# Patient Record
Sex: Male | Born: 1955 | Race: Black or African American | Hispanic: No | Marital: Single | Smoking: Never smoker
Health system: Southern US, Community
[De-identification: ages and names within clinical notes are randomized; demographics above are authoritative.]

## PROBLEM LIST (undated history)

## (undated) DIAGNOSIS — G4733 Obstructive sleep apnea (adult) (pediatric): Secondary | ICD-10-CM

## (undated) DIAGNOSIS — E785 Hyperlipidemia, unspecified: Secondary | ICD-10-CM

## (undated) DIAGNOSIS — E039 Hypothyroidism, unspecified: Secondary | ICD-10-CM

## (undated) DIAGNOSIS — D6489 Other specified anemias: Secondary | ICD-10-CM

## (undated) DIAGNOSIS — S065XAA Traumatic subdural hemorrhage with loss of consciousness status unknown, initial encounter: Secondary | ICD-10-CM

## (undated) DIAGNOSIS — I219 Acute myocardial infarction, unspecified: Secondary | ICD-10-CM

## (undated) DIAGNOSIS — IMO0001 Reserved for inherently not codable concepts without codable children: Secondary | ICD-10-CM

## (undated) DIAGNOSIS — M545 Low back pain, unspecified: Secondary | ICD-10-CM

## (undated) DIAGNOSIS — I10 Essential (primary) hypertension: Secondary | ICD-10-CM

## (undated) DIAGNOSIS — H409 Unspecified glaucoma: Secondary | ICD-10-CM

## (undated) DIAGNOSIS — E11319 Type 2 diabetes mellitus with unspecified diabetic retinopathy without macular edema: Secondary | ICD-10-CM

## (undated) DIAGNOSIS — E1140 Type 2 diabetes mellitus with diabetic neuropathy, unspecified: Secondary | ICD-10-CM

## (undated) DIAGNOSIS — Z992 Dependence on renal dialysis: Secondary | ICD-10-CM

## (undated) DIAGNOSIS — Z8739 Personal history of other diseases of the musculoskeletal system and connective tissue: Secondary | ICD-10-CM

## (undated) DIAGNOSIS — I4891 Unspecified atrial fibrillation: Secondary | ICD-10-CM

## (undated) DIAGNOSIS — M1990 Unspecified osteoarthritis, unspecified site: Secondary | ICD-10-CM

## (undated) DIAGNOSIS — G8929 Other chronic pain: Secondary | ICD-10-CM

## (undated) DIAGNOSIS — I509 Heart failure, unspecified: Secondary | ICD-10-CM

## (undated) DIAGNOSIS — N186 End stage renal disease: Secondary | ICD-10-CM

## (undated) DIAGNOSIS — J309 Allergic rhinitis, unspecified: Secondary | ICD-10-CM

## (undated) DIAGNOSIS — I639 Cerebral infarction, unspecified: Secondary | ICD-10-CM

## (undated) DIAGNOSIS — I272 Pulmonary hypertension, unspecified: Secondary | ICD-10-CM

## (undated) DIAGNOSIS — I499 Cardiac arrhythmia, unspecified: Secondary | ICD-10-CM

## (undated) DIAGNOSIS — Z531 Procedure and treatment not carried out because of patient's decision for reasons of belief and group pressure: Secondary | ICD-10-CM

## (undated) DIAGNOSIS — S065X9A Traumatic subdural hemorrhage with loss of consciousness of unspecified duration, initial encounter: Secondary | ICD-10-CM

## (undated) DIAGNOSIS — K219 Gastro-esophageal reflux disease without esophagitis: Secondary | ICD-10-CM

## (undated) HISTORY — DX: Obstructive sleep apnea (adult) (pediatric): G47.33

## (undated) HISTORY — DX: Heart failure, unspecified: I50.9

## (undated) HISTORY — DX: Morbid (severe) obesity due to excess calories: E66.01

## (undated) HISTORY — PX: JOINT REPLACEMENT: SHX530

## (undated) HISTORY — DX: Cerebral infarction, unspecified: I63.9

## (undated) HISTORY — DX: Allergic rhinitis, unspecified: J30.9

## (undated) HISTORY — DX: Type 2 diabetes mellitus with unspecified diabetic retinopathy without macular edema: E11.319

## (undated) HISTORY — DX: Other specified anemias: D64.89

## (undated) HISTORY — DX: Hyperlipidemia, unspecified: E78.5

## (undated) HISTORY — PX: CLOSED REDUCTION HIP DISLOCATION: SUR221

## (undated) HISTORY — DX: Unspecified glaucoma: H40.9

## (undated) HISTORY — DX: Unspecified osteoarthritis, unspecified site: M19.90

## (undated) HISTORY — DX: End stage renal disease: N18.6

## (undated) HISTORY — DX: Pulmonary hypertension, unspecified: I27.20

## (undated) HISTORY — DX: Unspecified atrial fibrillation: I48.91

## (undated) HISTORY — DX: Essential (primary) hypertension: I10

## (undated) HISTORY — PX: AV FISTULA PLACEMENT: SHX1204

## (undated) HISTORY — DX: Type 2 diabetes mellitus with diabetic neuropathy, unspecified: E11.40

## (undated) HISTORY — DX: Dependence on renal dialysis: Z99.2

---

## 1978-02-12 HISTORY — PX: TOTAL HIP ARTHROPLASTY: SHX124

## 2008-07-06 LAB — PROTIME-INR: INR: 2.2 — AB (ref 0.9–1.1)

## 2011-02-14 DIAGNOSIS — G4733 Obstructive sleep apnea (adult) (pediatric): Secondary | ICD-10-CM | POA: Diagnosis not present

## 2011-02-14 DIAGNOSIS — J449 Chronic obstructive pulmonary disease, unspecified: Secondary | ICD-10-CM | POA: Diagnosis not present

## 2011-02-14 DIAGNOSIS — E669 Obesity, unspecified: Secondary | ICD-10-CM | POA: Diagnosis not present

## 2011-02-14 DIAGNOSIS — J209 Acute bronchitis, unspecified: Secondary | ICD-10-CM | POA: Diagnosis not present

## 2011-02-15 DIAGNOSIS — Z7901 Long term (current) use of anticoagulants: Secondary | ICD-10-CM | POA: Diagnosis not present

## 2011-02-28 DIAGNOSIS — J9610 Chronic respiratory failure, unspecified whether with hypoxia or hypercapnia: Secondary | ICD-10-CM | POA: Diagnosis not present

## 2011-02-28 DIAGNOSIS — J449 Chronic obstructive pulmonary disease, unspecified: Secondary | ICD-10-CM | POA: Diagnosis not present

## 2011-02-28 DIAGNOSIS — J45909 Unspecified asthma, uncomplicated: Secondary | ICD-10-CM | POA: Diagnosis not present

## 2011-03-01 DIAGNOSIS — I509 Heart failure, unspecified: Secondary | ICD-10-CM | POA: Diagnosis not present

## 2011-03-01 DIAGNOSIS — Z7901 Long term (current) use of anticoagulants: Secondary | ICD-10-CM | POA: Diagnosis not present

## 2011-03-05 DIAGNOSIS — I4891 Unspecified atrial fibrillation: Secondary | ICD-10-CM | POA: Diagnosis not present

## 2011-03-05 DIAGNOSIS — I119 Hypertensive heart disease without heart failure: Secondary | ICD-10-CM | POA: Diagnosis not present

## 2011-03-05 DIAGNOSIS — R5383 Other fatigue: Secondary | ICD-10-CM | POA: Diagnosis not present

## 2011-03-05 DIAGNOSIS — I209 Angina pectoris, unspecified: Secondary | ICD-10-CM | POA: Diagnosis not present

## 2011-03-05 DIAGNOSIS — R5381 Other malaise: Secondary | ICD-10-CM | POA: Diagnosis not present

## 2011-03-05 DIAGNOSIS — I2510 Atherosclerotic heart disease of native coronary artery without angina pectoris: Secondary | ICD-10-CM | POA: Diagnosis not present

## 2011-03-05 DIAGNOSIS — R0602 Shortness of breath: Secondary | ICD-10-CM | POA: Diagnosis not present

## 2011-03-05 DIAGNOSIS — I509 Heart failure, unspecified: Secondary | ICD-10-CM | POA: Diagnosis not present

## 2011-03-07 DIAGNOSIS — I509 Heart failure, unspecified: Secondary | ICD-10-CM | POA: Diagnosis present

## 2011-03-07 DIAGNOSIS — E119 Type 2 diabetes mellitus without complications: Secondary | ICD-10-CM | POA: Diagnosis present

## 2011-03-07 DIAGNOSIS — I129 Hypertensive chronic kidney disease with stage 1 through stage 4 chronic kidney disease, or unspecified chronic kidney disease: Secondary | ICD-10-CM | POA: Diagnosis present

## 2011-03-07 DIAGNOSIS — E669 Obesity, unspecified: Secondary | ICD-10-CM | POA: Diagnosis present

## 2011-03-07 DIAGNOSIS — N189 Chronic kidney disease, unspecified: Secondary | ICD-10-CM | POA: Diagnosis present

## 2011-03-07 DIAGNOSIS — M069 Rheumatoid arthritis, unspecified: Secondary | ICD-10-CM | POA: Diagnosis present

## 2011-03-07 DIAGNOSIS — E7800 Pure hypercholesterolemia, unspecified: Secondary | ICD-10-CM | POA: Diagnosis present

## 2011-03-07 DIAGNOSIS — I4891 Unspecified atrial fibrillation: Secondary | ICD-10-CM | POA: Diagnosis not present

## 2011-03-07 DIAGNOSIS — I5032 Chronic diastolic (congestive) heart failure: Secondary | ICD-10-CM | POA: Diagnosis present

## 2011-03-07 DIAGNOSIS — Z96649 Presence of unspecified artificial hip joint: Secondary | ICD-10-CM | POA: Diagnosis not present

## 2011-03-15 DIAGNOSIS — Z7901 Long term (current) use of anticoagulants: Secondary | ICD-10-CM | POA: Diagnosis not present

## 2011-03-29 DIAGNOSIS — I4891 Unspecified atrial fibrillation: Secondary | ICD-10-CM | POA: Diagnosis not present

## 2011-03-29 DIAGNOSIS — Z7901 Long term (current) use of anticoagulants: Secondary | ICD-10-CM | POA: Diagnosis not present

## 2011-04-12 DIAGNOSIS — Z7901 Long term (current) use of anticoagulants: Secondary | ICD-10-CM | POA: Diagnosis not present

## 2011-04-12 DIAGNOSIS — I4891 Unspecified atrial fibrillation: Secondary | ICD-10-CM | POA: Diagnosis not present

## 2011-04-25 DIAGNOSIS — I4891 Unspecified atrial fibrillation: Secondary | ICD-10-CM | POA: Diagnosis not present

## 2011-04-26 DIAGNOSIS — I509 Heart failure, unspecified: Secondary | ICD-10-CM | POA: Diagnosis not present

## 2011-04-26 DIAGNOSIS — I4891 Unspecified atrial fibrillation: Secondary | ICD-10-CM | POA: Diagnosis not present

## 2011-04-26 DIAGNOSIS — J811 Chronic pulmonary edema: Secondary | ICD-10-CM | POA: Diagnosis not present

## 2011-04-26 DIAGNOSIS — I999 Unspecified disorder of circulatory system: Secondary | ICD-10-CM | POA: Diagnosis not present

## 2011-04-26 DIAGNOSIS — I517 Cardiomegaly: Secondary | ICD-10-CM | POA: Diagnosis not present

## 2011-04-26 DIAGNOSIS — R9431 Abnormal electrocardiogram [ECG] [EKG]: Secondary | ICD-10-CM | POA: Diagnosis not present

## 2011-04-27 DIAGNOSIS — I999 Unspecified disorder of circulatory system: Secondary | ICD-10-CM | POA: Diagnosis not present

## 2011-04-27 DIAGNOSIS — I259 Chronic ischemic heart disease, unspecified: Secondary | ICD-10-CM | POA: Diagnosis not present

## 2011-05-10 DIAGNOSIS — Z7901 Long term (current) use of anticoagulants: Secondary | ICD-10-CM | POA: Diagnosis not present

## 2011-05-14 DIAGNOSIS — I509 Heart failure, unspecified: Secondary | ICD-10-CM | POA: Diagnosis not present

## 2011-05-14 DIAGNOSIS — J209 Acute bronchitis, unspecified: Secondary | ICD-10-CM | POA: Diagnosis not present

## 2011-05-15 DIAGNOSIS — R059 Cough, unspecified: Secondary | ICD-10-CM | POA: Diagnosis not present

## 2011-05-15 DIAGNOSIS — R05 Cough: Secondary | ICD-10-CM | POA: Diagnosis not present

## 2011-05-16 DIAGNOSIS — N183 Chronic kidney disease, stage 3 unspecified: Secondary | ICD-10-CM | POA: Diagnosis not present

## 2011-05-16 DIAGNOSIS — E119 Type 2 diabetes mellitus without complications: Secondary | ICD-10-CM | POA: Diagnosis not present

## 2011-05-16 DIAGNOSIS — I10 Essential (primary) hypertension: Secondary | ICD-10-CM | POA: Diagnosis not present

## 2011-05-16 DIAGNOSIS — I509 Heart failure, unspecified: Secondary | ICD-10-CM | POA: Diagnosis not present

## 2011-05-16 DIAGNOSIS — I4891 Unspecified atrial fibrillation: Secondary | ICD-10-CM | POA: Diagnosis not present

## 2011-05-17 DIAGNOSIS — I10 Essential (primary) hypertension: Secondary | ICD-10-CM | POA: Diagnosis not present

## 2011-05-17 DIAGNOSIS — I4891 Unspecified atrial fibrillation: Secondary | ICD-10-CM | POA: Diagnosis not present

## 2011-05-23 DIAGNOSIS — I4891 Unspecified atrial fibrillation: Secondary | ICD-10-CM | POA: Diagnosis not present

## 2011-05-23 DIAGNOSIS — Z7901 Long term (current) use of anticoagulants: Secondary | ICD-10-CM | POA: Diagnosis not present

## 2011-06-07 DIAGNOSIS — Z7901 Long term (current) use of anticoagulants: Secondary | ICD-10-CM | POA: Diagnosis not present

## 2011-06-27 DIAGNOSIS — Z7901 Long term (current) use of anticoagulants: Secondary | ICD-10-CM | POA: Diagnosis not present

## 2011-07-31 DIAGNOSIS — H109 Unspecified conjunctivitis: Secondary | ICD-10-CM | POA: Diagnosis not present

## 2011-07-31 DIAGNOSIS — H4010X0 Unspecified open-angle glaucoma, stage unspecified: Secondary | ICD-10-CM | POA: Diagnosis not present

## 2011-08-14 DIAGNOSIS — H4010X0 Unspecified open-angle glaucoma, stage unspecified: Secondary | ICD-10-CM | POA: Diagnosis not present

## 2011-08-14 DIAGNOSIS — H109 Unspecified conjunctivitis: Secondary | ICD-10-CM | POA: Diagnosis not present

## 2011-08-14 DIAGNOSIS — L708 Other acne: Secondary | ICD-10-CM | POA: Diagnosis not present

## 2011-08-15 DIAGNOSIS — I4891 Unspecified atrial fibrillation: Secondary | ICD-10-CM | POA: Diagnosis not present

## 2011-09-05 DIAGNOSIS — I4891 Unspecified atrial fibrillation: Secondary | ICD-10-CM | POA: Diagnosis not present

## 2011-09-05 DIAGNOSIS — Z7901 Long term (current) use of anticoagulants: Secondary | ICD-10-CM | POA: Diagnosis not present

## 2011-09-26 DIAGNOSIS — Z7901 Long term (current) use of anticoagulants: Secondary | ICD-10-CM | POA: Diagnosis not present

## 2011-10-04 DIAGNOSIS — I4891 Unspecified atrial fibrillation: Secondary | ICD-10-CM | POA: Diagnosis not present

## 2011-10-04 DIAGNOSIS — I509 Heart failure, unspecified: Secondary | ICD-10-CM | POA: Diagnosis not present

## 2011-10-05 DIAGNOSIS — N183 Chronic kidney disease, stage 3 unspecified: Secondary | ICD-10-CM | POA: Diagnosis not present

## 2011-10-05 DIAGNOSIS — E1129 Type 2 diabetes mellitus with other diabetic kidney complication: Secondary | ICD-10-CM | POA: Diagnosis not present

## 2011-10-05 DIAGNOSIS — I10 Essential (primary) hypertension: Secondary | ICD-10-CM | POA: Diagnosis not present

## 2011-10-05 DIAGNOSIS — I4891 Unspecified atrial fibrillation: Secondary | ICD-10-CM | POA: Diagnosis not present

## 2011-10-11 DIAGNOSIS — E8809 Other disorders of plasma-protein metabolism, not elsewhere classified: Secondary | ICD-10-CM | POA: Diagnosis not present

## 2011-10-11 DIAGNOSIS — N183 Chronic kidney disease, stage 3 unspecified: Secondary | ICD-10-CM | POA: Diagnosis not present

## 2011-10-11 DIAGNOSIS — I10 Essential (primary) hypertension: Secondary | ICD-10-CM | POA: Diagnosis not present

## 2011-10-11 DIAGNOSIS — E119 Type 2 diabetes mellitus without complications: Secondary | ICD-10-CM | POA: Diagnosis not present

## 2011-10-11 DIAGNOSIS — N179 Acute kidney failure, unspecified: Secondary | ICD-10-CM | POA: Diagnosis not present

## 2011-10-11 DIAGNOSIS — I509 Heart failure, unspecified: Secondary | ICD-10-CM | POA: Diagnosis not present

## 2011-10-17 DIAGNOSIS — I10 Essential (primary) hypertension: Secondary | ICD-10-CM | POA: Diagnosis not present

## 2011-10-17 DIAGNOSIS — E119 Type 2 diabetes mellitus without complications: Secondary | ICD-10-CM | POA: Diagnosis not present

## 2011-10-17 DIAGNOSIS — I509 Heart failure, unspecified: Secondary | ICD-10-CM | POA: Diagnosis not present

## 2011-10-17 DIAGNOSIS — I4891 Unspecified atrial fibrillation: Secondary | ICD-10-CM | POA: Diagnosis not present

## 2011-10-17 DIAGNOSIS — N183 Chronic kidney disease, stage 3 unspecified: Secondary | ICD-10-CM | POA: Diagnosis not present

## 2011-10-17 DIAGNOSIS — N179 Acute kidney failure, unspecified: Secondary | ICD-10-CM | POA: Diagnosis not present

## 2011-10-23 DIAGNOSIS — I4891 Unspecified atrial fibrillation: Secondary | ICD-10-CM | POA: Diagnosis not present

## 2011-10-23 DIAGNOSIS — I509 Heart failure, unspecified: Secondary | ICD-10-CM | POA: Diagnosis not present

## 2011-10-25 DIAGNOSIS — N183 Chronic kidney disease, stage 3 unspecified: Secondary | ICD-10-CM | POA: Diagnosis not present

## 2011-10-25 DIAGNOSIS — I129 Hypertensive chronic kidney disease with stage 1 through stage 4 chronic kidney disease, or unspecified chronic kidney disease: Secondary | ICD-10-CM | POA: Diagnosis not present

## 2011-10-25 DIAGNOSIS — N281 Cyst of kidney, acquired: Secondary | ICD-10-CM | POA: Diagnosis not present

## 2011-10-26 DIAGNOSIS — E119 Type 2 diabetes mellitus without complications: Secondary | ICD-10-CM | POA: Diagnosis not present

## 2011-10-26 DIAGNOSIS — H4010X0 Unspecified open-angle glaucoma, stage unspecified: Secondary | ICD-10-CM | POA: Diagnosis not present

## 2011-11-07 DIAGNOSIS — I4891 Unspecified atrial fibrillation: Secondary | ICD-10-CM | POA: Diagnosis not present

## 2011-11-14 DIAGNOSIS — I509 Heart failure, unspecified: Secondary | ICD-10-CM | POA: Diagnosis not present

## 2011-11-14 DIAGNOSIS — N183 Chronic kidney disease, stage 3 unspecified: Secondary | ICD-10-CM | POA: Diagnosis not present

## 2011-11-14 DIAGNOSIS — R809 Proteinuria, unspecified: Secondary | ICD-10-CM | POA: Diagnosis not present

## 2011-11-14 DIAGNOSIS — E119 Type 2 diabetes mellitus without complications: Secondary | ICD-10-CM | POA: Diagnosis not present

## 2011-11-14 DIAGNOSIS — I4891 Unspecified atrial fibrillation: Secondary | ICD-10-CM | POA: Diagnosis not present

## 2011-11-14 DIAGNOSIS — M109 Gout, unspecified: Secondary | ICD-10-CM | POA: Diagnosis not present

## 2011-11-14 DIAGNOSIS — I10 Essential (primary) hypertension: Secondary | ICD-10-CM | POA: Diagnosis not present

## 2011-12-27 DIAGNOSIS — I509 Heart failure, unspecified: Secondary | ICD-10-CM | POA: Diagnosis not present

## 2011-12-27 DIAGNOSIS — N179 Acute kidney failure, unspecified: Secondary | ICD-10-CM | POA: Diagnosis not present

## 2011-12-27 DIAGNOSIS — I10 Essential (primary) hypertension: Secondary | ICD-10-CM | POA: Diagnosis not present

## 2011-12-27 DIAGNOSIS — E119 Type 2 diabetes mellitus without complications: Secondary | ICD-10-CM | POA: Diagnosis not present

## 2012-01-08 DIAGNOSIS — Z5181 Encounter for therapeutic drug level monitoring: Secondary | ICD-10-CM | POA: Diagnosis not present

## 2012-01-08 DIAGNOSIS — I4891 Unspecified atrial fibrillation: Secondary | ICD-10-CM | POA: Diagnosis not present

## 2012-01-08 DIAGNOSIS — Z7901 Long term (current) use of anticoagulants: Secondary | ICD-10-CM | POA: Diagnosis not present

## 2012-01-23 DIAGNOSIS — E669 Obesity, unspecified: Secondary | ICD-10-CM | POA: Diagnosis not present

## 2012-01-23 DIAGNOSIS — Z531 Procedure and treatment not carried out because of patient's decision for reasons of belief and group pressure: Secondary | ICD-10-CM | POA: Diagnosis not present

## 2012-02-05 DIAGNOSIS — I4891 Unspecified atrial fibrillation: Secondary | ICD-10-CM | POA: Diagnosis not present

## 2012-02-05 DIAGNOSIS — Z7901 Long term (current) use of anticoagulants: Secondary | ICD-10-CM | POA: Diagnosis not present

## 2012-02-11 DIAGNOSIS — E119 Type 2 diabetes mellitus without complications: Secondary | ICD-10-CM | POA: Diagnosis not present

## 2012-02-11 DIAGNOSIS — I509 Heart failure, unspecified: Secondary | ICD-10-CM | POA: Diagnosis not present

## 2012-02-11 DIAGNOSIS — R809 Proteinuria, unspecified: Secondary | ICD-10-CM | POA: Diagnosis not present

## 2012-02-11 DIAGNOSIS — I10 Essential (primary) hypertension: Secondary | ICD-10-CM | POA: Diagnosis not present

## 2012-02-11 DIAGNOSIS — N184 Chronic kidney disease, stage 4 (severe): Secondary | ICD-10-CM | POA: Diagnosis not present

## 2012-02-11 DIAGNOSIS — N183 Chronic kidney disease, stage 3 unspecified: Secondary | ICD-10-CM | POA: Diagnosis not present

## 2012-02-12 DIAGNOSIS — E785 Hyperlipidemia, unspecified: Secondary | ICD-10-CM | POA: Diagnosis not present

## 2012-02-12 DIAGNOSIS — N183 Chronic kidney disease, stage 3 unspecified: Secondary | ICD-10-CM | POA: Diagnosis not present

## 2012-02-12 DIAGNOSIS — K219 Gastro-esophageal reflux disease without esophagitis: Secondary | ICD-10-CM | POA: Diagnosis not present

## 2012-02-12 DIAGNOSIS — E1129 Type 2 diabetes mellitus with other diabetic kidney complication: Secondary | ICD-10-CM | POA: Diagnosis not present

## 2012-02-12 DIAGNOSIS — M109 Gout, unspecified: Secondary | ICD-10-CM | POA: Diagnosis not present

## 2012-02-14 DIAGNOSIS — N179 Acute kidney failure, unspecified: Secondary | ICD-10-CM | POA: Diagnosis not present

## 2012-02-14 DIAGNOSIS — I509 Heart failure, unspecified: Secondary | ICD-10-CM | POA: Diagnosis not present

## 2012-02-14 DIAGNOSIS — I10 Essential (primary) hypertension: Secondary | ICD-10-CM | POA: Diagnosis not present

## 2012-02-14 DIAGNOSIS — E119 Type 2 diabetes mellitus without complications: Secondary | ICD-10-CM | POA: Diagnosis not present

## 2012-02-27 DIAGNOSIS — Z1211 Encounter for screening for malignant neoplasm of colon: Secondary | ICD-10-CM | POA: Diagnosis not present

## 2012-02-27 DIAGNOSIS — I4891 Unspecified atrial fibrillation: Secondary | ICD-10-CM | POA: Diagnosis not present

## 2012-02-27 DIAGNOSIS — Z7901 Long term (current) use of anticoagulants: Secondary | ICD-10-CM | POA: Diagnosis not present

## 2012-02-27 DIAGNOSIS — K219 Gastro-esophageal reflux disease without esophagitis: Secondary | ICD-10-CM | POA: Diagnosis not present

## 2012-03-08 DIAGNOSIS — I4891 Unspecified atrial fibrillation: Secondary | ICD-10-CM | POA: Diagnosis not present

## 2012-03-08 DIAGNOSIS — E1129 Type 2 diabetes mellitus with other diabetic kidney complication: Secondary | ICD-10-CM | POA: Diagnosis not present

## 2012-03-08 DIAGNOSIS — R3129 Other microscopic hematuria: Secondary | ICD-10-CM | POA: Diagnosis not present

## 2012-03-08 DIAGNOSIS — I2789 Other specified pulmonary heart diseases: Secondary | ICD-10-CM | POA: Diagnosis not present

## 2012-03-08 DIAGNOSIS — R609 Edema, unspecified: Secondary | ICD-10-CM | POA: Diagnosis not present

## 2012-03-08 DIAGNOSIS — F329 Major depressive disorder, single episode, unspecified: Secondary | ICD-10-CM | POA: Diagnosis not present

## 2012-03-08 DIAGNOSIS — N183 Chronic kidney disease, stage 3 unspecified: Secondary | ICD-10-CM | POA: Diagnosis not present

## 2012-03-12 DIAGNOSIS — I4891 Unspecified atrial fibrillation: Secondary | ICD-10-CM | POA: Diagnosis not present

## 2012-03-12 DIAGNOSIS — Z7901 Long term (current) use of anticoagulants: Secondary | ICD-10-CM | POA: Diagnosis not present

## 2012-03-18 DIAGNOSIS — R0602 Shortness of breath: Secondary | ICD-10-CM | POA: Diagnosis not present

## 2012-03-18 DIAGNOSIS — I4891 Unspecified atrial fibrillation: Secondary | ICD-10-CM | POA: Diagnosis not present

## 2012-03-18 DIAGNOSIS — I119 Hypertensive heart disease without heart failure: Secondary | ICD-10-CM | POA: Diagnosis not present

## 2012-03-18 DIAGNOSIS — I509 Heart failure, unspecified: Secondary | ICD-10-CM | POA: Diagnosis not present

## 2012-03-18 DIAGNOSIS — R5381 Other malaise: Secondary | ICD-10-CM | POA: Diagnosis not present

## 2012-03-18 DIAGNOSIS — Z7901 Long term (current) use of anticoagulants: Secondary | ICD-10-CM | POA: Diagnosis not present

## 2012-03-18 DIAGNOSIS — I2510 Atherosclerotic heart disease of native coronary artery without angina pectoris: Secondary | ICD-10-CM | POA: Diagnosis not present

## 2012-03-26 DIAGNOSIS — I509 Heart failure, unspecified: Secondary | ICD-10-CM | POA: Diagnosis not present

## 2012-03-26 DIAGNOSIS — N189 Chronic kidney disease, unspecified: Secondary | ICD-10-CM | POA: Diagnosis not present

## 2012-03-26 DIAGNOSIS — I129 Hypertensive chronic kidney disease with stage 1 through stage 4 chronic kidney disease, or unspecified chronic kidney disease: Secondary | ICD-10-CM | POA: Diagnosis not present

## 2012-03-26 DIAGNOSIS — R0609 Other forms of dyspnea: Secondary | ICD-10-CM | POA: Diagnosis not present

## 2012-03-26 DIAGNOSIS — J90 Pleural effusion, not elsewhere classified: Secondary | ICD-10-CM | POA: Diagnosis not present

## 2012-03-26 DIAGNOSIS — Z7901 Long term (current) use of anticoagulants: Secondary | ICD-10-CM | POA: Diagnosis not present

## 2012-03-27 DIAGNOSIS — I5031 Acute diastolic (congestive) heart failure: Secondary | ICD-10-CM | POA: Diagnosis not present

## 2012-03-27 DIAGNOSIS — J9610 Chronic respiratory failure, unspecified whether with hypoxia or hypercapnia: Secondary | ICD-10-CM | POA: Diagnosis not present

## 2012-03-27 DIAGNOSIS — N178 Other acute kidney failure: Secondary | ICD-10-CM | POA: Diagnosis not present

## 2012-03-27 DIAGNOSIS — R34 Anuria and oliguria: Secondary | ICD-10-CM | POA: Diagnosis not present

## 2012-03-27 DIAGNOSIS — I4891 Unspecified atrial fibrillation: Secondary | ICD-10-CM | POA: Diagnosis not present

## 2012-03-27 DIAGNOSIS — J9600 Acute respiratory failure, unspecified whether with hypoxia or hypercapnia: Secondary | ICD-10-CM | POA: Diagnosis not present

## 2012-03-27 DIAGNOSIS — N189 Chronic kidney disease, unspecified: Secondary | ICD-10-CM | POA: Diagnosis not present

## 2012-03-27 DIAGNOSIS — N281 Cyst of kidney, acquired: Secondary | ICD-10-CM | POA: Diagnosis not present

## 2012-03-28 DIAGNOSIS — I5020 Unspecified systolic (congestive) heart failure: Secondary | ICD-10-CM | POA: Diagnosis not present

## 2012-03-28 DIAGNOSIS — I270 Primary pulmonary hypertension: Secondary | ICD-10-CM | POA: Diagnosis not present

## 2012-03-28 DIAGNOSIS — N179 Acute kidney failure, unspecified: Secondary | ICD-10-CM | POA: Diagnosis not present

## 2012-03-28 DIAGNOSIS — J9600 Acute respiratory failure, unspecified whether with hypoxia or hypercapnia: Secondary | ICD-10-CM | POA: Diagnosis not present

## 2012-03-28 DIAGNOSIS — N178 Other acute kidney failure: Secondary | ICD-10-CM | POA: Diagnosis not present

## 2012-03-28 DIAGNOSIS — I5031 Acute diastolic (congestive) heart failure: Secondary | ICD-10-CM | POA: Diagnosis not present

## 2012-03-28 DIAGNOSIS — I4891 Unspecified atrial fibrillation: Secondary | ICD-10-CM | POA: Diagnosis not present

## 2012-03-28 DIAGNOSIS — I509 Heart failure, unspecified: Secondary | ICD-10-CM | POA: Diagnosis not present

## 2012-03-28 DIAGNOSIS — R0602 Shortness of breath: Secondary | ICD-10-CM | POA: Diagnosis not present

## 2012-03-28 DIAGNOSIS — N183 Chronic kidney disease, stage 3 unspecified: Secondary | ICD-10-CM | POA: Diagnosis not present

## 2012-03-28 DIAGNOSIS — J9610 Chronic respiratory failure, unspecified whether with hypoxia or hypercapnia: Secondary | ICD-10-CM | POA: Diagnosis not present

## 2012-03-29 DIAGNOSIS — I270 Primary pulmonary hypertension: Secondary | ICD-10-CM | POA: Diagnosis not present

## 2012-03-29 DIAGNOSIS — J9600 Acute respiratory failure, unspecified whether with hypoxia or hypercapnia: Secondary | ICD-10-CM | POA: Diagnosis not present

## 2012-03-29 DIAGNOSIS — I5033 Acute on chronic diastolic (congestive) heart failure: Secondary | ICD-10-CM | POA: Diagnosis not present

## 2012-03-29 DIAGNOSIS — N183 Chronic kidney disease, stage 3 unspecified: Secondary | ICD-10-CM | POA: Diagnosis not present

## 2012-03-29 DIAGNOSIS — G4730 Sleep apnea, unspecified: Secondary | ICD-10-CM | POA: Diagnosis not present

## 2012-03-29 DIAGNOSIS — I4891 Unspecified atrial fibrillation: Secondary | ICD-10-CM | POA: Diagnosis not present

## 2012-03-29 DIAGNOSIS — I509 Heart failure, unspecified: Secondary | ICD-10-CM | POA: Diagnosis not present

## 2012-03-29 DIAGNOSIS — E1129 Type 2 diabetes mellitus with other diabetic kidney complication: Secondary | ICD-10-CM | POA: Diagnosis not present

## 2012-03-29 DIAGNOSIS — I5020 Unspecified systolic (congestive) heart failure: Secondary | ICD-10-CM | POA: Diagnosis not present

## 2012-03-29 DIAGNOSIS — J9620 Acute and chronic respiratory failure, unspecified whether with hypoxia or hypercapnia: Secondary | ICD-10-CM | POA: Diagnosis not present

## 2012-03-29 DIAGNOSIS — N179 Acute kidney failure, unspecified: Secondary | ICD-10-CM | POA: Diagnosis not present

## 2012-03-30 DIAGNOSIS — I5031 Acute diastolic (congestive) heart failure: Secondary | ICD-10-CM | POA: Diagnosis not present

## 2012-03-30 DIAGNOSIS — N178 Other acute kidney failure: Secondary | ICD-10-CM | POA: Diagnosis not present

## 2012-03-30 DIAGNOSIS — I5033 Acute on chronic diastolic (congestive) heart failure: Secondary | ICD-10-CM | POA: Diagnosis not present

## 2012-03-30 DIAGNOSIS — G4730 Sleep apnea, unspecified: Secondary | ICD-10-CM | POA: Diagnosis not present

## 2012-03-30 DIAGNOSIS — N179 Acute kidney failure, unspecified: Secondary | ICD-10-CM | POA: Diagnosis not present

## 2012-03-30 DIAGNOSIS — I509 Heart failure, unspecified: Secondary | ICD-10-CM | POA: Diagnosis not present

## 2012-03-30 DIAGNOSIS — N183 Chronic kidney disease, stage 3 unspecified: Secondary | ICD-10-CM | POA: Diagnosis not present

## 2012-03-30 DIAGNOSIS — E1129 Type 2 diabetes mellitus with other diabetic kidney complication: Secondary | ICD-10-CM | POA: Diagnosis not present

## 2012-03-30 DIAGNOSIS — R0989 Other specified symptoms and signs involving the circulatory and respiratory systems: Secondary | ICD-10-CM | POA: Diagnosis not present

## 2012-03-30 DIAGNOSIS — J9620 Acute and chronic respiratory failure, unspecified whether with hypoxia or hypercapnia: Secondary | ICD-10-CM | POA: Diagnosis not present

## 2012-03-30 DIAGNOSIS — R0609 Other forms of dyspnea: Secondary | ICD-10-CM | POA: Diagnosis not present

## 2012-03-30 DIAGNOSIS — I4891 Unspecified atrial fibrillation: Secondary | ICD-10-CM | POA: Diagnosis not present

## 2012-03-30 DIAGNOSIS — J9600 Acute respiratory failure, unspecified whether with hypoxia or hypercapnia: Secondary | ICD-10-CM | POA: Diagnosis not present

## 2012-03-31 DIAGNOSIS — I509 Heart failure, unspecified: Secondary | ICD-10-CM | POA: Diagnosis not present

## 2012-03-31 DIAGNOSIS — I5031 Acute diastolic (congestive) heart failure: Secondary | ICD-10-CM | POA: Diagnosis not present

## 2012-03-31 DIAGNOSIS — I5033 Acute on chronic diastolic (congestive) heart failure: Secondary | ICD-10-CM | POA: Diagnosis not present

## 2012-03-31 DIAGNOSIS — N183 Chronic kidney disease, stage 3 unspecified: Secondary | ICD-10-CM | POA: Diagnosis not present

## 2012-03-31 DIAGNOSIS — J9610 Chronic respiratory failure, unspecified whether with hypoxia or hypercapnia: Secondary | ICD-10-CM | POA: Diagnosis not present

## 2012-03-31 DIAGNOSIS — I4891 Unspecified atrial fibrillation: Secondary | ICD-10-CM | POA: Diagnosis not present

## 2012-03-31 DIAGNOSIS — N179 Acute kidney failure, unspecified: Secondary | ICD-10-CM | POA: Diagnosis not present

## 2012-03-31 DIAGNOSIS — R0609 Other forms of dyspnea: Secondary | ICD-10-CM | POA: Diagnosis not present

## 2012-03-31 DIAGNOSIS — G4730 Sleep apnea, unspecified: Secondary | ICD-10-CM | POA: Diagnosis not present

## 2012-03-31 DIAGNOSIS — I517 Cardiomegaly: Secondary | ICD-10-CM | POA: Diagnosis not present

## 2012-04-01 DIAGNOSIS — I4891 Unspecified atrial fibrillation: Secondary | ICD-10-CM | POA: Diagnosis not present

## 2012-04-01 DIAGNOSIS — N179 Acute kidney failure, unspecified: Secondary | ICD-10-CM | POA: Diagnosis not present

## 2012-04-01 DIAGNOSIS — J9610 Chronic respiratory failure, unspecified whether with hypoxia or hypercapnia: Secondary | ICD-10-CM | POA: Diagnosis not present

## 2012-04-01 DIAGNOSIS — N183 Chronic kidney disease, stage 3 unspecified: Secondary | ICD-10-CM | POA: Diagnosis not present

## 2012-04-01 DIAGNOSIS — R0989 Other specified symptoms and signs involving the circulatory and respiratory systems: Secondary | ICD-10-CM | POA: Diagnosis not present

## 2012-04-01 DIAGNOSIS — I5031 Acute diastolic (congestive) heart failure: Secondary | ICD-10-CM | POA: Diagnosis not present

## 2012-04-01 DIAGNOSIS — I509 Heart failure, unspecified: Secondary | ICD-10-CM | POA: Diagnosis not present

## 2012-04-01 DIAGNOSIS — R0609 Other forms of dyspnea: Secondary | ICD-10-CM | POA: Diagnosis not present

## 2012-04-01 DIAGNOSIS — R0602 Shortness of breath: Secondary | ICD-10-CM | POA: Diagnosis not present

## 2012-04-01 DIAGNOSIS — I517 Cardiomegaly: Secondary | ICD-10-CM | POA: Diagnosis not present

## 2012-04-01 DIAGNOSIS — Z452 Encounter for adjustment and management of vascular access device: Secondary | ICD-10-CM | POA: Diagnosis not present

## 2012-04-02 DIAGNOSIS — R0602 Shortness of breath: Secondary | ICD-10-CM | POA: Diagnosis not present

## 2012-04-02 DIAGNOSIS — I4891 Unspecified atrial fibrillation: Secondary | ICD-10-CM | POA: Diagnosis not present

## 2012-04-02 DIAGNOSIS — N183 Chronic kidney disease, stage 3 unspecified: Secondary | ICD-10-CM | POA: Diagnosis not present

## 2012-04-02 DIAGNOSIS — J9610 Chronic respiratory failure, unspecified whether with hypoxia or hypercapnia: Secondary | ICD-10-CM | POA: Diagnosis not present

## 2012-04-02 DIAGNOSIS — I509 Heart failure, unspecified: Secondary | ICD-10-CM | POA: Diagnosis not present

## 2012-04-02 DIAGNOSIS — I5031 Acute diastolic (congestive) heart failure: Secondary | ICD-10-CM | POA: Diagnosis not present

## 2012-04-02 DIAGNOSIS — N179 Acute kidney failure, unspecified: Secondary | ICD-10-CM | POA: Diagnosis not present

## 2012-04-03 DIAGNOSIS — N183 Chronic kidney disease, stage 3 unspecified: Secondary | ICD-10-CM | POA: Diagnosis not present

## 2012-04-03 DIAGNOSIS — I5031 Acute diastolic (congestive) heart failure: Secondary | ICD-10-CM | POA: Diagnosis not present

## 2012-04-03 DIAGNOSIS — J9610 Chronic respiratory failure, unspecified whether with hypoxia or hypercapnia: Secondary | ICD-10-CM | POA: Diagnosis not present

## 2012-04-03 DIAGNOSIS — I509 Heart failure, unspecified: Secondary | ICD-10-CM | POA: Diagnosis not present

## 2012-04-03 DIAGNOSIS — I4891 Unspecified atrial fibrillation: Secondary | ICD-10-CM | POA: Diagnosis not present

## 2012-04-03 DIAGNOSIS — I5020 Unspecified systolic (congestive) heart failure: Secondary | ICD-10-CM | POA: Diagnosis not present

## 2012-04-03 DIAGNOSIS — N179 Acute kidney failure, unspecified: Secondary | ICD-10-CM | POA: Diagnosis not present

## 2012-04-03 DIAGNOSIS — I270 Primary pulmonary hypertension: Secondary | ICD-10-CM | POA: Diagnosis not present

## 2012-04-03 DIAGNOSIS — J9600 Acute respiratory failure, unspecified whether with hypoxia or hypercapnia: Secondary | ICD-10-CM | POA: Diagnosis not present

## 2012-04-04 DIAGNOSIS — J9610 Chronic respiratory failure, unspecified whether with hypoxia or hypercapnia: Secondary | ICD-10-CM | POA: Diagnosis not present

## 2012-04-04 DIAGNOSIS — I5031 Acute diastolic (congestive) heart failure: Secondary | ICD-10-CM | POA: Diagnosis not present

## 2012-04-04 DIAGNOSIS — N183 Chronic kidney disease, stage 3 unspecified: Secondary | ICD-10-CM | POA: Diagnosis not present

## 2012-04-04 DIAGNOSIS — I509 Heart failure, unspecified: Secondary | ICD-10-CM | POA: Diagnosis not present

## 2012-04-04 DIAGNOSIS — R0602 Shortness of breath: Secondary | ICD-10-CM | POA: Diagnosis not present

## 2012-04-04 DIAGNOSIS — I4891 Unspecified atrial fibrillation: Secondary | ICD-10-CM | POA: Diagnosis not present

## 2012-04-04 DIAGNOSIS — N179 Acute kidney failure, unspecified: Secondary | ICD-10-CM | POA: Diagnosis not present

## 2012-04-05 DIAGNOSIS — N183 Chronic kidney disease, stage 3 unspecified: Secondary | ICD-10-CM | POA: Diagnosis not present

## 2012-04-05 DIAGNOSIS — R0609 Other forms of dyspnea: Secondary | ICD-10-CM | POA: Diagnosis not present

## 2012-04-05 DIAGNOSIS — I509 Heart failure, unspecified: Secondary | ICD-10-CM | POA: Diagnosis not present

## 2012-04-05 DIAGNOSIS — R0989 Other specified symptoms and signs involving the circulatory and respiratory systems: Secondary | ICD-10-CM | POA: Diagnosis not present

## 2012-04-05 DIAGNOSIS — N179 Acute kidney failure, unspecified: Secondary | ICD-10-CM | POA: Diagnosis not present

## 2012-04-05 DIAGNOSIS — I4891 Unspecified atrial fibrillation: Secondary | ICD-10-CM | POA: Diagnosis not present

## 2012-04-05 DIAGNOSIS — I5032 Chronic diastolic (congestive) heart failure: Secondary | ICD-10-CM | POA: Diagnosis not present

## 2012-04-06 DIAGNOSIS — N183 Chronic kidney disease, stage 3 unspecified: Secondary | ICD-10-CM | POA: Diagnosis not present

## 2012-04-06 DIAGNOSIS — N179 Acute kidney failure, unspecified: Secondary | ICD-10-CM | POA: Diagnosis not present

## 2012-04-06 DIAGNOSIS — I5032 Chronic diastolic (congestive) heart failure: Secondary | ICD-10-CM | POA: Diagnosis not present

## 2012-04-06 DIAGNOSIS — I509 Heart failure, unspecified: Secondary | ICD-10-CM | POA: Diagnosis not present

## 2012-04-07 DIAGNOSIS — J9610 Chronic respiratory failure, unspecified whether with hypoxia or hypercapnia: Secondary | ICD-10-CM | POA: Diagnosis not present

## 2012-04-07 DIAGNOSIS — I509 Heart failure, unspecified: Secondary | ICD-10-CM | POA: Diagnosis not present

## 2012-04-07 DIAGNOSIS — I5033 Acute on chronic diastolic (congestive) heart failure: Secondary | ICD-10-CM | POA: Diagnosis not present

## 2012-04-07 DIAGNOSIS — I5031 Acute diastolic (congestive) heart failure: Secondary | ICD-10-CM | POA: Diagnosis not present

## 2012-04-07 DIAGNOSIS — I4891 Unspecified atrial fibrillation: Secondary | ICD-10-CM | POA: Diagnosis not present

## 2012-04-07 DIAGNOSIS — N179 Acute kidney failure, unspecified: Secondary | ICD-10-CM | POA: Diagnosis not present

## 2012-04-07 DIAGNOSIS — N183 Chronic kidney disease, stage 3 unspecified: Secondary | ICD-10-CM | POA: Diagnosis not present

## 2012-04-08 DIAGNOSIS — N183 Chronic kidney disease, stage 3 unspecified: Secondary | ICD-10-CM | POA: Diagnosis not present

## 2012-04-08 DIAGNOSIS — N179 Acute kidney failure, unspecified: Secondary | ICD-10-CM | POA: Diagnosis not present

## 2012-04-08 DIAGNOSIS — N186 End stage renal disease: Secondary | ICD-10-CM | POA: Diagnosis not present

## 2012-04-08 DIAGNOSIS — I517 Cardiomegaly: Secondary | ICD-10-CM | POA: Diagnosis not present

## 2012-04-08 DIAGNOSIS — J9610 Chronic respiratory failure, unspecified whether with hypoxia or hypercapnia: Secondary | ICD-10-CM | POA: Diagnosis not present

## 2012-04-08 DIAGNOSIS — I4891 Unspecified atrial fibrillation: Secondary | ICD-10-CM | POA: Diagnosis not present

## 2012-04-08 DIAGNOSIS — R0609 Other forms of dyspnea: Secondary | ICD-10-CM | POA: Diagnosis not present

## 2012-04-08 DIAGNOSIS — I509 Heart failure, unspecified: Secondary | ICD-10-CM | POA: Diagnosis not present

## 2012-04-08 DIAGNOSIS — I5031 Acute diastolic (congestive) heart failure: Secondary | ICD-10-CM | POA: Diagnosis not present

## 2012-04-09 DIAGNOSIS — I5033 Acute on chronic diastolic (congestive) heart failure: Secondary | ICD-10-CM | POA: Diagnosis not present

## 2012-04-09 DIAGNOSIS — I509 Heart failure, unspecified: Secondary | ICD-10-CM | POA: Diagnosis not present

## 2012-04-09 DIAGNOSIS — I279 Pulmonary heart disease, unspecified: Secondary | ICD-10-CM | POA: Diagnosis not present

## 2012-04-09 DIAGNOSIS — N183 Chronic kidney disease, stage 3 unspecified: Secondary | ICD-10-CM | POA: Diagnosis not present

## 2012-04-09 DIAGNOSIS — J9610 Chronic respiratory failure, unspecified whether with hypoxia or hypercapnia: Secondary | ICD-10-CM | POA: Diagnosis not present

## 2012-04-09 DIAGNOSIS — N179 Acute kidney failure, unspecified: Secondary | ICD-10-CM | POA: Diagnosis not present

## 2012-04-10 DIAGNOSIS — N179 Acute kidney failure, unspecified: Secondary | ICD-10-CM | POA: Diagnosis not present

## 2012-04-10 DIAGNOSIS — I5033 Acute on chronic diastolic (congestive) heart failure: Secondary | ICD-10-CM | POA: Diagnosis not present

## 2012-04-10 DIAGNOSIS — I509 Heart failure, unspecified: Secondary | ICD-10-CM | POA: Diagnosis not present

## 2012-04-10 DIAGNOSIS — J9610 Chronic respiratory failure, unspecified whether with hypoxia or hypercapnia: Secondary | ICD-10-CM | POA: Diagnosis not present

## 2012-04-10 DIAGNOSIS — I279 Pulmonary heart disease, unspecified: Secondary | ICD-10-CM | POA: Diagnosis not present

## 2012-04-10 DIAGNOSIS — N183 Chronic kidney disease, stage 3 unspecified: Secondary | ICD-10-CM | POA: Diagnosis not present

## 2012-04-11 DIAGNOSIS — I509 Heart failure, unspecified: Secondary | ICD-10-CM | POA: Diagnosis not present

## 2012-04-11 DIAGNOSIS — I279 Pulmonary heart disease, unspecified: Secondary | ICD-10-CM | POA: Diagnosis not present

## 2012-04-11 DIAGNOSIS — I5033 Acute on chronic diastolic (congestive) heart failure: Secondary | ICD-10-CM | POA: Diagnosis not present

## 2012-04-11 DIAGNOSIS — N183 Chronic kidney disease, stage 3 unspecified: Secondary | ICD-10-CM | POA: Diagnosis not present

## 2012-04-11 DIAGNOSIS — J9610 Chronic respiratory failure, unspecified whether with hypoxia or hypercapnia: Secondary | ICD-10-CM | POA: Diagnosis not present

## 2012-04-11 DIAGNOSIS — N179 Acute kidney failure, unspecified: Secondary | ICD-10-CM | POA: Diagnosis not present

## 2012-04-12 DIAGNOSIS — N183 Chronic kidney disease, stage 3 unspecified: Secondary | ICD-10-CM | POA: Diagnosis not present

## 2012-04-12 DIAGNOSIS — N179 Acute kidney failure, unspecified: Secondary | ICD-10-CM | POA: Diagnosis not present

## 2012-04-12 DIAGNOSIS — I509 Heart failure, unspecified: Secondary | ICD-10-CM | POA: Diagnosis not present

## 2012-04-13 DIAGNOSIS — N179 Acute kidney failure, unspecified: Secondary | ICD-10-CM | POA: Diagnosis not present

## 2012-04-13 DIAGNOSIS — I509 Heart failure, unspecified: Secondary | ICD-10-CM | POA: Diagnosis not present

## 2012-04-13 DIAGNOSIS — N183 Chronic kidney disease, stage 3 unspecified: Secondary | ICD-10-CM | POA: Diagnosis not present

## 2012-04-14 DIAGNOSIS — I509 Heart failure, unspecified: Secondary | ICD-10-CM | POA: Diagnosis not present

## 2012-04-14 DIAGNOSIS — I279 Pulmonary heart disease, unspecified: Secondary | ICD-10-CM | POA: Diagnosis not present

## 2012-04-14 DIAGNOSIS — N179 Acute kidney failure, unspecified: Secondary | ICD-10-CM | POA: Diagnosis not present

## 2012-04-14 DIAGNOSIS — I5033 Acute on chronic diastolic (congestive) heart failure: Secondary | ICD-10-CM | POA: Diagnosis not present

## 2012-04-14 DIAGNOSIS — N183 Chronic kidney disease, stage 3 unspecified: Secondary | ICD-10-CM | POA: Diagnosis not present

## 2012-04-14 DIAGNOSIS — J9610 Chronic respiratory failure, unspecified whether with hypoxia or hypercapnia: Secondary | ICD-10-CM | POA: Diagnosis not present

## 2012-04-15 DIAGNOSIS — N179 Acute kidney failure, unspecified: Secondary | ICD-10-CM | POA: Diagnosis not present

## 2012-04-15 DIAGNOSIS — N186 End stage renal disease: Secondary | ICD-10-CM | POA: Diagnosis not present

## 2012-04-15 DIAGNOSIS — M25529 Pain in unspecified elbow: Secondary | ICD-10-CM | POA: Diagnosis not present

## 2012-04-15 DIAGNOSIS — J9610 Chronic respiratory failure, unspecified whether with hypoxia or hypercapnia: Secondary | ICD-10-CM | POA: Diagnosis not present

## 2012-04-15 DIAGNOSIS — N183 Chronic kidney disease, stage 3 unspecified: Secondary | ICD-10-CM | POA: Diagnosis not present

## 2012-04-15 DIAGNOSIS — I517 Cardiomegaly: Secondary | ICD-10-CM | POA: Diagnosis not present

## 2012-04-15 DIAGNOSIS — I5033 Acute on chronic diastolic (congestive) heart failure: Secondary | ICD-10-CM | POA: Diagnosis not present

## 2012-04-15 DIAGNOSIS — I279 Pulmonary heart disease, unspecified: Secondary | ICD-10-CM | POA: Diagnosis not present

## 2012-04-15 DIAGNOSIS — Z4901 Encounter for fitting and adjustment of extracorporeal dialysis catheter: Secondary | ICD-10-CM | POA: Diagnosis not present

## 2012-04-15 DIAGNOSIS — G8918 Other acute postprocedural pain: Secondary | ICD-10-CM | POA: Diagnosis not present

## 2012-04-15 DIAGNOSIS — R0989 Other specified symptoms and signs involving the circulatory and respiratory systems: Secondary | ICD-10-CM | POA: Diagnosis not present

## 2012-04-15 DIAGNOSIS — I509 Heart failure, unspecified: Secondary | ICD-10-CM | POA: Diagnosis not present

## 2012-04-16 DIAGNOSIS — N179 Acute kidney failure, unspecified: Secondary | ICD-10-CM | POA: Diagnosis not present

## 2012-04-16 DIAGNOSIS — I5033 Acute on chronic diastolic (congestive) heart failure: Secondary | ICD-10-CM | POA: Diagnosis not present

## 2012-04-16 DIAGNOSIS — J9610 Chronic respiratory failure, unspecified whether with hypoxia or hypercapnia: Secondary | ICD-10-CM | POA: Diagnosis not present

## 2012-04-16 DIAGNOSIS — I509 Heart failure, unspecified: Secondary | ICD-10-CM | POA: Diagnosis not present

## 2012-04-16 DIAGNOSIS — N183 Chronic kidney disease, stage 3 unspecified: Secondary | ICD-10-CM | POA: Diagnosis not present

## 2012-04-16 DIAGNOSIS — I279 Pulmonary heart disease, unspecified: Secondary | ICD-10-CM | POA: Diagnosis not present

## 2012-04-17 DIAGNOSIS — I509 Heart failure, unspecified: Secondary | ICD-10-CM | POA: Diagnosis not present

## 2012-04-17 DIAGNOSIS — I5033 Acute on chronic diastolic (congestive) heart failure: Secondary | ICD-10-CM | POA: Diagnosis not present

## 2012-04-17 DIAGNOSIS — I279 Pulmonary heart disease, unspecified: Secondary | ICD-10-CM | POA: Diagnosis not present

## 2012-04-17 DIAGNOSIS — N183 Chronic kidney disease, stage 3 unspecified: Secondary | ICD-10-CM | POA: Diagnosis not present

## 2012-04-17 DIAGNOSIS — J9610 Chronic respiratory failure, unspecified whether with hypoxia or hypercapnia: Secondary | ICD-10-CM | POA: Diagnosis not present

## 2012-04-17 DIAGNOSIS — N179 Acute kidney failure, unspecified: Secondary | ICD-10-CM | POA: Diagnosis not present

## 2012-04-18 DIAGNOSIS — N183 Chronic kidney disease, stage 3 unspecified: Secondary | ICD-10-CM | POA: Diagnosis not present

## 2012-04-18 DIAGNOSIS — N179 Acute kidney failure, unspecified: Secondary | ICD-10-CM | POA: Diagnosis not present

## 2012-04-18 DIAGNOSIS — J9610 Chronic respiratory failure, unspecified whether with hypoxia or hypercapnia: Secondary | ICD-10-CM | POA: Diagnosis not present

## 2012-04-18 DIAGNOSIS — I509 Heart failure, unspecified: Secondary | ICD-10-CM | POA: Diagnosis not present

## 2012-04-18 DIAGNOSIS — I279 Pulmonary heart disease, unspecified: Secondary | ICD-10-CM | POA: Diagnosis not present

## 2012-04-18 DIAGNOSIS — I5033 Acute on chronic diastolic (congestive) heart failure: Secondary | ICD-10-CM | POA: Diagnosis not present

## 2012-04-19 DIAGNOSIS — I5033 Acute on chronic diastolic (congestive) heart failure: Secondary | ICD-10-CM | POA: Diagnosis not present

## 2012-04-19 DIAGNOSIS — N179 Acute kidney failure, unspecified: Secondary | ICD-10-CM | POA: Diagnosis not present

## 2012-04-19 DIAGNOSIS — N183 Chronic kidney disease, stage 3 unspecified: Secondary | ICD-10-CM | POA: Diagnosis not present

## 2012-04-19 DIAGNOSIS — I509 Heart failure, unspecified: Secondary | ICD-10-CM | POA: Diagnosis not present

## 2012-04-20 DIAGNOSIS — N183 Chronic kidney disease, stage 3 unspecified: Secondary | ICD-10-CM | POA: Diagnosis not present

## 2012-04-20 DIAGNOSIS — I509 Heart failure, unspecified: Secondary | ICD-10-CM | POA: Diagnosis not present

## 2012-04-20 DIAGNOSIS — N179 Acute kidney failure, unspecified: Secondary | ICD-10-CM | POA: Diagnosis not present

## 2012-04-21 DIAGNOSIS — I279 Pulmonary heart disease, unspecified: Secondary | ICD-10-CM | POA: Diagnosis not present

## 2012-04-21 DIAGNOSIS — N184 Chronic kidney disease, stage 4 (severe): Secondary | ICD-10-CM | POA: Diagnosis not present

## 2012-04-21 DIAGNOSIS — I509 Heart failure, unspecified: Secondary | ICD-10-CM | POA: Diagnosis not present

## 2012-04-21 DIAGNOSIS — N179 Acute kidney failure, unspecified: Secondary | ICD-10-CM | POA: Diagnosis not present

## 2012-04-21 DIAGNOSIS — I5033 Acute on chronic diastolic (congestive) heart failure: Secondary | ICD-10-CM | POA: Diagnosis not present

## 2012-04-21 DIAGNOSIS — N183 Chronic kidney disease, stage 3 unspecified: Secondary | ICD-10-CM | POA: Diagnosis not present

## 2012-04-21 DIAGNOSIS — J9610 Chronic respiratory failure, unspecified whether with hypoxia or hypercapnia: Secondary | ICD-10-CM | POA: Diagnosis not present

## 2012-04-22 DIAGNOSIS — N183 Chronic kidney disease, stage 3 unspecified: Secondary | ICD-10-CM | POA: Diagnosis not present

## 2012-04-22 DIAGNOSIS — I279 Pulmonary heart disease, unspecified: Secondary | ICD-10-CM | POA: Diagnosis not present

## 2012-04-22 DIAGNOSIS — J9610 Chronic respiratory failure, unspecified whether with hypoxia or hypercapnia: Secondary | ICD-10-CM | POA: Diagnosis not present

## 2012-04-22 DIAGNOSIS — N184 Chronic kidney disease, stage 4 (severe): Secondary | ICD-10-CM | POA: Diagnosis not present

## 2012-04-22 DIAGNOSIS — N179 Acute kidney failure, unspecified: Secondary | ICD-10-CM | POA: Diagnosis not present

## 2012-04-22 DIAGNOSIS — I2789 Other specified pulmonary heart diseases: Secondary | ICD-10-CM | POA: Diagnosis not present

## 2012-04-22 DIAGNOSIS — I5033 Acute on chronic diastolic (congestive) heart failure: Secondary | ICD-10-CM | POA: Diagnosis not present

## 2012-04-22 DIAGNOSIS — I509 Heart failure, unspecified: Secondary | ICD-10-CM | POA: Diagnosis not present

## 2012-04-23 DIAGNOSIS — N179 Acute kidney failure, unspecified: Secondary | ICD-10-CM | POA: Diagnosis not present

## 2012-04-23 DIAGNOSIS — I279 Pulmonary heart disease, unspecified: Secondary | ICD-10-CM | POA: Diagnosis not present

## 2012-04-23 DIAGNOSIS — I509 Heart failure, unspecified: Secondary | ICD-10-CM | POA: Diagnosis not present

## 2012-04-23 DIAGNOSIS — I5033 Acute on chronic diastolic (congestive) heart failure: Secondary | ICD-10-CM | POA: Diagnosis not present

## 2012-04-23 DIAGNOSIS — N183 Chronic kidney disease, stage 3 unspecified: Secondary | ICD-10-CM | POA: Diagnosis not present

## 2012-04-23 DIAGNOSIS — J9610 Chronic respiratory failure, unspecified whether with hypoxia or hypercapnia: Secondary | ICD-10-CM | POA: Diagnosis not present

## 2012-04-25 DIAGNOSIS — I509 Heart failure, unspecified: Secondary | ICD-10-CM | POA: Diagnosis not present

## 2012-04-25 DIAGNOSIS — E1129 Type 2 diabetes mellitus with other diabetic kidney complication: Secondary | ICD-10-CM | POA: Diagnosis not present

## 2012-04-25 DIAGNOSIS — I120 Hypertensive chronic kidney disease with stage 5 chronic kidney disease or end stage renal disease: Secondary | ICD-10-CM | POA: Diagnosis not present

## 2012-04-25 DIAGNOSIS — T82898A Other specified complication of vascular prosthetic devices, implants and grafts, initial encounter: Secondary | ICD-10-CM | POA: Diagnosis not present

## 2012-04-25 DIAGNOSIS — N186 End stage renal disease: Secondary | ICD-10-CM | POA: Diagnosis not present

## 2012-04-25 DIAGNOSIS — Z992 Dependence on renal dialysis: Secondary | ICD-10-CM | POA: Diagnosis not present

## 2012-04-25 DIAGNOSIS — E785 Hyperlipidemia, unspecified: Secondary | ICD-10-CM | POA: Diagnosis not present

## 2012-04-25 DIAGNOSIS — D509 Iron deficiency anemia, unspecified: Secondary | ICD-10-CM | POA: Diagnosis not present

## 2012-04-25 DIAGNOSIS — I4891 Unspecified atrial fibrillation: Secondary | ICD-10-CM | POA: Diagnosis not present

## 2012-04-25 DIAGNOSIS — M1990 Unspecified osteoarthritis, unspecified site: Secondary | ICD-10-CM | POA: Diagnosis not present

## 2012-04-25 DIAGNOSIS — E119 Type 2 diabetes mellitus without complications: Secondary | ICD-10-CM | POA: Diagnosis not present

## 2012-04-25 DIAGNOSIS — N2581 Secondary hyperparathyroidism of renal origin: Secondary | ICD-10-CM | POA: Diagnosis not present

## 2012-04-28 DIAGNOSIS — T82898A Other specified complication of vascular prosthetic devices, implants and grafts, initial encounter: Secondary | ICD-10-CM | POA: Diagnosis not present

## 2012-04-28 DIAGNOSIS — N2581 Secondary hyperparathyroidism of renal origin: Secondary | ICD-10-CM | POA: Diagnosis not present

## 2012-04-28 DIAGNOSIS — Z992 Dependence on renal dialysis: Secondary | ICD-10-CM | POA: Diagnosis not present

## 2012-04-28 DIAGNOSIS — N186 End stage renal disease: Secondary | ICD-10-CM | POA: Diagnosis not present

## 2012-04-28 DIAGNOSIS — D509 Iron deficiency anemia, unspecified: Secondary | ICD-10-CM | POA: Diagnosis not present

## 2012-04-29 DIAGNOSIS — M1990 Unspecified osteoarthritis, unspecified site: Secondary | ICD-10-CM | POA: Diagnosis not present

## 2012-04-29 DIAGNOSIS — I509 Heart failure, unspecified: Secondary | ICD-10-CM | POA: Diagnosis not present

## 2012-04-29 DIAGNOSIS — N186 End stage renal disease: Secondary | ICD-10-CM | POA: Diagnosis not present

## 2012-04-29 DIAGNOSIS — E119 Type 2 diabetes mellitus without complications: Secondary | ICD-10-CM | POA: Diagnosis not present

## 2012-04-29 DIAGNOSIS — I4891 Unspecified atrial fibrillation: Secondary | ICD-10-CM | POA: Diagnosis not present

## 2012-04-29 DIAGNOSIS — I120 Hypertensive chronic kidney disease with stage 5 chronic kidney disease or end stage renal disease: Secondary | ICD-10-CM | POA: Diagnosis not present

## 2012-04-30 DIAGNOSIS — R3129 Other microscopic hematuria: Secondary | ICD-10-CM | POA: Diagnosis not present

## 2012-04-30 DIAGNOSIS — N185 Chronic kidney disease, stage 5: Secondary | ICD-10-CM | POA: Diagnosis not present

## 2012-04-30 DIAGNOSIS — I4891 Unspecified atrial fibrillation: Secondary | ICD-10-CM | POA: Diagnosis not present

## 2012-04-30 DIAGNOSIS — I509 Heart failure, unspecified: Secondary | ICD-10-CM | POA: Diagnosis not present

## 2012-04-30 DIAGNOSIS — I130 Hypertensive heart and chronic kidney disease with heart failure and stage 1 through stage 4 chronic kidney disease, or unspecified chronic kidney disease: Secondary | ICD-10-CM | POA: Diagnosis not present

## 2012-04-30 DIAGNOSIS — N189 Chronic kidney disease, unspecified: Secondary | ICD-10-CM | POA: Diagnosis not present

## 2012-04-30 DIAGNOSIS — G4733 Obstructive sleep apnea (adult) (pediatric): Secondary | ICD-10-CM | POA: Diagnosis not present

## 2012-04-30 DIAGNOSIS — M1990 Unspecified osteoarthritis, unspecified site: Secondary | ICD-10-CM | POA: Diagnosis not present

## 2012-04-30 DIAGNOSIS — I120 Hypertensive chronic kidney disease with stage 5 chronic kidney disease or end stage renal disease: Secondary | ICD-10-CM | POA: Diagnosis not present

## 2012-04-30 DIAGNOSIS — N186 End stage renal disease: Secondary | ICD-10-CM | POA: Diagnosis not present

## 2012-04-30 DIAGNOSIS — E119 Type 2 diabetes mellitus without complications: Secondary | ICD-10-CM | POA: Diagnosis not present

## 2012-05-02 DIAGNOSIS — N2581 Secondary hyperparathyroidism of renal origin: Secondary | ICD-10-CM | POA: Diagnosis not present

## 2012-05-02 DIAGNOSIS — T82898A Other specified complication of vascular prosthetic devices, implants and grafts, initial encounter: Secondary | ICD-10-CM | POA: Diagnosis not present

## 2012-05-02 DIAGNOSIS — N186 End stage renal disease: Secondary | ICD-10-CM | POA: Diagnosis not present

## 2012-05-02 DIAGNOSIS — Z992 Dependence on renal dialysis: Secondary | ICD-10-CM | POA: Diagnosis not present

## 2012-05-02 DIAGNOSIS — D509 Iron deficiency anemia, unspecified: Secondary | ICD-10-CM | POA: Diagnosis not present

## 2012-05-05 DIAGNOSIS — Z992 Dependence on renal dialysis: Secondary | ICD-10-CM | POA: Diagnosis not present

## 2012-05-05 DIAGNOSIS — N186 End stage renal disease: Secondary | ICD-10-CM | POA: Diagnosis not present

## 2012-05-05 DIAGNOSIS — N2581 Secondary hyperparathyroidism of renal origin: Secondary | ICD-10-CM | POA: Diagnosis not present

## 2012-05-05 DIAGNOSIS — T82898A Other specified complication of vascular prosthetic devices, implants and grafts, initial encounter: Secondary | ICD-10-CM | POA: Diagnosis not present

## 2012-05-05 DIAGNOSIS — D509 Iron deficiency anemia, unspecified: Secondary | ICD-10-CM | POA: Diagnosis not present

## 2012-05-07 DIAGNOSIS — E119 Type 2 diabetes mellitus without complications: Secondary | ICD-10-CM | POA: Diagnosis not present

## 2012-05-07 DIAGNOSIS — I4891 Unspecified atrial fibrillation: Secondary | ICD-10-CM | POA: Diagnosis not present

## 2012-05-07 DIAGNOSIS — I509 Heart failure, unspecified: Secondary | ICD-10-CM | POA: Diagnosis not present

## 2012-05-07 DIAGNOSIS — I120 Hypertensive chronic kidney disease with stage 5 chronic kidney disease or end stage renal disease: Secondary | ICD-10-CM | POA: Diagnosis not present

## 2012-05-07 DIAGNOSIS — M1990 Unspecified osteoarthritis, unspecified site: Secondary | ICD-10-CM | POA: Diagnosis not present

## 2012-05-07 DIAGNOSIS — N186 End stage renal disease: Secondary | ICD-10-CM | POA: Diagnosis not present

## 2012-05-08 DIAGNOSIS — I4891 Unspecified atrial fibrillation: Secondary | ICD-10-CM | POA: Diagnosis not present

## 2012-05-08 DIAGNOSIS — N186 End stage renal disease: Secondary | ICD-10-CM | POA: Diagnosis not present

## 2012-05-08 DIAGNOSIS — I509 Heart failure, unspecified: Secondary | ICD-10-CM | POA: Diagnosis not present

## 2012-05-08 DIAGNOSIS — E119 Type 2 diabetes mellitus without complications: Secondary | ICD-10-CM | POA: Diagnosis not present

## 2012-05-08 DIAGNOSIS — M1990 Unspecified osteoarthritis, unspecified site: Secondary | ICD-10-CM | POA: Diagnosis not present

## 2012-05-08 DIAGNOSIS — I120 Hypertensive chronic kidney disease with stage 5 chronic kidney disease or end stage renal disease: Secondary | ICD-10-CM | POA: Diagnosis not present

## 2012-05-09 DIAGNOSIS — M1990 Unspecified osteoarthritis, unspecified site: Secondary | ICD-10-CM | POA: Diagnosis not present

## 2012-05-09 DIAGNOSIS — I4891 Unspecified atrial fibrillation: Secondary | ICD-10-CM | POA: Diagnosis not present

## 2012-05-09 DIAGNOSIS — N2581 Secondary hyperparathyroidism of renal origin: Secondary | ICD-10-CM | POA: Diagnosis not present

## 2012-05-09 DIAGNOSIS — I120 Hypertensive chronic kidney disease with stage 5 chronic kidney disease or end stage renal disease: Secondary | ICD-10-CM | POA: Diagnosis not present

## 2012-05-09 DIAGNOSIS — N186 End stage renal disease: Secondary | ICD-10-CM | POA: Diagnosis not present

## 2012-05-09 DIAGNOSIS — I509 Heart failure, unspecified: Secondary | ICD-10-CM | POA: Diagnosis not present

## 2012-05-09 DIAGNOSIS — Z992 Dependence on renal dialysis: Secondary | ICD-10-CM | POA: Diagnosis not present

## 2012-05-09 DIAGNOSIS — T82898A Other specified complication of vascular prosthetic devices, implants and grafts, initial encounter: Secondary | ICD-10-CM | POA: Diagnosis not present

## 2012-05-09 DIAGNOSIS — E119 Type 2 diabetes mellitus without complications: Secondary | ICD-10-CM | POA: Diagnosis not present

## 2012-05-09 DIAGNOSIS — D509 Iron deficiency anemia, unspecified: Secondary | ICD-10-CM | POA: Diagnosis not present

## 2012-05-11 DIAGNOSIS — N186 End stage renal disease: Secondary | ICD-10-CM | POA: Diagnosis not present

## 2012-05-12 DIAGNOSIS — N186 End stage renal disease: Secondary | ICD-10-CM | POA: Diagnosis not present

## 2012-05-12 DIAGNOSIS — I120 Hypertensive chronic kidney disease with stage 5 chronic kidney disease or end stage renal disease: Secondary | ICD-10-CM | POA: Diagnosis not present

## 2012-05-12 DIAGNOSIS — M1990 Unspecified osteoarthritis, unspecified site: Secondary | ICD-10-CM | POA: Diagnosis not present

## 2012-05-12 DIAGNOSIS — D509 Iron deficiency anemia, unspecified: Secondary | ICD-10-CM | POA: Diagnosis not present

## 2012-05-12 DIAGNOSIS — Z992 Dependence on renal dialysis: Secondary | ICD-10-CM | POA: Diagnosis not present

## 2012-05-12 DIAGNOSIS — I4891 Unspecified atrial fibrillation: Secondary | ICD-10-CM | POA: Diagnosis not present

## 2012-05-12 DIAGNOSIS — E119 Type 2 diabetes mellitus without complications: Secondary | ICD-10-CM | POA: Diagnosis not present

## 2012-05-12 DIAGNOSIS — N2581 Secondary hyperparathyroidism of renal origin: Secondary | ICD-10-CM | POA: Diagnosis not present

## 2012-05-12 DIAGNOSIS — T82898A Other specified complication of vascular prosthetic devices, implants and grafts, initial encounter: Secondary | ICD-10-CM | POA: Diagnosis not present

## 2012-05-12 DIAGNOSIS — I509 Heart failure, unspecified: Secondary | ICD-10-CM | POA: Diagnosis not present

## 2012-05-13 DIAGNOSIS — I4891 Unspecified atrial fibrillation: Secondary | ICD-10-CM | POA: Diagnosis not present

## 2012-05-13 DIAGNOSIS — N186 End stage renal disease: Secondary | ICD-10-CM | POA: Diagnosis not present

## 2012-05-13 DIAGNOSIS — M1990 Unspecified osteoarthritis, unspecified site: Secondary | ICD-10-CM | POA: Diagnosis not present

## 2012-05-13 DIAGNOSIS — I120 Hypertensive chronic kidney disease with stage 5 chronic kidney disease or end stage renal disease: Secondary | ICD-10-CM | POA: Diagnosis not present

## 2012-05-13 DIAGNOSIS — I509 Heart failure, unspecified: Secondary | ICD-10-CM | POA: Diagnosis not present

## 2012-05-13 DIAGNOSIS — E119 Type 2 diabetes mellitus without complications: Secondary | ICD-10-CM | POA: Diagnosis not present

## 2012-05-14 DIAGNOSIS — N186 End stage renal disease: Secondary | ICD-10-CM | POA: Diagnosis not present

## 2012-05-14 DIAGNOSIS — I509 Heart failure, unspecified: Secondary | ICD-10-CM | POA: Diagnosis not present

## 2012-05-14 DIAGNOSIS — Z23 Encounter for immunization: Secondary | ICD-10-CM | POA: Diagnosis not present

## 2012-05-14 DIAGNOSIS — M12269 Villonodular synovitis (pigmented), unspecified knee: Secondary | ICD-10-CM | POA: Diagnosis not present

## 2012-05-14 DIAGNOSIS — M1710 Unilateral primary osteoarthritis, unspecified knee: Secondary | ICD-10-CM | POA: Diagnosis not present

## 2012-05-14 DIAGNOSIS — N2581 Secondary hyperparathyroidism of renal origin: Secondary | ICD-10-CM | POA: Diagnosis not present

## 2012-05-14 DIAGNOSIS — E669 Obesity, unspecified: Secondary | ICD-10-CM | POA: Diagnosis not present

## 2012-05-14 DIAGNOSIS — G4733 Obstructive sleep apnea (adult) (pediatric): Secondary | ICD-10-CM | POA: Diagnosis not present

## 2012-05-14 DIAGNOSIS — J9610 Chronic respiratory failure, unspecified whether with hypoxia or hypercapnia: Secondary | ICD-10-CM | POA: Diagnosis not present

## 2012-05-14 DIAGNOSIS — Z992 Dependence on renal dialysis: Secondary | ICD-10-CM | POA: Diagnosis not present

## 2012-05-14 DIAGNOSIS — D509 Iron deficiency anemia, unspecified: Secondary | ICD-10-CM | POA: Diagnosis not present

## 2012-05-15 DIAGNOSIS — I4891 Unspecified atrial fibrillation: Secondary | ICD-10-CM | POA: Diagnosis not present

## 2012-05-15 DIAGNOSIS — E119 Type 2 diabetes mellitus without complications: Secondary | ICD-10-CM | POA: Diagnosis not present

## 2012-05-15 DIAGNOSIS — Z5181 Encounter for therapeutic drug level monitoring: Secondary | ICD-10-CM | POA: Diagnosis not present

## 2012-05-15 DIAGNOSIS — M1990 Unspecified osteoarthritis, unspecified site: Secondary | ICD-10-CM | POA: Diagnosis not present

## 2012-05-15 DIAGNOSIS — I120 Hypertensive chronic kidney disease with stage 5 chronic kidney disease or end stage renal disease: Secondary | ICD-10-CM | POA: Diagnosis not present

## 2012-05-15 DIAGNOSIS — N186 End stage renal disease: Secondary | ICD-10-CM | POA: Diagnosis not present

## 2012-05-15 DIAGNOSIS — I509 Heart failure, unspecified: Secondary | ICD-10-CM | POA: Diagnosis not present

## 2012-05-15 DIAGNOSIS — Z7901 Long term (current) use of anticoagulants: Secondary | ICD-10-CM | POA: Diagnosis not present

## 2012-05-16 DIAGNOSIS — Z23 Encounter for immunization: Secondary | ICD-10-CM | POA: Diagnosis not present

## 2012-05-16 DIAGNOSIS — N2581 Secondary hyperparathyroidism of renal origin: Secondary | ICD-10-CM | POA: Diagnosis not present

## 2012-05-16 DIAGNOSIS — N186 End stage renal disease: Secondary | ICD-10-CM | POA: Diagnosis not present

## 2012-05-16 DIAGNOSIS — D509 Iron deficiency anemia, unspecified: Secondary | ICD-10-CM | POA: Diagnosis not present

## 2012-05-16 DIAGNOSIS — Z992 Dependence on renal dialysis: Secondary | ICD-10-CM | POA: Diagnosis not present

## 2012-05-19 DIAGNOSIS — N2581 Secondary hyperparathyroidism of renal origin: Secondary | ICD-10-CM | POA: Diagnosis not present

## 2012-05-19 DIAGNOSIS — Z23 Encounter for immunization: Secondary | ICD-10-CM | POA: Diagnosis not present

## 2012-05-19 DIAGNOSIS — Z5181 Encounter for therapeutic drug level monitoring: Secondary | ICD-10-CM | POA: Diagnosis not present

## 2012-05-19 DIAGNOSIS — Z7901 Long term (current) use of anticoagulants: Secondary | ICD-10-CM | POA: Diagnosis not present

## 2012-05-19 DIAGNOSIS — I4891 Unspecified atrial fibrillation: Secondary | ICD-10-CM | POA: Diagnosis not present

## 2012-05-19 DIAGNOSIS — D509 Iron deficiency anemia, unspecified: Secondary | ICD-10-CM | POA: Diagnosis not present

## 2012-05-19 DIAGNOSIS — Z992 Dependence on renal dialysis: Secondary | ICD-10-CM | POA: Diagnosis not present

## 2012-05-19 DIAGNOSIS — N186 End stage renal disease: Secondary | ICD-10-CM | POA: Diagnosis not present

## 2012-05-20 DIAGNOSIS — N186 End stage renal disease: Secondary | ICD-10-CM | POA: Diagnosis not present

## 2012-05-20 DIAGNOSIS — E119 Type 2 diabetes mellitus without complications: Secondary | ICD-10-CM | POA: Diagnosis not present

## 2012-05-20 DIAGNOSIS — I120 Hypertensive chronic kidney disease with stage 5 chronic kidney disease or end stage renal disease: Secondary | ICD-10-CM | POA: Diagnosis not present

## 2012-05-20 DIAGNOSIS — I4891 Unspecified atrial fibrillation: Secondary | ICD-10-CM | POA: Diagnosis not present

## 2012-05-20 DIAGNOSIS — I509 Heart failure, unspecified: Secondary | ICD-10-CM | POA: Diagnosis not present

## 2012-05-20 DIAGNOSIS — M1990 Unspecified osteoarthritis, unspecified site: Secondary | ICD-10-CM | POA: Diagnosis not present

## 2012-05-21 DIAGNOSIS — I509 Heart failure, unspecified: Secondary | ICD-10-CM | POA: Diagnosis not present

## 2012-05-21 DIAGNOSIS — Z992 Dependence on renal dialysis: Secondary | ICD-10-CM | POA: Diagnosis not present

## 2012-05-21 DIAGNOSIS — E119 Type 2 diabetes mellitus without complications: Secondary | ICD-10-CM | POA: Diagnosis not present

## 2012-05-21 DIAGNOSIS — I4891 Unspecified atrial fibrillation: Secondary | ICD-10-CM | POA: Diagnosis not present

## 2012-05-21 DIAGNOSIS — I120 Hypertensive chronic kidney disease with stage 5 chronic kidney disease or end stage renal disease: Secondary | ICD-10-CM | POA: Diagnosis not present

## 2012-05-21 DIAGNOSIS — N2581 Secondary hyperparathyroidism of renal origin: Secondary | ICD-10-CM | POA: Diagnosis not present

## 2012-05-21 DIAGNOSIS — D509 Iron deficiency anemia, unspecified: Secondary | ICD-10-CM | POA: Diagnosis not present

## 2012-05-21 DIAGNOSIS — N186 End stage renal disease: Secondary | ICD-10-CM | POA: Diagnosis not present

## 2012-05-21 DIAGNOSIS — M1990 Unspecified osteoarthritis, unspecified site: Secondary | ICD-10-CM | POA: Diagnosis not present

## 2012-05-21 DIAGNOSIS — Z23 Encounter for immunization: Secondary | ICD-10-CM | POA: Diagnosis not present

## 2012-05-22 DIAGNOSIS — I509 Heart failure, unspecified: Secondary | ICD-10-CM | POA: Diagnosis not present

## 2012-05-22 DIAGNOSIS — I10 Essential (primary) hypertension: Secondary | ICD-10-CM | POA: Diagnosis not present

## 2012-05-22 DIAGNOSIS — IMO0001 Reserved for inherently not codable concepts without codable children: Secondary | ICD-10-CM | POA: Diagnosis not present

## 2012-05-23 DIAGNOSIS — I120 Hypertensive chronic kidney disease with stage 5 chronic kidney disease or end stage renal disease: Secondary | ICD-10-CM | POA: Diagnosis not present

## 2012-05-23 DIAGNOSIS — I509 Heart failure, unspecified: Secondary | ICD-10-CM | POA: Diagnosis not present

## 2012-05-23 DIAGNOSIS — D509 Iron deficiency anemia, unspecified: Secondary | ICD-10-CM | POA: Diagnosis not present

## 2012-05-23 DIAGNOSIS — I4891 Unspecified atrial fibrillation: Secondary | ICD-10-CM | POA: Diagnosis not present

## 2012-05-23 DIAGNOSIS — M1990 Unspecified osteoarthritis, unspecified site: Secondary | ICD-10-CM | POA: Diagnosis not present

## 2012-05-23 DIAGNOSIS — N186 End stage renal disease: Secondary | ICD-10-CM | POA: Diagnosis not present

## 2012-05-23 DIAGNOSIS — Z23 Encounter for immunization: Secondary | ICD-10-CM | POA: Diagnosis not present

## 2012-05-23 DIAGNOSIS — N2581 Secondary hyperparathyroidism of renal origin: Secondary | ICD-10-CM | POA: Diagnosis not present

## 2012-05-23 DIAGNOSIS — E119 Type 2 diabetes mellitus without complications: Secondary | ICD-10-CM | POA: Diagnosis not present

## 2012-05-23 DIAGNOSIS — Z992 Dependence on renal dialysis: Secondary | ICD-10-CM | POA: Diagnosis not present

## 2012-05-26 DIAGNOSIS — N186 End stage renal disease: Secondary | ICD-10-CM | POA: Diagnosis not present

## 2012-05-26 DIAGNOSIS — Z992 Dependence on renal dialysis: Secondary | ICD-10-CM | POA: Diagnosis not present

## 2012-05-26 DIAGNOSIS — D509 Iron deficiency anemia, unspecified: Secondary | ICD-10-CM | POA: Diagnosis not present

## 2012-05-26 DIAGNOSIS — Z23 Encounter for immunization: Secondary | ICD-10-CM | POA: Diagnosis not present

## 2012-05-26 DIAGNOSIS — N2581 Secondary hyperparathyroidism of renal origin: Secondary | ICD-10-CM | POA: Diagnosis not present

## 2012-05-28 DIAGNOSIS — Z992 Dependence on renal dialysis: Secondary | ICD-10-CM | POA: Diagnosis not present

## 2012-05-28 DIAGNOSIS — N186 End stage renal disease: Secondary | ICD-10-CM | POA: Diagnosis not present

## 2012-05-28 DIAGNOSIS — D509 Iron deficiency anemia, unspecified: Secondary | ICD-10-CM | POA: Diagnosis not present

## 2012-05-28 DIAGNOSIS — N2581 Secondary hyperparathyroidism of renal origin: Secondary | ICD-10-CM | POA: Diagnosis not present

## 2012-05-28 DIAGNOSIS — Z23 Encounter for immunization: Secondary | ICD-10-CM | POA: Diagnosis not present

## 2012-05-29 DIAGNOSIS — Z7901 Long term (current) use of anticoagulants: Secondary | ICD-10-CM | POA: Diagnosis not present

## 2012-05-29 DIAGNOSIS — I4891 Unspecified atrial fibrillation: Secondary | ICD-10-CM | POA: Diagnosis not present

## 2012-05-30 DIAGNOSIS — I509 Heart failure, unspecified: Secondary | ICD-10-CM | POA: Diagnosis not present

## 2012-05-30 DIAGNOSIS — I4891 Unspecified atrial fibrillation: Secondary | ICD-10-CM | POA: Diagnosis not present

## 2012-05-30 DIAGNOSIS — N2581 Secondary hyperparathyroidism of renal origin: Secondary | ICD-10-CM | POA: Diagnosis not present

## 2012-05-30 DIAGNOSIS — I120 Hypertensive chronic kidney disease with stage 5 chronic kidney disease or end stage renal disease: Secondary | ICD-10-CM | POA: Diagnosis not present

## 2012-05-30 DIAGNOSIS — E119 Type 2 diabetes mellitus without complications: Secondary | ICD-10-CM | POA: Diagnosis not present

## 2012-05-30 DIAGNOSIS — N186 End stage renal disease: Secondary | ICD-10-CM | POA: Diagnosis not present

## 2012-05-30 DIAGNOSIS — Z992 Dependence on renal dialysis: Secondary | ICD-10-CM | POA: Diagnosis not present

## 2012-05-30 DIAGNOSIS — D509 Iron deficiency anemia, unspecified: Secondary | ICD-10-CM | POA: Diagnosis not present

## 2012-05-30 DIAGNOSIS — M1990 Unspecified osteoarthritis, unspecified site: Secondary | ICD-10-CM | POA: Diagnosis not present

## 2012-05-30 DIAGNOSIS — Z23 Encounter for immunization: Secondary | ICD-10-CM | POA: Diagnosis not present

## 2012-06-02 DIAGNOSIS — Z992 Dependence on renal dialysis: Secondary | ICD-10-CM | POA: Diagnosis not present

## 2012-06-02 DIAGNOSIS — D509 Iron deficiency anemia, unspecified: Secondary | ICD-10-CM | POA: Diagnosis not present

## 2012-06-02 DIAGNOSIS — N186 End stage renal disease: Secondary | ICD-10-CM | POA: Diagnosis not present

## 2012-06-02 DIAGNOSIS — Z23 Encounter for immunization: Secondary | ICD-10-CM | POA: Diagnosis not present

## 2012-06-02 DIAGNOSIS — N2581 Secondary hyperparathyroidism of renal origin: Secondary | ICD-10-CM | POA: Diagnosis not present

## 2012-06-03 DIAGNOSIS — I5032 Chronic diastolic (congestive) heart failure: Secondary | ICD-10-CM | POA: Diagnosis not present

## 2012-06-04 DIAGNOSIS — I120 Hypertensive chronic kidney disease with stage 5 chronic kidney disease or end stage renal disease: Secondary | ICD-10-CM | POA: Diagnosis not present

## 2012-06-04 DIAGNOSIS — Z992 Dependence on renal dialysis: Secondary | ICD-10-CM | POA: Diagnosis not present

## 2012-06-04 DIAGNOSIS — M1990 Unspecified osteoarthritis, unspecified site: Secondary | ICD-10-CM | POA: Diagnosis not present

## 2012-06-04 DIAGNOSIS — I4891 Unspecified atrial fibrillation: Secondary | ICD-10-CM | POA: Diagnosis not present

## 2012-06-04 DIAGNOSIS — D509 Iron deficiency anemia, unspecified: Secondary | ICD-10-CM | POA: Diagnosis not present

## 2012-06-04 DIAGNOSIS — I509 Heart failure, unspecified: Secondary | ICD-10-CM | POA: Diagnosis not present

## 2012-06-04 DIAGNOSIS — E119 Type 2 diabetes mellitus without complications: Secondary | ICD-10-CM | POA: Diagnosis not present

## 2012-06-04 DIAGNOSIS — N186 End stage renal disease: Secondary | ICD-10-CM | POA: Diagnosis not present

## 2012-06-04 DIAGNOSIS — N2581 Secondary hyperparathyroidism of renal origin: Secondary | ICD-10-CM | POA: Diagnosis not present

## 2012-06-04 DIAGNOSIS — Z23 Encounter for immunization: Secondary | ICD-10-CM | POA: Diagnosis not present

## 2012-06-05 DIAGNOSIS — N186 End stage renal disease: Secondary | ICD-10-CM | POA: Diagnosis not present

## 2012-06-05 DIAGNOSIS — M1990 Unspecified osteoarthritis, unspecified site: Secondary | ICD-10-CM | POA: Diagnosis not present

## 2012-06-05 DIAGNOSIS — I4891 Unspecified atrial fibrillation: Secondary | ICD-10-CM | POA: Diagnosis not present

## 2012-06-05 DIAGNOSIS — Z7901 Long term (current) use of anticoagulants: Secondary | ICD-10-CM | POA: Diagnosis not present

## 2012-06-05 DIAGNOSIS — I120 Hypertensive chronic kidney disease with stage 5 chronic kidney disease or end stage renal disease: Secondary | ICD-10-CM | POA: Diagnosis not present

## 2012-06-05 DIAGNOSIS — I509 Heart failure, unspecified: Secondary | ICD-10-CM | POA: Diagnosis not present

## 2012-06-05 DIAGNOSIS — E119 Type 2 diabetes mellitus without complications: Secondary | ICD-10-CM | POA: Diagnosis not present

## 2012-06-06 DIAGNOSIS — Z23 Encounter for immunization: Secondary | ICD-10-CM | POA: Diagnosis not present

## 2012-06-06 DIAGNOSIS — N186 End stage renal disease: Secondary | ICD-10-CM | POA: Diagnosis not present

## 2012-06-06 DIAGNOSIS — D509 Iron deficiency anemia, unspecified: Secondary | ICD-10-CM | POA: Diagnosis not present

## 2012-06-06 DIAGNOSIS — N2581 Secondary hyperparathyroidism of renal origin: Secondary | ICD-10-CM | POA: Diagnosis not present

## 2012-06-06 DIAGNOSIS — Z992 Dependence on renal dialysis: Secondary | ICD-10-CM | POA: Diagnosis not present

## 2012-06-09 DIAGNOSIS — Z23 Encounter for immunization: Secondary | ICD-10-CM | POA: Diagnosis not present

## 2012-06-09 DIAGNOSIS — D509 Iron deficiency anemia, unspecified: Secondary | ICD-10-CM | POA: Diagnosis not present

## 2012-06-09 DIAGNOSIS — N2581 Secondary hyperparathyroidism of renal origin: Secondary | ICD-10-CM | POA: Diagnosis not present

## 2012-06-09 DIAGNOSIS — N186 End stage renal disease: Secondary | ICD-10-CM | POA: Diagnosis not present

## 2012-06-09 DIAGNOSIS — Z992 Dependence on renal dialysis: Secondary | ICD-10-CM | POA: Diagnosis not present

## 2012-06-10 DIAGNOSIS — E119 Type 2 diabetes mellitus without complications: Secondary | ICD-10-CM | POA: Diagnosis not present

## 2012-06-10 DIAGNOSIS — I4891 Unspecified atrial fibrillation: Secondary | ICD-10-CM | POA: Diagnosis not present

## 2012-06-10 DIAGNOSIS — I509 Heart failure, unspecified: Secondary | ICD-10-CM | POA: Diagnosis not present

## 2012-06-10 DIAGNOSIS — I120 Hypertensive chronic kidney disease with stage 5 chronic kidney disease or end stage renal disease: Secondary | ICD-10-CM | POA: Diagnosis not present

## 2012-06-10 DIAGNOSIS — M1990 Unspecified osteoarthritis, unspecified site: Secondary | ICD-10-CM | POA: Diagnosis not present

## 2012-06-10 DIAGNOSIS — N186 End stage renal disease: Secondary | ICD-10-CM | POA: Diagnosis not present

## 2012-06-11 DIAGNOSIS — N2581 Secondary hyperparathyroidism of renal origin: Secondary | ICD-10-CM | POA: Diagnosis not present

## 2012-06-11 DIAGNOSIS — Z23 Encounter for immunization: Secondary | ICD-10-CM | POA: Diagnosis not present

## 2012-06-11 DIAGNOSIS — Z992 Dependence on renal dialysis: Secondary | ICD-10-CM | POA: Diagnosis not present

## 2012-06-11 DIAGNOSIS — N186 End stage renal disease: Secondary | ICD-10-CM | POA: Diagnosis not present

## 2012-06-11 DIAGNOSIS — D509 Iron deficiency anemia, unspecified: Secondary | ICD-10-CM | POA: Diagnosis not present

## 2012-06-13 DIAGNOSIS — N186 End stage renal disease: Secondary | ICD-10-CM | POA: Diagnosis not present

## 2012-06-13 DIAGNOSIS — Z992 Dependence on renal dialysis: Secondary | ICD-10-CM | POA: Diagnosis not present

## 2012-06-13 DIAGNOSIS — D509 Iron deficiency anemia, unspecified: Secondary | ICD-10-CM | POA: Diagnosis not present

## 2012-06-13 DIAGNOSIS — N2581 Secondary hyperparathyroidism of renal origin: Secondary | ICD-10-CM | POA: Diagnosis not present

## 2012-06-13 DIAGNOSIS — Z23 Encounter for immunization: Secondary | ICD-10-CM | POA: Diagnosis not present

## 2012-06-19 DIAGNOSIS — I120 Hypertensive chronic kidney disease with stage 5 chronic kidney disease or end stage renal disease: Secondary | ICD-10-CM | POA: Diagnosis not present

## 2012-06-19 DIAGNOSIS — I509 Heart failure, unspecified: Secondary | ICD-10-CM | POA: Diagnosis not present

## 2012-06-19 DIAGNOSIS — N186 End stage renal disease: Secondary | ICD-10-CM | POA: Diagnosis not present

## 2012-06-19 DIAGNOSIS — M1990 Unspecified osteoarthritis, unspecified site: Secondary | ICD-10-CM | POA: Diagnosis not present

## 2012-06-19 DIAGNOSIS — E119 Type 2 diabetes mellitus without complications: Secondary | ICD-10-CM | POA: Diagnosis not present

## 2012-06-19 DIAGNOSIS — I4891 Unspecified atrial fibrillation: Secondary | ICD-10-CM | POA: Diagnosis not present

## 2012-06-24 DIAGNOSIS — N186 End stage renal disease: Secondary | ICD-10-CM | POA: Diagnosis not present

## 2012-06-24 DIAGNOSIS — I509 Heart failure, unspecified: Secondary | ICD-10-CM | POA: Diagnosis not present

## 2012-06-24 DIAGNOSIS — I120 Hypertensive chronic kidney disease with stage 5 chronic kidney disease or end stage renal disease: Secondary | ICD-10-CM | POA: Diagnosis not present

## 2012-06-24 DIAGNOSIS — K859 Acute pancreatitis without necrosis or infection, unspecified: Secondary | ICD-10-CM | POA: Diagnosis not present

## 2012-06-24 DIAGNOSIS — E119 Type 2 diabetes mellitus without complications: Secondary | ICD-10-CM | POA: Diagnosis not present

## 2012-06-25 DIAGNOSIS — Z23 Encounter for immunization: Secondary | ICD-10-CM | POA: Diagnosis not present

## 2012-06-25 DIAGNOSIS — N2581 Secondary hyperparathyroidism of renal origin: Secondary | ICD-10-CM | POA: Diagnosis not present

## 2012-06-25 DIAGNOSIS — Z992 Dependence on renal dialysis: Secondary | ICD-10-CM | POA: Diagnosis not present

## 2012-06-25 DIAGNOSIS — N186 End stage renal disease: Secondary | ICD-10-CM | POA: Diagnosis not present

## 2012-06-25 DIAGNOSIS — D509 Iron deficiency anemia, unspecified: Secondary | ICD-10-CM | POA: Diagnosis not present

## 2012-06-26 DIAGNOSIS — I4891 Unspecified atrial fibrillation: Secondary | ICD-10-CM | POA: Diagnosis not present

## 2012-06-26 DIAGNOSIS — Z7901 Long term (current) use of anticoagulants: Secondary | ICD-10-CM | POA: Diagnosis not present

## 2012-06-27 DIAGNOSIS — N2581 Secondary hyperparathyroidism of renal origin: Secondary | ICD-10-CM | POA: Diagnosis not present

## 2012-06-27 DIAGNOSIS — Z992 Dependence on renal dialysis: Secondary | ICD-10-CM | POA: Diagnosis not present

## 2012-06-27 DIAGNOSIS — Z23 Encounter for immunization: Secondary | ICD-10-CM | POA: Diagnosis not present

## 2012-06-27 DIAGNOSIS — N186 End stage renal disease: Secondary | ICD-10-CM | POA: Diagnosis not present

## 2012-06-27 DIAGNOSIS — D509 Iron deficiency anemia, unspecified: Secondary | ICD-10-CM | POA: Diagnosis not present

## 2012-06-30 DIAGNOSIS — N2581 Secondary hyperparathyroidism of renal origin: Secondary | ICD-10-CM | POA: Diagnosis not present

## 2012-06-30 DIAGNOSIS — D509 Iron deficiency anemia, unspecified: Secondary | ICD-10-CM | POA: Diagnosis not present

## 2012-06-30 DIAGNOSIS — N186 End stage renal disease: Secondary | ICD-10-CM | POA: Diagnosis not present

## 2012-06-30 DIAGNOSIS — Z992 Dependence on renal dialysis: Secondary | ICD-10-CM | POA: Diagnosis not present

## 2012-06-30 DIAGNOSIS — Z23 Encounter for immunization: Secondary | ICD-10-CM | POA: Diagnosis not present

## 2012-07-02 DIAGNOSIS — Z23 Encounter for immunization: Secondary | ICD-10-CM | POA: Diagnosis not present

## 2012-07-02 DIAGNOSIS — Z992 Dependence on renal dialysis: Secondary | ICD-10-CM | POA: Diagnosis not present

## 2012-07-02 DIAGNOSIS — N2581 Secondary hyperparathyroidism of renal origin: Secondary | ICD-10-CM | POA: Diagnosis not present

## 2012-07-02 DIAGNOSIS — D509 Iron deficiency anemia, unspecified: Secondary | ICD-10-CM | POA: Diagnosis not present

## 2012-07-02 DIAGNOSIS — N186 End stage renal disease: Secondary | ICD-10-CM | POA: Diagnosis not present

## 2012-07-03 DIAGNOSIS — M7730 Calcaneal spur, unspecified foot: Secondary | ICD-10-CM | POA: Diagnosis not present

## 2012-07-03 DIAGNOSIS — L988 Other specified disorders of the skin and subcutaneous tissue: Secondary | ICD-10-CM | POA: Diagnosis not present

## 2012-07-03 DIAGNOSIS — M722 Plantar fascial fibromatosis: Secondary | ICD-10-CM | POA: Diagnosis not present

## 2012-07-03 DIAGNOSIS — E1149 Type 2 diabetes mellitus with other diabetic neurological complication: Secondary | ICD-10-CM | POA: Diagnosis not present

## 2012-07-03 DIAGNOSIS — E1142 Type 2 diabetes mellitus with diabetic polyneuropathy: Secondary | ICD-10-CM | POA: Diagnosis not present

## 2012-07-04 DIAGNOSIS — D509 Iron deficiency anemia, unspecified: Secondary | ICD-10-CM | POA: Diagnosis not present

## 2012-07-04 DIAGNOSIS — N2581 Secondary hyperparathyroidism of renal origin: Secondary | ICD-10-CM | POA: Diagnosis not present

## 2012-07-04 DIAGNOSIS — N186 End stage renal disease: Secondary | ICD-10-CM | POA: Diagnosis not present

## 2012-07-04 DIAGNOSIS — Z23 Encounter for immunization: Secondary | ICD-10-CM | POA: Diagnosis not present

## 2012-07-04 DIAGNOSIS — Z992 Dependence on renal dialysis: Secondary | ICD-10-CM | POA: Diagnosis not present

## 2012-07-07 DIAGNOSIS — Z23 Encounter for immunization: Secondary | ICD-10-CM | POA: Diagnosis not present

## 2012-07-07 DIAGNOSIS — Z992 Dependence on renal dialysis: Secondary | ICD-10-CM | POA: Diagnosis not present

## 2012-07-07 DIAGNOSIS — N186 End stage renal disease: Secondary | ICD-10-CM | POA: Diagnosis not present

## 2012-07-07 DIAGNOSIS — D509 Iron deficiency anemia, unspecified: Secondary | ICD-10-CM | POA: Diagnosis not present

## 2012-07-07 DIAGNOSIS — N2581 Secondary hyperparathyroidism of renal origin: Secondary | ICD-10-CM | POA: Diagnosis not present

## 2012-07-09 DIAGNOSIS — N2581 Secondary hyperparathyroidism of renal origin: Secondary | ICD-10-CM | POA: Diagnosis not present

## 2012-07-09 DIAGNOSIS — Z992 Dependence on renal dialysis: Secondary | ICD-10-CM | POA: Diagnosis not present

## 2012-07-09 DIAGNOSIS — D509 Iron deficiency anemia, unspecified: Secondary | ICD-10-CM | POA: Diagnosis not present

## 2012-07-09 DIAGNOSIS — N186 End stage renal disease: Secondary | ICD-10-CM | POA: Diagnosis not present

## 2012-07-09 DIAGNOSIS — Z23 Encounter for immunization: Secondary | ICD-10-CM | POA: Diagnosis not present

## 2012-07-10 DIAGNOSIS — N186 End stage renal disease: Secondary | ICD-10-CM | POA: Diagnosis not present

## 2012-07-10 DIAGNOSIS — I4891 Unspecified atrial fibrillation: Secondary | ICD-10-CM | POA: Diagnosis not present

## 2012-07-10 DIAGNOSIS — Z7901 Long term (current) use of anticoagulants: Secondary | ICD-10-CM | POA: Diagnosis not present

## 2012-07-10 DIAGNOSIS — T82898A Other specified complication of vascular prosthetic devices, implants and grafts, initial encounter: Secondary | ICD-10-CM | POA: Diagnosis not present

## 2012-07-10 DIAGNOSIS — M722 Plantar fascial fibromatosis: Secondary | ICD-10-CM | POA: Diagnosis not present

## 2012-07-11 DIAGNOSIS — N2581 Secondary hyperparathyroidism of renal origin: Secondary | ICD-10-CM | POA: Diagnosis not present

## 2012-07-11 DIAGNOSIS — N186 End stage renal disease: Secondary | ICD-10-CM | POA: Diagnosis not present

## 2012-07-11 DIAGNOSIS — D509 Iron deficiency anemia, unspecified: Secondary | ICD-10-CM | POA: Diagnosis not present

## 2012-07-11 DIAGNOSIS — Z23 Encounter for immunization: Secondary | ICD-10-CM | POA: Diagnosis not present

## 2012-07-11 DIAGNOSIS — Z992 Dependence on renal dialysis: Secondary | ICD-10-CM | POA: Diagnosis not present

## 2012-07-14 DIAGNOSIS — Z23 Encounter for immunization: Secondary | ICD-10-CM | POA: Diagnosis not present

## 2012-07-14 DIAGNOSIS — N039 Chronic nephritic syndrome with unspecified morphologic changes: Secondary | ICD-10-CM | POA: Diagnosis not present

## 2012-07-14 DIAGNOSIS — N186 End stage renal disease: Secondary | ICD-10-CM | POA: Diagnosis not present

## 2012-07-14 DIAGNOSIS — N2581 Secondary hyperparathyroidism of renal origin: Secondary | ICD-10-CM | POA: Diagnosis not present

## 2012-07-14 DIAGNOSIS — D509 Iron deficiency anemia, unspecified: Secondary | ICD-10-CM | POA: Diagnosis not present

## 2012-07-14 DIAGNOSIS — Z992 Dependence on renal dialysis: Secondary | ICD-10-CM | POA: Diagnosis not present

## 2012-07-14 DIAGNOSIS — D631 Anemia in chronic kidney disease: Secondary | ICD-10-CM | POA: Diagnosis not present

## 2012-07-15 DIAGNOSIS — N186 End stage renal disease: Secondary | ICD-10-CM | POA: Diagnosis not present

## 2012-07-15 DIAGNOSIS — Z452 Encounter for adjustment and management of vascular access device: Secondary | ICD-10-CM | POA: Diagnosis not present

## 2012-07-16 DIAGNOSIS — N186 End stage renal disease: Secondary | ICD-10-CM | POA: Diagnosis not present

## 2012-07-16 DIAGNOSIS — Z23 Encounter for immunization: Secondary | ICD-10-CM | POA: Diagnosis not present

## 2012-07-16 DIAGNOSIS — N039 Chronic nephritic syndrome with unspecified morphologic changes: Secondary | ICD-10-CM | POA: Diagnosis not present

## 2012-07-16 DIAGNOSIS — D631 Anemia in chronic kidney disease: Secondary | ICD-10-CM | POA: Diagnosis not present

## 2012-07-16 DIAGNOSIS — N2581 Secondary hyperparathyroidism of renal origin: Secondary | ICD-10-CM | POA: Diagnosis not present

## 2012-07-16 DIAGNOSIS — D509 Iron deficiency anemia, unspecified: Secondary | ICD-10-CM | POA: Diagnosis not present

## 2012-07-17 DIAGNOSIS — M722 Plantar fascial fibromatosis: Secondary | ICD-10-CM | POA: Diagnosis not present

## 2012-07-18 DIAGNOSIS — N039 Chronic nephritic syndrome with unspecified morphologic changes: Secondary | ICD-10-CM | POA: Diagnosis not present

## 2012-07-18 DIAGNOSIS — Z23 Encounter for immunization: Secondary | ICD-10-CM | POA: Diagnosis not present

## 2012-07-18 DIAGNOSIS — N2581 Secondary hyperparathyroidism of renal origin: Secondary | ICD-10-CM | POA: Diagnosis not present

## 2012-07-18 DIAGNOSIS — D509 Iron deficiency anemia, unspecified: Secondary | ICD-10-CM | POA: Diagnosis not present

## 2012-07-18 DIAGNOSIS — N186 End stage renal disease: Secondary | ICD-10-CM | POA: Diagnosis not present

## 2012-07-18 DIAGNOSIS — D631 Anemia in chronic kidney disease: Secondary | ICD-10-CM | POA: Diagnosis not present

## 2012-07-21 DIAGNOSIS — N2581 Secondary hyperparathyroidism of renal origin: Secondary | ICD-10-CM | POA: Diagnosis not present

## 2012-07-21 DIAGNOSIS — D631 Anemia in chronic kidney disease: Secondary | ICD-10-CM | POA: Diagnosis not present

## 2012-07-21 DIAGNOSIS — N039 Chronic nephritic syndrome with unspecified morphologic changes: Secondary | ICD-10-CM | POA: Diagnosis not present

## 2012-07-21 DIAGNOSIS — D509 Iron deficiency anemia, unspecified: Secondary | ICD-10-CM | POA: Diagnosis not present

## 2012-07-21 DIAGNOSIS — N186 End stage renal disease: Secondary | ICD-10-CM | POA: Diagnosis not present

## 2012-07-21 DIAGNOSIS — Z23 Encounter for immunization: Secondary | ICD-10-CM | POA: Diagnosis not present

## 2012-07-23 DIAGNOSIS — D509 Iron deficiency anemia, unspecified: Secondary | ICD-10-CM | POA: Diagnosis not present

## 2012-07-23 DIAGNOSIS — D631 Anemia in chronic kidney disease: Secondary | ICD-10-CM | POA: Diagnosis not present

## 2012-07-23 DIAGNOSIS — E785 Hyperlipidemia, unspecified: Secondary | ICD-10-CM | POA: Diagnosis not present

## 2012-07-23 DIAGNOSIS — N186 End stage renal disease: Secondary | ICD-10-CM | POA: Diagnosis not present

## 2012-07-23 DIAGNOSIS — E1129 Type 2 diabetes mellitus with other diabetic kidney complication: Secondary | ICD-10-CM | POA: Diagnosis not present

## 2012-07-23 DIAGNOSIS — Z23 Encounter for immunization: Secondary | ICD-10-CM | POA: Diagnosis not present

## 2012-07-23 DIAGNOSIS — N2581 Secondary hyperparathyroidism of renal origin: Secondary | ICD-10-CM | POA: Diagnosis not present

## 2012-07-24 DIAGNOSIS — Z7901 Long term (current) use of anticoagulants: Secondary | ICD-10-CM | POA: Diagnosis not present

## 2012-07-24 DIAGNOSIS — I4891 Unspecified atrial fibrillation: Secondary | ICD-10-CM | POA: Diagnosis not present

## 2012-07-25 DIAGNOSIS — N186 End stage renal disease: Secondary | ICD-10-CM | POA: Diagnosis not present

## 2012-07-25 DIAGNOSIS — D509 Iron deficiency anemia, unspecified: Secondary | ICD-10-CM | POA: Diagnosis not present

## 2012-07-25 DIAGNOSIS — D631 Anemia in chronic kidney disease: Secondary | ICD-10-CM | POA: Diagnosis not present

## 2012-07-25 DIAGNOSIS — N2581 Secondary hyperparathyroidism of renal origin: Secondary | ICD-10-CM | POA: Diagnosis not present

## 2012-07-25 DIAGNOSIS — Z23 Encounter for immunization: Secondary | ICD-10-CM | POA: Diagnosis not present

## 2012-07-28 DIAGNOSIS — Z23 Encounter for immunization: Secondary | ICD-10-CM | POA: Diagnosis not present

## 2012-07-28 DIAGNOSIS — N186 End stage renal disease: Secondary | ICD-10-CM | POA: Diagnosis not present

## 2012-07-28 DIAGNOSIS — D509 Iron deficiency anemia, unspecified: Secondary | ICD-10-CM | POA: Diagnosis not present

## 2012-07-28 DIAGNOSIS — D631 Anemia in chronic kidney disease: Secondary | ICD-10-CM | POA: Diagnosis not present

## 2012-07-28 DIAGNOSIS — N2581 Secondary hyperparathyroidism of renal origin: Secondary | ICD-10-CM | POA: Diagnosis not present

## 2012-07-28 DIAGNOSIS — N039 Chronic nephritic syndrome with unspecified morphologic changes: Secondary | ICD-10-CM | POA: Diagnosis not present

## 2012-07-29 DIAGNOSIS — L819 Disorder of pigmentation, unspecified: Secondary | ICD-10-CM | POA: Diagnosis not present

## 2012-07-29 DIAGNOSIS — L738 Other specified follicular disorders: Secondary | ICD-10-CM | POA: Diagnosis not present

## 2012-07-29 DIAGNOSIS — L708 Other acne: Secondary | ICD-10-CM | POA: Diagnosis not present

## 2012-07-31 DIAGNOSIS — D509 Iron deficiency anemia, unspecified: Secondary | ICD-10-CM | POA: Diagnosis not present

## 2012-07-31 DIAGNOSIS — D631 Anemia in chronic kidney disease: Secondary | ICD-10-CM | POA: Diagnosis not present

## 2012-07-31 DIAGNOSIS — N2581 Secondary hyperparathyroidism of renal origin: Secondary | ICD-10-CM | POA: Diagnosis not present

## 2012-07-31 DIAGNOSIS — Z23 Encounter for immunization: Secondary | ICD-10-CM | POA: Diagnosis not present

## 2012-07-31 DIAGNOSIS — N186 End stage renal disease: Secondary | ICD-10-CM | POA: Diagnosis not present

## 2012-08-01 DIAGNOSIS — Z23 Encounter for immunization: Secondary | ICD-10-CM | POA: Diagnosis not present

## 2012-08-01 DIAGNOSIS — D631 Anemia in chronic kidney disease: Secondary | ICD-10-CM | POA: Diagnosis not present

## 2012-08-01 DIAGNOSIS — D509 Iron deficiency anemia, unspecified: Secondary | ICD-10-CM | POA: Diagnosis not present

## 2012-08-01 DIAGNOSIS — N186 End stage renal disease: Secondary | ICD-10-CM | POA: Diagnosis not present

## 2012-08-01 DIAGNOSIS — N2581 Secondary hyperparathyroidism of renal origin: Secondary | ICD-10-CM | POA: Diagnosis not present

## 2012-08-04 DIAGNOSIS — N186 End stage renal disease: Secondary | ICD-10-CM | POA: Diagnosis not present

## 2012-08-04 DIAGNOSIS — D509 Iron deficiency anemia, unspecified: Secondary | ICD-10-CM | POA: Diagnosis not present

## 2012-08-04 DIAGNOSIS — Z23 Encounter for immunization: Secondary | ICD-10-CM | POA: Diagnosis not present

## 2012-08-04 DIAGNOSIS — N2581 Secondary hyperparathyroidism of renal origin: Secondary | ICD-10-CM | POA: Diagnosis not present

## 2012-08-04 DIAGNOSIS — D631 Anemia in chronic kidney disease: Secondary | ICD-10-CM | POA: Diagnosis not present

## 2012-08-06 DIAGNOSIS — D631 Anemia in chronic kidney disease: Secondary | ICD-10-CM | POA: Diagnosis not present

## 2012-08-06 DIAGNOSIS — N2581 Secondary hyperparathyroidism of renal origin: Secondary | ICD-10-CM | POA: Diagnosis not present

## 2012-08-06 DIAGNOSIS — N039 Chronic nephritic syndrome with unspecified morphologic changes: Secondary | ICD-10-CM | POA: Diagnosis not present

## 2012-08-06 DIAGNOSIS — Z23 Encounter for immunization: Secondary | ICD-10-CM | POA: Diagnosis not present

## 2012-08-06 DIAGNOSIS — N186 End stage renal disease: Secondary | ICD-10-CM | POA: Diagnosis not present

## 2012-08-06 DIAGNOSIS — D509 Iron deficiency anemia, unspecified: Secondary | ICD-10-CM | POA: Diagnosis not present

## 2012-08-08 DIAGNOSIS — N2581 Secondary hyperparathyroidism of renal origin: Secondary | ICD-10-CM | POA: Diagnosis not present

## 2012-08-08 DIAGNOSIS — Z23 Encounter for immunization: Secondary | ICD-10-CM | POA: Diagnosis not present

## 2012-08-08 DIAGNOSIS — D631 Anemia in chronic kidney disease: Secondary | ICD-10-CM | POA: Diagnosis not present

## 2012-08-08 DIAGNOSIS — N186 End stage renal disease: Secondary | ICD-10-CM | POA: Diagnosis not present

## 2012-08-08 DIAGNOSIS — D509 Iron deficiency anemia, unspecified: Secondary | ICD-10-CM | POA: Diagnosis not present

## 2012-08-11 DIAGNOSIS — I4891 Unspecified atrial fibrillation: Secondary | ICD-10-CM | POA: Diagnosis not present

## 2012-08-11 DIAGNOSIS — Z5181 Encounter for therapeutic drug level monitoring: Secondary | ICD-10-CM | POA: Diagnosis not present

## 2012-08-11 DIAGNOSIS — Z7901 Long term (current) use of anticoagulants: Secondary | ICD-10-CM | POA: Diagnosis not present

## 2012-08-11 DIAGNOSIS — Z23 Encounter for immunization: Secondary | ICD-10-CM | POA: Diagnosis not present

## 2012-08-11 DIAGNOSIS — D631 Anemia in chronic kidney disease: Secondary | ICD-10-CM | POA: Diagnosis not present

## 2012-08-11 DIAGNOSIS — D509 Iron deficiency anemia, unspecified: Secondary | ICD-10-CM | POA: Diagnosis not present

## 2012-08-11 DIAGNOSIS — N2581 Secondary hyperparathyroidism of renal origin: Secondary | ICD-10-CM | POA: Diagnosis not present

## 2012-08-11 DIAGNOSIS — N186 End stage renal disease: Secondary | ICD-10-CM | POA: Diagnosis not present

## 2012-08-13 DIAGNOSIS — N2581 Secondary hyperparathyroidism of renal origin: Secondary | ICD-10-CM | POA: Diagnosis not present

## 2012-08-13 DIAGNOSIS — N186 End stage renal disease: Secondary | ICD-10-CM | POA: Diagnosis not present

## 2012-08-13 DIAGNOSIS — Z992 Dependence on renal dialysis: Secondary | ICD-10-CM | POA: Diagnosis not present

## 2012-08-13 DIAGNOSIS — M25669 Stiffness of unspecified knee, not elsewhere classified: Secondary | ICD-10-CM | POA: Diagnosis not present

## 2012-08-13 DIAGNOSIS — D509 Iron deficiency anemia, unspecified: Secondary | ICD-10-CM | POA: Diagnosis not present

## 2012-08-13 DIAGNOSIS — M1710 Unilateral primary osteoarthritis, unspecified knee: Secondary | ICD-10-CM | POA: Diagnosis not present

## 2012-08-13 DIAGNOSIS — D631 Anemia in chronic kidney disease: Secondary | ICD-10-CM | POA: Diagnosis not present

## 2012-08-15 DIAGNOSIS — N186 End stage renal disease: Secondary | ICD-10-CM | POA: Diagnosis not present

## 2012-08-15 DIAGNOSIS — N2581 Secondary hyperparathyroidism of renal origin: Secondary | ICD-10-CM | POA: Diagnosis not present

## 2012-08-15 DIAGNOSIS — D509 Iron deficiency anemia, unspecified: Secondary | ICD-10-CM | POA: Diagnosis not present

## 2012-08-15 DIAGNOSIS — Z992 Dependence on renal dialysis: Secondary | ICD-10-CM | POA: Diagnosis not present

## 2012-08-15 DIAGNOSIS — D631 Anemia in chronic kidney disease: Secondary | ICD-10-CM | POA: Diagnosis not present

## 2012-08-18 DIAGNOSIS — N186 End stage renal disease: Secondary | ICD-10-CM | POA: Diagnosis not present

## 2012-08-18 DIAGNOSIS — I871 Compression of vein: Secondary | ICD-10-CM | POA: Diagnosis not present

## 2012-08-18 DIAGNOSIS — T82898A Other specified complication of vascular prosthetic devices, implants and grafts, initial encounter: Secondary | ICD-10-CM | POA: Diagnosis not present

## 2012-08-19 DIAGNOSIS — D509 Iron deficiency anemia, unspecified: Secondary | ICD-10-CM | POA: Diagnosis not present

## 2012-08-19 DIAGNOSIS — I4891 Unspecified atrial fibrillation: Secondary | ICD-10-CM | POA: Diagnosis not present

## 2012-08-19 DIAGNOSIS — I509 Heart failure, unspecified: Secondary | ICD-10-CM | POA: Diagnosis not present

## 2012-08-19 DIAGNOSIS — N186 End stage renal disease: Secondary | ICD-10-CM | POA: Diagnosis not present

## 2012-08-19 DIAGNOSIS — N2581 Secondary hyperparathyroidism of renal origin: Secondary | ICD-10-CM | POA: Diagnosis not present

## 2012-08-19 DIAGNOSIS — Z992 Dependence on renal dialysis: Secondary | ICD-10-CM | POA: Diagnosis not present

## 2012-08-19 DIAGNOSIS — D631 Anemia in chronic kidney disease: Secondary | ICD-10-CM | POA: Diagnosis not present

## 2012-08-20 DIAGNOSIS — N186 End stage renal disease: Secondary | ICD-10-CM | POA: Diagnosis not present

## 2012-08-20 DIAGNOSIS — G4733 Obstructive sleep apnea (adult) (pediatric): Secondary | ICD-10-CM | POA: Diagnosis not present

## 2012-08-20 DIAGNOSIS — D631 Anemia in chronic kidney disease: Secondary | ICD-10-CM | POA: Diagnosis not present

## 2012-08-20 DIAGNOSIS — N2581 Secondary hyperparathyroidism of renal origin: Secondary | ICD-10-CM | POA: Diagnosis not present

## 2012-08-20 DIAGNOSIS — I509 Heart failure, unspecified: Secondary | ICD-10-CM | POA: Diagnosis not present

## 2012-08-20 DIAGNOSIS — D509 Iron deficiency anemia, unspecified: Secondary | ICD-10-CM | POA: Diagnosis not present

## 2012-08-20 DIAGNOSIS — Z992 Dependence on renal dialysis: Secondary | ICD-10-CM | POA: Diagnosis not present

## 2012-08-20 DIAGNOSIS — E669 Obesity, unspecified: Secondary | ICD-10-CM | POA: Diagnosis not present

## 2012-08-20 DIAGNOSIS — I10 Essential (primary) hypertension: Secondary | ICD-10-CM | POA: Diagnosis not present

## 2012-08-22 DIAGNOSIS — N2581 Secondary hyperparathyroidism of renal origin: Secondary | ICD-10-CM | POA: Diagnosis not present

## 2012-08-22 DIAGNOSIS — D509 Iron deficiency anemia, unspecified: Secondary | ICD-10-CM | POA: Diagnosis not present

## 2012-08-22 DIAGNOSIS — N039 Chronic nephritic syndrome with unspecified morphologic changes: Secondary | ICD-10-CM | POA: Diagnosis not present

## 2012-08-22 DIAGNOSIS — Z992 Dependence on renal dialysis: Secondary | ICD-10-CM | POA: Diagnosis not present

## 2012-08-22 DIAGNOSIS — N186 End stage renal disease: Secondary | ICD-10-CM | POA: Diagnosis not present

## 2012-08-22 DIAGNOSIS — D631 Anemia in chronic kidney disease: Secondary | ICD-10-CM | POA: Diagnosis not present

## 2012-08-25 DIAGNOSIS — Z992 Dependence on renal dialysis: Secondary | ICD-10-CM | POA: Diagnosis not present

## 2012-08-25 DIAGNOSIS — N039 Chronic nephritic syndrome with unspecified morphologic changes: Secondary | ICD-10-CM | POA: Diagnosis not present

## 2012-08-25 DIAGNOSIS — D631 Anemia in chronic kidney disease: Secondary | ICD-10-CM | POA: Diagnosis not present

## 2012-08-25 DIAGNOSIS — N2581 Secondary hyperparathyroidism of renal origin: Secondary | ICD-10-CM | POA: Diagnosis not present

## 2012-08-25 DIAGNOSIS — D509 Iron deficiency anemia, unspecified: Secondary | ICD-10-CM | POA: Diagnosis not present

## 2012-08-25 DIAGNOSIS — N186 End stage renal disease: Secondary | ICD-10-CM | POA: Diagnosis not present

## 2012-08-27 DIAGNOSIS — D631 Anemia in chronic kidney disease: Secondary | ICD-10-CM | POA: Diagnosis not present

## 2012-08-27 DIAGNOSIS — N186 End stage renal disease: Secondary | ICD-10-CM | POA: Diagnosis not present

## 2012-08-27 DIAGNOSIS — N2581 Secondary hyperparathyroidism of renal origin: Secondary | ICD-10-CM | POA: Diagnosis not present

## 2012-08-27 DIAGNOSIS — N039 Chronic nephritic syndrome with unspecified morphologic changes: Secondary | ICD-10-CM | POA: Diagnosis not present

## 2012-08-27 DIAGNOSIS — D509 Iron deficiency anemia, unspecified: Secondary | ICD-10-CM | POA: Diagnosis not present

## 2012-08-29 DIAGNOSIS — N2581 Secondary hyperparathyroidism of renal origin: Secondary | ICD-10-CM | POA: Diagnosis not present

## 2012-08-29 DIAGNOSIS — N039 Chronic nephritic syndrome with unspecified morphologic changes: Secondary | ICD-10-CM | POA: Diagnosis not present

## 2012-08-29 DIAGNOSIS — N186 End stage renal disease: Secondary | ICD-10-CM | POA: Diagnosis not present

## 2012-08-29 DIAGNOSIS — D631 Anemia in chronic kidney disease: Secondary | ICD-10-CM | POA: Diagnosis not present

## 2012-08-29 DIAGNOSIS — D509 Iron deficiency anemia, unspecified: Secondary | ICD-10-CM | POA: Diagnosis not present

## 2012-09-01 DIAGNOSIS — N2581 Secondary hyperparathyroidism of renal origin: Secondary | ICD-10-CM | POA: Diagnosis not present

## 2012-09-01 DIAGNOSIS — D509 Iron deficiency anemia, unspecified: Secondary | ICD-10-CM | POA: Diagnosis not present

## 2012-09-01 DIAGNOSIS — D631 Anemia in chronic kidney disease: Secondary | ICD-10-CM | POA: Diagnosis not present

## 2012-09-01 DIAGNOSIS — N186 End stage renal disease: Secondary | ICD-10-CM | POA: Diagnosis not present

## 2012-09-03 DIAGNOSIS — N2581 Secondary hyperparathyroidism of renal origin: Secondary | ICD-10-CM | POA: Diagnosis not present

## 2012-09-03 DIAGNOSIS — N039 Chronic nephritic syndrome with unspecified morphologic changes: Secondary | ICD-10-CM | POA: Diagnosis not present

## 2012-09-03 DIAGNOSIS — D631 Anemia in chronic kidney disease: Secondary | ICD-10-CM | POA: Diagnosis not present

## 2012-09-03 DIAGNOSIS — N186 End stage renal disease: Secondary | ICD-10-CM | POA: Diagnosis not present

## 2012-09-05 DIAGNOSIS — N039 Chronic nephritic syndrome with unspecified morphologic changes: Secondary | ICD-10-CM | POA: Diagnosis not present

## 2012-09-05 DIAGNOSIS — D631 Anemia in chronic kidney disease: Secondary | ICD-10-CM | POA: Diagnosis not present

## 2012-09-05 DIAGNOSIS — N2581 Secondary hyperparathyroidism of renal origin: Secondary | ICD-10-CM | POA: Diagnosis not present

## 2012-09-05 DIAGNOSIS — N186 End stage renal disease: Secondary | ICD-10-CM | POA: Diagnosis not present

## 2012-09-08 DIAGNOSIS — D631 Anemia in chronic kidney disease: Secondary | ICD-10-CM | POA: Diagnosis not present

## 2012-09-08 DIAGNOSIS — N186 End stage renal disease: Secondary | ICD-10-CM | POA: Diagnosis not present

## 2012-09-08 DIAGNOSIS — D509 Iron deficiency anemia, unspecified: Secondary | ICD-10-CM | POA: Diagnosis not present

## 2012-09-08 DIAGNOSIS — N039 Chronic nephritic syndrome with unspecified morphologic changes: Secondary | ICD-10-CM | POA: Diagnosis not present

## 2012-09-08 DIAGNOSIS — N2581 Secondary hyperparathyroidism of renal origin: Secondary | ICD-10-CM | POA: Diagnosis not present

## 2012-09-09 DIAGNOSIS — H4010X0 Unspecified open-angle glaucoma, stage unspecified: Secondary | ICD-10-CM | POA: Diagnosis not present

## 2012-09-09 DIAGNOSIS — E119 Type 2 diabetes mellitus without complications: Secondary | ICD-10-CM | POA: Diagnosis not present

## 2012-09-09 DIAGNOSIS — H2510 Age-related nuclear cataract, unspecified eye: Secondary | ICD-10-CM | POA: Diagnosis not present

## 2012-09-10 DIAGNOSIS — L708 Other acne: Secondary | ICD-10-CM | POA: Diagnosis not present

## 2012-09-10 DIAGNOSIS — D631 Anemia in chronic kidney disease: Secondary | ICD-10-CM | POA: Diagnosis not present

## 2012-09-10 DIAGNOSIS — D509 Iron deficiency anemia, unspecified: Secondary | ICD-10-CM | POA: Diagnosis not present

## 2012-09-10 DIAGNOSIS — N186 End stage renal disease: Secondary | ICD-10-CM | POA: Diagnosis not present

## 2012-09-10 DIAGNOSIS — N2581 Secondary hyperparathyroidism of renal origin: Secondary | ICD-10-CM | POA: Diagnosis not present

## 2012-09-10 DIAGNOSIS — L738 Other specified follicular disorders: Secondary | ICD-10-CM | POA: Diagnosis not present

## 2012-09-10 DIAGNOSIS — L819 Disorder of pigmentation, unspecified: Secondary | ICD-10-CM | POA: Diagnosis not present

## 2012-09-11 DIAGNOSIS — E1142 Type 2 diabetes mellitus with diabetic polyneuropathy: Secondary | ICD-10-CM | POA: Diagnosis not present

## 2012-09-11 DIAGNOSIS — I4891 Unspecified atrial fibrillation: Secondary | ICD-10-CM | POA: Diagnosis not present

## 2012-09-11 DIAGNOSIS — Z7901 Long term (current) use of anticoagulants: Secondary | ICD-10-CM | POA: Diagnosis not present

## 2012-09-11 DIAGNOSIS — M722 Plantar fascial fibromatosis: Secondary | ICD-10-CM | POA: Diagnosis not present

## 2012-09-11 DIAGNOSIS — E1149 Type 2 diabetes mellitus with other diabetic neurological complication: Secondary | ICD-10-CM | POA: Diagnosis not present

## 2012-09-13 DIAGNOSIS — N2581 Secondary hyperparathyroidism of renal origin: Secondary | ICD-10-CM | POA: Diagnosis not present

## 2012-09-13 DIAGNOSIS — D509 Iron deficiency anemia, unspecified: Secondary | ICD-10-CM | POA: Diagnosis not present

## 2012-09-13 DIAGNOSIS — N186 End stage renal disease: Secondary | ICD-10-CM | POA: Diagnosis not present

## 2012-09-13 DIAGNOSIS — D631 Anemia in chronic kidney disease: Secondary | ICD-10-CM | POA: Diagnosis not present

## 2012-09-13 DIAGNOSIS — N039 Chronic nephritic syndrome with unspecified morphologic changes: Secondary | ICD-10-CM | POA: Diagnosis not present

## 2012-09-13 DIAGNOSIS — Z992 Dependence on renal dialysis: Secondary | ICD-10-CM | POA: Diagnosis not present

## 2012-09-15 DIAGNOSIS — N2581 Secondary hyperparathyroidism of renal origin: Secondary | ICD-10-CM | POA: Diagnosis not present

## 2012-09-15 DIAGNOSIS — N186 End stage renal disease: Secondary | ICD-10-CM | POA: Diagnosis not present

## 2012-09-15 DIAGNOSIS — D631 Anemia in chronic kidney disease: Secondary | ICD-10-CM | POA: Diagnosis not present

## 2012-09-15 DIAGNOSIS — Z992 Dependence on renal dialysis: Secondary | ICD-10-CM | POA: Diagnosis not present

## 2012-09-15 DIAGNOSIS — D509 Iron deficiency anemia, unspecified: Secondary | ICD-10-CM | POA: Diagnosis not present

## 2012-09-16 DIAGNOSIS — I871 Compression of vein: Secondary | ICD-10-CM | POA: Diagnosis not present

## 2012-09-16 DIAGNOSIS — H4010X0 Unspecified open-angle glaucoma, stage unspecified: Secondary | ICD-10-CM | POA: Diagnosis not present

## 2012-09-16 DIAGNOSIS — T82898A Other specified complication of vascular prosthetic devices, implants and grafts, initial encounter: Secondary | ICD-10-CM | POA: Diagnosis not present

## 2012-09-16 DIAGNOSIS — N186 End stage renal disease: Secondary | ICD-10-CM | POA: Diagnosis not present

## 2012-09-17 DIAGNOSIS — N186 End stage renal disease: Secondary | ICD-10-CM | POA: Diagnosis not present

## 2012-09-17 DIAGNOSIS — N2581 Secondary hyperparathyroidism of renal origin: Secondary | ICD-10-CM | POA: Diagnosis not present

## 2012-09-17 DIAGNOSIS — D631 Anemia in chronic kidney disease: Secondary | ICD-10-CM | POA: Diagnosis not present

## 2012-09-17 DIAGNOSIS — D509 Iron deficiency anemia, unspecified: Secondary | ICD-10-CM | POA: Diagnosis not present

## 2012-09-17 DIAGNOSIS — Z992 Dependence on renal dialysis: Secondary | ICD-10-CM | POA: Diagnosis not present

## 2012-09-19 DIAGNOSIS — N2581 Secondary hyperparathyroidism of renal origin: Secondary | ICD-10-CM | POA: Diagnosis not present

## 2012-09-19 DIAGNOSIS — N186 End stage renal disease: Secondary | ICD-10-CM | POA: Diagnosis not present

## 2012-09-19 DIAGNOSIS — D631 Anemia in chronic kidney disease: Secondary | ICD-10-CM | POA: Diagnosis not present

## 2012-09-19 DIAGNOSIS — N039 Chronic nephritic syndrome with unspecified morphologic changes: Secondary | ICD-10-CM | POA: Diagnosis not present

## 2012-09-19 DIAGNOSIS — D509 Iron deficiency anemia, unspecified: Secondary | ICD-10-CM | POA: Diagnosis not present

## 2012-09-19 DIAGNOSIS — Z992 Dependence on renal dialysis: Secondary | ICD-10-CM | POA: Diagnosis not present

## 2012-09-22 DIAGNOSIS — N186 End stage renal disease: Secondary | ICD-10-CM | POA: Diagnosis not present

## 2012-09-22 DIAGNOSIS — D631 Anemia in chronic kidney disease: Secondary | ICD-10-CM | POA: Diagnosis not present

## 2012-09-22 DIAGNOSIS — Z992 Dependence on renal dialysis: Secondary | ICD-10-CM | POA: Diagnosis not present

## 2012-09-22 DIAGNOSIS — N039 Chronic nephritic syndrome with unspecified morphologic changes: Secondary | ICD-10-CM | POA: Diagnosis not present

## 2012-09-22 DIAGNOSIS — D509 Iron deficiency anemia, unspecified: Secondary | ICD-10-CM | POA: Diagnosis not present

## 2012-09-22 DIAGNOSIS — N2581 Secondary hyperparathyroidism of renal origin: Secondary | ICD-10-CM | POA: Diagnosis not present

## 2012-09-24 DIAGNOSIS — Z992 Dependence on renal dialysis: Secondary | ICD-10-CM | POA: Diagnosis not present

## 2012-09-24 DIAGNOSIS — D631 Anemia in chronic kidney disease: Secondary | ICD-10-CM | POA: Diagnosis not present

## 2012-09-24 DIAGNOSIS — N186 End stage renal disease: Secondary | ICD-10-CM | POA: Diagnosis not present

## 2012-09-24 DIAGNOSIS — N2581 Secondary hyperparathyroidism of renal origin: Secondary | ICD-10-CM | POA: Diagnosis not present

## 2012-09-24 DIAGNOSIS — D509 Iron deficiency anemia, unspecified: Secondary | ICD-10-CM | POA: Diagnosis not present

## 2012-09-25 DIAGNOSIS — Z7901 Long term (current) use of anticoagulants: Secondary | ICD-10-CM | POA: Diagnosis not present

## 2012-09-25 DIAGNOSIS — I4891 Unspecified atrial fibrillation: Secondary | ICD-10-CM | POA: Diagnosis not present

## 2012-09-26 DIAGNOSIS — Z992 Dependence on renal dialysis: Secondary | ICD-10-CM | POA: Diagnosis not present

## 2012-09-26 DIAGNOSIS — D509 Iron deficiency anemia, unspecified: Secondary | ICD-10-CM | POA: Diagnosis not present

## 2012-09-26 DIAGNOSIS — N186 End stage renal disease: Secondary | ICD-10-CM | POA: Diagnosis not present

## 2012-09-26 DIAGNOSIS — N039 Chronic nephritic syndrome with unspecified morphologic changes: Secondary | ICD-10-CM | POA: Diagnosis not present

## 2012-09-26 DIAGNOSIS — N2581 Secondary hyperparathyroidism of renal origin: Secondary | ICD-10-CM | POA: Diagnosis not present

## 2012-09-26 DIAGNOSIS — D631 Anemia in chronic kidney disease: Secondary | ICD-10-CM | POA: Diagnosis not present

## 2012-09-29 DIAGNOSIS — D509 Iron deficiency anemia, unspecified: Secondary | ICD-10-CM | POA: Diagnosis not present

## 2012-09-29 DIAGNOSIS — N186 End stage renal disease: Secondary | ICD-10-CM | POA: Diagnosis not present

## 2012-09-29 DIAGNOSIS — N2581 Secondary hyperparathyroidism of renal origin: Secondary | ICD-10-CM | POA: Diagnosis not present

## 2012-09-29 DIAGNOSIS — Z992 Dependence on renal dialysis: Secondary | ICD-10-CM | POA: Diagnosis not present

## 2012-09-29 DIAGNOSIS — D631 Anemia in chronic kidney disease: Secondary | ICD-10-CM | POA: Diagnosis not present

## 2012-10-01 DIAGNOSIS — D509 Iron deficiency anemia, unspecified: Secondary | ICD-10-CM | POA: Diagnosis not present

## 2012-10-01 DIAGNOSIS — N186 End stage renal disease: Secondary | ICD-10-CM | POA: Diagnosis not present

## 2012-10-01 DIAGNOSIS — Z992 Dependence on renal dialysis: Secondary | ICD-10-CM | POA: Diagnosis not present

## 2012-10-01 DIAGNOSIS — D631 Anemia in chronic kidney disease: Secondary | ICD-10-CM | POA: Diagnosis not present

## 2012-10-01 DIAGNOSIS — N2581 Secondary hyperparathyroidism of renal origin: Secondary | ICD-10-CM | POA: Diagnosis not present

## 2012-10-03 DIAGNOSIS — Z992 Dependence on renal dialysis: Secondary | ICD-10-CM | POA: Diagnosis not present

## 2012-10-03 DIAGNOSIS — N2581 Secondary hyperparathyroidism of renal origin: Secondary | ICD-10-CM | POA: Diagnosis not present

## 2012-10-03 DIAGNOSIS — N186 End stage renal disease: Secondary | ICD-10-CM | POA: Diagnosis not present

## 2012-10-03 DIAGNOSIS — D509 Iron deficiency anemia, unspecified: Secondary | ICD-10-CM | POA: Diagnosis not present

## 2012-10-03 DIAGNOSIS — D631 Anemia in chronic kidney disease: Secondary | ICD-10-CM | POA: Diagnosis not present

## 2012-10-06 DIAGNOSIS — D631 Anemia in chronic kidney disease: Secondary | ICD-10-CM | POA: Diagnosis not present

## 2012-10-06 DIAGNOSIS — N186 End stage renal disease: Secondary | ICD-10-CM | POA: Diagnosis not present

## 2012-10-06 DIAGNOSIS — Z992 Dependence on renal dialysis: Secondary | ICD-10-CM | POA: Diagnosis not present

## 2012-10-06 DIAGNOSIS — D509 Iron deficiency anemia, unspecified: Secondary | ICD-10-CM | POA: Diagnosis not present

## 2012-10-06 DIAGNOSIS — N2581 Secondary hyperparathyroidism of renal origin: Secondary | ICD-10-CM | POA: Diagnosis not present

## 2012-10-07 DIAGNOSIS — N529 Male erectile dysfunction, unspecified: Secondary | ICD-10-CM | POA: Diagnosis not present

## 2012-10-08 DIAGNOSIS — Z7901 Long term (current) use of anticoagulants: Secondary | ICD-10-CM | POA: Diagnosis not present

## 2012-10-08 DIAGNOSIS — N186 End stage renal disease: Secondary | ICD-10-CM | POA: Diagnosis not present

## 2012-10-08 DIAGNOSIS — D509 Iron deficiency anemia, unspecified: Secondary | ICD-10-CM | POA: Diagnosis not present

## 2012-10-08 DIAGNOSIS — I4891 Unspecified atrial fibrillation: Secondary | ICD-10-CM | POA: Diagnosis not present

## 2012-10-08 DIAGNOSIS — Z992 Dependence on renal dialysis: Secondary | ICD-10-CM | POA: Diagnosis not present

## 2012-10-08 DIAGNOSIS — D631 Anemia in chronic kidney disease: Secondary | ICD-10-CM | POA: Diagnosis not present

## 2012-10-08 DIAGNOSIS — N2581 Secondary hyperparathyroidism of renal origin: Secondary | ICD-10-CM | POA: Diagnosis not present

## 2012-10-09 DIAGNOSIS — I4891 Unspecified atrial fibrillation: Secondary | ICD-10-CM | POA: Diagnosis not present

## 2012-10-09 DIAGNOSIS — E119 Type 2 diabetes mellitus without complications: Secondary | ICD-10-CM | POA: Diagnosis not present

## 2012-10-09 DIAGNOSIS — I10 Essential (primary) hypertension: Secondary | ICD-10-CM | POA: Diagnosis not present

## 2012-10-09 DIAGNOSIS — I509 Heart failure, unspecified: Secondary | ICD-10-CM | POA: Diagnosis not present

## 2012-10-10 DIAGNOSIS — N2581 Secondary hyperparathyroidism of renal origin: Secondary | ICD-10-CM | POA: Diagnosis not present

## 2012-10-10 DIAGNOSIS — N186 End stage renal disease: Secondary | ICD-10-CM | POA: Diagnosis not present

## 2012-10-10 DIAGNOSIS — D631 Anemia in chronic kidney disease: Secondary | ICD-10-CM | POA: Diagnosis not present

## 2012-10-10 DIAGNOSIS — D509 Iron deficiency anemia, unspecified: Secondary | ICD-10-CM | POA: Diagnosis not present

## 2012-10-10 DIAGNOSIS — Z992 Dependence on renal dialysis: Secondary | ICD-10-CM | POA: Diagnosis not present

## 2012-10-11 DIAGNOSIS — N186 End stage renal disease: Secondary | ICD-10-CM | POA: Diagnosis not present

## 2012-10-13 DIAGNOSIS — N186 End stage renal disease: Secondary | ICD-10-CM | POA: Diagnosis not present

## 2012-10-13 DIAGNOSIS — Z992 Dependence on renal dialysis: Secondary | ICD-10-CM | POA: Diagnosis not present

## 2012-10-13 DIAGNOSIS — N2581 Secondary hyperparathyroidism of renal origin: Secondary | ICD-10-CM | POA: Diagnosis not present

## 2012-10-13 DIAGNOSIS — D509 Iron deficiency anemia, unspecified: Secondary | ICD-10-CM | POA: Diagnosis not present

## 2012-10-13 DIAGNOSIS — Z23 Encounter for immunization: Secondary | ICD-10-CM | POA: Diagnosis not present

## 2012-10-15 DIAGNOSIS — Z992 Dependence on renal dialysis: Secondary | ICD-10-CM | POA: Diagnosis not present

## 2012-10-15 DIAGNOSIS — N186 End stage renal disease: Secondary | ICD-10-CM | POA: Diagnosis not present

## 2012-10-15 DIAGNOSIS — N2581 Secondary hyperparathyroidism of renal origin: Secondary | ICD-10-CM | POA: Diagnosis not present

## 2012-10-15 DIAGNOSIS — Z23 Encounter for immunization: Secondary | ICD-10-CM | POA: Diagnosis not present

## 2012-10-15 DIAGNOSIS — H4010X0 Unspecified open-angle glaucoma, stage unspecified: Secondary | ICD-10-CM | POA: Diagnosis not present

## 2012-10-15 DIAGNOSIS — D509 Iron deficiency anemia, unspecified: Secondary | ICD-10-CM | POA: Diagnosis not present

## 2012-10-18 DIAGNOSIS — Z23 Encounter for immunization: Secondary | ICD-10-CM | POA: Diagnosis not present

## 2012-10-18 DIAGNOSIS — N2581 Secondary hyperparathyroidism of renal origin: Secondary | ICD-10-CM | POA: Diagnosis not present

## 2012-10-18 DIAGNOSIS — N186 End stage renal disease: Secondary | ICD-10-CM | POA: Diagnosis not present

## 2012-10-18 DIAGNOSIS — D509 Iron deficiency anemia, unspecified: Secondary | ICD-10-CM | POA: Diagnosis not present

## 2012-10-18 DIAGNOSIS — Z992 Dependence on renal dialysis: Secondary | ICD-10-CM | POA: Diagnosis not present

## 2012-10-21 DIAGNOSIS — Z992 Dependence on renal dialysis: Secondary | ICD-10-CM | POA: Diagnosis not present

## 2012-10-21 DIAGNOSIS — N186 End stage renal disease: Secondary | ICD-10-CM | POA: Diagnosis not present

## 2012-10-21 DIAGNOSIS — Z23 Encounter for immunization: Secondary | ICD-10-CM | POA: Diagnosis not present

## 2012-10-21 DIAGNOSIS — N2581 Secondary hyperparathyroidism of renal origin: Secondary | ICD-10-CM | POA: Diagnosis not present

## 2012-10-21 DIAGNOSIS — D509 Iron deficiency anemia, unspecified: Secondary | ICD-10-CM | POA: Diagnosis not present

## 2012-10-22 DIAGNOSIS — E1129 Type 2 diabetes mellitus with other diabetic kidney complication: Secondary | ICD-10-CM | POA: Diagnosis not present

## 2012-10-22 DIAGNOSIS — Z7901 Long term (current) use of anticoagulants: Secondary | ICD-10-CM | POA: Diagnosis not present

## 2012-10-22 DIAGNOSIS — Z992 Dependence on renal dialysis: Secondary | ICD-10-CM | POA: Diagnosis not present

## 2012-10-22 DIAGNOSIS — N186 End stage renal disease: Secondary | ICD-10-CM | POA: Diagnosis not present

## 2012-10-22 DIAGNOSIS — Z23 Encounter for immunization: Secondary | ICD-10-CM | POA: Diagnosis not present

## 2012-10-22 DIAGNOSIS — I4891 Unspecified atrial fibrillation: Secondary | ICD-10-CM | POA: Diagnosis not present

## 2012-10-22 DIAGNOSIS — D509 Iron deficiency anemia, unspecified: Secondary | ICD-10-CM | POA: Diagnosis not present

## 2012-10-22 DIAGNOSIS — N2581 Secondary hyperparathyroidism of renal origin: Secondary | ICD-10-CM | POA: Diagnosis not present

## 2012-10-29 DIAGNOSIS — N2581 Secondary hyperparathyroidism of renal origin: Secondary | ICD-10-CM | POA: Diagnosis not present

## 2012-10-29 DIAGNOSIS — D509 Iron deficiency anemia, unspecified: Secondary | ICD-10-CM | POA: Diagnosis not present

## 2012-10-29 DIAGNOSIS — Z23 Encounter for immunization: Secondary | ICD-10-CM | POA: Diagnosis not present

## 2012-10-29 DIAGNOSIS — Z992 Dependence on renal dialysis: Secondary | ICD-10-CM | POA: Diagnosis not present

## 2012-10-29 DIAGNOSIS — N186 End stage renal disease: Secondary | ICD-10-CM | POA: Diagnosis not present

## 2012-11-01 DIAGNOSIS — D509 Iron deficiency anemia, unspecified: Secondary | ICD-10-CM | POA: Diagnosis not present

## 2012-11-01 DIAGNOSIS — Z23 Encounter for immunization: Secondary | ICD-10-CM | POA: Diagnosis not present

## 2012-11-01 DIAGNOSIS — N186 End stage renal disease: Secondary | ICD-10-CM | POA: Diagnosis not present

## 2012-11-01 DIAGNOSIS — Z992 Dependence on renal dialysis: Secondary | ICD-10-CM | POA: Diagnosis not present

## 2012-11-01 DIAGNOSIS — N2581 Secondary hyperparathyroidism of renal origin: Secondary | ICD-10-CM | POA: Diagnosis not present

## 2012-11-03 DIAGNOSIS — N2581 Secondary hyperparathyroidism of renal origin: Secondary | ICD-10-CM | POA: Diagnosis not present

## 2012-11-03 DIAGNOSIS — N186 End stage renal disease: Secondary | ICD-10-CM | POA: Diagnosis not present

## 2012-11-03 DIAGNOSIS — Z992 Dependence on renal dialysis: Secondary | ICD-10-CM | POA: Diagnosis not present

## 2012-11-03 DIAGNOSIS — Z23 Encounter for immunization: Secondary | ICD-10-CM | POA: Diagnosis not present

## 2012-11-03 DIAGNOSIS — D509 Iron deficiency anemia, unspecified: Secondary | ICD-10-CM | POA: Diagnosis not present

## 2012-11-05 DIAGNOSIS — D509 Iron deficiency anemia, unspecified: Secondary | ICD-10-CM | POA: Diagnosis not present

## 2012-11-05 DIAGNOSIS — N186 End stage renal disease: Secondary | ICD-10-CM | POA: Diagnosis not present

## 2012-11-05 DIAGNOSIS — N2581 Secondary hyperparathyroidism of renal origin: Secondary | ICD-10-CM | POA: Diagnosis not present

## 2012-11-05 DIAGNOSIS — Z992 Dependence on renal dialysis: Secondary | ICD-10-CM | POA: Diagnosis not present

## 2012-11-05 DIAGNOSIS — Z23 Encounter for immunization: Secondary | ICD-10-CM | POA: Diagnosis not present

## 2012-11-07 DIAGNOSIS — Z23 Encounter for immunization: Secondary | ICD-10-CM | POA: Diagnosis not present

## 2012-11-07 DIAGNOSIS — N186 End stage renal disease: Secondary | ICD-10-CM | POA: Diagnosis not present

## 2012-11-07 DIAGNOSIS — N2581 Secondary hyperparathyroidism of renal origin: Secondary | ICD-10-CM | POA: Diagnosis not present

## 2012-11-07 DIAGNOSIS — D509 Iron deficiency anemia, unspecified: Secondary | ICD-10-CM | POA: Diagnosis not present

## 2012-11-07 DIAGNOSIS — Z992 Dependence on renal dialysis: Secondary | ICD-10-CM | POA: Diagnosis not present

## 2012-11-10 DIAGNOSIS — Z23 Encounter for immunization: Secondary | ICD-10-CM | POA: Diagnosis not present

## 2012-11-10 DIAGNOSIS — Z992 Dependence on renal dialysis: Secondary | ICD-10-CM | POA: Diagnosis not present

## 2012-11-10 DIAGNOSIS — D509 Iron deficiency anemia, unspecified: Secondary | ICD-10-CM | POA: Diagnosis not present

## 2012-11-10 DIAGNOSIS — N186 End stage renal disease: Secondary | ICD-10-CM | POA: Diagnosis not present

## 2012-11-10 DIAGNOSIS — N2581 Secondary hyperparathyroidism of renal origin: Secondary | ICD-10-CM | POA: Diagnosis not present

## 2012-11-11 DIAGNOSIS — N186 End stage renal disease: Secondary | ICD-10-CM | POA: Diagnosis not present

## 2012-11-12 DIAGNOSIS — N186 End stage renal disease: Secondary | ICD-10-CM | POA: Diagnosis not present

## 2012-11-12 DIAGNOSIS — M1710 Unilateral primary osteoarthritis, unspecified knee: Secondary | ICD-10-CM | POA: Diagnosis not present

## 2012-11-12 DIAGNOSIS — N2581 Secondary hyperparathyroidism of renal origin: Secondary | ICD-10-CM | POA: Diagnosis not present

## 2012-11-12 DIAGNOSIS — M12269 Villonodular synovitis (pigmented), unspecified knee: Secondary | ICD-10-CM | POA: Diagnosis not present

## 2012-11-12 DIAGNOSIS — D631 Anemia in chronic kidney disease: Secondary | ICD-10-CM | POA: Diagnosis not present

## 2012-11-13 DIAGNOSIS — E1149 Type 2 diabetes mellitus with other diabetic neurological complication: Secondary | ICD-10-CM | POA: Diagnosis not present

## 2012-11-13 DIAGNOSIS — E1142 Type 2 diabetes mellitus with diabetic polyneuropathy: Secondary | ICD-10-CM | POA: Diagnosis not present

## 2012-11-14 DIAGNOSIS — N2581 Secondary hyperparathyroidism of renal origin: Secondary | ICD-10-CM | POA: Diagnosis not present

## 2012-11-14 DIAGNOSIS — N186 End stage renal disease: Secondary | ICD-10-CM | POA: Diagnosis not present

## 2012-11-14 DIAGNOSIS — D631 Anemia in chronic kidney disease: Secondary | ICD-10-CM | POA: Diagnosis not present

## 2012-11-17 DIAGNOSIS — N186 End stage renal disease: Secondary | ICD-10-CM | POA: Diagnosis not present

## 2012-11-17 DIAGNOSIS — N2581 Secondary hyperparathyroidism of renal origin: Secondary | ICD-10-CM | POA: Diagnosis not present

## 2012-11-17 DIAGNOSIS — D631 Anemia in chronic kidney disease: Secondary | ICD-10-CM | POA: Diagnosis not present

## 2012-11-24 DIAGNOSIS — N186 End stage renal disease: Secondary | ICD-10-CM | POA: Diagnosis not present

## 2012-11-26 DIAGNOSIS — N186 End stage renal disease: Secondary | ICD-10-CM | POA: Diagnosis not present

## 2012-11-28 DIAGNOSIS — N186 End stage renal disease: Secondary | ICD-10-CM | POA: Diagnosis not present

## 2012-12-01 DIAGNOSIS — N186 End stage renal disease: Secondary | ICD-10-CM | POA: Diagnosis not present

## 2012-12-03 DIAGNOSIS — N186 End stage renal disease: Secondary | ICD-10-CM | POA: Diagnosis not present

## 2012-12-05 DIAGNOSIS — N186 End stage renal disease: Secondary | ICD-10-CM | POA: Diagnosis not present

## 2012-12-08 DIAGNOSIS — N186 End stage renal disease: Secondary | ICD-10-CM | POA: Diagnosis not present

## 2012-12-10 DIAGNOSIS — N186 End stage renal disease: Secondary | ICD-10-CM | POA: Diagnosis not present

## 2012-12-11 DIAGNOSIS — N186 End stage renal disease: Secondary | ICD-10-CM | POA: Diagnosis not present

## 2012-12-12 DIAGNOSIS — N186 End stage renal disease: Secondary | ICD-10-CM | POA: Diagnosis not present

## 2012-12-15 DIAGNOSIS — N186 End stage renal disease: Secondary | ICD-10-CM | POA: Diagnosis not present

## 2012-12-17 DIAGNOSIS — I4891 Unspecified atrial fibrillation: Secondary | ICD-10-CM | POA: Diagnosis not present

## 2012-12-30 ENCOUNTER — Ambulatory Visit (INDEPENDENT_AMBULATORY_CARE_PROVIDER_SITE_OTHER): Payer: Medicare Other | Admitting: General Practice

## 2012-12-30 ENCOUNTER — Ambulatory Visit (INDEPENDENT_AMBULATORY_CARE_PROVIDER_SITE_OTHER): Payer: Medicare Other | Admitting: Internal Medicine

## 2012-12-30 ENCOUNTER — Other Ambulatory Visit (INDEPENDENT_AMBULATORY_CARE_PROVIDER_SITE_OTHER): Payer: Medicare Other

## 2012-12-30 ENCOUNTER — Encounter: Payer: Self-pay | Admitting: Internal Medicine

## 2012-12-30 VITALS — BP 120/72 | HR 85 | Temp 99.1°F | Ht 69.0 in | Wt 277.0 lb

## 2012-12-30 DIAGNOSIS — E785 Hyperlipidemia, unspecified: Secondary | ICD-10-CM

## 2012-12-30 DIAGNOSIS — N320 Bladder-neck obstruction: Secondary | ICD-10-CM

## 2012-12-30 DIAGNOSIS — I6350 Cerebral infarction due to unspecified occlusion or stenosis of unspecified cerebral artery: Secondary | ICD-10-CM

## 2012-12-30 DIAGNOSIS — I639 Cerebral infarction, unspecified: Secondary | ICD-10-CM

## 2012-12-30 DIAGNOSIS — N186 End stage renal disease: Secondary | ICD-10-CM | POA: Insufficient documentation

## 2012-12-30 DIAGNOSIS — I4891 Unspecified atrial fibrillation: Secondary | ICD-10-CM

## 2012-12-30 DIAGNOSIS — E1142 Type 2 diabetes mellitus with diabetic polyneuropathy: Secondary | ICD-10-CM

## 2012-12-30 DIAGNOSIS — E1140 Type 2 diabetes mellitus with diabetic neuropathy, unspecified: Secondary | ICD-10-CM

## 2012-12-30 DIAGNOSIS — M25579 Pain in unspecified ankle and joints of unspecified foot: Secondary | ICD-10-CM

## 2012-12-30 DIAGNOSIS — E1149 Type 2 diabetes mellitus with other diabetic neurological complication: Secondary | ICD-10-CM | POA: Diagnosis not present

## 2012-12-30 DIAGNOSIS — I10 Essential (primary) hypertension: Secondary | ICD-10-CM

## 2012-12-30 DIAGNOSIS — IMO0002 Reserved for concepts with insufficient information to code with codable children: Secondary | ICD-10-CM

## 2012-12-30 DIAGNOSIS — I2789 Other specified pulmonary heart diseases: Secondary | ICD-10-CM

## 2012-12-30 DIAGNOSIS — Z7901 Long term (current) use of anticoagulants: Secondary | ICD-10-CM | POA: Insufficient documentation

## 2012-12-30 DIAGNOSIS — H9193 Unspecified hearing loss, bilateral: Secondary | ICD-10-CM | POA: Insufficient documentation

## 2012-12-30 DIAGNOSIS — I272 Pulmonary hypertension, unspecified: Secondary | ICD-10-CM

## 2012-12-30 DIAGNOSIS — Z0000 Encounter for general adult medical examination without abnormal findings: Secondary | ICD-10-CM

## 2012-12-30 DIAGNOSIS — G4733 Obstructive sleep apnea (adult) (pediatric): Secondary | ICD-10-CM | POA: Insufficient documentation

## 2012-12-30 DIAGNOSIS — H409 Unspecified glaucoma: Secondary | ICD-10-CM | POA: Insufficient documentation

## 2012-12-30 DIAGNOSIS — M1990 Unspecified osteoarthritis, unspecified site: Secondary | ICD-10-CM | POA: Insufficient documentation

## 2012-12-30 DIAGNOSIS — I509 Heart failure, unspecified: Secondary | ICD-10-CM

## 2012-12-30 DIAGNOSIS — M179 Osteoarthritis of knee, unspecified: Secondary | ICD-10-CM

## 2012-12-30 DIAGNOSIS — H9190 Unspecified hearing loss, unspecified ear: Secondary | ICD-10-CM

## 2012-12-30 DIAGNOSIS — Z992 Dependence on renal dialysis: Secondary | ICD-10-CM | POA: Insufficient documentation

## 2012-12-30 DIAGNOSIS — I480 Paroxysmal atrial fibrillation: Secondary | ICD-10-CM | POA: Insufficient documentation

## 2012-12-30 DIAGNOSIS — M1710 Unilateral primary osteoarthritis, unspecified knee: Secondary | ICD-10-CM

## 2012-12-30 DIAGNOSIS — E11319 Type 2 diabetes mellitus with unspecified diabetic retinopathy without macular edema: Secondary | ICD-10-CM

## 2012-12-30 HISTORY — DX: Pulmonary hypertension, unspecified: I27.20

## 2012-12-30 HISTORY — DX: Morbid (severe) obesity due to excess calories: E66.01

## 2012-12-30 HISTORY — DX: Obstructive sleep apnea (adult) (pediatric): G47.33

## 2012-12-30 HISTORY — DX: Unspecified atrial fibrillation: I48.91

## 2012-12-30 HISTORY — DX: Type 2 diabetes mellitus with unspecified diabetic retinopathy without macular edema: E11.319

## 2012-12-30 HISTORY — DX: Heart failure, unspecified: I50.9

## 2012-12-30 LAB — BASIC METABOLIC PANEL
BUN: 45 mg/dL — ABNORMAL HIGH (ref 6–23)
GFR: 7.61 mL/min — CL (ref >60.00)
Potassium: 4.2 mEq/L (ref 3.5–5.1)
Sodium: 134 mEq/L — ABNORMAL LOW (ref 135–145)

## 2012-12-30 LAB — CBC WITH DIFFERENTIAL/PLATELET
Basophils Absolute: 0.1 10*3/uL (ref 0.0–0.1)
Eosinophils Relative: 3.1 % (ref 0.0–5.0)
HCT: 37.4 % — ABNORMAL LOW (ref 39.0–52.0)
Hemoglobin: 12.3 g/dL — ABNORMAL LOW (ref 13.0–17.0)
Lymphs Abs: 1.6 10*3/uL (ref 0.7–4.0)
MCHC: 32.9 g/dL (ref 30.0–36.0)
MCV: 95.8 fl (ref 78.0–100.0)
Monocytes Absolute: 0.9 10*3/uL (ref 0.1–1.0)
Neutro Abs: 4.6 10*3/uL (ref 1.4–7.7)
Neutrophils Relative %: 62.7 % (ref 43.0–77.0)
Platelets: 203 10*3/uL (ref 150.0–400.0)
WBC: 7.3 10*3/uL (ref 4.5–10.5)

## 2012-12-30 LAB — URINALYSIS, ROUTINE W REFLEX MICROSCOPIC
Nitrite: NEGATIVE
Specific Gravity, Urine: >=1.03 (ref 1.000–1.030)
Total Protein, Urine: 100
Urine Glucose: NEGATIVE
pH: 5 (ref 5.0–8.0)

## 2012-12-30 LAB — PSA: PSA: 3.5 ng/mL (ref 0.10–4.00)

## 2012-12-30 LAB — HEPATIC FUNCTION PANEL
AST: 11 U/L (ref 0–37)
Alkaline Phosphatase: 84 U/L (ref 39–117)
Bilirubin, Direct: 0.1 mg/dL (ref 0.0–0.3)
Total Bilirubin: 0.7 mg/dL (ref 0.3–1.2)

## 2012-12-30 LAB — LIPID PANEL
LDL Cholesterol: 54 mg/dL (ref 0–99)
VLDL: 32.2 mg/dL (ref 0.0–40.0)

## 2012-12-30 LAB — TSH: TSH: 1.7 u[IU]/mL (ref 0.35–5.50)

## 2012-12-30 MED ORDER — TRAMADOL HCL 50 MG PO TABS: 50.0000 mg | ORAL_TABLET | Freq: Three times a day (TID) | ORAL | Status: DC | PRN

## 2012-12-30 NOTE — Assessment & Plan Note (Signed)
Improved s/p irrigation 

## 2012-12-30 NOTE — Patient Instructions (Addendum)
Your ears were irrigated of wax impactions today  Please sign the Release forms to have records sent from Dr Reece Levy, Nephrologist at [facility], [place], [place], as well as your other physicians as we can  Please continue all other medications as before  Please let Shirlean Mylar know of any refills you need today  Please have the pharmacy call with any other refills you may need.  Please go to the LAB in the Basement (turn left off the elevator) for the tests to be done today You will be contacted by phone if any changes need to be made immediately.  Otherwise, you will receive a letter about your results with an explanation, but please check with MyChart first.  You will be contacted regarding the referral for: cardiology, pulmonary, opthomology, podiatry, orthopedic  Please keep your appointments with your specialists as you have planned - nephrology  Please return in 6 months, or sooner if needed

## 2012-12-30 NOTE — Assessment & Plan Note (Signed)
stable overall by history and exam, and pt to continue medical treatment as before,  to f/u any worsening symptoms or concerns 

## 2012-12-30 NOTE — Progress Notes (Signed)
Pre-visit discussion using our clinic review tool. No additional management support is needed unless otherwise documented below in the visit note.  

## 2012-12-30 NOTE — Addendum Note (Signed)
Addended by: Biagio Borg on: 12/30/2012 10:18 AM   Modules accepted: Orders

## 2012-12-30 NOTE — Assessment & Plan Note (Signed)
stable overall by history and exam,  and pt to continue medical treatment as before,  to f/u any worsening symptoms or concerns  Note:  Total time for pt hx, exam, review of record with pt in the room, determination of diagnoses and plan for further eval and tx is > 40 min, with over 50% spent in coordination and counseling of patient

## 2012-12-30 NOTE — Assessment & Plan Note (Signed)
stable overall by history and exam, recent data reviewed with pt, and pt to continue medical treatment as before,  to f/u any worsening symptoms or concerns BP Readings from Last 3 Encounters:  12/30/12 120/72

## 2012-12-30 NOTE — Progress Notes (Signed)
Subjective:    Patient ID: Daniel Whitney, male    DOB: February 07, 1956, 57 y.o.   MRN: [id number]  HPI  Here to establish new MD;  Overall doing ok;  Pt denies CP, worsening SOB, DOE, wheezing, orthopnea, PND, worsening LE edema, palpitations, dizziness or syncope.  Pt denies neurological change such as new headache, facial or extremity weakness.  Pt denies polydipsia, polyuria, or low sugar symptoms. Pt states overall good compliance with treatment and medications, good tolerability, and has been trying to follow lower cholesterol diet.  Pt denies worsening depressive symptoms, suicidal ideation or panic. No fever, night sweats, wt loss, loss of appetite, or other constitutional symptoms.  Pt states good ability with ADL's, has low fall risk, home safety reviewed and adequate, no other significant changes in hearing or vision, and only occasionally active with exercise. Family lives in [place], he retired from [employer] ([employer], was present at 11/10/2022 event, friends died) now decided to stay in [place]. Has numerous specialists in [place], [place] MD has been nephrologist Dr Nicola Girt with his dialysis M-W-F, has f/u with local nephrology this wed, not sure of name.  Due for colonoscopy. Has been rec'd for TKR bilateral but has not yet had card clearance done.  Needs ortho soon as well for ongoing care. Incidentally with bilat hearing loss, likely due to wax Past Medical History  Diagnosis Date  . Arthritis   . Diabetes mellitus with neuropathy   . Glaucoma   . Hypertension   . ESRD (end stage renal disease) on dialysis   . Stroke   . Hyperlipidemia   . Atrial fibrillation 12/30/2012  . CHF (congestive heart failure) 12/30/2012  . Diabetic retinopathy 12/30/2012  . Morbid obesity 12/30/2012  . OSA (obstructive sleep apnea) 12/30/2012  . Pulmonary hypertension 12/30/2012   Past Surgical History  Procedure Laterality Date  . Right hip replacement  1980    reports that he has never smoked. He does not have any  smokeless tobacco history on file. He reports that he drinks alcohol. He reports that he does not use illicit drugs. family history includes Arthritis in his other; Diabetes in his other; Heart disease in his other; Hyperlipidemia in his other; Hypertension in his other; Kidney disease in his other; Stroke in his other. No Known Allergies No current outpatient prescriptions on file prior to visit.   No current facility-administered medications on file prior to visit.   Review of Systems  Constitutional: Negative for unexpected weight change, or unusual diaphoresis  HENT: Negative for tinnitus.   Eyes: Negative for photophobia and visual disturbance.  Respiratory: Negative for choking and stridor.   Gastrointestinal: Negative for vomiting and blood in stool.  Genitourinary: Negative for hematuria and decreased urine volume.  Musculoskeletal: Negative for acute joint swelling Skin: Negative for color change and wound.  Neurological: Negative for tremors and numbness other than noted  Psychiatric/Behavioral: Negative for decreased concentration or  hyperactivity.       Objective:   Physical Exam BP 120/72  Pulse 85  Temp(Src) 99.1 F (37.3 C) (Oral)  Ht 5\' 9"  (1.753 m)  Wt 277 lb (125.646 kg)  BMI 40.89 kg/m2  SpO2 97% VS noted,  Constitutional: Pt appears well-developed and well-nourished/obese.  HENT: Head: NCAT.  Right Ear: External ear normal.  Left Ear: External ear normal.  Eyes: Conjunctivae and EOM are normal. Pupils are equal, round, and reactive to light.  Hearing improved with irrigation bilat ear canals Neck: Normal range of motion. Neck supple.  Cardiovascular: Normal rate and regular rhythm.   Pulmonary/Chest: Effort normal and breath sounds normal.  Abd:  Soft, NT, non-distended, + BS Neurological: Pt is alert. Not confused  Skin: Skin is warm. No erythema. No LE edema Left arm fistula with thrill Bilat knees warm, crepitus, decrased ROM Psychiatric: Pt  behavior is normal. Thought content normal.     Assessment & Plan:

## 2012-12-31 ENCOUNTER — Encounter: Payer: Self-pay | Admitting: Internal Medicine

## 2013-01-01 ENCOUNTER — Ambulatory Visit: Payer: Medicare Other

## 2013-01-01 ENCOUNTER — Encounter: Payer: Self-pay | Admitting: Internal Medicine

## 2013-01-01 DIAGNOSIS — IMO0001 Reserved for inherently not codable concepts without codable children: Secondary | ICD-10-CM

## 2013-01-01 LAB — HEMOGLOBIN A1C: Hgb A1c MFr Bld: 7.3 % — ABNORMAL HIGH (ref 4.6–6.5)

## 2013-01-11 DIAGNOSIS — N186 End stage renal disease: Secondary | ICD-10-CM | POA: Diagnosis not present

## 2013-01-12 ENCOUNTER — Ambulatory Visit: Payer: Medicare Other | Admitting: Internal Medicine

## 2013-01-12 DIAGNOSIS — N186 End stage renal disease: Secondary | ICD-10-CM | POA: Diagnosis not present

## 2013-01-13 ENCOUNTER — Ambulatory Visit: Payer: Medicare Other | Admitting: Internal Medicine

## 2013-01-15 ENCOUNTER — Institutional Professional Consult (permissible substitution): Payer: Medicare Other | Admitting: Pulmonary Disease

## 2013-01-20 ENCOUNTER — Encounter: Payer: Self-pay | Admitting: Pulmonary Disease

## 2013-01-22 ENCOUNTER — Ambulatory Visit (INDEPENDENT_AMBULATORY_CARE_PROVIDER_SITE_OTHER): Payer: Medicare Other | Admitting: General Practice

## 2013-01-22 ENCOUNTER — Other Ambulatory Visit: Payer: Self-pay | Admitting: General Practice

## 2013-01-22 DIAGNOSIS — I6350 Cerebral infarction due to unspecified occlusion or stenosis of unspecified cerebral artery: Secondary | ICD-10-CM

## 2013-01-22 DIAGNOSIS — I2789 Other specified pulmonary heart diseases: Secondary | ICD-10-CM

## 2013-01-22 DIAGNOSIS — Z7901 Long term (current) use of anticoagulants: Secondary | ICD-10-CM | POA: Diagnosis not present

## 2013-01-22 DIAGNOSIS — I4891 Unspecified atrial fibrillation: Secondary | ICD-10-CM | POA: Diagnosis not present

## 2013-01-22 DIAGNOSIS — I272 Pulmonary hypertension, unspecified: Secondary | ICD-10-CM

## 2013-01-22 DIAGNOSIS — I639 Cerebral infarction, unspecified: Secondary | ICD-10-CM

## 2013-01-22 LAB — POCT INR: INR: 1.9

## 2013-01-22 MED ORDER — WARFARIN SODIUM 3 MG PO TABS: ORAL_TABLET | ORAL | Status: DC

## 2013-01-22 NOTE — Progress Notes (Signed)
Pre-visit discussion using our clinic review tool. No additional management support is needed unless otherwise documented below in the visit note.  

## 2013-02-03 DIAGNOSIS — I4891 Unspecified atrial fibrillation: Secondary | ICD-10-CM | POA: Diagnosis not present

## 2013-02-11 DIAGNOSIS — N186 End stage renal disease: Secondary | ICD-10-CM | POA: Diagnosis not present

## 2013-02-13 DIAGNOSIS — N186 End stage renal disease: Secondary | ICD-10-CM | POA: Diagnosis not present

## 2013-02-26 ENCOUNTER — Encounter: Payer: Self-pay | Admitting: Internal Medicine

## 2013-03-10 ENCOUNTER — Ambulatory Visit (INDEPENDENT_AMBULATORY_CARE_PROVIDER_SITE_OTHER): Payer: Medicare Other | Admitting: General Practice

## 2013-03-10 DIAGNOSIS — I639 Cerebral infarction, unspecified: Secondary | ICD-10-CM

## 2013-03-10 DIAGNOSIS — Z01810 Encounter for preprocedural cardiovascular examination: Secondary | ICD-10-CM | POA: Insufficient documentation

## 2013-03-10 DIAGNOSIS — I4891 Unspecified atrial fibrillation: Secondary | ICD-10-CM

## 2013-03-10 DIAGNOSIS — I6350 Cerebral infarction due to unspecified occlusion or stenosis of unspecified cerebral artery: Secondary | ICD-10-CM | POA: Diagnosis not present

## 2013-03-10 DIAGNOSIS — I272 Pulmonary hypertension, unspecified: Secondary | ICD-10-CM

## 2013-03-10 DIAGNOSIS — Z5181 Encounter for therapeutic drug level monitoring: Secondary | ICD-10-CM

## 2013-03-10 DIAGNOSIS — I2789 Other specified pulmonary heart diseases: Secondary | ICD-10-CM

## 2013-03-10 DIAGNOSIS — Z7901 Long term (current) use of anticoagulants: Secondary | ICD-10-CM

## 2013-03-10 LAB — POCT INR: INR: 2

## 2013-03-10 NOTE — Progress Notes (Signed)
Pre-visit discussion using our clinic review tool. No additional management support is needed unless otherwise documented below in the visit note.  

## 2013-03-11 DIAGNOSIS — I4891 Unspecified atrial fibrillation: Secondary | ICD-10-CM | POA: Diagnosis not present

## 2013-03-14 DIAGNOSIS — N186 End stage renal disease: Secondary | ICD-10-CM | POA: Diagnosis not present

## 2013-03-16 DIAGNOSIS — D631 Anemia in chronic kidney disease: Secondary | ICD-10-CM | POA: Diagnosis not present

## 2013-03-16 DIAGNOSIS — D509 Iron deficiency anemia, unspecified: Secondary | ICD-10-CM | POA: Diagnosis not present

## 2013-03-16 DIAGNOSIS — N186 End stage renal disease: Secondary | ICD-10-CM | POA: Diagnosis not present

## 2013-03-17 ENCOUNTER — Encounter: Payer: Self-pay | Admitting: Emergency Medicine

## 2013-03-17 ENCOUNTER — Ambulatory Visit (INDEPENDENT_AMBULATORY_CARE_PROVIDER_SITE_OTHER): Payer: Medicare Other | Admitting: Emergency Medicine

## 2013-03-17 VITALS — BP 118/74 | HR 86 | Ht 69.0 in | Wt 280.9 lb

## 2013-03-17 DIAGNOSIS — I2789 Other specified pulmonary heart diseases: Secondary | ICD-10-CM

## 2013-03-17 DIAGNOSIS — L84 Corns and callosities: Secondary | ICD-10-CM | POA: Diagnosis not present

## 2013-03-17 DIAGNOSIS — I272 Pulmonary hypertension, unspecified: Secondary | ICD-10-CM

## 2013-03-17 DIAGNOSIS — E1149 Type 2 diabetes mellitus with other diabetic neurological complication: Secondary | ICD-10-CM | POA: Diagnosis not present

## 2013-03-17 DIAGNOSIS — M2010 Hallux valgus (acquired), unspecified foot: Secondary | ICD-10-CM | POA: Diagnosis not present

## 2013-03-17 DIAGNOSIS — Z992 Dependence on renal dialysis: Secondary | ICD-10-CM

## 2013-03-17 DIAGNOSIS — L608 Other nail disorders: Secondary | ICD-10-CM | POA: Diagnosis not present

## 2013-03-17 DIAGNOSIS — N186 End stage renal disease: Secondary | ICD-10-CM | POA: Diagnosis not present

## 2013-03-17 NOTE — Assessment & Plan Note (Signed)
Followed by Dr Coladonato 

## 2013-03-17 NOTE — Patient Instructions (Signed)
We will repeat your echocardiogram here to compare with your last study from [place] for any signs of sleep apnea. If these appear then we should probably repeat your sleep test.  Continue your medications for your blood pressure and atrial fibrillation.  Follow with Dr Marval Regal regarding your dry weight.  Follow with Dr Lamonte Sakai in 2 months

## 2013-03-17 NOTE — Progress Notes (Signed)
Subjective:    Patient ID: Daniel Whitney, male    DOB: Nov 23, 1955, 58 y.o.   MRN: Daniel Whitney  HPI 58 yo man, never smoker, HTN, A Fib (on amio and coumadin), reported CHF, obesity with OSA (wore CPAP x 2 yrs, lost wt and had a repeat PSG that showed no longer needed CPAP, stopped it in 2005), DM, ESRD on HD (Coladonato). Has had TTE's in [place], Dr Edgardo Roys, most recently from Feb 2014.   He has minimal edema, tolerates HD with some hypotension. He denies breathing difficulty. He denies nocturnal awakenings. Some snoring and talking in sleep. No naps. Well rested in the am.    Review of Systems  Constitutional: Negative for fever and unexpected weight change.  HENT: Positive for postnasal drip. Negative for congestion, dental problem, ear pain, nosebleeds, rhinorrhea, sinus pressure, sneezing, sore throat and trouble swallowing.   Eyes: Negative for redness and itching.  Respiratory: Positive for shortness of breath and wheezing. Negative for cough and chest tightness.   Cardiovascular: Negative for palpitations and leg swelling.  Gastrointestinal: Negative for nausea and vomiting.  Genitourinary: Negative for dysuria.  Musculoskeletal: Negative for joint swelling.  Skin: Negative for rash.  Neurological: Negative for headaches.  Hematological: Bruises/bleeds easily.  Psychiatric/Behavioral: Negative for dysphoric mood. The patient is not nervous/anxious.     Past Medical History  Diagnosis Date  . Arthritis   . Diabetes mellitus with neuropathy   . Glaucoma   . Hypertension   . ESRD (end stage renal disease) on dialysis   . Stroke   . Hyperlipidemia   . Atrial fibrillation 12/30/2012  . CHF (congestive heart failure) 12/30/2012  . Diabetic retinopathy 12/30/2012  . Morbid obesity 12/30/2012  . OSA (obstructive sleep apnea) 12/30/2012  . Pulmonary hypertension 12/30/2012     Family History  Problem Relation Age of Onset  . Arthritis Other   . Heart disease Other   .  Hyperlipidemia Other   . Stroke Other   . Hypertension Other   . Kidney disease Other   . Diabetes Other      History   Social History  . Marital Status: Divorced    Spouse Name: N/A    Number of Children: N/A  . Years of Education: N/A   Occupational History  . Not on file.   Social History Main Topics  . Smoking status: Never Smoker   . Smokeless tobacco: Not on file  . Alcohol Use: Yes  . Drug Use: No  . Sexual Activity: Not on file   Other Topics Concern  . Not on file   Social History Narrative  . No narrative on file  Has worked in [employer], occupational in [place].  No military No TB exposure.   No Known Allergies   Outpatient Prescriptions Prior to Visit  Medication Sig Dispense Refill  . allopurinol (ZYLOPRIM) 100 MG tablet Take 100 mg by mouth 2 (two) times daily.      Marland Kitchen amiodarone (PACERONE) 200 MG tablet Take 200 mg by mouth daily.      Marland Kitchen amitriptyline (ELAVIL) 25 MG tablet Take 25 mg by mouth at bedtime.      . carvedilol (COREG) 25 MG tablet Take 25 mg by mouth 2 (two) times daily with a meal.      . Multiple Vitamin (MULTIVITAMIN) tablet Take 1 tablet by mouth daily.      . sevelamer carbonate (RENVELA) 800 MG tablet Take 800 mg by mouth 2 (two) times daily as needed.      Marland Kitchen  simvastatin (ZOCOR) 20 MG tablet Take 20 mg by mouth daily.      . sitaGLIPtin (JANUVIA) 25 MG tablet Take 25 mg by mouth daily.      . traMADol (ULTRAM) 50 MG tablet Take 1 tablet (50 mg total) by mouth every 8 (eight) hours as needed.  120 tablet  2  . valsartan (DIOVAN) 40 MG tablet Take 40 mg by mouth 2 (two) times daily.      Marland Kitchen warfarin (COUMADIN) 3 MG tablet Take as directed by anticoagulation lcinic  40 tablet  2   No facility-administered medications prior to visit.         Objective:   Physical Exam Filed Vitals:   03/17/13 1607  BP: 118/74  Pulse: 86  Height: 5\' 9"  (1.753 m)  Weight: 280 lb 14.4 oz (127.415 kg)  SpO2: 98%  Gen: Pleasant, well-nourished, in no distress,   normal affect  ENT: No lesions,  mouth clear,  oropharynx clear, no postnasal drip  Neck: No JVD, no TMG, no carotid bruits  Lungs: No use of accessory muscles, no dullness to percussion, clear without rales or rhonchi  Cardiovascular: RRR, heart sounds normal, no murmur or gallops, no peripheral edema  Musculoskeletal: No deformities, no cyanosis or clubbing  Neuro: alert, non focal  Skin: Warm, no lesions or rashes     Assessment & Plan:  ESRD (end stage renal disease) on dialysis Followed by Dr Marval Regal  Pulmonary hypertension Repeat TTE now and obtain notes from [facility] Discussed with him that he is at risk for recurrence of OSA - will repeat PSG if sx appear

## 2013-03-17 NOTE — Assessment & Plan Note (Signed)
Repeat TTE now and obtain notes from [facility] Discussed with him that he is at risk for recurrence of OSA - will repeat PSG if sx appear

## 2013-03-18 ENCOUNTER — Encounter: Payer: Self-pay | Admitting: Internal Medicine

## 2013-03-18 ENCOUNTER — Ambulatory Visit (INDEPENDENT_AMBULATORY_CARE_PROVIDER_SITE_OTHER): Payer: Medicare Other | Admitting: Internal Medicine

## 2013-03-18 VITALS — BP 100/62 | HR 73 | Ht 69.0 in | Wt 278.0 lb

## 2013-03-18 DIAGNOSIS — I509 Heart failure, unspecified: Secondary | ICD-10-CM

## 2013-03-18 DIAGNOSIS — E785 Hyperlipidemia, unspecified: Secondary | ICD-10-CM

## 2013-03-18 DIAGNOSIS — I10 Essential (primary) hypertension: Secondary | ICD-10-CM

## 2013-03-18 DIAGNOSIS — I4891 Unspecified atrial fibrillation: Secondary | ICD-10-CM

## 2013-03-18 DIAGNOSIS — Z7901 Long term (current) use of anticoagulants: Secondary | ICD-10-CM

## 2013-03-18 NOTE — Progress Notes (Signed)
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 He was last assigned to work in the [employer]. He does have family members in [place] and after retirement moved to this area to be closer to his family. Unfortunately, he has a significant past medical history including heart failure, with a cardiac catheterization in the past that apparently showed some coronary artery disease, but he denies ever having an MI or prior stenting. He also has a history of hypertension, dyslipidemia, end-stage renal disease and has been on dialysis for 2 years, and atrial fibrillation on warfarin. He has had a prior stroke and has diabetes, but is not on insulin. He is also morbidly obese. His Coumadin levels are now being followed by his primary care provider Dr. Jenny Reichmann, and he reports his last INR was 2.1.  With regards to atrial fibrillation he says these had it for approximately 2-3 years.  He initially underwent a TEE cardioversion which converted him to sinus for short period of time and then ultimately went back into atrial fibrillation. I believe he was started on amiodarone at this time and underwent a subsequent cardioversion which was not successful.  He has been maintained on amiodarone, but believes that he has been in atrial fibrillation for some time. He reported to me that he may have felt a little more energy when he was in sinus rhythm but it is difficult for him to tell. He is currently denying any chest pain or worsening shortness of breath. He reports his weight varies somewhat from week to week and is generally managed at dialysis. He is aneuric. The degree of his cardiomyopathy is unknown at this time and we will request records from his prior cardiologist (Dr. Royanne Foots) and electrophysiologist (Dr. Leonia Reeves) in the [place].  PMHx:    Past Medical History  Diagnosis Date  . Arthritis   . Diabetes mellitus with neuropathy   . Glaucoma   . Hypertension   . ESRD (end stage renal disease) on dialysis   . Stroke   . Hyperlipidemia   . Atrial fibrillation 12/30/2012  . CHF (congestive heart failure) 12/30/2012  . Diabetic retinopathy 12/30/2012  . Morbid obesity 12/30/2012  . OSA (obstructive sleep apnea) 12/30/2012  . Pulmonary hypertension 12/30/2012    Past Surgical History  Procedure Laterality Date  . Total hip arthroplasty Right 1980  . Av fistula placement Left     FAMHx:  Family History  Problem Relation Age of Onset  . Arthritis Other   . Heart disease Other   . Hyperlipidemia Other   . Stroke Other   . Hypertension Other   . Kidney disease Other   . Diabetes Other     SOCHx:   reports that he has never smoked. He has never used smokeless tobacco. He reports that he drinks about 1.5 ounces of alcohol per week. He reports that he does not use illicit drugs.  ALLERGIES:  No Known Allergies  ROS: A comprehensive review of systems was negative except for: Respiratory: positive for dyspnea on exertion Cardiovascular: positive for lower extremity edema Genitourinary: positive for ESRD, anuric  HOME MEDS: Current Outpatient Prescriptions  Medication Sig Dispense Refill  . allopurinol (ZYLOPRIM) 100 MG tablet Take 100 mg by mouth 2 (two) times daily.      Marland Kitchen amiodarone (PACERONE) 200 MG  tablet Take 200 mg by mouth daily.      Marland Kitchen amitriptyline (ELAVIL) 25 MG tablet Take 25 mg by mouth at bedtime.      Marland Kitchen b complex-vitamin c-folic acid (NEPHRO-VITE) 0.8 MG TABS tablet Take 1 tablet by mouth daily.      . carvedilol (COREG) 25 MG tablet Take 25 mg by mouth 2 (two) times daily with a meal.      . clindamycin (CLEOCIN T) 1 % lotion Apply topically 2 (two) times daily.      . Eflornithine HCl (VANIQA) 13.9 % cream Apply topically 2 (two) times daily with a meal.      . OVER THE COUNTER MEDICATION 2  (two) times daily. Benzaderm Wash      . OVER THE COUNTER MEDICATION 1 application 2 (two) times daily. Melaquin Complex      . sevelamer carbonate (RENVELA) 800 MG tablet Take 800 mg by mouth 2 (two) times daily as needed.      . simvastatin (ZOCOR) 20 MG tablet Take 20 mg by mouth daily.      . sitaGLIPtin (JANUVIA) 25 MG tablet Take 25 mg by mouth daily.      . traMADol (ULTRAM) 50 MG tablet Take 1 tablet (50 mg total) by mouth every 8 (eight) hours as needed.  120 tablet  2  . valsartan (DIOVAN) 40 MG tablet Take 40 mg by mouth 4 (four) times a week.       . warfarin (COUMADIN) 3 MG tablet Take as directed by anticoagulation clinic       No current facility-administered medications for this visit.    LABS/IMAGING: No results found for this or any previous visit (from the past 48 hour(s)). No results found.  VITALS: BP 100/62  Pulse 73  Ht 5\' 9"  (1.753 m)  Wt 278 lb (126.1 kg)  BMI 41.03 kg/m2  EXAM: General appearance: alert, no distress and morbidly obese Neck: no carotid bruit, no JVD and thick Lungs: diminished breath sounds bibasilar Heart: irregularly irregular rhythm Abdomen: soft, non-tender; bowel sounds normal; no masses,  no organomegaly and morbidly obese Extremities: extremities normal, atraumatic, no cyanosis or edema Pulses: 2+ and symmetric Fistula in the left forearm - some surrounding hematoma, faint +thrill Skin: Skin color, texture, turgor normal. No rashes or lesions Neurologic: Grossly normal Psych: Normal mood, affect  EKG: Atrial fibrillation at 73  ASSESSMENT: 1. History of congestive heart failure 2. Persistent atrial fibrillation on amiodarone 3. Hypertension 4. Dyslipidemia 5. Morbid obesity 6. Pulmonary hypertension 7. DM2  PLAN: 1.   Daniel Whitney has a number of cardiac problems including a history of heart failure without known obstructive coronary disease or prior stenting. His ejection fraction is unknown at this time and will be  obtaining records from [place]. An echocardiogram is ready been ordered by Dr. Lamonte Sakai and we'll see if we can get that expedited.  A review of his cardiac medications shows that he may be on good medications, depending on what his ejection fraction is. With regards to his atrial fibrillation, this point he is persistent and may be in permanent atrial fibrillation. I would like to review records from his cardiac electrophysiologist to see the extent of attempts that have been performed to try to get him back to sinus rhythm. If it is the case it is more permanent, I did not feel that there is a role for continuing amiodarone as it would only increase his risk for toxicity.  I'll be in  contact with Daniel Whitney once I receive records and can better advise him of any changes in his medications if necessary.  Thank you kindly for the referral.  Pixie Casino, MD, [facility] Attending Cardiologist [facility]  HILTY,Kenneth C 03/18/2013, 12:38 PM

## 2013-03-18 NOTE — Patient Instructions (Signed)
Patient is scheduled for an echo at [facility] location on 2/26 - can we try to get this echo appointment earlier, at [facility] location if possible.   Your physician wants you to follow-up in: 6 months. You will receive a reminder letter in the mail two months in advance. If you don't receive a letter, please call our office to schedule the follow-up appointment.  We will try to get records from your cardiologists/specialists in [place].

## 2013-03-19 DIAGNOSIS — T82898A Other specified complication of vascular prosthetic devices, implants and grafts, initial encounter: Secondary | ICD-10-CM | POA: Diagnosis not present

## 2013-03-19 DIAGNOSIS — N186 End stage renal disease: Secondary | ICD-10-CM | POA: Diagnosis not present

## 2013-03-19 DIAGNOSIS — I871 Compression of vein: Secondary | ICD-10-CM | POA: Diagnosis not present

## 2013-03-20 ENCOUNTER — Other Ambulatory Visit: Payer: Self-pay | Admitting: General Practice

## 2013-03-20 MED ORDER — WARFARIN SODIUM 3 MG PO TABS: ORAL_TABLET | ORAL | Status: DC

## 2013-03-26 ENCOUNTER — Inpatient Hospital Stay (HOSPITAL_COMMUNITY): Admission: RE | Admit: 2013-03-26 | Payer: BC Managed Care – PPO | Source: Ambulatory Visit

## 2013-03-27 ENCOUNTER — Other Ambulatory Visit: Payer: Self-pay | Admitting: General Practice

## 2013-03-27 ENCOUNTER — Telehealth (HOSPITAL_COMMUNITY): Payer: Self-pay | Admitting: *Deleted

## 2013-03-27 MED ORDER — WARFARIN SODIUM 3 MG PO TABS: ORAL_TABLET | ORAL | Status: DC

## 2013-03-30 ENCOUNTER — Telehealth (HOSPITAL_COMMUNITY): Payer: Self-pay | Admitting: *Deleted

## 2013-04-01 ENCOUNTER — Telehealth (HOSPITAL_COMMUNITY): Payer: Self-pay | Admitting: *Deleted

## 2013-04-07 ENCOUNTER — Ambulatory Visit (INDEPENDENT_AMBULATORY_CARE_PROVIDER_SITE_OTHER): Payer: Medicare Other | Admitting: General Practice

## 2013-04-07 DIAGNOSIS — I272 Pulmonary hypertension, unspecified: Secondary | ICD-10-CM

## 2013-04-07 DIAGNOSIS — Z7901 Long term (current) use of anticoagulants: Secondary | ICD-10-CM | POA: Diagnosis not present

## 2013-04-07 DIAGNOSIS — I6350 Cerebral infarction due to unspecified occlusion or stenosis of unspecified cerebral artery: Secondary | ICD-10-CM | POA: Diagnosis not present

## 2013-04-07 DIAGNOSIS — I639 Cerebral infarction, unspecified: Secondary | ICD-10-CM

## 2013-04-07 DIAGNOSIS — I2789 Other specified pulmonary heart diseases: Secondary | ICD-10-CM | POA: Diagnosis not present

## 2013-04-07 DIAGNOSIS — I4891 Unspecified atrial fibrillation: Secondary | ICD-10-CM

## 2013-04-07 LAB — POCT INR: INR: 2.4

## 2013-04-07 NOTE — Progress Notes (Signed)
Pre visit review using our clinic review tool, if applicable. No additional management support is needed unless otherwise documented below in the visit note. 

## 2013-04-08 ENCOUNTER — Telehealth (HOSPITAL_COMMUNITY): Payer: Self-pay | Admitting: *Deleted

## 2013-04-08 DIAGNOSIS — I4891 Unspecified atrial fibrillation: Secondary | ICD-10-CM | POA: Diagnosis not present

## 2013-04-09 ENCOUNTER — Ambulatory Visit (HOSPITAL_COMMUNITY): Payer: BC Managed Care – PPO

## 2013-04-09 ENCOUNTER — Other Ambulatory Visit (HOSPITAL_COMMUNITY): Payer: Medicare Other

## 2013-04-11 DIAGNOSIS — N186 End stage renal disease: Secondary | ICD-10-CM | POA: Diagnosis not present

## 2013-04-13 DIAGNOSIS — N186 End stage renal disease: Secondary | ICD-10-CM | POA: Diagnosis not present

## 2013-04-14 ENCOUNTER — Other Ambulatory Visit (HOSPITAL_COMMUNITY): Payer: Self-pay | Admitting: Cardiovascular Disease

## 2013-04-14 DIAGNOSIS — I739 Peripheral vascular disease, unspecified: Secondary | ICD-10-CM

## 2013-04-16 ENCOUNTER — Other Ambulatory Visit (HOSPITAL_COMMUNITY): Payer: Self-pay | Admitting: Emergency Medicine

## 2013-04-16 ENCOUNTER — Ambulatory Visit (HOSPITAL_COMMUNITY)
Admission: RE | Admit: 2013-04-16 | Discharge: 2013-04-16 | Disposition: A | Discharge status: Home / Self Care | Payer: Medicare Other | Source: Ambulatory Visit | Attending: Emergency Medicine | Admitting: Emergency Medicine

## 2013-04-16 DIAGNOSIS — I369 Nonrheumatic tricuspid valve disorder, unspecified: Secondary | ICD-10-CM

## 2013-04-16 DIAGNOSIS — I272 Pulmonary hypertension, unspecified: Secondary | ICD-10-CM

## 2013-04-16 DIAGNOSIS — I2789 Other specified pulmonary heart diseases: Secondary | ICD-10-CM | POA: Insufficient documentation

## 2013-04-20 ENCOUNTER — Telehealth (HOSPITAL_COMMUNITY): Payer: Self-pay | Admitting: *Deleted

## 2013-04-21 ENCOUNTER — Telehealth (HOSPITAL_COMMUNITY): Payer: Self-pay | Admitting: *Deleted

## 2013-04-21 DIAGNOSIS — N2581 Secondary hyperparathyroidism of renal origin: Secondary | ICD-10-CM | POA: Diagnosis not present

## 2013-04-21 DIAGNOSIS — N186 End stage renal disease: Secondary | ICD-10-CM | POA: Diagnosis not present

## 2013-04-23 DIAGNOSIS — N186 End stage renal disease: Secondary | ICD-10-CM | POA: Diagnosis not present

## 2013-04-23 DIAGNOSIS — N2581 Secondary hyperparathyroidism of renal origin: Secondary | ICD-10-CM | POA: Diagnosis not present

## 2013-05-05 ENCOUNTER — Ambulatory Visit (INDEPENDENT_AMBULATORY_CARE_PROVIDER_SITE_OTHER): Payer: Medicare Other | Admitting: General Practice

## 2013-05-05 DIAGNOSIS — Z7901 Long term (current) use of anticoagulants: Secondary | ICD-10-CM

## 2013-05-05 DIAGNOSIS — I4891 Unspecified atrial fibrillation: Secondary | ICD-10-CM | POA: Diagnosis not present

## 2013-05-05 DIAGNOSIS — I2789 Other specified pulmonary heart diseases: Secondary | ICD-10-CM | POA: Diagnosis not present

## 2013-05-05 DIAGNOSIS — I639 Cerebral infarction, unspecified: Secondary | ICD-10-CM

## 2013-05-05 DIAGNOSIS — I272 Pulmonary hypertension, unspecified: Secondary | ICD-10-CM

## 2013-05-05 DIAGNOSIS — I6350 Cerebral infarction due to unspecified occlusion or stenosis of unspecified cerebral artery: Secondary | ICD-10-CM | POA: Diagnosis not present

## 2013-05-05 DIAGNOSIS — Z5181 Encounter for therapeutic drug level monitoring: Secondary | ICD-10-CM

## 2013-05-05 LAB — POCT INR: INR: 1.9

## 2013-05-05 NOTE — Progress Notes (Signed)
Pre visit review using our clinic review tool, if applicable. No additional management support is needed unless otherwise documented below in the visit note. 

## 2013-05-06 DIAGNOSIS — I4891 Unspecified atrial fibrillation: Secondary | ICD-10-CM | POA: Diagnosis not present

## 2013-05-12 DIAGNOSIS — N186 End stage renal disease: Secondary | ICD-10-CM | POA: Diagnosis not present

## 2013-05-13 DIAGNOSIS — N186 End stage renal disease: Secondary | ICD-10-CM | POA: Diagnosis not present

## 2013-05-26 DIAGNOSIS — N2581 Secondary hyperparathyroidism of renal origin: Secondary | ICD-10-CM | POA: Diagnosis not present

## 2013-05-26 DIAGNOSIS — N186 End stage renal disease: Secondary | ICD-10-CM | POA: Diagnosis not present

## 2013-05-28 DIAGNOSIS — N186 End stage renal disease: Secondary | ICD-10-CM | POA: Diagnosis not present

## 2013-05-28 DIAGNOSIS — N2581 Secondary hyperparathyroidism of renal origin: Secondary | ICD-10-CM | POA: Diagnosis not present

## 2013-05-30 DIAGNOSIS — N186 End stage renal disease: Secondary | ICD-10-CM | POA: Diagnosis not present

## 2013-05-30 DIAGNOSIS — N2581 Secondary hyperparathyroidism of renal origin: Secondary | ICD-10-CM | POA: Diagnosis not present

## 2013-06-02 ENCOUNTER — Ambulatory Visit (INDEPENDENT_AMBULATORY_CARE_PROVIDER_SITE_OTHER): Payer: Medicare Other | Admitting: General Practice

## 2013-06-02 DIAGNOSIS — Z7901 Long term (current) use of anticoagulants: Secondary | ICD-10-CM | POA: Diagnosis not present

## 2013-06-02 DIAGNOSIS — I4891 Unspecified atrial fibrillation: Secondary | ICD-10-CM

## 2013-06-02 DIAGNOSIS — I639 Cerebral infarction, unspecified: Secondary | ICD-10-CM

## 2013-06-02 DIAGNOSIS — I2789 Other specified pulmonary heart diseases: Secondary | ICD-10-CM | POA: Diagnosis not present

## 2013-06-02 DIAGNOSIS — I6350 Cerebral infarction due to unspecified occlusion or stenosis of unspecified cerebral artery: Secondary | ICD-10-CM

## 2013-06-02 DIAGNOSIS — I272 Pulmonary hypertension, unspecified: Secondary | ICD-10-CM

## 2013-06-02 DIAGNOSIS — Z5181 Encounter for therapeutic drug level monitoring: Secondary | ICD-10-CM

## 2013-06-02 LAB — POCT INR: INR: 2.3

## 2013-06-02 NOTE — Progress Notes (Signed)
Pre visit review using our clinic review tool, if applicable. No additional management support is needed unless otherwise documented below in the visit note. 

## 2013-06-03 DIAGNOSIS — I4891 Unspecified atrial fibrillation: Secondary | ICD-10-CM | POA: Diagnosis not present

## 2013-06-04 ENCOUNTER — Ambulatory Visit (INDEPENDENT_AMBULATORY_CARE_PROVIDER_SITE_OTHER): Payer: Medicare Other | Admitting: Internal Medicine

## 2013-06-04 ENCOUNTER — Ambulatory Visit (INDEPENDENT_AMBULATORY_CARE_PROVIDER_SITE_OTHER): Payer: Medicare Other | Admitting: Family Medicine

## 2013-06-04 ENCOUNTER — Encounter: Payer: Self-pay | Admitting: Family Medicine

## 2013-06-04 ENCOUNTER — Encounter: Payer: Self-pay | Admitting: Internal Medicine

## 2013-06-04 VITALS — BP 132/88 | HR 84 | Temp 98.2°F | Ht 69.0 in | Wt 274.0 lb

## 2013-06-04 VITALS — BP 132/88 | HR 84 | Ht 69.0 in | Wt 274.0 lb

## 2013-06-04 DIAGNOSIS — M1710 Unilateral primary osteoarthritis, unspecified knee: Secondary | ICD-10-CM | POA: Diagnosis not present

## 2013-06-04 DIAGNOSIS — E1149 Type 2 diabetes mellitus with other diabetic neurological complication: Secondary | ICD-10-CM

## 2013-06-04 DIAGNOSIS — I6350 Cerebral infarction due to unspecified occlusion or stenosis of unspecified cerebral artery: Secondary | ICD-10-CM

## 2013-06-04 DIAGNOSIS — M79609 Pain in unspecified limb: Secondary | ICD-10-CM

## 2013-06-04 DIAGNOSIS — R238 Other skin changes: Secondary | ICD-10-CM | POA: Diagnosis not present

## 2013-06-04 DIAGNOSIS — M79605 Pain in left leg: Principal | ICD-10-CM

## 2013-06-04 DIAGNOSIS — G5600 Carpal tunnel syndrome, unspecified upper limb: Secondary | ICD-10-CM

## 2013-06-04 DIAGNOSIS — E1140 Type 2 diabetes mellitus with diabetic neuropathy, unspecified: Secondary | ICD-10-CM

## 2013-06-04 DIAGNOSIS — M79604 Pain in right leg: Secondary | ICD-10-CM

## 2013-06-04 DIAGNOSIS — I10 Essential (primary) hypertension: Secondary | ICD-10-CM

## 2013-06-04 DIAGNOSIS — R209 Unspecified disturbances of skin sensation: Secondary | ICD-10-CM

## 2013-06-04 DIAGNOSIS — M79606 Pain in leg, unspecified: Secondary | ICD-10-CM

## 2013-06-04 DIAGNOSIS — E1142 Type 2 diabetes mellitus with diabetic polyneuropathy: Secondary | ICD-10-CM

## 2013-06-04 DIAGNOSIS — G4733 Obstructive sleep apnea (adult) (pediatric): Secondary | ICD-10-CM

## 2013-06-04 MED ORDER — AMITRIPTYLINE HCL 50 MG PO TABS: 50.0000 mg | ORAL_TABLET | Freq: Every day | ORAL | Status: DC

## 2013-06-04 MED ORDER — SITAGLIPTIN PHOSPHATE 25 MG PO TABS: 25.0000 mg | ORAL_TABLET | Freq: Every day | ORAL | Status: DC

## 2013-06-04 MED ORDER — TRAMADOL HCL 50 MG PO TABS: 50.0000 mg | ORAL_TABLET | Freq: Three times a day (TID) | ORAL | Status: DC | PRN

## 2013-06-04 NOTE — Progress Notes (Signed)
Pre visit review using our clinic review tool, if applicable. No additional management support is needed unless otherwise documented below in the visit note. 

## 2013-06-04 NOTE — Progress Notes (Signed)
[facility] [address], [place] [address] Phone: [phone] Subjective:    I'm seeing this patient by the request  of:  Cathlean Cower, MD   CC: Bilateral knee pain  [id number] Daniel Whitney is a 58 y.o. male coming in with complaint of bilateral knee pain. Patient does have a past medical history significant for morbid obesity, and diabetic retinopathy and diabetes, and stage renal disease on dialysis, as well as atrial fibrillation on chronic anticoagulant.  Patient has had this knee pain for multiple years. Patient had been seen a orthopedic surgeon back in [place]. They were attempting conservative therapy with steroid injections, physical therapy, as well as viscous supplementation which she has done 2 rounds of. Patient has failed all other conservative therapy and last injections for greater than 7 months ago he states. Patient states that the pain is starting again mostly over the medial joint line worse with activity better with rest. Patient try some icing with no significant improvement. Patient is not on any chronic narcotics. Patient rates the pain as 7 on a 10 in severity with some nighttime awakening. Patient does ambulate with a cane.     Past medical history, social, surgical and family history all reviewed in electronic medical record.   Review of Systems: No headache, visual changes, nausea, vomiting, diarrhea, constipation, dizziness, abdominal pain, skin rash, fevers, chills, night sweats, weight loss, swollen lymph nodes, body aches, joint swelling, muscle aches, chest pain, shortness of breath, mood changes.   Objective Blood pressure 132/88, pulse 84, height 5\' 9"  (1.753 m), weight 274 lb (124.286 kg).  General: No apparent distress alert and oriented x3 mood and affect normal, dressed appropriately. Morbid obesity.  HEENT: Pupils equal, extraocular movements intact patient though does not respond well to light in the right  eye Respiratory: Patient's speak in full sentences and does not appear short of breath  Cardiovascular: No lower extremity edema, non tender, no erythema  Skin: Warm dry intact with no signs of infection or rash on extremities or on axial skeleton.  Abdomen: Soft nontender  Neuro: Cranial nerves II through XII are intact, neurovascularly intact in all extremities with 2+ DTRs and 2+ pulses.  Lymph: No lymphadenopathy of posterior or anterior cervical chain or axillae bilaterally.  Gait normal with good balance and coordination.  MSK:  Non tender with full range of motion and good stability and symmetric strength and tone of shoulders, elbows, wrist, hip, and ankles bilaterally.  Knee: Bilateral On inspection significant osteoarthritic changes. Tender to palpation over the medial joint line bilaterally ROM is restricted lacking the last 10 of extension and only 95 of flexion bilaterally Ligaments with solid consistent endpoints including ACL, PCL, LCL, MCL. Non painful patellar compression. Patellar glide without crepitus. Patellar and quadriceps tendons unremarkable. Hamstring and quadriceps strength is normal.   Procedure: Real-time Ultrasound Guided Injection of right knee Device: GE Logiq E  Ultrasound guided injection is preferred based studies that show increased duration, increased effect, greater accuracy, decreased procedural pain, increased response rate, and decreased cost with ultrasound guided versus blind injection.  Verbal informed consent obtained.  Time-out conducted.  Noted no overlying erythema, induration, or other signs of local infection.  Skin prepped in a sterile fashion.  Local anesthesia: Topical Ethyl chloride.  With sterile technique and under real time ultrasound guidance: With a 22-gauge 2 inch needle patient was injected with 4 cc of 0.5% Marcaine and 1 cc of Kenalog 40 mg/dL. This  was from a superior lateral approach.  Completed without difficulty  Pain  immediately resolved suggesting accurate placement of the medication.  Advised to call if fevers/chills, erythema, induration, drainage, or persistent bleeding.  Images permanently stored and available for review in the ultrasound unit.  Impression: Technically successful ultrasound guided injection.  Procedure: Real-time Ultrasound Guided Injection of left knee Device: GE Logiq E  Ultrasound guided injection is preferred based studies that show increased duration, increased effect, greater accuracy, decreased procedural pain, increased response rate, and decreased cost with ultrasound guided versus blind injection.  Verbal informed consent obtained.  Time-out conducted.  Noted no overlying erythema, induration, or other signs of local infection.  Skin prepped in a sterile fashion.  Local anesthesia: Topical Ethyl chloride.  With sterile technique and under real time ultrasound guidance: With a 22-gauge 2 inch needle patient was injected with 4 cc of 0.5% Marcaine and 1 cc of Kenalog 40 mg/dL. This was from a superior lateral approach.  Completed without difficulty  Pain immediately resolved suggesting accurate placement of the medication.  Advised to call if fevers/chills, erythema, induration, drainage, or persistent bleeding.  Images permanently stored and available for review in the ultrasound unit.  Impression: Technically successful ultrasound guided injection. Body mass index is 40.44 kg/(m^2).    Impression and Recommendations:     This case required medical decision making of moderate complexity.

## 2013-06-04 NOTE — Patient Instructions (Signed)
Very nice to meet you Try exercises most days of the week.  Ice 20 minutes at the end of long days.  Take tylenol 650 mg three times a day is the best evidence based medicine we have for arthritis.  Glucosamine sulfate 750mg  twice a day is a supplement that has been shown to help moderate to severe arthritis. Vitamin D 2000 IU daily Tumeric 500mg  twice daily.  Capsaicin topically up to four times a day may also help with pain. If cortisone injections do not help, there are different types of shots that may help but they take longer to take effect.  We can discuss this at follow up.  It's important that you continue to stay active. Controlling your weight is important.  Consider physical therapy to strengthen muscles around the joint that hurts to take pressure off of the joint itself. Shoe inserts with good arch support may be helpful.  [employer] [facility] at [employer] [facility] could help.  Water aerobics and cycling with low resistance are the best two types of exercise for arthritis. Come back and see me in 3 weeks. If still in pain will start synvisc.

## 2013-06-04 NOTE — Progress Notes (Signed)
Subjective:    Patient ID: Daniel Whitney, male    DOB: [birth year], 58 y.o.   MRN: [id number]  HPI  Here to f/u, Pt denies chest pain, increased sob or doe, wheezing, orthopnea, PND, increased LE swelling, palpitations, dizziness or syncope.  Does c/o leg weakness, Has fallen twice in past yr, has known neuropathy, leads to balance issue, almost fell again yest, has cane for balance. Has increase pain recently, worse at night. ? Related to exertion as well - has not had recent LE dopplers.  Also had worsening bilat knee pain with recurrent swelling. Also with occas tingling to several fingers left hand, has a wrist splint at home but not using.  Also more snoring and daytime sleepiness/fatigue in past 1-2 month, sleeping in the exam room as i walk in, used to be more alert, thinkgs OSA may be back, has appt with Dr Barbara Cower soon. Past Medical History  Diagnosis Date  . Arthritis   . Diabetes mellitus with neuropathy   . Glaucoma   . Hypertension   . ESRD (end stage renal disease) on dialysis   . Stroke   . Hyperlipidemia   . Atrial fibrillation 12/30/2012  . CHF (congestive heart failure) 12/30/2012  . Diabetic retinopathy 12/30/2012  . Morbid obesity 12/30/2012  . OSA (obstructive sleep apnea) 12/30/2012  . Pulmonary hypertension 12/30/2012   Past Surgical History  Procedure Laterality Date  . Total hip arthroplasty Right 1980  . Av fistula placement Left     reports that he has never smoked. He has never used smokeless tobacco. He reports that he drinks about 1.5 - 2 ounces of alcohol per week. He reports that he does not use illicit drugs. family history includes Arthritis in his other; Diabetes in his other; Heart disease in his other; Hyperlipidemia in his other; Hypertension in his other; Kidney disease in his other; Stroke in his other. No Known Allergies Current Outpatient Prescriptions on File Prior to Visit  Medication Sig Dispense Refill  . allopurinol (ZYLOPRIM) 100 MG  tablet Take 100 mg by mouth 2 (two) times daily.      Marland Kitchen amiodarone (PACERONE) 200 MG tablet Take 200 mg by mouth daily.      Marland Kitchen b complex-vitamin c-folic acid (NEPHRO-VITE) 0.8 MG TABS tablet Take 1 tablet by mouth daily.      . carvedilol (COREG) 25 MG tablet Take 25 mg by mouth 2 (two) times daily with a meal.      . clindamycin (CLEOCIN T) 1 % lotion Apply topically 2 (two) times daily.      . Eflornithine HCl (VANIQA) 13.9 % cream Apply topically 2 (two) times daily with a meal.      . OVER THE COUNTER MEDICATION 2 (two) times daily. Benzaderm Wash      . OVER THE COUNTER MEDICATION 1 application 2 (two) times daily. Melaquin Complex      . sevelamer carbonate (RENVELA) 800 MG tablet Take 850 mg by mouth 3 (three) times daily as needed.       . simvastatin (ZOCOR) 20 MG tablet Take 20 mg by mouth daily.      . valsartan (DIOVAN) 40 MG tablet Take 40 mg by mouth 4 (four) times a week.       . warfarin (COUMADIN) 3 MG tablet Take as directed by anticoagulation clinic  105 tablet  1   No current facility-administered medications on file prior to visit.    Review of Systems  Constitutional: Negative for unusual  diaphoresis or other sweats  HENT: Negative for ringing in ear Eyes: Negative for double vision or worsening visual disturbance.  Respiratory: Negative for choking and stridor.   Gastrointestinal: Negative for vomiting or other signifcant bowel change Genitourinary: Negative for hematuria or decreased urine volume.  Musculoskeletal: Negative for other MSK pain or swelling Skin: Negative for color change and worsening wound.  Neurological: Negative for tremors and numbness other than noted  Psychiatric/Behavioral: Negative for decreased concentration or agitation other than above       Objective:   Physical Exam BP 132/88  Pulse 84  Temp(Src) 98.2 F (36.8 C) (Oral)  Ht 5\' 9"  (1.753 m)  Wt 274 lb (124.286 kg)  BMI 40.44 kg/m2 VS noted,  Constitutional: Pt appears  well-developed, well-nourished.  HENT: Head: NCAT.  Right Ear: External ear normal.  Left Ear: External ear normal.  Eyes: . Pupils are equal, round, and reactive to light. Conjunctivae and EOM are normal Neck: Normal range of motion. Neck supple.  Cardiovascular: Normal rate and regular rhythm.   Pulmonary/Chest: Effort normal and breath sounds normal.  Abd:  Soft, NT, ND, + BS Neurological: Pt is alert. Not confused , motor grossly intact Skin: Skin is warm. No rash Psychiatric: Pt behavior is normal. No agitation.     Assessment & Plan:

## 2013-06-04 NOTE — Assessment & Plan Note (Signed)
Discussed with patient at great length. Patient will try conservative therapy and patient was given steroid injections today. Patient has failed all other conservative therapy such as physical therapy previously. Patient given home exercise program, 2 braces and referred by me, icing regimen we discussed over-the-counter medications that could be beneficial in safe with his other medications and comorbidities. Patient will try these interventions and come back again in 3 weeks. Patient is not doing any better then we can consider restarting another Synvisc series.

## 2013-06-04 NOTE — Patient Instructions (Addendum)
OK to increase the amytriptilene to 50 mg per night at bedtime  If this does not help, 100 mg might be an option, or change to Cymbalta   You will be contacted regarding the referral for: leg circulation test (please call here in 1 wk if you are not contacted about this)  Please re-start use of the left wrist splint  You will be seeing Dr Tamala Julian now after this appt ; so go to front desk to check in for that appt  Please continue all other medications as before, and refills have been done if requested. Please have the pharmacy call with any other refills you may need.  Please keep your appointments with your specialists as you have planned - dialysis, cardiology and podiatry  Please return in 6 months, or sooner if needed, with Lab testing done 3-5 days before

## 2013-06-05 ENCOUNTER — Ambulatory Visit (INDEPENDENT_AMBULATORY_CARE_PROVIDER_SITE_OTHER): Payer: Medicare Other | Admitting: Emergency Medicine

## 2013-06-05 ENCOUNTER — Encounter: Payer: Self-pay | Admitting: Internal Medicine

## 2013-06-05 ENCOUNTER — Encounter (INDEPENDENT_AMBULATORY_CARE_PROVIDER_SITE_OTHER): Payer: Self-pay

## 2013-06-05 ENCOUNTER — Encounter: Payer: Self-pay | Admitting: Emergency Medicine

## 2013-06-05 VITALS — BP 130/78 | HR 108 | Ht 69.0 in | Wt 268.2 lb

## 2013-06-05 DIAGNOSIS — I6350 Cerebral infarction due to unspecified occlusion or stenosis of unspecified cerebral artery: Secondary | ICD-10-CM | POA: Diagnosis not present

## 2013-06-05 DIAGNOSIS — G4733 Obstructive sleep apnea (adult) (pediatric): Secondary | ICD-10-CM

## 2013-06-05 DIAGNOSIS — I272 Pulmonary hypertension, unspecified: Secondary | ICD-10-CM

## 2013-06-05 DIAGNOSIS — I2789 Other specified pulmonary heart diseases: Secondary | ICD-10-CM

## 2013-06-05 NOTE — Progress Notes (Signed)
   Subjective:    Patient ID: Daniel Whitney, male    DOB: 1955-04-15, 58 y.o.   MRN: [id number]  HPI 58 yo man, never smoker, HTN, A Fib (on amio and coumadin), reported CHF, obesity with OSA (wore CPAP x 2 yrs, lost wt and had a repeat PSG that showed no longer needed CPAP, stopped it in 2005), DM, ESRD on HD ([facility]). Has had TTE's in [place], Dr Edgardo Roys, most recently from Feb 2014.   He has minimal edema, tolerates HD with some hypotension. He denies breathing difficulty. He denies nocturnal awakenings. Some snoring and talking in sleep. No naps. Well rested in the am.   ROV 06/05/13 -- follows for secondary PAH, HTN, A Fib, obesity and OSA (no longer requires CPAP), DM, ESRD. He has started to notice napping, unintentional. Has been more tired even after he wakes up. He is doing sinus washes. He has PFT from [place] that we haven't received yet.   Review of Systems  Constitutional: Negative for fever and unexpected weight change.  HENT: Positive for postnasal drip. Negative for congestion, dental problem, ear pain, nosebleeds, rhinorrhea, sinus pressure, sneezing, sore throat and trouble swallowing.   Eyes: Negative for redness and itching.  Respiratory: Positive for shortness of breath and wheezing. Negative for cough and chest tightness.   Cardiovascular: Negative for palpitations and leg swelling.  Gastrointestinal: Negative for nausea and vomiting.  Genitourinary: Negative for dysuria.  Musculoskeletal: Negative for joint swelling.  Skin: Negative for rash.  Neurological: Negative for headaches.  Hematological: Bruises/bleeds easily.  Psychiatric/Behavioral: Negative for dysphoric mood. The patient is not nervous/anxious.        Objective:   Physical Exam Filed Vitals:   06/05/13 1455  BP: 130/78  Pulse: 108  Height: 5\' 9"  (1.753 m)  Weight: 268 lb 3.2 oz (121.655 kg)  SpO2: 92%  Gen: Pleasant, well-nourished, in no distress,  normal affect  ENT: No lesions,  mouth clear,   oropharynx clear, no postnasal drip  Neck: No JVD, no TMG, no carotid bruits  Lungs: No use of accessory muscles, no dullness to percussion, clear without rales or rhonchi  Cardiovascular: RRR, heart sounds normal, no murmur or gallops, trace peripheral edema  Musculoskeletal: No deformities, no cyanosis or clubbing  Neuro: alert, non focal  Skin: Warm, no lesions or rashes   04/16/13 -- TTE Study Conclusions - Left ventricle: The cavity size was normal. Wall thickness was increased in a pattern of mild LVH. Systolic function was normal. The estimated ejection fraction was in the range of 55% to 60%. Wall motion was normal; there were no regional wall motion abnormalities. - Right ventricle: The cavity size was mildly dilated. Systolic function was mildly reduced. - Right atrium: The atrium was moderately dilated. - Pulmonary arteries: Systolic pressure was moderately increased. PA peak pressure: 48mm Hg (S).      Assessment & Plan:  OSA (obstructive sleep apnea) Suspect that he is having clinically relevant OSA again - he agrees that he has become symptomatic. Will assess and treat - split-night PSG ordered  Pulmonary hypertension PASP ~ 78mmHg. HTN and A fib controlled, suspect that he is being affected by OSA.  - PSG - get old records and perform PFT if he has not had any in the last year

## 2013-06-05 NOTE — Patient Instructions (Signed)
We will perform a split-night sleep study We will obtain your records from [place] Follow with Dr Lamonte Sakai in 2 months or sooner if you have any problems.

## 2013-06-05 NOTE — Assessment & Plan Note (Signed)
Suspect that he is having clinically relevant OSA again - he agrees that he has become symptomatic. Will assess and treat - split-night PSG ordered

## 2013-06-05 NOTE — Assessment & Plan Note (Signed)
PASP ~ 54mmHg. HTN and A fib controlled, suspect that he is being affected by OSA.  - PSG - get old records and perform PFT if he has not had any in the last year

## 2013-06-06 DIAGNOSIS — M79604 Pain in right leg: Secondary | ICD-10-CM | POA: Insufficient documentation

## 2013-06-06 DIAGNOSIS — M79605 Pain in left leg: Principal | ICD-10-CM | POA: Insufficient documentation

## 2013-06-06 DIAGNOSIS — G5600 Carpal tunnel syndrome, unspecified upper limb: Secondary | ICD-10-CM | POA: Insufficient documentation

## 2013-06-06 NOTE — Assessment & Plan Note (Addendum)
Left hand, for re-start left wrist splint qhs

## 2013-06-06 NOTE — Assessment & Plan Note (Signed)
To increased elavil to 50qd, consider 100qd, or change to cymbalta or other, o/w stable overall by history and exam, recent data reviewed with pt, and pt to continue medical treatment as before,  to f/u any worsening symptoms or concerns Lab Results  Component Value Date   HGBA1C 7.3* 01/01/2013

## 2013-06-06 NOTE — Assessment & Plan Note (Signed)
Likely multifactorial, including DPN, ? Claudication , and knee pain; for referral sport med/dr Tamala Julian today to address knees, also for LE art dopplers  Note:  Total time for pt hx, exam, review of record with pt in the room, determination of diagnoses and plan for further eval and tx is > 40 min, with over 50% spent in coordination and counseling of patient

## 2013-06-06 NOTE — Assessment & Plan Note (Signed)
stable overall by history and exam, recent data reviewed with pt, and pt to continue medical treatment as before,  to f/u any worsening symptoms or concerns BP Readings from Last 3 Encounters:  06/05/13 130/78  06/04/13 132/88  06/04/13 132/88

## 2013-06-06 NOTE — Assessment & Plan Note (Signed)
Apparently more symptomatic recent, exam ow benign, for pulm f/u as planned

## 2013-06-08 DIAGNOSIS — I4891 Unspecified atrial fibrillation: Secondary | ICD-10-CM | POA: Diagnosis not present

## 2013-06-09 DIAGNOSIS — E1149 Type 2 diabetes mellitus with other diabetic neurological complication: Secondary | ICD-10-CM | POA: Diagnosis not present

## 2013-06-09 DIAGNOSIS — L608 Other nail disorders: Secondary | ICD-10-CM | POA: Diagnosis not present

## 2013-06-09 DIAGNOSIS — L84 Corns and callosities: Secondary | ICD-10-CM | POA: Diagnosis not present

## 2013-06-11 ENCOUNTER — Telehealth: Payer: Self-pay | Admitting: Emergency Medicine

## 2013-06-11 ENCOUNTER — Ambulatory Visit (HOSPITAL_BASED_OUTPATIENT_CLINIC_OR_DEPARTMENT_OTHER): Payer: Medicare Other | Attending: Emergency Medicine | Admitting: Radiology

## 2013-06-11 VITALS — Ht 69.0 in | Wt 270.0 lb

## 2013-06-11 DIAGNOSIS — G4733 Obstructive sleep apnea (adult) (pediatric): Secondary | ICD-10-CM | POA: Insufficient documentation

## 2013-06-11 DIAGNOSIS — I10 Essential (primary) hypertension: Secondary | ICD-10-CM | POA: Insufficient documentation

## 2013-06-11 DIAGNOSIS — N186 End stage renal disease: Secondary | ICD-10-CM | POA: Diagnosis not present

## 2013-06-11 DIAGNOSIS — I2789 Other specified pulmonary heart diseases: Secondary | ICD-10-CM | POA: Diagnosis not present

## 2013-06-11 DIAGNOSIS — I4891 Unspecified atrial fibrillation: Secondary | ICD-10-CM | POA: Insufficient documentation

## 2013-06-11 NOTE — Telephone Encounter (Signed)
Called and spoke with [facility], medical records department in [place], [place]. They stated there policy is to mail record of release form and with 10 -15 business would mail requested records back. A copy of release has been made and original release for records mailed. Awaiting records

## 2013-06-12 DIAGNOSIS — N186 End stage renal disease: Secondary | ICD-10-CM | POA: Diagnosis not present

## 2013-06-15 DIAGNOSIS — I4891 Unspecified atrial fibrillation: Secondary | ICD-10-CM | POA: Diagnosis not present

## 2013-06-15 LAB — PROTIME-INR: INR: 1.7 — AB (ref <=1.1)

## 2013-06-16 ENCOUNTER — Ambulatory Visit: Payer: Medicare Other | Admitting: Family Medicine

## 2013-06-16 DIAGNOSIS — G4733 Obstructive sleep apnea (adult) (pediatric): Secondary | ICD-10-CM

## 2013-06-16 NOTE — Sleep Study (Signed)
[place]  NAME: Daniel Whitney DATE OF BIRTH:  09/28/1955 MEDICAL RECORD NUMBER [id number]  LOCATION: [facility]  PHYSICIAN: Chesley Mires, M.D. DATE OF STUDY: 06/11/2013  SLEEP STUDY TYPE: Split night protocol               REFERRING PHYSICIAN: Collene Gobble, MD  INDICATION FOR STUDY:  Daniel Whitney is a 58 y.o. male who presents to the sleep lab for evaluation of hypersomnia with obstructive sleep apnea.  He reports snoring, sleep disruption, apnea, and daytime sleepiness.  He has prior history of sleep apnea, and also has history of pulmonary hypertension, systemic hypertension, and atrial fibrillation.  EPWORTH SLEEPINESS SCORE: 22. HEIGHT: 5\' 9"  (175.3 cm)  WEIGHT: 270 lb (122.471 kg)    Body mass index is 39.85 kg/(m^2).  NECK SIZE: 19.5 in.  MEDICATIONS:  Current Outpatient Prescriptions on File Prior to Visit  Medication Sig Dispense Refill  . allopurinol (ZYLOPRIM) 100 MG tablet Take 100 mg by mouth 2 (two) times daily.      Marland Kitchen amiodarone (PACERONE) 200 MG tablet Take 200 mg by mouth daily.      Marland Kitchen amitriptyline (ELAVIL) 50 MG tablet Take 1 tablet (50 mg total) by mouth at bedtime.  90 tablet  1  . b complex-vitamin c-folic acid (NEPHRO-VITE) 0.8 MG TABS tablet Take 1 tablet by mouth daily.      . carvedilol (COREG) 25 MG tablet Take 25 mg by mouth 2 (two) times daily with a meal.      . clindamycin (CLEOCIN T) 1 % lotion Apply topically 2 (two) times daily.      . Eflornithine HCl (VANIQA) 13.9 % cream Apply topically 2 (two) times daily with a meal.      . OVER THE COUNTER MEDICATION 2 (two) times daily. Benzaderm Wash      . OVER THE COUNTER MEDICATION 1 application 2 (two) times daily. Melaquin Complex      . sevelamer carbonate (RENVELA) 800 MG tablet Take 850 mg by mouth 3 (three) times daily as needed.       . simvastatin (ZOCOR) 20 MG tablet Take 20 mg by mouth daily.      . sitaGLIPtin (JANUVIA) 25 MG tablet Take 1 tablet (25  mg total) by mouth daily.  90 tablet  3  . traMADol (ULTRAM) 50 MG tablet Take 1 tablet (50 mg total) by mouth every 8 (eight) hours as needed.  270 tablet  1  . valsartan (DIOVAN) 40 MG tablet Take 40 mg by mouth 4 (four) times a week.       . warfarin (COUMADIN) 3 MG tablet Take as directed by anticoagulation clinic  105 tablet  1   No current facility-administered medications on file prior to visit.    SLEEP ARCHITECTURE:  Diagnostic portion: Total recording time: 131 minutes.  Total sleep time was: 120 minutes.  Sleep efficiency: 91.6%.  Sleep latency: 1 minutes.  REM latency: 101 minutes.  Stage N1: 9.2%.  Stage N2: 84.2%.  Stage N3: 0%.  Stage R:  6.7%.  Supine sleep: 0 minutes.  Non-supine sleep: 120 minutes.  Titration portion: Total recording time: 263.5 minutes.  Total sleep time was: 223.5 minutes.  Sleep efficiency: 84.8%.  Sleep latency: 1 minutes.  REM latency: 1 minutes.  Stage N1: 9.2%.  Stage N2: 63.5%.  Stage N3: 0%.  Stage R:  27.3%.  Supine sleep: 118.5 minutes.  Non-supine sleep: 105 minutes.  CARDIAC DATA:  Average heart rate: 83 beats per minute. Rhythm strip: sinus rhythm with episodes of atrial fibrillation, PVCs and PACs.  RESPIRATORY DATA: Diagnostic portion: Average respiratory rate: 26. Snoring: Loud. Average AHI: 76.5.   Apnea index: 43.5.  Hypopnea index: 33. Obstructive apnea index: 43.5.  Central apnea index: 0.  Mixed apnea index: 0. REM AHI: 60.  NREM AHI: 77.7. Supine AHI: N/A. Non-supine AHI: 76.5.  Titration portion: He was started on CPAP 5 and increased to CPAP 20 cm H2O.  With CPAP at 14 cm H2O he had good control of his apnea in NREM sleep.  However, in REM sleep he continued to have respiratory events.  He was transitioned to BiPAP 20/18 and increased to 23/19 cm H2O.  He continued to have respiratory events.  MOVEMENT/PARASOMNIA:  Periodic limb movement: 0.  Period limb movements with arousals: 0. Restroom trips: one.  OXYGEN  DATA:  Baseline oxygenation: 88%. Lowest SaO2: 47%. Time spent below SaO2 90%: 232 minutes. Supplemental oxygen used: 3 liters.  He was noted to have oxygen desaturation in the absence of other respiratory events.  This was most pronounced in REM sleep.  IMPRESSION/ RECOMMENDATION:   This study shows severe obstructive sleep apnea with an AHI of 76.5 and SaO2 low of 47%.  He had significant oxygen desaturation in the absence of other respiratory events.  This was most pronounced during REM sleep.  This is suggestive of sleep related hypoventilation.  He had a sub-optimal titration portion of the study.  While he had adequate control of his apnea with CPAP 14 cm H2O during NREM sleep, he developed significant respiratory events with oxygen desaturations during REM sleep.  These were not adequately controlled in spite of using relatively high BiPAP settings and 3 liters supplemental oxygen.  In addition to weight loss, I would recommend the patient return to the sleep lab for a full night titration study starting on BiPAP 12/8 cm H2O and increase as needed.  Could then add supplemental oxygen as needed.  If this is unsuccessful, then he might need to be titrated using adaptive servo-ventilator mode of therapy.  Chesley Mires, M.D. Diplomate, [employer] adviser of [employer] Medicine  ELECTRONICALLY SIGNED ON:  06/16/2013, 7:34 AM [place] PH: [phone]   FX: [phone] [place]

## 2013-06-17 ENCOUNTER — Telehealth: Payer: Self-pay | Admitting: Emergency Medicine

## 2013-06-17 ENCOUNTER — Ambulatory Visit (INDEPENDENT_AMBULATORY_CARE_PROVIDER_SITE_OTHER): Payer: Medicare Other | Admitting: General Practice

## 2013-06-17 DIAGNOSIS — I2789 Other specified pulmonary heart diseases: Secondary | ICD-10-CM

## 2013-06-17 DIAGNOSIS — I272 Pulmonary hypertension, unspecified: Secondary | ICD-10-CM

## 2013-06-17 DIAGNOSIS — I6350 Cerebral infarction due to unspecified occlusion or stenosis of unspecified cerebral artery: Secondary | ICD-10-CM

## 2013-06-17 DIAGNOSIS — I639 Cerebral infarction, unspecified: Secondary | ICD-10-CM

## 2013-06-17 DIAGNOSIS — Z5181 Encounter for therapeutic drug level monitoring: Secondary | ICD-10-CM

## 2013-06-17 DIAGNOSIS — G4733 Obstructive sleep apnea (adult) (pediatric): Secondary | ICD-10-CM

## 2013-06-17 DIAGNOSIS — I4891 Unspecified atrial fibrillation: Secondary | ICD-10-CM

## 2013-06-17 NOTE — Progress Notes (Signed)
Pre visit review using our clinic review tool, if applicable. No additional management support is needed unless otherwise documented below in the visit note. 

## 2013-06-17 NOTE — Telephone Encounter (Signed)
[facility] PT line busy x 4 wcb  

## 2013-06-18 ENCOUNTER — Ambulatory Visit (INDEPENDENT_AMBULATORY_CARE_PROVIDER_SITE_OTHER): Payer: Medicare Other | Admitting: Internal Medicine

## 2013-06-18 ENCOUNTER — Encounter: Payer: Self-pay | Admitting: Internal Medicine

## 2013-06-18 ENCOUNTER — Other Ambulatory Visit (INDEPENDENT_AMBULATORY_CARE_PROVIDER_SITE_OTHER): Payer: Medicare Other

## 2013-06-18 ENCOUNTER — Telehealth: Payer: Self-pay | Admitting: Internal Medicine

## 2013-06-18 ENCOUNTER — Ambulatory Visit (INDEPENDENT_AMBULATORY_CARE_PROVIDER_SITE_OTHER)
Admission: RE | Admit: 2013-06-18 | Discharge: 2013-06-18 | Disposition: A | Discharge status: Home / Self Care | Payer: Medicare Other | Source: Ambulatory Visit | Attending: Internal Medicine | Admitting: Internal Medicine

## 2013-06-18 VITALS — BP 120/82 | HR 87 | Temp 98.5°F | Ht 69.0 in | Wt 273.0 lb

## 2013-06-18 DIAGNOSIS — R0609 Other forms of dyspnea: Secondary | ICD-10-CM | POA: Diagnosis not present

## 2013-06-18 DIAGNOSIS — I509 Heart failure, unspecified: Secondary | ICD-10-CM

## 2013-06-18 DIAGNOSIS — R0600 Dyspnea, unspecified: Secondary | ICD-10-CM

## 2013-06-18 DIAGNOSIS — I6350 Cerebral infarction due to unspecified occlusion or stenosis of unspecified cerebral artery: Secondary | ICD-10-CM

## 2013-06-18 DIAGNOSIS — R0602 Shortness of breath: Secondary | ICD-10-CM | POA: Diagnosis not present

## 2013-06-18 DIAGNOSIS — I4891 Unspecified atrial fibrillation: Secondary | ICD-10-CM | POA: Diagnosis not present

## 2013-06-18 DIAGNOSIS — R062 Wheezing: Secondary | ICD-10-CM | POA: Diagnosis not present

## 2013-06-18 DIAGNOSIS — J309 Allergic rhinitis, unspecified: Secondary | ICD-10-CM | POA: Diagnosis not present

## 2013-06-18 DIAGNOSIS — R0989 Other specified symptoms and signs involving the circulatory and respiratory systems: Secondary | ICD-10-CM

## 2013-06-18 DIAGNOSIS — R7989 Other specified abnormal findings of blood chemistry: Secondary | ICD-10-CM

## 2013-06-18 LAB — CBC WITH DIFFERENTIAL/PLATELET
BASOS ABS: 0.1 10*3/uL (ref 0.0–0.1)
Basophils Relative: 1 % (ref 0.0–3.0)
EOS ABS: 0.3 10*3/uL (ref 0.0–0.7)
Eosinophils Relative: 3.9 % (ref 0.0–5.0)
HEMATOCRIT: 34 % — AB (ref 39.0–52.0)
Hemoglobin: 11.3 g/dL — ABNORMAL LOW (ref 13.0–17.0)
Lymphocytes Relative: 24.4 % (ref 12.0–46.0)
Lymphs Abs: 1.8 10*3/uL (ref 0.7–4.0)
MCHC: 33.2 g/dL (ref 30.0–36.0)
MCV: 95.6 fl (ref 78.0–100.0)
MONOS PCT: 9.8 % (ref 3.0–12.0)
Monocytes Absolute: 0.7 10*3/uL (ref 0.1–1.0)
NEUTROS ABS: 4.4 10*3/uL (ref 1.4–7.7)
Neutrophils Relative %: 60.9 % (ref 43.0–77.0)
PLATELETS: 202 10*3/uL (ref 150.0–400.0)
RBC: 3.55 Mil/uL — ABNORMAL LOW (ref 4.22–5.81)
RDW: 16.5 % — AB (ref 11.5–15.5)
WBC: 7.2 10*3/uL (ref 4.0–10.5)

## 2013-06-18 LAB — HEPATIC FUNCTION PANEL
ALBUMIN: 3.2 g/dL — AB (ref 3.5–5.2)
ALK PHOS: 121 U/L — AB (ref 39–117)
ALT: 19 U/L (ref 0–53)
AST: 11 U/L (ref 0–37)
BILIRUBIN DIRECT: 0.1 mg/dL (ref 0.0–0.3)
Total Bilirubin: 0.5 mg/dL (ref 0.2–1.2)
Total Protein: 6.8 g/dL (ref 6.0–8.3)

## 2013-06-18 LAB — BASIC METABOLIC PANEL
BUN: 37 mg/dL — ABNORMAL HIGH (ref 6–23)
CHLORIDE: 99 meq/L (ref 96–112)
CO2: 29 meq/L (ref 19–32)
Calcium: 9 mg/dL (ref 8.4–10.5)
Creatinine, Ser: 4.2 mg/dL — ABNORMAL HIGH (ref 0.4–1.5)
GFR: 15.44 mL/min — ABNORMAL LOW (ref >60.00)
Glucose, Bld: 139 mg/dL — ABNORMAL HIGH (ref 70–99)
POTASSIUM: 4.1 meq/L (ref 3.5–5.1)
SODIUM: 135 meq/L (ref 135–145)

## 2013-06-18 LAB — TROPONIN I: TROPONIN I: 0.04 ng/mL (ref <0.06)

## 2013-06-18 LAB — D-DIMER, QUANTITATIVE: D-Dimer, Quant: 2.19 ug/mL-FEU — ABNORMAL HIGH (ref 0.00–0.48)

## 2013-06-18 MED ORDER — TRAMADOL HCL 50 MG PO TABS: 50.0000 mg | ORAL_TABLET | Freq: Three times a day (TID) | ORAL | Status: DC | PRN

## 2013-06-18 MED ORDER — METHYLPREDNISOLONE ACETATE 80 MG/ML IJ SUSP
80.0000 mg | Freq: Once | INTRAMUSCULAR | Status: AC
  Administered 2013-06-18: 80 mg via INTRAMUSCULAR

## 2013-06-18 MED ORDER — PREDNISONE 10 MG PO TABS: ORAL_TABLET | ORAL | Status: DC

## 2013-06-18 NOTE — Telephone Encounter (Signed)
Called the numbers listed in chart for his brothers, left a message for both to have the patient call his PCP.

## 2013-06-18 NOTE — Telephone Encounter (Signed)
[id number] x1 

## 2013-06-18 NOTE — Assessment & Plan Note (Addendum)
ECG reviewed as per emr - afib with adequate rate (non RVR), volume stable, but abnormal PE, suspect element of bronchospasm related to allergy season; for depomedrol IM, predpak asd, but also for cxr , trop I, cbc, D-Dimer; also for sample qvar 80 at 1 puff bid until gone; consider antibx given rather heavy diaphoresis today - to f/u cxr  Note:  Total time for pt hx, exam, review of record with pt in the room, determination of diagnoses and plan for further eval and tx is > 40 min, with over 50% spent in coordination and counseling of patient

## 2013-06-18 NOTE — Addendum Note (Signed)
Addended by: Rosana Berger on: 06/18/2013 02:40 PM   Modules accepted: Orders

## 2013-06-18 NOTE — Progress Notes (Signed)
Subjective:    Patient ID: Daniel Whitney, male    DOB: 04-24-1955, 58 y.o.   MRN: [id number]  HPI  Here to f/u with 5 days intermittent sob/doe particularly more persistent/constant in last 2 days. Such that is able to walk about the home slowly as far as he wants, but feels unsully sob/doe with even speaking longer sentences, assoc with some trace wheezing as well, and occas nonprod cough.  Has unsual fatigue and lack of energy as well.  Still getting HD m-w-f, no recent volume overload and has tolerated well.  Gets INR checked regularly, last between 2-3, but not sure any recent cbc/hgb.  No overt bleeding or bruising.  No fever or chills, but has occas recurring sweats, worse last night and today.  Pt denies chest "pain",  orthopnea, PND, increased LE swelling, palpitations, dizziness or syncope, but does mention a kind of diffuse chest pressure.  No hx of DVT or MI.  Compliant with qhs CPAP.   Past Medical History  Diagnosis Date  . Arthritis   . Diabetes mellitus with neuropathy   . Glaucoma   . Hypertension   . ESRD (end stage renal disease) on dialysis   . Stroke   . Hyperlipidemia   . Atrial fibrillation 12/30/2012  . CHF (congestive heart failure) 12/30/2012  . Diabetic retinopathy 12/30/2012  . Morbid obesity 12/30/2012  . OSA (obstructive sleep apnea) 12/30/2012  . Pulmonary hypertension 12/30/2012   Past Surgical History  Procedure Laterality Date  . Total hip arthroplasty Right 1980  . Av fistula placement Left     reports that he has never smoked. He has never used smokeless tobacco. He reports that he drinks about 1.5 - 2 ounces of alcohol per week. He reports that he does not use illicit drugs. family history includes Arthritis in his other; Diabetes in his other; Heart disease in his other; Hyperlipidemia in his other; Hypertension in his other; Kidney disease in his other; Stroke in his other. No Known Allergies Current Outpatient Prescriptions on File Prior to Visit    Medication Sig Dispense Refill  . allopurinol (ZYLOPRIM) 100 MG tablet Take 100 mg by mouth 2 (two) times daily.      Marland Kitchen amiodarone (PACERONE) 200 MG tablet Take 200 mg by mouth daily.      Marland Kitchen amitriptyline (ELAVIL) 50 MG tablet Take 1 tablet (50 mg total) by mouth at bedtime.  90 tablet  1  . b complex-vitamin c-folic acid (NEPHRO-VITE) 0.8 MG TABS tablet Take 1 tablet by mouth daily.      . carvedilol (COREG) 25 MG tablet Take 25 mg by mouth 2 (two) times daily with a meal.      . clindamycin (CLEOCIN T) 1 % lotion Apply topically 2 (two) times daily.      . Eflornithine HCl (VANIQA) 13.9 % cream Apply topically 2 (two) times daily with a meal.      . OVER THE COUNTER MEDICATION 2 (two) times daily. Benzaderm Wash      . OVER THE COUNTER MEDICATION 1 application 2 (two) times daily. Melaquin Complex      . sevelamer carbonate (RENVELA) 800 MG tablet Take 850 mg by mouth 3 (three) times daily as needed.       . simvastatin (ZOCOR) 20 MG tablet Take 20 mg by mouth daily.      . sitaGLIPtin (JANUVIA) 25 MG tablet Take 1 tablet (25 mg total) by mouth daily.  90 tablet  3  . valsartan (  DIOVAN) 40 MG tablet Take 40 mg by mouth 4 (four) times a week.       . warfarin (COUMADIN) 3 MG tablet Take as directed by anticoagulation clinic  105 tablet  1   No current facility-administered medications on file prior to visit.    Review of Systems  Constitutional: Negative for unusual diaphoresis or other sweats  HENT: Negative for ringing in ear Eyes: Negative for double vision or worsening visual disturbance.  Respiratory: Negative for choking and stridor.   Gastrointestinal: Negative for vomiting or other signifcant bowel change Genitourinary: Negative for hematuria or decreased urine volume.  Musculoskeletal: Negative for other MSK pain or swelling Skin: Negative for color change and worsening wound.  Neurological: Negative for tremors and numbness other than noted  Psychiatric/Behavioral: Negative  for decreased concentration or agitation other than above       Objective:   Physical Exam BP 120/82  Pulse 87  Temp(Src) 98.5 F (36.9 C) (Oral)  Ht 5\' 9"  (1.753 m)  Wt 273 lb (123.832 kg)  BMI 40.30 kg/m2  SpO2 91% VS noted, rather heavy sweating during exam Constitutional: Pt appears well-developed, well-nourished./morbid obese though trying to lose wt recently  HENT: Head: NCAT.  Right Ear: External ear normal.  Left Ear: External ear normal.  Eyes: . Pupils are equal, round, and reactive to light. Conjunctivae and EOM are normal Neck: Normal range of motion. Neck supple.  Cardiovascular: Normal rate and irregular rhythm.   Bilat tm's with mild erythema.  Max sinus areas non tender.  Pharynx with mild erythema, no exudate Pulmonary/Chest: Effort normal and breath sounds  - decr BS left chest with few LLL crackles, trace wheeze bilat .  Abd:  Soft, NT, ND, + BS Neurological: Pt is alert. Not confused , motor grossly intact Skin: Skin is warm. No rash, No LE edema Psychiatric: Pt behavior is normal. No agitation.  + thrill    Assessment & Plan:

## 2013-06-18 NOTE — Telephone Encounter (Signed)
I've reviewed Dr Juanetta Gosling interpretation > pt has OSA and will need BiPAP. He recommends that pt should go back to the sleep lab for a BiPAP titration study starting at 12/8. PLease set this up and let the patient know. Thanks.

## 2013-06-18 NOTE — Telephone Encounter (Signed)
Spoke with the pt and notified of recs per RB Pt verbalized understanding  Nothing further needed

## 2013-06-18 NOTE — Patient Instructions (Signed)
You had the steroid shot today  Please take all new medication as prescribed - the prednisone  Please continue all other medications as before, and refills have been done if requested. Please have the pharmacy call with any other refills you may need.  Please continue your efforts at being more active, low cholesterol diet, and weight control.  Please go to the XRAY Department in the Basement (go straight as you get off the elevator) for the x-ray testing  Please go to the LAB in the Basement (turn left off the elevator) for the tests to be done today  You will be contacted by phone if any changes need to be made immediately.  Otherwise, you will receive a letter about your results with an explanation, but please check with MyChart first.  Please remember to sign up for MyChart if you have not done so, as this will be important to you in the future with finding out test results, communicating by private email, and scheduling acute appointments online when needed.  You will be contacted regarding the referral for: cardiology  Please return in 3 months, or sooner if needed

## 2013-06-18 NOTE — Assessment & Plan Note (Addendum)
Should improve with steroid/predpac tx,  to f/u any worsening symptoms or concerns, also for card referral, has not been seen recently

## 2013-06-18 NOTE — Progress Notes (Signed)
Pre visit review using our clinic review tool, if applicable. No additional management support is needed unless otherwise documented below in the visit note. 

## 2013-06-18 NOTE — Telephone Encounter (Signed)
Called the patient left message to call back 

## 2013-06-18 NOTE — Telephone Encounter (Signed)
Pt returned call.  Spoke with patient who reported that he is getting anxious to know the results to his sleep study done on 4.30.15 and would like to move forward with ordering a CPAP machine.  Apologized to pt for the confusion and informed him that it typically takes 14 days to receive the results to such a test, then of course additional time for the physician to read the test, inform pt of the recommendations and the order/set up to be done by the DME.    Pt again stated that he is very anxious to get this taken care of; he sleeps "every 3 days" and when he does sleep "he crashes."  Advised pt that we understand this is important to him and will forward to [place] to make him aware.  Pt verbalized his understanding and is aware that we will contact him as soon as his results are available.  Will sign and forward to RB as FYI.

## 2013-06-18 NOTE — Telephone Encounter (Signed)
Ok to let pt know - his CXR was OK (no obvious pna or chf) But his d-dimer level was elevated mildly, so we cant r/o possible PE  As he has ESRD (kidney failure) on dialysis), we need to avoid contrast study like CTA chest;   So for V/Q scan if can be arranged - will try to order, needs to be done asap, today if possible  Pt should go to ER if symptoms get worse overnight  Robin to send addon for INR if possible - v58.61

## 2013-06-18 NOTE — Telephone Encounter (Signed)
lmomtcb x1 for pt 

## 2013-06-18 NOTE — Assessment & Plan Note (Signed)
Also for cxr but hoping by exam current symtpoms not volume related

## 2013-06-18 NOTE — Assessment & Plan Note (Signed)
stable overall by history and exam, recent data reviewed with pt, and pt to continue medical treatment as before,  to f/u any worsening symptoms or concerns  

## 2013-06-18 NOTE — Telephone Encounter (Signed)
Message copied by Biagio Borg on Thu Jun 18, 2013  3:31 PM ------      Message from: Sharon Seller B      Created: Thu Jun 18, 2013  2:57 PM       Critical D Dimer 2.19 ------

## 2013-06-18 NOTE — Telephone Encounter (Signed)
Called the patient several times left multiple messages to call back.

## 2013-06-19 ENCOUNTER — Ambulatory Visit (HOSPITAL_COMMUNITY)
Admission: RE | Admit: 2013-06-19 | Discharge: 2013-06-19 | Disposition: A | Discharge status: Home / Self Care | Payer: Medicare Other | Source: Ambulatory Visit | Attending: Internal Medicine | Admitting: Internal Medicine

## 2013-06-19 ENCOUNTER — Encounter: Payer: Self-pay | Admitting: Internal Medicine

## 2013-06-19 DIAGNOSIS — R0609 Other forms of dyspnea: Secondary | ICD-10-CM | POA: Diagnosis not present

## 2013-06-19 DIAGNOSIS — R0602 Shortness of breath: Secondary | ICD-10-CM | POA: Diagnosis not present

## 2013-06-19 DIAGNOSIS — R0600 Dyspnea, unspecified: Secondary | ICD-10-CM

## 2013-06-19 DIAGNOSIS — R0989 Other specified symptoms and signs involving the circulatory and respiratory systems: Secondary | ICD-10-CM | POA: Diagnosis not present

## 2013-06-19 DIAGNOSIS — R7989 Other specified abnormal findings of blood chemistry: Secondary | ICD-10-CM

## 2013-06-19 MED ORDER — TECHNETIUM TC 99M DIETHYLENETRIAME-PENTAACETIC ACID: 40.0000 | Freq: Once | INTRAVENOUS | Status: AC | PRN

## 2013-06-19 MED ORDER — TECHNETIUM TO 99M ALBUMIN AGGREGATED
6.0000 | Freq: Once | INTRAVENOUS | Status: AC | PRN
  Administered 2013-06-19: 6 via INTRAVENOUS

## 2013-06-19 NOTE — Telephone Encounter (Signed)
Called left message to call back 

## 2013-06-19 NOTE — Telephone Encounter (Signed)
The patient returned my phone call this morning.  Informed of his appointment at [facility] this morning.  Informed to go to [facility] 1st floor radiology at 11:15 AM for test.  The patient understood and agreed to do so.  I gave him the address as well as phone number.

## 2013-06-22 DIAGNOSIS — I4891 Unspecified atrial fibrillation: Secondary | ICD-10-CM | POA: Diagnosis not present

## 2013-06-23 ENCOUNTER — Ambulatory Visit (HOSPITAL_COMMUNITY): Payer: Medicare Other

## 2013-06-25 ENCOUNTER — Ambulatory Visit (INDEPENDENT_AMBULATORY_CARE_PROVIDER_SITE_OTHER): Payer: BC Managed Care – PPO | Admitting: Family Medicine

## 2013-06-25 ENCOUNTER — Encounter (HOSPITAL_COMMUNITY): Payer: Medicare Other

## 2013-06-25 ENCOUNTER — Ambulatory Visit: Payer: Medicare Other | Admitting: Family Medicine

## 2013-06-25 DIAGNOSIS — I6350 Cerebral infarction due to unspecified occlusion or stenosis of unspecified cerebral artery: Secondary | ICD-10-CM

## 2013-06-25 DIAGNOSIS — I4891 Unspecified atrial fibrillation: Secondary | ICD-10-CM

## 2013-06-25 DIAGNOSIS — I272 Pulmonary hypertension, unspecified: Secondary | ICD-10-CM

## 2013-06-25 DIAGNOSIS — I639 Cerebral infarction, unspecified: Secondary | ICD-10-CM

## 2013-06-25 DIAGNOSIS — I2789 Other specified pulmonary heart diseases: Secondary | ICD-10-CM

## 2013-06-25 DIAGNOSIS — Z5181 Encounter for therapeutic drug level monitoring: Secondary | ICD-10-CM

## 2013-06-25 LAB — POCT INR: INR: 2.45

## 2013-06-26 ENCOUNTER — Ambulatory Visit (HOSPITAL_BASED_OUTPATIENT_CLINIC_OR_DEPARTMENT_OTHER): Payer: Medicare Other | Attending: Emergency Medicine

## 2013-06-26 ENCOUNTER — Other Ambulatory Visit (HOSPITAL_COMMUNITY): Payer: Self-pay | Admitting: *Deleted

## 2013-06-26 VITALS — Ht 69.0 in | Wt 273.0 lb

## 2013-06-26 DIAGNOSIS — I2789 Other specified pulmonary heart diseases: Secondary | ICD-10-CM | POA: Insufficient documentation

## 2013-06-26 DIAGNOSIS — Z9989 Dependence on other enabling machines and devices: Secondary | ICD-10-CM

## 2013-06-26 DIAGNOSIS — IMO0002 Reserved for concepts with insufficient information to code with codable children: Secondary | ICD-10-CM | POA: Insufficient documentation

## 2013-06-26 DIAGNOSIS — I10 Essential (primary) hypertension: Secondary | ICD-10-CM | POA: Diagnosis not present

## 2013-06-26 DIAGNOSIS — G4733 Obstructive sleep apnea (adult) (pediatric): Secondary | ICD-10-CM | POA: Diagnosis not present

## 2013-06-26 DIAGNOSIS — I4891 Unspecified atrial fibrillation: Secondary | ICD-10-CM | POA: Insufficient documentation

## 2013-06-26 DIAGNOSIS — I70219 Atherosclerosis of native arteries of extremities with intermittent claudication, unspecified extremity: Secondary | ICD-10-CM

## 2013-06-26 DIAGNOSIS — Z79899 Other long term (current) drug therapy: Secondary | ICD-10-CM | POA: Diagnosis not present

## 2013-06-29 DIAGNOSIS — G4733 Obstructive sleep apnea (adult) (pediatric): Secondary | ICD-10-CM | POA: Diagnosis not present

## 2013-06-29 NOTE — Sleep Study (Signed)
[place]  NAME: Daniel Whitney DATE OF BIRTH:  [birth year] MEDICAL RECORD NUMBER [id number]  LOCATION: [facility]  PHYSICIAN: Chesley Mires, M.D. DATE OF STUDY: 06/26/2013  SLEEP STUDY TYPE: BiPAP titration study               REFERRING PHYSICIAN: Collene Gobble, MD  INDICATION FOR STUDY:  Daniel Whitney is a 58 y.o. male with history of pulmonary hypertension, systemic hypertension and atrial fibrillation.  He was found to have severe OSA on sleep study from 06/11/13 with an AHI of 76.5 and SaO2 low of 47%.  He returns to the sleep lab for full night titration study.  EPWORTH SLEEPINESS SCORE: 22. HEIGHT: 5\' 9"  (175.3 cm)  WEIGHT: 273 lb (123.832 kg)    Body mass index is 40.3 kg/(m^2).  NECK SIZE: 19.5 in.  MEDICATIONS:  Current Outpatient Prescriptions on File Prior to Visit  Medication Sig Dispense Refill  . allopurinol (ZYLOPRIM) 100 MG tablet Take 100 mg by mouth 2 (two) times daily.      Marland Kitchen amiodarone (PACERONE) 200 MG tablet Take 200 mg by mouth daily.      Marland Kitchen amitriptyline (ELAVIL) 50 MG tablet Take 1 tablet (50 mg total) by mouth at bedtime.  90 tablet  1  . b complex-vitamin c-folic acid (NEPHRO-VITE) 0.8 MG TABS tablet Take 1 tablet by mouth daily.      . carvedilol (COREG) 25 MG tablet Take 25 mg by mouth 2 (two) times daily with a meal.      . clindamycin (CLEOCIN T) 1 % lotion Apply topically 2 (two) times daily.      . Eflornithine HCl (VANIQA) 13.9 % cream Apply topically 2 (two) times daily with a meal.      . OVER THE COUNTER MEDICATION 2 (two) times daily. Benzaderm Wash      . OVER THE COUNTER MEDICATION 1 application 2 (two) times daily. Melaquin Complex      . predniSONE (DELTASONE) 10 MG tablet 3 tabs by mouth per day for 3 days,2tabs per day for 3 days,1tab per day for 3 days  18 tablet  0  . sevelamer carbonate (RENVELA) 800 MG tablet Take 850 mg by mouth 3 (three) times daily as needed.       . simvastatin (ZOCOR) 20 MG  tablet Take 20 mg by mouth daily.      . sitaGLIPtin (JANUVIA) 25 MG tablet Take 1 tablet (25 mg total) by mouth daily.  90 tablet  3  . traMADol (ULTRAM) 50 MG tablet Take 1 tablet (50 mg total) by mouth every 8 (eight) hours as needed.  270 tablet  1  . valsartan (DIOVAN) 40 MG tablet Take 40 mg by mouth 4 (four) times a week.       . warfarin (COUMADIN) 3 MG tablet Take as directed by anticoagulation clinic  105 tablet  1   No current facility-administered medications on file prior to visit.    SLEEP ARCHITECTURE:  Total recording time: 370.5 minutes.  Total sleep time was: 360.5 minutes.  Sleep efficiency: 97.3%.  Sleep latency: 0 minutes.  REM latency: 1.5 minutes.  Stage N1: 3.3%.  Stage N2: 71.3%.  Stage N3: 0%.  Stage R:  25.4%.  Supine sleep: 245 minutes.  Non-supine sleep: 115.5 minutes.  CARDIAC DATA:  Average heart rate: 87 beats per minute. Rhythm strip: sinus rhythm.  RESPIRATORY DATA: Average respiratory rate: 23. Snoring: moderate.  He was started on BiPAP  12/8 cm H2O and increased to 20/14 cm H2O.  With BiPAP 20/14 cm H2O his AHI was reduced to 0, and he was observed in REM sleep.  MOVEMENT/PARASOMNIA:  Periodic limb movement: 0.  Period limb movements with arousals: 0. Restroom trips: none.  OXYGEN DATA:  Baseline oxygenation: 91%. Lowest SaO2: 56%. Time spent below SaO2 90%: 126.7 minutes. Supplemental oxygen used: 1 liter.  IMPRESSION/ RECOMMENDATION:   This was a successful titration study.  He did well with BiPAP 20/14 cm H2O.  He did require 1 liter supplemental oxygen while using BiPAP to control oxygen desaturations which were most pronounced in REM sleep.  He was fitted with a Fisher Paykel medium sized Simplus full face mask.   Chesley Mires, M.D. Diplomate, Tax adviser of Sleep Medicine  ELECTRONICALLY SIGNED ON:  06/29/2013, 12:39 PM [place] PH: [phone]   FX: [phone] [place]

## 2013-06-30 DIAGNOSIS — N186 End stage renal disease: Secondary | ICD-10-CM | POA: Diagnosis not present

## 2013-06-30 DIAGNOSIS — I871 Compression of vein: Secondary | ICD-10-CM | POA: Diagnosis not present

## 2013-06-30 DIAGNOSIS — T82898A Other specified complication of vascular prosthetic devices, implants and grafts, initial encounter: Secondary | ICD-10-CM | POA: Diagnosis not present

## 2013-07-01 ENCOUNTER — Encounter (HOSPITAL_COMMUNITY): Payer: Medicare Other

## 2013-07-01 ENCOUNTER — Ambulatory Visit: Payer: BC Managed Care – PPO | Admitting: Cardiology

## 2013-07-01 DIAGNOSIS — R0989 Other specified symptoms and signs involving the circulatory and respiratory systems: Secondary | ICD-10-CM

## 2013-07-02 ENCOUNTER — Encounter (HOSPITAL_COMMUNITY): Payer: Self-pay | Admitting: Internal Medicine

## 2013-07-02 ENCOUNTER — Ambulatory Visit (INDEPENDENT_AMBULATORY_CARE_PROVIDER_SITE_OTHER): Payer: Medicare Other | Admitting: Family Medicine

## 2013-07-02 ENCOUNTER — Encounter: Payer: Self-pay | Admitting: Family Medicine

## 2013-07-02 VITALS — BP 140/80 | HR 77 | Ht 69.0 in | Wt 277.0 lb

## 2013-07-02 DIAGNOSIS — I70219 Atherosclerosis of native arteries of extremities with intermittent claudication, unspecified extremity: Secondary | ICD-10-CM | POA: Diagnosis not present

## 2013-07-02 DIAGNOSIS — M1710 Unilateral primary osteoarthritis, unspecified knee: Secondary | ICD-10-CM | POA: Diagnosis not present

## 2013-07-02 NOTE — Progress Notes (Signed)
[facility] [address] [place], [place] [address] Phone: [phone] Subjective:     CC: Bilateral knee pain follow up  [id number] Daniel Whitney is a 58 y.o. male patient is coming back for his bilateral knee pain. 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 Patient did have an injection of steroids at last visit and was given braces. Patient states that this has been this conservatively for he is able to do activities of daily living but unfortunately continues to have pain. Patient is R. any attempted conservative therapy with physical therapy and does have instability of the knee. Patient is happy with the results of the knee but would like to consider the Synvisc injections due to the chronic pain. Patient is not a candidate at this time for knee replacement secondary to his weight.     Past medical history, social, surgical and family history all reviewed in electronic medical record.   Review of Systems: No headache, visual changes, nausea, vomiting, diarrhea, constipation, dizziness, abdominal pain, skin rash, fevers, chills, night sweats, weight loss, swollen lymph nodes, body aches, joint swelling, muscle aches, chest pain, shortness of breath, mood changes.   Objective Blood pressure 140/80, pulse 77, height 5\' 9"  (1.753 m), weight 277 lb (125.646 kg), SpO2 97.00%.  General: No apparent distress alert and oriented x3 mood and affect normal, dressed appropriately. Morbid obesity.  HEENT: Pupils equal, extraocular movements intact patient though does not respond well to light in the right eye Respiratory: Patient's speak in full sentences and does not appear short of breath  Cardiovascular: No lower extremity edema, non tender, no erythema  Skin: Warm dry intact with no signs of infection or rash on extremities or on axial skeleton.  Abdomen: Soft nontender  Neuro:  Cranial nerves II through XII are intact, neurovascularly intact in all extremities with 2+ DTRs and 2+ pulses.  Lymph: No lymphadenopathy of posterior or anterior cervical chain or axillae bilaterally.  Gait normal with good balance and coordination.  MSK:  Non tender with full range of motion and good stability and symmetric strength and tone of shoulders, elbows, wrist, hip, and ankles bilaterally.  Knee: Bilateral On inspection significant osteoarthritic changes. Tender to palpation over the medial joint line bilaterally ROM is restricted lacking the last 10 of extension and only 95 of flexion bilaterally Ligaments with solid consistent endpoints including ACL, PCL, LCL, MCL. Non painful patellar compression. Patellar glide without crepitus. Patellar and quadriceps tendons unremarkable. Hamstring and quadriceps strength is normal.   Procedure: Real-time Ultrasound Guided Injection of right knee Device: GE Logiq E  Ultrasound guided injection is preferred based studies that show increased duration, increased effect, greater accuracy, decreased procedural pain, increased response rate, and decreased cost with ultrasound guided versus blind injection.  Verbal informed consent obtained.  Time-out conducted.  Noted no overlying erythema, induration, or other signs of local infection.  Skin prepped in a sterile fashion.  Local anesthesia: Topical Ethyl chloride.  With sterile technique and under real time ultrasound guidance: 16 mg/2.5 mL of Synvisc (sodium hyaluronate) in a prefilled syringe was injected easily into the knee through a 22-gauge needle. Completed without difficulty  Pain immediately resolved suggesting accurate placement of the medication.  Advised to call if fevers/chills, erythema, induration, drainage, or persistent bleeding.  Images permanently stored and available for review in the ultrasound unit.  Impression: Technically successful ultrasound guided  injection.  Procedure: Real-time Ultrasound Guided Injection of left knee Device: GE Logiq E  Ultrasound guided injection is preferred based studies that show increased duration, increased effect, greater accuracy, decreased procedural pain, increased response rate, and decreased cost with ultrasound guided versus blind injection.  Verbal informed consent obtained.  Time-out conducted.  Noted no overlying erythema, induration, or other signs of local infection.  Skin prepped in a sterile fashion.  Local anesthesia: Topical Ethyl chloride.  With sterile technique and under real time ultrasound guidance: 16 mg/2.5 mL of Synvisc (sodium hyaluronate) in a prefilled syringe was injected easily into the knee through a 22-gauge needle. Completed without difficulty  Pain immediately resolved suggesting accurate placement of the medication.  Advised to call if fevers/chills, erythema, induration, drainage, or persistent bleeding.  Images permanently stored and available for review in the ultrasound unit.  Impression: Technically successful ultrasound guided injection. Body mass index is 40.89 kg/(m^2).    Impression and Recommendations:     This case required medical decision making of moderate complexity.

## 2013-07-02 NOTE — Patient Instructions (Signed)
I am glad we are making progress  Wear the braces and continue icing.  Come back in 1 week for #2

## 2013-07-02 NOTE — Assessment & Plan Note (Signed)
Patient was given a series of Synvisc injections injection today. Patient will come back in one week for his second series. Encourage patient to continue the icing regimen as well as the exercises. Patient will continue bracing which is seems to be helping as well. Patient will follow up again in one week.

## 2013-07-03 ENCOUNTER — Telehealth (HOSPITAL_COMMUNITY): Payer: Self-pay | Admitting: *Deleted

## 2013-07-08 DIAGNOSIS — I4891 Unspecified atrial fibrillation: Secondary | ICD-10-CM | POA: Diagnosis not present

## 2013-07-09 ENCOUNTER — Ambulatory Visit: Payer: BC Managed Care – PPO | Admitting: Family Medicine

## 2013-07-10 ENCOUNTER — Ambulatory Visit (INDEPENDENT_AMBULATORY_CARE_PROVIDER_SITE_OTHER): Payer: BC Managed Care – PPO | Admitting: Family Medicine

## 2013-07-10 DIAGNOSIS — I272 Pulmonary hypertension, unspecified: Secondary | ICD-10-CM

## 2013-07-10 DIAGNOSIS — I6350 Cerebral infarction due to unspecified occlusion or stenosis of unspecified cerebral artery: Secondary | ICD-10-CM

## 2013-07-10 DIAGNOSIS — I4891 Unspecified atrial fibrillation: Secondary | ICD-10-CM

## 2013-07-10 DIAGNOSIS — I2789 Other specified pulmonary heart diseases: Secondary | ICD-10-CM

## 2013-07-10 DIAGNOSIS — Z5181 Encounter for therapeutic drug level monitoring: Secondary | ICD-10-CM

## 2013-07-10 DIAGNOSIS — I639 Cerebral infarction, unspecified: Secondary | ICD-10-CM

## 2013-07-10 LAB — POCT INR: INR: 2.96

## 2013-07-12 DIAGNOSIS — N186 End stage renal disease: Secondary | ICD-10-CM | POA: Diagnosis not present

## 2013-07-13 DIAGNOSIS — D631 Anemia in chronic kidney disease: Secondary | ICD-10-CM | POA: Diagnosis not present

## 2013-07-13 DIAGNOSIS — N186 End stage renal disease: Secondary | ICD-10-CM | POA: Diagnosis not present

## 2013-07-13 DIAGNOSIS — D509 Iron deficiency anemia, unspecified: Secondary | ICD-10-CM | POA: Diagnosis not present

## 2013-07-14 ENCOUNTER — Ambulatory Visit (HOSPITAL_COMMUNITY)
Admission: RE | Admit: 2013-07-14 | Discharge: 2013-07-14 | Disposition: A | Discharge status: Home / Self Care | Payer: Medicare Other | Source: Ambulatory Visit | Attending: Internal Medicine | Admitting: Internal Medicine

## 2013-07-14 ENCOUNTER — Encounter: Payer: Self-pay | Admitting: Family Medicine

## 2013-07-14 ENCOUNTER — Encounter (HOSPITAL_BASED_OUTPATIENT_CLINIC_OR_DEPARTMENT_OTHER): Payer: Medicare Other

## 2013-07-14 ENCOUNTER — Ambulatory Visit (INDEPENDENT_AMBULATORY_CARE_PROVIDER_SITE_OTHER): Payer: Medicare Other | Admitting: Family Medicine

## 2013-07-14 VITALS — BP 98/64 | HR 85 | Ht 69.0 in | Wt 278.0 lb

## 2013-07-14 DIAGNOSIS — I70219 Atherosclerosis of native arteries of extremities with intermittent claudication, unspecified extremity: Secondary | ICD-10-CM

## 2013-07-14 DIAGNOSIS — M1710 Unilateral primary osteoarthritis, unspecified knee: Secondary | ICD-10-CM

## 2013-07-14 DIAGNOSIS — E1140 Type 2 diabetes mellitus with diabetic neuropathy, unspecified: Secondary | ICD-10-CM

## 2013-07-14 NOTE — Patient Instructions (Signed)
Good to see you Keep doing what you are doing Come back in 1 weeks.

## 2013-07-14 NOTE — Assessment & Plan Note (Signed)
The patient had Synvisc #2 injections in both knees today. Patient tolerated the procedure well. Patient will come back again in one week for the #3 injections in finish the series. Patient will continue with the icing, bracing, and home exercises. Will patient continues to make significant improvement.

## 2013-07-14 NOTE — Progress Notes (Signed)
[name] [place], [place] [address] Phone: [phone] Subjective:     CC: Bilateral knee pain follow up  [id number] Daniel Whitney is a 58 y.o. male patient is coming back for his bilateral knee pain. Patient is here for his second Synvisc injections of the knees bilaterally. Patient states that the knees are feeling somewhat better. Patient continues to wear the bracing as well as the icing and doing the home exercises fairly regularly. Otherwise patient is doing well. Patient did have to reschedule do to his brother been sick recently.      Past medical history, social, surgical and family history all reviewed in electronic medical record.   Review of Systems: No headache, visual changes, nausea, vomiting, diarrhea, constipation, dizziness, abdominal pain, skin rash, fevers, chills, night sweats, weight loss, swollen lymph nodes, body aches, joint swelling, muscle aches, chest pain, shortness of breath, mood changes.   Objective Blood pressure 98/64, pulse 85, height 5\' 9"  (1.753 m), weight 278 lb (126.1 kg), SpO2 91.00%.  General: No apparent distress alert and oriented x3 mood and affect normal, dressed appropriately. Morbid obesity.  HEENT: Pupils equal, extraocular movements intact patient though does not respond well to light in the right eye Respiratory: Patient's speak in full sentences and does not appear short of breath  Cardiovascular: No lower extremity edema, non tender, no erythema  Skin: Warm dry intact with no signs of infection or rash on extremities or on axial skeleton.  Abdomen: Soft nontender  Neuro: Cranial nerves II through XII are intact, neurovascularly intact in all extremities with 2+ DTRs and 2+ pulses.  Lymph: No lymphadenopathy of posterior or anterior cervical chain or axillae bilaterally.  Gait normal with good balance and coordination.  MSK:  Non tender with full range of motion and good stability and  symmetric strength and tone of shoulders, elbows, wrist, hip, and ankles bilaterally.  Knee: Bilateral On inspection significant osteoarthritic changes significant valgus deformity bilaterally  Tender to palpation over the medial joint line bilaterally ROM is restricted lacking the last 10 of extension and only 95 of flexion bilaterally Ligaments with solid consistent endpoints including ACL, PCL, LCL, MCL. Non painful patellar compression. Patellar glide without crepitus. Patellar and quadriceps tendons unremarkable. Hamstring and quadriceps strength is normal.  No change   Procedure: Real-time Ultrasound Guided Injection of right knee Device: GE Logiq E  Ultrasound guided injection is preferred based studies that show increased duration, increased effect, greater accuracy, decreased procedural pain, increased response rate, and decreased cost with ultrasound guided versus blind injection.  Verbal informed consent obtained.  Time-out conducted.  Noted no overlying erythema, induration, or other signs of local infection.  Skin prepped in a sterile fashion.  Local anesthesia: Topical Ethyl chloride.  With sterile technique and under real time ultrasound guidance: 16 mg/2.5 mL of Synvisc (sodium hyaluronate) in a prefilled syringe was injected easily into the knee through a 22-gauge needle. Completed without difficulty  Pain immediately resolved suggesting accurate placement of the medication.  Advised to call if fevers/chills, erythema, induration, drainage, or persistent bleeding.  Images permanently stored and available for review in the ultrasound unit.  Impression: Technically successful ultrasound guided injection.  Procedure: Real-time Ultrasound Guided Injection of left knee Device: GE Logiq E  Ultrasound guided injection is preferred based studies that show increased duration, increased effect, greater accuracy, decreased procedural pain, increased response rate, and decreased  cost with ultrasound guided versus blind injection.  Verbal informed consent  obtained.  Time-out conducted.  Noted no overlying erythema, induration, or other signs of local infection.  Skin prepped in a sterile fashion.  Local anesthesia: Topical Ethyl chloride.  With sterile technique and under real time ultrasound guidance: 16 mg/2.5 mL of Synvisc (sodium hyaluronate) in a prefilled syringe was injected easily into the knee through a 22-gauge needle. Completed without difficulty  Pain immediately resolved suggesting accurate placement of the medication.  Advised to call if fevers/chills, erythema, induration, drainage, or persistent bleeding.  Images permanently stored and available for review in the ultrasound unit.  Impression: Technically successful ultrasound guided injection. Body mass index is 41.03 kg/(m^2).    Impression and Recommendations:     This case required medical decision making of moderate complexity.

## 2013-07-14 NOTE — Progress Notes (Signed)
Arterial Duplex Lower Ext. Completed. Kielee Care, BS, RDMS, RVT  

## 2013-07-15 ENCOUNTER — Ambulatory Visit: Payer: BC Managed Care – PPO | Admitting: Cardiology

## 2013-07-16 ENCOUNTER — Encounter (HOSPITAL_COMMUNITY): Payer: BC Managed Care – PPO

## 2013-07-17 DIAGNOSIS — D631 Anemia in chronic kidney disease: Secondary | ICD-10-CM | POA: Diagnosis not present

## 2013-07-17 DIAGNOSIS — N039 Chronic nephritic syndrome with unspecified morphologic changes: Secondary | ICD-10-CM | POA: Diagnosis not present

## 2013-07-17 DIAGNOSIS — N186 End stage renal disease: Secondary | ICD-10-CM | POA: Diagnosis not present

## 2013-07-17 DIAGNOSIS — D509 Iron deficiency anemia, unspecified: Secondary | ICD-10-CM | POA: Diagnosis not present

## 2013-07-20 DIAGNOSIS — D631 Anemia in chronic kidney disease: Secondary | ICD-10-CM | POA: Diagnosis not present

## 2013-07-20 DIAGNOSIS — D509 Iron deficiency anemia, unspecified: Secondary | ICD-10-CM | POA: Diagnosis not present

## 2013-07-20 DIAGNOSIS — N039 Chronic nephritic syndrome with unspecified morphologic changes: Secondary | ICD-10-CM | POA: Diagnosis not present

## 2013-07-20 DIAGNOSIS — N186 End stage renal disease: Secondary | ICD-10-CM | POA: Diagnosis not present

## 2013-07-21 ENCOUNTER — Ambulatory Visit: Payer: BC Managed Care – PPO | Admitting: Internal Medicine

## 2013-07-21 DIAGNOSIS — Z0289 Encounter for other administrative examinations: Secondary | ICD-10-CM

## 2013-07-23 ENCOUNTER — Encounter: Payer: Self-pay | Admitting: Internal Medicine

## 2013-07-28 ENCOUNTER — Emergency Department (HOSPITAL_COMMUNITY): Payer: Medicare Other

## 2013-07-28 ENCOUNTER — Ambulatory Visit: Payer: Medicare Other | Admitting: Emergency Medicine

## 2013-07-28 ENCOUNTER — Emergency Department (HOSPITAL_COMMUNITY)
Admission: EM | Admit: 2013-07-28 | Discharge: 2013-07-28 | Disposition: A | Discharge status: Home / Self Care | Payer: Medicare Other | Attending: Emergency Medicine | Admitting: Emergency Medicine

## 2013-07-28 ENCOUNTER — Encounter (HOSPITAL_COMMUNITY): Payer: Self-pay | Admitting: Emergency Medicine

## 2013-07-28 DIAGNOSIS — M129 Arthropathy, unspecified: Secondary | ICD-10-CM | POA: Insufficient documentation

## 2013-07-28 DIAGNOSIS — S79929A Unspecified injury of unspecified thigh, initial encounter: Secondary | ICD-10-CM | POA: Diagnosis not present

## 2013-07-28 DIAGNOSIS — Z8673 Personal history of transient ischemic attack (TIA), and cerebral infarction without residual deficits: Secondary | ICD-10-CM | POA: Diagnosis not present

## 2013-07-28 DIAGNOSIS — E1139 Type 2 diabetes mellitus with other diabetic ophthalmic complication: Secondary | ICD-10-CM | POA: Insufficient documentation

## 2013-07-28 DIAGNOSIS — M542 Cervicalgia: Secondary | ICD-10-CM | POA: Diagnosis not present

## 2013-07-28 DIAGNOSIS — E1142 Type 2 diabetes mellitus with diabetic polyneuropathy: Secondary | ICD-10-CM | POA: Insufficient documentation

## 2013-07-28 DIAGNOSIS — S0993XA Unspecified injury of face, initial encounter: Secondary | ICD-10-CM | POA: Diagnosis not present

## 2013-07-28 DIAGNOSIS — E785 Hyperlipidemia, unspecified: Secondary | ICD-10-CM | POA: Insufficient documentation

## 2013-07-28 DIAGNOSIS — Z7901 Long term (current) use of anticoagulants: Secondary | ICD-10-CM | POA: Insufficient documentation

## 2013-07-28 DIAGNOSIS — I4891 Unspecified atrial fibrillation: Secondary | ICD-10-CM | POA: Insufficient documentation

## 2013-07-28 DIAGNOSIS — R791 Abnormal coagulation profile: Secondary | ICD-10-CM | POA: Diagnosis not present

## 2013-07-28 DIAGNOSIS — N186 End stage renal disease: Secondary | ICD-10-CM | POA: Diagnosis not present

## 2013-07-28 DIAGNOSIS — Y9289 Other specified places as the place of occurrence of the external cause: Secondary | ICD-10-CM | POA: Insufficient documentation

## 2013-07-28 DIAGNOSIS — E11319 Type 2 diabetes mellitus with unspecified diabetic retinopathy without macular edema: Secondary | ICD-10-CM | POA: Diagnosis not present

## 2013-07-28 DIAGNOSIS — Z992 Dependence on renal dialysis: Secondary | ICD-10-CM | POA: Diagnosis not present

## 2013-07-28 DIAGNOSIS — Z79899 Other long term (current) drug therapy: Secondary | ICD-10-CM | POA: Insufficient documentation

## 2013-07-28 DIAGNOSIS — M25559 Pain in unspecified hip: Secondary | ICD-10-CM | POA: Diagnosis not present

## 2013-07-28 DIAGNOSIS — Z792 Long term (current) use of antibiotics: Secondary | ICD-10-CM | POA: Diagnosis not present

## 2013-07-28 DIAGNOSIS — I509 Heart failure, unspecified: Secondary | ICD-10-CM | POA: Diagnosis not present

## 2013-07-28 DIAGNOSIS — G319 Degenerative disease of nervous system, unspecified: Secondary | ICD-10-CM | POA: Diagnosis not present

## 2013-07-28 DIAGNOSIS — I2789 Other specified pulmonary heart diseases: Secondary | ICD-10-CM | POA: Diagnosis not present

## 2013-07-28 DIAGNOSIS — W06XXXA Fall from bed, initial encounter: Secondary | ICD-10-CM | POA: Insufficient documentation

## 2013-07-28 DIAGNOSIS — Y9389 Activity, other specified: Secondary | ICD-10-CM | POA: Insufficient documentation

## 2013-07-28 DIAGNOSIS — I120 Hypertensive chronic kidney disease with stage 5 chronic kidney disease or end stage renal disease: Secondary | ICD-10-CM | POA: Insufficient documentation

## 2013-07-28 DIAGNOSIS — R51 Headache: Secondary | ICD-10-CM | POA: Diagnosis not present

## 2013-07-28 DIAGNOSIS — S79919A Unspecified injury of unspecified hip, initial encounter: Secondary | ICD-10-CM | POA: Diagnosis not present

## 2013-07-28 DIAGNOSIS — M25551 Pain in right hip: Secondary | ICD-10-CM

## 2013-07-28 DIAGNOSIS — I129 Hypertensive chronic kidney disease with stage 1 through stage 4 chronic kidney disease, or unspecified chronic kidney disease: Secondary | ICD-10-CM | POA: Diagnosis not present

## 2013-07-28 DIAGNOSIS — N189 Chronic kidney disease, unspecified: Secondary | ICD-10-CM | POA: Diagnosis not present

## 2013-07-28 DIAGNOSIS — S0990XA Unspecified injury of head, initial encounter: Secondary | ICD-10-CM | POA: Diagnosis not present

## 2013-07-28 DIAGNOSIS — W19XXXA Unspecified fall, initial encounter: Secondary | ICD-10-CM

## 2013-07-28 DIAGNOSIS — E1149 Type 2 diabetes mellitus with other diabetic neurological complication: Secondary | ICD-10-CM | POA: Insufficient documentation

## 2013-07-28 LAB — CBC WITH DIFFERENTIAL/PLATELET
Basophils Absolute: 0.1 10*3/uL (ref 0.0–0.1)
Basophils Relative: 1 % (ref 0–1)
EOS PCT: 3 % (ref 0–5)
Eosinophils Absolute: 0.2 10*3/uL (ref 0.0–0.7)
HEMATOCRIT: 34.8 % — AB (ref 39.0–52.0)
HEMOGLOBIN: 11.2 g/dL — AB (ref 13.0–17.0)
LYMPHS ABS: 1.9 10*3/uL (ref 0.7–4.0)
LYMPHS PCT: 24 % (ref 12–46)
MCH: 30.9 pg (ref 26.0–34.0)
MCHC: 32.2 g/dL (ref 30.0–36.0)
MCV: 95.9 fL (ref 78.0–100.0)
MONOS PCT: 8 % (ref 3–12)
Monocytes Absolute: 0.7 10*3/uL (ref 0.1–1.0)
Neutro Abs: 5.2 10*3/uL (ref 1.7–7.7)
Neutrophils Relative %: 64 % (ref 43–77)
PLATELETS: 184 10*3/uL (ref 150–400)
RBC: 3.63 MIL/uL — AB (ref 4.22–5.81)
RDW: 15.3 % (ref 11.5–15.5)
WBC: 8 10*3/uL (ref 4.0–10.5)

## 2013-07-28 LAB — URINALYSIS, ROUTINE W REFLEX MICROSCOPIC
Bilirubin Urine: NEGATIVE
Glucose, UA: NEGATIVE mg/dL
Ketones, ur: NEGATIVE mg/dL
Leukocytes, UA: NEGATIVE
Nitrite: NEGATIVE
Protein, ur: 100 mg/dL — AB
Specific Gravity, Urine: 1.015 (ref 1.005–1.030)
Urobilinogen, UA: 0.2 mg/dL (ref 0.0–1.0)
pH: 5 (ref 5.0–8.0)

## 2013-07-28 LAB — BASIC METABOLIC PANEL
BUN: 68 mg/dL — AB (ref 6–23)
CALCIUM: 9 mg/dL (ref 8.4–10.5)
CO2: 26 mEq/L (ref 19–32)
Chloride: 94 mEq/L — ABNORMAL LOW (ref 96–112)
Creatinine, Ser: 4.42 mg/dL — ABNORMAL HIGH (ref 0.50–1.35)
GFR calc Af Amer: 16 mL/min — ABNORMAL LOW (ref >90)
GFR, EST NON AFRICAN AMERICAN: 13 mL/min — AB (ref >90)
Glucose, Bld: 98 mg/dL (ref 70–99)
Potassium: 4.4 mEq/L (ref 3.7–5.3)
SODIUM: 132 meq/L — AB (ref 137–147)

## 2013-07-28 LAB — PROTIME-INR
INR: 4.76 — ABNORMAL HIGH (ref 0.00–1.49)
Prothrombin Time: 42.8 s — ABNORMAL HIGH (ref 11.6–15.2)

## 2013-07-28 LAB — URINE MICROSCOPIC-ADD ON

## 2013-07-28 MED ORDER — HYDROCODONE-ACETAMINOPHEN 5-325 MG PO TABS
2.0000 | ORAL_TABLET | Freq: Once | ORAL | Status: AC
  Administered 2013-07-28: 2 via ORAL
  Filled 2013-07-28: qty 2

## 2013-07-28 MED ORDER — HYDROCODONE-ACETAMINOPHEN 5-325 MG PO TABS: 1.0000 | ORAL_TABLET | Freq: Four times a day (QID) | ORAL | Status: DC | PRN

## 2013-07-28 MED ORDER — ONDANSETRON 8 MG PO TBDP: 8.0000 mg | ORAL_TABLET | Freq: Once | ORAL | Status: DC

## 2013-07-28 MED ORDER — ONDANSETRON HCL 4 MG PO TABS: 4.0000 mg | ORAL_TABLET | Freq: Four times a day (QID) | ORAL | Status: DC

## 2013-07-28 NOTE — Discharge Instructions (Signed)
It is very important you go to dialysis tomorrow. Your INR today was 4.69. Do not take your Coumadin for 24 hours. Please follow up with both your PCP and your orthopedic doctor about your visit today.   Hip Pain The hips join the upper legs to the lower pelvis. The bones, cartilage, tendons, and muscles of the hip joint perform a lot of work each day holding your body weight and allowing you to move around. Hip pain is a common symptom. It can range from a minor ache to severe pain on 1 or both hips. Pain may be felt on the inside of the hip joint near the groin, or the outside near the buttocks and upper thigh. There may be swelling or stiffness as well. It occurs more often when a person walks or performs activity. There are many reasons hip pain can develop. CAUSES  It is important to work with your caregiver to identify the cause since many conditions can impact the bones, cartilage, muscles, and tendons of the hips. Causes for hip pain include:  Broken (fractured) bones.  Separation of the thighbone from the hip socket (dislocation).  Torn cartilage of the hip joint.  Swelling (inflammation) of a tendon (tendonitis), the sac within the hip joint (bursitis), or a joint.  A weakening in the abdominal wall (hernia), affecting the nerves to the hip.  Arthritis in the hip joint or lining of the hip joint.  Pinched nerves in the back, hip, or upper thigh.  A bulging disc in the spine (herniated disc).  Rarely, bone infection or cancer. DIAGNOSIS  The location of your hip pain will help your caregiver understand what may be causing the pain. A diagnosis is based on your medical history, your symptoms, results from your physical exam, and results from diagnostic tests. Diagnostic tests may include X-ray exams, a computerized magnetic scan (magnetic resonance imaging, MRI), or bone scan. TREATMENT  Treatment will depend on the cause of your hip pain. Treatment may include:  Limiting  activities and resting until symptoms improve.  Crutches or other walking supports (a cane or brace).  Ice, elevation, and compression.  Physical therapy or home exercises.  Shoe inserts or special shoes.  Losing weight.  Medications to reduce pain.  Undergoing surgery. HOME CARE INSTRUCTIONS   Only take over-the-counter or prescription medicines for pain, discomfort, or fever as directed by your caregiver.  Put ice on the injured area:  Put ice in a plastic bag.  Place a towel between your skin and the bag.  Leave the ice on for 15-20 minutes at a time, 03-04 times a day.  Keep your leg raised (elevated) when possible to lessen swelling.  Avoid activities that cause pain.  Follow specific exercises as directed by your caregiver.  Sleep with a pillow between your legs on your most comfortable side.  Record how often you have hip pain, the location of the pain, and what it feels like. This information may be helpful to you and your caregiver.  Ask your caregiver about returning to work or sports and whether you should drive.  Follow up with your caregiver for further exams, therapy, or testing as directed. SEEK MEDICAL CARE IF:   Your pain or swelling continues or worsens after 1 week.  You are feeling unwell or have chills.  You have increasing difficulty with walking.  You have a loss of sensation or other new symptoms.  You have questions or concerns. SEEK IMMEDIATE MEDICAL CARE IF:  You cannot put weight on the affected hip.  You have fallen.  You have a sudden increase in pain and swelling in your hip.  You have a fever. MAKE SURE YOU:   Understand these instructions.  Will watch your condition.  Will get help right away if you are not doing well or get worse. Document Released: 07/19/2009 Document Revised: 04/23/2011 Document Reviewed: 07/19/2009 [facility] Patient Information 2014 [place] [address] [place].

## 2013-07-28 NOTE — ED Notes (Addendum)
Provided Pt with urinal and explained we needed a sample. Pt said he would notify when he could void

## 2013-07-28 NOTE — ED Notes (Signed)
Patient only has pain with movement.

## 2013-07-28 NOTE — Progress Notes (Signed)
Discussed patient with EDPA.

## 2013-07-28 NOTE — ED Notes (Signed)
Patient transported to CT 

## 2013-07-28 NOTE — Progress Notes (Signed)
  CARE MANAGEMENT ED NOTE 07/28/2013  Patient:  [facility]   Account Number:  [id number]  Date Initiated:  07/28/2013  Documentation initiated by:  Livia Snellen  Subjective/Objective Assessment:   Patient presents to ED post fall with injury to right hip     Subjective/Objective Assessment Detail:   Patient receives dialysis.     Action/Plan:   Action/Plan Detail:   Anticipated DC Date:       Status Recommendation to Physician:   Result of Recommendation:    Other ED Services  Consult Working [place]  Other    Choice offered to / List presented to:  C-1 Patient          Status of service:  Completed, signed off  ED Comments:   ED Comments Detail:  EDCM spoke to patient and his brother at bedside.  Patient lives with his brother.  Patient's brother works during the day.  Patient does not have home health services at this time.  Patient confirms he has chronic pain in his bilateral knees and lower back.  Patient imformed EDCM that he cannot walk after the fall he had.  Patient confirms his pcp is Dr. Cathlean Cower.  Patient has a cane at home. Patient reports he drives himself to dialysis treatments. Patient reports he is usually able to perform his ADL's without difficulty.  Patient informed EDCM that he has purchased a walker through his insurance one year ago. [facility] informed patient that his insurnace may not cover another walker and that he may have to pay out of pocket for one.  Patient verbalized understanding.  Patient is interested in home health.  EDCM explained to patient with home health he may receive a visiting RN, PT, OT, aide and Education officer, museum.  [facility] informed patient that the home health agency of his choice will have 24-48 hours to contact him. EDCM alos provided patient with a list of private duty nursing agencies and explained he would have to pay out of pocket for this service.  Patient verbalized understanding. Patient's bother inquiring about  transportation to Dialysis treatments.  [facility] provided patient with contact information for [facility] and [facility] medical transportation.  [facility] informed patient that his pcp could arrange home health for him as well.  No further EDCM needs at this time.

## 2013-07-28 NOTE — ED Notes (Addendum)
PT states he had fall Friday morning when he rolled out of bed onto floor. Pt complains of R hip and lower back pain and R knee pain, although this is chronic. PT states he has missed 2 dialysis appointments. Pt states he has difficulty bearing weight since then. PT states he fell again on Saturday when he tried to walk in it. Denies hitting head both times he fell.

## 2013-07-28 NOTE — ED Provider Notes (Signed)
CSN: [id number]     Arrival date & time 07/28/13  1827 History   First MD Initiated Contact with Patient 07/28/13 1853     Chief Complaint  Patient presents with  . Fall  . Hip Pain     (Consider location/radiation/quality/duration/timing/severity/associated sxs/prior Treatment) HPI Comments: Patient is a 58 year old male with history of diabetes, hypertension, end-stage renal disease on dialysis, hyperlipidemia, atrial fibrillation, congestive heart failure, morbid obesity who presents today with right hip pain. He reports that on Friday he was very groggy and fell out of bed. This occurred around 5 AM. He reports that this was a mechanical fall and that he was not feeling lightheaded or dizzy prior to the fall. He otherwise feels well. He did not hit his head or lose consciousness. He has a throbbing pain in his right hip. He has had a prior THA as well as revision. He has taken Tylenol without relief of his symptoms. He states that because he has not felt well he did not go to dialysis on Friday or Monday. He normally dialyzes Monday, Wednesday, Friday. He last dialyzed Wednesday. He denies any other pain, myalgias, headache, dizziness, lightheadedness, photophobia, nausea, vomiting, abdominal pain, chest pain, shortness of breath. He does still make urine. He denies any bowel or bladder incontinence, drug use, history of cancer.  The history is provided by the patient. No language interpreter was used.    Past Medical History  Diagnosis Date  . Arthritis   . Diabetes mellitus with neuropathy   . Glaucoma   . Hypertension   . ESRD (end stage renal disease) on dialysis   . Stroke   . Hyperlipidemia   . Atrial fibrillation 12/30/2012  . CHF (congestive heart failure) 12/30/2012  . Diabetic retinopathy 12/30/2012  . Morbid obesity 12/30/2012  . OSA (obstructive sleep apnea) 12/30/2012  . Pulmonary hypertension 12/30/2012   Past Surgical History  Procedure Laterality Date  . Total  hip arthroplasty Right 1980  . Av fistula placement Left    Family History  Problem Relation Age of Onset  . Arthritis Other   . Heart disease Other   . Hyperlipidemia Other   . Stroke Other   . Hypertension Other   . Kidney disease Other   . Diabetes Other    History  Substance Use Topics  . Smoking status: Never Smoker   . Smokeless tobacco: Never Used  . Alcohol Use: 1.5 - 2.0 oz/week    3-4 drink(s) per week    Review of Systems  Constitutional: Negative for fever and chills.  Respiratory: Negative for shortness of breath.   Cardiovascular: Negative for chest pain.  Musculoskeletal: Positive for arthralgias, gait problem and myalgias.  All other systems reviewed and are negative.     Allergies  Review of patient's allergies indicates no known allergies.  Home Medications   Prior to Admission medications   Medication Sig Start Date End Date Taking? Authorizing Leyan Branden  allopurinol (ZYLOPRIM) 100 MG tablet Take 100 mg by mouth 2 (two) times daily.    Historical Kanetra Ho, MD  amiodarone (PACERONE) 200 MG tablet Take 200 mg by mouth daily.    Historical Nicolle Heward, MD  amitriptyline (ELAVIL) 50 MG tablet Take 1 tablet (50 mg total) by mouth at bedtime. 06/04/13   Biagio Borg, MD  b complex-vitamin c-folic acid (NEPHRO-VITE) 0.8 MG TABS tablet Take 1 tablet by mouth daily.    Historical Lillybeth Tal, MD  carvedilol (COREG) 25 MG tablet Take 25 mg by mouth 2 (  two) times daily with a meal.    Historical Genevive Printup, MD  clindamycin (CLEOCIN T) 1 % lotion Apply topically 2 (two) times daily.    Historical Yancey Pedley, MD  Eflornithine HCl (VANIQA) 13.9 % cream Apply topically 2 (two) times daily with a meal.    Historical Jamilex Bohnsack, MD  OVER THE COUNTER MEDICATION 2 (two) times daily. Benzaderm Wash    Historical Shakiah Wester, MD  OVER THE COUNTER MEDICATION 1 application 2 (two) times daily. Melaquin Complex    Historical Primus Gritton, MD  predniSONE (DELTASONE) 10 MG tablet 3 tabs by mouth  per day for 3 days,2tabs per day for 3 days,1tab per day for 3 days 06/18/13   Biagio Borg, MD  sevelamer carbonate (RENVELA) 800 MG tablet Take 850 mg by mouth 3 (three) times daily as needed.     Historical Netha Dafoe, MD  simvastatin (ZOCOR) 20 MG tablet Take 20 mg by mouth daily.    Historical Keayra Graham, MD  sitaGLIPtin (JANUVIA) 25 MG tablet Take 1 tablet (25 mg total) by mouth daily. 06/04/13   Biagio Borg, MD  traMADol (ULTRAM) 50 MG tablet Take 1 tablet (50 mg total) by mouth every 8 (eight) hours as needed. 06/18/13   Biagio Borg, MD  valsartan (DIOVAN) 40 MG tablet Take 40 mg by mouth 4 (four) times a week.     Historical Lorrin Bodner, MD  warfarin (COUMADIN) 3 MG tablet Take as directed by anticoagulation clinic 03/27/13   Biagio Borg, MD   BP 150/90  Pulse 97  Temp(Src) 98.1 F (36.7 C) (Oral)  Resp 16  SpO2 91% Physical Exam  Nursing note and vitals reviewed. Constitutional: He is oriented to person, place, and time. He appears well-developed and well-nourished. No distress.  HENT:  Head: Normocephalic and atraumatic.  Right Ear: External ear normal.  Left Ear: External ear normal.  Nose: Nose normal.  Eyes: Conjunctivae and EOM are normal. Pupils are equal, round, and reactive to light.  Neck: Normal range of motion. No spinous process tenderness and no muscular tenderness present. No tracheal deviation present.  Cardiovascular: Normal rate, regular rhythm, normal heart sounds, intact distal pulses and normal pulses.   Pulses:      Dorsalis pedis pulses are 2+ on the right side, and 2+ on the left side.       Posterior tibial pulses are 2+ on the right side, and 2+ on the left side.  Pulmonary/Chest: Effort normal and breath sounds normal. No stridor.  Abdominal: Soft. He exhibits no distension. There is no tenderness.  Musculoskeletal: Normal range of motion.  Log roll test positive on right, negative on left.  SLR positive on right, negative on left.   Neurological: He is  alert and oriented to person, place, and time. He has normal strength. Gait (antalgic) abnormal. Coordination normal. GCS eye subscore is 4. GCS verbal subscore is 5. GCS motor subscore is 6.  Skin: Skin is warm and dry. He is not diaphoretic.  Psychiatric: He has a normal mood and affect. His behavior is normal.    ED Course  Procedures (including critical care time) Labs Review Labs Reviewed  CBC WITH DIFFERENTIAL - Abnormal; Notable for the following:    RBC 3.63 (*)    Hemoglobin 11.2 (*)    HCT 34.8 (*)    All other components within normal limits  URINALYSIS, ROUTINE W REFLEX MICROSCOPIC - Abnormal; Notable for the following:    Hgb urine dipstick MODERATE (*)    Protein, ur  100 (*)    All other components within normal limits  BASIC METABOLIC PANEL - Abnormal; Notable for the following:    Sodium 132 (*)    Chloride 94 (*)    BUN 68 (*)    Creatinine, Ser 4.42 (*)    GFR calc non Af Amer 13 (*)    GFR calc Af Amer 16 (*)    All other components within normal limits  PROTIME-INR - Abnormal; Notable for the following:    Prothrombin Time 42.8 (*)    INR 4.76 (*)    All other components within normal limits  URINE CULTURE  URINE MICROSCOPIC-ADD ON    Imaging Review Dg Hip Complete Right  07/28/2013   CLINICAL DATA:  Fall.  Right hip pain.  EXAM: RIGHT HIP - COMPLETE 2+ VIEW  COMPARISON:  None.  FINDINGS: Right total hip prosthesis with femoral head component more superior-lateral than expected, but not dislocated. Accordingly this probably represents wear of the polymer liner of the shell. There is some mild lucency along one of the fixation screws of the acetabular shell, along with several anchors and a fixation screw along the proximal stem and greater trochanter on the right. There is some lucency in the right proximal femoral Bone within oval-shaped appearance, significance uncertain.  Vascular calcifications noted. Spurring noted along the right acetabular margin.   IMPRESSION: 1. Asymmetry of alignment between the femoral head component and acetabular shell probably related to polymer wear. No dislocation is identified. 2. Bony spurring/irregularity of the acetabulum, likely chronic. 3. Anchors are present along the greater trochanter, and there is some lucency in the proximal femur extending between the greater and lesser trochanters. Significance of this lucency is on certain but it appears chronic and possibly related to particulate disease.   Electronically Signed   By: Sherryl Barters M.D.   On: 07/28/2013 19:51   Ct Head Wo Contrast  07/28/2013   CLINICAL DATA:  Trauma with neck pain  EXAM: CT HEAD WITHOUT CONTRAST  CT CERVICAL SPINE WITHOUT CONTRAST  TECHNIQUE: Multidetector CT imaging of the head and cervical spine was performed following the standard protocol without intravenous contrast. Multiplanar CT image reconstructions of the cervical spine were also generated.  COMPARISON:  None  FINDINGS: CT HEAD FINDINGS  No evidence of fracture or destructive process. Probable previous maxillary antrostomy on the right. There is mild inflammatory mucosal thickening, but no sinus effusion. No evidence of traumatic injury to the orbits. Mild bilateral proptosis related to increase in the retro-orbital fat.  No intracranial hemorrhage, hydrocephalus, mass lesion, infarct, or shift. There is periventricular white matter low attenuation which is likely chronic vessel disease in this patient with history of diabetes, hyperlipidemia, and hypertension.  CT CERVICAL SPINE FINDINGS  Negative for acute fracture or subluxation. No prevertebral edema. No gross cervical canal hematoma. No significant osseous canal or foraminal stenosis.  IMPRESSION: No acute intracranial or cervical spine injury.  Brain atrophy and chronic small vessel disease.   Electronically Signed   By: Jorje Guild M.D.   On: 07/28/2013 22:26   Ct Cervical Spine Wo Contrast  07/28/2013   CLINICAL DATA:   Trauma with neck pain  EXAM: CT HEAD WITHOUT CONTRAST  CT CERVICAL SPINE WITHOUT CONTRAST  TECHNIQUE: Multidetector CT imaging of the head and cervical spine was performed following the standard protocol without intravenous contrast. Multiplanar CT image reconstructions of the cervical spine were also generated.  COMPARISON:  None.  FINDINGS: CT HEAD FINDINGS  No evidence  of fracture or destructive process. Probable previous maxillary antrostomy on the right. There is mild inflammatory mucosal thickening, but no sinus effusion. No evidence of traumatic injury to the orbits. Mild bilateral proptosis related to increase in the retro-orbital fat.  No intracranial hemorrhage, hydrocephalus, mass lesion, infarct, or shift. There is periventricular white matter low attenuation which is likely chronic vessel disease in this patient with history of diabetes, hyperlipidemia, and hypertension.  CT CERVICAL SPINE FINDINGS  Negative for acute fracture or subluxation. No prevertebral edema. No gross cervical canal hematoma. No significant osseous canal or foraminal stenosis.  IMPRESSION: 1. No acute intracranial or cervical spine injury. 2. Brain atrophy and chronic small vessel disease.   Electronically Signed   By: Jorje Guild M.D.   On: 07/28/2013 21:53     EKG Interpretation None      MDM   Final diagnoses:  Right hip pain  CKD (chronic kidney disease)  Fall  Supratherapeutic INR    Patient presents to ED with right hip pain after fall on Friday. Patient is adamant this was a mechanical fall. No syncope or LOC. XR of hip shows chronic pathology, but nothing acute. Patent has been ambulatory since fall on Friday. Patient is able to ambulate in ED. Patient has missed dialysis x 2. He does not appear fluid overloaded. Normal K. Patient appears reliable and will go to dialysis tomorrow. Patient also with supra therapeutic INR. Instructed to not take Coumadin for 24 hours and follow up with PCP for INR  recheck. Patient was seen by case management and given home health resources. Discussed reasons to return to ED immediately. Vital signs stable for discharge. Discussed case with Dr. Jeneen Rinks who agrees with plan. Patient / Family / Caregiver informed of clinical course, understand medical decision-making process, and agree with plan.    Elwyn Lade, PA-C 07/29/13 [id number]

## 2013-07-28 NOTE — Progress Notes (Signed)
07/28/2013 A. Ferrero RNCM 2245pm Patient for discharge.  [facility] discussed patient with EDP.  Home health  orders placed for RN, PT, OT, aide and social work.  EDCM also received permission to place [facility] referral in for patient.  Patient has chosen [facility] for home health agency.  EDCM also placed order for wheelchair ramp.  Patient's brother reports he will purchase a walker for the patient this evening.  Patient ambulated with his cane without assistance in ED.  Patient reports he will be able to answer the door when homehealth agency arrives.  Patient's brother reports he will be able to adjust his schdule where he can be at home when home health agency arrives.  [facility] to [facility] at [phone] at 2249pm with confirmation of receipt at 2252pm.  No further [facility] needs at this time.

## 2013-07-29 DIAGNOSIS — D631 Anemia in chronic kidney disease: Secondary | ICD-10-CM | POA: Diagnosis not present

## 2013-07-29 DIAGNOSIS — D509 Iron deficiency anemia, unspecified: Secondary | ICD-10-CM | POA: Diagnosis not present

## 2013-07-29 DIAGNOSIS — N186 End stage renal disease: Secondary | ICD-10-CM | POA: Diagnosis not present

## 2013-07-30 LAB — URINE CULTURE
Colony Count: NO GROWTH
Culture: NO GROWTH

## 2013-07-31 DIAGNOSIS — D509 Iron deficiency anemia, unspecified: Secondary | ICD-10-CM | POA: Diagnosis not present

## 2013-07-31 DIAGNOSIS — D631 Anemia in chronic kidney disease: Secondary | ICD-10-CM | POA: Diagnosis not present

## 2013-07-31 DIAGNOSIS — N186 End stage renal disease: Secondary | ICD-10-CM | POA: Diagnosis not present

## 2013-08-01 NOTE — ED Provider Notes (Signed)
Medical screening examination/treatment/procedure(s) were conducted as a shared visit with non-physician practitioner(s) and myself.  I personally evaluated the patient during the encounter.   EKG Interpretation None      Care discussed with PA [facility]. Agree with her assessment and plan. Patient examined evidently. Presents several days after a fall. Complaining of hip pain but negative studies. Arrangements made for increasing his care at home with home health care. Patient agreeable.  I do think that this is an appropriate plan.  Tanna Furry, MD 08/01/13 [phone]

## 2013-08-03 ENCOUNTER — Ambulatory Visit: Payer: BC Managed Care – PPO | Admitting: Family Medicine

## 2013-08-03 DIAGNOSIS — N186 End stage renal disease: Secondary | ICD-10-CM | POA: Diagnosis not present

## 2013-08-03 DIAGNOSIS — D509 Iron deficiency anemia, unspecified: Secondary | ICD-10-CM | POA: Diagnosis not present

## 2013-08-03 DIAGNOSIS — D631 Anemia in chronic kidney disease: Secondary | ICD-10-CM | POA: Diagnosis not present

## 2013-08-04 ENCOUNTER — Encounter (HOSPITAL_BASED_OUTPATIENT_CLINIC_OR_DEPARTMENT_OTHER): Payer: Medicare Other

## 2013-08-04 ENCOUNTER — Ambulatory Visit (INDEPENDENT_AMBULATORY_CARE_PROVIDER_SITE_OTHER): Payer: Medicare Other | Admitting: Family Medicine

## 2013-08-04 ENCOUNTER — Encounter: Payer: Self-pay | Admitting: Family Medicine

## 2013-08-04 VITALS — BP 92/60 | HR 92 | Ht 69.0 in | Wt 278.0 lb

## 2013-08-04 DIAGNOSIS — M1710 Unilateral primary osteoarthritis, unspecified knee: Secondary | ICD-10-CM

## 2013-08-04 DIAGNOSIS — I70219 Atherosclerosis of native arteries of extremities with intermittent claudication, unspecified extremity: Secondary | ICD-10-CM | POA: Diagnosis not present

## 2013-08-04 DIAGNOSIS — M25551 Pain in right hip: Secondary | ICD-10-CM

## 2013-08-04 DIAGNOSIS — M25559 Pain in unspecified hip: Secondary | ICD-10-CM

## 2013-08-04 MED ORDER — HYDROCODONE-ACETAMINOPHEN 5-325 MG PO TABS: 1.0000 | ORAL_TABLET | Freq: Four times a day (QID) | ORAL | Status: DC | PRN

## 2013-08-04 NOTE — Patient Instructions (Signed)
It is good to see you Come back in 1 week for 3rd and final injection.  We will refer you to D.r Mayer Camel at [facility] They should call you fairly soon.

## 2013-08-04 NOTE — Assessment & Plan Note (Signed)
Looking at patient's x-ray and on physical exam today I am concerned that patient does have possible loosening of the arthroplasty. Patient is able to angulated can tell the patient is in severe pain. Patient's x-ray show lucency which can be concerning. Patient also does have significant asymmetry of the femoral head component compared to the acetabular alignment. I'm concerned that patient may need further intervention. I'm going to refer patient to Dr. Mayer Camel at [facility] orthopedics for further evaluation.

## 2013-08-04 NOTE — Progress Notes (Signed)
[facility] [address], [place] [address] Phone: [phone] Subjective:     CC: Bilateral knee pain follow up  [id number] Daniel Whitney is a 58 y.o. male patient is coming back for his bilateral knee pain. Patient is here for his second Synvisc injections of the knees bilaterally. Patient states that the knees are feeling better. Patient continues to wear the bracing as well as the icing and doing the home exercises fairly regularly. Otherwise patient is doing well.   Patient unfortunately in the meantime did have a fall. Patient fell onto his right side. Patient was seen in the emergency department and states that everything was fine. Patient was sent home with some pain medication and told he would be okay. Patient unfortunately continues to have right groin pain. Patient does have a past medical history significant for a total hip arthroplasty multiple years ago. This was done up [place]. Patient states that now he is almost constant dull aching pain that is very sharp when he does any type of movement. Patient did have x-rays of the hip. These were reviewed by me today. Patient's x-ray show he does have an asymmetry of alignment of the femoral head component and the acetabular shell. Patient also appears to have lucency in the proximal femur between the greater and lesser trochanteric area.     Past medical history, social, surgical and family history all reviewed in electronic medical record.   Review of Systems: No headache, visual changes, nausea, vomiting, diarrhea, constipation, dizziness, abdominal pain, skin rash, fevers, chills, night sweats, weight loss, swollen lymph nodes, body aches, joint swelling, muscle aches, chest pain, shortness of breath, mood changes.   Objective There were no vitals taken for this visit.  General: No apparent distress alert and oriented x3 mood and affect normal, dressed appropriately. Morbid obesity.  HEENT:  Pupils equal, extraocular movements intact patient though does not respond well to light in the right eye Respiratory: Patient's speak in full sentences and does not appear short of breath  Cardiovascular: No lower extremity edema, non tender, no erythema  Skin: Warm dry intact with no signs of infection or rash on extremities or on axial skeleton.  Abdomen: Soft nontender  Neuro: Cranial nerves II through XII are intact, neurovascularly intact in all extremities with 2+ DTRs and 2+ pulses.  Lymph: No lymphadenopathy of posterior or anterior cervical chain or axillae bilaterally.  Gait significant antalgic gait. MSK:  Non tender with full range of motion and good stability and symmetric strength and tone of shoulders, elbows, wrist,  and ankles bilaterally.  Hip: Right ROM IR: 15 Deg, ER: 35 Deg, Flexion: 100 Deg, Extension: 80 Deg, Abduction: 25 Deg, Adduction: 25 Deg Strength IR: 3/5, ER: 5/5, Flexion: 5/5, Extension: 5/5, Abduction: 4/5, Adduction: 5/5 Pelvic alignment unremarkable to inspection and palpation. Standing hip rotation and gait without trendelenburg sign / unsteadiness. Greater trochanter without tenderness to palpation. No tenderness over piriformis and greater trochanter. Positive pain with FADIR. No SI joint tenderness and normal minimal SI movement. Knee: Bilateral On inspection significant osteoarthritic changes significant valgus deformity bilaterally  Tender to palpation over the medial joint line bilaterally ROM is restricted lacking the last 10 of extension and only 95 of flexion bilaterally Ligaments with solid consistent endpoints including ACL, PCL, LCL, MCL. Non painful patellar compression. Patellar glide without crepitus. Patellar and quadriceps tendons unremarkable. Hamstring and quadriceps strength is normal.  No change   Procedure: Real-time Ultrasound Guided Injection of  right knee Device: GE Logiq E  Ultrasound guided injection is preferred based  studies that show increased duration, increased effect, greater accuracy, decreased procedural pain, increased response rate, and decreased cost with ultrasound guided versus blind injection.  Verbal informed consent obtained.  Time-out conducted.  Noted no overlying erythema, induration, or other signs of local infection.  Skin prepped in a sterile fashion.  Local anesthesia: Topical Ethyl chloride.  With sterile technique and under real time ultrasound guidance: 16 mg/2.5 mL of Synvisc (sodium hyaluronate) in a prefilled syringe was injected easily into the knee through a 22-gauge needle. Completed without difficulty  Pain immediately resolved suggesting accurate placement of the medication.  Advised to call if fevers/chills, erythema, induration, drainage, or persistent bleeding.  Images permanently stored and available for review in the ultrasound unit.  Impression: Technically successful ultrasound guided injection.  Procedure: Real-time Ultrasound Guided Injection of left knee Device: GE Logiq E  Ultrasound guided injection is preferred based studies that show increased duration, increased effect, greater accuracy, decreased procedural pain, increased response rate, and decreased cost with ultrasound guided versus blind injection.  Verbal informed consent obtained.  Time-out conducted.  Noted no overlying erythema, induration, or other signs of local infection.  Skin prepped in a sterile fashion.  Local anesthesia: Topical Ethyl chloride.  With sterile technique and under real time ultrasound guidance: 16 mg/2.5 mL of Synvisc (sodium hyaluronate) in a prefilled syringe was injected easily into the knee through a 22-gauge needle. Completed without difficulty  Pain immediately resolved suggesting accurate placement of the medication.  Advised to call if fevers/chills, erythema, induration, drainage, or persistent bleeding.  Images permanently stored and available for review in the  ultrasound unit.  Impression: Technically successful ultrasound guided injection.     Impression and Recommendations:     This case required medical decision making of moderate complexity.

## 2013-08-04 NOTE — Assessment & Plan Note (Signed)
Patient did have. Final injection. Patient will continue to wear the braces and doing home exercise program a regular basis. We discussed continuing the icing as well. Patient will come back and in 3-4 weeks for further evaluation and treatment. The patient braces he may need new ones secondary to failure of the Velcro. Patient will follow up in 4 weeks.

## 2013-08-05 ENCOUNTER — Ambulatory Visit: Payer: BC Managed Care – PPO | Admitting: Family Medicine

## 2013-08-05 DIAGNOSIS — N186 End stage renal disease: Secondary | ICD-10-CM | POA: Diagnosis not present

## 2013-08-05 DIAGNOSIS — I4891 Unspecified atrial fibrillation: Secondary | ICD-10-CM | POA: Diagnosis not present

## 2013-08-05 DIAGNOSIS — D509 Iron deficiency anemia, unspecified: Secondary | ICD-10-CM | POA: Diagnosis not present

## 2013-08-05 DIAGNOSIS — D631 Anemia in chronic kidney disease: Secondary | ICD-10-CM | POA: Diagnosis not present

## 2013-08-05 LAB — PROTIME-INR: INR: 2.8 — AB (ref <=1.1)

## 2013-08-07 ENCOUNTER — Ambulatory Visit (INDEPENDENT_AMBULATORY_CARE_PROVIDER_SITE_OTHER): Payer: BC Managed Care – PPO | Admitting: General Practice

## 2013-08-07 DIAGNOSIS — N186 End stage renal disease: Secondary | ICD-10-CM | POA: Diagnosis not present

## 2013-08-07 DIAGNOSIS — I272 Pulmonary hypertension, unspecified: Secondary | ICD-10-CM

## 2013-08-07 DIAGNOSIS — D631 Anemia in chronic kidney disease: Secondary | ICD-10-CM | POA: Diagnosis not present

## 2013-08-07 DIAGNOSIS — I639 Cerebral infarction, unspecified: Secondary | ICD-10-CM

## 2013-08-07 DIAGNOSIS — I2789 Other specified pulmonary heart diseases: Secondary | ICD-10-CM

## 2013-08-07 DIAGNOSIS — D509 Iron deficiency anemia, unspecified: Secondary | ICD-10-CM | POA: Diagnosis not present

## 2013-08-07 DIAGNOSIS — Z5181 Encounter for therapeutic drug level monitoring: Secondary | ICD-10-CM

## 2013-08-07 DIAGNOSIS — I6350 Cerebral infarction due to unspecified occlusion or stenosis of unspecified cerebral artery: Secondary | ICD-10-CM

## 2013-08-07 NOTE — Progress Notes (Signed)
Pre visit review using our clinic review tool, if applicable. No additional management support is needed unless otherwise documented below in the visit note. 

## 2013-08-11 ENCOUNTER — Ambulatory Visit: Payer: BC Managed Care – PPO | Admitting: Family Medicine

## 2013-08-11 DIAGNOSIS — N184 Chronic kidney disease, stage 4 (severe): Secondary | ICD-10-CM | POA: Diagnosis not present

## 2013-08-11 DIAGNOSIS — I4891 Unspecified atrial fibrillation: Secondary | ICD-10-CM | POA: Diagnosis not present

## 2013-08-11 DIAGNOSIS — Z9181 History of falling: Secondary | ICD-10-CM | POA: Diagnosis not present

## 2013-08-11 DIAGNOSIS — M25559 Pain in unspecified hip: Secondary | ICD-10-CM | POA: Diagnosis not present

## 2013-08-11 DIAGNOSIS — Z7901 Long term (current) use of anticoagulants: Secondary | ICD-10-CM | POA: Diagnosis not present

## 2013-08-11 DIAGNOSIS — I129 Hypertensive chronic kidney disease with stage 1 through stage 4 chronic kidney disease, or unspecified chronic kidney disease: Secondary | ICD-10-CM | POA: Diagnosis not present

## 2013-08-11 DIAGNOSIS — N186 End stage renal disease: Secondary | ICD-10-CM | POA: Diagnosis not present

## 2013-08-12 DIAGNOSIS — I4891 Unspecified atrial fibrillation: Secondary | ICD-10-CM | POA: Diagnosis not present

## 2013-08-12 DIAGNOSIS — N184 Chronic kidney disease, stage 4 (severe): Secondary | ICD-10-CM | POA: Diagnosis not present

## 2013-08-12 DIAGNOSIS — Z7901 Long term (current) use of anticoagulants: Secondary | ICD-10-CM | POA: Diagnosis not present

## 2013-08-12 DIAGNOSIS — M25559 Pain in unspecified hip: Secondary | ICD-10-CM | POA: Diagnosis not present

## 2013-08-12 DIAGNOSIS — Z9181 History of falling: Secondary | ICD-10-CM | POA: Diagnosis not present

## 2013-08-12 DIAGNOSIS — I129 Hypertensive chronic kidney disease with stage 1 through stage 4 chronic kidney disease, or unspecified chronic kidney disease: Secondary | ICD-10-CM | POA: Diagnosis not present

## 2013-08-13 DIAGNOSIS — I129 Hypertensive chronic kidney disease with stage 1 through stage 4 chronic kidney disease, or unspecified chronic kidney disease: Secondary | ICD-10-CM | POA: Diagnosis not present

## 2013-08-13 DIAGNOSIS — I4891 Unspecified atrial fibrillation: Secondary | ICD-10-CM | POA: Diagnosis not present

## 2013-08-13 DIAGNOSIS — N184 Chronic kidney disease, stage 4 (severe): Secondary | ICD-10-CM | POA: Diagnosis not present

## 2013-08-13 DIAGNOSIS — Z7901 Long term (current) use of anticoagulants: Secondary | ICD-10-CM | POA: Diagnosis not present

## 2013-08-13 DIAGNOSIS — Z9181 History of falling: Secondary | ICD-10-CM | POA: Diagnosis not present

## 2013-08-13 DIAGNOSIS — M25559 Pain in unspecified hip: Secondary | ICD-10-CM | POA: Diagnosis not present

## 2013-08-14 DIAGNOSIS — N184 Chronic kidney disease, stage 4 (severe): Secondary | ICD-10-CM | POA: Diagnosis not present

## 2013-08-14 DIAGNOSIS — M25559 Pain in unspecified hip: Secondary | ICD-10-CM | POA: Diagnosis not present

## 2013-08-14 DIAGNOSIS — Z9181 History of falling: Secondary | ICD-10-CM | POA: Diagnosis not present

## 2013-08-14 DIAGNOSIS — Z7901 Long term (current) use of anticoagulants: Secondary | ICD-10-CM | POA: Diagnosis not present

## 2013-08-14 DIAGNOSIS — I4891 Unspecified atrial fibrillation: Secondary | ICD-10-CM | POA: Diagnosis not present

## 2013-08-14 DIAGNOSIS — I129 Hypertensive chronic kidney disease with stage 1 through stage 4 chronic kidney disease, or unspecified chronic kidney disease: Secondary | ICD-10-CM | POA: Diagnosis not present

## 2013-08-15 DIAGNOSIS — N186 End stage renal disease: Secondary | ICD-10-CM | POA: Diagnosis not present

## 2013-08-15 DIAGNOSIS — D631 Anemia in chronic kidney disease: Secondary | ICD-10-CM | POA: Diagnosis not present

## 2013-08-15 DIAGNOSIS — D509 Iron deficiency anemia, unspecified: Secondary | ICD-10-CM | POA: Diagnosis not present

## 2013-08-17 DIAGNOSIS — D509 Iron deficiency anemia, unspecified: Secondary | ICD-10-CM | POA: Diagnosis not present

## 2013-08-17 DIAGNOSIS — I4891 Unspecified atrial fibrillation: Secondary | ICD-10-CM | POA: Diagnosis not present

## 2013-08-17 DIAGNOSIS — Z7901 Long term (current) use of anticoagulants: Secondary | ICD-10-CM | POA: Diagnosis not present

## 2013-08-17 DIAGNOSIS — N184 Chronic kidney disease, stage 4 (severe): Secondary | ICD-10-CM | POA: Diagnosis not present

## 2013-08-17 DIAGNOSIS — M25559 Pain in unspecified hip: Secondary | ICD-10-CM | POA: Diagnosis not present

## 2013-08-17 DIAGNOSIS — I129 Hypertensive chronic kidney disease with stage 1 through stage 4 chronic kidney disease, or unspecified chronic kidney disease: Secondary | ICD-10-CM | POA: Diagnosis not present

## 2013-08-17 DIAGNOSIS — N186 End stage renal disease: Secondary | ICD-10-CM | POA: Diagnosis not present

## 2013-08-17 DIAGNOSIS — D631 Anemia in chronic kidney disease: Secondary | ICD-10-CM | POA: Diagnosis not present

## 2013-08-17 DIAGNOSIS — Z9181 History of falling: Secondary | ICD-10-CM | POA: Diagnosis not present

## 2013-08-18 DIAGNOSIS — L84 Corns and callosities: Secondary | ICD-10-CM | POA: Diagnosis not present

## 2013-08-18 DIAGNOSIS — I4891 Unspecified atrial fibrillation: Secondary | ICD-10-CM | POA: Diagnosis not present

## 2013-08-18 DIAGNOSIS — M25559 Pain in unspecified hip: Secondary | ICD-10-CM | POA: Diagnosis not present

## 2013-08-18 DIAGNOSIS — I129 Hypertensive chronic kidney disease with stage 1 through stage 4 chronic kidney disease, or unspecified chronic kidney disease: Secondary | ICD-10-CM | POA: Diagnosis not present

## 2013-08-18 DIAGNOSIS — E1149 Type 2 diabetes mellitus with other diabetic neurological complication: Secondary | ICD-10-CM | POA: Diagnosis not present

## 2013-08-18 DIAGNOSIS — N184 Chronic kidney disease, stage 4 (severe): Secondary | ICD-10-CM | POA: Diagnosis not present

## 2013-08-18 DIAGNOSIS — Z7901 Long term (current) use of anticoagulants: Secondary | ICD-10-CM | POA: Diagnosis not present

## 2013-08-18 DIAGNOSIS — L608 Other nail disorders: Secondary | ICD-10-CM | POA: Diagnosis not present

## 2013-08-18 DIAGNOSIS — Z9181 History of falling: Secondary | ICD-10-CM | POA: Diagnosis not present

## 2013-08-19 DIAGNOSIS — I129 Hypertensive chronic kidney disease with stage 1 through stage 4 chronic kidney disease, or unspecified chronic kidney disease: Secondary | ICD-10-CM | POA: Diagnosis not present

## 2013-08-19 DIAGNOSIS — M25559 Pain in unspecified hip: Secondary | ICD-10-CM | POA: Diagnosis not present

## 2013-08-19 DIAGNOSIS — Z9181 History of falling: Secondary | ICD-10-CM | POA: Diagnosis not present

## 2013-08-19 DIAGNOSIS — Z7901 Long term (current) use of anticoagulants: Secondary | ICD-10-CM | POA: Diagnosis not present

## 2013-08-19 DIAGNOSIS — N039 Chronic nephritic syndrome with unspecified morphologic changes: Secondary | ICD-10-CM | POA: Diagnosis not present

## 2013-08-19 DIAGNOSIS — D509 Iron deficiency anemia, unspecified: Secondary | ICD-10-CM | POA: Diagnosis not present

## 2013-08-19 DIAGNOSIS — N186 End stage renal disease: Secondary | ICD-10-CM | POA: Diagnosis not present

## 2013-08-19 DIAGNOSIS — I4891 Unspecified atrial fibrillation: Secondary | ICD-10-CM | POA: Diagnosis not present

## 2013-08-19 DIAGNOSIS — D631 Anemia in chronic kidney disease: Secondary | ICD-10-CM | POA: Diagnosis not present

## 2013-08-19 DIAGNOSIS — N184 Chronic kidney disease, stage 4 (severe): Secondary | ICD-10-CM | POA: Diagnosis not present

## 2013-08-21 DIAGNOSIS — I4891 Unspecified atrial fibrillation: Secondary | ICD-10-CM | POA: Diagnosis not present

## 2013-08-21 DIAGNOSIS — I129 Hypertensive chronic kidney disease with stage 1 through stage 4 chronic kidney disease, or unspecified chronic kidney disease: Secondary | ICD-10-CM | POA: Diagnosis not present

## 2013-08-21 DIAGNOSIS — Z9181 History of falling: Secondary | ICD-10-CM | POA: Diagnosis not present

## 2013-08-21 DIAGNOSIS — N186 End stage renal disease: Secondary | ICD-10-CM | POA: Diagnosis not present

## 2013-08-21 DIAGNOSIS — D631 Anemia in chronic kidney disease: Secondary | ICD-10-CM | POA: Diagnosis not present

## 2013-08-21 DIAGNOSIS — N184 Chronic kidney disease, stage 4 (severe): Secondary | ICD-10-CM | POA: Diagnosis not present

## 2013-08-21 DIAGNOSIS — N039 Chronic nephritic syndrome with unspecified morphologic changes: Secondary | ICD-10-CM | POA: Diagnosis not present

## 2013-08-21 DIAGNOSIS — D509 Iron deficiency anemia, unspecified: Secondary | ICD-10-CM | POA: Diagnosis not present

## 2013-08-21 DIAGNOSIS — M25559 Pain in unspecified hip: Secondary | ICD-10-CM | POA: Diagnosis not present

## 2013-08-21 DIAGNOSIS — Z7901 Long term (current) use of anticoagulants: Secondary | ICD-10-CM | POA: Diagnosis not present

## 2013-08-22 ENCOUNTER — Telehealth: Payer: Self-pay | Admitting: Cardiology

## 2013-08-22 NOTE — Telephone Encounter (Signed)
Closed encounter °

## 2013-08-24 DIAGNOSIS — I129 Hypertensive chronic kidney disease with stage 1 through stage 4 chronic kidney disease, or unspecified chronic kidney disease: Secondary | ICD-10-CM | POA: Diagnosis not present

## 2013-08-24 DIAGNOSIS — M25559 Pain in unspecified hip: Secondary | ICD-10-CM | POA: Diagnosis not present

## 2013-08-24 DIAGNOSIS — Z9181 History of falling: Secondary | ICD-10-CM | POA: Diagnosis not present

## 2013-08-24 DIAGNOSIS — D631 Anemia in chronic kidney disease: Secondary | ICD-10-CM | POA: Diagnosis not present

## 2013-08-24 DIAGNOSIS — I4891 Unspecified atrial fibrillation: Secondary | ICD-10-CM | POA: Diagnosis not present

## 2013-08-24 DIAGNOSIS — N184 Chronic kidney disease, stage 4 (severe): Secondary | ICD-10-CM | POA: Diagnosis not present

## 2013-08-24 DIAGNOSIS — D509 Iron deficiency anemia, unspecified: Secondary | ICD-10-CM | POA: Diagnosis not present

## 2013-08-24 DIAGNOSIS — Z7901 Long term (current) use of anticoagulants: Secondary | ICD-10-CM | POA: Diagnosis not present

## 2013-08-24 DIAGNOSIS — N186 End stage renal disease: Secondary | ICD-10-CM | POA: Diagnosis not present

## 2013-08-25 DIAGNOSIS — N184 Chronic kidney disease, stage 4 (severe): Secondary | ICD-10-CM | POA: Diagnosis not present

## 2013-08-25 DIAGNOSIS — Z9181 History of falling: Secondary | ICD-10-CM | POA: Diagnosis not present

## 2013-08-25 DIAGNOSIS — M25559 Pain in unspecified hip: Secondary | ICD-10-CM | POA: Diagnosis not present

## 2013-08-25 DIAGNOSIS — Z7901 Long term (current) use of anticoagulants: Secondary | ICD-10-CM | POA: Diagnosis not present

## 2013-08-25 DIAGNOSIS — I4891 Unspecified atrial fibrillation: Secondary | ICD-10-CM | POA: Diagnosis not present

## 2013-08-25 DIAGNOSIS — I129 Hypertensive chronic kidney disease with stage 1 through stage 4 chronic kidney disease, or unspecified chronic kidney disease: Secondary | ICD-10-CM | POA: Diagnosis not present

## 2013-08-26 DIAGNOSIS — N184 Chronic kidney disease, stage 4 (severe): Secondary | ICD-10-CM | POA: Diagnosis not present

## 2013-08-26 DIAGNOSIS — M25559 Pain in unspecified hip: Secondary | ICD-10-CM | POA: Diagnosis not present

## 2013-08-26 DIAGNOSIS — I129 Hypertensive chronic kidney disease with stage 1 through stage 4 chronic kidney disease, or unspecified chronic kidney disease: Secondary | ICD-10-CM | POA: Diagnosis not present

## 2013-08-26 DIAGNOSIS — D509 Iron deficiency anemia, unspecified: Secondary | ICD-10-CM | POA: Diagnosis not present

## 2013-08-26 DIAGNOSIS — Z9181 History of falling: Secondary | ICD-10-CM | POA: Diagnosis not present

## 2013-08-26 DIAGNOSIS — D631 Anemia in chronic kidney disease: Secondary | ICD-10-CM | POA: Diagnosis not present

## 2013-08-26 DIAGNOSIS — N186 End stage renal disease: Secondary | ICD-10-CM | POA: Diagnosis not present

## 2013-08-26 DIAGNOSIS — Z7901 Long term (current) use of anticoagulants: Secondary | ICD-10-CM | POA: Diagnosis not present

## 2013-08-26 DIAGNOSIS — I4891 Unspecified atrial fibrillation: Secondary | ICD-10-CM | POA: Diagnosis not present

## 2013-08-27 DIAGNOSIS — M25559 Pain in unspecified hip: Secondary | ICD-10-CM | POA: Diagnosis not present

## 2013-08-27 DIAGNOSIS — N184 Chronic kidney disease, stage 4 (severe): Secondary | ICD-10-CM | POA: Diagnosis not present

## 2013-08-27 DIAGNOSIS — Z9181 History of falling: Secondary | ICD-10-CM | POA: Diagnosis not present

## 2013-08-27 DIAGNOSIS — I4891 Unspecified atrial fibrillation: Secondary | ICD-10-CM | POA: Diagnosis not present

## 2013-08-27 DIAGNOSIS — I129 Hypertensive chronic kidney disease with stage 1 through stage 4 chronic kidney disease, or unspecified chronic kidney disease: Secondary | ICD-10-CM | POA: Diagnosis not present

## 2013-08-27 DIAGNOSIS — Z7901 Long term (current) use of anticoagulants: Secondary | ICD-10-CM | POA: Diagnosis not present

## 2013-08-28 DIAGNOSIS — D509 Iron deficiency anemia, unspecified: Secondary | ICD-10-CM | POA: Diagnosis not present

## 2013-08-28 DIAGNOSIS — N186 End stage renal disease: Secondary | ICD-10-CM | POA: Diagnosis not present

## 2013-08-28 DIAGNOSIS — D631 Anemia in chronic kidney disease: Secondary | ICD-10-CM | POA: Diagnosis not present

## 2013-08-31 DIAGNOSIS — I4891 Unspecified atrial fibrillation: Secondary | ICD-10-CM | POA: Diagnosis not present

## 2013-08-31 DIAGNOSIS — I129 Hypertensive chronic kidney disease with stage 1 through stage 4 chronic kidney disease, or unspecified chronic kidney disease: Secondary | ICD-10-CM | POA: Diagnosis not present

## 2013-08-31 DIAGNOSIS — N184 Chronic kidney disease, stage 4 (severe): Secondary | ICD-10-CM | POA: Diagnosis not present

## 2013-08-31 DIAGNOSIS — D631 Anemia in chronic kidney disease: Secondary | ICD-10-CM | POA: Diagnosis not present

## 2013-08-31 DIAGNOSIS — Z9181 History of falling: Secondary | ICD-10-CM | POA: Diagnosis not present

## 2013-08-31 DIAGNOSIS — Z7901 Long term (current) use of anticoagulants: Secondary | ICD-10-CM | POA: Diagnosis not present

## 2013-08-31 DIAGNOSIS — M25559 Pain in unspecified hip: Secondary | ICD-10-CM | POA: Diagnosis not present

## 2013-08-31 DIAGNOSIS — D509 Iron deficiency anemia, unspecified: Secondary | ICD-10-CM | POA: Diagnosis not present

## 2013-08-31 DIAGNOSIS — N186 End stage renal disease: Secondary | ICD-10-CM | POA: Diagnosis not present

## 2013-08-31 DIAGNOSIS — N039 Chronic nephritic syndrome with unspecified morphologic changes: Secondary | ICD-10-CM | POA: Diagnosis not present

## 2013-09-02 DIAGNOSIS — I129 Hypertensive chronic kidney disease with stage 1 through stage 4 chronic kidney disease, or unspecified chronic kidney disease: Secondary | ICD-10-CM | POA: Diagnosis not present

## 2013-09-02 DIAGNOSIS — M25559 Pain in unspecified hip: Secondary | ICD-10-CM | POA: Diagnosis not present

## 2013-09-02 DIAGNOSIS — Z7901 Long term (current) use of anticoagulants: Secondary | ICD-10-CM | POA: Diagnosis not present

## 2013-09-02 DIAGNOSIS — Z9181 History of falling: Secondary | ICD-10-CM | POA: Diagnosis not present

## 2013-09-02 DIAGNOSIS — N184 Chronic kidney disease, stage 4 (severe): Secondary | ICD-10-CM | POA: Diagnosis not present

## 2013-09-02 DIAGNOSIS — I4891 Unspecified atrial fibrillation: Secondary | ICD-10-CM | POA: Diagnosis not present

## 2013-09-03 DIAGNOSIS — N184 Chronic kidney disease, stage 4 (severe): Secondary | ICD-10-CM | POA: Diagnosis not present

## 2013-09-03 DIAGNOSIS — I129 Hypertensive chronic kidney disease with stage 1 through stage 4 chronic kidney disease, or unspecified chronic kidney disease: Secondary | ICD-10-CM | POA: Diagnosis not present

## 2013-09-03 DIAGNOSIS — I4891 Unspecified atrial fibrillation: Secondary | ICD-10-CM | POA: Diagnosis not present

## 2013-09-03 DIAGNOSIS — Z9181 History of falling: Secondary | ICD-10-CM | POA: Diagnosis not present

## 2013-09-03 DIAGNOSIS — Z7901 Long term (current) use of anticoagulants: Secondary | ICD-10-CM | POA: Diagnosis not present

## 2013-09-03 DIAGNOSIS — M25559 Pain in unspecified hip: Secondary | ICD-10-CM | POA: Diagnosis not present

## 2013-09-04 ENCOUNTER — Telehealth: Payer: Self-pay | Admitting: Internal Medicine

## 2013-09-04 DIAGNOSIS — D509 Iron deficiency anemia, unspecified: Secondary | ICD-10-CM | POA: Diagnosis not present

## 2013-09-04 DIAGNOSIS — D631 Anemia in chronic kidney disease: Secondary | ICD-10-CM | POA: Diagnosis not present

## 2013-09-04 DIAGNOSIS — N186 End stage renal disease: Secondary | ICD-10-CM | POA: Diagnosis not present

## 2013-09-04 DIAGNOSIS — N039 Chronic nephritic syndrome with unspecified morphologic changes: Secondary | ICD-10-CM | POA: Diagnosis not present

## 2013-09-04 MED ORDER — CLINDAMYCIN PHOSPHATE 1 % EX LOTN: TOPICAL_LOTION | Freq: Two times a day (BID) | CUTANEOUS | Status: DC

## 2013-09-04 NOTE — Telephone Encounter (Signed)
Done erx 

## 2013-09-04 NOTE — Telephone Encounter (Signed)
LVM for pt indicating that Rx was sent to pharm on file.

## 2013-09-04 NOTE — Telephone Encounter (Signed)
Pt request refill for clindamycin to be send to [facility]. Please advise, drug store stated that they sent a fax on 08/31/13, never heard anything.

## 2013-09-07 DIAGNOSIS — I4891 Unspecified atrial fibrillation: Secondary | ICD-10-CM | POA: Diagnosis not present

## 2013-09-07 DIAGNOSIS — N186 End stage renal disease: Secondary | ICD-10-CM | POA: Diagnosis not present

## 2013-09-07 DIAGNOSIS — D631 Anemia in chronic kidney disease: Secondary | ICD-10-CM | POA: Diagnosis not present

## 2013-09-07 DIAGNOSIS — N039 Chronic nephritic syndrome with unspecified morphologic changes: Secondary | ICD-10-CM | POA: Diagnosis not present

## 2013-09-07 DIAGNOSIS — D509 Iron deficiency anemia, unspecified: Secondary | ICD-10-CM | POA: Diagnosis not present

## 2013-09-07 LAB — PROTIME-INR: INR: 1.7 — AB (ref <=1.1)

## 2013-09-08 ENCOUNTER — Ambulatory Visit (INDEPENDENT_AMBULATORY_CARE_PROVIDER_SITE_OTHER): Payer: PRIVATE HEALTH INSURANCE | Admitting: General Practice

## 2013-09-08 DIAGNOSIS — I2789 Other specified pulmonary heart diseases: Secondary | ICD-10-CM

## 2013-09-08 DIAGNOSIS — I6350 Cerebral infarction due to unspecified occlusion or stenosis of unspecified cerebral artery: Secondary | ICD-10-CM

## 2013-09-08 DIAGNOSIS — Z5181 Encounter for therapeutic drug level monitoring: Secondary | ICD-10-CM

## 2013-09-08 DIAGNOSIS — I272 Pulmonary hypertension, unspecified: Secondary | ICD-10-CM

## 2013-09-08 DIAGNOSIS — I639 Cerebral infarction, unspecified: Secondary | ICD-10-CM

## 2013-09-08 NOTE — Progress Notes (Signed)
Pre visit review using our clinic review tool, if applicable. No additional management support is needed unless otherwise documented below in the visit note. 

## 2013-09-09 DIAGNOSIS — D631 Anemia in chronic kidney disease: Secondary | ICD-10-CM | POA: Diagnosis not present

## 2013-09-09 DIAGNOSIS — D509 Iron deficiency anemia, unspecified: Secondary | ICD-10-CM | POA: Diagnosis not present

## 2013-09-09 DIAGNOSIS — N186 End stage renal disease: Secondary | ICD-10-CM | POA: Diagnosis not present

## 2013-09-11 DIAGNOSIS — N186 End stage renal disease: Secondary | ICD-10-CM | POA: Diagnosis not present

## 2013-09-11 DIAGNOSIS — D509 Iron deficiency anemia, unspecified: Secondary | ICD-10-CM | POA: Diagnosis not present

## 2013-09-11 DIAGNOSIS — D631 Anemia in chronic kidney disease: Secondary | ICD-10-CM | POA: Diagnosis not present

## 2013-09-11 DIAGNOSIS — N039 Chronic nephritic syndrome with unspecified morphologic changes: Secondary | ICD-10-CM | POA: Diagnosis not present

## 2013-09-14 DIAGNOSIS — D631 Anemia in chronic kidney disease: Secondary | ICD-10-CM | POA: Diagnosis not present

## 2013-09-14 DIAGNOSIS — N186 End stage renal disease: Secondary | ICD-10-CM | POA: Diagnosis not present

## 2013-09-14 DIAGNOSIS — N039 Chronic nephritic syndrome with unspecified morphologic changes: Secondary | ICD-10-CM | POA: Diagnosis not present

## 2013-09-14 DIAGNOSIS — D509 Iron deficiency anemia, unspecified: Secondary | ICD-10-CM | POA: Diagnosis not present

## 2013-09-15 DIAGNOSIS — I4891 Unspecified atrial fibrillation: Secondary | ICD-10-CM | POA: Diagnosis not present

## 2013-09-15 DIAGNOSIS — Z9181 History of falling: Secondary | ICD-10-CM | POA: Diagnosis not present

## 2013-09-15 DIAGNOSIS — M25559 Pain in unspecified hip: Secondary | ICD-10-CM | POA: Diagnosis not present

## 2013-09-15 DIAGNOSIS — Z7901 Long term (current) use of anticoagulants: Secondary | ICD-10-CM | POA: Diagnosis not present

## 2013-09-15 DIAGNOSIS — N184 Chronic kidney disease, stage 4 (severe): Secondary | ICD-10-CM | POA: Diagnosis not present

## 2013-09-15 DIAGNOSIS — I129 Hypertensive chronic kidney disease with stage 1 through stage 4 chronic kidney disease, or unspecified chronic kidney disease: Secondary | ICD-10-CM | POA: Diagnosis not present

## 2013-09-16 DIAGNOSIS — N186 End stage renal disease: Secondary | ICD-10-CM | POA: Diagnosis not present

## 2013-09-16 DIAGNOSIS — D509 Iron deficiency anemia, unspecified: Secondary | ICD-10-CM | POA: Diagnosis not present

## 2013-09-16 DIAGNOSIS — D631 Anemia in chronic kidney disease: Secondary | ICD-10-CM | POA: Diagnosis not present

## 2013-09-17 ENCOUNTER — Ambulatory Visit: Payer: BC Managed Care – PPO | Admitting: Cardiology

## 2013-09-17 ENCOUNTER — Encounter: Payer: Self-pay | Admitting: Cardiology

## 2013-09-17 DIAGNOSIS — I272 Pulmonary hypertension, unspecified: Secondary | ICD-10-CM | POA: Insufficient documentation

## 2013-09-18 DIAGNOSIS — D631 Anemia in chronic kidney disease: Secondary | ICD-10-CM | POA: Diagnosis not present

## 2013-09-18 DIAGNOSIS — D509 Iron deficiency anemia, unspecified: Secondary | ICD-10-CM | POA: Diagnosis not present

## 2013-09-18 DIAGNOSIS — N186 End stage renal disease: Secondary | ICD-10-CM | POA: Diagnosis not present

## 2013-09-22 ENCOUNTER — Telehealth: Payer: Self-pay | Admitting: Internal Medicine

## 2013-09-22 ENCOUNTER — Ambulatory Visit: Payer: Medicare Other | Admitting: Internal Medicine

## 2013-09-22 DIAGNOSIS — Z0289 Encounter for other administrative examinations: Secondary | ICD-10-CM

## 2013-09-22 NOTE — Telephone Encounter (Signed)
OK for ROV next available

## 2013-09-22 NOTE — Telephone Encounter (Signed)
Patient no showed for 3 month fu on 08/11.  Please advise.

## 2013-09-23 DIAGNOSIS — D631 Anemia in chronic kidney disease: Secondary | ICD-10-CM | POA: Diagnosis not present

## 2013-09-23 DIAGNOSIS — N039 Chronic nephritic syndrome with unspecified morphologic changes: Secondary | ICD-10-CM | POA: Diagnosis not present

## 2013-09-23 DIAGNOSIS — D509 Iron deficiency anemia, unspecified: Secondary | ICD-10-CM | POA: Diagnosis not present

## 2013-09-23 DIAGNOSIS — N186 End stage renal disease: Secondary | ICD-10-CM | POA: Diagnosis not present

## 2013-09-23 NOTE — Telephone Encounter (Signed)
Left patient a vm to call back to reschedule.

## 2013-09-25 DIAGNOSIS — N186 End stage renal disease: Secondary | ICD-10-CM | POA: Diagnosis not present

## 2013-09-25 DIAGNOSIS — N039 Chronic nephritic syndrome with unspecified morphologic changes: Secondary | ICD-10-CM | POA: Diagnosis not present

## 2013-09-25 DIAGNOSIS — D631 Anemia in chronic kidney disease: Secondary | ICD-10-CM | POA: Diagnosis not present

## 2013-09-25 DIAGNOSIS — D509 Iron deficiency anemia, unspecified: Secondary | ICD-10-CM | POA: Diagnosis not present

## 2013-09-28 DIAGNOSIS — D631 Anemia in chronic kidney disease: Secondary | ICD-10-CM | POA: Diagnosis not present

## 2013-09-28 DIAGNOSIS — D509 Iron deficiency anemia, unspecified: Secondary | ICD-10-CM | POA: Diagnosis not present

## 2013-09-28 DIAGNOSIS — N186 End stage renal disease: Secondary | ICD-10-CM | POA: Diagnosis not present

## 2013-10-01 DIAGNOSIS — T82898A Other specified complication of vascular prosthetic devices, implants and grafts, initial encounter: Secondary | ICD-10-CM | POA: Diagnosis not present

## 2013-10-01 DIAGNOSIS — I871 Compression of vein: Secondary | ICD-10-CM | POA: Diagnosis not present

## 2013-10-01 DIAGNOSIS — N186 End stage renal disease: Secondary | ICD-10-CM | POA: Diagnosis not present

## 2013-10-02 DIAGNOSIS — N039 Chronic nephritic syndrome with unspecified morphologic changes: Secondary | ICD-10-CM | POA: Diagnosis not present

## 2013-10-02 DIAGNOSIS — N186 End stage renal disease: Secondary | ICD-10-CM | POA: Diagnosis not present

## 2013-10-02 DIAGNOSIS — D509 Iron deficiency anemia, unspecified: Secondary | ICD-10-CM | POA: Diagnosis not present

## 2013-10-02 DIAGNOSIS — D631 Anemia in chronic kidney disease: Secondary | ICD-10-CM | POA: Diagnosis not present

## 2013-10-05 DIAGNOSIS — D509 Iron deficiency anemia, unspecified: Secondary | ICD-10-CM | POA: Diagnosis not present

## 2013-10-05 DIAGNOSIS — N186 End stage renal disease: Secondary | ICD-10-CM | POA: Diagnosis not present

## 2013-10-05 DIAGNOSIS — D631 Anemia in chronic kidney disease: Secondary | ICD-10-CM | POA: Diagnosis not present

## 2013-10-07 DIAGNOSIS — N186 End stage renal disease: Secondary | ICD-10-CM | POA: Diagnosis not present

## 2013-10-07 DIAGNOSIS — I4891 Unspecified atrial fibrillation: Secondary | ICD-10-CM | POA: Diagnosis not present

## 2013-10-07 DIAGNOSIS — D509 Iron deficiency anemia, unspecified: Secondary | ICD-10-CM | POA: Diagnosis not present

## 2013-10-07 DIAGNOSIS — D631 Anemia in chronic kidney disease: Secondary | ICD-10-CM | POA: Diagnosis not present

## 2013-10-08 ENCOUNTER — Ambulatory Visit (INDEPENDENT_AMBULATORY_CARE_PROVIDER_SITE_OTHER): Payer: PRIVATE HEALTH INSURANCE | Admitting: *Deleted

## 2013-10-08 DIAGNOSIS — Z9181 History of falling: Secondary | ICD-10-CM | POA: Diagnosis not present

## 2013-10-08 DIAGNOSIS — M25559 Pain in unspecified hip: Secondary | ICD-10-CM | POA: Diagnosis not present

## 2013-10-08 DIAGNOSIS — Z7901 Long term (current) use of anticoagulants: Secondary | ICD-10-CM | POA: Diagnosis not present

## 2013-10-08 DIAGNOSIS — N184 Chronic kidney disease, stage 4 (severe): Secondary | ICD-10-CM | POA: Diagnosis not present

## 2013-10-08 DIAGNOSIS — I6350 Cerebral infarction due to unspecified occlusion or stenosis of unspecified cerebral artery: Secondary | ICD-10-CM

## 2013-10-08 DIAGNOSIS — I4891 Unspecified atrial fibrillation: Secondary | ICD-10-CM | POA: Diagnosis not present

## 2013-10-08 DIAGNOSIS — Z5181 Encounter for therapeutic drug level monitoring: Secondary | ICD-10-CM

## 2013-10-08 DIAGNOSIS — I639 Cerebral infarction, unspecified: Secondary | ICD-10-CM

## 2013-10-08 DIAGNOSIS — I2789 Other specified pulmonary heart diseases: Secondary | ICD-10-CM

## 2013-10-08 DIAGNOSIS — I129 Hypertensive chronic kidney disease with stage 1 through stage 4 chronic kidney disease, or unspecified chronic kidney disease: Secondary | ICD-10-CM | POA: Diagnosis not present

## 2013-10-08 DIAGNOSIS — I272 Pulmonary hypertension, unspecified: Secondary | ICD-10-CM

## 2013-10-08 LAB — POCT INR: INR: 4.19

## 2013-10-09 DIAGNOSIS — D631 Anemia in chronic kidney disease: Secondary | ICD-10-CM | POA: Diagnosis not present

## 2013-10-09 DIAGNOSIS — D509 Iron deficiency anemia, unspecified: Secondary | ICD-10-CM | POA: Diagnosis not present

## 2013-10-09 DIAGNOSIS — N039 Chronic nephritic syndrome with unspecified morphologic changes: Secondary | ICD-10-CM | POA: Diagnosis not present

## 2013-10-09 DIAGNOSIS — N186 End stage renal disease: Secondary | ICD-10-CM | POA: Diagnosis not present

## 2013-10-12 DIAGNOSIS — D631 Anemia in chronic kidney disease: Secondary | ICD-10-CM | POA: Diagnosis not present

## 2013-10-12 DIAGNOSIS — N186 End stage renal disease: Secondary | ICD-10-CM | POA: Diagnosis not present

## 2013-10-12 DIAGNOSIS — D509 Iron deficiency anemia, unspecified: Secondary | ICD-10-CM | POA: Diagnosis not present

## 2013-10-14 DIAGNOSIS — N039 Chronic nephritic syndrome with unspecified morphologic changes: Secondary | ICD-10-CM | POA: Diagnosis not present

## 2013-10-14 DIAGNOSIS — D509 Iron deficiency anemia, unspecified: Secondary | ICD-10-CM | POA: Diagnosis not present

## 2013-10-14 DIAGNOSIS — N186 End stage renal disease: Secondary | ICD-10-CM | POA: Diagnosis not present

## 2013-10-14 DIAGNOSIS — D631 Anemia in chronic kidney disease: Secondary | ICD-10-CM | POA: Diagnosis not present

## 2013-10-16 DIAGNOSIS — N186 End stage renal disease: Secondary | ICD-10-CM | POA: Diagnosis not present

## 2013-10-16 DIAGNOSIS — D509 Iron deficiency anemia, unspecified: Secondary | ICD-10-CM | POA: Diagnosis not present

## 2013-10-16 DIAGNOSIS — D631 Anemia in chronic kidney disease: Secondary | ICD-10-CM | POA: Diagnosis not present

## 2013-10-16 DIAGNOSIS — N039 Chronic nephritic syndrome with unspecified morphologic changes: Secondary | ICD-10-CM | POA: Diagnosis not present

## 2013-10-19 DIAGNOSIS — N186 End stage renal disease: Secondary | ICD-10-CM | POA: Diagnosis not present

## 2013-10-19 DIAGNOSIS — D509 Iron deficiency anemia, unspecified: Secondary | ICD-10-CM | POA: Diagnosis not present

## 2013-10-19 DIAGNOSIS — D631 Anemia in chronic kidney disease: Secondary | ICD-10-CM | POA: Diagnosis not present

## 2013-10-20 ENCOUNTER — Telehealth: Payer: Self-pay | Admitting: *Deleted

## 2013-10-20 NOTE — Telephone Encounter (Signed)
Left msg on triage stating he need to get a referral to see dermatologist. Called pt back no answer LMOM will need to see md before referral can be place...Daniel Whitney

## 2013-10-21 DIAGNOSIS — N039 Chronic nephritic syndrome with unspecified morphologic changes: Secondary | ICD-10-CM | POA: Diagnosis not present

## 2013-10-21 DIAGNOSIS — N186 End stage renal disease: Secondary | ICD-10-CM | POA: Diagnosis not present

## 2013-10-21 DIAGNOSIS — D509 Iron deficiency anemia, unspecified: Secondary | ICD-10-CM | POA: Diagnosis not present

## 2013-10-21 DIAGNOSIS — D631 Anemia in chronic kidney disease: Secondary | ICD-10-CM | POA: Diagnosis not present

## 2013-10-23 DIAGNOSIS — D631 Anemia in chronic kidney disease: Secondary | ICD-10-CM | POA: Diagnosis not present

## 2013-10-23 DIAGNOSIS — D509 Iron deficiency anemia, unspecified: Secondary | ICD-10-CM | POA: Diagnosis not present

## 2013-10-23 DIAGNOSIS — N186 End stage renal disease: Secondary | ICD-10-CM | POA: Diagnosis not present

## 2013-10-26 DIAGNOSIS — D509 Iron deficiency anemia, unspecified: Secondary | ICD-10-CM | POA: Diagnosis not present

## 2013-10-26 DIAGNOSIS — N186 End stage renal disease: Secondary | ICD-10-CM | POA: Diagnosis not present

## 2013-10-26 DIAGNOSIS — D631 Anemia in chronic kidney disease: Secondary | ICD-10-CM | POA: Diagnosis not present

## 2013-10-27 ENCOUNTER — Encounter: Payer: Self-pay | Admitting: Internal Medicine

## 2013-10-27 ENCOUNTER — Ambulatory Visit (INDEPENDENT_AMBULATORY_CARE_PROVIDER_SITE_OTHER): Payer: Medicare Other | Admitting: Internal Medicine

## 2013-10-27 VITALS — BP 132/64 | HR 87 | Temp 98.1°F | Ht 69.0 in | Wt 272.4 lb

## 2013-10-27 DIAGNOSIS — E1142 Type 2 diabetes mellitus with diabetic polyneuropathy: Secondary | ICD-10-CM

## 2013-10-27 DIAGNOSIS — E1165 Type 2 diabetes mellitus with hyperglycemia: Secondary | ICD-10-CM

## 2013-10-27 DIAGNOSIS — E1140 Type 2 diabetes mellitus with diabetic neuropathy, unspecified: Secondary | ICD-10-CM

## 2013-10-27 DIAGNOSIS — Z23 Encounter for immunization: Secondary | ICD-10-CM

## 2013-10-27 DIAGNOSIS — R21 Rash and other nonspecific skin eruption: Secondary | ICD-10-CM | POA: Diagnosis not present

## 2013-10-27 DIAGNOSIS — IMO0001 Reserved for inherently not codable concepts without codable children: Secondary | ICD-10-CM | POA: Diagnosis not present

## 2013-10-27 DIAGNOSIS — E1149 Type 2 diabetes mellitus with other diabetic neurological complication: Secondary | ICD-10-CM

## 2013-10-27 DIAGNOSIS — H6091 Unspecified otitis externa, right ear: Secondary | ICD-10-CM | POA: Insufficient documentation

## 2013-10-27 DIAGNOSIS — H60399 Other infective otitis externa, unspecified ear: Secondary | ICD-10-CM

## 2013-10-27 DIAGNOSIS — I10 Essential (primary) hypertension: Secondary | ICD-10-CM

## 2013-10-27 DIAGNOSIS — I70219 Atherosclerosis of native arteries of extremities with intermittent claudication, unspecified extremity: Secondary | ICD-10-CM

## 2013-10-27 MED ORDER — CIPROFLOXACIN HCL 250 MG PO TABS: 250.0000 mg | ORAL_TABLET | Freq: Two times a day (BID) | ORAL | Status: DC

## 2013-10-27 MED ORDER — HYDROCODONE-ACETAMINOPHEN 5-325 MG PO TABS: 1.0000 | ORAL_TABLET | Freq: Four times a day (QID) | ORAL | Status: DC | PRN

## 2013-10-27 MED ORDER — NEOMYCIN-POLYMYXIN-HC 3.5-10000-1 OT SOLN: 4.0000 [drp] | Freq: Four times a day (QID) | OTIC | Status: DC

## 2013-10-27 MED ORDER — TADALAFIL 20 MG PO TABS: 20.0000 mg | ORAL_TABLET | Freq: Every day | ORAL | Status: DC | PRN

## 2013-10-27 NOTE — Progress Notes (Signed)
Subjective:    Patient ID: Daniel Whitney, male    DOB: 08/23/1955, 58 y.o.   MRN: [id number]  HPI  Here with 3 days onset right ear pain, no fever or d/c, but moderate, not better or worse with anything, no ST or cough , and Pt denies chest pain, increased sob or doe, wheezing, orthopnea, PND, increased LE swelling, palpitations, dizziness or syncope.  Pt denies new neurological symptoms such as new headache, or facial or extremity weakness or numbness  Has had worsening ED symptoms in the past 6 mo as well/  Also has an unusual facial fash to forehead, NT, asks for derm referral.  Pt denies polydipsia, polyuria.  Past Medical History  Diagnosis Date  . Arthritis   . Diabetes mellitus with neuropathy   . Glaucoma   . Hypertension   . ESRD (end stage renal disease) on dialysis   . Stroke   . Hyperlipidemia   . Atrial fibrillation 12/30/2012  . CHF (congestive heart failure) 12/30/2012  . Diabetic retinopathy 12/30/2012  . Morbid obesity 12/30/2012  . OSA (obstructive sleep apnea) 12/30/2012  . Pulmonary hypertension 12/30/2012   Past Surgical History  Procedure Laterality Date  . Total hip arthroplasty Right 1980  . Av fistula placement Left     reports that he has never smoked. He has never used smokeless tobacco. He reports that he drinks about 1.5 - 2 ounces of alcohol per week. He reports that he does not use illicit drugs. family history includes Arthritis in his other; Diabetes in his other; Heart disease in his other; Hyperlipidemia in his other; Hypertension in his other; Kidney disease in his other; Stroke in his other. No Known Allergies Current Outpatient Prescriptions on File Prior to Visit  Medication Sig Dispense Refill  . allopurinol (ZYLOPRIM) 100 MG tablet Take 100 mg by mouth 2 (two) times daily.      Marland Kitchen amiodarone (PACERONE) 200 MG tablet Take 200 mg by mouth daily.      Marland Kitchen amitriptyline (ELAVIL) 50 MG tablet Take 1 tablet (50 mg total) by mouth at bedtime.  90  tablet  1  . b complex-vitamin c-folic acid (NEPHRO-VITE) 0.8 MG TABS tablet Take 1 tablet by mouth daily.      . carvedilol (COREG) 25 MG tablet Take 25 mg by mouth 2 (two) times daily with a meal.      . clindamycin (CLEOCIN T) 1 % lotion Apply topically 2 (two) times daily.  60 mL  0  . Eflornithine HCl (VANIQA) 13.9 % cream Apply topically 2 (two) times daily with a meal.      . ondansetron (ZOFRAN) 4 MG tablet Take 1 tablet (4 mg total) by mouth every 6 (six) hours.  12 tablet  0  . OVER THE COUNTER MEDICATION 2 (two) times daily. Benzaderm Wash      . OVER THE COUNTER MEDICATION 1 application 2 (two) times daily. Melaquin Complex      . sevelamer carbonate (RENVELA) 800 MG tablet Take 850 mg by mouth 3 (three) times daily with meals.       . simvastatin (ZOCOR) 20 MG tablet Take 20 mg by mouth daily.      . sitaGLIPtin (JANUVIA) 25 MG tablet Take 1 tablet (25 mg total) by mouth daily.  90 tablet  3  . traMADol (ULTRAM) 50 MG tablet Take 50 mg by mouth every 8 (eight) hours as needed (pain).      . valsartan (DIOVAN) 40 MG tablet Take  40 mg by mouth 4 (four) times a week.       . warfarin (COUMADIN) 3 MG tablet Take 3-4.5 mg by mouth See admin instructions. Thursday 1.5 tablet and on the remaining days you take 1 tabletTake as directed by anticoagulation clinic       No current facility-administered medications on file prior to visit.   Review of Systems  Constitutional: Negative for unusual diaphoresis or other sweats  HENT: Negative for ringing in ear Eyes: Negative for double vision or worsening visual disturbance.  Respiratory: Negative for choking and stridor.   Gastrointestinal: Negative for vomiting or other signifcant bowel change Genitourinary: Negative for hematuria or decreased urine volume.  Musculoskeletal: Negative for other MSK pain or swelling Skin: Negative for color change and worsening wound.  Neurological: Negative for tremors and numbness other than noted    Psychiatric/Behavioral: Negative for decreased concentration or agitation other than above       Objective:   Physical Exam BP 132/64  Pulse 87  Temp(Src) 98.1 F (36.7 C) (Oral)  Ht 5\' 9"  (1.753 m)  Wt 272 lb 6 oz (123.548 kg)  BMI 40.20 kg/m2  SpO2 94% VS noted,  Constitutional: Pt appears well-developed, well-nourished.  HENT: Head: NCAT.  Right Ear: External ear normal.  Left Ear: External ear normal.  Eyes: . Pupils are equal, round, and reactive to light. Conjunctivae and EOM are normal Right ear canal with mild to mod erythema, swelling, mucoid d/c Neck: Normal range of motion. Neck supple.  Cardiovascular: Normal rate and regular rhythm.   Pulmonary/Chest: Effort normal and breath sounds normal.  Neurological: Pt is alert. Not confused , motor grossly intact. Cn 2-12 intat Skin: Skin is warm. numerous small erythem macules to forehead, NT Psychiatric: Pt behavior is normal. No agitation.     Assessment & Plan:

## 2013-10-27 NOTE — Patient Instructions (Signed)
You had the flu shot today  Please take all new medication as prescribed - the drops and pills for the infection  Please continue all other medications as before, and refills have been done if requested - the hydrocodone  Please have the pharmacy call with any other refills you may need.  Please keep your appointments with your specialists as you may have planned  Please go to the LAB in the Basement (turn left off the elevator) for the tests to be done at your convenience  You will be contacted by phone if any changes need to be made immediately.  Otherwise, you will receive a letter about your results with an explanation, but please check with MyChart first.  Please return in 6 months, or sooner if needed

## 2013-10-27 NOTE — Progress Notes (Signed)
Pre visit review using our clinic review tool, if applicable. No additional management support is needed unless otherwise documented below in the visit note. 

## 2013-10-28 DIAGNOSIS — N186 End stage renal disease: Secondary | ICD-10-CM | POA: Diagnosis not present

## 2013-10-28 DIAGNOSIS — D631 Anemia in chronic kidney disease: Secondary | ICD-10-CM | POA: Diagnosis not present

## 2013-10-28 DIAGNOSIS — D509 Iron deficiency anemia, unspecified: Secondary | ICD-10-CM | POA: Diagnosis not present

## 2013-10-30 DIAGNOSIS — D509 Iron deficiency anemia, unspecified: Secondary | ICD-10-CM | POA: Diagnosis not present

## 2013-10-30 DIAGNOSIS — D631 Anemia in chronic kidney disease: Secondary | ICD-10-CM | POA: Diagnosis not present

## 2013-10-30 DIAGNOSIS — N039 Chronic nephritic syndrome with unspecified morphologic changes: Secondary | ICD-10-CM | POA: Diagnosis not present

## 2013-10-30 DIAGNOSIS — N186 End stage renal disease: Secondary | ICD-10-CM | POA: Diagnosis not present

## 2013-11-01 DIAGNOSIS — R21 Rash and other nonspecific skin eruption: Secondary | ICD-10-CM | POA: Insufficient documentation

## 2013-11-01 NOTE — Assessment & Plan Note (Signed)
For derm referral,  to f/u any worsening symptoms or concerns

## 2013-11-01 NOTE — Assessment & Plan Note (Signed)
stable overall by history and exam, recent data reviewed with pt, and pt to continue medical treatment as before,  to f/u any worsening symptoms or concerns BP Readings from Last 3 Encounters:  10/27/13 132/64  08/04/13 92/60  07/28/13 150/90

## 2013-11-01 NOTE — Assessment & Plan Note (Signed)
stable overall by history and exam, recent data reviewed with pt, and pt to continue medical treatment as before,  to f/u any worsening symptoms or concerns Lab Results  Component Value Date   HGBA1C 7.3* 01/01/2013

## 2013-11-01 NOTE — Assessment & Plan Note (Signed)
Mild to mod, for antibx course,  to f/u any worsening symptoms or concerns 

## 2013-11-02 DIAGNOSIS — D631 Anemia in chronic kidney disease: Secondary | ICD-10-CM | POA: Diagnosis not present

## 2013-11-02 DIAGNOSIS — D509 Iron deficiency anemia, unspecified: Secondary | ICD-10-CM | POA: Diagnosis not present

## 2013-11-02 DIAGNOSIS — N039 Chronic nephritic syndrome with unspecified morphologic changes: Secondary | ICD-10-CM | POA: Diagnosis not present

## 2013-11-02 DIAGNOSIS — N186 End stage renal disease: Secondary | ICD-10-CM | POA: Diagnosis not present

## 2013-11-04 ENCOUNTER — Telehealth: Payer: Self-pay | Admitting: Internal Medicine

## 2013-11-04 DIAGNOSIS — N186 End stage renal disease: Secondary | ICD-10-CM | POA: Diagnosis not present

## 2013-11-04 DIAGNOSIS — D631 Anemia in chronic kidney disease: Secondary | ICD-10-CM | POA: Diagnosis not present

## 2013-11-04 DIAGNOSIS — D509 Iron deficiency anemia, unspecified: Secondary | ICD-10-CM | POA: Diagnosis not present

## 2013-11-04 DIAGNOSIS — I4891 Unspecified atrial fibrillation: Secondary | ICD-10-CM | POA: Diagnosis not present

## 2013-11-04 NOTE — Telephone Encounter (Signed)
Paper work is ready to be signed and on MD's desk

## 2013-11-04 NOTE — Telephone Encounter (Signed)
Tiffany @ [facility] called about a care plan regarding pt.  She has sent the request three times.  Last req sent 10/30/13.  Please call tiffany [phone].

## 2013-11-05 ENCOUNTER — Ambulatory Visit (INDEPENDENT_AMBULATORY_CARE_PROVIDER_SITE_OTHER): Payer: Medicare Other | Admitting: *Deleted

## 2013-11-05 ENCOUNTER — Encounter: Payer: Self-pay | Admitting: Family Medicine

## 2013-11-05 ENCOUNTER — Ambulatory Visit (INDEPENDENT_AMBULATORY_CARE_PROVIDER_SITE_OTHER): Payer: Medicare Other | Admitting: Family Medicine

## 2013-11-05 VITALS — BP 122/72 | HR 93 | Ht 69.0 in | Wt 271.0 lb

## 2013-11-05 DIAGNOSIS — I272 Pulmonary hypertension, unspecified: Secondary | ICD-10-CM

## 2013-11-05 DIAGNOSIS — Z5181 Encounter for therapeutic drug level monitoring: Secondary | ICD-10-CM

## 2013-11-05 DIAGNOSIS — I6350 Cerebral infarction due to unspecified occlusion or stenosis of unspecified cerebral artery: Secondary | ICD-10-CM

## 2013-11-05 DIAGNOSIS — M1710 Unilateral primary osteoarthritis, unspecified knee: Secondary | ICD-10-CM | POA: Diagnosis not present

## 2013-11-05 DIAGNOSIS — I2789 Other specified pulmonary heart diseases: Secondary | ICD-10-CM

## 2013-11-05 DIAGNOSIS — I639 Cerebral infarction, unspecified: Secondary | ICD-10-CM

## 2013-11-05 LAB — POCT INR: INR: 1.1

## 2013-11-05 MED ORDER — SIMVASTATIN 20 MG PO TABS: 20.0000 mg | ORAL_TABLET | Freq: Every day | ORAL | Status: DC

## 2013-11-05 MED ORDER — ALLOPURINOL 100 MG PO TABS: 100.0000 mg | ORAL_TABLET | Freq: Two times a day (BID) | ORAL | Status: DC

## 2013-11-05 MED ORDER — AMIODARONE HCL 200 MG PO TABS: 200.0000 mg | ORAL_TABLET | Freq: Every day | ORAL | Status: DC

## 2013-11-05 MED ORDER — WARFARIN SODIUM 3 MG PO TABS: 3.0000 mg | ORAL_TABLET | ORAL | Status: DC

## 2013-11-05 MED ORDER — SITAGLIPTIN PHOSPHATE 25 MG PO TABS: 25.0000 mg | ORAL_TABLET | Freq: Every day | ORAL | Status: DC

## 2013-11-05 NOTE — Progress Notes (Signed)
[name] [address], [place] [address] Phone: [phone] Subjective:     CC: Bilateral knee pain follow up  [id number] Daniel Whitney is a 58 y.o. male patient is coming back for his bilateral knee pain. He did have a series of 2 Synvisc injections in his knees. Patient unfortunately had a death in his family which caused him to not be able to followup. Patient is now doing better with the grieving process. Patient states that he continues to wear the brace is on his knees which has been helping. Patient did notice some improvement with the Synvisc injections. Last one that was greater than 3 months ago. The patient is here to finish the series.  Patient unfortunately fell and x-ray was concerning for potential loosening the patient's hip arthrosis. Patient was referred to orthopedic surgeon for further evaluation. Patient never went to the surgeon and states that his hip has improved.     Past medical history, social, surgical and family history all reviewed in electronic medical record.   Review of Systems: No headache, visual changes, nausea, vomiting, diarrhea, constipation, dizziness, abdominal pain, skin rash, fevers, chills, night sweats, weight loss, swollen lymph nodes, body aches, joint swelling, muscle aches, chest pain, shortness of breath, mood changes.   Objective Height 5\' 9"  (1.753 m), weight 271 lb (122.925 kg).  General: No apparent distress alert and oriented x3 mood and affect normal, dressed appropriately. Morbid obesity.  HEENT: Pupils equal, extraocular movements intact patient though does not respond well to light in the right eye Respiratory: Patient's speak in full sentences and does not appear short of breath  Cardiovascular: No lower extremity edema, non tender, no erythema  Skin: Warm dry intact with no signs of infection or rash on extremities or on axial skeleton.  Abdomen: Soft nontender  Neuro: Cranial nerves  II through XII are intact, neurovascularly intact in all extremities with 2+ DTRs and 2+ pulses.  Lymph: No lymphadenopathy of posterior or anterior cervical chain or axillae bilaterally.  Gait significant antalgic gait. MSK:  Non tender with full range of motion and good stability and symmetric strength and tone of shoulders, elbows, wrist,  and ankles bilaterally.   Procedure: Real-time Ultrasound Guided Injection of right knee Device: GE Logiq E  Ultrasound guided injection is preferred based studies that show increased duration, increased effect, greater accuracy, decreased procedural pain, increased response rate, and decreased cost with ultrasound guided versus blind injection.  Verbal informed consent obtained.  Time-out conducted.  Noted no overlying erythema, induration, or other signs of local infection.  Skin prepped in a sterile fashion.  Local anesthesia: Topical Ethyl chloride.  With sterile technique and under real time ultrasound guidance: 16 mg/2.5 mL of Synvisc (sodium hyaluronate) in a prefilled syringe was injected easily into the knee through a 22-gauge needle. Completed without difficulty  Pain immediately resolved suggesting accurate placement of the medication.  Advised to call if fevers/chills, erythema, induration, drainage, or persistent bleeding.  Images permanently stored and available for review in the ultrasound unit.  Impression: Technically successful ultrasound guided injection.  Procedure: Real-time Ultrasound Guided Injection of left knee Device: GE Logiq E  Ultrasound guided injection is preferred based studies that show increased duration, increased effect, greater accuracy, decreased procedural pain, increased response rate, and decreased cost with ultrasound guided versus blind injection.  Verbal informed consent obtained.  Time-out conducted.  Noted no overlying erythema, induration, or other signs of local infection.  Skin prepped in  a sterile  fashion.  Local anesthesia: Topical Ethyl chloride.  With sterile technique and under real time ultrasound guidance: 16 mg/2.5 mL of Synvisc (sodium hyaluronate) in a prefilled syringe was injected easily into the knee through a 22-gauge needle. Completed without difficulty  Pain immediately resolved suggesting accurate placement of the medication.  Advised to call if fevers/chills, erythema, induration, drainage, or persistent bleeding.  Images permanently stored and available for review in the ultrasound unit.  Impression: Technically successful ultrasound guided injection.     Impression and Recommendations:     This case required medical decision making of moderate complexity.

## 2013-11-05 NOTE — Assessment & Plan Note (Signed)
Patient finished his Synvisc series today. Patient continues conservative therapy including bracing, home exercises, icing and will come back and see me again in one month for further evaluation.

## 2013-11-05 NOTE — Patient Instructions (Signed)
It is great to see you Ice is your friend Continue the braces and see if that helps We finished the synvisc injections today and will see you in 1 month.  Sorry for your sister and I am here if you need me.

## 2013-11-06 DIAGNOSIS — N186 End stage renal disease: Secondary | ICD-10-CM | POA: Diagnosis not present

## 2013-11-06 DIAGNOSIS — D509 Iron deficiency anemia, unspecified: Secondary | ICD-10-CM | POA: Diagnosis not present

## 2013-11-06 DIAGNOSIS — D631 Anemia in chronic kidney disease: Secondary | ICD-10-CM | POA: Diagnosis not present

## 2013-11-09 ENCOUNTER — Telehealth: Payer: Self-pay

## 2013-11-09 DIAGNOSIS — D631 Anemia in chronic kidney disease: Secondary | ICD-10-CM | POA: Diagnosis not present

## 2013-11-09 DIAGNOSIS — D509 Iron deficiency anemia, unspecified: Secondary | ICD-10-CM | POA: Diagnosis not present

## 2013-11-09 DIAGNOSIS — N186 End stage renal disease: Secondary | ICD-10-CM | POA: Diagnosis not present

## 2013-11-10 ENCOUNTER — Encounter: Payer: Self-pay | Admitting: Cardiology

## 2013-11-10 MED ORDER — TRAMADOL HCL 50 MG PO TABS: 50.0000 mg | ORAL_TABLET | Freq: Three times a day (TID) | ORAL | Status: DC | PRN

## 2013-11-10 NOTE — Telephone Encounter (Signed)
Done hardcopy to robin  

## 2013-11-11 ENCOUNTER — Encounter: Payer: Self-pay | Admitting: Internal Medicine

## 2013-11-11 ENCOUNTER — Ambulatory Visit (INDEPENDENT_AMBULATORY_CARE_PROVIDER_SITE_OTHER): Payer: Medicare Other | Admitting: Internal Medicine

## 2013-11-11 VITALS — BP 114/72 | HR 87 | Temp 98.5°F | Wt 266.8 lb

## 2013-11-11 DIAGNOSIS — M109 Gout, unspecified: Secondary | ICD-10-CM | POA: Diagnosis not present

## 2013-11-11 DIAGNOSIS — H9190 Unspecified hearing loss, unspecified ear: Secondary | ICD-10-CM

## 2013-11-11 DIAGNOSIS — D631 Anemia in chronic kidney disease: Secondary | ICD-10-CM | POA: Diagnosis not present

## 2013-11-11 DIAGNOSIS — G894 Chronic pain syndrome: Secondary | ICD-10-CM

## 2013-11-11 DIAGNOSIS — H9191 Unspecified hearing loss, right ear: Secondary | ICD-10-CM

## 2013-11-11 DIAGNOSIS — N186 End stage renal disease: Secondary | ICD-10-CM | POA: Diagnosis not present

## 2013-11-11 DIAGNOSIS — M25511 Pain in right shoulder: Secondary | ICD-10-CM

## 2013-11-11 DIAGNOSIS — M25519 Pain in unspecified shoulder: Secondary | ICD-10-CM

## 2013-11-11 DIAGNOSIS — D509 Iron deficiency anemia, unspecified: Secondary | ICD-10-CM | POA: Diagnosis not present

## 2013-11-11 MED ORDER — TRAMADOL HCL ER 300 MG PO TB24: 300.0000 mg | ORAL_TABLET | Freq: Every day | ORAL | Status: DC

## 2013-11-11 MED ORDER — PREDNISONE 10 MG PO TABS: ORAL_TABLET | ORAL | Status: DC

## 2013-11-11 MED ORDER — METHYLPREDNISOLONE ACETATE 80 MG/ML IJ SUSP
80.0000 mg | Freq: Once | INTRAMUSCULAR | Status: AC
  Administered 2013-11-11: 80 mg via INTRAMUSCULAR

## 2013-11-11 MED ORDER — AMITRIPTYLINE HCL 50 MG PO TABS: 50.0000 mg | ORAL_TABLET | Freq: Every day | ORAL | Status: DC

## 2013-11-11 NOTE — Assessment & Plan Note (Signed)
Mild to mod, for depomedrol IM, predpac asd, to f/u any worsening symptoms or concerns 

## 2013-11-11 NOTE — Progress Notes (Signed)
Subjective:    Patient ID: Daniel Whitney, male    DOB: [birth year], 58 y.o.   MRN: [id number]  HPI  Here with 3 days onset acute gouty like pain/swelling to both first MTP's without fever, trauma.  Has hx of similar in past .  Has some decreased ROM to right shoulder. Asks for refill ultram for chronic gen'd DJD pain- now on 150 bid  Also with right hearing loss x > 1 wk, no fever or pain.  Needs elavil refill as well. Pt denies chest pain, increased sob or doe, wheezing, orthopnea, PND, increased LE swelling, palpitations, dizziness or syncope. Past Medical History  Diagnosis Date  . Arthritis   . Diabetes mellitus with neuropathy   . Glaucoma   . Hypertension   . ESRD (end stage renal disease) on dialysis   . Stroke   . Hyperlipidemia   . Atrial fibrillation 12/30/2012  . CHF (congestive heart failure) 12/30/2012  . Diabetic retinopathy 12/30/2012  . Morbid obesity 12/30/2012  . OSA (obstructive sleep apnea) 12/30/2012  . Pulmonary hypertension 12/30/2012   Past Surgical History  Procedure Laterality Date  . Total hip arthroplasty Right 1980  . Av fistula placement Left     reports that he has never smoked. He has never used smokeless tobacco. He reports that he drinks about 1.5 - 2 ounces of alcohol per week. He reports that he does not use illicit drugs. family history includes Arthritis in his other; Diabetes in his other; Heart disease in his other; Hyperlipidemia in his other; Hypertension in his other; Kidney disease in his other; Stroke in his other. No Known Allergies Current Outpatient Prescriptions on File Prior to Visit  Medication Sig Dispense Refill  . allopurinol (ZYLOPRIM) 100 MG tablet Take 1 tablet (100 mg total) by mouth 2 (two) times daily.  180 tablet  1  . amiodarone (PACERONE) 200 MG tablet Take 1 tablet (200 mg total) by mouth daily.  90 tablet  1  . b complex-vitamin c-folic acid (NEPHRO-VITE) 0.8 MG TABS tablet Take 1 tablet by mouth daily.      .  carvedilol (COREG) 25 MG tablet Take 25 mg by mouth 2 (two) times daily with a meal.      . ciprofloxacin (CIPRO) 250 MG tablet Take 1 tablet (250 mg total) by mouth 2 (two) times daily.  14 tablet  0  . clindamycin (CLEOCIN T) 1 % lotion Apply topically 2 (two) times daily.  60 mL  0  . Eflornithine HCl (VANIQA) 13.9 % cream Apply topically 2 (two) times daily with a meal.      . neomycin-polymyxin-hydrocortisone (CORTISPORIN) otic solution Place 4 drops into the right ear 4 (four) times daily.  10 mL  0  . ondansetron (ZOFRAN) 4 MG tablet Take 1 tablet (4 mg total) by mouth every 6 (six) hours.  12 tablet  0  . OVER THE COUNTER MEDICATION 2 (two) times daily. Benzaderm Wash      . OVER THE COUNTER MEDICATION 1 application 2 (two) times daily. Melaquin Complex      . sevelamer carbonate (RENVELA) 800 MG tablet Take 850 mg by mouth 3 (three) times daily with meals.       . simvastatin (ZOCOR) 20 MG tablet Take 1 tablet (20 mg total) by mouth daily.  90 tablet  1  . sitaGLIPtin (JANUVIA) 25 MG tablet Take 1 tablet (25 mg total) by mouth daily.  90 tablet  3  . tadalafil (CIALIS) 20 MG  tablet Take 1 tablet (20 mg total) by mouth daily as needed for erectile dysfunction.  5 tablet  11  . valsartan (DIOVAN) 40 MG tablet Take 40 mg by mouth 4 (four) times a week.       . warfarin (COUMADIN) 3 MG tablet Take 1-1.5 tablets (3-4.5 mg total) by mouth See admin instructions. Thursday 1.5 tablet and on the remaining days you take 1 tabletTake as directed by anticoagulation clinic  135 tablet  1   No current facility-administered medications on file prior to visit.    Review of Systems  Constitutional: Negative for unusual diaphoresis or other sweats  HENT: Negative for ringing in ear Eyes: Negative for double vision or worsening visual disturbance.  Respiratory: Negative for choking and stridor.   Gastrointestinal: Negative for vomiting or other signifcant bowel change Genitourinary: Negative for  hematuria or decreased urine volume.  Musculoskeletal: Negative for other MSK pain or swelling Skin: Negative for color change and worsening wound.  Neurological: Negative for tremors and numbness other than noted  Psychiatric/Behavioral: Negative for decreased concentration or agitation other than above       Objective:   Physical Exam BP 114/72  Pulse 87  Temp(Src) 98.5 F (36.9 C) (Oral)  Wt 266 lb 12 oz (120.997 kg)  SpO2 94% VS noted,  Constitutional: Pt appears well-developed, well-nourished.  HENT: Head: NCAT.  Right Ear: External ear normal.  Left Ear: External ear normal.  Bilat canals clear after right wax impaction irrigated, hearing improved Eyes: . Pupils are equal, round, and reactive to light. Conjunctivae and EOM are normal Neck: Normal range of motion. Neck supple.  Cardiovascular: Normal rate and regular rhythm.   Pulmonary/Chest: Effort normal and breath sounds normal.  Neurological: Pt is alert. Not confused , motor grossly intact Skin: bilat first MTP's with 1-2+ red/tender/sweling Psychiatric: Pt behavior is normal. No agitation.  No acute synovitis otherwise Right shoulder with decrease ROM byt 10 degress to abduction and forward elevated, nontender bursa    Assessment & Plan:

## 2013-11-11 NOTE — Assessment & Plan Note (Signed)
?   Rot cuff dz - for sport med referral

## 2013-11-11 NOTE — Progress Notes (Signed)
Pre visit review using our clinic review tool, if applicable. No additional management support is needed unless otherwise documented below in the visit note. 

## 2013-11-11 NOTE — Assessment & Plan Note (Signed)
[place] for change ultram to ER 300 qd prn

## 2013-11-11 NOTE — Telephone Encounter (Signed)
Faxed hardcopy for Tramadol to [facility]

## 2013-11-11 NOTE — Patient Instructions (Signed)
You had the steroid shot today  Please take all new medication as prescribed  - the prednisone  Your right ear was irrigated of wax today  OK to stop the ultram 50 mg  Please take all new medication as prescribed - the ultram 300 mg ER tablet - 1 per day  You will be contacted regarding the referral for: Dr Tamala Julian, for the right shoulder  Please continue all other medications as before, and refills have been done if requested  - the elavil to [facility] on [address]  Please have the pharmacy call with any other refills you may need.  Please continue your efforts at being more active, low cholesterol diet, and weight control.  Please keep your appointments with your specialists as you may have planned

## 2013-11-11 NOTE — Assessment & Plan Note (Signed)
Improved after wax impactions irrigated

## 2013-11-13 DIAGNOSIS — N186 End stage renal disease: Secondary | ICD-10-CM | POA: Diagnosis not present

## 2013-11-16 DIAGNOSIS — N186 End stage renal disease: Secondary | ICD-10-CM | POA: Diagnosis not present

## 2013-11-18 ENCOUNTER — Other Ambulatory Visit (INDEPENDENT_AMBULATORY_CARE_PROVIDER_SITE_OTHER): Payer: Medicare Other

## 2013-11-18 ENCOUNTER — Encounter: Payer: Self-pay | Admitting: Internal Medicine

## 2013-11-18 DIAGNOSIS — E1165 Type 2 diabetes mellitus with hyperglycemia: Secondary | ICD-10-CM | POA: Diagnosis not present

## 2013-11-18 DIAGNOSIS — N186 End stage renal disease: Secondary | ICD-10-CM | POA: Diagnosis not present

## 2013-11-18 DIAGNOSIS — IMO0002 Reserved for concepts with insufficient information to code with codable children: Secondary | ICD-10-CM

## 2013-11-18 LAB — HEMOGLOBIN A1C: Hgb A1c MFr Bld: 6.9 % — ABNORMAL HIGH (ref 4.6–6.5)

## 2013-11-20 ENCOUNTER — Telehealth: Payer: Self-pay | Admitting: Internal Medicine

## 2013-11-20 DIAGNOSIS — N186 End stage renal disease: Secondary | ICD-10-CM | POA: Diagnosis not present

## 2013-11-20 NOTE — Telephone Encounter (Signed)
Called the patient left detailed message that Tramadol was was Faxed to [facility] Dr. Letta Kocher on 11/11/13 #90 with 1 refill.  CALLED 2 TIMES NO ANSWER HAD TO LEAVE A DETAILED MESSAGE AND TO PLEASE CALL BACK IF NEED ANYTHING ELSE.

## 2013-11-20 NOTE — Telephone Encounter (Signed)
Pt request refill for Tramadol , pt stated [facility] on [address] is trying to get in touch with [place] about 4 times, never heard anything from out office, please check

## 2013-11-23 ENCOUNTER — Telehealth: Payer: Self-pay

## 2013-11-23 NOTE — Telephone Encounter (Signed)
Received refill request from pharmacy for tramadol hcl 50 mg, take 1 tab po q8hr prn, called pt to verify if this is the pharmacy he wants this rx filled at, because he has [facility], and [facility] listed as his pharmacies.  lmtcb

## 2013-12-11 DIAGNOSIS — N186 End stage renal disease: Secondary | ICD-10-CM | POA: Diagnosis not present

## 2013-12-12 DIAGNOSIS — N186 End stage renal disease: Secondary | ICD-10-CM | POA: Diagnosis not present

## 2013-12-12 DIAGNOSIS — Z992 Dependence on renal dialysis: Secondary | ICD-10-CM | POA: Diagnosis not present

## 2013-12-23 DIAGNOSIS — N186 End stage renal disease: Secondary | ICD-10-CM | POA: Diagnosis not present

## 2013-12-23 DIAGNOSIS — D631 Anemia in chronic kidney disease: Secondary | ICD-10-CM | POA: Diagnosis not present

## 2013-12-23 DIAGNOSIS — Z992 Dependence on renal dialysis: Secondary | ICD-10-CM | POA: Diagnosis not present

## 2013-12-25 DIAGNOSIS — N186 End stage renal disease: Secondary | ICD-10-CM | POA: Diagnosis not present

## 2013-12-25 DIAGNOSIS — D631 Anemia in chronic kidney disease: Secondary | ICD-10-CM | POA: Diagnosis not present

## 2013-12-25 DIAGNOSIS — Z992 Dependence on renal dialysis: Secondary | ICD-10-CM | POA: Diagnosis not present

## 2013-12-28 DIAGNOSIS — D631 Anemia in chronic kidney disease: Secondary | ICD-10-CM | POA: Diagnosis not present

## 2013-12-28 DIAGNOSIS — N186 End stage renal disease: Secondary | ICD-10-CM | POA: Diagnosis not present

## 2013-12-28 DIAGNOSIS — Z992 Dependence on renal dialysis: Secondary | ICD-10-CM | POA: Diagnosis not present

## 2013-12-30 DIAGNOSIS — Z992 Dependence on renal dialysis: Secondary | ICD-10-CM | POA: Diagnosis not present

## 2013-12-30 DIAGNOSIS — N186 End stage renal disease: Secondary | ICD-10-CM | POA: Diagnosis not present

## 2013-12-30 DIAGNOSIS — D631 Anemia in chronic kidney disease: Secondary | ICD-10-CM | POA: Diagnosis not present

## 2014-01-01 DIAGNOSIS — D631 Anemia in chronic kidney disease: Secondary | ICD-10-CM | POA: Diagnosis not present

## 2014-01-01 DIAGNOSIS — N186 End stage renal disease: Secondary | ICD-10-CM | POA: Diagnosis not present

## 2014-01-01 DIAGNOSIS — Z23 Encounter for immunization: Secondary | ICD-10-CM | POA: Diagnosis not present

## 2014-01-03 DIAGNOSIS — N186 End stage renal disease: Secondary | ICD-10-CM | POA: Diagnosis not present

## 2014-01-03 DIAGNOSIS — D631 Anemia in chronic kidney disease: Secondary | ICD-10-CM | POA: Diagnosis not present

## 2014-01-03 DIAGNOSIS — Z23 Encounter for immunization: Secondary | ICD-10-CM | POA: Diagnosis not present

## 2014-01-05 DIAGNOSIS — I4891 Unspecified atrial fibrillation: Secondary | ICD-10-CM | POA: Diagnosis not present

## 2014-01-05 DIAGNOSIS — D631 Anemia in chronic kidney disease: Secondary | ICD-10-CM | POA: Diagnosis not present

## 2014-01-05 DIAGNOSIS — N186 End stage renal disease: Secondary | ICD-10-CM | POA: Diagnosis not present

## 2014-01-05 DIAGNOSIS — Z23 Encounter for immunization: Secondary | ICD-10-CM | POA: Diagnosis not present

## 2014-01-08 DIAGNOSIS — Z23 Encounter for immunization: Secondary | ICD-10-CM | POA: Diagnosis not present

## 2014-01-08 DIAGNOSIS — D631 Anemia in chronic kidney disease: Secondary | ICD-10-CM | POA: Diagnosis not present

## 2014-01-08 DIAGNOSIS — N186 End stage renal disease: Secondary | ICD-10-CM | POA: Diagnosis not present

## 2014-01-11 ENCOUNTER — Ambulatory Visit: Payer: Self-pay | Admitting: Certified Registered Nurse Anesthetist

## 2014-01-11 DIAGNOSIS — Z5181 Encounter for therapeutic drug level monitoring: Secondary | ICD-10-CM

## 2014-01-11 DIAGNOSIS — D631 Anemia in chronic kidney disease: Secondary | ICD-10-CM | POA: Diagnosis not present

## 2014-01-11 DIAGNOSIS — I272 Pulmonary hypertension, unspecified: Secondary | ICD-10-CM

## 2014-01-11 DIAGNOSIS — I639 Cerebral infarction, unspecified: Secondary | ICD-10-CM

## 2014-01-11 DIAGNOSIS — N186 End stage renal disease: Secondary | ICD-10-CM | POA: Diagnosis not present

## 2014-01-11 DIAGNOSIS — Z23 Encounter for immunization: Secondary | ICD-10-CM | POA: Diagnosis not present

## 2014-01-11 LAB — POCT INR: INR: 2.79

## 2014-01-11 NOTE — Patient Instructions (Addendum)
01/11/14-Attempted to call x2.  Unable to reach patient or voicemail.  Current INR within goal range.  01-12-14- Reached patient.  Discussed current dosing and coumadin management.  No changes made at this time.  Encouraged to call back with questions.

## 2014-01-13 DIAGNOSIS — D631 Anemia in chronic kidney disease: Secondary | ICD-10-CM | POA: Diagnosis not present

## 2014-01-13 DIAGNOSIS — N2581 Secondary hyperparathyroidism of renal origin: Secondary | ICD-10-CM | POA: Diagnosis not present

## 2014-01-13 DIAGNOSIS — N186 End stage renal disease: Secondary | ICD-10-CM | POA: Diagnosis not present

## 2014-01-14 DIAGNOSIS — L0202 Furuncle of face: Secondary | ICD-10-CM | POA: Diagnosis not present

## 2014-01-14 DIAGNOSIS — B958 Unspecified staphylococcus as the cause of diseases classified elsewhere: Secondary | ICD-10-CM | POA: Diagnosis not present

## 2014-01-21 ENCOUNTER — Ambulatory Visit: Payer: Medicare Other | Admitting: Family Medicine

## 2014-01-21 DIAGNOSIS — N2581 Secondary hyperparathyroidism of renal origin: Secondary | ICD-10-CM | POA: Diagnosis not present

## 2014-01-21 DIAGNOSIS — N186 End stage renal disease: Secondary | ICD-10-CM | POA: Diagnosis not present

## 2014-01-23 DIAGNOSIS — N186 End stage renal disease: Secondary | ICD-10-CM | POA: Diagnosis not present

## 2014-01-23 DIAGNOSIS — N2581 Secondary hyperparathyroidism of renal origin: Secondary | ICD-10-CM | POA: Diagnosis not present

## 2014-01-29 ENCOUNTER — Encounter (HOSPITAL_COMMUNITY): Payer: Self-pay | Admitting: *Deleted

## 2014-01-29 ENCOUNTER — Emergency Department (HOSPITAL_COMMUNITY): Payer: Medicare Other

## 2014-01-29 ENCOUNTER — Inpatient Hospital Stay (HOSPITAL_COMMUNITY)
Admission: EM | Admit: 2014-01-29 | Discharge: 2014-01-31 | DRG: 202 | Disposition: A | Discharge status: Home / Self Care | Payer: Medicare Other | Attending: Internal Medicine | Admitting: Internal Medicine

## 2014-01-29 DIAGNOSIS — Z6839 Body mass index (BMI) 39.0-39.9, adult: Secondary | ICD-10-CM

## 2014-01-29 DIAGNOSIS — N186 End stage renal disease: Secondary | ICD-10-CM | POA: Diagnosis present

## 2014-01-29 DIAGNOSIS — I4891 Unspecified atrial fibrillation: Secondary | ICD-10-CM | POA: Diagnosis present

## 2014-01-29 DIAGNOSIS — R05 Cough: Secondary | ICD-10-CM | POA: Diagnosis not present

## 2014-01-29 DIAGNOSIS — I252 Old myocardial infarction: Secondary | ICD-10-CM | POA: Diagnosis not present

## 2014-01-29 DIAGNOSIS — Z8673 Personal history of transient ischemic attack (TIA), and cerebral infarction without residual deficits: Secondary | ICD-10-CM | POA: Diagnosis not present

## 2014-01-29 DIAGNOSIS — I5032 Chronic diastolic (congestive) heart failure: Secondary | ICD-10-CM | POA: Diagnosis present

## 2014-01-29 DIAGNOSIS — J209 Acute bronchitis, unspecified: Secondary | ICD-10-CM | POA: Diagnosis not present

## 2014-01-29 DIAGNOSIS — Z7901 Long term (current) use of anticoagulants: Secondary | ICD-10-CM

## 2014-01-29 DIAGNOSIS — N2581 Secondary hyperparathyroidism of renal origin: Secondary | ICD-10-CM | POA: Diagnosis present

## 2014-01-29 DIAGNOSIS — M109 Gout, unspecified: Secondary | ICD-10-CM | POA: Diagnosis present

## 2014-01-29 DIAGNOSIS — E11319 Type 2 diabetes mellitus with unspecified diabetic retinopathy without macular edema: Secondary | ICD-10-CM | POA: Diagnosis present

## 2014-01-29 DIAGNOSIS — G4733 Obstructive sleep apnea (adult) (pediatric): Secondary | ICD-10-CM | POA: Diagnosis present

## 2014-01-29 DIAGNOSIS — D649 Anemia, unspecified: Secondary | ICD-10-CM | POA: Diagnosis present

## 2014-01-29 DIAGNOSIS — R0902 Hypoxemia: Secondary | ICD-10-CM

## 2014-01-29 DIAGNOSIS — J45902 Unspecified asthma with status asthmaticus: Secondary | ICD-10-CM | POA: Diagnosis not present

## 2014-01-29 DIAGNOSIS — Z531 Procedure and treatment not carried out because of patient's decision for reasons of belief and group pressure: Secondary | ICD-10-CM | POA: Diagnosis present

## 2014-01-29 DIAGNOSIS — J208 Acute bronchitis due to other specified organisms: Principal | ICD-10-CM | POA: Diagnosis present

## 2014-01-29 DIAGNOSIS — E1340 Other specified diabetes mellitus with diabetic neuropathy, unspecified: Secondary | ICD-10-CM

## 2014-01-29 DIAGNOSIS — K219 Gastro-esophageal reflux disease without esophagitis: Secondary | ICD-10-CM | POA: Diagnosis present

## 2014-01-29 DIAGNOSIS — Z992 Dependence on renal dialysis: Secondary | ICD-10-CM

## 2014-01-29 DIAGNOSIS — I482 Chronic atrial fibrillation, unspecified: Secondary | ICD-10-CM

## 2014-01-29 DIAGNOSIS — I272 Other secondary pulmonary hypertension: Secondary | ICD-10-CM | POA: Diagnosis present

## 2014-01-29 DIAGNOSIS — R0602 Shortness of breath: Secondary | ICD-10-CM | POA: Diagnosis not present

## 2014-01-29 DIAGNOSIS — E785 Hyperlipidemia, unspecified: Secondary | ICD-10-CM | POA: Diagnosis present

## 2014-01-29 DIAGNOSIS — J9601 Acute respiratory failure with hypoxia: Secondary | ICD-10-CM | POA: Diagnosis not present

## 2014-01-29 DIAGNOSIS — M1990 Unspecified osteoarthritis, unspecified site: Secondary | ICD-10-CM | POA: Diagnosis present

## 2014-01-29 DIAGNOSIS — E8770 Fluid overload, unspecified: Secondary | ICD-10-CM | POA: Diagnosis not present

## 2014-01-29 DIAGNOSIS — J9811 Atelectasis: Secondary | ICD-10-CM | POA: Diagnosis not present

## 2014-01-29 DIAGNOSIS — E871 Hypo-osmolality and hyponatremia: Secondary | ICD-10-CM | POA: Diagnosis present

## 2014-01-29 DIAGNOSIS — I10 Essential (primary) hypertension: Secondary | ICD-10-CM | POA: Diagnosis not present

## 2014-01-29 DIAGNOSIS — I120 Hypertensive chronic kidney disease with stage 5 chronic kidney disease or end stage renal disease: Secondary | ICD-10-CM | POA: Diagnosis present

## 2014-01-29 DIAGNOSIS — G8929 Other chronic pain: Secondary | ICD-10-CM | POA: Diagnosis present

## 2014-01-29 DIAGNOSIS — E1140 Type 2 diabetes mellitus with diabetic neuropathy, unspecified: Secondary | ICD-10-CM | POA: Diagnosis present

## 2014-01-29 DIAGNOSIS — Z96641 Presence of right artificial hip joint: Secondary | ICD-10-CM | POA: Diagnosis present

## 2014-01-29 DIAGNOSIS — J811 Chronic pulmonary edema: Secondary | ICD-10-CM | POA: Diagnosis not present

## 2014-01-29 DIAGNOSIS — N19 Unspecified kidney failure: Secondary | ICD-10-CM

## 2014-01-29 DIAGNOSIS — I480 Paroxysmal atrial fibrillation: Secondary | ICD-10-CM | POA: Diagnosis not present

## 2014-01-29 HISTORY — DX: Acute myocardial infarction, unspecified: I21.9

## 2014-01-29 HISTORY — DX: Low back pain: M54.5

## 2014-01-29 HISTORY — DX: Other chronic pain: G89.29

## 2014-01-29 HISTORY — DX: Procedure and treatment not carried out because of patient's decision for reasons of belief and group pressure: Z53.1

## 2014-01-29 HISTORY — DX: Low back pain, unspecified: M54.50

## 2014-01-29 HISTORY — DX: Personal history of other diseases of the musculoskeletal system and connective tissue: Z87.39

## 2014-01-29 HISTORY — DX: Reserved for inherently not codable concepts without codable children: IMO0001

## 2014-01-29 HISTORY — DX: Gastro-esophageal reflux disease without esophagitis: K21.9

## 2014-01-29 LAB — BASIC METABOLIC PANEL
ANION GAP: 14 (ref 5–15)
BUN: 36 mg/dL — AB (ref 6–23)
CHLORIDE: 85 meq/L — AB (ref 96–112)
CO2: 30 mEq/L (ref 19–32)
Calcium: 9.7 mg/dL (ref 8.4–10.5)
Creatinine, Ser: 5.7 mg/dL — ABNORMAL HIGH (ref 0.50–1.35)
GFR calc non Af Amer: 10 mL/min — ABNORMAL LOW (ref >90)
GFR, EST AFRICAN AMERICAN: 11 mL/min — AB (ref >90)
Glucose, Bld: 141 mg/dL — ABNORMAL HIGH (ref 70–99)
Potassium: 3.8 mEq/L (ref 3.7–5.3)
SODIUM: 129 meq/L — AB (ref 137–147)

## 2014-01-29 LAB — CBC WITH DIFFERENTIAL/PLATELET
Basophils Absolute: 0 10*3/uL (ref 0.0–0.1)
Basophils Relative: 0 % (ref 0–1)
Eosinophils Absolute: 0.1 10*3/uL (ref 0.0–0.7)
Eosinophils Relative: 1 % (ref 0–5)
HCT: 32 % — ABNORMAL LOW (ref 39.0–52.0)
HEMOGLOBIN: 10 g/dL — AB (ref 13.0–17.0)
LYMPHS ABS: 1.7 10*3/uL (ref 0.7–4.0)
Lymphocytes Relative: 15 % (ref 12–46)
MCH: 30.5 pg (ref 26.0–34.0)
MCHC: 31.3 g/dL (ref 30.0–36.0)
MCV: 97.6 fL (ref 78.0–100.0)
Monocytes Absolute: 1.1 10*3/uL — ABNORMAL HIGH (ref 0.1–1.0)
Monocytes Relative: 10 % (ref 3–12)
NEUTROS ABS: 8.1 10*3/uL — AB (ref 1.7–7.7)
NEUTROS PCT: 74 % (ref 43–77)
Platelets: 219 10*3/uL (ref 150–400)
RBC: 3.28 MIL/uL — AB (ref 4.22–5.81)
RDW: 14.4 % (ref 11.5–15.5)
WBC: 11 10*3/uL — AB (ref 4.0–10.5)

## 2014-01-29 LAB — I-STAT TROPONIN, ED: TROPONIN I, POC: 0.01 ng/mL (ref 0.00–0.08)

## 2014-01-29 LAB — CBC
HCT: 31.4 % — ABNORMAL LOW (ref 39.0–52.0)
HEMOGLOBIN: 9.9 g/dL — AB (ref 13.0–17.0)
MCH: 30.8 pg (ref 26.0–34.0)
MCHC: 31.5 g/dL (ref 30.0–36.0)
MCV: 97.8 fL (ref 78.0–100.0)
Platelets: 236 10*3/uL (ref 150–400)
RBC: 3.21 MIL/uL — AB (ref 4.22–5.81)
RDW: 14.6 % (ref 11.5–15.5)
WBC: 11.8 10*3/uL — ABNORMAL HIGH (ref 4.0–10.5)

## 2014-01-29 LAB — CREATININE, SERUM
CREATININE: 5.92 mg/dL — AB (ref 0.50–1.35)
GFR calc Af Amer: 11 mL/min — ABNORMAL LOW (ref >90)
GFR calc non Af Amer: 9 mL/min — ABNORMAL LOW (ref >90)

## 2014-01-29 LAB — TROPONIN I: Troponin I: <0.3 ng/mL (ref <0.30)

## 2014-01-29 LAB — MAGNESIUM: Magnesium: 1.9 mg/dL (ref 1.5–2.5)

## 2014-01-29 LAB — HEPATIC FUNCTION PANEL
ALT: 14 U/L (ref 0–53)
AST: 19 U/L (ref 0–37)
Albumin: 3 g/dL — ABNORMAL LOW (ref 3.5–5.2)
Alkaline Phosphatase: 137 U/L — ABNORMAL HIGH (ref 39–117)
Bilirubin, Direct: <0.2 mg/dL (ref 0.0–0.3)
Total Bilirubin: 0.3 mg/dL (ref 0.3–1.2)
Total Protein: 7.4 g/dL (ref 6.0–8.3)

## 2014-01-29 LAB — PROTIME-INR
INR: 2.74 — AB (ref 0.00–1.49)
PROTHROMBIN TIME: 29.2 s — AB (ref 11.6–15.2)

## 2014-01-29 LAB — HEMOGLOBIN A1C
Hgb A1c MFr Bld: 6.5 % — ABNORMAL HIGH
Mean Plasma Glucose: 140 mg/dL — ABNORMAL HIGH

## 2014-01-29 LAB — MRSA PCR SCREENING: MRSA by PCR: NEGATIVE

## 2014-01-29 LAB — PRO B NATRIURETIC PEPTIDE: PRO B NATRI PEPTIDE: 7824 pg/mL — AB (ref 0–125)

## 2014-01-29 LAB — CBG MONITORING, ED: Glucose-Capillary: 146 mg/dL — ABNORMAL HIGH (ref 70–99)

## 2014-01-29 MED ORDER — CARVEDILOL 25 MG PO TABS
25.0000 mg | ORAL_TABLET | Freq: Two times a day (BID) | ORAL | Status: DC
  Administered 2014-01-30 – 2014-01-31 (×2): 25 mg via ORAL
  Filled 2014-01-29 (×6): qty 1

## 2014-01-29 MED ORDER — LEVALBUTEROL HCL 0.63 MG/3ML IN NEBU: 0.6300 mg | INHALATION_SOLUTION | Freq: Four times a day (QID) | RESPIRATORY_TRACT | Status: DC | PRN

## 2014-01-29 MED ORDER — LIDOCAINE-PRILOCAINE 2.5-2.5 % EX CREA
1.0000 "application " | TOPICAL_CREAM | CUTANEOUS | Status: DC | PRN
  Filled 2014-01-29: qty 5

## 2014-01-29 MED ORDER — WARFARIN SODIUM 3 MG PO TABS
3.0000 mg | ORAL_TABLET | Freq: Once | ORAL | Status: DC
  Filled 2014-01-29: qty 1

## 2014-01-29 MED ORDER — NEPRO/CARBSTEADY PO LIQD
237.0000 mL | ORAL | Status: DC | PRN
  Filled 2014-01-29: qty 237

## 2014-01-29 MED ORDER — NEOMYCIN-POLYMYXIN-HC 3.5-10000-1 OT SOLN
4.0000 [drp] | Freq: Four times a day (QID) | OTIC | Status: DC
  Filled 2014-01-29: qty 10

## 2014-01-29 MED ORDER — ALBUTEROL SULFATE (2.5 MG/3ML) 0.083% IN NEBU
5.0000 mg | INHALATION_SOLUTION | Freq: Once | RESPIRATORY_TRACT | Status: AC
  Administered 2014-01-29: 5 mg via RESPIRATORY_TRACT
  Filled 2014-01-29: qty 6

## 2014-01-29 MED ORDER — AMITRIPTYLINE HCL 50 MG PO TABS
50.0000 mg | ORAL_TABLET | Freq: Every day | ORAL | Status: DC
  Administered 2014-01-29 – 2014-01-30 (×2): 50 mg via ORAL
  Filled 2014-01-29 (×3): qty 1

## 2014-01-29 MED ORDER — ACETAMINOPHEN 325 MG PO TABS: 650.0000 mg | ORAL_TABLET | Freq: Four times a day (QID) | ORAL | Status: DC | PRN

## 2014-01-29 MED ORDER — SODIUM CHLORIDE 0.9 % IV SOLN: 100.0000 mL | INTRAVENOUS | Status: DC | PRN

## 2014-01-29 MED ORDER — LIDOCAINE HCL (PF) 1 % IJ SOLN: 5.0000 mL | INTRAMUSCULAR | Status: DC | PRN

## 2014-01-29 MED ORDER — DOCUSATE SODIUM 100 MG PO CAPS
100.0000 mg | ORAL_CAPSULE | Freq: Two times a day (BID) | ORAL | Status: DC
  Administered 2014-01-29 – 2014-01-31 (×4): 100 mg via ORAL
  Filled 2014-01-29 (×6): qty 1

## 2014-01-29 MED ORDER — ALLOPURINOL 100 MG PO TABS
100.0000 mg | ORAL_TABLET | Freq: Two times a day (BID) | ORAL | Status: DC
  Administered 2014-01-29 – 2014-01-31 (×4): 100 mg via ORAL
  Filled 2014-01-29 (×5): qty 1

## 2014-01-29 MED ORDER — HYDROMORPHONE HCL 1 MG/ML IJ SOLN: 0.5000 mg | INTRAMUSCULAR | Status: DC | PRN

## 2014-01-29 MED ORDER — SODIUM CHLORIDE 0.9 % IV SOLN: 250.0000 mL | INTRAVENOUS | Status: DC | PRN

## 2014-01-29 MED ORDER — ONDANSETRON HCL 4 MG PO TABS: 4.0000 mg | ORAL_TABLET | Freq: Four times a day (QID) | ORAL | Status: DC | PRN

## 2014-01-29 MED ORDER — WARFARIN - PHARMACIST DOSING INPATIENT
Freq: Every day | Status: DC
  Administered 2014-01-31: 18:00:00

## 2014-01-29 MED ORDER — SODIUM CHLORIDE 0.9 % IJ SOLN
3.0000 mL | Freq: Two times a day (BID) | INTRAMUSCULAR | Status: DC
  Administered 2014-01-29 – 2014-01-31 (×4): 3 mL via INTRAVENOUS

## 2014-01-29 MED ORDER — HEPARIN SODIUM (PORCINE) 5000 UNIT/ML IJ SOLN
5000.0000 [IU] | Freq: Three times a day (TID) | INTRAMUSCULAR | Status: DC
  Administered 2014-01-29: 5000 [IU] via SUBCUTANEOUS
  Filled 2014-01-29 (×7): qty 1

## 2014-01-29 MED ORDER — IRBESARTAN 75 MG PO TABS
75.0000 mg | ORAL_TABLET | ORAL | Status: DC
  Administered 2014-01-31: 75 mg via ORAL
  Filled 2014-01-29 (×2): qty 1

## 2014-01-29 MED ORDER — SIMVASTATIN 20 MG PO TABS
20.0000 mg | ORAL_TABLET | Freq: Every day | ORAL | Status: DC
  Administered 2014-01-29 – 2014-01-31 (×3): 20 mg via ORAL
  Filled 2014-01-29 (×3): qty 1

## 2014-01-29 MED ORDER — PENTAFLUOROPROP-TETRAFLUOROETH EX AERO: 1.0000 "application " | INHALATION_SPRAY | CUTANEOUS | Status: DC | PRN

## 2014-01-29 MED ORDER — PREDNISONE 20 MG PO TABS
60.0000 mg | ORAL_TABLET | Freq: Once | ORAL | Status: AC
Start: 2014-01-29 — End: 2014-01-29
  Administered 2014-01-29: 60 mg via ORAL
  Filled 2014-01-29: qty 3

## 2014-01-29 MED ORDER — WARFARIN SODIUM 3 MG PO TABS
3.0000 mg | ORAL_TABLET | ORAL | Status: DC
  Administered 2014-01-29: 3 mg via ORAL
  Filled 2014-01-29 (×3): qty 1

## 2014-01-29 MED ORDER — ONDANSETRON HCL 4 MG/2ML IJ SOLN: 4.0000 mg | Freq: Four times a day (QID) | INTRAMUSCULAR | Status: DC | PRN

## 2014-01-29 MED ORDER — HEPARIN SODIUM (PORCINE) 1000 UNIT/ML DIALYSIS: 1000.0000 [IU] | INTRAMUSCULAR | Status: DC | PRN

## 2014-01-29 MED ORDER — ACETAMINOPHEN 325 MG PO TABS
650.0000 mg | ORAL_TABLET | Freq: Once | ORAL | Status: AC
  Administered 2014-01-29: 650 mg via ORAL
  Filled 2014-01-29: qty 2

## 2014-01-29 MED ORDER — SODIUM CHLORIDE 0.9 % IJ SOLN: 3.0000 mL | Freq: Two times a day (BID) | INTRAMUSCULAR | Status: DC

## 2014-01-29 MED ORDER — WARFARIN SODIUM 4 MG PO TABS: 4.5000 mg | ORAL_TABLET | ORAL | Status: DC

## 2014-01-29 MED ORDER — SODIUM CHLORIDE 0.9 % IJ SOLN: 3.0000 mL | INTRAMUSCULAR | Status: DC | PRN

## 2014-01-29 MED ORDER — IPRATROPIUM-ALBUTEROL 0.5-2.5 (3) MG/3ML IN SOLN
3.0000 mL | Freq: Once | RESPIRATORY_TRACT | Status: AC
  Administered 2014-01-29: 3 mL via RESPIRATORY_TRACT
  Filled 2014-01-29: qty 3

## 2014-01-29 MED ORDER — SEVELAMER CARBONATE 800 MG PO TABS
1600.0000 mg | ORAL_TABLET | Freq: Three times a day (TID) | ORAL | Status: DC
  Administered 2014-01-30 – 2014-01-31 (×5): 1600 mg via ORAL
  Filled 2014-01-29 (×7): qty 2

## 2014-01-29 MED ORDER — AMIODARONE HCL 200 MG PO TABS
200.0000 mg | ORAL_TABLET | Freq: Every day | ORAL | Status: DC
  Administered 2014-01-29 – 2014-01-31 (×2): 200 mg via ORAL
  Filled 2014-01-29 (×3): qty 1

## 2014-01-29 MED ORDER — ACETAMINOPHEN 650 MG RE SUPP: 650.0000 mg | Freq: Four times a day (QID) | RECTAL | Status: DC | PRN

## 2014-01-29 MED ORDER — ALTEPLASE 2 MG IJ SOLR
2.0000 mg | Freq: Once | INTRAMUSCULAR | Status: DC | PRN
  Filled 2014-01-29: qty 2

## 2014-01-29 MED ORDER — LINAGLIPTIN 5 MG PO TABS
5.0000 mg | ORAL_TABLET | Freq: Every day | ORAL | Status: DC
  Administered 2014-01-30: 5 mg via ORAL
  Filled 2014-01-29: qty 1

## 2014-01-29 NOTE — ED Notes (Signed)
Dr. Allyson Sabal evaluated patient and wrote admitting orders.  Patient sitting up in chair at bedside eating and drinking.

## 2014-01-29 NOTE — ED Provider Notes (Signed)
Just before transfer pt appeared more drowsy He is easily arousable and taking PO He is in no distress Will defer ABG until transfer  BP 116/69 mmHg  Pulse 55  Temp(Src) 97.8 F (36.6 C) (Oral)  Resp 25  Ht 5\' 9"  (1.753 m)  Wt 269 lb 13.5 oz (122.4 kg)  BMI 39.83 kg/m2  SpO2 98%   Sharyon Cable, MD 01/29/14 1346

## 2014-01-29 NOTE — ED Notes (Signed)
Pt reports hx of diaylsis and a fib. Pt is suppose to get dialysis today. Reports non productive cough x2 weeks. Increased SOB x2 days. Denies chest pain. Able to speak in short sentences. Denies n/v/d. Chronic back pain 7/10.

## 2014-01-29 NOTE — ED Notes (Signed)
Patient presents from home where he lives with family with complaints of productive cough, increasing SOB and fever x1 week.  Patient is a dialysis patient and is scheduled for an 11:30 am treatment today.  Patient denies chest pain and N/V/D.  On exam, patient has expiratory and inspiratory wheezing in all lung fields with increased WOB.  Abdomen is pendulous with normoactive bowel sounds.  Heart sounds are difficult to assess given patient's body habitus.  Patient has +2 radial and dorsalis pedis pulses with no edema noted in extremities.  Patient patient has fistula in place in left forearm.

## 2014-01-29 NOTE — Procedures (Signed)
I was present at this dialysis session, have reviewed the session itself and made  appropriate changes  Kelly Splinter MD (pgr) [phone]    ([phone] 01/29/2014, 3:36 PM

## 2014-01-29 NOTE — Progress Notes (Signed)
ANTICOAGULATION CONSULT NOTE - Initial Consult  Pharmacy Consult for Warfarin Indication: Atrial fibrillation  No Known Allergies  Patient Measurements: Height: 5\' 9"  (175.3 cm) Weight: 268 lb (121.564 kg) IBW/kg (Calculated) : 70.7  Vital Signs: Temp: 99.3 F (37.4 C) (12/18 0952) Temp Source: Oral (12/18 0952) BP: 114/88 mmHg (12/18 1231) Pulse Rate: 70 (12/18 1231)  Labs:  Recent Labs  01/29/14 0747 01/29/14 1059  HGB 10.0* 9.9*  HCT 32.0* 31.4*  PLT 219 236  LABPROT 29.2*  --   INR 2.74*  --   CREATININE 5.70* 5.92*  TROPONINI  --  <0.30    Estimated Creatinine Clearance: 17.3 mL/min (by C-G formula based on Cr of 5.92).   Medical History: Past Medical History  Diagnosis Date  . Arthritis   . Diabetes mellitus with neuropathy   . Glaucoma   . Hypertension   . ESRD (end stage renal disease) on dialysis   . Stroke   . Hyperlipidemia   . Atrial fibrillation 12/30/2012  . CHF (congestive heart failure) 12/30/2012  . Diabetic retinopathy 12/30/2012  . Morbid obesity 12/30/2012  . OSA (obstructive sleep apnea) 12/30/2012  . Pulmonary hypertension 12/30/2012    Medications:   (Not in a hospital admission) Scheduled:  . docusate sodium  100 mg Oral BID  . heparin  5,000 Units Subcutaneous 3 times per day  . sodium chloride  3 mL Intravenous Q12H  . sodium chloride  3 mL Intravenous Q12H   Infusions:  . sodium chloride     PRN: sodium chloride, acetaminophen **OR** acetaminophen, HYDROmorphone (DILAUDID) injection, levalbuterol, ondansetron **OR** ondansetron (ZOFRAN) IV, sodium chloride  Assessment: 58 year old male with ESRD on hemodialysis MWF, atrial fibrillation, dyslipidemia, diabetes who [facility] shortness of breath worsening over last few days.  Patient reports that he has been in contact with his grandchild who has had a viral infection this week. CXR shows pulmonary vascular congestion without pulmonary edema.  Plan is to  transfer to [facility].  Pharmacy is consulted to continue dosing warfarin upon admission.  Home dose is reported as 3mg  daily except 4.5mg  on Thursdays - confirmed with [facility] clinic notes from 01/11/14.  INR therapeutic today at 2.74.  Goal of Therapy:  INR 2-3 Monitor platelets by anticoagulation protocol: Yes   Plan:   Warfarin 3mg  PO x 1 at 1800 tonight  Daily PT/INR  Monitor for signs/symptoms of bleeding  Peggyann Juba, PharmD, BCPS Pager: [phone] 01/29/2014,12:41 PM

## 2014-01-29 NOTE — ED Notes (Signed)
Patient's sats dropped to 88% on RA with ambulation.

## 2014-01-29 NOTE — Consult Note (Signed)
Renal Service Consult Note [facility]  Daniel Whitney 01/29/2014 Daniel Whitney Requesting Physician:  Dr Allyson Sabal  Reason for Consult: ESRD patient with SOB HPI: The patient is a 58 y.o. year-old with hx of DM2, obesity, OSA, HTN, CVA, afib and ESRD on HD x 2 years, presented to ED today with SOB.  +slightly prod cough for 2 weeks, SOB x 2 days. No CP. Speaking in short sentences.  No n/v/Whitney.  SaO2 88% in ED on RA.  Had cpap at home for OSA about 5 years ago buy says he lost wt and hasn't needed it lately.  Chronic back pain.  No home O2.  CXR showed vasc congestion, no pulm edema.    ROS  no jt pain  no HA   no confusion  no diarrhea  Past Medical History  Past Medical History  Diagnosis Date  . Arthritis   . Diabetes mellitus with neuropathy   . Glaucoma   . Hypertension   . ESRD (end stage renal disease) on dialysis   . Stroke   . Hyperlipidemia   . Atrial fibrillation 12/30/2012  . CHF (congestive heart failure) 12/30/2012  . Diabetic retinopathy 12/30/2012  . Morbid obesity 12/30/2012  . OSA (obstructive sleep apnea) 12/30/2012  . Pulmonary hypertension 12/30/2012   Past Surgical History  Past Surgical History  Procedure Laterality Date  . Total hip arthroplasty Right 1980  . Av fistula placement Left    Family History  Family History  Problem Relation Age of Onset  . Arthritis Other   . Heart disease Other   . Hyperlipidemia Other   . Stroke Other   . Hypertension Other   . Kidney disease Other   . Diabetes Other    Social History  reports that he has never smoked. He has never used smokeless tobacco. He reports that he drinks about 1.5 - 2.0 oz of alcohol per week. He reports that he does not use illicit drugs. Allergies No Known Allergies Home medications Prior to Admission medications   Medication Sig Start Date End Date Taking? Authorizing Provider  allopurinol (ZYLOPRIM) 100 MG tablet Take 1 tablet (100 mg total) by mouth 2 (two) times  daily. 11/05/13  Yes Lyndal Pulley, DO  amiodarone (PACERONE) 200 MG tablet Take 1 tablet (200 mg total) by mouth daily. 11/05/13  Yes Lyndal Pulley, DO  amitriptyline (ELAVIL) 50 MG tablet Take 1 tablet (50 mg total) by mouth at bedtime. 11/11/13  Yes Biagio Borg, MD  Ascorbic Acid (VITAMIN C) 1000 MG tablet Take 1,000 mg by mouth daily.   Yes Historical Provider, MD  b complex-vitamin c-folic acid (NEPHRO-VITE) 0.8 MG TABS tablet Take 1 tablet by mouth daily.   Yes Historical Provider, MD  carvedilol (COREG) 25 MG tablet Take 25 mg by mouth 2 (two) times daily with a meal.   Yes Historical Provider, MD  clindamycin (CLEOCIN T) 1 % lotion Apply topically 2 (two) times daily. 09/04/13  Yes Biagio Borg, MD  Eflornithine HCl (VANIQA) 13.9 % cream Apply 1 application topically 2 (two) times daily with a meal.    Yes Historical Provider, MD  Multiple Vitamin (MULTIVITAMIN WITH MINERALS) TABS tablet Take 1 tablet by mouth daily.   Yes Historical Provider, MD  OVER THE COUNTER MEDICATION 2 (two) times daily. Benzaderm Wash   Yes Historical Provider, MD  OVER THE COUNTER MEDICATION 1 application 2 (two) times daily. Melaquin Complex   Yes Historical Provider, MD  PRESCRIPTION MEDICATION every  Monday, Wednesday, and Friday. Dialysis   Yes Historical Provider, MD  sevelamer carbonate (RENVELA) 800 MG tablet Take 1,600 mg by mouth 3 (three) times daily with meals.    Yes Historical Provider, MD  simvastatin (ZOCOR) 20 MG tablet Take 1 tablet (20 mg total) by mouth daily. Patient taking differently: Take 20 mg by mouth every evening.  11/05/13  Yes Lyndal Pulley, DO  sitaGLIPtin (JANUVIA) 25 MG tablet Take 1 tablet (25 mg total) by mouth daily. 11/05/13  Yes Lyndal Pulley, DO  tadalafil (CIALIS) 20 MG tablet Take 1 tablet (20 mg total) by mouth daily as needed for erectile dysfunction. 10/27/13  Yes Biagio Borg, MD  traMADol Veatrice [facility] ER) 300 MG 24 hr tablet Take 1 tablet (300 mg total) by mouth daily.  11/11/13  Yes Biagio Borg, MD  valsartan (DIOVAN) 40 MG tablet Take 40 mg by mouth 3 (three) times a week. Takes on Sunday, Tuesday, and Saturday   Yes Historical Provider, MD  warfarin (COUMADIN) 3 MG tablet Take 1-1.5 tablets (3-4.5 mg total) by mouth See admin instructions. Thursday 1.5 tablet and on the remaining days you take 1 tabletTake as directed by anticoagulation clinic 11/05/13  Yes Lyndal Pulley, DO  ciprofloxacin (CIPRO) 250 MG tablet Take 1 tablet (250 mg total) by mouth 2 (two) times daily. Patient not taking: Reported on 01/29/2014 10/27/13   Biagio Borg, MD  neomycin-polymyxin-hydrocortisone (CORTISPORIN) otic solution Place 4 drops into the right ear 4 (four) times daily. Patient not taking: Reported on 01/29/2014 10/27/13   Biagio Borg, MD  ondansetron (ZOFRAN) 4 MG tablet Take 1 tablet (4 mg total) by mouth every 6 (six) hours. Patient not taking: Reported on 01/29/2014 07/28/13   Elwyn Lade, PA-C  predniSONE (DELTASONE) 10 MG tablet 3 tabs by mouth per day for 3 days,2tabs per day for 3 days,1tab per day for 3 days Patient not taking: Reported on 01/29/2014 11/11/13   Biagio Borg, MD   Liver Function Tests  Recent Labs Lab 01/29/14 1100  AST 19  ALT 14  ALKPHOS 137*  BILITOT 0.3  PROT 7.4  ALBUMIN 3.0*   No results for input(s): LIPASE, AMYLASE in the last 168 hours. CBC  Recent Labs Lab 01/29/14 0747 01/29/14 1059  WBC 11.0* 11.8*  NEUTROABS 8.1*  --   HGB 10.0* 9.9*  HCT 32.0* 31.4*  MCV 97.6 97.8  PLT 219 [id number]   Basic Metabolic Panel  Recent Labs Lab 01/29/14 0747 01/29/14 1059  NA 129*  --   K 3.8  --   CL 85*  --   CO2 30  --   GLUCOSE 141*  --   BUN 36*  --   CREATININE 5.70* 5.92*  CALCIUM 9.7  --     Filed Vitals:   01/29/14 1231 01/29/14 1325 01/29/14 1335 01/29/14 1340  BP: 114/88 116/69  116/69  Pulse: 70 82  55  Temp:  97.8 F (36.6 C)  97.8 F (36.6 C)  TempSrc:    Oral  Resp: 20 20  25   Height:      Weight:    122.4 kg (269 lb 13.5 oz)   SpO2: 100% 100%  98%   Exam Snoring, asleep, awakens easily, diaphoretic, not in distress, a bit tachypneic No rash, cyanosis or gangrene Sclera anicteric, throat clear No jvd noted Poor air movement post lung fields, better in ant lung fields, +soft wheezing no rales RRR no MRG noted Abd obese,  NTND, no ascites Trace- 1+ pretib edema bilat Neuro is Ox 3, a little groggy , nonfocal  HD: [facility] 4h   450/800  121kg   2/2.25 [place]   LFA AVF   Heparin none Hectorol 3 ug TIW   Assessment: 1. Dyspnea - 1kg over dry wt, not convincing exam for fluid overload.  Poor air movement on exam but ED MD says it is better than it was earlier prior to nebs.  Being transferred to [facility] and will try HD on arrival, UF 2-3 kg and see if this helps. CXR crowded vessels and not much help though not grossly overloaded.  ABG ordered, concerned he might decompensate, would watch closely.   2. ESRD on HD MWF 3. Obesity 4. OSA 5. 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 Afib on amio, coreg, coumadin 8. DM2 on oral agents 9. HTN/vol cont valsartan  Plan- HD today at [facility], transfer via EMS has been requested.   Kelly Splinter MD (pgr) [phone]    ([phone] 01/29/2014, 1:47 PM

## 2014-01-29 NOTE — ED Provider Notes (Signed)
CSN: [id number]     Arrival date & time 01/29/14  [id number] History   First MD Initiated Contact with Patient 01/29/14 [id number]     Chief Complaint  Patient presents with  . Shortness of Breath  . Cough     Patient is a 58 y.o. male presenting with shortness of breath. The history is provided by the patient.  Shortness of Breath Severity:  Moderate Onset quality:  Gradual Duration:  2 days Timing:  Intermittent Progression:  Worsening Chronicity:  New Relieved by:  Rest Worsened by:  Exertion Associated symptoms: cough   Associated symptoms: no chest pain and no vomiting   Patient reports cough for over 2 weeks (no hemoptysis) with increased SOB for 2 days.  No CP.  He reports chills/fever.  No vomiting/diarrhea.  He is a dialysis patient, last treatment 2 days ago   PMH - renal failure Soc hx - nonsmoker Past Medical History  Diagnosis Date  . Arthritis   . Diabetes mellitus with neuropathy   . Glaucoma   . Hypertension   . ESRD (end stage renal disease) on dialysis   . Stroke   . Hyperlipidemia   . Atrial fibrillation 12/30/2012  . CHF (congestive heart failure) 12/30/2012  . Diabetic retinopathy 12/30/2012  . Morbid obesity 12/30/2012  . OSA (obstructive sleep apnea) 12/30/2012  . Pulmonary hypertension 12/30/2012   Past Surgical History  Procedure Laterality Date  . Total hip arthroplasty Right 1980  . Av fistula placement Left    Family History  Problem Relation Age of Onset  . Arthritis Other   . Heart disease Other   . Hyperlipidemia Other   . Stroke Other   . Hypertension Other   . Kidney disease Other   . Diabetes Other    History  Substance Use Topics  . Smoking status: Never Smoker   . Smokeless tobacco: Never Used  . Alcohol Use: 1.5 - 2.0 oz/week    3-4 drink(s) per week    Review of Systems  Constitutional: Positive for chills.  Respiratory: Positive for cough and shortness of breath.   Cardiovascular: Negative for chest pain.   Gastrointestinal: Negative for vomiting.  Musculoskeletal: Positive for back pain.       Chronic back pain   All other systems reviewed and are negative.     Allergies  Review of patient's allergies indicates no known allergies.  Home Medications   Prior to Admission medications   Medication Sig Start Date End Date Taking? Authorizing Provider  allopurinol (ZYLOPRIM) 100 MG tablet Take 1 tablet (100 mg total) by mouth 2 (two) times daily. 11/05/13  Yes Lyndal Pulley, DO  amiodarone (PACERONE) 200 MG tablet Take 1 tablet (200 mg total) by mouth daily. 11/05/13  Yes Lyndal Pulley, DO  amitriptyline (ELAVIL) 50 MG tablet Take 1 tablet (50 mg total) by mouth at bedtime. 11/11/13  Yes Biagio Borg, MD  b complex-vitamin c-folic acid (NEPHRO-VITE) 0.8 MG TABS tablet Take 1 tablet by mouth daily.   Yes Historical Provider, MD  carvedilol (COREG) 25 MG tablet Take 25 mg by mouth 2 (two) times daily with a meal.   Yes Historical Provider, MD  clindamycin (CLEOCIN T) 1 % lotion Apply topically 2 (two) times daily. 09/04/13  Yes Biagio Borg, MD  Eflornithine HCl (VANIQA) 13.9 % cream Apply 1 application topically 2 (two) times daily with a meal.    Yes Historical Provider, MD  OVER THE COUNTER MEDICATION 2 (two) times daily.  Benzaderm Wash   Yes Historical Provider, MD  OVER THE COUNTER MEDICATION 1 application 2 (two) times daily. Melaquin Complex   Yes Historical Provider, MD  PRESCRIPTION MEDICATION Dialysis twice a week   Yes Historical Provider, MD  ciprofloxacin (CIPRO) 250 MG tablet Take 1 tablet (250 mg total) by mouth 2 (two) times daily. Patient not taking: Reported on 01/29/2014 10/27/13   Biagio Borg, MD  neomycin-polymyxin-hydrocortisone (CORTISPORIN) otic solution Place 4 drops into the right ear 4 (four) times daily. Patient not taking: Reported on 01/29/2014 10/27/13   Biagio Borg, MD  ondansetron (ZOFRAN) 4 MG tablet Take 1 tablet (4 mg total) by mouth every 6 (six)  hours. Patient not taking: Reported on 01/29/2014 07/28/13   Elwyn Lade, PA-C  predniSONE (DELTASONE) 10 MG tablet 3 tabs by mouth per day for 3 days,2tabs per day for 3 days,1tab per day for 3 days Patient not taking: Reported on 01/29/2014 11/11/13   Biagio Borg, MD  sevelamer carbonate (RENVELA) 800 MG tablet Take 850 mg by mouth 3 (three) times daily with meals.     Historical Provider, MD  simvastatin (ZOCOR) 20 MG tablet Take 1 tablet (20 mg total) by mouth daily. 11/05/13   Lyndal Pulley, DO  sitaGLIPtin (JANUVIA) 25 MG tablet Take 1 tablet (25 mg total) by mouth daily. 11/05/13   Lyndal Pulley, DO  tadalafil (CIALIS) 20 MG tablet Take 1 tablet (20 mg total) by mouth daily as needed for erectile dysfunction. 10/27/13   Biagio Borg, MD  traMADol Veatrice Bourbon ER) 300 MG 24 hr tablet Take 1 tablet (300 mg total) by mouth daily. 11/11/13   Biagio Borg, MD  valsartan (DIOVAN) 40 MG tablet Take 40 mg by mouth 4 (four) times a week.     Historical Provider, MD  warfarin (COUMADIN) 3 MG tablet Take 1-1.5 tablets (3-4.5 mg total) by mouth See admin instructions. Thursday 1.5 tablet and on the remaining days you take 1 tabletTake as directed by anticoagulation clinic 11/05/13   Lyndal Pulley, DO   BP 121/56 mmHg  Temp(Src) 100.1 F (37.8 C) (Oral)  Resp 24  SpO2 100% Physical Exam CONSTITUTIONAL: Well developed/well nourished HEAD: Normocephalic/atraumatic EYES: EOMI/PERRL ENMT: Mucous membranes moist NECK: supple no meningeal signs SPINE/BACK:entire spine nontender CV: irregular, no loud murmurs LUNGS: wheezing noted bilaterally ABDOMEN: soft, nontender, no rebound or guarding, bowel sounds noted throughout abdomen GU:no cva tenderness NEURO: Pt is awake/alert/appropriate, moves all extremitiesx4.  No facial droop.   EXTREMITIES: pulses normal/equal, full ROM, dialysis access to left UE - thrill noted SKIN: warm, color normal PSYCH: no abnormalities of mood noted, alert and oriented  to situation  ED Course  Procedures   CRITICAL CARE Performed by: Sharyon Cable Total critical care time: 33 Critical care time was exclusive of separately billable procedures and treating other patients. Critical care was necessary to treat or prevent imminent or life-threatening deterioration. Critical care was time spent personally by me on the following activities: development of treatment plan with patient and/or surrogate as well as nursing, discussions with consultants, evaluation of patient's response to treatment, examination of patient, obtaining history from patient or surrogate, ordering and performing treatments and interventions, ordering and review of laboratory studies, ordering and review of radiographic studies, pulse oximetry and re-evaluation of patient's condition. PATIENT GIVEN MULTIPLE NEBULIZED TREATMENTS/PREDNISONE AND STILL HAS WHEEZING AND HYPOXIA ON AMBULATION 10:41 AM D/W DR Allyson Sabal, WILL ADMIT TO [place] AND TRANSFER DUE TO NEED FOR  DIALYSIS PT STILL WITH WHEEZING/HYPOXIA WITH RECENT COUGH/CONGESTION/FEVER HE IS DUE FOR DIALYSIS TODAY BP 98/40 mmHg  Pulse 88  Temp(Src) 99.3 F (37.4 C) (Oral)  Resp 20  Ht 5\' 9"  (1.753 m)  Wt 268 lb (121.564 kg)  BMI 39.56 kg/m2  SpO2 88%  Labs Review Labs Reviewed  BASIC METABOLIC PANEL - Abnormal; Notable for the following:    Sodium 129 (*)    Chloride 85 (*)    Glucose, Bld 141 (*)    BUN 36 (*)    Creatinine, Ser 5.70 (*)    GFR calc non Af Amer 10 (*)    GFR calc Af Amer 11 (*)    All other components within normal limits  CBC WITH DIFFERENTIAL - Abnormal; Notable for the following:    WBC 11.0 (*)    RBC 3.28 (*)    Hemoglobin 10.0 (*)    HCT 32.0 (*)    Neutro Abs 8.1 (*)    Monocytes Absolute 1.1 (*)    All other components within normal limits  PRO B NATRIURETIC PEPTIDE - Abnormal; Notable for the following:    Pro B Natriuretic peptide (BNP) 7824.0 (*)    All other components within normal  limits  PROTIME-INR - Abnormal; Notable for the following:    Prothrombin Time 29.2 (*)    INR 2.74 (*)    All other components within normal limits  CBC  CREATININE, SERUM  HEPATIC FUNCTION PANEL  MAGNESIUM  TROPONIN I  TROPONIN I  TROPONIN I  HEMOGLOBIN A1C  I-STAT TROPOININ, ED    Imaging Review Dg Chest Portable 1 View  01/29/2014   CLINICAL DATA:  Shortness of Breath  EXAM: PORTABLE CHEST - 1 VIEW  COMPARISON:  06/18/2013  FINDINGS: Borderline cardiomegaly. Central mild vascular congestion without convincing pulmonary edema. Study is limited by poor inspiration. Bilateral basilar atelectasis. No segmental infiltrate.  IMPRESSION: Central mild vascular congestion without convincing pulmonary edema. Poor inspiration with bilateral basilar atelectasis. No segmental infiltrates.   Electronically Signed   By: Lahoma Crocker M.D.   On: 01/29/2014 08:14     EKG Interpretation   Date/Time:  Friday January 29 2014 07:40:58 EST Ventricular Rate:  84 PR Interval:    QRS Duration: 94 QT Interval:  399 QTC Calculation: 472 R Axis:   -107 Text Interpretation:  Atrial fibrillation Left anterior fascicular block  Abnormal R-wave progression, late transition Confirmed by Christy Gentles  MD,  Kamira Mellette ([id number]) on 01/29/2014 8:10:07 AM     Medications  predniSONE (DELTASONE) tablet 60 mg (not administered)  heparin injection 5,000 Units (not administered)  sodium chloride 0.9 % injection 3 mL (not administered)  sodium chloride 0.9 % injection 3 mL (not administered)  sodium chloride 0.9 % injection 3 mL (not administered)  0.9 %  sodium chloride infusion (not administered)  acetaminophen (TYLENOL) tablet 650 mg (not administered)    Or  acetaminophen (TYLENOL) suppository 650 mg (not administered)  HYDROmorphone (DILAUDID) injection 0.5 mg (not administered)  docusate sodium (COLACE) capsule 100 mg (not administered)  ondansetron (ZOFRAN) tablet 4 mg (not administered)    Or  ondansetron  (ZOFRAN) injection 4 mg (not administered)  levalbuterol (XOPENEX) nebulizer solution 0.63 mg (not administered)  ipratropium-albuterol (DUONEB) 0.5-2.5 (3) MG/3ML nebulizer solution 3 mL (3 mLs Nebulization Given 01/29/14 0800)  acetaminophen (TYLENOL) tablet 650 mg (650 mg Oral Given 01/29/14 0825)  albuterol (PROVENTIL) (2.5 MG/3ML) 0.083% nebulizer solution 5 mg (5 mg Nebulization Given 01/29/14 0845)    MDM  Final diagnoses:  Shortness of breath  Chronic atrial fibrillation  Renal failure  Hypoxia  Status asthmaticus, unspecified asthma severity    Nursing notes including past medical history and social history reviewed and considered in documentation xrays/imaging reviewed by myself and considered during evaluation Labs/vital reviewed myself and considered during evaluation     Sharyon Cable, MD 01/29/14 1050

## 2014-01-29 NOTE — ED Notes (Signed)
Patient's lung sounds post breathing treatment are improved, but he still has expiratory wheezing.

## 2014-01-29 NOTE — ED Notes (Signed)
Bed: WA02 Expected date:  Expected time:  Means of arrival:  Comments: 

## 2014-01-29 NOTE — H&P (Addendum)
[facility] History and Physical  Daniel Whitney [id number] DOB: [birth year] DOA: 01/29/2014  Referring physician:  PCP: Cathlean Cower, MD   Chief Complaint:  Shortness of Breath   . Cough     HPI:  58 year old male with a history of end-stage renal disease on hemodialysis Monday Wednesday Friday, atrial fibrillation, dyslipidemia, diabetes who presents to the ER with shortness of breath. He is compliant with his hemodialysis and last had dialysis on Wednesday. He presents to the ER today with shortness of breath worsening over the last couple of days.Reports non productive cough x2 weeks. Increased SOB x2 days. Denies chest pain. Able to speak in short sentences. Denies n/v/d. Chronic back pain 7/10. Oxygen saturation dropped to 88% on room air. Patient has been Around his grand child who had a viral infection this week. No subjective fevers.  Chest x-ray showed central pulmonary vascular congestion without pulmonary edema.      Review of Systems: negative for the following  Constitutional: Positive for chills.  Respiratory: Positive for cough and shortness of breath.  Cardiovascular: Negative for chest pain.  Gastrointestinal: Negative for vomiting.  Musculoskeletal: Positive for back pain Skin: Denies pallor, rash and wound.  Neurological: Denies dizziness, seizures, syncope, weakness, light-headedness, numbness and headaches.  Hematological: Denies adenopathy. Easy bruising, personal or family bleeding history  Psychiatric/Behavioral: Denies suicidal ideation, mood changes, confusion, nervousness, sleep disturbance and agitation  All other systems reviewed and are negative     Past Medical History  Diagnosis Date  . Arthritis   . Diabetes mellitus with neuropathy   . Glaucoma   . Hypertension   . ESRD (end stage renal disease) on dialysis   . Stroke   . Hyperlipidemia   . Atrial fibrillation 12/30/2012  . CHF (congestive heart failure) 12/30/2012  . Diabetic  retinopathy 12/30/2012  . Morbid obesity 12/30/2012  . OSA (obstructive sleep apnea) 12/30/2012  . Pulmonary hypertension 12/30/2012     Past Surgical History  Procedure Laterality Date  . Total hip arthroplasty Right 1980  . Av fistula placement Left       Social History:  reports that he has never smoked. He has never used smokeless tobacco. He reports that he drinks about 1.5 - 2.0 oz of alcohol per week. He reports that he does not use illicit drugs.    No Known Allergies  Family History  Problem Relation Age of Onset  . Arthritis Other   . Heart disease Other   . Hyperlipidemia Other   . Stroke Other   . Hypertension Other   . Kidney disease Other   . Diabetes Other      Prior to Admission medications   Medication Sig Start Date End Date Taking? Authorizing Provider  allopurinol (ZYLOPRIM) 100 MG tablet Take 1 tablet (100 mg total) by mouth 2 (two) times daily. 11/05/13  Yes Lyndal Pulley, DO  amiodarone (PACERONE) 200 MG tablet Take 1 tablet (200 mg total) by mouth daily. 11/05/13  Yes Lyndal Pulley, DO  amitriptyline (ELAVIL) 50 MG tablet Take 1 tablet (50 mg total) by mouth at bedtime. 11/11/13  Yes Biagio Borg, MD  Ascorbic Acid (VITAMIN C) 1000 MG tablet Take 1,000 mg by mouth daily.   Yes Historical Provider, MD  b complex-vitamin c-folic acid (NEPHRO-VITE) 0.8 MG TABS tablet Take 1 tablet by mouth daily.   Yes Historical Provider, MD  carvedilol (COREG) 25 MG tablet Take 25 mg by mouth 2 (two) times daily with a meal.  Yes Historical Provider, MD  clindamycin (CLEOCIN T) 1 % lotion Apply topically 2 (two) times daily. 09/04/13  Yes Biagio Borg, MD  Eflornithine HCl (VANIQA) 13.9 % cream Apply 1 application topically 2 (two) times daily with a meal.    Yes Historical Provider, MD  Multiple Vitamin (MULTIVITAMIN WITH MINERALS) TABS tablet Take 1 tablet by mouth daily.   Yes Historical Provider, MD  OVER THE COUNTER MEDICATION 2 (two) times daily. Benzaderm Wash    Yes Historical Provider, MD  OVER THE COUNTER MEDICATION 1 application 2 (two) times daily. Melaquin Complex   Yes Historical Provider, MD  PRESCRIPTION MEDICATION every Monday, Wednesday, and Friday. Dialysis   Yes Historical Provider, MD  sevelamer carbonate (RENVELA) 800 MG tablet Take 1,600 mg by mouth 3 (three) times daily with meals.    Yes Historical Provider, MD  simvastatin (ZOCOR) 20 MG tablet Take 1 tablet (20 mg total) by mouth daily. Patient taking differently: Take 20 mg by mouth every evening.  11/05/13  Yes Lyndal Pulley, DO  sitaGLIPtin (JANUVIA) 25 MG tablet Take 1 tablet (25 mg total) by mouth daily. 11/05/13  Yes Lyndal Pulley, DO  tadalafil (CIALIS) 20 MG tablet Take 1 tablet (20 mg total) by mouth daily as needed for erectile dysfunction. 10/27/13  Yes Biagio Borg, MD  traMADol Veatrice Bourbon ER) 300 MG 24 hr tablet Take 1 tablet (300 mg total) by mouth daily. 11/11/13  Yes Biagio Borg, MD  valsartan (DIOVAN) 40 MG tablet Take 40 mg by mouth 3 (three) times a week. Takes on Sunday, Tuesday, and Saturday   Yes Historical Provider, MD  warfarin (COUMADIN) 3 MG tablet Take 1-1.5 tablets (3-4.5 mg total) by mouth See admin instructions. Thursday 1.5 tablet and on the remaining days you take 1 tabletTake as directed by anticoagulation clinic 11/05/13  Yes Lyndal Pulley, DO  ciprofloxacin (CIPRO) 250 MG tablet Take 1 tablet (250 mg total) by mouth 2 (two) times daily. Patient not taking: Reported on 01/29/2014 10/27/13   Biagio Borg, MD  neomycin-polymyxin-hydrocortisone (CORTISPORIN) otic solution Place 4 drops into the right ear 4 (four) times daily. Patient not taking: Reported on 01/29/2014 10/27/13   Biagio Borg, MD  ondansetron (ZOFRAN) 4 MG tablet Take 1 tablet (4 mg total) by mouth every 6 (six) hours. Patient not taking: Reported on 01/29/2014 07/28/13   Elwyn Lade, PA-C  predniSONE (DELTASONE) 10 MG tablet 3 tabs by mouth per day for 3 days,2tabs per day for 3 days,1tab  per day for 3 days Patient not taking: Reported on 01/29/2014 11/11/13   Biagio Borg, MD     Physical Exam: Filed Vitals:   01/29/14 0804 01/29/14 0821 01/29/14 0940 01/29/14 0952  BP:  94/61  98/40  Pulse:  90 102 88  Temp:    99.3 F (37.4 C)  TempSrc:    Oral  Resp:    20  Height:  5\' 9"  (1.753 m)    Weight:  121.564 kg (268 lb)    SpO2: 100% 98% 89% 88%     Constitutional: Vital signs reviewed. Patient is a well-developed and well-nourished in no acute distress and cooperative with exam. Alert and oriented x3.  Head: Normocephalic and atraumatic  Ear: TM normal bilaterally  Mouth: no erythema or exudates, MMM  Eyes: PERRL, EOMI, conjunctivae normal, No scleral icterus.  Neck: Supple, Trachea midline normal ROM, No JVD, mass, thyromegaly, or carotid bruit present.  Cardiovascular: RRR, S1 normal, S2  normal, no MRG, pulses symmetric and intact bilaterally  Pulmonary/Chest: CTAB, no wheezes, rales, or rhonchi  Abdominal: Soft. Non-tender, non-distended, bowel sounds are normal, no masses, organomegaly, or guarding present.  GU: no CVA tenderness Musculoskeletal: No joint deformities, erythema, or stiffness, ROM full and no nontender Ext: no edema and no cyanosis, pulses palpable bilaterally (DP and PT)  Hematology: no cervical, inginal, or axillary adenopathy.  Neurological: A&O x3, Strenght is normal and symmetric bilaterally, cranial nerve II-XII are grossly intact, no focal motor deficit, sensory intact to light touch bilaterally.  Skin: Warm, dry and intact. No rash, cyanosis, or clubbing.  Psychiatric: Normal mood and affect. speech and behavior is normal. Judgment and thought content normal. Cognition and memory are normal.       Labs on Admission:    Basic Metabolic Panel:  Recent Labs Lab 01/29/14 0747 01/29/14 1059 01/29/14 1100  NA 129*  --   --   K 3.8  --   --   CL 85*  --   --   CO2 30  --   --   GLUCOSE 141*  --   --   BUN 36*  --   --    CREATININE 5.70* 5.92*  --   CALCIUM 9.7  --   --   MG  --   --  1.9   Liver Function Tests:  Recent Labs Lab 01/29/14 1100  AST 19  ALT 14  ALKPHOS 137*  BILITOT 0.3  PROT 7.4  ALBUMIN 3.0*   No results for input(s): LIPASE, AMYLASE in the last 168 hours. No results for input(s): AMMONIA in the last 168 hours. CBC:  Recent Labs Lab 01/29/14 0747 01/29/14 1059  WBC 11.0* 11.8*  NEUTROABS 8.1*  --   HGB 10.0* 9.9*  HCT 32.0* 31.4*  MCV 97.6 97.8  PLT 219 236   Cardiac Enzymes:  Recent Labs Lab 01/29/14 1059  TROPONINI <0.30    BNP (last 3 results)  Recent Labs  01/29/14 0748  PROBNP 7824.0*      CBG:  Recent Labs Lab 01/29/14 1111  GLUCAP 146*    Radiological Exams on Admission: Dg Chest Portable 1 View  01/29/2014   CLINICAL DATA:  Shortness of Breath  EXAM: PORTABLE CHEST - 1 VIEW  COMPARISON:  06/18/2013  FINDINGS: Borderline cardiomegaly. Central mild vascular congestion without convincing pulmonary edema. Study is limited by poor inspiration. Bilateral basilar atelectasis. No segmental infiltrate.  IMPRESSION: Central mild vascular congestion without convincing pulmonary edema. Poor inspiration with bilateral basilar atelectasis. No segmental infiltrates.   Electronically Signed   By: Lahoma Crocker M.D.   On: 01/29/2014 08:14    EKG: Independently reviewed.     Assessment/Plan Active Problems:   SOB (shortness of breath)   Shortness of breath probably secondary to acute viral bronchitis No obvious pneumonia Patient due for hemodialysis today with slight bony vascular congestion Hypoxia may improve after hemodialysis When necessary nebulizer treatments Respiratory panel ordered and pending We'll cycle cardiac enzymes  Atrial fibrillation controlled Continue Coreg, Coumadin Coumadin per pharmacy  Diabetes , will start the patient on sliding scale insulin  ESRD Will transfer [facility] to [facility] for hemodialysis Notify Dr.  [name]  Hypertension continue ARB, Coreg  Code Status:   full Family Communication: bedside Disposition Plan: admit   Time spent: 70 mins   [facility] Pager [phone]  If 7PM-7AM, please contact night-coverage [email or web address] Password [email or web address] 01/29/2014, 12:21 PM

## 2014-01-30 DIAGNOSIS — Z992 Dependence on renal dialysis: Secondary | ICD-10-CM

## 2014-01-30 DIAGNOSIS — I10 Essential (primary) hypertension: Secondary | ICD-10-CM

## 2014-01-30 DIAGNOSIS — J209 Acute bronchitis, unspecified: Secondary | ICD-10-CM

## 2014-01-30 LAB — COMPREHENSIVE METABOLIC PANEL
ALBUMIN: 2.8 g/dL — AB (ref 3.5–5.2)
ALT: 15 U/L (ref 0–53)
AST: 20 U/L (ref 0–37)
Alkaline Phosphatase: 142 U/L — ABNORMAL HIGH (ref 39–117)
Anion gap: 14 (ref 5–15)
BUN: 29 mg/dL — ABNORMAL HIGH (ref 6–23)
CHLORIDE: 90 meq/L — AB (ref 96–112)
CO2: 26 mEq/L (ref 19–32)
CREATININE: 4.6 mg/dL — AB (ref 0.50–1.35)
Calcium: 9.1 mg/dL (ref 8.4–10.5)
GFR calc Af Amer: 15 mL/min — ABNORMAL LOW (ref >90)
GFR calc non Af Amer: 13 mL/min — ABNORMAL LOW (ref >90)
Glucose, Bld: 132 mg/dL — ABNORMAL HIGH (ref 70–99)
Potassium: 4.4 mEq/L (ref 3.7–5.3)
Sodium: 130 mEq/L — ABNORMAL LOW (ref 137–147)
TOTAL PROTEIN: 7.5 g/dL (ref 6.0–8.3)
Total Bilirubin: 0.2 mg/dL — ABNORMAL LOW (ref 0.3–1.2)

## 2014-01-30 LAB — PROTIME-INR
INR: 3.06 — ABNORMAL HIGH (ref 0.00–1.49)
Prothrombin Time: 31.9 seconds — ABNORMAL HIGH (ref 11.6–15.2)

## 2014-01-30 LAB — CBC
HEMATOCRIT: 32.9 % — AB (ref 39.0–52.0)
HEMOGLOBIN: 10.3 g/dL — AB (ref 13.0–17.0)
MCH: 30.1 pg (ref 26.0–34.0)
MCHC: 31.3 g/dL (ref 30.0–36.0)
MCV: 96.2 fL (ref 78.0–100.0)
Platelets: 249 10*3/uL (ref 150–400)
RBC: 3.42 MIL/uL — ABNORMAL LOW (ref 4.22–5.81)
RDW: 14.7 % (ref 11.5–15.5)
WBC: 12.8 10*3/uL — ABNORMAL HIGH (ref 4.0–10.5)

## 2014-01-30 LAB — GLUCOSE, CAPILLARY
GLUCOSE-CAPILLARY: 126 mg/dL — AB (ref 70–99)
Glucose-Capillary: 226 mg/dL — ABNORMAL HIGH (ref 70–99)
Glucose-Capillary: 154 mg/dL — ABNORMAL HIGH (ref 70–99)
Glucose-Capillary: 212 mg/dL — ABNORMAL HIGH (ref 70–99)
Glucose-Capillary: 265 mg/dL — ABNORMAL HIGH (ref 70–99)

## 2014-01-30 MED ORDER — FLUTICASONE PROPIONATE 50 MCG/ACT NA SUSP
2.0000 | Freq: Every day | NASAL | Status: DC
  Administered 2014-01-30 – 2014-01-31 (×2): 2 via NASAL
  Filled 2014-01-30: qty 16

## 2014-01-30 MED ORDER — PREDNISONE 20 MG PO TABS
40.0000 mg | ORAL_TABLET | Freq: Every day | ORAL | Status: DC
  Administered 2014-01-30 – 2014-01-31 (×2): 40 mg via ORAL
  Filled 2014-01-30 (×3): qty 2

## 2014-01-30 MED ORDER — OXYMETAZOLINE HCL 0.05 % NA SOLN
1.0000 | Freq: Two times a day (BID) | NASAL | Status: DC
  Administered 2014-01-30 – 2014-01-31 (×3): 1 via NASAL
  Filled 2014-01-30: qty 15

## 2014-01-30 MED ORDER — SALINE SPRAY 0.65 % NA SOLN
1.0000 | NASAL | Status: DC | PRN
  Filled 2014-01-30: qty 44

## 2014-01-30 MED ORDER — WARFARIN SODIUM 3 MG PO TABS
3.0000 mg | ORAL_TABLET | ORAL | Status: DC
Start: 2014-01-31 — End: 2014-01-31
  Filled 2014-01-30: qty 1

## 2014-01-30 MED ORDER — WARFARIN SODIUM 2 MG PO TABS
2.0000 mg | ORAL_TABLET | Freq: Once | ORAL | Status: AC
  Administered 2014-01-30: 2 mg via ORAL
  Filled 2014-01-30: qty 1

## 2014-01-30 MED ORDER — LORATADINE 10 MG PO TABS
10.0000 mg | ORAL_TABLET | Freq: Every day | ORAL | Status: DC
  Administered 2014-01-30 – 2014-01-31 (×2): 10 mg via ORAL
  Filled 2014-01-30 (×2): qty 1

## 2014-01-30 MED ORDER — DM-GUAIFENESIN ER 30-600 MG PO TB12
1.0000 | ORAL_TABLET | Freq: Two times a day (BID) | ORAL | Status: DC
  Administered 2014-01-30 – 2014-01-31 (×3): 1 via ORAL
  Filled 2014-01-30 (×4): qty 1

## 2014-01-30 MED ORDER — INSULIN ASPART 100 UNIT/ML ~~LOC~~ SOLN
0.0000 [IU] | Freq: Three times a day (TID) | SUBCUTANEOUS | Status: DC
  Administered 2014-01-30 – 2014-01-31 (×3): 3 [IU] via SUBCUTANEOUS

## 2014-01-30 MED ORDER — LEVOFLOXACIN 500 MG PO TABS
500.0000 mg | ORAL_TABLET | Freq: Once | ORAL | Status: AC
  Administered 2014-01-30: 500 mg via ORAL
  Filled 2014-01-30: qty 1

## 2014-01-30 MED ORDER — LEVOFLOXACIN 250 MG PO TABS: 250.0000 mg | ORAL_TABLET | ORAL | Status: DC

## 2014-01-30 NOTE — Progress Notes (Addendum)
PATIENT DETAILS Name: Daniel Whitney Age: 58 y.o. Sex: male Date of Birth: 10-24-1955 Admit Date: 01/29/2014 Admitting Physician Evalee Mutton Kristeen Mans, MD [id number] Jenny Reichmann, MD  Subjective: Feels better-wants to go home. Still coughing. Complains of nasal congestion.  Assessment/Plan: Active Problems: Acute hypoxic Resp Failure: Suspect either acute bronchitis/pneumonitis with some volume overload as the etiology. Start tapering prednisone, empiric Levaquin, continue with nebulized bronchodilators. Volume removal with hemodialysis,renal following and planning on extra hemodialysis today. Since complaining of nasal congestion-we'll start nasal decongestant spray and Flonase along with a antihistamine. Follow clinically.  Suspected Acute Bronchitis:see above. Await resp virus panel.  ESRD: On hemodialysis MWF. Nephrology planning HD today.  History of atrial fibrillation: Rate controlled with amiodarone and Coreg, on Coumadin-pharmacy managing while inpatient.  History of hypertension: Controlled with Coreg and Avapro. Follow BP and titrate medications accordingly.  History of chronic diastolic heart failure: Volume removal during hemodialysis.  History of pulmonary hypertension: Suspect secondary to OSA.  History of OSA: Not on CPAP as an outpatient-claims he never followed up on sleep studies. Defer to the outpatient setting  History of type 2 diabetes: Expect some amount of hyperglycemia while on prednisone, start SSI. Follow CBGs. Hold all oral hypoglycemic agents while inpatient  History of gout: Stable, continue with allopurinol  History of morbid obesity: Counseled regarding importance of weight loss  Disposition: Remain inpatient  Antibiotics:  See below   Anti-infectives    Start     Dose/Rate Route Frequency Ordered Stop   02/01/14 1200  levofloxacin (LEVAQUIN) tablet 250 mg     250 mg Oral Every 48 hours 01/30/14 1032     01/30/14 1200  levofloxacin (LEVAQUIN)  tablet 500 mg     500 mg Oral  Once 01/30/14 1032        DVT Prophylaxis: Coumadin  Code Status: Full code   Family Communication None at bedside  Procedures:  None  CONSULTS:  nephrology  Time spent 40 minutes-which includes 50% of the time with face-to-face with patient/ family and coordinating care related to the above assessment and plan.  MEDICATIONS: Scheduled Meds: . allopurinol  100 mg Oral BID  . amiodarone  200 mg Oral Daily  . amitriptyline  50 mg Oral QHS  . carvedilol  25 mg Oral BID WC  . dextromethorphan-guaiFENesin  1 tablet Oral BID  . docusate sodium  100 mg Oral BID  . fluticasone  2 spray Each Nare Daily  . irbesartan  75 mg Oral Once per day on Sun Tue Sat  . [START ON 02/01/2014] levofloxacin  250 mg Oral Q48H  . levofloxacin  500 mg Oral Once  . linagliptin  5 mg Oral Daily  . loratadine  10 mg Oral Daily  . neomycin-polymyxin-hydrocortisone  4 drop Right Ear QID  . oxymetazoline  1 spray Each Nare BID  . predniSONE  40 mg Oral Q breakfast  . sevelamer carbonate  1,600 mg Oral TID WC  . simvastatin  20 mg Oral Daily  . sodium chloride  3 mL Intravenous Q12H  . sodium chloride  3 mL Intravenous Q12H  . warfarin  3 mg Oral Once per day on Sun Mon Tue Wed Fri Sat  . [START ON 02/04/2014] warfarin  4.5 mg Oral Once per day on Thu  . Warfarin - Pharmacist Dosing Inpatient   Does not apply q1800   Continuous Infusions:  PRN Meds:.sodium chloride, acetaminophen **OR** acetaminophen, HYDROmorphone (DILAUDID) injection, levalbuterol, ondansetron **OR** ondansetron (  ZOFRAN) IV, sodium chloride, sodium chloride    PHYSICAL EXAM: Vital signs in last 24 hours: Filed Vitals:   01/29/14 1855 01/29/14 2135 01/30/14 0559 01/30/14 0945  BP: 94/62 115/63 102/56 123/75  Pulse: 80 87 83 77  Temp:  98.7 F (37.1 C) 98 F (36.7 C) 98.2 F (36.8 C)  TempSrc:  Oral Oral Oral  Resp: 14 14 16 16   Height:      Weight:      SpO2: 95% 98% 93% 93%     Weight change:  Filed Weights   01/29/14 1335 01/29/14 1409 01/29/14 1801  Weight: 122.4 kg (269 lb 13.5 oz) 122.4 kg (269 lb 13.5 oz) 119.3 kg (263 lb 0.1 oz)   Body mass index is 38.82 kg/(m^2).   Gen Exam: Awake and alert with clear speech.   Neck: Supple, No JVD.   Chest: B/L Clear.   CVS: S1 S2 Regular, no murmurs.  Abdomen: soft, BS +, non tender, non distended.  Extremities: no edema, lower extremities warm to touch. Neurologic: Non Focal.   Skin: No Rash.   Wounds: N/A.   Intake/Output from previous day:  Intake/Output Summary (Last 24 hours) at 01/30/14 1256 Last data filed at 01/30/14 1100  Gross per 24 hour  Intake    240 ml  Output   3100 ml  Net  -2860 ml     LAB RESULTS: CBC  Recent Labs Lab 01/29/14 0747 01/29/14 1059 01/30/14 0509  WBC 11.0* 11.8* 12.8*  HGB 10.0* 9.9* 10.3*  HCT 32.0* 31.4* 32.9*  PLT 219 236 249  MCV 97.6 97.8 96.2  MCH 30.5 30.8 30.1  MCHC 31.3 31.5 31.3  RDW 14.4 14.6 14.7  LYMPHSABS 1.7  --   --   MONOABS 1.1*  --   --   EOSABS 0.1  --   --   BASOSABS 0.0  --   --     Chemistries   Recent Labs Lab 01/29/14 0747 01/29/14 1059 01/29/14 1100 01/30/14 0509  NA 129*  --   --  130*  K 3.8  --   --  4.4  CL 85*  --   --  90*  CO2 30  --   --  26  GLUCOSE 141*  --   --  132*  BUN 36*  --   --  29*  CREATININE 5.70* 5.92*  --  4.60*  CALCIUM 9.7  --   --  9.1  MG  --   --  1.9  --     CBG:  Recent Labs Lab 01/29/14 1111 01/29/14 1627 01/30/14 0805 01/30/14 1230  GLUCAP 146* 126* 226* 154*    GFR Estimated Creatinine Clearance: 22 mL/min (by C-G formula based on Cr of 4.6).  Coagulation profile  Recent Labs Lab 01/29/14 0747  INR 2.74*    Cardiac Enzymes  Recent Labs Lab 01/29/14 1059  TROPONINI <0.30    Invalid input(s): POCBNP No results for input(s): DDIMER in the last 72 hours.  Recent Labs  01/29/14 1059  HGBA1C 6.5*   No results for input(s): CHOL, HDL, LDLCALC, TRIG,  CHOLHDL, LDLDIRECT in the last 72 hours. No results for input(s): TSH, T4TOTAL, T3FREE, THYROIDAB in the last 72 hours.  Invalid input(s): FREET3 No results for input(s): VITAMINB12, FOLATE, FERRITIN, TIBC, IRON, RETICCTPCT in the last 72 hours. No results for input(s): LIPASE, AMYLASE in the last 72 hours.  Urine Studies No results for input(s): UHGB, CRYS in the last 72 hours.  Invalid input(s): UACOL, UAPR, USPG, UPH, UTP, UGL, UKET, UBIL, UNIT, UROB, ULEU, UEPI, UWBC, URBC, UBAC, CAST, UCOM, BILUA  MICROBIOLOGY: Recent Results (from the past 240 hour(s))  MRSA PCR Screening     Status: None   Collection Time: 01/29/14  6:56 PM  Result Value Ref Range Status   MRSA by PCR NEGATIVE NEGATIVE Final    Comment:        The GeneXpert MRSA Assay (FDA approved for NASAL specimens only), is one component of a comprehensive MRSA colonization surveillance program. It is not intended to diagnose MRSA infection nor to guide or monitor treatment for MRSA infections.     RADIOLOGY STUDIES/RESULTS: Dg Chest Portable 1 View  01/29/2014   CLINICAL DATA:  Shortness of Breath  EXAM: PORTABLE CHEST - 1 VIEW  COMPARISON:  06/18/2013  FINDINGS: Borderline cardiomegaly. Central mild vascular congestion without convincing pulmonary edema. Study is limited by poor inspiration. Bilateral basilar atelectasis. No segmental infiltrate.  IMPRESSION: Central mild vascular congestion without convincing pulmonary edema. Poor inspiration with bilateral basilar atelectasis. No segmental infiltrates.   Electronically Signed   By: Lahoma Crocker M.D.   On: 01/29/2014 08:14    Oren Binet, MD  [facility] Pager:[phone]  If 7PM-7AM, please contact night-coverage [email or web address] Password [email or web address] 01/30/2014, 12:56 PM   LOS: 1 day

## 2014-01-30 NOTE — Progress Notes (Signed)
[facility] Progress Note   Subjective: Coughing up thick greenish-brown phlegm.  Had 3kg with HD and is now below his dry wt but he say the HD " didn't help or hurt my breathing".    Filed Vitals:   01/29/14 1801 01/29/14 1855 01/29/14 2135 01/30/14 0559  BP: 109/55 94/62 115/63 102/56  Pulse: 75 80 87 83  Temp: 98.2 F (36.8 C)  98.7 F (37.1 C) 98 F (36.7 C)  TempSrc: Oral  Oral Oral  Resp: 18 14 14 16   Height:      Weight: 119.3 kg (263 lb 0.1 oz)     SpO2: 92% 95% 98% 93%   Exam: Looks better, more alert, not falling asleep No rash, cyanosis or gangrene Sclera anicteric, throat clear No jvd noted No wheezing today and air movement better post lung fields, still poor though RRR no MRG noted Abd obese, NTND, no ascites Trace pretib edema bilat Neuro is Ox 3, nonfocal, wide awake today  HD: [facility] 4h 450/800 121kg 2/2.25 [facility] LFA AVF Heparin none Hectorol 3 ug TIW   Assessment: 1. Dyspnea - probable vol component, rx'd yest with HD. Thick phlegm as well, on abx for bronchitis.   2. ESRD on HD MWF 3. Obesity 4. OSA 5. 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 Afib on amio, coreg, coumadin 8. DM2 on oral agents 9. HTN/vol cont valsartan 10. Hyponatremia  Plan- extra HD today to get more fluid off as tolerated    Kelly Splinter MD  pager [phone]    cell [phone]  01/30/2014, 8:44 AM     Recent Labs Lab 01/29/14 0747 01/29/14 1059 01/30/14 0509  NA 129*  --  130*  K 3.8  --  4.4  CL 85*  --  90*  CO2 30  --  26  GLUCOSE 141*  --  132*  BUN 36*  --  29*  CREATININE 5.70* 5.92* 4.60*  CALCIUM 9.7  --  9.1    Recent Labs Lab 01/29/14 1100 01/30/14 0509  AST 19 20  ALT 14 15  ALKPHOS 137* 142*  BILITOT 0.3 0.2*  PROT 7.4 7.5  ALBUMIN 3.0* 2.8*    Recent Labs Lab 01/29/14 0747 01/29/14 1059 01/30/14 0509  WBC 11.0* 11.8* 12.8*  NEUTROABS 8.1*  --   --   HGB 10.0* 9.9* 10.3*  HCT 32.0* 31.4* 32.9*  MCV  97.6 97.8 96.2  PLT 219 236 249   . allopurinol  100 mg Oral BID  . amiodarone  200 mg Oral Daily  . amitriptyline  50 mg Oral QHS  . carvedilol  25 mg Oral BID WC  . docusate sodium  100 mg Oral BID  . heparin  5,000 Units Subcutaneous 3 times per day  . irbesartan  75 mg Oral Once per day on Sun Tue Sat  . linagliptin  5 mg Oral Daily  . neomycin-polymyxin-hydrocortisone  4 drop Right Ear QID  . sevelamer carbonate  1,600 mg Oral TID WC  . simvastatin  20 mg Oral Daily  . sodium chloride  3 mL Intravenous Q12H  . sodium chloride  3 mL Intravenous Q12H  . warfarin  3 mg Oral Once per day on Sun Mon Tue Wed Fri Sat  . [START ON 02/04/2014] warfarin  4.5 mg Oral Once per day on Thu  . Warfarin - Pharmacist Dosing Inpatient   Does not apply q1800     sodium chloride, acetaminophen **OR** acetaminophen, HYDROmorphone (  DILAUDID) injection, levalbuterol, ondansetron **OR** ondansetron (ZOFRAN) IV, sodium chloride

## 2014-01-30 NOTE — Progress Notes (Addendum)
ANTICOAGULATION CONSULT NOTE - Follow Up Consult  Pharmacy Consult for Coumadin and Levaquin Indication: Afib, hx CVA; bronchitis  No Known Allergies  Patient Measurements: Height: 5\' 9"  (175.3 cm) Weight: 263 lb 0.1 oz (119.3 kg) IBW/kg (Calculated) : 70.7 Heparin Dosing Weight:   Vital Signs: Temp: 98 F (36.7 C) (12/19 0559) Temp Source: Oral (12/19 0559) BP: 102/56 mmHg (12/19 0559) Pulse Rate: 83 (12/19 0559)  Labs:  Recent Labs  01/29/14 0747 01/29/14 1059 01/30/14 0509  HGB 10.0* 9.9* 10.3*  HCT 32.0* 31.4* 32.9*  PLT 219 236 249  LABPROT 29.2*  --   --   INR 2.74*  --   --   CREATININE 5.70* 5.92* 4.60*  TROPONINI  --  <0.30  --     Estimated Creatinine Clearance: 22 mL/min (by C-G formula based on Cr of 4.6).  Assessment: 1. 58yom continues on Coumadin for Afib. Admit INR was therapeutic at 2.74 on PTA regimen (3mg  daily except 4.5mg  on Thursdays) - will continue current regimen and follow-up INR.  - H/H and Plts stable - No significant bleeding reported  2. Pharmacy has been consulted to start patient on Levaquin for bronchitis. Patient is currently afebrile with WBC 12.8. Patient has ESRD and receives HD on MWF - will adjust antibiotic regimen.  Goal of Therapy:  INR 2-3   Plan:  1. Discontinue SQ heparin 2. Daily INR 3. Continue Coumadin PTA regimen - 3mg  today 4. Levaquin 500mg  PO x 1, then 250mg  PO q48h 5. Monitor CBC, bleeding, renal plans  Earleen Newport  [id number] 01/30/2014,10:28 AM    Addendum: INR was checked today and increased to 3.06 (just above goal level) - will give reduced dose tonight and follow-up AM INR.  Plan: 1. Change Coumadin dose to 2mg  tonight 2. Daily INR  Janina Mayo, PharmD Clinical Pharmacist [phone] 01/30/2014, 4:06 PM

## 2014-01-31 DIAGNOSIS — J208 Acute bronchitis due to other specified organisms: Principal | ICD-10-CM

## 2014-01-31 DIAGNOSIS — I480 Paroxysmal atrial fibrillation: Secondary | ICD-10-CM

## 2014-01-31 LAB — PROTIME-INR
INR: 3.35 — ABNORMAL HIGH (ref 0.00–1.49)
PROTHROMBIN TIME: 34.2 s — AB (ref 11.6–15.2)

## 2014-01-31 LAB — CBC
HEMATOCRIT: 33.7 % — AB (ref 39.0–52.0)
HEMOGLOBIN: 11 g/dL — AB (ref 13.0–17.0)
MCH: 31.8 pg (ref 26.0–34.0)
MCHC: 32.6 g/dL (ref 30.0–36.0)
MCV: 97.4 fL (ref 78.0–100.0)
Platelets: 311 10*3/uL (ref 150–400)
RBC: 3.46 MIL/uL — AB (ref 4.22–5.81)
RDW: 14.5 % (ref 11.5–15.5)
WBC: 15.6 10*3/uL — AB (ref 4.0–10.5)

## 2014-01-31 LAB — RENAL FUNCTION PANEL
ALBUMIN: 2.9 g/dL — AB (ref 3.5–5.2)
ANION GAP: 12 (ref 5–15)
BUN: 34 mg/dL — AB (ref 6–23)
CHLORIDE: 93 meq/L — AB (ref 96–112)
CO2: 29 mEq/L (ref 19–32)
Calcium: 9.4 mg/dL (ref 8.4–10.5)
Creatinine, Ser: 4.4 mg/dL — ABNORMAL HIGH (ref 0.50–1.35)
GFR calc Af Amer: 16 mL/min — ABNORMAL LOW (ref >90)
GFR calc non Af Amer: 13 mL/min — ABNORMAL LOW (ref >90)
GLUCOSE: 129 mg/dL — AB (ref 70–99)
Phosphorus: 3.9 mg/dL (ref 2.3–4.6)
Potassium: 4.1 mEq/L (ref 3.7–5.3)
Sodium: 134 mEq/L — ABNORMAL LOW (ref 137–147)

## 2014-01-31 LAB — GLUCOSE, CAPILLARY
GLUCOSE-CAPILLARY: 205 mg/dL — AB (ref 70–99)
Glucose-Capillary: 205 mg/dL — ABNORMAL HIGH (ref 70–99)

## 2014-01-31 MED ORDER — FLUTICASONE PROPIONATE 50 MCG/ACT NA SUSP: 2.0000 | Freq: Every day | NASAL | Status: DC

## 2014-01-31 MED ORDER — ALBUTEROL SULFATE HFA 108 (90 BASE) MCG/ACT IN AERS: 2.0000 | INHALATION_SPRAY | Freq: Four times a day (QID) | RESPIRATORY_TRACT | Status: DC | PRN

## 2014-01-31 MED ORDER — DM-GUAIFENESIN ER 30-600 MG PO TB12: 1.0000 | ORAL_TABLET | Freq: Two times a day (BID) | ORAL | Status: DC

## 2014-01-31 MED ORDER — LEVOFLOXACIN 250 MG PO TABS: 250.0000 mg | ORAL_TABLET | ORAL | Status: DC

## 2014-01-31 MED ORDER — WARFARIN SODIUM 3 MG PO TABS: 3.0000 mg | ORAL_TABLET | ORAL | Status: DC

## 2014-01-31 MED ORDER — PREDNISONE 10 MG PO TABS: ORAL_TABLET | ORAL | Status: DC

## 2014-01-31 MED ORDER — LORATADINE 10 MG PO TABS: 10.0000 mg | ORAL_TABLET | Freq: Every day | ORAL | Status: DC

## 2014-01-31 MED ORDER — SALINE SPRAY 0.65 % NA SOLN: 1.0000 | NASAL | Status: DC | PRN

## 2014-01-31 NOTE — Progress Notes (Signed)
[facility] Progress Note  Assessment/Plan: 1. Acute hypoxic resp failure- resolved- secondary to volume/ prob bronchitis tx levaquin- resp virus panel pending; still on droplet precautions; sats ok on room air 2. ESRD - MWF HD Sunday on holiday schedule - lowering edw, this is third HD in a row 3. Anemia - Hgb 11 stable 4. Secondary hyperparathyroidism - Ca/P ok 5. HTN/volume - irbesartan/coreg/decreasing EDW UF 3.1 Fri, 3.1 Sat and goal today 3.5- will have a much  lower edw at d/c 6. Nutrition - change to renal carb mod, alb stable 7. Afib - on amio, coreg, coumadin - INR threaperutic 8. DM - BSD 150 - 265 9. Hyponatremia improving with volume removal  Myriam Jacobson, PA-C [place] [phone] 01/31/2014,8:40 AM  LOS: 2 days   Pt seen, examined and agree w A/P as above. Possible dc today after HD.  Dry wt will be lowered.  Kelly Splinter MD pager [phone]    cell [phone] 01/31/2014, 11:18 AM    Subjective:     Objective Filed Vitals:   01/31/14 0542 01/31/14 0748 01/31/14 0753 01/31/14 0830  BP: 107/55 143/88 139/80 143/80  Pulse: 91 89 84 92  Temp: 98.3 F (36.8 C) 98.1 F (36.7 C)    TempSrc: Oral Oral    Resp: 18 20 19 20   Height:      Weight:  116.7 kg (257 lb 4.4 oz)    SpO2: 93% 99%     Physical Exam General: NAD on room air Heart: irreg irreg Lungs: no rales, wheezes or rhonchi Abdomen: obese soft NT Extremities:tr LE edema (improved by pt report) Dialysis Access: left AVF patent  Dialysis Orders:  [facility] 4h 450/800 121kg 2/2.25 [facility] LFA AVF Heparin none Hectorol 3 ug TIW  Additional Objective Labs: Lab Results  Component Value Date   INR 3.35* 01/31/2014   INR 3.06* 01/30/2014   INR 2.74* [id number]    Basic Metabolic Panel:  Recent Labs Lab 01/29/14 0747 01/29/14 1059 01/30/14 0509 01/31/14 0756  NA 129*  --  130* 134*  K 3.8  --  4.4 4.1  CL 85*  --  90* 93*  CO2 30  --  26 29   GLUCOSE 141*  --  132* 129*  BUN 36*  --  29* 34*  CREATININE 5.70* 5.92* 4.60* 4.40*  CALCIUM 9.7  --  9.1 9.4  PHOS  --   --   --  3.9   Liver Function Tests:  Recent Labs Lab 01/29/14 1100 01/30/14 0509 01/31/14 0756  AST 19 20  --   ALT 14 15  --   ALKPHOS 137* 142*  --   BILITOT 0.3 0.2*  --   PROT 7.4 7.5  --   ALBUMIN 3.0* 2.8* 2.9*   CBC:  Recent Labs Lab 01/29/14 0747 01/29/14 1059 01/30/14 0509 01/31/14 0757  WBC 11.0* 11.8* 12.8* 15.6*  NEUTROABS 8.1*  --   --   --   HGB 10.0* 9.9* 10.3* 11.0*  HCT 32.0* 31.4* 32.9* 33.7*  MCV 97.6 97.8 96.2 97.4  PLT 219 236 249 311  Cardiac Enzymes:  Recent Labs Lab 01/29/14 1059  TROPONINI <0.30   CBG:  Recent Labs Lab 01/29/14 1627 01/30/14 0805 01/30/14 1230 01/30/14 1726 01/30/14 2056  GLUCAP 126* 226* 154* 212* 265*  Medications:   . allopurinol  100 mg Oral BID  . amiodarone  200 mg Oral Daily  . amitriptyline  50 mg Oral QHS  .  carvedilol  25 mg Oral BID WC  . dextromethorphan-guaiFENesin  1 tablet Oral BID  . docusate sodium  100 mg Oral BID  . fluticasone  2 spray Each Nare Daily  . insulin aspart  0-9 Units Subcutaneous TID WC  . irbesartan  75 mg Oral Once per day on Sun Tue Sat  . [START ON 02/01/2014] levofloxacin  250 mg Oral Q48H  . loratadine  10 mg Oral Daily  . neomycin-polymyxin-hydrocortisone  4 drop Right Ear QID  . oxymetazoline  1 spray Each Nare BID  . predniSONE  40 mg Oral Q breakfast  . sevelamer carbonate  1,600 mg Oral TID WC  . simvastatin  20 mg Oral Daily  . sodium chloride  3 mL Intravenous Q12H  . sodium chloride  3 mL Intravenous Q12H  . warfarin  3 mg Oral Once per day on Sun Mon Tue Wed Fri Sat  . [START ON 02/04/2014] warfarin  4.5 mg Oral Once per day on Thu  . Warfarin - Pharmacist Dosing Inpatient   Does not apply q1800

## 2014-01-31 NOTE — Progress Notes (Signed)
Discharge instructions gone over with patient. Prescriptions given. Follow up appointment to be made. Home medications gone over with patient. Specific instructions given on antibiotic, steroids, and anticoagulant therapy. Diet discussed. Reasons to call the doctor and or 911 discussed. Specific weight gain is to be reported. Patient verbalized understanding of instructions and has demonstrated knowledge of taking medications.

## 2014-01-31 NOTE — Discharge Summary (Signed)
PATIENT DETAILS Name: Daniel Whitney Age: 58 y.o. Sex: male Date of Birth: [birth year] MRN: [id number]. Admitting Physician: Jonetta Osgood, MD [id number] Jenny Reichmann, MD  Admit Date: 01/29/2014 Discharge date: 01/31/2014  Recommendations for Outpatient Follow-up:  1. Please check INR in 2 days-and adjust dosing of coumadin 2. Note- Patient instructed to hold coumadin on 12/20, and then resume coumadin on 12/21 but take 1.5 mg of coumadin, and on 12/22 to resume usual dosing of coumadin 3. Resp virus panel pending-please follow-would not change management even if any component comes back positive-as symptoms ongoing for a 1 week and already clinically improved 4. Not on CPAP as an outpatient-claims he never followed up on sleep studies. Defer to PCP   PRIMARY DISCHARGE DIAGNOSIS:  Active Problems:   Acute Resp Failure   Acute Bronchitis      PAST MEDICAL HISTORY: Past Medical History  Diagnosis Date  . Glaucoma   . Hypertension   . Hyperlipidemia   . Atrial fibrillation 12/30/2012  . CHF (congestive heart failure) 12/30/2012  . Diabetic retinopathy 12/30/2012  . Morbid obesity 12/30/2012  . Pulmonary hypertension 12/30/2012  . Myocardial infarction     "I've had ~ 3; last one was in ~ 10/2013" (01/30/2014)  . Diabetes mellitus with neuropathy   . OSA (obstructive sleep apnea) 12/30/2012    "lost weight; no longer needed CPAP; retested said I needed it; didn't followup cause I was feeling fine" (01/30/2014)  . GERD (gastroesophageal reflux disease)   . Stroke ~ 2005; ~ 2005    "they were mild; I didn't even notice I'd had them"; denies residual on 01/30/2014  . History of gout     "right big toe"  . Arthritis     "all over"  . Chronic lower back pain   . ESRD (end stage renal disease) on dialysis started in 2013    "MWF; [facility]; [address]" (01/30/2014)  . Refusal of blood transfusions as patient is Jehovah's Witness     DISCHARGE MEDICATIONS: Current Discharge  Medication List    START taking these medications   Details  albuterol (PROVENTIL HFA;VENTOLIN HFA) 108 (90 BASE) MCG/ACT inhaler Inhale 2 puffs into the lungs every 6 (six) hours as needed for wheezing or shortness of breath. Qty: 1 Inhaler, Refills: 0    dextromethorphan-guaiFENesin (MUCINEX DM) 30-600 MG per 12 hr tablet Take 1 tablet by mouth 2 (two) times daily. Qty: 10 tablet, Refills: 0    fluticasone (FLONASE) 50 MCG/ACT nasal spray Place 2 sprays into both nostrils daily. Qty: 16 g, Refills: 2    levofloxacin (LEVAQUIN) 250 MG tablet Take 1 tablet (250 mg total) by mouth every other day. Qty: 2 tablet, Refills: 0    loratadine (CLARITIN) 10 MG tablet Take 1 tablet (10 mg total) by mouth daily. Qty: 5 tablet, Refills: 0    sodium chloride (OCEAN) 0.65 % SOLN nasal spray Place 1 spray into both nostrils as needed for congestion. Qty: 104 mL, Refills: 0      CONTINUE these medications which have CHANGED   Details  predniSONE (DELTASONE) 10 MG tablet Take 4 tablets (40 mg) daily for 2 days, then, Take 3 tablets (30 mg) daily for 2 days, then, Take 2 tablets (20 mg) daily for 2 days, then, Take 1 tablets (10 mg) daily for 1 days, then stop Qty: 19 tablet, Refills: 0    warfarin (COUMADIN) 3 MG tablet Take 1-1.5 tablets (3-4.5 mg total) by mouth See admin instructions. Please hold Coumadin 01/31/14-take  0.5 tablet on 02/01/14-and then resume usual dosing of on Thursday 1.5 tablet and on the remaining days you take 1 tabletTake as directed by anticoagulation clinic. Please make sure you check your INR in the next 2 days Qty: 20 tablet, Refills: 0      CONTINUE these medications which have NOT CHANGED   Details  allopurinol (ZYLOPRIM) 100 MG tablet Take 1 tablet (100 mg total) by mouth 2 (two) times daily. Qty: 180 tablet, Refills: 1    amiodarone (PACERONE) 200 MG tablet Take 1 tablet (200 mg total) by mouth daily. Qty: 90 tablet, Refills: 1    amitriptyline (ELAVIL) 50  MG tablet Take 1 tablet (50 mg total) by mouth at bedtime. Qty: 90 tablet, Refills: 1    Ascorbic Acid (VITAMIN C) 1000 MG tablet Take 1,000 mg by mouth daily.    b complex-vitamin c-folic acid (NEPHRO-VITE) 0.8 MG TABS tablet Take 1 tablet by mouth daily.    carvedilol (COREG) 25 MG tablet Take 25 mg by mouth 2 (two) times daily with a meal.    clindamycin (CLEOCIN T) 1 % lotion Apply topically 2 (two) times daily. Qty: 60 mL, Refills: 0    Eflornithine HCl (VANIQA) 13.9 % cream Apply 1 application topically 2 (two) times daily with a meal.     Multiple Vitamin (MULTIVITAMIN WITH MINERALS) TABS tablet Take 1 tablet by mouth daily.    !! OVER THE COUNTER MEDICATION 2 (two) times daily. Benzaderm Wash    !! OVER THE COUNTER MEDICATION 1 application 2 (two) times daily. Melaquin Complex    PRESCRIPTION MEDICATION every Monday, Wednesday, and Friday. Dialysis    sevelamer carbonate (RENVELA) 800 MG tablet Take 1,600 mg by mouth 3 (three) times daily with meals.     simvastatin (ZOCOR) 20 MG tablet Take 1 tablet (20 mg total) by mouth daily. Qty: 90 tablet, Refills: 1    sitaGLIPtin (JANUVIA) 25 MG tablet Take 1 tablet (25 mg total) by mouth daily. Qty: 90 tablet, Refills: 3    tadalafil (CIALIS) 20 MG tablet Take 1 tablet (20 mg total) by mouth daily as needed for erectile dysfunction. Qty: 5 tablet, Refills: 11    traMADol (ULTRAM ER) 300 MG 24 hr tablet Take 1 tablet (300 mg total) by mouth daily. Qty: 90 tablet, Refills: 1    valsartan (DIOVAN) 40 MG tablet Take 40 mg by mouth 3 (three) times a week. Takes on Sunday, Tuesday, and Saturday    neomycin-polymyxin-hydrocortisone (CORTISPORIN) otic solution Place 4 drops into the right ear 4 (four) times daily. Qty: 10 mL, Refills: 0    ondansetron (ZOFRAN) 4 MG tablet Take 1 tablet (4 mg total) by mouth every 6 (six) hours. Qty: 12 tablet, Refills: 0     !! - Potential duplicate medications found. Please discuss with provider.     STOP taking these medications     ciprofloxacin (CIPRO) 250 MG tablet         ALLERGIES:  No Known Allergies  BRIEF HPI:  See H&P, Labs, Consult and Test reports for all details in brief, patient is a 58 year old male with a history of end-stage renal disease on hemodialysis Monday Wednesday Friday, atrial fibrillation, dyslipidemia, diabetes who presented to the ER with shortness of breath.  CONSULTATIONS:   nephrology  PERTINENT RADIOLOGIC STUDIES: Dg Chest Portable 1 View  01/29/2014   CLINICAL DATA:  Shortness of Breath  EXAM: PORTABLE CHEST - 1 VIEW  COMPARISON:  06/18/2013  FINDINGS: Borderline cardiomegaly. Central mild  vascular congestion without convincing pulmonary edema. Study is limited by poor inspiration. Bilateral basilar atelectasis. No segmental infiltrate.  IMPRESSION: Central mild vascular congestion without convincing pulmonary edema. Poor inspiration with bilateral basilar atelectasis. No segmental infiltrates.   Electronically Signed   By: Lahoma Crocker M.D.   On: 01/29/2014 08:14     PERTINENT LAB RESULTS: CBC:  Recent Labs  01/30/14 0509 01/31/14 0757  WBC 12.8* 15.6*  HGB 10.3* 11.0*  HCT 32.9* 33.7*  PLT 249 311   CMET CMP     Component Value Date/Time   NA 134* 01/31/2014 0756   K 4.1 01/31/2014 0756   CL 93* 01/31/2014 0756   CO2 29 01/31/2014 0756   GLUCOSE 129* 01/31/2014 0756   BUN 34* 01/31/2014 0756   CREATININE 4.40* 01/31/2014 0756   CALCIUM 9.4 01/31/2014 0756   PROT 7.5 01/30/2014 0509   ALBUMIN 2.9* 01/31/2014 0756   AST 20 01/30/2014 0509   ALT 15 01/30/2014 0509   ALKPHOS 142* 01/30/2014 0509   BILITOT 0.2* 01/30/2014 0509   GFRNONAA 13* 01/31/2014 0756   GFRAA 16* 01/31/2014 0756    GFR Estimated Creatinine Clearance: 22.5 mL/min (by C-G formula based on Cr of 4.4). No results for input(s): LIPASE, AMYLASE in the last 72 hours.  Recent Labs  01/29/14 1059  TROPONINI <0.30   Invalid input(s): POCBNP No  results for input(s): DDIMER in the last 72 hours.  Recent Labs  01/29/14 1059  HGBA1C 6.5*   No results for input(s): CHOL, HDL, LDLCALC, TRIG, CHOLHDL, LDLDIRECT in the last 72 hours. No results for input(s): TSH, T4TOTAL, T3FREE, THYROIDAB in the last 72 hours.  Invalid input(s): FREET3 No results for input(s): VITAMINB12, FOLATE, FERRITIN, TIBC, IRON, RETICCTPCT in the last 72 hours. Coags:  Recent Labs  01/30/14 1218 01/31/14 0513  INR 3.06* 3.35*   Microbiology: Recent Results (from the past 240 hour(s))  MRSA PCR Screening     Status: None   Collection Time: 01/29/14  6:56 PM  Result Value Ref Range Status   MRSA by PCR NEGATIVE NEGATIVE Final    Comment:        The GeneXpert MRSA Assay (FDA approved for NASAL specimens only), is one component of a comprehensive MRSA colonization surveillance program. It is not intended to diagnose MRSA infection nor to guide or monitor treatment for MRSA infections.      BRIEF HOSPITAL COURSE:  Acute hypoxic Resp Failure: Suspect acute bronchitis/pneumonitis with some volume overload as the etiology. Started tapering prednisone, empiric Levaquin,  nebulized bronchodilators.Underwent extra HD on 12/19, with these measures, much improved. Requests discharge, should be stable to discharge home with the above noted medications. Also with some nasal congestion-started on  nasal decongestant spray and Flonase along with a antihistamine which will be continued for a few more days.  Suspected Acute Bronchitis:see above. Await resp virus panel.  ESRD: On hemodialysis MWF. Followed by [facility] during this hospital stay.  History of atrial fibrillation: Rate controlled with amiodarone and Coreg, on Coumadin-INR supra-therapeuttic on discharge-have instructed patient to hold coumadin on 12/20-start 1.5 mg on 12/21 and from 12/22 to resume usual dosing. He is aware he needs INR check in next 2-3 days.  History of hypertension: Controlled with  Coreg and Avapro  History of chronic diastolic heart failure: Volume removal during hemodialysis.  History of pulmonary hypertension: Suspect secondary to OSA.  History of OSA: Not on CPAP as an outpatient-claims he never followed up on sleep studies. Defer to the  outpatient setting  History of type 2 diabetes: resume oral hypoglycemic agents on discharge  TODAY-DAY OF DISCHARGE:  Subjective:   Reece Agar today has no headache,no chest abdominal pain,no new weakness tingling or numbness, feels much better wants to go home today.   Objective:   Blood pressure 128/62, pulse 88, temperature 98.2 F (36.8 C), temperature source Oral, resp. rate 18, height 5\' 9"  (1.753 m), weight 114.2 kg (251 lb 12.3 oz), SpO2 98 %.  Intake/Output Summary (Last 24 hours) at 01/31/14 1447 Last data filed at 01/31/14 1200  Gross per 24 hour  Intake    240 ml  Output   6115 ml  Net  -5875 ml   Filed Weights   01/30/14 2055 01/31/14 0748 01/31/14 1200  Weight: 115.5 kg (254 lb 10.1 oz) 116.7 kg (257 lb 4.4 oz) 114.2 kg (251 lb 12.3 oz)    Exam Awake Alert, Oriented *3, No new F.N deficits, Normal affect [place].AT,PERRAL Supple Neck,No JVD, No cervical lymphadenopathy appriciated.  Symmetrical Chest wall movement, Good air movement bilaterally, CTAB RRR,No Gallops,Rubs or new Murmurs, No Parasternal Heave +ve B.Sounds, Abd Soft, Non tender, No organomegaly appriciated, No rebound -guarding or rigidity. No Cyanosis, Clubbing or edema, No new Rash or bruise  DISCHARGE CONDITION: Stable  DISPOSITION: Home  DISCHARGE INSTRUCTIONS:    Activity:  As tolerated   Diet recommendation: Diabetic Diet Heart Healthy diet  Discharge Instructions    Call MD for:  difficulty breathing, headache or visual disturbances    Complete by:  As directed      Diet - low sodium heart healthy    Complete by:  As directed      Diet Carb Modified    Complete by:  As directed      Increase activity slowly     Complete by:  As directed            Follow-up Information    Follow up with Cathlean Cower, MD. Schedule an appointment as soon as possible for a visit in 1 week.   Specialties:  Internal Medicine, Radiology   Contact information:   [place] [address] [phone]       Please follow up.   Contact information:   Hemodialysis Clinic at your usual schedule.     Total Time spent on discharge equals 45 minutes.  SignedOren Binet 01/31/2014 2:47 PM

## 2014-01-31 NOTE — Discharge Instructions (Signed)
Note- Patient instructed to hold coumadin on 12/20, and then resume coumadin on 12/21 but take 1.5 mg of coumadin, and on 12/22 to resume usual dosing of coumadin

## 2014-02-02 LAB — RESPIRATORY VIRUS PANEL
Adenovirus: NOT DETECTED
Influenza A H1: NOT DETECTED
Influenza A H3: NOT DETECTED
Influenza A: NOT DETECTED
Influenza B: NOT DETECTED
Metapneumovirus: NOT DETECTED
PARAINFLUENZA 1 A: NOT DETECTED
PARAINFLUENZA 2 A: NOT DETECTED
PARAINFLUENZA 3 A: NOT DETECTED
RHINOVIRUS: NOT DETECTED
Respiratory Syncytial Virus A: DETECTED — AB
Respiratory Syncytial Virus B: NOT DETECTED

## 2014-02-03 DIAGNOSIS — I4891 Unspecified atrial fibrillation: Secondary | ICD-10-CM | POA: Diagnosis not present

## 2014-02-11 ENCOUNTER — Ambulatory Visit: Payer: Medicare Other | Admitting: Family Medicine

## 2014-02-11 DIAGNOSIS — Z992 Dependence on renal dialysis: Secondary | ICD-10-CM | POA: Diagnosis not present

## 2014-02-11 DIAGNOSIS — N186 End stage renal disease: Secondary | ICD-10-CM | POA: Diagnosis not present

## 2014-02-15 DIAGNOSIS — N186 End stage renal disease: Secondary | ICD-10-CM | POA: Diagnosis not present

## 2014-02-15 DIAGNOSIS — D631 Anemia in chronic kidney disease: Secondary | ICD-10-CM | POA: Diagnosis not present

## 2014-02-17 DIAGNOSIS — D631 Anemia in chronic kidney disease: Secondary | ICD-10-CM | POA: Diagnosis not present

## 2014-02-17 DIAGNOSIS — N186 End stage renal disease: Secondary | ICD-10-CM | POA: Diagnosis not present

## 2014-02-18 ENCOUNTER — Ambulatory Visit: Payer: Medicare Other | Admitting: Internal Medicine

## 2014-02-19 DIAGNOSIS — D631 Anemia in chronic kidney disease: Secondary | ICD-10-CM | POA: Diagnosis not present

## 2014-02-19 DIAGNOSIS — N186 End stage renal disease: Secondary | ICD-10-CM | POA: Diagnosis not present

## 2014-02-22 DIAGNOSIS — D631 Anemia in chronic kidney disease: Secondary | ICD-10-CM | POA: Diagnosis not present

## 2014-02-22 DIAGNOSIS — N186 End stage renal disease: Secondary | ICD-10-CM | POA: Diagnosis not present

## 2014-02-24 DIAGNOSIS — N186 End stage renal disease: Secondary | ICD-10-CM | POA: Diagnosis not present

## 2014-02-24 DIAGNOSIS — D631 Anemia in chronic kidney disease: Secondary | ICD-10-CM | POA: Diagnosis not present

## 2014-03-02 ENCOUNTER — Ambulatory Visit: Payer: Medicare Other | Admitting: Family Medicine

## 2014-03-03 ENCOUNTER — Ambulatory Visit: Payer: Medicare Other | Admitting: Family Medicine

## 2014-03-03 ENCOUNTER — Telehealth: Payer: Self-pay | Admitting: *Deleted

## 2014-03-03 DIAGNOSIS — N186 End stage renal disease: Secondary | ICD-10-CM | POA: Diagnosis not present

## 2014-03-03 DIAGNOSIS — D631 Anemia in chronic kidney disease: Secondary | ICD-10-CM | POA: Diagnosis not present

## 2014-03-03 NOTE — Telephone Encounter (Signed)
[place] Night - Client [facility] Patient Name: Daniel Whitney [facility] Gender: Male DOB: [birth year] Age: 59 Y 33 M 17 D Return Phone Number: [phone] (Primary) Address: [address] City/State/Zip: [place] [address] Client [facility] Night - Client Client Site [place] - Night Contact Type Call Caller Name Uber Barwick Phone Number [phone] Relationship To Patient Self Is this call to report lab results? No Call Type General Information Initial Comment Caller states, Needs to cancel appt due to weather, General Information Type Appointment Nurse Assessment Guidelines Guideline Title Affirmed Question Affirmed Notes Nurse Date/Time (Eastern Time) Disp. Time Eilene Ghazi Time) Disposition Final User 03/03/2014 7:38:48 AM General Information Provided Yes Eather Colas After Care Instructions Given Call Event Type User Date / Time Description

## 2014-03-06 DIAGNOSIS — N186 End stage renal disease: Secondary | ICD-10-CM | POA: Diagnosis not present

## 2014-03-06 DIAGNOSIS — D631 Anemia in chronic kidney disease: Secondary | ICD-10-CM | POA: Diagnosis not present

## 2014-03-08 DIAGNOSIS — N186 End stage renal disease: Secondary | ICD-10-CM | POA: Diagnosis not present

## 2014-03-08 DIAGNOSIS — D631 Anemia in chronic kidney disease: Secondary | ICD-10-CM | POA: Diagnosis not present

## 2014-03-10 DIAGNOSIS — N186 End stage renal disease: Secondary | ICD-10-CM | POA: Diagnosis not present

## 2014-03-10 DIAGNOSIS — I4891 Unspecified atrial fibrillation: Secondary | ICD-10-CM | POA: Diagnosis not present

## 2014-03-10 DIAGNOSIS — D631 Anemia in chronic kidney disease: Secondary | ICD-10-CM | POA: Diagnosis not present

## 2014-03-11 LAB — POCT INR: INR: 4.01

## 2014-03-12 DIAGNOSIS — N186 End stage renal disease: Secondary | ICD-10-CM | POA: Diagnosis not present

## 2014-03-12 DIAGNOSIS — D631 Anemia in chronic kidney disease: Secondary | ICD-10-CM | POA: Diagnosis not present

## 2014-03-14 DIAGNOSIS — Z992 Dependence on renal dialysis: Secondary | ICD-10-CM | POA: Diagnosis not present

## 2014-03-14 DIAGNOSIS — N186 End stage renal disease: Secondary | ICD-10-CM | POA: Diagnosis not present

## 2014-03-15 DIAGNOSIS — D509 Iron deficiency anemia, unspecified: Secondary | ICD-10-CM | POA: Diagnosis not present

## 2014-03-15 DIAGNOSIS — N186 End stage renal disease: Secondary | ICD-10-CM | POA: Diagnosis not present

## 2014-03-15 DIAGNOSIS — N2581 Secondary hyperparathyroidism of renal origin: Secondary | ICD-10-CM | POA: Diagnosis not present

## 2014-03-15 DIAGNOSIS — D631 Anemia in chronic kidney disease: Secondary | ICD-10-CM | POA: Diagnosis not present

## 2014-03-16 ENCOUNTER — Telehealth: Payer: Self-pay | Admitting: Family

## 2014-03-16 ENCOUNTER — Ambulatory Visit (INDEPENDENT_AMBULATORY_CARE_PROVIDER_SITE_OTHER): Payer: Medicare Other | Admitting: General Practice

## 2014-03-16 DIAGNOSIS — Z5181 Encounter for therapeutic drug level monitoring: Secondary | ICD-10-CM

## 2014-03-16 DIAGNOSIS — I639 Cerebral infarction, unspecified: Secondary | ICD-10-CM

## 2014-03-16 DIAGNOSIS — I272 Pulmonary hypertension, unspecified: Secondary | ICD-10-CM

## 2014-03-16 DIAGNOSIS — I270 Primary pulmonary hypertension: Secondary | ICD-10-CM

## 2014-03-16 NOTE — Progress Notes (Signed)
Results faxed on 1/28.  Called patient and got answering machine.  Left message to call Villa Herb, RN @ [phone].

## 2014-03-16 NOTE — Telephone Encounter (Signed)
Agree with plan. When is patient's next dialysis as he may need to skip a dose of medication.?

## 2014-03-16 NOTE — Progress Notes (Signed)
Pre visit review using our clinic review tool, if applicable. No additional management support is needed unless otherwise documented below in the visit note. 

## 2014-03-17 DIAGNOSIS — D631 Anemia in chronic kidney disease: Secondary | ICD-10-CM | POA: Diagnosis not present

## 2014-03-17 DIAGNOSIS — D509 Iron deficiency anemia, unspecified: Secondary | ICD-10-CM | POA: Diagnosis not present

## 2014-03-17 DIAGNOSIS — N2581 Secondary hyperparathyroidism of renal origin: Secondary | ICD-10-CM | POA: Diagnosis not present

## 2014-03-17 DIAGNOSIS — N186 End stage renal disease: Secondary | ICD-10-CM | POA: Diagnosis not present

## 2014-03-17 NOTE — Progress Notes (Signed)
Agree with plan 

## 2014-03-20 DIAGNOSIS — N2581 Secondary hyperparathyroidism of renal origin: Secondary | ICD-10-CM | POA: Diagnosis not present

## 2014-03-20 DIAGNOSIS — D631 Anemia in chronic kidney disease: Secondary | ICD-10-CM | POA: Diagnosis not present

## 2014-03-20 DIAGNOSIS — N186 End stage renal disease: Secondary | ICD-10-CM | POA: Diagnosis not present

## 2014-03-20 DIAGNOSIS — D509 Iron deficiency anemia, unspecified: Secondary | ICD-10-CM | POA: Diagnosis not present

## 2014-03-22 DIAGNOSIS — D509 Iron deficiency anemia, unspecified: Secondary | ICD-10-CM | POA: Diagnosis not present

## 2014-03-22 DIAGNOSIS — D631 Anemia in chronic kidney disease: Secondary | ICD-10-CM | POA: Diagnosis not present

## 2014-03-22 DIAGNOSIS — N186 End stage renal disease: Secondary | ICD-10-CM | POA: Diagnosis not present

## 2014-03-22 DIAGNOSIS — N2581 Secondary hyperparathyroidism of renal origin: Secondary | ICD-10-CM | POA: Diagnosis not present

## 2014-03-24 DIAGNOSIS — D509 Iron deficiency anemia, unspecified: Secondary | ICD-10-CM | POA: Diagnosis not present

## 2014-03-24 DIAGNOSIS — N2581 Secondary hyperparathyroidism of renal origin: Secondary | ICD-10-CM | POA: Diagnosis not present

## 2014-03-24 DIAGNOSIS — D631 Anemia in chronic kidney disease: Secondary | ICD-10-CM | POA: Diagnosis not present

## 2014-03-24 DIAGNOSIS — N186 End stage renal disease: Secondary | ICD-10-CM | POA: Diagnosis not present

## 2014-03-27 DIAGNOSIS — D631 Anemia in chronic kidney disease: Secondary | ICD-10-CM | POA: Diagnosis not present

## 2014-03-27 DIAGNOSIS — N186 End stage renal disease: Secondary | ICD-10-CM | POA: Diagnosis not present

## 2014-03-30 DIAGNOSIS — D631 Anemia in chronic kidney disease: Secondary | ICD-10-CM | POA: Diagnosis not present

## 2014-03-30 DIAGNOSIS — N186 End stage renal disease: Secondary | ICD-10-CM | POA: Diagnosis not present

## 2014-04-02 DIAGNOSIS — N186 End stage renal disease: Secondary | ICD-10-CM | POA: Diagnosis not present

## 2014-04-02 DIAGNOSIS — N2581 Secondary hyperparathyroidism of renal origin: Secondary | ICD-10-CM | POA: Diagnosis not present

## 2014-04-02 DIAGNOSIS — D509 Iron deficiency anemia, unspecified: Secondary | ICD-10-CM | POA: Diagnosis not present

## 2014-04-02 DIAGNOSIS — D631 Anemia in chronic kidney disease: Secondary | ICD-10-CM | POA: Diagnosis not present

## 2014-04-05 DIAGNOSIS — N186 End stage renal disease: Secondary | ICD-10-CM | POA: Diagnosis not present

## 2014-04-05 DIAGNOSIS — D509 Iron deficiency anemia, unspecified: Secondary | ICD-10-CM | POA: Diagnosis not present

## 2014-04-05 DIAGNOSIS — N2581 Secondary hyperparathyroidism of renal origin: Secondary | ICD-10-CM | POA: Diagnosis not present

## 2014-04-05 DIAGNOSIS — D631 Anemia in chronic kidney disease: Secondary | ICD-10-CM | POA: Diagnosis not present

## 2014-04-06 ENCOUNTER — Ambulatory Visit: Payer: Medicare Other | Admitting: Family Medicine

## 2014-04-07 DIAGNOSIS — N2581 Secondary hyperparathyroidism of renal origin: Secondary | ICD-10-CM | POA: Diagnosis not present

## 2014-04-07 DIAGNOSIS — D631 Anemia in chronic kidney disease: Secondary | ICD-10-CM | POA: Diagnosis not present

## 2014-04-07 DIAGNOSIS — N186 End stage renal disease: Secondary | ICD-10-CM | POA: Diagnosis not present

## 2014-04-07 DIAGNOSIS — D509 Iron deficiency anemia, unspecified: Secondary | ICD-10-CM | POA: Diagnosis not present

## 2014-04-07 DIAGNOSIS — I4891 Unspecified atrial fibrillation: Secondary | ICD-10-CM | POA: Diagnosis not present

## 2014-04-08 LAB — PROTIME-INR: INR: 2.7 — AB (ref <=1.1)

## 2014-04-09 DIAGNOSIS — N2581 Secondary hyperparathyroidism of renal origin: Secondary | ICD-10-CM | POA: Diagnosis not present

## 2014-04-09 DIAGNOSIS — N186 End stage renal disease: Secondary | ICD-10-CM | POA: Diagnosis not present

## 2014-04-09 DIAGNOSIS — D631 Anemia in chronic kidney disease: Secondary | ICD-10-CM | POA: Diagnosis not present

## 2014-04-09 DIAGNOSIS — D509 Iron deficiency anemia, unspecified: Secondary | ICD-10-CM | POA: Diagnosis not present

## 2014-04-12 ENCOUNTER — Ambulatory Visit (INDEPENDENT_AMBULATORY_CARE_PROVIDER_SITE_OTHER): Payer: PRIVATE HEALTH INSURANCE | Admitting: General Practice

## 2014-04-12 DIAGNOSIS — N186 End stage renal disease: Secondary | ICD-10-CM | POA: Diagnosis not present

## 2014-04-12 DIAGNOSIS — N2581 Secondary hyperparathyroidism of renal origin: Secondary | ICD-10-CM | POA: Diagnosis not present

## 2014-04-12 DIAGNOSIS — D631 Anemia in chronic kidney disease: Secondary | ICD-10-CM | POA: Diagnosis not present

## 2014-04-12 DIAGNOSIS — Z5181 Encounter for therapeutic drug level monitoring: Secondary | ICD-10-CM

## 2014-04-12 DIAGNOSIS — D509 Iron deficiency anemia, unspecified: Secondary | ICD-10-CM | POA: Diagnosis not present

## 2014-04-12 DIAGNOSIS — I272 Pulmonary hypertension, unspecified: Secondary | ICD-10-CM

## 2014-04-12 DIAGNOSIS — I639 Cerebral infarction, unspecified: Secondary | ICD-10-CM

## 2014-04-12 DIAGNOSIS — Z992 Dependence on renal dialysis: Secondary | ICD-10-CM | POA: Diagnosis not present

## 2014-04-12 DIAGNOSIS — I270 Primary pulmonary hypertension: Secondary | ICD-10-CM

## 2014-04-12 NOTE — Progress Notes (Signed)
Pre visit review using our clinic review tool, if applicable. No additional management support is needed unless otherwise documented below in the visit note. 

## 2014-04-14 DIAGNOSIS — D631 Anemia in chronic kidney disease: Secondary | ICD-10-CM | POA: Diagnosis not present

## 2014-04-14 DIAGNOSIS — N2581 Secondary hyperparathyroidism of renal origin: Secondary | ICD-10-CM | POA: Diagnosis not present

## 2014-04-14 DIAGNOSIS — D509 Iron deficiency anemia, unspecified: Secondary | ICD-10-CM | POA: Diagnosis not present

## 2014-04-14 DIAGNOSIS — N186 End stage renal disease: Secondary | ICD-10-CM | POA: Diagnosis not present

## 2014-04-16 DIAGNOSIS — N2581 Secondary hyperparathyroidism of renal origin: Secondary | ICD-10-CM | POA: Diagnosis not present

## 2014-04-16 DIAGNOSIS — D509 Iron deficiency anemia, unspecified: Secondary | ICD-10-CM | POA: Diagnosis not present

## 2014-04-16 DIAGNOSIS — D631 Anemia in chronic kidney disease: Secondary | ICD-10-CM | POA: Diagnosis not present

## 2014-04-16 DIAGNOSIS — N186 End stage renal disease: Secondary | ICD-10-CM | POA: Diagnosis not present

## 2014-04-19 DIAGNOSIS — D631 Anemia in chronic kidney disease: Secondary | ICD-10-CM | POA: Diagnosis not present

## 2014-04-19 DIAGNOSIS — N2581 Secondary hyperparathyroidism of renal origin: Secondary | ICD-10-CM | POA: Diagnosis not present

## 2014-04-19 DIAGNOSIS — N186 End stage renal disease: Secondary | ICD-10-CM | POA: Diagnosis not present

## 2014-04-19 DIAGNOSIS — D509 Iron deficiency anemia, unspecified: Secondary | ICD-10-CM | POA: Diagnosis not present

## 2014-04-20 ENCOUNTER — Encounter: Payer: Self-pay | Admitting: Family Medicine

## 2014-04-20 ENCOUNTER — Ambulatory Visit (INDEPENDENT_AMBULATORY_CARE_PROVIDER_SITE_OTHER): Payer: Medicare Other | Admitting: Family Medicine

## 2014-04-20 VITALS — BP 114/68 | HR 103 | Wt 267.0 lb

## 2014-04-20 DIAGNOSIS — M1710 Unilateral primary osteoarthritis, unspecified knee: Secondary | ICD-10-CM

## 2014-04-20 DIAGNOSIS — M25511 Pain in right shoulder: Secondary | ICD-10-CM

## 2014-04-20 DIAGNOSIS — I639 Cerebral infarction, unspecified: Secondary | ICD-10-CM

## 2014-04-20 NOTE — Patient Instructions (Addendum)
Good to see you!   Ice is still your friend Continue the exercises We will try to get you a brae today.  Did injection today and when I see you again in 3 week we can consider orthovisc and see if this helps.

## 2014-04-20 NOTE — Assessment & Plan Note (Signed)
Very concerning the patient's right shoulder pain secondary to rotator cuff tear.  Discuss ice, HEP and given handout.  Discuss possible imagaing and because this appears to be acute possible further imaging would be necessary because this could change management if patient would need surgical repair. Patient once again states that he would likely not have surgical repair saw do not know if this will be worn 2. We will discuss at follow-up in 2-3 weeks.

## 2014-04-20 NOTE — Assessment & Plan Note (Signed)
Patient was a difficult aspiration and injection today. I do think that patient will need ultrasound guided injections from here on out. Patient is going to start with to need conservative therapy because patient feels that he is not a surgical candidate and does not want to have replacement. Patient did respond very well to viscous supplementation and has been greater than 6 months. Patient continues to have pain he'll come back and we'll consider starting this. Patient given new medial unloader braces, these were helping as well. Encourage patient to do weight loss and continue the home exercises. We'll see patient back again in 2-3 weeks for further evaluation and management.

## 2014-04-20 NOTE — Progress Notes (Signed)
[name] [address] [place], [place] [address] Phone: [phone] Subjective:     CC: Bilateral knee pain follow up  [id number] Daniel Whitney is a 59 y.o. male patient is coming back for his bilateral knee pain.patient did finish this series of Synvisc injections greater than 6 months ago. Patient states it was doing significantly well and just started slowly coming back. Patient states that the pain is still the same as it was previously. Denies any significant worsening. Nothing that is causing him to stop his regular daily activities but is finding it difficult to walk long distances again. Patient does have end-stage osteophytes of the knees bilaterally. Patient is wondering what can be done.  Patient is also complaining of a new problem. Patient also is complaining of right shoulder pain. Has noticed weakness for approximate 6 weeks. Patient does her lumbar falling. Patient states since that time has had a dull throbbing aching pain in the shoulder. Now is having difficulty reaching above his head. Rates the discomfort is 7 out of 10. Denies any radiation of the arm or any numbness or tingling. States that it can be somewhat  Uncomfortable to raise his hand at night.       Past medical history, social, surgical and family history all reviewed in electronic medical record.   Review of Systems: No headache, visual changes, nausea, vomiting, diarrhea, constipation, dizziness, abdominal pain, skin rash, fevers, chills, night sweats, weight loss, swollen lymph nodes, body aches, joint swelling, muscle aches, chest pain, shortness of breath, mood changes.   Objective Blood pressure 114/68, pulse 103, weight 267 lb (121.11 kg), SpO2 92 %.  General: No apparent distress alert and oriented x3 mood and affect normal, dressed appropriately. Morbid obesity.  HEENT: Pupils equal, extraocular movements intact patient though does not respond well to light in the  right eye Respiratory: Patient's speak in full sentences and does not appear short of breath  Cardiovascular: No lower extremity edema, non tender, no erythema  Skin: Warm dry intact with no signs of infection or rash on extremities or on axial skeleton.  Abdomen: Soft nontender  Neuro: Cranial nerves II through XII are intact, neurovascularly intact in all extremities with 2+ DTRs and 2+ pulses.  Lymph: No lymphadenopathy of posterior or anterior cervical chain or axillae bilaterally.  Gait significant antalgic gait. MSK:  Non tender with full range of motion and good stability and symmetric strength and tone of , elbows, wrist,  and ankles bilaterally.   Shoulder: Inspection reveals no abnormalities, atrophy or asymmetry. Palpation is normal with no tenderness over AC joint or bicipital groove. ROM is full in all planes. Rotator cuff strength 3 out of 5 compared to 5 out of 5 on the contralateral side  signs of impingement with negative Neer and Hawkin's tests, empty can sign. Speeds and Yergason's tests normal. No labral pathology noted with negative Obrien's, negative clunk and good stability. Normal scapular function observed. No painful arc and no drop arm sign. No apprehension sign      Bilateral knee Exam shows the patient does have crepitus bilaterally and severe tenderness to the medial joint line. Patient does have full range of motion.  After informed written and verbal consent, patient was seated on exam table. Right knee was prepped with alcohol swab and utilizing anterolateral approach, patient's right knee space was injected with 4:1  marcaine 0.5%: Kenalog 40mg /dL. Patient tolerated the procedure well without immediate complications.  After informed written and  verbal consent, patient was seated on exam table. Left knee was prepped with alcohol swab and utilizing anterolateral approach, patient's left knee space was injected with 4:1  marcaine 0.5%: Kenalog 40mg /dL.  Patient tolerated the procedure well without immediate complications.    Impression and Recommendations:     This case required medical decision making of moderate complexity.

## 2014-04-20 NOTE — Progress Notes (Signed)
Pre visit review using our clinic review tool, if applicable. No additional management support is needed unless otherwise documented below in the visit note. 

## 2014-04-21 DIAGNOSIS — D509 Iron deficiency anemia, unspecified: Secondary | ICD-10-CM | POA: Diagnosis not present

## 2014-04-21 DIAGNOSIS — D631 Anemia in chronic kidney disease: Secondary | ICD-10-CM | POA: Diagnosis not present

## 2014-04-21 DIAGNOSIS — N2581 Secondary hyperparathyroidism of renal origin: Secondary | ICD-10-CM | POA: Diagnosis not present

## 2014-04-21 DIAGNOSIS — N186 End stage renal disease: Secondary | ICD-10-CM | POA: Diagnosis not present

## 2014-04-23 DIAGNOSIS — D509 Iron deficiency anemia, unspecified: Secondary | ICD-10-CM | POA: Diagnosis not present

## 2014-04-23 DIAGNOSIS — D631 Anemia in chronic kidney disease: Secondary | ICD-10-CM | POA: Diagnosis not present

## 2014-04-23 DIAGNOSIS — N2581 Secondary hyperparathyroidism of renal origin: Secondary | ICD-10-CM | POA: Diagnosis not present

## 2014-04-23 DIAGNOSIS — N186 End stage renal disease: Secondary | ICD-10-CM | POA: Diagnosis not present

## 2014-04-25 ENCOUNTER — Emergency Department (HOSPITAL_COMMUNITY)
Admission: EM | Admit: 2014-04-25 | Discharge: 2014-04-25 | Disposition: A | Discharge status: Home / Self Care | Payer: Medicare Other | Attending: Emergency Medicine | Admitting: Emergency Medicine

## 2014-04-25 ENCOUNTER — Emergency Department (HOSPITAL_COMMUNITY): Payer: Medicare Other

## 2014-04-25 ENCOUNTER — Encounter (HOSPITAL_COMMUNITY): Payer: Self-pay | Admitting: *Deleted

## 2014-04-25 DIAGNOSIS — Z79899 Other long term (current) drug therapy: Secondary | ICD-10-CM | POA: Diagnosis not present

## 2014-04-25 DIAGNOSIS — Z8673 Personal history of transient ischemic attack (TIA), and cerebral infarction without residual deficits: Secondary | ICD-10-CM | POA: Diagnosis not present

## 2014-04-25 DIAGNOSIS — W1839XA Other fall on same level, initial encounter: Secondary | ICD-10-CM | POA: Insufficient documentation

## 2014-04-25 DIAGNOSIS — E785 Hyperlipidemia, unspecified: Secondary | ICD-10-CM | POA: Diagnosis not present

## 2014-04-25 DIAGNOSIS — Y998 Other external cause status: Secondary | ICD-10-CM | POA: Diagnosis not present

## 2014-04-25 DIAGNOSIS — I120 Hypertensive chronic kidney disease with stage 5 chronic kidney disease or end stage renal disease: Secondary | ICD-10-CM | POA: Insufficient documentation

## 2014-04-25 DIAGNOSIS — M25511 Pain in right shoulder: Secondary | ICD-10-CM

## 2014-04-25 DIAGNOSIS — S4991XA Unspecified injury of right shoulder and upper arm, initial encounter: Secondary | ICD-10-CM | POA: Insufficient documentation

## 2014-04-25 DIAGNOSIS — Y9389 Activity, other specified: Secondary | ICD-10-CM | POA: Diagnosis not present

## 2014-04-25 DIAGNOSIS — Z7952 Long term (current) use of systemic steroids: Secondary | ICD-10-CM | POA: Insufficient documentation

## 2014-04-25 DIAGNOSIS — N186 End stage renal disease: Secondary | ICD-10-CM | POA: Diagnosis not present

## 2014-04-25 DIAGNOSIS — E1140 Type 2 diabetes mellitus with diabetic neuropathy, unspecified: Secondary | ICD-10-CM | POA: Diagnosis not present

## 2014-04-25 DIAGNOSIS — Z992 Dependence on renal dialysis: Secondary | ICD-10-CM | POA: Diagnosis not present

## 2014-04-25 DIAGNOSIS — Z7951 Long term (current) use of inhaled steroids: Secondary | ICD-10-CM | POA: Insufficient documentation

## 2014-04-25 DIAGNOSIS — G8929 Other chronic pain: Secondary | ICD-10-CM | POA: Insufficient documentation

## 2014-04-25 DIAGNOSIS — I4891 Unspecified atrial fibrillation: Secondary | ICD-10-CM | POA: Insufficient documentation

## 2014-04-25 DIAGNOSIS — I252 Old myocardial infarction: Secondary | ICD-10-CM | POA: Insufficient documentation

## 2014-04-25 DIAGNOSIS — M109 Gout, unspecified: Secondary | ICD-10-CM | POA: Insufficient documentation

## 2014-04-25 DIAGNOSIS — Z8719 Personal history of other diseases of the digestive system: Secondary | ICD-10-CM | POA: Insufficient documentation

## 2014-04-25 DIAGNOSIS — Z8669 Personal history of other diseases of the nervous system and sense organs: Secondary | ICD-10-CM | POA: Insufficient documentation

## 2014-04-25 DIAGNOSIS — S3992XA Unspecified injury of lower back, initial encounter: Secondary | ICD-10-CM | POA: Insufficient documentation

## 2014-04-25 DIAGNOSIS — Z792 Long term (current) use of antibiotics: Secondary | ICD-10-CM | POA: Diagnosis not present

## 2014-04-25 DIAGNOSIS — Y9289 Other specified places as the place of occurrence of the external cause: Secondary | ICD-10-CM | POA: Diagnosis not present

## 2014-04-25 DIAGNOSIS — Z7901 Long term (current) use of anticoagulants: Secondary | ICD-10-CM | POA: Diagnosis not present

## 2014-04-25 MED ORDER — LIDOCAINE 5 % EX PTCH: 1.0000 | MEDICATED_PATCH | CUTANEOUS | Status: DC

## 2014-04-25 MED ORDER — OXYCODONE-ACETAMINOPHEN 5-325 MG PO TABS: 1.0000 | ORAL_TABLET | ORAL | Status: DC | PRN

## 2014-04-25 NOTE — ED Provider Notes (Signed)
CSN: [id number]     Arrival date & time 04/25/14  1503 History  This chart was scribed for non-physician practitioner, Margarita Mail, PA-C,working with Charlesetta Shanks, MD, by Marlowe Kays, ED Scribe. This patient was seen in room WTR7/WTR7 and the patient's care was started at 4:32 PM.  Chief Complaint  Patient presents with  . Shoulder Pain  . Back Pain   Patient is a 59 y.o. male presenting with shoulder pain and back pain. The history is provided by the patient and medical records. No language interpreter was used.  Shoulder Pain Associated symptoms: back pain   Associated symptoms: no fever   Back Pain Associated symptoms: no fever and no numbness     HPI Comments:  Daniel Whitney is a 59 y.o. obese male on dialysis who presents to the Emergency Department complaining of severe right shoulder pain that began approximately two days ago. Pt states he fell on the shoulder about two weeks ago and stumbled, catching himself with the right arm. He reports inability to move the right arm without helping with the left arm. He has been taking Tylenol for the pain with no significant relief of the pain. Denies modifying factors. Denies numbness, tingling, warmth or redness of the shoulder, fever, chills, nausea or vomiting. He is dialyzed Monday, Wednesday and Friday and denies missing any sessions. He states he is to see his orthopedist next week. PMHx of glaucoma, HTN, HLD, atrial fibrillation, CHF, DM, GERD, CVA and chronic low back pain.   Past Medical History  Diagnosis Date  . Glaucoma   . Hypertension   . Hyperlipidemia   . Atrial fibrillation 12/30/2012  . CHF (congestive heart failure) 12/30/2012  . Diabetic retinopathy 12/30/2012  . Morbid obesity 12/30/2012  . Pulmonary hypertension 12/30/2012  . Myocardial infarction     "I've had ~ 3; last one was in ~ 10/2013" (01/30/2014)  . Diabetes mellitus with neuropathy   . OSA (obstructive sleep apnea) 12/30/2012    "lost weight; no  longer needed CPAP; retested said I needed it; didn't followup cause I was feeling fine" (01/30/2014)  . GERD (gastroesophageal reflux disease)   . Stroke ~ 2005; ~ 2005    "they were mild; I didn't even notice I'd had them"; denies residual on 01/30/2014  . History of gout     "right big toe"  . Arthritis     "all over"  . Chronic lower back pain   . ESRD (end stage renal disease) on dialysis started in 2013    "MWF; [facility]; [address]" (01/30/2014)  . Refusal of blood transfusions as patient is Jehovah's Witness    Past Surgical History  Procedure Laterality Date  . Total hip arthroplasty Right 1980  . Av fistula placement Left ~ 10/2013    "forearm"  . Closed reduction hip dislocation Right 1970's   Family History  Problem Relation Age of Onset  . Arthritis Other   . Heart disease Other   . Hyperlipidemia Other   . Stroke Other   . Hypertension Other   . Kidney disease Other   . Diabetes Other    History  Substance Use Topics  . Smoking status: Never Smoker   . Smokeless tobacco: Never Used  . Alcohol Use: 1.5 - 2.0 oz/week    3-4 Standard drinks or equivalent per week     Comment: 01/30/2014 "none in the last 3 weeks; primarily drink w/my friends on the weekends"    Review of Systems  Constitutional: Negative  for fever and chills.  Gastrointestinal: Negative for nausea and vomiting.  Musculoskeletal: Positive for back pain and arthralgias.  Skin: Negative for color change and wound.  Neurological: Negative for numbness.    Allergies  Review of patient's allergies indicates no known allergies.  Home Medications   Prior to Admission medications   Medication Sig Start Date End Date Taking? Authorizing Provider  albuterol (PROVENTIL HFA;VENTOLIN HFA) 108 (90 BASE) MCG/ACT inhaler Inhale 2 puffs into the lungs every 6 (six) hours as needed for wheezing or shortness of breath. 01/31/14   Shanker Kristeen Mans, MD  allopurinol (ZYLOPRIM) 100 MG tablet Take 1  tablet (100 mg total) by mouth 2 (two) times daily. 11/05/13   Lyndal Pulley, DO  amiodarone (PACERONE) 200 MG tablet Take 1 tablet (200 mg total) by mouth daily. 11/05/13   Lyndal Pulley, DO  amitriptyline (ELAVIL) 50 MG tablet Take 1 tablet (50 mg total) by mouth at bedtime. 11/11/13   Biagio Borg, MD  Ascorbic Acid (VITAMIN C) 1000 MG tablet Take 1,000 mg by mouth daily.    Historical Provider, MD  b complex-vitamin c-folic acid (NEPHRO-VITE) 0.8 MG TABS tablet Take 1 tablet by mouth daily.    Historical Provider, MD  carvedilol (COREG) 25 MG tablet Take 25 mg by mouth 2 (two) times daily with a meal.    Historical Provider, MD  clindamycin (CLEOCIN T) 1 % lotion Apply topically 2 (two) times daily. 09/04/13   Biagio Borg, MD  dextromethorphan-guaiFENesin [facility] DM) 30-600 MG per 12 hr tablet Take 1 tablet by mouth 2 (two) times daily. 01/31/14   Shanker Kristeen Mans, MD  Eflornithine HCl (VANIQA) 13.9 % cream Apply 1 application topically 2 (two) times daily with a meal.     Historical Provider, MD  fluticasone (FLONASE) 50 MCG/ACT nasal spray Place 2 sprays into both nostrils daily. 01/31/14   Shanker Kristeen Mans, MD  levofloxacin (LEVAQUIN) 250 MG tablet Take 1 tablet (250 mg total) by mouth every other day. 02/01/14   Shanker Kristeen Mans, MD  lidocaine (LIDODERM) 5 % Place 1 patch onto the skin daily. Remove & Discard patch within 12 hours or as directed by MD 04/25/14   Margarita Mail, PA-C  loratadine (CLARITIN) 10 MG tablet Take 1 tablet (10 mg total) by mouth daily. 01/31/14   Shanker Kristeen Mans, MD  Multiple Vitamin (MULTIVITAMIN WITH MINERALS) TABS tablet Take 1 tablet by mouth daily.    Historical Provider, MD  neomycin-polymyxin-hydrocortisone (CORTISPORIN) otic solution Place 4 drops into the right ear 4 (four) times daily. 10/27/13   Biagio Borg, MD  ondansetron (ZOFRAN) 4 MG tablet Take 1 tablet (4 mg total) by mouth every 6 (six) hours. 07/28/13   Cleatrice Burke, PA-C  OVER THE COUNTER  MEDICATION 2 (two) times daily. Benzaderm Wash    Historical Provider, MD  OVER THE COUNTER MEDICATION 1 application 2 (two) times daily. Melaquin Complex    Historical Provider, MD  oxyCODONE-acetaminophen (PERCOCET) 5-325 MG per tablet Take 1-2 tablets by mouth every 4 (four) hours as needed. 04/27/14   Lyndal Pulley, DO  predniSONE (DELTASONE) 10 MG tablet Take 4 tablets (40 mg) daily for 2 days, then, Take 3 tablets (30 mg) daily for 2 days, then, Take 2 tablets (20 mg) daily for 2 days, then, Take 1 tablets (10 mg) daily for 1 days, then stop 01/31/14   Jonetta Osgood, MD  PRESCRIPTION MEDICATION every Monday, Wednesday, and Friday. Dialysis  Historical Provider, MD  sevelamer carbonate (RENVELA) 800 MG tablet Take 1,600 mg by mouth 3 (three) times daily with meals.     Historical Provider, MD  simvastatin (ZOCOR) 20 MG tablet Take 1 tablet (20 mg total) by mouth daily. Patient taking differently: Take 20 mg by mouth every evening.  11/05/13   Lyndal Pulley, DO  sitaGLIPtin (JANUVIA) 25 MG tablet Take 1 tablet (25 mg total) by mouth daily. 11/05/13   Lyndal Pulley, DO  sodium chloride (OCEAN) 0.65 % SOLN nasal spray Place 1 spray into both nostrils as needed for congestion. 01/31/14   Shanker Kristeen Mans, MD  tadalafil (CIALIS) 20 MG tablet Take 1 tablet (20 mg total) by mouth daily as needed for erectile dysfunction. 10/27/13   Biagio Borg, MD  valsartan (DIOVAN) 40 MG tablet Take 40 mg by mouth 3 (three) times a week. Takes on Sunday, Tuesday, and Saturday    Historical Provider, MD  warfarin (COUMADIN) 3 MG tablet Take 1-1.5 tablets (3-4.5 mg total) by mouth See admin instructions. Please hold Coumadin 01/31/14-take 0.5 tablet on 02/01/14-and then resume usual dosing of on Thursday 1.5 tablet and on the remaining days you take 1 tabletTake as directed by anticoagulation clinic. Please make sure you check your INR in the next 2 days 01/31/14   Jonetta Osgood, MD   BP 139/93 mmHg   Pulse 89  Temp(Src) 98.5 F (36.9 C) (Oral)  Resp 16  SpO2 95% Physical Exam  Constitutional: He is oriented to person, place, and time. He appears well-developed and well-nourished.  HENT:  Head: Normocephalic and atraumatic.  Eyes: EOM are normal.  Neck: Normal range of motion.  Cardiovascular: Normal rate.   Pulmonary/Chest: Effort normal.  Musculoskeletal: Normal range of motion. He exhibits tenderness.  Unable to move with active ROM. Pain with passive ROM. Exquisitely tenderness to palpation. Unable to perform strength test due to pain. Pt uses left hand to move right arm. No signs of infection. Positive effusion.  Neurological: He is alert and oriented to person, place, and time.  NVI  Skin: Skin is warm and dry.  Psychiatric: He has a normal mood and affect. His behavior is normal.  Nursing note and vitals reviewed.   ED Course  Procedures (including critical care time)    COORDINATION OF CARE: 4:39 PM- Will prescribe pain medication and advised pt to keep orthopedist appt. Pt verbalizes understanding and agrees to plan.  Medications - No data to display  Labs Review Labs Reviewed - No data to display  Imaging Review No results found.   EKG Interpretation None      MDM   Final diagnoses:  Right shoulder pain    Patient with apparent Shoulder injury. Seems consistent with a rotator cuff tear. Will refer to orthopedics. UTD on Dilaysis. No other complaints.d/c with Norco, renally dosed.  I personally performed the services described in this documentation, which was scribed in my presence. The recorded information has been reviewed and is accurate.    Margarita Mail, PA-C 04/27/14 [place], MD 05/01/14 [phone]

## 2014-04-25 NOTE — ED Notes (Signed)
Pt complains of right shoulder pain for the past 2 days. Pt states he fell on his shoulder 2 weeks ago, then fell on it again 2 days ago. Pt states he has taken tylenol for pain, which has not helped. Pt states his lower back has also hurt since yesterday.

## 2014-04-25 NOTE — Discharge Instructions (Signed)
Cryotherapy °Cryotherapy means treatment with cold. Ice or gel packs can be used to reduce both pain and swelling. Ice is the most helpful within the first 24 to 48 hours after an injury or flare-up from overusing a muscle or joint. Sprains, strains, spasms, burning pain, shooting pain, and aches can all be eased with ice. Ice can also be used when recovering from surgery. Ice is effective, has very few side effects, and is safe for most people to use. °PRECAUTIONS  °Ice is not a safe treatment option for people with: °· Raynaud phenomenon. This is a condition affecting small blood vessels in the extremities. Exposure to cold may cause your problems to return. °· Cold hypersensitivity. There are many forms of cold hypersensitivity, including: °¨ Cold urticaria. Red, itchy hives appear on the skin when the tissues begin to warm after being iced. °¨ Cold erythema. This is a red, itchy rash caused by exposure to cold. °¨ Cold hemoglobinuria. Red blood cells break down when the tissues begin to warm after being iced. The hemoglobin that carry oxygen are passed into the urine because they cannot combine with blood proteins fast enough. °· Numbness or altered sensitivity in the area being iced. °If you have any of the following conditions, do not use ice until you have discussed cryotherapy with your caregiver: °· Heart conditions, such as arrhythmia, angina, or chronic heart disease. °· High blood pressure. °· Healing wounds or open skin in the area being iced. °· Current infections. °· Rheumatoid arthritis. °· Poor circulation. °· Diabetes. °Ice slows the blood flow in the region it is applied. This is beneficial when trying to stop inflamed tissues from spreading irritating chemicals to surrounding tissues. However, if you expose your skin to cold temperatures for too long or without the proper protection, you can damage your skin or nerves. Watch for signs of skin damage due to cold. °HOME CARE INSTRUCTIONS °Follow  these tips to use ice and cold packs safely. °· Place a dry or damp towel between the ice and skin. A damp towel will cool the skin more quickly, so you may need to shorten the time that the ice is used. °· For a more rapid response, add gentle compression to the ice. °· Ice for no more than 10 to 20 minutes at a time. The bonier the area you are icing, the less time it will take to get the benefits of ice. °· Check your skin after 5 minutes to make sure there are no signs of a poor response to cold or skin damage. °· Rest 20 minutes or more between uses. °· Once your skin is numb, you can end your treatment. You can test numbness by very lightly touching your skin. The touch should be so light that you do not see the skin dimple from the pressure of your fingertip. When using ice, most people will feel these normal sensations in this order: cold, burning, aching, and numbness. °· Do not use ice on someone who cannot communicate their responses to pain, such as small children or people with dementia. °HOW TO MAKE AN ICE PACK °Ice packs are the most common way to use ice therapy. Other methods include ice massage, ice baths, and cryosprays. Muscle creams that cause a cold, tingly feeling do not offer the same benefits that ice offers and should not be used as a substitute unless recommended by your caregiver. °To make an ice pack, do one of the following: °· Place crushed ice or a   bag of frozen vegetables in a sealable plastic bag. Squeeze out the excess air. Place this bag inside another plastic bag. Slide the bag into a pillowcase or place a damp towel between your skin and the bag.  Mix 3 parts water with 1 part rubbing alcohol. Freeze the mixture in a sealable plastic bag. When you remove the mixture from the freezer, it will be slushy. Squeeze out the excess air. Place this bag inside another plastic bag. Slide the bag into a pillowcase or place a damp towel between your skin and the bag. SEEK MEDICAL CARE  IF:  You develop white spots on your skin. This may give the skin a blotchy (mottled) appearance.  Your skin turns blue or pale.  Your skin becomes waxy or hard.  Your swelling gets worse. MAKE SURE YOU:   Understand these instructions.  Will watch your condition.  Will get help right away if you are not doing well or get worse. Document Released: 09/25/2010 Document Revised: 06/15/2013 Document Reviewed: 09/25/2010 [facility] Patient Information 2015 [place], [place]. This information is not intended to replace advice given to you by your health care provider. Make sure you discuss any questions you have with your health care provider.  Shoulder Pain The shoulder is the joint that connects your arms to your body. The bones that form the shoulder joint include the upper arm bone (humerus), the shoulder blade (scapula), and the collarbone (clavicle). The top of the humerus is shaped like a ball and fits into a rather flat socket on the scapula (glenoid cavity). A combination of muscles and strong, fibrous tissues that connect muscles to bones (tendons) support your shoulder joint and hold the ball in the socket. Small, fluid-filled sacs (bursae) are located in different areas of the joint. They act as cushions between the bones and the overlying soft tissues and help reduce friction between the gliding tendons and the bone as you move your arm. Your shoulder joint allows a wide range of motion in your arm. This range of motion allows you to do things like scratch your back or throw a ball. However, this range of motion also makes your shoulder more prone to pain from overuse and injury. Causes of shoulder pain can originate from both injury and overuse and usually can be grouped in the following four categories:  Redness, swelling, and pain (inflammation) of the tendon (tendinitis) or the bursae (bursitis).  Instability, such as a dislocation of the joint.  Inflammation of the joint  (arthritis).  Broken bone (fracture). HOME CARE INSTRUCTIONS   Apply ice to the sore area.  Put ice in a plastic bag.  Place a towel between your skin and the bag.  Leave the ice on for 15-20 minutes, 3-4 times per day for the first 2 days, or as directed by your health care provider.  Stop using cold packs if they do not help with the pain.  If you have a shoulder sling or immobilizer, wear it as long as your caregiver instructs. Only remove it to shower or bathe. Move your arm as little as possible, but keep your hand moving to prevent swelling.  Squeeze a soft ball or foam pad as much as possible to help prevent swelling.  Only take over-the-counter or prescription medicines for pain, discomfort, or fever as directed by your caregiver. SEEK MEDICAL CARE IF:   Your shoulder pain increases, or new pain develops in your arm, hand, or fingers.  Your hand or fingers become cold and  numb.  Your pain is not relieved with medicines. SEEK IMMEDIATE MEDICAL CARE IF:   Your arm, hand, or fingers are numb or tingling.  Your arm, hand, or fingers are significantly swollen or turn white or blue. MAKE SURE YOU:   Understand these instructions.  Will watch your condition.  Will get help right away if you are not doing well or get worse. Document Released: 11/08/2004 Document Revised: 06/15/2013 Document Reviewed: 01/13/2011 [facility] Patient Information 2015 [place], [place]. This information is not intended to replace advice given to you by your health care provider. Make sure you discuss any questions you have with your health care provider.  Shoulder Range of Motion Exercises The shoulder is the most flexible joint in the human body. Because of this it is also the most unstable joint in the body. All ages can develop shoulder problems. Early treatment of problems is necessary for a good outcome. People react to shoulder pain by decreasing the movement of the joint. After a brief period  of time, the shoulder can become "frozen". This is an almost complete loss of the ability to move the damaged shoulder. Following injuries your caregivers can give you instructions on exercises to keep your range of motion (ability to move your shoulder freely), or regain it if it has been lost.  [place]: Codman's Exercise or Pendulum Exercise  This exercise may be performed in a prone (face-down) lying position or standing while leaning on a chair with the opposite arm. Its purpose is to relax the muscles in your shoulder and slowly but surely increase the range of motion and to relieve pain.  Lie on your stomach close to the side edge of the bed. Let your weak arm hang over the edge of the bed. Relax your shoulder, arm and hand. Let your shoulder blade relax and drop down.  Slowly and gently swing your arm forward and back. Do not use your neck muscles; relax them. It might be easier to have someone else gently start swinging your arm.  As pain decreases, increase your swing. To start, arm swing should begin at 15 degree angles. In time and as pain lessens, move to 30-45 degree angles. Start with swinging for about 15 seconds, and work towards swinging for 3 to 5 minutes.  This exercise may also be performed in a standing/bent over position.  Stand and hold onto a sturdy chair with your good arm. Bend forward at the waist and bend your knees slightly to help protect your back. Relax your weak arm, let it hang limp. Relax your shoulder blade and let it drop.  Keep your shoulder relaxed and use body motion to swing your arm in small circles.  Stand up tall and relax.  Repeat motion and change direction of circles.  Start with swinging for about 30 seconds, and work towards swinging for 3 to 5 minutes. STRETCHING EXERCISES:  Lift your arm out in front of you with the elbow bent at 90 degrees. Using your other arm gently pull the elbow  forward and across your body.  Bend one arm behind you with the palm facing outward. Using the other arm, hold a towel or rope and reach this arm up above your head, then bend it at the elbow to move your wrist to behind your neck. Grab the free end of the towel with the hand behind your back. Gently pull the towel up with the hand behind your neck, gradually  increasing the pull on the hand behind the small of your back. Then, gradually pull down with the hand behind the small of your back. This will pull the hand and arm behind your neck further. Both shoulders will have an increased range of motion with repetition of this exercise. STRENGTHENING EXERCISES:  Standing with your arm at your side and straight out from your shoulder with the elbow bent at 90 degrees, hold onto a small weight and slowly raise your hand so it points straight up in the air. Repeat this five times to begin with, and gradually increase to ten times. Do this four times per day. As you grow stronger you can gradually increase the weight.  Repeat the above exercise, only this time using an elastic band. Start with your hand up in the air and pull down until your hand is by your side. As you grow stronger, gradually increase the amount you pull by increasing the number or size of the elastic bands. Use the same amount of repetitions.  Standing with your hand at your side and holding onto a weight, gradually lift the hand in front of you until it is over your head. Do the same also with the hand remaining at your side and lift the hand away from your body until it is again over your head. Repeat this five times to begin with, and gradually increase to ten times. Do this four times per day. As you grow stronger you can gradually increase the weight. Document Released: 10/28/2002 Document Revised: 02/03/2013 Document Reviewed: 01/29/2005 [facility] Patient Information 2015 [place], [place]. This information is not intended to replace  advice given to you by your health care provider. Make sure you discuss any questions you have with your health care provider.

## 2014-04-26 ENCOUNTER — Telehealth: Payer: Self-pay | Admitting: Internal Medicine

## 2014-04-26 NOTE — Telephone Encounter (Signed)
Pt want to speak the assistant concern about his finger is getting cold. Pt went to the ER yesterday and they gave him percocet which is not working. Please help, pt does not know what to do at this point.

## 2014-04-26 NOTE — Telephone Encounter (Signed)
Discussed with pt. He states that he is in too much pain to wait until Thursday. appt scheduled for 3.15.16 @1145 

## 2014-04-26 NOTE — Telephone Encounter (Signed)
Ask if discoloration Try gloves for now See if any drug changes and if so would stop.  See me Thursday if not better

## 2014-04-27 ENCOUNTER — Other Ambulatory Visit (INDEPENDENT_AMBULATORY_CARE_PROVIDER_SITE_OTHER): Payer: Medicare Other

## 2014-04-27 ENCOUNTER — Ambulatory Visit (INDEPENDENT_AMBULATORY_CARE_PROVIDER_SITE_OTHER): Payer: Medicare Other | Admitting: Family Medicine

## 2014-04-27 ENCOUNTER — Encounter: Payer: Self-pay | Admitting: Family Medicine

## 2014-04-27 VITALS — BP 140/72 | HR 94

## 2014-04-27 DIAGNOSIS — I639 Cerebral infarction, unspecified: Secondary | ICD-10-CM | POA: Diagnosis not present

## 2014-04-27 DIAGNOSIS — M25511 Pain in right shoulder: Secondary | ICD-10-CM | POA: Diagnosis not present

## 2014-04-27 DIAGNOSIS — M75101 Unspecified rotator cuff tear or rupture of right shoulder, not specified as traumatic: Secondary | ICD-10-CM

## 2014-04-27 MED ORDER — OXYCODONE-ACETAMINOPHEN 5-325 MG PO TABS: 1.0000 | ORAL_TABLET | ORAL | Status: DC | PRN

## 2014-04-27 NOTE — Assessment & Plan Note (Signed)
Does have what appears to be a rotator cuff tear. We discussed icing regimen, home exercises, and patient was given Percocet. I do feel that an MRI is needed some patient has all options. With this potentially being a traumatic tear I do think surgical intervention could be beneficial especially with this being his dominant arm. We will make sure that there chronic changes it not significantly severe. Patient will come back 1-2 days after the MRI and we'll discuss in further detail. Patient given topical anti-inflammatories to try. Will call if any worsening symptoms occur.  Spent  25 minutes with patient face-to-face and had greater than 50% of counseling including as described above in assessment and plan.

## 2014-04-27 NOTE — Progress Notes (Signed)
Pre visit review using our clinic review tool, if applicable. No additional management support is needed unless otherwise documented below in the visit note. 

## 2014-04-27 NOTE — Progress Notes (Signed)
[name] [place] [place], [place] [address] Phone: [phone] Subjective:     CC:  Right shoulder pain  [id number] Daniel Whitney is a 59 y.o. male coming in with complaint of  Right shoulder pain. She was seen by me one week ago. Patient at that time did have some mild weakness of the rotator cuff. Patient did fall on the 11th March. When patient severe pain in the arm immediately.  patient went into the emergency department had x-rays that did not show any significant bony abnormality but did have a small joint effusion. 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 States that the pain is severe and is no longer able to lift his arm. Patient denies any numbness  That is constant. Patient states that it does seem to be intermittent in his fingers. Worse if he lays his arm in a specific position and he has been doing this because he has been in the shoulder sling.    Past medical history, social, surgical and family history all reviewed in electronic medical record.   Review of Systems: No headache, visual changes, nausea, vomiting, diarrhea, constipation, dizziness, abdominal pain, skin rash, fevers, chills, night sweats, weight loss, swollen lymph nodes, body aches, joint swelling, muscle aches, chest pain, shortness of breath, mood changes.   Objective Blood pressure 140/72, pulse 94, SpO2 93 %.  General: No apparent distress alert and oriented x3 mood and affect normal, dressed appropriately.  HEENT: Pupils equal, extraocular movements intact  Respiratory: Patient's speak in full sentences and does not appear short of breath  Cardiovascular: No lower extremity edema, non tender, no erythema  Skin: Warm dry intact with no signs of infection or rash on extremities or on axial skeleton.  Abdomen: Soft nontender  Neuro: Cranial nerves II through XII are intact, neurovascularly intact in all extremities with 2+ DTRs and 2+ pulses.    Lymph: No lymphadenopathy of posterior or anterior cervical chain or axillae bilaterally.  Gait normal with good balance and coordination.  MSK:  Non tender with full range of motion and good stability and symmetric strength and tone of elbows, wrist, hip, knee and ankles bilaterally.    Shoulder:Right  Inspection reveals no abnormalities, atrophy or asymmetry. Palpation is normal with no tenderness over AC joint or bicipital groove. ROM is full in all planes. Rotator cuff strength 2 out of 5 compared to 5 out of 5 on the contralateral side signs of impingement with negative Neer and Hawkin's tests, empty can sign. Speeds and Yergason's tests normal. No labral pathology noted with negative Obrien's, negative clunk and good stability. Normal scapular function observed. No painful arc and no drop arm sign. No apprehension sign   MSK US performed of: right shoulder This study was ordered, performed, and interpreted by Charlann Boxer D.O.  Shoulder:   Supraspinatus:  Patient does have what appears to be a full-thickness tear. Mild to moderate osteophytic changes. Patient's full-thickness tear approximately 1.19 cm of retraction. Subscapularis:  Tear noted with retraction. Teres Minor:  Appears normal on long and transverse views. AC joint:  Moderate arthritis Glenohumeral Joint:  Moderate arthritis Glenoid Labrum:  Degenerative changes Biceps Tendon:  Appears normal on long and transverse views, no fraying of tendon, tendon located in intertubercular groove, no subluxation with shoulder internal or external rotation. No increased power doppler signal.  Impression: Rotator cuff tear     Impression and Recommendations:     This case required medical decision  making of moderate complexity.

## 2014-04-27 NOTE — Patient Instructions (Signed)
Good to see you Heat 20 minutes, ice 20 minutes and off for 20 minutes.  Repeat as much as you want.  We will get MRI and see how bad it is Try the pennsaid twice daily Come see me 1-2 days after MRi and we will discuss.

## 2014-04-28 DIAGNOSIS — D509 Iron deficiency anemia, unspecified: Secondary | ICD-10-CM | POA: Diagnosis not present

## 2014-04-28 DIAGNOSIS — D631 Anemia in chronic kidney disease: Secondary | ICD-10-CM | POA: Diagnosis not present

## 2014-04-28 DIAGNOSIS — N186 End stage renal disease: Secondary | ICD-10-CM | POA: Diagnosis not present

## 2014-04-28 DIAGNOSIS — N2581 Secondary hyperparathyroidism of renal origin: Secondary | ICD-10-CM | POA: Diagnosis not present

## 2014-04-29 DIAGNOSIS — N186 End stage renal disease: Secondary | ICD-10-CM | POA: Diagnosis not present

## 2014-04-29 DIAGNOSIS — E8779 Other fluid overload: Secondary | ICD-10-CM | POA: Diagnosis not present

## 2014-04-30 DIAGNOSIS — D631 Anemia in chronic kidney disease: Secondary | ICD-10-CM | POA: Diagnosis not present

## 2014-04-30 DIAGNOSIS — D509 Iron deficiency anemia, unspecified: Secondary | ICD-10-CM | POA: Diagnosis not present

## 2014-04-30 DIAGNOSIS — N186 End stage renal disease: Secondary | ICD-10-CM | POA: Diagnosis not present

## 2014-04-30 DIAGNOSIS — N2581 Secondary hyperparathyroidism of renal origin: Secondary | ICD-10-CM | POA: Diagnosis not present

## 2014-05-03 DIAGNOSIS — N186 End stage renal disease: Secondary | ICD-10-CM | POA: Diagnosis not present

## 2014-05-03 DIAGNOSIS — D631 Anemia in chronic kidney disease: Secondary | ICD-10-CM | POA: Diagnosis not present

## 2014-05-03 DIAGNOSIS — N2581 Secondary hyperparathyroidism of renal origin: Secondary | ICD-10-CM | POA: Diagnosis not present

## 2014-05-03 DIAGNOSIS — D509 Iron deficiency anemia, unspecified: Secondary | ICD-10-CM | POA: Diagnosis not present

## 2014-05-04 ENCOUNTER — Ambulatory Visit: Payer: PRIVATE HEALTH INSURANCE | Admitting: Family Medicine

## 2014-05-05 DIAGNOSIS — N2581 Secondary hyperparathyroidism of renal origin: Secondary | ICD-10-CM | POA: Diagnosis not present

## 2014-05-05 DIAGNOSIS — D509 Iron deficiency anemia, unspecified: Secondary | ICD-10-CM | POA: Diagnosis not present

## 2014-05-05 DIAGNOSIS — N186 End stage renal disease: Secondary | ICD-10-CM | POA: Diagnosis not present

## 2014-05-05 DIAGNOSIS — D631 Anemia in chronic kidney disease: Secondary | ICD-10-CM | POA: Diagnosis not present

## 2014-05-05 DIAGNOSIS — I4891 Unspecified atrial fibrillation: Secondary | ICD-10-CM | POA: Diagnosis not present

## 2014-05-05 LAB — PROTIME-INR: INR: 3.3 — AB (ref <=1.1)

## 2014-05-07 DIAGNOSIS — D631 Anemia in chronic kidney disease: Secondary | ICD-10-CM | POA: Diagnosis not present

## 2014-05-07 DIAGNOSIS — N2581 Secondary hyperparathyroidism of renal origin: Secondary | ICD-10-CM | POA: Diagnosis not present

## 2014-05-07 DIAGNOSIS — N186 End stage renal disease: Secondary | ICD-10-CM | POA: Diagnosis not present

## 2014-05-07 DIAGNOSIS — D509 Iron deficiency anemia, unspecified: Secondary | ICD-10-CM | POA: Diagnosis not present

## 2014-05-08 ENCOUNTER — Inpatient Hospital Stay: Admission: RE | Admit: 2014-05-08 | Payer: Medicare Other | Source: Ambulatory Visit

## 2014-05-10 DIAGNOSIS — N186 End stage renal disease: Secondary | ICD-10-CM | POA: Diagnosis not present

## 2014-05-10 DIAGNOSIS — D509 Iron deficiency anemia, unspecified: Secondary | ICD-10-CM | POA: Diagnosis not present

## 2014-05-10 DIAGNOSIS — N2581 Secondary hyperparathyroidism of renal origin: Secondary | ICD-10-CM | POA: Diagnosis not present

## 2014-05-10 DIAGNOSIS — D631 Anemia in chronic kidney disease: Secondary | ICD-10-CM | POA: Diagnosis not present

## 2014-05-11 ENCOUNTER — Ambulatory Visit (INDEPENDENT_AMBULATORY_CARE_PROVIDER_SITE_OTHER): Payer: Medicare Other | Admitting: General Practice

## 2014-05-11 ENCOUNTER — Other Ambulatory Visit: Payer: Self-pay | Admitting: Family Medicine

## 2014-05-11 DIAGNOSIS — I639 Cerebral infarction, unspecified: Secondary | ICD-10-CM

## 2014-05-11 DIAGNOSIS — I270 Primary pulmonary hypertension: Secondary | ICD-10-CM

## 2014-05-11 DIAGNOSIS — I272 Pulmonary hypertension, unspecified: Secondary | ICD-10-CM

## 2014-05-11 DIAGNOSIS — Z5181 Encounter for therapeutic drug level monitoring: Secondary | ICD-10-CM

## 2014-05-11 NOTE — Telephone Encounter (Signed)
Refill done.  

## 2014-05-11 NOTE — Progress Notes (Signed)
Agree with plan 

## 2014-05-11 NOTE — Progress Notes (Signed)
Pre visit review using our clinic review tool, if applicable. No additional management support is needed unless otherwise documented below in the visit note. 

## 2014-05-12 DIAGNOSIS — D631 Anemia in chronic kidney disease: Secondary | ICD-10-CM | POA: Diagnosis not present

## 2014-05-12 DIAGNOSIS — N2581 Secondary hyperparathyroidism of renal origin: Secondary | ICD-10-CM | POA: Diagnosis not present

## 2014-05-12 DIAGNOSIS — D509 Iron deficiency anemia, unspecified: Secondary | ICD-10-CM | POA: Diagnosis not present

## 2014-05-12 DIAGNOSIS — N186 End stage renal disease: Secondary | ICD-10-CM | POA: Diagnosis not present

## 2014-05-13 DIAGNOSIS — Z992 Dependence on renal dialysis: Secondary | ICD-10-CM | POA: Diagnosis not present

## 2014-05-13 DIAGNOSIS — N186 End stage renal disease: Secondary | ICD-10-CM | POA: Diagnosis not present

## 2014-05-14 DIAGNOSIS — D509 Iron deficiency anemia, unspecified: Secondary | ICD-10-CM | POA: Diagnosis not present

## 2014-05-14 DIAGNOSIS — N186 End stage renal disease: Secondary | ICD-10-CM | POA: Diagnosis not present

## 2014-05-17 DIAGNOSIS — D631 Anemia in chronic kidney disease: Secondary | ICD-10-CM | POA: Diagnosis not present

## 2014-05-17 DIAGNOSIS — N186 End stage renal disease: Secondary | ICD-10-CM | POA: Diagnosis not present

## 2014-05-17 DIAGNOSIS — D509 Iron deficiency anemia, unspecified: Secondary | ICD-10-CM | POA: Diagnosis not present

## 2014-05-18 ENCOUNTER — Telehealth: Payer: Self-pay | Admitting: *Deleted

## 2014-05-18 ENCOUNTER — Ambulatory Visit: Payer: Medicare Other | Admitting: Emergency Medicine

## 2014-05-18 NOTE — Telephone Encounter (Signed)
[facility] Night - Client [place] Patient Name: Daniel Whitney [facility] Gender: Male DOB: [birth year] Age: 59 Y 62 M 29 D Return Phone Number: [phone] (Primary) Address: [address] City/State/Zip: [place] [address] Client [facility] - Client Client Site [facility] - Night Contact Type Call Caller Name anon Caller Phone Number na Relationship To Patient Self Is this call to report lab results? No Call Type General Information Initial Comment Caller states needs to cancel an appointment. April 4 Will reschedule when he gets back in town General Information Type Appointment Nurse Assessment Guidelines Guideline Title Affirmed Question Affirmed Notes Nurse Date/Time (Eastern Time) Disp. Time Eilene Ghazi Time) Disposition Final User 05/14/2014 5:37:28 PM General Information Provided Yes Rayder, Bettey Mare After Care Instructions Given Call Event Type User Date / Time Description

## 2014-05-19 DIAGNOSIS — N186 End stage renal disease: Secondary | ICD-10-CM | POA: Diagnosis not present

## 2014-05-19 DIAGNOSIS — D631 Anemia in chronic kidney disease: Secondary | ICD-10-CM | POA: Diagnosis not present

## 2014-05-19 DIAGNOSIS — D509 Iron deficiency anemia, unspecified: Secondary | ICD-10-CM | POA: Diagnosis not present

## 2014-05-21 DIAGNOSIS — D631 Anemia in chronic kidney disease: Secondary | ICD-10-CM | POA: Diagnosis not present

## 2014-05-21 DIAGNOSIS — N186 End stage renal disease: Secondary | ICD-10-CM | POA: Diagnosis not present

## 2014-05-21 DIAGNOSIS — D509 Iron deficiency anemia, unspecified: Secondary | ICD-10-CM | POA: Diagnosis not present

## 2014-05-24 DIAGNOSIS — N186 End stage renal disease: Secondary | ICD-10-CM | POA: Diagnosis not present

## 2014-05-24 DIAGNOSIS — D631 Anemia in chronic kidney disease: Secondary | ICD-10-CM | POA: Diagnosis not present

## 2014-05-24 DIAGNOSIS — D509 Iron deficiency anemia, unspecified: Secondary | ICD-10-CM | POA: Diagnosis not present

## 2014-05-26 DIAGNOSIS — D631 Anemia in chronic kidney disease: Secondary | ICD-10-CM | POA: Diagnosis not present

## 2014-05-26 DIAGNOSIS — D509 Iron deficiency anemia, unspecified: Secondary | ICD-10-CM | POA: Diagnosis not present

## 2014-05-26 DIAGNOSIS — N186 End stage renal disease: Secondary | ICD-10-CM | POA: Diagnosis not present

## 2014-05-28 DIAGNOSIS — N186 End stage renal disease: Secondary | ICD-10-CM | POA: Diagnosis not present

## 2014-05-28 DIAGNOSIS — D631 Anemia in chronic kidney disease: Secondary | ICD-10-CM | POA: Diagnosis not present

## 2014-05-28 DIAGNOSIS — D509 Iron deficiency anemia, unspecified: Secondary | ICD-10-CM | POA: Diagnosis not present

## 2014-05-31 DIAGNOSIS — D631 Anemia in chronic kidney disease: Secondary | ICD-10-CM | POA: Diagnosis not present

## 2014-05-31 DIAGNOSIS — D509 Iron deficiency anemia, unspecified: Secondary | ICD-10-CM | POA: Diagnosis not present

## 2014-05-31 DIAGNOSIS — N186 End stage renal disease: Secondary | ICD-10-CM | POA: Diagnosis not present

## 2014-06-02 DIAGNOSIS — D509 Iron deficiency anemia, unspecified: Secondary | ICD-10-CM | POA: Diagnosis not present

## 2014-06-02 DIAGNOSIS — N186 End stage renal disease: Secondary | ICD-10-CM | POA: Diagnosis not present

## 2014-06-02 DIAGNOSIS — D631 Anemia in chronic kidney disease: Secondary | ICD-10-CM | POA: Diagnosis not present

## 2014-06-04 DIAGNOSIS — N186 End stage renal disease: Secondary | ICD-10-CM | POA: Diagnosis not present

## 2014-06-04 DIAGNOSIS — D509 Iron deficiency anemia, unspecified: Secondary | ICD-10-CM | POA: Diagnosis not present

## 2014-06-04 DIAGNOSIS — D631 Anemia in chronic kidney disease: Secondary | ICD-10-CM | POA: Diagnosis not present

## 2014-06-05 ENCOUNTER — Other Ambulatory Visit: Payer: Self-pay | Admitting: Internal Medicine

## 2014-06-07 DIAGNOSIS — D631 Anemia in chronic kidney disease: Secondary | ICD-10-CM | POA: Diagnosis not present

## 2014-06-07 DIAGNOSIS — N186 End stage renal disease: Secondary | ICD-10-CM | POA: Diagnosis not present

## 2014-06-07 DIAGNOSIS — D509 Iron deficiency anemia, unspecified: Secondary | ICD-10-CM | POA: Diagnosis not present

## 2014-06-08 NOTE — Telephone Encounter (Signed)
Pt jut received percocet per Dr Tamala Julian  I cannot refill tramadol at this time

## 2014-06-13 DIAGNOSIS — N186 End stage renal disease: Secondary | ICD-10-CM | POA: Diagnosis not present

## 2014-06-16 DIAGNOSIS — I509 Heart failure, unspecified: Secondary | ICD-10-CM | POA: Diagnosis not present

## 2014-06-16 DIAGNOSIS — E119 Type 2 diabetes mellitus without complications: Secondary | ICD-10-CM | POA: Diagnosis not present

## 2014-06-16 DIAGNOSIS — N186 End stage renal disease: Secondary | ICD-10-CM | POA: Diagnosis not present

## 2014-06-16 DIAGNOSIS — Z992 Dependence on renal dialysis: Secondary | ICD-10-CM | POA: Diagnosis not present

## 2014-06-16 DIAGNOSIS — R600 Localized edema: Secondary | ICD-10-CM | POA: Diagnosis not present

## 2014-06-16 DIAGNOSIS — I517 Cardiomegaly: Secondary | ICD-10-CM | POA: Diagnosis not present

## 2014-06-16 DIAGNOSIS — I120 Hypertensive chronic kidney disease with stage 5 chronic kidney disease or end stage renal disease: Secondary | ICD-10-CM | POA: Diagnosis not present

## 2014-06-16 DIAGNOSIS — I10 Essential (primary) hypertension: Secondary | ICD-10-CM | POA: Diagnosis not present

## 2014-06-18 DIAGNOSIS — N186 End stage renal disease: Secondary | ICD-10-CM | POA: Diagnosis not present

## 2014-06-22 DIAGNOSIS — E784 Other hyperlipidemia: Secondary | ICD-10-CM | POA: Diagnosis not present

## 2014-06-22 DIAGNOSIS — N186 End stage renal disease: Secondary | ICD-10-CM | POA: Diagnosis not present

## 2014-06-24 DIAGNOSIS — N186 End stage renal disease: Secondary | ICD-10-CM | POA: Diagnosis not present

## 2014-06-26 DIAGNOSIS — N186 End stage renal disease: Secondary | ICD-10-CM | POA: Diagnosis not present

## 2014-06-28 ENCOUNTER — Other Ambulatory Visit: Payer: Self-pay | Admitting: Internal Medicine

## 2014-06-28 ENCOUNTER — Other Ambulatory Visit: Payer: Self-pay | Admitting: Family Medicine

## 2014-06-28 NOTE — Telephone Encounter (Signed)
Dr. Tamala Julian refilled this medication last year when he was seen. He has not been seen but Dr. Tamala Julian or you recently.

## 2014-06-28 NOTE — Telephone Encounter (Signed)
Refill done erx, pt last seen sept 2015 by me  tammy to contact pt - needs ROV, f/u DM

## 2014-06-29 DIAGNOSIS — I120 Hypertensive chronic kidney disease with stage 5 chronic kidney disease or end stage renal disease: Secondary | ICD-10-CM | POA: Diagnosis not present

## 2014-06-29 DIAGNOSIS — N2581 Secondary hyperparathyroidism of renal origin: Secondary | ICD-10-CM | POA: Diagnosis not present

## 2014-06-29 DIAGNOSIS — D509 Iron deficiency anemia, unspecified: Secondary | ICD-10-CM | POA: Diagnosis not present

## 2014-06-29 DIAGNOSIS — N186 End stage renal disease: Secondary | ICD-10-CM | POA: Diagnosis not present

## 2014-06-29 DIAGNOSIS — D631 Anemia in chronic kidney disease: Secondary | ICD-10-CM | POA: Diagnosis not present

## 2014-06-29 DIAGNOSIS — Z992 Dependence on renal dialysis: Secondary | ICD-10-CM | POA: Diagnosis not present

## 2014-06-29 NOTE — Telephone Encounter (Signed)
Done hardcopy to dahlia/LIM B  

## 2014-06-30 NOTE — Telephone Encounter (Signed)
Rx faxed to pharmacy  

## 2014-07-01 DIAGNOSIS — N186 End stage renal disease: Secondary | ICD-10-CM | POA: Diagnosis not present

## 2014-07-01 DIAGNOSIS — I4891 Unspecified atrial fibrillation: Secondary | ICD-10-CM | POA: Diagnosis not present

## 2014-07-01 DIAGNOSIS — D631 Anemia in chronic kidney disease: Secondary | ICD-10-CM | POA: Diagnosis not present

## 2014-07-01 DIAGNOSIS — D509 Iron deficiency anemia, unspecified: Secondary | ICD-10-CM | POA: Diagnosis not present

## 2014-07-01 DIAGNOSIS — N2581 Secondary hyperparathyroidism of renal origin: Secondary | ICD-10-CM | POA: Diagnosis not present

## 2014-07-02 ENCOUNTER — Ambulatory Visit: Payer: Medicare Other | Admitting: Internal Medicine

## 2014-07-03 DIAGNOSIS — D631 Anemia in chronic kidney disease: Secondary | ICD-10-CM | POA: Diagnosis not present

## 2014-07-03 DIAGNOSIS — D509 Iron deficiency anemia, unspecified: Secondary | ICD-10-CM | POA: Diagnosis not present

## 2014-07-03 DIAGNOSIS — N186 End stage renal disease: Secondary | ICD-10-CM | POA: Diagnosis not present

## 2014-07-03 DIAGNOSIS — N2581 Secondary hyperparathyroidism of renal origin: Secondary | ICD-10-CM | POA: Diagnosis not present

## 2014-07-05 ENCOUNTER — Ambulatory Visit (INDEPENDENT_AMBULATORY_CARE_PROVIDER_SITE_OTHER): Payer: Medicare Other | Admitting: General Practice

## 2014-07-05 DIAGNOSIS — Z5181 Encounter for therapeutic drug level monitoring: Secondary | ICD-10-CM

## 2014-07-05 DIAGNOSIS — I272 Pulmonary hypertension, unspecified: Secondary | ICD-10-CM

## 2014-07-05 DIAGNOSIS — I639 Cerebral infarction, unspecified: Secondary | ICD-10-CM

## 2014-07-05 DIAGNOSIS — I270 Primary pulmonary hypertension: Secondary | ICD-10-CM

## 2014-07-05 DIAGNOSIS — I4891 Unspecified atrial fibrillation: Secondary | ICD-10-CM

## 2014-07-05 LAB — POCT INR: INR: 3.85

## 2014-07-05 NOTE — Progress Notes (Signed)
Pre visit review using our clinic review tool, if applicable. No additional management support is needed unless otherwise documented below in the visit note. 

## 2014-07-06 DIAGNOSIS — D509 Iron deficiency anemia, unspecified: Secondary | ICD-10-CM | POA: Diagnosis not present

## 2014-07-06 DIAGNOSIS — N186 End stage renal disease: Secondary | ICD-10-CM | POA: Diagnosis not present

## 2014-07-06 DIAGNOSIS — D631 Anemia in chronic kidney disease: Secondary | ICD-10-CM | POA: Diagnosis not present

## 2014-07-06 DIAGNOSIS — N2581 Secondary hyperparathyroidism of renal origin: Secondary | ICD-10-CM | POA: Diagnosis not present

## 2014-07-07 ENCOUNTER — Telehealth: Payer: Self-pay

## 2014-07-07 ENCOUNTER — Ambulatory Visit (INDEPENDENT_AMBULATORY_CARE_PROVIDER_SITE_OTHER): Payer: Medicare Other | Admitting: Internal Medicine

## 2014-07-07 ENCOUNTER — Other Ambulatory Visit: Payer: Self-pay | Admitting: Internal Medicine

## 2014-07-07 ENCOUNTER — Encounter: Payer: Self-pay | Admitting: Internal Medicine

## 2014-07-07 ENCOUNTER — Other Ambulatory Visit (INDEPENDENT_AMBULATORY_CARE_PROVIDER_SITE_OTHER): Payer: Medicare Other

## 2014-07-07 VITALS — BP 132/80 | HR 89 | Temp 98.8°F | Wt 266.1 lb

## 2014-07-07 DIAGNOSIS — E1340 Other specified diabetes mellitus with diabetic neuropathy, unspecified: Secondary | ICD-10-CM

## 2014-07-07 DIAGNOSIS — I639 Cerebral infarction, unspecified: Secondary | ICD-10-CM

## 2014-07-07 DIAGNOSIS — D6489 Other specified anemias: Secondary | ICD-10-CM

## 2014-07-07 DIAGNOSIS — N320 Bladder-neck obstruction: Secondary | ICD-10-CM

## 2014-07-07 DIAGNOSIS — J309 Allergic rhinitis, unspecified: Secondary | ICD-10-CM

## 2014-07-07 DIAGNOSIS — I10 Essential (primary) hypertension: Secondary | ICD-10-CM | POA: Diagnosis not present

## 2014-07-07 DIAGNOSIS — D638 Anemia in other chronic diseases classified elsewhere: Secondary | ICD-10-CM | POA: Diagnosis present

## 2014-07-07 DIAGNOSIS — R972 Elevated prostate specific antigen [PSA]: Secondary | ICD-10-CM

## 2014-07-07 DIAGNOSIS — R7989 Other specified abnormal findings of blood chemistry: Secondary | ICD-10-CM

## 2014-07-07 DIAGNOSIS — E785 Hyperlipidemia, unspecified: Secondary | ICD-10-CM | POA: Diagnosis not present

## 2014-07-07 DIAGNOSIS — D631 Anemia in chronic kidney disease: Secondary | ICD-10-CM | POA: Insufficient documentation

## 2014-07-07 DIAGNOSIS — N189 Chronic kidney disease, unspecified: Secondary | ICD-10-CM

## 2014-07-07 HISTORY — DX: Allergic rhinitis, unspecified: J30.9

## 2014-07-07 HISTORY — DX: Other specified anemias: D64.89

## 2014-07-07 LAB — CBC WITH DIFFERENTIAL/PLATELET
BASOS ABS: 0.1 10*3/uL (ref 0.0–0.1)
Basophils Relative: 1 % (ref 0.0–3.0)
Eosinophils Absolute: 0.2 10*3/uL (ref 0.0–0.7)
Eosinophils Relative: 3.3 % (ref 0.0–5.0)
HEMATOCRIT: 32 % — AB (ref 39.0–52.0)
Hemoglobin: 10.4 g/dL — ABNORMAL LOW (ref 13.0–17.0)
LYMPHS ABS: 1.2 10*3/uL (ref 0.7–4.0)
LYMPHS PCT: 17.6 % (ref 12.0–46.0)
MCHC: 32.5 g/dL (ref 30.0–36.0)
MCV: 95.3 fl (ref 78.0–100.0)
Monocytes Absolute: 0.7 10*3/uL (ref 0.1–1.0)
Monocytes Relative: 10.6 % (ref 3.0–12.0)
NEUTROS PCT: 67.5 % (ref 43.0–77.0)
Neutro Abs: 4.7 10*3/uL (ref 1.4–7.7)
Platelets: 240 10*3/uL (ref 150.0–400.0)
RBC: 3.36 Mil/uL — ABNORMAL LOW (ref 4.22–5.81)
RDW: 17.4 % — AB (ref 11.5–15.5)
WBC: 7 10*3/uL (ref 4.0–10.5)

## 2014-07-07 LAB — HEMOGLOBIN A1C: HEMOGLOBIN A1C: 6.6 % — AB (ref 4.6–6.5)

## 2014-07-07 LAB — TSH: TSH: 0.03 u[IU]/mL — ABNORMAL LOW (ref 0.35–4.50)

## 2014-07-07 LAB — BASIC METABOLIC PANEL
BUN: 28 mg/dL — AB (ref 6–23)
CALCIUM: 9.2 mg/dL (ref 8.4–10.5)
CHLORIDE: 93 meq/L — AB (ref 96–112)
CO2: 32 meq/L (ref 19–32)
Creatinine, Ser: 5.17 mg/dL (ref 0.40–1.50)
GFR: 14.73 mL/min — AB (ref >60.00)
Glucose, Bld: 136 mg/dL — ABNORMAL HIGH (ref 70–99)
Potassium: 4.6 mEq/L (ref 3.5–5.1)
SODIUM: 134 meq/L — AB (ref 135–145)

## 2014-07-07 LAB — LIPID PANEL
CHOLESTEROL: 92 mg/dL (ref 0–200)
HDL: 38.1 mg/dL — AB (ref >39.00)
LDL Cholesterol: 37 mg/dL (ref 0–99)
NonHDL: 53.9
Total CHOL/HDL Ratio: 2
Triglycerides: 83 mg/dL (ref 0.0–149.0)
VLDL: 16.6 mg/dL (ref 0.0–40.0)

## 2014-07-07 LAB — HEPATIC FUNCTION PANEL
ALK PHOS: 159 U/L — AB (ref 39–117)
ALT: 15 U/L (ref 0–53)
AST: 12 U/L (ref 0–37)
Albumin: 3.7 g/dL (ref 3.5–5.2)
Bilirubin, Direct: 0.1 mg/dL (ref 0.0–0.3)
Total Bilirubin: 0.3 mg/dL (ref 0.2–1.2)
Total Protein: 6.9 g/dL (ref 6.0–8.3)

## 2014-07-07 LAB — PSA: PSA: 5.67 ng/mL — ABNORMAL HIGH (ref 0.10–4.00)

## 2014-07-07 MED ORDER — CETIRIZINE HCL 10 MG PO TABS
10.0000 mg | ORAL_TABLET | Freq: Every day | ORAL | Status: DC
Start: 2014-07-07 — End: 2014-11-08

## 2014-07-07 MED ORDER — TRIAMCINOLONE ACETONIDE 55 MCG/ACT NA AERO: 2.0000 | INHALATION_SPRAY | Freq: Every day | NASAL | Status: DC

## 2014-07-07 NOTE — Progress Notes (Signed)
Subjective:    Patient ID: Daniel Whitney, male    DOB: January 10, 1956, 59 y.o.   MRN: [id number]  HPI  Here to f/u; overall doing ok,  Pt denies chest pain, increasing sob or doe, wheezing, orthopnea, PND, increased LE swelling, palpitations, dizziness or syncope.  Pt denies new neurological symptoms such as new headache, or facial or extremity weakness or numbness.  Pt denies polydipsia, polyuria, or low sugar episode.   Pt denies new neurological symptoms such as new headache, or facial or extremity weakness or numbness.   Pt states overall good compliance with meds, mostly trying to follow appropriate diet, with wt overall stable,  but little exercise however. 2 sisters died recently with CHF.No further gout symptoms sonce sept 2015. Does have several wks ongoing nasal allergy symptoms with clearish congestion, itch and sneezing, without fever, pain, ST, cough, swelling or wheezing. Had flare painful neuropathy while recently in [place], asks for incresae elavil to 100 mg. Pt plans to f/u with Dr Tamala Julian, but has felt he likely will assent to knee surgury at some point this yr.  Lab reivew indictes mild worsening anemia,    Lab Results  Component Value Date   WBC 15.6* 01/31/2014   HGB 11.0* 01/31/2014   HCT 33.7* 01/31/2014   MCV 97.4 01/31/2014   PLT 311 01/31/2014  Declines colonoscopy Past Medical History  Diagnosis Date  . Glaucoma   . Hypertension   . Hyperlipidemia   . Atrial fibrillation 12/30/2012  . CHF (congestive heart failure) 12/30/2012  . Diabetic retinopathy 12/30/2012  . Morbid obesity 12/30/2012  . Pulmonary hypertension 12/30/2012  . Myocardial infarction     "I've had ~ 3; last one was in ~ 10/2013" (01/30/2014)  . Diabetes mellitus with neuropathy   . OSA (obstructive sleep apnea) 12/30/2012    "lost weight; no longer needed CPAP; retested said I needed it; didn't followup cause I was feeling fine" (01/30/2014)  . GERD (gastroesophageal reflux disease)   . Stroke ~ 2005; ~  2005    "they were mild; I didn't even notice I'd had them"; denies residual on 01/30/2014  . History of gout     "right big toe"  . Arthritis     "all over"  . Chronic lower back pain   . ESRD (end stage renal disease) on dialysis started in 2013    "MWF; [facility]; [address]" (01/30/2014)  . Refusal of blood transfusions as patient is Jehovah's Witness    Past Surgical History  Procedure Laterality Date  . Total hip arthroplasty Right 1980  . Av fistula placement Left ~ 10/2013    "forearm"  . Closed reduction hip dislocation Right 1970's    reports that he has never smoked. He has never used smokeless tobacco. He reports that he drinks about 1.5 - 2.0 oz of alcohol per week. He reports that he does not use illicit drugs. family history includes Arthritis in his other; Diabetes in his other; Heart disease in his other; Hyperlipidemia in his other; Hypertension in his other; Kidney disease in his other; Stroke in his other. No Known Allergies Current Outpatient Prescriptions on File Prior to Visit  Medication Sig Dispense Refill  . albuterol (PROVENTIL HFA;VENTOLIN HFA) 108 (90 BASE) MCG/ACT inhaler Inhale 2 puffs into the lungs every 6 (six) hours as needed for wheezing or shortness of breath. 1 Inhaler 0  . allopurinol (ZYLOPRIM) 100 MG tablet TAKE ONE TABLET BY MOUTH TWICE DAILY 180 tablet 0  . amiodarone (PACERONE)  200 MG tablet Take 1 tablet (200 mg total) by mouth daily. 90 tablet 1  . amitriptyline (ELAVIL) 50 MG tablet TAKE ONE TABLET BY MOUTH ONCE DAILY AT BEDTIME 90 tablet 0  . Ascorbic Acid (VITAMIN C) 1000 MG tablet Take 1,000 mg by mouth daily.    Marland Kitchen b complex-vitamin c-folic acid (NEPHRO-VITE) 0.8 MG TABS tablet Take 1 tablet by mouth daily.    . carvedilol (COREG) 25 MG tablet Take 25 mg by mouth 2 (two) times daily with a meal.    . clindamycin (CLEOCIN T) 1 % lotion Apply topically 2 (two) times daily. 60 mL 0  . dextromethorphan-guaiFENesin (MUCINEX DM) 30-600 MG  per 12 hr tablet Take 1 tablet by mouth 2 (two) times daily. 10 tablet 0  . Eflornithine HCl (VANIQA) 13.9 % cream Apply 1 application topically 2 (two) times daily with a meal.     . fluticasone (FLONASE) 50 MCG/ACT nasal spray Place 2 sprays into both nostrils daily. (Patient not taking: Reported on 07/07/2014) 16 g 2  . JANUVIA 25 MG tablet TAKE ONE TABLET BY MOUTH ONCE DAILY 90 tablet 0  . lidocaine (LIDODERM) 5 % Place 1 patch onto the skin daily. Remove & Discard patch within 12 hours or as directed by MD (Patient not taking: Reported on 07/07/2014) 30 patch 0  . loratadine (CLARITIN) 10 MG tablet Take 1 tablet (10 mg total) by mouth daily. (Patient not taking: Reported on 07/07/2014) 5 tablet 0  . Multiple Vitamin (MULTIVITAMIN WITH MINERALS) TABS tablet Take 1 tablet by mouth daily.    Marland Kitchen neomycin-polymyxin-hydrocortisone (CORTISPORIN) otic solution Place 4 drops into the right ear 4 (four) times daily. (Patient not taking: Reported on 07/07/2014) 10 mL 0  . ondansetron (ZOFRAN) 4 MG tablet Take 1 tablet (4 mg total) by mouth every 6 (six) hours. (Patient not taking: Reported on 07/07/2014) 12 tablet 0  . OVER THE COUNTER MEDICATION 2 (two) times daily. Benzaderm Wash    . OVER THE COUNTER MEDICATION 1 application 2 (two) times daily. Melaquin Complex    . oxyCODONE-acetaminophen (PERCOCET) 5-325 MG per tablet Take 1-2 tablets by mouth every 4 (four) hours as needed. (Patient not taking: Reported on 07/07/2014) 40 tablet 0  . PRESCRIPTION MEDICATION every Monday, Wednesday, and Friday. Dialysis    . sevelamer carbonate (RENVELA) 800 MG tablet Take 1,600 mg by mouth 3 (three) times daily with meals.     . simvastatin (ZOCOR) 20 MG tablet TAKE ONE TABLET BY MOUTH ONCE DAILY 90 tablet 0  . sodium chloride (OCEAN) 0.65 % SOLN nasal spray Place 1 spray into both nostrils as needed for congestion. 104 mL 0  . tadalafil (CIALIS) 20 MG tablet Take 1 tablet (20 mg total) by mouth daily as needed for erectile  dysfunction. 5 tablet 11  . TraMADol HCl 300 MG CP24 TAKE ONE CAPSULE BY MOUTH ONCE DAILY 90 capsule 2  . valsartan (DIOVAN) 40 MG tablet Take 40 mg by mouth 3 (three) times a week. Takes on Sunday, Tuesday, and Saturday    . warfarin (COUMADIN) 3 MG tablet Take 1-1.5 tablets (3-4.5 mg total) by mouth See admin instructions. Please hold Coumadin 01/31/14-take 0.5 tablet on 02/01/14-and then resume usual dosing of on Thursday 1.5 tablet and on the remaining days you take 1 tabletTake as directed by anticoagulation clinic. Please make sure you check your INR in the next 2 days 20 tablet 0   No current facility-administered medications on file prior to visit.  Review of Systems  Constitutional: Negative for unusual diaphoresis or night sweats HENT: Negative for ringing in ear or discharge Eyes: Negative for double vision or worsening visual disturbance.  Respiratory: Negative for choking and stridor.   Gastrointestinal: Negative for vomiting or other signifcant bowel change Genitourinary: Negative for hematuria or change in urine volume.  Musculoskeletal: Negative for other MSK pain or swelling Skin: Negative for color change and worsening wound.  Neurological: Negative for tremors and numbness other than noted  Psychiatric/Behavioral: Negative for decreased concentration or agitation other than above       Objective:   Physical Exam BP 132/80 mmHg  Pulse 89  Temp(Src) 98.8 F (37.1 C) (Oral)  Wt 266 lb 1.9 oz (120.711 kg)  SpO2 95% VS noted, walks with cane Constitutional: Pt appears in no significant distress HENT: Head: NCAT.  Right Ear: External ear normal.  Left Ear: External ear normal.  Eyes: . Pupils are equal, round, and reactive to light. Conjunctivae and EOM are normal Neck: Normal range of motion. Neck supple.  Cardiovascular: Normal rate and regular rhythm.   Pulmonary/Chest: Effort normal and breath sounds without rales or wheezing.  Abd:  Soft, NT, ND, +  BS Neurological: Pt is alert. Not confused , motor grossly intact, walks with painful knees, Skin: Skin is warm. No rash, trace LLE edema Psychiatric: Pt behavior is normal. No agitation.      Assessment & Plan:

## 2014-07-07 NOTE — Patient Instructions (Signed)
Please take all new medication as prescribed - the zyrtec and nasacort for allergies  Please continue all other medications as before, and refills have been done if requested.  Please have the pharmacy call with any other refills you may need.  Please continue your efforts at being more active, low cholesterol diet, and weight control.  Please keep your appointments with your specialists as you may have planned  Please go to the LAB in the Basement (turn left off the elevator) for the tests to be done today  You will be contacted by phone if any changes need to be made immediately.  Otherwise, you will receive a letter about your results with an explanation, but please check with MyChart first.  Please remember to sign up for MyChart if you have not done so, as this will be important to you in the future with finding out test results, communicating by private email, and scheduling acute appointments online when needed.  Please return in 6 months, or sooner if needed

## 2014-07-07 NOTE — Assessment & Plan Note (Signed)
Mild to mod, for zyrtec/nasacort asd,  to f/u any worsening symptoms or concerns 

## 2014-07-07 NOTE — Assessment & Plan Note (Signed)
?   Related to renal dz, for f/u cbc today

## 2014-07-07 NOTE — Assessment & Plan Note (Signed)
stable overall by history and exam, recent data reviewed with pt, and pt to continue medical treatment as before,  to f/u any worsening symptoms or concerns Lab Results  Component Value Date   HGBA1C 6.5* 01/29/2014

## 2014-07-07 NOTE — Assessment & Plan Note (Signed)
stable overall by history and exam, recent data reviewed with pt, and pt to continue medical treatment as before,  to f/u any worsening symptoms or concerns BP Readings from Last 3 Encounters:  07/07/14 132/80  04/27/14 140/72  04/25/14 139/93

## 2014-07-07 NOTE — Assessment & Plan Note (Signed)
.  stable overall by history and exam, recent data reviewed with pt, and pt to continue medical treatment as before,  to f/u any worsening symptoms or concerns Lab Results  Component Value Date   LDLCALC 54 12/30/2012   For f/u lipid today

## 2014-07-07 NOTE — Telephone Encounter (Signed)
Pt with known dialysis

## 2014-07-07 NOTE — Telephone Encounter (Signed)
Lab called to report pt's critical levels of Creatinine 5.25 and GFR 14.47

## 2014-07-07 NOTE — Progress Notes (Signed)
Pre visit review using our clinic review tool, if applicable. No additional management support is needed unless otherwise documented below in the visit note. 

## 2014-07-08 DIAGNOSIS — I4891 Unspecified atrial fibrillation: Secondary | ICD-10-CM | POA: Diagnosis not present

## 2014-07-08 DIAGNOSIS — D631 Anemia in chronic kidney disease: Secondary | ICD-10-CM | POA: Diagnosis not present

## 2014-07-08 DIAGNOSIS — N186 End stage renal disease: Secondary | ICD-10-CM | POA: Diagnosis not present

## 2014-07-08 DIAGNOSIS — D509 Iron deficiency anemia, unspecified: Secondary | ICD-10-CM | POA: Diagnosis not present

## 2014-07-08 DIAGNOSIS — N2581 Secondary hyperparathyroidism of renal origin: Secondary | ICD-10-CM | POA: Diagnosis not present

## 2014-07-10 DIAGNOSIS — N186 End stage renal disease: Secondary | ICD-10-CM | POA: Diagnosis not present

## 2014-07-10 DIAGNOSIS — D509 Iron deficiency anemia, unspecified: Secondary | ICD-10-CM | POA: Diagnosis not present

## 2014-07-10 DIAGNOSIS — D631 Anemia in chronic kidney disease: Secondary | ICD-10-CM | POA: Diagnosis not present

## 2014-07-10 DIAGNOSIS — N2581 Secondary hyperparathyroidism of renal origin: Secondary | ICD-10-CM | POA: Diagnosis not present

## 2014-07-13 DIAGNOSIS — D631 Anemia in chronic kidney disease: Secondary | ICD-10-CM | POA: Diagnosis not present

## 2014-07-13 DIAGNOSIS — N2581 Secondary hyperparathyroidism of renal origin: Secondary | ICD-10-CM | POA: Diagnosis not present

## 2014-07-13 DIAGNOSIS — D509 Iron deficiency anemia, unspecified: Secondary | ICD-10-CM | POA: Diagnosis not present

## 2014-07-13 DIAGNOSIS — N186 End stage renal disease: Secondary | ICD-10-CM | POA: Diagnosis not present

## 2014-07-15 DIAGNOSIS — D509 Iron deficiency anemia, unspecified: Secondary | ICD-10-CM | POA: Diagnosis not present

## 2014-07-15 DIAGNOSIS — N2581 Secondary hyperparathyroidism of renal origin: Secondary | ICD-10-CM | POA: Diagnosis not present

## 2014-07-15 DIAGNOSIS — N186 End stage renal disease: Secondary | ICD-10-CM | POA: Diagnosis not present

## 2014-07-15 DIAGNOSIS — D631 Anemia in chronic kidney disease: Secondary | ICD-10-CM | POA: Diagnosis not present

## 2014-07-16 ENCOUNTER — Encounter: Payer: Self-pay | Admitting: Internal Medicine

## 2014-07-17 DIAGNOSIS — D509 Iron deficiency anemia, unspecified: Secondary | ICD-10-CM | POA: Diagnosis not present

## 2014-07-17 DIAGNOSIS — D631 Anemia in chronic kidney disease: Secondary | ICD-10-CM | POA: Diagnosis not present

## 2014-07-17 DIAGNOSIS — N2581 Secondary hyperparathyroidism of renal origin: Secondary | ICD-10-CM | POA: Diagnosis not present

## 2014-07-17 DIAGNOSIS — N186 End stage renal disease: Secondary | ICD-10-CM | POA: Diagnosis not present

## 2014-07-20 DIAGNOSIS — N186 End stage renal disease: Secondary | ICD-10-CM | POA: Diagnosis not present

## 2014-07-20 DIAGNOSIS — N2581 Secondary hyperparathyroidism of renal origin: Secondary | ICD-10-CM | POA: Diagnosis not present

## 2014-07-20 DIAGNOSIS — D631 Anemia in chronic kidney disease: Secondary | ICD-10-CM | POA: Diagnosis not present

## 2014-07-20 DIAGNOSIS — D509 Iron deficiency anemia, unspecified: Secondary | ICD-10-CM | POA: Diagnosis not present

## 2014-07-21 ENCOUNTER — Ambulatory Visit: Payer: Medicare Other | Admitting: Family Medicine

## 2014-07-21 ENCOUNTER — Ambulatory Visit: Payer: Medicare Other | Admitting: Endocrinology

## 2014-07-21 DIAGNOSIS — Z0289 Encounter for other administrative examinations: Secondary | ICD-10-CM

## 2014-07-22 DIAGNOSIS — N186 End stage renal disease: Secondary | ICD-10-CM | POA: Diagnosis not present

## 2014-07-22 DIAGNOSIS — D509 Iron deficiency anemia, unspecified: Secondary | ICD-10-CM | POA: Diagnosis not present

## 2014-07-22 DIAGNOSIS — N2581 Secondary hyperparathyroidism of renal origin: Secondary | ICD-10-CM | POA: Diagnosis not present

## 2014-07-22 DIAGNOSIS — D631 Anemia in chronic kidney disease: Secondary | ICD-10-CM | POA: Diagnosis not present

## 2014-07-24 DIAGNOSIS — D509 Iron deficiency anemia, unspecified: Secondary | ICD-10-CM | POA: Diagnosis not present

## 2014-07-24 DIAGNOSIS — N186 End stage renal disease: Secondary | ICD-10-CM | POA: Diagnosis not present

## 2014-07-24 DIAGNOSIS — N2581 Secondary hyperparathyroidism of renal origin: Secondary | ICD-10-CM | POA: Diagnosis not present

## 2014-07-24 DIAGNOSIS — D631 Anemia in chronic kidney disease: Secondary | ICD-10-CM | POA: Diagnosis not present

## 2014-07-27 DIAGNOSIS — D631 Anemia in chronic kidney disease: Secondary | ICD-10-CM | POA: Diagnosis not present

## 2014-07-27 DIAGNOSIS — N186 End stage renal disease: Secondary | ICD-10-CM | POA: Diagnosis not present

## 2014-07-27 DIAGNOSIS — D509 Iron deficiency anemia, unspecified: Secondary | ICD-10-CM | POA: Diagnosis not present

## 2014-07-27 DIAGNOSIS — N2581 Secondary hyperparathyroidism of renal origin: Secondary | ICD-10-CM | POA: Diagnosis not present

## 2014-07-29 DIAGNOSIS — D509 Iron deficiency anemia, unspecified: Secondary | ICD-10-CM | POA: Diagnosis not present

## 2014-07-29 DIAGNOSIS — D631 Anemia in chronic kidney disease: Secondary | ICD-10-CM | POA: Diagnosis not present

## 2014-07-29 DIAGNOSIS — N186 End stage renal disease: Secondary | ICD-10-CM | POA: Diagnosis not present

## 2014-07-29 DIAGNOSIS — N2581 Secondary hyperparathyroidism of renal origin: Secondary | ICD-10-CM | POA: Diagnosis not present

## 2014-07-31 ENCOUNTER — Encounter (HOSPITAL_COMMUNITY): Payer: Self-pay | Admitting: Emergency Medicine

## 2014-07-31 ENCOUNTER — Emergency Department (HOSPITAL_COMMUNITY)
Admission: EM | Admit: 2014-07-31 | Discharge: 2014-07-31 | Disposition: A | Discharge status: Home / Self Care | Payer: Medicare Other | Attending: Emergency Medicine | Admitting: Emergency Medicine

## 2014-07-31 ENCOUNTER — Emergency Department (HOSPITAL_COMMUNITY): Payer: Medicare Other

## 2014-07-31 DIAGNOSIS — M545 Low back pain, unspecified: Secondary | ICD-10-CM

## 2014-07-31 DIAGNOSIS — Y9389 Activity, other specified: Secondary | ICD-10-CM | POA: Insufficient documentation

## 2014-07-31 DIAGNOSIS — S299XXA Unspecified injury of thorax, initial encounter: Secondary | ICD-10-CM | POA: Diagnosis not present

## 2014-07-31 DIAGNOSIS — Z7901 Long term (current) use of anticoagulants: Secondary | ICD-10-CM | POA: Insufficient documentation

## 2014-07-31 DIAGNOSIS — Z992 Dependence on renal dialysis: Secondary | ICD-10-CM | POA: Insufficient documentation

## 2014-07-31 DIAGNOSIS — M10071 Idiopathic gout, right ankle and foot: Secondary | ICD-10-CM | POA: Diagnosis not present

## 2014-07-31 DIAGNOSIS — S3992XA Unspecified injury of lower back, initial encounter: Secondary | ICD-10-CM | POA: Diagnosis not present

## 2014-07-31 DIAGNOSIS — S32030A Wedge compression fracture of third lumbar vertebra, initial encounter for closed fracture: Secondary | ICD-10-CM

## 2014-07-31 DIAGNOSIS — N186 End stage renal disease: Secondary | ICD-10-CM | POA: Diagnosis not present

## 2014-07-31 DIAGNOSIS — S8992XA Unspecified injury of left lower leg, initial encounter: Secondary | ICD-10-CM | POA: Insufficient documentation

## 2014-07-31 DIAGNOSIS — Y92009 Unspecified place in unspecified non-institutional (private) residence as the place of occurrence of the external cause: Secondary | ICD-10-CM | POA: Diagnosis not present

## 2014-07-31 DIAGNOSIS — W1839XA Other fall on same level, initial encounter: Secondary | ICD-10-CM | POA: Diagnosis not present

## 2014-07-31 DIAGNOSIS — I509 Heart failure, unspecified: Secondary | ICD-10-CM | POA: Diagnosis not present

## 2014-07-31 DIAGNOSIS — E1140 Type 2 diabetes mellitus with diabetic neuropathy, unspecified: Secondary | ICD-10-CM | POA: Diagnosis not present

## 2014-07-31 DIAGNOSIS — Z8719 Personal history of other diseases of the digestive system: Secondary | ICD-10-CM | POA: Insufficient documentation

## 2014-07-31 DIAGNOSIS — I120 Hypertensive chronic kidney disease with stage 5 chronic kidney disease or end stage renal disease: Secondary | ICD-10-CM | POA: Diagnosis not present

## 2014-07-31 DIAGNOSIS — M1990 Unspecified osteoarthritis, unspecified site: Secondary | ICD-10-CM | POA: Insufficient documentation

## 2014-07-31 DIAGNOSIS — M25562 Pain in left knee: Secondary | ICD-10-CM

## 2014-07-31 DIAGNOSIS — Y998 Other external cause status: Secondary | ICD-10-CM | POA: Insufficient documentation

## 2014-07-31 DIAGNOSIS — M25552 Pain in left hip: Secondary | ICD-10-CM | POA: Diagnosis not present

## 2014-07-31 DIAGNOSIS — Z7951 Long term (current) use of inhaled steroids: Secondary | ICD-10-CM | POA: Diagnosis not present

## 2014-07-31 DIAGNOSIS — M25551 Pain in right hip: Secondary | ICD-10-CM | POA: Diagnosis not present

## 2014-07-31 DIAGNOSIS — I272 Other secondary pulmonary hypertension: Secondary | ICD-10-CM | POA: Insufficient documentation

## 2014-07-31 DIAGNOSIS — I252 Old myocardial infarction: Secondary | ICD-10-CM | POA: Insufficient documentation

## 2014-07-31 DIAGNOSIS — S32000A Wedge compression fracture of unspecified lumbar vertebra, initial encounter for closed fracture: Secondary | ICD-10-CM | POA: Diagnosis not present

## 2014-07-31 DIAGNOSIS — G8929 Other chronic pain: Secondary | ICD-10-CM | POA: Insufficient documentation

## 2014-07-31 DIAGNOSIS — W19XXXA Unspecified fall, initial encounter: Secondary | ICD-10-CM

## 2014-07-31 DIAGNOSIS — E785 Hyperlipidemia, unspecified: Secondary | ICD-10-CM | POA: Insufficient documentation

## 2014-07-31 DIAGNOSIS — I4891 Unspecified atrial fibrillation: Secondary | ICD-10-CM | POA: Diagnosis not present

## 2014-07-31 DIAGNOSIS — Z79899 Other long term (current) drug therapy: Secondary | ICD-10-CM | POA: Diagnosis not present

## 2014-07-31 DIAGNOSIS — M546 Pain in thoracic spine: Secondary | ICD-10-CM | POA: Diagnosis not present

## 2014-07-31 DIAGNOSIS — S79911A Unspecified injury of right hip, initial encounter: Secondary | ICD-10-CM | POA: Diagnosis not present

## 2014-07-31 DIAGNOSIS — S79912A Unspecified injury of left hip, initial encounter: Secondary | ICD-10-CM | POA: Diagnosis not present

## 2014-07-31 DIAGNOSIS — Z8673 Personal history of transient ischemic attack (TIA), and cerebral infarction without residual deficits: Secondary | ICD-10-CM | POA: Diagnosis not present

## 2014-07-31 MED ORDER — HYDROCODONE-ACETAMINOPHEN 5-325 MG PO TABS
1.0000 | ORAL_TABLET | ORAL | Status: DC | PRN
Start: 2014-07-31 — End: 2014-08-28

## 2014-07-31 MED ORDER — HYDROCODONE-ACETAMINOPHEN 5-325 MG PO TABS
1.0000 | ORAL_TABLET | Freq: Once | ORAL | Status: AC
  Administered 2014-07-31: 1 via ORAL
  Filled 2014-07-31: qty 1

## 2014-07-31 NOTE — ED Provider Notes (Signed)
CSN: [id number]     Arrival date & time 07/31/14  1914 History  This chart was scribed for non-physician practitioner, Al Corpus, PA-C,working with Wandra Arthurs, MD, by Marlowe Kays, ED Scribe. This patient was seen in room TR08C/TR08C and the patient's care was started at 8:29 PM.  Chief Complaint  Patient presents with  . Fall   The history is provided by the patient and medical records. No language interpreter was used.    HPI Comments:  Daniel Whitney is a 59 y.o. obese male with PMHx of chronic lower back pain, arthritis, DM and ESRD who presents to the Emergency Department complaining of a fall that occurred yesterday at his home. He states he was walking out of the bathroom when his left knee buckled, causing him to fall to the floor. Pt states he has ongoing problems with bilateral knees. He reports moderate lower right-sided back pain. He has not done anything to treat the pain. Elevating to a standing position makes the pain worse. He denies any alleviating factors. Denies head injury, LOC, light-headedness, HA, SOB, CP, dizziness, nausea, vomiting, abdominal pain, bowel or bladder incontinence, saddle anesthesia or numbness, tingling or weakness of the lower extremities. Pt is on Warfarin and states he has his INR checked every two weeks. Pt is normally ambulatory with a cane. Pt receives hemodialysis Mondays, Wednesdays and Fridays.  Past Medical History  Diagnosis Date  . Glaucoma   . Hypertension   . Hyperlipidemia   . Atrial fibrillation 12/30/2012  . CHF (congestive heart failure) 12/30/2012  . Diabetic retinopathy 12/30/2012  . Morbid obesity 12/30/2012  . Pulmonary hypertension 12/30/2012  . Myocardial infarction     "I've had ~ 3; last one was in ~ 10/2013" (01/30/2014)  . Diabetes mellitus with neuropathy   . OSA (obstructive sleep apnea) 12/30/2012    "lost weight; no longer needed CPAP; retested said I needed it; didn't followup cause I was feeling fine"  (01/30/2014)  . GERD (gastroesophageal reflux disease)   . Stroke ~ 2005; ~ 2005    "they were mild; I didn't even notice I'd had them"; denies residual on 01/30/2014  . History of gout     "right big toe"  . Arthritis     "all over"  . Chronic lower back pain   . ESRD (end stage renal disease) on dialysis started in 2013    "MWF; [facility]; [address]" (01/30/2014)  . Refusal of blood transfusions as patient is Jehovah's Witness   . Anemia due to other cause 07/07/2014  . Allergic rhinitis 07/07/2014   Past Surgical History  Procedure Laterality Date  . Total hip arthroplasty Right 1980  . Av fistula placement Left ~ 10/2013    "forearm"  . Closed reduction hip dislocation Right 1970's   Family History  Problem Relation Age of Onset  . Arthritis Other   . Heart disease Other   . Hyperlipidemia Other   . Stroke Other   . Hypertension Other   . Kidney disease Other   . Diabetes Other    History  Substance Use Topics  . Smoking status: Never Smoker   . Smokeless tobacco: Never Used  . Alcohol Use: 1.5 - 2.0 oz/week    3-4 Standard drinks or equivalent per week     Comment: 01/30/2014 "none in the last 3 weeks; primarily drink w/my friends on the weekends"    Review of Systems  Respiratory: Negative for shortness of breath.   Cardiovascular: Negative for chest  pain.  Gastrointestinal: Negative for nausea, vomiting and abdominal pain.  Genitourinary:       No bowel or bladder incontinence No saddle anesthesia  Musculoskeletal: Positive for back pain.  Skin: Negative for color change and wound.  Neurological: Negative for dizziness, syncope, weakness, light-headedness and numbness.   Allergies  Review of patient's allergies indicates no known allergies.  Home Medications   Prior to Admission medications   Medication Sig Start Date End Date Taking? Authorizing Provider  albuterol (PROVENTIL HFA;VENTOLIN HFA) 108 (90 BASE) MCG/ACT inhaler Inhale 2 puffs into the  lungs every 6 (six) hours as needed for wheezing or shortness of breath. 01/31/14   Shanker Kristeen Mans, MD  allopurinol (ZYLOPRIM) 100 MG tablet TAKE ONE TABLET BY MOUTH TWICE DAILY 06/28/14   Lyndal Pulley, DO  amiodarone (PACERONE) 200 MG tablet Take 1 tablet (200 mg total) by mouth daily. 11/05/13   Lyndal Pulley, DO  amitriptyline (ELAVIL) 50 MG tablet TAKE ONE TABLET BY MOUTH ONCE DAILY AT BEDTIME 06/07/14   Biagio Borg, MD  Ascorbic Acid (VITAMIN C) 1000 MG tablet Take 1,000 mg by mouth daily.    Historical Provider, MD  b complex-vitamin c-folic acid (NEPHRO-VITE) 0.8 MG TABS tablet Take 1 tablet by mouth daily.    Historical Provider, MD  carvedilol (COREG) 25 MG tablet Take 25 mg by mouth 2 (two) times daily with a meal.    Historical Provider, MD  cetirizine (ZYRTEC) 10 MG tablet Take 1 tablet (10 mg total) by mouth daily. 07/07/14   Biagio Borg, MD  clindamycin (CLEOCIN T) 1 % lotion Apply topically 2 (two) times daily. 09/04/13   Biagio Borg, MD  dextromethorphan-guaiFENesin [facility] DM) 30-600 MG per 12 hr tablet Take 1 tablet by mouth 2 (two) times daily. 01/31/14   Shanker Kristeen Mans, MD  Eflornithine HCl (VANIQA) 13.9 % cream Apply 1 application topically 2 (two) times daily with a meal.     Historical Provider, MD  fluticasone (FLONASE) 50 MCG/ACT nasal spray Place 2 sprays into both nostrils daily. Patient not taking: Reported on 07/07/2014 01/31/14   Jonetta Osgood, MD  HYDROcodone-acetaminophen (NORCO/VICODIN) 5-325 MG per tablet Take 1 tablet by mouth every 4 (four) hours as needed. 07/31/14   Al Corpus, PA-C  JANUVIA 25 MG tablet TAKE ONE TABLET BY MOUTH ONCE DAILY 06/28/14   Biagio Borg, MD  lidocaine (LIDODERM) 5 % Place 1 patch onto the skin daily. Remove & Discard patch within 12 hours or as directed by MD Patient not taking: Reported on 07/07/2014 04/25/14   Margarita Mail, PA-C  loratadine (CLARITIN) 10 MG tablet Take 1 tablet (10 mg total) by mouth daily. Patient  not taking: Reported on 07/07/2014 01/31/14   Jonetta Osgood, MD  Multiple Vitamin (MULTIVITAMIN WITH MINERALS) TABS tablet Take 1 tablet by mouth daily.    Historical Provider, MD  neomycin-polymyxin-hydrocortisone (CORTISPORIN) otic solution Place 4 drops into the right ear 4 (four) times daily. Patient not taking: Reported on 07/07/2014 10/27/13   Biagio Borg, MD  ondansetron (ZOFRAN) 4 MG tablet Take 1 tablet (4 mg total) by mouth every 6 (six) hours. Patient not taking: Reported on 07/07/2014 07/28/13   Cleatrice Burke, PA-C  OVER THE COUNTER MEDICATION 2 (two) times daily. Benzaderm Wash    Historical Provider, MD  OVER THE COUNTER MEDICATION 1 application 2 (two) times daily. Melaquin Complex    Historical Provider, MD  oxyCODONE-acetaminophen (PERCOCET) 5-325 MG per tablet Take 1-2  tablets by mouth every 4 (four) hours as needed. Patient not taking: Reported on 07/07/2014 04/27/14   Lyndal Pulley, DO  PRESCRIPTION MEDICATION every Monday, Wednesday, and Friday. Dialysis    Historical Provider, MD  sevelamer carbonate (RENVELA) 800 MG tablet Take 1,600 mg by mouth 3 (three) times daily with meals.     Historical Provider, MD  simvastatin (ZOCOR) 20 MG tablet TAKE ONE TABLET BY MOUTH ONCE DAILY 05/11/14   Biagio Borg, MD  sodium chloride (OCEAN) 0.65 % SOLN nasal spray Place 1 spray into both nostrils as needed for congestion. 01/31/14   Shanker Kristeen Mans, MD  tadalafil (CIALIS) 20 MG tablet Take 1 tablet (20 mg total) by mouth daily as needed for erectile dysfunction. 10/27/13   Biagio Borg, MD  TraMADol HCl 300 MG CP24 TAKE ONE CAPSULE BY MOUTH ONCE DAILY 06/29/14   Biagio Borg, MD  triamcinolone (NASACORT AQ) 55 MCG/ACT AERO nasal inhaler Place 2 sprays into the nose daily. 07/07/14   Biagio Borg, MD  valsartan (DIOVAN) 40 MG tablet Take 40 mg by mouth 3 (three) times a week. Takes on Sunday, Tuesday, and Saturday    Historical Provider, MD  warfarin (COUMADIN) 3 MG tablet Take 1-1.5 tablets  (3-4.5 mg total) by mouth See admin instructions. Please hold Coumadin 01/31/14-take 0.5 tablet on 02/01/14-and then resume usual dosing of on Thursday 1.5 tablet and on the remaining days you take 1 tabletTake as directed by anticoagulation clinic. Please make sure you check your INR in the next 2 days 01/31/14   Jonetta Osgood, MD   Triage Vitals: BP 120/98 mmHg  Pulse 87  Temp(Src) 98.9 F (37.2 C) (Oral)  Resp 14  SpO2 94% Physical Exam  Constitutional: He appears well-developed and well-nourished. No distress.  HENT:  Head: Normocephalic and atraumatic.  Eyes: Conjunctivae are normal. Right eye exhibits no discharge. Left eye exhibits no discharge.  Cardiovascular: Normal rate, regular rhythm and normal heart sounds.   Pulmonary/Chest: Effort normal and breath sounds normal. No respiratory distress. He has no wheezes.  Abdominal: Soft. Bowel sounds are normal. He exhibits no distension. There is no tenderness.  Musculoskeletal: He exhibits tenderness.  No midline back tenderness, step off or crepitus. Right sided lower back tenderness with muscle tightness. No CVA tenderness. Anterior medial 1 cm abrasion to left knee without tenderness. Negative anterior and posterior drawer test. No ligamentous laxity. No joint effusion.  Neurological: He is alert. Coordination normal.  Equal muscle tone. 5/5 strength in lower extremities. DTR equal and intact. Negative left straight leg test. Positive right straight leg raise. Antalgic, steady gait with cane.   Skin: Skin is warm and dry. He is not diaphoretic.  Nursing note and vitals reviewed.   ED Course  Procedures (including critical care time) DIAGNOSTIC STUDIES: Oxygen Saturation is 94% on RA, adequate by my interpretation.   COORDINATION OF CARE: 8:40 PM- Will order X-Ray of L-spine and left knee as well as Vicodin for pain. Pt verbalizes understanding and agrees to plan.  Medications  HYDROcodone-acetaminophen (NORCO/VICODIN)  5-325 MG per tablet 1 tablet (1 tablet Oral Given 07/31/14 2045)  HYDROcodone-acetaminophen (NORCO/VICODIN) 5-325 MG per tablet 1 tablet (1 tablet Oral Given 07/31/14 2247)    Labs Review Labs Reviewed - No data to display  Imaging Review Dg Thoracic Spine W/swimmers  07/31/2014   CLINICAL DATA:  Pain following fall  EXAM: THORACIC SPINE - 2 VIEW + SWIMMERS  COMPARISON:  Chest radiograph Jun 18, 2013  FINDINGS: Frontal, lateral, and swimmer's views obtained. There is no fracture or spondylolisthesis. There is mild disc space narrowing at several levels. There are multiple anterior and right lateral osteophytes. No erosive change.  IMPRESSION: Areas of osteoarthritic change.  No fracture or spondylolisthesis.   Electronically Signed   By: Lowella Grip III M.D.   On: 07/31/2014 20:27   Dg Lumbar Spine Complete  07/31/2014   CLINICAL DATA:  59 year old male who fell yesterday. Thoracolumbar pain. Initial encounter.  EXAM: LUMBAR SPINE - COMPLETE 4+ VIEW  COMPARISON:  Thoracic series from today reported separately.  FINDINGS: Normal lumbar segmentation. Compression deformity of the superior endplate of L3 which has an indistinct appearance favoring acute injury. Straightening of lumbar lordosis. Other low lumbar vertebrae appear intact ; chronic or congenital wedging of L1 and L2 suspected.  Superimposed disc space loss and bulky endplate osteophytosis at L4-L5. No pars fracture. Sacral ala and SI joints within normal limits. Right total hip arthroplasty partially visible.  IMPRESSION: 1. Acute mild L3 compression fracture suspected. If specific therapy such as vertebroplasty is desired, Lumbar MRI or Nuclear Medicine Whole-body Bone Scan would best confirm. 2. Lower lumbar disc and endplate degeneration.   Electronically Signed   By: Genevie Ann M.D.   On: 07/31/2014 20:35   Dg Knee Complete 4 Views Left  07/31/2014   CLINICAL DATA:  Trip and fall injury last night. Landed on right side. Generalized left  knee pain and bilateral hip pain. Difficulty bearing weight on the left knee.  EXAM: LEFT KNEE - COMPLETE 4+ VIEW  COMPARISON:  None.  FINDINGS: Prominent tricompartment degenerative changes in the left knee with bone on bone appearance in all 3 compartments, greatest laterally. Associated hypertrophic osteophytes. No evidence of acute fracture or dislocation. No focal bone lesion or bone destruction. No significant effusion. Extensive vascular calcifications.  IMPRESSION: Prominent tricompartment degenerative changes in the left knee. No acute bony abnormalities.   Electronically Signed   By: Lucienne Capers M.D.   On: 07/31/2014 22:21   Dg Hips Bilat With Pelvis 3-4 Views  07/31/2014   CLINICAL DATA:  Bilateral hip pain after slip and fall injury.  EXAM: BILATERAL HIP (WITH PELVIS) 3-4 VIEWS  COMPARISON:  None.  FINDINGS: Views of both hips are obtained.  The right hip demonstrates previous right total hip arthroplasty using non cemented femoral component. Components appear well seated. No evidence of acute fracture or dislocation.  Left hip demonstrates mild degenerative changes with superior acetabular joint space narrowing and sclerosis and osteophytes on both the femoral and acetabular surfaces. No evidence of acute fracture or dislocation.  Visualized pelvis is unremarkable although the entire pelvis is not included. Vascular calcifications.  IMPRESSION: The right total hip arthroplasty. Components appear well seated. Degenerative changes in the left hip. No acute fractures identified.   Electronically Signed   By: Lucienne Capers M.D.   On: 07/31/2014 22:22     EKG Interpretation None      MDM   Final diagnoses:  Fall, initial encounter  Left knee pain  Right-sided low back pain without sciatica  Compression fracture of L3 lumbar vertebra, closed, initial encounter   Pt presenting with mechanical fall with resulting back pain. VSS. Neurovascularly intact. Pt ambulatory. Xray with  evidence of compression fracture of L3. No spine tenderness. I doubt cauda equina. No abdominal tenderness or abdominal symptoms. I doubt abdominal source. Pelvic xray without acute abnormality. Pt with chronic knee pain. No ligamentous laxity and pt denying pain.  Xray without acute abnormalities. Pt pain managed in ED and referral to orthopedics. Norco provided. Driving and sedation precautions provided. Follow up with PCP as scheduled.  Discussed return precautions with patient. Discussed all results and patient verbalizes understanding and agrees with plan.  Case has been discussed with Dr. Darl Householder who agrees with the above plan and to discharge.   I personally performed the services described in this documentation, which was scribed in my presence. The recorded information has been reviewed and is accurate.    Al Corpus, PA-C 08/01/14 0138  Wandra Arthurs, MD 08/02/14 [phone]

## 2014-07-31 NOTE — Discharge Instructions (Signed)
Return to the emergency room with worsening of symptoms, new symptoms or with symptoms that are concerning , especially fevers, loss of control of bladder or bowels, numbness or tingling around genital region or anus, weakness. RICE: Rest, Ice (three cycles of 20 mins on, 80mins off at least twice a day), compression/brace, elevation. Heating pad works well for back pain. Norco for severe pain. Do not operate machinery, drive or drink alcohol while taking narcotics or muscle relaxers. Call to make follow up appointment with orthopedics as soon as possible. Please call your doctor for a followup appointment within 24-48 hours. When you talk to your doctor please let them know that you were seen in the emergency department and have them acquire all of your records so that they can discuss the findings with you and formulate a treatment plan to fully care for your new and ongoing problems.  Read below information and follow recommendations. Back, Compression Fracture A compression fracture happens when a force is put upon the length of your spine. Slipping and falling on your bottom are examples of such a force. When this happens, sometimes the force is great enough to compress the building blocks (vertebral bodies) of your spine. Although this causes a lot of pain, this can usually be treated at home, unless your caregiver feels hospitalization is needed for pain control. Your backbone (spinal column) is made up of 24 main vertebral bodies in addition to the sacrum and coccyx (see illustration). These are held together by tough fibrous tissues (ligaments) and by support of your muscles. Nerve roots pass through the openings between the vertebrae. A sudden wrenching move, injury, or a fall may cause a compression fracture of one of the vertebral bodies. This may result in back pain or spread of pain into the belly (abdomen), the buttocks, and down the leg into the foot. Pain may also be created by muscle spasm  alone. Large studies have been undertaken to determine the best possible course of action to help your back following injury and also to prevent future problems. The recommendations are as follows. FOLLOWING A COMPRESSION FRACTURE: Do the following only if advised by your caregiver.   If a back brace has been suggested or provided, wear it as directed.  Do not stop wearing the back brace unless instructed by your caregiver.  When allowed to return to regular activities, avoid a sedentary lifestyle. Actively exercise. Sporadic weekend binges of tennis, racquetball, or waterskiing may actually aggravate or create problems, especially if you are not in condition for that activity.  Avoid sports requiring sudden body movements until you are in condition for them. Swimming and walking are safer activities.  Maintain good posture.  Avoid obesity.  If not already done, you should have a DEXA scan. Based on the results, be treated for osteoporosis. FOLLOWING ACUTE (SUDDEN) INJURY:  Only take over-the-counter or prescription medicines for pain, discomfort, or fever as directed by your caregiver.  Use bed rest for only the most extreme acute episode. Prolonged bed rest may aggravate your condition. Ice used for acute conditions is effective. Use a large plastic bag filled with ice. Wrap it in a towel. This also provides excellent pain relief. This may be continuous. Or use it for 30 minutes every 2 hours during acute phase, then as needed. Heat for 30 minutes prior to activities is helpful.  As soon as the acute phase (the time when your back is too painful for you to do normal activities) is over, it  is important to resume normal activities and work [employer] information centre manager. Back injuries can cause potentially marked changes in lifestyle. So it is important to attack these problems aggressively.  See your caregiver for continued problems. He or she can help or refer you for appropriate exercises, physical  therapy, and work hardening if needed.  If you are given narcotic medications for your condition, for the next 24 hours do not:  Drive.  Operate machinery or power tools.  Sign legal documents.  Do not drink alcohol, or take sleeping pills or other medications that may interfere with treatment. If your caregiver has given you a follow-up appointment, it is very important to keep that appointment. Not keeping the appointment could result in a chronic or permanent injury, pain, and disability. If there is any problem keeping the appointment, you must call back to this facility for assistance.  SEEK IMMEDIATE MEDICAL CARE IF:  You develop numbness, tingling, weakness, or problems with the use of your arms or legs.  You develop severe back pain not relieved with medications.  You have changes in bowel or bladder control.  You have increasing pain in any areas of the body. Document Released: 01/29/2005 Document Revised: 06/15/2013 Document Reviewed: 09/03/2007 [facility] Patient Information 2015 [place], [place]. This information is not intended to replace advice given to you by your health care provider. Make sure you discuss any questions you have with your health care provider.

## 2014-07-31 NOTE — ED Notes (Signed)
Pt. tripped and fell at home yesterday , denies LOC/ambulatory , reports pain at right lower back and mid back .

## 2014-08-03 ENCOUNTER — Telehealth: Payer: Self-pay | Admitting: Family Medicine

## 2014-08-03 DIAGNOSIS — N2581 Secondary hyperparathyroidism of renal origin: Secondary | ICD-10-CM | POA: Diagnosis not present

## 2014-08-03 DIAGNOSIS — N186 End stage renal disease: Secondary | ICD-10-CM | POA: Diagnosis not present

## 2014-08-03 DIAGNOSIS — D631 Anemia in chronic kidney disease: Secondary | ICD-10-CM | POA: Diagnosis not present

## 2014-08-03 DIAGNOSIS — D509 Iron deficiency anemia, unspecified: Secondary | ICD-10-CM | POA: Diagnosis not present

## 2014-08-04 ENCOUNTER — Encounter: Payer: Self-pay | Admitting: Family Medicine

## 2014-08-04 ENCOUNTER — Ambulatory Visit (INDEPENDENT_AMBULATORY_CARE_PROVIDER_SITE_OTHER): Payer: Medicare Other | Admitting: Family Medicine

## 2014-08-04 VITALS — BP 126/74 | HR 90 | Wt 265.0 lb

## 2014-08-04 DIAGNOSIS — S32030A Wedge compression fracture of third lumbar vertebra, initial encounter for closed fracture: Secondary | ICD-10-CM | POA: Insufficient documentation

## 2014-08-04 DIAGNOSIS — M1712 Unilateral primary osteoarthritis, left knee: Secondary | ICD-10-CM

## 2014-08-04 DIAGNOSIS — I639 Cerebral infarction, unspecified: Secondary | ICD-10-CM | POA: Diagnosis not present

## 2014-08-04 DIAGNOSIS — M1710 Unilateral primary osteoarthritis, unspecified knee: Secondary | ICD-10-CM

## 2014-08-04 DIAGNOSIS — M1711 Unilateral primary osteoarthritis, right knee: Secondary | ICD-10-CM

## 2014-08-04 MED ORDER — VITAMIN D (ERGOCALCIFEROL) 1.25 MG (50000 UNIT) PO CAPS: 50000.0000 [IU] | ORAL_CAPSULE | ORAL | Status: DC

## 2014-08-04 MED ORDER — CALCITONIN (SALMON) 200 UNIT/ACT NA SOLN: 1.0000 | Freq: Every day | NASAL | Status: DC

## 2014-08-04 NOTE — Assessment & Plan Note (Signed)
She is in severe pain at this time. We did not want to mix any other medications and at this time I do think that high-dose vitamin D as well as calcitonin could be beneficial. We discussed the possible side effects. Patient will do this for short course. One month should be enough. We discussed that this will be painful and he will need to start increasing his range of motion during the course of time. Patient knows that if any significant radiculopathy or any weakness of the lower extremity occurs he should call immediately or seek medical attention. Patient will follow-up again in 4 weeks and we'll discuss further treatment.

## 2014-08-04 NOTE — Assessment & Plan Note (Signed)
Patient given bilateral injections again today. We discussed icing regimen and home exercises. We discussed what activities to avoid. I do think that patient's knees are end-stage arthritis and he will likely need a knee replacement. Patient is still waiting decision on this. Discussing with patient as well we talked about with patient's back hernia at this time I do not think he would do really good with rehabilitation we may went to consider allowing him to heal appropriately first. Patient will come back in 4 weeks and if continuing have pain he could be a candidate for viscous supplementation again.

## 2014-08-04 NOTE — Progress Notes (Signed)
Pre visit review using our clinic review tool, if applicable. No additional management support is needed unless otherwise documented below in the visit note. 

## 2014-08-04 NOTE — Patient Instructions (Signed)
Good to see you Ice 20 minutes 2 times daily. Usually after activity and before bed. Calcitonin nose spray daily for 1 month Weekly vitamin D instead of the daily We injected the knees today Keep up with the weight loss.  See me again in 1 month.

## 2014-08-04 NOTE — Progress Notes (Signed)
[facility] [place], [place] [address] Phone: [phone] Subjective:     CC: Bilateral knee pain follow up Since falling back pain  [id number] Daniel Whitney is a 59 y.o. male patient is coming back for his bilateral knee pain.patient did finish this series of Synvisc injections greater than 9 months ago. Patient was given sterilely injection 3 months ago. Patient states that unfortunately the need to shorten given more trouble. Patient unfortunately did fall because the left knee did lock on him. Patient states that when it locked he fell backwards and hit his back very hard. Patient was in the emergency department. Reviewing patient's imaging especially of the lumbar spine patient does have a new L3 compression fracture that seems to be acute. Patient's knee though did show the severe osteophytic changes but no fracture noted. Patient states since the fall he continues to have the discomfort. Patient is mostly not emulating is much secondary to pain in the knees as well as the pain in the back.  Regarding patient's back pain patient states that this is severe any type of movement makes it seemed significantly worse. Patient states that sometimes any have a sharp sensation going down his legs. Patient states that this is intermittent though and denies any bowel or bladder incontinence. Denies any significant weakness of the leg but has not been walking secondary to the pain is much. Patient is taking the pain medications that he has in the [facility] which is only minimally helpful.      Past medical history, social, surgical and family history all reviewed in electronic medical record.   Review of Systems: No headache, visual changes, nausea, vomiting, diarrhea, constipation, dizziness, abdominal pain, skin rash, fevers, chills, night sweats, weight loss, swollen lymph nodes, body aches, joint swelling, muscle aches, chest pain, shortness of breath, mood  changes.   Objective Blood pressure 126/74, pulse 90, weight 265 lb (120.203 kg), SpO2 95 %.  General: No apparent distress alert and oriented x3 mood and affect normal, dressed appropriately. Morbid obesity.  HEENT: Pupils equal, extraocular movements intact patient though does not respond well to light in the right eye Respiratory: Patient's speak in full sentences and does not appear short of breath  Cardiovascular: No lower extremity edema, non tender, no erythema  Skin: Warm dry intact with no signs of infection or rash on extremities or on axial skeleton.  Abdomen: Soft nontender  Neuro: Cranial nerves II through XII are intact, neurovascularly intact in all extremities with 2+ DTRs and 2+ pulses.  Lymph: No lymphadenopathy of posterior or anterior cervical chain or axillae bilaterally.  Gait in a wheelchair today MSK:  Non tender with full range of motion and good stability and symmetric strength and tone of , elbows, wrist,  and ankles bilaterally.   Back Exam:  Inspection: Unremarkable   SLR laying: Negative  XSLR laying: Negative  Palpable tenderness: Severe tenderness over the paraspinal musculature in the spinous process around the L3-L4 area. FABER: negative. Sensory change: Gross sensation intact to all lumbar and sacral dermatomes.  Reflexes: 2+ at both patellar tendons, 2+ at achilles tendons, Babinski's downgoing.  Strength at foot  Plantar-flexion: 5/5 Dorsi-flexion: 5/5 Eversion: 5/5 Inversion: 5/5 and symmetric Leg strength  Quad: 5/5 Hamstring: 5/5 Hip flexor: 5/5 and symmetric         Bilateral knee Exam shows the patient does have crepitus bilaterally and severe tenderness to the medial joint line. Patient does have full range of  motion.  After informed written and verbal consent, patient was seated on exam table. Right knee was prepped with alcohol swab and utilizing anterolateral approach, patient's right knee space was injected with 4:1  marcaine 0.5%:  Kenalog 40mg /dL. Patient tolerated the procedure well without immediate complications.  After informed written and verbal consent, patient was seated on exam table. Left knee was prepped with alcohol swab and utilizing anterolateral approach, patient's left knee space was injected with 4:1  marcaine 0.5%: Kenalog 40mg /dL. Patient tolerated the procedure well without immediate complications.    Impression and Recommendations:     This case required medical decision making of moderate complexity.

## 2014-08-05 DIAGNOSIS — N186 End stage renal disease: Secondary | ICD-10-CM | POA: Diagnosis not present

## 2014-08-05 DIAGNOSIS — D509 Iron deficiency anemia, unspecified: Secondary | ICD-10-CM | POA: Diagnosis not present

## 2014-08-05 DIAGNOSIS — D631 Anemia in chronic kidney disease: Secondary | ICD-10-CM | POA: Diagnosis not present

## 2014-08-05 DIAGNOSIS — I4891 Unspecified atrial fibrillation: Secondary | ICD-10-CM | POA: Diagnosis not present

## 2014-08-05 DIAGNOSIS — N2581 Secondary hyperparathyroidism of renal origin: Secondary | ICD-10-CM | POA: Diagnosis not present

## 2014-08-06 ENCOUNTER — Other Ambulatory Visit: Payer: Self-pay | Admitting: Internal Medicine

## 2014-08-06 ENCOUNTER — Other Ambulatory Visit: Payer: Self-pay | Admitting: Family Medicine

## 2014-08-07 DIAGNOSIS — N186 End stage renal disease: Secondary | ICD-10-CM | POA: Diagnosis not present

## 2014-08-07 DIAGNOSIS — D631 Anemia in chronic kidney disease: Secondary | ICD-10-CM | POA: Diagnosis not present

## 2014-08-07 DIAGNOSIS — N2581 Secondary hyperparathyroidism of renal origin: Secondary | ICD-10-CM | POA: Diagnosis not present

## 2014-08-07 DIAGNOSIS — D509 Iron deficiency anemia, unspecified: Secondary | ICD-10-CM | POA: Diagnosis not present

## 2014-08-10 DIAGNOSIS — N186 End stage renal disease: Secondary | ICD-10-CM | POA: Diagnosis not present

## 2014-08-10 NOTE — Telephone Encounter (Signed)
OK to contact pt  Amiodarone is a medication that is normally refilled per his specialist such as a cardiologist  If this applies to him, please ask him to request refills from prescribing MD, o/w let me know

## 2014-08-11 NOTE — Telephone Encounter (Signed)
error 

## 2014-08-12 DIAGNOSIS — N186 End stage renal disease: Secondary | ICD-10-CM | POA: Diagnosis not present

## 2014-08-12 DIAGNOSIS — I120 Hypertensive chronic kidney disease with stage 5 chronic kidney disease or end stage renal disease: Secondary | ICD-10-CM | POA: Diagnosis not present

## 2014-08-12 DIAGNOSIS — Z992 Dependence on renal dialysis: Secondary | ICD-10-CM | POA: Diagnosis not present

## 2014-08-13 ENCOUNTER — Inpatient Hospital Stay (HOSPITAL_COMMUNITY): Payer: Medicare Other

## 2014-08-13 ENCOUNTER — Encounter (HOSPITAL_COMMUNITY): Payer: Self-pay | Admitting: Emergency Medicine

## 2014-08-13 ENCOUNTER — Inpatient Hospital Stay (HOSPITAL_COMMUNITY)
Admission: EM | Admit: 2014-08-13 | Discharge: 2014-08-31 | DRG: 296 | Disposition: A | Discharge status: Home Health | Payer: Medicare Other | Attending: Internal Medicine | Admitting: Internal Medicine

## 2014-08-13 ENCOUNTER — Emergency Department (HOSPITAL_COMMUNITY): Payer: Medicare Other

## 2014-08-13 DIAGNOSIS — A419 Sepsis, unspecified organism: Secondary | ICD-10-CM | POA: Diagnosis not present

## 2014-08-13 DIAGNOSIS — E11319 Type 2 diabetes mellitus with unspecified diabetic retinopathy without macular edema: Secondary | ICD-10-CM | POA: Diagnosis present

## 2014-08-13 DIAGNOSIS — D638 Anemia in other chronic diseases classified elsewhere: Secondary | ICD-10-CM | POA: Diagnosis present

## 2014-08-13 DIAGNOSIS — M1000 Idiopathic gout, unspecified site: Secondary | ICD-10-CM | POA: Diagnosis present

## 2014-08-13 DIAGNOSIS — Z531 Procedure and treatment not carried out because of patient's decision for reasons of belief and group pressure: Secondary | ICD-10-CM | POA: Diagnosis present

## 2014-08-13 DIAGNOSIS — Z823 Family history of stroke: Secondary | ICD-10-CM | POA: Diagnosis not present

## 2014-08-13 DIAGNOSIS — Z79891 Long term (current) use of opiate analgesic: Secondary | ICD-10-CM | POA: Diagnosis not present

## 2014-08-13 DIAGNOSIS — I509 Heart failure, unspecified: Secondary | ICD-10-CM

## 2014-08-13 DIAGNOSIS — I472 Ventricular tachycardia: Secondary | ICD-10-CM | POA: Diagnosis present

## 2014-08-13 DIAGNOSIS — E785 Hyperlipidemia, unspecified: Secondary | ICD-10-CM | POA: Diagnosis present

## 2014-08-13 DIAGNOSIS — Z6833 Body mass index (BMI) 33.0-33.9, adult: Secondary | ICD-10-CM

## 2014-08-13 DIAGNOSIS — I252 Old myocardial infarction: Secondary | ICD-10-CM

## 2014-08-13 DIAGNOSIS — R1310 Dysphagia, unspecified: Secondary | ICD-10-CM | POA: Diagnosis not present

## 2014-08-13 DIAGNOSIS — E1121 Type 2 diabetes mellitus with diabetic nephropathy: Secondary | ICD-10-CM | POA: Diagnosis present

## 2014-08-13 DIAGNOSIS — I429 Cardiomyopathy, unspecified: Secondary | ICD-10-CM | POA: Diagnosis present

## 2014-08-13 DIAGNOSIS — D631 Anemia in chronic kidney disease: Secondary | ICD-10-CM | POA: Diagnosis not present

## 2014-08-13 DIAGNOSIS — B9620 Unspecified Escherichia coli [E. coli] as the cause of diseases classified elsewhere: Secondary | ICD-10-CM

## 2014-08-13 DIAGNOSIS — I5043 Acute on chronic combined systolic (congestive) and diastolic (congestive) heart failure: Secondary | ICD-10-CM | POA: Diagnosis present

## 2014-08-13 DIAGNOSIS — I272 Other secondary pulmonary hypertension: Secondary | ICD-10-CM | POA: Diagnosis present

## 2014-08-13 DIAGNOSIS — R197 Diarrhea, unspecified: Secondary | ICD-10-CM | POA: Diagnosis not present

## 2014-08-13 DIAGNOSIS — I69351 Hemiplegia and hemiparesis following cerebral infarction affecting right dominant side: Secondary | ICD-10-CM | POA: Diagnosis not present

## 2014-08-13 DIAGNOSIS — Z7901 Long term (current) use of anticoagulants: Secondary | ICD-10-CM | POA: Diagnosis not present

## 2014-08-13 DIAGNOSIS — G9341 Metabolic encephalopathy: Secondary | ICD-10-CM | POA: Diagnosis present

## 2014-08-13 DIAGNOSIS — E1140 Type 2 diabetes mellitus with diabetic neuropathy, unspecified: Secondary | ICD-10-CM | POA: Diagnosis present

## 2014-08-13 DIAGNOSIS — Z0189 Encounter for other specified special examinations: Secondary | ICD-10-CM

## 2014-08-13 DIAGNOSIS — J811 Chronic pulmonary edema: Secondary | ICD-10-CM | POA: Diagnosis not present

## 2014-08-13 DIAGNOSIS — I4891 Unspecified atrial fibrillation: Secondary | ICD-10-CM | POA: Diagnosis not present

## 2014-08-13 DIAGNOSIS — Z96641 Presence of right artificial hip joint: Secondary | ICD-10-CM | POA: Diagnosis present

## 2014-08-13 DIAGNOSIS — E871 Hypo-osmolality and hyponatremia: Secondary | ICD-10-CM | POA: Diagnosis present

## 2014-08-13 DIAGNOSIS — Z09 Encounter for follow-up examination after completed treatment for conditions other than malignant neoplasm: Secondary | ICD-10-CM

## 2014-08-13 DIAGNOSIS — Z79899 Other long term (current) drug therapy: Secondary | ICD-10-CM

## 2014-08-13 DIAGNOSIS — R0602 Shortness of breath: Secondary | ICD-10-CM | POA: Diagnosis not present

## 2014-08-13 DIAGNOSIS — G4733 Obstructive sleep apnea (adult) (pediatric): Secondary | ICD-10-CM | POA: Diagnosis present

## 2014-08-13 DIAGNOSIS — I517 Cardiomegaly: Secondary | ICD-10-CM | POA: Diagnosis not present

## 2014-08-13 DIAGNOSIS — R2990 Unspecified symptoms and signs involving the nervous system: Secondary | ICD-10-CM

## 2014-08-13 DIAGNOSIS — I5020 Unspecified systolic (congestive) heart failure: Secondary | ICD-10-CM | POA: Diagnosis not present

## 2014-08-13 DIAGNOSIS — J9601 Acute respiratory failure with hypoxia: Secondary | ICD-10-CM | POA: Diagnosis present

## 2014-08-13 DIAGNOSIS — H409 Unspecified glaucoma: Secondary | ICD-10-CM | POA: Diagnosis present

## 2014-08-13 DIAGNOSIS — R791 Abnormal coagulation profile: Secondary | ICD-10-CM | POA: Diagnosis present

## 2014-08-13 DIAGNOSIS — J984 Other disorders of lung: Secondary | ICD-10-CM | POA: Diagnosis not present

## 2014-08-13 DIAGNOSIS — E875 Hyperkalemia: Secondary | ICD-10-CM | POA: Diagnosis present

## 2014-08-13 DIAGNOSIS — J209 Acute bronchitis, unspecified: Secondary | ICD-10-CM | POA: Diagnosis not present

## 2014-08-13 DIAGNOSIS — I469 Cardiac arrest, cause unspecified: Principal | ICD-10-CM | POA: Diagnosis present

## 2014-08-13 DIAGNOSIS — Z01818 Encounter for other preprocedural examination: Secondary | ICD-10-CM

## 2014-08-13 DIAGNOSIS — G8929 Other chronic pain: Secondary | ICD-10-CM | POA: Diagnosis present

## 2014-08-13 DIAGNOSIS — G9340 Encephalopathy, unspecified: Secondary | ICD-10-CM | POA: Diagnosis not present

## 2014-08-13 DIAGNOSIS — I120 Hypertensive chronic kidney disease with stage 5 chronic kidney disease or end stage renal disease: Secondary | ICD-10-CM | POA: Diagnosis present

## 2014-08-13 DIAGNOSIS — T45515A Adverse effect of anticoagulants, initial encounter: Secondary | ICD-10-CM | POA: Diagnosis not present

## 2014-08-13 DIAGNOSIS — J9690 Respiratory failure, unspecified, unspecified whether with hypoxia or hypercapnia: Secondary | ICD-10-CM | POA: Insufficient documentation

## 2014-08-13 DIAGNOSIS — R0902 Hypoxemia: Secondary | ICD-10-CM

## 2014-08-13 DIAGNOSIS — D699 Hemorrhagic condition, unspecified: Secondary | ICD-10-CM

## 2014-08-13 DIAGNOSIS — G931 Anoxic brain damage, not elsewhere classified: Secondary | ICD-10-CM | POA: Diagnosis present

## 2014-08-13 DIAGNOSIS — D72829 Elevated white blood cell count, unspecified: Secondary | ICD-10-CM | POA: Insufficient documentation

## 2014-08-13 DIAGNOSIS — N186 End stage renal disease: Secondary | ICD-10-CM | POA: Diagnosis not present

## 2014-08-13 DIAGNOSIS — Z978 Presence of other specified devices: Secondary | ICD-10-CM

## 2014-08-13 DIAGNOSIS — R918 Other nonspecific abnormal finding of lung field: Secondary | ICD-10-CM | POA: Diagnosis not present

## 2014-08-13 DIAGNOSIS — E872 Acidosis: Secondary | ICD-10-CM | POA: Diagnosis present

## 2014-08-13 DIAGNOSIS — R079 Chest pain, unspecified: Secondary | ICD-10-CM | POA: Diagnosis not present

## 2014-08-13 DIAGNOSIS — M545 Low back pain: Secondary | ICD-10-CM | POA: Diagnosis present

## 2014-08-13 DIAGNOSIS — Z992 Dependence on renal dialysis: Secondary | ICD-10-CM

## 2014-08-13 DIAGNOSIS — J80 Acute respiratory distress syndrome: Secondary | ICD-10-CM | POA: Insufficient documentation

## 2014-08-13 DIAGNOSIS — Z452 Encounter for adjustment and management of vascular access device: Secondary | ICD-10-CM | POA: Diagnosis not present

## 2014-08-13 DIAGNOSIS — J309 Allergic rhinitis, unspecified: Secondary | ICD-10-CM | POA: Diagnosis present

## 2014-08-13 DIAGNOSIS — I2510 Atherosclerotic heart disease of native coronary artery without angina pectoris: Secondary | ICD-10-CM | POA: Diagnosis present

## 2014-08-13 DIAGNOSIS — Z8249 Family history of ischemic heart disease and other diseases of the circulatory system: Secondary | ICD-10-CM

## 2014-08-13 DIAGNOSIS — J40 Bronchitis, not specified as acute or chronic: Secondary | ICD-10-CM | POA: Diagnosis not present

## 2014-08-13 DIAGNOSIS — J9811 Atelectasis: Secondary | ICD-10-CM | POA: Diagnosis not present

## 2014-08-13 DIAGNOSIS — R6521 Severe sepsis with septic shock: Secondary | ICD-10-CM | POA: Diagnosis not present

## 2014-08-13 DIAGNOSIS — Z9115 Patient's noncompliance with renal dialysis: Secondary | ICD-10-CM | POA: Diagnosis present

## 2014-08-13 DIAGNOSIS — G9389 Other specified disorders of brain: Secondary | ICD-10-CM | POA: Diagnosis not present

## 2014-08-13 DIAGNOSIS — Z4682 Encounter for fitting and adjustment of non-vascular catheter: Secondary | ICD-10-CM | POA: Diagnosis not present

## 2014-08-13 DIAGNOSIS — K219 Gastro-esophageal reflux disease without esophagitis: Secondary | ICD-10-CM | POA: Diagnosis present

## 2014-08-13 DIAGNOSIS — Z4659 Encounter for fitting and adjustment of other gastrointestinal appliance and device: Secondary | ICD-10-CM

## 2014-08-13 DIAGNOSIS — R29898 Other symptoms and signs involving the musculoskeletal system: Secondary | ICD-10-CM | POA: Diagnosis not present

## 2014-08-13 LAB — CBC WITH DIFFERENTIAL/PLATELET
BASOS ABS: 0 10*3/uL (ref 0.0–0.1)
BASOS PCT: 0 % (ref 0–1)
Eosinophils Absolute: 0 10*3/uL (ref 0.0–0.7)
Eosinophils Relative: 0 % (ref 0–5)
HCT: 34.3 % — ABNORMAL LOW (ref 39.0–52.0)
Hemoglobin: 11 g/dL — ABNORMAL LOW (ref 13.0–17.0)
LYMPHS ABS: 0.6 10*3/uL — AB (ref 0.7–4.0)
Lymphocytes Relative: 6 % — ABNORMAL LOW (ref 12–46)
MCH: 29.8 pg (ref 26.0–34.0)
MCHC: 32.1 g/dL (ref 30.0–36.0)
MCV: 93 fL (ref 78.0–100.0)
MONO ABS: 0.4 10*3/uL (ref 0.1–1.0)
MONOS PCT: 4 % (ref 3–12)
Neutro Abs: 8.1 10*3/uL — ABNORMAL HIGH (ref 1.7–7.7)
Neutrophils Relative %: 90 % — ABNORMAL HIGH (ref 43–77)
PLATELETS: 212 10*3/uL (ref 150–400)
RBC: 3.69 MIL/uL — AB (ref 4.22–5.81)
RDW: 15.4 % (ref 11.5–15.5)
WBC: 9.2 10*3/uL (ref 4.0–10.5)

## 2014-08-13 LAB — I-STAT ARTERIAL BLOOD GAS, ED
ACID-BASE DEFICIT: 5 mmol/L — AB (ref 0.0–2.0)
ACID-BASE DEFICIT: 4 mmol/L — AB (ref 0.0–2.0)
BICARBONATE: 26.1 meq/L — AB (ref 20.0–24.0)
Bicarbonate: 20.3 mEq/L (ref 20.0–24.0)
O2 SAT: 99 %
O2 SAT: 95 %
PCO2 ART: 78.9 mmHg — AB (ref 35.0–45.0)
PCO2 ART: 31.9 mmHg — AB (ref 35.0–45.0)
PH ART: 7.128 — AB (ref 7.350–7.450)
Patient temperature: 98.7
Patient temperature: 34.7
TCO2: 28 mmol/L (ref 0–100)
TCO2: 21 mmol/L (ref 0–100)
pH, Arterial: 7.401 (ref 7.350–7.450)
pO2, Arterial: 218 mmHg — ABNORMAL HIGH (ref 80.0–100.0)
pO2, Arterial: 68 mmHg — ABNORMAL LOW (ref 80.0–100.0)

## 2014-08-13 LAB — I-STAT CHEM 8, ED
BUN: 106 mg/dL — ABNORMAL HIGH (ref 6–20)
BUN: 98 mg/dL — ABNORMAL HIGH (ref 6–20)
CALCIUM ION: 1.17 mmol/L (ref 1.12–1.23)
CALCIUM ION: 1.07 mmol/L — AB (ref 1.12–1.23)
Chloride: 95 mmol/L — ABNORMAL LOW (ref 101–111)
Chloride: 97 mmol/L — ABNORMAL LOW (ref 101–111)
Creatinine, Ser: 10.7 mg/dL — ABNORMAL HIGH (ref 0.61–1.24)
Creatinine, Ser: 10.5 mg/dL — ABNORMAL HIGH (ref 0.61–1.24)
GLUCOSE: 149 mg/dL — AB (ref 65–99)
GLUCOSE: 197 mg/dL — AB (ref 65–99)
HCT: 38 % — ABNORMAL LOW (ref 39.0–52.0)
HCT: 44 % (ref 39.0–52.0)
Hemoglobin: 12.9 g/dL — ABNORMAL LOW (ref 13.0–17.0)
Hemoglobin: 15 g/dL (ref 13.0–17.0)
POTASSIUM: 5.2 mmol/L — AB (ref 3.5–5.1)
Potassium: 5.2 mmol/L — ABNORMAL HIGH (ref 3.5–5.1)
SODIUM: 128 mmol/L — AB (ref 135–145)
Sodium: 131 mmol/L — ABNORMAL LOW (ref 135–145)
TCO2: 24 mmol/L (ref 0–100)
TCO2: 18 mmol/L (ref 0–100)

## 2014-08-13 LAB — URINALYSIS, ROUTINE W REFLEX MICROSCOPIC
BILIRUBIN URINE: NEGATIVE
Glucose, UA: NEGATIVE mg/dL
Ketones, ur: NEGATIVE mg/dL
Nitrite: NEGATIVE
PROTEIN: 100 mg/dL — AB
Specific Gravity, Urine: 1.016 (ref 1.005–1.030)
Urobilinogen, UA: 0.2 mg/dL (ref 0.0–1.0)
pH: 5 (ref 5.0–8.0)

## 2014-08-13 LAB — I-STAT CG4 LACTIC ACID, ED: Lactic Acid, Venous: 2.93 mmol/L (ref 0.5–2.0)

## 2014-08-13 LAB — TRIGLYCERIDES: Triglycerides: 231 mg/dL — ABNORMAL HIGH (ref <150)

## 2014-08-13 LAB — BASIC METABOLIC PANEL
ANION GAP: 14 (ref 5–15)
ANION GAP: 13 (ref 5–15)
Anion gap: 25 — ABNORMAL HIGH (ref 5–15)
Anion gap: 21 — ABNORMAL HIGH (ref 5–15)
Anion gap: 13 (ref 5–15)
Anion gap: 15 (ref 5–15)
BUN: 122 mg/dL — AB (ref 6–20)
BUN: 117 mg/dL — ABNORMAL HIGH (ref 6–20)
BUN: 63 mg/dL — ABNORMAL HIGH (ref 6–20)
BUN: 52 mg/dL — AB (ref 6–20)
BUN: 68 mg/dL — ABNORMAL HIGH (ref 6–20)
BUN: 74 mg/dL — ABNORMAL HIGH (ref 6–20)
CALCIUM: 9.6 mg/dL (ref 8.9–10.3)
CHLORIDE: 95 mmol/L — AB (ref 101–111)
CO2: 15 mmol/L — ABNORMAL LOW (ref 22–32)
CO2: 18 mmol/L — ABNORMAL LOW (ref 22–32)
CO2: 24 mmol/L (ref 22–32)
CO2: 27 mmol/L (ref 22–32)
CO2: 26 mmol/L (ref 22–32)
CO2: 25 mmol/L (ref 22–32)
CREATININE: 10.46 mg/dL — AB (ref 0.61–1.24)
CREATININE: 6.4 mg/dL — AB (ref 0.61–1.24)
CREATININE: 7.07 mg/dL — AB (ref 0.61–1.24)
Calcium: 10.2 mg/dL (ref 8.9–10.3)
Calcium: 9.8 mg/dL (ref 8.9–10.3)
Calcium: 9.1 mg/dL (ref 8.9–10.3)
Calcium: 9.2 mg/dL (ref 8.9–10.3)
Calcium: 9.2 mg/dL (ref 8.9–10.3)
Chloride: 90 mmol/L — ABNORMAL LOW (ref 101–111)
Chloride: 92 mmol/L — ABNORMAL LOW (ref 101–111)
Chloride: 95 mmol/L — ABNORMAL LOW (ref 101–111)
Chloride: 95 mmol/L — ABNORMAL LOW (ref 101–111)
Chloride: 95 mmol/L — ABNORMAL LOW (ref 101–111)
Creatinine, Ser: 10.13 mg/dL — ABNORMAL HIGH (ref 0.61–1.24)
Creatinine, Ser: 5.26 mg/dL — ABNORMAL HIGH (ref 0.61–1.24)
Creatinine, Ser: 7.68 mg/dL — ABNORMAL HIGH (ref 0.61–1.24)
GFR calc Af Amer: 6 mL/min — ABNORMAL LOW (ref >60)
GFR calc Af Amer: 13 mL/min — ABNORMAL LOW (ref >60)
GFR calc Af Amer: 9 mL/min — ABNORMAL LOW (ref >60)
GFR calc Af Amer: 8 mL/min — ABNORMAL LOW (ref >60)
GFR calc non Af Amer: 5 mL/min — ABNORMAL LOW (ref >60)
GFR calc non Af Amer: 7 mL/min — ABNORMAL LOW (ref >60)
GFR, EST AFRICAN AMERICAN: 5 mL/min — AB (ref >60)
GFR, EST AFRICAN AMERICAN: 10 mL/min — AB (ref >60)
GFR, EST NON AFRICAN AMERICAN: 5 mL/min — AB (ref >60)
GFR, EST NON AFRICAN AMERICAN: 9 mL/min — AB (ref >60)
GFR, EST NON AFRICAN AMERICAN: 11 mL/min — AB (ref >60)
GFR, EST NON AFRICAN AMERICAN: 8 mL/min — AB (ref >60)
GLUCOSE: 143 mg/dL — AB (ref 65–99)
Glucose, Bld: 181 mg/dL — ABNORMAL HIGH (ref 65–99)
Glucose, Bld: 188 mg/dL — ABNORMAL HIGH (ref 65–99)
Glucose, Bld: 145 mg/dL — ABNORMAL HIGH (ref 65–99)
Glucose, Bld: 129 mg/dL — ABNORMAL HIGH (ref 65–99)
Glucose, Bld: 156 mg/dL — ABNORMAL HIGH (ref 65–99)
POTASSIUM: 5.4 mmol/L — AB (ref 3.5–5.1)
POTASSIUM: 3.5 mmol/L (ref 3.5–5.1)
POTASSIUM: 3.8 mmol/L (ref 3.5–5.1)
Potassium: 4.2 mmol/L (ref 3.5–5.1)
Potassium: 3.3 mmol/L — ABNORMAL LOW (ref 3.5–5.1)
Potassium: 3.9 mmol/L (ref 3.5–5.1)
Sodium: 130 mmol/L — ABNORMAL LOW (ref 135–145)
Sodium: 131 mmol/L — ABNORMAL LOW (ref 135–145)
Sodium: 133 mmol/L — ABNORMAL LOW (ref 135–145)
Sodium: 135 mmol/L (ref 135–145)
Sodium: 134 mmol/L — ABNORMAL LOW (ref 135–145)
Sodium: 135 mmol/L (ref 135–145)

## 2014-08-13 LAB — GLUCOSE, CAPILLARY
GLUCOSE-CAPILLARY: 145 mg/dL — AB (ref 65–99)
GLUCOSE-CAPILLARY: 150 mg/dL — AB (ref 65–99)
GLUCOSE-CAPILLARY: 148 mg/dL — AB (ref 65–99)
Glucose-Capillary: 153 mg/dL — ABNORMAL HIGH (ref 65–99)
Glucose-Capillary: 187 mg/dL — ABNORMAL HIGH (ref 65–99)
Glucose-Capillary: 185 mg/dL — ABNORMAL HIGH (ref 65–99)
Glucose-Capillary: 156 mg/dL — ABNORMAL HIGH (ref 65–99)
Glucose-Capillary: 131 mg/dL — ABNORMAL HIGH (ref 65–99)
Glucose-Capillary: 124 mg/dL — ABNORMAL HIGH (ref 65–99)
Glucose-Capillary: 133 mg/dL — ABNORMAL HIGH (ref 65–99)
Glucose-Capillary: 141 mg/dL — ABNORMAL HIGH (ref 65–99)

## 2014-08-13 LAB — POCT I-STAT 3, ART BLOOD GAS (G3+)
Acid-base deficit: 3 mmol/L — ABNORMAL HIGH (ref 0.0–2.0)
BICARBONATE: 24.1 meq/L — AB (ref 20.0–24.0)
O2 SAT: 30 %
PCO2 ART: 42.1 mmHg (ref 35.0–45.0)
PH ART: 7.348 — AB (ref 7.350–7.450)
PO2 ART: 17 mmHg — AB (ref 80.0–100.0)
Patient temperature: 33.6
TCO2: 26 mmol/L (ref 0–100)

## 2014-08-13 LAB — COMPREHENSIVE METABOLIC PANEL
ALBUMIN: 3.3 g/dL — AB (ref 3.5–5.0)
ALT: 17 U/L (ref 17–63)
AST: 13 U/L — ABNORMAL LOW (ref 15–41)
Alkaline Phosphatase: 164 U/L — ABNORMAL HIGH (ref 38–126)
Anion gap: 22 — ABNORMAL HIGH (ref 5–15)
BUN: 114 mg/dL — AB (ref 6–20)
CHLORIDE: 90 mmol/L — AB (ref 101–111)
CO2: 20 mmol/L — ABNORMAL LOW (ref 22–32)
CREATININE: 10.48 mg/dL — AB (ref 0.61–1.24)
Calcium: 10 mg/dL (ref 8.9–10.3)
GFR, EST AFRICAN AMERICAN: 5 mL/min — AB (ref >60)
GFR, EST NON AFRICAN AMERICAN: 5 mL/min — AB (ref >60)
GLUCOSE: 142 mg/dL — AB (ref 65–99)
POTASSIUM: 5.3 mmol/L — AB (ref 3.5–5.1)
Sodium: 132 mmol/L — ABNORMAL LOW (ref 135–145)
Total Bilirubin: 0.6 mg/dL (ref 0.3–1.2)
Total Protein: 7.1 g/dL (ref 6.5–8.1)

## 2014-08-13 LAB — POCT I-STAT, CHEM 8
BUN: 116 mg/dL — ABNORMAL HIGH (ref 6–20)
CHLORIDE: 100 mmol/L — AB (ref 101–111)
Calcium, Ion: 1.13 mmol/L (ref 1.12–1.23)
Creatinine, Ser: 8.8 mg/dL — ABNORMAL HIGH (ref 0.61–1.24)
Glucose, Bld: 167 mg/dL — ABNORMAL HIGH (ref 65–99)
HCT: 36 % — ABNORMAL LOW (ref 39.0–52.0)
HEMOGLOBIN: 12.2 g/dL — AB (ref 13.0–17.0)
POTASSIUM: 6.4 mmol/L — AB (ref 3.5–5.1)
Sodium: 133 mmol/L — ABNORMAL LOW (ref 135–145)
TCO2: 20 mmol/L (ref 0–100)

## 2014-08-13 LAB — TROPONIN I
TROPONIN I: 0.12 ng/mL — AB (ref <0.031)
TROPONIN I: 0.19 ng/mL — AB (ref <0.031)
Troponin I: 0.17 ng/mL — ABNORMAL HIGH (ref <0.031)
Troponin I: 0.21 ng/mL — ABNORMAL HIGH (ref <0.031)

## 2014-08-13 LAB — APTT
APTT: 46 s — AB (ref 24–37)
aPTT: 50 seconds — ABNORMAL HIGH (ref 24–37)

## 2014-08-13 LAB — PROTIME-INR
INR: 6.82 (ref 0.00–1.49)
INR: 3.47 — ABNORMAL HIGH (ref 0.00–1.49)
INR: 2.92 — ABNORMAL HIGH (ref 0.00–1.49)
PROTHROMBIN TIME: 56.8 s — AB (ref 11.6–15.2)
PROTHROMBIN TIME: 34.2 s — AB (ref 11.6–15.2)
Prothrombin Time: 30 seconds — ABNORMAL HIGH (ref 11.6–15.2)

## 2014-08-13 LAB — PHOSPHORUS: PHOSPHORUS: 10.3 mg/dL — AB (ref 2.5–4.6)

## 2014-08-13 LAB — URINE MICROSCOPIC-ADD ON

## 2014-08-13 LAB — CORTISOL: Cortisol, Plasma: 27.5 ug/dL

## 2014-08-13 LAB — MAGNESIUM: MAGNESIUM: 2.4 mg/dL (ref 1.7–2.4)

## 2014-08-13 LAB — I-STAT TROPONIN, ED: Troponin i, poc: 0 ng/mL (ref 0.00–0.08)

## 2014-08-13 LAB — MRSA PCR SCREENING: MRSA by PCR: NEGATIVE

## 2014-08-13 LAB — TSH: TSH: 0.065 u[IU]/mL — ABNORMAL LOW (ref 0.350–4.500)

## 2014-08-13 MED ORDER — ARTIFICIAL TEARS OP OINT
1.0000 "application " | TOPICAL_OINTMENT | Freq: Three times a day (TID) | OPHTHALMIC | Status: DC
  Administered 2014-08-13 – 2014-08-14 (×5): 1 via OPHTHALMIC
  Filled 2014-08-13 (×2): qty 3.5

## 2014-08-13 MED ORDER — DEXTROSE 5 % IV SOLN
0.0000 ug/min | INTRAVENOUS | Status: DC
  Filled 2014-08-13: qty 4

## 2014-08-13 MED ORDER — FENTANYL CITRATE (PF) 100 MCG/2ML IJ SOLN: 100.0000 ug | INTRAMUSCULAR | Status: DC | PRN

## 2014-08-13 MED ORDER — LIDOCAINE HCL (PF) 1 % IJ SOLN: 5.0000 mL | INTRAMUSCULAR | Status: DC | PRN

## 2014-08-13 MED ORDER — NEPRO/CARBSTEADY PO LIQD
237.0000 mL | ORAL | Status: DC | PRN
  Filled 2014-08-13: qty 237

## 2014-08-13 MED ORDER — ETOMIDATE 2 MG/ML IV SOLN
INTRAVENOUS | Status: AC
  Filled 2014-08-13: qty 20

## 2014-08-13 MED ORDER — ROCURONIUM BROMIDE 50 MG/5ML IV SOLN
100.0000 mg | Freq: Once | INTRAVENOUS | Status: AC
  Administered 2014-08-13: 100 mg via INTRAVENOUS
  Filled 2014-08-13: qty 10

## 2014-08-13 MED ORDER — LIDOCAINE-PRILOCAINE 2.5-2.5 % EX CREA: 1.0000 "application " | TOPICAL_CREAM | CUTANEOUS | Status: DC | PRN

## 2014-08-13 MED ORDER — CISATRACURIUM BESYLATE (PF) 200 MG/20ML IV SOLN
1.0000 ug/kg/min | INTRAVENOUS | Status: DC
  Administered 2014-08-13 – 2014-08-14 (×2): 1 ug/kg/min via INTRAVENOUS
  Filled 2014-08-13 (×2): qty 20

## 2014-08-13 MED ORDER — CISATRACURIUM BOLUS VIA INFUSION
0.1000 mg/kg | Freq: Once | INTRAVENOUS | Status: AC
  Administered 2014-08-13: 13.6 mg via INTRAVENOUS
  Filled 2014-08-13: qty 14

## 2014-08-13 MED ORDER — FENTANYL CITRATE (PF) 100 MCG/2ML IJ SOLN
INTRAMUSCULAR | Status: AC
  Filled 2014-08-13: qty 2

## 2014-08-13 MED ORDER — PENTAFLUOROPROP-TETRAFLUOROETH EX AERO: 1.0000 "application " | INHALATION_SPRAY | CUTANEOUS | Status: DC | PRN

## 2014-08-13 MED ORDER — PROPOFOL 1000 MG/100ML IV EMUL
INTRAVENOUS | Status: AC
  Filled 2014-08-13: qty 100

## 2014-08-13 MED ORDER — PROPOFOL 1000 MG/100ML IV EMUL
0.0000 ug/kg/min | INTRAVENOUS | Status: DC
Start: 2014-08-13 — End: 2014-08-13
  Administered 2014-08-13: 20 ug/kg/min via INTRAVENOUS

## 2014-08-13 MED ORDER — PROPOFOL 1000 MG/100ML IV EMUL
0.0000 ug/kg/min | INTRAVENOUS | Status: DC
  Administered 2014-08-13: 20 ug/kg/min via INTRAVENOUS
  Administered 2014-08-13: 10 ug/kg/min via INTRAVENOUS
  Administered 2014-08-13 – 2014-08-14 (×4): 20 ug/kg/min via INTRAVENOUS
  Administered 2014-08-15: 10 ug/kg/min via INTRAVENOUS
  Administered 2014-08-15: 20 ug/kg/min via INTRAVENOUS
  Administered 2014-08-15 – 2014-08-16 (×2): 10 ug/kg/min via INTRAVENOUS
  Filled 2014-08-13 (×11): qty 100

## 2014-08-13 MED ORDER — FENTANYL CITRATE (PF) 100 MCG/2ML IJ SOLN
100.0000 ug | INTRAMUSCULAR | Status: DC | PRN
  Administered 2014-08-13: 100 ug via INTRAVENOUS

## 2014-08-13 MED ORDER — MIDAZOLAM HCL 2 MG/2ML IJ SOLN
INTRAMUSCULAR | Status: AC
  Filled 2014-08-13: qty 2

## 2014-08-13 MED ORDER — CALCIUM CHLORIDE 10 % IV SOLN
1.0000 g | Freq: Once | INTRAVENOUS | Status: AC
  Administered 2014-08-13: 1 g via INTRAVENOUS

## 2014-08-13 MED ORDER — SODIUM CHLORIDE 0.9 % IV SOLN: 100.0000 mL | INTRAVENOUS | Status: DC | PRN

## 2014-08-13 MED ORDER — HEPARIN SODIUM (PORCINE) 1000 UNIT/ML DIALYSIS: 1000.0000 [IU] | INTRAMUSCULAR | Status: DC | PRN

## 2014-08-13 MED ORDER — SODIUM CHLORIDE 0.9 % IV SOLN
1.0000 ug/kg/min | INTRAVENOUS | Status: DC
  Administered 2014-08-13: 1 ug/kg/min via INTRAVENOUS
  Filled 2014-08-13: qty 20

## 2014-08-13 MED ORDER — SUCCINYLCHOLINE CHLORIDE 20 MG/ML IJ SOLN
INTRAMUSCULAR | Status: AC
  Filled 2014-08-13: qty 1

## 2014-08-13 MED ORDER — SODIUM CHLORIDE 0.9 % IV SOLN
25.0000 ug/h | INTRAVENOUS | Status: DC
  Administered 2014-08-13: 50 ug/h via INTRAVENOUS
  Administered 2014-08-14: 150 ug/h via INTRAVENOUS
  Administered 2014-08-14: 100 ug/h via INTRAVENOUS
  Administered 2014-08-15 – 2014-08-16 (×3): 150 ug/h via INTRAVENOUS
  Administered 2014-08-18: 200 ug/h via INTRAVENOUS
  Filled 2014-08-13 (×7): qty 50

## 2014-08-13 MED ORDER — PANTOPRAZOLE SODIUM 40 MG IV SOLR
40.0000 mg | Freq: Every day | INTRAVENOUS | Status: DC
  Administered 2014-08-13 – 2014-08-16 (×4): 40 mg via INTRAVENOUS
  Filled 2014-08-13 (×6): qty 40

## 2014-08-13 MED ORDER — ASPIRIN 300 MG RE SUPP
300.0000 mg | RECTAL | Status: AC
  Administered 2014-08-13: 300 mg via RECTAL
  Filled 2014-08-13: qty 1

## 2014-08-13 MED ORDER — PIPERACILLIN-TAZOBACTAM 3.375 G IVPB 30 MIN
3.3750 g | Freq: Once | INTRAVENOUS | Status: AC
  Administered 2014-08-13: 3.375 g via INTRAVENOUS
  Filled 2014-08-13: qty 50

## 2014-08-13 MED ORDER — ROCURONIUM BROMIDE 50 MG/5ML IV SOLN
INTRAVENOUS | Status: AC
  Filled 2014-08-13: qty 2

## 2014-08-13 MED ORDER — KETAMINE HCL 10 MG/ML IJ SOLN
150.0000 mg | Freq: Once | INTRAMUSCULAR | Status: AC
  Administered 2014-08-13: 150 mg via INTRAVENOUS
  Filled 2014-08-13: qty 15

## 2014-08-13 MED ORDER — VITAMIN K1 10 MG/ML IJ SOLN
5.0000 mg | Freq: Once | INTRAVENOUS | Status: AC
  Administered 2014-08-13: 5 mg via INTRAVENOUS
  Filled 2014-08-13: qty 0.5

## 2014-08-13 MED ORDER — MIDAZOLAM HCL 2 MG/2ML IJ SOLN: 4.0000 mg | INTRAMUSCULAR | Status: DC | PRN

## 2014-08-13 MED ORDER — SODIUM CHLORIDE 0.9 % IV SOLN
1.0000 g | Freq: Once | INTRAVENOUS | Status: DC
  Filled 2014-08-13: qty 10

## 2014-08-13 MED ORDER — NOREPINEPHRINE BITARTRATE 1 MG/ML IV SOLN
0.0000 ug/min | INTRAVENOUS | Status: DC
  Administered 2014-08-13: 5 ug/min via INTRAVENOUS
  Administered 2014-08-14: 10.027 ug/min via INTRAVENOUS
  Administered 2014-08-15 – 2014-08-17 (×3): 5 ug/min via INTRAVENOUS
  Administered 2014-08-18: 0 ug/min via INTRAVENOUS
  Filled 2014-08-13 (×5): qty 16

## 2014-08-13 MED ORDER — CISATRACURIUM BOLUS VIA INFUSION
0.0500 mg/kg | INTRAVENOUS | Status: DC | PRN
  Filled 2014-08-13: qty 6

## 2014-08-13 MED ORDER — CHLORHEXIDINE GLUCONATE 0.12 % MT SOLN
15.0000 mL | Freq: Two times a day (BID) | OROMUCOSAL | Status: DC
  Administered 2014-08-13 – 2014-08-21 (×17): 15 mL via OROMUCOSAL
  Filled 2014-08-13 (×18): qty 15

## 2014-08-13 MED ORDER — PERFLUTREN LIPID MICROSPHERE
INTRAVENOUS | Status: AC
  Administered 2014-08-13: 3 mL via INTRAVENOUS
  Filled 2014-08-13: qty 10

## 2014-08-13 MED ORDER — VANCOMYCIN HCL IN DEXTROSE 1-5 GM/200ML-% IV SOLN
1000.0000 mg | Freq: Once | INTRAVENOUS | Status: AC
  Administered 2014-08-13: 1000 mg via INTRAVENOUS
  Filled 2014-08-13: qty 200

## 2014-08-13 MED ORDER — HYDRALAZINE HCL 20 MG/ML IJ SOLN: 10.0000 mg | INTRAMUSCULAR | Status: DC | PRN

## 2014-08-13 MED ORDER — ALTEPLASE 2 MG IJ SOLR
2.0000 mg | Freq: Once | INTRAMUSCULAR | Status: AC | PRN
  Filled 2014-08-13: qty 2

## 2014-08-13 MED ORDER — FENTANYL BOLUS VIA INFUSION
50.0000 ug | INTRAVENOUS | Status: DC | PRN
  Filled 2014-08-13: qty 50

## 2014-08-13 MED ORDER — CISATRACURIUM BOLUS VIA INFUSION
0.0500 mg/kg | INTRAVENOUS | Status: DC | PRN
  Filled 2014-08-13: qty 7

## 2014-08-13 MED ORDER — SODIUM CHLORIDE 0.9 % IV SOLN: 2000.0000 mL | Freq: Once | INTRAVENOUS | Status: DC

## 2014-08-13 MED ORDER — FENTANYL CITRATE (PF) 100 MCG/2ML IJ SOLN: 100.0000 ug | Freq: Once | INTRAMUSCULAR | Status: DC

## 2014-08-13 MED ORDER — LIDOCAINE HCL (CARDIAC) 20 MG/ML IV SOLN
INTRAVENOUS | Status: AC
  Filled 2014-08-13: qty 5

## 2014-08-13 MED ORDER — PERFLUTREN LIPID MICROSPHERE
1.0000 mL | INTRAVENOUS | Status: AC | PRN
  Administered 2014-08-13: 3 mL via INTRAVENOUS
  Filled 2014-08-13: qty 10

## 2014-08-13 MED ORDER — CETYLPYRIDINIUM CHLORIDE 0.05 % MT LIQD
7.0000 mL | Freq: Four times a day (QID) | OROMUCOSAL | Status: DC
  Administered 2014-08-13 – 2014-08-21 (×32): 7 mL via OROMUCOSAL

## 2014-08-13 MED ORDER — INSULIN ASPART 100 UNIT/ML ~~LOC~~ SOLN
0.0000 [IU] | SUBCUTANEOUS | Status: DC
  Administered 2014-08-13: 1 [IU] via SUBCUTANEOUS
  Administered 2014-08-13 (×2): 2 [IU] via SUBCUTANEOUS
  Administered 2014-08-13 – 2014-08-18 (×14): 1 [IU] via SUBCUTANEOUS
  Administered 2014-08-19 (×2): 2 [IU] via SUBCUTANEOUS
  Administered 2014-08-19 (×2): 1 [IU] via SUBCUTANEOUS
  Administered 2014-08-20: 2 [IU] via SUBCUTANEOUS
  Administered 2014-08-20: 1 [IU] via SUBCUTANEOUS
  Administered 2014-08-20 (×2): 2 [IU] via SUBCUTANEOUS
  Administered 2014-08-20 – 2014-08-23 (×6): 1 [IU] via SUBCUTANEOUS

## 2014-08-13 MED ORDER — SODIUM CHLORIDE 0.9 % IV SOLN: Freq: Once | INTRAVENOUS | Status: DC

## 2014-08-13 MED FILL — Medication: Qty: 1 | Status: AC

## 2014-08-13 NOTE — ED Provider Notes (Signed)
CSN: [id number]     Arrival date & time 08/13/14  0117 History  This chart was scribed for Daniel Rice, MD by Evelene Croon, ED Scribe. This patient was seen in room [facility] and the patient's care was started 1:17 AM.     Chief Complaint  Patient presents with  . Cardiac Arrest  LEVEL 5 CAVEAT DUE TO Acuity of Medical Condition    The history is provided by the EMS personnel. No language interpreter was used.    HPI Comments:  Daniel Whitney is a 59 y.o. male brought in by ambulance, who presents to the Emergency Department unresponsive. Per EMS pt was found unresponsive at home; he was last seen well ~10 minutes prior to that. Upon EMS arrival to pt he was in asystole; CPR initiated at that time. En route the pt was given bicarb, 2 rounds of epi,  and 1 gram of calcium; a size 7 ET tube and a 20 gauge IV was placed in the right AC. EMS reports h/o MI and  ESRD; note pt missed his last 2 dialysis appointments due to recent back injury. Pt also noted to family prior to arresting that he was short of breath.   Past Medical History  Diagnosis Date  . Glaucoma   . Hypertension   . Hyperlipidemia   . Atrial fibrillation 12/30/2012  . CHF (congestive heart failure) 12/30/2012  . Diabetic retinopathy 12/30/2012  . Morbid obesity 12/30/2012  . Pulmonary hypertension 12/30/2012  . Myocardial infarction     "I've had ~ 3; last one was in ~ 10/2013" (01/30/2014)  . Diabetes mellitus with neuropathy   . OSA (obstructive sleep apnea) 12/30/2012    "lost weight; no longer needed CPAP; retested said I needed it; didn't followup cause I was feeling fine" (01/30/2014)  . GERD (gastroesophageal reflux disease)   . Stroke ~ 2005; ~ 2005    "they were mild; I didn't even notice I'd had them"; denies residual on 01/30/2014  . History of gout     "right big toe"  . Arthritis     "all over"  . Chronic lower back pain   . ESRD (end stage renal disease) on dialysis started in 2013    "MWF;  [facility]; [address]" (01/30/2014)  . Refusal of blood transfusions as patient is Jehovah's Witness   . Anemia due to other cause 07/07/2014  . Allergic rhinitis 07/07/2014   Past Surgical History  Procedure Laterality Date  . Total hip arthroplasty Right 1980  . Av fistula placement Left ~ 10/2013    "forearm"  . Closed reduction hip dislocation Right 1970's   Family History  Problem Relation Age of Onset  . Arthritis Other   . Heart disease Other   . Hyperlipidemia Other   . Stroke Other   . Hypertension Other   . Kidney disease Other   . Diabetes Other    History  Substance Use Topics  . Smoking status: Never Smoker   . Smokeless tobacco: Never Used  . Alcohol Use: 1.5 - 2.0 oz/week    3-4 Standard drinks or equivalent per week     Comment: 01/30/2014 "none in the last 3 weeks; primarily drink w/my friends on the weekends"    Review of Systems  Unable to perform ROS: Acuity of condition      Allergies  Review of patient's allergies indicates no known allergies.  Home Medications   Prior to Admission medications   Medication Sig Start Date End Date Taking?  Authorizing Provider  albuterol (PROVENTIL HFA;VENTOLIN HFA) 108 (90 BASE) MCG/ACT inhaler Inhale 2 puffs into the lungs every 6 (six) hours as needed for wheezing or shortness of breath. 01/31/14   Shanker Kristeen Mans, MD  allopurinol (ZYLOPRIM) 100 MG tablet TAKE ONE TABLET BY MOUTH TWICE DAILY 06/28/14   Lyndal Pulley, DO  amiodarone (PACERONE) 200 MG tablet Take 1 tablet (200 mg total) by mouth daily. 11/05/13   Lyndal Pulley, DO  amitriptyline (ELAVIL) 50 MG tablet TAKE ONE TABLET BY MOUTH ONCE DAILY AT BEDTIME 08/06/14   Biagio Borg, MD  Ascorbic Acid (VITAMIN C) 1000 MG tablet Take 1,000 mg by mouth daily.    Historical Provider, MD  b complex-vitamin c-folic acid (NEPHRO-VITE) 0.8 MG TABS tablet Take 1 tablet by mouth daily.    Historical Provider, MD  calcitonin, salmon, (MIACALCIN) 200 UNIT/ACT nasal  spray Place 1 spray into alternate nostrils daily. 08/04/14   Lyndal Pulley, DO  carvedilol (COREG) 25 MG tablet Take 25 mg by mouth 2 (two) times daily with a meal.    Historical Provider, MD  cetirizine (ZYRTEC) 10 MG tablet Take 1 tablet (10 mg total) by mouth daily. 07/07/14   Biagio Borg, MD  clindamycin (CLEOCIN T) 1 % lotion Apply topically 2 (two) times daily. 09/04/13   Biagio Borg, MD  dextromethorphan-guaiFENesin [facility] DM) 30-600 MG per 12 hr tablet Take 1 tablet by mouth 2 (two) times daily. 01/31/14   Shanker Kristeen Mans, MD  Eflornithine HCl (VANIQA) 13.9 % cream Apply 1 application topically 2 (two) times daily with a meal.     Historical Provider, MD  fluticasone (FLONASE) 50 MCG/ACT nasal spray Place 2 sprays into both nostrils daily. 01/31/14   Shanker Kristeen Mans, MD  HYDROcodone-acetaminophen (NORCO/VICODIN) 5-325 MG per tablet Take 1 tablet by mouth every 4 (four) hours as needed. 07/31/14   Al Corpus, PA-C  JANUVIA 25 MG tablet TAKE ONE TABLET BY MOUTH ONCE DAILY 06/28/14   Biagio Borg, MD  lidocaine (LIDODERM) 5 % Place 1 patch onto the skin daily. Remove & Discard patch within 12 hours or as directed by MD 04/25/14   Margarita Mail, PA-C  loratadine (CLARITIN) 10 MG tablet Take 1 tablet (10 mg total) by mouth daily. 01/31/14   Shanker Kristeen Mans, MD  Multiple Vitamin (MULTIVITAMIN WITH MINERALS) TABS tablet Take 1 tablet by mouth daily.    Historical Provider, MD  neomycin-polymyxin-hydrocortisone (CORTISPORIN) otic solution Place 4 drops into the right ear 4 (four) times daily. 10/27/13   Biagio Borg, MD  ondansetron (ZOFRAN) 4 MG tablet Take 1 tablet (4 mg total) by mouth every 6 (six) hours. 07/28/13   Cleatrice Burke, PA-C  OVER THE COUNTER MEDICATION 2 (two) times daily. Benzaderm Wash    Historical Provider, MD  OVER THE COUNTER MEDICATION 1 application 2 (two) times daily. Melaquin Complex    Historical Provider, MD  oxyCODONE-acetaminophen (PERCOCET) 5-325 MG per  tablet Take 1-2 tablets by mouth every 4 (four) hours as needed. 04/27/14   Lyndal Pulley, DO  PRESCRIPTION MEDICATION every Monday, Wednesday, and Friday. Dialysis    Historical Provider, MD  sevelamer carbonate (RENVELA) 800 MG tablet Take 1,600 mg by mouth 3 (three) times daily with meals.     Historical Provider, MD  simvastatin (ZOCOR) 20 MG tablet TAKE ONE TABLET BY MOUTH ONCE DAILY 08/06/14   Biagio Borg, MD  sodium chloride (OCEAN) 0.65 % SOLN nasal spray Place 1 spray  into both nostrils as needed for congestion. 01/31/14   Shanker Kristeen Mans, MD  tadalafil (CIALIS) 20 MG tablet Take 1 tablet (20 mg total) by mouth daily as needed for erectile dysfunction. 10/27/13   Biagio Borg, MD  TraMADol HCl 300 MG CP24 TAKE ONE CAPSULE BY MOUTH ONCE DAILY 06/29/14   Biagio Borg, MD  triamcinolone (NASACORT AQ) 55 MCG/ACT AERO nasal inhaler Place 2 sprays into the nose daily. 07/07/14   Biagio Borg, MD  valsartan (DIOVAN) 40 MG tablet Take 40 mg by mouth 3 (three) times a week. Takes on Sunday, Tuesday, and Saturday    Historical Provider, MD  Vitamin D, Ergocalciferol, (DRISDOL) 50000 UNITS CAPS capsule Take 1 capsule (50,000 Units total) by mouth every 7 (seven) days. 08/04/14   Lyndal Pulley, DO  warfarin (COUMADIN) 3 MG tablet Take 1-1.5 tablets (3-4.5 mg total) by mouth See admin instructions. Please hold Coumadin 01/31/14-take 0.5 tablet on 02/01/14-and then resume usual dosing of on Thursday 1.5 tablet and on the remaining days you take 1 tabletTake as directed by anticoagulation clinic. Please make sure you check your INR in the next 2 days 01/31/14   Jonetta Osgood, MD   BP 189/89 mmHg  Pulse 79  Temp(Src) 94.8 F (34.9 C)  Resp 12  Wt 300 lb (136.079 kg)  SpO2 96% Physical Exam  Constitutional: He appears well-developed and well-nourished. No distress.  HENT:  Head: Normocephalic and atraumatic.  Mouth/Throat: Oropharynx is clear and moist.  ET tube in place  Eyes:  Pupils 3 mm  bilaterally and sluggish.  Neck: Normal range of motion. Neck supple.  Cardiovascular: Normal rate and regular rhythm.   Pulmonary/Chest: Breath sounds normal.  Bilateral breath sounds though diminished possibly due to body habitus.  Abdominal: Soft. Bowel sounds are normal. He exhibits no distension and no mass. There is no tenderness. There is no rebound and no guarding.  Musculoskeletal: Normal range of motion. He exhibits no edema or tenderness.  Palpable thrill left forearm. No lower extremity swelling  Neurological:  Patient making few spontaneous movements but does not appear to be purposeful.   Skin: Skin is warm and dry. No rash noted. No erythema.  Nursing note and vitals reviewed.   ED Course  Procedures   DIAGNOSTIC STUDIES:  Oxygen Saturation is 100% on ETT, normal by my interpretation.    COORDINATION OF CARE:  1:30 AM Will order imaging studies, blood work and EKG  Labs Review Labs Reviewed  URINALYSIS, ROUTINE W REFLEX MICROSCOPIC (NOT AT [facility]) - Abnormal; Notable for the following:    APPearance CLOUDY (*)    Hgb urine dipstick MODERATE (*)    Protein, ur 100 (*)    Leukocytes, UA SMALL (*)    All other components within normal limits  CBC WITH DIFFERENTIAL/PLATELET - Abnormal; Notable for the following:    RBC 3.69 (*)    Hemoglobin 11.0 (*)    HCT 34.3 (*)    Neutrophils Relative % 90 (*)    Neutro Abs 8.1 (*)    Lymphocytes Relative 6 (*)    Lymphs Abs 0.6 (*)    All other components within normal limits  URINE MICROSCOPIC-ADD ON - Abnormal; Notable for the following:    Casts HYALINE CASTS (*)    All other components within normal limits  I-STAT CHEM 8, ED - Abnormal; Notable for the following:    Sodium 131 (*)    Potassium 5.2 (*)    Chloride 95 (*)  BUN 106 (*)    Creatinine, Ser 10.70 (*)    Glucose, Bld 149 (*)    Hemoglobin 12.9 (*)    HCT 38.0 (*)    All other components within normal limits  I-STAT ARTERIAL BLOOD GAS, ED -  Abnormal; Notable for the following:    pH, Arterial 7.128 (*)    pCO2 arterial 78.9 (*)    pO2, Arterial 218.0 (*)    Bicarbonate 26.1 (*)    Acid-base deficit 5.0 (*)    All other components within normal limits  I-STAT CG4 LACTIC ACID, ED - Abnormal; Notable for the following:    Lactic Acid, Venous 2.93 (*)    All other components within normal limits  I-STAT ARTERIAL BLOOD GAS, ED - Abnormal; Notable for the following:    pCO2 arterial 31.9 (*)    pO2, Arterial 68.0 (*)    Acid-base deficit 4.0 (*)    All other components within normal limits  I-STAT CHEM 8, ED - Abnormal; Notable for the following:    Sodium 128 (*)    Potassium 5.2 (*)    Chloride 97 (*)    BUN 98 (*)    Creatinine, Ser 10.50 (*)    Glucose, Bld 197 (*)    Calcium, Ion 1.07 (*)    All other components within normal limits  CULTURE, BLOOD (ROUTINE X 2)  CULTURE, BLOOD (ROUTINE X 2)  COMPREHENSIVE METABOLIC PANEL  TRIGLYCERIDES  I-STAT TROPOININ, ED  I-STAT TROPOININ, ED    Imaging Review Dg Chest Portable 1 View  08/13/2014   CLINICAL DATA:  Endotracheal intubation  EXAM: PORTABLE CHEST - 1 VIEW  COMPARISON:  08/13/2014 at 01:31  FINDINGS: Endotracheal tube is 2.5 cm above the carina. Nasogastric tube extends into the stomach. Linear left base opacities persist without significant interval change. Right lung is grossly clear. There is no large effusion. There is no pneumothorax.  IMPRESSION: Support equipment appears satisfactorily positioned.  No significant interval change in the linear left base opacities.   Electronically Signed   By: Andreas Newport M.D.   On: 08/13/2014 02:03   Dg Chest Port 1 View  08/13/2014   CLINICAL DATA:  Status post CPR status post intubation  EXAM: PORTABLE CHEST - 1 VIEW  COMPARISON:  01/29/2014  FINDINGS: Cardiac shadow is enlarged. An endotracheal tube is now seen at the level of the aortic knob in satisfactory position. A nasogastric catheter is noted extending into the  distal esophagus. Its tip is not visualized on this exam. The lungs are well aerated. Slight increased density is noted in the left base although this may be in part due to overlying extrinsic artifact. No acute bony abnormality is seen.  IMPRESSION: Tubes and lines as described above.  Possible early left basilar infiltrate.   Electronically Signed   By: Inez Catalina M.D.   On: 08/13/2014 01:41     EKG Interpretation   Date/Time:  Friday August 13 2014 01:38:42 EDT Ventricular Rate:  59 PR Interval:    QRS Duration: 111 QT Interval:  485 QTC Calculation: 480 R Axis:   -91 Text Interpretation:  Age not entered, assumed to be  59 years old for  purpose of ECG interpretation Atrial fibrillation LAD, consider left  anterior fascicular block Low voltage, extremity leads Abnormal R-wave  progression, late transition Borderline prolonged QT interval Confirmed by  Lita Mains  MD, Zamyia Gowell ([id number]) on 08/13/2014 3:59:59 AM     CRITICAL CARE Performed by: Lita Mains, Demorio Seeley Total critical  care time: 45 min Critical care time was exclusive of separately billable procedures and treating other patients. Critical care was necessary to treat or prevent imminent or life-threatening deterioration. Critical care was time spent personally by me on the following activities: development of treatment plan with patient and/or surrogate as well as nursing, discussions with consultants, evaluation of patient's response to treatment, examination of patient, obtaining history from patient or surrogate, ordering and performing treatments and interventions, ordering and review of laboratory studies, ordering and review of radiographic studies, pulse oximetry and re-evaluation of patient's condition.  MDM   Final diagnoses:  Cardiopulmonary arrest    I personally performed the services described in this documentation, which was scribed in my presence. The recorded information has been reviewed and is accurate.  Patient  presents status post rest. Initially paced by EMS. The patient's pulse ox remained in 60s to 70s, maintaining adequate blood pressure. ET tube was exchanged for a larger size. See resident procedure note. Initial potassium was only 5.2. Did not appear to be in fluid overload. No evidence of pulmonary edema on the chest x-ray. Questionable left basilar infiltrate. Blood cultures drawn. Initiated broad-spectrum antibiotics though low suspicion this is the cause for the patient's cardiac arrest. Discussed with critical care team. Will see the patient in the emergency department and admit. Cooling initiated in the emergency department with placement of ice bags in axilla. Patient had questionable seizure like activity lasting only a few seconds. Critical care at bedside.   Daniel Rice, MD 08/13/14 0400

## 2014-08-13 NOTE — ED Provider Notes (Signed)
INTUBATION Performed by: Addison Lank  Required items: required blood products, implants, devices, and special equipment available Patient identity confirmed: provided demographic data and hospital-assigned identification number Time out: Immediately prior to procedure a "time out" was called to verify the correct patient, procedure, equipment, support staff and site/side marked as required.  Indications: Inplace ETT too small (7.0 cuffed) for pt. Difficult to ventilate  Intubation method: initially attempted Seldinger which was unsuccessful due to redundant tissue. Glidescope Laryngoscopy used   Preoxygenation: BVM through ETT  Sedatives: Ketamine Paralytic: Rocuronium  Tube Size: 8.0 cuffed  Post-procedure assessment: chest rise and ETCO2 monitor Breath sounds: equal and absent over the epigastrium Tube secured with: ETT holder Chest x-ray interpreted by radiologist and me.  Chest x-ray findings: endotracheal tube in appropriate position  Patient tolerated the procedure well with no immediate complications.     Addison Lank, MD 08/13/14 [id number]  Julianne Rice, MD 08/14/14 [phone]

## 2014-08-13 NOTE — Progress Notes (Signed)
CRITICAL VALUE ALERT  Critical value received:  K=6.5  Date of notification:  08/13/2014  Time of notification: 0600  Critical value read back:yes  Nurse who received alert:  Purcell Nails  MD notified (1st page): Janann Colonel  Time of first page:  0600  MD notified (2nd page):  Time of second page:  Responding MD: Janann Colonel  Time MD responded:  [phone]

## 2014-08-13 NOTE — Progress Notes (Signed)
Bedside EEG completed, results pending. 

## 2014-08-13 NOTE — Progress Notes (Signed)
eLink Physician-Brief Progress Note Patient Name: Daniel Whitney DOB: 1955/09/02 MRN: [id number]   Date of Service  08/13/2014  HPI/Events of Note  Multiple issues: 1. K+ = 6.5 and 2. Blood glucose = 160. Hx of ESRD. Nephrology consulted for RRT.  eICU Interventions  Will order sensitive Novolog SSI Q 4 hours.      Intervention Category Intermediate Interventions: Hyperglycemia - evaluation and treatment;Electrolyte abnormality - evaluation and management  [name] 08/13/2014, 6:09 AM

## 2014-08-13 NOTE — Progress Notes (Signed)
Artic sun machine was changed because of problem with low flow. Therapy started at 0600

## 2014-08-13 NOTE — Consult Note (Signed)
Renal Service Consult Note [facility]  Giavanni Lano 08/13/2014 Brady D Requesting Physician:  Dr Alva Garnet  Reason for Consult:  ESRD pt w cardiac arrest HPI: The patient is a 59 y.o. year-old with hx HTN, DM, CAD/ MI, ESRD, afib and CHF. He was found unresponsive at home, EMS came and he was in asystole. CPR started by FD. Pt reportedly was SOB before passing out. Resuscitated and intubated , now in ICU on cooling protocol. K 5.4. CXR bilat pulm edema.    Chart review: 12/15 - acute bronchitis, esrd on HD, afib on amio/ BB. Coumadin/ , HTN, diast HF, pulm HTN, OSA, DM2 on oral agents   ROS  n/a  Past Medical History  Past Medical History  Diagnosis Date  . Glaucoma   . Hypertension   . Hyperlipidemia   . Atrial fibrillation 12/30/2012  . CHF (congestive heart failure) 12/30/2012  . Diabetic retinopathy 12/30/2012  . Morbid obesity 12/30/2012  . Pulmonary hypertension 12/30/2012  . Myocardial infarction     "I've had ~ 3; last one was in ~ 10/2013" (01/30/2014)  . Diabetes mellitus with neuropathy   . OSA (obstructive sleep apnea) 12/30/2012    "lost weight; no longer needed CPAP; retested said I needed it; didn't followup cause I was feeling fine" (01/30/2014)  . GERD (gastroesophageal reflux disease)   . Stroke ~ 2005; ~ 2005    "they were mild; I didn't even notice I'd had them"; denies residual on 01/30/2014  . History of gout     "right big toe"  . Arthritis     "all over"  . Chronic lower back pain   . ESRD (end stage renal disease) on dialysis started in 2013    "MWF; [facility]; [address]" (01/30/2014)  . Refusal of blood transfusions as patient is Jehovah's Witness   . Anemia due to other cause 07/07/2014  . Allergic rhinitis 07/07/2014   Past Surgical History  Past Surgical History  Procedure Laterality Date  . Total hip arthroplasty Right 1980  . Av fistula placement Left ~ 10/2013    "forearm"  . Closed reduction hip dislocation  Right 1970's   Family History  Family History  Problem Relation Age of Onset  . Arthritis Other   . Heart disease Other   . Hyperlipidemia Other   . Stroke Other   . Hypertension Other   . Kidney disease Other   . Diabetes Other    Social History  reports that he has never smoked. He has never used smokeless tobacco. He reports that he drinks about 1.5 - 2.0 oz of alcohol per week. He reports that he does not use illicit drugs. Allergies No Known Allergies Home medications Prior to Admission medications   Medication Sig Start Date End Date Taking? Authorizing Provider  albuterol (PROVENTIL HFA;VENTOLIN HFA) 108 (90 BASE) MCG/ACT inhaler Inhale 2 puffs into the lungs every 6 (six) hours as needed for wheezing or shortness of breath. 01/31/14   Shanker Kristeen Mans, MD  allopurinol (ZYLOPRIM) 100 MG tablet TAKE ONE TABLET BY MOUTH TWICE DAILY 06/28/14   Lyndal Pulley, DO  amiodarone (PACERONE) 200 MG tablet Take 1 tablet (200 mg total) by mouth daily. 11/05/13   Lyndal Pulley, DO  amitriptyline (ELAVIL) 50 MG tablet TAKE ONE TABLET BY MOUTH ONCE DAILY AT BEDTIME 08/06/14   Biagio Borg, MD  Ascorbic Acid (VITAMIN C) 1000 MG tablet Take 1,000 mg by mouth daily.    Historical Provider, MD  b complex-vitamin c-folic acid (NEPHRO-VITE) 0.8 MG TABS tablet Take 1 tablet by mouth daily.    Historical Provider, MD  calcitonin, salmon, (MIACALCIN) 200 UNIT/ACT nasal spray Place 1 spray into alternate nostrils daily. 08/04/14   Lyndal Pulley, DO  carvedilol (COREG) 25 MG tablet Take 25 mg by mouth 2 (two) times daily with a meal.    Historical Provider, MD  cetirizine (ZYRTEC) 10 MG tablet Take 1 tablet (10 mg total) by mouth daily. 07/07/14   Biagio Borg, MD  clindamycin (CLEOCIN T) 1 % lotion Apply topically 2 (two) times daily. 09/04/13   Biagio Borg, MD  dextromethorphan-guaiFENesin  [facility] DM) 30-600 MG per 12 hr tablet Take 1 tablet by mouth 2 (two) times daily. 01/31/14   Shanker Kristeen Mans, MD   Eflornithine HCl (VANIQA) 13.9 % cream Apply 1 application topically 2 (two) times daily with a meal.     Historical Provider, MD  fluticasone (FLONASE) 50 MCG/ACT nasal spray Place 2 sprays into both nostrils daily. 01/31/14   Shanker Kristeen Mans, MD  HYDROcodone-acetaminophen (NORCO/VICODIN) 5-325 MG per tablet Take 1 tablet by mouth every 4 (four) hours as needed. 07/31/14   Al Corpus, PA-C  JANUVIA 25 MG tablet TAKE ONE TABLET BY MOUTH ONCE DAILY 06/28/14   Biagio Borg, MD  lidocaine (LIDODERM) 5 % Place 1 patch onto the skin daily. Remove & Discard patch within 12 hours or as directed by MD 04/25/14   Margarita Mail, PA-C  loratadine (CLARITIN) 10 MG tablet Take 1 tablet (10 mg total) by mouth daily. 01/31/14   Shanker Kristeen Mans, MD  Multiple Vitamin (MULTIVITAMIN WITH MINERALS) TABS tablet Take 1 tablet by mouth daily.    Historical Provider, MD  neomycin-polymyxin-hydrocortisone (CORTISPORIN) otic solution Place 4 drops into the right ear 4 (four) times daily. 10/27/13   Biagio Borg, MD  ondansetron (ZOFRAN) 4 MG tablet Take 1 tablet (4 mg total) by mouth every 6 (six) hours. 07/28/13   Cleatrice Burke, PA-C  OVER THE COUNTER MEDICATION 2 (two) times daily. Benzaderm Wash    Historical Provider, MD  OVER THE COUNTER MEDICATION 1 application 2 (two) times daily. Melaquin Complex    Historical Provider, MD  oxyCODONE-acetaminophen (PERCOCET) 5-325 MG per tablet Take 1-2 tablets by mouth every 4 (four) hours as needed. 04/27/14   Lyndal Pulley, DO  PRESCRIPTION MEDICATION every Monday, Wednesday, and Friday. Dialysis    Historical Provider, MD  sevelamer carbonate (RENVELA) 800 MG tablet Take 1,600 mg by mouth 3 (three) times daily with meals.     Historical Provider, MD  simvastatin (ZOCOR) 20 MG tablet TAKE ONE TABLET BY MOUTH ONCE DAILY 08/06/14   Biagio Borg, MD  sodium chloride (OCEAN) 0.65 % SOLN nasal spray Place 1 spray into both nostrils as needed for congestion. 01/31/14   Shanker Kristeen Mans, MD  tadalafil (CIALIS) 20 MG tablet Take 1 tablet (20 mg total) by mouth daily as needed for erectile dysfunction. 10/27/13   Biagio Borg, MD  TraMADol HCl 300 MG CP24 TAKE ONE CAPSULE BY MOUTH ONCE DAILY 06/29/14   Biagio Borg, MD  triamcinolone (NASACORT AQ) 55 MCG/ACT AERO nasal inhaler Place 2 sprays into the nose daily. 07/07/14   Biagio Borg, MD  valsartan (DIOVAN) 40 MG tablet Take 40 mg by mouth 3 (three) times a week. Takes on Sunday, Tuesday, and Saturday    Historical Provider, MD  Vitamin D, Ergocalciferol, (DRISDOL) 50000 UNITS CAPS capsule Take  1 capsule (50,000 Units total) by mouth every 7 (seven) days. 08/04/14   Lyndal Pulley, DO  warfarin (COUMADIN) 3 MG tablet Take 1-1.5 tablets (3-4.5 mg total) by mouth See admin instructions. Please hold Coumadin 01/31/14-take 0.5 tablet on 02/01/14-and then resume usual dosing of on Thursday 1.5 tablet and on the remaining days you take 1 tabletTake as directed by anticoagulation clinic. Please make sure you check your INR in the next 2 days 01/31/14   Jonetta Osgood, MD   Liver Function Tests  Recent Labs Lab 08/13/14 0222  AST 13*  ALT 17  ALKPHOS 164*  BILITOT 0.6  PROT 7.1  ALBUMIN 3.3*   No results for input(s): LIPASE, AMYLASE in the last 168 hours. CBC  Recent Labs Lab 08/13/14 0142 08/13/14 0222 08/13/14 0353  WBC  --  9.2  --   NEUTROABS  --  8.1*  --   HGB 12.9* 11.0* 15.0  HCT 38.0* 34.3* 44.0  MCV  --  93.0  --   PLT  --  212  --    Basic Metabolic Panel  Recent Labs Lab 08/13/14 0142 08/13/14 0222 08/13/14 0353 08/13/14 0647 08/13/14 0648  NA 131* 132* 128* 130*  --   K 5.2* 5.3* 5.2* 5.4*  --   CL 95* 90* 97* 90*  --   CO2  --  20*  --  15*  --   GLUCOSE 149* 142* 197* 181*  --   BUN 106* 114* 98* 122*  --   CREATININE 10.70* 10.48* 10.50* 10.13*  --   CALCIUM  --  10.0  --  9.6  --   PHOS  --   --   --   --  10.3*    Filed Vitals:   08/13/14 0725 08/13/14 0730 08/13/14 0800  08/13/14 0830  BP: 132/79 131/74 124/67   Pulse: 59 59 58   Temp:  92.3 F (33.5 C) 91.2 F (32.9 C) 90.5 F (32.5 C)  TempSrc:   Core (Comment) Core (Comment)  Resp: 25 18 25    Height:      Weight:      SpO2: 95% 88% 92%    Exam On vent, unresponsive No rash, cyanosis or gangrene Sclera anicteric, throat clear Frothy secretions in ETT Chest bilat diffuse coarse insp /exp rales RRR no MRG Abd soft ntnd no mass or ascites Ext 2+ pitting LE edema bilat LFA AVF patent Neuro is unresponsive  TTS [place]   4h  119kg   LFA AVF  Heparin 3600  450/800 Hect 4  ,  Venofer 50/wk,  mircera 75 q2wks last 6/21  Assessment: 1. Cardiac arrest, on cooling protocol 2. Pulm edema 3. ESRD on HD 4. Anemia - Hb up, hold esa for now 5. MBD cont vit D w HD for now hold binders 6. HTN on 2 BP meds at home 7. DM on oral agents 8. AFib on coumadin/ BB/ amio , INR up ~6   Plan- HD this am, small needles, max UF  Kelly Splinter MD (pgr) [phone]    [phone] 08/13/2014, 8:40 AM

## 2014-08-13 NOTE — Plan of Care (Signed)
Problem: Consults Goal: Cardiac Arrest Patient Education (See Patient Education module for education specifics.) Outcome: Not Met (add Reason) unresponsive

## 2014-08-13 NOTE — Progress Notes (Signed)
*  PRELIMINARY RESULTS* Echocardiogram 2D Echocardiogram has been performed.  Leavy Cella 08/13/2014, 2:16 PM

## 2014-08-13 NOTE — Progress Notes (Signed)
ABG that's in for 1207 is venous blood. Dr. Alva Garnet was made aware.

## 2014-08-13 NOTE — Progress Notes (Addendum)
Orders received for FFP. Patient is Daniel Whitney witness with past refusal of blood products. Attempted to call sister [name] [phone] and brother [name] [phone] with no answer from either. Message left awaiting return phone call. Spoke with sister who confirms that patient is not to receive blood products. She states that there are certain components that he can receive but she is not sure what they are and to hold off until she speaks with her brother who is in the hospital and will come in to speak with the nurse.Richardson Landry Minor NP on unit and made aware.

## 2014-08-13 NOTE — Procedures (Signed)
Central Venous Catheter Insertion Procedure Note Daniel Whitney [id number] 1955/11/12  Procedure: Insertion of Central Venous Catheter Indications: Assessment of intravascular volume, Drug and/or fluid administration and Frequent blood sampling  Procedure Details Consent: Risks of procedure as well as the alternatives and risks of each were explained to the (patient/caregiver).  Consent for procedure obtained. Time Out: Verified patient identification, verified procedure, site/side was marked, verified correct patient position, special equipment/implants available, medications/allergies/relevent history reviewed, required imaging and test results available.  Performed  Maximum sterile technique was used including antiseptics, cap, gloves, gown, hand hygiene, mask and sheet. Skin prep: Chlorhexidine; local anesthetic administered A antimicrobial bonded/coated triple lumen catheter was placed in the left internal jugular vein using the Seldinger technique. Ultrasound guidance used.Yes.   Catheter placed to 20 cm. Blood aspirated via all 3 ports and then flushed x 3. Line sutured x 2 and dressing applied.  Evaluation Blood flow good Complications: No apparent complications Patient did tolerate procedure well. Chest X-ray ordered to verify placement.  CXR: pending.  Richardson Landry Minor ACNP Maryanna Shape PCCM Pager [phone] till 3 pm If no answer page [phone] 08/13/2014, 7:25 AM   Merton Border, MD ; [facility] service Mobile [phone].  After 5:30 PM or weekends, call [phone]

## 2014-08-13 NOTE — Progress Notes (Signed)
Attempted A-line x2 in right radial artery. Catheter misthreaded leaving a large hematoma on pt's right wrist. Pressure dressing applied and will continue to monitor site. NP made aware.

## 2014-08-13 NOTE — ED Notes (Signed)
Patient found unresponsive at home, family called EMS, patient found pulseless and apneic and CPR started by FD.  Patient is a dialysis patient and has not gone to dialysis and has missed at least 2 sessions.  Patient was found in asystole, had a 10 minute downtime.  Patient was complaining of shortness of breath prior to arresting.  Patient was given on amp bicarb, 2 amps epi and one amp of calcium en route to ED.  Patient was intubated with 7.0 tube by EMS.

## 2014-08-13 NOTE — ED Notes (Signed)
Pt in ems. Found at home unresponsive, down for 10 min. cpr started by FD. asytole rhythm. Did C/O SOBt to son before going unresponsive. Previous hx MI. Dialysis pt, has not been to past 2 appt. 7et. [id number] systolic by EMS. Given 2 epi, 26meq na bicarb, 00 ns, 1 g Ca.

## 2014-08-13 NOTE — H&P (Addendum)
PULMONARY / CRITICAL CARE MEDICINE   Name: Daniel Whitney MRN: [id number] DOB: April 23, 1955    ADMISSION DATE:  08/13/2014 CONSULTATION DATE:  08/13/2014  REFERRING MD :  Dr. Lita Mains  CHIEF COMPLAINT: Asystole  INITIAL PRESENTATION:  59 year old male with ESRD on HD had missed his last 2 dialysis treatments. 7/01 early AM he developed acute onset SOB and then became unresponsive. EMS noted asystole and began CPR. Total downtime estimated at about 20 mins. In ED he had several runs of VT, but was otherwise was hemodynamically stable on ventilator. PCCM to admit.  STUDIES:  7/01 CT head: NAICP 7/01 EEG: This sedated EEG is abnormal due to moderate diffuse slowing and suppression of the background 7/01 TTE: LVEF 20%  SIGNIFICANT EVENTS: 7/1 cardiac arrest asystole > to ICU. Arcitc sun protocol   HISTORY OF PRESENT ILLNESS:  59 year old male with PMH significant for ESRD on HD, Afib, CHF, DM, PAH, DM, OSA, CVA, and MI. His normal dialysis schedule is TTS, however, due pain related to recent falls, he has missed the last 2 treatments. 7/1 early AM he was at home in his USOH when he developed acute onset SOB. Family states that his mental status began to wax and wane at that time, and shortly after he became unresponsive. EMS was called, family states that they performed CPR prior to EMS arrival, however, EMS states that no one was doing this when they got there. EMS noted asystole and began CPR, which required about 10 minutes before ROSC. Once in ED he was intubated (noted to have difficult airway). ER staff reports obtundation without any sedation and hemodynamic stability without any vasoactive agents. He did have 2 runs of VT, which spontaneously converted back to NSR. PCCM called to admit. Of note, patient is a Acupuncturist witness and has refused blood transfusions for this reason in the past.   PAST MEDICAL HISTORY :   has a past medical history of Glaucoma; Hypertension; Hyperlipidemia; Atrial  fibrillation (12/30/2012); CHF (congestive heart failure) (12/30/2012); Diabetic retinopathy (12/30/2012); Morbid obesity (12/30/2012); Pulmonary hypertension (12/30/2012); Myocardial infarction; Diabetes mellitus with neuropathy; OSA (obstructive sleep apnea) (12/30/2012); GERD (gastroesophageal reflux disease); Stroke (~ 2005; ~ 2005); History of gout; Arthritis; Chronic lower back pain; ESRD (end stage renal disease) on dialysis (started in 2013); Refusal of blood transfusions as patient is Jehovah's Witness; Anemia due to other cause (07/07/2014); and Allergic rhinitis (07/07/2014).  has past surgical history that includes Total hip arthroplasty (Right, 1980); AV fistula placement (Left, ~ 10/2013); and Closed reduction hip dislocation (Right, 1970's). Prior to Admission medications   Medication Sig Start Date End Date Taking? Authorizing Provider  albuterol (PROVENTIL HFA;VENTOLIN HFA) 108 (90 BASE) MCG/ACT inhaler Inhale 2 puffs into the lungs every 6 (six) hours as needed for wheezing or shortness of breath. 01/31/14   Shanker Kristeen Mans, MD  allopurinol (ZYLOPRIM) 100 MG tablet TAKE ONE TABLET BY MOUTH TWICE DAILY 06/28/14   Lyndal Pulley, DO  amiodarone (PACERONE) 200 MG tablet Take 1 tablet (200 mg total) by mouth daily. 11/05/13   Lyndal Pulley, DO  amitriptyline (ELAVIL) 50 MG tablet TAKE ONE TABLET BY MOUTH ONCE DAILY AT BEDTIME 08/06/14   Biagio Borg, MD  Ascorbic Acid (VITAMIN C) 1000 MG tablet Take 1,000 mg by mouth daily.    Historical Provider, MD  b complex-vitamin c-folic acid (NEPHRO-VITE) 0.8 MG TABS tablet Take 1 tablet by mouth daily.    Historical Provider, MD  calcitonin, salmon, (MIACALCIN) 200  UNIT/ACT nasal spray Place 1 spray into alternate nostrils daily. 08/04/14   Lyndal Pulley, DO  carvedilol (COREG) 25 MG tablet Take 25 mg by mouth 2 (two) times daily with a meal.    Historical Provider, MD  cetirizine (ZYRTEC) 10 MG tablet Take 1 tablet (10 mg total) by mouth daily.  07/07/14   Biagio Borg, MD  clindamycin (CLEOCIN T) 1 % lotion Apply topically 2 (two) times daily. 09/04/13   Biagio Borg, MD  dextromethorphan-guaiFENesin [facility] DM) 30-600 MG per 12 hr tablet Take 1 tablet by mouth 2 (two) times daily. 01/31/14   Shanker Kristeen Mans, MD  Eflornithine HCl (VANIQA) 13.9 % cream Apply 1 application topically 2 (two) times daily with a meal.     Historical Provider, MD  fluticasone (FLONASE) 50 MCG/ACT nasal spray Place 2 sprays into both nostrils daily. 01/31/14   Shanker Kristeen Mans, MD  HYDROcodone-acetaminophen (NORCO/VICODIN) 5-325 MG per tablet Take 1 tablet by mouth every 4 (four) hours as needed. 07/31/14   Al Corpus, PA-C  JANUVIA 25 MG tablet TAKE ONE TABLET BY MOUTH ONCE DAILY 06/28/14   Biagio Borg, MD  lidocaine (LIDODERM) 5 % Place 1 patch onto the skin daily. Remove & Discard patch within 12 hours or as directed by MD 04/25/14   Margarita Mail, PA-C  loratadine (CLARITIN) 10 MG tablet Take 1 tablet (10 mg total) by mouth daily. 01/31/14   Shanker Kristeen Mans, MD  Multiple Vitamin (MULTIVITAMIN WITH MINERALS) TABS tablet Take 1 tablet by mouth daily.    Historical Provider, MD  neomycin-polymyxin-hydrocortisone (CORTISPORIN) otic solution Place 4 drops into the right ear 4 (four) times daily. 10/27/13   Biagio Borg, MD  ondansetron (ZOFRAN) 4 MG tablet Take 1 tablet (4 mg total) by mouth every 6 (six) hours. 07/28/13   Cleatrice Burke, PA-C  OVER THE COUNTER MEDICATION 2 (two) times daily. Benzaderm Wash    Historical Provider, MD  OVER THE COUNTER MEDICATION 1 application 2 (two) times daily. Melaquin Complex    Historical Provider, MD  oxyCODONE-acetaminophen (PERCOCET) 5-325 MG per tablet Take 1-2 tablets by mouth every 4 (four) hours as needed. 04/27/14   Lyndal Pulley, DO  PRESCRIPTION MEDICATION every Monday, Wednesday, and Friday. Dialysis    Historical Provider, MD  sevelamer carbonate (RENVELA) 800 MG tablet Take 1,600 mg by mouth 3 (three) times  daily with meals.     Historical Provider, MD  simvastatin (ZOCOR) 20 MG tablet TAKE ONE TABLET BY MOUTH ONCE DAILY 08/06/14   Biagio Borg, MD  sodium chloride (OCEAN) 0.65 % SOLN nasal spray Place 1 spray into both nostrils as needed for congestion. 01/31/14   Shanker Kristeen Mans, MD  tadalafil (CIALIS) 20 MG tablet Take 1 tablet (20 mg total) by mouth daily as needed for erectile dysfunction. 10/27/13   Biagio Borg, MD  TraMADol HCl 300 MG CP24 TAKE ONE CAPSULE BY MOUTH ONCE DAILY 06/29/14   Biagio Borg, MD  triamcinolone (NASACORT AQ) 55 MCG/ACT AERO nasal inhaler Place 2 sprays into the nose daily. 07/07/14   Biagio Borg, MD  valsartan (DIOVAN) 40 MG tablet Take 40 mg by mouth 3 (three) times a week. Takes on Sunday, Tuesday, and Saturday    Historical Provider, MD  Vitamin D, Ergocalciferol, (DRISDOL) 50000 UNITS CAPS capsule Take 1 capsule (50,000 Units total) by mouth every 7 (seven) days. 08/04/14   Lyndal Pulley, DO  warfarin (COUMADIN) 3 MG tablet Take 1-1.5  tablets (3-4.5 mg total) by mouth See admin instructions. Please hold Coumadin 01/31/14-take 0.5 tablet on 02/01/14-and then resume usual dosing of on Thursday 1.5 tablet and on the remaining days you take 1 tabletTake as directed by anticoagulation clinic. Please make sure you check your INR in the next 2 days 01/31/14   Jonetta Osgood, MD   No Known Allergies  FAMILY HISTORY:  has no family status information on file.  SOCIAL HISTORY:  reports that he has never smoked. He has never used smokeless tobacco. He reports that he drinks about 1.5 - 2.0 oz of alcohol per week. He reports that he does not use illicit drugs.  REVIEW OF SYSTEMS:  unable  SUBJECTIVE:   VITAL SIGNS: Temp:  [94.3 F (34.6 C)-94.6 F (34.8 C)] 94.6 F (34.8 C) (07/01 0300) Pulse Rate:  [58-72] 69 (07/01 0300) Resp:  [12-30] 30 (07/01 0300) BP: (87-192)/(33-107) 148/33 mmHg (07/01 0300) SpO2:  [97 %-100 %] 100 % (07/01 0300) FiO2 (%):  [60 %-100 %]  60 % (07/01 0148) HEMODYNAMICS:   VENTILATOR SETTINGS: Vent Mode:  [-] PRVC FiO2 (%):  [60 %-100 %] 60 % Set Rate:  [24 bmp-30 bmp] 30 bmp Vt Set:  [500 mL-580 mL] 580 mL PEEP:  [5 cmH20] 5 cmH20 Plateau Pressure:  [29 cmH20] 29 cmH20 INTAKE / OUTPUT:  Intake/Output Summary (Last 24 hours) at 08/13/14 0324 Last data filed at 08/13/14 [id number]  Gross per 24 hour  Intake    500 ml  Output      0 ml  Net    500 ml    PHYSICAL EXAMINATION: General: intubated, sedated, paralyzed Neuro:  Limited exam due to NMB, PERRL HEENT:  [place]/AT, JVD cannot be assessed Cardiovascular:  Reg, no M Lungs:  Coarse bilateral breath sounds, pink frothy secretions in ETT Abdomen:  Obese, soft, non-distended Ext:  +2 symmetric BLE edema Skin:  Grossly intact  LABS:  CBC  Recent Labs Lab 08/13/14 0142  HGB 12.9*  HCT 38.0*   Coag's No results for input(s): APTT, INR in the last 168 hours. BMET  Recent Labs Lab 08/13/14 0142  NA 131*  K 5.2*  CL 95*  BUN 106*  CREATININE 10.70*  GLUCOSE 149*   Electrolytes No results for input(s): CALCIUM, MG, PHOS in the last 168 hours. Sepsis Markers  Recent Labs Lab 08/13/14 0143  LATICACIDVEN 2.93*   ABG  Recent Labs Lab 08/13/14 0135 08/13/14 0243  PHART 7.128* 7.401  PCO2ART 78.9* 31.9*  PO2ART 218.0* 68.0*   Liver Enzymes No results for input(s): AST, ALT, ALKPHOS, BILITOT, ALBUMIN in the last 168 hours. Cardiac Enzymes No results for input(s): TROPONINI, PROBNP in the last 168 hours. Glucose No results for input(s): GLUCAP in the last 168 hours.  CXR: cardiomegaly, vasc congestion, basilar atelectasis, no overt edema CXR (same day): severe edema/ARDS pattern   ASSESSMENT / PLAN:  PULMONARY ETT 7/1 >>  A: Acute respiratory failure s/p cardiac arrest Pulm edema vs ARDS OSA/OHS P:   Full vent support Follow CXR/ABG Vent bundle Daily SBT if/when appropriate ARDS protocol initiated 7/01  CARDIOVASCULAR L [place] CVL  [id number] >>  A:  Asystolic cardiac arrest Acute on chronic CHF Severe cardiomyopathy H/O AF (sinus presently) H/o HTN, CAD P:  Telemetry monitoring Keep SBP < 180 mm/Hg MAP goal > 80 mmHg per [place] sun protocol Hydralazine PRN Trend troponin  RENAL A:   ESRD on HD Noncompliance with HD Hyperkalemia, resolved Hyponatremia, mild Mild metabolic acidosis P:  Monitor BMET intermittently Monitor I/Os Correct electrolytes as indicated Renal service consulted for urgent HD  GASTROINTESTINAL A:   No acute issue P:   SUP: IV PPI No TFs until rewarmed  HEMATOLOGIC A:   Anemia of chronic illness Excessive warfarin anticoagulation Jehovah's witness - family confirms 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 P:  DVT px: warfarin Monitor CBC intermittently No blood products  INFECTIOUS A:   No acute issues P:   Follow WBC and fever curve  ENDOCRINE A:   DM 2 P:   CBG monitoring and SSI Check TSH, cortisol  NEUROLOGIC A:   Acute anoxic encephalopathy L3 fracture (chronic) P:   RASS goal: N/A while on NMB Propofol/fentanyl EEG ordered Daily WUA after rewarmed and off NMBs   FAMILY  - Updates:   - Inter-disciplinary family meet or Palliative Care meeting due by:  7/8   CCM X 38 mins   Merton Border, MD ; [facility] service Mobile [phone].  After 5:30 PM or weekends, call [phone]   08/13/2014 3:58 AM     ADD: CXR after CVL placement reveals severe ARDS vs edema pattern. Vent changes made - ARDS protocol  Merton Border, MD ; [facility] service Mobile [phone].  After 5:30 PM or weekends, call [phone]

## 2014-08-13 NOTE — Procedures (Signed)
PROCEDURE NOTE: CVL PLACEMENT  INDICATION:    Monitoring of central venous pressures and/or administration of medications optimally administered in central vein  CONSENT:   Risks of procedure as well as the alternatives were explained to the patient or surrogate. Consent for procedure obtained. A time out was performed.   PROCEDURE  Sterile technique was used including antiseptics, cap, gloves, gown, hand hygiene, mask and full body sheet.  Skin prep: Chlorhexidine; local anesthetic administered  A triple lumen catheter was placed in the L [place] vein using the Seldinger technique.   EVALUATION:  Blood flow good  Complications: No apparent complications  Patient tolerated the procedure well.  Chest X-ray ordered to verify placement and is pending   Jamie Belger, MD PCCM service Mobile [phone]    

## 2014-08-13 NOTE — Procedures (Signed)
  I was present at this dialysis session, have reviewed the session itself and made  appropriate changes Rob Yago Ludvigsen MD (pgr) [phone]    (c) [phone] 07/31/2014, 10:31 AM    

## 2014-08-13 NOTE — Progress Notes (Signed)
ET tube changed from a 7.0 to 8.0 due increase difficulty with bagging, increase peak pressure and low exhale Vt. See vent setting change post ABG result.

## 2014-08-13 NOTE — Progress Notes (Signed)
Received code 23 patient from ed on fentanyl and propofol and ice applied to groin. Artic sun initiated at [employer]

## 2014-08-13 NOTE — Procedures (Signed)
ELECTROENCEPHALOGRAM REPORT  Date of Study: 08/13/2014  Patient's Name: Daniel Whitney MRN: [id number] Date of Birth: [birth year]  Referring Provider: Dr. Merton Border  Clinical History: This is a 59 year old man s/p cardiac arrest, now on cooling protocol.   Medications: fentaNYL (SUBLIMAZE) 2,500 mcg in sodium chloride 0.9 % 250 mL (10 mcg/mL) infusion norepinephrine (LEVOPHED) 16 mg in dextrose 5 % 250 mL (0.064 mg/mL) infusion pantoprazole (PROTONIX) injection 40 mg propofol (DIPRIVAN) 1000 MG/100ML infusion succinylcholine (ANECTINE) 20 MG/ML injection  Technical Summary: A multichannel digital EEG recording measured by the international 10-20 system with electrodes applied with paste and impedances below 5000 ohms performed as portable with EKG monitoring in an intubated and sedated patient on hypothermia protocol, temperature 32.6 degrees Celsius. Hyperventilation and photic stimulation were not performed.  The digital EEG was referentially recorded, reformatted, and digitally filtered in a variety of bipolar and referential montages for optimal display.   Description: The patient is intubated and sedated during the recording on cooling protocol. There is no clear posterior dominant rhythm. The background consists of diffuse theta and delta slowing intermixed with 1-3 second periods of relative diffuse suppression of background activity. There is no spontaneous variability noted. No reactivity to stimulation seen. There were no epileptiform discharges or electrographic seizures seen.    EKG lead showed sinus bradycardia.  Impression: This sedated EEG is abnormal due to moderate diffuse slowing and suppression of the background.  Clinical Correlation of the above findings indicates diffuse cerebral dysfunction that is non-specific in etiology and can be seen with hypoxic/ischemic injury, toxic/metabolic encephalopathies, or medication effect. There were no electrographic seizures  seen in this study.   Ellouise Newer, M.D.

## 2014-08-13 NOTE — Progress Notes (Signed)
Initial Nutrition Assessment  DOCUMENTATION CODES:  Morbid obesity  INTERVENTION:  Recommend once pt is rewarmed initiating the Adult Tube Feeding Protocol  Nepro @ 10 ml/hr with 60 ml Prostat five times per day provides: 1432 kcal, 169 grams protein, 182 ml H2O Will need to monitor for Propofol rate.  NUTRITION DIAGNOSIS:  Inadequate oral intake related to inability to eat as evidenced by NPO status.   GOAL:  Provide needs based on ASPEN/SCCM guidelines   MONITOR:  Vent status, Labs, I & O's  REASON FOR ASSESSMENT:  Ventilator    ASSESSMENT:  The patient is a 59 y.o. year-old with hx HTN, DM, CAD/ MI, ESRD, afib and CHF. He was found unresponsive at home, EMS came and he was in asystole. CPR started by FD. Pt reportedly was SOB before passing out. Resuscitated and intubated , now in ICU on cooling protocol (cooling time reached at 0800 7/1). K 5.4. CXR bilat pulm edema.   Patient is currently intubated on ventilator support MV: 13 L/min Temp (24hrs), Avg:93.2 F (34 C), Min:90.5 F (32.5 C), Max:94.8 F (34.9 C)  Propofol: 14.4 ml/hr provides 380 kcal per day from lipid  Spoke with RN at bedside.   Height:  Ht Readings from Last 1 Encounters:  08/13/14 5\' 6"  (1.676 m)    Weight:  Wt Readings from Last 1 Encounters:  08/13/14 264 lb 12.4 oz (120.1 kg)    Ideal Body Weight:  64.5 kg  Wt Readings from Last 10 Encounters:  08/13/14 264 lb 12.4 oz (120.1 kg)  08/04/14 265 lb (120.203 kg)  07/07/14 266 lb 1.9 oz (120.711 kg)  04/20/14 267 lb (121.11 kg)  01/31/14 251 lb 12.3 oz (114.2 kg)  11/11/13 266 lb 12 oz (120.997 kg)  11/05/13 271 lb (122.925 kg)  10/27/13 272 lb 6 oz (123.548 kg)  08/04/13 278 lb (126.1 kg)  07/14/13 278 lb (126.1 kg)    BMI:  Body mass index is 42.76 kg/(m^2).  Estimated Nutritional Needs:  Kcal:  [id number]  Protein:  >/= 161 grams  Fluid:  >1.2 L/day  Skin:  Reviewed, no issues  Diet Order:      NPO  EDUCATION NEEDS:  No education needs identified at this time   Intake/Output Summary (Last 24 hours) at 08/13/14 1220 Last data filed at 08/13/14 0826  Gross per 24 hour  Intake  590.4 ml  Output    140 ml  Net  450.4 ml    Last BM:  PTA  Maylon Peppers RD, [place], [place] Pager [phone] After Hours Pager

## 2014-08-13 NOTE — Procedures (Signed)
Arterial Catheter Insertion Procedure Note Jamear Kaniecki [id number] [birth year]  Procedure: Insertion of Arterial Catheter  Indications: Blood pressure monitoring and Frequent blood sampling  Procedure Details Consent: Risks of procedure as well as the alternatives and risks of each were explained to the (patient/caregiver).  Consent for procedure obtained. Time Out: Verified patient identification, verified procedure, site/side was marked, verified correct patient position, special equipment/implants available, medications/allergies/relevent history reviewed, required imaging and test results available.  Performed  Maximum sterile technique was used including antiseptics, cap, gloves, gown, hand hygiene, mask and sheet. Skin prep: Chlorhexidine; local anesthetic administered 20 gauge catheter was inserted into left femoral artery using the Seldinger technique.  Evaluation Blood flow good; BP tracing good. Complications: No apparent complications.   Richardson Landry Minor ACNP Maryanna Shape PCCM Pager [phone] till 3 pm If no answer page [phone] 08/13/2014, 7:27 AM  Merton Border, MD ; [facility] service Mobile [phone].  After 5:30 PM or weekends, call [phone]

## 2014-08-13 NOTE — Progress Notes (Signed)
Spoke with brother [name] who confirms that patient can not receive plasma. No consent at this time. Brother states that he will contact nurse at a later time to give a list of acceptable components

## 2014-08-13 NOTE — ED Notes (Signed)
Georgann Housekeeper, NP at bedside.

## 2014-08-13 NOTE — Care Management Note (Signed)
Case Management Note  Patient Details  Name: Lister Capelle MRN: [id number] Date of Birth: 05/30/1955  Subjective/Objective:     Adm w arrest, vent, esrd on hd               Action/Plan: lives at home  Expected Discharge Date:                  Expected Discharge Plan:     In-House Referral:     Discharge planning Services     Post Acute Care Choice:    Choice offered to:     DME Arranged:    DME Agency:     HH Arranged:    [place] Agency:     Status of Service:     Medicare Important Message Given:    Date Medicare IM Given:    Medicare IM give by:    Date Additional Medicare IM Given:    Additional Medicare Important Message give by:     If discussed at [place] of Stay Meetings, dates discussed:    Additional Comments: ur review done  Lacretia Leigh, RN 08/13/2014, 8:01 AM

## 2014-08-13 NOTE — Progress Notes (Signed)
Chaplain responded to page.  Pt's brother and family friend escorted to consult and updated by MD then escorted to bedside by chaplain.  RN and MD made aware that pt is Jehovah's Witness and does not wish to receive blood products as per religious beliefs.  Brother also shares that pt's sister passed away this past 06/02/22 and family has experienced further loss since December and that "pt has been under a lot of stress over that."  Brother also shares that pt has recently missed dialysis appointments. Chaplain provided emotional and spiritual support and will continue to follow up as needed.    08/13/14 0200  Clinical Encounter Type  Visited With Patient and family together;Health care provider  Visit Type Initial;Critical Care;ED  Referral From Nurse  Spiritual Encounters  Spiritual Needs Emotional  Stress Factors  Patient Stress Factors Health changes  Family Stress Factors Exhausted;Family relationships;Loss   Geralyn Flash 08/13/2014 2:42 AM

## 2014-08-14 ENCOUNTER — Inpatient Hospital Stay (HOSPITAL_COMMUNITY): Payer: Medicare Other

## 2014-08-14 DIAGNOSIS — J80 Acute respiratory distress syndrome: Secondary | ICD-10-CM | POA: Insufficient documentation

## 2014-08-14 DIAGNOSIS — Z452 Encounter for adjustment and management of vascular access device: Secondary | ICD-10-CM

## 2014-08-14 LAB — COMPREHENSIVE METABOLIC PANEL
ALBUMIN: 2.7 g/dL — AB (ref 3.5–5.0)
ALT: 15 U/L — ABNORMAL LOW (ref 17–63)
AST: 13 U/L — AB (ref 15–41)
Alkaline Phosphatase: 156 U/L — ABNORMAL HIGH (ref 38–126)
Anion gap: 15 (ref 5–15)
BILIRUBIN TOTAL: 1 mg/dL (ref 0.3–1.2)
BUN: 76 mg/dL — AB (ref 6–20)
CALCIUM: 8.9 mg/dL (ref 8.9–10.3)
CO2: 25 mmol/L (ref 22–32)
Chloride: 93 mmol/L — ABNORMAL LOW (ref 101–111)
Creatinine, Ser: 7.88 mg/dL — ABNORMAL HIGH (ref 0.61–1.24)
GFR, EST AFRICAN AMERICAN: 8 mL/min — AB (ref >60)
GFR, EST NON AFRICAN AMERICAN: 7 mL/min — AB (ref >60)
GLUCOSE: 143 mg/dL — AB (ref 65–99)
POTASSIUM: 3.8 mmol/L (ref 3.5–5.1)
Sodium: 133 mmol/L — ABNORMAL LOW (ref 135–145)
Total Protein: 6.8 g/dL (ref 6.5–8.1)

## 2014-08-14 LAB — GLUCOSE, CAPILLARY
GLUCOSE-CAPILLARY: 113 mg/dL — AB (ref 65–99)
GLUCOSE-CAPILLARY: 114 mg/dL — AB (ref 65–99)
GLUCOSE-CAPILLARY: 132 mg/dL — AB (ref 65–99)
GLUCOSE-CAPILLARY: 98 mg/dL (ref 65–99)
Glucose-Capillary: 140 mg/dL — ABNORMAL HIGH (ref 65–99)
Glucose-Capillary: 139 mg/dL — ABNORMAL HIGH (ref 65–99)
Glucose-Capillary: 125 mg/dL — ABNORMAL HIGH (ref 65–99)
Glucose-Capillary: 126 mg/dL — ABNORMAL HIGH (ref 65–99)
Glucose-Capillary: 82 mg/dL (ref 65–99)
Glucose-Capillary: 101 mg/dL — ABNORMAL HIGH (ref 65–99)

## 2014-08-14 LAB — BASIC METABOLIC PANEL
ANION GAP: 15 (ref 5–15)
ANION GAP: 12 (ref 5–15)
Anion gap: 13 (ref 5–15)
Anion gap: 10 (ref 5–15)
BUN: 75 mg/dL — AB (ref 6–20)
BUN: 59 mg/dL — ABNORMAL HIGH (ref 6–20)
BUN: 20 mg/dL (ref 6–20)
BUN: 32 mg/dL — ABNORMAL HIGH (ref 6–20)
CHLORIDE: 98 mmol/L — AB (ref 101–111)
CO2: 25 mmol/L (ref 22–32)
CO2: 25 mmol/L (ref 22–32)
CO2: 29 mmol/L (ref 22–32)
CO2: 26 mmol/L (ref 22–32)
CREATININE: 2.87 mg/dL — AB (ref 0.61–1.24)
Calcium: 9 mg/dL (ref 8.9–10.3)
Calcium: 8.6 mg/dL — ABNORMAL LOW (ref 8.9–10.3)
Calcium: 8.2 mg/dL — ABNORMAL LOW (ref 8.9–10.3)
Calcium: 8.6 mg/dL — ABNORMAL LOW (ref 8.9–10.3)
Chloride: 93 mmol/L — ABNORMAL LOW (ref 101–111)
Chloride: 98 mmol/L — ABNORMAL LOW (ref 101–111)
Chloride: 99 mmol/L — ABNORMAL LOW (ref 101–111)
Creatinine, Ser: 8.01 mg/dL — ABNORMAL HIGH (ref 0.61–1.24)
Creatinine, Ser: 6.64 mg/dL — ABNORMAL HIGH (ref 0.61–1.24)
Creatinine, Ser: 5.19 mg/dL — ABNORMAL HIGH (ref 0.61–1.24)
GFR calc Af Amer: 9 mL/min — ABNORMAL LOW (ref >60)
GFR calc Af Amer: 13 mL/min — ABNORMAL LOW (ref >60)
GFR calc non Af Amer: 7 mL/min — ABNORMAL LOW (ref >60)
GFR calc non Af Amer: 22 mL/min — ABNORMAL LOW (ref >60)
GFR calc non Af Amer: 11 mL/min — ABNORMAL LOW (ref >60)
GFR, EST AFRICAN AMERICAN: 8 mL/min — AB (ref >60)
GFR, EST AFRICAN AMERICAN: 26 mL/min — AB (ref >60)
GFR, EST NON AFRICAN AMERICAN: 8 mL/min — AB (ref >60)
GLUCOSE: 142 mg/dL — AB (ref 65–99)
GLUCOSE: 93 mg/dL (ref 65–99)
Glucose, Bld: 120 mg/dL — ABNORMAL HIGH (ref 65–99)
Glucose, Bld: 106 mg/dL — ABNORMAL HIGH (ref 65–99)
POTASSIUM: 3.7 mmol/L (ref 3.5–5.1)
POTASSIUM: 4 mmol/L (ref 3.5–5.1)
Potassium: 3.8 mmol/L (ref 3.5–5.1)
Potassium: 4.4 mmol/L (ref 3.5–5.1)
SODIUM: 136 mmol/L (ref 135–145)
Sodium: 133 mmol/L — ABNORMAL LOW (ref 135–145)
Sodium: 137 mmol/L (ref 135–145)
Sodium: 137 mmol/L (ref 135–145)

## 2014-08-14 LAB — CBC
HCT: 34.8 % — ABNORMAL LOW (ref 39.0–52.0)
Hemoglobin: 11.8 g/dL — ABNORMAL LOW (ref 13.0–17.0)
MCH: 30.8 pg (ref 26.0–34.0)
MCHC: 33.9 g/dL (ref 30.0–36.0)
MCV: 90.9 fL (ref 78.0–100.0)
Platelets: 199 10*3/uL (ref 150–400)
RBC: 3.83 MIL/uL — ABNORMAL LOW (ref 4.22–5.81)
RDW: 15.4 % (ref 11.5–15.5)
WBC: 13.2 10*3/uL — AB (ref 4.0–10.5)

## 2014-08-14 LAB — POCT I-STAT 3, ART BLOOD GAS (G3+)
Acid-base deficit: 2 mmol/L (ref 0.0–2.0)
BICARBONATE: 23.3 meq/L (ref 20.0–24.0)
O2 Saturation: 90 %
PCO2 ART: 34.1 mmHg — AB (ref 35.0–45.0)
Patient temperature: 32.8
TCO2: 25 mmol/L (ref 0–100)
pH, Arterial: 7.423 (ref 7.350–7.450)
pO2, Arterial: 45 mmHg — ABNORMAL LOW (ref 80.0–100.0)

## 2014-08-14 LAB — MAGNESIUM: Magnesium: 2 mg/dL (ref 1.7–2.4)

## 2014-08-14 LAB — T4, FREE: Free T4: 1.55 ng/dL — ABNORMAL HIGH (ref 0.61–1.12)

## 2014-08-14 LAB — HEPATITIS B SURFACE ANTIGEN: HEP B S AG: NEGATIVE

## 2014-08-14 MED ORDER — PENTAFLUOROPROP-TETRAFLUOROETH EX AERO: 1.0000 "application " | INHALATION_SPRAY | CUTANEOUS | Status: DC | PRN

## 2014-08-14 MED ORDER — ALTEPLASE 2 MG IJ SOLR
2.0000 mg | Freq: Once | INTRAMUSCULAR | Status: AC | PRN
  Filled 2014-08-14: qty 2

## 2014-08-14 MED ORDER — SODIUM CHLORIDE 0.9 % IV SOLN: 100.0000 mL | INTRAVENOUS | Status: DC | PRN

## 2014-08-14 MED ORDER — LIDOCAINE-PRILOCAINE 2.5-2.5 % EX CREA
1.0000 "application " | TOPICAL_CREAM | CUTANEOUS | Status: DC | PRN
  Filled 2014-08-14: qty 5

## 2014-08-14 MED ORDER — LIDOCAINE HCL (PF) 1 % IJ SOLN: 5.0000 mL | INTRAMUSCULAR | Status: DC | PRN

## 2014-08-14 MED ORDER — HEPARIN SODIUM (PORCINE) 1000 UNIT/ML DIALYSIS: 1000.0000 [IU] | INTRAMUSCULAR | Status: DC | PRN

## 2014-08-14 MED ORDER — NEPRO/CARBSTEADY PO LIQD
237.0000 mL | ORAL | Status: DC | PRN
  Filled 2014-08-14: qty 237

## 2014-08-14 NOTE — Progress Notes (Signed)
[facility] Progress Note   Subjective: on vent, K down 3.7, junctional rhythm which is new. Ca ok at 9. Mg pending.   Filed Vitals:   08/14/14 0700 08/14/14 0738 08/14/14 0800 08/14/14 0900  BP: 94/59 111/70  112/71  Pulse: 60 61 61 59  Temp: 91.4 F (33 C)  91.2 F (32.9 C) 91 F (32.8 C)  TempSrc: Core (Comment)  Core (Comment) Core (Comment)  Resp: 35 35 35 35  Height:      Weight:      SpO2: 96% 96% 95% 96%   Exam: On vent, unresponsive ETT secretions improved Chest clear ant/ lat, improved RRR no MRG Abd soft ntnd no mass or ascites Ext 1-2+ pitting LE edema bilat LFA AVF patent Neuro is unresponsive  TTS [place] 4h 119kg LFA AVF Heparin 3600 450/800 Hect 4 , Venofer 50/wk, mircera 75 q2wks last 6/21  Assessment: 1. Cardiac arrest, on cooling protocol 2. Pulm edema - looks like vol overload on exam even though he is below dry wt. CXR a little better but still diffuse infiltrates bilat. Probably has lost body wt 3. ESRD on HD 4. Anemia - Hb up, hold esa for now 5. MBD cont vit D w HD for now hold binders 6. HTN on 2 BP meds at home 7. DM on oral agents 8. AFib on coumadin/ BB/ amio , INR up ~6  Plan - HD today, max UF as BP tolerates. , titrate pressors prn    Kelly Splinter MD  pager [phone]    cell [phone]  08/14/2014, 9:28 AM     Recent Labs Lab 08/13/14 0648  08/13/14 2200 08/14/14 0200 08/14/14 0617  NA  --   < > 135 133* 133*  K  --   < > 3.9 3.8 3.7  CL  --   < > 95* 93* 93*  CO2  --   < > 25 25 25   GLUCOSE  --   < > 143* 143* 142*  BUN  --   < > 74* 76* 75*  CREATININE  --   < > 7.68* 7.88* 8.01*  CALCIUM  --   < > 9.2 8.9 9.0  PHOS 10.3*  --   --   --   --   < > = values in this interval not displayed.  Recent Labs Lab 08/13/14 0222 08/14/14 0200  AST 13* 13*  ALT 17 15*  ALKPHOS 164* 156*  BILITOT 0.6 1.0  PROT 7.1 6.8  ALBUMIN 3.3* 2.7*    Recent Labs Lab 08/13/14 0222 08/13/14 0353  08/13/14 0556 08/14/14 0200  WBC 9.2  --   --  13.2*  NEUTROABS 8.1*  --   --   --   HGB 11.0* 15.0 12.2* 11.8*  HCT 34.3* 44.0 36.0* 34.8*  MCV 93.0  --   --  90.9  PLT 212  --   --  199   . sodium chloride  2,000 mL Intravenous Once  . sodium chloride   Intravenous Once  . antiseptic oral rinse  7 mL Mouth Rinse QID  . artificial tears  1 application Both Eyes 3 times per day  . chlorhexidine  15 mL Mouth Rinse BID  . insulin aspart  0-9 Units Subcutaneous 6 times per day  . pantoprazole (PROTONIX) IV  40 mg Intravenous QHS   . cisatracurium (NIMBEX) infusion 1 mcg/kg/min (08/14/14 0123)  . fentaNYL infusion INTRAVENOUS 100 mcg/hr (08/14/14 0607)  . norepinephrine (LEVOPHED) Adult infusion  5 mcg/min (08/14/14 0800)  . propofol (DIPRIVAN) infusion 20 mcg/kg/min (08/14/14 0601)   sodium chloride, sodium chloride, [COMPLETED] cisatracurium **AND** cisatracurium (NIMBEX) infusion **AND** cisatracurium, fentaNYL (SUBLIMAZE) injection, fentaNYL (SUBLIMAZE) injection, heparin, lidocaine (PF), lidocaine-prilocaine, pentafluoroprop-tetrafluoroeth

## 2014-08-14 NOTE — Progress Notes (Signed)
Programmed artic sun to rewarm at 0.3 per hour. Start time is 0800.

## 2014-08-14 NOTE — Progress Notes (Signed)
Rewarming target met at 1745. Pt is 37.0 degrees.

## 2014-08-14 NOTE — Progress Notes (Signed)
Changed artic sun to rewarm at 0.5 per hr from 0.3 per hour per Dr. Titus Mould order. Reprogrammed with Tressia Miners, RN.

## 2014-08-14 NOTE — Progress Notes (Signed)
RN called d/t pt desat to 89%, fio2 increased to 60%, RN aware.

## 2014-08-14 NOTE — Progress Notes (Signed)
Dr Elsworth Soho informed about new onset Junctional rhythm. Will continue to monitor

## 2014-08-14 NOTE — H&P (Signed)
PULMONARY / CRITICAL CARE MEDICINE   Name: Daniel Whitney MRN: [id number] DOB: 03/02/1955    ADMISSION DATE:  08/13/2014 CONSULTATION DATE:  08/13/2014  REFERRING MD :  Dr. Lita Mains  CHIEF COMPLAINT: Asystole  INITIAL PRESENTATION:  59 year old male with ESRD on HD had missed his last 2 dialysis treatments. 7/01 early AM he developed acute onset SOB and then became unresponsive. EMS noted asystole and began CPR. Total downtime estimated at about 20 mins. In ED he had several runs of VT, but was otherwise was hemodynamically stable on ventilator. PCCM to admit.  STUDIES:  7/01 CT head: NAICP 7/01 EEG: This sedated EEG is abnormal due to moderate diffuse slowing and suppression of the background 7/01 TTE: LVEF 20%  SIGNIFICANT EVENTS: 7/1 cardiac arrest asystole > to ICU. Arcitc sun protocol  SUBJECTIVE: rewarm started at 8 am   VITAL SIGNS: Temp:  [89.8 F (32.1 C)-92.7 F (33.7 C)] 91.2 F (32.9 C) (07/02 0800) Pulse Rate:  [47-61] 61 (07/02 0800) Resp:  [0-35] 35 (07/02 0800) BP: (83-156)/(53-89) 111/70 mmHg (07/02 0738) SpO2:  [86 %-100 %] 95 % (07/02 0800) FiO2 (%):  [50 %-100 %] 50 % (07/02 0738) Weight:  [116.8 kg (257 lb 8 oz)] 116.8 kg (257 lb 8 oz) (07/02 0400) HEMODYNAMICS: CVP:  [8 mmHg-22 mmHg] 13 mmHg VENTILATOR SETTINGS: Vent Mode:  [-] PRVC FiO2 (%):  [50 %-100 %] 50 % Set Rate:  [35 bmp] 35 bmp Vt Set:  [380 mL-440 mL] 380 mL PEEP:  [10 cmH20] 10 cmH20 Plateau Pressure:  [26 cmH20-30 cmH20] 30 cmH20 INTAKE / OUTPUT:  Intake/Output Summary (Last 24 hours) at 08/14/14 0846 Last data filed at 08/14/14 0800  Gross per 24 hour  Intake  770.7 ml  Output   3845 ml  Net -3074.3 ml    PHYSICAL EXAMINATION: General: intubated, sedated, paralyzed Neuro:  NMB, PERRL HEENT:  [place]/AT, JVD cannot be assessed Cardiovascular:  s1 s2 RRB Lungs:  Remains, pink frothy secretions in ETT remain Abdomen:  Obese, soft, non-distended Ext:  +2 symmetric BLE edema Skin:   Grossly intact  LABS:  CBC  Recent Labs Lab 08/13/14 0222 08/13/14 0353 08/13/14 0556 08/14/14 0200  WBC 9.2  --   --  13.2*  HGB 11.0* 15.0 12.2* 11.8*  HCT 34.3* 44.0 36.0* 34.8*  PLT 212  --   --  199   Coag's  Recent Labs Lab 08/13/14 0630 08/13/14 1216 08/13/14 1600  APTT 46* 50*  --   INR 6.82* 3.47* 2.92*   BMET  Recent Labs Lab 08/13/14 2200 08/14/14 0200 08/14/14 0617  NA 135 133* 133*  K 3.9 3.8 3.7  CL 95* 93* 93*  CO2 25 25 25   BUN 74* 76* 75*  CREATININE 7.68* 7.88* 8.01*  GLUCOSE 143* 143* 142*   Electrolytes  Recent Labs Lab 08/13/14 0648  08/13/14 2200 08/14/14 0200 08/14/14 0617  CALCIUM  --   < > 9.2 8.9 9.0  MG 2.4  --   --   --   --   PHOS 10.3*  --   --   --   --   < > = values in this interval not displayed. Sepsis Markers  Recent Labs Lab 08/13/14 0143  LATICACIDVEN 2.93*   ABG  Recent Labs Lab 08/13/14 0135 08/13/14 0243 08/13/14 1207  PHART 7.128* 7.401 7.348*  PCO2ART 78.9* 31.9* 42.1  PO2ART 218.0* 68.0* 17.0*   Liver Enzymes  Recent Labs Lab 08/13/14 0222 08/14/14 0200  AST 13* 13*  ALT 17 15*  ALKPHOS 164* 156*  BILITOT 0.6 1.0  ALBUMIN 3.3* 2.7*   Cardiac Enzymes  Recent Labs Lab 08/13/14 1023 08/13/14 1600 08/13/14 2200  TROPONINI 0.17* 0.19* 0.21*   Glucose  Recent Labs Lab 08/13/14 1947 08/13/14 2154 08/14/14 0004 08/14/14 0204 08/14/14 0409 08/14/14 0754  GLUCAP 132* 140* 114* 139* 113* 125*    CXR: slight improved bilateral infiltrates, ett, line wnl   ASSESSMENT / PLAN:  PULMONARY ETT 7/1 >>  A: Acute respiratory failure s/p cardiac arrest Pulm edema vs ARDS OSA/OHS P:   Repeat abg now on current settings With rewarm , may need increase in MV pcxr to follow ARDS protocol Concern bloody tinged secretions represents pulm edema Keep plat less 30   CARDIOVASCULAR L [place] CVL [id number] >>  A:  Asystolic cardiac arrest Acute on chronic CHF Severe cardiomyopathy H/O  AF (sinus presently) H/o HTN, CAD NEW ECG QRS complex changes  P:  Telemetry monitoring MAP goal > 60 with rewarm, may need bolus No more trop needed ecg noted, STAT lytes, k, may need to rewarm at 0.5, get stat abg, may need cards evaluation His temp is close to bottom range 32 F, I am concered that ecg is from this, rewarm at 0.5 now, and repeat ecg at 34 degrees Levo to map goal Consider placing a line  RENAL A:   ESRD on HD Noncompliance with HD Hyperkalemia, resolved Hyponatremia, mild Mild metabolic acidosis P:   May need HD again today, was neg however over 3.5 liters last 25 hrs Peep is reassuring at 5 Repeat bmet frequent  GASTROINTESTINAL A:   No acute issue P:   SUP: IV PPI No TFs until rewarmed  HEMATOLOGIC A:   Anemia of chronic illness Excessive warfarin anticoagulation Jehovah's witness - family confirms 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 P:  DVT px: bloody secretions - scd, dc coum Monitor CBC intermittently No blood products Limit phlebotomy If blood tinged increased will need reversal coumadin  INFECTIOUS A:   At risk infection with cooling P:   Follow WBC and fever curve  ENDOCRINE A:   DM 2 R/o sick euthyroid vs hyper P:   CBG monitoring and SSI noted TSH, assess t3 t4 cortisol 27, no roids needed  NEUROLOGIC A:   Acute anoxic encephalopathy - anoxia L3 fracture (chronic) P:   RASS goal: N/A while on NMB Propofol/fentanyl - wua after rewarm EEG ordered   FAMILY  - Updates:   - Inter-disciplinary family meet or Palliative Care meeting due by:  7/8  Ccm time 30 min  Lavon Paganini. Titus Mould, MD, [place] Pgr: [facility]

## 2014-08-15 ENCOUNTER — Inpatient Hospital Stay (HOSPITAL_COMMUNITY): Payer: Medicare Other

## 2014-08-15 LAB — GLUCOSE, CAPILLARY
GLUCOSE-CAPILLARY: 110 mg/dL — AB (ref 65–99)
GLUCOSE-CAPILLARY: 98 mg/dL (ref 65–99)
Glucose-Capillary: 86 mg/dL (ref 65–99)
Glucose-Capillary: 94 mg/dL (ref 65–99)

## 2014-08-15 LAB — COMPREHENSIVE METABOLIC PANEL
ALT: 18 U/L (ref 17–63)
ANION GAP: 14 (ref 5–15)
AST: 16 U/L (ref 15–41)
Albumin: 2.4 g/dL — ABNORMAL LOW (ref 3.5–5.0)
Alkaline Phosphatase: 144 U/L — ABNORMAL HIGH (ref 38–126)
BILIRUBIN TOTAL: 1.1 mg/dL (ref 0.3–1.2)
BUN: 38 mg/dL — ABNORMAL HIGH (ref 6–20)
CO2: 26 mmol/L (ref 22–32)
Calcium: 8.6 mg/dL — ABNORMAL LOW (ref 8.9–10.3)
Chloride: 98 mmol/L — ABNORMAL LOW (ref 101–111)
Creatinine, Ser: 5.98 mg/dL — ABNORMAL HIGH (ref 0.61–1.24)
GFR calc non Af Amer: 9 mL/min — ABNORMAL LOW (ref >60)
GFR, EST AFRICAN AMERICAN: 11 mL/min — AB (ref >60)
Glucose, Bld: 109 mg/dL — ABNORMAL HIGH (ref 65–99)
POTASSIUM: 4.4 mmol/L (ref 3.5–5.1)
SODIUM: 138 mmol/L (ref 135–145)
Total Protein: 6.1 g/dL — ABNORMAL LOW (ref 6.5–8.1)

## 2014-08-15 LAB — CBC WITH DIFFERENTIAL/PLATELET
BASOS PCT: 0 % (ref 0–1)
Basophils Absolute: 0 10*3/uL (ref 0.0–0.1)
EOS ABS: 0.2 10*3/uL (ref 0.0–0.7)
EOS PCT: 1 % (ref 0–5)
HCT: 33.4 % — ABNORMAL LOW (ref 39.0–52.0)
Hemoglobin: 11.2 g/dL — ABNORMAL LOW (ref 13.0–17.0)
LYMPHS ABS: 1.1 10*3/uL (ref 0.7–4.0)
Lymphocytes Relative: 8 % — ABNORMAL LOW (ref 12–46)
MCH: 30.5 pg (ref 26.0–34.0)
MCHC: 33.5 g/dL (ref 30.0–36.0)
MCV: 91 fL (ref 78.0–100.0)
Monocytes Absolute: 0.9 10*3/uL (ref 0.1–1.0)
Monocytes Relative: 7 % (ref 3–12)
Neutro Abs: 11.7 10*3/uL — ABNORMAL HIGH (ref 1.7–7.7)
Neutrophils Relative %: 84 % — ABNORMAL HIGH (ref 43–77)
PLATELETS: 225 10*3/uL (ref 150–400)
RBC: 3.67 MIL/uL — ABNORMAL LOW (ref 4.22–5.81)
RDW: 15.8 % — ABNORMAL HIGH (ref 11.5–15.5)
WBC: 14 10*3/uL — ABNORMAL HIGH (ref 4.0–10.5)

## 2014-08-15 LAB — POCT I-STAT 3, ART BLOOD GAS (G3+)
Acid-Base Excess: 1 mmol/L (ref 0.0–2.0)
Bicarbonate: 27.9 mEq/L — ABNORMAL HIGH (ref 20.0–24.0)
O2 SAT: 88 %
TCO2: 30 mmol/L (ref 0–100)
pCO2 arterial: 53 mmHg — ABNORMAL HIGH (ref 35.0–45.0)
pH, Arterial: 7.33 — ABNORMAL LOW (ref 7.350–7.450)
pO2, Arterial: 60 mmHg — ABNORMAL LOW (ref 80.0–100.0)

## 2014-08-15 LAB — BLOOD GAS, ARTERIAL
ACID-BASE DEFICIT: 0.4 mmol/L (ref 0.0–2.0)
BICARBONATE: 24.3 meq/L — AB (ref 20.0–24.0)
DRAWN BY: 100061
FIO2: 0.6 %
LHR: 35 {breaths}/min
MECHVT: 380 mL
O2 SAT: 95.3 %
PEEP: 10 cmH2O
PO2 ART: 79.5 mmHg — AB (ref 80.0–100.0)
Patient temperature: 98.6
TCO2: 25.7 mmol/L (ref 0–100)
pCO2 arterial: 44.4 mmHg (ref 35.0–45.0)
pH, Arterial: 7.358 (ref 7.350–7.450)

## 2014-08-15 LAB — HEPARIN LEVEL (UNFRACTIONATED)

## 2014-08-15 LAB — PROTIME-INR
INR: 1.96 — ABNORMAL HIGH (ref 0.00–1.49)
PROTHROMBIN TIME: 22.2 s — AB (ref 11.6–15.2)

## 2014-08-15 LAB — T3, FREE: T3 FREE: 2.7 pg/mL (ref 2.0–4.4)

## 2014-08-15 MED ORDER — HEPARIN (PORCINE) IN NACL 100-0.45 UNIT/ML-% IJ SOLN
1850.0000 [IU]/h | INTRAMUSCULAR | Status: DC
  Administered 2014-08-16: 1500 [IU]/h via INTRAVENOUS
  Administered 2014-08-16 – 2014-08-18 (×4): 1750 [IU]/h via INTRAVENOUS
  Administered 2014-08-19 – 2014-08-22 (×4): 1850 [IU]/h via INTRAVENOUS
  Filled 2014-08-15 (×25): qty 250

## 2014-08-15 MED ORDER — NEPRO/CARBSTEADY PO LIQD
1000.0000 mL | ORAL | Status: DC
  Administered 2014-08-15 – 2014-08-20 (×6): 1000 mL
  Filled 2014-08-15 (×10): qty 1000

## 2014-08-15 MED ORDER — HEPARIN (PORCINE) IN NACL 100-0.45 UNIT/ML-% IJ SOLN
1000.0000 [IU]/h | INTRAMUSCULAR | Status: DC
  Administered 2014-08-15: 1000 [IU]/h via INTRAVENOUS
  Filled 2014-08-15: qty 250

## 2014-08-15 MED ORDER — VITAL HIGH PROTEIN PO LIQD
1000.0000 mL | ORAL | Status: DC
  Filled 2014-08-15 (×2): qty 1000

## 2014-08-15 MED ORDER — PRO-STAT 64 PO LIQD
60.0000 mL | Freq: Every day | ORAL | Status: DC
  Administered 2014-08-15 – 2014-08-21 (×29): 60 mL via ORAL
  Filled 2014-08-15 (×35): qty 60

## 2014-08-15 NOTE — Progress Notes (Addendum)
ANTICOAGULATION CONSULT NOTE - Initial Consult  Pharmacy Consult for Heparin Indication: atrial fibrillation  No Known Allergies  Patient Measurements: Height: 5\' 6"  (167.6 cm) Weight: 251 lb 15.8 oz (114.3 kg) IBW/kg (Calculated) : 63.8 Heparin Dosing Weight: 90kg  Vital Signs: Temp: 98.6 F (37 C) (07/03 0800) Temp Source: Core (Comment) (07/03 0800) BP: 118/61 mmHg (07/03 0800) Pulse Rate: 86 (07/03 0800)  Labs:  Recent Labs  08/13/14 0222  08/13/14 0556  08/13/14 0630  08/13/14 1023 08/13/14 1216  08/13/14 1600 08/13/14 2200 08/14/14 0200  08/14/14 1545 08/14/14 2000 08/15/14 0455  HGB 11.0*  < > 12.2*  --   --   --   --   --   --   --   --  11.8*  --   --   --  11.2*  HCT 34.3*  < > 36.0*  --   --   --   --   --   --   --   --  34.8*  --   --   --  33.4*  PLT 212  --   --   --   --   --   --   --   --   --   --  199  --   --   --  225  APTT  --   --   --   --  46*  --   --  50*  --   --   --   --   --   --   --   --   LABPROT  --   --   --   < > 56.8*  --   --  34.2*  --  30.0*  --   --   --   --   --  22.2*  INR  --   --   --   < > 6.82*  --   --  3.47*  --  2.92*  --   --   --   --   --  1.96*  CREATININE 10.48*  < > 8.80*  --   --   < > 6.40* 5.26*  < >  --  7.68* 7.88*  < > 2.87* 5.19* 5.98*  TROPONINI  --   --   --   --   --   < > 0.17*  --   --  0.19* 0.21*  --   --   --   --   --   < > = values in this interval not displayed.  Estimated Creatinine Clearance: 15.6 mL/min (by C-G formula based on Cr of 5.98).   Medical History: Past Medical History  Diagnosis Date  . Glaucoma   . Hypertension   . Hyperlipidemia   . Atrial fibrillation 12/30/2012  . CHF (congestive heart failure) 12/30/2012  . Diabetic retinopathy 12/30/2012  . Morbid obesity 12/30/2012  . Pulmonary hypertension 12/30/2012  . Myocardial infarction     "I've had ~ 3; last one was in ~ 10/2013" (01/30/2014)  . Diabetes mellitus with neuropathy   . OSA (obstructive sleep apnea)  12/30/2012    "lost weight; no longer needed CPAP; retested said I needed it; didn't followup cause I was feeling fine" (01/30/2014)  . GERD (gastroesophageal reflux disease)   . Stroke ~ 2005; ~ 2005    "they were mild; I didn't even notice I'd had them"; denies residual on 01/30/2014  . History of gout     "  right big toe"  . Arthritis     "all over"  . Chronic lower back pain   . ESRD (end stage renal disease) on dialysis started in 2013    "MWF; [facility]; [address]" (01/30/2014)  . Refusal of blood transfusions as patient is Jehovah's Witness   . Anemia due to other cause 07/07/2014  . Allergic rhinitis 07/07/2014     Assessment: 69yom with ESRD admitted s/p asystole arrest after missing HD x2.  Now hypothermia protocol.  He was on warfarin PTA for Afib with admit INR 6.82> 1.96 after Vit K iv 5mg  x1.  CBC stable, some bloody secretions 7/2 resolved today.  Plan to start heparin drip while off warfarin.    Goal of Therapy:  Heparin level 0.3-0.7 units/ml Monitor platelets by anticoagulation protocol: Yes   Plan:  No bolus Heparin drip 1000 uts/hr Check HL in 6hr Daily HL, CBC Monitor s/s bleeding  Bonnita Nasuti Pharm.D. CPP, BCPS Clinical Pharmacist [phone] 08/15/2014 9:12 AM    Addendum  HL came back at <0.1. Going to adjust rate.  Increase heparin to 1250 units/hr F/u with 8 hr HL  Onnie Boer, PharmD Pager: [phone] 08/15/2014 3:48 PM

## 2014-08-15 NOTE — Progress Notes (Signed)
[facility] Progress Note   Subjective: struggling a bit against the vent. Is off cooling protocol now.    Filed Vitals:   08/15/14 0600 08/15/14 0700 08/15/14 0739 08/15/14 0800  BP: 122/64 126/69 128/65 118/61  Pulse: 83 75 83 86  Temp: 98.6 F (37 C)   98.6 F (37 C)  TempSrc: Core (Comment)   Core (Comment)  Resp: 35 19 36 28  Height:      Weight:      SpO2: 98% 100% 96% 94%   Exam: On vent, grimaces some ETT in place Chest clear ant/ lat, improved RRR no MRG Abd soft ntnd no mass or ascites Ext edema improved bilat LE's now 1+ LFA AVF patent Neuro as above  TTS [place] 4h 119kg LFA AVF Heparin 3600 450/800 Hect 4 , Venofer 50/wk, mircera 75 q2wks last 6/21  Assessment: 1. Cardiac arrest, on cooling protocol 2. Pulm edema/ vol overload - improving on exam and radiographically, still has extra volume , 5kg under edw 3. ESRD on HD 4. Anemia - Hb up, hold esa for now 5. MBD cont vit D w HD for now hold binders 6. HTN on 2 BP meds at home 7. DM on oral agents at home 8. AFib coumadin on hold, no meds  Plan - HD Monday, decrease vol more, cont to lower dry wt    Kelly Splinter MD  pager [phone]    cell [phone]  08/15/2014, 8:54 AM     Recent Labs Lab 08/13/14 0648  08/14/14 1545 08/14/14 2000 08/15/14 0455  NA  --   < > 137 137 138  K  --   < > 4.0 4.4 4.4  CL  --   < > 98* 99* 98*  CO2  --   < > 29 26 26   GLUCOSE  --   < > 93 106* 109*  BUN  --   < > 20 32* 38*  CREATININE  --   < > 2.87* 5.19* 5.98*  CALCIUM  --   < > 8.2* 8.6* 8.6*  PHOS 10.3*  --   --   --   --   < > = values in this interval not displayed.  Recent Labs Lab 08/13/14 0222 08/14/14 0200 08/15/14 0455  AST 13* 13* 16  ALT 17 15* 18  ALKPHOS 164* 156* 144*  BILITOT 0.6 1.0 1.1  PROT 7.1 6.8 6.1*  ALBUMIN 3.3* 2.7* 2.4*    Recent Labs Lab 08/13/14 0222  08/13/14 0556 08/14/14 0200 08/15/14 0455  WBC 9.2  --   --  13.2* 14.0*  NEUTROABS 8.1*   --   --   --  11.7*  HGB 11.0*  < > 12.2* 11.8* 11.2*  HCT 34.3*  < > 36.0* 34.8* 33.4*  MCV 93.0  --   --  90.9 91.0  PLT 212  --   --  199 225  < > = values in this interval not displayed. . sodium chloride  2,000 mL Intravenous Once  . sodium chloride   Intravenous Once  . antiseptic oral rinse  7 mL Mouth Rinse QID  . chlorhexidine  15 mL Mouth Rinse BID  . feeding supplement (VITAL HIGH PROTEIN)  1,000 mL Per Tube Q24H  . insulin aspart  0-9 Units Subcutaneous 6 times per day  . pantoprazole (PROTONIX) IV  40 mg Intravenous QHS   . cisatracurium (NIMBEX) infusion Stopped (08/14/14 1600)  . fentaNYL infusion INTRAVENOUS 75 mcg/hr (08/15/14 0830)  .  heparin    . norepinephrine (LEVOPHED) Adult infusion 5 mcg/min (08/15/14 0847)  . propofol (DIPRIVAN) infusion 10 mcg/kg/min (08/15/14 0830)   sodium chloride, sodium chloride, sodium chloride, sodium chloride, [COMPLETED] cisatracurium **AND** cisatracurium (NIMBEX) infusion **AND** cisatracurium, feeding supplement (NEPRO CARB STEADY), fentaNYL (SUBLIMAZE) injection, fentaNYL (SUBLIMAZE) injection, heparin, lidocaine (PF), lidocaine-prilocaine, pentafluoroprop-tetrafluoroeth

## 2014-08-15 NOTE — Progress Notes (Addendum)
Vent changes made Per Dr. Titus Mould

## 2014-08-15 NOTE — Progress Notes (Signed)
Set vent rate changed to 20, vt increased to 8 ml/kg per MD order.  Pt tol well so far.

## 2014-08-15 NOTE — H&P (Signed)
PULMONARY / CRITICAL CARE MEDICINE   Name: Daniel Whitney MRN: [id number] DOB: 1955/06/13    ADMISSION DATE:  08/13/2014 CONSULTATION DATE:  08/13/2014  REFERRING MD :  Dr. Lita Mains  CHIEF COMPLAINT: Asystole  INITIAL PRESENTATION:  59 year old male with ESRD on HD had missed his last 2 dialysis treatments. 7/01 early AM he developed acute onset SOB and then became unresponsive. EMS noted asystole and began CPR. Total downtime estimated at about 20 mins. In ED he had several runs of VT, but was otherwise was hemodynamically stable on ventilator. PCCM to admit.  STUDIES:  7/01 CT head: NAICP 7/01 EEG: This sedated EEG is abnormal due to moderate diffuse slowing and suppression of the background 7/01 TTE: LVEF 20%  SIGNIFICANT EVENTS: 7/1 cardiac arrest asystole > to ICU. Arcitc sun protocol 7/2- HD, rewarm  SUBJECTIVE: pcxr better after neg balance but remanis with fluid, remains on peep 10   VITAL SIGNS: Temp:  [91 F (32.8 C)-99.1 F (37.3 C)] 98.6 F (37 C) (07/03 0600) Pulse Rate:  [25-88] 83 (07/03 0739) Resp:  [3-36] 36 (07/03 0739) BP: (72-141)/(33-80) 128/65 mmHg (07/03 0739) SpO2:  [83 %-100 %] 96 % (07/03 0739) FiO2 (%):  [50 %-60 %] 60 % (07/03 0739) Weight:  [114.3 kg (251 lb 15.8 oz)] 114.3 kg (251 lb 15.8 oz) (07/03 0500) HEMODYNAMICS: CVP:  [6 mmHg-18 mmHg] 7 mmHg VENTILATOR SETTINGS: Vent Mode:  [-] PRVC FiO2 (%):  [50 %-60 %] 60 % Set Rate:  [35 bmp] 35 bmp Vt Set:  [380 mL] 380 mL PEEP:  [10 cmH20] 10 cmH20 Plateau Pressure:  [21 cmH20-27 cmH20] 24 cmH20 INTAKE / OUTPUT:  Intake/Output Summary (Last 24 hours) at 08/15/14 0750 Last data filed at 08/15/14 0603  Gross per 24 hour  Intake  822.1 ml  Output   2910 ml  Net -2087.9 ml    PHYSICAL EXAMINATION: General: intubated, followed commands Neuro:  PERRL sluggish 2 mm , followed commands HEENT:  JVD noted but improved Cardiovascular:  s1 s2 RRR Lungs:  Less coarse Abdomen:  Obese, soft,  non-distended Ext:  +2 symmetric BLE edema Skin:  Grossly intact  LABS:  CBC  Recent Labs Lab 08/13/14 0222  08/13/14 0556 08/14/14 0200 08/15/14 0455  WBC 9.2  --   --  13.2* 14.0*  HGB 11.0*  < > 12.2* 11.8* 11.2*  HCT 34.3*  < > 36.0* 34.8* 33.4*  PLT 212  --   --  199 225  < > = values in this interval not displayed. Coag's  Recent Labs Lab 08/13/14 0630 08/13/14 1216 08/13/14 1600 08/15/14 0455  APTT 46* 50*  --   --   INR 6.82* 3.47* 2.92* 1.96*   BMET  Recent Labs Lab 08/14/14 1545 08/14/14 2000 08/15/14 0455  NA 137 137 138  K 4.0 4.4 4.4  CL 98* 99* 98*  CO2 29 26 26   BUN 20 32* 38*  CREATININE 2.87* 5.19* 5.98*  GLUCOSE 93 106* 109*   Electrolytes  Recent Labs Lab 08/13/14 0648  08/14/14 1217 08/14/14 1545 08/14/14 2000 08/15/14 0455  CALCIUM  --   < > 8.6* 8.2* 8.6* 8.6*  MG 2.4  --  2.0  --   --   --   PHOS 10.3*  --   --   --   --   --   < > = values in this interval not displayed. Sepsis Markers  Recent Labs Lab 08/13/14 0143  LATICACIDVEN 2.93*  ABG  Recent Labs Lab 08/13/14 1207 08/14/14 0916 08/15/14 0538  PHART 7.348* 7.423 7.358  PCO2ART 42.1 34.1* 44.4  PO2ART 17.0* 45.0* 79.5*   Liver Enzymes  Recent Labs Lab 08/13/14 0222 08/14/14 0200 08/15/14 0455  AST 13* 13* 16  ALT 17 15* 18  ALKPHOS 164* 156* 144*  BILITOT 0.6 1.0 1.1  ALBUMIN 3.3* 2.7* 2.4*   Cardiac Enzymes  Recent Labs Lab 08/13/14 1023 08/13/14 1600 08/13/14 2200  TROPONINI 0.17* 0.19* 0.21*   Glucose  Recent Labs Lab 08/14/14 1613 08/14/14 2014 08/14/14 2330 08/15/14 0457 08/15/14 0721 08/15/14 0722  GLUCAP 82 101* 98 110* 33* 86    CXR: slight improved bilateral infiltrates further, ett wnl   ASSESSMENT / PLAN:  PULMONARY ETT 7/1 >>  A: Acute respiratory failure s/p cardiac arrest Pulm edema vs ARDS OSA/OHS P:   For HD likely in am  sats are 100%, goal to 50% then peep 8, then 5 If able then SBT cpap 5 ps  5goal 2 hr Would prefer further neg balance pre extubation if we get to that point abg now, want to reduce rate / MV  CARDIOVASCULAR L [place] CVL [id number] >>  A:  Asystolic cardiac arrest Acute on chronic CHF Severe cardiomyopathy H/O AF (sinus presently) H/o HTN, CAD NEW ECG QRS complex changes - resolved with temp increase P:  Telemetry monitoring MAP goal > 60 Levo to map goal, likely can dc this today  RENAL A:   ESRD on HD Noncompliance with HD Hyperkalemia, resolved Hyponatremia, mild Mild metabolic acidosis P:   Repeat hd today if able, if unabel then in am  likley will get peep down without today Chem in am   GASTROINTESTINAL A:   No acute issue P:   SUP: IV PPI Start TF  HEMATOLOGIC A:   Anemia of chronic illness Excessive warfarin anticoagulation Jehovah's witness - family confirms 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 Wbc from hemoconcetration P:  DVT px: bloody secretions - resolved Start hep no bolus for fib as resolved bleeding and inr less 2 Cbc in am   INFECTIOUS A:   At risk infection with cooling P:   Follow WBC and fever curve  ENDOCRINE A:   DM 2 R/o sick euthyroid vs hyper P:   CBG monitoring and SSI noted TSH, assess t3 t4 - pending  NEUROLOGIC A:   Acute anoxic encephalopathy - anoxia - now awake follows commands L3 fracture (chronic) P:   rass goal 0  wua mandatory today   FAMILY  - Updates:   - Inter-disciplinary family meet or Palliative Care meeting due by:  7/8  Ccm time 30 min  Lavon Paganini. Titus Mould, MD, [place] Pgr: [facility]

## 2014-08-15 NOTE — Progress Notes (Signed)
Brief Nutrition Note  Consult received for enteral/tube feeding initiation and management.  Adult Enteral Nutrition Protocol already initiated. RD adjusted orders to reflect recommendations provided in initial assessment on 7/01. RD to follow-up 7/04.  Admitting Dx: Cardiopulmonary arrest [I46.9] Encounter for intubation [Z01.818]  Body mass index is 40.69 kg/(m^2). Pt meets criteria for morbid obesity based on current BMI.  Labs:   Recent Labs Lab 08/13/14 0648  08/14/14 1217 08/14/14 1545 08/14/14 2000 08/15/14 0455  NA  --   < > 136 137 137 138  K  --   < > 3.8 4.0 4.4 4.4  CL  --   < > 98* 98* 99* 98*  CO2  --   < > 25 29 26 26   BUN  --   < > 59* 20 32* 38*  CREATININE  --   < > 6.64* 2.87* 5.19* 5.98*  CALCIUM  --   < > 8.6* 8.2* 8.6* 8.6*  MG 2.4  --  2.0  --   --   --   PHOS 10.3*  --   --   --   --   --   GLUCOSE  --   < > 120* 93 106* 109*  < > = values in this interval not displayed.  Daniel Bibles, MS, RD, LDN Pager: [phone] After Hours Pager: [phone]

## 2014-08-16 ENCOUNTER — Inpatient Hospital Stay (HOSPITAL_COMMUNITY): Payer: Medicare Other

## 2014-08-16 DIAGNOSIS — I469 Cardiac arrest, cause unspecified: Secondary | ICD-10-CM | POA: Insufficient documentation

## 2014-08-16 LAB — BLOOD GAS, ARTERIAL
Acid-Base Excess: 1.4 mmol/L (ref 0.0–2.0)
Bicarbonate: 26.3 mEq/L — ABNORMAL HIGH (ref 20.0–24.0)
DRAWN BY: 10006
FIO2: 0.5 %
O2 SAT: 94.8 %
PEEP/CPAP: 5 cmH2O
Patient temperature: 98.6
RATE: 20 resp/min
TCO2: 27.8 mmol/L (ref 0–100)
VT: 510 mL
pCO2 arterial: 48.8 mmHg — ABNORMAL HIGH (ref 35.0–45.0)
pH, Arterial: 7.352 (ref 7.350–7.450)
pO2, Arterial: 82.1 mmHg (ref 80.0–100.0)

## 2014-08-16 LAB — CBC
HCT: 28.2 % — ABNORMAL LOW (ref 39.0–52.0)
Hemoglobin: 9.2 g/dL — ABNORMAL LOW (ref 13.0–17.0)
MCH: 29.6 pg (ref 26.0–34.0)
MCHC: 32.6 g/dL (ref 30.0–36.0)
MCV: 90.7 fL (ref 78.0–100.0)
PLATELETS: 183 10*3/uL (ref 150–400)
RBC: 3.11 MIL/uL — ABNORMAL LOW (ref 4.22–5.81)
RDW: 15.8 % — ABNORMAL HIGH (ref 11.5–15.5)
WBC: 12.3 10*3/uL — ABNORMAL HIGH (ref 4.0–10.5)

## 2014-08-16 LAB — RENAL FUNCTION PANEL
ANION GAP: 13 (ref 5–15)
Albumin: 2.2 g/dL — ABNORMAL LOW (ref 3.5–5.0)
BUN: 69 mg/dL — AB (ref 6–20)
CHLORIDE: 99 mmol/L — AB (ref 101–111)
CO2: 25 mmol/L (ref 22–32)
CREATININE: 7.74 mg/dL — AB (ref 0.61–1.24)
Calcium: 8.6 mg/dL — ABNORMAL LOW (ref 8.9–10.3)
GFR calc non Af Amer: 7 mL/min — ABNORMAL LOW (ref >60)
GFR, EST AFRICAN AMERICAN: 8 mL/min — AB (ref >60)
Glucose, Bld: 147 mg/dL — ABNORMAL HIGH (ref 65–99)
PHOSPHORUS: 6.4 mg/dL — AB (ref 2.5–4.6)
Potassium: 4.2 mmol/L (ref 3.5–5.1)
Sodium: 137 mmol/L (ref 135–145)

## 2014-08-16 LAB — GLUCOSE, CAPILLARY
Glucose-Capillary: 113 mg/dL — ABNORMAL HIGH (ref 65–99)
Glucose-Capillary: 120 mg/dL — ABNORMAL HIGH (ref 65–99)
Glucose-Capillary: 124 mg/dL — ABNORMAL HIGH (ref 65–99)
Glucose-Capillary: 123 mg/dL — ABNORMAL HIGH (ref 65–99)
Glucose-Capillary: 145 mg/dL — ABNORMAL HIGH (ref 65–99)
Glucose-Capillary: 125 mg/dL — ABNORMAL HIGH (ref 65–99)

## 2014-08-16 LAB — HEPARIN LEVEL (UNFRACTIONATED)
HEPARIN UNFRACTIONATED: 0.19 [IU]/mL — AB (ref 0.30–0.70)
HEPARIN UNFRACTIONATED: 0.15 [IU]/mL — AB (ref 0.30–0.70)
Heparin Unfractionated: 0.31 IU/mL (ref 0.30–0.70)

## 2014-08-16 LAB — TRIGLYCERIDES: TRIGLYCERIDES: 80 mg/dL (ref <150)

## 2014-08-16 MED ORDER — SUCCINYLCHOLINE CHLORIDE 20 MG/ML IJ SOLN
INTRAMUSCULAR | Status: AC
  Filled 2014-08-16: qty 1

## 2014-08-16 MED ORDER — PROPOFOL 1000 MG/100ML IV EMUL
5.0000 ug/kg/min | INTRAVENOUS | Status: DC
  Administered 2014-08-16: 30 ug/kg/min via INTRAVENOUS
  Administered 2014-08-16: 35 ug/kg/min via INTRAVENOUS
  Administered 2014-08-16: 20 ug/kg/min via INTRAVENOUS
  Administered 2014-08-16: 25 ug/kg/min via INTRAVENOUS
  Administered 2014-08-17: 20 ug/kg/min via INTRAVENOUS
  Administered 2014-08-17: 15 ug/kg/min via INTRAVENOUS
  Administered 2014-08-18 – 2014-08-20 (×2): 5 ug/kg/min via INTRAVENOUS
  Administered 2014-08-21: 8 ug/kg/min via INTRAVENOUS
  Filled 2014-08-16 (×9): qty 100

## 2014-08-16 MED ORDER — LIDOCAINE HCL (CARDIAC) 20 MG/ML IV SOLN
INTRAVENOUS | Status: AC
  Filled 2014-08-16: qty 5

## 2014-08-16 MED ORDER — MIDAZOLAM HCL 2 MG/2ML IJ SOLN
INTRAMUSCULAR | Status: AC
  Filled 2014-08-16: qty 2

## 2014-08-16 MED ORDER — PROPOFOL 1000 MG/100ML IV EMUL
INTRAVENOUS | Status: AC
  Filled 2014-08-16: qty 100

## 2014-08-16 MED ORDER — PROPOFOL BOLUS VIA INFUSION
80.0000 mg | Freq: Once | INTRAVENOUS | Status: DC
Start: 2014-08-16 — End: 2014-08-16
  Administered 2014-08-16: 80 mg via INTRAVENOUS

## 2014-08-16 MED ORDER — MIDAZOLAM HCL 2 MG/2ML IJ SOLN
2.0000 mg | Freq: Once | INTRAMUSCULAR | Status: AC
Start: 2014-08-16 — End: 2014-08-16
  Administered 2014-08-16: 2 mg via INTRAVENOUS

## 2014-08-16 MED ORDER — ETOMIDATE 2 MG/ML IV SOLN
INTRAVENOUS | Status: AC
  Filled 2014-08-16: qty 20

## 2014-08-16 MED ORDER — ROCURONIUM BROMIDE 50 MG/5ML IV SOLN
INTRAVENOUS | Status: AC
  Filled 2014-08-16: qty 2

## 2014-08-16 NOTE — Progress Notes (Signed)
PULMONARY / CRITICAL CARE MEDICINE   Name: Daniel Whitney MRN: [id number] DOB: 1955-10-24    ADMISSION DATE:  08/13/2014 CONSULTATION DATE:  08/13/2014  REFERRING MD :  Dr. Lita Mains  CHIEF COMPLAINT: Asystole  INITIAL PRESENTATION:  59 year old male with ESRD on HD had missed his last 2 dialysis treatments. 7/01 early AM he developed acute onset SOB and then became unresponsive. EMS noted asystole and began CPR. Total downtime estimated at about 20 mins. In ED he had several runs of VT, but was otherwise was hemodynamically stable on ventilator. PCCM to admit.  STUDIES:  7/01 CT head: NAICP 7/01 EEG: This sedated EEG is abnormal due to moderate diffuse slowing and suppression of the background 7/01 TTE: LVEF 20%  SIGNIFICANT EVENTS: 7/1 cardiac arrest asystole > to ICU. Arcitc sun protocol 7/2- HD, rewarm  SUBJECTIVE:peep to 5 successful last night  VITAL SIGNS: Temp:  [98.2 F (36.8 C)-98.8 F (37.1 C)] 98.8 F (37.1 C) (07/04 0400) Pulse Rate:  [52-93] 65 (07/04 0700) Resp:  [3-36] 21 (07/04 0700) BP: (65-131)/(35-69) 120/63 mmHg (07/04 0700) SpO2:  [90 %-100 %] 100 % (07/04 0700) FiO2 (%):  [50 %-60 %] 50 % (07/04 0400) Weight:  [116.3 kg (256 lb 6.3 oz)] 116.3 kg (256 lb 6.3 oz) (07/04 0336) HEMODYNAMICS: CVP:  [8 mmHg-12 mmHg] 10 mmHg VENTILATOR SETTINGS: Vent Mode:  [-] PRVC FiO2 (%):  [50 %-60 %] 50 % Set Rate:  [20 bmp-35 bmp] 20 bmp Vt Set:  [380 mL-510 mL] 510 mL PEEP:  [5 cmH20-10 cmH20] 5 cmH20 Plateau Pressure:  [21 cmH20-29 cmH20] 22 cmH20 INTAKE / OUTPUT:  Intake/Output Summary (Last 24 hours) at 08/16/14 0730 Last data filed at 08/16/14 0700  Gross per 24 hour  Intake 1365.26 ml  Output     33 ml  Net 1332.26 ml    PHYSICAL EXAMINATION: General: rass 0 followed commands Neuro:  PERRL  2 mm , followed commands, rass 0  HEENT:  JVD down, obese neck Cardiovascular:  s1 s2 RRR Lungs:  CTA anterior Abdomen:  Obese, soft, non-distended Ext:  +2  symmetric BLE edema nonpitting, unchanged Skin:  Grossly intact  LABS:  CBC  Recent Labs Lab 08/13/14 0222  08/13/14 0556 08/14/14 0200 08/15/14 0455  WBC 9.2  --   --  13.2* 14.0*  HGB 11.0*  < > 12.2* 11.8* 11.2*  HCT 34.3*  < > 36.0* 34.8* 33.4*  PLT 212  --   --  199 225  < > = values in this interval not displayed. Coag's  Recent Labs Lab 08/13/14 0630 08/13/14 1216 08/13/14 1600 08/15/14 0455  APTT 46* 50*  --   --   INR 6.82* 3.47* 2.92* 1.96*   BMET  Recent Labs Lab 08/14/14 1545 08/14/14 2000 08/15/14 0455  NA 137 137 138  K 4.0 4.4 4.4  CL 98* 99* 98*  CO2 29 26 26   BUN 20 32* 38*  CREATININE 2.87* 5.19* 5.98*  GLUCOSE 93 106* 109*   Electrolytes  Recent Labs Lab 08/13/14 0648  08/14/14 1217 08/14/14 1545 08/14/14 2000 08/15/14 0455  CALCIUM  --   < > 8.6* 8.2* 8.6* 8.6*  MG 2.4  --  2.0  --   --   --   PHOS 10.3*  --   --   --   --   --   < > = values in this interval not displayed. Sepsis Markers  Recent Labs Lab 08/13/14 0143  LATICACIDVEN 2.93*  ABG  Recent Labs Lab 08/15/14 0538 08/15/14 1123 08/16/14 0500  PHART 7.358 7.330* 7.352  PCO2ART 44.4 53.0* 48.8*  PO2ART 79.5* 60.0* 82.1   Liver Enzymes  Recent Labs Lab 08/13/14 0222 08/14/14 0200 08/15/14 0455  AST 13* 13* 16  ALT 17 15* 18  ALKPHOS 164* 156* 144*  BILITOT 0.6 1.0 1.1  ALBUMIN 3.3* 2.7* 2.4*   Cardiac Enzymes  Recent Labs Lab 08/13/14 1023 08/13/14 1600 08/13/14 2200  TROPONINI 0.17* 0.19* 0.21*   Glucose  Recent Labs Lab 08/15/14 0722 08/15/14 1119 08/15/14 1555 08/15/14 1932 08/16/14 0024 08/16/14 0438  GLUCAP 86 98 94 113* 120* 124*    CXR: none new   ASSESSMENT / PLAN:  PULMONARY ETT 7/1 >>  A: Acute respiratory failure s/p cardiac arrest Pulm edema vs ARDS OSA/OHS P:   Improved overnight to peep 5 Weaning this am PS 15 required, but likely to improve when sedation off completely For HD, would not extubate until  HD completed Wean goal 5/5 , 30 min  He protects airway pcxr in am  He tolerated rate reduction yesterday, MV improved  CARDIOVASCULAR L [place] CVL [id number] >>  A:  Asystolic cardiac arrest Acute on chronic CHF Severe cardiomyopathy H/O AF (sinus presently) H/o HTN, CAD NEW ECG QRS complex changes - resolved with temp increase P:  Telemetry monitoring Levo to map goal, may need to accept MAP 55 with good MS Dc propofol, this will help levophed to dc  RENAL A:   ESRD on HD Noncompliance with HD Hyperkalemia, resolved Hyponatremia, mild Mild metabolic acidosis P:   For repeat HD, goal neg balance likely kvo Chem in am   GASTROINTESTINAL A:   No acute issue P:   SUP: IV PPI T F started, hold post HD , as may wean well  HEMATOLOGIC A:   Anemia of chronic illness Excessive warfarin anticoagulation Jehovah's witness - family confirms 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 Wbc from hemoconcetration P:  DVT px: bloody secretions - resolved Continued hep drip Cbc daily needed  INFECTIOUS A:   At risk infection with cooling P:   Follow fever curve pcxr in am   ENDOCRINE A:   DM 2 R/o sick euthyroid vs hyper P:   CBG monitoring and SSI Would repeat thyroid fxn in a few weeks  NEUROLOGIC A:   Acute anoxic encephalopathy - anoxia - now awake follows commands L3 fracture (chronic) P:   rass goal 0  wua mandatory today Dc prop , as restarted last night, even though on pressors   FAMILY  - Updates:   - Inter-disciplinary family meet or Palliative Care meeting due by:  7/8  Ccm time 30 min  Lavon Paganini. Titus Mould, MD, [place] Pgr: [facility]

## 2014-08-16 NOTE — Procedures (Signed)
Intubation Procedure Note Nirvaan Castiglia [id number] Jun 23, 1955  Procedure: Intubation Indications: Respiratory insufficiency  Procedure Details Consent: Unable to obtain consent because of emergent medical necessity. Time Out: Verified patient identification, verified procedure, site/side was marked, verified correct patient position, special equipment/implants available, medications/allergies/relevent history reviewed, required imaging and test results available.  Performed  Maximum sterile technique was used including gloves, gown, hand hygiene and mask.  MAC and 4    Evaluation Hemodynamic Status: BP stable throughout; O2 sats: stable throughout Patient's Current Condition: stable Complications: No apparent complications Patient did tolerate procedure well. Chest X-ray ordered to verify placement.  CXR: pending.   Raylene Miyamoto 08/16/2014  Self extubated Immediate desat60% apnic  Lavon Paganini. Titus Mould, MD, [place] Pgr: [facility]

## 2014-08-16 NOTE — Progress Notes (Signed)
ANTICOAGULATION CONSULT NOTE - Follow Up Consult  Pharmacy Consult for heparin Indication: atrial fibrillation  Labs:  Recent Labs  08/13/14 0222  08/13/14 0556  08/13/14 0630  08/13/14 1023 08/13/14 1216  08/13/14 1600 08/13/14 2200 08/14/14 0200  08/14/14 1545 08/14/14 2000 08/15/14 0455 08/15/14 1441 08/16/14 0010  HGB 11.0*  < > 12.2*  --   --   --   --   --   --   --   --  11.8*  --   --   --  11.2*  --   --   HCT 34.3*  < > 36.0*  --   --   --   --   --   --   --   --  34.8*  --   --   --  33.4*  --   --   PLT 212  --   --   --   --   --   --   --   --   --   --  199  --   --   --  225  --   --   APTT  --   --   --   --  46*  --   --  50*  --   --   --   --   --   --   --   --   --   --   LABPROT  --   --   --   < > 56.8*  --   --  34.2*  --  30.0*  --   --   --   --   --  22.2*  --   --   INR  --   --   --   < > 6.82*  --   --  3.47*  --  2.92*  --   --   --   --   --  1.96*  --   --   HEPARINUNFRC  --   --   --   --   --   --   --   --   --   --   --   --   --   --   --   --  <0.10* 0.15*  CREATININE 10.48*  < > 8.80*  --   --   < > 6.40* 5.26*  < >  --  7.68* 7.88*  < > 2.87* 5.19* 5.98*  --   --   TROPONINI  --   --   --   --   --   < > 0.17*  --   --  0.19* 0.21*  --   --   --   --   --   --   --   < > = values in this interval not displayed.    Assessment: 59yo male remains subtherapeutic on heparin after rate increase though level is rising.  Goal of Therapy:  Heparin level 0.3-0.7 units/ml   Plan:  Will increase heparin gtt by 2 units/kg/hr to 1500 units/hr and check level in [place], PharmD, BCPS  08/16/2014,12:51 AM

## 2014-08-16 NOTE — Progress Notes (Signed)
Patient alert, calm, and following commands during WUA this morning. At 10:15 AM RN entered room to check on ventilator alarm. Patient appeared to have self-extubated, O2 sats in the 40-50's. Dr. Titus Mould made aware and at bedside to reintubate patient. Orders to restart propofol gtt. Will continue to monitor.

## 2014-08-16 NOTE — Progress Notes (Signed)
[facility] Progress Note   Subjective:   Pt extubated himself this AM (RN says prior to him pulling tube was nodding appropriately to questions, seemed oriented) Now sedated and back on propofol and fentanyl Requiring some norepi for blood pressure support  Filed Vitals:   08/16/14 0830 08/16/14 0845 08/16/14 0900 08/16/14 0915  BP: 99/55 94/49 97/51  88/49  Pulse: 65 41 115 120  Temp:      TempSrc:      Resp: 20 20 18 20   Height:      Weight:      SpO2: 97% 96% 97% 95%     Physical Examination Visit Vitals: BP 88/49 mmHg  Pulse 120  Temp(Src) 98.4 F (36.9 C) (Oral)  Resp 20  Ht 5\' 6"  (1.676 m)  Wt 116.3 kg (256 lb 6.3 oz)  BMI 41.40 kg/m2  SpO2 95%  On vent, sedated ETT just replaced, getting CXR Chest clear anteriorly Regular S1S2 No S3 Distant heart sounds no MRG Abd Obese + BS.  Ext edema improved bilat LE's 1+. SCD's  LFA AVF patent   Recent Labs Lab 08/13/14 0648  08/14/14 1545 08/14/14 2000 08/15/14 0455  NA  --   < > 137 137 138  K  --   < > 4.0 4.4 4.4  CL  --   < > 98* 99* 98*  CO2  --   < > 29 26 26   GLUCOSE  --   < > 93 106* 109*  BUN  --   < > 20 32* 38*  CREATININE  --   < > 2.87* 5.19* 5.98*  CALCIUM  --   < > 8.2* 8.6* 8.6*  PHOS 10.3*  --   --   --   --   < > = values in this interval not displayed.  Recent Labs Lab 08/13/14 0222 08/14/14 0200 08/15/14 0455  AST 13* 13* 16  ALT 17 15* 18  ALKPHOS 164* 156* 144*  BILITOT 0.6 1.0 1.1  PROT 7.1 6.8 6.1*  ALBUMIN 3.3* 2.7* 2.4*    Recent Labs Lab 08/13/14 0222  08/13/14 0556 08/14/14 0200 08/15/14 0455  WBC 9.2  --   --  13.2* 14.0*  NEUTROABS 8.1*  --   --   --  11.7*  HGB 11.0*  < > 12.2* 11.8* 11.2*  HCT 34.3*  < > 36.0* 34.8* 33.4*  MCV 93.0  --   --  90.9 91.0  PLT 212  --   --  199 225  < > = values in this interval not displayed.   Medications . sodium chloride  2,000 mL Intravenous Once  . sodium chloride   Intravenous Once  . antiseptic  oral rinse  7 mL Mouth Rinse QID  . chlorhexidine  15 mL Mouth Rinse BID  . etomidate      . feeding supplement (NEPRO CARB STEADY)  1,000 mL Per Tube Q24H  . feeding supplement (PRO-STAT 64)  60 mL Oral 5 X Daily  . insulin aspart  0-9 Units Subcutaneous 6 times per day  . lidocaine (cardiac) 100 mg/6ml      . pantoprazole (PROTONIX) IV  40 mg Intravenous QHS  . rocuronium      . succinylcholine       . cisatracurium (NIMBEX) infusion Stopped (08/14/14 1600)  . fentaNYL infusion INTRAVENOUS 125 mcg/hr (08/16/14 0740)  . heparin 1,500 Units/hr (08/16/14 0740)  . norepinephrine (LEVOPHED) Adult infusion 3 mcg/min (08/16/14 0920)  . propofol (  DIPRIVAN) infusion 30 mcg/kg/min (08/16/14 1029)   sodium chloride, sodium chloride, sodium chloride, sodium chloride, [COMPLETED] cisatracurium **AND** cisatracurium (NIMBEX) infusion **AND** cisatracurium, feeding supplement (NEPRO CARB STEADY), fentaNYL (SUBLIMAZE) injection, fentaNYL (SUBLIMAZE) injection, heparin, lidocaine (PF), lidocaine-prilocaine, pentafluoroprop-tetrafluoroeth  TTS [place] 4h 119kg LFA AVF Heparin 3600 450/800 Hect 4 , Venofer 50/wk, mircera 75 q2wks last 6/21  Assessment: 1. Asystolic cardiac arrest/acute on chronic CHF/severe CM (EF 20% by ECHO 7/1), s/p cooling protocol. Self extubated, reintubated this AM. 2. Pulm edema/ vol overload/OSA/OHS - improving on exam and radiographically, still has extra volume.  3. Hypotension - on some norepi post re-intubation. This may/will preclude ability to HD/pull volume. 4. ESRD on HD. Usual TTS. Plan was for HD today to get off extra volume but not sure BP will allow right now given need for NE support. Will re-evaluate later today. No labs were done this AM - will draw renal and CBC now.  5. Anemia - Hb up, hold esa for now 6. MBD cont vit D w HD for now hold binders 7. HTN on 2 BP meds at home 8. DM on oral agents at home 9. AFib coumadin on hold, getting IV  heparin.  Jamal Maes, MD [facility] [phone] Pager 08/16/2014, 10:52 AM

## 2014-08-16 NOTE — Progress Notes (Signed)
Nutrition Follow-up  DOCUMENTATION CODES:  Morbid obesity  INTERVENTION:  Nepro @ 10 ml/hr with 60 ml Prostat five times per day provides: 1432 kcal, 169 grams protein, 182 ml H2O  TF regimen and propofol at current rate providing 2076 total kcal/day (17.8 kcal/kg of actual weight)   NUTRITION DIAGNOSIS:  Inadequate oral intake related to inability to eat as evidenced by NPO status.  ongoing  GOAL:  Provide needs based on ASPEN/SCCM guidelines  Met.   MONITOR:  TF tolerance, Vent status, Labs, I & O's  REASON FOR ASSESSMENT:  Consult Enteral/tube feeding initiation and management  ASSESSMENT:  The patient is a 59 y.o. year-old with hx HTN, DM, CAD/ MI, ESRD, afib and CHF. He was found unresponsive at home, EMS came and he was in asystole. CPR started by FD.   Patient is currently intubated on ventilator support MV: 10 L/min Temp (24hrs), Avg:98.6 F (37 C), Min:98.2 F (36.8 C), Max:98.8 F (37.1 C)  Propofol: 24.4 ml/hr  644 kcal per day from lipid  MD at bedside, pt had self-extubated and now re-intubated and on Propofol.  Pt now exceeding kcal goals due to propofol.  Nephrology following, pt had missed 2 HD sessions PTA. BP low.   OG tube in distal stomach/proximal duodenum.  TG: 80   Height:  Ht Readings from Last 1 Encounters:  08/13/14 '5\' 6"'  (1.676 m)    Weight:  Wt Readings from Last 1 Encounters:  08/16/14 256 lb 6.3 oz (116.3 kg)    Ideal Body Weight:  64.5 kg  Wt Readings from Last 10 Encounters:  08/16/14 256 lb 6.3 oz (116.3 kg)  08/04/14 265 lb (120.203 kg)  07/07/14 266 lb 1.9 oz (120.711 kg)  04/20/14 267 lb (121.11 kg)  01/31/14 251 lb 12.3 oz (114.2 kg)  11/11/13 266 lb 12 oz (120.997 kg)  11/05/13 271 lb (122.925 kg)  10/27/13 272 lb 6 oz (123.548 kg)  08/04/13 278 lb (126.1 kg)  07/14/13 278 lb (126.1 kg)    BMI:  Body mass index is 41.4 kg/(m^2).  Estimated Nutritional Needs:  Kcal:  1320-1680  Protein:  >/=  161 grams  Fluid:  >1.2 L/day  Skin:  Reviewed, no issues  Diet Order:    NPO  EDUCATION NEEDS:  No education needs identified at this time   Intake/Output Summary (Last 24 hours) at 08/16/14 0848 Last data filed at 08/16/14 0800  Gross per 24 hour  Intake 1358.27 ml  Output     13 ml  Net 1345.27 ml    Last BM:  PTA  Maylon Peppers RD, [place], [place] Pager [phone] After Hours Pager

## 2014-08-16 NOTE — Progress Notes (Addendum)
ANTICOAGULATION CONSULT NOTE - Follow Up Consult  Pharmacy Consult for heparin Indication: atrial fibrillation  Labs:  Recent Labs  08/13/14 1216  08/13/14 1600 08/13/14 2200 08/14/14 0200  08/14/14 1545 08/14/14 2000 08/15/14 0455 08/15/14 1441 08/16/14 0010 08/16/14 0820  HGB  --   --   --   --  11.8*  --   --   --  11.2*  --   --   --   HCT  --   --   --   --  34.8*  --   --   --  33.4*  --   --   --   PLT  --   --   --   --  199  --   --   --  225  --   --   --   APTT 50*  --   --   --   --   --   --   --   --   --   --   --   LABPROT 34.2*  --  30.0*  --   --   --   --   --  22.2*  --   --   --   INR 3.47*  --  2.92*  --   --   --   --   --  1.96*  --   --   --   HEPARINUNFRC  --   --   --   --   --   --   --   --   --  <0.10* 0.15* 0.31  CREATININE 5.26*  < >  --  7.68* 7.88*  < > 2.87* 5.19* 5.98*  --   --   --   TROPONINI  --   --  0.19* 0.21*  --   --   --   --   --   --   --   --   < > = values in this interval not displayed.   Assessment: 60 year old male with ESRD on HD had missed his last 2 dialysis treatments. Admitted 08/13/2014  early AM he developed acute onset SOB and then became unresponsive. EMS noted asystole and began CPR. Total estimated downtime estimated at about 20 mins. In ED he had several runs of VT  PMH: ESRD, Afib, CHF, DM, PAH, DM, OSA, CVA, MI, HTN, GERD, gout, HF, [place] consulted to dose heparin for atrial fibrillation  Anticoagulation: afib on coumadin PTA. INR= 6.82 (vit K 5mg  IV on 08/13/14)> INR 1.96 ; Heparin started  7/3, now at 1500 units/hr with some pink tinged secretions from ET tube, Last clinic visit 5/23: dose reported as 3mg /day except 4.5mg  on Th  Goal of Therapy:  Heparin level 0.3-0.7 units/ml   Plan:  Continue heparin at 1500 units/hr Follow up 9am HL  Thank you for allowing pharmacy to be a part of this patients care team.  Rowe Robert Pharm.D., BCPS, AQ-Cardiology Clinical Pharmacist 08/16/2014  8:05 AM Pager: [phone] Phone: [phone]  08/16/2014 10:32 AM HL now at goal, will repeat HL in 8h to confirm. COOPER, JULIETTE C   ADDENDUM:  Confirmatory HL is now subtherapeutic at 0.19. No issues reported per nurse.  Plan: Increase heparin gtt to 1750 units/hr Check 8 hr HL Monitor daily HL, CBC, s/s of bleed

## 2014-08-16 NOTE — Progress Notes (Signed)
Patient's gastric residuals greater than 400. Dr. Chase Caller made aware. No new orders at this time. Will continue to monitor.

## 2014-08-16 NOTE — Care Management (Signed)
Important Message  Patient Details  Name: Daniel Whitney MRN: [id number] Date of Birth: Dec 13, 1955   Medicare Important Message Given:  Yes-second notification given    Loann Quill 08/16/2014, 9:11 AM

## 2014-08-16 NOTE — Progress Notes (Signed)
Was to have HD today for volume Norepi off transiently and BP dropped into the 60's - back on at 15 Unlikely to get any volume off with HD today given pressor needs K looks fine Will re-evaluate him in the AM  Jamal Maes, MD [place] Pager 08/16/2014, 3:34 PM

## 2014-08-17 ENCOUNTER — Inpatient Hospital Stay (HOSPITAL_COMMUNITY): Payer: Medicare Other

## 2014-08-17 DIAGNOSIS — J9690 Respiratory failure, unspecified, unspecified whether with hypoxia or hypercapnia: Secondary | ICD-10-CM | POA: Insufficient documentation

## 2014-08-17 LAB — RENAL FUNCTION PANEL
ALBUMIN: 2.1 g/dL — AB (ref 3.5–5.0)
ANION GAP: 14 (ref 5–15)
BUN: 90 mg/dL — ABNORMAL HIGH (ref 6–20)
CHLORIDE: 97 mmol/L — AB (ref 101–111)
CO2: 25 mmol/L (ref 22–32)
Calcium: 8.6 mg/dL — ABNORMAL LOW (ref 8.9–10.3)
Creatinine, Ser: 8.56 mg/dL — ABNORMAL HIGH (ref 0.61–1.24)
GFR calc non Af Amer: 6 mL/min — ABNORMAL LOW (ref >60)
GFR, EST AFRICAN AMERICAN: 7 mL/min — AB (ref >60)
Glucose, Bld: 119 mg/dL — ABNORMAL HIGH (ref 65–99)
Phosphorus: 7.3 mg/dL — ABNORMAL HIGH (ref 2.5–4.6)
Potassium: 4.1 mmol/L (ref 3.5–5.1)
SODIUM: 136 mmol/L (ref 135–145)

## 2014-08-17 LAB — GLUCOSE, CAPILLARY
GLUCOSE-CAPILLARY: 113 mg/dL — AB (ref 65–99)
GLUCOSE-CAPILLARY: 88 mg/dL (ref 65–99)
GLUCOSE-CAPILLARY: 126 mg/dL — AB (ref 65–99)
GLUCOSE-CAPILLARY: 121 mg/dL — AB (ref 65–99)
GLUCOSE-CAPILLARY: 138 mg/dL — AB (ref 65–99)
Glucose-Capillary: 119 mg/dL — ABNORMAL HIGH (ref 65–99)
Glucose-Capillary: 113 mg/dL — ABNORMAL HIGH (ref 65–99)

## 2014-08-17 LAB — CBC WITH DIFFERENTIAL/PLATELET
BASOS ABS: 0 10*3/uL (ref 0.0–0.1)
BASOS PCT: 0 % (ref 0–1)
Eosinophils Absolute: 0.4 10*3/uL (ref 0.0–0.7)
Eosinophils Relative: 3 % (ref 0–5)
HCT: 27.7 % — ABNORMAL LOW (ref 39.0–52.0)
Hemoglobin: 9.2 g/dL — ABNORMAL LOW (ref 13.0–17.0)
LYMPHS PCT: 11 % — AB (ref 12–46)
Lymphs Abs: 1.5 10*3/uL (ref 0.7–4.0)
MCH: 30.1 pg (ref 26.0–34.0)
MCHC: 33.2 g/dL (ref 30.0–36.0)
MCV: 90.5 fL (ref 78.0–100.0)
MONOS PCT: 5 % (ref 3–12)
Monocytes Absolute: 0.7 10*3/uL (ref 0.1–1.0)
NEUTROS PCT: 81 % — AB (ref 43–77)
Neutro Abs: 10.8 10*3/uL — ABNORMAL HIGH (ref 1.7–7.7)
PLATELETS: 181 10*3/uL (ref 150–400)
RBC: 3.06 MIL/uL — ABNORMAL LOW (ref 4.22–5.81)
RDW: 15.8 % — AB (ref 11.5–15.5)
WBC: 13.4 10*3/uL — ABNORMAL HIGH (ref 4.0–10.5)

## 2014-08-17 LAB — CALCIUM, IONIZED: CALCIUM, IONIZED, SERUM: 4.7 mg/dL (ref 4.5–5.6)

## 2014-08-17 LAB — HEPARIN LEVEL (UNFRACTIONATED)
Heparin Unfractionated: 0.55 IU/mL (ref 0.30–0.70)
Heparin Unfractionated: 0.56 IU/mL (ref 0.30–0.70)

## 2014-08-17 MED ORDER — SODIUM CHLORIDE 0.9 % IV SOLN: 100.0000 mL | INTRAVENOUS | Status: DC | PRN

## 2014-08-17 MED ORDER — ALTEPLASE 2 MG IJ SOLR
2.0000 mg | Freq: Once | INTRAMUSCULAR | Status: AC | PRN
  Filled 2014-08-17: qty 2

## 2014-08-17 MED ORDER — PENTAFLUOROPROP-TETRAFLUOROETH EX AERO: 1.0000 "application " | INHALATION_SPRAY | CUTANEOUS | Status: DC | PRN

## 2014-08-17 MED ORDER — LIDOCAINE-PRILOCAINE 2.5-2.5 % EX CREA
1.0000 "application " | TOPICAL_CREAM | CUTANEOUS | Status: DC | PRN
  Filled 2014-08-17: qty 5

## 2014-08-17 MED ORDER — NEPRO/CARBSTEADY PO LIQD
237.0000 mL | ORAL | Status: DC | PRN
  Filled 2014-08-17: qty 237

## 2014-08-17 MED ORDER — METOCLOPRAMIDE HCL 5 MG/ML IJ SOLN
10.0000 mg | Freq: Three times a day (TID) | INTRAMUSCULAR | Status: DC
  Administered 2014-08-17: 10 mg via INTRAVENOUS
  Filled 2014-08-17 (×4): qty 2

## 2014-08-17 MED ORDER — LIDOCAINE HCL (PF) 1 % IJ SOLN: 5.0000 mL | INTRAMUSCULAR | Status: DC | PRN

## 2014-08-17 MED ORDER — PANTOPRAZOLE SODIUM 40 MG PO PACK
40.0000 mg | PACK | Freq: Every day | ORAL | Status: DC
  Administered 2014-08-17 – 2014-08-20 (×4): 40 mg
  Filled 2014-08-17 (×6): qty 20

## 2014-08-17 MED ORDER — DARBEPOETIN ALFA 100 MCG/0.5ML IJ SOSY
100.0000 ug | PREFILLED_SYRINGE | INTRAMUSCULAR | Status: DC
  Administered 2014-08-17 – 2014-08-31 (×3): 100 ug via INTRAVENOUS
  Filled 2014-08-17 (×3): qty 0.5

## 2014-08-17 MED ORDER — HEPARIN SODIUM (PORCINE) 1000 UNIT/ML DIALYSIS: 2000.0000 [IU] | INTRAMUSCULAR | Status: DC | PRN

## 2014-08-17 MED ORDER — SODIUM CHLORIDE 0.9 % IV SOLN
62.5000 mg | INTRAVENOUS | Status: DC
  Administered 2014-08-19 – 2014-08-31 (×3): 62.5 mg via INTRAVENOUS
  Filled 2014-08-17 (×5): qty 5

## 2014-08-17 MED ORDER — HEPARIN SODIUM (PORCINE) 1000 UNIT/ML DIALYSIS: 1000.0000 [IU] | INTRAMUSCULAR | Status: DC | PRN

## 2014-08-17 NOTE — Progress Notes (Signed)
eLink Physician-Brief Progress Note Patient Name: Kaler Bernau DOB: 12-Aug-1955 MRN: [id number]   Date of Service  08/17/2014  HPI/Events of Note  High TF residuals despite reduction in TF rate to 10 cc/hr.  Now with residuals greater than 500 cc  eICU Interventions  Plan: Start reglan 10 mg IV q8 hours x 6 doses     Intervention Category Intermediate Interventions: Other:  Amenda Duclos 08/17/2014, 5:29 AM

## 2014-08-17 NOTE — Progress Notes (Signed)
ANTICOAGULATION CONSULT NOTE - Follow Up Consult  Pharmacy Consult for heparin Indication: atrial fibrillation   Labs:  Recent Labs  08/14/14 2000  08/15/14 0455  08/16/14 0820 08/16/14 1117 08/16/14 1210 08/16/14 1700 08/17/14 0334  HGB  --   < > 11.2*  --   --   --  9.2*  --  9.2*  HCT  --   --  33.4*  --   --   --  28.2*  --  27.7*  PLT  --   --  225  --   --   --  183  --  181  LABPROT  --   --  22.2*  --   --   --   --   --   --   INR  --   --  1.96*  --   --   --   --   --   --   HEPARINUNFRC  --   --   --   < > 0.31  --   --  0.19* 0.55  CREATININE 5.19*  --  5.98*  --   --  7.74*  --   --   --   < > = values in this interval not displayed.    Assessment/Plan:  59yo male therapeutic on heparin after rate increase. Will continue gtt at current rate and confirm stable with additional level.   Wynona Neat, PharmD, BCPS  08/17/2014,4:02 AM

## 2014-08-17 NOTE — Clinical Documentation Improvement (Signed)
  Chart documentation states "volume overload", "pulonary edema", "Acute on chronic CHF" treated with HD. Please clarify type of CHF: systolic, diastolic, combined.  Thank Dennis Bast  Barrie Dunker RN [place] [phone] HIM department

## 2014-08-17 NOTE — Progress Notes (Signed)
PULMONARY / CRITICAL CARE MEDICINE   Name: Daniel Whitney MRN: [id number] DOB: June 04, 1955    ADMISSION DATE:  08/13/2014 CONSULTATION DATE:  08/13/2014  REFERRING MD :  Dr. Lita Mains  CHIEF COMPLAINT: Asystole  INITIAL PRESENTATION:  59 year old male with ESRD on HD had missed his last 2 dialysis treatments. 7/01 early AM he developed acute onset SOB and then became unresponsive. EMS noted asystole and began CPR. Total downtime estimated at about 20 mins. In ED he had several runs of VT, but was otherwise was hemodynamically stable on ventilator. PCCM to admit.  STUDIES:  7/01 CT head: NAICP 7/01 EEG: This sedated EEG is abnormal due to moderate diffuse slowing and suppression of the background 7/01 TTE: LVEF 20% 7/1 cardiac arrest asystole > to ICU. Arcitc sun protocol 7/2- HD, rewarm  08/15/14: :peep to 5 successful last night    SUBJECTIVE/OVERNIGHT/INTERVAL HX 08/17/14: Self extubated yesterday while off dipriva and promptly reintubated.  Following commands per RN. On low dose levophed. HD ongoing.   VITAL SIGNS: Temp:  [97.4 F (36.3 C)-98.8 F (37.1 C)] 97.7 F (36.5 C) (07/05 0745) Pulse Rate:  [28-120] 28 (07/05 0815) Resp:  [0-27] 22 (07/05 0815) BP: (65-128)/(28-79) 88/46 mmHg (07/05 0815) SpO2:  [47 %-100 %] 99 % (07/05 0815) FiO2 (%):  [40 %-50 %] 40 % (07/05 0745) Weight:  [117.7 kg (259 lb 7.7 oz)] 117.7 kg (259 lb 7.7 oz) (07/05 0300) HEMODYNAMICS: CVP:  [7 mmHg-16 mmHg] 16 mmHg VENTILATOR SETTINGS: Vent Mode:  [-] PSV;CPAP FiO2 (%):  [40 %-50 %] 40 % Set Rate:  [20 bmp] 20 bmp Vt Set:  [510 mL] 510 mL PEEP:  [5 cmH20] 5 cmH20 Pressure Support:  [5 cmH20] 5 cmH20 Plateau Pressure:  [20 cmH20-26 cmH20] 25 cmH20 INTAKE / OUTPUT:  Intake/Output Summary (Last 24 hours) at 08/17/14 0831 Last data filed at 08/17/14 0800  Gross per 24 hour  Intake 1615.48 ml  Output    120 ml  Net 1495.48 ml    PHYSICAL EXAMINATION: General: rass 0 followed commands. Looks  very deconditioned Neuro:  PERRL  2 mm , followed very simple commands, rass 0 ,. Blank stare +, Does not track . Not lifting limbs above gravity HEENT:  JVD down, obese neck Cardiovascular:  s1 s2 RRR Lungs:  CTA anterior Abdomen:  Obese, soft, non-distended Ext:  +2 symmetric BLE edema non pitting, unchanged, VERY deconditioned Skin:  Grossly intact  LABS: PULMONARY  Recent Labs Lab 08/13/14 1207 08/14/14 0916 08/15/14 0538 08/15/14 1123 08/16/14 0500  PHART 7.348* 7.423 7.358 7.330* 7.352  PCO2ART 42.1 34.1* 44.4 53.0* 48.8*  PO2ART 17.0* 45.0* 79.5* 60.0* 82.1  HCO3 24.1* 23.3 24.3* 27.9* 26.3*  TCO2 26 25 25.7 30 27.8  O2SAT 30.0 90.0 95.3 88.0 94.8    CBC  Recent Labs Lab 08/15/14 0455 08/16/14 1210 08/17/14 0334  HGB 11.2* 9.2* 9.2*  HCT 33.4* 28.2* 27.7*  WBC 14.0* 12.3* 13.4*  PLT 225 183 181    COAGULATION  Recent Labs Lab 08/13/14 0630 08/13/14 1216 08/13/14 1600 08/15/14 0455  INR 6.82* 3.47* 2.92* 1.96*    CARDIAC   Recent Labs Lab 08/13/14 0826 08/13/14 1023 08/13/14 1600 08/13/14 2200  TROPONINI 0.12* 0.17* 0.19* 0.21*   No results for input(s): PROBNP in the last 168 hours.   CHEMISTRY  Recent Labs Lab 08/13/14 [id number]  08/14/14 1217 08/14/14 1545 08/14/14 2000 08/15/14 0455 08/16/14 1117 08/17/14 0335  NA  --   < > 136 137  137 138 137 136  K  --   < > 3.8 4.0 4.4 4.4 4.2 4.1  CL  --   < > 98* 98* 99* 98* 99* 97*  CO2  --   < > 25 29 26 26 25 25   GLUCOSE  --   < > 120* 93 106* 109* 147* 119*  BUN  --   < > 59* 20 32* 38* 69* 90*  CREATININE  --   < > 6.64* 2.87* 5.19* 5.98* 7.74* 8.56*  CALCIUM  --   < > 8.6* 8.2* 8.6* 8.6* 8.6* 8.6*  MG 2.4  --  2.0  --   --   --   --   --   PHOS 10.3*  --   --   --   --   --  6.4* 7.3*  < > = values in this interval not displayed. Estimated Creatinine Clearance: 11.1 mL/min (by C-G formula based on Cr of 8.56).   LIVER  Recent Labs Lab 08/13/14 0222 08/13/14 0630  08/13/14 1216 08/13/14 1600 08/14/14 0200 08/15/14 0455 08/16/14 1117 08/17/14 0335  AST 13*  --   --   --  13* 16  --   --   ALT 17  --   --   --  15* 18  --   --   ALKPHOS 164*  --   --   --  156* 144*  --   --   BILITOT 0.6  --   --   --  1.0 1.1  --   --   PROT 7.1  --   --   --  6.8 6.1*  --   --   ALBUMIN 3.3*  --   --   --  2.7* 2.4* 2.2* 2.1*  INR  --  6.82* 3.47* 2.92*  --  1.96*  --   --      INFECTIOUS  Recent Labs Lab 08/13/14 0143  LATICACIDVEN 2.93*     ENDOCRINE CBG (last 3)   Recent Labs  08/16/14 2306 08/17/14 0335 08/17/14 0735  GLUCAP 88 119* 126*         IMAGING x48h  - image(s) personally visualized  -   highlighted in bold Dg Chest Port 1 View  08/17/2014   CLINICAL DATA:  Cardiac arrest  EXAM: PORTABLE CHEST - 1 VIEW  COMPARISON:  08/16/2014  FINDINGS: The endotracheal tube is in good position, with tip between the clavicular heads and carina. The orogastric tube enters the stomach at least. Stable left subclavian central line, tip near the upper SVC.  Worsening opacification of the lower chest compatible with airspace disease. There may be superimposed layering fluid. No air leak or pulmonary edema in the apical lungs.  Stable cardiomegaly.  IMPRESSION: 1. Stable positioning of tubes and central line. 2. Progression of extensive airspace disease, potentially aspiration pneumonia given the history.   Electronically Signed   By: Monte Fantasia M.D.   On: 08/17/2014 06:00   Dg Chest Port 1 View  08/16/2014   CLINICAL DATA:  Hypoxia.  OG tube placement.  Re-intubation.  EXAM: PORTABLE CHEST - 1 VIEW  COMPARISON:  08/15/2014  FINDINGS: Endotracheal tube 2.6 cm above the carina. NG tube is in the stomach. Left central line tip is in the SVC, unchanged. There is cardiomegaly. Low lung volumes with diffuse bilateral airspace opacities. Possible small effusions. No acute bony abnormality.  IMPRESSION: Low lung volumes with diffuse bilateral airspace  disease.  Endotracheal  tube 2.6 cm above the carina.   Electronically Signed   By: Rolm Baptise M.D.   On: 08/16/2014 10:54   Dg Abd Portable 1v  08/16/2014   CLINICAL DATA:  OG tube placement  EXAM: PORTABLE ABDOMEN - 1 VIEW  COMPARISON:  08/13/2014  FINDINGS: OG tube tip is in the proximal to mid stomach. Nonobstructive bowel gas pattern.  IMPRESSION: Enteric tube tip in the proximal to mid stomach.   Electronically Signed   By: Rolm Baptise M.D.   On: 08/16/2014 10:56        ASSESSMENT / PLAN:  PULMONARY ETT 7/1 >> 08/16/14, 7/4 (self extubated and reintubated) >> A: Acute respiratory failure s/p cardiac arrest Pulm edema vs ARDS OSA/OHS   - Doing SBT but not extubatable candidate due to diffuse weakness  P:   Full vent support; no extubation   CARDIOVASCULAR L [place] CVL [id number] >>  A:  Asystolic cardiac arrest Acute on chronic CHF Severe cardiomyopathy H/O AF (sinus presently) H/o HTN, CAD NEW ECG QRS complex changes - resolved with temp increase    - on low dose levophed. Havng some ectopy - low EF  P:  Telemetry monitoring Levo to map goal, may need to accept MAP 55 with good MS   RENAL A:   ESRD on HD Noncompliance with HD Hyperkalemia, resolved Hyponatremia, mild Mild metabolic acidosis   - on HD per renal P:   For repeat HD, goal neg balance likely kvo Chem in am   GASTROINTESTINAL A:   High residuals and eMD started on reglan 08/17/14 earlier   P:   SUP: IV PPI TF to continue DC reglan (QTc 53msec per RN)   HEMATOLOGIC A:   Anemia of chronic illness Excessive warfarin anticoagulation Jehovah's witness - family confirms 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 Wbc from hemoconcetration   - unchanged  P:  DVT px: bloody secretions - resolved Continued hep drip Cbc daily needed  INFECTIOUS A:   At risk infection with cooling P:   Follow fever curve pcxr in am   ENDOCRINE A:   DM 2 R/o sick euthyroid vs hyper P:   CBG monitoring and SSI Would repeat  thyroid fxn in a few weeks  NEUROLOGIC A:   Acute anoxic encephalopathy - anoxia - now awake follows commands but diffusely weak L3 fracture (chronic) P:   rass goal 0  wua mandatory today Monitor off sedation but restart if gets agitated   FAMILY  - Updates: none at bedside   - Inter-disciplinary family meet or Palliative Care meeting due by:  7/8   GLOBAL  - continue balance between sedation wean and preventing self extubation. Difusely weak; likely might need trach. - we have to see how next few days ago. Cut down blood daws - due to [employer]     The patient is critically ill with multiple organ systems failure and requires high complexity decision making for assessment and support, frequent evaluation and titration of therapies, application of advanced monitoring technologies and extensive interpretation of multiple databases.   Critical Care Time devoted to patient care services described in this note is  35  Minutes. This time reflects time of care of this signee Dr Brand Males. This critical care time does not reflect procedure time, or teaching time or supervisory time of PA/NP/Med student/Med Resident etc but could involve care discussion time    Dr. Brand Males, M.D., [facility].C.P Pulmonary and Critical Care Medicine Staff Physician [place] [facility]  [facility] Pager: [phone], If no answer or between  15:00h - 7:00h: call [phone]  08/17/2014 8:45 AM

## 2014-08-17 NOTE — Progress Notes (Signed)
ANTICOAGULATION CONSULT NOTE - Follow Up Consult  Pharmacy Consult for heparin Indication: atrial fibrillation  Labs:  Recent Labs  08/15/14 0455  08/16/14 1117 08/16/14 1210 08/16/14 1700 08/17/14 0334 08/17/14 0335 08/17/14 1025  HGB 11.2*  --   --  9.2*  --  9.2*  --   --   HCT 33.4*  --   --  28.2*  --  27.7*  --   --   PLT 225  --   --  183  --  181  --   --   LABPROT 22.2*  --   --   --   --   --   --   --   INR 1.96*  --   --   --   --   --   --   --   HEPARINUNFRC  --   < >  --   --  0.19* 0.55  --  0.56  CREATININE 5.98*  --  7.74*  --   --   --  8.56*  --   < > = values in this interval not displayed.   Assessment: 59 year old male with ESRD on HD had missed his last 2 dialysis treatments. Admitted 08/13/2014  early AM he developed acute onset SOB and then became unresponsive. EMS noted asystole and began CPR. Total estimated downtime estimated at about 20 mins. In ED he had several runs of VT  PMH: ESRD, Afib, CHF, DM, PAH, DM, OSA, CVA, MI, HTN, GERD, gout, HF, [place] consulted to dose heparin for atrial fibrillation  Anticoagulation: afib on coumadin PTA. INR= 6.82 (vit K 5mg  IV on 08/13/14)> INR 1.96 ; Heparin started  7/3, now at 1750 units/hr with therapeutic heparin level x2.  Last clinic visit 5/23: dose reported as 3mg /day except 4.5mg  on Th  Goal of Therapy:  Heparin level 0.3-0.7 units/ml   Plan:  Continue heparin at 1750 units/hr Daily HL/CBC Continue to hold coumadin  Thank you for allowing pharmacy to be a part of this patients care team.  Erin Hearing PharmD., BCPS Clinical Pharmacist Pager [phone] 08/17/2014 11:44 AM

## 2014-08-17 NOTE — Procedures (Signed)
I have personally attended this patient's dialysis session.   4K bath Trying for 2-3 liters and thus far have not had to increase norepi dose - goal to keep SBP >95-100 Left AVF 400   Restarting Aranesp - 100 mcg with HD today Will resume weekly Fe with HD on Thursday  Jamal Maes, MD [place] Pager 08/17/2014, 10:16 AM

## 2014-08-17 NOTE — Progress Notes (Signed)
[facility] Progress Note   Subjective:   Sedation off, pt seems to respond appropriately On HD with BP 90-100 Tolerating vol removal surprisingly well Still on small amount norepi  Filed Vitals:   08/17/14 0900 08/17/14 0915 08/17/14 0930 08/17/14 0945  BP: 90/37 101/57 104/66 106/42  Pulse: 69 71 25 76  Temp:      TempSrc:      Resp: 20 26 21 19   Height:      Weight:      SpO2: 99% 96% 96% 98%     Physical Examination Visit Vitals: BP 104/53 mmHg  Pulse 73  Temp(Src) 97.7 F (36.5 C) (Oral)  Resp 21  Ht 5\' 6"  (1.676 m)  Wt 117.7 kg (259 lb 7.7 oz)  BMI 41.90 kg/m2  SpO2 97%   Pre HD weight 117.7 kg (EDW 119) On vent, awake, nods appropriately to questions, denies pain or SOB ETT in place, OG tube and left subclavian line Chest clear anteriorly Regular S1S2 No S3 Distant heart sounds no MRG Abd Obese + BS.  Ext edema improved bilat LE's 1+. SCD's  LFA AVF in use for HD   Recent Labs Lab 08/13/14 0648  08/15/14 0455 08/16/14 1117 08/17/14 0335  NA  --   < > 138 137 136  K  --   < > 4.4 4.2 4.1  CL  --   < > 98* 99* 97*  CO2  --   < > 26 25 25   GLUCOSE  --   < > 109* 147* 119*  BUN  --   < > 38* 69* 90*  CREATININE  --   < > 5.98* 7.74* 8.56*  CALCIUM  --   < > 8.6* 8.6* 8.6*  PHOS 10.3*  --   --  6.4* 7.3*  < > = values in this interval not displayed.  Recent Labs Lab 08/13/14 0222 08/14/14 0200 08/15/14 0455 08/16/14 1117 08/17/14 0335  AST 13* 13* 16  --   --   ALT 17 15* 18  --   --   ALKPHOS 164* 156* 144*  --   --   BILITOT 0.6 1.0 1.1  --   --   PROT 7.1 6.8 6.1*  --   --   ALBUMIN 3.3* 2.7* 2.4* 2.2* 2.1*    Recent Labs Lab 08/13/14 0222  08/15/14 0455 08/16/14 1210 08/17/14 0334  WBC 9.2  < > 14.0* 12.3* 13.4*  NEUTROABS 8.1*  --  11.7*  --  10.8*  HGB 11.0*  < > 11.2* 9.2* 9.2*  HCT 34.3*  < > 33.4* 28.2* 27.7*  MCV 93.0  < > 91.0 90.7 90.5  PLT 212  < > 225 183 181  < > = values in this interval not  displayed.   Medications . sodium chloride  2,000 mL Intravenous Once  . sodium chloride   Intravenous Once  . antiseptic oral rinse  7 mL Mouth Rinse QID  . chlorhexidine  15 mL Mouth Rinse BID  . feeding supplement (NEPRO CARB STEADY)  1,000 mL Per Tube Q24H  . feeding supplement (PRO-STAT 64)  60 mL Oral 5 X Daily  . insulin aspart  0-9 Units Subcutaneous 6 times per day  . pantoprazole (PROTONIX) IV  40 mg Intravenous QHS   . fentaNYL infusion INTRAVENOUS 200 mcg/hr (08/17/14 0915)  . heparin 1,750 Units/hr (08/17/14 0859)  . norepinephrine (LEVOPHED) Adult infusion 2 mcg/min (08/17/14 [id number])  . propofol (DIPRIVAN) infusion Stopped (08/17/14  0720)   sodium chloride, sodium chloride, sodium chloride, sodium chloride, sodium chloride, sodium chloride, alteplase, feeding supplement (NEPRO CARB STEADY), fentaNYL (SUBLIMAZE) injection, fentaNYL (SUBLIMAZE) injection, heparin, [START ON 08/18/2014] heparin, lidocaine (PF), lidocaine-prilocaine, pentafluoroprop-tetrafluoroeth  TTS [place] 4h  EDW 119kg will have lower EDW at discharge  LFA AVF Heparin 3600 450/800 Hect 4 , Venofer 50/wk, mircera 75 q2wks last 6/21  Assessment: 1. Asystolic cardiac arrest/acute on chronic CHF/severe CM (EF 20% by ECHO 7/1), s/p cooling protocol. Self extubated, reintubated yesterday and stable.  2. Pulm edema/ vol overload/OSA/OHS - improving on exam and radiographically, still has extra volume. Tolerating fluid removal with HD today. Is actually below his outpt EDW prior to today's treatment. 3. Hypotension - on some norepi since re-intubation. This may/will preclude ability to HD/pull volume. Have not had to increase NE dose yet today with HD 4. ESRD on HD. Usual TTS. HD now. Goal 2-3 liters as BP tolerates 5. Anemia - last 2 Hb's <10. Resume ESA Use Aranesp 100/week (7/5) and start weekly iron back with HD 6. MBD resume calcitriol and binders when able 7. DM on oral agents at home 8. AFib coumadin  on hold, getting IV heparin.  Jamal Maes, MD [facility] [phone] Pager 08/17/2014, 10:03 AM

## 2014-08-18 LAB — RENAL FUNCTION PANEL
Albumin: 2.1 g/dL — ABNORMAL LOW (ref 3.5–5.0)
Anion gap: 11 (ref 5–15)
BUN: 77 mg/dL — ABNORMAL HIGH (ref 6–20)
CHLORIDE: 98 mmol/L — AB (ref 101–111)
CO2: 28 mmol/L (ref 22–32)
CREATININE: 6.04 mg/dL — AB (ref 0.61–1.24)
Calcium: 8.7 mg/dL — ABNORMAL LOW (ref 8.9–10.3)
GFR calc Af Amer: 11 mL/min — ABNORMAL LOW (ref >60)
GFR calc non Af Amer: 9 mL/min — ABNORMAL LOW (ref >60)
Glucose, Bld: 126 mg/dL — ABNORMAL HIGH (ref 65–99)
Phosphorus: 5.4 mg/dL — ABNORMAL HIGH (ref 2.5–4.6)
Potassium: 4.3 mmol/L (ref 3.5–5.1)
SODIUM: 137 mmol/L (ref 135–145)

## 2014-08-18 LAB — GLUCOSE, CAPILLARY
Glucose-Capillary: 120 mg/dL — ABNORMAL HIGH (ref 65–99)
Glucose-Capillary: 115 mg/dL — ABNORMAL HIGH (ref 65–99)
Glucose-Capillary: 114 mg/dL — ABNORMAL HIGH (ref 65–99)
Glucose-Capillary: 124 mg/dL — ABNORMAL HIGH (ref 65–99)
Glucose-Capillary: 126 mg/dL — ABNORMAL HIGH (ref 65–99)
Glucose-Capillary: 121 mg/dL — ABNORMAL HIGH (ref 65–99)

## 2014-08-18 LAB — CULTURE, BLOOD (ROUTINE X 2)
CULTURE: NO GROWTH
Culture: NO GROWTH

## 2014-08-18 LAB — CBC
HCT: 30.1 % — ABNORMAL LOW (ref 39.0–52.0)
Hemoglobin: 9.6 g/dL — ABNORMAL LOW (ref 13.0–17.0)
MCH: 29.6 pg (ref 26.0–34.0)
MCHC: 31.9 g/dL (ref 30.0–36.0)
MCV: 92.9 fL (ref 78.0–100.0)
PLATELETS: 191 10*3/uL (ref 150–400)
RBC: 3.24 MIL/uL — AB (ref 4.22–5.81)
RDW: 15.9 % — ABNORMAL HIGH (ref 11.5–15.5)
WBC: 13 10*3/uL — AB (ref 4.0–10.5)

## 2014-08-18 LAB — HEPARIN LEVEL (UNFRACTIONATED): HEPARIN UNFRACTIONATED: 0.5 [IU]/mL (ref 0.30–0.70)

## 2014-08-18 LAB — MAGNESIUM: Magnesium: 1.9 mg/dL (ref 1.7–2.4)

## 2014-08-18 LAB — PROTIME-INR
INR: 1.97 — AB (ref 0.00–1.49)
Prothrombin Time: 22.3 seconds — ABNORMAL HIGH (ref 11.6–15.2)

## 2014-08-18 MED ORDER — FENTANYL CITRATE (PF) 100 MCG/2ML IJ SOLN
50.0000 ug | INTRAMUSCULAR | Status: DC | PRN
  Administered 2014-08-18 – 2014-08-21 (×12): 100 ug via INTRAVENOUS
  Filled 2014-08-18: qty 2
  Filled 2014-08-18: qty 4
  Filled 2014-08-18 (×10): qty 2

## 2014-08-18 MED ORDER — SODIUM CHLORIDE 0.9 % IJ SOLN
10.0000 mL | INTRAMUSCULAR | Status: DC | PRN
  Administered 2014-08-29: 30 mL
  Administered 2014-08-30 (×2): 10 mL
  Administered 2014-08-30: 30 mL
  Administered 2014-08-30 – 2014-08-31 (×4): 10 mL
  Filled 2014-08-18 (×8): qty 40

## 2014-08-18 MED ORDER — FUROSEMIDE 10 MG/ML IJ SOLN
80.0000 mg | Freq: Once | INTRAMUSCULAR | Status: AC
  Administered 2014-08-18: 80 mg via INTRAVENOUS
  Filled 2014-08-18: qty 8

## 2014-08-18 MED ORDER — SODIUM CHLORIDE 0.9 % IJ SOLN
10.0000 mL | Freq: Two times a day (BID) | INTRAMUSCULAR | Status: DC
  Administered 2014-08-18: 30 mL
  Administered 2014-08-19: 10 mL
  Administered 2014-08-19: 20 mL
  Administered 2014-08-20 – 2014-08-28 (×13): 10 mL
  Administered 2014-08-30: 30 mL

## 2014-08-18 NOTE — Progress Notes (Signed)
Nutrition Follow-up  DOCUMENTATION CODES:  Morbid obesity  INTERVENTION:  Nepro @ 10 ml/hr with 60 ml Prostat five times per day provides: 1432 kcal, 169 grams protein, 182 ml H2O  NUTRITION DIAGNOSIS:  Inadequate oral intake related to inability to eat as evidenced by NPO status.  ongoing  GOAL:  Provide needs based on ASPEN/SCCM guidelines  Met.   MONITOR:  TF tolerance, Vent status, Labs, I & O's  REASON FOR ASSESSMENT:  Consult Enteral/tube feeding initiation and management  ASSESSMENT:  The patient is a 59 y.o. year-old with hx HTN, DM, CAD/ MI, ESRD, afib and CHF. He was found unresponsive at home, EMS came and he was in asystole. CPR started by FD.   Patient is currently intubated on ventilator support MV: 13.3 L/min Temp (24hrs), Avg:99.5 F (37.5 C), Min:99.3 F (37.4 C), Max:100 F (37.8 C)  Propofol: off, pt no longer exceeding kcal needs  Labs reviewed: BUN/Cr and Phosphorus elevated, cbg's: 114-124 Meds reviewed. Pt receiving HD. OG tube in distal stomach/proximal duodenum.     Height:  Ht Readings from Last 1 Encounters:  08/13/14 '5\' 6"'  (1.676 m)    Weight:  Wt Readings from Last 1 Encounters:  08/18/14 257 lb 0.9 oz (116.6 kg)    Ideal Body Weight:  64.5 kg  Wt Readings from Last 10 Encounters:  08/18/14 257 lb 0.9 oz (116.6 kg)  08/04/14 265 lb (120.203 kg)  07/07/14 266 lb 1.9 oz (120.711 kg)  04/20/14 267 lb (121.11 kg)  01/31/14 251 lb 12.3 oz (114.2 kg)  11/11/13 266 lb 12 oz (120.997 kg)  11/05/13 271 lb (122.925 kg)  10/27/13 272 lb 6 oz (123.548 kg)  08/04/13 278 lb (126.1 kg)  07/14/13 278 lb (126.1 kg)    BMI:  Body mass index is 41.51 kg/(m^2).  Estimated Nutritional Needs:  Kcal:  3212-2482  Protein:  >/= 161 grams  Fluid:  >1.2 L/day  Skin:  Reviewed, no issues  Diet Order:    NPO  EDUCATION NEEDS:  No education needs identified at this time   Intake/Output Summary (Last 24 hours) at 08/18/14  1432 Last data filed at 08/18/14 1400  Gross per 24 hour  Intake 1469.97 ml  Output    155 ml  Net 1314.97 ml    Last BM:  7/6  Maylon Peppers RD, LDN, [place] Pager [phone] After Hours Pager

## 2014-08-18 NOTE — Progress Notes (Signed)
PULMONARY / CRITICAL CARE MEDICINE   Name: Daniel Whitney MRN: [id number] DOB: [birth year]    ADMISSION DATE:  08/13/2014 CONSULTATION DATE:  08/13/2014  REFERRING MD :  Dr. Lita Mains  CHIEF COMPLAINT: Asystole  INITIAL PRESENTATION:  59 year old male with ESRD on HD had missed his last 2 dialysis treatments. 7/01 early AM he developed acute onset SOB and then became unresponsive. EMS noted asystole and began CPR. Total downtime estimated at about 20 mins. In ED he had several runs of VT, but was otherwise was hemodynamically stable on ventilator. PCCM to admit.  STUDIES:  7/01 CT head: NAICP 7/01 EEG: This sedated EEG is abnormal due to moderate diffuse slowing and suppression of the background 7/01 TTE: LVEF 20% 7/1 cardiac arrest asystole > to ICU. Arcitc sun protocol 7/2- HD, rewarm  08/15/14: :peep to 5 successful last night 08/16/14: SEfl extubated and promptly reintubated 08/17/14: Self extubated yesterday while off dipriva and promptly reintubated.  Following commands per RN. On low dose levophed. HD ongoing.     SUBJECTIVE/OVERNIGHT/INTERVAL HX 08/18/14: On 13mcg levophed. On diporivan and fent gtt -> follows commands (simple) . REglan for TF residuals held yesterday due to high Qtc. QTc 0.42 today. CXR yesterday very congested  VITAL SIGNS: Temp:  [98.5 F (36.9 C)-100 F (37.8 C)] 99.3 F (37.4 C) (07/06 0830) Pulse Rate:  [25-132] 71 (07/06 0830) Resp:  [15-26] 20 (07/06 0830) BP: (74-146)/(32-86) 111/59 mmHg (07/06 0830) SpO2:  [93 %-100 %] 100 % (07/06 0830) FiO2 (%):  [40 %] 40 % (07/06 0735) Weight:  [114.8 kg (253 lb 1.4 oz)-115.5 kg (254 lb 10.1 oz)] 115.5 kg (254 lb 10.1 oz) (07/06 0500) HEMODYNAMICS:   VENTILATOR SETTINGS: Vent Mode:  [-] PRVC FiO2 (%):  [40 %] 40 % Set Rate:  [20 bmp] 20 bmp Vt Set:  [510 mL] 510 mL PEEP:  [5 cmH20] 5 cmH20 Plateau Pressure:  [19 cmH20-23 cmH20] 23 cmH20 INTAKE / OUTPUT:  Intake/Output Summary (Last 24 hours) at 08/18/14  0843 Last data filed at 08/18/14 0800  Gross per 24 hour  Intake 1810.51 ml  Output   3150 ml  Net -1339.49 ml    PHYSICAL EXAMINATION: General: rass 0 followed commands. Looks very deconditioned Neuro:  PERRL  2 mm , followed very simple commands, rass 0 ,. Blank stare +, Does not track . Not lifting limbs above gravity. -> on sedation gtt HEENT:  JVD down, obese neck Cardiovascular:  s1 s2 RRR Lungs:  CTA anterior Abdomen:  Obese, soft, non-distended Ext:  +2 symmetric BLE edema non pitting, unchanged, VERY deconditioned Skin:  Grossly intact  LABS: PULMONARY  Recent Labs Lab 08/13/14 1207 08/14/14 0916 08/15/14 0538 08/15/14 1123 08/16/14 0500  PHART 7.348* 7.423 7.358 7.330* 7.352  PCO2ART 42.1 34.1* 44.4 53.0* 48.8*  PO2ART 17.0* 45.0* 79.5* 60.0* 82.1  HCO3 24.1* 23.3 24.3* 27.9* 26.3*  TCO2 26 25 25.7 30 27.8  O2SAT 30.0 90.0 95.3 88.0 94.8    CBC  Recent Labs Lab 08/16/14 1210 08/17/14 0334 08/18/14 0520  HGB 9.2* 9.2* 9.6*  HCT 28.2* 27.7* 30.1*  WBC 12.3* 13.4* 13.0*  PLT 183 181 191    COAGULATION  Recent Labs Lab 08/13/14 0630 08/13/14 1216 08/13/14 1600 08/15/14 0455  INR 6.82* 3.47* 2.92* 1.96*    CARDIAC    Recent Labs Lab 08/13/14 0826 08/13/14 1023 08/13/14 1600 08/13/14 2200  TROPONINI 0.12* 0.17* 0.19* 0.21*   No results for input(s): PROBNP in the last 168 hours.  CHEMISTRY  Recent Labs Lab 08/13/14 [id number]  08/14/14 1217  08/14/14 2000 08/15/14 0455 08/16/14 1117 08/17/14 0335 08/18/14 0520  NA  --   < > 136  < > 137 138 137 136 137  K  --   < > 3.8  < > 4.4 4.4 4.2 4.1 4.3  CL  --   < > 98*  < > 99* 98* 99* 97* 98*  CO2  --   < > 25  < > 26 26 25 25 28   GLUCOSE  --   < > 120*  < > 106* 109* 147* 119* 126*  BUN  --   < > 59*  < > 32* 38* 69* 90* 77*  CREATININE  --   < > 6.64*  < > 5.19* 5.98* 7.74* 8.56* 6.04*  CALCIUM  --   < > 8.6*  < > 8.6* 8.6* 8.6* 8.6* 8.7*  MG 2.4  --  2.0  --   --   --   --   --    --   PHOS 10.3*  --   --   --   --   --  6.4* 7.3* 5.4*  < > = values in this interval not displayed. Estimated Creatinine Clearance: 15.5 mL/min (by C-G formula based on Cr of 6.04).   LIVER  Recent Labs Lab 08/13/14 0222 08/13/14 0630 08/13/14 1216 08/13/14 1600 08/14/14 0200 08/15/14 0455 08/16/14 1117 08/17/14 0335 08/18/14 0520  AST 13*  --   --   --  13* 16  --   --   --   ALT 17  --   --   --  15* 18  --   --   --   ALKPHOS 164*  --   --   --  156* 144*  --   --   --   BILITOT 0.6  --   --   --  1.0 1.1  --   --   --   PROT 7.1  --   --   --  6.8 6.1*  --   --   --   ALBUMIN 3.3*  --   --   --  2.7* 2.4* 2.2* 2.1* 2.1*  INR  --  6.82* 3.47* 2.92*  --  1.96*  --   --   --      INFECTIOUS  Recent Labs Lab 08/13/14 0143  LATICACIDVEN 2.93*     ENDOCRINE CBG (last 3)   Recent Labs  08/18/14 0110 08/18/14 0507 08/18/14 0736  GLUCAP 120* 115* 114*         IMAGING x48h  - image(s) personally visualized  -   highlighted in bold Dg Chest Port 1 View  08/17/2014   CLINICAL DATA:  Cardiac arrest  EXAM: PORTABLE CHEST - 1 VIEW  COMPARISON:  08/16/2014  FINDINGS: The endotracheal tube is in good position, with tip between the clavicular heads and carina. The orogastric tube enters the stomach at least. Stable left subclavian central line, tip near the upper SVC.  Worsening opacification of the lower chest compatible with airspace disease. There may be superimposed layering fluid. No air leak or pulmonary edema in the apical lungs.  Stable cardiomegaly.  IMPRESSION: 1. Stable positioning of tubes and central line. 2. Progression of extensive airspace disease, potentially aspiration pneumonia given the history.   Electronically Signed   By: Monte Fantasia M.D.   On: 08/17/2014 06:00   Dg Chest [facility]  08/16/2014   CLINICAL DATA:  Hypoxia.  OG tube placement.  Re-intubation.  EXAM: PORTABLE CHEST - 1 VIEW  COMPARISON:  08/15/2014  FINDINGS: Endotracheal tube 2.6  cm above the carina. NG tube is in the stomach. Left central line tip is in the SVC, unchanged. There is cardiomegaly. Low lung volumes with diffuse bilateral airspace opacities. Possible small effusions. No acute bony abnormality.  IMPRESSION: Low lung volumes with diffuse bilateral airspace disease.  Endotracheal tube 2.6 cm above the carina.   Electronically Signed   By: Rolm Baptise M.D.   On: 08/16/2014 10:54   Dg Abd Portable 1v  08/16/2014   CLINICAL DATA:  OG tube placement  EXAM: PORTABLE ABDOMEN - 1 VIEW  COMPARISON:  08/13/2014  FINDINGS: OG tube tip is in the proximal to mid stomach. Nonobstructive bowel gas pattern.  IMPRESSION: Enteric tube tip in the proximal to mid stomach.   Electronically Signed   By: Rolm Baptise M.D.   On: 08/16/2014 10:56        ASSESSMENT / PLAN:  PULMONARY ETT 7/1 >> 08/16/14, 7/4 (self extubated and reintubated) >> A: Acute respiratory failure s/p cardiac arrest Pulm edema vs ARDS OSA/OHS   - Doing SBT but not extubatable candidate due to diffuse weakness and bilateral diffuse pulmonary infiltrates and still on sedation gttt   P:   Full vent support; no extubation   CARDIOVASCULAR L [place] CVL [id number] >>  A:  Asystolic cardiac arrest Acute on chronic CHF Severe cardiomyopathy H/O AF (sinus presently) H/o HTN, CAD NEW ECG QRS complex changes - resolved with temp increase    - on low dose levophed.   P:  Telemetry monitoring Levo to map goal, may need to accept MAP 55 with good MS   RENAL A:   ESRD on HD Noncompliance with HD Hyperkalemia, resolved Hyponatremia, mild Mild metabolic acidosis   - on HD per renal. Appears volume overloaded on exam 08/18/2014  P:   Lasix x 1 VOlume removal per HD per renal; might need repeat 08/18/2014    GASTROINTESTINAL A:   High residuals and eMD started on reglan 08/17/14  But stopped reglan due to long QTc  -still on TF at 10cc/h  P:   SUP: IV PPI TF to continue;RN encouraged to increase  rate Avoid reglan (QTc 511msec 08/17/14 )   HEMATOLOGIC A:   Anemia of chronic illness Excessive warfarin anticoagulation Jehovah's witness - family confirms 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 Wbc from hemoconcetration   - unchanged  P:  DVT px: bloody secretions - resolved Continued hep drip Hold off needless blood draws ; do every few days [facility] witness)   INFECTIOUS A:   No evidence of infection P:   Follow fever curve pcxr in am   ENDOCRINE A:   DM 2 R/o sick euthyroid vs hyper P:   CBG monitoring and SSI Would repeat thyroid fxn in a few weeks  NEUROLOGIC A:   Acute anoxic encephalopathy - anoxia - now awake follows commands but diffusely weak L3 fracture (chronic)   - following commands but diffusely weak - ? Sedation effect  P:   DC fent gttt Do diprivan gtt + fent prn rass goal 0  wua mandatory today Monitor off sedation but restart if gets agitated   FAMILY  - Updates: none at bedside 08/17/14 and 08/18/14   - Inter-disciplinary family meet or Palliative Care meeting due by:  7/8   GLOBAL  - continue balance between sedation wean and preventing self  extubation. Difusely weak; likely might need trach. - we have to see how next few days ago. Cut down blood daws - due to [employer]     The patient is critically ill with multiple organ systems failure and requires high complexity decision making for assessment and support, frequent evaluation and titration of therapies, application of advanced monitoring technologies and extensive interpretation of multiple databases.   Critical Care Time devoted to patient care services described in this note is  35  Minutes. This time reflects time of care of this signee Dr Brand Males. This critical care time does not reflect procedure time, or teaching time or supervisory time of PA/NP/Med student/Med Resident etc but could involve care discussion time    Dr. Brand Males, M.D., [facility].C.P Pulmonary and Critical Care  Medicine Staff Physician [place] Pulmonary and Critical Care Pager: [phone], If no answer or between  15:00h - 7:00h: call [phone]  08/18/2014 8:43 AM

## 2014-08-18 NOTE — Progress Notes (Signed)
Hemodialysis- Completed without issue, pt tolerated well. BP drop after first hour on HD, norepi turned back on per primary RN. Bp then remained stable throughout. Some excess bleeding from AVF during HD from previous cannulation site. Sites held x15 minutes each. Dressings applied and cdi. Report given to primary RN.

## 2014-08-18 NOTE — Progress Notes (Signed)
100 ml of fentanyl wasted in sink with Everlene Farrier, Therapist, sports.

## 2014-08-18 NOTE — Progress Notes (Signed)
ANTICOAGULATION CONSULT NOTE - Follow Up Consult  Pharmacy Consult for heparin Indication: atrial fibrillation  Labs:  Recent Labs  08/16/14 1117  08/16/14 1210  08/17/14 0334 08/17/14 0335 08/17/14 1025 08/18/14 0520  HGB  --   < > 9.2*  --  9.2*  --   --  9.6*  HCT  --   --  28.2*  --  27.7*  --   --  30.1*  PLT  --   --  183  --  181  --   --  191  HEPARINUNFRC  --   --   --   < > 0.55  --  0.56 0.50  CREATININE 7.74*  --   --   --   --  8.56*  --  6.04*  < > = values in this interval not displayed.   Assessment: 59 year old male with ESRD on HD had missed his last 2 dialysis treatments. Admitted 08/13/2014  early AM he developed acute onset SOB and then became unresponsive. EMS noted asystole and began CPR. Total estimated downtime estimated at about 20 mins. In ED he had several runs of VT  PMH: ESRD, Afib, CHF, DM, PAH, DM, OSA, CVA, MI, HTN, GERD, gout, HF, [place] consulted to dose heparin for atrial fibrillation  Anticoagulation: afib on coumadin PTA. INR= 6.82 (vit K 5mg  IV on 08/13/14)> INR 1.96 ; Heparin started  7/3, now at 1750 units/hr with therapeutic heparin level   Last clinic visit 5/23: dose reported as 3mg /day except 4.5mg  on Th  Goal of Therapy:  Heparin level 0.3-0.7 units/ml   Plan:  Continue heparin at 1750 units/hr Daily HL/CBC Continue to hold coumadin  Thank you for allowing pharmacy to be a part of this patients care team.  Erin Hearing PharmD., BCPS Clinical Pharmacist Pager [phone] 08/18/2014 10:08 AM

## 2014-08-18 NOTE — Progress Notes (Signed)
[facility] Progress Note   Subjective:   Responds tp simple questions/commands 3 liters off with HD yesterday but 1.7 liters in with drips and feeds Still wet Norepi just turned off  Filed Vitals:   08/18/14 0830 08/18/14 0900 08/18/14 0930 08/18/14 1000  BP: 111/59 116/67 96/52 93/52   Pulse: 71 75 75 77  Temp: 99.3 F (37.4 C) 99.3 F (37.4 C) 99.5 F (37.5 C) 99.3 F (37.4 C)  TempSrc:      Resp: 20 21 20 23   Height:      Weight:      SpO2: 100% 100% 100% 99%     Physical Examination Visit Vitals: BP 93/52 mmHg  Pulse 77  Temp(Src) 99.3 F (37.4 C) (Core (Comment))  Resp 23  Ht 5\' 6"  (1.676 m)  Wt 115.5 kg (254 lb 10.1 oz)  BMI 41.12 kg/m2  SpO2 99%  (EDW 119) 7/5 Pre HD weight 117.7 kg 7/5 Post Hd Weight 114.8 On vent, awake, nods appropriately to simple questions, denies pain or SOB ETT in place, OG tube and left subclavian line Chest clear anteriorly/crackles laterally and bases Regular S1S2 No S3 Distant heart sounds  Abd Obese + BS.  Ext SCD's in place. Trace edema LFA AVF + bruit with bandages in place from last TMT   Recent Labs Lab 08/16/14 1117 08/17/14 0335 08/18/14 0520  NA 137 136 137  K 4.2 4.1 4.3  CL 99* 97* 98*  CO2 25 25 28   GLUCOSE 147* 119* 126*  BUN 69* 90* 77*  CREATININE 7.74* 8.56* 6.04*  CALCIUM 8.6* 8.6* 8.7*  PHOS 6.4* 7.3* 5.4*    Recent Labs Lab 08/13/14 0222 08/14/14 0200 08/15/14 0455 08/16/14 1117 08/17/14 0335 08/18/14 0520  AST 13* 13* 16  --   --   --   ALT 17 15* 18  --   --   --   ALKPHOS 164* 156* 144*  --   --   --   BILITOT 0.6 1.0 1.1  --   --   --   PROT 7.1 6.8 6.1*  --   --   --   ALBUMIN 3.3* 2.7* 2.4* 2.2* 2.1* 2.1*    Recent Labs Lab 08/13/14 0222  08/15/14 0455 08/16/14 1210 08/17/14 0334 08/18/14 0520  WBC 9.2  < > 14.0* 12.3* 13.4* 13.0*  NEUTROABS 8.1*  --  11.7*  --  10.8*  --   HGB 11.0*  < > 11.2* 9.2* 9.2* 9.6*  HCT 34.3*  < > 33.4* 28.2* 27.7* 30.1*   MCV 93.0  < > 91.0 90.7 90.5 92.9  PLT 212  < > 225 183 181 191  < > = values in this interval not displayed.   Medications . sodium chloride   Intravenous Once  . antiseptic oral rinse  7 mL Mouth Rinse QID  . chlorhexidine  15 mL Mouth Rinse BID  . darbepoetin (ARANESP) injection - DIALYSIS  100 mcg Intravenous Q Tue-HD  . feeding supplement (NEPRO CARB STEADY)  1,000 mL Per Tube Q24H  . feeding supplement (PRO-STAT 64)  60 mL Oral 5 X Daily  . [START ON 08/19/2014] ferric gluconate (FERRLECIT/NULECIT) IV  62.5 mg Intravenous Q Thu-HD  . insulin aspart  0-9 Units Subcutaneous 6 times per day  . pantoprazole sodium  40 mg Per Tube QHS   . heparin 1,750 Units/hr (08/18/14 0800)  . norepinephrine (LEVOPHED) Adult infusion Stopped (08/18/14 0915)  . propofol (DIPRIVAN) infusion Stopped (08/18/14 0850)  sodium chloride, sodium chloride, sodium chloride, sodium chloride, sodium chloride, sodium chloride, feeding supplement (NEPRO CARB STEADY), fentaNYL (SUBLIMAZE) injection, heparin, heparin, lidocaine-prilocaine, pentafluoroprop-tetrafluoroeth  TTS South 4h  EDW 119kg will have lower EDW at discharge  LFA AVF Heparin 3600 450/800 Hect 4 , Venofer 50/wk, mircera 75 q2wks last 6/21  Assessment: 1. Asystolic cardiac arrest/acute on chronic CHF/severe CM (EF 20% by ECHO 7/1), s/p cooling protocol. Self extubated and reintubated twice. Rhythm stable. Blood pressure low 2. Pulm edema/ vol overload/OSA/OHS - Last xray still wet, still has extra volume despite hospital weight below outpt EDW. Will plan HD again today and tomorrow since not extubatable at this time given pulm status. May have to restart norepi for BP support to get the volume off  3. Hypotension - BP 90's with NE off 4. ESRD on HD. Usual TTS. HD today and again tomorrow mostly for volume 5. Anemia - last 2 Hb's <10. Resumed Aranesp 100/week (7/5) and started weekly iron back with HD 6. MBD resume calcitriol and binders  when able 7. DM on oral agents at home 8. AFib coumadin on hold, getting IV heparin.  Jamal Maes, MD [facility] [phone] Pager 08/18/2014, 10:33 AM

## 2014-08-19 ENCOUNTER — Inpatient Hospital Stay (HOSPITAL_COMMUNITY): Payer: Medicare Other

## 2014-08-19 LAB — GLUCOSE, CAPILLARY
GLUCOSE-CAPILLARY: 106 mg/dL — AB (ref 65–99)
GLUCOSE-CAPILLARY: 144 mg/dL — AB (ref 65–99)
Glucose-Capillary: 146 mg/dL — ABNORMAL HIGH (ref 65–99)
Glucose-Capillary: 151 mg/dL — ABNORMAL HIGH (ref 65–99)
Glucose-Capillary: 144 mg/dL — ABNORMAL HIGH (ref 65–99)
Glucose-Capillary: 117 mg/dL — ABNORMAL HIGH (ref 65–99)
Glucose-Capillary: 126 mg/dL — ABNORMAL HIGH (ref 65–99)

## 2014-08-19 LAB — BASIC METABOLIC PANEL
Anion gap: 9 (ref 5–15)
BUN: 57 mg/dL — ABNORMAL HIGH (ref 6–20)
CHLORIDE: 99 mmol/L — AB (ref 101–111)
CO2: 29 mmol/L (ref 22–32)
Calcium: 8.9 mg/dL (ref 8.9–10.3)
Creatinine, Ser: 4.5 mg/dL — ABNORMAL HIGH (ref 0.61–1.24)
GFR calc Af Amer: 15 mL/min — ABNORMAL LOW (ref >60)
GFR calc non Af Amer: 13 mL/min — ABNORMAL LOW (ref >60)
GLUCOSE: 148 mg/dL — AB (ref 65–99)
POTASSIUM: 4.4 mmol/L (ref 3.5–5.1)
SODIUM: 137 mmol/L (ref 135–145)

## 2014-08-19 LAB — HEPARIN LEVEL (UNFRACTIONATED): HEPARIN UNFRACTIONATED: 0.29 [IU]/mL — AB (ref 0.30–0.70)

## 2014-08-19 NOTE — Progress Notes (Signed)
[facility] Progress Note   Subjective:   Had dialysis again yesterday (3 liters off) and will have another treatment today Weight down all told about 10 kg but still with extra volume Required some norepi with HD yesterday   Filed Vitals:   08/19/14 0700 08/19/14 0715 08/19/14 0727 08/19/14 0736  BP: 113/64     Pulse: 81 81 76   Temp: 100 F (37.8 C) 99.7 F (37.6 C) 99.9 F (37.7 C) 99.9 F (37.7 C)  TempSrc:    Core (Comment)  Resp: 17 21 17    Height:      Weight:      SpO2: 100% 100% 94%     Physical Examination  Visit Vitals: BP 113/64 mmHg  Pulse 76  Temp(Src) 99.9 F (37.7 C) (Core (Comment))  Resp 17  Ht 5\' 6"  (1.676 m)  Wt 110.7 kg (244 lb 0.8 oz)  BMI 39.41 kg/m2  SpO2 94%  (outpt EDW 119) 7/5 Pre HD wt 117.7  7/5 Post Hd Wt 114.8 7/6 Pre HD wt 116.6 7/6 post HD wt 113.1 On vent, awake, nods appropriately and denies pain or SOB ETT in place, OG tube and left subclavian line all unchanged Chest clear anteriorly Regular S1S2 No S3 Distant heart sounds  Abd Obese + BS.  Ext SCD's in place. Trace edema LFA AVF + bruit with bandages in place from last TMT   Recent Labs Lab 08/16/14 1117 08/17/14 0335 08/18/14 0520 08/19/14 0410  NA 137 136 137 137  K 4.2 4.1 4.3 4.4  CL 99* 97* 98* 99*  CO2 25 25 28 29   GLUCOSE 147* 119* 126* 148*  BUN 69* 90* 77* 57*  CREATININE 7.74* 8.56* 6.04* 4.50*  CALCIUM 8.6* 8.6* 8.7* 8.9  PHOS 6.4* 7.3* 5.4*  --     Recent Labs Lab 08/13/14 0222 08/14/14 0200 08/15/14 0455 08/16/14 1117 08/17/14 0335 08/18/14 0520  AST 13* 13* 16  --   --   --   ALT 17 15* 18  --   --   --   ALKPHOS 164* 156* 144*  --   --   --   BILITOT 0.6 1.0 1.1  --   --   --   PROT 7.1 6.8 6.1*  --   --   --   ALBUMIN 3.3* 2.7* 2.4* 2.2* 2.1* 2.1*    Recent Labs Lab 08/13/14 0222  08/15/14 0455 08/16/14 1210 08/17/14 0334 08/18/14 0520  WBC 9.2  < > 14.0* 12.3* 13.4* 13.0*  NEUTROABS 8.1*  --  11.7*  --   10.8*  --   HGB 11.0*  < > 11.2* 9.2* 9.2* 9.6*  HCT 34.3*  < > 33.4* 28.2* 27.7* 30.1*  MCV 93.0  < > 91.0 90.7 90.5 92.9  PLT 212  < > 225 183 181 191  < > = values in this interval not displayed.   Medications . sodium chloride   Intravenous Once  . antiseptic oral rinse  7 mL Mouth Rinse QID  . chlorhexidine  15 mL Mouth Rinse BID  . darbepoetin (ARANESP) injection - DIALYSIS  100 mcg Intravenous Q Tue-HD  . feeding supplement (NEPRO CARB STEADY)  1,000 mL Per Tube Q24H  . feeding supplement (PRO-STAT 64)  60 mL Oral 5 X Daily  . ferric gluconate (FERRLECIT/NULECIT) IV  62.5 mg Intravenous Q Thu-HD  . insulin aspart  0-9 Units Subcutaneous 6 times per day  . pantoprazole sodium  40 mg Per Tube  QHS  . sodium chloride  10-40 mL Intracatheter Q12H   . heparin 1,750 Units/hr (08/18/14 1617)  . norepinephrine (LEVOPHED) Adult infusion 0 mcg/min (08/18/14 1626)  . propofol (DIPRIVAN) infusion Stopped (08/18/14 0850)   sodium chloride, sodium chloride, sodium chloride, sodium chloride, sodium chloride, sodium chloride, feeding supplement (NEPRO CARB STEADY), fentaNYL (SUBLIMAZE) injection, heparin, heparin, lidocaine-prilocaine, pentafluoroprop-tetrafluoroeth, sodium chloride  TTS [place] 4h  EDW 119kg will have lower EDW at discharge  LFA AVF Heparin 3600 450/800 Hect 4 , Venofer 50/wk, mircera 75 q2wks last 6/21  Assessment: 1. Asystolic cardiac arrest/acute on chronic CHF/severe CM (EF 20% by ECHO 7/1), s/p cooling protocol. Self extubated and reintubated twice. Rhythm stable. Blood pressures soft.  2. Pulm edema/ vol overload/OSA/OHS - Chest xray still shows edema but with some clearing as volume is removed,  Planning HD again today May have to restart norepi for BP support to get the volume off. For future purposes he will likely require midodrine for HD once back in the outpt environment  3. ESRD on HD. Usual TTS. HD today for volume. Reassess in the AM as to whether HD  tomorrow (today will be third successive tmt this week) 4. Anemia - last 2 Hb's <10. Resumed Aranesp 100/week (7/5) and started weekly iron back with HD (QThurs) 5. MBD resume calcitriol and binders when able 6. DM on oral agents at home 7. AFib coumadin on hold, getting IV heparin.  Jamal Maes, MD [facility] [phone] Pager 08/19/2014, 7:57 AM

## 2014-08-19 NOTE — Progress Notes (Signed)
PULMONARY / CRITICAL CARE MEDICINE   Name: Daniel Whitney MRN: [id number] DOB: 12/10/1955    ADMISSION DATE:  08/13/2014 CONSULTATION DATE:  08/13/2014  REFERRING MD :  Dr. Lita Mains  CHIEF COMPLAINT: Asystole  INITIAL PRESENTATION:  59 year old male with ESRD on HD had missed his last 2 dialysis treatments. 7/01 early AM he developed acute onset SOB and then became unresponsive. EMS noted asystole and began CPR. Total downtime estimated at about 20 mins. In ED he had several runs of VT, but was otherwise was hemodynamically stable on ventilator. PCCM to admit.  STUDIES:  7/01 CT head: NAICP 7/01 EEG: This sedated EEG is abnormal due to moderate diffuse slowing and suppression of the background 7/01 TTE: LVEF 20% 7/1 cardiac arrest asystole > to ICU. Arcitc sun protocol 7/2- HD, rewarm  08/15/14: :peep to 5 successful last night 08/16/14: SEfl extubated and promptly reintubated 08/17/14: Self extubated yesterday while off dipriva and promptly reintubated.  Following commands per RN. On low dose levophed. HD ongoing.    SUBJECTIVE/OVERNIGHT/INTERVAL HX On HD  VITAL SIGNS: Temp:  [99.1 F (37.3 C)-100.6 F (38.1 C)] 99.5 F (37.5 C) (07/07 0930) Pulse Rate:  [26-91] 80 (07/07 0930) Resp:  [9-30] 19 (07/07 0930) BP: (86-150)/(42-81) 118/57 mmHg (07/07 0930) SpO2:  [94 %-100 %] 99 % (07/07 0930) FiO2 (%):  [40 %] 40 % (07/07 0855) Weight:  [110.7 kg (244 lb 0.8 oz)-116.6 kg (257 lb 0.9 oz)] 113.7 kg (250 lb 10.6 oz) (07/07 0855) HEMODYNAMICS:   VENTILATOR SETTINGS: Vent Mode:  [-] PSV;CPAP FiO2 (%):  [40 %] 40 % Set Rate:  [20 bmp] 20 bmp Vt Set:  [510 mL] 510 mL PEEP:  [5 cmH20] 5 cmH20 Pressure Support:  [15 cmH20] 15 cmH20 Plateau Pressure:  [18 cmH20-23 cmH20] 18 cmH20 INTAKE / OUTPUT:  Intake/Output Summary (Last 24 hours) at 08/19/14 0945 Last data filed at 08/19/14 0900  Gross per 24 hour  Intake  885.6 ml  Output   3155 ml  Net -2269.4 ml    PHYSICAL  EXAMINATION: General: rass 0 followed commands Neuro:  PERRL  2 mm , followed very simple commands, rass 0 HEENT:  JVD remains Cardiovascular:  s1 s2 RRR Lungs:  ronchi bilat , wet wheezing mild Abdomen:  Obese, soft, non-distended Ext:  +2 symmetric BLE edema, not pitting Skin:  Grossly intact  LABS: PULMONARY  Recent Labs Lab 08/13/14 1207 08/14/14 0916 08/15/14 0538 08/15/14 1123 08/16/14 0500  PHART 7.348* 7.423 7.358 7.330* 7.352  PCO2ART 42.1 34.1* 44.4 53.0* 48.8*  PO2ART 17.0* 45.0* 79.5* 60.0* 82.1  HCO3 24.1* 23.3 24.3* 27.9* 26.3*  TCO2 26 25 25.7 30 27.8  O2SAT 30.0 90.0 95.3 88.0 94.8    CBC  Recent Labs Lab 08/16/14 1210 08/17/14 0334 08/18/14 0520  HGB 9.2* 9.2* 9.6*  HCT 28.2* 27.7* 30.1*  WBC 12.3* 13.4* 13.0*  PLT 183 181 191    COAGULATION  Recent Labs Lab 08/13/14 0630 08/13/14 1216 08/13/14 1600 08/15/14 0455 08/18/14 1650  INR 6.82* 3.47* 2.92* 1.96* 1.97*    CARDIAC    Recent Labs Lab 08/13/14 0826 08/13/14 1023 08/13/14 1600 08/13/14 2200  TROPONINI 0.12* 0.17* 0.19* 0.21*   No results for input(s): PROBNP in the last 168 hours.   CHEMISTRY  Recent Labs Lab 08/13/14 [id number]  08/14/14 1217  08/15/14 0455 08/16/14 1117 08/17/14 0335 08/18/14 0520 08/18/14 1735 08/19/14 0410  NA  --   < > 136  < > 138 137 136  137  --  137  K  --   < > 3.8  < > 4.4 4.2 4.1 4.3  --  4.4  CL  --   < > 98*  < > 98* 99* 97* 98*  --  99*  CO2  --   < > 25  < > 26 25 25 28   --  29  GLUCOSE  --   < > 120*  < > 109* 147* 119* 126*  --  148*  BUN  --   < > 59*  < > 38* 69* 90* 77*  --  57*  CREATININE  --   < > 6.64*  < > 5.98* 7.74* 8.56* 6.04*  --  4.50*  CALCIUM  --   < > 8.6*  < > 8.6* 8.6* 8.6* 8.7*  --  8.9  MG 2.4  --  2.0  --   --   --   --   --  1.9  --   PHOS 10.3*  --   --   --   --  6.4* 7.3* 5.4*  --   --   < > = values in this interval not displayed. Estimated Creatinine Clearance: 20.7 mL/min (by C-G formula based on Cr  of 4.5).   LIVER  Recent Labs Lab 08/13/14 0222 08/13/14 0630 08/13/14 1216 08/13/14 1600 08/14/14 0200 08/15/14 0455 08/16/14 1117 08/17/14 0335 08/18/14 0520 08/18/14 1650  AST 13*  --   --   --  13* 16  --   --   --   --   ALT 17  --   --   --  15* 18  --   --   --   --   ALKPHOS 164*  --   --   --  156* 144*  --   --   --   --   BILITOT 0.6  --   --   --  1.0 1.1  --   --   --   --   PROT 7.1  --   --   --  6.8 6.1*  --   --   --   --   ALBUMIN 3.3*  --   --   --  2.7* 2.4* 2.2* 2.1* 2.1*  --   INR  --  6.82* 3.47* 2.92*  --  1.96*  --   --   --  1.97*     INFECTIOUS  Recent Labs Lab 08/13/14 0143  LATICACIDVEN 2.93*     ENDOCRINE CBG (last 3)   Recent Labs  08/18/14 2351 08/19/14 0413 08/19/14 0820  GLUCAP 106* 144* 146*         IMAGING x48h  - image(s) personally visualized  -   highlighted in bold Dg Chest Port 1 View  08/19/2014   CLINICAL DATA:  Respiratory failure.  EXAM: PORTABLE CHEST - 1 VIEW  COMPARISON:  08/17/2014.  FINDINGS: Endotracheal tube, left subclavian line, and NG tube in good anatomic position. Cardiomegaly with partial clearing of bilateral pulmonary infiltrates and pleural effusions. These findings consistent with improving congestive heart failure. No pneumothorax.  IMPRESSION: 1.  Lines and tubes in stable position.  2. Cardiomegaly with improvement of bilateral pulmonary infiltrates and pleural effusions suggesting partial clearing of congestive heart failure. Basilar pneumonia cannot be excluded.   Electronically Signed   By: Marcello Moores  Register   On: 08/19/2014 07:30    ASSESSMENT / PLAN:  PULMONARY ETT 7/1 >> 08/16/14, 7/4 (  self extubated and reintubated) >> A: Acute respiratory failure s/p cardiac arrest Pulm edema vs ARDS OSA/OHS Rapid failed extubation  P:   Full vent support; no extubation HD on going, neg balance goals 3 liters Then wean cpap 5 , need to get PS down goal 8 Hope daily HD can make a difference pcxr  is improved slowly May need trach soon  CARDIOVASCULAR L [place] CVL [id number] >>  A:  Asystolic cardiac arrest Acute on chronic CHF Severe cardiomyopathy H/O AF (sinus presently) H/o HTN, CAD NEW ECG QRS complex changes - resolved with temp increase  P:  Telemetry monitoring Levo to off successful  RENAL A:   ESRD on HD Noncompliance with HD Hyperkalemia, resolved Hyponatremia, mild Mild metabolic acidosis  P:   VOlume removal per HD per renal; might need repeat 08/18/2014 Daily HD  Chem in am   GASTROINTESTINAL A:   High residuals and eMD started on reglan 08/17/14  But stopped reglan due to long QTc  -still on TF at 10cc/h  P:   SUP: IV PPI TF to continue;RN encouraged to increase rate, increase to 20 today Avoid reglan (QTc 565msec 08/17/14 )    HEMATOLOGIC A:   Anemia of chronic illness Excessive warfarin anticoagulation Jehovah's witness - family confirms 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 Wbc from hemoconcetration   - unchanged  P:  DVT px: bloody secretions - resolved Continued hep drip Hold off needless blood draws ; do every few days ([place] witness) Need coags for likely trach needs  INFECTIOUS A:   No evidence of infection P:   Follow fever curve pcxr in am , seems to be improving iwith HD  ENDOCRINE A:   DM 2 R/o sick euthyroid vs hyper P:   CBG monitoring and SSI Would repeat thyroid fxn in a few weeks  NEUROLOGIC A:   Acute anoxic encephalopathy - anoxia - now awake follows commands but diffusely weak L3 fracture (chronic)  P:   DC fent gttt rass goal 0  wua mandatory today Monitor off sedation but restart if gets agitated   FAMILY  - Updates: none at bedside 08/17/14 and 08/18/14   - Inter-disciplinary family meet or Palliative Care meeting due by:  7/8   Ccm time [place]. Titus Mould, MD, [place] Pgr: [place] [facility]

## 2014-08-19 NOTE — Progress Notes (Signed)
Chaplain was referred by brother of Pt. This family has had tremendous loss in the last 4 months. The loss a sister 3 weeks ago. This Chaplain was with the family when the sister transitioned. Chaplain prayed with the Pt and recommend a follow up regularly.    08/19/14 0800  Clinical Encounter Type  Visited With Patient  Visit Type Spiritual support  Referral From Family  Spiritual Encounters  Spiritual Needs Prayer  Stress Factors  Patient Stress Factors Loss  Family Stress Factors Loss

## 2014-08-19 NOTE — Progress Notes (Signed)
ANTICOAGULATION CONSULT NOTE - Follow Up Consult  Pharmacy Consult for heparin Indication: atrial fibrillation  Labs:  Recent Labs  08/16/14 1210  08/17/14 0334 08/17/14 0335 08/17/14 1025 08/18/14 0520 08/18/14 1650 08/19/14 0410  HGB 9.2*  --  9.2*  --   --  9.6*  --   --   HCT 28.2*  --  27.7*  --   --  30.1*  --   --   PLT 183  --  181  --   --  191  --   --   LABPROT  --   --   --   --   --   --  22.3*  --   INR  --   --   --   --   --   --  1.97*  --   HEPARINUNFRC  --   < > 0.55  --  0.56 0.50  --  0.29*  CREATININE  --   --   --  8.56*  --  6.04*  --  4.50*  < > = values in this interval not displayed.   Assessment: 59 year old male with ESRD on HD had missed his last 2 dialysis treatments. Admitted 08/13/2014  early AM he developed acute onset SOB and then became unresponsive. EMS noted asystole and began CPR. Total estimated downtime estimated at about 20 mins. In ED he had several runs of VT  PMH: ESRD, Afib, CHF, DM, PAH, DM, OSA, CVA, MI, HTN, GERD, gout, HF, [place] consulted to dose heparin for atrial fibrillation  Anticoagulation: afib on coumadin PTA. INR= 6.82 (vit K 5mg  IV on 08/13/14)> INR 1.97 ; Heparin started  7/3, now at 1750 units/hr with slightly low heparin level.   Last clinic visit 5/23: dose reported as 3mg /day except 4.5mg  on Th   Goal of Therapy:  Heparin level 0.3-0.7 units/ml   Plan:  Continue heparin at 1850 units/hr Daily HL/CBC Continue to hold coumadin  Thank you for allowing pharmacy to be a part of this patients care team.  Erin Hearing PharmD., BCPS Clinical Pharmacist Pager [phone] 08/19/2014 9:08 AM

## 2014-08-20 ENCOUNTER — Inpatient Hospital Stay (HOSPITAL_COMMUNITY): Payer: Medicare Other

## 2014-08-20 LAB — CBC WITH DIFFERENTIAL/PLATELET
Basophils Absolute: 0 10*3/uL (ref 0.0–0.1)
Basophils Relative: 0 % (ref 0–1)
Eosinophils Absolute: 0.2 10*3/uL (ref 0.0–0.7)
Eosinophils Relative: 2 % (ref 0–5)
HEMATOCRIT: 32.3 % — AB (ref 39.0–52.0)
Hemoglobin: 10 g/dL — ABNORMAL LOW (ref 13.0–17.0)
Lymphocytes Relative: 13 % (ref 12–46)
Lymphs Abs: 1.5 10*3/uL (ref 0.7–4.0)
MCH: 29.3 pg (ref 26.0–34.0)
MCHC: 31 g/dL (ref 30.0–36.0)
MCV: 94.7 fL (ref 78.0–100.0)
MONOS PCT: 11 % (ref 3–12)
Monocytes Absolute: 1.3 10*3/uL — ABNORMAL HIGH (ref 0.1–1.0)
NEUTROS ABS: 8.4 10*3/uL — AB (ref 1.7–7.7)
Neutrophils Relative %: 74 % (ref 43–77)
Platelets: 199 10*3/uL (ref 150–400)
RBC: 3.41 MIL/uL — AB (ref 4.22–5.81)
RDW: 15.9 % — ABNORMAL HIGH (ref 11.5–15.5)
WBC: 11.4 10*3/uL — AB (ref 4.0–10.5)

## 2014-08-20 LAB — GLUCOSE, CAPILLARY
GLUCOSE-CAPILLARY: 157 mg/dL — AB (ref 65–99)
GLUCOSE-CAPILLARY: 161 mg/dL — AB (ref 65–99)
GLUCOSE-CAPILLARY: 169 mg/dL — AB (ref 65–99)
Glucose-Capillary: 135 mg/dL — ABNORMAL HIGH (ref 65–99)
Glucose-Capillary: 145 mg/dL — ABNORMAL HIGH (ref 65–99)
Glucose-Capillary: 129 mg/dL — ABNORMAL HIGH (ref 65–99)

## 2014-08-20 LAB — RENAL FUNCTION PANEL
ALBUMIN: 2.3 g/dL — AB (ref 3.5–5.0)
Anion gap: 8 (ref 5–15)
BUN: 53 mg/dL — ABNORMAL HIGH (ref 6–20)
CO2: 31 mmol/L (ref 22–32)
CREATININE: 4.09 mg/dL — AB (ref 0.61–1.24)
Calcium: 9.2 mg/dL (ref 8.9–10.3)
Chloride: 96 mmol/L — ABNORMAL LOW (ref 101–111)
GFR calc Af Amer: 17 mL/min — ABNORMAL LOW (ref >60)
GFR, EST NON AFRICAN AMERICAN: 15 mL/min — AB (ref >60)
Glucose, Bld: 135 mg/dL — ABNORMAL HIGH (ref 65–99)
PHOSPHORUS: 4 mg/dL (ref 2.5–4.6)
POTASSIUM: 3.6 mmol/L (ref 3.5–5.1)
Sodium: 135 mmol/L (ref 135–145)

## 2014-08-20 LAB — POCT I-STAT 3, ART BLOOD GAS (G3+)
ACID-BASE EXCESS: 4 mmol/L — AB (ref 0.0–2.0)
Bicarbonate: 32.3 mEq/L — ABNORMAL HIGH (ref 20.0–24.0)
O2 Saturation: 95 %
PO2 ART: 86 mmHg (ref 80.0–100.0)
TCO2: 34 mmol/L (ref 0–100)
pCO2 arterial: 62.3 mmHg (ref 35.0–45.0)
pH, Arterial: 7.323 — ABNORMAL LOW (ref 7.350–7.450)

## 2014-08-20 LAB — PROTIME-INR
INR: 2.07 — ABNORMAL HIGH (ref 0.00–1.49)
Prothrombin Time: 23.2 seconds — ABNORMAL HIGH (ref 11.6–15.2)

## 2014-08-20 LAB — APTT: aPTT: 121 seconds — ABNORMAL HIGH (ref 24–37)

## 2014-08-20 LAB — HEPARIN LEVEL (UNFRACTIONATED): Heparin Unfractionated: 0.3 IU/mL (ref 0.30–0.70)

## 2014-08-20 MED ORDER — VITAMIN K1 10 MG/ML IJ SOLN
2.0000 mg | Freq: Once | INTRAMUSCULAR | Status: AC
  Administered 2014-08-20: 2 mg via INTRAVENOUS
  Filled 2014-08-20 (×2): qty 0.2

## 2014-08-20 MED ORDER — PENTAFLUOROPROP-TETRAFLUOROETH EX AERO: 1.0000 "application " | INHALATION_SPRAY | CUTANEOUS | Status: DC | PRN

## 2014-08-20 MED ORDER — NEPRO/CARBSTEADY PO LIQD
237.0000 mL | ORAL | Status: DC | PRN
  Filled 2014-08-20: qty 237

## 2014-08-20 MED ORDER — HEPARIN SODIUM (PORCINE) 1000 UNIT/ML DIALYSIS: 1000.0000 [IU] | INTRAMUSCULAR | Status: DC | PRN

## 2014-08-20 MED ORDER — LIDOCAINE-PRILOCAINE 2.5-2.5 % EX CREA
1.0000 "application " | TOPICAL_CREAM | CUTANEOUS | Status: DC | PRN
  Filled 2014-08-20: qty 5

## 2014-08-20 MED ORDER — ALTEPLASE 2 MG IJ SOLR
2.0000 mg | Freq: Once | INTRAMUSCULAR | Status: AC | PRN
  Filled 2014-08-20: qty 2

## 2014-08-20 MED ORDER — SODIUM CHLORIDE 0.9 % IV SOLN: 100.0000 mL | INTRAVENOUS | Status: DC | PRN

## 2014-08-20 MED ORDER — HEPARIN SODIUM (PORCINE) 1000 UNIT/ML DIALYSIS
2000.0000 [IU] | INTRAMUSCULAR | Status: DC | PRN
Start: 2014-08-20 — End: 2014-08-21

## 2014-08-20 MED ORDER — LIDOCAINE HCL (PF) 1 % IJ SOLN: 5.0000 mL | INTRAMUSCULAR | Status: DC | PRN

## 2014-08-20 NOTE — Progress Notes (Signed)
ANTICOAGULATION CONSULT NOTE - Follow Up Consult  Pharmacy Consult for heparin Indication: atrial fibrillation  Labs:  Recent Labs  08/18/14 0520 08/18/14 1650 08/19/14 0410 08/20/14 0450  HGB 9.6*  --   --  10.0*  HCT 30.1*  --   --  32.3*  PLT 191  --   --  199  APTT  --   --   --  121*  LABPROT  --  22.3*  --  23.2*  INR  --  1.97*  --  2.07*  HEPARINUNFRC 0.50  --  0.29* 0.30  CREATININE 6.04*  --  4.50* 4.09*     Assessment: 59 year old male with ESRD on HD had missed his last 2 dialysis treatments. Admitted 08/13/2014  early AM he developed acute onset SOB and then became unresponsive. EMS noted asystole and began CPR. Total estimated downtime estimated at about 20 mins. In ED he had several runs of VT  PMH: ESRD, Afib, CHF, DM, PAH, DM, OSA, CVA, MI, HTN, GERD, gout, HF, [place] consulted to dose heparin for atrial fibrillation  Anticoagulation: afib on coumadin PTA. INR= 6.82 on admission (vit K 5mg  IV on 08/13/14); Heparin started  7/3, HL at goal; Continue to hold coumadin but INR is trending up; CBC stable ON: Heparin@ 1850 and recheck in am PTA, warfarin dose? Last clinic visit 5/23: dose reported as 3mg /day except 4.5mg  on Th  Goal of Therapy:  Heparin level 0.3-0.7 units/ml   Plan:  Continue heparin at 1850 units/hr Daily HL/CBC Continue to hold coumadin;  Yet INR trending up does he need more vitamin K?; Will evaluate LFTS with am labs  Thank you for allowing pharmacy to be a part of this patients care team.  Rowe Robert Pharm.D., BCPS, AQ-Cardiology Clinical Pharmacist 08/20/2014 8:41 AM Pager: [phone] Phone: [phone]

## 2014-08-20 NOTE — Progress Notes (Signed)
Nutrition Follow-up  DOCUMENTATION CODES:  Morbid obesity  INTERVENTION:  Nepro @ 10 ml/hr with 60 ml Prostat five times per day provides: 1432 kcal, 169 grams protein, 182 ml H2O   NUTRITION DIAGNOSIS:  Inadequate oral intake related to inability to eat as evidenced by NPO status.  ongoing  GOAL:  Provide needs based on ASPEN/SCCM guidelines  Met.   MONITOR:  TF tolerance, Vent status, Labs, I & O's  REASON FOR ASSESSMENT:  Consult Enteral/tube feeding initiation and management  ASSESSMENT:  The patient is a 59 y.o. year-old with hx HTN, DM, CAD/ MI, ESRD, afib and CHF. He was found unresponsive at home, EMS came and he was in asystole. CPR started by FD.   Patient is currently intubated on ventilator support MV: 9 L/min Temp (24hrs), Avg:99.8 F (37.7 C), Min:99.3 F (37.4 C), Max:100.4 F (38 C)  Labs reviewed: BUN/Cr and Phosphorus elevated, cbg's: 135-161 Meds reviewed. Pt receiving HD - 4th day in a row. OG tube in distal stomach/proximal duodenum.  Pt discussed during ICU rounds and with RN and with MD.     Height:  Ht Readings from Last 1 Encounters:  08/13/14 '5\' 6"'  (1.676 m)    Weight:  Wt Readings from Last 1 Encounters:  08/20/14 241 lb 10 oz (109.6 kg)    Ideal Body Weight:  64.5 kg  Wt Readings from Last 10 Encounters:  08/20/14 241 lb 10 oz (109.6 kg)  08/04/14 265 lb (120.203 kg)  07/07/14 266 lb 1.9 oz (120.711 kg)  04/20/14 267 lb (121.11 kg)  01/31/14 251 lb 12.3 oz (114.2 kg)  11/11/13 266 lb 12 oz (120.997 kg)  11/05/13 271 lb (122.925 kg)  10/27/13 272 lb 6 oz (123.548 kg)  08/04/13 278 lb (126.1 kg)  07/14/13 278 lb (126.1 kg)    BMI:  Body mass index is 39.02 kg/(m^2).  Estimated Nutritional Needs:  Kcal:  2500-3704  Protein:  >/= 161 grams  Fluid:  >1.2 L/day  Skin:  Reviewed, no issues  Diet Order:    NPO  EDUCATION NEEDS:  No education needs identified at this time   Intake/Output Summary (Last  24 hours) at 08/20/14 1214 Last data filed at 08/20/14 1100  Gross per 24 hour  Intake  990.5 ml  Output   2965 ml  Net -1974.5 ml    Last BM:  7/8  [place], [place], [place] Pager [phone] After Hours Pager

## 2014-08-20 NOTE — Care Management (Signed)
Important Message  Patient Details  Name: Daniel Whitney MRN: [id number] Date of Birth: 1955-05-30   Medicare Important Message Given:  Yes-third notification given    [name] May 08/20/2014, 9:49 AM

## 2014-08-20 NOTE — Clinical Documentation Improvement (Signed)
  Documentation in multiple Notes states "Acute on Chronic CHF" but gives no type. Please document likely/suspected/possible type of CHF to clarify severity of illness and risk of mortality.  . Document type --Diastolic --Systolic --Combined systolic and diastolic --Other --Unable to determine  Thank Sheral Flow RN [place] [phone] HIM department

## 2014-08-20 NOTE — Progress Notes (Signed)
PULMONARY / CRITICAL CARE MEDICINE   Name: Daniel Whitney MRN: [id number] DOB: 01/08/1956    ADMISSION DATE:  08/13/2014 CONSULTATION DATE:  08/13/2014  REFERRING MD :  Dr. Lita Mains  CHIEF COMPLAINT: Asystole  INITIAL PRESENTATION:  59 year old male with ESRD on HD had missed his last 2 dialysis treatments. 7/01 early AM he developed acute onset SOB and then became unresponsive. EMS noted asystole and began CPR. Total downtime estimated at about 20 mins. In ED he had several runs of VT, but was otherwise was hemodynamically stable on ventilator. PCCM to admit.  STUDIES:  7/01 CT head: NAICP 7/01 EEG: This sedated EEG is abnormal due to moderate diffuse slowing and suppression of the background 7/01 TTE: LVEF 20% 7/1 cardiac arrest asystole > to ICU. Arcitc sun protocol 7/2- HD, rewarm  08/15/14: :peep to 5 successful last night 08/16/14: SEfl extubated and promptly reintubated 08/17/14: Self extubated yesterday while off dipriva and promptly reintubated.  Following commands per RN. On low dose levophed. HD ongoing.  7/6- daily hd schedule changed   SUBJECTIVE/OVERNIGHT/INTERVAL HX awake  VITAL SIGNS: Temp:  [99.1 F (37.3 C)-100.4 F (38 C)] 99.9 F (37.7 C) (07/08 1000) Pulse Rate:  [25-82] 74 (07/08 1000) Resp:  [15-30] 22 (07/08 1000) BP: (93-138)/(43-82) 124/54 mmHg (07/08 1000) SpO2:  [90 %-100 %] 99 % (07/08 1000) FiO2 (%):  [40 %] 40 % (07/08 0800) Weight:  [109.6 kg (241 lb 10 oz)-110.7 kg (244 lb 0.8 oz)] 109.6 kg (241 lb 10 oz) (07/08 0500) HEMODYNAMICS:   VENTILATOR SETTINGS: Vent Mode:  [-] CPAP;PSV FiO2 (%):  [40 %] 40 % Set Rate:  [20 bmp] 20 bmp Vt Set:  [510 mL] 510 mL PEEP:  [5 cmH20] 5 cmH20 Pressure Support:  [10 cmH20-15 cmH20] 10 cmH20 Plateau Pressure:  [20 cmH20-24 cmH20] 20 cmH20 INTAKE / OUTPUT:  Intake/Output Summary (Last 24 hours) at 08/20/14 1105 Last data filed at 08/20/14 1000  Gross per 24 hour  Intake  980.5 ml  Output   2965 ml  Net  -1984.5 ml    PHYSICAL EXAMINATION: General: rass 0 followed commands, improved alertness Neuro:  PERRL,  followed very simple commands, rass 0 HEENT:  JVD Cardiovascular:  s1 s2 RRR Lungs:  ronchi bilat  improved Abdomen:  Obese, soft, non-distended Ext:  +2 symmetric BLE edema, not pitting Skin:  Grossly intact  LABS: PULMONARY  Recent Labs Lab 08/13/14 1207 08/14/14 0916 08/15/14 0538 08/15/14 1123 08/16/14 0500  PHART 7.348* 7.423 7.358 7.330* 7.352  PCO2ART 42.1 34.1* 44.4 53.0* 48.8*  PO2ART 17.0* 45.0* 79.5* 60.0* 82.1  HCO3 24.1* 23.3 24.3* 27.9* 26.3*  TCO2 26 25 25.7 30 27.8  O2SAT 30.0 90.0 95.3 88.0 94.8    CBC  Recent Labs Lab 08/17/14 0334 08/18/14 0520 08/20/14 0450  HGB 9.2* 9.6* 10.0*  HCT 27.7* 30.1* 32.3*  WBC 13.4* 13.0* 11.4*  PLT 181 191 199    COAGULATION  Recent Labs Lab 08/13/14 1216 08/13/14 1600 08/15/14 0455 08/18/14 1650 08/20/14 0450  INR 3.47* 2.92* 1.96* 1.97* 2.07*    CARDIAC    Recent Labs Lab 08/13/14 1600 08/13/14 2200  TROPONINI 0.19* 0.21*   No results for input(s): PROBNP in the last 168 hours.   CHEMISTRY  Recent Labs Lab 08/14/14 1217  08/16/14 1117 08/17/14 0335 08/18/14 0520 08/18/14 1735 08/19/14 0410 08/20/14 0450  NA 136  < > 137 136 137  --  137 135  K 3.8  < > 4.2 4.1 4.3  --  4.4 3.6  CL 98*  < > 99* 97* 98*  --  99* 96*  CO2 25  < > 25 25 28   --  29 31  GLUCOSE 120*  < > 147* 119* 126*  --  148* 135*  BUN 59*  < > 69* 90* 77*  --  57* 53*  CREATININE 6.64*  < > 7.74* 8.56* 6.04*  --  4.50* 4.09*  CALCIUM 8.6*  < > 8.6* 8.6* 8.7*  --  8.9 9.2  MG 2.0  --   --   --   --  1.9  --   --   PHOS  --   --  6.4* 7.3* 5.4*  --   --  4.0  < > = values in this interval not displayed. Estimated Creatinine Clearance: 22.3 mL/min (by C-G formula based on Cr of 4.09).   LIVER  Recent Labs Lab 08/13/14 1216 08/13/14 1600  08/14/14 0200 08/15/14 0455 08/16/14 1117 08/17/14 0335  08/18/14 0520 08/18/14 1650 08/20/14 0450  AST  --   --   --  13* 16  --   --   --   --   --   ALT  --   --   --  15* 18  --   --   --   --   --   ALKPHOS  --   --   --  156* 144*  --   --   --   --   --   BILITOT  --   --   --  1.0 1.1  --   --   --   --   --   PROT  --   --   --  6.8 6.1*  --   --   --   --   --   ALBUMIN  --   --   < > 2.7* 2.4* 2.2* 2.1* 2.1*  --  2.3*  INR 3.47* 2.92*  --   --  1.96*  --   --   --  1.97* 2.07*  < > = values in this interval not displayed.   INFECTIOUS No results for input(s): LATICACIDVEN, PROCALCITON in the last 168 hours.   ENDOCRINE CBG (last 3)   Recent Labs  08/20/14 0054 08/20/14 0449 08/20/14 0752  GLUCAP 157* 135* 145*     IMAGING x48h  - image(s) personally visualized  -   highlighted in bold Dg Chest Port 1 View  08/20/2014   CLINICAL DATA:  CHF  EXAM: PORTABLE CHEST - 1 VIEW  COMPARISON:  08/19/2014  FINDINGS: The endotracheal tube tip remains just below the clavicular heads. The orogastric tube crosses the diaphragm at least. Stable left subclavian central line, tip at the upper SVC.  No change in cardiomegaly and vascular pedicle widening. Mediastinal contours are distorted by rightward rotation.  Poor inflation with persistent indistinct somewhat streaky bibasilar opacities. Pulmonary venous congestion.  IMPRESSION: 1. Stable positioning of tubes and central line. 2. Unchanged bibasilar atelectasis or pneumonia. 3. Stable cardiopericardial enlargement and pulmonary venous congestion.   Electronically Signed   By: Monte Fantasia M.D.   On: 08/20/2014 06:15   Dg Chest Port 1 View  08/19/2014   CLINICAL DATA:  Respiratory failure.  EXAM: PORTABLE CHEST - 1 VIEW  COMPARISON:  08/17/2014.  FINDINGS: Endotracheal tube, left subclavian line, and NG tube in good anatomic position. Cardiomegaly with partial clearing of bilateral pulmonary infiltrates and pleural effusions. These findings consistent  with improving congestive heart failure.  No pneumothorax.  IMPRESSION: 1.  Lines and tubes in stable position.  2. Cardiomegaly with improvement of bilateral pulmonary infiltrates and pleural effusions suggesting partial clearing of congestive heart failure. Basilar pneumonia cannot be excluded.   Electronically Signed   By: [place]   On: 08/19/2014 07:30    ASSESSMENT / PLAN:  PULMONARY ETT 7/1 >> 08/16/14, 7/4 (self extubated and reintubated) >> A: Acute respiratory failure s/p cardiac arrest Pulm edema vs ARDS OSA/OHS Rapid failed extubation  P:   Full vent support; no extubation wean cpap 5 PS 5, goal 2 hrs Appreciate daily HD goals May need trach, would wait for continued daily hd result on weaning pcxr is responding to hd, repeat in am   CARDIOVASCULAR L [place] CVL [id number] >>  A:  Asystolic cardiac arrest Acute on chronic CHF Severe cardiomyopathy H/O AF (sinus presently) H/o HTN, CAD NEW ECG QRS complex changes - resolved with temp increase  P:  Telemetry monitoring Map 55 with good MS goal  RENAL A:   ESRD on HD Noncompliance with HD Hyperkalemia, resolved Hyponatremia, mild Mild metabolic acidosis  P:   VOlume removal per HD daily Daily HD , pcxr better Chem in am   GASTROINTESTINAL A:   High residuals and eMD started on reglan 08/17/14  But stopped reglan due to long QTc  P:   SUP: IV PPI TF to continue to 20 today, need to verify goal Avoid reglan (QTc 558msec 08/17/14 )    HEMATOLOGIC A:   Anemia of chronic illness Excessive warfarin anticoagulation Jehovah's witness - family confirms 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 Wbc from hemoconcentration and hct rising with HD  P:  DVT px: bloody secretions - resolved Continued hep drip liit draws when able [facility] witness) Need coags for likely trach needs consider ferritin then if less 200 IF iron with epo  INFECTIOUS A:   No evidence of infection P:   Follow fever curve pcxr in am , seems to be improving iwith HD  ENDOCRINE A:   DM 2 R/o  sick euthyroid vs hyper P:   CBG monitoring and SSI Would repeat thyroid fxn in a few weeks  NEUROLOGIC A:   Acute anoxic encephalopathy - anoxia - now awake follows commands but diffusely weak L3 fracture (chronic)  P:   DC fent gttt rass goal 0  wua mandatory today PT on vent   FAMILY  - Updates: none at bedside 08/17/14 and 08/18/14   - Inter-disciplinary family meet or Palliative Care meeting due by:  7/8   Ccm time [place]. Titus Mould, MD, [place] Pgr: [place] [facility]

## 2014-08-20 NOTE — Progress Notes (Signed)
[facility] Progress Note   Subjective: on vent, awakens easily  Filed Vitals:   08/20/14 0600 08/20/14 0700 08/20/14 0707 08/20/14 0800  BP: 120/60   138/67  Pulse: 79 81 77   Temp: 100.2 F (37.9 C) 100.2 F (37.9 C) 100.2 F (37.9 C) 99.9 F (37.7 C)  TempSrc:      Resp: 17 24 27 26   Height:      Weight:      SpO2: 97% 98% 90% 100%   Exam: On vent, awake No jvd Chest clear bilat RRR no RG Abd soft , obese, ntnd No LE or UE edema L arm AVF patent Neuro moving all ext but weak  TTS [place] 4h  EDW 119kg will have lower EDW at discharge  LFA AVF Heparin 3600 450/800 Hect 4 , Venofer 50/wk, mircera 75 q2wks last 6/21       Assessment: 1. Asystole/ cardiac arrest/ severe CM EF 20%- s/p cooling protocol. Per primary 2. Pulm edema severe/ vol excess- still wet on cxr but improving, cont daily HD 3. ESRD on HD 4. Anemia congt darbe 100/wk,  5. MBD resume meds when off vent 6. DM 2 7. Afib on IV hep now  Plan - as above    Kelly Splinter MD  pager [phone]    cell [phone]  08/20/2014, 8:46 AM     Recent Labs Lab 08/17/14 0335 08/18/14 0520 08/19/14 0410 08/20/14 0450  NA 136 137 137 135  K 4.1 4.3 4.4 3.6  CL 97* 98* 99* 96*  CO2 25 28 29 31   GLUCOSE 119* 126* 148* 135*  BUN 90* 77* 57* 53*  CREATININE 8.56* 6.04* 4.50* 4.09*  CALCIUM 8.6* 8.7* 8.9 9.2  PHOS 7.3* 5.4*  --  4.0    Recent Labs Lab 08/14/14 0200 08/15/14 0455  08/17/14 0335 08/18/14 0520 08/20/14 0450  AST 13* 16  --   --   --   --   ALT 15* 18  --   --   --   --   ALKPHOS 156* 144*  --   --   --   --   BILITOT 1.0 1.1  --   --   --   --   PROT 6.8 6.1*  --   --   --   --   ALBUMIN 2.7* 2.4*  < > 2.1* 2.1* 2.3*  < > = values in this interval not displayed.  Recent Labs Lab 08/15/14 0455  08/17/14 0334 08/18/14 0520 08/20/14 0450  WBC 14.0*  < > 13.4* 13.0* 11.4*  NEUTROABS 11.7*  --  10.8*  --  8.4*  HGB 11.2*  < > 9.2* 9.6* 10.0*  HCT  33.4*  < > 27.7* 30.1* 32.3*  MCV 91.0  < > 90.5 92.9 94.7  PLT 225  < > 181 191 199  < > = values in this interval not displayed. . sodium chloride   Intravenous Once  . antiseptic oral rinse  7 mL Mouth Rinse QID  . chlorhexidine  15 mL Mouth Rinse BID  . darbepoetin (ARANESP) injection - DIALYSIS  100 mcg Intravenous Q Tue-HD  . feeding supplement (NEPRO CARB STEADY)  1,000 mL Per Tube Q24H  . feeding supplement (PRO-STAT 64)  60 mL Oral 5 X Daily  . ferric gluconate (FERRLECIT/NULECIT) IV  62.5 mg Intravenous Q Thu-HD  . insulin aspart  0-9 Units Subcutaneous 6 times per day  . pantoprazole sodium  40 mg Per Tube QHS  .  sodium chloride  10-40 mL Intracatheter Q12H   . heparin 1,850 Units/hr (08/20/14 0800)  . norepinephrine (LEVOPHED) Adult infusion 0 mcg/min (08/18/14 1626)  . propofol (DIPRIVAN) infusion Stopped (08/18/14 0850)   sodium chloride, sodium chloride, sodium chloride, sodium chloride, sodium chloride, sodium chloride, feeding supplement (NEPRO CARB STEADY), fentaNYL (SUBLIMAZE) injection, heparin, heparin, lidocaine-prilocaine, pentafluoroprop-tetrafluoroeth, sodium chloride

## 2014-08-21 ENCOUNTER — Inpatient Hospital Stay (HOSPITAL_COMMUNITY): Payer: Medicare Other

## 2014-08-21 LAB — GLUCOSE, CAPILLARY
GLUCOSE-CAPILLARY: 146 mg/dL — AB (ref 65–99)
GLUCOSE-CAPILLARY: 109 mg/dL — AB (ref 65–99)
Glucose-Capillary: 107 mg/dL — ABNORMAL HIGH (ref 65–99)
Glucose-Capillary: 111 mg/dL — ABNORMAL HIGH (ref 65–99)
Glucose-Capillary: 126 mg/dL — ABNORMAL HIGH (ref 65–99)
Glucose-Capillary: 135 mg/dL — ABNORMAL HIGH (ref 65–99)

## 2014-08-21 LAB — HEPATIC FUNCTION PANEL
ALT: 15 U/L — ABNORMAL LOW (ref 17–63)
AST: 18 U/L (ref 15–41)
Albumin: 2.3 g/dL — ABNORMAL LOW (ref 3.5–5.0)
Alkaline Phosphatase: 173 U/L — ABNORMAL HIGH (ref 38–126)
Bilirubin, Direct: 0.4 mg/dL (ref 0.1–0.5)
Indirect Bilirubin: 0.4 mg/dL (ref 0.3–0.9)
Total Bilirubin: 0.8 mg/dL (ref 0.3–1.2)
Total Protein: 6.1 g/dL — ABNORMAL LOW (ref 6.5–8.1)

## 2014-08-21 LAB — HEPARIN LEVEL (UNFRACTIONATED): Heparin Unfractionated: 0.28 [IU]/mL — ABNORMAL LOW (ref 0.30–0.70)

## 2014-08-21 MED ORDER — NEPRO/CARBSTEADY PO LIQD
237.0000 mL | ORAL | Status: DC | PRN
  Filled 2014-08-21: qty 237

## 2014-08-21 MED ORDER — SODIUM CHLORIDE 0.9 % IV SOLN: 100.0000 mL | INTRAVENOUS | Status: DC | PRN

## 2014-08-21 MED ORDER — CHLORHEXIDINE GLUCONATE 0.12 % MT SOLN: 15.0000 mL | Freq: Two times a day (BID) | OROMUCOSAL | Status: DC

## 2014-08-21 MED ORDER — HEPARIN SODIUM (PORCINE) 1000 UNIT/ML DIALYSIS
1000.0000 [IU] | INTRAMUSCULAR | Status: DC | PRN
  Filled 2014-08-21: qty 1

## 2014-08-21 MED ORDER — PENTAFLUOROPROP-TETRAFLUOROETH EX AERO: 1.0000 "application " | INHALATION_SPRAY | CUTANEOUS | Status: DC | PRN

## 2014-08-21 MED ORDER — LIDOCAINE-PRILOCAINE 2.5-2.5 % EX CREA
1.0000 "application " | TOPICAL_CREAM | CUTANEOUS | Status: DC | PRN
  Filled 2014-08-21: qty 5

## 2014-08-21 MED ORDER — ALTEPLASE 2 MG IJ SOLR
2.0000 mg | Freq: Once | INTRAMUSCULAR | Status: DC | PRN
  Filled 2014-08-21: qty 2

## 2014-08-21 MED ORDER — PANTOPRAZOLE SODIUM 40 MG IV SOLR
40.0000 mg | Freq: Every day | INTRAVENOUS | Status: DC
  Administered 2014-08-21 – 2014-08-27 (×7): 40 mg via INTRAVENOUS
  Filled 2014-08-21 (×10): qty 40

## 2014-08-21 MED ORDER — CETYLPYRIDINIUM CHLORIDE 0.05 % MT LIQD
7.0000 mL | Freq: Two times a day (BID) | OROMUCOSAL | Status: DC
  Administered 2014-08-21 – 2014-08-23 (×6): 7 mL via OROMUCOSAL

## 2014-08-21 MED ORDER — HEPARIN SODIUM (PORCINE) 1000 UNIT/ML DIALYSIS
2000.0000 [IU] | INTRAMUSCULAR | Status: DC | PRN
  Filled 2014-08-21: qty 2

## 2014-08-21 MED ORDER — LIDOCAINE HCL (PF) 1 % IJ SOLN: 5.0000 mL | INTRAMUSCULAR | Status: DC | PRN

## 2014-08-21 NOTE — Progress Notes (Signed)
   Call from RN  Patient now on 5/5 after HD complete. Doing well.  Feels patient can be extubated  PLAN Extubate Dr Alva Garnet available for reintubation needs - d/w him  Dr. Brand Males, M.D., [facility].C.P Pulmonary and Critical Care Medicine Staff Physician [employer] Pulmonary and Critical Care Pager: [phone], If no answer or between  15:00h - 7:00h: call [phone]  08/21/2014 11:47 AM

## 2014-08-21 NOTE — Evaluation (Signed)
Clinical/Bedside Swallow Evaluation Patient Details  Name: Daniel Whitney MRN: [id number] Date of Birth: 1955-08-18  Today's Date: 08/21/2014 Time: SLP Start Time (ACUTE ONLY): [id number] SLP Stop Time (ACUTE ONLY): 1433 SLP Time Calculation (min) (ACUTE ONLY): 21 min  Past Medical History:  Past Medical History  Diagnosis Date  . Glaucoma   . Hypertension   . Hyperlipidemia   . Atrial fibrillation 12/30/2012  . CHF (congestive heart failure) 12/30/2012  . Diabetic retinopathy 12/30/2012  . Morbid obesity 12/30/2012  . Pulmonary hypertension 12/30/2012  . Myocardial infarction     "I've had ~ 3; last one was in ~ 10/2013" (01/30/2014)  . Diabetes mellitus with neuropathy   . OSA (obstructive sleep apnea) 12/30/2012    "lost weight; no longer needed CPAP; retested said I needed it; didn't followup cause I was feeling fine" (01/30/2014)  . GERD (gastroesophageal reflux disease)   . Stroke ~ 2005; ~ 2005    "they were mild; I didn't even notice I'd had them"; denies residual on 01/30/2014  . History of gout     "right big toe"  . Arthritis     "all over"  . Chronic lower back pain   . ESRD (end stage renal disease) on dialysis started in 2013    "MWF; [facility]; [address]" (01/30/2014)  . Refusal of blood transfusions as patient is Jehovah's Witness   . Anemia due to other cause 07/07/2014  . Allergic rhinitis 07/07/2014   Past Surgical History:  Past Surgical History  Procedure Laterality Date  . Total hip arthroplasty Right 1980  . Av fistula placement Left ~ 10/2013    "forearm"  . Closed reduction hip dislocation Right 1970's   HPI:  Pt is a 59  year old male with ESRD on HD who had missed his last 2 dialysis treatments. 7/01 early AM he developed acute onset SOB and then became unresponsive. EMS noted asystole and began CPR. Total downtime estimated at about 20 mins. In ED he had several runs of VT, but was otherwise was hemodynamically stable on ventilator. PCCM to admit. Pt  intubated upon admission with periods of self exutbation and prompt reintubation. This date exutabted by respiratory therapy. Most recent Chest x ray showed bibasilar atelectasis or PNA with pulmonary venous congestion.    Assessment / Plan / Recommendation Clinical Impression  Pt post 8 day intubation period putting him at high risk for acute reversible dysphagia. Clinically pt exhibiting signs concerning for poor airway protection including frequent throar clear and intermittent wet vocal quality. Based upon pts recent lengthy intubation, respiratory compromise, and most recent chest x ray questionable for PNA,  recommend MBS prior to initiation of diet. Will attempt in the morning. Continue NPO at this time.      Aspiration Risk  Moderate    Diet Recommendation NPO   Medication Administration: Via alternative means    Other  Recommendations Oral Care Recommendations: Oral care QID   Follow Up Recommendations       Frequency and Duration min 2x/week  2 weeks   Pertinent Vitals/Pain     SLP Swallow Goals     Swallow Study Prior Functional Status       General Date of Onset: 08/13/14 Other Pertinent Information: Pt is a 59  year old male with ESRD on HD who had missed his last 2 dialysis treatments. 7/01 early AM he developed acute onset SOB and then became unresponsive. EMS noted asystole and began CPR. Total downtime estimated at about 20  mins. In ED he had several runs of VT, but was otherwise was hemodynamically stable on ventilator. PCCM to admit. Pt intubated upon admission with periods of self exutbation and prompt reintubation. This date exutabted by respiratory therapy. Most recent Chest x ray showed bibasilar atelectasis or PNA with pulmonary venous congestion.  Diet Prior to this Study: NPO Temperature Spikes Noted: Yes Respiratory Status: Supplemental O2 delivered via (comment) History of Recent Intubation: Yes Length of Intubations (days): 8 days Date extubated:  08/21/14 Behavior/Cognition: Alert;Cooperative;Pleasant mood Oral Cavity - Dentition: Poor condition;Missing dentition Patient Positioning: Upright in bed Baseline Vocal Quality: Hoarse;Low vocal intensity Volitional Cough: Weak Volitional Swallow: Able to elicit    Oral/Motor/Sensory Function Overall Oral Motor/Sensory Function: Appears within functional limits for tasks assessed Labial ROM: Within Functional Limits Labial Symmetry: Within Functional Limits Labial Strength: Within Functional Limits Lingual ROM: Within Functional Limits Lingual Symmetry: Within Functional Limits Lingual Strength: Within Functional Limits Facial ROM: Within Functional Limits Facial Symmetry: Within Functional Limits   Ice Chips Ice chips: Impaired Presentation: Spoon Oral Phase Impairments: Impaired anterior to posterior transit Oral Phase Functional Implications: Prolonged oral transit Pharyngeal Phase Impairments: Suspected delayed Swallow;Throat Clearing - Immediate   Thin Liquid Thin Liquid: Impaired Presentation: Cup;Spoon Oral Phase Impairments: Impaired anterior to posterior transit Oral Phase Functional Implications: Prolonged oral transit Pharyngeal  Phase Impairments: Suspected delayed Swallow;Throat Clearing - Immediate;Wet Vocal Quality;Multiple swallows    Nectar Thick Nectar Thick Liquid: Not tested   Honey Thick Honey Thick Liquid: Not tested   Puree Puree: Impaired Oral Phase Impairments: Reduced lingual movement/coordination Pharyngeal Phase Impairments: Suspected delayed Swallow;Throat Clearing - Immediate   Solid   GO    Solid: Within functional limits      Arvil Chaco MA, CCC-SLP Acute Care Speech Language Pathologist    Arvil Chaco E 08/21/2014,2:42 PM

## 2014-08-21 NOTE — Progress Notes (Signed)
[facility] Progress Note   Subjective: on vent, awake and alert  Filed Vitals:   08/21/14 0445 08/21/14 0500 08/21/14 0600 08/21/14 0717  BP: 102/59 102/59 121/62   Pulse: 72 70 70 69  Temp:  99.9 F (37.7 C) 100.2 F (37.9 C) 99.9 F (37.7 C)  TempSrc:      Resp: 20 20 20 17   Height:      Weight:  106.5 kg (234 lb 12.6 oz)    SpO2: 100% 96% 100% 100%   Exam: On vent, awake No jvd Chest clear bilat RRR no RG Abd soft , obese, ntnd No LE or UE edema L arm AVF patent Neuro moving all ext but weak  TTS [place] 4h  EDW 119kg will have lower EDW at discharge  LFA AVF Heparin 3600 450/800 Hect 4 , Venofer 50/wk, mircera 75 q2wks last 6/21       Assessment: 1. Asystole/ cardiac arrest/ severe CM EF 20%- s/p cooling protocol. Per primary 2. Pulm edema severe/ vol excess- mostly resolved 3. ESRD on HD 4. Anemia congt darbe 100/wk,  5. MBD resume meds when off vent 6. DM 2 7. Afib on IV hep now  Plan - repeat CXR now, HD now w UF as tolerated. Looks like he should be arriving at a new dry weight very soon, likely today.     Kelly Splinter MD  pager [phone]    cell [phone]  08/21/2014, 8:06 AM     Recent Labs Lab 08/17/14 0335 08/18/14 0520 08/19/14 0410 08/20/14 0450  NA 136 137 137 135  K 4.1 4.3 4.4 3.6  CL 97* 98* 99* 96*  CO2 25 28 29 31   GLUCOSE 119* 126* 148* 135*  BUN 90* 77* 57* 53*  CREATININE 8.56* 6.04* 4.50* 4.09*  CALCIUM 8.6* 8.7* 8.9 9.2  PHOS 7.3* 5.4*  --  4.0    Recent Labs Lab 08/15/14 0455  08/18/14 0520 08/20/14 0450 08/21/14 0345  AST 16  --   --   --  18  ALT 18  --   --   --  15*  ALKPHOS 144*  --   --   --  173*  BILITOT 1.1  --   --   --  0.8  PROT 6.1*  --   --   --  6.1*  ALBUMIN 2.4*  < > 2.1* 2.3* 2.3*  < > = values in this interval not displayed.  Recent Labs Lab 08/15/14 0455  08/17/14 0334 08/18/14 0520 08/20/14 0450  WBC 14.0*  < > 13.4* 13.0* 11.4*  NEUTROABS 11.7*  --   10.8*  --  8.4*  HGB 11.2*  < > 9.2* 9.6* 10.0*  HCT 33.4*  < > 27.7* 30.1* 32.3*  MCV 91.0  < > 90.5 92.9 94.7  PLT 225  < > 181 191 199  < > = values in this interval not displayed. . sodium chloride   Intravenous Once  . antiseptic oral rinse  7 mL Mouth Rinse QID  . chlorhexidine  15 mL Mouth Rinse BID  . darbepoetin (ARANESP) injection - DIALYSIS  100 mcg Intravenous Q Tue-HD  . feeding supplement (NEPRO CARB STEADY)  1,000 mL Per Tube Q24H  . feeding supplement (PRO-STAT 64)  60 mL Oral 5 X Daily  . ferric gluconate (FERRLECIT/NULECIT) IV  62.5 mg Intravenous Q Thu-HD  . insulin aspart  0-9 Units Subcutaneous 6 times per day  . pantoprazole sodium  40 mg Per Tube  QHS  . sodium chloride  10-40 mL Intracatheter Q12H   . heparin 1,850 Units/hr (08/20/14 2237)  . norepinephrine (LEVOPHED) Adult infusion 0 mcg/min (08/18/14 1626)  . propofol (DIPRIVAN) infusion 8 mcg/kg/min (08/21/14 0604)   sodium chloride, sodium chloride, sodium chloride, sodium chloride, alteplase, feeding supplement (NEPRO CARB STEADY), fentaNYL (SUBLIMAZE) injection, heparin, heparin, heparin, [START ON 08/22/2014] heparin, lidocaine (PF), lidocaine-prilocaine, pentafluoroprop-tetrafluoroeth, sodium chloride

## 2014-08-21 NOTE — Progress Notes (Signed)
PULMONARY / CRITICAL CARE MEDICINE   Name: Daniel Whitney MRN: [id number] DOB: December 13, 1955    ADMISSION DATE:  08/13/2014 CONSULTATION DATE:  08/13/2014  REFERRING MD :  Dr. Lita Mains  CHIEF COMPLAINT: Asystole  INITIAL PRESENTATION:  59 year old male with ESRD on HD had missed his last 2 dialysis treatments. 7/01 early AM he developed acute onset SOB and then became unresponsive. EMS noted asystole and began CPR. Total downtime estimated at about 20 mins. In ED he had several runs of VT, but was otherwise was hemodynamically stable on ventilator. PCCM to admit.  STUDIES:  7/01 CT head: NAICP 7/01 EEG: This sedated EEG is abnormal due to moderate diffuse slowing and suppression of the background 7/01 TTE: LVEF 20% 7/1 cardiac arrest asystole > to ICU. Arcitc sun protocol 7/2- HD, rewarm  08/15/14: :peep to 5 successful last night 08/16/14: SEfl extubated and promptly reintubated 08/17/14: Self extubated yesterday while off dipriva and promptly reintubated.  Following commands per RN. On low dose levophed. HD ongoing.  7/6- daily hd schedule changed 7./8/16: awake   SUBJECTIVE/OVERNIGHT/INTERVAL HX 08/21/2014: Doing SBT on low dose diprivan. HD ongoing   VITAL SIGNS: Temp:  [98.4 F (36.9 C)-100.8 F (38.2 C)] 99.9 F (37.7 C) (07/09 0717) Pulse Rate:  [49-86] 77 (07/09 0805) Resp:  [13-30] 17 (07/09 0805) BP: (85-147)/(42-73) 108/51 mmHg (07/09 0805) SpO2:  [96 %-100 %] 100 % (07/09 0805) FiO2 (%):  [40 %] 40 % (07/09 0717) Weight:  [106.5 kg (234 lb 12.6 oz)-110.9 kg (244 lb 7.8 oz)] 106.5 kg (234 lb 12.6 oz) (07/09 0500) HEMODYNAMICS:   VENTILATOR SETTINGS: Vent Mode:  [-] CPAP;PSV FiO2 (%):  [40 %] 40 % Set Rate:  [20 bmp] 20 bmp Vt Set:  [510 mL] 510 mL PEEP:  [5 cmH20] 5 cmH20 Pressure Support:  [5 cmH20-10 cmH20] 10 cmH20 Plateau Pressure:  [18 cmH20-19 cmH20] 19 cmH20 INTAKE / OUTPUT:  Intake/Output Summary (Last 24 hours) at 08/21/14 0822 Last data filed at 08/21/14  0604  Gross per 24 hour  Intake 1045.99 ml  Output   3055 ml  Net -2009.01 ml    PHYSICAL EXAMINATION: General:Looks better . Still deconditioned Neuro:  PERRL,  followed very simple commands, rass 0, Able to lift arms, lowers and head off gravity. Able to take deep breath HEENT:  JVD Cardiovascular:  s1 s2 RRR Lungs:  CTA, Doin SBT on dlow dose diprivan without problem Abdomen:  Obese, soft, non-distended Ext:  +1 symmetric BLE edema, not pitting - edema improved Skin:  Grossly intact  LABS: PULMONARY  Recent Labs Lab 08/14/14 0916 08/15/14 0538 08/15/14 1123 08/16/14 0500 08/20/14 1224  PHART 7.423 7.358 7.330* 7.352 7.323*  PCO2ART 34.1* 44.4 53.0* 48.8* 62.3*  PO2ART 45.0* 79.5* 60.0* 82.1 86.0  HCO3 23.3 24.3* 27.9* 26.3* 32.3*  TCO2 25 25.7 30 27.8 34  O2SAT 90.0 95.3 88.0 94.8 95.0    CBC  Recent Labs Lab 08/17/14 0334 08/18/14 0520 08/20/14 0450  HGB 9.2* 9.6* 10.0*  HCT 27.7* 30.1* 32.3*  WBC 13.4* 13.0* 11.4*  PLT 181 191 199    COAGULATION  Recent Labs Lab 08/15/14 0455 08/18/14 1650 08/20/14 0450  INR 1.96* 1.97* 2.07*    CARDIAC   No results for input(s): TROPONINI in the last 168 hours. No results for input(s): PROBNP in the last 168 hours.   CHEMISTRY  Recent Labs Lab 08/14/14 1217  08/16/14 1117 08/17/14 0335 08/18/14 0520 08/18/14 1735 08/19/14 0410 08/20/14 0450  NA 136  < >  137 136 137  --  137 135  K 3.8  < > 4.2 4.1 4.3  --  4.4 3.6  CL 98*  < > 99* 97* 98*  --  99* 96*  CO2 25  < > 25 25 28   --  29 31  GLUCOSE 120*  < > 147* 119* 126*  --  148* 135*  BUN 59*  < > 69* 90* 77*  --  57* 53*  CREATININE 6.64*  < > 7.74* 8.56* 6.04*  --  4.50* 4.09*  CALCIUM 8.6*  < > 8.6* 8.6* 8.7*  --  8.9 9.2  MG 2.0  --   --   --   --  1.9  --   --   PHOS  --   --  6.4* 7.3* 5.4*  --   --  4.0  < > = values in this interval not displayed. Estimated Creatinine Clearance: 22 mL/min (by C-G formula based on Cr of  4.09).   LIVER  Recent Labs Lab 08/15/14 0455 08/16/14 1117 08/17/14 0335 08/18/14 0520 08/18/14 1650 08/20/14 0450 08/21/14 0345  AST 16  --   --   --   --   --  18  ALT 18  --   --   --   --   --  15*  ALKPHOS 144*  --   --   --   --   --  173*  BILITOT 1.1  --   --   --   --   --  0.8  PROT 6.1*  --   --   --   --   --  6.1*  ALBUMIN 2.4* 2.2* 2.1* 2.1*  --  2.3* 2.3*  INR 1.96*  --   --   --  1.97* 2.07*  --      INFECTIOUS No results for input(s): LATICACIDVEN, PROCALCITON in the last 168 hours.   ENDOCRINE CBG (last 3)   Recent Labs  08/20/14 2004 08/21/14 08/21/14 0352  GLUCAP 169* 111* 146*     IMAGING x48h  - image(s) personally visualized  -   highlighted in bold Dg Chest Port 1 View  08/20/2014   CLINICAL DATA:  CHF  EXAM: PORTABLE CHEST - 1 VIEW  COMPARISON:  08/19/2014  FINDINGS: The endotracheal tube tip remains just below the clavicular heads. The orogastric tube crosses the diaphragm at least. Stable left subclavian central line, tip at the upper SVC.  No change in cardiomegaly and vascular pedicle widening. Mediastinal contours are distorted by rightward rotation.  Poor inflation with persistent indistinct somewhat streaky bibasilar opacities. Pulmonary venous congestion.  IMPRESSION: 1. Stable positioning of tubes and central line. 2. Unchanged bibasilar atelectasis or pneumonia. 3. Stable cardiopericardial enlargement and pulmonary venous congestion.   Electronically Signed   By: Monte Fantasia M.D.   On: 08/20/2014 06:15    ASSESSMENT / PLAN:  PULMONARY ETT 7/1 >> 08/16/14, 7/4 (self extubated and reintubated) >> A: #Baseline OSA/OHS  #Admit issue Acute respiratory failure s/p cardiac arrest - Pulm edema vs ARDS - Rapid failed extubation  #Currentl  - doing SBT on low dose diprivan   P:   Do sBT off diprivan and if ok - aim to extubate [place], Full vent support; Will need trach if failss extubation again   CARDIOVASCULAR L [place] CVL  [id number] >>  A:  Asystolic cardiac arrest Acute on chronic CHF Severe cardiomyopathy H/O AF (sinus presently) H/o HTN, CAD NEW ECG QRS complex  changes - resolved with temp increase   - not on pressors  P:  Telemetry monitoring Map 55 with good MS goal  RENAL A:   #Baseline ESRD on HD - Noncompliance with HD - MWF  #Admit Acidosis and Hyperkalemia, resolved   P:   VOlume removal per HD daily   GASTROINTESTINAL A:   High residuals and eMD started on reglan 08/17/14  But stopped reglan due to long QTc 08/17/14  P:   Hold TF anticipating extubatiion 08/21/2014 SUP: IV PPI Avoid reglan (QTc 53msec 08/17/14 )    HEMATOLOGIC A:   Anemia of chronic illness Excessive warfarin anticoagulation Jehovah's witness - family confirms 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   - unchanged anemia  P:  DVT px: bloody secretions - resolved Continued hep drip liit draws when able [facility] witness) Need coags for likely trach needs consider ferritin then if less 200 IF iron with epo  INFECTIOUS A:   No evidence of infection P:   Follow fever curve pcxr in am , seems to be improving iwith HD  ENDOCRINE A:   DM 2 R/o sick euthyroid vs hyper P:   CBG monitoring and SSI Would repeat thyroid fxn in a few weeks  NEUROLOGIC A:   Acute anoxic encephalopathy - anoxia - now awake follows commands but diffusely weak L3 fracture (chronic)   - rapidly improving neuro diffuse weakness. Normal mental status for simple commands 08/21/2014  P:   Cont fent prn DC diprivan gtt rass goal 0  wua mandatory today PT on vent   FAMILY  - Updates: none at bedside 08/17/14 and 08/18/14 and 08/21/2014. Patient updated about possible extubation 08/21/2014     - Inter-disciplinary family meet or Palliative Care meeting due by:  7/8    The patient is critically ill with multiple organ systems failure and requires high complexity decision making for assessment and support, frequent evaluation and titration of therapies,  application of advanced monitoring technologies and extensive interpretation of multiple databases.   Critical Care Time devoted to patient care services described in this note is  30  Minutes. This time reflects time of care of this signee Dr Brand Males. This critical care time does not reflect procedure time, or teaching time or supervisory time of PA/NP/Med student/Med Resident etc but could involve care discussion time    Dr. Brand Males, M.D., [facility].C.P Pulmonary and Critical Care Medicine Staff Physician [place] [facility]monary and Critical Care Pager: [phone], If no answer or between  15:00h - 7:00h: call [phone]  08/21/2014 8:32 AM

## 2014-08-21 NOTE — Procedures (Signed)
Extubation Procedure Note  Patient Details:   Name: Daniel Whitney DOB: [birth year] MRN: [id number]   Airway Documentation:     Evaluation  O2 sats: stable throughout Complications: No apparent complications Patient did tolerate procedure well. Bilateral Breath Sounds: Rhonchi Suctioning: Oral, Airway Yes  PT was extubated to 3L   Sats are stable. PT was able to tell me his name and is following all commands    Harolyn Cocker, Leonie Douglas 08/21/2014, 12:45 PM

## 2014-08-21 NOTE — Progress Notes (Signed)
PT Cancellation Note  Patient Details Name: Greysyn Weidinger MRN: [id number] DOB: Sep 11, 1955   Cancelled Treatment:    Reason Eval/Treat Not Completed: Medical issues which prohibited therapy (Pt on HD in room.  To be extubated late am. Will return as able this pm or in am. )   Nicholai Willette, [place] 08/21/2014, 8:34 AM [facility] [phone] (pager)

## 2014-08-21 NOTE — Progress Notes (Signed)
ANTICOAGULATION CONSULT NOTE - Follow Up Consult  Pharmacy Consult for heparin Indication: atrial fibrillation  Labs:  Recent Labs  08/18/14 1650 08/19/14 0410 08/20/14 0450 08/21/14 0345  HGB  --   --  10.0*  --   HCT  --   --  32.3*  --   PLT  --   --  199  --   APTT  --   --  121*  --   LABPROT 22.3*  --  23.2*  --   INR 1.97*  --  2.07*  --   HEPARINUNFRC  --  0.29* 0.30 0.28*  CREATININE  --  4.50* 4.09*  --      Assessment: 59 year old male with ESRD on HD had missed his last 2 dialysis treatments. Admitted 08/13/2014  early AM he developed acute onset SOB and then became unresponsive. EMS noted asystole and began CPR. Total estimated downtime estimated at about 20 mins. In ED he had several runs of VT  PMH: ESRD, Afib, CHF, DM, PAH, DM, OSA, CVA, MI, HTN, GERD, gout, HF, [place] consulted to dose heparin for atrial fibrillation  Anticoagulation/Heme: afib on coumadin PTA. INR= 6.82 on admission (vit K 5mg  IV on 08/13/14); Heparin started  7/3, HL slightly below goal (03.-0.7 units/ml); Continue to hold coumadin but INR is trending up; CBC stableJehovah's witness - 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. HGB 10.0 - 2mg  Vit K IV 7/8, LFTs WNL ON: Heparin@ 1850 units/h;  aranesp/iron q Thursday PTA, warfarin dose? Last clinic visit 5/23: dose reported as 3mg /day except 4.5mg  on Th Goal of Therapy:  Heparin level 0.3-0.7 units/ml   Plan:  Continue heparin at 1850 units/hr given indication for afib and inability to give blood products if bleeding. Daily HL/CBC Continue to hold coumadin     Thank you for allowing pharmacy to be a part of this patients care team.  Heloise Ochoa, Pharm.D.  08/21/2014 8:10 AM Phone: [phone]

## 2014-08-21 NOTE — Progress Notes (Signed)
D/w RT  - he failed PSV 5/5 and needed 10/5  Plan - reasess on 5/5 after HD complete - likely no exutbation 08/21/2014  - consider preceedex gtt if needed for agitation if diprivan not suitable - cotinue to hold diprivan but can restart if needed  Dr. Brand Males, M.D., [facility].C.P Pulmonary and Critical Care Medicine Staff Physician [place] [facility] Pager: [phone], If no answer or between  15:00h - 7:00h: call [phone]  08/21/2014 11:09 AM

## 2014-08-22 ENCOUNTER — Inpatient Hospital Stay (HOSPITAL_COMMUNITY): Payer: Medicare Other

## 2014-08-22 LAB — CBC
HEMATOCRIT: 35.5 % — AB (ref 39.0–52.0)
Hemoglobin: 10.8 g/dL — ABNORMAL LOW (ref 13.0–17.0)
MCH: 29.8 pg (ref 26.0–34.0)
MCHC: 30.4 g/dL (ref 30.0–36.0)
MCV: 97.8 fL (ref 78.0–100.0)
PLATELETS: 224 10*3/uL (ref 150–400)
RBC: 3.63 MIL/uL — AB (ref 4.22–5.81)
RDW: 16.1 % — ABNORMAL HIGH (ref 11.5–15.5)
WBC: 11.2 10*3/uL — ABNORMAL HIGH (ref 4.0–10.5)

## 2014-08-22 LAB — GLUCOSE, CAPILLARY
GLUCOSE-CAPILLARY: 126 mg/dL — AB (ref 65–99)
Glucose-Capillary: 117 mg/dL — ABNORMAL HIGH (ref 65–99)
Glucose-Capillary: 118 mg/dL — ABNORMAL HIGH (ref 65–99)
Glucose-Capillary: 117 mg/dL — ABNORMAL HIGH (ref 65–99)
Glucose-Capillary: 92 mg/dL (ref 65–99)
Glucose-Capillary: 144 mg/dL — ABNORMAL HIGH (ref 65–99)

## 2014-08-22 LAB — HEPARIN LEVEL (UNFRACTIONATED): Heparin Unfractionated: 0.54 IU/mL (ref 0.30–0.70)

## 2014-08-22 MED ORDER — SIMETHICONE 80 MG PO CHEW
160.0000 mg | CHEWABLE_TABLET | Freq: Four times a day (QID) | ORAL | Status: DC | PRN
  Administered 2014-08-22: 160 mg via ORAL
  Filled 2014-08-22: qty 2

## 2014-08-22 MED ORDER — WARFARIN - PHARMACIST DOSING INPATIENT
Freq: Every day | Status: DC
  Administered 2014-08-22 – 2014-08-23 (×2)

## 2014-08-22 MED ORDER — WARFARIN SODIUM 2 MG PO TABS
2.0000 mg | ORAL_TABLET | Freq: Once | ORAL | Status: AC
  Administered 2014-08-22: 2 mg via ORAL
  Filled 2014-08-22: qty 1

## 2014-08-22 NOTE — Progress Notes (Signed)
Patient came to the floor around 1000 am alert oriented, denies pain, no shortness of breath, v/s stable T. 98.8, HR. 81, R. 18, BP. 123/65, 91%/RA. SR on the monitor. Will continue to monitor patient.

## 2014-08-22 NOTE — Evaluation (Signed)
Physical Therapy Evaluation Patient Details Name: Daniel Whitney MRN: [id number] DOB: 10/29/1955 Today's Date: 08/22/2014   History of Present Illness  Pt is a 59 year old male with ESRD on HD who had missed his last 2 dialysis treatments. 7/01 early AM he developed acute onset SOB and then became unresponsive. EMS noted asystole and began CPR. Total downtime estimated at about 20 mins. In ED he had several runs of VT, but was otherwise was hemodynamically stable on ventilator. PCCM to admit. Pt intubated upon admission with periods of self exutbation and prompt reintubation. 7/9 exutabted by respiratory therapy. Most recent Chest x ray showed bibasilar atelectasis or PNA with pulmonary venous congestion.  Clinical Impression  Pt admitted with above diagnosis. Pt currently with functional limitations due to the deficits listed below (see PT Problem List). At the time of PT eval pt was able to perform transfers and pre-gait activity at EOB with assist from therapist for balance and safety. Pt will benefit from skilled PT to increase their independence and safety with mobility to allow discharge to the venue listed below. Feel this pt is a good candidate for CIR to improve overall functional mobility and decrease burden of care on family upon return home.      Follow Up Recommendations CIR;Supervision/Assistance - 24 hour    Equipment Recommendations  None recommended by PT    Recommendations for Other Services Rehab consult     Precautions / Restrictions Precautions Precautions: Fall Restrictions Weight Bearing Restrictions: No      Mobility  Bed Mobility Overal bed mobility: Needs Assistance Bed Mobility: Supine to Sit;Sit to Supine     Supine to sit: Mod assist Sit to supine: Mod assist   General bed mobility comments: Assist for management of LE's to/from EOB as well as for trunk elevation to full sitting position. Increased time for pt to scoot fully to EOB.    Transfers Overall transfer level: Needs assistance Equipment used: Rolling walker (2 wheeled) Transfers: Sit to/from Stand Sit to Stand: Mod assist         General transfer comment: Assist to power-up to full standing position. Increased time for pt to gain and maintain standing balance.   Ambulation/Gait             General Gait Details: Pt with heavy posterior lean and was not safe to step away from the EOB. Pt was able to complete pre-gait activity including marching in place at EOB with BUE's on RW for support and therapist min A for balance.   Stairs            Wheelchair Mobility    Modified Rankin (Stroke Patients Only)       Balance Overall balance assessment: Needs assistance Sitting-balance support: Feet supported;No upper extremity supported Sitting balance-Leahy Scale: Poor Sitting balance - Comments: UE support required   Standing balance support: Bilateral upper extremity supported;During functional activity Standing balance-Leahy Scale: Poor Standing balance comment: Significant posterior lean and therapist assist required as well as BUE support.                              Pertinent Vitals/Pain Pain Assessment: No/denies pain    Home Living Family/patient expects to be discharged to:: Private residence Living Arrangements: Other relatives Available Help at Discharge: Family;Available 24 hours/day Type of Home: House Home Access: Stairs to enter   [employer] of Steps: 6-8 Home Layout: One level Home Equipment: Gilford Rile -  2 wheels;Cane - single point;Shower seat      Prior Function Level of Independence: Independent with assistive device(s)         Comments: States he was using both the walker and the cane to ambulate. Was able to bathe and dress without assist     Hand Dominance   Dominant Hand: Right    Extremity/Trunk Assessment   Upper Extremity Assessment: Defer to OT evaluation            Lower Extremity Assessment: Generalized weakness      Cervical / Trunk Assessment: Normal  Communication   Communication: No difficulties  Cognition Arousal/Alertness: Awake/alert Behavior During Therapy: WFL for tasks assessed/performed Overall Cognitive Status: No family/caregiver present to determine baseline cognitive functioning Area of Impairment: Orientation;Safety/judgement Orientation Level: Disoriented to;Place;Time       Safety/Judgement: Decreased awareness of safety;Decreased awareness of deficits     General Comments: Pt thought he was in his own home multiple times throughout the session.     General Comments General comments (skin integrity, edema, etc.): Pt was educated on bed-level HEP including SLR, SAQ, quad sets    Exercises        Assessment/Plan    PT Assessment Patient needs continued PT services  PT Diagnosis Difficulty walking;Generalized weakness   PT Problem List Decreased strength;Decreased range of motion;Decreased balance;Decreased activity tolerance;Decreased mobility;Decreased knowledge of use of DME;Decreased safety awareness;Decreased knowledge of precautions  PT Treatment Interventions DME instruction;Gait training;Stair training;Functional mobility training;Therapeutic activities;Therapeutic exercise;Neuromuscular re-education;Patient/family education   PT Goals (Current goals can be found in the Care Plan section) Acute Rehab PT Goals Patient Stated Goal: Be able to walk again PT Goal Formulation: With patient Time For Goal Achievement: 08/29/14 Potential to Achieve Goals: Good    Frequency Min 3X/week   Barriers to discharge        Co-evaluation               End of Session Equipment Utilized During Treatment: Gait belt Activity Tolerance: Patient limited by fatigue Patient left: in bed;with call bell/phone within reach;with bed alarm set Nurse Communication: Mobility status         Time: [id number] PT Time  Calculation (min) (ACUTE ONLY): 25 min   Charges:   PT Evaluation $Initial PT Evaluation Tier I: 1 Procedure PT Treatments $Therapeutic Activity: 8-22 mins   PT G Codes:        Rolinda Roan 09-07-2014, 3:25 PM  Rolinda Roan, PT, DPT Acute Rehabilitation Services Pager: [phone]

## 2014-08-22 NOTE — Progress Notes (Addendum)
PULMONARY / CRITICAL CARE MEDICINE   Name: Daniel Whitney MRN: [id number] DOB: 02/07/1956    ADMISSION DATE:  08/13/2014 CONSULTATION DATE:  08/13/2014  REFERRING MD :  Dr. Lita Mains  CHIEF COMPLAINT: Asystole  INITIAL PRESENTATION:  59 year old male with [facility] WITNESs (will not take blood products) , ESRD on HD had missed his last 2 dialysis treatments. 7/01 early AM he developed acute onset SOB and then became unresponsive. EMS noted asystole and began CPR. Total downtime estimated at about 20 mins. In ED he had several runs of VT, but was otherwise was hemodynamically stable on ventilator. PCCM to admit.  STUDIES:  7/01 CT head: NAICP 7/01 EEG: This sedated EEG is abnormal due to moderate diffuse slowing and suppression of the background 7/01 TTE: LVEF 20% 7/1 cardiac arrest asystole > to ICU. Arcitc sun protocol 7/2- HD, rewarm  08/15/14: :peep to 5 successful last night 08/16/14: SEfl extubated and promptly reintubated 08/17/14: Self extubated yesterday while off dipriva and promptly reintubated.  Following commands per RN. On low dose levophed. HD ongoing.  7/6- daily hd schedule changed 7./8/16: awake 08/21/2014: Doing SBT on low dose diprivan. HD ongoing -> EXTUBATED   SUBJECTIVE/OVERNIGHT/INTERVAL HX 08/22/14: doing well post extubaiton. Feels well. And strong. Wants to go home. Failed by speech yesterday for swallow - has barium pending but he and RN  thinks he can swallow well. Lab draws being limited to avoid excess phelobotomy. D/w RN - RN thinks he meets criteria for floor transfer     VITAL SIGNS: Temp:  [98.3 F (36.8 C)-100.6 F (38.1 C)] 98.3 F (36.8 C) (07/10 0335) Pulse Rate:  [30-87] 81 (07/10 0700) Resp:  [13-40] 33 (07/10 0700) BP: (90-130)/(29-66) 130/65 mmHg (07/10 0700) SpO2:  [85 %-100 %] 100 % (07/10 0700) FiO2 (%):  [40 %] 40 % (07/09 1119) Weight:  [104 kg (229 lb 4.5 oz)-104.1 kg (229 lb 8 oz)] 104.1 kg (229 lb 8 oz) (07/10 0200) HEMODYNAMICS:    VENTILATOR SETTINGS: Vent Mode:  [-] CPAP;PSV FiO2 (%):  [40 %] 40 % PEEP:  [5 cmH20] 5 cmH20 Pressure Support:  [5 cmH20] 5 cmH20 INTAKE / OUTPUT:  Intake/Output Summary (Last 24 hours) at 08/22/14 0827 Last data filed at 08/22/14 0700  Gross per 24 hour  Intake  505.5 ml  Output   2555 ml  Net -2049.5 ml    PHYSICAL EXAMINATION: General:Looks significantly better. Near normal conditioning look Neuro:  PERRL,  RASS 0., Motor 4+/5, CAM-ICU neg for delirium. Moves all 4s HEENT:  JVD Cardiovascular:  s1 s2 RRR Lungs:  CTA, Doin SBT on dlow dose diprivan without problem Abdomen:  Obese, soft, non-distended Ext:  +1 symmetric BLE edema, not pitting - edema improved Skin:  Grossly intact  LABS: PULMONARY  Recent Labs Lab 08/15/14 1123 08/16/14 0500 08/20/14 1224  PHART 7.330* 7.352 7.323*  PCO2ART 53.0* 48.8* 62.3*  PO2ART 60.0* 82.1 86.0  HCO3 27.9* 26.3* 32.3*  TCO2 30 27.8 34  O2SAT 88.0 94.8 95.0    CBC  Recent Labs Lab 08/18/14 0520 08/20/14 0450 08/22/14 0459  HGB 9.6* 10.0* 10.8*  HCT 30.1* 32.3* 35.5*  WBC 13.0* 11.4* 11.2*  PLT 191 199 224    COAGULATION  Recent Labs Lab 08/18/14 1650 08/20/14 0450  INR 1.97* 2.07*    CARDIAC   No results for input(s): TROPONINI in the last 168 hours. No results for input(s): PROBNP in the last 168 hours.   CHEMISTRY  Recent Labs Lab 08/16/14 1117  08/17/14 0335 08/18/14 0520 08/18/14 1735 08/19/14 0410 08/20/14 0450  NA 137 136 137  --  137 135  K 4.2 4.1 4.3  --  4.4 3.6  CL 99* 97* 98*  --  99* 96*  CO2 25 25 28   --  29 31  GLUCOSE 147* 119* 126*  --  148* 135*  BUN 69* 90* 77*  --  57* 53*  CREATININE 7.74* 8.56* 6.04*  --  4.50* 4.09*  CALCIUM 8.6* 8.6* 8.7*  --  8.9 9.2  MG  --   --   --  1.9  --   --   PHOS 6.4* 7.3* 5.4*  --   --  4.0   Estimated Creatinine Clearance: 21.7 mL/min (by C-G formula based on Cr of 4.09).   LIVER  Recent Labs Lab 08/16/14 1117 08/17/14 0335  08/18/14 0520 08/18/14 1650 08/20/14 0450 08/21/14 0345  AST  --   --   --   --   --  18  ALT  --   --   --   --   --  15*  ALKPHOS  --   --   --   --   --  173*  BILITOT  --   --   --   --   --  0.8  PROT  --   --   --   --   --  6.1*  ALBUMIN 2.2* 2.1* 2.1*  --  2.3* 2.3*  INR  --   --   --  1.97* 2.07*  --      INFECTIOUS No results for input(s): LATICACIDVEN, PROCALCITON in the last 168 hours.   ENDOCRINE CBG (last 3)   Recent Labs  08/21/14 1954 08/21/14 2346 08/22/14 0403  GLUCAP 109* 117* 118*     IMAGING x48h  - image(s) personally visualized  -   highlighted in bold Dg Chest Port 1 View  08/22/2014   CLINICAL DATA:  Adult respiratory distress syndrome. Recent cardiac arrest and respiratory failure. Recent extubation.  EXAM: PORTABLE CHEST - 1 VIEW  COMPARISON:  08/20/2014  FINDINGS: Endotracheal tube and nasogastric tube have been removed. Left subclavian central venous catheter remains in appropriate position. No pneumothorax visualized.  Decreased bibasilar atelectasis is seen compared to previous study. Cardiomegaly and diffuse pulmonary interstitial prominence remain stable. No evidence of pulmonary consolidation.  IMPRESSION: Decreased bibasilar atelectasis.  Stable cardiomegaly and diffuse pulmonary interstitial prominence.   Electronically Signed   By: Earle Gell M.D.   On: 08/22/2014 07:51    ASSESSMENT / PLAN:  PULMONARY ETT 7/1 >> 08/16/14, 7/4 (self extubated and reintubated) >>08/31/14 A: #Baseline OSA/OHS - dx years ago per patient . Not used CPAP in years but suppoesd to  #Admit issue Acute respiratory failure s/p cardiac arrest - Pulm edema vs ARDS - Rapid failed extubation 08/16/14, s/p successful extubaiton 08/21/14  #Currentl  - doing well from resp stand point post extubation  P:   pulmtoilet o2 for pulse ox > 88% OPD sleep MD refrral    CARDIOVASCULAR L [place] CVL 7/01 >>  A:  #Baseline Severe cardiomyopathy H/O AF (sinus presently) -  coumadin H/o HTN, CAD  #Admit Asystolic cardiac arrest Acute on chronic CHF  #currently  - not on pressors  P:  Telemetry monitoring Map 55 with good MS goal IV heparin gtt for A Fib - pharm monitoring  RENAL A:   #Baseline ESRD on HD - Noncompliance with HD - MWF  #Admit  Acidosis and Hyperkalemia, resolved   P:   VOlume removal per Renal via HD   GASTROINTESTINAL A:   High residuals and eMD started on reglan 08/17/14  But stopped reglan due to long QTc 08/17/14.   - back on NPO after extubation 08/21/14, FAilld swallow. Barium pending. SO, post extubation DYSPHAGIA +  P:   REcall speech - he might not need barium and might passs swallow 08/22/2014 - RN calling SUP: IV PPI Avoid reglan (QTc 523msec 08/17/14 )    HEMATOLOGIC A:   #[employer]ehovah's witness - family confirms 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  # Anemia of chronic illness  Excessive warfarin anticoagulation - at admit  #Current  - unchanged anemia  P:  Continued hep drip LIMIT  draws when able [facility] witness) Iron supplement   INFECTIOUS A:   No evidence of infection P:   Follow fever curve   ENDOCRINE A:   DM 2 R/o sick euthyroid vs hyper P:   CBG monitoring and SSI Would repeat thyroid fxn in a few weeks  NEUROLOGIC A:   #Baseline L3 fracture (chronic)  #ADmit Acute anoxic encephalopathy    #Currently  - normal mental status 08/22/2014   P:   Monitor clinically  FAMILY  - Updates: none at bedside 08/17/14 and 08/18/14 and 08/21/2014 ,08/22/14. PAtient updated 08/22/2014   - Inter-disciplinary family meet or Palliative Care meeting due by:  7/8   GLOBAL - cardiac arrest after missing HD and meds. Now s/p arctic sun. Doing well. Deconditoned and failed swallow. Move to floor. Pharmacy to change to coumadin. Await swallow clearance and Pt consult. Limit blood draws due to faith related blood conservation needs. NOTED LOS 9 and CVL is 37 days old . HE is tough stick so left in. TRH to remove i asap when  feasible    Dr. Brand Males, M.D., [facility]C.P Pulmonary and Critical Care Medicine Staff Physician [facility] Pager: [phone], If no answer or between  15:00h - 7:00h: call [phone]  08/22/2014 8:27 AM

## 2014-08-22 NOTE — Progress Notes (Signed)
  Daniel Whitney Progress Note   Subjective: extubated and moved to floor bed.  Feels a lot better, no sob or coughing. Sore chest from "CPR".   Filed Vitals:   08/22/14 0653 08/22/14 0700 08/22/14 0800 08/22/14 1000  BP:  130/65 123/68 123/65  Pulse: 84 81 80 81  Temp:    98.8 F (37.1 C)  TempSrc:    Oral  Resp: 27 33 25 18  Height:    5\' 9"  (1.753 m)  Weight:    103.4 kg (227 lb 15.3 oz)  SpO2: 100% 100% 100% 91%   Exam: Alert, no distress, lying flat No jvd Chest clear bilat RRR no RG Abd soft , obese, ntnd No LE or UE edema L arm AVF patent Neuro moving all ext but weak  TTS [place] 4h  EDW 119kg will have lower EDW at discharge  LFA AVF Heparin 3600 450/800 Hect 4 , Venofer 50/wk, mircera 75 q2wks last 6/21       Assessment: 1. Asystole/ cardiac arrest/ severe CM EF 20%- s/p cooling protocol. Per primary 2. Pulm edema severe/ vol excess- resolved, down 16 kg from admission 3. ESRD on HD 4. 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 DM 2 7. Afib on IV hep now 8. HTN not on any meds here, (coreg/ valsartan at home ), BP good  Plan - next HD Tuesday, new dry wt around 104kg for now    Kelly Splinter MD  pager [phone]    cell [phone]  08/22/2014, 11:11 AM     Recent Labs Lab 08/17/14 0335 08/18/14 0520 08/19/14 0410 08/20/14 0450  NA 136 137 137 135  K 4.1 4.3 4.4 3.6  CL 97* 98* 99* 96*  CO2 25 28 29 31   GLUCOSE 119* 126* 148* 135*  BUN 90* 77* 57* 53*  CREATININE 8.56* 6.04* 4.50* 4.09*  CALCIUM 8.6* 8.7* 8.9 9.2  PHOS 7.3* 5.4*  --  4.0    Recent Labs Lab 08/18/14 0520 08/20/14 0450 08/21/14 0345  AST  --   --  18  ALT  --   --  15*  ALKPHOS  --   --  173*  BILITOT  --   --  0.8  PROT  --   --  6.1*  ALBUMIN 2.1* 2.3* 2.3*    Recent Labs Lab 08/17/14 0334 08/18/14 0520 08/20/14 0450 08/22/14 0459  WBC 13.4* 13.0* 11.4* 11.2*  NEUTROABS 10.8*  --  8.4*  --   HGB 9.2*  9.6* 10.0* 10.8*  HCT 27.7* 30.1* 32.3* 35.5*  MCV 90.5 92.9 94.7 97.8  PLT 181 191 199 224   . sodium chloride   Intravenous Once  . antiseptic oral rinse  7 mL Mouth Rinse q12n4p  . darbepoetin (ARANESP) injection - DIALYSIS  100 mcg Intravenous Q Tue-HD  . ferric gluconate (FERRLECIT/NULECIT) IV  62.5 mg Intravenous Q Thu-HD  . insulin aspart  0-9 Units Subcutaneous 6 times per day  . pantoprazole (PROTONIX) IV  40 mg Intravenous QHS  . sodium chloride  10-40 mL Intracatheter Q12H   . heparin 1,850 Units/hr (08/22/14 0254)   heparin, heparin, sodium chloride

## 2014-08-22 NOTE — Progress Notes (Signed)
ANTICOAGULATION CONSULT NOTE - Follow Up Consult  Pharmacy Consult for heparin/warfarin Indication: atrial fibrillation  No Known Allergies  Patient Measurements: Height: 5\' 9"  (175.3 cm) Weight: 227 lb 15.3 oz (103.4 kg) IBW/kg (Calculated) : 70.7  Vital Signs: Temp: 98.6 F (37 C) (07/10 1424) Temp Source: Oral (07/10 1424) BP: 123/62 mmHg (07/10 1424) Pulse Rate: 81 (07/10 1424)  Labs:  Recent Labs  08/20/14 0450 08/21/14 0345 08/22/14 0400 08/22/14 0459  HGB 10.0*  --   --  10.8*  HCT 32.3*  --   --  35.5*  PLT 199  --   --  224  APTT 121*  --   --   --   LABPROT 23.2*  --   --   --   INR 2.07*  --   --   --   HEPARINUNFRC 0.30 0.28* 0.54  --   CREATININE 4.09*  --   --   --     Estimated Creatinine Clearance: 22.8 mL/min (by C-G formula based on Cr of 4.09).   Medications:  Prescriptions prior to admission  Medication Sig Dispense Refill Last Dose  . acetaminophen (TYLENOL) 650 MG CR tablet Take 650 mg by mouth every 8 (eight) hours as needed for pain.   08/09/2014  . allopurinol (ZYLOPRIM) 100 MG tablet TAKE ONE TABLET BY MOUTH TWICE DAILY 180 tablet 0 08/11/2014  . amiodarone (PACERONE) 200 MG tablet Take 1 tablet (200 mg total) by mouth daily. 90 tablet 1 08/11/2014  . amitriptyline (ELAVIL) 50 MG tablet TAKE ONE TABLET BY MOUTH ONCE DAILY AT BEDTIME 90 tablet 0 08/11/2014  . carvedilol (COREG) 25 MG tablet Take 25 mg by mouth 2 (two) times daily with a meal.   08/11/2014 at 1800  . cetirizine (ZYRTEC) 10 MG tablet Take 1 tablet (10 mg total) by mouth daily. 30 tablet 11 08/11/2014  . docusate sodium (COLACE) 100 MG capsule Take 100 mg by mouth daily.   08/11/2014  . hydrOXYzine (ATARAX/VISTARIL) 25 MG tablet Take 25 mg by mouth 3 (three) times daily as needed for itching.   unknown  . JANUVIA 25 MG tablet TAKE ONE TABLET BY MOUTH ONCE DAILY 90 tablet 0 08/11/2014  . loperamide (IMODIUM A-D) 2 MG tablet Take 2 mg by mouth 4 (four) times daily as needed for diarrhea  or loose stools.   unknown  . ondansetron (ZOFRAN) 4 MG tablet Take 1 tablet (4 mg total) by mouth every 6 (six) hours. (Patient taking differently: Take 4 mg by mouth every 6 (six) hours as needed for nausea or vomiting. ) 12 tablet 0 unknown  . OVER THE COUNTER MEDICATION 1 application 2 (two) times daily. Melaquin Complex   08/11/2014  . predniSONE (DELTASONE) 10 MG tablet Take 10-30 mg by mouth as directed. Taper (3 3 3 2 2  2, 1 daily)   08/11/2014  . ranitidine (ZANTAC) 150 MG tablet Take 150 mg by mouth daily as needed for heartburn.   unknown  . sevelamer carbonate (RENVELA) 800 MG tablet Take 1,600 mg by mouth 3 (three) times daily with meals.    08/11/2014  . simvastatin (ZOCOR) 20 MG tablet TAKE ONE TABLET BY MOUTH ONCE DAILY 90 tablet 0 08/11/2014  . sodium chloride (OCEAN) 0.65 % SOLN nasal spray Place 1 spray into both nostrils as needed for congestion. 104 mL 0 unknown  . tadalafil (CIALIS) 20 MG tablet Take 1 tablet (20 mg total) by mouth daily as needed for erectile dysfunction. 5 tablet 11 unknown  .  TraMADol HCl 300 MG CP24 TAKE ONE CAPSULE BY MOUTH ONCE DAILY 90 capsule 2 08/11/2014  . valsartan (DIOVAN) 40 MG tablet Take 40 mg by mouth at bedtime.    08/11/2014  . Vitamin D, Ergocalciferol, (DRISDOL) 50000 UNITS CAPS capsule Take 1 capsule (50,000 Units total) by mouth every 7 (seven) days. 8 capsule 0 Past Week at Unknown time  . warfarin (COUMADIN) 3 MG tablet Take 1-1.5 tablets (3-4.5 mg total) by mouth See admin instructions. Please hold Coumadin 01/31/14-take 0.5 tablet on 02/01/14-and then resume usual dosing of on Thursday 1.5 tablet and on the remaining days you take 1 tabletTake as directed by anticoagulation clinic. Please make sure you check your INR in the next 2 days (Patient taking differently: Take 3-4.5 mg by mouth daily at 6 PM. On Thursdays take 1.5 tablet (4.5 mg) and on the remaining days take 1 tablet (3 mg)) 20 tablet 0 08/11/2014  . albuterol (PROVENTIL HFA;VENTOLIN  HFA) 108 (90 BASE) MCG/ACT inhaler Inhale 2 puffs into the lungs every 6 (six) hours as needed for wheezing or shortness of breath. (Patient not taking: Reported on 08/13/2014) 1 Inhaler 0 Not Taking at Unknown time  . calcitonin, salmon, (MIACALCIN) 200 UNIT/ACT nasal spray Place 1 spray into alternate nostrils daily. (Patient not taking: Reported on 08/13/2014) 3.7 mL 0 Not Taking at Unknown time  . clindamycin (CLEOCIN T) 1 % lotion Apply topically 2 (two) times daily. (Patient not taking: Reported on 08/13/2014) 60 mL 0 Not Taking at Unknown time  . dextromethorphan-guaiFENesin (MUCINEX DM) 30-600 MG per 12 hr tablet Take 1 tablet by mouth 2 (two) times daily. (Patient not taking: Reported on 08/13/2014) 10 tablet 0 Not Taking at Unknown time  . fluticasone (FLONASE) 50 MCG/ACT nasal spray Place 2 sprays into both nostrils daily. (Patient not taking: Reported on 08/13/2014) 16 g 2 Not Taking at Unknown time  . HYDROcodone-acetaminophen (NORCO/VICODIN) 5-325 MG per tablet Take 1 tablet by mouth every 4 (four) hours as needed. (Patient not taking: Reported on 08/13/2014) 15 tablet 0 Not Taking at Unknown time  . lidocaine (LIDODERM) 5 % Place 1 patch onto the skin daily. Remove & Discard patch within 12 hours or as directed by MD (Patient not taking: Reported on 08/13/2014) 30 patch 0 Not Taking at Unknown time  . loratadine (CLARITIN) 10 MG tablet Take 1 tablet (10 mg total) by mouth daily. (Patient not taking: Reported on 08/13/2014) 5 tablet 0 Not Taking at Unknown time  . neomycin-polymyxin-hydrocortisone (CORTISPORIN) otic solution Place 4 drops into the right ear 4 (four) times daily. (Patient not taking: Reported on 08/13/2014) 10 mL 0 Not Taking at Unknown time  . oxyCODONE-acetaminophen (PERCOCET) 5-325 MG per tablet Take 1-2 tablets by mouth every 4 (four) hours as needed. (Patient not taking: Reported on 08/13/2014) 40 tablet 0 Not Taking at Unknown time  . triamcinolone (NASACORT AQ) 55 MCG/ACT AERO nasal  inhaler Place 2 sprays into the nose daily. (Patient not taking: Reported on 08/13/2014) 1 Inhaler 12 Not Taking at Unknown time    Assessment:  59 y/o M admitted 07/01 w/ acute SOB, and then became unresponsive. On warfarin PTA; INR on admission 6.82. Vit K IV on 07/01, and 07/08. Warfarin has been on hold and has been on heparin. Most recent INR 2.07 on 07/08. AM HL therapeutic 0.54. Hgb and plts WNL.   Spoke w/ Dr. Chase Caller who ok'd restarting warfarin, will continue heparin   Goal of Therapy:  INR 2-3, heparin level 0.3 -  0.7 Monitor platelets by anticoagulation protocol: Yes   Plan:  Continue heparin 1850 units/hr Daily HL/CBC Restart warfarin 2mg  daily Daily INR   Angela Burke, PharmD Pharmacy Resident 08/22/2014,2:42 PM

## 2014-08-22 NOTE — Progress Notes (Signed)
Speech Language Pathology Treatment: Dysphagia  Patient Details Name: Daniel Whitney MRN: [id number] DOB: 1955/06/27 Today's Date: 08/22/2014 Time: 9978-7765 SLP Time Calculation (min) (ACUTE ONLY): 17 min  Assessment / Plan / Recommendation Clinical Impression  Planned for MBS as recommended, however RN and MD felt pt had dramatically improved so SLP reassessed at bedside. Pt fully alert, mildly dysphonic. Over 8oz of thin liquids and trials of regular solids, swallow function subjectively normal with no signs of aspiration. Recommend pt initiate a regular diet and thin liquids with basic aspiration precautions. Will cancel MBS, no SLP f/u needed, will sign off.    HPI Other Pertinent Information: Pt is a 59  year old male with ESRD on HD who had missed his last 2 dialysis treatments. 7/01 early AM he developed acute onset SOB and then became unresponsive. EMS noted asystole and began CPR. Total downtime estimated at about 20 mins. In ED he had several runs of VT, but was otherwise was hemodynamically stable on ventilator. PCCM to admit. Pt intubated upon admission with periods of self exutbation and prompt reintubation. This date exutabted by respiratory therapy. Most recent Chest x ray showed bibasilar atelectasis or PNA with pulmonary venous congestion.    Pertinent Vitals Pain Assessment: No/denies pain  SLP Plan  All goals met;Discharge SLP treatment due to (comment)    Recommendations Diet recommendations: Regular;Thin liquid Liquids provided via: Cup;Straw Medication Administration: Whole meds with liquid Supervision: Patient able to self feed              Plan: All goals met;Discharge SLP treatment due to (comment)    GO    Herbie Baltimore, MA CCC-SLP [phone]  Lynann Beaver 08/22/2014, 8:48 AM

## 2014-08-23 ENCOUNTER — Inpatient Hospital Stay (HOSPITAL_COMMUNITY): Payer: Medicare Other

## 2014-08-23 DIAGNOSIS — J9601 Acute respiratory failure with hypoxia: Secondary | ICD-10-CM

## 2014-08-23 LAB — HEPARIN LEVEL (UNFRACTIONATED): Heparin Unfractionated: 2 IU/mL — ABNORMAL HIGH (ref 0.30–0.70)

## 2014-08-23 LAB — GLUCOSE, CAPILLARY
GLUCOSE-CAPILLARY: 33 mg/dL — AB (ref 65–99)
GLUCOSE-CAPILLARY: 114 mg/dL — AB (ref 65–99)
Glucose-Capillary: 113 mg/dL — ABNORMAL HIGH (ref 65–99)
Glucose-Capillary: 117 mg/dL — ABNORMAL HIGH (ref 65–99)
Glucose-Capillary: 122 mg/dL — ABNORMAL HIGH (ref 65–99)
Glucose-Capillary: 133 mg/dL — ABNORMAL HIGH (ref 65–99)
Glucose-Capillary: 145 mg/dL — ABNORMAL HIGH (ref 65–99)

## 2014-08-23 LAB — PROTIME-INR
INR: 2.03 — AB (ref 0.00–1.49)
PROTHROMBIN TIME: 22.8 s — AB (ref 11.6–15.2)

## 2014-08-23 MED ORDER — INSULIN ASPART 100 UNIT/ML ~~LOC~~ SOLN
0.0000 [IU] | Freq: Three times a day (TID) | SUBCUTANEOUS | Status: DC
  Administered 2014-08-24: 1 [IU] via SUBCUTANEOUS
  Administered 2014-08-25: 2 [IU] via SUBCUTANEOUS

## 2014-08-23 MED ORDER — ACETAMINOPHEN 325 MG PO TABS
650.0000 mg | ORAL_TABLET | Freq: Four times a day (QID) | ORAL | Status: DC | PRN
  Administered 2014-08-23 – 2014-08-24 (×2): 650 mg via ORAL
  Filled 2014-08-23 (×3): qty 2

## 2014-08-23 MED ORDER — WARFARIN SODIUM 2 MG PO TABS
2.0000 mg | ORAL_TABLET | Freq: Once | ORAL | Status: AC
  Administered 2014-08-23: 2 mg via ORAL
  Filled 2014-08-23: qty 1

## 2014-08-23 NOTE — Progress Notes (Signed)
ANTICOAGULATION CONSULT NOTE - Follow Up Consult  Pharmacy Consult for Warfarin  Indication: atrial fibrillation  No Known Allergies  Patient Measurements: Height: 5\' 9"  (175.3 cm) Weight: 231 lb 4.8 oz (104.917 kg) (bed scale) IBW/kg (Calculated) : 70.7   Vital Signs: Temp: 99.4 F (37.4 C) (07/11 0242) Temp Source: Oral (07/11 0242) BP: 116/64 mmHg (07/11 0242) Pulse Rate: 81 (07/11 0242)  Labs:  Recent Labs  08/21/14 0345 08/22/14 0400 08/22/14 0459 08/23/14 0349  HGB  --   --  10.8*  --   HCT  --   --  35.5*  --   PLT  --   --  224  --   LABPROT  --   --   --  22.8*  INR  --   --   --  2.03*  HEPARINUNFRC 0.28* 0.54  --  2.00*    Estimated Creatinine Clearance: 22.9 mL/min (by C-G formula based on Cr of 4.09).   Medications:  Scheduled:  . sodium chloride   Intravenous Once  . antiseptic oral rinse  7 mL Mouth Rinse q12n4p  . darbepoetin (ARANESP) injection - DIALYSIS  100 mcg Intravenous Q Tue-HD  . ferric gluconate (FERRLECIT/NULECIT) IV  62.5 mg Intravenous Q Thu-HD  . insulin aspart  0-9 Units Subcutaneous 6 times per day  . pantoprazole (PROTONIX) IV  40 mg Intravenous QHS  . sodium chloride  10-40 mL Intracatheter Q12H  . Warfarin - Pharmacist Dosing Inpatient   Does not apply q1800   Infusions:   PRN: heparin, heparin, sodium chloride  Assessment: 59 year old male with ESRD, Afib, CHF admitted 08/13/2014 w/ acute SOB and then became unresponsive with cardiac arrest.  On warfarin PTA (4 mg Th/3 mg AOD), INR on admission 6.82 (vit K 5mg  IV on 08/13/14 and 2mg  IV on 7/8). Pt anticoagulated with heparin and warfarin held. Warfarin 2 mg resumed on 7/10.  On 08/23/14 Heparin d/c as INR therapuetic; INR 2.03,  PLTs 224, Hgb 10.8  Goal of Therapy:  INR 2-3 Monitor platelets by anticoagulation protocol: Yes   Plan:  -Repeat Warfarin 2 mg x1 Daily INR  Kem Parkinson, PharmD Pharmacy Resident 08/23/2014,9:31 AM

## 2014-08-23 NOTE — Progress Notes (Signed)
eLink Physician-Brief Progress Note Patient Name: Daniel Whitney DOB: 1955/03/06 MRN: [id number]   Date of Service  08/23/2014  HPI/Events of Note  Non-contrasted head CT negative for any acute findings.   eICU Interventions  Plan: Rounding team to consider neuro consult if left UE/LE weakness persists     Intervention Category Intermediate Interventions: Diagnostic test evaluation  DETERDING,ELIZABETH 08/23/2014, 1:35 AM

## 2014-08-23 NOTE — Consult Note (Signed)
Physical Medicine and Rehabilitation Consult Reason for Consult: Asystole/cardiac arrest/multi-medical Referring Physician: [facility]   HPI: Daniel Whitney is a 59 y.o. right handed child over witness male with history of hypertension, diastolic congestive heart failure, morbid obesity, end-stage renal disease with hemodialysis, atrial fibrillation maintained on Coumadin. Patient lives with his brothers used a cane and/or walker prior to admission. Admitted 08/13/2014 after acute onset of shortness of breath became unresponsive. CPR initiated 20 minutes intubated and placed on ventilator. Cranial CT scan negative. Chest x-ray bilateral pulmonary edema. EEG with moderate diffuse slowing no seizure activity. Echocardiogram with ejection fraction of 20% with grade 2 diastolic dysfunction. Chronic Coumadin ongoing. Hemodialysis as per renal services. Tolerating a regular consistency diet. Physical and occupational therapy evaluations completed with recommendations of physical medicine rehabilitation consult.   Review of Systems  Constitutional: Negative for fever and chills.  HENT: Negative for hearing loss.   Eyes: Negative for double vision.  Respiratory: Positive for cough and shortness of breath.   Cardiovascular: Positive for palpitations and leg swelling.  Gastrointestinal: Positive for heartburn and constipation.       GERD  Musculoskeletal: Positive for myalgias and back pain.  Skin: Negative for rash.  Neurological: Positive for dizziness and weakness. Negative for seizures and headaches.   Past Medical History  Diagnosis Date  . Glaucoma   . Hypertension   . Hyperlipidemia   . Atrial fibrillation 12/30/2012  . CHF (congestive heart failure) 12/30/2012  . Diabetic retinopathy 12/30/2012  . Morbid obesity 12/30/2012  . Pulmonary hypertension 12/30/2012  . Myocardial infarction     "I've had ~ 3; last one was in ~ 10/2013" (01/30/2014)  . Diabetes mellitus with neuropathy   .  OSA (obstructive sleep apnea) 12/30/2012    "lost weight; no longer needed CPAP; retested said I needed it; didn't followup cause I was feeling fine" (01/30/2014)  . GERD (gastroesophageal reflux disease)   . Stroke ~ 2005; ~ 2005    "they were mild; I didn't even notice I'd had them"; denies residual on 01/30/2014  . History of gout     "right big toe"  . Arthritis     "all over"  . Chronic lower back pain   . ESRD (end stage renal disease) on dialysis started in 2013    "MWF; [facility]; [address]" (01/30/2014)  . Refusal of blood transfusions as patient is Jehovah's Witness   . Anemia due to other cause 07/07/2014  . Allergic rhinitis 07/07/2014   Past Surgical History  Procedure Laterality Date  . Total hip arthroplasty Right 1980  . Av fistula placement Left ~ 10/2013    "forearm"  . Closed reduction hip dislocation Right 1970's   Family History  Problem Relation Age of Onset  . Arthritis Other   . Heart disease Other   . Hyperlipidemia Other   . Stroke Other   . Hypertension Other   . Kidney disease Other   . Diabetes Other    Social History:  reports that he has never smoked. He has never used smokeless tobacco. He reports that he drinks about 1.5 - 2.0 oz of alcohol per week. He reports that he does not use illicit drugs. Allergies: No Known Allergies Medications Prior to Admission  Medication Sig Dispense Refill  . acetaminophen (TYLENOL) 650 MG CR tablet Take 650 mg by mouth every 8 (eight) hours as needed for pain.    Marland Kitchen allopurinol (ZYLOPRIM) 100 MG tablet TAKE ONE TABLET BY MOUTH TWICE  DAILY 180 tablet 0  . amiodarone (PACERONE) 200 MG tablet Take 1 tablet (200 mg total) by mouth daily. 90 tablet 1  . amitriptyline (ELAVIL) 50 MG tablet TAKE ONE TABLET BY MOUTH ONCE DAILY AT BEDTIME 90 tablet 0  . carvedilol (COREG) 25 MG tablet Take 25 mg by mouth 2 (two) times daily with a meal.    . cetirizine (ZYRTEC) 10 MG tablet Take 1 tablet (10 mg total) by mouth daily.  30 tablet 11  . docusate sodium (COLACE) 100 MG capsule Take 100 mg by mouth daily.    . hydrOXYzine (ATARAX/VISTARIL) 25 MG tablet Take 25 mg by mouth 3 (three) times daily as needed for itching.    Marland Kitchen JANUVIA 25 MG tablet TAKE ONE TABLET BY MOUTH ONCE DAILY 90 tablet 0  . loperamide (IMODIUM A-D) 2 MG tablet Take 2 mg by mouth 4 (four) times daily as needed for diarrhea or loose stools.    . ondansetron (ZOFRAN) 4 MG tablet Take 1 tablet (4 mg total) by mouth every 6 (six) hours. (Patient taking differently: Take 4 mg by mouth every 6 (six) hours as needed for nausea or vomiting. ) 12 tablet 0  . OVER THE COUNTER MEDICATION 1 application 2 (two) times daily. Melaquin Complex    . predniSONE (DELTASONE) 10 MG tablet Take 10-30 mg by mouth as directed. Taper (3 3 3 2 2  2, 1 daily)    . ranitidine (ZANTAC) 150 MG tablet Take 150 mg by mouth daily as needed for heartburn.    . sevelamer carbonate (RENVELA) 800 MG tablet Take 1,600 mg by mouth 3 (three) times daily with meals.     . simvastatin (ZOCOR) 20 MG tablet TAKE ONE TABLET BY MOUTH ONCE DAILY 90 tablet 0  . sodium chloride (OCEAN) 0.65 % SOLN nasal spray Place 1 spray into both nostrils as needed for congestion. 104 mL 0  . tadalafil (CIALIS) 20 MG tablet Take 1 tablet (20 mg total) by mouth daily as needed for erectile dysfunction. 5 tablet 11  . TraMADol HCl 300 MG CP24 TAKE ONE CAPSULE BY MOUTH ONCE DAILY 90 capsule 2  . valsartan (DIOVAN) 40 MG tablet Take 40 mg by mouth at bedtime.     . Vitamin D, Ergocalciferol, (DRISDOL) 50000 UNITS CAPS capsule Take 1 capsule (50,000 Units total) by mouth every 7 (seven) days. 8 capsule 0  . warfarin (COUMADIN) 3 MG tablet Take 1-1.5 tablets (3-4.5 mg total) by mouth See admin instructions. Please hold Coumadin 01/31/14-take 0.5 tablet on 02/01/14-and then resume usual dosing of on Thursday 1.5 tablet and on the remaining days you take 1 tabletTake as directed by anticoagulation clinic. Please make sure  you check your INR in the next 2 days (Patient taking differently: Take 3-4.5 mg by mouth daily at 6 PM. On Thursdays take 1.5 tablet (4.5 mg) and on the remaining days take 1 tablet (3 mg)) 20 tablet 0  . albuterol (PROVENTIL HFA;VENTOLIN HFA) 108 (90 BASE) MCG/ACT inhaler Inhale 2 puffs into the lungs every 6 (six) hours as needed for wheezing or shortness of breath. (Patient not taking: Reported on 08/13/2014) 1 Inhaler 0  . calcitonin, salmon, (MIACALCIN) 200 UNIT/ACT nasal spray Place 1 spray into alternate nostrils daily. (Patient not taking: Reported on 08/13/2014) 3.7 mL 0  . clindamycin (CLEOCIN T) 1 % lotion Apply topically 2 (two) times daily. (Patient not taking: Reported on 08/13/2014) 60 mL 0  . dextromethorphan-guaiFENesin (MUCINEX DM) 30-600 MG per 12 hr tablet  Take 1 tablet by mouth 2 (two) times daily. (Patient not taking: Reported on 08/13/2014) 10 tablet 0  . fluticasone (FLONASE) 50 MCG/ACT nasal spray Place 2 sprays into both nostrils daily. (Patient not taking: Reported on 08/13/2014) 16 g 2  . HYDROcodone-acetaminophen (NORCO/VICODIN) 5-325 MG per tablet Take 1 tablet by mouth every 4 (four) hours as needed. (Patient not taking: Reported on 08/13/2014) 15 tablet 0  . lidocaine (LIDODERM) 5 % Place 1 patch onto the skin daily. Remove & Discard patch within 12 hours or as directed by MD (Patient not taking: Reported on 08/13/2014) 30 patch 0  . loratadine (CLARITIN) 10 MG tablet Take 1 tablet (10 mg total) by mouth daily. (Patient not taking: Reported on 08/13/2014) 5 tablet 0  . neomycin-polymyxin-hydrocortisone (CORTISPORIN) otic solution Place 4 drops into the right ear 4 (four) times daily. (Patient not taking: Reported on 08/13/2014) 10 mL 0  . oxyCODONE-acetaminophen (PERCOCET) 5-325 MG per tablet Take 1-2 tablets by mouth every 4 (four) hours as needed. (Patient not taking: Reported on 08/13/2014) 40 tablet 0  . triamcinolone (NASACORT AQ) 55 MCG/ACT AERO nasal inhaler Place 2 sprays into the  nose daily. (Patient not taking: Reported on 08/13/2014) 1 Inhaler 12    Home: Home Living Family/patient expects to be discharged to:: Private residence Living Arrangements: Other relatives Available Help at Discharge: Family, Available 24 hours/day Type of Home: House Home Access: Stairs to enter [employer] of Steps: 6-8 Home Layout: One level Bathroom Shower/Tub: Public librarian, Multimedia programmer: Standard Home Equipment: Environmental consultant - 2 wheels, Cane - single point, Shower seat (has grab bars, but has not installed)  Functional History: Prior Function Level of Independence: Independent with assistive device(s) Comments: States he was using both the walker and the cane to ambulate. Was able to bathe and dress and perform IADL without assist Functional Status:  Mobility: Bed Mobility Overal bed mobility: Needs Assistance Bed Mobility: Supine to Sit, Sit to Supine Supine to sit: Mod assist Sit to supine: Min guard General bed mobility comments: no physical assist Transfers Overall transfer level: Needs assistance Equipment used: Rolling walker (2 wheeled) Transfers: Sit to/from Stand Sit to Stand: Min assist, Min guard General transfer comment: braces LEs on chair for stability when standing Ambulation/Gait General Gait Details: Pt with heavy posterior lean and was not safe to step away from the EOB. Pt was able to complete pre-gait activity including marching in place at EOB with BUE's on RW for support and therapist min A for balance.     ADL: ADL Overall ADL's : Needs assistance/impaired Eating/Feeding: Independent, Sitting Grooming: Wash/dry hands, Wash/dry face, Sitting, Set up Upper Body Bathing: Minimal assitance, Sitting Lower Body Bathing: Minimal assistance, Sit to/from stand Upper Body Dressing : Minimal assistance, Sitting Lower Body Dressing: Minimal assistance, Sit to/from stand Lower Body Dressing Details (indicate cue type and reason):  able to donn and doff socks independently Toilet Transfer: Minimal assistance, Ambulation, RW, BSC Toileting- Clothing Manipulation and Hygiene: Min guard, Sit to/from stand Functional mobility during ADLs: Minimal assistance, Rolling walker (followed with chair)  Cognition: Cognition Overall Cognitive Status: No family/caregiver present to determine baseline cognitive functioning Orientation Level: Oriented X4 Cognition Arousal/Alertness: Awake/alert Behavior During Therapy: WFL for tasks assessed/performed Overall Cognitive Status: No family/caregiver present to determine baseline cognitive functioning Area of Impairment: Safety/judgement Orientation Level: Disoriented to, Place, Time Safety/Judgement: Decreased awareness of safety, Decreased awareness of deficits General Comments: Pt with tangential conversation.  Blood pressure 138/59, pulse 79, temperature 99.4  F (37.4 C), temperature source Oral, resp. rate 18, height 5\' 9"  (1.753 m), weight 104.917 kg (231 lb 4.8 oz), SpO2 100 %. Physical Exam  Vitals reviewed. Constitutional:  59 year old right-handed obese male  HENT:  Head: Normocephalic.  Eyes: EOM are normal.  Neck: Normal range of motion. Neck supple. No thyromegaly present.  Cardiovascular:  Cardiac rate controlled  Respiratory: Effort normal and breath sounds normal.  GI: Soft. Bowel sounds are normal. He exhibits distension.  Neurological: He is alert.  Patient is oriented to name, place and date of birth. He cannot recall his full hospital stay. Follows commands. Moves all 4, 3+ prox to 4/5 distal  Skin: Skin is warm and dry.    Results for orders placed or performed during the hospital encounter of 08/13/14 (from the past 24 hour(s))  Glucose, capillary     Status: Abnormal   Collection Time: 08/22/14  4:07 PM  Result Value Ref Range   Glucose-Capillary 126 (H) 65 - 99 mg/dL   Comment 1 Notify RN   Glucose, capillary     Status: Abnormal   Collection  Time: 08/22/14  8:13 PM  Result Value Ref Range   Glucose-Capillary 144 (H) 65 - 99 mg/dL   Comment 1 Notify RN    Comment 2 Document in Chart   Glucose, capillary     Status: Abnormal   Collection Time: 08/23/14 12:25 AM  Result Value Ref Range   Glucose-Capillary 133 (H) 65 - 99 mg/dL   Comment 1 Notify RN    Comment 2 Document in Chart   Heparin level (unfractionated)     Status: Abnormal   Collection Time: 08/23/14  3:49 AM  Result Value Ref Range   Heparin Unfractionated 2.00 (H) 0.30 - 0.70 IU/mL  Protime-INR     Status: Abnormal   Collection Time: 08/23/14  3:49 AM  Result Value Ref Range   Prothrombin Time 22.8 (H) 11.6 - 15.2 seconds   INR 2.03 (H) 0.00 - 1.49  Glucose, capillary     Status: Abnormal   Collection Time: 08/23/14  6:47 AM  Result Value Ref Range   Glucose-Capillary 114 (H) 65 - 99 mg/dL  Glucose, capillary     Status: Abnormal   Collection Time: 08/23/14  8:09 AM  Result Value Ref Range   Glucose-Capillary 122 (H) 65 - 99 mg/dL  Glucose, capillary     Status: Abnormal   Collection Time: 08/23/14 12:16 PM  Result Value Ref Range   Glucose-Capillary 145 (H) 65 - 99 mg/dL   Comment 1 Notify RN    Ct Head Wo Contrast  08/23/2014   CLINICAL DATA:  Left arm and left leg weakness. Missed the last 2 dialysis treatments. In the morning of 08/13/2014 he developed acute onset shortness of breath and became unresponsive affiliated EMS began CPR. Total Downtime estimated at 20 minutes.  EXAM: CT HEAD WITHOUT CONTRAST  TECHNIQUE: Contiguous axial images were obtained from the base of the skull through the vertex without intravenous contrast.  COMPARISON:  08/13/2014  FINDINGS: Diffuse cerebral atrophy. Mild ventricular dilatation consistent with central atrophy. Low-attenuation changes in the deep white matter consistent with small vessel ischemia. No mass effect or midline shift. No abnormal extra-axial fluid collections. Gray-white matter junctions are distinct. Basal  cisterns are not effaced. No evidence of acute intracranial hemorrhage. No depressed skull fractures. Postoperative changes in the paranasal sinuses. Mucosal thickening in the maxillary antra. Mastoid air cells are not opacified. Ectatic and calcified intracranial internal carotid  arteries.  IMPRESSION: No acute intracranial abnormalities. Chronic atrophy and small vessel ischemic changes. Vascular atherosclerotic changes.   Electronically Signed   By: Lucienne Capers M.D.   On: 08/23/2014 01:26   Dg Chest Port 1 View  08/22/2014   CLINICAL DATA:  Adult respiratory distress syndrome. Recent cardiac arrest and respiratory failure. Recent extubation.  EXAM: PORTABLE CHEST - 1 VIEW  COMPARISON:  08/20/2014  FINDINGS: Endotracheal tube and nasogastric tube have been removed. Left subclavian central venous catheter remains in appropriate position. No pneumothorax visualized.  Decreased bibasilar atelectasis is seen compared to previous study. Cardiomegaly and diffuse pulmonary interstitial prominence remain stable. No evidence of pulmonary consolidation.  IMPRESSION: Decreased bibasilar atelectasis.  Stable cardiomegaly and diffuse pulmonary interstitial prominence.   Electronically Signed   By: Earle Gell M.D.   On: 08/22/2014 07:51    Assessment/Plan: Diagnosis: 59 yo morbidly obese male with debility after acute respiratory failure 1. Does the need for close, 24 hr/day medical supervision in concert with the patient's rehab needs make it unreasonable for this patient to be served in a less intensive setting? Yes 2. Co-Morbidities requiring supervision/potential complications: dm, esrd, chf 3. Due to bladder management, bowel management, safety, skin/wound care, disease management, medication administration, pain management and patient education, does the patient require 24 hr/day rehab nursing? Yes 4. Does the patient require coordinated care of a physician, rehab nurse, PT (1-2 hrs/day, 5 days/week) and  OT (1-2 hrs/day, 5 days/week) to address physical and functional deficits in the context of the above medical diagnosis(es)? Yes Addressing deficits in the following areas: balance, endurance, locomotion, strength, transferring, bowel/bladder control, bathing, dressing, feeding, grooming, toileting and psychosocial support 5. Can the patient actively participate in an intensive therapy program of at least 3 hrs of therapy per day at least 5 days per week? Yes 6. The potential for patient to make measurable gains while on inpatient rehab is excellent 7. Anticipated functional outcomes upon discharge from inpatient rehab are modified independent  with PT, modified independent with OT, n/a with SLP. 8. Estimated rehab length of stay to reach the above functional goals is: 7 days 9. Does the patient have adequate social supports and living environment to accommodate these discharge functional goals? Yes 10. Anticipated D/C setting: Home 11. Anticipated post D/C treatments: [facility] therapy 12. Overall Rehab/Functional Prognosis: excellent  RECOMMENDATIONS: This patient's condition is appropriate for continued rehabilitative care in the following setting: CIR Patient has agreed to participate in recommended program. Potentially Note that insurance prior authorization may be required for reimbursement for recommended care.  Comment: Rehab Admissions Coordinator to follow up.  Thanks,  Meredith Staggers, MD, Mellody Drown     08/23/2014

## 2014-08-23 NOTE — Progress Notes (Signed)
Pt. Requesting med for gas. On call NP, K. Schorr, made aware. New orders received.  Daniel Whitney, Daniel Whitney

## 2014-08-23 NOTE — Progress Notes (Signed)
PT and OT are recommending an inpt rehab consult, I will contact Dr. Coralyn Pear for an order. I will alert SW. [phone]

## 2014-08-23 NOTE — Progress Notes (Signed)
ANTICOAGULATION CONSULT NOTE - Follow Up Consult  Pharmacy Consult for Heparin (while INR <2) Indication: atrial fibrillation  No Known Allergies  Patient Measurements: Height: 5\' 9"  (175.3 cm) Weight: 231 lb 4.8 oz (104.917 kg) (bed scale) IBW/kg (Calculated) : 70.7  Vital Signs: Temp: 99.4 F (37.4 C) (07/11 0242) Temp Source: Oral (07/11 0242) BP: 116/64 mmHg (07/11 0242) Pulse Rate: 81 (07/11 0242)  Labs:  Recent Labs  08/21/14 0345 08/22/14 0400 08/22/14 0459 08/23/14 0349  HGB  --   --  10.8*  --   HCT  --   --  35.5*  --   PLT  --   --  224  --   LABPROT  --   --   --  22.8*  INR  --   --   --  2.03*  HEPARINUNFRC 0.28* 0.54  --  2.00*    Estimated Creatinine Clearance: 22.9 mL/min (by C-G formula based on Cr of 4.09).  Assessment: Pt has been on heparin while INR <2 and warfarin has been re-started. INR is within therapeutic range this AM at 2.03 (goal 2-3).   Goal of Therapy:  INR 2-3 Monitor platelets by anticoagulation protocol: Yes   Plan:  -DC heparin  -F/U need for heparin re-start if INR falls below 2  Narda Bonds 08/23/2014,5:39 AM

## 2014-08-23 NOTE — Progress Notes (Signed)
Pt. With limited ROM to right upper extremity. RRN on the floor and at pts. Bedside. Critical care MD,E.  Deterding, made aware. New orders received for pt. RN will continue to monitor pt. For changes in condition. Daniel Whitney, Katherine Roan

## 2014-08-23 NOTE — Care Management (Signed)
Important Message  Patient Details  Name: Daniel Whitney MRN: [id number] Date of Birth: 1955-02-28   Medicare Important Message Given:  Yes-fourth notification given    [name] May 08/23/2014, 2:35 PM

## 2014-08-23 NOTE — Evaluation (Signed)
Occupational Therapy Evaluation Patient Details Name: Daniel Whitney MRN: [id number] DOB: [birth year] Today's Date: 08/23/2014    History of Present Illness Pt is a 59 year old male with ESRD on HD who had missed his last 2 dialysis treatments. 7/01 early AM he developed acute onset SOB and then became unresponsive. EMS noted asystole and began CPR. Total downtime estimated at about 20 mins. In ED he had several runs of VT, but was otherwise was hemodynamically stable on ventilator. PCCM to admit. Pt intubated upon admission with periods of self exutbation and prompt reintubation. 7/9 exutabted by respiratory therapy. Most recent Chest x ray showed bibasilar atelectasis or PNA with pulmonary venous congestion.   Clinical Impression   Pt was performing mobility and self care at a modified independent level prior to admission.  Presents with generalized weakness, B UE shoulder weakness greater on R than L, impaired balance and decreased safety awareness.  Pt has potential to return to PLOF with intense rehab.  Will follow acutely.    Follow Up Recommendations  CIR    Equipment Recommendations       Recommendations for Other Services       Precautions / Restrictions Precautions Precautions: Fall Restrictions Weight Bearing Restrictions: No      Mobility Bed Mobility           Sit to supine: Min guard   General bed mobility comments: no physical assist  Transfers Overall transfer level: Needs assistance Equipment used: Rolling walker (2 wheeled) Transfers: Sit to/from Stand Sit to Stand: Min assist;Min guard         General transfer comment: braces LEs on chair for stability when standing    Balance     Sitting balance-Leahy Scale: Fair       Standing balance-Leahy Scale: Poor                              ADL Overall ADL's : Needs assistance/impaired Eating/Feeding: Independent;Sitting   Grooming: Wash/dry hands;Wash/dry face;Sitting;Set up    Upper Body Bathing: Minimal assitance;Sitting   Lower Body Bathing: Minimal assistance;Sit to/from stand   Upper Body Dressing : Minimal assistance;Sitting   Lower Body Dressing: Minimal assistance;Sit to/from stand Lower Body Dressing Details (indicate cue type and reason): able to donn and doff socks independently Toilet Transfer: Minimal assistance;Ambulation;RW;BSC   Toileting- Water quality scientist and Hygiene: Min guard;Sit to/from stand       Functional mobility during ADLs: Minimal assistance;Rolling walker (followed with chair)       Vision     Perception     Praxis      Pertinent Vitals/Pain Pain Assessment: No/denies pain     Hand Dominance Right   Extremity/Trunk Assessment Upper Extremity Assessment Upper Extremity Assessment: RUE deficits/detail;LUE deficits/detail RUE Deficits / Details: 2/5 shoulder, 4+/5 elbow to hand RUE Coordination:  (mild coordination deficit) LUE Deficits / Details: 3/5 strength, 4+/5 elbow to hand   Lower Extremity Assessment Lower Extremity Assessment: Defer to PT evaluation   Cervical / Trunk Assessment Cervical / Trunk Assessment: Normal   Communication Communication Communication: No difficulties   Cognition Arousal/Alertness: Awake/alert Behavior During Therapy: WFL for tasks assessed/performed Overall Cognitive Status: No family/caregiver present to determine baseline cognitive functioning Area of Impairment: Safety/judgement         Safety/Judgement: Decreased awareness of safety;Decreased awareness of deficits     General Comments: Pt with tangential conversation.   General Comments  Exercises       Shoulder Instructions      Home Living Family/patient expects to be discharged to:: Private residence Living Arrangements: Other relatives Available Help at Discharge: Family;Available 24 hours/day Type of Home: House Home Access: Stairs to enter [employer] of Steps: 6-8   Home  Layout: One level     Bathroom Shower/Tub: Tub/shower unit;Walk-in shower   Bathroom Toilet: Standard     Home Equipment: Environmental consultant - 2 wheels;Cane - single point;Shower seat (has grab bars, but has not installed)          Prior Functioning/Environment Level of Independence: Independent with assistive device(s)        Comments: States he was using both the walker and the cane to ambulate. Was able to bathe and dress and perform IADL without assist    OT Diagnosis: Generalized weakness;Cognitive deficits   OT Problem List: Decreased strength;Decreased activity tolerance;Impaired balance (sitting and/or standing);Decreased cognition;Decreased safety awareness;Decreased knowledge of use of DME or AE;Obesity;Impaired UE functional use   OT Treatment/Interventions: Self-care/ADL training;Therapeutic exercise;DME and/or AE instruction;Therapeutic activities;Patient/family education;Cognitive remediation/compensation;Balance training    OT Goals(Current goals can be found in the care plan section) Acute Rehab OT Goals Patient Stated Goal: Be able to walk again OT Goal Formulation: With patient Time For Goal Achievement: 09/06/14 Potential to Achieve Goals: Good ADL Goals Pt Will Perform Grooming: with modified independence;standing Pt Will Perform Lower Body Bathing: with modified independence;sit to/from stand Pt Will Perform Lower Body Dressing: with modified independence;sit to/from stand Pt Will Transfer to Toilet: with modified independence;ambulating;regular height toilet Pt Will Perform Toileting - Clothing Manipulation and hygiene: with modified independence;sit to/from stand Pt Will Perform Tub/Shower Transfer: Tub transfer;with modified independence;ambulating;shower seat;grab bars;rolling walker Pt/caregiver will Perform Home Exercise Program: Increased strength;Both right and left upper extremity;With written HEP provided (B shoulders)  OT Frequency: Min 2X/week   Barriers  to D/C:            Co-evaluation              End of Session Equipment Utilized During Treatment: Gait belt;Rolling walker Nurse Communication: Mobility status  Activity Tolerance: Patient tolerated treatment well Patient left: in bed;with call bell/phone within reach;with bed alarm set   Time: [id number] OT Time Calculation (min): 49 min Charges:  OT General Charges $OT Visit: 1 Procedure OT Evaluation $Initial OT Evaluation Tier I: 1 Procedure OT Treatments $Self Care/Home Management : 23-37 mins G-Codes:    Malka So 08/23/2014, 11:53 AM  [phone]

## 2014-08-23 NOTE — Progress Notes (Signed)
[facility] PROGRESS NOTE  Daniel Whitney [id number] DOB: 1955-10-15 DOA: 08/13/2014 PCP: Cathlean Cower, MD  Assessment/Plan: 1. Cardiac arrest -Patient with history of end-stage renal disease missing his last 2 dialysis sessions found unresponsive on the field, undergoing cardiopulmonary resuscitation. He had returned to spontaneous circulation undergoing cooling protocol with admission to the intensive care unit. -Transthoracic echocardiogram revealed an ejection fraction of 20% -He was successfully extubated on 08/21/2014, remains hemodynamically stable -His could have been secondary to ARDS or pulmonary edema, has by missing hemodialysis.  2.  End-stage renal disease. -Patient unfortunately having history noncompliance with hemodialysis. Dialysis schedule have him on Mondays Wednesdays and Fridays. Patient undergoing hemodialysis during this hospitalization. -Nephrology following.  3.  History of atrial fibrillation. -Presently rate controlled -Anticoagulation with warfarin -Pharmacy consulted for Coumadin dosing  4.  Anemia of chronic disease -Hemoglobin stable at 10.8 -Continue iron  5.  Dysphasia -Patient failing swallow eval -Will have pathology reassess  6.  Deconditioning -Patient having prolonged hospitalization, deconditioning. Has been accepted at inpatient rehabilitation  7.  History of CVA -Patient having residual right-sided hemiparesis, overnight felt hemiparesis may have worsened. - CT scan of the brain performed overnight did not show acute intracranial abnormalities.  Code Status: Full code Family Communication: Family not present Disposition Plan: Anticipate discharge to inpatient rehabilitation   Consultants:  Pulmonary medicine  Procedures: 7/01 CT head: NAICP 7/01 EEG: This sedated EEG is abnormal due to moderate diffuse slowing and suppression of the background 7/01 TTE: LVEF 20% 7/1 cardiac arrest asystole > to ICU. Arcitc sun  protocol 7/2- HD, rewarm 08/15/14: :peep to 5 successful  08/16/14: SEfl extubated and promptly reintubated 08/17/14: Self extubated yesterday while off dipriva and promptly reintubated. Following commands per RN. On low dose levophed. HD ongoing.  7/6- daily hd schedule changed 7./8/16: awake 08/21/2014: Doing SBT on low dose diprivan. HD ongoing -> EXTUBATED   HPI/Subjective: Patient is a pleasant 59 year old gentleman with a past medical history of end-stage renal disease on hemodialysis, chronic diastolic congestive heart failure, hypertension, atrial fibrillation, was admitted to the pulmonary critical care service on 08/13/2014, found unresponsive, pulseless, EMS providing cardiopulmonary resuscitation. Patient was resuscitated for approximately 20 minutes. He was intubated and admitted to the intensive care unit. Unfortunately patient had reported missing his last 2 sessions of hemodialysis. Initial workup included a CT scan of brain which did not show acute intracranial process. A transthoracic echocardiogram performed on 08/13/2014 revealed EF of 20% with akinetic mid to apical LV segments. Postarrest EKG showed atrial fibrillation. There were no ischemic changes seen. Patient underwent cooling protocol. He was extubated on 08/21/2014. Patient found to be doing well post extubation. Transferred to the floor on 08/22/2014. He was seen by physical therapy recommended CIR consultation.   Objective: Filed Vitals:   08/23/14 1500  BP: 129/66  Pulse: 80  Temp: 98.6 F (37 C)  Resp: 20    Intake/Output Summary (Last 24 hours) at 08/23/14 1729 Last data filed at 08/23/14 1500  Gross per 24 hour  Intake   1254 ml  Output      2 ml  Net   1252 ml   Filed Weights   08/22/14 0200 08/22/14 1000 08/23/14 0504  Weight: 104.1 kg (229 lb 8 oz) 103.4 kg (227 lb 15.3 oz) 104.917 kg (231 lb 4.8 oz)    Exam:   General:  Patient is awake and alert, oriented, no acute distress  Cardiovascular:  Regular rate and rhythm normal S1-S2 no murmurs rubs or  gallops  Respiratory: Normal respiratory effort, lungs are clear to auscultation bilaterally  Abdomen: Soft nontender nondistended  Musculoskeletal: No edema  Neurological: Patient having 3-5 muscle strength to his right upper extremity, 5 of 5 muscle strength to left upper extremity, 5/5 to bilateral extremities  Data Reviewed: Basic Metabolic Panel:  Recent Labs Lab 08/17/14 0335 08/18/14 0520 08/18/14 1735 08/19/14 0410 08/20/14 0450  NA 136 137  --  137 135  K 4.1 4.3  --  4.4 3.6  CL 97* 98*  --  99* 96*  CO2 25 28  --  29 31  GLUCOSE 119* 126*  --  148* 135*  BUN 90* 77*  --  57* 53*  CREATININE 8.56* 6.04*  --  4.50* 4.09*  CALCIUM 8.6* 8.7*  --  8.9 9.2  MG  --   --  1.9  --   --   PHOS 7.3* 5.4*  --   --  4.0   Liver Function Tests:  Recent Labs Lab 08/17/14 0335 08/18/14 0520 08/20/14 0450 08/21/14 0345  AST  --   --   --  18  ALT  --   --   --  15*  ALKPHOS  --   --   --  173*  BILITOT  --   --   --  0.8  PROT  --   --   --  6.1*  ALBUMIN 2.1* 2.1* 2.3* 2.3*   No results for input(s): LIPASE, AMYLASE in the last 168 hours. No results for input(s): AMMONIA in the last 168 hours. CBC:  Recent Labs Lab 08/17/14 0334 08/18/14 0520 08/20/14 0450 08/22/14 0459  WBC 13.4* 13.0* 11.4* 11.2*  NEUTROABS 10.8*  --  8.4*  --   HGB 9.2* 9.6* 10.0* 10.8*  HCT 27.7* 30.1* 32.3* 35.5*  MCV 90.5 92.9 94.7 97.8  PLT 181 191 199 224   Cardiac Enzymes: No results for input(s): CKTOTAL, CKMB, CKMBINDEX, TROPONINI in the last 168 hours. BNP (last 3 results) No results for input(s): BNP in the last 8760 hours.  ProBNP (last 3 results)  Recent Labs  01/29/14 0748  PROBNP 7824.0*    CBG:  Recent Labs Lab 08/23/14 0025 08/23/14 0647 08/23/14 0809 08/23/14 1216 08/23/14 1630  GLUCAP 133* 114* 122* 145* 113*    No results found for this or any previous visit (from the past 240 hour(s)).    Studies: Ct Head Wo Contrast  08/23/2014   CLINICAL DATA:  Left arm and left leg weakness. Missed the last 2 dialysis treatments. In the morning of 08/13/2014 he developed acute onset shortness of breath and became unresponsive affiliated EMS began CPR. Total Downtime estimated at 20 minutes.  EXAM: CT HEAD WITHOUT CONTRAST  TECHNIQUE: Contiguous axial images were obtained from the base of the skull through the vertex without intravenous contrast.  COMPARISON:  08/13/2014  FINDINGS: Diffuse cerebral atrophy. Mild ventricular dilatation consistent with central atrophy. Low-attenuation changes in the deep white matter consistent with small vessel ischemia. No mass effect or midline shift. No abnormal extra-axial fluid collections. Gray-white matter junctions are distinct. Basal cisterns are not effaced. No evidence of acute intracranial hemorrhage. No depressed skull fractures. Postoperative changes in the paranasal sinuses. Mucosal thickening in the maxillary antra. Mastoid air cells are not opacified. Ectatic and calcified intracranial internal carotid arteries.  IMPRESSION: No acute intracranial abnormalities. Chronic atrophy and small vessel ischemic changes. Vascular atherosclerotic changes.   Electronically Signed   By: Oren Beckmann.D.  On: 08/23/2014 01:26   Dg Chest Port 1 View  08/22/2014   CLINICAL DATA:  Adult respiratory distress syndrome. Recent cardiac arrest and respiratory failure. Recent extubation.  EXAM: PORTABLE CHEST - 1 VIEW  COMPARISON:  08/20/2014  FINDINGS: Endotracheal tube and nasogastric tube have been removed. Left subclavian central venous catheter remains in appropriate position. No pneumothorax visualized.  Decreased bibasilar atelectasis is seen compared to previous study. Cardiomegaly and diffuse pulmonary interstitial prominence remain stable. No evidence of pulmonary consolidation.  IMPRESSION: Decreased bibasilar atelectasis.  Stable cardiomegaly and diffuse  pulmonary interstitial prominence.   Electronically Signed   By: Earle Gell M.D.   On: 08/22/2014 07:51    Scheduled Meds: . sodium chloride   Intravenous Once  . antiseptic oral rinse  7 mL Mouth Rinse q12n4p  . darbepoetin (ARANESP) injection - DIALYSIS  100 mcg Intravenous Q Tue-HD  . ferric gluconate (FERRLECIT/NULECIT) IV  62.5 mg Intravenous Q Thu-HD  . insulin aspart  0-9 Units Subcutaneous 6 times per day  . pantoprazole (PROTONIX) IV  40 mg Intravenous QHS  . sodium chloride  10-40 mL Intracatheter Q12H  . Warfarin - Pharmacist Dosing Inpatient   Does not apply q1800   Continuous Infusions:   Active Problems:   Asystole   Cardiopulmonary arrest   ARDS (adult respiratory distress syndrome)   Encounter for central line placement   Cardiac arrest   Respiratory failure    Time spent: 25 min    Kelvin Cellar  [facility] Pager [phone]. If 7PM-7AM, please contact night-coverage at [email or web address], password [facility] 08/23/2014, 5:29 PM  LOS: 10 days

## 2014-08-23 NOTE — Progress Notes (Signed)
Rehab Admissions Coordinator Note:  Patient was screened by Cleatrice Burke for appropriateness for an Inpatient Acute Rehab Consult per PT recommendation.  At this time, we are recommending await progress with therpay today and OT eval to assist with rehab needs assessment.Cleatrice Burke 08/23/2014, 8:02 AM  I can be reached at [phone].

## 2014-08-23 NOTE — Progress Notes (Signed)
  [facility] Progress Note   Subjective: can't move R arm normally, "no power". Old R leg weakness, no swallowing difficulties.   Filed Vitals:   08/22/14 1424 08/22/14 2016 08/23/14 0242 08/23/14 0504  BP: 123/62 108/71 116/64   Pulse: 81 80 81   Temp: 98.6 F (37 C) 99.6 F (37.6 C) 99.4 F (37.4 C)   TempSrc: Oral Oral Oral   Resp: 20 22 20    Height:      Weight:    104.917 kg (231 lb 4.8 oz)  SpO2: 94% 93% 91%    Exam: Alert, no distress, lying flat No jvd Chest clear bilat RRR no RG Abd soft , obese, ntnd No LE or UE edema L arm AVF patent Neuro R arm grip 4/5, cannot abduct at the shoulder very well, sensation intact  TTS [place] 4h  EDW 119kg will have lower EDW at discharge  LFA AVF Heparin 3600 450/800 Hect 4 , Venofer 50/wk, mircera 75 q2wks last 6/21       Assessment: 1. Asystole/ cardiac arrest/ severe CM EF 20%- s/p cooling protocol. Per primary 2. Pulm edema severe/ vol excess- resolved, down 16 kg from admission 3. R arm weakness - unclear cause, rotator cuff?  Head CT on 7/11 w/o cva 4. ESRD on HD 5. 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 DM 2 8. Afib on coumadin, off heparin, INR 2.0 9. HTN not on any meds here, (coreg/ valsartan at home ), BP stable  Plan - HD tomorrow    Kelly Splinter MD  pager [phone]    cell [phone]  08/23/2014, 9:31 AM     Recent Labs Lab 08/17/14 0335 08/18/14 0520 08/19/14 0410 08/20/14 0450  NA 136 137 137 135  K 4.1 4.3 4.4 3.6  CL 97* 98* 99* 96*  CO2 25 28 29 31   GLUCOSE 119* 126* 148* 135*  BUN 90* 77* 57* 53*  CREATININE 8.56* 6.04* 4.50* 4.09*  CALCIUM 8.6* 8.7* 8.9 9.2  PHOS 7.3* 5.4*  --  4.0    Recent Labs Lab 08/18/14 0520 08/20/14 0450 08/21/14 0345  AST  --   --  18  ALT  --   --  15*  ALKPHOS  --   --  173*  BILITOT  --   --  0.8  PROT  --   --  6.1*  ALBUMIN 2.1* 2.3* 2.3*    Recent Labs Lab 08/17/14 0334  08/18/14 0520 08/20/14 0450 08/22/14 0459  WBC 13.4* 13.0* 11.4* 11.2*  NEUTROABS 10.8*  --  8.4*  --   HGB 9.2* 9.6* 10.0* 10.8*  HCT 27.7* 30.1* 32.3* 35.5*  MCV 90.5 92.9 94.7 97.8  PLT 181 191 199 224   . sodium chloride   Intravenous Once  . antiseptic oral rinse  7 mL Mouth Rinse q12n4p  . darbepoetin (ARANESP) injection - DIALYSIS  100 mcg Intravenous Q Tue-HD  . ferric gluconate (FERRLECIT/NULECIT) IV  62.5 mg Intravenous Q Thu-HD  . insulin aspart  0-9 Units Subcutaneous 6 times per day  . pantoprazole (PROTONIX) IV  40 mg Intravenous QHS  . sodium chloride  10-40 mL Intracatheter Q12H  . Warfarin - Pharmacist Dosing Inpatient   Does not apply q1800     heparin, heparin, sodium chloride

## 2014-08-23 NOTE — Progress Notes (Signed)
eLink Physician-Brief Progress Note Patient Name: Daniel Whitney DOB: 1955-05-03 MRN: [id number]   Date of Service  08/23/2014  HPI/Events of Note  Call from bedside nurse who reports that patient now presenting with left-sided weakness of arm and leg.  She states the patient moved to the unit at 2pm and there is not history of stroke type symptoms.  Otherwise patient is stable.  eICU Interventions  Plan: Check un-contrasted head CT Continue to monitor patient     Intervention Category Major Interventions: Other:  DETERDING,ELIZABETH 08/23/2014, 12:08 AM

## 2014-08-24 ENCOUNTER — Inpatient Hospital Stay (HOSPITAL_COMMUNITY): Payer: Medicare Other

## 2014-08-24 DIAGNOSIS — A419 Sepsis, unspecified organism: Secondary | ICD-10-CM

## 2014-08-24 DIAGNOSIS — I5020 Unspecified systolic (congestive) heart failure: Secondary | ICD-10-CM

## 2014-08-24 DIAGNOSIS — D72829 Elevated white blood cell count, unspecified: Secondary | ICD-10-CM

## 2014-08-24 DIAGNOSIS — G9340 Encephalopathy, unspecified: Secondary | ICD-10-CM

## 2014-08-24 DIAGNOSIS — N186 End stage renal disease: Secondary | ICD-10-CM

## 2014-08-24 DIAGNOSIS — R0902 Hypoxemia: Secondary | ICD-10-CM

## 2014-08-24 DIAGNOSIS — Z992 Dependence on renal dialysis: Secondary | ICD-10-CM

## 2014-08-24 LAB — CBC WITH DIFFERENTIAL/PLATELET
Basophils Absolute: 0 10*3/uL (ref 0.0–0.1)
Basophils Relative: 0 % (ref 0–1)
EOS PCT: 0 % (ref 0–5)
Eosinophils Absolute: 0 10*3/uL (ref 0.0–0.7)
HCT: 33.5 % — ABNORMAL LOW (ref 39.0–52.0)
Hemoglobin: 10.9 g/dL — ABNORMAL LOW (ref 13.0–17.0)
LYMPHS PCT: 6 % — AB (ref 12–46)
Lymphs Abs: 1.6 10*3/uL (ref 0.7–4.0)
MCH: 29.9 pg (ref 26.0–34.0)
MCHC: 32.5 g/dL (ref 30.0–36.0)
MCV: 91.8 fL (ref 78.0–100.0)
Monocytes Absolute: 1.1 10*3/uL — ABNORMAL HIGH (ref 0.1–1.0)
Monocytes Relative: 4 % (ref 3–12)
NEUTROS ABS: 23.7 10*3/uL — AB (ref 1.7–7.7)
Neutrophils Relative %: 90 % — ABNORMAL HIGH (ref 43–77)
PLATELETS: 277 10*3/uL (ref 150–400)
RBC: 3.65 MIL/uL — ABNORMAL LOW (ref 4.22–5.81)
RDW: 15.7 % — AB (ref 11.5–15.5)
WBC: 26.4 10*3/uL — ABNORMAL HIGH (ref 4.0–10.5)

## 2014-08-24 LAB — COMPREHENSIVE METABOLIC PANEL
ALT: 22 U/L (ref 17–63)
AST: 33 U/L (ref 15–41)
Albumin: 2.4 g/dL — ABNORMAL LOW (ref 3.5–5.0)
Alkaline Phosphatase: 306 U/L — ABNORMAL HIGH (ref 38–126)
Anion gap: 13 (ref 5–15)
BUN: 32 mg/dL — ABNORMAL HIGH (ref 6–20)
CO2: 24 mmol/L (ref 22–32)
CREATININE: 5.12 mg/dL — AB (ref 0.61–1.24)
Calcium: 8.6 mg/dL — ABNORMAL LOW (ref 8.9–10.3)
Chloride: 98 mmol/L — ABNORMAL LOW (ref 101–111)
GFR calc non Af Amer: 11 mL/min — ABNORMAL LOW (ref >60)
GFR, EST AFRICAN AMERICAN: 13 mL/min — AB (ref >60)
Glucose, Bld: 140 mg/dL — ABNORMAL HIGH (ref 65–99)
Potassium: 4.1 mmol/L (ref 3.5–5.1)
SODIUM: 135 mmol/L (ref 135–145)
Total Bilirubin: 1.6 mg/dL — ABNORMAL HIGH (ref 0.3–1.2)
Total Protein: 6.3 g/dL — ABNORMAL LOW (ref 6.5–8.1)

## 2014-08-24 LAB — GLUCOSE, CAPILLARY
Glucose-Capillary: 131 mg/dL — ABNORMAL HIGH (ref 65–99)
Glucose-Capillary: 90 mg/dL (ref 65–99)
Glucose-Capillary: 79 mg/dL (ref 65–99)
Glucose-Capillary: 131 mg/dL — ABNORMAL HIGH (ref 65–99)
Glucose-Capillary: 183 mg/dL — ABNORMAL HIGH (ref 65–99)

## 2014-08-24 LAB — PROTIME-INR
INR: 1.56 — ABNORMAL HIGH (ref 0.00–1.49)
INR: 1.84 — ABNORMAL HIGH (ref 0.00–1.49)
PROTHROMBIN TIME: 21.2 s — AB (ref 11.6–15.2)
Prothrombin Time: 18.7 seconds — ABNORMAL HIGH (ref 11.6–15.2)

## 2014-08-24 LAB — RENAL FUNCTION PANEL
Albumin: 2.6 g/dL — ABNORMAL LOW (ref 3.5–5.0)
Anion gap: 14 (ref 5–15)
BUN: 83 mg/dL — ABNORMAL HIGH (ref 6–20)
CO2: 23 mmol/L (ref 22–32)
Calcium: 10 mg/dL (ref 8.9–10.3)
Chloride: 94 mmol/L — ABNORMAL LOW (ref 101–111)
Creatinine, Ser: 8.59 mg/dL — ABNORMAL HIGH (ref 0.61–1.24)
GFR calc Af Amer: 7 mL/min — ABNORMAL LOW (ref >60)
GFR calc non Af Amer: 6 mL/min — ABNORMAL LOW (ref >60)
Glucose, Bld: 153 mg/dL — ABNORMAL HIGH (ref 65–99)
Phosphorus: 5 mg/dL — ABNORMAL HIGH (ref 2.5–4.6)
Potassium: 4.4 mmol/L (ref 3.5–5.1)
SODIUM: 131 mmol/L — AB (ref 135–145)

## 2014-08-24 LAB — MAGNESIUM: Magnesium: 1.5 mg/dL — ABNORMAL LOW (ref 1.7–2.4)

## 2014-08-24 LAB — CBC
HCT: 35.1 % — ABNORMAL LOW (ref 39.0–52.0)
Hemoglobin: 11.1 g/dL — ABNORMAL LOW (ref 13.0–17.0)
MCH: 30 pg (ref 26.0–34.0)
MCHC: 31.6 g/dL (ref 30.0–36.0)
MCV: 94.9 fL (ref 78.0–100.0)
PLATELETS: 240 10*3/uL (ref 150–400)
RBC: 3.7 MIL/uL — ABNORMAL LOW (ref 4.22–5.81)
RDW: 16 % — ABNORMAL HIGH (ref 11.5–15.5)
WBC: 13.1 10*3/uL — ABNORMAL HIGH (ref 4.0–10.5)

## 2014-08-24 LAB — BASIC METABOLIC PANEL
Anion gap: 15 (ref 5–15)
BUN: 30 mg/dL — AB (ref 6–20)
CHLORIDE: 97 mmol/L — AB (ref 101–111)
CO2: 23 mmol/L (ref 22–32)
Calcium: 8.7 mg/dL — ABNORMAL LOW (ref 8.9–10.3)
Creatinine, Ser: 4.87 mg/dL — ABNORMAL HIGH (ref 0.61–1.24)
GFR, EST AFRICAN AMERICAN: 14 mL/min — AB (ref >60)
GFR, EST NON AFRICAN AMERICAN: 12 mL/min — AB (ref >60)
Glucose, Bld: 149 mg/dL — ABNORMAL HIGH (ref 65–99)
POTASSIUM: 4.7 mmol/L (ref 3.5–5.1)
SODIUM: 135 mmol/L (ref 135–145)

## 2014-08-24 LAB — POCT I-STAT 3, VENOUS BLOOD GAS (G3P V)
ACID-BASE DEFICIT: 1 mmol/L (ref 0.0–2.0)
Bicarbonate: 25.2 mEq/L — ABNORMAL HIGH (ref 20.0–24.0)
O2 Saturation: 83 %
PO2 VEN: 51 mmHg — AB (ref 30.0–45.0)
TCO2: 27 mmol/L (ref 0–100)
pCO2, Ven: 47.4 mmHg (ref 45.0–50.0)
pH, Ven: 7.333 — ABNORMAL HIGH (ref 7.250–7.300)

## 2014-08-24 LAB — PROCALCITONIN: PROCALCITONIN: 15.6 ng/mL

## 2014-08-24 LAB — APTT: aPTT: 28 seconds (ref 24–37)

## 2014-08-24 LAB — PHOSPHORUS: PHOSPHORUS: 3.1 mg/dL (ref 2.5–4.6)

## 2014-08-24 LAB — TROPONIN I: TROPONIN I: 0.07 ng/mL — AB (ref <0.031)

## 2014-08-24 LAB — LACTIC ACID, PLASMA
Lactic Acid, Venous: 2.2 mmol/L (ref 0.5–2.0)
Lactic Acid, Venous: 1.3 mmol/L (ref 0.5–2.0)

## 2014-08-24 MED ORDER — MIDAZOLAM HCL 2 MG/2ML IJ SOLN
2.0000 mg | Freq: Once | INTRAMUSCULAR | Status: AC
  Administered 2014-08-24: 2 mg via INTRAVENOUS

## 2014-08-24 MED ORDER — PENTAFLUOROPROP-TETRAFLUOROETH EX AERO: 1.0000 "application " | INHALATION_SPRAY | CUTANEOUS | Status: DC | PRN

## 2014-08-24 MED ORDER — METRONIDAZOLE IN NACL 5-0.79 MG/ML-% IV SOLN
500.0000 mg | Freq: Four times a day (QID) | INTRAVENOUS | Status: DC
  Administered 2014-08-24 – 2014-08-25 (×4): 500 mg via INTRAVENOUS
  Filled 2014-08-24 (×7): qty 100

## 2014-08-24 MED ORDER — SEVELAMER CARBONATE 800 MG PO TABS
800.0000 mg | ORAL_TABLET | Freq: Three times a day (TID) | ORAL | Status: DC
  Administered 2014-08-27 – 2014-08-31 (×9): 800 mg via ORAL
  Filled 2014-08-24 (×26): qty 1

## 2014-08-24 MED ORDER — DILTIAZEM HCL 25 MG/5ML IV SOLN
20.0000 mg | Freq: Once | INTRAVENOUS | Status: DC
  Filled 2014-08-24: qty 5

## 2014-08-24 MED ORDER — AMIODARONE HCL IN DEXTROSE 360-4.14 MG/200ML-% IV SOLN
INTRAVENOUS | Status: AC
  Filled 2014-08-24: qty 200

## 2014-08-24 MED ORDER — VANCOMYCIN HCL IN DEXTROSE 1-5 GM/200ML-% IV SOLN
1000.0000 mg | INTRAVENOUS | Status: DC
  Administered 2014-08-26: 1000 mg via INTRAVENOUS
  Filled 2014-08-24 (×3): qty 200

## 2014-08-24 MED ORDER — FENTANYL CITRATE (PF) 100 MCG/2ML IJ SOLN
INTRAMUSCULAR | Status: AC
  Administered 2014-08-24: 100 ug via INTRAVENOUS
  Filled 2014-08-24: qty 4

## 2014-08-24 MED ORDER — CHLORHEXIDINE GLUCONATE 0.12 % MT SOLN
15.0000 mL | Freq: Two times a day (BID) | OROMUCOSAL | Status: DC
  Administered 2014-08-24 – 2014-08-27 (×6): 15 mL via OROMUCOSAL
  Filled 2014-08-24 (×6): qty 15

## 2014-08-24 MED ORDER — SODIUM CHLORIDE 0.9 % IV SOLN: 100.0000 mL | INTRAVENOUS | Status: DC | PRN

## 2014-08-24 MED ORDER — WARFARIN SODIUM 6 MG PO TABS
6.0000 mg | ORAL_TABLET | Freq: Once | ORAL | Status: DC
  Filled 2014-08-24: qty 1

## 2014-08-24 MED ORDER — AMIODARONE HCL IN DEXTROSE 360-4.14 MG/200ML-% IV SOLN
INTRAVENOUS | Status: AC
  Administered 2014-08-24: 200 mL
  Filled 2014-08-24: qty 200

## 2014-08-24 MED ORDER — DEXTROSE 5 % IV SOLN
2.0000 g | INTRAVENOUS | Status: DC
  Administered 2014-08-26: 2 g via INTRAVENOUS
  Filled 2014-08-24 (×2): qty 2

## 2014-08-24 MED ORDER — VANCOMYCIN 50 MG/ML ORAL SOLUTION
500.0000 mg | Freq: Four times a day (QID) | ORAL | Status: DC
  Administered 2014-08-24 – 2014-08-25 (×4): 500 mg via ORAL
  Filled 2014-08-24 (×7): qty 10

## 2014-08-24 MED ORDER — HEPARIN (PORCINE) IN NACL 100-0.45 UNIT/ML-% IJ SOLN
1850.0000 [IU]/h | INTRAMUSCULAR | Status: DC
  Filled 2014-08-24 (×2): qty 250

## 2014-08-24 MED ORDER — ETOMIDATE 2 MG/ML IV SOLN
20.0000 mg | Freq: Once | INTRAVENOUS | Status: AC
Start: 2014-08-24 — End: 2014-08-24
  Administered 2014-08-24: 20 mg via INTRAVENOUS

## 2014-08-24 MED ORDER — MIDAZOLAM HCL 2 MG/2ML IJ SOLN
2.0000 mg | Freq: Once | INTRAMUSCULAR | Status: AC
Start: 2014-08-24 — End: 2014-08-24
  Administered 2014-08-24: 2 mg via INTRAVENOUS

## 2014-08-24 MED ORDER — HEPARIN SODIUM (PORCINE) 5000 UNIT/ML IJ SOLN
5000.0000 [IU] | Freq: Three times a day (TID) | INTRAMUSCULAR | Status: DC
  Administered 2014-08-24 – 2014-08-25 (×3): 5000 [IU] via SUBCUTANEOUS
  Filled 2014-08-24 (×5): qty 1

## 2014-08-24 MED ORDER — ACETAMINOPHEN 500 MG PO TABS: 1000.0000 mg | ORAL_TABLET | Freq: Four times a day (QID) | ORAL | Status: DC | PRN

## 2014-08-24 MED ORDER — SODIUM CHLORIDE 0.9 % IV SOLN
25.0000 ug/h | INTRAVENOUS | Status: DC
  Administered 2014-08-24 (×2): 100 ug/h via INTRAVENOUS
  Filled 2014-08-24 (×2): qty 50

## 2014-08-24 MED ORDER — CETYLPYRIDINIUM CHLORIDE 0.05 % MT LIQD
7.0000 mL | Freq: Four times a day (QID) | OROMUCOSAL | Status: DC
  Administered 2014-08-25 – 2014-08-27 (×12): 7 mL via OROMUCOSAL

## 2014-08-24 MED ORDER — ETOMIDATE 2 MG/ML IV SOLN
20.0000 mg | Freq: Once | INTRAVENOUS | Status: AC
  Administered 2014-08-24: 20 mg via INTRAVENOUS

## 2014-08-24 MED ORDER — FENTANYL CITRATE (PF) 100 MCG/2ML IJ SOLN
100.0000 ug | Freq: Once | INTRAMUSCULAR | Status: AC
  Administered 2014-08-24: 100 ug via INTRAVENOUS

## 2014-08-24 MED ORDER — HEPARIN SODIUM (PORCINE) 1000 UNIT/ML DIALYSIS: 2000.0000 [IU] | INTRAMUSCULAR | Status: DC | PRN

## 2014-08-24 MED ORDER — ACETAMINOPHEN 650 MG RE SUPP
650.0000 mg | Freq: Four times a day (QID) | RECTAL | Status: DC | PRN
  Administered 2014-08-24: 650 mg via RECTAL

## 2014-08-24 MED ORDER — SODIUM CHLORIDE 0.9 % IV SOLN
INTRAVENOUS | Status: DC
  Administered 2014-08-25: 125 mL/h via INTRAVENOUS
  Administered 2014-08-25: 12:00:00 via INTRAVENOUS

## 2014-08-24 MED ORDER — SODIUM CHLORIDE 0.9 % IV BOLUS (SEPSIS)
1000.0000 mL | INTRAVENOUS | Status: AC
Start: 2014-08-24 — End: 2014-08-24
  Administered 2014-08-24: 1000 mL via INTRAVENOUS

## 2014-08-24 MED ORDER — HEPARIN SODIUM (PORCINE) 1000 UNIT/ML DIALYSIS: 1000.0000 [IU] | INTRAMUSCULAR | Status: DC | PRN

## 2014-08-24 MED ORDER — MIDAZOLAM HCL 2 MG/2ML IJ SOLN
INTRAMUSCULAR | Status: AC
  Administered 2014-08-24: 2 mg via INTRAVENOUS
  Filled 2014-08-24: qty 4

## 2014-08-24 MED ORDER — MAGNESIUM SULFATE IN D5W 10-5 MG/ML-% IV SOLN
1.0000 g | Freq: Once | INTRAVENOUS | Status: AC
  Administered 2014-08-24: 1 g via INTRAVENOUS
  Filled 2014-08-24: qty 100

## 2014-08-24 MED ORDER — NEPRO/CARBSTEADY PO LIQD: 237.0000 mL | ORAL | Status: DC | PRN

## 2014-08-24 MED ORDER — FENTANYL CITRATE (PF) 100 MCG/2ML IJ SOLN: 50.0000 ug | Freq: Once | INTRAMUSCULAR | Status: DC

## 2014-08-24 MED ORDER — DARBEPOETIN ALFA 100 MCG/0.5ML IJ SOSY
PREFILLED_SYRINGE | INTRAMUSCULAR | Status: AC
  Filled 2014-08-24: qty 0.5

## 2014-08-24 MED ORDER — RENA-VITE PO TABS
1.0000 | ORAL_TABLET | Freq: Every day | ORAL | Status: DC
  Administered 2014-08-24 – 2014-08-31 (×8): 1 via ORAL
  Filled 2014-08-24 (×11): qty 1

## 2014-08-24 MED ORDER — FENTANYL BOLUS VIA INFUSION
25.0000 ug | INTRAVENOUS | Status: DC | PRN
  Filled 2014-08-24: qty 25

## 2014-08-24 MED ORDER — NOREPINEPHRINE BITARTRATE 1 MG/ML IV SOLN
5.0000 ug/min | INTRAVENOUS | Status: DC
  Filled 2014-08-24 (×2): qty 4

## 2014-08-24 MED ORDER — LIDOCAINE HCL (PF) 1 % IJ SOLN: 5.0000 mL | INTRAMUSCULAR | Status: DC | PRN

## 2014-08-24 MED ORDER — CEFTAZIDIME 2 G IJ SOLR
2.0000 g | Freq: Once | INTRAMUSCULAR | Status: AC
  Administered 2014-08-24: 2 g via INTRAVENOUS
  Filled 2014-08-24: qty 2

## 2014-08-24 MED ORDER — PHENYLEPHRINE HCL 10 MG/ML IJ SOLN
0.0000 ug/min | INTRAVENOUS | Status: DC
  Administered 2014-08-24 – 2014-08-25 (×3): 200 ug/min via INTRAVENOUS
  Administered 2014-08-25: 150 ug/min via INTRAVENOUS
  Administered 2014-08-25: 200 ug/min via INTRAVENOUS
  Administered 2014-08-26: 30 ug/min via INTRAVENOUS
  Filled 2014-08-24 (×7): qty 4

## 2014-08-24 MED ORDER — METOPROLOL TARTRATE 1 MG/ML IV SOLN
INTRAVENOUS | Status: AC
  Administered 2014-08-24: 5 mg
  Filled 2014-08-24: qty 5

## 2014-08-24 MED ORDER — LIDOCAINE-PRILOCAINE 2.5-2.5 % EX CREA: 1.0000 "application " | TOPICAL_CREAM | CUTANEOUS | Status: DC | PRN

## 2014-08-24 MED ORDER — PHENYLEPHRINE HCL 10 MG/ML IJ SOLN
0.0000 ug/min | INTRAVENOUS | Status: DC
  Administered 2014-08-24: 200 ug/min via INTRAVENOUS
  Filled 2014-08-24 (×2): qty 1

## 2014-08-24 MED ORDER — FENTANYL CITRATE (PF) 100 MCG/2ML IJ SOLN
INTRAMUSCULAR | Status: AC
  Administered 2014-08-24: 100 ug via INTRAVENOUS
  Filled 2014-08-24: qty 2

## 2014-08-24 MED ORDER — FENTANYL CITRATE (PF) 100 MCG/2ML IJ SOLN
100.0000 ug | Freq: Once | INTRAMUSCULAR | Status: AC
Start: 2014-08-24 — End: 2014-08-24
  Administered 2014-08-24: 100 ug via INTRAVENOUS

## 2014-08-24 MED ORDER — VANCOMYCIN HCL 10 G IV SOLR
2000.0000 mg | Freq: Once | INTRAVENOUS | Status: AC
  Administered 2014-08-24: 2000 mg via INTRAVENOUS
  Filled 2014-08-24: qty 2000

## 2014-08-24 MED ORDER — MIDAZOLAM HCL 2 MG/2ML IJ SOLN
INTRAMUSCULAR | Status: AC
  Administered 2014-08-24: 2 mg via INTRAVENOUS
  Filled 2014-08-24: qty 2

## 2014-08-24 MED ORDER — ACETAMINOPHEN 160 MG/5ML PO SOLN
1000.0000 mg | Freq: Four times a day (QID) | ORAL | Status: DC | PRN
  Administered 2014-08-24: 1000 mg via ORAL
  Filled 2014-08-24: qty 40.6

## 2014-08-24 MED ORDER — ALTEPLASE 2 MG IJ SOLR
2.0000 mg | Freq: Once | INTRAMUSCULAR | Status: DC | PRN
  Filled 2014-08-24: qty 2

## 2014-08-24 MED ORDER — SODIUM CHLORIDE 0.9 % IV BOLUS (SEPSIS)
500.0000 mL | INTRAVENOUS | Status: AC
  Administered 2014-08-24: 500 mL via INTRAVENOUS

## 2014-08-24 NOTE — Significant Event (Addendum)
Rapid Response Event Note  Overview: Time Called: [id number] Arrival Time: [id number] Event Type: Respiratory, Other (Comment) (sepsis)  Initial Focused Assessment: Patient in respiratory distress, paradoxical breathing, occasionally making sounds ST 130-160  RR 30-40s  Unable to obtain sats. Patient with rigors.  Temp 104  BP 140/80 Dr Coralyn Pear at bedside, consulted CCM  Interventions: NS bolus initiated. Patient placed on NRB, prepared to transport. RN called report to 35M Patient transported to 35M via bed with O2 and heart monitor. Staff at bedside to receive patient  Event Summary: Name of Physician Notified: Dr Coralyn Pear at bedside at    Name of Consulting Physician Notified: PCCM, Dr Milinda Hirschfeld at 1515  Outcome: Transferred (Comment) [phone])  Event End Time: [place]  Raliegh Ip

## 2014-08-24 NOTE — Progress Notes (Signed)
Report called to Angela Nevin, RN on 43M.  Roselyn Reef Celestine Prim,RN

## 2014-08-24 NOTE — Progress Notes (Signed)
[facility] Progress Note  Assessment/Plan: 1. Asystole/ cardiac arrest/ severe CM EF 20%- s/p cooling protocol. Per primary 2. Pulm edema severe/ vol excess- resolved, down 16 kg from admission 3. R arm weakness - unclear cause, rotator cuff? Head CT on 7/11 w/o cva 4. ESRD TTS - tolerating HD well; new EDW at d/c 5. Anemia cont darbe 100/wk,+ weekly Fe on Tuesday Hgb 10.9 6. 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 DM 2 8. Afib on coumadin, off heparin, INR 1.56 9. HTN not on any meds here, (coreg/ valsartan at home ), BP stable with decreased volume 10. AMS - recognizes me but intermittently confused per nursing and Dr. Marval Regal who spoke to him when he walked through the HD unit. Suspect confusion is related to fever and leukocytosis 11. Acute leukocytosis  WBC ^ from 11 K to 26 K  Tmax 99.1 up to 100 near the end of HD with chills - checking BC x 2-to be drawn on HD today; most recent CXR 7/10 - no mention of infiltrate - sats ok, coughing some; check UA due to dysuria; does not look acutely ill; cannot tell if intermittent confusion is new. Has central line that could be removed; has peripheral IV. Dr. [name] to talk with primary 12.  Nutrition - add vits 13. Disp - plan to transfer to rehab; needs standing weights for more accuracy when stronger  Myriam Jacobson, PA-C [place] [phone] 08/24/2014,11:23 AM  LOS: 11 days   Pt seen, examined and agree w A/P as above. WBC up today and chills on dialysis, reporting wet cough and dysuria. CXR/ UA pending, empiric abx ordered w vanc/ fortaz. Consider line sepsis as well, have d/w primary. Stable from renal standpoint.  Kelly Splinter MD pager [phone]    cell [phone] 08/24/2014, 12:35 PM    Subjective:   Right sided chest pain when he coughs, no diarrhea, some dysuria; feeling cold on dialysis  Objective Filed Vitals:   08/24/14 0830 08/24/14 0900 08/24/14 0930  08/24/14 1000  BP: 134/66 138/65 108/50 126/70  Pulse: 78 86 85 82  Temp:      TempSrc:      Resp:      Height:      Weight:      SpO2:       Physical Exam on HD goal 2.5 L General: Heart: Lungs: Abdomen: Extremities: Dialysis Access: left AVF  Dialysis Orders:  TTS [place] 4h  EDW 119kg will have lower EDW at discharge  LFA AVF Heparin 3600 450/800 Hect 4 , Venofer 50/wk, mircera 75 q2wks last 6/21  Additional Objective Labs: Lab Results  Component Value Date   INR 1.56* 08/24/2014   INR 2.03* 08/23/2014   INR 2.07* [id number]    Basic Metabolic Panel:  Recent Labs Lab 08/18/14 0520 08/19/14 0410 08/20/14 0450 08/24/14 0809  NA 137 137 135 131*  K 4.3 4.4 3.6 4.4  CL 98* 99* 96* 94*  CO2 28 29 31 23   GLUCOSE 126* 148* 135* 153*  BUN 77* 57* 53* 83*  CREATININE 6.04* 4.50* 4.09* 8.59*  CALCIUM 8.7* 8.9 9.2 10.0  PHOS 5.4*  --  4.0 5.0*   Liver Function Tests:  Recent Labs Lab 08/20/14 0450 08/21/14 0345 08/24/14 0809  AST  --  18  --   ALT  --  15*  --   ALKPHOS  --  173*  --   BILITOT  --  0.8  --  PROT  --  6.1*  --   ALBUMIN 2.3* 2.3* 2.6*   CBC:  Recent Labs Lab 08/18/14 0520 08/20/14 0450 08/22/14 0459 08/24/14 0809  WBC 13.0* 11.4* 11.2* 26.4*  NEUTROABS  --  8.4*  --  23.7*  HGB 9.6* 10.0* 10.8* 10.9*  HCT 30.1* 32.3* 35.5* 33.5*  MCV 92.9 94.7 97.8 91.8  PLT 191 199 224 277   Blood Culture    Component Value Date/Time   SDES BLOOD RIGHT HAND 08/13/2014 0630   SPECREQUEST BOTTLES DRAWN AEROBIC ONLY 5CC 08/13/2014 0630   CULT NO GROWTH 5 DAYS 08/13/2014 0630   REPTSTATUS 08/18/2014 FINAL 08/13/2014 0630    CBG:  Recent Labs Lab 08/23/14 0809 08/23/14 1216 08/23/14 1630 08/23/14 2049 08/24/14 0547  GLUCAP 122* 145* 113* 117* 131*    Studies/Results: Ct Head Wo Contrast  08/23/2014   CLINICAL DATA:  Left arm and left leg weakness. Missed the last 2 dialysis treatments. In the morning of 08/13/2014 he  developed acute onset shortness of breath and became unresponsive affiliated EMS began CPR. Total Downtime estimated at 20 minutes.  EXAM: CT HEAD WITHOUT CONTRAST  TECHNIQUE: Contiguous axial images were obtained from the base of the skull through the vertex without intravenous contrast.  COMPARISON:  08/13/2014  FINDINGS: Diffuse cerebral atrophy. Mild ventricular dilatation consistent with central atrophy. Low-attenuation changes in the deep white matter consistent with small vessel ischemia. No mass effect or midline shift. No abnormal extra-axial fluid collections. Gray-white matter junctions are distinct. Basal cisterns are not effaced. No evidence of acute intracranial hemorrhage. No depressed skull fractures. Postoperative changes in the paranasal sinuses. Mucosal thickening in the maxillary antra. Mastoid air cells are not opacified. Ectatic and calcified intracranial internal carotid arteries.  IMPRESSION: No acute intracranial abnormalities. Chronic atrophy and small vessel ischemic changes. Vascular atherosclerotic changes.   Electronically Signed   By: Lucienne Capers M.D.   On: 08/23/2014 01:26   Medications:   . sodium chloride   Intravenous Once  . antiseptic oral rinse  7 mL Mouth Rinse q12n4p  . Darbepoetin Alfa      . darbepoetin (ARANESP) injection - DIALYSIS  100 mcg Intravenous Q Tue-HD  . ferric gluconate (FERRLECIT/NULECIT) IV  62.5 mg Intravenous Q Thu-HD  . insulin aspart  0-9 Units Subcutaneous TID AC  . pantoprazole (PROTONIX) IV  40 mg Intravenous QHS  . sodium chloride  10-40 mL Intracatheter Q12H  . Warfarin - Pharmacist Dosing Inpatient   Does not apply [phone]

## 2014-08-24 NOTE — Procedures (Signed)
Intubation Procedure Note Daniel Whitney [id number] 03-14-1955  Procedure: Intubation Indications: Respiratory insufficiency  Procedure Details Consent: Unable to obtain consent because of emergent medical necessity. Time Out: Verified patient identification, verified procedure, site/side was marked, verified correct patient position, special equipment/implants available, medications/allergies/relevent history reviewed, required imaging and test results available.  Performed  Maximum sterile technique was used including gloves, gown, hand hygiene and mask.  MAC and 4    Evaluation Hemodynamic Status: BP stable throughout; O2 sats: stable throughout Patient's Current Condition: unstable Complications: No apparent complications Patient did tolerate procedure well. Chest X-ray ordered to verify placement.  CXR: pending.   Daniel Whitney 08/24/2014  Frothy secretions all in airway

## 2014-08-24 NOTE — Progress Notes (Addendum)
ANTIBIOTIC CONSULT NOTE - INITIAL ANTICOAGULATION CONSULT NOTE - Follow Up Consult  Pharmacy Consult for Vancomycin and Ceftazidime; Coumadin and Heparin Indication: r/o sepsis; atrial fibrillation  No Known Allergies  Patient Measurements: Height: 5\' 9"  (175.3 cm) Weight: 230 lb 2.6 oz (104.4 kg) IBW/kg (Calculated) : 70.7  Heparin dosing weight: ~92kg  Vital Signs: Temp: 100.1 F (37.8 C) (07/12 1140) Temp Source: Oral (07/12 0502) BP: 126/70 mmHg (07/12 1000) Pulse Rate: 82 (07/12 1000)   Intake/Output from previous day: 07/11 0701 - 07/12 0700 In: 700 [P.O.:700] Out: 0  Intake/Output from this shift:    Labs:  Recent Labs  08/22/14 0459 08/24/14 0809  WBC 11.2* 26.4*  HGB 10.8* 10.9*  PLT 224 277  CREATININE  --  8.59*   Estimated Creatinine Clearance: 10.9 mL/min (by C-G formula based on Cr of 8.59).  Microbiology: Recent Results (from the past 720 hour(s))  Culture, blood (routine x 2)     Status: None   Collection Time: 08/13/14  2:50 AM  Result Value Ref Range Status   Specimen Description BLOOD RIGHT HAND  Final   Special Requests BOTTLES DRAWN AEROBIC ONLY [place]  Final   Culture NO GROWTH 5 DAYS  Final   Report Status 08/18/2014 FINAL  Final  Culture, blood (routine x 2)     Status: None   Collection Time: 08/13/14  6:30 AM  Result Value Ref Range Status   Specimen Description BLOOD RIGHT HAND  Final   Special Requests BOTTLES DRAWN AEROBIC ONLY 5CC  Final   Culture NO GROWTH 5 DAYS  Final   Report Status 08/18/2014 FINAL  Final  MRSA PCR Screening     Status: None   Collection Time: 08/13/14  7:09 AM  Result Value Ref Range Status   MRSA by PCR NEGATIVE NEGATIVE Final    Comment:        The GeneXpert MRSA Assay (FDA approved for NASAL specimens only), is one component of a comprehensive MRSA colonization surveillance program. It is not intended to diagnose MRSA infection nor to guide or monitor treatment for MRSA infections.     Assessment: 59yom with ESRD on HD TTS developed chills at dialysis today along with fever to 100.1. WBC has also jumped from 11 to 26. He will begin vancomycin and ceftazidime for possible sepsis.   He also continues on coumadin for his afib. INR was 6.82 on admission, coumadin was held, and he was reversed with vitamin k (5mg  IV on 7/1 and 2mg  IV on 7/8). Coumadin then resumed on 7/10. INR was therapeutic for 2 days but has now dropped below goal to 1.56 - likely seeing effects of vitamin k. May need to resume heparin.  CBC stable. No bleeding reported.  Goal of Therapy:  Pre HD vancomycin level 15-25 INR 2-3  Plan:  1) Vancomycin 2g IV x 1 then 1g IV qHD 2) Ceftazidime 2g IV x 1 then 2g IV qHD 3) Increase coumadin to 6mg  x 1 4) Follow dialysis schedule/tolerance, INR  Deboraha Sprang 08/24/2014,1:17 PM  Addendum: Pharmacy now asked to resume heparin due to subtherapeutic INR. Previously therapeutic on 1850 units/hr so will resume at this rate.  Goal: Heparin level 0.3-0.7  Plan: 1) Start heparin at 1850 units/hr with no bolus 2) Check 8 hour heparin level 3) Daily heparin level and CBC  Deboraha Sprang 08/24/2014, 1:53 PM

## 2014-08-24 NOTE — Procedures (Signed)
Central Venous Catheter Insertion Procedure Note Emiel Leong [id number] 03/22/1955  Procedure: Insertion of Central Venous Catheter Indications: Assessment of intravascular volume, Drug and/or fluid administration and Frequent blood sampling  Procedure Details Consent: Unable to obtain consent because of emergent medical necessity. Time Out: Verified patient identification, verified procedure, site/side was marked, verified correct patient position, special equipment/implants available, medications/allergies/relevent history reviewed, required imaging and test results available.  Performed  Maximum sterile technique was used including antiseptics, cap, gloves, gown, hand hygiene, mask and sheet. Skin prep: Chlorhexidine; local anesthetic administered A antimicrobial bonded/coated triple lumen catheter was placed in the left IJ vein using the Seldinger technique. Survey [place]- no sig rt ij to stick Evaluation Blood flow good Complications: No apparent complications Patient did tolerate procedure well. Chest X-ray ordered to verify placement.  CXR: pending.  Raylene Miyamoto 08/24/2014, 5:17 PM  [place] i also drew Blood cult from my needle in neck IJ  Lavon Paganini. Titus Mould, MD, [place] Pgr: [facility]'

## 2014-08-24 NOTE — Progress Notes (Addendum)
Inpatient Rehabilitation  I met with Daniel Whitney during his HD session to discuss the possibility of IP Rehab.  He indicates does want to come to IP Rehab when able.  I have reached out to patient's brother [name] to obtain further information.  Pt . Lives with brother [name] who is currently at work and not available by phone.    Pt. Currently has elevated white count and is to have blood cultures and UA per Amalia Hailey, PA.  Will await results and  medical readiness for possible rehab admission.  I have updated [name], CM and [name], SW.  Please call if questions.  [facility] Admissions Coordinator Cell [phone] Office [phone]  Addendum:  [phone]  I received a call from pt's sister [name] in [place],  inquiring about pt's rehab possibilities.  I updated her and told her we will keep the family apprised of DC plans.

## 2014-08-24 NOTE — Progress Notes (Signed)
PT Cancellation Note  Patient Details Name: Daniel Whitney MRN: [id number] DOB: [birth year]   Cancelled Treatment:    Reason Eval/Treat Not Completed: Patient at procedure or test/unavailable (Pt in HD.  Will return as able today or check on pt tomorrow)   Denice Paradise 08/24/2014, 8:20 AM [facility] Rehabilitation [phone] (pager)

## 2014-08-24 NOTE — Progress Notes (Signed)
Lactic of 2.2 was reported to the doctor at [phone]

## 2014-08-24 NOTE — Progress Notes (Signed)
RT was called to patient room on 3E21 due to patient having increased work of breathing and use of accessory muscles.  Was placed on non-rebreather mask in attempt to decrease work of breathing.  Was transferred, with rapid response RN, to room [id number] without complication.  Was intubated with positive color change, bilateral breath sounds noted, chest xray pending and will obtain follow up ABG.  Will continue to monitor.

## 2014-08-24 NOTE — Progress Notes (Signed)
PULMONARY / CRITICAL CARE MEDICINE   Name: Daniel Whitney MRN: [id number] DOB: April 09, 1955    ADMISSION DATE:  08/13/2014 CONSULTATION DATE:  08/13/2014  REFERRING MD :  Dr. Lita Mains  CHIEF COMPLAINT: Asystole  INITIAL PRESENTATION:  59 year old male with [facility] WITNESs (will not take blood products) , ESRD on HD had missed his last 2 dialysis treatments. 7/01 early AM he developed acute onset SOB and then became unresponsive. EMS noted asystole and began CPR. Total downtime estimated at about 20 mins. In ED he had several runs of VT, but was otherwise was hemodynamically stable on ventilator. PCCM to admit.  STUDIES:  7/01 CT head: NAICP 7/01 EEG: This sedated EEG is abnormal due to moderate diffuse slowing and suppression of the background 7/01 TTE: LVEF 20% 7/1 echo EF 20%, grade 2 DD 7/11 CT head > no acute  SIG EVENTS: 7/1 arrest, cooling started 7/2- HD, rewarm 7/9 extubated  SUBJECTIVE/OVERNIGHT/INTERVAL HX Patient was stable on floor last night, but this am 7/12 he was noted to be febrile with markedly elevated WBC since the day prior. He went to dialysis as scheduled where he was started on vancomycin and ceftaz. Shortly after returning from HD he developed acute onset SOB and fever of 104F. He was transferred to ICU for PCCM eval.   VITAL SIGNS: Temp:  [98.4 F (36.9 C)-104.7 F (40.4 C)] 100.2 F (37.9 C) (07/12 1550) Pulse Rate:  [78-162] 162 (07/12 1515) Resp:  [18-32] 32 (07/12 1515) BP: (108-146)/(50-80) 140/80 mmHg (07/12 1515) SpO2:  [95 %-98 %] 95 % (07/12 1319) Weight:  [103.6 kg (228 lb 6.3 oz)-104.4 kg (230 lb 2.6 oz)] 104.4 kg (230 lb 2.6 oz) (07/12 0733) HEMODYNAMICS:   VENTILATOR SETTINGS:   INTAKE / OUTPUT:  Intake/Output Summary (Last 24 hours) at 08/24/14 1612 Last data filed at 08/24/14 1342  Gross per 24 hour  Intake    460 ml  Output      1 ml  Net    459 ml    PHYSICAL EXAMINATION: General: Obese male in severe distress  Neuro:  Very  agitated, spontaneously alert. Minimally responsive to verbal  HEENT:  [place]/AT, PERRL, JVD difficult to discern due to neck girth. Cardiovascular:  Irreg irreg, tachy 150s Lungs: Poor air entry throughout Abdomen:  Obese, soft, non-distended Ext:  +1 symmetric BLE edema, not pitting  Skin:  Grossly intact  LABS: PULMONARY  Recent Labs Lab 08/20/14 1224  PHART 7.323*  PCO2ART 62.3*  PO2ART 86.0  HCO3 32.3*  TCO2 34  O2SAT 95.0    CBC  Recent Labs Lab 08/22/14 0459 08/24/14 0809 08/24/14 1551  HGB 10.8* 10.9* 11.1*  HCT 35.5* 33.5* 35.1*  WBC 11.2* 26.4* 13.1*  PLT 224 277 240    COAGULATION  Recent Labs Lab 08/18/14 1650 08/20/14 0450 08/23/14 0349 08/24/14 0340  INR 1.97* 2.07* 2.03* 1.56*    CARDIAC   No results for input(s): TROPONINI in the last 168 hours. No results for input(s): PROBNP in the last 168 hours.   CHEMISTRY  Recent Labs Lab 08/18/14 0520 08/18/14 1735 08/19/14 0410 08/20/14 0450 08/24/14 0809  NA 137  --  137 135 131*  K 4.3  --  4.4 3.6 4.4  CL 98*  --  99* 96* 94*  CO2 28  --  29 31 23   GLUCOSE 126*  --  148* 135* 153*  BUN 77*  --  57* 53* 83*  CREATININE 6.04*  --  4.50* 4.09* 8.59*  CALCIUM 8.7*  --  8.9 9.2 10.0  MG  --  1.9  --   --   --   PHOS 5.4*  --   --  4.0 5.0*   Estimated Creatinine Clearance: 10.9 mL/min (by C-G formula based on Cr of 8.59).   LIVER  Recent Labs Lab 08/18/14 0520 08/18/14 1650 08/20/14 0450 08/21/14 0345 08/23/14 0349 08/24/14 0340 08/24/14 0809  AST  --   --   --  18  --   --   --   ALT  --   --   --  15*  --   --   --   ALKPHOS  --   --   --  173*  --   --   --   BILITOT  --   --   --  0.8  --   --   --   PROT  --   --   --  6.1*  --   --   --   ALBUMIN 2.1*  --  2.3* 2.3*  --   --  2.6*  INR  --  1.97* 2.07*  --  2.03* 1.56*  --      INFECTIOUS No results for input(s): LATICACIDVEN, PROCALCITON in the last 168 hours.   ENDOCRINE CBG (last 3)   Recent Labs   08/24/14 1242 08/24/14 1523 08/24/14 1547  GLUCAP 90 79 131*     IMAGING x48h Ct Head Wo Contrast  08/23/2014   CLINICAL DATA:  Left arm and left leg weakness. Missed the last 2 dialysis treatments. In the morning of 08/13/2014 he developed acute onset shortness of breath and became unresponsive affiliated EMS began CPR. Total Downtime estimated at 20 minutes.  EXAM: CT HEAD WITHOUT CONTRAST  TECHNIQUE: Contiguous axial images were obtained from the base of the skull through the vertex without intravenous contrast.  COMPARISON:  08/13/2014  FINDINGS: Diffuse cerebral atrophy. Mild ventricular dilatation consistent with central atrophy. Low-attenuation changes in the deep white matter consistent with small vessel ischemia. No mass effect or midline shift. No abnormal extra-axial fluid collections. Gray-white matter junctions are distinct. Basal cisterns are not effaced. No evidence of acute intracranial hemorrhage. No depressed skull fractures. Postoperative changes in the paranasal sinuses. Mucosal thickening in the maxillary antra. Mastoid air cells are not opacified. Ectatic and calcified intracranial internal carotid arteries.  IMPRESSION: No acute intracranial abnormalities. Chronic atrophy and small vessel ischemic changes. Vascular atherosclerotic changes.   Electronically Signed   By: Lucienne Capers M.D.   On: 08/23/2014 01:26   Dg Chest Port 1 View  08/24/2014   CLINICAL DATA:  Right-sided chest pain, elevated white blood cell count.  EXAM: PORTABLE CHEST - 1 VIEW  COMPARISON:  Portable chest x-ray of August 22, 2014  FINDINGS: Positioning is more straight AP today. The lungs are adequately inflated. There remain increased interstitial lung markings at both bases. The cardiac silhouette remains enlarged. The central pulmonary vascularity is engorged and indistinct. There is a left subclavian venous catheter whose tip projects over the junction of the right and left brachiocephalic veins. The  bony thorax is unremarkable.  IMPRESSION: 1. Persistent lower lobe increased interstitial markings which may reflect interstitial edema or less likely interstitial pneumonia. 2. Mild cardiomegaly and pulmonary vascular congestion consistent with CHF.   Electronically Signed   By: David  Martinique M.D.   On: 08/24/2014 13:58    ASSESSMENT / PLAN:  PULMONARY ETT 7/1 >> 08/16/14, 7/4 (self extubated and  reintubated) >>08/21/14, 7/12 >>> A: Acute respiratory failure in setting pulmonary edema OSA/OHS - dx years ago per patient . Not used CPAP in years but supposed to  P:   STAT intubation Full vent support CXR ABG Keep sats > 88% VAP bundle Will need OPD sleep MD refrral   CARDIOVASCULAR L [place] CVL 7/01 >>  A:  Shock, suspect septic Asystolic cardiac arrest 7/1 (inital presentation) Acute on chronic systolic/diastolic CHF Severe cardiomyopathy AF RVR (now sinus) Prolonged QTc H/o HTN, CAD  P:  Telemetry monitoring Phenylephrine for MAP goal 60 CVP monitoring goal 12-15 Check lactic, troponin EKG  RENAL A:   ESRD on HD - Noncompliance with HD - MWF Hypomagnesemia   P:   Renal following Volume removal per renal Replete Mag 1g  GASTROINTESTINAL A:   Dysphagia  P:   Has failed swallow evals NPO for now Consider TF in AM SUP: IV PPI   HEMATOLOGIC A:   Jehovah's witness - family confirms 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 Anemia of chronic illness Excessive warfarin anticoagulation - at admit  P:  Follow CBC Heparin gtt LIMIT draws when able [facility] witness) Hold warfarin  INFECTIOUS A:   Septic shock HCAP vs C-dif vs line infection Fevers (105F on 7/18)  P:   BCx2 7/12 >>> Sputum 7/12 >>> Abx: Ceftaz 7/12 >>> Abx: IV vanc 7/12 >>> Abx: Flagyl 7/12 >>> Abx: PO vanc 7/12 >>> PCT algorithm  PRN tylenol, cooling blanket  ENDOCRINE A:   DM 2  P:   CBG monitoring and SSI  NEUROLOGIC A:   L3 fracture (chronic) Acute anoxic encephalopathy (improved) Acute  metabolic encephalopathy in setting sepsis  P:   RASS goal: -1 Fentanyl gtt Monitor  FAMILY  - Updates: none present   - Inter-disciplinary family meet or Palliative Care meeting due by:  7/19   Georgann Housekeeper, AGACNP-BC [place] [facility] Pager [phone] or [phone]  08/24/2014 5:06 PM   STAFF NOTE: Linwood Dibbles, MD FACP have personally reviewed patient's available data, including medical history, events of note, physical examination and test results as part of my evaluation. I have discussed with resident/NP and other care providers such as pharmacist, RN and RRT. In addition, I personally evaluated patient and elicited key findings of: down after HD with severe resp distress, new fib rvr, temp 104, ronchi on examination, required emergent intubation, then pcxr and abg to follow, in shock, LA pending, start septic shock protocol, add neo, if fails transition to levo, may need vaso, need to place new line for pressors and dc line old in case is source, also concern is HCAP, repeat STAT pcxr, may need cvvhd in am, follow bmet, add ceftaz, vanc, BC now, sputum assessment, give 30 cc/kg, repeat LA, re assessment needed post fluids start, will need trach at this point after such a long hospital course on vent prior, new loose stools, r/o cdiff, send cdiff, add empiric oral vanc, flagyl, isolate The patient is critically ill with multiple organ systems failure and requires high complexity decision making for assessment and support, frequent evaluation and titration of therapies, application of advanced monitoring technologies and extensive interpretation of multiple databases.   Critical Care Time devoted to patient care services described in this note is85  Minutes. This time reflects time of care of this signee: Merrie Roof, MD FACP. This critical care time does not reflect procedure time, or teaching time or supervisory time of PA/NP/Med student/Med Resident  etc but could involve care discussion time. Rest  per NP/medical resident whose note is outlined above and that I agree with   Lavon Paganini. Titus Mould, MD, [place] Pgr: [facility] 08/24/2014 5:20 PM

## 2014-08-24 NOTE — Progress Notes (Signed)
[facility] PROGRESS NOTE  Daniel Whitney [id number] DOB: [birth year] DOA: 08/13/2014 PCP: Cathlean Cower, MD  Assessment/Plan: 1. Sepsis -Developing over the past 24 hours, evidence by acute encephalopathy, temperature of 104.7, heart rate of 160, white count of 26,400 -Patient was given a dose of vancomycin and Fortaz with hemodialysis today. -Blood cultures were obtained -Repeat stat portable chest x-ray revealed persistent lower lobe increased interstitial markings that could reflect interstitial edema per radiology. -Possible sources of infection include aspiration pneumonia given history of dysphasia and failing swallow study during this hospitalization. The possibilities include healthcare associated pneumonia as well as line infection given presence of central venous catheter. He is also a hemodialysis patient.  -Patient unstable, PCCM consulted as he was transferred to the intensive care unit immediately -I think he will likely require intubation.  2. Cardiac arrest -Patient with history of end-stage renal disease missing his last 2 dialysis sessions found unresponsive on the field, undergoing cardiopulmonary resuscitation. He had returned to spontaneous circulation undergoing cooling protocol with admission to the intensive care unit. -Transthoracic echocardiogram revealed an ejection fraction of 20% -He was successfully extubated on 08/21/2014, remains hemodynamically stable -His could have been secondary to ARDS or pulmonary edema, has by missing hemodialysis.  2.  End-stage renal disease. -Patient unfortunately having history noncompliance with hemodialysis. Dialysis schedule have him on Mondays Wednesdays and Fridays. Patient undergoing hemodialysis during this hospitalization. -Nephrology following. -His last hemodialysis was on 08/24/2014.   3.  History of atrial fibrillation. -Presently rate controlled -Anticoagulation with warfarin -Pharmacy consulted for Coumadin  dosing  4.  Anemia of chronic disease -Hemoglobin stable at 10.8 -Continue iron  5.  Dysphasia -Patient failing swallow eval -Will have pathology reassess  6.  Deconditioning -Patient having prolonged hospitalization, deconditioning. Has been accepted at inpatient rehabilitation -Now transferred to ICU  7.  History of CVA -Patient having residual right-sided hemiparesis, overnight felt hemiparesis may have worsened. - CT scan of the brain performed overnight did not show acute intracranial abnormalities.  Code Status: Full code Family Communication: Family not present Disposition Plan: Patient transferred to ICU   Consultants:  Pulmonary medicine  Procedures: 7/01 CT head: NAICP 7/01 EEG: This sedated EEG is abnormal due to moderate diffuse slowing and suppression of the background 7/01 TTE: LVEF 20% 7/1 cardiac arrest asystole > to ICU. Arcitc sun protocol 7/2- HD, rewarm 08/15/14: :peep to 5 successful  08/16/14: SEfl extubated and promptly reintubated 08/17/14: Self extubated yesterday while off dipriva and promptly reintubated. Following commands per RN. On low dose levophed. HD ongoing.  7/6- daily hd schedule changed 7./8/16: awake 08/21/2014: Doing SBT on low dose diprivan. HD ongoing -> EXTUBATED   HPI/Subjective: Patient is a pleasant 59 year old gentleman with a past medical history of end-stage renal disease on hemodialysis, chronic diastolic congestive heart failure, hypertension, atrial fibrillation, was admitted to the pulmonary critical care service on 08/13/2014, found unresponsive, pulseless, EMS providing cardiopulmonary resuscitation. Patient was resuscitated for approximately 20 minutes. He was intubated and admitted to the intensive care unit. Unfortunately patient had reported missing his last 2 sessions of hemodialysis. Initial workup included a CT scan of brain which did not show acute intracranial process. A transthoracic echocardiogram performed on  08/13/2014 revealed EF of 20% with akinetic mid to apical LV segments. Postarrest EKG showed atrial fibrillation. There were no ischemic changes seen. Patient underwent cooling protocol. He was extubated on 08/21/2014. Patient found to be doing well post extubation. Transferred to the floor on 08/22/2014. He was seen by physical  therapy recommended CIR consultation.  On 08/24/2014 I evaluated patient after he came down from hemodialysis. RN reporting that he was stable when they brought him down from hemodialysis. When I evaluated him he was encephalopathic, having rigors, unable to follow commands. Rapid response was called. He was found to have a temperature of 104.7 with heart rate of 166. PCCM consulted as he was transferred to the intensive care unit.  Objective: Filed Vitals:   08/24/14 1515  BP: 140/80  Pulse:   Temp: 104.7 F (40.4 C)  Resp:     Intake/Output Summary (Last 24 hours) at 08/24/14 1537 Last data filed at 08/24/14 1342  Gross per 24 hour  Intake    460 ml  Output      1 ml  Net    459 ml   Filed Weights   08/23/14 0504 08/24/14 0502 08/24/14 0733  Weight: 104.917 kg (231 lb 4.8 oz) 103.6 kg (228 lb 6.3 oz) 104.4 kg (230 lb 2.6 oz)    Exam:   General:  Patient is toxic appearing, actively having chills, riders, encephalopathic, unable to follow commands, significant deterioration from yesterday's exam  Cardiovascular: Tachycardic, Regular rate and rhythm normal S1-S2 no murmurs rubs or gallops  Respiratory: Normal respiratory effort, lungs are clear to auscultation bilaterally  Abdomen: Soft nontender nondistended  Musculoskeletal: No edema  Neurological: Could not perform adequate neurologic exam due to patient being unable to follow commands. He is encephalopathic, though appears to be moving all extremities.  Skin: Did not find source of infection from skin exam, central line site appears clean without evidence of erythema or purulence  Data  Reviewed: Basic Metabolic Panel:  Recent Labs Lab 08/18/14 0520 08/18/14 1735 08/19/14 0410 08/20/14 0450 08/24/14 0809  NA 137  --  137 135 131*  K 4.3  --  4.4 3.6 4.4  CL 98*  --  99* 96* 94*  CO2 28  --  29 31 23   GLUCOSE 126*  --  148* 135* 153*  BUN 77*  --  57* 53* 83*  CREATININE 6.04*  --  4.50* 4.09* 8.59*  CALCIUM 8.7*  --  8.9 9.2 10.0  MG  --  1.9  --   --   --   PHOS 5.4*  --   --  4.0 5.0*   Liver Function Tests:  Recent Labs Lab 08/18/14 0520 08/20/14 0450 08/21/14 0345 08/24/14 0809  AST  --   --  18  --   ALT  --   --  15*  --   ALKPHOS  --   --  173*  --   BILITOT  --   --  0.8  --   PROT  --   --  6.1*  --   ALBUMIN 2.1* 2.3* 2.3* 2.6*   No results for input(s): LIPASE, AMYLASE in the last 168 hours. No results for input(s): AMMONIA in the last 168 hours. CBC:  Recent Labs Lab 08/18/14 0520 08/20/14 0450 08/22/14 0459 08/24/14 0809  WBC 13.0* 11.4* 11.2* 26.4*  NEUTROABS  --  8.4*  --  23.7*  HGB 9.6* 10.0* 10.8* 10.9*  HCT 30.1* 32.3* 35.5* 33.5*  MCV 92.9 94.7 97.8 91.8  PLT 191 199 224 277   Cardiac Enzymes: No results for input(s): CKTOTAL, CKMB, CKMBINDEX, TROPONINI in the last 168 hours. BNP (last 3 results) No results for input(s): BNP in the last 8760 hours.  ProBNP (last 3 results)  Recent Labs  01/29/14 [phone]  PROBNP 7824.0*    CBG:  Recent Labs Lab 08/23/14 1630 08/23/14 2049 08/24/14 0547 08/24/14 1242 08/24/14 1523  GLUCAP 113* 117* 131* 90 79    No results found for this or any previous visit (from the past 240 hour(s)).   Studies: Ct Head Wo Contrast  08/23/2014   CLINICAL DATA:  Left arm and left leg weakness. Missed the last 2 dialysis treatments. In the morning of 08/13/2014 he developed acute onset shortness of breath and became unresponsive affiliated EMS began CPR. Total Downtime estimated at 20 minutes.  EXAM: CT HEAD WITHOUT CONTRAST  TECHNIQUE: Contiguous axial images were obtained from the  base of the skull through the vertex without intravenous contrast.  COMPARISON:  08/13/2014  FINDINGS: Diffuse cerebral atrophy. Mild ventricular dilatation consistent with central atrophy. Low-attenuation changes in the deep white matter consistent with small vessel ischemia. No mass effect or midline shift. No abnormal extra-axial fluid collections. Gray-white matter junctions are distinct. Basal cisterns are not effaced. No evidence of acute intracranial hemorrhage. No depressed skull fractures. Postoperative changes in the paranasal sinuses. Mucosal thickening in the maxillary antra. Mastoid air cells are not opacified. Ectatic and calcified intracranial internal carotid arteries.  IMPRESSION: No acute intracranial abnormalities. Chronic atrophy and small vessel ischemic changes. Vascular atherosclerotic changes.   Electronically Signed   By: Lucienne Capers M.D.   On: 08/23/2014 01:26   Dg Chest Port 1 View  08/24/2014   CLINICAL DATA:  Right-sided chest pain, elevated white blood cell count.  EXAM: PORTABLE CHEST - 1 VIEW  COMPARISON:  Portable chest x-ray of August 22, 2014  FINDINGS: Positioning is more straight AP today. The lungs are adequately inflated. There remain increased interstitial lung markings at both bases. The cardiac silhouette remains enlarged. The central pulmonary vascularity is engorged and indistinct. There is a left subclavian venous catheter whose tip projects over the junction of the right and left brachiocephalic veins. The bony thorax is unremarkable.  IMPRESSION: 1. Persistent lower lobe increased interstitial markings which may reflect interstitial edema or less likely interstitial pneumonia. 2. Mild cardiomegaly and pulmonary vascular congestion consistent with CHF.   Electronically Signed   By: David  Martinique M.D.   On: 08/24/2014 13:58    Scheduled Meds: . sodium chloride   Intravenous Once  . antiseptic oral rinse  7 mL Mouth Rinse q12n4p  . [START ON 08/26/2014]  cefTAZidime (FORTAZ)  IV  2 g Intravenous Q T,Th,Sa-HD  . darbepoetin (ARANESP) injection - DIALYSIS  100 mcg Intravenous Q Tue-HD  . ferric gluconate (FERRLECIT/NULECIT) IV  62.5 mg Intravenous Q Thu-HD  . insulin aspart  0-9 Units Subcutaneous TID AC  . multivitamin  1 tablet Oral QHS  . pantoprazole (PROTONIX) IV  40 mg Intravenous QHS  . sevelamer carbonate  800 mg Oral TID WC  . sodium chloride  10-40 mL Intracatheter Q12H  . vancomycin  2,000 mg Intravenous Once  . [START ON 08/26/2014] vancomycin  1,000 mg Intravenous Q T,Th,Sa-HD  . warfarin  6 mg Oral ONCE-1800  . Warfarin - Pharmacist Dosing Inpatient   Does not apply q1800   Continuous Infusions: . heparin      Active Problems:   Asystole   Cardiopulmonary arrest   ARDS (adult respiratory distress syndrome)   Encounter for central line placement   Cardiac arrest   Respiratory failure    Time spent: 55 min    Jaonna Word, [facility] Pager [phone]. If 7PM-7AM, please contact night-coverage at [email or web address], password  [email or web address] 08/24/2014, 3:37 PM  LOS: 11 days

## 2014-08-24 NOTE — Progress Notes (Signed)
Brother Richardson Landry updated regarding patient's condition. Message left for patient's brother [name].  Roselyn Reef Buffie Herne,RN

## 2014-08-25 DIAGNOSIS — R6521 Severe sepsis with septic shock: Secondary | ICD-10-CM

## 2014-08-25 LAB — MAGNESIUM: Magnesium: 1.8 mg/dL (ref 1.7–2.4)

## 2014-08-25 LAB — POCT I-STAT 3, ART BLOOD GAS (G3+)
ACID-BASE EXCESS: 1 mmol/L (ref 0.0–2.0)
Bicarbonate: 26.2 mEq/L — ABNORMAL HIGH (ref 20.0–24.0)
O2 SAT: 100 %
PCO2 ART: 47 mmHg — AB (ref 35.0–45.0)
PO2 ART: 264 mmHg — AB (ref 80.0–100.0)
Patient temperature: 102.5
TCO2: 27 mmol/L (ref 0–100)
pH, Arterial: 7.363 (ref 7.350–7.450)

## 2014-08-25 LAB — GLUCOSE, CAPILLARY
GLUCOSE-CAPILLARY: 164 mg/dL — AB (ref 65–99)
Glucose-Capillary: 112 mg/dL — ABNORMAL HIGH (ref 65–99)
Glucose-Capillary: 113 mg/dL — ABNORMAL HIGH (ref 65–99)
Glucose-Capillary: 190 mg/dL — ABNORMAL HIGH (ref 65–99)
Glucose-Capillary: 95 mg/dL (ref 65–99)

## 2014-08-25 LAB — TROPONIN I
Troponin I: 0.07 ng/mL — ABNORMAL HIGH (ref <0.031)
Troponin I: 0.06 ng/mL — ABNORMAL HIGH (ref <0.031)

## 2014-08-25 LAB — BLOOD GAS, ARTERIAL
Acid-base deficit: 2.3 mmol/L — ABNORMAL HIGH (ref 0.0–2.0)
Bicarbonate: 22.4 mEq/L (ref 20.0–24.0)
Drawn by: 44137
FIO2: 0.4 %
LHR: 16 {breaths}/min
O2 SAT: 97.6 %
PATIENT TEMPERATURE: 98.6
PCO2 ART: 40.7 mmHg (ref 35.0–45.0)
PEEP: 0.5 cmH2O
TCO2: 23.6 mmol/L (ref 0–100)
VT: 510 mL
pH, Arterial: 7.359 (ref 7.350–7.450)
pO2, Arterial: 109 mmHg — ABNORMAL HIGH (ref 80.0–100.0)

## 2014-08-25 LAB — URINE MICROSCOPIC-ADD ON

## 2014-08-25 LAB — COMPREHENSIVE METABOLIC PANEL
ALBUMIN: 2 g/dL — AB (ref 3.5–5.0)
ALK PHOS: 193 U/L — AB (ref 38–126)
ALT: 22 U/L (ref 17–63)
ANION GAP: 12 (ref 5–15)
AST: 30 U/L (ref 15–41)
BUN: 34 mg/dL — ABNORMAL HIGH (ref 6–20)
CALCIUM: 8.3 mg/dL — AB (ref 8.9–10.3)
CHLORIDE: 97 mmol/L — AB (ref 101–111)
CO2: 23 mmol/L (ref 22–32)
CREATININE: 5.62 mg/dL — AB (ref 0.61–1.24)
GFR calc Af Amer: 11 mL/min — ABNORMAL LOW (ref >60)
GFR calc non Af Amer: 10 mL/min — ABNORMAL LOW (ref >60)
GLUCOSE: 178 mg/dL — AB (ref 65–99)
Potassium: 4.4 mmol/L (ref 3.5–5.1)
Sodium: 132 mmol/L — ABNORMAL LOW (ref 135–145)
Total Bilirubin: 1.1 mg/dL (ref 0.3–1.2)
Total Protein: 5.6 g/dL — ABNORMAL LOW (ref 6.5–8.1)

## 2014-08-25 LAB — URINALYSIS, ROUTINE W REFLEX MICROSCOPIC
GLUCOSE, UA: NEGATIVE mg/dL
KETONES UR: 15 mg/dL — AB
Nitrite: POSITIVE — AB
Specific Gravity, Urine: 1.027 (ref 1.005–1.030)
Urobilinogen, UA: 1 mg/dL (ref 0.0–1.0)
pH: 6.5 (ref 5.0–8.0)

## 2014-08-25 LAB — CBC
HCT: 31.2 % — ABNORMAL LOW (ref 39.0–52.0)
HEMOGLOBIN: 9.9 g/dL — AB (ref 13.0–17.0)
MCH: 30.2 pg (ref 26.0–34.0)
MCHC: 31.7 g/dL (ref 30.0–36.0)
MCV: 95.1 fL (ref 78.0–100.0)
Platelets: 271 10*3/uL (ref 150–400)
RBC: 3.28 MIL/uL — ABNORMAL LOW (ref 4.22–5.81)
RDW: 16 % — AB (ref 11.5–15.5)
WBC: 34.2 10*3/uL — AB (ref 4.0–10.5)

## 2014-08-25 LAB — PROTIME-INR
INR: 2.01 — ABNORMAL HIGH (ref 0.00–1.49)
PROTHROMBIN TIME: 22.7 s — AB (ref 11.6–15.2)

## 2014-08-25 LAB — PHOSPHORUS: Phosphorus: 4.4 mg/dL (ref 2.5–4.6)

## 2014-08-25 MED ORDER — INSULIN ASPART 100 UNIT/ML ~~LOC~~ SOLN: 0.0000 [IU] | SUBCUTANEOUS | Status: DC

## 2014-08-25 MED ORDER — AMIODARONE HCL IN DEXTROSE 360-4.14 MG/200ML-% IV SOLN
30.0000 mg/h | INTRAVENOUS | Status: DC
  Administered 2014-08-25 – 2014-08-27 (×6): 30 mg/h via INTRAVENOUS
  Filled 2014-08-25 (×14): qty 200

## 2014-08-25 MED ORDER — SODIUM CHLORIDE 0.9 % IV SOLN: INTRAVENOUS | Status: DC

## 2014-08-25 MED ORDER — FENTANYL CITRATE (PF) 100 MCG/2ML IJ SOLN
25.0000 ug | INTRAMUSCULAR | Status: DC | PRN
  Administered 2014-08-26: 50 ug via INTRAVENOUS
  Filled 2014-08-25: qty 2

## 2014-08-25 MED ORDER — AMIODARONE HCL IN DEXTROSE 360-4.14 MG/200ML-% IV SOLN
INTRAVENOUS | Status: AC
  Filled 2014-08-25: qty 200

## 2014-08-25 NOTE — Progress Notes (Signed)
PT Cancellation Note  Patient Details Name: Daniel Whitney MRN: [id number] DOB: [birth year]   Cancelled Treatment:    Reason Eval/Treat Not Completed: Medical issues which prohibited therapy; RN reports patient not ready as intubated and still on pressors.  Will check again at later date.   WYNN,CYNDI 08/25/2014, 3:26 PM Magda Kiel, [place] 08/25/2014

## 2014-08-25 NOTE — Progress Notes (Signed)
Patient belongings (cell phone & ipad) were sent home with patient's brother [name].

## 2014-08-25 NOTE — Care Management Note (Signed)
Case Management Note  Patient Details  Name: Izzy Bickhart MRN: [id number] Date of Birth: Nov 26, 1955  Subjective/Objective:    Hypotensive, tachycardic, resp failure, intubated, on pressors.  ?? Need for Ltach.  Does have Medicare days available for Ltach if progresses that direction.                 Action/Plan:   Expected Discharge Date:                  Expected Discharge Plan:  Long Term Acute Care (LTAC)  In-House Referral:     Discharge planning Services     Post Acute Care Choice:    Choice offered to:     DME Arranged:    DME Agency:     HH Arranged:    HH Agency:     Status of Service:  In process, will continue to follow  Medicare Important Message Given:  Yes-fourth notification given Date Medicare IM Given:    Medicare IM give by:    Date Additional Medicare IM Given:    Additional Medicare Important Message give by:     If discussed at [place] of Stay Meetings, dates discussed:    Additional Comments:  Vergie Living, RN 08/25/2014, 11:52 AM

## 2014-08-25 NOTE — Progress Notes (Signed)
Wasted 250 cc bag of Fentanyl in the sink witnessed with Corinda Gubler, RN.

## 2014-08-25 NOTE — Progress Notes (Signed)
Patient received an Amiodarone bolus and was started on 60 mg/ per hour on day shift.  Dr. Elsworth Soho added the order as it was not correct in the [facility].  Patient is currently on 30 mg/per hour, per order.  Patient Afib rate is currently controlled.

## 2014-08-25 NOTE — Progress Notes (Signed)
OT Cancellation Note  Patient Details Name: Daniel Whitney MRN: [id number] DOB: 1955-07-06   Cancelled Treatment:    Reason Eval/Treat Not Completed: Patient's level of consciousness. Pt sedated and on vent. Will try to see tomorrow if appropriate.  Benito Mccreedy OTR/L [id number] 08/25/2014, 1:41 PM

## 2014-08-25 NOTE — Progress Notes (Signed)
[facility] Progress Note   Subjective: on vent, looking better, alert, tmax 105 yest, on one pressor  Filed Vitals:   08/25/14 0715 08/25/14 0718 08/25/14 0730 08/25/14 0830  BP: 121/69 121/69 100/68   Pulse: 109 82 109   Temp:    99 F (37.2 C)  TempSrc:    Oral  Resp: 16 16 16    Height:      Weight:      SpO2: 100% 100% 100%    Exam: On vent, alert, responsive No jvd Chest basilar rales bilat RRR 2/6 sem, no RG Abd soft ntnd no mass or ascites No LE edema Foley in place L arm AVF +bruit Neuro, alert, Ox 3  TTS [place] 4h  EDW 119kg will have lower EDW at discharge  LFA AVF Heparin 3600 450/800 Hect 4 , Venofer 50/wk, mircera 75 q2wks last 6/21      Assessment: 1. Septic shock / fever / acute resp failure / basilar pulm infiltrates - on emp abx, pressors, poss PNA, looks better today, alert 2. Afib on IV amio 3. S/P cardiac arrest - rx w cooling protocol  4. Vol overload/ marked pulm edema - resolved, new dry wt, euvolemic now 5. ESRD TTS HD 6. Anemia cont darbe 100/wk,+ weekly Fe on Tuesday Hgb 10.9 7. MBD phos 5.0, resume renvela 1 ac tid- Hectorol on hold 8. DM 2 9. Afib on coumadin, off heparin, INR 1.56 10. HTN not on any meds here, (coreg/ valsartan at home )   Plan - HD tomorrow    Kelly Splinter MD  pager [phone]    cell [phone]  08/25/2014, 9:06 AM     Recent Labs Lab 08/24/14 0809 08/24/14 1551 08/24/14 1736 08/25/14 0520  NA 131* 135 135 132*  K 4.4 4.7 4.1 4.4  CL 94* 97* 98* 97*  CO2 23 23 24 23   GLUCOSE 153* 149* 140* 178*  BUN 83* 30* 32* 34*  CREATININE 8.59* 4.87* 5.12* 5.62*  CALCIUM 10.0 8.7* 8.6* 8.3*  PHOS 5.0* 3.1  --  4.4    Recent Labs Lab 08/21/14 0345 08/24/14 0809 08/24/14 1736 08/25/14 0520  AST 18  --  33 30  ALT 15*  --  22 22  ALKPHOS 173*  --  306* 193*  BILITOT 0.8  --  1.6* 1.1  PROT 6.1*  --  6.3* 5.6*  ALBUMIN 2.3* 2.6* 2.4* 2.0*    Recent Labs Lab 08/20/14 0450   08/24/14 0809 08/24/14 1551 08/25/14 0520  WBC 11.4*  < > 26.4* 13.1* 34.2*  NEUTROABS 8.4*  --  23.7*  --   --   HGB 10.0*  < > 10.9* 11.1* 9.9*  HCT 32.3*  < > 33.5* 35.1* 31.2*  MCV 94.7  < > 91.8 94.9 95.1  PLT 199  < > 277 240 271  < > = values in this interval not displayed. . sodium chloride   Intravenous Once  . amiodarone      . antiseptic oral rinse  7 mL Mouth Rinse QID  . [START ON 08/26/2014] cefTAZidime (FORTAZ)  IV  2 g Intravenous Q T,Th,Sa-HD  . chlorhexidine  15 mL Mouth Rinse BID  . darbepoetin (ARANESP) injection - DIALYSIS  100 mcg Intravenous Q Tue-HD  . diltiazem  20 mg Intravenous Once  . fentaNYL (SUBLIMAZE) injection  50 mcg Intravenous Once  . ferric gluconate (FERRLECIT/NULECIT) IV  62.5 mg Intravenous Q Thu-HD  . heparin subcutaneous  5,000 Units Subcutaneous 3 times  per day  . insulin aspart  0-9 Units Subcutaneous 6 times per day  . metronidazole  500 mg Intravenous Q6H  . multivitamin  1 tablet Oral QHS  . pantoprazole (PROTONIX) IV  40 mg Intravenous QHS  . sevelamer carbonate  800 mg Oral TID WC  . sodium chloride  10-40 mL Intracatheter Q12H  . vancomycin  500 mg Oral 4 times per day  . [START ON 08/26/2014] vancomycin  1,000 mg Intravenous Q T,Th,Sa-HD   . sodium chloride 125 mL/hr (08/25/14 0028)  . amiodarone 30 mg/hr (08/25/14 0634)  . fentaNYL infusion INTRAVENOUS 100 mcg/hr (08/24/14 2030)  . norepinephrine (LEVOPHED) Adult infusion    . phenylephrine (NEO-SYNEPHRINE) Adult infusion 200 mcg/min (08/25/14 [id number])   acetaminophen (TYLENOL) oral liquid 160 mg/5 mL, fentaNYL, sodium chloride

## 2014-08-25 NOTE — Progress Notes (Signed)
PULMONARY / CRITICAL CARE MEDICINE   Name: Daniel Whitney MRN: [id number] DOB: 1955/04/03    ADMISSION DATE:  08/13/2014 CONSULTATION DATE:  08/13/2014  REFERRING MD :  Dr. Lita Mains  CHIEF COMPLAINT: Asystole  INITIAL PRESENTATION:  59 year old male with [facility] WITNESs (will not take blood products) , ESRD on HD had missed his last 2 dialysis treatments. 7/01 early AM he developed acute onset SOB and then became unresponsive. EMS noted asystole and began CPR. Total downtime estimated at about 20 mins. In ED he had several runs of VT, but was otherwise was hemodynamically stable on ventilator. PCCM to admit.  STUDIES:  7/01 CT head: NAICP 7/01 EEG: This sedated EEG is abnormal due to moderate diffuse slowing and suppression of the background 7/01 TTE: LVEF 20% 7/1 echo EF 20%, grade 2 DD 7/11 CT head > no acute  SIG EVENTS: 7/1 arrest, cooling started 7/2- HD, rewarm 7/9 extubated 7/13 reintubated 7/13 septic shock requiring vasopressors & atrial fibrillation with RVR  SUBJECTIVE/OVERNIGHT/INTERVAL HX No acute events overnight. Patient was successfully intubated by Dr. Titus Mould yesterday. Patient denies any difficulty breathing or chest discomfort currently on ventilator.  VITAL SIGNS: Temp:  [98.4 F (36.9 C)-101.6 F (38.7 C)] 101.6 F (38.7 C) (07/13 1527) Pulse Rate:  [25-147] 86 (07/13 1700) Resp:  [10-28] 26 (07/13 1700) BP: (85-137)/(43-95) 122/57 mmHg (07/13 1700) SpO2:  [74 %-100 %] 100 % (07/13 1700) FiO2 (%):  [40 %-100 %] 40 % (07/13 1600) HEMODYNAMICS: CVP:  [9 mmHg-15 mmHg] 15 mmHg VENTILATOR SETTINGS: Vent Mode:  [-] PRVC FiO2 (%):  [40 %-100 %] 40 % Set Rate:  [16 bmp] 16 bmp Vt Set:  [510 mL] 510 mL PEEP:  [5 cmH20] 5 cmH20 Plateau Pressure:  [21 cmH20-25 cmH20] 23 cmH20 INTAKE / OUTPUT:  Intake/Output Summary (Last 24 hours) at 08/25/14 1713 Last data filed at 08/25/14 1700  Gross per 24 hour  Intake 6787.13 ml  Output     30 ml  Net 6757.13 ml     PHYSICAL EXAMINATION: General: Obese. No distress. Awake. Currently on ventilator. Neuro:  Patient able to communicate through writing. Following commands. Moving all 4 extremities equally. HEENT:  PERRL. no scleral icterus. Endotracheal tube in place. Cardiovascular:  Regular rate. Sinus rhythm on telemetry. Unable to appreciate JVD due to body habitus. Lungs: Symmetric chest wall rise. Currently on ventilator. Normal work of breathing. Good aeration bilaterally. Abdomen:  Protuberant. Obese. Nontender. Ext:  No cyanosis or clubbing.  Integument:  Warm and dry. No rash on exposed skin. Left internal jugular central venous catheter in place.  LABS: PULMONARY  Recent Labs Lab 08/20/14 1224 08/24/14 1827 08/25/14 0818  PHART 7.323*  --  7.359  PCO2ART 62.3*  --  40.7  PO2ART 86.0  --  109*  HCO3 32.3* 25.2* 22.4  TCO2 34 27 23.6  O2SAT 95.0 83.0 97.6    CBC  Recent Labs Lab 08/24/14 0809 08/24/14 1551 08/25/14 0520  HGB 10.9* 11.1* 9.9*  HCT 33.5* 35.1* 31.2*  WBC 26.4* 13.1* 34.2*  PLT 277 240 271    COAGULATION  Recent Labs Lab 08/20/14 0450 08/23/14 0349 08/24/14 0340 08/24/14 1736 08/25/14 0520  INR 2.07* 2.03* 1.56* 1.84* 2.01*    CARDIAC    Recent Labs Lab 08/24/14 1736 08/25/14 08/25/14 0520  TROPONINI 0.07* 0.07* 0.06*   No results for input(s): PROBNP in the last 168 hours.   CHEMISTRY  Recent Labs Lab 08/18/14 1735  08/20/14 0450 08/24/14 0809 08/24/14 1551  08/24/14 1736 08/25/14 0520  NA  --   < > 135 131* 135 135 132*  K  --   < > 3.6 4.4 4.7 4.1 4.4  CL  --   < > 96* 94* 97* 98* 97*  CO2  --   < > 31 23 23 24 23   GLUCOSE  --   < > 135* 153* 149* 140* 178*  BUN  --   < > 53* 83* 30* 32* 34*  CREATININE  --   < > 4.09* 8.59* 4.87* 5.12* 5.62*  CALCIUM  --   < > 9.2 10.0 8.7* 8.6* 8.3*  MG 1.9  --   --   --  1.5*  --  1.8  PHOS  --   --  4.0 5.0* 3.1  --  4.4  < > = values in this interval not displayed. Estimated  Creatinine Clearance: 16.6 mL/min (by C-G formula based on Cr of 5.62).   LIVER  Recent Labs Lab 08/20/14 0450 08/21/14 0345 08/23/14 0349 08/24/14 0340 08/24/14 0809 08/24/14 1736 08/25/14 0520  AST  --  18  --   --   --  33 30  ALT  --  15*  --   --   --  22 22  ALKPHOS  --  173*  --   --   --  306* 193*  BILITOT  --  0.8  --   --   --  1.6* 1.1  PROT  --  6.1*  --   --   --  6.3* 5.6*  ALBUMIN 2.3* 2.3*  --   --  2.6* 2.4* 2.0*  INR 2.07*  --  2.03* 1.56*  --  1.84* 2.01*     INFECTIOUS  Recent Labs Lab 08/24/14 1734 08/24/14 1736 08/24/14 2145  LATICACIDVEN 2.2*  --  1.3  PROCALCITON  --  15.60  --      ENDOCRINE CBG (last 3)   Recent Labs  08/25/14 0648 08/25/14 1212 08/25/14 1522  GLUCAP 164* 113* 112*     IMAGING x48h Dg Abd 1 View  08/24/2014   CLINICAL DATA:  59 year old male with enteric tube placement.  EXAM: ABDOMEN - 1 VIEW  COMPARISON:  Radiograph dated 08/16/2014  FINDINGS: An enteric tubes noted extending into the epigastric area with tip to the right of the spine likely in the distal stomach or gastroduodenal junction. Air-filled loops of bowel noted in the pelvis. There is degenerative changes of the spine. Screws of the right hip arthroplasty are partially noted.  IMPRESSION: Enteric tube with tip in the distal stomach   Electronically Signed   By: Anner Crete M.D.   On: 08/24/2014 17:55   Dg Chest Port 1 View  08/24/2014   CLINICAL DATA:  Respiratory failure and status post intubation and placement of new central line.  EXAM: PORTABLE CHEST - 1 VIEW  COMPARISON:  Film earlier today at 1340 hours  FINDINGS: Endotracheal tube is been placed with the tip approximately 2 cm above the carina. In addition to the pre-existing left subclavian central line, a new left jugular central line has been placed with the tip located in the midline in the expected position of the brachiocephalic vein. No pneumothorax. Aeration of both lower lung zones has  improved since the prior study with some mild residual airspace opacity remaining at both bases. No edema, pneumothorax or significant pleural fluid identified. Stable cardiac enlargement.  IMPRESSION: Endotracheal tube tip approximately 2 cm above the  carina. Newly placed left jugular central line tip is in the brachiocephalic vein. Improved aeration of both lower lung zones since the prior study.   Electronically Signed   By: Aletta Edouard M.D.   On: 08/24/2014 17:55   Dg Chest Port 1 View  08/24/2014   CLINICAL DATA:  Right-sided chest pain, elevated white blood cell count.  EXAM: PORTABLE CHEST - 1 VIEW  COMPARISON:  Portable chest x-ray of August 22, 2014  FINDINGS: Positioning is more straight AP today. The lungs are adequately inflated. There remain increased interstitial lung markings at both bases. The cardiac silhouette remains enlarged. The central pulmonary vascularity is engorged and indistinct. There is a left subclavian venous catheter whose tip projects over the junction of the right and left brachiocephalic veins. The bony thorax is unremarkable.  IMPRESSION: 1. Persistent lower lobe increased interstitial markings which may reflect interstitial edema or less likely interstitial pneumonia. 2. Mild cardiomegaly and pulmonary vascular congestion consistent with CHF.   Electronically Signed   By: David  Martinique M.D.   On: 08/24/2014 13:58    ASSESSMENT / PLAN:  PULMONARY ETT 7/1 >> 08/16/14, 7/4 (self extubated and reintubated) >>08/21/14, 7/12 >>> A:  Acute hypoxic respiratory failure synergy flash pulmonary edema OSA/OHS - dx years ago per patient . Not used CPAP in years  P:   Continuing ventilator support  Plan for spontaneous breathing trial in a.m. Discussed tracheostomy placement with patient today. He is going to further   consider and we will readdress in the morning. Needs outpatient follow-up with sleep physician  CARDIOVASCULAR Left IJ CVC 7/12 >>  Left Subclavian CVC 7/01  >> 7/12 A:  Shock, suspect septic Asystolic cardiac arrest 7/1 (inital presentation) Acute on chronic systolic/diastolic CHF Severe cardiomyopathy AF RVR - resolved and now sinus rhythm Prolonged QTc H/O HTN, CAD  P: Continue amiodarone drip Continue monitoring on telemetry  Goal MAP >65 Continuing to wean vasopressor infusions  RENAL A:   ESRD on HD - H/O Noncompliance with HD - MWF Hypomagnesemia - resolved  P:   Nephrology following  Plan for hemodialysis tomorrow in ICU   GASTROINTESTINAL A:   Dysphagia  P:   Has failed swallow evals NPO for now Stress ulcer prophylaxis with Protonix IV  HEMATOLOGIC A:   Jehovah's witness - family previously confirmed 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 Anemia of chronic illness Coagulopathy secondary to Coumadin on admission  - resolved  P:  Continuing to trend daily hemoglobin with CBC Holding Coumadin  LIMIT draws when able [facility] witness) Currently on ferric gluconate DVT prophylaxis with heparin subcutaneous every 8 hours once INR <2.0 & SCDs   INFECTIOUS A:   Septic shock - unclear etiology. No diarrhea. No infiltrate on chest x-ray.  P:   Trending leukocytosis Discontinuing by mouth Flagyl & oral vancomycin given lack of diarrhea  BCx4 7/12 >>> Sputum 7/12 >>>  Abx: Ceftaz 7/12 >>> Abx: IV vanc 7/12 >>>  PCT algorithm  PRN tylenol, cooling blanket  ENDOCRINE A:   Diabetes mellitus type 2  P:   NovoLog sliding scale insulin with Accu-Cheks every 4 hours   NEUROLOGIC A:   L3 fracture (chronic) Acute anoxic encephalopathy - resolved   Acute metabolic encephalopathy in setting sepsis - Resolved   P:   Limiting sedating medications   FAMILY  - Updates: No family present.   I spent a total of 37 minutes of her times a day caring for this patient and reviewing the patient's electronic medical record.  Sonia Baller Ashok Cordia, M.D. [facility] Pager:  [phone] After 3pm or if no response,  call [phone]

## 2014-08-25 NOTE — Progress Notes (Addendum)
AT 0718 RT attempted wean CPAP 5 PS 5 patient was apneic. RT increased PS to 8, then to 10 patient remained apneic. Patient returned to full support.

## 2014-08-25 NOTE — Progress Notes (Signed)
eLink Physician-Brief Progress Note Patient Name: Daniel Whitney DOB: 1955-05-13 MRN: [id number]   Date of Service  08/25/2014  HPI/Events of Note  On amio gtt- no order  eICU Interventions  Order given     Intervention Category Intermediate Interventions: Arrhythmia - evaluation and management  Pierre Cumpton V. 08/25/2014, 1:16 AM

## 2014-08-25 NOTE — Progress Notes (Addendum)
I note pt's decline yesterday and is now intubated.  IP Rehab will follow along for progress. Danne Baxter will follow along in my absence over the next several days and can be reached at [phone].   Please call if questions.  [place] Admissions Coordinator Cell [phone] Office [phone]

## 2014-08-26 ENCOUNTER — Encounter (HOSPITAL_COMMUNITY): Payer: Medicare Other

## 2014-08-26 LAB — CBC WITH DIFFERENTIAL/PLATELET
BASOS PCT: 0 % (ref 0–1)
Basophils Absolute: 0.1 10*3/uL (ref 0.0–0.1)
Eosinophils Absolute: 0.4 10*3/uL (ref 0.0–0.7)
Eosinophils Relative: 2 % (ref 0–5)
HEMATOCRIT: 27.5 % — AB (ref 39.0–52.0)
Hemoglobin: 8.8 g/dL — ABNORMAL LOW (ref 13.0–17.0)
Lymphocytes Relative: 5 % — ABNORMAL LOW (ref 12–46)
Lymphs Abs: 1 10*3/uL (ref 0.7–4.0)
MCH: 29 pg (ref 26.0–34.0)
MCHC: 32 g/dL (ref 30.0–36.0)
MCV: 90.8 fL (ref 78.0–100.0)
MONOS PCT: 6 % (ref 3–12)
Monocytes Absolute: 1.2 10*3/uL — ABNORMAL HIGH (ref 0.1–1.0)
NEUTROS ABS: 16.9 10*3/uL — AB (ref 1.7–7.7)
Neutrophils Relative %: 87 % — ABNORMAL HIGH (ref 43–77)
PLATELETS: 233 10*3/uL (ref 150–400)
RBC: 3.03 MIL/uL — ABNORMAL LOW (ref 4.22–5.81)
RDW: 15.9 % — AB (ref 11.5–15.5)
WBC: 19.5 10*3/uL — ABNORMAL HIGH (ref 4.0–10.5)

## 2014-08-26 LAB — RENAL FUNCTION PANEL
ANION GAP: 13 (ref 5–15)
ANION GAP: 13 (ref 5–15)
Albumin: 1.9 g/dL — ABNORMAL LOW (ref 3.5–5.0)
Albumin: 2.2 g/dL — ABNORMAL LOW (ref 3.5–5.0)
BUN: 41 mg/dL — ABNORMAL HIGH (ref 6–20)
BUN: 24 mg/dL — ABNORMAL HIGH (ref 6–20)
CALCIUM: 8.8 mg/dL — AB (ref 8.9–10.3)
CHLORIDE: 97 mmol/L — AB (ref 101–111)
CO2: 21 mmol/L — AB (ref 22–32)
CO2: 22 mmol/L (ref 22–32)
CREATININE: 5.07 mg/dL — AB (ref 0.61–1.24)
Calcium: 8.6 mg/dL — ABNORMAL LOW (ref 8.9–10.3)
Chloride: 104 mmol/L (ref 101–111)
Creatinine, Ser: 7.36 mg/dL — ABNORMAL HIGH (ref 0.61–1.24)
GFR calc Af Amer: 8 mL/min — ABNORMAL LOW (ref >60)
GFR calc Af Amer: 13 mL/min — ABNORMAL LOW (ref >60)
GFR calc non Af Amer: 7 mL/min — ABNORMAL LOW (ref >60)
GFR calc non Af Amer: 11 mL/min — ABNORMAL LOW (ref >60)
GLUCOSE: 80 mg/dL (ref 65–99)
Glucose, Bld: 103 mg/dL — ABNORMAL HIGH (ref 65–99)
PHOSPHORUS: 3.6 mg/dL (ref 2.5–4.6)
POTASSIUM: 3.6 mmol/L (ref 3.5–5.1)
Phosphorus: 4.7 mg/dL — ABNORMAL HIGH (ref 2.5–4.6)
Potassium: 3.7 mmol/L (ref 3.5–5.1)
Sodium: 131 mmol/L — ABNORMAL LOW (ref 135–145)
Sodium: 139 mmol/L (ref 135–145)

## 2014-08-26 LAB — GLUCOSE, CAPILLARY
GLUCOSE-CAPILLARY: 93 mg/dL (ref 65–99)
Glucose-Capillary: 87 mg/dL (ref 65–99)
Glucose-Capillary: 69 mg/dL (ref 65–99)
Glucose-Capillary: 75 mg/dL (ref 65–99)
Glucose-Capillary: 91 mg/dL (ref 65–99)

## 2014-08-26 LAB — PROTIME-INR
INR: 2.58 — ABNORMAL HIGH (ref 0.00–1.49)
Prothrombin Time: 27.3 seconds — ABNORMAL HIGH (ref 11.6–15.2)

## 2014-08-26 LAB — MAGNESIUM
Magnesium: 1.7 mg/dL (ref 1.7–2.4)
Magnesium: 1.8 mg/dL (ref 1.7–2.4)

## 2014-08-26 MED ORDER — HEPARIN SODIUM (PORCINE) 1000 UNIT/ML DIALYSIS: 2000.0000 [IU] | INTRAMUSCULAR | Status: DC | PRN

## 2014-08-26 MED ORDER — LIDOCAINE HCL (PF) 1 % IJ SOLN: 5.0000 mL | INTRAMUSCULAR | Status: DC | PRN

## 2014-08-26 MED ORDER — SODIUM CHLORIDE 0.9 % IV SOLN: 100.0000 mL | INTRAVENOUS | Status: DC | PRN

## 2014-08-26 MED ORDER — NEPRO/CARBSTEADY PO LIQD: 237.0000 mL | ORAL | Status: DC | PRN

## 2014-08-26 MED ORDER — HEPARIN SODIUM (PORCINE) 1000 UNIT/ML DIALYSIS: 1000.0000 [IU] | INTRAMUSCULAR | Status: DC | PRN

## 2014-08-26 MED ORDER — POTASSIUM CHLORIDE 20 MEQ/15ML (10%) PO SOLN
20.0000 meq | Freq: Once | ORAL | Status: AC
  Administered 2014-08-26: 20 meq
  Filled 2014-08-26: qty 15

## 2014-08-26 MED ORDER — LIDOCAINE-PRILOCAINE 2.5-2.5 % EX CREA: 1.0000 "application " | TOPICAL_CREAM | CUTANEOUS | Status: DC | PRN

## 2014-08-26 MED ORDER — ALTEPLASE 2 MG IJ SOLR: 2.0000 mg | Freq: Once | INTRAMUSCULAR | Status: AC | PRN

## 2014-08-26 MED ORDER — PENTAFLUOROPROP-TETRAFLUOROETH EX AERO: 1.0000 "application " | INHALATION_SPRAY | CUTANEOUS | Status: DC | PRN

## 2014-08-26 MED ORDER — MAGNESIUM SULFATE IN D5W 10-5 MG/ML-% IV SOLN
1.0000 g | Freq: Once | INTRAVENOUS | Status: AC
  Administered 2014-08-26: 1 g via INTRAVENOUS
  Filled 2014-08-26: qty 100

## 2014-08-26 NOTE — Progress Notes (Signed)
Remaining 130cc of discontinued Fentanyl gtt waisted in sink and witnessed by Dustin Folks, RN

## 2014-08-26 NOTE — Care Management Note (Signed)
Case Management Note  Patient Details  Name: Darin Cappo MRN: [id number] Date of Birth: 11/04/1955  Subjective/Objective:     08-26-14 [facility] have offered a bed from when they were previously following.    Hypotensive, tachycardic, resp failure, intubated, on pressors.  ?? Need for [facility].  Does have Medicare days available for [facility] if progresses that direction.                 Action/Plan:   Expected Discharge Date:                  Expected Discharge Plan:  Long Term Acute Care (LTAC)  In-House Referral:     Discharge planning Services     Post Acute Care Choice:    Choice offered to:     DME Arranged:    DME Agency:     HH Arranged:    HH Agency:     Status of Service:  In process, will continue to follow  Medicare Important Message Given:  Yes-fourth notification given Date Medicare IM Given:    Medicare IM give by:    Date Additional Medicare IM Given:    Additional Medicare Important Message give by:     If discussed at [place] of Stay Meetings, dates discussed:    Additional Comments:  Vergie Living, RN 08/26/2014, 7:04 AM

## 2014-08-26 NOTE — Progress Notes (Signed)
[facility] Progress Note   Subjective: on vent, responsive. Temps and wBC coming down. Blood cx's negative so far. Off pressors. On HD.   Filed Vitals:   08/26/14 0830 08/26/14 0900 08/26/14 0930 08/26/14 1000  BP: 123/56 127/59 93/50 100/53  Pulse: 105 101 112 105  Temp:      TempSrc:      Resp: 23 18 20  49  Height:      Weight:      SpO2: 100% 100% 100% 100%   Exam: On vent, alert, responsive No jvd Chest basilar rales bilat RRR 2/6 sem, no RG Abd soft ntnd no mass or ascites 1+ LE and UE edema bilat Foley in place L arm AVF +bruit Neuro, alert, Ox 3  TTS [place] 4h  EDW 119kg will have lower EDW at discharge  LFA AVF Heparin 3600 450/800 Hect 4 , Venofer 50/wk, mircera 75 q2wks last 6/21      Assessment: 1. Septic shock - improving, temps down, off pressors. On empiric abx. Per CCM. 2. Afib on IV amio 3. Vol excess - new dry wt ~ 104kg 4. S/P cardiac arrest - rx w cooling protocol  5. Vol overload/ marked pulm edema - on admission, has resolved, new dry wt and euvolemic now 6. ESRD TTS HD 7. Anemia cont darbe 100/wk,+ weekly Fe on Tuesday Hgb 10.9 8. 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 DM 2 per primary 10. HTN not on any meds here, (coreg/ valsartan at home )   Plan - HD today, max UF as tolerated    Kelly Splinter MD  pager [phone]    cell [phone]  08/26/2014, 10:23 AM     Recent Labs Lab 08/24/14 1551 08/24/14 1736 08/25/14 0520 08/26/14 0444  NA 135 135 132* 131*  K 4.7 4.1 4.4 3.6  CL 97* 98* 97* 97*  CO2 23 24 23  21*  GLUCOSE 149* 140* 178* 80  BUN 30* 32* 34* 41*  CREATININE 4.87* 5.12* 5.62* 7.36*  CALCIUM 8.7* 8.6* 8.3* 8.6*  PHOS 3.1  --  4.4 4.7*    Recent Labs Lab 08/21/14 0345  08/24/14 1736 08/25/14 0520 08/26/14 0444  AST 18  --  33 30  --   ALT 15*  --  22 22  --   ALKPHOS 173*  --  306* 193*  --   BILITOT 0.8  --  1.6* 1.1  --   PROT 6.1*  --  6.3* 5.6*  --    ALBUMIN 2.3*  < > 2.4* 2.0* 1.9*  < > = values in this interval not displayed.  Recent Labs Lab 08/20/14 0450  08/24/14 0809 08/24/14 1551 08/25/14 0520 08/26/14 0443  WBC 11.4*  < > 26.4* 13.1* 34.2* 19.5*  NEUTROABS 8.4*  --  23.7*  --   --  16.9*  HGB 10.0*  < > 10.9* 11.1* 9.9* 8.8*  HCT 32.3*  < > 33.5* 35.1* 31.2* 27.5*  MCV 94.7  < > 91.8 94.9 95.1 90.8  PLT 199  < > 277 240 271 233  < > = values in this interval not displayed. . sodium chloride   Intravenous Once  . antiseptic oral rinse  7 mL Mouth Rinse QID  . cefTAZidime (FORTAZ)  IV  2 g Intravenous Q T,Th,Sa-HD  . chlorhexidine  15 mL Mouth Rinse BID  . darbepoetin (ARANESP) injection - DIALYSIS  100 mcg Intravenous Q Tue-HD  . fentaNYL (SUBLIMAZE) injection  50  mcg Intravenous Once  . ferric gluconate (FERRLECIT/NULECIT) IV  62.5 mg Intravenous Q Thu-HD  . insulin aspart  0-9 Units Subcutaneous 6 times per day  . multivitamin  1 tablet Oral QHS  . pantoprazole (PROTONIX) IV  40 mg Intravenous QHS  . sevelamer carbonate  800 mg Oral TID WC  . sodium chloride  10-40 mL Intracatheter Q12H  . vancomycin  1,000 mg Intravenous Q T,Th,Sa-HD   . sodium chloride 10 mL/hr at 08/26/14 0000  . amiodarone 30 mg/hr (08/25/14 2321)  . fentaNYL infusion INTRAVENOUS 100 mcg/hr (08/24/14 2030)  . norepinephrine (LEVOPHED) Adult infusion    . phenylephrine (NEO-SYNEPHRINE) Adult infusion Stopped (08/25/14 1800)   sodium chloride, sodium chloride, acetaminophen (TYLENOL) oral liquid 160 mg/5 mL, alteplase, feeding supplement (NEPRO CARB STEADY), fentaNYL, fentaNYL (SUBLIMAZE) injection, heparin, [START ON 08/27/2014] heparin, lidocaine (PF), lidocaine-prilocaine, pentafluoroprop-tetrafluoroeth, sodium chloride

## 2014-08-26 NOTE — Progress Notes (Signed)
Attempted to wean patient for the second time today on PSV/CPAP. Patient RR increased with low tidal volumes on 12/+5.  Patient returned to full support settings. Patient vitals stable and patient comfortable. RT will continue to monitor.

## 2014-08-26 NOTE — Progress Notes (Signed)
PULMONARY / CRITICAL CARE MEDICINE   Name: Daniel Whitney MRN: [id number] DOB: 18-Apr-1955    ADMISSION DATE:  08/13/2014 CONSULTATION DATE:  08/13/2014  REFERRING MD :  Dr. Lita Mains  CHIEF COMPLAINT: Asystole  INITIAL PRESENTATION:  59 year old male with [facility] WITNESs (will not take blood products) , ESRD on HD had missed his last 2 dialysis treatments. 7/01 early AM he developed acute onset SOB and then became unresponsive. EMS noted asystole and began CPR. Total downtime estimated at about 20 mins. In ED he had several runs of VT, but was otherwise was hemodynamically stable on ventilator. PCCM to admit.  STUDIES:  7/01 CT head: NAICP 7/01 EEG: This sedated EEG is abnormal due to moderate diffuse slowing and suppression of the background 7/01 TTE: LVEF 20% 7/1 echo EF 20%, grade 2 DD 7/11 CT head > no acute  SIG EVENTS: 7/1 arrest, cooling started 7/2- HD, rewarm 7/9 extubated 7/13 reintubated 7/13 septic shock requiring vasopressors & atrial fibrillation with RVR  SUBJECTIVE/OVERNIGHT/INTERVAL HX No events overnight. Patient tolerating hemodialysis. Weaned off of vasopressor support prior to hemodialysis. Patient denies any chest pain or difficulty breathing. Patient willing to undergo tracheostomy placement if medically necessary. Able to nod to questions. Patient did undergo spontaneous breathing trial this morning and failed.  VITAL SIGNS: Temp:  [98.5 F (36.9 C)-101.6 F (38.7 C)] 98.5 F (36.9 C) (07/14 1131) Pulse Rate:  [25-147] 106 (07/14 1130) Resp:  [7-35] 22 (07/14 1130) BP: (73-144)/(42-107) 117/42 mmHg (07/14 1130) SpO2:  [94 %-100 %] 99 % (07/14 1116) FiO2 (%):  [40 %] 40 % (07/14 1100) Weight:  [111.2 kg (245 lb 2.4 oz)-111.7 kg (246 lb 4.1 oz)] 111.2 kg (245 lb 2.4 oz) (07/14 0752) HEMODYNAMICS:   VENTILATOR SETTINGS: Vent Mode:  [-] PRVC FiO2 (%):  [40 %] 40 % Set Rate:  [16 bmp] 16 bmp Vt Set:  [510 mL] 510 mL PEEP:  [5 cmH20] 5 cmH20 Plateau  Pressure:  [19 cmH20-23 cmH20] 19 cmH20 INTAKE / OUTPUT:  Intake/Output Summary (Last 24 hours) at 08/26/14 1214 Last data filed at 08/26/14 1100  Gross per 24 hour  Intake 1384.98 ml  Output     54 ml  Net 1330.98 ml    PHYSICAL EXAMINATION: General: Obese. Currently being accessed for hemodialysis. Awake.  Neuro:  Patient able to communicate through writing and head nod. Following commands. Moving all 4 extremities equally. HEENT:  PERRL. no scleral icterus. Endotracheal tube remains in place. Cardiovascular:  Regular rate. Sinus rhythm on telemetry. No appreciable JVD. Lungs: Symmetric chest wall rise. Normal work of breathing on the ventilator. Abdomen:  Protuberant. Obese. Nontender. Ext:  No cyanosis or clubbing.  Integument:  Warm and dry. No rash on exposed skin. Left internal jugular central venous catheter in place.  LABS: PULMONARY  Recent Labs Lab 08/20/14 1224 08/24/14 1827 08/24/14 1905 08/25/14 0818  PHART 7.323*  --  7.363 7.359  PCO2ART 62.3*  --  47.0* 40.7  PO2ART 86.0  --  264.0* 109*  HCO3 32.3* 25.2* 26.2* 22.4  TCO2 34 27 27 23.6  O2SAT 95.0 83.0 100.0 97.6    CBC  Recent Labs Lab 08/24/14 1551 08/25/14 0520 08/26/14 0443  HGB 11.1* 9.9* 8.8*  HCT 35.1* 31.2* 27.5*  WBC 13.1* 34.2* 19.5*  PLT 240 271 233    COAGULATION  Recent Labs Lab 08/23/14 0349 08/24/14 0340 08/24/14 1736 08/25/14 0520 08/26/14 0443  INR 2.03* 1.56* 1.84* 2.01* 2.58*    CARDIAC  Recent Labs Lab 08/24/14 1736 08/25/14 08/25/14 0520  TROPONINI 0.07* 0.07* 0.06*   No results for input(s): PROBNP in the last 168 hours.   CHEMISTRY  Recent Labs Lab 08/20/14 0450 08/24/14 0809 08/24/14 1551 08/24/14 1736 08/25/14 0520 08/26/14 0444  NA 135 131* 135 135 132* 131*  K 3.6 4.4 4.7 4.1 4.4 3.6  CL 96* 94* 97* 98* 97* 97*  CO2 31 23 23 24 23  21*  GLUCOSE 135* 153* 149* 140* 178* 80  BUN 53* 83* 30* 32* 34* 41*  CREATININE 4.09* 8.59* 4.87*  5.12* 5.62* 7.36*  CALCIUM 9.2 10.0 8.7* 8.6* 8.3* 8.6*  MG  --   --  1.5*  --  1.8 1.7  PHOS 4.0 5.0* 3.1  --  4.4 4.7*   Estimated Creatinine Clearance: 13.1 mL/min (by C-G formula based on Cr of 7.36).   LIVER  Recent Labs Lab 08/21/14 0345 08/23/14 0349 08/24/14 0340 08/24/14 0809 08/24/14 1736 08/25/14 0520 08/26/14 0443 08/26/14 0444  AST 18  --   --   --  33 30  --   --   ALT 15*  --   --   --  22 22  --   --   ALKPHOS 173*  --   --   --  306* 193*  --   --   BILITOT 0.8  --   --   --  1.6* 1.1  --   --   PROT 6.1*  --   --   --  6.3* 5.6*  --   --   ALBUMIN 2.3*  --   --  2.6* 2.4* 2.0*  --  1.9*  INR  --  2.03* 1.56*  --  1.84* 2.01* 2.58*  --      INFECTIOUS  Recent Labs Lab 08/24/14 1734 08/24/14 1736 08/24/14 2145  LATICACIDVEN 2.2*  --  1.3  PROCALCITON  --  15.60  --      ENDOCRINE CBG (last 3)   Recent Labs  08/25/14 2024 08/25/14 2326 08/26/14 0317  GLUCAP 95 93 87     IMAGING x48h Dg Abd 1 View  08/24/2014   CLINICAL DATA:  59 year old male with enteric tube placement.  EXAM: ABDOMEN - 1 VIEW  COMPARISON:  Radiograph dated 08/16/2014  FINDINGS: An enteric tubes noted extending into the epigastric area with tip to the right of the spine likely in the distal stomach or gastroduodenal junction. Air-filled loops of bowel noted in the pelvis. There is degenerative changes of the spine. Screws of the right hip arthroplasty are partially noted.  IMPRESSION: Enteric tube with tip in the distal stomach   Electronically Signed   By: Anner Crete M.D.   On: 08/24/2014 17:55   Dg Chest Port 1 View  08/24/2014   CLINICAL DATA:  Respiratory failure and status post intubation and placement of new central line.  EXAM: PORTABLE CHEST - 1 VIEW  COMPARISON:  Film earlier today at 1340 hours  FINDINGS: Endotracheal tube is been placed with the tip approximately 2 cm above the carina. In addition to the pre-existing left subclavian central line, a new left  jugular central line has been placed with the tip located in the midline in the expected position of the brachiocephalic vein. No pneumothorax. Aeration of both lower lung zones has improved since the prior study with some mild residual airspace opacity remaining at both bases. No edema, pneumothorax or significant pleural fluid identified. Stable cardiac enlargement.  IMPRESSION: Endotracheal  tube tip approximately 2 cm above the carina. Newly placed left jugular central line tip is in the brachiocephalic vein. Improved aeration of both lower lung zones since the prior study.   Electronically Signed   By: Aletta Edouard M.D.   On: 08/24/2014 17:55   Dg Chest Port 1 View  08/24/2014   CLINICAL DATA:  Right-sided chest pain, elevated white blood cell count.  EXAM: PORTABLE CHEST - 1 VIEW  COMPARISON:  Portable chest x-ray of August 22, 2014  FINDINGS: Positioning is more straight AP today. The lungs are adequately inflated. There remain increased interstitial lung markings at both bases. The cardiac silhouette remains enlarged. The central pulmonary vascularity is engorged and indistinct. There is a left subclavian venous catheter whose tip projects over the junction of the right and left brachiocephalic veins. The bony thorax is unremarkable.  IMPRESSION: 1. Persistent lower lobe increased interstitial markings which may reflect interstitial edema or less likely interstitial pneumonia. 2. Mild cardiomegaly and pulmonary vascular congestion consistent with CHF.   Electronically Signed   By: David  Martinique M.D.   On: 08/24/2014 13:58    ASSESSMENT / PLAN:  PULMONARY ETT 7/1 >> 08/16/14, 7/4 (self extubated and reintubated) >>08/21/14, 7/12 >>> A:  Acute hypoxic respiratory failure synergy flash pulmonary edema OSA/OHS - dx years ago per patient . Not used CPAP in years  P:   Continuing ventilator support  Plan for spontaneous breathing trial after hemodialysis Patient acknowledges that he is willing to  undergo tracheostomy placement if necessary Needs outpatient follow-up with sleep physician  CARDIOVASCULAR Left IJ CVC 7/12 >>  Left Subclavian CVC 7/01 >> 7/12 A:  Shock, suspect septic - resolved Asystolic cardiac arrest 7/1 (inital presentation) Acute on chronic systolic/diastolic CHF Severe cardiomyopathy AF RVR - resolved and now sinus rhythm Prolonged QTc H/O HTN, CAD  P: Continue amiodarone drip and plan to transition to oral once extubated Continue monitoring on telemetry  Goal MAP >65 Weaned off of vasopressor support  RENAL A:   ESRD on HD - H/O Noncompliance with HD - MWF Hypomagnesemia - correcting with hemodialysis  P:   Nephrology following  Undergoing hemodialysis today Repeat serum magnesium and renal panel at 1400 hrs.  GASTROINTESTINAL A:   Dysphagia  P:   Has failed swallow evals NPO for now Stress ulcer prophylaxis with Protonix IV  HEMATOLOGIC A:   Jehovah's witness - family previously confirmed 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 Anemia of chronic illness Coagulopathy secondary to Coumadin - resolved  P:  Continuing to trend daily hemoglobin with CBC Holding Coumadin  LIMIT draws when able [facility] witness) Currently on ferric gluconate DVT prophylaxis with heparin subcutaneous every 8 hours once INR <2.0 & SCDs   INFECTIOUS A:   Septic shock - unclear etiology. No diarrhea. No infiltrate on chest x-ray.  P:   Trending leukocytosis Discontinuing PO Flagyl & oral vancomycin given lack of diarrhea Discontinued C. difficile precautions  BCx4 7/12 >>> Sputum 7/12 >>>  Abx: Ceftaz 7/12 >>> Abx: IV vanc 7/12 >>>  PCT algorithm  PRN tylenol, cooling blanket  ENDOCRINE A:   Diabetes mellitus type 2  P:   NovoLog sliding scale insulin with Accu-Cheks every 4 hours   NEUROLOGIC A:   L3 fracture (chronic) Acute anoxic encephalopathy - resolved   Acute metabolic encephalopathy in setting sepsis - Resolved   P:   Limiting sedating  medications   FAMILY  - Updates: No family present.   I spent a total of 36 minutes of  her times a day caring for this patient and reviewing the patient's electronic medical record.  Sonia Baller Ashok Cordia, M.D. [facility] Pager:  [phone] After 3pm or if no response, call [phone]

## 2014-08-26 NOTE — Progress Notes (Signed)
Nutrition Follow-up  DOCUMENTATION CODES:   Morbid obesity  INTERVENTION:    If unable to extubate patient within next 24 hours, recommend resume TF via OGT with Nepro at 10 ml/h with Prostat 60 ml 5 times per day to provide 1432 kcals, 169 gm protein, 182 ml free water daily.  NUTRITION DIAGNOSIS:   Inadequate oral intake related to inability to eat as evidenced by NPO status.  Ongoing  GOAL:   Provide needs based on ASPEN/SCCM guidelines  Unmet  MONITOR:   Vent status, Labs, Weight trends   ASSESSMENT:   The patient is a 59 y.o. year-old with hx HTN, DM, CAD/ MI, ESRD, afib and CHF. He was found unresponsive at home, EMS came and he was in asystole. CPR started by FD.  Labs reviewed: sodium low, phosphorus elevated.  Discussed patient in ICU rounds and with RN today. Patient was extubated on 7/9, but required re-intubation on 7/13. Continues to receive HD, tolerating well. May require a tracheostomy. Currently not receiving TF.  Patient is currently intubated on ventilator support MV: 14.2 L/min Temp (24hrs), Avg:99.5 F (37.5 C), Min:98.5 F (36.9 C), Max:101.6 F (38.7 C)  Propofol: none   Diet Order:  Diet NPO time specified  Skin:  Reviewed, no issues  Last BM:  7/14  Height:   Ht Readings from Last 1 Encounters:  08/22/14 5\' 9"  (1.753 m)    Weight:   Wt Readings from Last 1 Encounters:  08/26/14 233 lb 0.4 oz (105.7 kg)   08/20/14 241 lb 10 oz (109.6 kg)        Ideal Body Weight:  64.5 kg  Wt Readings from Last 10 Encounters:  08/26/14 233 lb 0.4 oz (105.7 kg)  08/04/14 265 lb (120.203 kg)  07/07/14 266 lb 1.9 oz (120.711 kg)  04/20/14 267 lb (121.11 kg)  01/31/14 251 lb 12.3 oz (114.2 kg)  11/11/13 266 lb 12 oz (120.997 kg)  11/05/13 271 lb (122.925 kg)  10/27/13 272 lb 6 oz (123.548 kg)  08/04/13 278 lb (126.1 kg)  07/14/13 278 lb (126.1 kg)    BMI:  Body mass index is 34.4 kg/(m^2).  Estimated Nutritional Needs:    Kcal:  [id number]  Protein:  >/= 161 grams  Fluid:  > 1.5 L  EDUCATION NEEDS:   No education needs identified at this time   Molli Barrows, [place], [place], [employer] Pager [phone] After Hours Pager [phone]

## 2014-08-27 LAB — GLUCOSE, CAPILLARY
GLUCOSE-CAPILLARY: 82 mg/dL (ref 65–99)
Glucose-Capillary: 102 mg/dL — ABNORMAL HIGH (ref 65–99)
Glucose-Capillary: 83 mg/dL (ref 65–99)
Glucose-Capillary: 98 mg/dL (ref 65–99)
Glucose-Capillary: 80 mg/dL (ref 65–99)
Glucose-Capillary: 106 mg/dL — ABNORMAL HIGH (ref 65–99)
Glucose-Capillary: 88 mg/dL (ref 65–99)
Glucose-Capillary: 100 mg/dL — ABNORMAL HIGH (ref 65–99)
Glucose-Capillary: 140 mg/dL — ABNORMAL HIGH (ref 65–99)

## 2014-08-27 LAB — CULTURE, RESPIRATORY

## 2014-08-27 LAB — RENAL FUNCTION PANEL
ALBUMIN: 2 g/dL — AB (ref 3.5–5.0)
Anion gap: 13 (ref 5–15)
BUN: 32 mg/dL — AB (ref 6–20)
CALCIUM: 9 mg/dL (ref 8.9–10.3)
CHLORIDE: 101 mmol/L (ref 101–111)
CO2: 22 mmol/L (ref 22–32)
CREATININE: 6.87 mg/dL — AB (ref 0.61–1.24)
GFR calc Af Amer: 9 mL/min — ABNORMAL LOW (ref >60)
GFR, EST NON AFRICAN AMERICAN: 8 mL/min — AB (ref >60)
Glucose, Bld: 98 mg/dL (ref 65–99)
Phosphorus: 4.7 mg/dL — ABNORMAL HIGH (ref 2.5–4.6)
Potassium: 4.2 mmol/L (ref 3.5–5.1)
Sodium: 136 mmol/L (ref 135–145)

## 2014-08-27 LAB — CBC WITH DIFFERENTIAL/PLATELET
Basophils Absolute: 0.1 10*3/uL (ref 0.0–0.1)
Basophils Relative: 1 % (ref 0–1)
EOS PCT: 3 % (ref 0–5)
Eosinophils Absolute: 0.5 10*3/uL (ref 0.0–0.7)
HCT: 28.8 % — ABNORMAL LOW (ref 39.0–52.0)
HEMOGLOBIN: 9.5 g/dL — AB (ref 13.0–17.0)
LYMPHS ABS: 1.4 10*3/uL (ref 0.7–4.0)
LYMPHS PCT: 9 % — AB (ref 12–46)
MCH: 29.4 pg (ref 26.0–34.0)
MCHC: 33 g/dL (ref 30.0–36.0)
MCV: 89.2 fL (ref 78.0–100.0)
MONO ABS: 1 10*3/uL (ref 0.1–1.0)
Monocytes Relative: 7 % (ref 3–12)
NEUTROS ABS: 11.9 10*3/uL — AB (ref 1.7–7.7)
NEUTROS PCT: 80 % — AB (ref 43–77)
Platelets: 251 10*3/uL (ref 150–400)
RBC: 3.23 MIL/uL — ABNORMAL LOW (ref 4.22–5.81)
RDW: 16 % — ABNORMAL HIGH (ref 11.5–15.5)
WBC: 14.8 10*3/uL — ABNORMAL HIGH (ref 4.0–10.5)

## 2014-08-27 LAB — URINE CULTURE

## 2014-08-27 LAB — PROTIME-INR
INR: 2.18 — ABNORMAL HIGH (ref 0.00–1.49)
PROTHROMBIN TIME: 24.1 s — AB (ref 11.6–15.2)

## 2014-08-27 LAB — MAGNESIUM: Magnesium: 2.2 mg/dL (ref 1.7–2.4)

## 2014-08-27 LAB — CULTURE, RESPIRATORY W GRAM STAIN

## 2014-08-27 MED ORDER — INSULIN ASPART 100 UNIT/ML ~~LOC~~ SOLN
0.0000 [IU] | Freq: Three times a day (TID) | SUBCUTANEOUS | Status: DC
  Administered 2014-08-28: 1 [IU] via SUBCUTANEOUS
  Administered 2014-08-28: 3 [IU] via SUBCUTANEOUS
  Administered 2014-08-29: 1 [IU] via SUBCUTANEOUS
  Administered 2014-08-29 – 2014-08-30 (×3): 2 [IU] via SUBCUTANEOUS
  Administered 2014-08-31: 3 [IU] via SUBCUTANEOUS

## 2014-08-27 MED ORDER — CEFTRIAXONE SODIUM IN DEXTROSE 40 MG/ML IV SOLN
2.0000 g | INTRAVENOUS | Status: DC
  Administered 2014-08-27 – 2014-08-30 (×3): 2 g via INTRAVENOUS
  Filled 2014-08-27 (×5): qty 50

## 2014-08-27 MED ORDER — CETYLPYRIDINIUM CHLORIDE 0.05 % MT LIQD
7.0000 mL | Freq: Two times a day (BID) | OROMUCOSAL | Status: DC
  Administered 2014-08-27 – 2014-08-30 (×7): 7 mL via OROMUCOSAL

## 2014-08-27 NOTE — Progress Notes (Signed)
Occupational Therapy Treatment Patient Details Name: Daniel Whitney MRN: [id number] DOB: 1955/11/06 Today's Date: 08/27/2014    History of present illness Pt is a 59 year old male with ESRD on HD who had missed his last 2 dialysis treatments. 7/01 early AM he developed acute onset SOB and then became unresponsive. EMS noted asystole and began CPR. Total downtime estimated at about 20 mins. In ED he had several runs of VT, but was otherwise was hemodynamically stable on ventilator. PCCM to admit. Pt intubated upon admission with periods of self exutbation and prompt reintubation. 7/9 exutabted by respiratory therapy. Most recent Chest x ray showed bibasilar atelectasis or PNA with pulmonary venous congestion. 7/12 pt with sepsis to ICU, vented and sedated   OT comments  This 59 yo male admitted with above presents to acute OT this session after a decreased in medical status from initial eval of 08/23/14. He is not back to his baseline of eval from 08/23/14 but is doing well considering all his lines and tubes and being in bed for 4 days now. Goals are still appropriate and as long as he can wean from vent feel CIR is appropriate otherwise will need LTACH.  Follow Up Recommendations  CIR    Equipment Recommendations   (TBD at next venue)       Precautions / Restrictions Precautions Precautions: Fall Restrictions Weight Bearing Restrictions: No       Mobility Bed Mobility Overal bed mobility: Needs Assistance;+2 for physical assistance Bed Mobility: Supine to Sit     Supine to sit: Min assist;+2 for physical assistance        Transfers Overall transfer level: Needs assistance Equipment used: 2 person hand held assist Transfers: Sit to/from Stand;Stand Pivot Transfers Sit to Stand: +2 physical assistance;Min assist Stand pivot transfers: Min assist;+2 physical assistance            Balance Overall balance assessment: Needs assistance Sitting-balance support: Feet  supported;Single extremity supported Sitting balance-Leahy Scale: Fair     Standing balance support: Bilateral upper extremity supported Standing balance-Leahy Scale: Poor                     ADL Overall ADL's : Needs assistance/impaired     Grooming: Wash/dry face;Wash/dry hands (supported sitting, setup/S)                   Toilet Transfer: Minimal assistance;+2 for physical assistance;Stand-pivot (bed>recliner next to bed)                    Vision                 Additional Comments: No change from baseline          Cognition   Behavior During Therapy: [facility] for tasks assessed/performed Overall Cognitive Status: Within Functional Limits for tasks assessed                                    Pertinent Vitals/ Pain       Pain Assessment: No/denies pain         Frequency Min 2X/week     Progress Toward Goals  OT Goals(current goals can now be found in the care plan section)  Progress towards OT goals:  (Pt not quite back to his baseline from original eval of 08/23/14, goals still appropriate)     Plan Discharge plan remains appropriate  End of Session     Activity Tolerance Patient tolerated treatment well   Patient Left in chair;with call bell/phone within reach   Nurse Communication Mobility status        Time: [id number] OT Time Calculation (min): 29 min  Charges: OT General Charges $OT Visit: 1 Procedure OT Treatments $Self Care/Home Management : 8-22 mins  Almon Register [phone] 08/27/2014, 8:39 AM

## 2014-08-27 NOTE — Progress Notes (Signed)
We will follow up Monday with pt's progress to assist with planning eventual dispo. [id number]

## 2014-08-27 NOTE — Procedures (Signed)
Extubation Procedure Note  Patient Details:   Name: Daniel Whitney DOB: 02-18-1955 MRN: [id number]   Airway Documentation:     Evaluation  O2 sats: stable throughout Complications: No apparent complications Patient did tolerate procedure well. Bilateral Breath Sounds: Clear Suctioning: Oral Yes, placed on 6L [place]   Gonzella Lex 08/27/2014, 9:53 AM

## 2014-08-27 NOTE — Progress Notes (Signed)
Physical Therapy Treatment Patient Details Name: Daniel Whitney MRN: [id number] DOB: 01-10-1956 Today's Date: 08/27/2014    History of Present Illness Pt is a 59 year old male with ESRD on HD who had missed his last 2 dialysis treatments. 7/01 early AM he developed acute onset SOB and then became unresponsive. EMS noted asystole and began CPR. Total downtime estimated at about 20 mins. In ED he had several runs of VT, but was otherwise was hemodynamically stable on ventilator. PCCM to admit. Pt intubated upon admission with periods of self exutbation and prompt reintubation. 7/9 exutabted by respiratory therapy. Most recent Chest x ray showed bibasilar atelectasis or PNA with pulmonary venous congestion. 7/12 pt with sepsis to ICU, vented and sedated    PT Comments    Pt with a decrease in medical status since PT eval, but continues to be motivated to participate in therapy and get OOB. +2 utilized this session for management of lines and balance, however still feel pt is doing well as he has not been OOB in 4 days. Hopeful for continued appropriateness for CIR at d/c. Will need definite +2 assist for gait training attempt if appropriate next session.   Follow Up Recommendations  CIR;Supervision/Assistance - 24 hour     Equipment Recommendations  None recommended by PT    Recommendations for Other Services Rehab consult     Precautions / Restrictions Precautions Precautions: Fall Precaution Comments: Intubated Restrictions Weight Bearing Restrictions: No    Mobility  Bed Mobility Overal bed mobility: Needs Assistance;+2 for physical assistance Bed Mobility: Supine to Sit     Supine to sit: Min assist;+2 for physical assistance     General bed mobility comments: Min assist with bed pad to complete scooting activity. Overall did well with trunk control and moving LE's to EOB.   Transfers Overall transfer level: Needs assistance Equipment used: 2 person hand held  assist Transfers: Sit to/from [facility] Sit to Stand: +2 physical assistance;Min assist Stand pivot transfers: Min assist;+2 physical assistance       General transfer comment: Braces LEs on chair for stability when standing. Wide stance and increased time required to gain/maintain balance. Hand-over-hand assist to reach for the arm rest of chair and initiate SPT to recliner.   Ambulation/Gait                 Stairs            Wheelchair Mobility    Modified Rankin (Stroke Patients Only)       Balance Overall balance assessment: Needs assistance Sitting-balance support: Feet supported;Single extremity supported Sitting balance-Leahy Scale: Fair     Standing balance support: Bilateral upper extremity supported;During functional activity Standing balance-Leahy Scale: Poor                      Cognition Arousal/Alertness: Awake/alert Behavior During Therapy: WFL for tasks assessed/performed Overall Cognitive Status: Within Functional Limits for tasks assessed                      Exercises      General Comments General comments (skin integrity, edema, etc.): Reviewed benefits of mobility, OOB, and keeping arms/legs moving while in bed to minimize loss of strength      Pertinent Vitals/Pain Pain Assessment: No/denies pain    Home Living Family/patient expects to be discharged to:: Private residence  Prior Function            PT Goals (current goals can now be found in the care plan section) Acute Rehab PT Goals Patient Stated Goal: Pt indicated he wanted to get out of the bed today PT Goal Formulation: With patient Time For Goal Achievement: 08/29/14 Potential to Achieve Goals: Good Progress towards PT goals: Progressing toward goals    Frequency  Min 3X/week    PT Plan Current plan remains appropriate    Co-evaluation PT/OT/SLP Co-Evaluation/Treatment: Yes Reason for  Co-Treatment: Complexity of the patient's impairments (multi-system involvement);For patient/therapist safety PT goals addressed during session: Mobility/safety with mobility;Balance       End of Session Equipment Utilized During Treatment: Gait belt Activity Tolerance: Patient limited by fatigue Patient left: in bed;with call bell/phone within reach;with bed alarm set     Time: 0800-0825 PT Time Calculation (min) (ACUTE ONLY): 25 min  Charges:  $Therapeutic Activity: 8-22 mins                    G Codes:      Rolinda Roan 2014/09/06, 9:50 AM   Rolinda Roan, PT, DPT Acute Rehabilitation Services Pager: [phone]

## 2014-08-27 NOTE — Progress Notes (Signed)
ANTIBIOTIC CONSULT NOTE - F/U  Pharmacy Consult for Vancomycin and Ceftazidime Indication: PNA  No Known Allergies  Patient Measurements: Height: 5\' 9"  (175.3 cm) Weight: 233 lb 0.4 oz (105.7 kg) IBW/kg (Calculated) : 70.7  Heparin dosing weight: ~92kg  Vital Signs: Temp: 98.3 F (36.8 C) (07/15 0857) Temp Source: Oral (07/15 0857) BP: 125/78 mmHg (07/15 1300) Pulse Rate: 85 (07/15 1300)   Intake/Output from previous day: 07/14 0701 - 07/15 0700 In: 776.2 [I.V.:756.2; NG/GT:20] Out: 5595 [Urine:65] Intake/Output from this shift: Total I/O In: 160.2 [I.V.:160.2] Out: -   Labs:  Recent Labs  08/25/14 0520 08/26/14 0443 08/26/14 0444 08/26/14 1400 08/27/14 0400  WBC 34.2* 19.5*  --   --  14.8*  HGB 9.9* 8.8*  --   --  9.5*  PLT 271 233  --   --  251  CREATININE 5.62*  --  7.36* 5.07* 6.87*   Estimated Creatinine Clearance: 13.7 mL/min (by C-G formula based on Cr of 6.87).  Microbiology: Recent Results (from the past 720 hour(s))  Culture, blood (routine x 2)     Status: None   Collection Time: 08/13/14  2:50 AM  Result Value Ref Range Status   Specimen Description BLOOD RIGHT HAND  Final   Special Requests BOTTLES DRAWN AEROBIC ONLY [place]  Final   Culture NO GROWTH 5 DAYS  Final   Report Status 08/18/2014 FINAL  Final  Culture, blood (routine x 2)     Status: None   Collection Time: 08/13/14  6:30 AM  Result Value Ref Range Status   Specimen Description BLOOD RIGHT HAND  Final   Special Requests BOTTLES DRAWN AEROBIC ONLY 5CC  Final   Culture NO GROWTH 5 DAYS  Final   Report Status 08/18/2014 FINAL  Final  MRSA PCR Screening     Status: None   Collection Time: 08/13/14  7:09 AM  Result Value Ref Range Status   MRSA by PCR NEGATIVE NEGATIVE Final    Comment:        The GeneXpert MRSA Assay (FDA approved for NASAL specimens only), is one component of a comprehensive MRSA colonization surveillance program. It is not intended to diagnose MRSA infection  nor to guide or monitor treatment for MRSA infections.   Culture, blood (routine x 2)     Status: None (Preliminary result)   Collection Time: 08/24/14 11:40 AM  Result Value Ref Range Status   Specimen Description BLOOD HEMODIALYSIS CATHETER  Final   Special Requests BOTTLES DRAWN AEROBIC AND ANAEROBIC 10CC  Final   Culture NO GROWTH 2 DAYS  Final   Report Status PENDING  Incomplete  Culture, blood (routine x 2)     Status: None (Preliminary result)   Collection Time: 08/24/14 12:10 PM  Result Value Ref Range Status   Specimen Description BLOOD HEMODIALYSIS CATHETER  Final   Special Requests BOTTLES DRAWN AEROBIC AND ANAEROBIC 10CC  Final   Culture NO GROWTH 2 DAYS  Final   Report Status PENDING  Incomplete  Culture, blood (x 2)     Status: None (Preliminary result)   Collection Time: 08/24/14  5:44 PM  Result Value Ref Range Status   Specimen Description BLOOD CENTRAL LINE  Final   Special Requests IN PEDIATRIC BOTTLE 4CC  Final   Culture NO GROWTH 2 DAYS  Final   Report Status PENDING  Incomplete  Culture, blood (x 2)     Status: None (Preliminary result)   Collection Time: 08/24/14  6:50 PM  Result  Value Ref Range Status   Specimen Description BLOOD RIGHT HAND  Final   Special Requests IN PEDIATRIC BOTTLE 3CC  Final   Culture NO GROWTH 2 DAYS  Final   Report Status PENDING  Incomplete  Culture, respiratory (NON-Expectorated)     Status: None   Collection Time: 08/24/14  6:53 PM  Result Value Ref Range Status   Specimen Description TRACHEAL ASPIRATE  Final   Special Requests NONE  Final   Gram Stain   Final    RARE WBC PRESENT, PREDOMINANTLY MONONUCLEAR RARE SQUAMOUS EPITHELIAL CELLS PRESENT NO ORGANISMS SEEN Performed at [facility]    Culture   Final    FEW ESCHERICHIA COLI Performed at [facility]    Report Status 08/27/2014 FINAL  Final   Organism ID, Bacteria ESCHERICHIA COLI  Final      Susceptibility   Escherichia coli - MIC*     AMPICILLIN <=2 SENSITIVE Sensitive     AMPICILLIN/SULBACTAM <=2 SENSITIVE Sensitive     CEFAZOLIN <=4 SENSITIVE Sensitive     CEFEPIME <=1 SENSITIVE Sensitive     CEFTAZIDIME <=1 SENSITIVE Sensitive     CEFTRIAXONE <=1 SENSITIVE Sensitive     CIPROFLOXACIN <=0.25 SENSITIVE Sensitive     GENTAMICIN <=1 SENSITIVE Sensitive     IMIPENEM <=0.25 SENSITIVE Sensitive     PIP/TAZO <=4 SENSITIVE Sensitive     TOBRAMYCIN <=1 SENSITIVE Sensitive     TRIMETH/SULFA <=20 SENSITIVE Sensitive     * FEW ESCHERICHIA COLI  Culture, Urine     Status: None   Collection Time: 08/25/14  6:41 AM  Result Value Ref Range Status   Specimen Description URINE, CATHETERIZED  Final   Special Requests NONE  Final   Culture 20,000 COLONIES/mL ESCHERICHIA COLI  Final   Report Status 08/27/2014 FINAL  Final   Organism ID, Bacteria ESCHERICHIA COLI  Final      Susceptibility   Escherichia coli - MIC*    AMPICILLIN <=2 SENSITIVE Sensitive     CEFAZOLIN <=4 SENSITIVE Sensitive     CEFTRIAXONE <=1 SENSITIVE Sensitive     CIPROFLOXACIN <=0.25 SENSITIVE Sensitive     GENTAMICIN <=1 SENSITIVE Sensitive     IMIPENEM <=0.25 SENSITIVE Sensitive     NITROFURANTOIN <=16 SENSITIVE Sensitive     TRIMETH/SULFA <=20 SENSITIVE Sensitive     AMPICILLIN/SULBACTAM <=2 SENSITIVE Sensitive     PIP/TAZO <=4 SENSITIVE Sensitive     * 20,000 COLONIES/mL ESCHERICHIA COLI   Assessment: 59 year old male with ESRD on HD had missed his last 2 dialysis treatments. Admitted 08/13/2014  early AM he developed acute onset SOB and then became unresponsive. EMS noted asystole and began CPR. Total estimated downtime estimated at about 20 mins. In ED he had several runs of VT  PMH: ESRD, Afib, CHF, DM, PAH, DM, OSA, CVA, MI, HTN, GERD, gout, HF   Infectious Disease: abx for HCAP/VAP, Tmax 101.6, WBC down to 14.8  7/12 Vanc>> 7/12 Ceftaz>>  7/1 BCx x2 - negative 7/12 BCX x2 - NGTD 7/12 BCx x2 - NGTD 7/13 TA - EColi Pan sensitive 7/13 UCx -  20K c/mL E.coli MRSA PCR Neg  Plan - Vanc 1gm IV q-HD TTS - Cefepime 2gm IV q-HD TTS - De-escalate to cefazolin for 1 week total of therapy for VAP?  Levester Fresh, PharmD, BCPS Clinical Pharmacist Pager [phone] 08/27/2014 2:01 PM

## 2014-08-27 NOTE — Progress Notes (Addendum)
PULMONARY / CRITICAL CARE MEDICINE   Name: Daniel Whitney MRN: [id number] DOB: 04-18-1955    ADMISSION DATE:  08/13/2014 CONSULTATION DATE:  08/13/2014  REFERRING MD :  Dr. Lita Mains  CHIEF COMPLAINT: Asystole  INITIAL PRESENTATION:  59 year old male with [facility] WITNESs (will not take blood products) , ESRD on HD had missed his last 2 dialysis treatments. 7/01 early AM he developed acute onset SOB and then became unresponsive. EMS noted asystole and began CPR. Total downtime estimated at about 20 mins. In ED he had several runs of VT, but was otherwise was hemodynamically stable on ventilator. PCCM to admit.  STUDIES:  7/01 CT head: NAICP 7/01 EEG: This sedated EEG is abnormal due to moderate diffuse slowing and suppression of the background 7/01 TTE: LVEF 20% 7/1 echo EF 20%, grade 2 DD 7/11 CT head > no acute  SIG EVENTS: 7/1 arrest, cooling started 7/2- HD, rewarm 7/9 extubated 7/13 reintubated 7/13 septic shock requiring vasopressors & atrial fibrillation with RVR  SUBJECTIVE/OVERNIGHT/INTERVAL HX No events overnight. Completed hemodialysis. Patient denies any pain or difficulty breathing at this time.  VITAL SIGNS: Temp:  [98.3 F (36.8 C)-99.3 F (37.4 C)] 98.6 F (37 C) (07/15 1525) Pulse Rate:  [28-110] 95 (07/15 1400) Resp:  [15-35] 31 (07/15 1400) BP: (83-140)/(34-126) 116/90 mmHg (07/15 1500) SpO2:  [29 %-100 %] 100 % (07/15 1400) FiO2 (%):  [40 %] 40 % (07/15 0851) HEMODYNAMICS:   VENTILATOR SETTINGS: Vent Mode:  [-] CPAP;PSV FiO2 (%):  [40 %] 40 % Set Rate:  [16 bmp] 16 bmp Vt Set:  [510 mL] 510 mL PEEP:  [0 cmH20-5 cmH20] 0 cmH20 Pressure Support:  [0 cmH20-5 cmH20] 0 cmH20 Plateau Pressure:  [16 cmH20-20 cmH20] 16 cmH20 INTAKE / OUTPUT:  Intake/Output Summary (Last 24 hours) at 08/27/14 1547 Last data filed at 08/27/14 1500  Gross per 24 hour  Intake 729.56 ml  Output     35 ml  Net 694.56 ml    PHYSICAL EXAMINATION: General: Obese. Sitting up  in a chair watching TV. No distress. Neuro:  Patient following commands. Grossly nonfocal.  HEENT:  PERRL. Endotracheal tube remains in place. Cardiovascular:  Regular rate. No appreciable JVD given the patient is sitting up. Lungs: Symmetric chest wall expansion. Normal work of breathing on the ventilator. Abdomen:  Protuberant. Obese. Nontender. Ext:  No cyanosis or clubbing.  Integument:  Warm and dry. No rash on exposed skin. Left internal jugular central venous catheter in place.  LABS: PULMONARY  Recent Labs Lab 08/24/14 1827 08/24/14 1905 08/25/14 0818  PHART  --  7.363 7.359  PCO2ART  --  47.0* 40.7  PO2ART  --  264.0* 109*  HCO3 25.2* 26.2* 22.4  TCO2 27 27 23.6  O2SAT 83.0 100.0 97.6    CBC  Recent Labs Lab 08/25/14 0520 08/26/14 0443 08/27/14 0400  HGB 9.9* 8.8* 9.5*  HCT 31.2* 27.5* 28.8*  WBC 34.2* 19.5* 14.8*  PLT 271 233 251    COAGULATION  Recent Labs Lab 08/24/14 0340 08/24/14 1736 08/25/14 0520 08/26/14 0443 08/27/14 0400  INR 1.56* 1.84* 2.01* 2.58* 2.18*    CARDIAC    Recent Labs Lab 08/24/14 1736 08/25/14 08/25/14 0520  TROPONINI 0.07* 0.07* 0.06*   No results for input(s): PROBNP in the last 168 hours.   CHEMISTRY  Recent Labs Lab 08/24/14 1551 08/24/14 1736 08/25/14 0520 08/26/14 0444 08/26/14 1400 08/27/14 0400  NA 135 135 132* 131* 139 136  K 4.7 4.1 4.4 3.6 3.7  4.2  CL 97* 98* 97* 97* 104 101  CO2 23 24 23  21* 22 22  GLUCOSE 149* 140* 178* 80 103* 98  BUN 30* 32* 34* 41* 24* 32*  CREATININE 4.87* 5.12* 5.62* 7.36* 5.07* 6.87*  CALCIUM 8.7* 8.6* 8.3* 8.6* 8.8* 9.0  MG 1.5*  --  1.8 1.7 1.8 2.2  PHOS 3.1  --  4.4 4.7* 3.6 4.7*   Estimated Creatinine Clearance: 13.7 mL/min (by C-G formula based on Cr of 6.87).   LIVER  Recent Labs Lab 08/21/14 0345  08/24/14 0340  08/24/14 1736 08/25/14 0520 08/26/14 0443 08/26/14 0444 08/26/14 1400 08/27/14 0400  AST 18  --   --   --  33 30  --   --   --   --    ALT 15*  --   --   --  22 22  --   --   --   --   ALKPHOS 173*  --   --   --  306* 193*  --   --   --   --   BILITOT 0.8  --   --   --  1.6* 1.1  --   --   --   --   PROT 6.1*  --   --   --  6.3* 5.6*  --   --   --   --   ALBUMIN 2.3*  --   --   < > 2.4* 2.0*  --  1.9* 2.2* 2.0*  INR  --   < > 1.56*  --  1.84* 2.01* 2.58*  --   --  2.18*  < > = values in this interval not displayed.   INFECTIOUS  Recent Labs Lab 08/24/14 1734 08/24/14 1736 08/24/14 2145  LATICACIDVEN 2.2*  --  1.3  PROCALCITON  --  15.60  --      ENDOCRINE CBG (last 3)   Recent Labs  08/27/14 0905 08/27/14 1222 08/27/14 1343  GLUCAP 106* 88 100*     IMAGING x48h No results found.  ASSESSMENT / PLAN:  PULMONARY ETT 7/1 >> 08/16/14, 7/4 (self extubated and reintubated) >>08/21/14, 7/12 >>> 7/15 A:  Acute hypoxic respiratory failure synergy flash pulmonary edema - now extubated OSA/OHS - dx years ago per patient . Not used CPAP in years  P:   S/P Extubation Patient acknowledges that he is willing to undergo tracheostomy placement if necessary Needs outpatient follow-up with sleep physician  CARDIOVASCULAR Left IJ CVC 7/12 >>  Left Subclavian CVC 7/01 >> 7/12 A:  Shock, suspect septic - resolved Asystolic cardiac arrest 7/1 (inital presentation) Acute on chronic systolic/diastolic CHF Severe cardiomyopathy AF RVR - resolved and now sinus rhythm Prolonged QTc H/O HTN, CAD  P: Continue amiodarone drip and plan to transition to oral once extubated Continue monitoring on telemetry  Goal MAP >65 Weaned off of vasopressor support  RENAL A:   ESRD on HD - H/O Noncompliance with HD - MWF Hypomagnesemia - resolved  P:   Nephrology following  Undergoing hemodialysis today  GASTROINTESTINAL A:   Dysphagia  P:   Has failed swallow evals Attempting swallow evaluation again Advance diet if passes swallow eval Stress ulcer prophylaxis with Protonix IV  HEMATOLOGIC A:   Jehovah's  witness - family previously confirmed 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 Anemia of chronic illness Coagulopathy secondary to Coumadin - resolved  P:  Continuing to trend daily hemoglobin with CBC Holding Coumadin  LIMIT draws when able [facility] witness) Currently on  ferric gluconate DVT prophylaxis with heparin subcutaneous every 8 hours once INR <2.0 & SCDs   INFECTIOUS A:   Septic shock - unclear etiology. Resolved.  P:   Trending leukocytosis  BCx4 7/12 >>> Sputum 7/12 >>> E.coli Respiratory Ctx 7/12 >>> E. coli  Abx: Ceftaz 7/12 >>>7/15 Abx: IV vanc 7/12 >>> Abx: IV Rocephin 7/15 >>>  ENDOCRINE A:   Diabetes mellitus type 2  P:   NovoLog sliding scale insulin with Accu-Cheks every 4 hours  Transition to qAC & HS once patient taking PO  NEUROLOGIC A:   L3 fracture (chronic) Acute anoxic encephalopathy - resolved   Acute metabolic encephalopathy in setting sepsis - Resolved   P:   Eliminating sedating medications.  FAMILY  - Updates: No family present.   I spent a total of 41 minutes of her times a day caring for this patient, planning his extubation, and reviewing the patient's electronic medical record.  Sonia Baller Ashok Cordia, M.D. [facility] Pager:  [phone] After 3pm or if no response, call [phone]

## 2014-08-27 NOTE — Progress Notes (Signed)
Assessment: 1. Septic shock - resolved 2. Afib on IV amio 3. Vol excess - new dry wt ~ 104kg 4. S/P cardiac arrest - rx w cooling protocol  5. Vol overload/ marked pulm edema/VDRF now extubated -  new dry wt and euvolemic now 6. ESRD TTS, HD in AM 7. Anemia cont darbe 100/wk,+ weekly Fe on Tuesday Hgb 10.9 8. 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 HTN meds on hold, (coreg/ valsartan at home )  Subjective: Interval History: Poor memory of preceding events  Objective: Vital signs in last 24 hours: Temp:  [98.3 F (36.8 C)-99.3 F (37.4 C)] 98.3 F (36.8 C) (07/15 0857) Pulse Rate:  [28-111] 95 (07/15 1200) Resp:  [15-34] 29 (07/15 1200) BP: (83-140)/(34-126) 116/73 mmHg (07/15 1200) SpO2:  [29 %-100 %] 100 % (07/15 1200) FiO2 (%):  [40 %] 40 % (07/15 0851) Weight change: -0.5 kg (-1 lb 1.6 oz)  Intake/Output from previous day: 07/14 0701 - 07/15 0700 In: 776.2 [I.V.:756.2; NG/GT:20] Out: 5595 [Urine:65] Intake/Output this shift: Total I/O In: 133.5 [I.V.:133.5] Out: -   General appearance: alert, cooperative and hoarse voice GI: obese and protuberant Extremities: edema neg  Nonfocal  Lab Results:  Recent Labs  08/26/14 0443 08/27/14 0400  WBC 19.5* 14.8*  HGB 8.8* 9.5*  HCT 27.5* 28.8*  PLT 233 251   BMET:  Recent Labs  08/26/14 1400 08/27/14 0400  NA 139 136  K 3.7 4.2  CL 104 101  CO2 22 22  GLUCOSE 103* 98  BUN 24* 32*  CREATININE 5.07* 6.87*  CALCIUM 8.8* 9.0   No results for input(s): PTH in the last 72 hours. Iron Studies: No results for input(s): IRON, TIBC, TRANSFERRIN, FERRITIN in the last 72 hours. Studies/Results: No results found.  Scheduled: . sodium chloride   Intravenous Once  . antiseptic oral rinse  7 mL Mouth Rinse QID  . cefTAZidime (FORTAZ)  IV  2 g Intravenous Q T,Th,Sa-HD  . chlorhexidine  15 mL Mouth Rinse BID  . darbepoetin (ARANESP) injection - DIALYSIS  100 mcg Intravenous Q Tue-HD  . fentaNYL  (SUBLIMAZE) injection  50 mcg Intravenous Once  . ferric gluconate (FERRLECIT/NULECIT) IV  62.5 mg Intravenous Q Thu-HD  . insulin aspart  0-9 Units Subcutaneous 6 times per day  . multivitamin  1 tablet Oral QHS  . pantoprazole (PROTONIX) IV  40 mg Intravenous QHS  . sevelamer carbonate  800 mg Oral TID WC  . sodium chloride  10-40 mL Intracatheter Q12H  . vancomycin  1,000 mg Intravenous Q T,Th,Sa-HD     LOS: 14 days   Daniel Whitney C 08/27/2014,12:51 PM

## 2014-08-28 DIAGNOSIS — J40 Bronchitis, not specified as acute or chronic: Secondary | ICD-10-CM

## 2014-08-28 DIAGNOSIS — I4891 Unspecified atrial fibrillation: Secondary | ICD-10-CM

## 2014-08-28 LAB — CBC WITH DIFFERENTIAL/PLATELET
Basophils Absolute: 0.1 10*3/uL (ref 0.0–0.1)
Basophils Relative: 1 % (ref 0–1)
EOS PCT: 7 % — AB (ref 0–5)
Eosinophils Absolute: 0.8 10*3/uL — ABNORMAL HIGH (ref 0.0–0.7)
HCT: 32.6 % — ABNORMAL LOW (ref 39.0–52.0)
Hemoglobin: 10.8 g/dL — ABNORMAL LOW (ref 13.0–17.0)
Lymphocytes Relative: 16 % (ref 12–46)
Lymphs Abs: 1.9 10*3/uL (ref 0.7–4.0)
MCH: 29.8 pg (ref 26.0–34.0)
MCHC: 33.1 g/dL (ref 30.0–36.0)
MCV: 89.8 fL (ref 78.0–100.0)
MONO ABS: 1.1 10*3/uL — AB (ref 0.1–1.0)
Monocytes Relative: 9 % (ref 3–12)
NEUTROS PCT: 67 % (ref 43–77)
Neutro Abs: 7.8 10*3/uL — ABNORMAL HIGH (ref 1.7–7.7)
PLATELETS: 291 10*3/uL (ref 150–400)
RBC: 3.63 MIL/uL — ABNORMAL LOW (ref 4.22–5.81)
RDW: 15.9 % — ABNORMAL HIGH (ref 11.5–15.5)
WBC: 11.7 10*3/uL — ABNORMAL HIGH (ref 4.0–10.5)

## 2014-08-28 LAB — RENAL FUNCTION PANEL
ALBUMIN: 2.3 g/dL — AB (ref 3.5–5.0)
ANION GAP: 12 (ref 5–15)
BUN: 43 mg/dL — ABNORMAL HIGH (ref 6–20)
CALCIUM: 9.5 mg/dL (ref 8.9–10.3)
CO2: 22 mmol/L (ref 22–32)
CREATININE: 8 mg/dL — AB (ref 0.61–1.24)
Chloride: 101 mmol/L (ref 101–111)
GFR, EST AFRICAN AMERICAN: 7 mL/min — AB (ref >60)
GFR, EST NON AFRICAN AMERICAN: 6 mL/min — AB (ref >60)
Glucose, Bld: 143 mg/dL — ABNORMAL HIGH (ref 65–99)
PHOSPHORUS: 3.5 mg/dL (ref 2.5–4.6)
Potassium: 4.3 mmol/L (ref 3.5–5.1)
Sodium: 135 mmol/L (ref 135–145)

## 2014-08-28 LAB — PROTIME-INR
INR: 2.04 — AB (ref 0.00–1.49)
Prothrombin Time: 22.9 seconds — ABNORMAL HIGH (ref 11.6–15.2)

## 2014-08-28 LAB — GLUCOSE, CAPILLARY
GLUCOSE-CAPILLARY: 111 mg/dL — AB (ref 65–99)
Glucose-Capillary: 127 mg/dL — ABNORMAL HIGH (ref 65–99)
Glucose-Capillary: 205 mg/dL — ABNORMAL HIGH (ref 65–99)

## 2014-08-28 LAB — MAGNESIUM: Magnesium: 2.3 mg/dL (ref 1.7–2.4)

## 2014-08-28 MED ORDER — AMIODARONE HCL 200 MG PO TABS
200.0000 mg | ORAL_TABLET | Freq: Every day | ORAL | Status: DC
  Administered 2014-08-28 – 2014-08-31 (×4): 200 mg via ORAL
  Filled 2014-08-28 (×4): qty 1

## 2014-08-28 MED ORDER — PANTOPRAZOLE SODIUM 40 MG PO TBEC
40.0000 mg | DELAYED_RELEASE_TABLET | Freq: Every day | ORAL | Status: DC
  Administered 2014-08-28 – 2014-08-30 (×3): 40 mg via ORAL
  Filled 2014-08-28 (×3): qty 1

## 2014-08-28 MED ORDER — WARFARIN - PHARMACIST DOSING INPATIENT
Freq: Every day | Status: DC
Start: 2014-08-28 — End: 2014-08-31

## 2014-08-28 MED ORDER — SODIUM CHLORIDE 0.9 % IV SOLN: 100.0000 mL | INTRAVENOUS | Status: DC | PRN

## 2014-08-28 MED ORDER — HEPARIN SODIUM (PORCINE) 1000 UNIT/ML DIALYSIS: 1000.0000 [IU] | INTRAMUSCULAR | Status: DC | PRN

## 2014-08-28 MED ORDER — PENTAFLUOROPROP-TETRAFLUOROETH EX AERO: 1.0000 "application " | INHALATION_SPRAY | CUTANEOUS | Status: DC | PRN

## 2014-08-28 MED ORDER — HEPARIN SODIUM (PORCINE) 1000 UNIT/ML DIALYSIS: 20.0000 [IU]/kg | INTRAMUSCULAR | Status: DC | PRN

## 2014-08-28 MED ORDER — SIMVASTATIN 20 MG PO TABS
20.0000 mg | ORAL_TABLET | Freq: Every day | ORAL | Status: DC
  Administered 2014-08-29 – 2014-08-31 (×4): 20 mg via ORAL
  Filled 2014-08-28 (×5): qty 1

## 2014-08-28 MED ORDER — ALTEPLASE 2 MG IJ SOLR
2.0000 mg | Freq: Once | INTRAMUSCULAR | Status: AC | PRN
  Filled 2014-08-28: qty 2

## 2014-08-28 MED ORDER — NEPRO/CARBSTEADY PO LIQD: 237.0000 mL | ORAL | Status: DC | PRN

## 2014-08-28 MED ORDER — CHOLESTYRAMINE 4 G PO PACK
4.0000 g | PACK | Freq: Two times a day (BID) | ORAL | Status: DC
  Administered 2014-08-29 – 2014-08-30 (×4): 4 g via ORAL
  Filled 2014-08-28 (×7): qty 1

## 2014-08-28 MED ORDER — LIDOCAINE-PRILOCAINE 2.5-2.5 % EX CREA: 1.0000 "application " | TOPICAL_CREAM | CUTANEOUS | Status: DC | PRN

## 2014-08-28 MED ORDER — LIDOCAINE HCL (PF) 1 % IJ SOLN: 5.0000 mL | INTRAMUSCULAR | Status: DC | PRN

## 2014-08-28 MED ORDER — WARFARIN SODIUM 2 MG PO TABS
2.0000 mg | ORAL_TABLET | Freq: Once | ORAL | Status: AC
  Filled 2014-08-28: qty 1

## 2014-08-28 NOTE — Progress Notes (Signed)
eLink Physician-Brief Progress Note Patient Name: Daniel Whitney DOB: 1955/07/05 MRN: [id number]   Date of Service  08/28/2014  HPI/Events of Note  Diarrhea.   eICU Interventions  Will order Questran 4 gm PO BID.     Intervention Category Minor Interventions: Routine modifications to care plan (e.g. PRN medications for pain, fever)  Sommer,Steven Eugene 08/28/2014, 10:29 PM

## 2014-08-28 NOTE — Progress Notes (Signed)
Assessment: 1. Septic shock - resolved 2. Afib on IV amio 3. Vol excess - new dry wt ~ 104kg 4. S/P cardiac arrest - rx w cooling protocol  5. Vol overload/ marked pulm edema/VDRF now extubated - new dry wt and euvolemic now 6. ESRD TTS, HD today 7. Anemia cont darbe 100/wk,+ weekly Fe on Tuesday Hgb 10.9 8. 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 HTN meds on hold, (coreg/ valsartan at home )  Subjective: Interval History: Better this AM  Objective: Vital signs in last 24 hours: Temp:  [98.1 F (36.7 C)-98.9 F (37.2 C)] 98.1 F (36.7 C) (07/16 0732) Pulse Rate:  [46-115] 86 (07/16 0900) Resp:  [16-38] 30 (07/16 0900) BP: (89-141)/(53-126) 134/75 mmHg (07/16 0900) SpO2:  [91 %-100 %] 99 % (07/16 0900) Weight change:   Intake/Output from previous day: 07/15 0701 - 07/16 0700 In: 1110.8 [P.O.:420; I.V.:640.8; IV Piggyback:50] Out: 37 [Urine:35; Stool:2] Intake/Output this shift: Total I/O In: 453.4 [P.O.:400; I.V.:53.4] Out: -   General appearance: alert and cooperative Resp: clear to auscultation bilaterally Chest wall: no tenderness Cardio: irregularly irregular rhythm Extremities: edema 1+  Lab Results:  Recent Labs  08/27/14 0400 08/28/14 0500  WBC 14.8* 11.7*  HGB 9.5* 10.8*  HCT 28.8* 32.6*  PLT 251 291   BMET:  Recent Labs  08/27/14 0400 08/28/14 0500  NA 136 135  K 4.2 4.3  CL 101 101  CO2 22 22  GLUCOSE 98 143*  BUN 32* 43*  CREATININE 6.87* 8.00*  CALCIUM 9.0 9.5   No results for input(s): PTH in the last 72 hours. Iron Studies: No results for input(s): IRON, TIBC, TRANSFERRIN, FERRITIN in the last 72 hours. Studies/Results: No results found.  Scheduled: . antiseptic oral rinse  7 mL Mouth Rinse BID  . cefTRIAXone (ROCEPHIN)  IV  2 g Intravenous Q24H  . darbepoetin (ARANESP) injection - DIALYSIS  100 mcg Intravenous Q Tue-HD  . ferric gluconate (FERRLECIT/NULECIT) IV  62.5 mg Intravenous Q Thu-HD  . insulin  aspart  0-9 Units Subcutaneous TID WC  . multivitamin  1 tablet Oral QHS  . pantoprazole  40 mg Oral QHS  . sevelamer carbonate  800 mg Oral TID WC  . sodium chloride  10-40 mL Intracatheter Q12H  . vancomycin  1,000 mg Intravenous Q T,Th,Sa-HD   Continuous: . sodium chloride 10 mL/hr at 08/28/14 0400  . amiodarone 30 mg/hr (08/28/14 0400)  . norepinephrine (LEVOPHED) Adult infusion Stopped (08/26/14 1918)      LOS: 15 days   Tawn Fitzner C 08/28/2014,10:29 AM

## 2014-08-28 NOTE — Progress Notes (Signed)
PULMONARY / CRITICAL CARE MEDICINE   Name: Daniel Whitney MRN: [id number] DOB: 1955/11/30    ADMISSION DATE:  08/13/2014 CONSULTATION DATE:  08/13/2014  REFERRING MD :  Dr. Lita Mains  CHIEF COMPLAINT: Asystole  INITIAL PRESENTATION:  59 year old male with [facility] WITNESs (will not take blood products) , ESRD on HD had missed his last 2 dialysis treatments. 7/01 early AM he developed acute onset SOB and then became unresponsive. EMS noted asystole and began CPR. Total downtime estimated at about 20 mins. In ED he had several runs of VT, but was otherwise was hemodynamically stable on ventilator. PCCM to admit.  STUDIES:  7/01 CT head: NAICP 7/01 EEG: This sedated EEG is abnormal due to moderate diffuse slowing and suppression of the background 7/01 TTE: LVEF 20% 7/1 echo EF 20%, grade 2 DD 7/11 CT head > no acute  SIG EVENTS: 7/1 arrest, cooling started 7/2- HD, rewarm 7/9 extubated 7/13 reintubated 7/13 septic shock requiring vasopressors & atrial fibrillation with RVR 7/16 to [place] better.  Denies chest pain.  VITAL SIGNS: Temp:  [98.1 F (36.7 C)-98.9 F (37.2 C)] 98.1 F (36.7 C) (07/16 0732) Pulse Rate:  [46-115] 90 (07/16 1000) Resp:  [16-38] 27 (07/16 1000) BP: (89-141)/(53-126) 136/83 mmHg (07/16 1000) SpO2:  [91 %-100 %] 100 % (07/16 1000) INTAKE / OUTPUT:  Intake/Output Summary (Last 24 hours) at 08/28/14 1045 Last data filed at 08/28/14 1000  Gross per 24 hour  Intake 1510.8 ml  Output     37 ml  Net 1473.8 ml    PHYSICAL EXAMINATION: General: sitting in chair Neuro: normal strength HEENT: Lt IJ site clean Cardiovascular: regular Lungs: no wheeze Abdomen: non tender Ext: no edema.  LABS: PULMONARY  Recent Labs Lab 08/24/14 1827 08/24/14 1905 08/25/14 0818  PHART  --  7.363 7.359  PCO2ART  --  47.0* 40.7  PO2ART  --  264.0* 109*  HCO3 25.2* 26.2* 22.4  TCO2 27 27 23.6  O2SAT 83.0 100.0 97.6    CBC  Recent Labs Lab  08/26/14 0443 08/27/14 0400 08/28/14 0500  HGB 8.8* 9.5* 10.8*  HCT 27.5* 28.8* 32.6*  WBC 19.5* 14.8* 11.7*  PLT 233 251 291    COAGULATION  Recent Labs Lab 08/24/14 1736 08/25/14 0520 08/26/14 0443 08/27/14 0400 08/28/14 0500  INR 1.84* 2.01* 2.58* 2.18* 2.04*    CARDIAC    Recent Labs Lab 08/24/14 1736 08/25/14 08/25/14 0520  TROPONINI 0.07* 0.07* 0.06*   No results for input(s): PROBNP in the last 168 hours.   CHEMISTRY  Recent Labs Lab 08/25/14 0520 08/26/14 0444 08/26/14 1400 08/27/14 0400 08/28/14 0500  NA 132* 131* 139 136 135  K 4.4 3.6 3.7 4.2 4.3  CL 97* 97* 104 101 101  CO2 23 21* 22 22 22   GLUCOSE 178* 80 103* 98 143*  BUN 34* 41* 24* 32* 43*  CREATININE 5.62* 7.36* 5.07* 6.87* 8.00*  CALCIUM 8.3* 8.6* 8.8* 9.0 9.5  MG 1.8 1.7 1.8 2.2 2.3  PHOS 4.4 4.7* 3.6 4.7* 3.5   Estimated Creatinine Clearance: 11.8 mL/min (by C-G formula based on Cr of 8).   LIVER  Recent Labs Lab 08/24/14 1736 08/25/14 0520 08/26/14 0443 08/26/14 0444 08/26/14 1400 08/27/14 0400 08/28/14 0500  AST 33 30  --   --   --   --   --   ALT 22 22  --   --   --   --   --   Arabella Merles  306* 193*  --   --   --   --   --   BILITOT 1.6* 1.1  --   --   --   --   --   PROT 6.3* 5.6*  --   --   --   --   --   ALBUMIN 2.4* 2.0*  --  1.9* 2.2* 2.0* 2.3*  INR 1.84* 2.01* 2.58*  --   --  2.18* 2.04*     INFECTIOUS  Recent Labs Lab 08/24/14 1734 08/24/14 1736 08/24/14 2145  LATICACIDVEN 2.2*  --  1.3  PROCALCITON  --  15.60  --      ENDOCRINE CBG (last 3)   Recent Labs  08/27/14 1522 08/27/14 2217 08/28/14 0733  GLUCAP 82 140* 127*     IMAGING x48h No results found.  ASSESSMENT / PLAN:  PULMONARY ETT 7/1 >> 08/16/14, 7/4 (self extubated and reintubated) >>08/21/14, 7/12 >>> 7/14 A:  Acute hypoxic respiratory failure synergy flash pulmonary edema. OSA/OHS - dx years ago per patient . Not used CPAP in years. P:   Oxygen to keep SpO2 > 92% Will need  sleep assessment as outpt  CARDIOVASCULAR Left IJ CVC 7/12 >>  Left Subclavian CVC 7/01 >> 7/12 A:  Shock, suspect septic - resolved. Asystolic cardiac arrest 7/1 (inital presentation). Acute on chronic systolic/diastolic CHF. Severe cardiomyopathy. AF RVR - resolved and now sinus rhythm. Prolonged QTc. H/O HTN, CAD. P: Change to po amiodarone Monitor on tele Hold outpt coreg, diovan Resume zocor  RENAL A:   ESRD on HD - H/O Noncompliance with HD - MWF. Hypomagnesemia - correcting with hemodialysis. P:   HD per renal  GASTROINTESTINAL A:   Dysphagia >> improved. P:   Carb modified diet  HEMATOLOGIC A:   Jehovah's witness - family previously confirmed 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. Anemia of chronic illness. Coagulopathy secondary to Coumadin - resolved. P:  Coumadin per pharmacy  INFECTIOUS A:   Septic shock 2nd to tracheobronchitis P:   Day 5/7 of Abx, currently on rocephin  BCx4 7/12 >>> Sputum 7/12 >>> E coli Urine 7/13 >> E coli  ENDOCRINE A:   Diabetes mellitus type 2. Hx of gout. P:   Hold allopurinol, januvia SSI  NEUROLOGIC A:   L3 fracture (chronic). Acute anoxic encephalopathy - resolved. Acute metabolic encephalopathy in setting sepsis - Resolved. P:   Monitor mental status  To telemetry 7/16 >> To [facility] 7/17 and PCCM off.  Chesley Mires, MD [facility] Pulmonary/Critical Care 08/28/2014, 10:56 AM Pager:  [phone] After 3pm call: [phone]

## 2014-08-28 NOTE — Progress Notes (Signed)
08/28/2014 12:27 PM  Report received. Room is ready for the patient.   [employer], RN-BC, RN3 [facility] [place] [address] Phone [phone]'

## 2014-08-28 NOTE — Progress Notes (Signed)
ANTICOAGULATION CONSULT NOTE - Initial Consult  Pharmacy Consult for warfarin Indication: atrial fibrillation  No Known Allergies  Patient Measurements: Height: 5\' 9"  (175.3 cm) Weight: 233 lb 0.4 oz (105.7 kg) IBW/kg (Calculated) : 70.7  Vital Signs: Temp: 98.6 F (37 C) (07/16 1134) Temp Source: Oral (07/16 1134) BP: 130/67 mmHg (07/16 1200) Pulse Rate: 95 (07/16 1200)  Labs:  Recent Labs  08/26/14 0443  08/26/14 1400 08/27/14 0400 08/28/14 0500  HGB 8.8*  --   --  9.5* 10.8*  HCT 27.5*  --   --  28.8* 32.6*  PLT 233  --   --  251 291  LABPROT 27.3*  --   --  24.1* 22.9*  INR 2.58*  --   --  2.18* 2.04*  CREATININE  --   < > 5.07* 6.87* 8.00*  < > = values in this interval not displayed.  Estimated Creatinine Clearance: 11.8 mL/min (by C-G formula based on Cr of 8).   Medical History: Past Medical History  Diagnosis Date  . Glaucoma   . Hypertension   . Hyperlipidemia   . Atrial fibrillation 12/30/2012  . CHF (congestive heart failure) 12/30/2012  . Diabetic retinopathy 12/30/2012  . Morbid obesity 12/30/2012  . Pulmonary hypertension 12/30/2012  . Myocardial infarction     "I've had ~ 3; last one was in ~ 10/2013" (01/30/2014)  . Diabetes mellitus with neuropathy   . OSA (obstructive sleep apnea) 12/30/2012    "lost weight; no longer needed CPAP; retested said I needed it; didn't followup cause I was feeling fine" (01/30/2014)  . GERD (gastroesophageal reflux disease)   . Stroke ~ 2005; ~ 2005    "they were mild; I didn't even notice I'd had them"; denies residual on 01/30/2014  . History of gout     "right big toe"  . Arthritis     "all over"  . Chronic lower back pain   . ESRD (end stage renal disease) on dialysis started in 2013    "MWF; [facility]; [address]" (01/30/2014)  . Refusal of blood transfusions as patient is Jehovah's Witness   . Anemia due to other cause 07/07/2014  . Allergic rhinitis 07/07/2014    Medications:  Scheduled:  .  amiodarone  200 mg Oral Daily  . antiseptic oral rinse  7 mL Mouth Rinse BID  . cefTRIAXone (ROCEPHIN)  IV  2 g Intravenous Q24H  . darbepoetin (ARANESP) injection - DIALYSIS  100 mcg Intravenous Q Tue-HD  . ferric gluconate (FERRLECIT/NULECIT) IV  62.5 mg Intravenous Q Thu-HD  . insulin aspart  0-9 Units Subcutaneous TID WC  . multivitamin  1 tablet Oral QHS  . pantoprazole  40 mg Oral QHS  . sevelamer carbonate  800 mg Oral TID WC  . simvastatin  20 mg Oral q1800  . sodium chloride  10-40 mL Intracatheter Q12H  . warfarin  2 mg Oral ONCE-1800  . Warfarin - Pharmacist Dosing Inpatient   Does not apply q1800    Assessment: 59 year old male with ESRD on HD had missed his last 2 dialysis treatments. Admitted 08/13/2014  early AM he developed acute onset SOB and then became unresponsive. EMS noted asystole and began CPR. Total estimated downtime estimated at about 20 mins. In ED he had several runs of VT  PMH: ESRD, Afib, CHF, DM, PAH, DM, OSA, CVA, MI, HTN, GERD, gout, HF  Anticoagulation/Heme: Afib on Coumadin PTA (4.5mg  Thurs, 3mg  AOD) >> held b/c in ICU INR 2.04  Goal  of Therapy:  INR 2-3 Monitor platelets by anticoagulation protocol: Yes   Plan:  - Warfarin 2 mg x 1 (will start lower since on amiodarone) - Daily INR - Monitor for s/sx of bleeding  Levester Fresh, PharmD, BCPS Clinical Pharmacist Pager [phone] 08/28/2014 12:27 PM

## 2014-08-28 NOTE — Progress Notes (Signed)
08/28/2014 1:33 PM  Transfer Note:   Traveling Method: Via wheelchair with RN  Transferring Unit: 2M01 Mental Orientation: Alert and oriented X4 Telemetry: Per MD orders Assessment: Completed Skin: Warm, dry and intact. IV: Clean, dry and intact. Pain: Stated none at this time Tubes: Left arm fistula  Safety Measures: Safety Fall Prevention Plan has been given, discussed and signed Admission: Completed 6 [place] Orientation: Patient has been orientated to the room, unit and staff.  Family: None at the bedside at this time Transferring Incident:: Patient stable for a telemetry- med/surg floor per MD orders.   Orders have been reviewed and implemented. Will continue to monitor the patient. Call light has been placed within reach and bed alarm has been activated.   [employer], RN-BC, RN3 Phone number: [phone]

## 2014-08-29 DIAGNOSIS — I469 Cardiac arrest, cause unspecified: Principal | ICD-10-CM

## 2014-08-29 DIAGNOSIS — J80 Acute respiratory distress syndrome: Secondary | ICD-10-CM

## 2014-08-29 LAB — CBC
HCT: 33.3 % — ABNORMAL LOW (ref 39.0–52.0)
Hemoglobin: 10.9 g/dL — ABNORMAL LOW (ref 13.0–17.0)
MCH: 29.9 pg (ref 26.0–34.0)
MCHC: 32.7 g/dL (ref 30.0–36.0)
MCV: 91.2 fL (ref 78.0–100.0)
PLATELETS: 253 10*3/uL (ref 150–400)
RBC: 3.65 MIL/uL — AB (ref 4.22–5.81)
RDW: 15.9 % — ABNORMAL HIGH (ref 11.5–15.5)
WBC: 7 10*3/uL (ref 4.0–10.5)

## 2014-08-29 LAB — CULTURE, BLOOD (ROUTINE X 2)
CULTURE: NO GROWTH
CULTURE: NO GROWTH
Culture: NO GROWTH
Culture: NO GROWTH

## 2014-08-29 LAB — GLUCOSE, CAPILLARY
GLUCOSE-CAPILLARY: 198 mg/dL — AB (ref 65–99)
Glucose-Capillary: 139 mg/dL — ABNORMAL HIGH (ref 65–99)
Glucose-Capillary: 147 mg/dL — ABNORMAL HIGH (ref 65–99)
Glucose-Capillary: 191 mg/dL — ABNORMAL HIGH (ref 65–99)

## 2014-08-29 LAB — PROTIME-INR
INR: 1.64 — ABNORMAL HIGH (ref 0.00–1.49)
PROTHROMBIN TIME: 19.5 s — AB (ref 11.6–15.2)

## 2014-08-29 LAB — RENAL FUNCTION PANEL
ANION GAP: 11 (ref 5–15)
Albumin: 2.3 g/dL — ABNORMAL LOW (ref 3.5–5.0)
BUN: 20 mg/dL (ref 6–20)
CHLORIDE: 95 mmol/L — AB (ref 101–111)
CO2: 28 mmol/L (ref 22–32)
Calcium: 8.9 mg/dL (ref 8.9–10.3)
Creatinine, Ser: 5.44 mg/dL — ABNORMAL HIGH (ref 0.61–1.24)
GFR calc non Af Amer: 10 mL/min — ABNORMAL LOW (ref >60)
GFR, EST AFRICAN AMERICAN: 12 mL/min — AB (ref >60)
GLUCOSE: 149 mg/dL — AB (ref 65–99)
POTASSIUM: 3.7 mmol/L (ref 3.5–5.1)
Phosphorus: 3.2 mg/dL (ref 2.5–4.6)
SODIUM: 134 mmol/L — AB (ref 135–145)

## 2014-08-29 MED ORDER — PRO-STAT SUGAR FREE PO LIQD
30.0000 mL | Freq: Two times a day (BID) | ORAL | Status: DC
  Administered 2014-08-29 – 2014-08-30 (×4): 30 mL via ORAL
  Filled 2014-08-29 (×8): qty 30

## 2014-08-29 MED ORDER — ACETAMINOPHEN 500 MG PO TABS
1000.0000 mg | ORAL_TABLET | Freq: Four times a day (QID) | ORAL | Status: DC | PRN
  Administered 2014-08-29 – 2014-08-30 (×2): 1000 mg via ORAL
  Filled 2014-08-29 (×2): qty 2

## 2014-08-29 MED ORDER — NEPRO/CARBSTEADY PO LIQD
237.0000 mL | Freq: Two times a day (BID) | ORAL | Status: DC
  Administered 2014-08-29 – 2014-08-30 (×3): 237 mL via ORAL

## 2014-08-29 MED ORDER — WARFARIN SODIUM 3 MG PO TABS
3.0000 mg | ORAL_TABLET | Freq: Once | ORAL | Status: AC
  Administered 2014-08-29: 3 mg via ORAL
  Filled 2014-08-29: qty 1

## 2014-08-29 NOTE — Progress Notes (Signed)
ANTICOAGULATION CONSULT NOTE - Follow up  Pharmacy Consult for warfarin Indication: atrial fibrillation  No Known Allergies  Patient Measurements: Height: 5\' 9"  (175.3 cm) Weight: 227 lb 4.8 oz (103.103 kg) IBW/kg (Calculated) : 70.7  Vital Signs: Temp: 98.2 F (36.8 C) (07/17 0932) Temp Source: Oral (07/17 0932) BP: 94/67 mmHg (07/17 0932) Pulse Rate: 97 (07/17 0932)  Labs:  Recent Labs  08/27/14 0400 08/28/14 0500 08/29/14 0618  HGB 9.5* 10.8* 10.9*  HCT 28.8* 32.6* 33.3*  PLT 251 291 253  LABPROT 24.1* 22.9* 19.5*  INR 2.18* 2.04* 1.64*  CREATININE 6.87* 8.00* 5.44*    Estimated Creatinine Clearance: 17.1 mL/min (by C-G formula based on Cr of 5.44).   Medical History: Past Medical History  Diagnosis Date  . Glaucoma   . Hypertension   . Hyperlipidemia   . Atrial fibrillation 12/30/2012  . CHF (congestive heart failure) 12/30/2012  . Diabetic retinopathy 12/30/2012  . Morbid obesity 12/30/2012  . Pulmonary hypertension 12/30/2012  . Myocardial infarction     "I've had ~ 3; last one was in ~ 10/2013" (01/30/2014)  . Diabetes mellitus with neuropathy   . OSA (obstructive sleep apnea) 12/30/2012    "lost weight; no longer needed CPAP; retested said I needed it; didn't followup cause I was feeling fine" (01/30/2014)  . GERD (gastroesophageal reflux disease)   . Stroke ~ 2005; ~ 2005    "they were mild; I didn't even notice I'd had them"; denies residual on 01/30/2014  . History of gout     "right big toe"  . Arthritis     "all over"  . Chronic lower back pain   . ESRD (end stage renal disease) on dialysis started in 2013    "MWF; [facility]; [address]" (01/30/2014)  . Refusal of blood transfusions as patient is Jehovah's Witness   . Anemia due to other cause 07/07/2014  . Allergic rhinitis 07/07/2014    Medications:  Scheduled:  . amiodarone  200 mg Oral Daily  . antiseptic oral rinse  7 mL Mouth Rinse BID  . cefTRIAXone (ROCEPHIN)  IV  2 g  Intravenous Q24H  . cholestyramine  4 g Oral BID  . darbepoetin (ARANESP) injection - DIALYSIS  100 mcg Intravenous Q Tue-HD  . feeding supplement (NEPRO CARB STEADY)  237 mL Oral BID BM  . feeding supplement (PRO-STAT SUGAR FREE 64)  30 mL Oral BID  . ferric gluconate (FERRLECIT/NULECIT) IV  62.5 mg Intravenous Q Thu-HD  . insulin aspart  0-9 Units Subcutaneous TID WC  . multivitamin  1 tablet Oral QHS  . pantoprazole  40 mg Oral QHS  . sevelamer carbonate  800 mg Oral TID WC  . simvastatin  20 mg Oral q1800  . sodium chloride  10-40 mL Intracatheter Q12H  . warfarin  2 mg Oral ONCE-1800  . Warfarin - Pharmacist Dosing Inpatient   Does not apply q1800    Assessment: 59 year old male with ESRD on HD had missed his last 2 dialysis treatments. Admitted 08/13/2014  early AM he developed acute onset SOB and then became unresponsive. EMS noted asystole and began CPR. Total estimated downtime estimated at about 20 mins. In ED he had several runs of VT  PMH: ESRD, Afib, CHF, DM, PAH, DM, OSA, CVA, MI, HTN, GERD, gout, HF  Anticoagulation/Heme: Afib on Coumadin PTA (4.5mg  Thurs, 3mg  AOD) >> held b/c in ICU. He was anticoagulated with IV heparin infusion from 7/3-7/11/16. Coumadin previously resumed on 7/10 to 7/12. Coumadin  last given on 08/23/14. Now pharmacy consulted to resume Coumadin on 08/28/14.  INR today =1.64 decreased from 2.04 (INR trend since 08/24/14:  1.84 >2.01>2.58>2.18>2.04>1.64 today); coumadin just resumed yesterday, although per EPIC review coumadin dose was not charted as given yesterday 7/16.   Amiodarone was started on 7/14. On 7/16 the  IV amiodarone changed to oral amiodaron 200 mg daily. Amiodarone likely to increase hypothrombinemic effect of coumadin. No bleeding noted.    Goal of Therapy:  INR 2-3 Monitor platelets by anticoagulation protocol: Yes   Plan:  - Warfarin 3 mg x 1 - Daily INR - Monitor for s/sx of bleeding  Nicole Cella, RPh Clinical Pharmacist Pager:  [phone] 08/29/2014 11:30 AM

## 2014-08-29 NOTE — Progress Notes (Signed)
Patient ID: Daniel Whitney, male   DOB: 04/03/1955, 59 y.o.   MRN: [id number]  [facility] PROGRESS NOTE  Daniel Whitney [id number] DOB: 07/02/1955 DOA: 08/13/2014 PCP: Cathlean Cower, MD   Brief narrative:    59 year old male, [facility] WITNESs (will not take blood products) , ESRD on HD had missed his last 2 dialysis treatments. 7/01 early AM he developed acute onset SOB and then became unresponsive. EMS noted asystole and began CPR. Total downtime estimated at about 20 mins. In ED he had several runs of VT, but was otherwise hemodynamically stable on ventilator. PCCM admitted   STUDIES:  7/01 echo EF 20%, grade 2 DD  SIG EVENTS: 7/01 arrest, cooling started 7/02 HD, rewarm 7/09 extubated 7/13 reintubated 7/13 septic shock requiring vasopressors & atrial fibrillation with RVR 7/16 to Tele  Assessment/Plan:   Acute hypoxic respiratory failure with flash pulmonary edema, acute on chronic combined CHF - in pt with known OSA/OHS and non compliance with CPAP - ETT 7/1 >> 08/16/14, 7/4 (self extubated and reintubated) >>08/21/14, 7/12 >>> 7/14 - respiratory status stable this AM - provide xxygen to keep SpO2 > 92% - Will need sleep assessment as outpt  Septic shock secondary to tracheobronchitis, cultures positive for E. 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  Anemia of chronic illness, ESRD - [employer]hov[employer]'s witness - no signs of  active bleeding   DM type II with complications of nephropathy  - SSI - hold Januvia for now   Acute anoxic encephalopathy, metabolic and from sepsis and cardiac arrest  - secondary to the above - resolved.  Obesity - Body mass index is 33.55 kg/(m^2).   DVT prophylaxis - on Coumadin   Code Status: Full.  Family Communication:  plan of care discussed with the patient Disposition Plan: Home when stable.   IV access:  Peripheral IV  Procedures and diagnostic studies:    Dg Thoracic Spine W/swimmers  07/31/2014   CLINICAL DATA:  Pain following fall  EXAM: THORACIC SPINE - 2 VIEW + SWIMMERS  COMPARISON:  Chest radiograph Jun 18, 2013  FINDINGS: Frontal, lateral, and swimmer's views obtained. There is no fracture or spondylolisthesis. There is mild disc space narrowing at several levels. There are multiple anterior and right lateral osteophytes. No erosive change.  IMPRESSION: Areas of osteoarthritic change.  No fracture or spondylolisthesis.   Electronically Signed   By: Lowella Grip III M.D.   On: 07/31/2014 20:27   Dg Lumbar Spine Complete  07/31/2014   CLINICAL DATA:  59 year old male who fell yesterday. Thoracolumbar pain. Initial encounter.  EXAM: LUMBAR SPINE - COMPLETE 4+ VIEW  COMPARISON:  Thoracic series from today reported separately.  FINDINGS: Normal lumbar segmentation. Compression deformity of the superior endplate of L3 which has an indistinct appearance favoring acute injury. Straightening of lumbar lordosis. Other low lumbar vertebrae appear intact ;  chronic or congenital wedging of L1 and L2 suspected.  Superimposed disc space loss and bulky endplate osteophytosis at L4-L5. No pars fracture. Sacral ala and SI joints within normal limits. Right total hip arthroplasty partially visible.  IMPRESSION: 1. Acute mild L3 compression fracture suspected. If specific therapy such as vertebroplasty is desired, Lumbar MRI or Nuclear Medicine Whole-body Bone Scan would best confirm.  2. Lower lumbar disc and endplate degeneration.   Electronically Signed   By: Genevie Ann M.D.   On: 07/31/2014 20:35   Dg Abd 1 View  08/24/2014   CLINICAL DATA:  59 year old male with enteric tube placement.  EXAM: ABDOMEN - 1 VIEW  COMPARISON:  Radiograph dated 08/16/2014  FINDINGS: An enteric tubes noted extending into the epigastric area with tip to the right of the spine likely in the distal stomach or gastroduodenal junction. Air-filled loops of bowel noted in the pelvis. There is degenerative changes of the spine. Screws of the right hip arthroplasty are partially noted.  IMPRESSION: Enteric tube with tip in the distal stomach   Electronically Signed   By: Anner Crete M.D.   On: 08/24/2014 17:55   Ct Head Wo Contrast  08/23/2014   CLINICAL DATA:  Left arm and left leg weakness. Missed the last 2 dialysis treatments. In the morning of 08/13/2014 he developed acute onset shortness of breath and became unresponsive affiliated EMS began CPR. Total Downtime estimated at 20 minutes.  EXAM: CT HEAD WITHOUT CONTRAST  TECHNIQUE: Contiguous axial images were obtained from the base of the skull through the vertex without intravenous contrast.  COMPARISON:  08/13/2014  FINDINGS: Diffuse cerebral atrophy. Mild ventricular dilatation consistent with central atrophy. Low-attenuation changes in the deep white matter consistent with small vessel ischemia. No mass effect or midline shift. No abnormal extra-axial fluid collections. Gray-white matter junctions are distinct. Basal cisterns are not effaced. No evidence of acute intracranial hemorrhage. No depressed skull fractures. Postoperative changes in the paranasal sinuses. Mucosal thickening in the maxillary antra. Mastoid air cells are not opacified. Ectatic and calcified intracranial internal carotid arteries.  IMPRESSION: No acute intracranial abnormalities. Chronic atrophy and small vessel ischemic changes. Vascular atherosclerotic changes.   Electronically  Signed   By: Lucienne Capers M.D.   On: 08/23/2014 01:26   Ct Head Wo Contrast  08/13/2014   CLINICAL DATA:  Initial evaluation for possible seizure  EXAM: CT HEAD WITHOUT CONTRAST  TECHNIQUE: Contiguous axial images were obtained from the base of the skull through the vertex without intravenous contrast.  COMPARISON:  Prior study from 07/28/2013  FINDINGS: Mild atrophy again noted. Patchy hypodensity within the periventricular and deep white matter both cerebral hemispheres most consistent with chronic small vessel ischemic disease, similar to previous. Prominent atheromatous calcifications present within the carotid siphons and distal vertebral arteries.  No acute large vessel territory infarct. No intracranial hemorrhage. No mass lesion, midline shift, or mass effect. No extra-axial fluid collection  No hydrocephalus.  No extra-axial fluid collection.  No acute abnormality about the orbits.  Scattered mucosal thickening present throughout the paranasal sinuses with sparing of the frontal sinuses. Fluid present within the nasopharynx. Endotracheal tube partially visualized. No mastoid effusion.  Calvarium intact.  IMPRESSION: 1. No acute intracranial process. 2. Moderate chronic microvascular ischemic disease, similar to previous.   Electronically Signed   By: Jeannine Boga M.D.   On: 08/13/2014 04:51   Dg Chest Port 1 View  08/24/2014   CLINICAL DATA:  Respiratory failure and status post intubation and  placement of new central line.  EXAM: PORTABLE CHEST - 1 VIEW  COMPARISON:  Film earlier today at 1340 hours  FINDINGS: Endotracheal tube is been placed with the tip approximately 2 cm above the carina. In addition to the pre-existing left subclavian central line, a new left jugular central line has been placed with the tip located in the midline in the expected position of the brachiocephalic vein. No pneumothorax. Aeration of both lower lung zones has improved since the prior study with some mild  residual airspace opacity remaining at both bases. No edema, pneumothorax or significant pleural fluid identified. Stable cardiac enlargement.  IMPRESSION: Endotracheal tube tip approximately 2 cm above the carina. Newly placed left jugular central line tip is in the brachiocephalic vein. Improved aeration of both lower lung zones since the prior study.   Electronically Signed   By: Aletta Edouard M.D.   On: 08/24/2014 17:55   Dg Chest Port 1 View  08/24/2014   CLINICAL DATA:  Right-sided chest pain, elevated white blood cell count.  EXAM: PORTABLE CHEST - 1 VIEW  COMPARISON:  Portable chest x-ray of August 22, 2014  FINDINGS: Positioning is more straight AP today. The lungs are adequately inflated. There remain increased interstitial lung markings at both bases. The cardiac silhouette remains enlarged. The central pulmonary vascularity is engorged and indistinct. There is a left subclavian venous catheter whose tip projects over the junction of the right and left brachiocephalic veins. The bony thorax is unremarkable.  IMPRESSION: 1. Persistent lower lobe increased interstitial markings which may reflect interstitial edema or less likely interstitial pneumonia. 2. Mild cardiomegaly and pulmonary vascular congestion consistent with CHF.   Electronically Signed   By: David  Martinique M.D.   On: 08/24/2014 13:58   Dg Chest Port 1 View  08/22/2014   CLINICAL DATA:  Adult respiratory distress syndrome. Recent cardiac arrest and respiratory failure. Recent extubation.  EXAM: PORTABLE CHEST - 1 VIEW  COMPARISON:  08/20/2014  FINDINGS: Endotracheal tube and nasogastric tube have been removed. Left subclavian central venous catheter remains in appropriate position. No pneumothorax visualized.  Decreased bibasilar atelectasis is seen compared to previous study. Cardiomegaly and diffuse pulmonary interstitial prominence remain stable. No evidence of pulmonary consolidation.  IMPRESSION: Decreased bibasilar atelectasis.   Stable cardiomegaly and diffuse pulmonary interstitial prominence.   Electronically Signed   By: Earle Gell M.D.   On: 08/22/2014 07:51   Dg Chest Port 1 View  08/20/2014   CLINICAL DATA:  CHF  EXAM: PORTABLE CHEST - 1 VIEW  COMPARISON:  08/19/2014  FINDINGS: The endotracheal tube tip remains just below the clavicular heads. The orogastric tube crosses the diaphragm at least. Stable left subclavian central line, tip at the upper SVC.  No change in cardiomegaly and vascular pedicle widening. Mediastinal contours are distorted by rightward rotation.  Poor inflation with persistent indistinct somewhat streaky bibasilar opacities. Pulmonary venous congestion.  IMPRESSION: 1. Stable positioning of tubes and central line. 2. Unchanged bibasilar atelectasis or pneumonia. 3. Stable cardiopericardial enlargement and pulmonary venous congestion.   Electronically Signed   By: Monte Fantasia M.D.   On: 08/20/2014 06:15   Dg Chest Port 1 View  08/19/2014   CLINICAL DATA:  Respiratory failure.  EXAM: PORTABLE CHEST - 1 VIEW  COMPARISON:  08/17/2014.  FINDINGS: Endotracheal tube, left subclavian line, and NG tube in good anatomic position. Cardiomegaly with partial clearing of bilateral pulmonary infiltrates and pleural effusions. These findings consistent with improving congestive heart failure. No pneumothorax.  IMPRESSION: 1.  Lines and tubes in stable position.  2. Cardiomegaly with improvement of bilateral pulmonary infiltrates and pleural effusions suggesting partial clearing of congestive heart failure. Basilar pneumonia cannot be excluded.   Electronically Signed   By: [place]   On: 08/19/2014 07:30   Dg Chest Port 1 View  08/17/2014   CLINICAL DATA:  Cardiac arrest  EXAM: PORTABLE CHEST - 1 VIEW  COMPARISON:  08/16/2014  FINDINGS: The endotracheal tube is in good position, with tip between the clavicular heads and carina. The orogastric tube enters the stomach at least. Stable left subclavian central  line, tip near the upper SVC.  Worsening opacification of the lower chest compatible with airspace disease. There may be superimposed layering fluid. No air leak or pulmonary edema in the apical lungs.  Stable cardiomegaly.  IMPRESSION: 1. Stable positioning of tubes and central line. 2. Progression of extensive airspace disease, potentially aspiration pneumonia given the history.   Electronically Signed   By: Monte Fantasia M.D.   On: 08/17/2014 06:00   Dg Chest Port 1 View  08/16/2014   CLINICAL DATA:  Hypoxia.  OG tube placement.  Re-intubation.  EXAM: PORTABLE CHEST - 1 VIEW  COMPARISON:  08/15/2014  FINDINGS: Endotracheal tube 2.6 cm above the carina. NG tube is in the stomach. Left central line tip is in the SVC, unchanged. There is cardiomegaly. Low lung volumes with diffuse bilateral airspace opacities. Possible small effusions. No acute bony abnormality.  IMPRESSION: Low lung volumes with diffuse bilateral airspace disease.  Endotracheal tube 2.6 cm above the carina.   Electronically Signed   By: Rolm Baptise M.D.   On: 08/16/2014 10:54   Dg Chest Port 1 View  08/15/2014   CLINICAL DATA:  Shortness of breath, unresponsive.  Post CPR.  ARDS.  EXAM: PORTABLE CHEST - 1 VIEW  COMPARISON:  08/14/2014  FINDINGS: Support devices are stable. Cardiomegaly. Bilateral airspace opacities are noted, slightly improved on the left since prior study. No change on the right. Possible small bilateral effusions. No acute bony abnormality.  IMPRESSION: Diffuse bilateral airspace disease with some improvement on the left since prior study.   Electronically Signed   By: Rolm Baptise M.D.   On: 08/15/2014 07:41   Dg Chest Port 1 View  08/14/2014   CLINICAL DATA:  Cardiac arrest.  Now 1 caudally Ing protocol.  EXAM: PORTABLE CHEST - 1 VIEW  COMPARISON:  08/13/2014  FINDINGS: Endotracheal tube is 3.8 cm above the carina. The left subclavian central line extends into the SVC. The nasogastric tube extends into the stomach.  Parenchymal lung opacities are present bilaterally with slight interval improvement in the right lung.  IMPRESSION: Support equipment appears satisfactorily positioned.  Slight improvement in right lung opacities, now with better aeration. Multifocal opacities persist, particularly in the left lung.   Electronically Signed   By: Andreas Newport M.D.   On: 08/14/2014 05:35   Dg Chest Port 1 View  08/13/2014   CLINICAL DATA:  Central line placement.  EXAM: PORTABLE CHEST - 1 VIEW  COMPARISON:  Radiographs 08/13/2014.  FINDINGS: 1024 hr. The endotracheal tube remains low, approximately 2.2 cm above the carina. There is a new left subclavian central venous catheter projecting to the mid SVC level. Nasogastric tube projects below the diaphragm, tip not visualized. There has been significant worsening of bilateral airspace opacities, worse on the right. There is some associated volume loss. The heart is enlarged. No pneumothorax or large pleural effusion identified.  IMPRESSION: Central line placement as described without complicating pneumothorax. The additional support system is unchanged; the endotracheal tube remains low.  Significant interval worsening of bilateral airspace opacities worrisome for aspiration.   Electronically Signed   By: Richardean Sale M.D.   On: 08/13/2014 10:41   Dg Chest Portable 1 View  08/13/2014   CLINICAL DATA:  Endotracheal intubation  EXAM: PORTABLE CHEST - 1 VIEW  COMPARISON:  08/13/2014 at 01:31  FINDINGS: Endotracheal tube is 2.5 cm above the carina. Nasogastric tube extends into the stomach. Linear left base opacities persist without significant interval change. Right lung is grossly clear. There is no large effusion. There is no pneumothorax.  IMPRESSION: Support equipment appears satisfactorily positioned.  No significant interval change in the linear left base opacities.   Electronically Signed   By: Andreas Newport M.D.   On: 08/13/2014 02:03   Dg Chest Port 1  View  08/13/2014   CLINICAL DATA:  Status post CPR status post intubation  EXAM: PORTABLE CHEST - 1 VIEW  COMPARISON:  01/29/2014  FINDINGS: Cardiac shadow is enlarged. An endotracheal tube is now seen at the level of the aortic knob in satisfactory position. A nasogastric catheter is noted extending into the distal esophagus. Its tip is not visualized on this exam. The lungs are well aerated. Slight increased density is noted in the left base although this may be in part due to overlying extrinsic artifact. No acute bony abnormality is seen.  IMPRESSION: Tubes and lines as described above.  Possible early left basilar infiltrate.   Electronically Signed   By: Inez Catalina M.D.   On: 08/13/2014 01:41   Dg Knee Complete 4 Views Left  07/31/2014   CLINICAL DATA:  Trip and fall injury last night. Landed on right side. Generalized left knee pain and bilateral hip pain. Difficulty bearing weight on the left knee.  EXAM: LEFT KNEE - COMPLETE 4+ VIEW  COMPARISON:  None.  FINDINGS: Prominent tricompartment degenerative changes in the left knee with bone on bone appearance in all 3 compartments, greatest laterally. Associated hypertrophic osteophytes. No evidence of acute fracture or dislocation. No focal bone lesion or bone destruction. No significant effusion. Extensive vascular calcifications.  IMPRESSION: Prominent tricompartment degenerative changes in the left knee. No acute bony abnormalities.   Electronically Signed   By: Lucienne Capers M.D.   On: 07/31/2014 22:21   Dg Abd Portable 1v  08/16/2014   CLINICAL DATA:  OG tube placement  EXAM: PORTABLE ABDOMEN - 1 VIEW  COMPARISON:  08/13/2014  FINDINGS: OG tube tip is in the proximal to mid stomach. Nonobstructive bowel gas pattern.  IMPRESSION: Enteric tube tip in the proximal to mid stomach.   Electronically Signed   By: Rolm Baptise M.D.   On: 08/16/2014 10:56   Dg Abd Portable 1v  08/13/2014   CLINICAL DATA:  Encounter for orogastric tube placement.  EXAM:  PORTABLE ABDOMEN - 1 VIEW  COMPARISON:  July 31, 2014.  FINDINGS: Distal tip of orogastric tube is seen in the expected position of the distal stomach or proximal duodenum. No abnormal bowel gas pattern is noted. Bilateral diffuse lung opacities are noted.  IMPRESSION: Distal tip of orogastric tube is seen in the expected position of the distal stomach or proximal duodenum. No evidence of bowel obstruction or ileus is noted.   Electronically Signed   By: Marijo Conception, M.D.   On: 08/13/2014 10:39   Dg Hips Bilat With Pelvis 3-4 Views  07/31/2014  CLINICAL DATA:  Bilateral hip pain after slip and fall injury.  EXAM: BILATERAL HIP (WITH PELVIS) 3-4 VIEWS  COMPARISON:  None.  FINDINGS: Views of both hips are obtained.  The right hip demonstrates previous right total hip arthroplasty using non cemented femoral component. Components appear well seated. No evidence of acute fracture or dislocation.  Left hip demonstrates mild degenerative changes with superior acetabular joint space narrowing and sclerosis and osteophytes on both the femoral and acetabular surfaces. No evidence of acute fracture or dislocation.  Visualized pelvis is unremarkable although the entire pelvis is not included. Vascular calcifications.  IMPRESSION: The right total hip arthroplasty. Components appear well seated. Degenerative changes in the left hip. No acute fractures identified.   Electronically Signed   By: Lucienne Capers M.D.   On: 07/31/2014 22:22    Medical Consultants:  Nephrology   Other Consultants:  PT  IAnti-Infectives:   Rocephin   Faye Ramsay, MD  [place] Pager [phone]  If 7PM-7AM, please contact night-coverage [email or web address] Password [facility] 08/29/2014, 12:57 PM   LOS: 16 days   HPI/Subjective: No events overnight.   Objective: Filed Vitals:   08/28/14 2020 08/29/14 0459 08/29/14 0932 08/29/14 1042  BP: 93/57 101/82 94/67   Pulse: 95 144 97   Temp: 98.2 F (36.8 C) 98.5 F (36.9 C) 98.2 F  (36.8 C)   TempSrc: Oral Oral Oral   Resp: 18 17 18    Height:      Weight: 107.366 kg (236 lb 11.2 oz)   103.103 kg (227 lb 4.8 oz)  SpO2: 97% 97% 98%     Intake/Output Summary (Last 24 hours) at 08/29/14 1257 Last data filed at 08/29/14 0900  Gross per 24 hour  Intake    840 ml  Output      0 ml  Net    840 ml    Exam:   General:  Pt is alert, follows commands appropriately, not in acute distress  Cardiovascular: Irregular rate and rhythm, no rubs, no gallops  Respiratory: Clear to auscultation bilaterally, no wheezing, no crackles, no rhonchi  Abdomen: Soft, non tender, non distended, bowel sounds present, no guarding  Extremities: pulses DP and PT palpable bilaterally  Neuro: Grossly nonfocal  Data Reviewed: Basic Metabolic Panel:  Recent Labs Lab 08/25/14 0520 08/26/14 0444 08/26/14 1400 08/27/14 0400 08/28/14 0500 08/29/14 0618  NA 132* 131* 139 136 135 134*  K 4.4 3.6 3.7 4.2 4.3 3.7  CL 97* 97* 104 101 101 95*  CO2 23 21* 22 22 22 28   GLUCOSE 178* 80 103* 98 143* 149*  BUN 34* 41* 24* 32* 43* 20  CREATININE 5.62* 7.36* 5.07* 6.87* 8.00* 5.44*  CALCIUM 8.3* 8.6* 8.8* 9.0 9.5 8.9  MG 1.8 1.7 1.8 2.2 2.3  --   PHOS 4.4 4.7* 3.6 4.7* 3.5 3.2   Liver Function Tests:  Recent Labs Lab 08/24/14 1736 08/25/14 0520 08/26/14 0444 08/26/14 1400 08/27/14 0400 08/28/14 0500 08/29/14 0618  AST 33 30  --   --   --   --   --   ALT 22 22  --   --   --   --   --   ALKPHOS 306* 193*  --   --   --   --   --   BILITOT 1.6* 1.1  --   --   --   --   --   PROT 6.3* 5.6*  --   --   --   --   --  ALBUMIN 2.4* 2.0* 1.9* 2.2* 2.0* 2.3* 2.3*   CBC:  Recent Labs Lab 08/24/14 0809  08/25/14 0520 08/26/14 0443 08/27/14 0400 08/28/14 0500 08/29/14 0618  WBC 26.4*  < > 34.2* 19.5* 14.8* 11.7* 7.0  NEUTROABS 23.7*  --   --  16.9* 11.9* 7.8*  --   HGB 10.9*  < > 9.9* 8.8* 9.5* 10.8* 10.9*  HCT 33.5*  < > 31.2* 27.5* 28.8* 32.6* 33.3*  MCV 91.8  < > 95.1 90.8  89.2 89.8 91.2  PLT 277  < > 271 233 251 291 253  < > = values in this interval not displayed. Cardiac Enzymes:  Recent Labs Lab 08/24/14 1736 08/25/14 08/25/14 0520  TROPONINI 0.07* 0.07* 0.06*   CBG:  Recent Labs Lab 08/28/14 0733 08/28/14 1135 08/28/14 1946 08/29/14 0729 08/29/14 1140  GLUCAP 127* 205* 111* 139* 198*    Recent Results (from the past 240 hour(s))  Culture, blood (routine x 2)     Status: None   Collection Time: 08/24/14 11:40 AM  Result Value Ref Range Status   Specimen Description BLOOD HEMODIALYSIS CATHETER  Final   Special Requests BOTTLES DRAWN AEROBIC AND ANAEROBIC 10CC  Final   Culture NO GROWTH 5 DAYS  Final   Report Status 08/29/2014 FINAL  Final  Culture, blood (routine x 2)     Status: None   Collection Time: 08/24/14 12:10 PM  Result Value Ref Range Status   Specimen Description BLOOD HEMODIALYSIS CATHETER  Final   Special Requests BOTTLES DRAWN AEROBIC AND ANAEROBIC 10CC  Final   Culture NO GROWTH 5 DAYS  Final   Report Status 08/29/2014 FINAL  Final  Culture, blood (x 2)     Status: None   Collection Time: 08/24/14  5:44 PM  Result Value Ref Range Status   Specimen Description BLOOD CENTRAL LINE  Final   Special Requests IN PEDIATRIC BOTTLE 4CC  Final   Culture NO GROWTH 5 DAYS  Final   Report Status 08/29/2014 FINAL  Final  Culture, blood (x 2)     Status: None   Collection Time: 08/24/14  6:50 PM  Result Value Ref Range Status   Specimen Description BLOOD RIGHT HAND  Final   Special Requests IN PEDIATRIC BOTTLE 3CC  Final   Culture NO GROWTH 5 DAYS  Final   Report Status 08/29/2014 FINAL  Final  Culture, respiratory (NON-Expectorated)     Status: None   Collection Time: 08/24/14  6:53 PM  Result Value Ref Range Status   Specimen Description TRACHEAL ASPIRATE  Final   Special Requests NONE  Final   Gram Stain   Final    RARE WBC PRESENT, PREDOMINANTLY MONONUCLEAR RARE SQUAMOUS EPITHELIAL CELLS PRESENT NO ORGANISMS  SEEN Performed at [facility]    Culture   Final    FEW ESCHERICHIA COLI Performed at [facility]    Report Status 08/27/2014 FINAL  Final   Organism ID, Bacteria ESCHERICHIA COLI  Final      Susceptibility   Escherichia coli - MIC*    AMPICILLIN <=2 SENSITIVE Sensitive     AMPICILLIN/SULBACTAM <=2 SENSITIVE Sensitive     CEFAZOLIN <=4 SENSITIVE Sensitive     CEFEPIME <=1 SENSITIVE Sensitive     CEFTAZIDIME <=1 SENSITIVE Sensitive     CEFTRIAXONE <=1 SENSITIVE Sensitive     CIPROFLOXACIN <=0.25 SENSITIVE Sensitive     GENTAMICIN <=1 SENSITIVE Sensitive     IMIPENEM <=0.25 SENSITIVE Sensitive     PIP/TAZO <=4  SENSITIVE Sensitive     TOBRAMYCIN <=1 SENSITIVE Sensitive     TRIMETH/SULFA <=20 SENSITIVE Sensitive     * FEW ESCHERICHIA COLI  Culture, Urine     Status: None   Collection Time: 08/25/14  6:41 AM  Result Value Ref Range Status   Specimen Description URINE, CATHETERIZED  Final   Special Requests NONE  Final   Culture 20,000 COLONIES/mL ESCHERICHIA COLI  Final   Report Status 08/27/2014 FINAL  Final   Organism ID, Bacteria ESCHERICHIA COLI  Final      Susceptibility   Escherichia coli - MIC*    AMPICILLIN <=2 SENSITIVE Sensitive     CEFAZOLIN <=4 SENSITIVE Sensitive     CEFTRIAXONE <=1 SENSITIVE Sensitive     CIPROFLOXACIN <=0.25 SENSITIVE Sensitive     GENTAMICIN <=1 SENSITIVE Sensitive     IMIPENEM <=0.25 SENSITIVE Sensitive     NITROFURANTOIN <=16 SENSITIVE Sensitive     TRIMETH/SULFA <=20 SENSITIVE Sensitive     AMPICILLIN/SULBACTAM <=2 SENSITIVE Sensitive     PIP/TAZO <=4 SENSITIVE Sensitive     * 20,000 COLONIES/mL ESCHERICHIA COLI     Scheduled Meds: . amiodarone  200 mg Oral Daily  . antiseptic oral rinse  7 mL Mouth Rinse BID  . cefTRIAXone (ROCEPHIN)  IV  2 g Intravenous Q24H  . cholestyramine  4 g Oral BID  . darbepoetin (ARANESP) injection - DIALYSIS  100 mcg Intravenous Q Tue-HD  . feeding supplement (NEPRO CARB STEADY)  237 mL  Oral BID BM  . feeding supplement (PRO-STAT SUGAR FREE 64)  30 mL Oral BID  . ferric gluconate (FERRLECIT/NULECIT) IV  62.5 mg Intravenous Q Thu-HD  . insulin aspart  0-9 Units Subcutaneous TID WC  . multivitamin  1 tablet Oral QHS  . pantoprazole  40 mg Oral QHS  . sevelamer carbonate  800 mg Oral TID WC  . simvastatin  20 mg Oral q1800  . sodium chloride  10-40 mL Intracatheter Q12H  . warfarin  2 mg Oral ONCE-1800  . warfarin  3 mg Oral ONCE-1800  . Warfarin - Pharmacist Dosing Inpatient   Does not apply q1800   Continuous Infusions: . sodium chloride Stopped (08/28/14 1200)

## 2014-08-29 NOTE — Progress Notes (Signed)
[facility] Progress Note  Assessment/Plan: 1. Septic shock - s/p intubation, tacheal aspirate - EColi on rocephin 2. Afib on po amio, coumadin - needs to clarify monitoring after d/Whitney 3. Vol excess - new dry wt ~ 104kg - see #6 4. S/P cardiac arrest EF 20% s/p cooling protocol  5. Vol overload/ marked pulm edema/VDRF now extubated - new dry wt and euvolemic now 6. ESRD TTS, HD  AM- last HD Saturday - goal 4L- UF volume NOT recorded; post HD weight NOT done- I have asked the charge RN to get a standing weight for me this am so we can see where we are volume wise. Next HD Tuesday 7. Anemia cont darbe 100/wk,+ weekly Fe on Tuesday Hgb 10.9 8. 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 HTN meds on hold, (coreg/ valsartan at home )- BP down with lowered volume 10. Nutrition - alb low, volume down a lot, unintentional weight loss- nepro added but can aggrevate stools, add prostat  Myriam Jacobson, PA-[place] [phone] 08/29/2014,9:30 AM  LOS: 16 days    Renal Attending: Agree with note as articulated above.  He is much improved and stable. Daniel [place]   Subjective:   Still with slight cough, having lose stools, only ate oatmeal this am.  Objective Filed Vitals:   08/28/14 1907 08/28/14 1920 08/28/14 2020 08/29/14 0459  BP: 118/104 125/73 93/57 101/82  Pulse: 108 105 95 144  Temp:  98.4 F (36.9 Whitney) 98.2 F (36.8 Whitney) 98.5 F (36.9 Whitney)  TempSrc:   Oral Oral  Resp: 108  18 17  Height:      Weight:  102.2 kg (225 lb 5 oz) 107.366 kg (236 lb 11.2 oz)   SpO2:   97% 97%   Physical Exam General: sitting in chair. NAD, somewhat sweaty. Heart: RRR Lungs: grossly clear, poor expansion Abdomen: obese soft Extremities: SCDs in place no overt edema Dialysis Access: left AVF + bruit  Dialysis Orders: [facility] 4h  EDW 119kg will have lower EDW at discharge  LFA AVF Heparin 3600 450/800 Hect 4 , Venofer 50/wk,  mircera 75 q2wks last 6/21  Additional Objective Labs: Lab Results  Component Value Date   INR 1.64* 08/29/2014   INR 2.04* 08/28/2014   INR 2.18* [id number]    Basic Metabolic Panel:  Recent Labs Lab 08/27/14 0400 08/28/14 0500 08/29/14 0618  NA 136 135 134*  K 4.2 4.3 3.7  CL 101 101 95*  CO2 22 22 28   GLUCOSE 98 143* 149*  BUN 32* 43* 20  CREATININE 6.87* 8.00* 5.44*  CALCIUM 9.0 9.5 8.9  PHOS 4.7* 3.5 3.2   Liver Function Tests:  Recent Labs Lab 08/24/14 1736 08/25/14 0520  08/27/14 0400 08/28/14 0500 08/29/14 0618  AST 33 30  --   --   --   --   ALT 22 22  --   --   --   --   ALKPHOS 306* 193*  --   --   --   --   BILITOT 1.6* 1.1  --   --   --   --   PROT 6.3* 5.6*  --   --   --   --   ALBUMIN 2.4* 2.0*  < > 2.0* 2.3* 2.3*  < > = values in this interval not displayed. No results for input(s): LIPASE, AMYLASE in the last 168 hours. CBC:  Recent Labs Lab 08/25/14 0520 08/26/14 0443  08/27/14 0400 08/28/14 0500 08/29/14 0618  WBC 34.2* 19.5* 14.8* 11.7* 7.0  NEUTROABS  --  16.9* 11.9* 7.8*  --   HGB 9.9* 8.8* 9.5* 10.8* 10.9*  HCT 31.2* 27.5* 28.8* 32.6* 33.3*  MCV 95.1 90.8 89.2 89.8 91.2  PLT 271 233 251 291 253   Blood Culture    Component Value Date/Time   SDES URINE, CATHETERIZED 08/25/2014 0641   SPECREQUEST NONE 08/25/2014 0641   CULT 20,000 COLONIES/mL ESCHERICHIA COLI 08/25/2014 0641   REPTSTATUS 08/27/2014 FINAL 08/25/2014 0641    Cardiac Enzymes:  Recent Labs Lab 08/24/14 1736 08/25/14 08/25/14 0520  TROPONINI 0.07* 0.07* 0.06*   CBG:  Recent Labs Lab 08/27/14 2217 08/28/14 0733 08/28/14 1135 08/28/14 1946 08/29/14 0729  GLUCAP 140* 127* 205* 111* 139*  Medications: . sodium chloride Stopped (08/28/14 1200)   . amiodarone  200 mg Oral Daily  . antiseptic oral rinse  7 mL Mouth Rinse BID  . cefTRIAXone (ROCEPHIN)  IV  2 g Intravenous Q24H  . cholestyramine  4 g Oral BID  . darbepoetin (ARANESP) injection -  DIALYSIS  100 mcg Intravenous Q Tue-HD  . ferric gluconate (FERRLECIT/NULECIT) IV  62.5 mg Intravenous Q Thu-HD  . insulin aspart  0-9 Units Subcutaneous TID WC  . multivitamin  1 tablet Oral QHS  . pantoprazole  40 mg Oral QHS  . sevelamer carbonate  800 mg Oral TID WC  . simvastatin  20 mg Oral q1800  . sodium chloride  10-40 mL Intracatheter Q12H  . warfarin  2 mg Oral ONCE-1800  . Warfarin - Pharmacist Dosing Inpatient   Does not apply [phone]

## 2014-08-30 LAB — CBC
HCT: 33.3 % — ABNORMAL LOW (ref 39.0–52.0)
HEMOGLOBIN: 10.6 g/dL — AB (ref 13.0–17.0)
MCH: 28.9 pg (ref 26.0–34.0)
MCHC: 31.8 g/dL (ref 30.0–36.0)
MCV: 90.7 fL (ref 78.0–100.0)
Platelets: 292 10*3/uL (ref 150–400)
RBC: 3.67 MIL/uL — ABNORMAL LOW (ref 4.22–5.81)
RDW: 16.2 % — ABNORMAL HIGH (ref 11.5–15.5)
WBC: 7.5 10*3/uL (ref 4.0–10.5)

## 2014-08-30 LAB — GLUCOSE, CAPILLARY
Glucose-Capillary: 159 mg/dL — ABNORMAL HIGH (ref 65–99)
Glucose-Capillary: 175 mg/dL — ABNORMAL HIGH (ref 65–99)
Glucose-Capillary: 145 mg/dL — ABNORMAL HIGH (ref 65–99)
Glucose-Capillary: 225 mg/dL — ABNORMAL HIGH (ref 65–99)

## 2014-08-30 LAB — RENAL FUNCTION PANEL
Albumin: 2.4 g/dL — ABNORMAL LOW (ref 3.5–5.0)
Anion gap: 12 (ref 5–15)
BUN: 28 mg/dL — ABNORMAL HIGH (ref 6–20)
CALCIUM: 9.4 mg/dL (ref 8.9–10.3)
CO2: 27 mmol/L (ref 22–32)
CREATININE: 7.13 mg/dL — AB (ref 0.61–1.24)
Chloride: 98 mmol/L — ABNORMAL LOW (ref 101–111)
GFR calc Af Amer: 9 mL/min — ABNORMAL LOW (ref >60)
GFR, EST NON AFRICAN AMERICAN: 7 mL/min — AB (ref >60)
GLUCOSE: 152 mg/dL — AB (ref 65–99)
Phosphorus: 3.4 mg/dL (ref 2.5–4.6)
Potassium: 3.9 mmol/L (ref 3.5–5.1)
Sodium: 137 mmol/L (ref 135–145)

## 2014-08-30 LAB — PROTIME-INR
INR: 1.27 (ref 0.00–1.49)
PROTHROMBIN TIME: 16 s — AB (ref 11.6–15.2)

## 2014-08-30 MED ORDER — WARFARIN SODIUM 4 MG PO TABS
4.5000 mg | ORAL_TABLET | Freq: Once | ORAL | Status: AC
  Administered 2014-08-30: 4.5 mg via ORAL
  Filled 2014-08-30 (×2): qty 1

## 2014-08-30 NOTE — Progress Notes (Signed)
Patient ID: Daniel Whitney, male   DOB: [birth year], 59 y.o.   MRN: [id number]  [facility] PROGRESS NOTE  Daniel Whitney [id number] DOB: 1955-08-28 DOA: 08/13/2014 PCP: Cathlean Cower, MD   Brief narrative:    59 year old male, [facility] WITNESs (will not take blood products) , ESRD on HD had missed his last 2 dialysis treatments. 7/01 early AM he developed acute onset SOB and then became unresponsive. EMS noted asystole and began CPR. Total downtime estimated at about 20 mins. In ED he had several runs of VT, but was otherwise hemodynamically stable on ventilator. PCCM admitted   STUDIES:  7/01 echo EF 20%, grade 2 DD  SIG EVENTS: 7/01 arrest, cooling started 7/02 HD, rewarm 7/09 extubated 7/13 reintubated 7/13 septic shock requiring vasopressors & atrial fibrillation with RVR 7/16 to Tele  Assessment/Plan:   Acute hypoxic respiratory failure with flash pulmonary edema, acute on chronic combined CHF - in pt with known OSA/OHS and non compliance with CPAP - ETT 7/1 >> 08/16/14, 7/4 (self extubated and reintubated) >>08/21/14, 7/12 >>> 7/14 - respiratory status stable this AM - provide xxygen to keep SpO2 > 92% - Will need sleep assessment as outpt  Septic shock secondary to tracheobronchitis, cultures positive for E. 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  Anemia of chronic illness,  ESRD - [employer] witness - no signs of active bleeding   DM type II with complications of nephropathy  - SSI - hold Januvia for now   Acute anoxic encephalopathy, metabolic and from sepsis and cardiac arrest  - secondary to the above - resolved.  Obesity - Body mass index is 33.91 kg/(m^2).  DVT prophylaxis - on Coumadin   Code Status: Full.  Family Communication:  plan of care discussed with the patient Disposition Plan: CIR consult requested   IV access:  Peripheral IV  Procedures and diagnostic studies:    Dg Thoracic Spine W/swimmers  07/31/2014   CLINICAL DATA:  Pain following fall  EXAM: THORACIC SPINE - 2 VIEW + SWIMMERS  COMPARISON:  Chest radiograph Jun 18, 2013  FINDINGS: Frontal, lateral, and swimmer's views obtained. There is no fracture or spondylolisthesis. There is mild disc space narrowing at several levels. There are multiple anterior and right lateral osteophytes. No erosive change.  IMPRESSION: Areas of osteoarthritic change.  No fracture or spondylolisthesis.   Electronically Signed   By: Lowella Grip III M.D.   On: 07/31/2014 20:27   Dg Lumbar Spine Complete  07/31/2014   CLINICAL DATA:  59 year old male who fell yesterday. Thoracolumbar pain. Initial encounter.  EXAM: LUMBAR SPINE - COMPLETE 4+ VIEW  COMPARISON:  Thoracic series from today reported separately.  FINDINGS: Normal lumbar segmentation. Compression deformity of the superior endplate of L3 which has an indistinct appearance favoring acute injury. Straightening of lumbar lordosis.  Other low lumbar vertebrae appear intact ; chronic or congenital wedging of L1 and L2 suspected.  Superimposed disc space loss and bulky endplate osteophytosis at L4-L5. No pars fracture. Sacral ala and SI joints within normal limits. Right total hip arthroplasty partially visible.  IMPRESSION: 1. Acute mild L3 compression fracture suspected. If specific therapy such as vertebroplasty is desired, Lumbar MRI or Nuclear  Medicine Whole-body Bone Scan would best confirm. 2. Lower lumbar disc and endplate degeneration.   Electronically Signed   By: Genevie Ann M.D.   On: 07/31/2014 20:35   Dg Abd 1 View  08/24/2014   CLINICAL DATA:  59 year old male with enteric tube placement.  EXAM: ABDOMEN - 1 VIEW  COMPARISON:  Radiograph dated 08/16/2014  FINDINGS: An enteric tubes noted extending into the epigastric area with tip to the right of the spine likely in the distal stomach or gastroduodenal junction. Air-filled loops of bowel noted in the pelvis. There is degenerative changes of the spine. Screws of the right hip arthroplasty are partially noted.  IMPRESSION: Enteric tube with tip in the distal stomach   Electronically Signed   By: Anner Crete M.D.   On: 08/24/2014 17:55   Ct Head Wo Contrast  08/23/2014   CLINICAL DATA:  Left arm and left leg weakness. Missed the last 2 dialysis treatments. In the morning of 08/13/2014 he developed acute onset shortness of breath and became unresponsive affiliated EMS began CPR. Total Downtime estimated at 20 minutes.  EXAM: CT HEAD WITHOUT CONTRAST  TECHNIQUE: Contiguous axial images were obtained from the base of the skull through the vertex without intravenous contrast.  COMPARISON:  08/13/2014  FINDINGS: Diffuse cerebral atrophy. Mild ventricular dilatation consistent with central atrophy. Low-attenuation changes in the deep white matter consistent with small vessel ischemia. No mass effect or midline shift. No abnormal extra-axial fluid collections. Gray-white matter junctions are distinct. Basal cisterns are not effaced. No evidence of acute intracranial hemorrhage. No depressed skull fractures. Postoperative changes in the paranasal sinuses. Mucosal thickening in the maxillary antra. Mastoid air cells are not opacified. Ectatic and calcified intracranial internal carotid arteries.  IMPRESSION: No acute intracranial abnormalities. Chronic atrophy and small vessel ischemic changes.  Vascular atherosclerotic changes.   Electronically Signed   By: Lucienne Capers M.D.   On: 08/23/2014 01:26   Ct Head Wo Contrast  08/13/2014   CLINICAL DATA:  Initial evaluation for possible seizure  EXAM: CT HEAD WITHOUT CONTRAST  TECHNIQUE: Contiguous axial images were obtained from the base of the skull through the vertex without intravenous contrast.  COMPARISON:  Prior study from 07/28/2013  FINDINGS: Mild atrophy again noted. Patchy hypodensity within the periventricular and deep white matter both cerebral hemispheres most consistent with chronic small vessel ischemic disease, similar to previous. Prominent atheromatous calcifications present within the carotid siphons and distal vertebral arteries.  No acute large vessel territory infarct. No intracranial hemorrhage. No mass lesion, midline shift, or mass effect. No extra-axial fluid collection  No hydrocephalus.  No extra-axial fluid collection.  No acute abnormality about the orbits.  Scattered mucosal thickening present throughout the paranasal sinuses with sparing of the frontal sinuses. Fluid present within the nasopharynx. Endotracheal tube partially visualized. No mastoid effusion.  Calvarium intact.  IMPRESSION: 1. No acute intracranial process. 2. Moderate chronic microvascular ischemic disease, similar to previous.   Electronically Signed   By: Jeannine Boga M.D.   On: 08/13/2014 04:51   Dg Chest Port 1 View  08/24/2014   CLINICAL DATA:  Respiratory failure and status post intubation and placement of new central line.  EXAM: PORTABLE CHEST - 1 VIEW  COMPARISON:  Film earlier today at 1340 hours  FINDINGS: Endotracheal tube is been placed with the tip approximately 2 cm above the carina. In addition to the pre-existing left subclavian central line, a new left jugular central line has been placed with the tip located in the midline in the expected position of the brachiocephalic vein. No pneumothorax. Aeration of both lower lung zones  has improved since the prior study with some mild residual airspace opacity remaining at both bases. No edema, pneumothorax or significant pleural fluid identified. Stable cardiac enlargement.  IMPRESSION: Endotracheal tube tip approximately 2 cm above the carina. Newly placed left jugular central line tip is in the brachiocephalic vein. Improved aeration of both lower lung zones since the prior study.   Electronically Signed   By: Aletta Edouard M.D.   On: 08/24/2014 17:55   Dg Chest Port 1 View  08/24/2014   CLINICAL DATA:  Right-sided chest pain, elevated white blood cell count.  EXAM: PORTABLE CHEST - 1 VIEW  COMPARISON:  Portable chest x-ray of August 22, 2014  FINDINGS: Positioning is more straight AP today. The lungs are adequately inflated. There remain increased interstitial lung markings at both bases. The cardiac silhouette remains enlarged. The central pulmonary vascularity is engorged and indistinct. There is a left subclavian venous catheter whose tip projects over the junction of the right and left brachiocephalic veins. The bony thorax is unremarkable.  IMPRESSION: 1. Persistent lower lobe increased interstitial markings which may reflect interstitial edema or less likely interstitial pneumonia. 2. Mild cardiomegaly and pulmonary vascular congestion consistent with CHF.   Electronically Signed   By: David  Martinique M.D.   On: 08/24/2014 13:58   Dg Chest Port 1 View  08/22/2014   CLINICAL DATA:  Adult respiratory distress syndrome. Recent cardiac arrest and respiratory failure. Recent extubation.  EXAM: PORTABLE CHEST - 1 VIEW  COMPARISON:  08/20/2014  FINDINGS: Endotracheal tube and nasogastric tube have been removed. Left subclavian central venous catheter remains in appropriate position. No pneumothorax visualized.  Decreased bibasilar atelectasis is seen compared to previous study. Cardiomegaly and diffuse pulmonary interstitial prominence remain stable. No evidence of pulmonary consolidation.   IMPRESSION: Decreased bibasilar atelectasis.  Stable cardiomegaly and diffuse pulmonary interstitial prominence.   Electronically Signed   By: Earle Gell M.D.   On: 08/22/2014 07:51   Dg Chest Port 1 View  08/20/2014   CLINICAL DATA:  CHF  EXAM: PORTABLE CHEST - 1 VIEW  COMPARISON:  08/19/2014  FINDINGS: The endotracheal tube tip remains just below the clavicular heads. The orogastric tube crosses the diaphragm at least. Stable left subclavian central line, tip at the upper SVC.  No change in cardiomegaly and vascular pedicle widening. Mediastinal contours are distorted by rightward rotation.  Poor inflation with persistent indistinct somewhat streaky bibasilar opacities. Pulmonary venous congestion.  IMPRESSION: 1. Stable positioning of tubes and central line. 2. Unchanged bibasilar atelectasis or pneumonia. 3. Stable cardiopericardial enlargement and pulmonary venous congestion.   Electronically Signed   By: Monte Fantasia M.D.   On: 08/20/2014 06:15   Dg Chest Port 1 View  08/19/2014   CLINICAL DATA:  Respiratory failure.  EXAM: PORTABLE CHEST - 1 VIEW  COMPARISON:  08/17/2014.  FINDINGS: Endotracheal tube, left subclavian line, and NG tube in good anatomic position. Cardiomegaly with partial clearing of bilateral pulmonary infiltrates and pleural effusions. These findings consistent with  improving congestive heart failure. No pneumothorax.  IMPRESSION: 1.  Lines and tubes in stable position.  2. Cardiomegaly with improvement of bilateral pulmonary infiltrates and pleural effusions suggesting partial clearing of congestive heart failure. Basilar pneumonia cannot be excluded.   Electronically Signed   By: [place]   On: 08/19/2014 07:30   Dg Chest Port 1 View  08/17/2014   CLINICAL DATA:  Cardiac arrest  EXAM: PORTABLE CHEST - 1 VIEW  COMPARISON:  08/16/2014  FINDINGS: The endotracheal tube is in good position, with tip between the clavicular heads and carina. The orogastric tube enters the stomach  at least. Stable left subclavian central line, tip near the upper SVC.  Worsening opacification of the lower chest compatible with airspace disease. There may be superimposed layering fluid. No air leak or pulmonary edema in the apical lungs.  Stable cardiomegaly.  IMPRESSION: 1. Stable positioning of tubes and central line. 2. Progression of extensive airspace disease, potentially aspiration pneumonia given the history.   Electronically Signed   By: Monte Fantasia M.D.   On: 08/17/2014 06:00   Dg Chest Port 1 View  08/16/2014   CLINICAL DATA:  Hypoxia.  OG tube placement.  Re-intubation.  EXAM: PORTABLE CHEST - 1 VIEW  COMPARISON:  08/15/2014  FINDINGS: Endotracheal tube 2.6 cm above the carina. NG tube is in the stomach. Left central line tip is in the SVC, unchanged. There is cardiomegaly. Low lung volumes with diffuse bilateral airspace opacities. Possible small effusions. No acute bony abnormality.  IMPRESSION: Low lung volumes with diffuse bilateral airspace disease.  Endotracheal tube 2.6 cm above the carina.   Electronically Signed   By: Rolm Baptise M.D.   On: 08/16/2014 10:54   Dg Chest Port 1 View  08/15/2014   CLINICAL DATA:  Shortness of breath, unresponsive.  Post CPR.  ARDS.  EXAM: PORTABLE CHEST - 1 VIEW  COMPARISON:  08/14/2014  FINDINGS: Support devices are stable. Cardiomegaly. Bilateral airspace opacities are noted, slightly improved on the left since prior study. No change on the right. Possible small bilateral effusions. No acute bony abnormality.  IMPRESSION: Diffuse bilateral airspace disease with some improvement on the left since prior study.   Electronically Signed   By: Rolm Baptise M.D.   On: 08/15/2014 07:41   Dg Chest Port 1 View  08/14/2014   CLINICAL DATA:  Cardiac arrest.  Now 1 caudally Ing protocol.  EXAM: PORTABLE CHEST - 1 VIEW  COMPARISON:  08/13/2014  FINDINGS: Endotracheal tube is 3.8 cm above the carina. The left subclavian central line extends into the SVC. The  nasogastric tube extends into the stomach. Parenchymal lung opacities are present bilaterally with slight interval improvement in the right lung.  IMPRESSION: Support equipment appears satisfactorily positioned.  Slight improvement in right lung opacities, now with better aeration. Multifocal opacities persist, particularly in the left lung.   Electronically Signed   By: Andreas Newport M.D.   On: 08/14/2014 05:35   Dg Chest Port 1 View  08/13/2014   CLINICAL DATA:  Central line placement.  EXAM: PORTABLE CHEST - 1 VIEW  COMPARISON:  Radiographs 08/13/2014.  FINDINGS: 1024 hr. The endotracheal tube remains low, approximately 2.2 cm above the carina. There is a new left subclavian central venous catheter projecting to the mid SVC level. Nasogastric tube projects below the diaphragm, tip not visualized. There has been significant worsening of bilateral airspace opacities, worse on the right. There is some associated volume loss. The heart is enlarged. No  pneumothorax or large pleural effusion identified.  IMPRESSION: Central line placement as described without complicating pneumothorax. The additional support system is unchanged; the endotracheal tube remains low.  Significant interval worsening of bilateral airspace opacities worrisome for aspiration.   Electronically Signed   By: Richardean Sale M.D.   On: 08/13/2014 10:41   Dg Chest Portable 1 View  08/13/2014   CLINICAL DATA:  Endotracheal intubation  EXAM: PORTABLE CHEST - 1 VIEW  COMPARISON:  08/13/2014 at 01:31  FINDINGS: Endotracheal tube is 2.5 cm above the carina. Nasogastric tube extends into the stomach. Linear left base opacities persist without significant interval change. Right lung is grossly clear. There is no large effusion. There is no pneumothorax.  IMPRESSION: Support equipment appears satisfactorily positioned.  No significant interval change in the linear left base opacities.   Electronically Signed   By: Andreas Newport M.D.   On:  08/13/2014 02:03   Dg Chest Port 1 View  08/13/2014   CLINICAL DATA:  Status post CPR status post intubation  EXAM: PORTABLE CHEST - 1 VIEW  COMPARISON:  01/29/2014  FINDINGS: Cardiac shadow is enlarged. An endotracheal tube is now seen at the level of the aortic knob in satisfactory position. A nasogastric catheter is noted extending into the distal esophagus. Its tip is not visualized on this exam. The lungs are well aerated. Slight increased density is noted in the left base although this may be in part due to overlying extrinsic artifact. No acute bony abnormality is seen.  IMPRESSION: Tubes and lines as described above.  Possible early left basilar infiltrate.   Electronically Signed   By: Inez Catalina M.D.   On: 08/13/2014 01:41   Dg Knee Complete 4 Views Left  07/31/2014   CLINICAL DATA:  Trip and fall injury last night. Landed on right side. Generalized left knee pain and bilateral hip pain. Difficulty bearing weight on the left knee.  EXAM: LEFT KNEE - COMPLETE 4+ VIEW  COMPARISON:  None.  FINDINGS: Prominent tricompartment degenerative changes in the left knee with bone on bone appearance in all 3 compartments, greatest laterally. Associated hypertrophic osteophytes. No evidence of acute fracture or dislocation. No focal bone lesion or bone destruction. No significant effusion. Extensive vascular calcifications.  IMPRESSION: Prominent tricompartment degenerative changes in the left knee. No acute bony abnormalities.   Electronically Signed   By: Lucienne Capers M.D.   On: 07/31/2014 22:21   Dg Abd Portable 1v  08/16/2014   CLINICAL DATA:  OG tube placement  EXAM: PORTABLE ABDOMEN - 1 VIEW  COMPARISON:  08/13/2014  FINDINGS: OG tube tip is in the proximal to mid stomach. Nonobstructive bowel gas pattern.  IMPRESSION: Enteric tube tip in the proximal to mid stomach.   Electronically Signed   By: Rolm Baptise M.D.   On: 08/16/2014 10:56   Dg Abd Portable 1v  08/13/2014   CLINICAL DATA:  Encounter  for orogastric tube placement.  EXAM: PORTABLE ABDOMEN - 1 VIEW  COMPARISON:  July 31, 2014.  FINDINGS: Distal tip of orogastric tube is seen in the expected position of the distal stomach or proximal duodenum. No abnormal bowel gas pattern is noted. Bilateral diffuse lung opacities are noted.  IMPRESSION: Distal tip of orogastric tube is seen in the expected position of the distal stomach or proximal duodenum. No evidence of bowel obstruction or ileus is noted.   Electronically Signed   By: Marijo Conception, M.D.   On: 08/13/2014 10:39   Dg Hips  Bilat With Pelvis 3-4 Views  07/31/2014   CLINICAL DATA:  Bilateral hip pain after slip and fall injury.  EXAM: BILATERAL HIP (WITH PELVIS) 3-4 VIEWS  COMPARISON:  None.  FINDINGS: Views of both hips are obtained.  The right hip demonstrates previous right total hip arthroplasty using non cemented femoral component. Components appear well seated. No evidence of acute fracture or dislocation.  Left hip demonstrates mild degenerative changes with superior acetabular joint space narrowing and sclerosis and osteophytes on both the femoral and acetabular surfaces. No evidence of acute fracture or dislocation.  Visualized pelvis is unremarkable although the entire pelvis is not included. Vascular calcifications.  IMPRESSION: The right total hip arthroplasty. Components appear well seated. Degenerative changes in the left hip. No acute fractures identified.   Electronically Signed   By: Lucienne Capers M.D.   On: 07/31/2014 22:22    Medical Consultants:  Nephrology   Other Consultants:  PT  IAnti-Infectives:   Rocephin   Faye Ramsay, MD  [place] Pager [phone]  If 7PM-7AM, please contact night-coverage [email or web address] Password [email or web address] 08/30/2014, 1:50 PM   LOS: 17 days   HPI/Subjective: No events overnight.   Objective: Filed Vitals:   08/29/14 2050 08/30/14 0501 08/30/14 0815 08/30/14 1000  BP: 103/63 115/70 126/70 115/73  Pulse: 92 99 98 102   Temp: 98.3 F (36.8 C) 98.5 F (36.9 C) 98.8 F (37.1 C) 98 F (36.7 C)  TempSrc: Oral Oral Oral Oral  Resp: 18 17 18 20   Height:      Weight: 104.2 kg (229 lb 11.5 oz)     SpO2: 94% 95% 95% 98%    Intake/Output Summary (Last 24 hours) at 08/30/14 1350 Last data filed at 08/30/14 1300  Gross per 24 hour  Intake   1370 ml  Output    525 ml  Net    845 ml    Exam:   General:  Pt is alert, follows commands appropriately, not in acute distress  Cardiovascular: Irregular rate and rhythm, no rubs, no gallops  Respiratory: Clear to auscultation bilaterally, no wheezing, no crackles, no rhonchi  Abdomen: Soft, non tender, non distended, bowel sounds present, no guarding  Extremities: pulses DP and PT palpable bilaterally  Neuro: Grossly nonfocal  Data Reviewed: Basic Metabolic Panel:  Recent Labs Lab 08/25/14 0520 08/26/14 0444 08/26/14 1400 08/27/14 0400 08/28/14 0500 08/29/14 0618 08/30/14 0444  NA 132* 131* 139 136 135 134* 137  K 4.4 3.6 3.7 4.2 4.3 3.7 3.9  CL 97* 97* 104 101 101 95* 98*  CO2 23 21* 22 22 22 28 27   GLUCOSE 178* 80 103* 98 143* 149* 152*  BUN 34* 41* 24* 32* 43* 20 28*  CREATININE 5.62* 7.36* 5.07* 6.87* 8.00* 5.44* 7.13*  CALCIUM 8.3* 8.6* 8.8* 9.0 9.5 8.9 9.4  MG 1.8 1.7 1.8 2.2 2.3  --   --   PHOS 4.4 4.7* 3.6 4.7* 3.5 3.2 3.4   Liver Function Tests:  Recent Labs Lab 08/24/14 1736 08/25/14 0520  08/26/14 1400 08/27/14 0400 08/28/14 0500 08/29/14 0618 08/30/14 0444  AST 33 30  --   --   --   --   --   --   ALT 22 22  --   --   --   --   --   --   ALKPHOS 306* 193*  --   --   --   --   --   --   BILITOT 1.6* 1.1  --   --   --   --   --   --  PROT 6.3* 5.6*  --   --   --   --   --   --   ALBUMIN 2.4* 2.0*  < > 2.2* 2.0* 2.3* 2.3* 2.4*  < > = values in this interval not displayed. CBC:  Recent Labs Lab 08/24/14 0809  08/26/14 0443 08/27/14 0400 08/28/14 0500 08/29/14 0618 08/30/14 0444  WBC 26.4*  < > 19.5* 14.8*  11.7* 7.0 7.5  NEUTROABS 23.7*  --  16.9* 11.9* 7.8*  --   --   HGB 10.9*  < > 8.8* 9.5* 10.8* 10.9* 10.6*  HCT 33.5*  < > 27.5* 28.8* 32.6* 33.3* 33.3*  MCV 91.8  < > 90.8 89.2 89.8 91.2 90.7  PLT 277  < > 233 251 291 253 292  < > = values in this interval not displayed. Cardiac Enzymes:  Recent Labs Lab 08/24/14 1736 08/25/14 08/25/14 0520  TROPONINI 0.07* 0.07* 0.06*   CBG:  Recent Labs Lab 08/29/14 1140 08/29/14 1639 08/29/14 2048 08/30/14 0733 08/30/14 1107  GLUCAP 198* 147* 191* 159* 175*    Recent Results (from the past 240 hour(s))  Culture, blood (routine x 2)     Status: None   Collection Time: 08/24/14 11:40 AM  Result Value Ref Range Status   Specimen Description BLOOD HEMODIALYSIS CATHETER  Final   Special Requests BOTTLES DRAWN AEROBIC AND ANAEROBIC 10CC  Final   Culture NO GROWTH 5 DAYS  Final   Report Status 08/29/2014 FINAL  Final  Culture, blood (routine x 2)     Status: None   Collection Time: 08/24/14 12:10 PM  Result Value Ref Range Status   Specimen Description BLOOD HEMODIALYSIS CATHETER  Final   Special Requests BOTTLES DRAWN AEROBIC AND ANAEROBIC 10CC  Final   Culture NO GROWTH 5 DAYS  Final   Report Status 08/29/2014 FINAL  Final  Culture, blood (x 2)     Status: None   Collection Time: 08/24/14  5:44 PM  Result Value Ref Range Status   Specimen Description BLOOD CENTRAL LINE  Final   Special Requests IN PEDIATRIC BOTTLE 4CC  Final   Culture NO GROWTH 5 DAYS  Final   Report Status 08/29/2014 FINAL  Final  Culture, blood (x 2)     Status: None   Collection Time: 08/24/14  6:50 PM  Result Value Ref Range Status   Specimen Description BLOOD RIGHT HAND  Final   Special Requests IN PEDIATRIC BOTTLE 3CC  Final   Culture NO GROWTH 5 DAYS  Final   Report Status 08/29/2014 FINAL  Final  Culture, respiratory (NON-Expectorated)     Status: None   Collection Time: 08/24/14  6:53 PM  Result Value Ref Range Status   Specimen Description TRACHEAL  ASPIRATE  Final   Special Requests NONE  Final   Gram Stain   Final    RARE WBC PRESENT, PREDOMINANTLY MONONUCLEAR RARE SQUAMOUS EPITHELIAL CELLS PRESENT NO ORGANISMS SEEN Performed at [facility]    Culture   Final    FEW ESCHERICHIA COLI Performed at [facility]    Report Status 08/27/2014 FINAL  Final   Organism ID, Bacteria ESCHERICHIA COLI  Final      Susceptibility   Escherichia coli - MIC*    AMPICILLIN <=2 SENSITIVE Sensitive     AMPICILLIN/SULBACTAM <=2 SENSITIVE Sensitive     CEFAZOLIN <=4 SENSITIVE Sensitive     CEFEPIME <=1 SENSITIVE Sensitive     CEFTAZIDIME <=1 SENSITIVE Sensitive     CEFTRIAXONE <=  1 SENSITIVE Sensitive     CIPROFLOXACIN <=0.25 SENSITIVE Sensitive     GENTAMICIN <=1 SENSITIVE Sensitive     IMIPENEM <=0.25 SENSITIVE Sensitive     PIP/TAZO <=4 SENSITIVE Sensitive     TOBRAMYCIN <=1 SENSITIVE Sensitive     TRIMETH/SULFA <=20 SENSITIVE Sensitive     * FEW ESCHERICHIA COLI  Culture, Urine     Status: None   Collection Time: 08/25/14  6:41 AM  Result Value Ref Range Status   Specimen Description URINE, CATHETERIZED  Final   Special Requests NONE  Final   Culture 20,000 COLONIES/mL ESCHERICHIA COLI  Final   Report Status 08/27/2014 FINAL  Final   Organism ID, Bacteria ESCHERICHIA COLI  Final      Susceptibility   Escherichia coli - MIC*    AMPICILLIN <=2 SENSITIVE Sensitive     CEFAZOLIN <=4 SENSITIVE Sensitive     CEFTRIAXONE <=1 SENSITIVE Sensitive     CIPROFLOXACIN <=0.25 SENSITIVE Sensitive     GENTAMICIN <=1 SENSITIVE Sensitive     IMIPENEM <=0.25 SENSITIVE Sensitive     NITROFURANTOIN <=16 SENSITIVE Sensitive     TRIMETH/SULFA <=20 SENSITIVE Sensitive     AMPICILLIN/SULBACTAM <=2 SENSITIVE Sensitive     PIP/TAZO <=4 SENSITIVE Sensitive     * 20,000 COLONIES/mL ESCHERICHIA COLI     Scheduled Meds: . amiodarone  200 mg Oral Daily  . antiseptic oral rinse  7 mL Mouth Rinse BID  . cefTRIAXone (ROCEPHIN)  IV  2 g  Intravenous Q24H  . cholestyramine  4 g Oral BID  . darbepoetin (ARANESP) injection - DIALYSIS  100 mcg Intravenous Q Tue-HD  . feeding supplement (NEPRO CARB STEADY)  237 mL Oral BID BM  . feeding supplement (PRO-STAT SUGAR FREE 64)  30 mL Oral BID  . ferric gluconate (FERRLECIT/NULECIT) IV  62.5 mg Intravenous Q Thu-HD  . insulin aspart  0-9 Units Subcutaneous TID WC  . multivitamin  1 tablet Oral QHS  . pantoprazole  40 mg Oral QHS  . sevelamer carbonate  800 mg Oral TID WC  . simvastatin  20 mg Oral q1800  . sodium chloride  10-40 mL Intracatheter Q12H  . warfarin  4.5 mg Oral ONCE-1800  . Warfarin - Pharmacist Dosing Inpatient   Does not apply q1800   Continuous Infusions: . sodium chloride Stopped (08/28/14 1200)

## 2014-08-30 NOTE — Progress Notes (Signed)
ANTICOAGULATION CONSULT NOTE - Follow up  Pharmacy Consult for warfarin Indication: atrial fibrillation  No Known Allergies  Patient Measurements: Height: 5\' 9"  (175.3 cm) Weight: 229 lb 11.5 oz (104.2 kg) IBW/kg (Calculated) : 70.7  Vital Signs: Temp: 98.8 F (37.1 C) (07/18 0815) Temp Source: Oral (07/18 0815) BP: 126/70 mmHg (07/18 0815) Pulse Rate: 98 (07/18 0815)  Labs:  Recent Labs  08/28/14 0500 08/29/14 0618 08/30/14 0444  HGB 10.8* 10.9* 10.6*  HCT 32.6* 33.3* 33.3*  PLT 291 253 292  LABPROT 22.9* 19.5* 16.0*  INR 2.04* 1.64* 1.27  CREATININE 8.00* 5.44* 7.13*    Estimated Creatinine Clearance: 13.1 mL/min (by C-G formula based on Cr of 7.13).   Medical History: Past Medical History  Diagnosis Date  . Glaucoma   . Hypertension   . Hyperlipidemia   . Atrial fibrillation 12/30/2012  . CHF (congestive heart failure) 12/30/2012  . Diabetic retinopathy 12/30/2012  . Morbid obesity 12/30/2012  . Pulmonary hypertension 12/30/2012  . Myocardial infarction     "I've had ~ 3; last one was in ~ 10/2013" (01/30/2014)  . Diabetes mellitus with neuropathy   . OSA (obstructive sleep apnea) 12/30/2012    "lost weight; no longer needed CPAP; retested said I needed it; didn't followup cause I was feeling fine" (01/30/2014)  . GERD (gastroesophageal reflux disease)   . Stroke ~ 2005; ~ 2005    "they were mild; I didn't even notice I'd had them"; denies residual on 01/30/2014  . History of gout     "right big toe"  . Arthritis     "all over"  . Chronic lower back pain   . ESRD (end stage renal disease) on dialysis started in 2013    "MWF; [facility]; [address]" (01/30/2014)  . Refusal of blood transfusions as patient is Jehovah's Witness   . Anemia due to other cause 07/07/2014  . Allergic rhinitis 07/07/2014    Medications:  Scheduled:  . amiodarone  200 mg Oral Daily  . antiseptic oral rinse  7 mL Mouth Rinse BID  . cefTRIAXone (ROCEPHIN)  IV  2 g  Intravenous Q24H  . cholestyramine  4 g Oral BID  . darbepoetin (ARANESP) injection - DIALYSIS  100 mcg Intravenous Q Tue-HD  . feeding supplement (NEPRO CARB STEADY)  237 mL Oral BID BM  . feeding supplement (PRO-STAT SUGAR FREE 64)  30 mL Oral BID  . ferric gluconate (FERRLECIT/NULECIT) IV  62.5 mg Intravenous Q Thu-HD  . insulin aspart  0-9 Units Subcutaneous TID WC  . multivitamin  1 tablet Oral QHS  . pantoprazole  40 mg Oral QHS  . sevelamer carbonate  800 mg Oral TID WC  . simvastatin  20 mg Oral q1800  . sodium chloride  10-40 mL Intracatheter Q12H  . Warfarin - Pharmacist Dosing Inpatient   Does not apply q1800    Assessment: 59 year old male with ESRD on HD had missed his last 2 dialysis treatments. Admitted 08/13/2014  early AM he developed acute onset SOB and then became unresponsive. EMS noted asystole and began CPR. Total estimated downtime estimated at about 20 mins. In ED he had several runs of VT  PMH: ESRD, Afib, CHF, DM, PAH, DM, OSA, CVA, MI, HTN, GERD, gout, HF  Anticoagulation/Heme: Afib on Coumadin PTA (4.5 mg Thurs, 3mg  AOD) >> held b/c in ICU. He was anticoagulated with IV heparin infusion from 7/3-7/11/16. Coumadin previously resumed on 7/10 to 7/12. Coumadin last given on 08/23/14. Now pharmacy consulted  to resume Coumadin on 08/28/14.   INR today =1.27 .   Amiodarone was started on 7/14. On 7/16 the  IV amiodarone changed to oral amiodarone 200 mg daily. Amiodarone likely to increase hypothrombinemic effect of coumadin. No bleeding noted.    Goal of Therapy:  INR 2-3 Monitor platelets by anticoagulation protocol: Yes   Plan:  - Warfarin 4.5 mg x 1 - Daily INR - Monitor for s/sx of bleeding  Uvaldo Rising, BCPS  Clinical Pharmacist Pager [phone]  08/30/2014 10:09 AM

## 2014-08-30 NOTE — Progress Notes (Signed)
I met with pt at bedside to discuss his rehab needs. His brother is at home with him except for about 3 hrs in the morning when he is at work. Pt previously drove self to dialysis and can arrange for transportation once discharged. I will verify in the morning if I will have an inpt rehab bed availalle to admit this pt tomorrow if felt medically ready to d/c I will see pt in the morning. [phone] 

## 2014-08-30 NOTE — Care Management Important Message (Signed)
Important Message  Patient Details  Name: Daniel Whitney MRN: [id number] Date of Birth: 1955-10-03   Medicare Important Message Given:  Yes-second notification given    [name] 08/30/2014, 2:00 PM

## 2014-08-30 NOTE — Progress Notes (Signed)
[facility] Progress Note  Assessment/Plan: Assessment: 1. Septic shock - s/p intubation, tacheal aspirate - EColi on rocephin.  2. Afib on po amio, coumadin - needs to clarify monitoring after d/c 3. Vol excess - new dry wt ~ 104kg - see #6 4. S/P cardiac arrest EF 20% s/p cooling protocol  5. Vol overload/ marked pulm edema/VDRF now extubated - new dry wt and euvolemic now 6. ESRD TTS, HD AM- last HD Saturday -HD tomorrow. New EDW 104 KG. Will attempt to reach EDW tormorrow.  7. Anemia cont darbe 100/wk,+ weekly Fe on Tuesday Hgb 10.9 8. 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 HTN meds on hold, (coreg/ valsartan at home )- BP down with lowered volume. 94/67-115/70.  10. Nutrition - alb low, volume down a lot, unintentional weight loss- nepro added but can aggrevate stools, add prostat  Rita H. Brown NP-C 08/30/2014, 8:40 AM  [employer] [phone]  Subjective:  "I feel all right today". Up in chair eating breakfast. Denies SOB/Chest pain. No C/Os.  Objective Filed Vitals:   08/29/14 0932 08/29/14 1042 08/29/14 2050 08/30/14 0501  BP: 94/67  103/63 115/70  Pulse: 97  92 99  Temp: 98.2 F (36.8 C)  98.3 F (36.8 C) 98.5 F (36.9 C)  TempSrc: Oral  Oral Oral  Resp: 18  18 17   Height:      Weight:  103.103 kg (227 lb 4.8 oz) 104.2 kg (229 lb 11.5 oz)   SpO2: 98%  94% 95%   Physical Exam General: Elderly male, well nourished NAD Heart: S1,S2, Irregular rhythm.  Lungs: Bilateral breath sounds CTA Abdomen:soft, nontender. Active BS Extremities: No edema. 2+ peripheral pulses. Dialysis Access: left AVF + bruit  Dialysis Orders: [facility] 4h  EDW 119kg will have lower EDW at discharge  LFA AVF Heparin 3600 450/800 Hect 4 , Venofer 50/wk, mircera 75 q2wks last 6/21   Additional Objective Labs: Basic Metabolic Panel:  Recent Labs Lab 08/28/14 0500 08/29/14 0618 08/30/14 0444  NA 135 134* 137  K 4.3 3.7  3.9  CL 101 95* 98*  CO2 22 28 27   GLUCOSE 143* 149* 152*  BUN 43* 20 28*  CREATININE 8.00* 5.44* 7.13*  CALCIUM 9.5 8.9 9.4  PHOS 3.5 3.2 3.4   Liver Function Tests:  Recent Labs Lab 08/24/14 1736 08/25/14 0520  08/28/14 0500 08/29/14 0618 08/30/14 0444  AST 33 30  --   --   --   --   ALT 22 22  --   --   --   --   ALKPHOS 306* 193*  --   --   --   --   BILITOT 1.6* 1.1  --   --   --   --   PROT 6.3* 5.6*  --   --   --   --   ALBUMIN 2.4* 2.0*  < > 2.3* 2.3* 2.4*  < > = values in this interval not displayed. No results for input(s): LIPASE, AMYLASE in the last 168 hours. CBC:  Recent Labs Lab 08/26/14 0443 08/27/14 0400 08/28/14 0500 08/29/14 0618 08/30/14 0444  WBC 19.5* 14.8* 11.7* 7.0 7.5  NEUTROABS 16.9* 11.9* 7.8*  --   --   HGB 8.8* 9.5* 10.8* 10.9* 10.6*  HCT 27.5* 28.8* 32.6* 33.3* 33.3*  MCV 90.8 89.2 89.8 91.2 90.7  PLT 233 251 291 253 292   Blood Culture    Component Value Date/Time   SDES URINE,  CATHETERIZED 08/25/2014 0641   SPECREQUEST NONE 08/25/2014 0641   CULT 20,000 COLONIES/mL ESCHERICHIA COLI 08/25/2014 0641   REPTSTATUS 08/27/2014 FINAL 08/25/2014 0641    Cardiac Enzymes:  Recent Labs Lab 08/24/14 1736 08/25/14 08/25/14 0520  TROPONINI 0.07* 0.07* 0.06*   CBG:  Recent Labs Lab 08/29/14 0729 08/29/14 1140 08/29/14 1639 08/29/14 2048 08/30/14 0733  GLUCAP 139* 198* 147* 191* 159*   Iron Studies: No results for input(s): IRON, TIBC, TRANSFERRIN, FERRITIN in the last 72 hours. @lablastinr3 @ Studies/Results: No results found. Medications:  sodium chloride Stopped (08/28/14 1200)    amiodarone  200 mg Oral Daily   antiseptic oral rinse  7 mL Mouth Rinse BID   cefTRIAXone (ROCEPHIN)  IV  2 g Intravenous Q24H   cholestyramine  4 g Oral BID   darbepoetin (ARANESP) injection - DIALYSIS  100 mcg Intravenous Q Tue-HD   feeding supplement (NEPRO CARB STEADY)  237 mL Oral BID BM   feeding supplement (PRO-STAT SUGAR  FREE 64)  30 mL Oral BID   ferric gluconate (FERRLECIT/NULECIT) IV  62.5 mg Intravenous Q Thu-HD   insulin aspart  0-9 Units Subcutaneous TID WC   multivitamin  1 tablet Oral QHS   pantoprazole  40 mg Oral QHS   sevelamer carbonate  800 mg Oral TID WC   simvastatin  20 mg Oral q1800   sodium chloride  10-40 mL Intracatheter Q12H   Warfarin - Pharmacist Dosing Inpatient   Does not apply [phone]

## 2014-08-30 NOTE — Progress Notes (Signed)
Physical Therapy Treatment Patient Details Name: Daniel Whitney MRN: [id number] DOB: November 25, 1955 Today's Date: 08/30/2014    History of Present Illness Pt is a 59 year old male with ESRD on HD who had missed his last 2 dialysis treatments. 7/01 early AM he developed acute onset SOB and then became unresponsive. EMS noted asystole and began CPR. Total downtime estimated at about 20 mins. In ED he had several runs of VT, but was otherwise was hemodynamically stable on ventilator. PCCM to admit. Pt intubated upon admission with periods of self exutbation and prompt reintubation. 7/9 exutabted by respiratory therapy. Most recent Chest x ray showed bibasilar atelectasis or PNA with pulmonary venous congestion. 7/12 pt with sepsis to ICU, vented and sedated; extubated, and medically making improvements    PT Comments    Significant improvements over last few sessions -- walked in hallway with RW; dependent on UE support, which is a decline from baseline; able to continue to work on amb distance with seated rest breaks;   Will benefit from a standardized balance assessment next session;   Anticipate good progress at CIR for post-acute rehab to maximize independence and safety with mobility; Have updated Pamala Hurry, [place] Admissions Coordinator   Follow Up Recommendations  CIR;Supervision/Assistance - 24 hour     Equipment Recommendations  None recommended by PT    Recommendations for Other Services       Precautions / Restrictions Precautions Precautions: Fall    Mobility  Bed Mobility               General bed mobility comments: In recliner upon arrival  Transfers Overall transfer level: Needs assistance Equipment used: Rolling walker (2 wheeled) Transfers: Sit to/from Stand Sit to Stand: +2 safety/equipment;Min assist         General transfer comment: Braced LEs on chair for stability with initial stand -- this improved with practice and repetition; Wide stance and increased  time required to gain/maintain balance; Dependent on UE push and use of armrests  Ambulation/Gait Ambulation/Gait assistance: Min assist;Min guard;+2 safety/equipment (Second person to push chair behind) Ambulation Distance (Feet): 50 Feet (x3 with seated rest breaks in between) Assistive device: Rolling walker (2 wheeled) Gait Pattern/deviations: Step-through pattern;Trunk flexed (trunk minimally flexed; corrects with cues) Gait velocity: slowed   General Gait Details: Noting significant improvements in steps and gait over last session; Posterior lean much improved and not effecting abiltiy to walk; Noted dependence on UE support during amb   Stairs            Wheelchair Mobility    Modified Rankin (Stroke Patients Only)       Balance Overall balance assessment: Needs assistance         Standing balance support: Bilateral upper extremity supported Standing balance-Leahy Scale: Poor Standing balance comment: Dependent on UE support                    Cognition Arousal/Alertness: Awake/alert Behavior During Therapy: WFL for tasks assessed/performed Overall Cognitive Status: Within Functional Limits for tasks assessed                      Exercises Other Exercises Other Exercises: Serial sit to/from stands, 3 sets of 5    General Comments General comments (skin integrity, edema, etc.): Back pain improved with lumbar support in chair      Pertinent Vitals/Pain Pain Assessment: 0-10 Pain Score: 4  Pain Location: Low back Pain Descriptors / Indicators: Aching Pain Intervention(s): Monitored  during session;Repositioned;Other (comment) (Added lumbar support in recliner)    Home Living                      Prior Function            PT Goals (current goals can now be found in the care plan section) Acute Rehab PT Goals Patient Stated Goal: Very motivated to return to PLOF PT Goal Formulation: With patient Time For Goal Achievement:  08/29/14 Potential to Achieve Goals: Good Progress towards PT goals: Progressing toward goals    Frequency  Min 3X/week    PT Plan Current plan remains appropriate    Co-evaluation             End of Session Equipment Utilized During Treatment: Gait belt Activity Tolerance: Patient tolerated treatment well Patient left: in chair;with call bell/phone within reach     Time: 0929-0959 PT Time Calculation (min) (ACUTE ONLY): 30 min  Charges:  $Gait Training: 8-22 mins $Therapeutic Activity: 8-22 mins                    G Codes:      Daniel Whitney 08/30/2014, 10:22 AM  Daniel Whitney, Daniel Whitney Pager [phone] Office [phone]

## 2014-08-31 LAB — RENAL FUNCTION PANEL
ALBUMIN: 2.3 g/dL — AB (ref 3.5–5.0)
Anion gap: 13 (ref 5–15)
BUN: 37 mg/dL — ABNORMAL HIGH (ref 6–20)
CALCIUM: 8.7 mg/dL — AB (ref 8.9–10.3)
CO2: 22 mmol/L (ref 22–32)
Chloride: 97 mmol/L — ABNORMAL LOW (ref 101–111)
Creatinine, Ser: 8.19 mg/dL — ABNORMAL HIGH (ref 0.61–1.24)
GFR calc Af Amer: 7 mL/min — ABNORMAL LOW (ref >60)
GFR calc non Af Amer: 6 mL/min — ABNORMAL LOW (ref >60)
Glucose, Bld: 142 mg/dL — ABNORMAL HIGH (ref 65–99)
Phosphorus: 3.8 mg/dL (ref 2.5–4.6)
Potassium: 3.5 mmol/L (ref 3.5–5.1)
Sodium: 132 mmol/L — ABNORMAL LOW (ref 135–145)

## 2014-08-31 LAB — CBC
HCT: 31.9 % — ABNORMAL LOW (ref 39.0–52.0)
HEMOGLOBIN: 10.1 g/dL — AB (ref 13.0–17.0)
MCH: 28.9 pg (ref 26.0–34.0)
MCHC: 31.7 g/dL (ref 30.0–36.0)
MCV: 91.4 fL (ref 78.0–100.0)
Platelets: 290 10*3/uL (ref 150–400)
RBC: 3.49 MIL/uL — ABNORMAL LOW (ref 4.22–5.81)
RDW: 16.4 % — AB (ref 11.5–15.5)
WBC: 7.6 10*3/uL (ref 4.0–10.5)

## 2014-08-31 LAB — GLUCOSE, CAPILLARY
Glucose-Capillary: 131 mg/dL — ABNORMAL HIGH (ref 65–99)
Glucose-Capillary: 218 mg/dL — ABNORMAL HIGH (ref 65–99)

## 2014-08-31 LAB — PROTIME-INR
INR: 1.29 (ref 0.00–1.49)
PROTHROMBIN TIME: 16.2 s — AB (ref 11.6–15.2)

## 2014-08-31 MED ORDER — WARFARIN SODIUM 3 MG PO TABS: 3.0000 mg | ORAL_TABLET | ORAL | Status: DC

## 2014-08-31 MED ORDER — ONDANSETRON HCL 4 MG PO TABS: 4.0000 mg | ORAL_TABLET | Freq: Four times a day (QID) | ORAL | Status: DC

## 2014-08-31 MED ORDER — HYDROXYZINE HCL 25 MG PO TABS: 25.0000 mg | ORAL_TABLET | Freq: Three times a day (TID) | ORAL | Status: DC | PRN

## 2014-08-31 MED ORDER — DARBEPOETIN ALFA 100 MCG/0.5ML IJ SOSY
PREFILLED_SYRINGE | INTRAMUSCULAR | Status: AC
  Administered 2014-08-31: 100 ug via INTRAVENOUS
  Filled 2014-08-31: qty 0.5

## 2014-08-31 MED ORDER — CHOLESTYRAMINE 4 G PO PACK: 4.0000 g | PACK | Freq: Two times a day (BID) | ORAL | Status: DC

## 2014-08-31 MED ORDER — WARFARIN SODIUM 4 MG PO TABS
4.5000 mg | ORAL_TABLET | Freq: Once | ORAL | Status: AC
  Administered 2014-08-31: 4.5 mg via ORAL
  Filled 2014-08-31: qty 1

## 2014-08-31 NOTE — Care Management Note (Signed)
Case Management Note  Patient Details  Name: Daniel Whitney MRN: [id number] Date of Birth: 03-Mar-1955  Subjective/Objective:       CM following for progression and d/c planning.             Action/Plan:  08/30/2014 Noted plan for CIR to eval this pt, however pt was able to walk with walker the full length of the hall on 6E, also noted up in room performing ADL's.  08/31/2014 Pt discussed in [place] and recommendation given to this pt attending that he pt be d/c to home.  Expected Discharge Date:       08/31/2014           Expected Discharge Plan:  [place]  In-House Referral:  NA  Discharge planning Services  CM Consult  Post Acute Care Choice:    Choice offered to:     DME Arranged:    DME Agency:     HH Arranged:    HH Agency:     Status of Service:  In process, will continue to follow  Medicare Important Message Given:  Yes-second notification given Date Medicare IM Given:    Medicare IM give by:    Date Additional Medicare IM Given:    Additional Medicare Important Message give by:     If discussed at [place] of Stay Meetings, dates discussed:    Additional Comments:  Adron Bene, RN 08/31/2014, 12:35 PM

## 2014-08-31 NOTE — Progress Notes (Signed)
ANTICOAGULATION CONSULT NOTE - Follow up  Pharmacy Consult for warfarin Indication: atrial fibrillation  No Known Allergies  Patient Measurements: Height: 5\' 9"  (175.3 cm) Weight: 231 lb 7.7 oz (105 kg) IBW/kg (Calculated) : 70.7  Vital Signs: Temp: 98.4 F (36.9 C) (07/19 0653) Temp Source: Oral (07/19 0653) BP: 98/65 mmHg (07/19 0836) Pulse Rate: 109 (07/19 0836)  Labs:  Recent Labs  08/29/14 0618 08/30/14 0444 08/31/14 0400 08/31/14 0415  HGB 10.9* 10.6* 10.1*  --   HCT 33.3* 33.3* 31.9*  --   PLT 253 292 290  --   LABPROT 19.5* 16.0* 16.2*  --   INR 1.64* 1.27 1.29  --   CREATININE 5.44* 7.13*  --  8.19*    Estimated Creatinine Clearance: 11.5 mL/min (by C-G formula based on Cr of 8.19).   Medical History: Past Medical History  Diagnosis Date  . Glaucoma   . Hypertension   . Hyperlipidemia   . Atrial fibrillation 12/30/2012  . CHF (congestive heart failure) 12/30/2012  . Diabetic retinopathy 12/30/2012  . Morbid obesity 12/30/2012  . Pulmonary hypertension 12/30/2012  . Myocardial infarction     "I've had ~ 3; last one was in ~ 10/2013" (01/30/2014)  . Diabetes mellitus with neuropathy   . OSA (obstructive sleep apnea) 12/30/2012    "lost weight; no longer needed CPAP; retested said I needed it; didn't followup cause I was feeling fine" (01/30/2014)  . GERD (gastroesophageal reflux disease)   . Stroke ~ 2005; ~ 2005    "they were mild; I didn't even notice I'd had them"; denies residual on 01/30/2014  . History of gout     "right big toe"  . Arthritis     "all over"  . Chronic lower back pain   . ESRD (end stage renal disease) on dialysis started in 2013    "MWF; [facility]; [address]" (01/30/2014)  . Refusal of blood transfusions as patient is Jehovah's Witness   . Anemia due to other cause 07/07/2014  . Allergic rhinitis 07/07/2014    Medications:  Scheduled:  . amiodarone  200 mg Oral Daily  . antiseptic oral rinse  7 mL Mouth Rinse BID   . cefTRIAXone (ROCEPHIN)  IV  2 g Intravenous Q24H  . cholestyramine  4 g Oral BID  . Darbepoetin Alfa      . darbepoetin (ARANESP) injection - DIALYSIS  100 mcg Intravenous Q Tue-HD  . feeding supplement (NEPRO CARB STEADY)  237 mL Oral BID BM  . feeding supplement (PRO-STAT SUGAR FREE 64)  30 mL Oral BID  . ferric gluconate (FERRLECIT/NULECIT) IV  62.5 mg Intravenous Q Thu-HD  . insulin aspart  0-9 Units Subcutaneous TID WC  . multivitamin  1 tablet Oral QHS  . pantoprazole  40 mg Oral QHS  . sevelamer carbonate  800 mg Oral TID WC  . simvastatin  20 mg Oral q1800  . sodium chloride  10-40 mL Intracatheter Q12H  . Warfarin - Pharmacist Dosing Inpatient   Does not apply q1800    Assessment: 59 year old male with ESRD on HD had missed his last 2 dialysis treatments. Admitted 08/13/2014  early AM he developed acute onset SOB and then became unresponsive. EMS noted asystole and began CPR. Total estimated downtime estimated at about 20 mins. In ED he had several runs of VT  PMH: ESRD, Afib, CHF, DM, PAH, DM, OSA, CVA, MI, HTN, GERD, gout, HF  Anticoagulation/Heme: Afib on Coumadin PTA (4.5 mg Thurs, 3mg  AOD) >>  held b/c in ICU. He was anticoagulated with IV heparin infusion from 7/3-7/11/16. Coumadin previously resumed on 7/10 to 7/12, then stopped, then pharmacy consulted to resume Coumadin on 08/28/14, although for some reason dose not charted that evening.   INR today =1.29.   Amiodarone was started on 7/14. On 7/16 the  IV amiodarone changed to oral amiodarone 200 mg daily. Amiodarone likely to increase hypothrombinemic effect of coumadin. No bleeding noted.   Goal of Therapy:  INR 2-3 Monitor platelets by anticoagulation protocol: Yes   Plan:  - Warfarin 4.5 mg x 1 - Daily INR - Monitor for s/sx of bleeding  Uvaldo Rising, BCPS  Clinical Pharmacist Pager [phone]  08/31/2014 9:02 AM

## 2014-08-31 NOTE — Progress Notes (Signed)
Physical Therapy Treatment Patient Details Name: Daniel Whitney MRN: [id number] DOB: [birth year] Today's Date: 08/31/2014    History of Present Illness Pt is a 59 year old male with ESRD on HD who had missed his last 2 dialysis treatments. 7/01 early AM he developed acute onset SOB and then became unresponsive. EMS noted asystole and began CPR. Total downtime estimated at about 20 mins. In ED he had several runs of VT, but was otherwise was hemodynamically stable on ventilator. PCCM to admit. Pt intubated upon admission with periods of self exutbation and prompt reintubation. 7/9 exutabted by respiratory therapy. Most recent Chest x ray showed bibasilar atelectasis or PNA with pulmonary venous congestion. 7/12 pt with sepsis to ICU, vented and sedated    PT Comments    Continuing progress; had "progressed out" of being appropriate for CIR, and will now dc home; Moved well today, and I agree with dc plan for home  Follow Up Recommendations  Supervision/Assistance - 24 hour;Home health PT     Equipment Recommendations  None recommended by PT    Recommendations for Other Services       Precautions / Restrictions Precautions Precautions: Fall Precaution Comments: Fall risk greatly reduced with use of rW    Mobility  Bed Mobility Overal bed mobility: Needs Assistance Bed Mobility: Supine to Sit     Supine to sit: Min assist     General bed mobility comments: Handheld assist to pull to sit  Transfers Overall transfer level: Needs assistance Equipment used: Rolling walker (2 wheeled) Transfers: Sit to/from Stand Sit to Stand: Min guard         General transfer comment: Cues for safety and hand placement  Ambulation/Gait Ambulation/Gait assistance: Min guard (with and without physical contact) Ambulation Distance (Feet): 200 Feet Assistive device: Rolling walker (2 wheeled)   Gait velocity: slowed   General Gait Details: Cues to self-monitor for activity  tolerance   Stairs            Wheelchair Mobility    Modified Rankin (Stroke Patients Only)       Balance             Standing balance-Leahy Scale: Fair                      Cognition Arousal/Alertness: Awake/alert Behavior During Therapy: WFL for tasks assessed/performed Overall Cognitive Status: Within Functional Limits for tasks assessed                      Exercises      General Comments        Pertinent Vitals/Pain Pain Assessment: Faces Faces Pain Scale: Hurts a little bit Pain Location: Low back Pain Descriptors / Indicators: Aching Pain Intervention(s): Monitored during session    Home Living                      Prior Function            PT Goals (current goals can now be found in the care plan section) Acute Rehab PT Goals Patient Stated Goal: Very motivated to return to PLOF PT Goal Formulation: With patient Time For Goal Achievement: 08/29/14 Potential to Achieve Goals: Good Progress towards PT goals: Progressing toward goals    Frequency  Min 3X/week    PT Plan Current plan remains appropriate;Discharge plan needs to be updated    Co-evaluation  End of Session Equipment Utilized During Treatment: Gait belt Activity Tolerance: Patient tolerated treatment well Patient left: in chair;with call bell/phone within reach     Time: 1526-1550 PT Time Calculation (min) (ACUTE ONLY): 24 min  Charges:  $Gait Training: 23-37 mins                    G Codes:      Quin Hoop 08/31/2014, 4:44 PM  Roney Marion, [place] Pager [phone] Office [phone]

## 2014-08-31 NOTE — Discharge Instructions (Signed)
Acute Respiratory Distress Syndrome  Acute respiratory distress syndrome (ARDS) is a serious, life-threatening lung condition that can cause breathing failure. It occurs in people who are critically ill or in people who have had a serious injury.  CAUSES  ARDS occurs when small blood vessels in the lungs leak fluid into the air sacs (alveoli) of the lungs. The fluid causes the lungs to become "stiff" and decreases their ability to inflate. The fluid also prevents oxygen from being absorbed into the bloodstream. When the bloodstream does not have enough oxygen, the body's vital organs do not get enough oxygen to function properly. ARDS can occur in the following conditions:  Sepsis. This is a serious bloodstream infection.  Serious injury (trauma) to the head or chest.  Pneumonia.  After major surgery, such as a lung transplant.  Drug overdose.  Breathing (inhalation) of harmful chemicals. SYMPTOMS  ARDS comes on quickly (rapid onset) and can occur within 24 to 48 hours of an infection, illness, surgery, or injury. Symptoms include:  Shortness of breath or difficulty breathing.  Cyanosis. This is a bluish color to the skin or nail beds (due to low oxygen levels in the blood).  Fast or irregular heart rate.  Low blood pressure (hypotension).  Organ failure. DIAGNOSIS  There is not a specific test to diagnose ARDS. It is usually diagnosed when other diseases and conditions that cause similar symptoms have been ruled out. When a person is thought to have ARDS, the following tests may be performed:  A chest X-ray or computed tomography (CT) scan to look at the lungs.  Arterial blood gas (ABG) analysis. This test looks at the oxygen level in the blood.  Blood tests to rule out infection.  Sputum culture to rule out a lung infection.  Bronchoscopy. TREATMENT  ARDS is a critical condition. People who develop ARDS need to be in a hospital intensive care unit (ICU). Treatment of ARDS  includes:  Providing oxygenation. This is a main treatment goal of ARDS. A breathing machine (ventilator) is often used to help a person breathe and to provide oxygen. When on a breathing machine, medicine is given to keep patients asleep (sedated).  Treatment of the underlying cause of ARDS (infection, illness, or trauma).  Supportive treatment such as:  Intravenous (IV) fluids.  Liquid nutrition that goes through an IV or feeding tube.  Blood pressure medicine to support low blood pressure.  Antibiotic medicine to help fight infection.  Steroid medicine to help decrease swelling (inflammation) in the lungs.  Diuretic medicine to get rid of extra fluid in the body. HOME CARE INSTRUCTIONS  After recovering from ARDS, you may have weakness, shortness of breath, or memory problems. You may also suffer from depression or from complications of the illness that caused ARDS. You can do several things to help your recovery:  Do not smoke.  If you drink alcohol, limit the amount of alcohol you drink to 1 or 2 drinks a day.  Be sure you get a yearly flu (influenza) shot. You should get a pneumonia vaccine once every 5 years.  Ask your caregiver about lung rehabilitation programs.  Ask your caregiver about local support groups for people with breathing problems.  Ask friends and family to help you if daily activities make you tired. SEEK MEDICAL CARE IF:   You become short of breath with activity or while at rest.  You develop a cough that does not go away. SEEK IMMEDIATE MEDICAL CARE IF:   You have sudden  shortness of breath with or without chest pain.  You have chest pain that does not go away.  You develop swelling or pain in one of your legs.  You have trauma to your chest or any other part of your body.  You have a fever.  You overdose or have a reaction to your medicine. MAKE SURE YOU:   Understand these instructions.  Will watch your condition.  Will get help  right away if you are not doing well or get worse. Document Released: 01/29/2005 Document Revised: 06/15/2013 Document Reviewed: 10/18/2010 [facility] Patient Information 2015 [place], [place]. This information is not intended to replace advice given to you by your health care provider. Make sure you discuss any questions you have with your health care provider.  Information on my medicine - Coumadin   (Warfarin)  This medication education was reviewed with me or my healthcare representative as part of my discharge preparation.  The pharmacist that spoke with me during my hospital stay was:  Pat Patrick, [facility]  Why was Coumadin prescribed for you? Coumadin was prescribed for you because you have a blood clot or a medical condition that can cause an increased risk of forming blood clots. Blood clots can cause serious health problems by blocking the flow of blood to the heart, lung, or brain. Coumadin can prevent harmful blood clots from forming. As a reminder your indication for Coumadin is:   Stroke Prevention Because Of Atrial Fibrillation  What test will check on my response to Coumadin? While on Coumadin (warfarin) you will need to have an INR test regularly to ensure that your dose is keeping you in the desired range. The INR (international normalized ratio) number is calculated from the result of the laboratory test called prothrombin time (PT).  If an INR APPOINTMENT HAS NOT ALREADY BEEN MADE FOR YOU please schedule an appointment to have this lab work done by your health care provider within 7 days. Your INR goal is usually a number between:  2 to 3 or your provider may give you a more narrow range like 2-2.5.  Ask your health care provider during an office visit what your goal INR is.  What  do you need to  know  About  COUMADIN? Take Coumadin (warfarin) exactly as prescribed by your healthcare provider about the same time each day.  DO NOT stop taking without talking to the doctor who  prescribed the medication.  Stopping without other blood clot prevention medication to take the place of Coumadin may increase your risk of developing a new clot or stroke.  Get refills before you run out.  What do you do if you miss a dose? If you miss a dose, take it as soon as you remember on the same day then continue your regularly scheduled regimen the next day.  Do not take two doses of Coumadin at the same time.  Important Safety Information A possible side effect of Coumadin (Warfarin) is an increased risk of bleeding. You should call your healthcare provider right away if you experience any of the following: ? Bleeding from an injury or your nose that does not stop. ? Unusual colored urine (red or dark brown) or unusual colored stools (red or black). ? Unusual bruising for unknown reasons. ? A serious fall or if you hit your head (even if there is no bleeding).  Some foods or medicines interact with Coumadin (warfarin) and might alter your response to warfarin. To help avoid this: ? Eat  a balanced diet, maintaining a consistent amount of Vitamin K. ? Notify your provider about major diet changes you plan to make. ? Avoid alcohol or limit your intake to 1 drink for women and 2 drinks for men per day. (1 drink is 5 oz. wine, 12 oz. beer, or 1.5 oz. liquor.)  Make sure that ANY health care provider who prescribes medication for you knows that you are taking Coumadin (warfarin).  Also make sure the healthcare provider who is monitoring your Coumadin knows when you have started a new medication including herbals and non-prescription products.  Coumadin (Warfarin)  Major Drug Interactions  Increased Warfarin Effect Decreased Warfarin Effect  Alcohol (large quantities) Antibiotics (esp. Septra/Bactrim, Flagyl, Cipro) Amiodarone (Cordarone) Aspirin (ASA) Cimetidine (Tagamet) Megestrol (Megace) NSAIDs (ibuprofen, naproxen, etc.) Piroxicam (Feldene) Propafenone (Rythmol  SR) Propranolol (Inderal) Isoniazid (INH) Posaconazole (Noxafil) Barbiturates (Phenobarbital) Carbamazepine (Tegretol) Chlordiazepoxide (Librium) Cholestyramine (Questran) Griseofulvin Oral Contraceptives Rifampin Sucralfate (Carafate) Vitamin K   Coumadin (Warfarin) Major Herbal Interactions  Increased Warfarin Effect Decreased Warfarin Effect  Garlic Ginseng Ginkgo biloba Coenzyme Q10 Green tea St. Johns wort    Coumadin (Warfarin) FOOD Interactions  Eat a consistent number of servings per week of foods HIGH in Vitamin K (1 serving =  cup)  Collards (cooked, or boiled & drained) Kale (cooked, or boiled & drained) Mustard greens (cooked, or boiled & drained) Parsley *serving size only =  cup Spinach (cooked, or boiled & drained) Swiss chard (cooked, or boiled & drained) Turnip greens (cooked, or boiled & drained)  Eat a consistent number of servings per week of foods MEDIUM-HIGH in Vitamin K (1 serving = 1 cup)  Asparagus (cooked, or boiled & drained) Broccoli (cooked, boiled & drained, or raw & chopped) Brussel sprouts (cooked, or boiled & drained) *serving size only =  cup Lettuce, raw (green leaf, endive, romaine) Spinach, raw Turnip greens, raw & chopped   These websites have more information on Coumadin (warfarin):  FailFactory.se; VeganReport.com.au;

## 2014-08-31 NOTE — Procedures (Signed)
I was present at this dialysis session. I have reviewed the session itself and made appropriate changes.   Hb stable.  BP ok.  1L UF goal. For CIR?  Pearson Grippe  MD 08/31/2014, 9:15 AM

## 2014-08-31 NOTE — Progress Notes (Signed)
PT Cancellation Note  Patient Details Name: Daniel Whitney MRN: [id number] DOB: 1955/11/20   Cancelled Treatment:    Reason Eval/Treat Not Completed: Patient at procedure or test/unavailable   Currently in HD; Hope to  follow up later today as time allows;  Otherwise, will follow up for PT tomorrow;   Continue to recommend comprehensive inpatient rehab (CIR) for post-acute therapy needs.   Thank you,  Roney Marion, [place] Pager [phone] Office [phone]     Roney Marion [facility] 08/31/2014, 8:18 AM

## 2014-08-31 NOTE — Progress Notes (Signed)
I met with pt in dialysis. I do not have an inpt rehab bed to offer pt today. RN CM and SW are aware. [phone]

## 2014-08-31 NOTE — Discharge Summary (Signed)
Physician Discharge Summary  Daniel Whitney [id number] DOB: 1955-05-20 DOA: 08/13/2014  PCP: Cathlean Cower, MD  Admit date: 08/13/2014 Discharge date: 08/31/2014  Recommendations for Outpatient Follow-up:  1. Pt will need to follow up with PCP in 2-3 weeks post discharge 2. Please also check CBC to evaluate Hg and Hct levels  Discharge Diagnoses:  Active Problems:   Asystole   Cardiopulmonary arrest   ARDS (adult respiratory distress syndrome)   Encounter for central line placement   Cardiac arrest   Respiratory failure   Hypoxia   Leukocytosis  Discharge Condition: Stable  Diet recommendation: Renal diet    Brief narrative:    59 year old male, [name] (will not take blood products) , ESRD on HD had missed his last 2 dialysis treatments. 7/01 early AM he developed acute onset SOB and then became unresponsive. EMS noted asystole and began CPR. Total downtime estimated at about 20 mins. In ED he had several runs of VT, but was otherwise hemodynamically stable on ventilator. PCCM admitted   STUDIES:  7/01 echo EF 20%, grade 2 DD  SIG EVENTS: 7/01 arrest, cooling started 7/02 HD, rewarm 7/09 extubated 7/13 reintubated 7/13 septic shock requiring vasopressors & atrial fibrillation with RVR 7/16 to Tele  Assessment/Plan:   Acute hypoxic respiratory failure with flash pulmonary edema, acute on chronic combined CHF - in pt with known OSA/OHS and non compliance with CPAP - ETT 7/1 >> 08/16/14, 7/4 (self extubated and reintubated) >>08/21/14, 7/12 >>> 7/14 - respiratory status stable this AM  Septic shock secondary to tracheobronchitis, cultures positive for E. 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  Anemia of chronic illness, ESRD - Jehovah's witness - no signs of active bleeding   DM type II with complications of nephropathy  - SSI - hold Januvia for now   Acute anoxic encephalopathy, metabolic and from sepsis and cardiac arrest  - secondary to the above - resolved.  Obesity - Body mass index is 33.91 kg/(m^2)  Code Status: Full.  Family Communication: plan of care discussed with the patient Disposition Plan: Home  IV access:  Peripheral IV  Procedures and diagnostic studies:    Imaging Results    Dg Thoracic Spine W/swimmers  07/31/2014 CLINICAL DATA: Pain following fall EXAM: THORACIC SPINE - 2 VIEW + SWIMMERS COMPARISON: Chest radiograph Jun 18, 2013 FINDINGS: Frontal, lateral, and swimmer's views obtained. There is no fracture or spondylolisthesis. There is mild disc space narrowing at several levels. There are multiple anterior and right lateral osteophytes. No erosive change. IMPRESSION: Areas of osteoarthritic change. No fracture or spondylolisthesis. Electronically Signed By: Lowella Grip III M.D. On: 07/31/2014 20:27   Dg Lumbar Spine Complete  07/31/2014 CLINICAL DATA: 59 year old male who fell yesterday. Thoracolumbar pain. Initial encounter. EXAM: LUMBAR SPINE - COMPLETE 4+ VIEW COMPARISON: Thoracic series from today reported separately. FINDINGS: Normal lumbar segmentation. Compression deformity of  the superior endplate of L3 which has an indistinct appearance favoring acute injury. Straightening of lumbar lordosis. Other low lumbar vertebrae appear intact ; chronic or congenital wedging of L1 and L2 suspected. Superimposed disc space loss and  bulky endplate osteophytosis at L4-L5. No pars fracture. Sacral ala and SI joints within normal limits. Right total hip arthroplasty partially visible. IMPRESSION: 1. Acute mild L3 compression fracture suspected. If specific therapy such as vertebroplasty is desired, Lumbar MRI or Nuclear Medicine Whole-body Bone Scan would best confirm. 2. Lower lumbar disc and endplate degeneration. Electronically Signed By: Genevie Ann M.D. On: 07/31/2014 20:35   Dg Abd 1 View  08/24/2014 CLINICAL DATA: 59 year old male with enteric tube placement. EXAM: ABDOMEN - 1 VIEW COMPARISON: Radiograph dated 08/16/2014 FINDINGS: An enteric tubes noted extending into the epigastric area with tip to the right of the spine likely in the distal stomach or gastroduodenal junction. Air-filled loops of bowel noted in the pelvis. There is degenerative changes of the spine. Screws of the right hip arthroplasty are partially noted. IMPRESSION: Enteric tube with tip in the distal stomach Electronically Signed By: Anner Crete M.D. On: 08/24/2014 17:55   Ct Head Wo Contrast  08/23/2014 CLINICAL DATA: Left arm and left leg weakness. Missed the last 2 dialysis treatments. In the morning of 08/13/2014 he developed acute onset shortness of breath and became unresponsive affiliated EMS began CPR. Total Downtime estimated at 20 minutes. EXAM: CT HEAD WITHOUT CONTRAST TECHNIQUE: Contiguous axial images were obtained from the base of the skull through the vertex without intravenous contrast. COMPARISON: 08/13/2014 FINDINGS: Diffuse cerebral atrophy. Mild ventricular dilatation consistent with central atrophy. Low-attenuation changes in the deep white matter consistent with small vessel ischemia. No mass effect or midline shift. No abnormal extra-axial fluid collections. Gray-white matter junctions are distinct. Basal cisterns are not effaced. No evidence of acute intracranial hemorrhage. No depressed skull fractures.  Postoperative changes in the paranasal sinuses. Mucosal thickening in the maxillary antra. Mastoid air cells are not opacified. Ectatic and calcified intracranial internal carotid arteries. IMPRESSION: No acute intracranial abnormalities. Chronic atrophy and small vessel ischemic changes. Vascular atherosclerotic changes. Electronically Signed By: Lucienne Capers M.D. On: 08/23/2014 01:26   Ct Head Wo Contrast  08/13/2014 CLINICAL DATA: Initial evaluation for possible seizure EXAM: CT HEAD WITHOUT CONTRAST TECHNIQUE: Contiguous axial images were obtained from the base of the skull through the vertex without intravenous contrast. COMPARISON: Prior study from 07/28/2013 FINDINGS: Mild atrophy again noted. Patchy hypodensity within the periventricular and deep white matter both cerebral hemispheres most consistent with chronic small vessel ischemic disease, similar to previous. Prominent atheromatous calcifications present within the carotid siphons and distal vertebral arteries. No acute large vessel territory infarct. No intracranial hemorrhage. No mass lesion, midline shift, or mass effect. No extra-axial fluid collection No hydrocephalus. No extra-axial fluid collection. No acute abnormality about the orbits. Scattered mucosal thickening present throughout the paranasal sinuses with sparing of the frontal sinuses. Fluid present within the nasopharynx. Endotracheal tube partially visualized. No mastoid effusion. Calvarium intact. IMPRESSION: 1. No acute intracranial process. 2. Moderate chronic microvascular ischemic disease, similar to previous. Electronically Signed By: Jeannine Boga M.D. On: 08/13/2014 04:51   Dg Chest Port 1 View  08/24/2014 CLINICAL DATA: Respiratory failure and status post intubation and placement of new central line. EXAM: PORTABLE CHEST - 1 VIEW COMPARISON: Film earlier today at 1340 hours FINDINGS: Endotracheal tube is been placed with the tip  approximately 2 cm above the carina. In addition to the pre-existing left subclavian  central line, a new left jugular central line has been placed with the tip located in the midline in the expected position of the brachiocephalic vein. No pneumothorax. Aeration of both lower lung zones has improved since the prior study with some mild residual airspace opacity remaining at both bases. No edema, pneumothorax or significant pleural fluid identified. Stable cardiac enlargement. IMPRESSION: Endotracheal tube tip approximately 2 cm above the carina. Newly placed left jugular central line tip is in the brachiocephalic vein. Improved aeration of both lower lung zones since the prior study. Electronically Signed By: Aletta Edouard M.D. On: 08/24/2014 17:55   Dg Chest Port 1 View  08/24/2014 CLINICAL DATA: Right-sided chest pain, elevated white blood cell count. EXAM: PORTABLE CHEST - 1 VIEW COMPARISON: Portable chest x-ray of August 22, 2014 FINDINGS: Positioning is more straight AP today. The lungs are adequately inflated. There remain increased interstitial lung markings at both bases. The cardiac silhouette remains enlarged. The central pulmonary vascularity is engorged and indistinct. There is a left subclavian venous catheter whose tip projects over the junction of the right and left brachiocephalic veins. The bony thorax is unremarkable. IMPRESSION: 1. Persistent lower lobe increased interstitial markings which may reflect interstitial edema or less likely interstitial pneumonia. 2. Mild cardiomegaly and pulmonary vascular congestion consistent with CHF. Electronically Signed By: David Martinique M.D. On: 08/24/2014 13:58   Dg Chest Port 1 View  08/22/2014 CLINICAL DATA: Adult respiratory distress syndrome. Recent cardiac arrest and respiratory failure. Recent extubation. EXAM: PORTABLE CHEST - 1 VIEW COMPARISON: 08/20/2014 FINDINGS: Endotracheal tube and nasogastric tube have been  removed. Left subclavian central venous catheter remains in appropriate position. No pneumothorax visualized. Decreased bibasilar atelectasis is seen compared to previous study. Cardiomegaly and diffuse pulmonary interstitial prominence remain stable. No evidence of pulmonary consolidation. IMPRESSION: Decreased bibasilar atelectasis. Stable cardiomegaly and diffuse pulmonary interstitial prominence. Electronically Signed By: Earle Gell M.D. On: 08/22/2014 07:51   Dg Chest Port 1 View  08/20/2014 CLINICAL DATA: CHF EXAM: PORTABLE CHEST - 1 VIEW COMPARISON: 08/19/2014 FINDINGS: The endotracheal tube tip remains just below the clavicular heads. The orogastric tube crosses the diaphragm at least. Stable left subclavian central line, tip at the upper SVC. No change in cardiomegaly and vascular pedicle widening. Mediastinal contours are distorted by rightward rotation. Poor inflation with persistent indistinct somewhat streaky bibasilar opacities. Pulmonary venous congestion. IMPRESSION: 1. Stable positioning of tubes and central line. 2. Unchanged bibasilar atelectasis or pneumonia. 3. Stable cardiopericardial enlargement and pulmonary venous congestion. Electronically Signed By: Monte Fantasia M.D. On: 08/20/2014 06:15   Dg Chest Port 1 View  08/19/2014 CLINICAL DATA: Respiratory failure. EXAM: PORTABLE CHEST - 1 VIEW COMPARISON: 08/17/2014. FINDINGS: Endotracheal tube, left subclavian line, and NG tube in good anatomic position. Cardiomegaly with partial clearing of bilateral pulmonary infiltrates and pleural effusions. These findings consistent with improving congestive heart failure. No pneumothorax. IMPRESSION: 1. Lines and tubes in stable position. 2. Cardiomegaly with improvement of bilateral pulmonary infiltrates and pleural effusions suggesting partial clearing of congestive heart failure. Basilar pneumonia cannot be excluded. Electronically Signed By: [place] On: 08/19/2014 07:30   Dg Chest Port 1 View  08/17/2014 CLINICAL DATA: Cardiac arrest EXAM: PORTABLE CHEST - 1 VIEW COMPARISON: 08/16/2014 FINDINGS: The endotracheal tube is in good position, with tip between the clavicular heads and carina. The orogastric tube enters the stomach at least. Stable left subclavian central line, tip near the upper SVC. Worsening opacification of the lower chest compatible with airspace disease. There may be superimposed layering  fluid. No air leak or pulmonary edema in the apical lungs. Stable cardiomegaly. IMPRESSION: 1. Stable positioning of tubes and central line. 2. Progression of extensive airspace disease, potentially aspiration pneumonia given the history. Electronically Signed By: Monte Fantasia M.D. On: 08/17/2014 06:00   Dg Chest Port 1 View  08/16/2014 CLINICAL DATA: Hypoxia. OG tube placement. Re-intubation. EXAM: PORTABLE CHEST - 1 VIEW COMPARISON: 08/15/2014 FINDINGS: Endotracheal tube 2.6 cm above the carina. NG tube is in the stomach. Left central line tip is in the SVC, unchanged. There is cardiomegaly. Low lung volumes with diffuse bilateral airspace opacities. Possible small effusions. No acute bony abnormality. IMPRESSION: Low lung volumes with diffuse bilateral airspace disease. Endotracheal tube 2.6 cm above the carina. Electronically Signed By: Rolm Baptise M.D. On: 08/16/2014 10:54   Dg Chest Port 1 View  08/15/2014 CLINICAL DATA: Shortness of breath, unresponsive. Post CPR. ARDS. EXAM: PORTABLE CHEST - 1 VIEW COMPARISON: 08/14/2014 FINDINGS: Support devices are stable. Cardiomegaly. Bilateral airspace opacities are noted, slightly improved on the left since prior study. No change on the right. Possible small bilateral effusions. No acute bony abnormality. IMPRESSION: Diffuse bilateral airspace disease with some improvement on the left since prior study. Electronically Signed By: Rolm Baptise  M.D. On: 08/15/2014 07:41   Dg Chest Port 1 View  08/14/2014 CLINICAL DATA: Cardiac arrest. Now 1 caudally Ing protocol. EXAM: PORTABLE CHEST - 1 VIEW COMPARISON: 08/13/2014 FINDINGS: Endotracheal tube is 3.8 cm above the carina. The left subclavian central line extends into the SVC. The nasogastric tube extends into the stomach. Parenchymal lung opacities are present bilaterally with slight interval improvement in the right lung. IMPRESSION: Support equipment appears satisfactorily positioned. Slight improvement in right lung opacities, now with better aeration. Multifocal opacities persist, particularly in the left lung. Electronically Signed By: Andreas Newport M.D. On: 08/14/2014 05:35   Dg Chest Port 1 View  08/13/2014 CLINICAL DATA: Central line placement. EXAM: PORTABLE CHEST - 1 VIEW COMPARISON: Radiographs 08/13/2014. FINDINGS: 1024 hr. The endotracheal tube remains low, approximately 2.2 cm above the carina. There is a new left subclavian central venous catheter projecting to the mid SVC level. Nasogastric tube projects below the diaphragm, tip not visualized. There has been significant worsening of bilateral airspace opacities, worse on the right. There is some associated volume loss. The heart is enlarged. No pneumothorax or large pleural effusion identified. IMPRESSION: Central line placement as described without complicating pneumothorax. The additional support system is unchanged; the endotracheal tube remains low. Significant interval worsening of bilateral airspace opacities worrisome for aspiration. Electronically Signed By: Richardean Sale M.D. On: 08/13/2014 10:41   Dg Chest Portable 1 View  08/13/2014 CLINICAL DATA: Endotracheal intubation EXAM: PORTABLE CHEST - 1 VIEW COMPARISON: 08/13/2014 at 01:31 FINDINGS: Endotracheal tube is 2.5 cm above the carina. Nasogastric tube extends into the stomach. Linear left base opacities persist without  significant interval change. Right lung is grossly clear. There is no large effusion. There is no pneumothorax. IMPRESSION: Support equipment appears satisfactorily positioned. No significant interval change in the linear left base opacities. Electronically Signed By: Andreas Newport M.D. On: 08/13/2014 02:03   Dg Chest Port 1 View  08/13/2014 CLINICAL DATA: Status post CPR status post intubation EXAM: PORTABLE CHEST - 1 VIEW COMPARISON: 01/29/2014 FINDINGS: Cardiac shadow is enlarged. An endotracheal tube is now seen at the level of the aortic knob in satisfactory position. A nasogastric catheter is noted extending into the distal esophagus. Its tip is not visualized on this exam. The lungs are well  aerated. Slight increased density is noted in the left base although this may be in part due to overlying extrinsic artifact. No acute bony abnormality is seen. IMPRESSION: Tubes and lines as described above. Possible early left basilar infiltrate. Electronically Signed By: Inez Catalina M.D. On: 08/13/2014 01:41   Dg Knee Complete 4 Views Left  07/31/2014 CLINICAL DATA: Trip and fall injury last night. Landed on right side. Generalized left knee pain and bilateral hip pain. Difficulty bearing weight on the left knee. EXAM: LEFT KNEE - COMPLETE 4+ VIEW COMPARISON: None. FINDINGS: Prominent tricompartment degenerative changes in the left knee with bone on bone appearance in all 3 compartments, greatest laterally. Associated hypertrophic osteophytes. No evidence of acute fracture or dislocation. No focal bone lesion or bone destruction. No significant effusion. Extensive vascular calcifications. IMPRESSION: Prominent tricompartment degenerative changes in the left knee. No acute bony abnormalities. Electronically Signed By: Lucienne Capers M.D. On: 07/31/2014 22:21   Dg Abd Portable 1v  08/16/2014 CLINICAL DATA: OG tube placement EXAM: PORTABLE ABDOMEN - 1 VIEW  COMPARISON: 08/13/2014 FINDINGS: OG tube tip is in the proximal to mid stomach. Nonobstructive bowel gas pattern. IMPRESSION: Enteric tube tip in the proximal to mid stomach. Electronically Signed By: Rolm Baptise M.D. On: 08/16/2014 10:56   Dg Abd Portable 1v  08/13/2014 CLINICAL DATA: Encounter for orogastric tube placement. EXAM: PORTABLE ABDOMEN - 1 VIEW COMPARISON: July 31, 2014. FINDINGS: Distal tip of orogastric tube is seen in the expected position of the distal stomach or proximal duodenum. No abnormal bowel gas pattern is noted. Bilateral diffuse lung opacities are noted. IMPRESSION: Distal tip of orogastric tube is seen in the expected position of the distal stomach or proximal duodenum. No evidence of bowel obstruction or ileus is noted. Electronically Signed By: Marijo Conception, M.D. On: 08/13/2014 10:39   Dg Hips Bilat With Pelvis 3-4 Views  07/31/2014 CLINICAL DATA: Bilateral hip pain after slip and fall injury. EXAM: BILATERAL HIP (WITH PELVIS) 3-4 VIEWS COMPARISON: None. FINDINGS: Views of both hips are obtained. The right hip demonstrates previous right total hip arthroplasty using non cemented femoral component. Components appear well seated. No evidence of acute fracture or dislocation. Left hip demonstrates mild degenerative changes with superior acetabular joint space narrowing and sclerosis and osteophytes on both the femoral and acetabular surfaces. No evidence of acute fracture or dislocation. Visualized pelvis is unremarkable although the entire pelvis is not included. Vascular calcifications. IMPRESSION: The right total hip arthroplasty. Components appear well seated. Degenerative changes in the left hip. No acute fractures identified. Electronically Signed By: Lucienne Capers M.D. On: 07/31/2014 22:22     Medical Consultants:  Nephrology   Other Consultants:  PT  IAnti-Infectives:   Rocephin course competed        Discharge Exam: Filed Vitals:   08/31/14 1006  BP: 123/76  Pulse: 97  Temp:   Resp: 23   Filed Vitals:   08/31/14 0836 08/31/14 0906 08/31/14 0936 08/31/14 1006  BP: 98/65 147/84 126/82 123/76  Pulse: 109 83 95 97  Temp:      TempSrc:      Resp:  23 23 23   Height:      Weight:      SpO2:        General: Pt is alert, follows commands appropriately, not in acute distress Cardiovascular: Regular rate and rhythm, S1/S2 +, no rubs, no gallops Respiratory: Clear to auscultation bilaterally, no wheezing, no crackles, no rhonchi Abdominal: Soft, non tender, non distended, bowel sounds +, no  guarding  Discharge Instructions  Discharge Instructions    Diet - low sodium heart healthy    Complete by:  As directed      Increase activity slowly    Complete by:  As directed             Medication List    STOP taking these medications        TraMADol HCl 300 MG Cp24     valsartan 40 MG tablet  Commonly known as:  DIOVAN      TAKE these medications        acetaminophen 650 MG CR tablet  Commonly known as:  TYLENOL  Take 650 mg by mouth every 8 (eight) hours as needed for pain.     allopurinol 100 MG tablet  Commonly known as:  ZYLOPRIM  TAKE ONE TABLET BY MOUTH TWICE DAILY     amiodarone 200 MG tablet  Commonly known as:  PACERONE  Take 1 tablet (200 mg total) by mouth daily.     amitriptyline 50 MG tablet  Commonly known as:  ELAVIL  TAKE ONE TABLET BY MOUTH ONCE DAILY AT BEDTIME     carvedilol 25 MG tablet  Commonly known as:  COREG  Take 25 mg by mouth 2 (two) times daily with a meal.     cetirizine 10 MG tablet  Commonly known as:  ZYRTEC  Take 1 tablet (10 mg total) by mouth daily.     cholestyramine 4 G packet  Commonly known as:  QUESTRAN  Take 1 packet (4 g total) by mouth 2 (two) times daily.     docusate sodium 100 MG capsule  Commonly known as:  COLACE  Take 100 mg by mouth daily.     hydrOXYzine 25 MG tablet  Commonly known as:   ATARAX/VISTARIL  Take 1 tablet (25 mg total) by mouth 3 (three) times daily as needed for itching.     JANUVIA 25 MG tablet  Generic drug:  sitaGLIPtin  TAKE ONE TABLET BY MOUTH ONCE DAILY     loperamide 2 MG tablet  Commonly known as:  IMODIUM A-D  Take 2 mg by mouth 4 (four) times daily as needed for diarrhea or loose stools.     ondansetron 4 MG tablet  Commonly known as:  ZOFRAN  Take 1 tablet (4 mg total) by mouth every 6 (six) hours.     OVER THE COUNTER MEDICATION  1 application 2 (two) times daily. Melaquin Complex     ranitidine 150 MG tablet  Commonly known as:  ZANTAC  Take 150 mg by mouth daily as needed for heartburn.     sevelamer carbonate 800 MG tablet  Commonly known as:  RENVELA  Take 1,600 mg by mouth 3 (three) times daily with meals.     simvastatin 20 MG tablet  Commonly known as:  ZOCOR  TAKE ONE TABLET BY MOUTH ONCE DAILY     sodium chloride 0.65 % Soln nasal spray  Commonly known as:  OCEAN  Place 1 spray into both nostrils as needed for congestion.     tadalafil 20 MG tablet  Commonly known as:  CIALIS  Take 1 tablet (20 mg total) by mouth daily as needed for erectile dysfunction.     Vitamin D (Ergocalciferol) 50000 UNITS Caps capsule  Commonly known as:  DRISDOL  Take 1 capsule (50,000 Units total) by mouth every 7 (seven) days.     warfarin 3 MG tablet  Commonly known as:  COUMADIN  Take 1-1.5 tablets (3-4.5 mg total) by mouth See admin instructions. Please take 4.5 mg today 08/31/2014 and then continue your previous home regimen where you take 3 mg tablet daily except on Thursdays when you take 4.5 mg tablet.            Follow-up Information    Follow up with Cathlean Cower, MD.   Specialties:  Internal Medicine, Radiology   Contact information:   [place] [address] [phone]       Call Faye Ramsay, MD.   Specialty:  Internal Medicine   Why:  As needed   Contact information:   [address] [place] [address] [phone]        The results of significant diagnostics from this hospitalization (including imaging, microbiology, ancillary and laboratory) are listed below for reference.     Microbiology: Recent Results (from the past 240 hour(s))  Culture, blood (routine x 2)     Status: None   Collection Time: 08/24/14 11:40 AM  Result Value Ref Range Status   Specimen Description BLOOD HEMODIALYSIS CATHETER  Final   Special Requests BOTTLES DRAWN AEROBIC AND ANAEROBIC 10CC  Final   Culture NO GROWTH 5 DAYS  Final   Report Status 08/29/2014 FINAL  Final  Culture, blood (routine x 2)     Status: None   Collection Time: 08/24/14 12:10 PM  Result Value Ref Range Status   Specimen Description BLOOD HEMODIALYSIS CATHETER  Final   Special Requests BOTTLES DRAWN AEROBIC AND ANAEROBIC 10CC  Final   Culture NO GROWTH 5 DAYS  Final   Report Status 08/29/2014 FINAL  Final  Culture, blood (x 2)     Status: None   Collection Time: 08/24/14  5:44 PM  Result Value Ref Range Status   Specimen Description BLOOD CENTRAL LINE  Final   Special Requests IN PEDIATRIC BOTTLE 4CC  Final   Culture NO GROWTH 5 DAYS  Final   Report Status 08/29/2014 FINAL  Final  Culture, blood (x 2)     Status: None   Collection Time: 08/24/14  6:50 PM  Result Value Ref Range Status   Specimen Description BLOOD RIGHT HAND  Final   Special Requests IN PEDIATRIC BOTTLE 3CC  Final   Culture NO GROWTH 5 DAYS  Final   Report Status 08/29/2014 FINAL  Final  Culture, respiratory (NON-Expectorated)     Status: None   Collection Time: 08/24/14  6:53 PM  Result Value Ref Range Status   Specimen Description TRACHEAL ASPIRATE  Final   Special Requests NONE  Final   Gram Stain   Final    RARE WBC PRESENT, PREDOMINANTLY MONONUCLEAR RARE SQUAMOUS EPITHELIAL CELLS PRESENT NO ORGANISMS SEEN Performed at [facility]    Culture   Final    FEW ESCHERICHIA COLI Performed at [facility]    Report Status 08/27/2014 FINAL  Final   Organism ID, Bacteria ESCHERICHIA COLI  Final      Susceptibility   Escherichia coli - MIC*    AMPICILLIN <=2 SENSITIVE Sensitive     AMPICILLIN/SULBACTAM <=2 SENSITIVE Sensitive     CEFAZOLIN <=4 SENSITIVE Sensitive     CEFEPIME <=1 SENSITIVE Sensitive     CEFTAZIDIME <=1 SENSITIVE Sensitive     CEFTRIAXONE <=1 SENSITIVE Sensitive     CIPROFLOXACIN <=0.25 SENSITIVE Sensitive     GENTAMICIN <=1 SENSITIVE Sensitive     IMIPENEM <=0.25 SENSITIVE Sensitive  PIP/TAZO <=4 SENSITIVE Sensitive     TOBRAMYCIN <=1 SENSITIVE Sensitive     TRIMETH/SULFA <=20 SENSITIVE Sensitive     * FEW ESCHERICHIA COLI  Culture, Urine     Status: None   Collection Time: 08/25/14  6:41 AM  Result Value Ref Range Status   Specimen Description URINE, CATHETERIZED  Final   Special Requests NONE  Final   Culture 20,000 COLONIES/mL ESCHERICHIA COLI  Final   Report Status 08/27/2014 FINAL  Final   Organism ID, Bacteria ESCHERICHIA COLI  Final      Susceptibility   Escherichia coli - MIC*    AMPICILLIN <=2 SENSITIVE Sensitive     CEFAZOLIN <=4 SENSITIVE Sensitive     CEFTRIAXONE <=1 SENSITIVE Sensitive     CIPROFLOXACIN <=0.25 SENSITIVE Sensitive     GENTAMICIN <=1 SENSITIVE Sensitive     IMIPENEM <=0.25 SENSITIVE Sensitive     NITROFURANTOIN <=16 SENSITIVE Sensitive     TRIMETH/SULFA <=20 SENSITIVE Sensitive     AMPICILLIN/SULBACTAM <=2 SENSITIVE Sensitive     PIP/TAZO <=4 SENSITIVE Sensitive     * 20,000 COLONIES/mL ESCHERICHIA COLI     Labs: Basic Metabolic Panel:  Recent Labs Lab 08/25/14 0520 08/26/14 0444 08/26/14 1400 08/27/14 0400 08/28/14 0500 08/29/14 0618 08/30/14 0444 08/31/14 0415  NA 132* 131* 139 136 135 134* 137 132*  K 4.4 3.6 3.7 4.2 4.3 3.7 3.9 3.5  CL 97* 97* 104 101 101 95* 98* 97*  CO2 23 21* 22 22 22 28 27 22   GLUCOSE 178* 80 103* 98 143* 149* 152* 142*  BUN 34* 41* 24* 32* 43* 20 28* 37*  CREATININE 5.62* 7.36*  5.07* 6.87* 8.00* 5.44* 7.13* 8.19*  CALCIUM 8.3* 8.6* 8.8* 9.0 9.5 8.9 9.4 8.7*  MG 1.8 1.7 1.8 2.2 2.3  --   --   --   PHOS 4.4 4.7* 3.6 4.7* 3.5 3.2 3.4 3.8   Liver Function Tests:  Recent Labs Lab 08/24/14 1736 08/25/14 0520  08/27/14 0400 08/28/14 0500 08/29/14 0618 08/30/14 0444 08/31/14 0415  AST 33 30  --   --   --   --   --   --   ALT 22 22  --   --   --   --   --   --   ALKPHOS 306* 193*  --   --   --   --   --   --   BILITOT 1.6* 1.1  --   --   --   --   --   --   PROT 6.3* 5.6*  --   --   --   --   --   --   ALBUMIN 2.4* 2.0*  < > 2.0* 2.3* 2.3* 2.4* 2.3*  < > = values in this interval not displayed. CBC:  Recent Labs Lab 08/26/14 0443 08/27/14 0400 08/28/14 0500 08/29/14 0618 08/30/14 0444 08/31/14 0400  WBC 19.5* 14.8* 11.7* 7.0 7.5 7.6  NEUTROABS 16.9* 11.9* 7.8*  --   --   --   HGB 8.8* 9.5* 10.8* 10.9* 10.6* 10.1*  HCT 27.5* 28.8* 32.6* 33.3* 33.3* 31.9*  MCV 90.8 89.2 89.8 91.2 90.7 91.4  PLT 233 251 291 253 292 290   Cardiac Enzymes:  Recent Labs Lab 08/24/14 1736 08/25/14 08/25/14 0520  TROPONINI 0.07* 0.07* 0.06*    ProBNP (last 3 results)  Recent Labs  01/29/14 0748  PROBNP 7824.0*    CBG:  Recent Labs Lab 08/29/14 2048 08/30/14 [id number]  08/30/14 1107 08/30/14 1600 08/30/14 2041  GLUCAP 191* 159* 175* 145* 225*   SIGNED: Time coordinating discharge: 30 minutes  MAGICK-Candies Palm, MD  [facility] 08/31/2014, 10:49 AM Pager [phone]  If 7PM-7AM, please contact night-coverage [email or web address] Password [email or web address]

## 2014-08-31 NOTE — Progress Notes (Signed)
Occupational Therapy cancellation Note  Pt currently in HD.  Will reattempt.  Lucille Passy, OTR/L [phone]

## 2014-09-02 DIAGNOSIS — E876 Hypokalemia: Secondary | ICD-10-CM | POA: Diagnosis not present

## 2014-09-02 DIAGNOSIS — N2581 Secondary hyperparathyroidism of renal origin: Secondary | ICD-10-CM | POA: Diagnosis not present

## 2014-09-02 DIAGNOSIS — D631 Anemia in chronic kidney disease: Secondary | ICD-10-CM | POA: Diagnosis not present

## 2014-09-02 DIAGNOSIS — N186 End stage renal disease: Secondary | ICD-10-CM | POA: Diagnosis not present

## 2014-09-02 DIAGNOSIS — D509 Iron deficiency anemia, unspecified: Secondary | ICD-10-CM | POA: Diagnosis not present

## 2014-09-04 DIAGNOSIS — N2581 Secondary hyperparathyroidism of renal origin: Secondary | ICD-10-CM | POA: Diagnosis not present

## 2014-09-04 DIAGNOSIS — D509 Iron deficiency anemia, unspecified: Secondary | ICD-10-CM | POA: Diagnosis not present

## 2014-09-04 DIAGNOSIS — N186 End stage renal disease: Secondary | ICD-10-CM | POA: Diagnosis not present

## 2014-09-04 DIAGNOSIS — D631 Anemia in chronic kidney disease: Secondary | ICD-10-CM | POA: Diagnosis not present

## 2014-09-04 DIAGNOSIS — E876 Hypokalemia: Secondary | ICD-10-CM | POA: Diagnosis not present

## 2014-09-07 DIAGNOSIS — D631 Anemia in chronic kidney disease: Secondary | ICD-10-CM | POA: Diagnosis not present

## 2014-09-07 DIAGNOSIS — E876 Hypokalemia: Secondary | ICD-10-CM | POA: Diagnosis not present

## 2014-09-07 DIAGNOSIS — N186 End stage renal disease: Secondary | ICD-10-CM | POA: Diagnosis not present

## 2014-09-07 DIAGNOSIS — D509 Iron deficiency anemia, unspecified: Secondary | ICD-10-CM | POA: Diagnosis not present

## 2014-09-07 DIAGNOSIS — N2581 Secondary hyperparathyroidism of renal origin: Secondary | ICD-10-CM | POA: Diagnosis not present

## 2014-09-08 ENCOUNTER — Ambulatory Visit: Payer: Medicare Other | Admitting: Internal Medicine

## 2014-09-08 DIAGNOSIS — Z0289 Encounter for other administrative examinations: Secondary | ICD-10-CM

## 2014-09-09 ENCOUNTER — Ambulatory Visit: Payer: Medicare Other | Admitting: Internal Medicine

## 2014-09-09 DIAGNOSIS — I4891 Unspecified atrial fibrillation: Secondary | ICD-10-CM | POA: Diagnosis not present

## 2014-09-09 DIAGNOSIS — D631 Anemia in chronic kidney disease: Secondary | ICD-10-CM | POA: Diagnosis not present

## 2014-09-09 DIAGNOSIS — N186 End stage renal disease: Secondary | ICD-10-CM | POA: Diagnosis not present

## 2014-09-09 DIAGNOSIS — E876 Hypokalemia: Secondary | ICD-10-CM | POA: Diagnosis not present

## 2014-09-09 DIAGNOSIS — D509 Iron deficiency anemia, unspecified: Secondary | ICD-10-CM | POA: Diagnosis not present

## 2014-09-09 DIAGNOSIS — N2581 Secondary hyperparathyroidism of renal origin: Secondary | ICD-10-CM | POA: Diagnosis not present

## 2014-09-10 LAB — POCT INR: INR: 1.56

## 2014-09-11 DIAGNOSIS — D631 Anemia in chronic kidney disease: Secondary | ICD-10-CM | POA: Diagnosis not present

## 2014-09-11 DIAGNOSIS — D509 Iron deficiency anemia, unspecified: Secondary | ICD-10-CM | POA: Diagnosis not present

## 2014-09-11 DIAGNOSIS — N186 End stage renal disease: Secondary | ICD-10-CM | POA: Diagnosis not present

## 2014-09-11 DIAGNOSIS — E876 Hypokalemia: Secondary | ICD-10-CM | POA: Diagnosis not present

## 2014-09-11 DIAGNOSIS — N2581 Secondary hyperparathyroidism of renal origin: Secondary | ICD-10-CM | POA: Diagnosis not present

## 2014-09-12 DIAGNOSIS — I120 Hypertensive chronic kidney disease with stage 5 chronic kidney disease or end stage renal disease: Secondary | ICD-10-CM | POA: Diagnosis not present

## 2014-09-12 DIAGNOSIS — N186 End stage renal disease: Secondary | ICD-10-CM | POA: Diagnosis not present

## 2014-09-12 DIAGNOSIS — Z992 Dependence on renal dialysis: Secondary | ICD-10-CM | POA: Diagnosis not present

## 2014-09-14 ENCOUNTER — Ambulatory Visit (INDEPENDENT_AMBULATORY_CARE_PROVIDER_SITE_OTHER): Payer: Medicare Other | Admitting: General Practice

## 2014-09-14 DIAGNOSIS — E1129 Type 2 diabetes mellitus with other diabetic kidney complication: Secondary | ICD-10-CM | POA: Diagnosis not present

## 2014-09-14 DIAGNOSIS — D509 Iron deficiency anemia, unspecified: Secondary | ICD-10-CM | POA: Diagnosis not present

## 2014-09-14 DIAGNOSIS — I270 Primary pulmonary hypertension: Secondary | ICD-10-CM

## 2014-09-14 DIAGNOSIS — N186 End stage renal disease: Secondary | ICD-10-CM | POA: Diagnosis not present

## 2014-09-14 DIAGNOSIS — N2581 Secondary hyperparathyroidism of renal origin: Secondary | ICD-10-CM | POA: Diagnosis not present

## 2014-09-14 DIAGNOSIS — I639 Cerebral infarction, unspecified: Secondary | ICD-10-CM

## 2014-09-14 DIAGNOSIS — D631 Anemia in chronic kidney disease: Secondary | ICD-10-CM | POA: Diagnosis not present

## 2014-09-14 DIAGNOSIS — Z5181 Encounter for therapeutic drug level monitoring: Secondary | ICD-10-CM

## 2014-09-14 DIAGNOSIS — E876 Hypokalemia: Secondary | ICD-10-CM | POA: Diagnosis not present

## 2014-09-14 DIAGNOSIS — I272 Pulmonary hypertension, unspecified: Secondary | ICD-10-CM

## 2014-09-14 NOTE — Progress Notes (Signed)
I have reviewed and agree with the plan. 

## 2014-09-14 NOTE — Progress Notes (Signed)
Pre visit review using our clinic review tool, if applicable. No additional management support is needed unless otherwise documented below in the visit note. 

## 2014-09-16 DIAGNOSIS — E876 Hypokalemia: Secondary | ICD-10-CM | POA: Diagnosis not present

## 2014-09-16 DIAGNOSIS — E1129 Type 2 diabetes mellitus with other diabetic kidney complication: Secondary | ICD-10-CM | POA: Diagnosis not present

## 2014-09-16 DIAGNOSIS — N186 End stage renal disease: Secondary | ICD-10-CM | POA: Diagnosis not present

## 2014-09-16 DIAGNOSIS — N2581 Secondary hyperparathyroidism of renal origin: Secondary | ICD-10-CM | POA: Diagnosis not present

## 2014-09-16 DIAGNOSIS — D631 Anemia in chronic kidney disease: Secondary | ICD-10-CM | POA: Diagnosis not present

## 2014-09-16 DIAGNOSIS — D509 Iron deficiency anemia, unspecified: Secondary | ICD-10-CM | POA: Diagnosis not present

## 2014-09-18 DIAGNOSIS — N186 End stage renal disease: Secondary | ICD-10-CM | POA: Diagnosis not present

## 2014-09-18 DIAGNOSIS — E876 Hypokalemia: Secondary | ICD-10-CM | POA: Diagnosis not present

## 2014-09-18 DIAGNOSIS — D631 Anemia in chronic kidney disease: Secondary | ICD-10-CM | POA: Diagnosis not present

## 2014-09-18 DIAGNOSIS — N2581 Secondary hyperparathyroidism of renal origin: Secondary | ICD-10-CM | POA: Diagnosis not present

## 2014-09-18 DIAGNOSIS — E1129 Type 2 diabetes mellitus with other diabetic kidney complication: Secondary | ICD-10-CM | POA: Diagnosis not present

## 2014-09-18 DIAGNOSIS — D509 Iron deficiency anemia, unspecified: Secondary | ICD-10-CM | POA: Diagnosis not present

## 2014-09-21 DIAGNOSIS — E876 Hypokalemia: Secondary | ICD-10-CM | POA: Diagnosis not present

## 2014-09-21 DIAGNOSIS — D509 Iron deficiency anemia, unspecified: Secondary | ICD-10-CM | POA: Diagnosis not present

## 2014-09-21 DIAGNOSIS — D631 Anemia in chronic kidney disease: Secondary | ICD-10-CM | POA: Diagnosis not present

## 2014-09-21 DIAGNOSIS — N2581 Secondary hyperparathyroidism of renal origin: Secondary | ICD-10-CM | POA: Diagnosis not present

## 2014-09-21 DIAGNOSIS — E1129 Type 2 diabetes mellitus with other diabetic kidney complication: Secondary | ICD-10-CM | POA: Diagnosis not present

## 2014-09-21 DIAGNOSIS — N186 End stage renal disease: Secondary | ICD-10-CM | POA: Diagnosis not present

## 2014-09-22 ENCOUNTER — Other Ambulatory Visit: Payer: Self-pay | Admitting: Family Medicine

## 2014-09-22 ENCOUNTER — Ambulatory Visit: Payer: Medicare Other | Admitting: Family Medicine

## 2014-09-23 DIAGNOSIS — N186 End stage renal disease: Secondary | ICD-10-CM | POA: Diagnosis not present

## 2014-09-23 DIAGNOSIS — N2581 Secondary hyperparathyroidism of renal origin: Secondary | ICD-10-CM | POA: Diagnosis not present

## 2014-09-23 DIAGNOSIS — E1129 Type 2 diabetes mellitus with other diabetic kidney complication: Secondary | ICD-10-CM | POA: Diagnosis not present

## 2014-09-23 DIAGNOSIS — E876 Hypokalemia: Secondary | ICD-10-CM | POA: Diagnosis not present

## 2014-09-23 DIAGNOSIS — D509 Iron deficiency anemia, unspecified: Secondary | ICD-10-CM | POA: Diagnosis not present

## 2014-09-23 DIAGNOSIS — D631 Anemia in chronic kidney disease: Secondary | ICD-10-CM | POA: Diagnosis not present

## 2014-09-23 NOTE — Telephone Encounter (Signed)
Refill done.  

## 2014-09-25 DIAGNOSIS — N2581 Secondary hyperparathyroidism of renal origin: Secondary | ICD-10-CM | POA: Diagnosis not present

## 2014-09-25 DIAGNOSIS — N186 End stage renal disease: Secondary | ICD-10-CM | POA: Diagnosis not present

## 2014-09-25 DIAGNOSIS — D509 Iron deficiency anemia, unspecified: Secondary | ICD-10-CM | POA: Diagnosis not present

## 2014-09-25 DIAGNOSIS — E876 Hypokalemia: Secondary | ICD-10-CM | POA: Diagnosis not present

## 2014-09-25 DIAGNOSIS — E1129 Type 2 diabetes mellitus with other diabetic kidney complication: Secondary | ICD-10-CM | POA: Diagnosis not present

## 2014-09-25 DIAGNOSIS — D631 Anemia in chronic kidney disease: Secondary | ICD-10-CM | POA: Diagnosis not present

## 2014-09-28 DIAGNOSIS — E876 Hypokalemia: Secondary | ICD-10-CM | POA: Diagnosis not present

## 2014-09-28 DIAGNOSIS — N2581 Secondary hyperparathyroidism of renal origin: Secondary | ICD-10-CM | POA: Diagnosis not present

## 2014-09-28 DIAGNOSIS — D509 Iron deficiency anemia, unspecified: Secondary | ICD-10-CM | POA: Diagnosis not present

## 2014-09-28 DIAGNOSIS — N186 End stage renal disease: Secondary | ICD-10-CM | POA: Diagnosis not present

## 2014-09-28 DIAGNOSIS — E1129 Type 2 diabetes mellitus with other diabetic kidney complication: Secondary | ICD-10-CM | POA: Diagnosis not present

## 2014-09-28 DIAGNOSIS — D631 Anemia in chronic kidney disease: Secondary | ICD-10-CM | POA: Diagnosis not present

## 2014-09-29 DIAGNOSIS — T82858D Stenosis of vascular prosthetic devices, implants and grafts, subsequent encounter: Secondary | ICD-10-CM | POA: Diagnosis not present

## 2014-09-29 DIAGNOSIS — I871 Compression of vein: Secondary | ICD-10-CM | POA: Diagnosis not present

## 2014-09-29 DIAGNOSIS — N186 End stage renal disease: Secondary | ICD-10-CM | POA: Diagnosis not present

## 2014-09-29 DIAGNOSIS — Z992 Dependence on renal dialysis: Secondary | ICD-10-CM | POA: Diagnosis not present

## 2014-09-30 DIAGNOSIS — N186 End stage renal disease: Secondary | ICD-10-CM | POA: Diagnosis not present

## 2014-09-30 DIAGNOSIS — E1129 Type 2 diabetes mellitus with other diabetic kidney complication: Secondary | ICD-10-CM | POA: Diagnosis not present

## 2014-09-30 DIAGNOSIS — D631 Anemia in chronic kidney disease: Secondary | ICD-10-CM | POA: Diagnosis not present

## 2014-09-30 DIAGNOSIS — N2581 Secondary hyperparathyroidism of renal origin: Secondary | ICD-10-CM | POA: Diagnosis not present

## 2014-09-30 DIAGNOSIS — D509 Iron deficiency anemia, unspecified: Secondary | ICD-10-CM | POA: Diagnosis not present

## 2014-09-30 DIAGNOSIS — E876 Hypokalemia: Secondary | ICD-10-CM | POA: Diagnosis not present

## 2014-10-01 ENCOUNTER — Ambulatory Visit: Payer: Medicare Other | Admitting: Internal Medicine

## 2014-10-01 ENCOUNTER — Encounter: Payer: Self-pay | Admitting: Internal Medicine

## 2014-10-01 ENCOUNTER — Ambulatory Visit: Payer: Medicare Other | Admitting: Family Medicine

## 2014-10-01 DIAGNOSIS — Z0289 Encounter for other administrative examinations: Secondary | ICD-10-CM

## 2014-10-02 ENCOUNTER — Other Ambulatory Visit: Payer: Self-pay | Admitting: Family Medicine

## 2014-10-02 DIAGNOSIS — E1129 Type 2 diabetes mellitus with other diabetic kidney complication: Secondary | ICD-10-CM | POA: Diagnosis not present

## 2014-10-02 DIAGNOSIS — D509 Iron deficiency anemia, unspecified: Secondary | ICD-10-CM | POA: Diagnosis not present

## 2014-10-02 DIAGNOSIS — N186 End stage renal disease: Secondary | ICD-10-CM | POA: Diagnosis not present

## 2014-10-02 DIAGNOSIS — D631 Anemia in chronic kidney disease: Secondary | ICD-10-CM | POA: Diagnosis not present

## 2014-10-02 DIAGNOSIS — E876 Hypokalemia: Secondary | ICD-10-CM | POA: Diagnosis not present

## 2014-10-02 DIAGNOSIS — N2581 Secondary hyperparathyroidism of renal origin: Secondary | ICD-10-CM | POA: Diagnosis not present

## 2014-10-04 NOTE — Telephone Encounter (Signed)
Refill denied. Per dr Tamala Julian, pt needs to follow-up with cardiology

## 2014-10-05 DIAGNOSIS — E876 Hypokalemia: Secondary | ICD-10-CM | POA: Diagnosis not present

## 2014-10-05 DIAGNOSIS — N2581 Secondary hyperparathyroidism of renal origin: Secondary | ICD-10-CM | POA: Diagnosis not present

## 2014-10-05 DIAGNOSIS — D631 Anemia in chronic kidney disease: Secondary | ICD-10-CM | POA: Diagnosis not present

## 2014-10-05 DIAGNOSIS — E1129 Type 2 diabetes mellitus with other diabetic kidney complication: Secondary | ICD-10-CM | POA: Diagnosis not present

## 2014-10-05 DIAGNOSIS — D509 Iron deficiency anemia, unspecified: Secondary | ICD-10-CM | POA: Diagnosis not present

## 2014-10-05 DIAGNOSIS — N186 End stage renal disease: Secondary | ICD-10-CM | POA: Diagnosis not present

## 2014-10-07 DIAGNOSIS — D509 Iron deficiency anemia, unspecified: Secondary | ICD-10-CM | POA: Diagnosis not present

## 2014-10-07 DIAGNOSIS — E876 Hypokalemia: Secondary | ICD-10-CM | POA: Diagnosis not present

## 2014-10-07 DIAGNOSIS — I4891 Unspecified atrial fibrillation: Secondary | ICD-10-CM | POA: Diagnosis not present

## 2014-10-07 DIAGNOSIS — N2581 Secondary hyperparathyroidism of renal origin: Secondary | ICD-10-CM | POA: Diagnosis not present

## 2014-10-07 DIAGNOSIS — N186 End stage renal disease: Secondary | ICD-10-CM | POA: Diagnosis not present

## 2014-10-07 DIAGNOSIS — D631 Anemia in chronic kidney disease: Secondary | ICD-10-CM | POA: Diagnosis not present

## 2014-10-07 DIAGNOSIS — E1129 Type 2 diabetes mellitus with other diabetic kidney complication: Secondary | ICD-10-CM | POA: Diagnosis not present

## 2014-10-08 ENCOUNTER — Telehealth: Payer: Self-pay

## 2014-10-08 MED ORDER — WARFARIN SODIUM 3 MG PO TABS: 3.0000 mg | ORAL_TABLET | ORAL | Status: DC

## 2014-10-08 MED ORDER — AMIODARONE HCL 200 MG PO TABS: 200.0000 mg | ORAL_TABLET | Freq: Every day | ORAL | Status: DC

## 2014-10-08 NOTE — Telephone Encounter (Signed)
Recd fax with patients INR result of 1.09---patients goal range is 2.0-3.0----patient takes 3mg  on every day of the week except Thursday (takes 1 1/2 tab or 4.5 mg)----are you ok with advising patient that he needs to take extra (3mg ) tablet today and then resume normal schedule

## 2014-10-08 NOTE — Telephone Encounter (Signed)
Have patient take 2 tablets (6 mg ) today and then increase to 1.5 tablets on Monday and Thursday.

## 2014-10-09 DIAGNOSIS — E1129 Type 2 diabetes mellitus with other diabetic kidney complication: Secondary | ICD-10-CM | POA: Diagnosis not present

## 2014-10-09 DIAGNOSIS — N2581 Secondary hyperparathyroidism of renal origin: Secondary | ICD-10-CM | POA: Diagnosis not present

## 2014-10-09 DIAGNOSIS — E876 Hypokalemia: Secondary | ICD-10-CM | POA: Diagnosis not present

## 2014-10-09 DIAGNOSIS — N186 End stage renal disease: Secondary | ICD-10-CM | POA: Diagnosis not present

## 2014-10-09 DIAGNOSIS — D509 Iron deficiency anemia, unspecified: Secondary | ICD-10-CM | POA: Diagnosis not present

## 2014-10-09 DIAGNOSIS — D631 Anemia in chronic kidney disease: Secondary | ICD-10-CM | POA: Diagnosis not present

## 2014-10-11 ENCOUNTER — Other Ambulatory Visit: Payer: Self-pay | Admitting: General Practice

## 2014-10-11 ENCOUNTER — Telehealth: Payer: Self-pay

## 2014-10-11 ENCOUNTER — Telehealth: Payer: Self-pay | Admitting: *Deleted

## 2014-10-11 MED ORDER — WARFARIN SODIUM 3 MG PO TABS: ORAL_TABLET | ORAL | Status: DC

## 2014-10-11 NOTE — Telephone Encounter (Signed)
Left msg on triage stating need to clarify directions on pt coumadin that was electronically sent...Johny Chess

## 2014-10-11 NOTE — Telephone Encounter (Signed)
Patient was advised of greg calones note, patient repeated back x2 for understanding

## 2014-10-11 NOTE — Telephone Encounter (Signed)
Patient was advised of greg calone's note, patient repeated back for understanding

## 2014-10-11 NOTE — Telephone Encounter (Signed)
i tried to contact patient to let him know that dr Ronnald Ramp has approved rx refill for coumadin/warfarin, i have sent rx to [facility]---patients phone rings about 3 times and then stops ringing, no way to leave voicemail, if patient calls back, he has rx to pick up at [facility] on [address] for coumadin

## 2014-10-13 ENCOUNTER — Ambulatory Visit (INDEPENDENT_AMBULATORY_CARE_PROVIDER_SITE_OTHER)
Admission: RE | Admit: 2014-10-13 | Discharge: 2014-10-13 | Disposition: A | Discharge status: Home / Self Care | Payer: Medicare Other | Source: Ambulatory Visit | Attending: Family Medicine | Admitting: Family Medicine

## 2014-10-13 ENCOUNTER — Telehealth: Payer: Self-pay | Admitting: Internal Medicine

## 2014-10-13 ENCOUNTER — Encounter: Payer: Self-pay | Admitting: Internal Medicine

## 2014-10-13 ENCOUNTER — Ambulatory Visit (INDEPENDENT_AMBULATORY_CARE_PROVIDER_SITE_OTHER): Payer: Medicare Other | Admitting: Family Medicine

## 2014-10-13 ENCOUNTER — Encounter: Payer: Self-pay | Admitting: Family Medicine

## 2014-10-13 ENCOUNTER — Ambulatory Visit (INDEPENDENT_AMBULATORY_CARE_PROVIDER_SITE_OTHER): Payer: Medicare Other | Admitting: Internal Medicine

## 2014-10-13 VITALS — BP 122/82 | HR 94 | Ht 66.0 in | Wt 247.0 lb

## 2014-10-13 VITALS — BP 122/82 | HR 94 | Temp 99.3°F | Ht 66.0 in | Wt 247.0 lb

## 2014-10-13 DIAGNOSIS — I5020 Unspecified systolic (congestive) heart failure: Secondary | ICD-10-CM | POA: Diagnosis not present

## 2014-10-13 DIAGNOSIS — D509 Iron deficiency anemia, unspecified: Secondary | ICD-10-CM | POA: Diagnosis not present

## 2014-10-13 DIAGNOSIS — L97909 Non-pressure chronic ulcer of unspecified part of unspecified lower leg with unspecified severity: Secondary | ICD-10-CM | POA: Insufficient documentation

## 2014-10-13 DIAGNOSIS — N186 End stage renal disease: Secondary | ICD-10-CM | POA: Diagnosis not present

## 2014-10-13 DIAGNOSIS — M1710 Unilateral primary osteoarthritis, unspecified knee: Secondary | ICD-10-CM | POA: Diagnosis not present

## 2014-10-13 DIAGNOSIS — M5136 Other intervertebral disc degeneration, lumbar region: Secondary | ICD-10-CM | POA: Diagnosis not present

## 2014-10-13 DIAGNOSIS — Z992 Dependence on renal dialysis: Secondary | ICD-10-CM | POA: Diagnosis not present

## 2014-10-13 DIAGNOSIS — I83013 Varicose veins of right lower extremity with ulcer of ankle: Secondary | ICD-10-CM

## 2014-10-13 DIAGNOSIS — M545 Low back pain: Secondary | ICD-10-CM

## 2014-10-13 DIAGNOSIS — I120 Hypertensive chronic kidney disease with stage 5 chronic kidney disease or end stage renal disease: Secondary | ICD-10-CM | POA: Diagnosis not present

## 2014-10-13 DIAGNOSIS — S32030A Wedge compression fracture of third lumbar vertebra, initial encounter for closed fracture: Secondary | ICD-10-CM

## 2014-10-13 DIAGNOSIS — IMO0001 Reserved for inherently not codable concepts without codable children: Secondary | ICD-10-CM

## 2014-10-13 DIAGNOSIS — I83014 Varicose veins of right lower extremity with ulcer of heel and midfoot: Secondary | ICD-10-CM

## 2014-10-13 DIAGNOSIS — I83009 Varicose veins of unspecified lower extremity with ulcer of unspecified site: Secondary | ICD-10-CM | POA: Insufficient documentation

## 2014-10-13 DIAGNOSIS — E876 Hypokalemia: Secondary | ICD-10-CM | POA: Diagnosis not present

## 2014-10-13 DIAGNOSIS — E1129 Type 2 diabetes mellitus with other diabetic kidney complication: Secondary | ICD-10-CM | POA: Diagnosis not present

## 2014-10-13 DIAGNOSIS — I83011 Varicose veins of right lower extremity with ulcer of thigh: Secondary | ICD-10-CM

## 2014-10-13 DIAGNOSIS — N2581 Secondary hyperparathyroidism of renal origin: Secondary | ICD-10-CM | POA: Diagnosis not present

## 2014-10-13 DIAGNOSIS — I639 Cerebral infarction, unspecified: Secondary | ICD-10-CM

## 2014-10-13 DIAGNOSIS — I83012 Varicose veins of right lower extremity with ulcer of calf: Secondary | ICD-10-CM

## 2014-10-13 DIAGNOSIS — R296 Repeated falls: Secondary | ICD-10-CM | POA: Diagnosis not present

## 2014-10-13 DIAGNOSIS — I83015 Varicose veins of right lower extremity with ulcer other part of foot: Secondary | ICD-10-CM

## 2014-10-13 DIAGNOSIS — I10 Essential (primary) hypertension: Secondary | ICD-10-CM

## 2014-10-13 DIAGNOSIS — I83019 Varicose veins of right lower extremity with ulcer of unspecified site: Secondary | ICD-10-CM

## 2014-10-13 DIAGNOSIS — D631 Anemia in chronic kidney disease: Secondary | ICD-10-CM | POA: Diagnosis not present

## 2014-10-13 DIAGNOSIS — I83018 Varicose veins of right lower extremity with ulcer other part of lower leg: Secondary | ICD-10-CM

## 2014-10-13 MED ORDER — HYDROXYZINE HCL 25 MG PO TABS: 25.0000 mg | ORAL_TABLET | Freq: Three times a day (TID) | ORAL | Status: DC | PRN

## 2014-10-13 NOTE — Progress Notes (Signed)
Pre visit review using our clinic review tool, if applicable. No additional management support is needed unless otherwise documented below in the visit note. 

## 2014-10-13 NOTE — Addendum Note (Signed)
Addended by: Biagio Borg on: 10/13/2014 11:53 AM   Modules accepted: Orders

## 2014-10-13 NOTE — Assessment & Plan Note (Signed)
stable overall by history and exam, recent data reviewed with pt, and pt to continue medical treatment as before,  to f/u any worsening symptoms or concerns BP Readings from Last 3 Encounters:  10/13/14 122/82  10/13/14 122/82  08/31/14 108/67

## 2014-10-13 NOTE — Assessment & Plan Note (Signed)
Repeat x-rays to make sure that this is stabilized. If not we can discuss with patient about possible kyphoplasty. Call patient with results that would change medical management.

## 2014-10-13 NOTE — Progress Notes (Signed)
Subjective:    Patient ID: Daniel Whitney, male    DOB: 1955-09-09, 59 y.o.   MRN: [id number]  HPI  Here to f/u post hospn July 2016, has gained 20 lbs by EMR he thinks mostly fat wt eating "better".  Pt denies chest pain, increased sob or doe, wheezing, orthopnea, PND, increased LE swelling, palpitations, dizziness or syncope.  Pt denies new neurological symptoms such as new headache, or facial or extremity weakness or numbness  Does have right post leg superficial wound, using neosporin.  Has fallen twice since hosp d/c., Needs pulm f/u referral with Dr Lamonte Sakai for OSA. No fever, ST, cough and Denies urinary symptoms such as dysuria, frequency, urgency, flank pain, hematuria or n/v, fever, chills. Wt Readings from Last 3 Encounters:  10/13/14 247 lb (112.038 kg)  08/31/14 227 lb 4.7 oz (103.1 kg)  08/04/14 265 lb (120.203 kg)   Past Medical History  Diagnosis Date  . Glaucoma   . Hypertension   . Hyperlipidemia   . Atrial fibrillation 12/30/2012  . CHF (congestive heart failure) 12/30/2012  . Diabetic retinopathy 12/30/2012  . Morbid obesity 12/30/2012  . Pulmonary hypertension 12/30/2012  . Myocardial infarction     "I've had ~ 3; last one was in ~ 10/2013" (01/30/2014)  . Diabetes mellitus with neuropathy   . OSA (obstructive sleep apnea) 12/30/2012    "lost weight; no longer needed CPAP; retested said I needed it; didn't followup cause I was feeling fine" (01/30/2014)  . GERD (gastroesophageal reflux disease)   . Stroke ~ 2005; ~ 2005    "they were mild; I didn't even notice I'd had them"; denies residual on 01/30/2014  . History of gout     "right big toe"  . Arthritis     "all over"  . Chronic lower back pain   . ESRD (end stage renal disease) on dialysis started in 2013    "MWF; [facility]; [address]" (01/30/2014)  . Refusal of blood transfusions as patient is Jehovah's Witness   . Anemia due to other cause 07/07/2014  . Allergic rhinitis 07/07/2014   Past Surgical  History  Procedure Laterality Date  . Total hip arthroplasty Right 1980  . Av fistula placement Left ~ 10/2013    "forearm"  . Closed reduction hip dislocation Right 1970's    reports that he has never smoked. He has never used smokeless tobacco. He reports that he drinks about 1.5 - 2.0 oz of alcohol per week. He reports that he does not use illicit drugs. family history includes Arthritis in his other; Diabetes in his other; Heart disease in his other; Hyperlipidemia in his other; Hypertension in his other; Kidney disease in his other; Stroke in his other. No Known Allergies Current Outpatient Prescriptions on File Prior to Visit  Medication Sig Dispense Refill  . allopurinol (ZYLOPRIM) 100 MG tablet TAKE ONE TABLET BY MOUTH TWICE DAILY 180 tablet 0  . amiodarone (PACERONE) 200 MG tablet Take 1 tablet (200 mg total) by mouth daily. 90 tablet 1  . amitriptyline (ELAVIL) 50 MG tablet TAKE ONE TABLET BY MOUTH ONCE DAILY AT BEDTIME 90 tablet 0  . carvedilol (COREG) 25 MG tablet Take 25 mg by mouth 2 (two) times daily with a meal.    . JANUVIA 25 MG tablet TAKE ONE TABLET BY MOUTH ONCE DAILY 90 tablet 0  . loperamide (IMODIUM A-D) 2 MG tablet Take 2 mg by mouth 4 (four) times daily as needed for diarrhea or loose stools.    Marland Kitchen  ondansetron (ZOFRAN) 4 MG tablet Take 1 tablet (4 mg total) by mouth every 6 (six) hours. 30 tablet 0  . ranitidine (ZANTAC) 150 MG tablet Take 150 mg by mouth daily as needed for heartburn.    . sevelamer carbonate (RENVELA) 800 MG tablet Take 1,600 mg by mouth 3 (three) times daily with meals.     . simvastatin (ZOCOR) 20 MG tablet TAKE ONE TABLET BY MOUTH ONCE DAILY 90 tablet 0  . sodium chloride (OCEAN) 0.65 % SOLN nasal spray Place 1 spray into both nostrils as needed for congestion. 104 mL 0  . tadalafil (CIALIS) 20 MG tablet Take 1 tablet (20 mg total) by mouth daily as needed for erectile dysfunction. 5 tablet 11  . warfarin (COUMADIN) 3 MG tablet Please take as  directed by anticoagulation clinic. 90 tablet 0  . acetaminophen (TYLENOL) 650 MG CR tablet Take 650 mg by mouth every 8 (eight) hours as needed for pain.    . cetirizine (ZYRTEC) 10 MG tablet Take 1 tablet (10 mg total) by mouth daily. (Patient not taking: Reported on 10/13/2014) 30 tablet 11  . cholestyramine (QUESTRAN) 4 G packet Take 1 packet (4 g total) by mouth 2 (two) times daily. (Patient not taking: Reported on 10/13/2014) 60 each 1  . docusate sodium (COLACE) 100 MG capsule Take 100 mg by mouth daily.    Marland Kitchen OVER THE COUNTER MEDICATION 1 application 2 (two) times daily. Melaquin Complex    . Vitamin D, Ergocalciferol, (DRISDOL) 50000 UNITS CAPS capsule Take 1 capsule (50,000 Units total) by mouth every 7 (seven) days. (Patient not taking: Reported on 10/13/2014) 8 capsule 0   No current facility-administered medications on file prior to visit.   Review of Systems  Constitutional: Negative for unusual diaphoresis or night sweats HENT: Negative for ringing in ear or discharge Eyes: Negative for double vision or worsening visual disturbance.  Respiratory: Negative for choking and stridor.   Gastrointestinal: Negative for vomiting or other signifcant bowel change Genitourinary: Negative for hematuria or change in urine volume.  Musculoskeletal: Negative for other MSK pain or swelling Skin: Negative for color change and worsening wound.  Neurological: Negative for tremors and numbness other than noted  Psychiatric/Behavioral: Negative for decreased concentration or agitation other than above       Objective:   Physical Exam BP 122/82 mmHg  Pulse 94  Temp(Src) 99.3 F (37.4 C) (Oral)  Ht 5\' 6"  (1.676 m)  Wt 247 lb (112.038 kg)  BMI 39.89 kg/m2  SpO2 96% VS noted, not ill appearing Constitutional: Pt appears in no significant distress HENT: Head: NCAT.  Right Ear: External ear normal.  Left Ear: External ear normal.  Eyes: . Pupils are equal, round, and reactive to light.  Conjunctivae and EOM are normal Neck: Normal range of motion. Neck supple.  Cardiovascular: Normal rate and regular rhythm.   Pulmonary/Chest: Effort normal and breath sounds without rales or wheezing.  Abd:  Soft, NT, ND, + BS Neurological: Pt is alert. Not confused , motor grossly intact Skin: Skin is warm. No rash, trace left LE edema ankle, < 1/2 cm 5 mm deep wound post right leg approx 4 cm above ankle, no s/s infeciton - no drainage, swelling, redness or smell Psychiatric: Pt behavior is normal. No agitation.     Assessment & Plan:

## 2014-10-13 NOTE — Patient Instructions (Signed)
Good to see you They will call you on the custom brace.  Ice is your friend Watch blood sugar next 3 days to make sure it does not go high Xray of back today to make compression fracture is doing well.  See me again in 4 weeks and we can do the Orthovisc again if needed.

## 2014-10-13 NOTE — Assessment & Plan Note (Signed)
Patient given bilateral injections again today. We discussed icing regimen. Patient will be a candidate for custom bracing at this time and we'll see if this will be beneficial. We discussed icing regimen. Patient is artery doing rehabilitation for other comorbidities. Patient will come back and see me again in one month for further evaluation. At that time he could be a candidate for Synvisc or Orthovisc injections.

## 2014-10-13 NOTE — Progress Notes (Signed)
[facility] Daniel Whitney, [place] [address] Phone: [phone] Subjective:     CC: Bilateral knee pain follow up Since falling back pain  [id number] Daniel Whitney is a 59 y.o. male patient is coming back for his bilateral knee pain.patient did finish this series of Synvisc injections greater than 9 months ago. Patient was given sterilely injection 3 months ago. Patient and forcefully did have a cardiac arrest and was hospitalized for quite some time. Patient is now back. Patient does try to remain active in start doing some cardiac rehabilitation but is having some difficulty significant for knee pain. Patient states that it started coming back over the course last month. States that the steroid injections did help last time. Patient states that he was wearing the brace but the Velcro has stopped working. Patient is doing the icing and continues with his regular medicines..  Regarding patient's back pain.  Patient was seen previously and did have a compression fracture in the lumbar vertebrae. Patient was given calcitonin for short course. Patient discontinued that after one month. Patient states that it seemed to be better but it has now been baseline. Patient still has a dull throbbing aching sensation. Denies any radiation down the legs or any numbness.      Past medical history, social, surgical and family history all reviewed in electronic medical record.   Review of Systems: No headache, visual changes, nausea, vomiting, diarrhea, constipation, dizziness, abdominal pain, skin rash, fevers, chills, night sweats, weight loss, swollen lymph nodes, body aches, joint swelling, muscle aches, chest pain, shortness of breath, mood changes.   Objective Blood pressure 122/82, pulse 94, height 5\' 6"  (1.676 m), weight 247 lb (112.038 kg), SpO2 96 %.  General: No apparent distress alert and oriented x3 mood and affect normal, dressed appropriately. Morbid  obesity.  HEENT: Pupils equal, extraocular movements intact patient though does not respond well to light in the right eye Respiratory: Patient's speak in full sentences and does not appear short of breath  Cardiovascular: No lower extremity edema, non tender, no erythema  Skin: Warm dry intact with no signs of infection or rash on extremities or on axial skeleton.  Abdomen: Soft nontender  Neuro: Cranial nerves II through XII are intact, neurovascularly intact in all extremities with 2+ DTRs and 2+ pulses.  Lymph: No lymphadenopathy of posterior or anterior cervical chain or axillae bilaterally.  Gait in a wheelchair today MSK:  Non tender with full range of motion and good stability and symmetric strength and tone of , elbows, wrist,  and ankles bilaterally.   Back Exam:  Inspection: Unremarkable  SLR laying: Negative  XSLR laying: Negative  Palpable tenderness: Moderate tenderness still in the paraspinal musculature and mild spinal process tenderness near L3-L4. Improvement from previous exam. FABER: negative. Sensory change: Gross sensation intact to all lumbar and sacral dermatomes.  Reflexes: 2+ at both patellar tendons, 2+ at achilles tendons, Babinski's downgoing.  Strength at foot  Plantar-flexion: 5/5 Dorsi-flexion: 5/5 Eversion: 5/5 Inversion: 5/5 and symmetric Leg strength  Quad: 5/5 Hamstring: 5/5 Hip flexor: 5/5 and symmetric    Bilateral knee Exam shows the patient does have crepitus bilaterally and severe tenderness to the medial joint line. Patient does have full range of motion. Neurovascular intact distally. Patient does have some instability of the knees bilaterally. Trace effusion of the knees bilaterally as well.  After informed written and verbal consent, patient was seated on exam table. Right knee was prepped with  alcohol swab and utilizing anterolateral approach, patient's right knee space was injected with 4:1  marcaine 0.5%: Kenalog 40mg /dL. Patient tolerated  the procedure well without immediate complications.  After informed written and verbal consent, patient was seated on exam table. Left knee was prepped with alcohol swab and utilizing anterolateral approach, patient's left knee space was injected with 4:1  marcaine 0.5%: Kenalog 40mg /dL. Patient tolerated the procedure well without immediate complications.    Impression and Recommendations:     This case required medical decision making of moderate complexity.

## 2014-10-13 NOTE — Assessment & Plan Note (Signed)
[place] for [facility] RN and PT eval,

## 2014-10-13 NOTE — Telephone Encounter (Signed)
Patient dismissed from [facility] by Cathlean Cower MD , effective October 01, 2014. Dismissal letter sent out by certified / registered mail.  DAJ  Received signed domestic return receipt verifying delivery of certified letter on October 19, 2014. Article number [id number] [phone] [id number]

## 2014-10-13 NOTE — Assessment & Plan Note (Signed)
euvolemic by exam, managed well with HD

## 2014-10-13 NOTE — Patient Instructions (Signed)
OK to use the Neosporin for the wound  Please continue all other medications as before, and refills have been done if requested.  Please have the pharmacy call with any other refills you may need.  Please continue your efforts at being more active, low cholesterol diet, and weight control.  Please keep your appointments with your specialists as you may have planned  You will be contacted regarding the referral for: Home Health and PT

## 2014-10-13 NOTE — Assessment & Plan Note (Signed)
Small superficial, no infection, ok for cont'd home care with neosporin, daily bandage change, f/u any worsening, consider wound clinic referral

## 2014-10-14 DIAGNOSIS — E876 Hypokalemia: Secondary | ICD-10-CM | POA: Diagnosis not present

## 2014-10-14 DIAGNOSIS — N2581 Secondary hyperparathyroidism of renal origin: Secondary | ICD-10-CM | POA: Diagnosis not present

## 2014-10-14 DIAGNOSIS — Z23 Encounter for immunization: Secondary | ICD-10-CM | POA: Diagnosis not present

## 2014-10-14 DIAGNOSIS — D509 Iron deficiency anemia, unspecified: Secondary | ICD-10-CM | POA: Diagnosis not present

## 2014-10-14 DIAGNOSIS — D631 Anemia in chronic kidney disease: Secondary | ICD-10-CM | POA: Diagnosis not present

## 2014-10-14 DIAGNOSIS — N186 End stage renal disease: Secondary | ICD-10-CM | POA: Diagnosis not present

## 2014-10-16 DIAGNOSIS — D509 Iron deficiency anemia, unspecified: Secondary | ICD-10-CM | POA: Diagnosis not present

## 2014-10-16 DIAGNOSIS — N2581 Secondary hyperparathyroidism of renal origin: Secondary | ICD-10-CM | POA: Diagnosis not present

## 2014-10-16 DIAGNOSIS — N186 End stage renal disease: Secondary | ICD-10-CM | POA: Diagnosis not present

## 2014-10-16 DIAGNOSIS — D631 Anemia in chronic kidney disease: Secondary | ICD-10-CM | POA: Diagnosis not present

## 2014-10-16 DIAGNOSIS — E876 Hypokalemia: Secondary | ICD-10-CM | POA: Diagnosis not present

## 2014-10-16 DIAGNOSIS — Z23 Encounter for immunization: Secondary | ICD-10-CM | POA: Diagnosis not present

## 2014-10-18 ENCOUNTER — Other Ambulatory Visit: Payer: Self-pay | Admitting: Internal Medicine

## 2014-10-18 DIAGNOSIS — D631 Anemia in chronic kidney disease: Secondary | ICD-10-CM | POA: Diagnosis not present

## 2014-10-18 DIAGNOSIS — Z23 Encounter for immunization: Secondary | ICD-10-CM | POA: Diagnosis not present

## 2014-10-18 DIAGNOSIS — N186 End stage renal disease: Secondary | ICD-10-CM | POA: Diagnosis not present

## 2014-10-18 DIAGNOSIS — D509 Iron deficiency anemia, unspecified: Secondary | ICD-10-CM | POA: Diagnosis not present

## 2014-10-18 DIAGNOSIS — N2581 Secondary hyperparathyroidism of renal origin: Secondary | ICD-10-CM | POA: Diagnosis not present

## 2014-10-18 DIAGNOSIS — E876 Hypokalemia: Secondary | ICD-10-CM | POA: Diagnosis not present

## 2014-10-19 DIAGNOSIS — I5020 Unspecified systolic (congestive) heart failure: Secondary | ICD-10-CM | POA: Diagnosis not present

## 2014-10-19 DIAGNOSIS — E1122 Type 2 diabetes mellitus with diabetic chronic kidney disease: Secondary | ICD-10-CM | POA: Diagnosis not present

## 2014-10-20 DIAGNOSIS — E1122 Type 2 diabetes mellitus with diabetic chronic kidney disease: Secondary | ICD-10-CM | POA: Diagnosis not present

## 2014-10-20 DIAGNOSIS — D509 Iron deficiency anemia, unspecified: Secondary | ICD-10-CM | POA: Diagnosis not present

## 2014-10-20 DIAGNOSIS — E876 Hypokalemia: Secondary | ICD-10-CM | POA: Diagnosis not present

## 2014-10-20 DIAGNOSIS — N2581 Secondary hyperparathyroidism of renal origin: Secondary | ICD-10-CM | POA: Diagnosis not present

## 2014-10-20 DIAGNOSIS — N186 End stage renal disease: Secondary | ICD-10-CM | POA: Diagnosis not present

## 2014-10-20 DIAGNOSIS — I5020 Unspecified systolic (congestive) heart failure: Secondary | ICD-10-CM | POA: Diagnosis not present

## 2014-10-20 DIAGNOSIS — Z23 Encounter for immunization: Secondary | ICD-10-CM | POA: Diagnosis not present

## 2014-10-20 DIAGNOSIS — D631 Anemia in chronic kidney disease: Secondary | ICD-10-CM | POA: Diagnosis not present

## 2014-10-22 DIAGNOSIS — E876 Hypokalemia: Secondary | ICD-10-CM | POA: Diagnosis not present

## 2014-10-22 DIAGNOSIS — D631 Anemia in chronic kidney disease: Secondary | ICD-10-CM | POA: Diagnosis not present

## 2014-10-22 DIAGNOSIS — N2581 Secondary hyperparathyroidism of renal origin: Secondary | ICD-10-CM | POA: Diagnosis not present

## 2014-10-22 DIAGNOSIS — D509 Iron deficiency anemia, unspecified: Secondary | ICD-10-CM | POA: Diagnosis not present

## 2014-10-22 DIAGNOSIS — I5020 Unspecified systolic (congestive) heart failure: Secondary | ICD-10-CM | POA: Diagnosis not present

## 2014-10-22 DIAGNOSIS — N186 End stage renal disease: Secondary | ICD-10-CM | POA: Diagnosis not present

## 2014-10-22 DIAGNOSIS — E1122 Type 2 diabetes mellitus with diabetic chronic kidney disease: Secondary | ICD-10-CM | POA: Diagnosis not present

## 2014-10-22 DIAGNOSIS — Z23 Encounter for immunization: Secondary | ICD-10-CM | POA: Diagnosis not present

## 2014-10-25 ENCOUNTER — Telehealth: Payer: Self-pay | Admitting: Internal Medicine

## 2014-10-25 DIAGNOSIS — Z23 Encounter for immunization: Secondary | ICD-10-CM | POA: Diagnosis not present

## 2014-10-25 DIAGNOSIS — D509 Iron deficiency anemia, unspecified: Secondary | ICD-10-CM | POA: Diagnosis not present

## 2014-10-25 DIAGNOSIS — N2581 Secondary hyperparathyroidism of renal origin: Secondary | ICD-10-CM | POA: Diagnosis not present

## 2014-10-25 DIAGNOSIS — N186 End stage renal disease: Secondary | ICD-10-CM | POA: Diagnosis not present

## 2014-10-25 DIAGNOSIS — E876 Hypokalemia: Secondary | ICD-10-CM | POA: Diagnosis not present

## 2014-10-25 DIAGNOSIS — I5020 Unspecified systolic (congestive) heart failure: Secondary | ICD-10-CM | POA: Diagnosis not present

## 2014-10-25 DIAGNOSIS — E1122 Type 2 diabetes mellitus with diabetic chronic kidney disease: Secondary | ICD-10-CM | POA: Diagnosis not present

## 2014-10-25 DIAGNOSIS — D631 Anemia in chronic kidney disease: Secondary | ICD-10-CM | POA: Diagnosis not present

## 2014-10-25 NOTE — Telephone Encounter (Signed)
Call from [place] at [place].  Started PT last week for 5 weeks.  Needs order to add social worker for patient.

## 2014-10-26 NOTE — Telephone Encounter (Signed)
Ok for verbal if this is ok 

## 2014-10-27 DIAGNOSIS — D631 Anemia in chronic kidney disease: Secondary | ICD-10-CM | POA: Diagnosis not present

## 2014-10-27 DIAGNOSIS — Z23 Encounter for immunization: Secondary | ICD-10-CM | POA: Diagnosis not present

## 2014-10-27 DIAGNOSIS — N2581 Secondary hyperparathyroidism of renal origin: Secondary | ICD-10-CM | POA: Diagnosis not present

## 2014-10-27 DIAGNOSIS — E876 Hypokalemia: Secondary | ICD-10-CM | POA: Diagnosis not present

## 2014-10-27 DIAGNOSIS — N186 End stage renal disease: Secondary | ICD-10-CM | POA: Diagnosis not present

## 2014-10-27 DIAGNOSIS — D509 Iron deficiency anemia, unspecified: Secondary | ICD-10-CM | POA: Diagnosis not present

## 2014-10-27 NOTE — Telephone Encounter (Signed)
Notified Seree with md response...Daniel Whitney

## 2014-10-28 DIAGNOSIS — I5020 Unspecified systolic (congestive) heart failure: Secondary | ICD-10-CM | POA: Diagnosis not present

## 2014-10-28 DIAGNOSIS — E1122 Type 2 diabetes mellitus with diabetic chronic kidney disease: Secondary | ICD-10-CM | POA: Diagnosis not present

## 2014-10-29 ENCOUNTER — Telehealth: Payer: Self-pay | Admitting: Internal Medicine

## 2014-10-29 DIAGNOSIS — I5020 Unspecified systolic (congestive) heart failure: Secondary | ICD-10-CM | POA: Diagnosis not present

## 2014-10-29 DIAGNOSIS — N186 End stage renal disease: Secondary | ICD-10-CM | POA: Diagnosis not present

## 2014-10-29 DIAGNOSIS — D509 Iron deficiency anemia, unspecified: Secondary | ICD-10-CM | POA: Diagnosis not present

## 2014-10-29 DIAGNOSIS — N2581 Secondary hyperparathyroidism of renal origin: Secondary | ICD-10-CM | POA: Diagnosis not present

## 2014-10-29 DIAGNOSIS — Z23 Encounter for immunization: Secondary | ICD-10-CM | POA: Diagnosis not present

## 2014-10-29 DIAGNOSIS — E876 Hypokalemia: Secondary | ICD-10-CM | POA: Diagnosis not present

## 2014-10-29 DIAGNOSIS — E1122 Type 2 diabetes mellitus with diabetic chronic kidney disease: Secondary | ICD-10-CM | POA: Diagnosis not present

## 2014-10-29 DIAGNOSIS — D631 Anemia in chronic kidney disease: Secondary | ICD-10-CM | POA: Diagnosis not present

## 2014-10-29 MED ORDER — GLUCOSE BLOOD VI STRP: 1.0000 | ORAL_STRIP | Freq: Two times a day (BID) | Status: DC

## 2014-10-29 MED ORDER — ONETOUCH ULTRA 2 W/DEVICE KIT: PACK | Status: DC

## 2014-10-29 MED ORDER — ONETOUCH ULTRASOFT LANCETS MISC: 1.0000 | Freq: Two times a day (BID) | Status: DC

## 2014-10-29 NOTE — Telephone Encounter (Signed)
Heather from [facility] called in said that pt needs glucometer and supplies called in to [employer]

## 2014-10-29 NOTE — Telephone Encounter (Signed)
Sent one touch monitor with supplies...Johny Chess

## 2014-11-01 DIAGNOSIS — D509 Iron deficiency anemia, unspecified: Secondary | ICD-10-CM | POA: Diagnosis not present

## 2014-11-01 DIAGNOSIS — Z23 Encounter for immunization: Secondary | ICD-10-CM | POA: Diagnosis not present

## 2014-11-01 DIAGNOSIS — N186 End stage renal disease: Secondary | ICD-10-CM | POA: Diagnosis not present

## 2014-11-01 DIAGNOSIS — E876 Hypokalemia: Secondary | ICD-10-CM | POA: Diagnosis not present

## 2014-11-01 DIAGNOSIS — D631 Anemia in chronic kidney disease: Secondary | ICD-10-CM | POA: Diagnosis not present

## 2014-11-01 DIAGNOSIS — N2581 Secondary hyperparathyroidism of renal origin: Secondary | ICD-10-CM | POA: Diagnosis not present

## 2014-11-02 ENCOUNTER — Ambulatory Visit (INDEPENDENT_AMBULATORY_CARE_PROVIDER_SITE_OTHER): Payer: Medicare Other | Admitting: General Practice

## 2014-11-02 DIAGNOSIS — I272 Pulmonary hypertension, unspecified: Secondary | ICD-10-CM

## 2014-11-02 DIAGNOSIS — I270 Primary pulmonary hypertension: Secondary | ICD-10-CM

## 2014-11-02 DIAGNOSIS — E1122 Type 2 diabetes mellitus with diabetic chronic kidney disease: Secondary | ICD-10-CM | POA: Diagnosis not present

## 2014-11-02 DIAGNOSIS — I5020 Unspecified systolic (congestive) heart failure: Secondary | ICD-10-CM | POA: Diagnosis not present

## 2014-11-02 DIAGNOSIS — T82858D Stenosis of vascular prosthetic devices, implants and grafts, subsequent encounter: Secondary | ICD-10-CM | POA: Diagnosis not present

## 2014-11-02 DIAGNOSIS — N186 End stage renal disease: Secondary | ICD-10-CM | POA: Diagnosis not present

## 2014-11-02 DIAGNOSIS — I639 Cerebral infarction, unspecified: Secondary | ICD-10-CM

## 2014-11-02 DIAGNOSIS — I871 Compression of vein: Secondary | ICD-10-CM | POA: Diagnosis not present

## 2014-11-02 DIAGNOSIS — Z5181 Encounter for therapeutic drug level monitoring: Secondary | ICD-10-CM

## 2014-11-02 DIAGNOSIS — Z992 Dependence on renal dialysis: Secondary | ICD-10-CM | POA: Diagnosis not present

## 2014-11-02 NOTE — Progress Notes (Signed)
Discontinuation noted.

## 2014-11-03 DIAGNOSIS — N2581 Secondary hyperparathyroidism of renal origin: Secondary | ICD-10-CM | POA: Diagnosis not present

## 2014-11-03 DIAGNOSIS — Z23 Encounter for immunization: Secondary | ICD-10-CM | POA: Diagnosis not present

## 2014-11-03 DIAGNOSIS — D631 Anemia in chronic kidney disease: Secondary | ICD-10-CM | POA: Diagnosis not present

## 2014-11-03 DIAGNOSIS — E876 Hypokalemia: Secondary | ICD-10-CM | POA: Diagnosis not present

## 2014-11-03 DIAGNOSIS — N186 End stage renal disease: Secondary | ICD-10-CM | POA: Diagnosis not present

## 2014-11-03 DIAGNOSIS — D509 Iron deficiency anemia, unspecified: Secondary | ICD-10-CM | POA: Diagnosis not present

## 2014-11-04 ENCOUNTER — Telehealth: Payer: Self-pay | Admitting: Internal Medicine

## 2014-11-04 DIAGNOSIS — I5020 Unspecified systolic (congestive) heart failure: Secondary | ICD-10-CM | POA: Diagnosis not present

## 2014-11-04 DIAGNOSIS — E1122 Type 2 diabetes mellitus with diabetic chronic kidney disease: Secondary | ICD-10-CM | POA: Diagnosis not present

## 2014-11-04 NOTE — Telephone Encounter (Signed)
Patient states he would like Dr. Jenny Reichmann to reconsider discharge.  He states he has been in and out of the hospital and that was a lot of the cause of him missing appointments.  He also states he had asked Dr. Lorelee New ago for a glucose meter and he would like to know if he could get one ordered?

## 2014-11-04 NOTE — Telephone Encounter (Signed)
Heather with [facility] called back.  She would like to add.  On wound assessment today, his wound is not healing(states it is red, warm and swelling) that Dr. Jenny Reichmann last seen him for.  She is requesting for Dr. Jenny Reichmann to see him.  Nira Conn can be reached at [phone].

## 2014-11-04 NOTE — Telephone Encounter (Signed)
Very sorry, pt has been dismissed, and I will no longer be able to help at this time, since it has been now more than 30 days since the dismissal letter.  I would urge to go to UC or ER for leg asap for evaluation

## 2014-11-05 DIAGNOSIS — N2581 Secondary hyperparathyroidism of renal origin: Secondary | ICD-10-CM | POA: Diagnosis not present

## 2014-11-05 DIAGNOSIS — D631 Anemia in chronic kidney disease: Secondary | ICD-10-CM | POA: Diagnosis not present

## 2014-11-05 DIAGNOSIS — Z23 Encounter for immunization: Secondary | ICD-10-CM | POA: Diagnosis not present

## 2014-11-05 DIAGNOSIS — D509 Iron deficiency anemia, unspecified: Secondary | ICD-10-CM | POA: Diagnosis not present

## 2014-11-05 DIAGNOSIS — E876 Hypokalemia: Secondary | ICD-10-CM | POA: Diagnosis not present

## 2014-11-05 DIAGNOSIS — N186 End stage renal disease: Secondary | ICD-10-CM | POA: Diagnosis not present

## 2014-11-05 NOTE — Telephone Encounter (Signed)
Called Daniel Whitney left vm advising of below. Called patient, whos vm is full and would not allow me to leave a message

## 2014-11-08 ENCOUNTER — Telehealth: Payer: Self-pay | Admitting: Internal Medicine

## 2014-11-08 ENCOUNTER — Observation Stay (HOSPITAL_COMMUNITY)
Admission: EM | Admit: 2014-11-08 | Discharge: 2014-11-08 | Disposition: A | Discharge status: Home / Self Care | Payer: Medicare Other | Attending: Internal Medicine | Admitting: Internal Medicine

## 2014-11-08 ENCOUNTER — Emergency Department (HOSPITAL_COMMUNITY): Payer: Medicare Other

## 2014-11-08 ENCOUNTER — Encounter (HOSPITAL_COMMUNITY): Payer: Self-pay | Admitting: Emergency Medicine

## 2014-11-08 DIAGNOSIS — Z79899 Other long term (current) drug therapy: Secondary | ICD-10-CM | POA: Diagnosis not present

## 2014-11-08 DIAGNOSIS — E11319 Type 2 diabetes mellitus with unspecified diabetic retinopathy without macular edema: Secondary | ICD-10-CM | POA: Diagnosis not present

## 2014-11-08 DIAGNOSIS — R404 Transient alteration of awareness: Secondary | ICD-10-CM | POA: Diagnosis not present

## 2014-11-08 DIAGNOSIS — I509 Heart failure, unspecified: Secondary | ICD-10-CM | POA: Insufficient documentation

## 2014-11-08 DIAGNOSIS — I272 Other secondary pulmonary hypertension: Secondary | ICD-10-CM | POA: Diagnosis not present

## 2014-11-08 DIAGNOSIS — K219 Gastro-esophageal reflux disease without esophagitis: Secondary | ICD-10-CM | POA: Diagnosis not present

## 2014-11-08 DIAGNOSIS — I480 Paroxysmal atrial fibrillation: Secondary | ICD-10-CM | POA: Diagnosis present

## 2014-11-08 DIAGNOSIS — I252 Old myocardial infarction: Secondary | ICD-10-CM | POA: Diagnosis not present

## 2014-11-08 DIAGNOSIS — G4733 Obstructive sleep apnea (adult) (pediatric): Secondary | ICD-10-CM | POA: Insufficient documentation

## 2014-11-08 DIAGNOSIS — Z7901 Long term (current) use of anticoagulants: Secondary | ICD-10-CM | POA: Insufficient documentation

## 2014-11-08 DIAGNOSIS — R531 Weakness: Secondary | ICD-10-CM | POA: Diagnosis not present

## 2014-11-08 DIAGNOSIS — M1990 Unspecified osteoarthritis, unspecified site: Secondary | ICD-10-CM | POA: Insufficient documentation

## 2014-11-08 DIAGNOSIS — I4891 Unspecified atrial fibrillation: Secondary | ICD-10-CM | POA: Diagnosis not present

## 2014-11-08 DIAGNOSIS — E1122 Type 2 diabetes mellitus with diabetic chronic kidney disease: Secondary | ICD-10-CM | POA: Insufficient documentation

## 2014-11-08 DIAGNOSIS — I120 Hypertensive chronic kidney disease with stage 5 chronic kidney disease or end stage renal disease: Secondary | ICD-10-CM | POA: Insufficient documentation

## 2014-11-08 DIAGNOSIS — Z8673 Personal history of transient ischemic attack (TIA), and cerebral infarction without residual deficits: Secondary | ICD-10-CM | POA: Insufficient documentation

## 2014-11-08 DIAGNOSIS — E1140 Type 2 diabetes mellitus with diabetic neuropathy, unspecified: Secondary | ICD-10-CM | POA: Diagnosis present

## 2014-11-08 DIAGNOSIS — D649 Anemia, unspecified: Secondary | ICD-10-CM | POA: Diagnosis not present

## 2014-11-08 DIAGNOSIS — M109 Gout, unspecified: Secondary | ICD-10-CM | POA: Diagnosis not present

## 2014-11-08 DIAGNOSIS — Z992 Dependence on renal dialysis: Secondary | ICD-10-CM

## 2014-11-08 DIAGNOSIS — I2510 Atherosclerotic heart disease of native coronary artery without angina pectoris: Secondary | ICD-10-CM | POA: Insufficient documentation

## 2014-11-08 DIAGNOSIS — M6281 Muscle weakness (generalized): Secondary | ICD-10-CM | POA: Diagnosis not present

## 2014-11-08 DIAGNOSIS — N186 End stage renal disease: Secondary | ICD-10-CM

## 2014-11-08 DIAGNOSIS — E785 Hyperlipidemia, unspecified: Secondary | ICD-10-CM | POA: Insufficient documentation

## 2014-11-08 DIAGNOSIS — E875 Hyperkalemia: Secondary | ICD-10-CM | POA: Diagnosis not present

## 2014-11-08 DIAGNOSIS — I10 Essential (primary) hypertension: Secondary | ICD-10-CM | POA: Diagnosis present

## 2014-11-08 LAB — COMPREHENSIVE METABOLIC PANEL
ALBUMIN: 3.5 g/dL (ref 3.5–5.0)
ALT: 17 U/L (ref 17–63)
ANION GAP: 17 — AB (ref 5–15)
AST: 20 U/L (ref 15–41)
Alkaline Phosphatase: 182 U/L — ABNORMAL HIGH (ref 38–126)
BUN: 69 mg/dL — ABNORMAL HIGH (ref 6–20)
CHLORIDE: 88 mmol/L — AB (ref 101–111)
CO2: 24 mmol/L (ref 22–32)
CREATININE: 9.95 mg/dL — AB (ref 0.61–1.24)
Calcium: 9.8 mg/dL (ref 8.9–10.3)
GFR calc non Af Amer: 5 mL/min — ABNORMAL LOW (ref >60)
GFR, EST AFRICAN AMERICAN: 6 mL/min — AB (ref >60)
GLUCOSE: 129 mg/dL — AB (ref 65–99)
Potassium: 7.4 mmol/L (ref 3.5–5.1)
SODIUM: 129 mmol/L — AB (ref 135–145)
Total Bilirubin: 0.4 mg/dL (ref 0.3–1.2)
Total Protein: 7.8 g/dL (ref 6.5–8.1)

## 2014-11-08 LAB — I-STAT CHEM 8, ED
BUN: 76 mg/dL — ABNORMAL HIGH (ref 6–20)
CHLORIDE: 94 mmol/L — AB (ref 101–111)
Calcium, Ion: 0.89 mmol/L — ABNORMAL LOW (ref 1.13–1.30)
Creatinine, Ser: 10.1 mg/dL — ABNORMAL HIGH (ref 0.61–1.24)
Glucose, Bld: 88 mg/dL (ref 65–99)
HEMATOCRIT: 47 % (ref 39.0–52.0)
Hemoglobin: 16 g/dL (ref 13.0–17.0)
POTASSIUM: 7.6 mmol/L — AB (ref 3.5–5.1)
SODIUM: 125 mmol/L — AB (ref 135–145)
TCO2: 20 mmol/L (ref 0–100)

## 2014-11-08 LAB — CBC
HCT: 36.3 % — ABNORMAL LOW (ref 39.0–52.0)
HEMATOCRIT: 40.9 % (ref 39.0–52.0)
Hemoglobin: 13.1 g/dL (ref 13.0–17.0)
Hemoglobin: 11.7 g/dL — ABNORMAL LOW (ref 13.0–17.0)
MCH: 30.5 pg (ref 26.0–34.0)
MCH: 30.2 pg (ref 26.0–34.0)
MCHC: 32 g/dL (ref 30.0–36.0)
MCHC: 32.2 g/dL (ref 30.0–36.0)
MCV: 95.3 fL (ref 78.0–100.0)
MCV: 93.6 fL (ref 78.0–100.0)
Platelets: 229 10*3/uL (ref 150–400)
Platelets: 223 10*3/uL (ref 150–400)
RBC: 4.29 MIL/uL (ref 4.22–5.81)
RBC: 3.88 MIL/uL — ABNORMAL LOW (ref 4.22–5.81)
RDW: 15 % (ref 11.5–15.5)
RDW: 15 % (ref 11.5–15.5)
WBC: 9.4 10*3/uL (ref 4.0–10.5)
WBC: 8.5 10*3/uL (ref 4.0–10.5)

## 2014-11-08 LAB — POCT I-STAT, CHEM 8
BUN: 43 mg/dL — ABNORMAL HIGH (ref 6–20)
CREATININE: 6.1 mg/dL — AB (ref 0.61–1.24)
Calcium, Ion: 1.25 mmol/L (ref 1.13–1.30)
Chloride: 96 mmol/L — ABNORMAL LOW (ref 101–111)
Glucose, Bld: 94 mg/dL (ref 65–99)
HCT: 40 % (ref 39.0–52.0)
HEMOGLOBIN: 13.6 g/dL (ref 13.0–17.0)
POTASSIUM: 4.6 mmol/L (ref 3.5–5.1)
Sodium: 133 mmol/L — ABNORMAL LOW (ref 135–145)
TCO2: 26 mmol/L (ref 0–100)

## 2014-11-08 LAB — PROTIME-INR
INR: 3.72 — ABNORMAL HIGH (ref 0.00–1.49)
PROTHROMBIN TIME: 36 s — AB (ref 11.6–15.2)

## 2014-11-08 LAB — LIPASE, BLOOD: Lipase: 26 U/L (ref 22–51)

## 2014-11-08 LAB — I-STAT TROPONIN, ED: Troponin i, poc: 0 ng/mL (ref 0.00–0.08)

## 2014-11-08 MED ORDER — ONDANSETRON HCL 4 MG/2ML IJ SOLN
4.0000 mg | Freq: Four times a day (QID) | INTRAMUSCULAR | Status: DC | PRN
Start: 2014-11-08 — End: 2014-11-08

## 2014-11-08 MED ORDER — CALCIUM GLUCONATE 10 % IV SOLN
1.0000 g | Freq: Once | INTRAVENOUS | Status: AC
  Administered 2014-11-08: 1 g via INTRAVENOUS
  Filled 2014-11-08: qty 10

## 2014-11-08 MED ORDER — CALCIUM GLUCONATE 10 % IV SOLN
INTRAVENOUS | Status: AC
  Filled 2014-11-08: qty 10

## 2014-11-08 MED ORDER — LIDOCAINE HCL (PF) 1 % IJ SOLN: 5.0000 mL | INTRAMUSCULAR | Status: DC | PRN

## 2014-11-08 MED ORDER — ALBUTEROL SULFATE (2.5 MG/3ML) 0.083% IN NEBU
10.0000 mg | INHALATION_SOLUTION | Freq: Once | RESPIRATORY_TRACT | Status: AC
  Administered 2014-11-08: 10 mg via RESPIRATORY_TRACT
  Filled 2014-11-08: qty 12

## 2014-11-08 MED ORDER — DOXERCALCIFEROL 4 MCG/2ML IV SOLN: 3.0000 ug | INTRAVENOUS | Status: DC

## 2014-11-08 MED ORDER — HEPARIN SODIUM (PORCINE) 1000 UNIT/ML DIALYSIS: 1000.0000 [IU] | INTRAMUSCULAR | Status: DC | PRN

## 2014-11-08 MED ORDER — CALCIUM GLUCONATE 10 % IV SOLN
1.0000 g | Freq: Once | INTRAVENOUS | Status: AC
  Administered 2014-11-08: 1 g via INTRAVENOUS

## 2014-11-08 MED ORDER — SODIUM CHLORIDE 0.9 % IV SOLN: 100.0000 mL | INTRAVENOUS | Status: DC | PRN

## 2014-11-08 MED ORDER — PENTAFLUOROPROP-TETRAFLUOROETH EX AERO
1.0000 | INHALATION_SPRAY | CUTANEOUS | Status: DC | PRN
Start: 2014-11-08 — End: 2014-11-08

## 2014-11-08 MED ORDER — ONDANSETRON HCL 4 MG PO TABS
4.0000 mg | ORAL_TABLET | Freq: Four times a day (QID) | ORAL | Status: DC | PRN
Start: 2014-11-08 — End: 2014-11-08

## 2014-11-08 MED ORDER — INSULIN ASPART 100 UNIT/ML ~~LOC~~ SOLN
SUBCUTANEOUS | Status: AC
  Filled 2014-11-08: qty 1

## 2014-11-08 MED ORDER — INSULIN ASPART 100 UNIT/ML IV SOLN
5.0000 [IU] | Freq: Once | INTRAVENOUS | Status: AC
  Administered 2014-11-08: 5 [IU] via INTRAVENOUS

## 2014-11-08 MED ORDER — INSULIN ASPART 100 UNIT/ML ~~LOC~~ SOLN: 0.0000 [IU] | Freq: Three times a day (TID) | SUBCUTANEOUS | Status: DC

## 2014-11-08 MED ORDER — SODIUM BICARBONATE 8.4 % IV SOLN
Freq: Once | INTRAVENOUS | Status: DC
  Filled 2014-11-08: qty 100

## 2014-11-08 MED ORDER — HEPARIN SODIUM (PORCINE) 1000 UNIT/ML DIALYSIS: 3000.0000 [IU] | Freq: Once | INTRAMUSCULAR | Status: DC

## 2014-11-08 MED ORDER — LIDOCAINE-PRILOCAINE 2.5-2.5 % EX CREA
1.0000 "application " | TOPICAL_CREAM | CUTANEOUS | Status: DC | PRN
  Filled 2014-11-08: qty 5

## 2014-11-08 MED ORDER — ALTEPLASE 2 MG IJ SOLR
2.0000 mg | Freq: Once | INTRAMUSCULAR | Status: DC | PRN
Start: 2014-11-08 — End: 2014-11-08
  Filled 2014-11-08: qty 2

## 2014-11-08 MED ORDER — DEXTROSE 50 % IV SOLN
1.0000 | Freq: Once | INTRAVENOUS | Status: AC
  Administered 2014-11-08: 50 mL via INTRAVENOUS
  Filled 2014-11-08: qty 50

## 2014-11-08 MED ORDER — SODIUM CHLORIDE 0.9 % IV SOLN
4.0000 g | INTRAVENOUS | Status: AC
  Administered 2014-11-08: 4 g via INTRAVENOUS
  Filled 2014-11-08: qty 40

## 2014-11-08 NOTE — Discharge Summary (Signed)
Physician Discharge Summary  Daniel Whitney VZS:[id number] DOB: [birth year] DOA: 11/08/2014  PCP: Cathlean Cower, MD  Admit date: 11/08/2014 Discharge date: 11/08/2014  Time spent: 30 minutes  Recommendations for Outpatient Follow-up:  1. INR check on 9/27 or 9/28 as INR was slightly elevated on 9/26 prior to dialysis 2. Patient requested discharge immediately postdialysis. PT eval was not performed. Patient may need an evaluation to determine if he would benefit from home health physical therapy services.  Discharge Diagnoses:  Principal Problem:   Hyperkalemia Active Problems:   Diabetes mellitus with neuropathy   Hypertension   Atrial fibrillation- failed Amiodarone   End stage renal disease on dialysis   Hypocalcemia   Weakness   Discharge Condition: Stable  Diet recommendation: Renal diet. Please limit fruit intake.  History of present illness:  Daniel Whitney is a 59 y.o. male with CAD, atrial fibrillation, diabetes and ESRD. He goes to hemodiaylsis M,W, F and is known to Dr. Marval Regal. Patient presents to ED today with weakness. No specific complaints but just felt weak over last couple of days and this am his legs "gave way". Patient called his Dialysis center early this am to reschedule to a different day. In ED patient found to have major electrolyte abnormalities, including a potassium of 7.7.  He was admitted for observation and sent urgently to be dialyzed. No SOB, chest pain or general medical complaints.   Hospital Course:  Patient received hemodialysis. His potassium was rechecked and found to be 4.6.  He requested discharge to home after dialysis.   Procedures:  Hemodialysis  Consultations:  Was seen in HD by Dr. Mercy Moore.  Discharge Exam: Filed Vitals:   11/08/14 1524 11/08/14 1600 11/08/14 1630 11/08/14 1700  BP: 137/70 122/64 114/61 114/46  Pulse: 90 98 109 117  Temp:      TempSrc:      Resp: '18 18 18 18  ' SpO2:         General: Pleasant black male  appears calm and comfortable  Eyes: PERL, normal lids, irises & conjunctiva  ENT: grossly normal hearing, lips & tongue  Neck: no LAD, masses  Cardiovascular: RRR, no m/r/g. No LE edema. Both lower extremities cold but + dorsalis pedis pulses palpable.  Respiratory: CTA bilaterally, no w/r/r. Normal respiratory effort.  Abdomen: soft, obese, ntnd  Skin: no rash or induration seen on limited exam  Musculoskeletal: grossly normal tone BUE/BLE. Dialysis access LUE.   Psychiatric: grossly normal mood and affect, speech fluent and appropriate  Neurologic: grossly non-focal.  Discharge Instructions   Discharge Instructions    Care order/instruction    Complete by:  As directed   Patient is ok to discharge to home IF his potassium is less than 5.0  and he is steady on his feet.     Increase activity slowly    Complete by:  As directed           Current Discharge Medication List    CONTINUE these medications which have NOT CHANGED   Details  acetaminophen (TYLENOL) 650 MG CR tablet Take 650 mg by mouth every 8 (eight) hours as needed for pain.    allopurinol (ZYLOPRIM) 100 MG tablet TAKE ONE TABLET BY MOUTH TWICE DAILY Qty: 180 tablet, Refills: 0    amiodarone (PACERONE) 200 MG tablet Take 1 tablet (200 mg total) by mouth daily. Qty: 90 tablet, Refills: 1    amitriptyline (ELAVIL) 50 MG tablet TAKE ONE TABLET BY MOUTH ONCE DAILY AT BEDTIME Qty: 90 tablet, Refills:  0    Blood Glucose Monitoring Suppl (ONE TOUCH ULTRA 2) W/DEVICE KIT Use to check blood sugars twice a day Dx E11.9 Qty: 1 each, Refills: 0    carvedilol (COREG) 25 MG tablet Take 25 mg by mouth 2 (two) times daily with a meal.    docusate sodium (COLACE) 100 MG capsule Take 100 mg by mouth daily.    glucose blood (ONE TOUCH ULTRA TEST) test strip 1 each by Other route 2 (two) times daily. Use to check blood sugars twice a day Dx E11.9 Qty: 100 each, Refills: 3    hydrOXYzine (ATARAX/VISTARIL) 25 MG  tablet Take 1 tablet (25 mg total) by mouth 3 (three) times daily as needed for itching. Qty: 30 tablet, Refills: 1    JANUVIA 25 MG tablet TAKE ONE TABLET BY MOUTH ONCE DAILY Qty: 90 tablet, Refills: 0    Lancets (ONETOUCH ULTRASOFT) lancets 1 each by Other route 2 (two) times daily. Use to help check blood sugars twice a day Dx E11.9 Qty: 100 each, Refills: 3    loperamide (IMODIUM A-D) 2 MG tablet Take 2 mg by mouth 4 (four) times daily as needed for diarrhea or loose stools.    ondansetron (ZOFRAN) 4 MG tablet Take 1 tablet (4 mg total) by mouth every 6 (six) hours. Qty: 30 tablet, Refills: 0    ranitidine (ZANTAC) 150 MG tablet Take 150 mg by mouth daily as needed for heartburn.    sevelamer carbonate (RENVELA) 800 MG tablet Take 1,600 mg by mouth 3 (three) times daily with meals.     simvastatin (ZOCOR) 20 MG tablet TAKE ONE TABLET BY MOUTH ONCE DAILY Qty: 90 tablet, Refills: 0    sodium chloride (OCEAN) 0.65 % SOLN nasal spray Place 1 spray into both nostrils as needed for congestion. Qty: 104 mL, Refills: 0    tadalafil (CIALIS) 20 MG tablet Take 1 tablet (20 mg total) by mouth daily as needed for erectile dysfunction. Qty: 5 tablet, Refills: 11    warfarin (COUMADIN) 3 MG tablet Please take as directed by anticoagulation clinic. Qty: 90 tablet, Refills: 0      STOP taking these medications     cetirizine (ZYRTEC) 10 MG tablet      cholestyramine (QUESTRAN) 4 G packet      OVER THE COUNTER MEDICATION      Vitamin D, Ergocalciferol, (DRISDOL) 50000 UNITS CAPS capsule        No Known Allergies    The results of significant diagnostics from this hospitalization (including imaging, microbiology, ancillary and laboratory) are listed below for reference.    Significant Diagnostic Studies: Dg Lumbar Spine Complete  10/13/2014   CLINICAL DATA:  Followup L3 compression deformity, lower back pain  EXAM: LUMBAR SPINE - COMPLETE 4+ VIEW  COMPARISON:  Lumbar spine films  of 07/31/2014  FINDINGS: There is slightly more compression of the anterior superior aspect of L3 vertebral body, now approximately 35%. No retropulsion is seen. Degenerative disc disease at L4-5 again is noted there is degenerative change of the facet joints of L4-5 and L5-S1.  IMPRESSION: 1. Slight increase in compression deformity of the anterior superior L3, now approximately 35%. 2. Degenerative disc disease at L4-5.   Electronically Signed   By: Ivar Drape M.D.   On: 10/13/2014 10:55   Dg Chest Portable 1 View  11/08/2014   CLINICAL DATA:  Weakness  EXAM: PORTABLE CHEST 1 VIEW  COMPARISON:  August 24, 2014  FINDINGS: There is no edema or  consolidation. Heart is borderline enlarged with pulmonary vascular within normal limits. No adenopathy. No pneumothorax. No bone lesions.  IMPRESSION: Heart borderline enlarged.  No edema or consolidation.   Electronically Signed   By: Lowella Grip III M.D.   On: 11/08/2014 14:46      Labs: Basic Metabolic Panel:  Recent Labs Lab 11/08/14 1404 11/08/14 1424 11/08/14 1621  NA 125* 129* 133*  K 7.6* 7.4* 4.6  CL 94* 88* 96*  CO2  --  24  --   GLUCOSE 88 129* 94  BUN 76* 69* 43*  CREATININE 10.10* 9.95* 6.10*  CALCIUM  --  9.8  --    Liver Function Tests:  Recent Labs Lab 11/08/14 1424  AST 20  ALT 17  ALKPHOS 182*  BILITOT 0.4  PROT 7.8  ALBUMIN 3.5    Recent Labs Lab 11/08/14 1424  LIPASE 26   CBC:  Recent Labs Lab 11/08/14 1404 11/08/14 1424 11/08/14 1621 11/08/14 1622  WBC  --  9.4  --  8.5  HGB 16.0 13.1 13.6 11.7*  HCT 47.0 40.9 40.0 36.3*  MCV  --  95.3  --  93.6  PLT  --  229  --  223       Signed:  Melton Alar, PA-C  [facility] 11/08/2014, 5:49 PM

## 2014-11-08 NOTE — ED Notes (Signed)
Pt transported to dialysis with darrel, EMT and this RN.

## 2014-11-08 NOTE — Progress Notes (Signed)
Discussed elevated INR with pharmacist.  Advised patient to skip dose of coumadin/warfarin today and have his INR checked tomorrow or Wednesday as he may need a dose adjustment.  Imogene Burn, PA-C [facility] Pager: [phone]

## 2014-11-08 NOTE — ED Notes (Signed)
Pt here from home via EMS for eval of weakness. Pt reports right arm weakness x 1 week that has mostly resolved and today he cannot stand. Pt is due for dialysis today. No pain reported.

## 2014-11-08 NOTE — Telephone Encounter (Signed)
Error

## 2014-11-08 NOTE — ED Provider Notes (Addendum)
CSN: [id number]     Arrival date & time 11/08/14  1347 History   First MD Initiated Contact with Patient 11/08/14 1347     Chief Complaint  Patient presents with  . Weakness     (Consider location/radiation/quality/duration/timing/severity/associated sxs/prior Treatment) Patient is a 59 y.o. male presenting with weakness. The history is provided by the patient.  Weakness This is a new problem. The current episode started 6 to 12 hours ago. The problem occurs constantly. The problem has not changed since onset.Pertinent negatives include no chest pain, no abdominal pain, no headaches and no shortness of breath. Nothing aggravates the symptoms. Nothing relieves the symptoms.    Past Medical History  Diagnosis Date  . Glaucoma   . Hypertension   . Hyperlipidemia   . Atrial fibrillation 12/30/2012  . CHF (congestive heart failure) 12/30/2012  . Diabetic retinopathy 12/30/2012  . Morbid obesity 12/30/2012  . Pulmonary hypertension 12/30/2012  . Myocardial infarction     "I've had ~ 3; last one was in ~ 10/2013" (01/30/2014)  . Diabetes mellitus with neuropathy   . OSA (obstructive sleep apnea) 12/30/2012    "lost weight; no longer needed CPAP; retested said I needed it; didn't followup cause I was feeling fine" (01/30/2014)  . GERD (gastroesophageal reflux disease)   . Stroke ~ 2005; ~ 2005    "they were mild; I didn't even notice I'd had them"; denies residual on 01/30/2014  . History of gout     "right big toe"  . Arthritis     "all over"  . Chronic lower back pain   . ESRD (end stage renal disease) on dialysis started in 2013    "MWF; [facility]; [address]" (01/30/2014)  . Refusal of blood transfusions as patient is Jehovah's Witness   . Anemia due to other cause 07/07/2014  . Allergic rhinitis 07/07/2014   Past Surgical History  Procedure Laterality Date  . Total hip arthroplasty Right 1980  . Av fistula placement Left ~ 10/2013    "forearm"  . Closed reduction hip  dislocation Right 1970's   Family History  Problem Relation Age of Onset  . Arthritis Other   . Heart disease Other   . Hyperlipidemia Other   . Stroke Other   . Hypertension Other   . Kidney disease Other   . Diabetes Other    Social History  Substance Use Topics  . Smoking status: Never Smoker   . Smokeless tobacco: Never Used  . Alcohol Use: 1.5 - 2.0 oz/week    3-4 Standard drinks or equivalent per week     Comment: 01/30/2014 "none in the last 3 weeks; primarily drink w/my friends on the weekends"    Review of Systems  Constitutional: Negative for fever.  Respiratory: Negative for shortness of breath.   Cardiovascular: Negative for chest pain.  Gastrointestinal: Positive for nausea and vomiting. Negative for abdominal pain.  Neurological: Positive for weakness. Negative for headaches.  All other systems reviewed and are negative.     Allergies  Review of patient's allergies indicates no known allergies.  Home Medications   Prior to Admission medications   Medication Sig Start Date End Date Taking? Authorizing Provider  acetaminophen (TYLENOL) 650 MG CR tablet Take 650 mg by mouth every 8 (eight) hours as needed for pain.    Historical Provider, MD  allopurinol (ZYLOPRIM) 100 MG tablet TAKE ONE TABLET BY MOUTH TWICE DAILY 06/28/14   Lyndal Pulley, DO  amiodarone (PACERONE) 200 MG tablet Take 1  tablet (200 mg total) by mouth daily. 10/08/14   Biagio Borg, MD  amitriptyline (ELAVIL) 50 MG tablet TAKE ONE TABLET BY MOUTH ONCE DAILY AT BEDTIME 08/06/14   Biagio Borg, MD  Blood Glucose Monitoring Suppl (ONE TOUCH ULTRA 2) W/DEVICE KIT Use to check blood sugars twice a day Dx E11.9 10/29/14   Biagio Borg, MD  carvedilol (COREG) 25 MG tablet Take 25 mg by mouth 2 (two) times daily with a meal.    Historical Provider, MD  cetirizine (ZYRTEC) 10 MG tablet Take 1 tablet (10 mg total) by mouth daily. Patient not taking: Reported on 10/13/2014 07/07/14   Biagio Borg, MD   cholestyramine Lucrezia Starch) 4 G packet Take 1 packet (4 g total) by mouth 2 (two) times daily. Patient not taking: Reported on 10/13/2014 08/31/14   Theodis Blaze, MD  docusate sodium (COLACE) 100 MG capsule Take 100 mg by mouth daily.    Historical Provider, MD  glucose blood (ONE TOUCH ULTRA TEST) test strip 1 each by Other route 2 (two) times daily. Use to check blood sugars twice a day Dx E11.9 10/29/14   Biagio Borg, MD  hydrOXYzine (ATARAX/VISTARIL) 25 MG tablet Take 1 tablet (25 mg total) by mouth 3 (three) times daily as needed for itching. 10/13/14   Biagio Borg, MD  JANUVIA 25 MG tablet TAKE ONE TABLET BY MOUTH ONCE DAILY 10/19/14   Biagio Borg, MD  Lancets [facility] ULTRASOFT) lancets 1 each by Other route 2 (two) times daily. Use to help check blood sugars twice a day Dx E11.9 10/29/14   Biagio Borg, MD  loperamide (IMODIUM A-D) 2 MG tablet Take 2 mg by mouth 4 (four) times daily as needed for diarrhea or loose stools.    Historical Provider, MD  ondansetron (ZOFRAN) 4 MG tablet Take 1 tablet (4 mg total) by mouth every 6 (six) hours. 08/31/14   Theodis Blaze, MD  OVER THE COUNTER MEDICATION 1 application 2 (two) times daily. Melaquin Complex    Historical Provider, MD  ranitidine (ZANTAC) 150 MG tablet Take 150 mg by mouth daily as needed for heartburn.    Historical Provider, MD  sevelamer carbonate (RENVELA) 800 MG tablet Take 1,600 mg by mouth 3 (three) times daily with meals.     Historical Provider, MD  simvastatin (ZOCOR) 20 MG tablet TAKE ONE TABLET BY MOUTH ONCE DAILY 08/06/14   Biagio Borg, MD  sodium chloride (OCEAN) 0.65 % SOLN nasal spray Place 1 spray into both nostrils as needed for congestion. 01/31/14   Shanker Kristeen Mans, MD  tadalafil (CIALIS) 20 MG tablet Take 1 tablet (20 mg total) by mouth daily as needed for erectile dysfunction. 10/27/13   Biagio Borg, MD  Vitamin D, Ergocalciferol, (DRISDOL) 50000 UNITS CAPS capsule Take 1 capsule (50,000 Units total) by mouth every 7  (seven) days. Patient not taking: Reported on 10/13/2014 08/04/14   Lyndal Pulley, DO  warfarin (COUMADIN) 3 MG tablet Please take as directed by anticoagulation clinic. 10/11/14   Biagio Borg, MD   BP 137/70 mmHg  Pulse 90  Temp(Src) 97.7 F (36.5 C) (Oral)  Resp 18  SpO2 100% Physical Exam  Constitutional: He is oriented to person, place, and time. He appears well-developed and well-nourished. No distress.  HENT:  Head: Normocephalic and atraumatic.  Mouth/Throat: No oropharyngeal exudate.  Eyes: EOM are normal. Pupils are equal, round, and reactive to light.  Neck: Normal range of  motion. Neck supple.  Cardiovascular: Normal rate and regular rhythm.  Exam reveals no friction rub.   No murmur heard. Pulmonary/Chest: Effort normal and breath sounds normal. No respiratory distress. He has no wheezes. He has no rales.  Abdominal: He exhibits no distension. There is no tenderness. There is no rebound.  Musculoskeletal: Normal range of motion. He exhibits no edema.  Neurological: He is alert and oriented to person, place, and time.  Skin: He is not diaphoretic.  Nursing note and vitals reviewed.   ED Course  Procedures (including critical care time) Labs Review Labs Reviewed  COMPREHENSIVE METABOLIC PANEL - Abnormal; Notable for the following:    Sodium 129 (*)    Potassium 7.4 (*)    Chloride 88 (*)    Glucose, Bld 129 (*)    BUN 69 (*)    Creatinine, Ser 9.95 (*)    Alkaline Phosphatase 182 (*)    GFR calc non Af Amer 5 (*)    GFR calc Af Amer 6 (*)    Anion gap 17 (*)    All other components within normal limits  I-STAT CHEM 8, ED - Abnormal; Notable for the following:    Sodium 125 (*)    Potassium 7.6 (*)    Chloride 94 (*)    BUN 76 (*)    Creatinine, Ser 10.10 (*)    Calcium, Ion 0.89 (*)    All other components within normal limits  CBC  LIPASE, BLOOD  RENAL FUNCTION PANEL  CBC  PROTIME-INR  I-STAT TROPOININ, ED    Imaging Review Dg Chest Portable 1  View  11/08/2014   CLINICAL DATA:  Weakness  EXAM: PORTABLE CHEST 1 VIEW  COMPARISON:  August 24, 2014  FINDINGS: There is no edema or consolidation. Heart is borderline enlarged with pulmonary vascular within normal limits. No adenopathy. No pneumothorax. No bone lesions.  IMPRESSION: Heart borderline enlarged.  No edema or consolidation.   Electronically Signed   By: Lowella Grip III M.D.   On: 11/08/2014 14:46   I have personally reviewed and evaluated these images and lab results as part of my medical decision-making.  EKG #2  EKG Interpretation  Date/Time:  Monday November 08 2014 14:29:57 EDT Ventricular Rate:  77 PR Interval:    QRS Duration: 158 QT Interval:  430 QTC Calculation: 487 R Axis:   -133 Text Interpretation:  Atrial fibrillation Nonspecific intraventricular conduction delay Probable anterolateral infarct, old QRS improved Confirmed by Mingo Amber  MD, [place] ([id number]) on 11/08/2014 3:53:47 PM      EKG #1  EKG Interpretation   Date/Time:  Monday November 08 2014 13:55:14 EDT Ventricular Rate:  79 PR Interval:    QRS Duration: 195 QT Interval:  554 QTC Calculation: 635 R Axis:   -125 Text Interpretation:  Atrial fibrillation Ventricular premature complex  Right bundle branch block Widened QRS - new Confirmed by Mingo Amber  MD, Macyn Remmert  ([id number]) on 11/08/2014 1:57:12 PM       CRITICAL CARE Performed by: Osvaldo Shipper   Total critical care time: 45 minutes  Critical care time was exclusive of separately billable procedures and treating other patients.  Critical care was necessary to treat or prevent imminent or life-threatening deterioration.  Critical care was time spent personally by me on the following activities: development of treatment plan with patient and/or surrogate as well as nursing, discussions with consultants, evaluation of patient's response to treatment, examination of patient, obtaining history from patient or surrogate, ordering and performing  treatments and interventions, ordering and review of laboratory studies, ordering and review of radiographic studies, pulse oximetry and re-evaluation of patient's condition.   MDM   Final diagnoses:  Acute hyperkalemia  Weakness    59 year old male here with weakness. Had dialysis this afternoon, but couldn't make it because he was so weak. He denies any chest pain, vomiting, but is nauseated and is retching. No diarrhea. No fever. He states he felt like this previously and had a cardiac arrest. He hasn't missed any dialysis recently. With EMS, his EKG show a very widened QRS. With history of dialysis, concern for possible hyperkalemia. Calcium ordered and given. EKG shows QRS of almost 200. His initial i-STAT potassium was 7.6. Patient given insulin, D50, albuterol, bicarbonate. Multiple boluses of calcium gluconate given to help improve his QRS down to 158 with improvement in EKG. Dr. Justin Mend with nephrology consulted and will emergently dialyze. Patient admitted to hospitalist.    Evelina Bucy, MD 11/08/14 1555  Evelina Bucy, MD 11/08/14 [phone]

## 2014-11-08 NOTE — Discharge Instructions (Signed)
Please have your INR (coumadin level) checked in the next 1-2 days.  Your level was slightly elevated in the hospital, and you will likely need your dose adjusted.  Please follow a renal diet (limited fruit - limited watermelon) as this made your potassium very high today.  A high potassium level is very dangerous.

## 2014-11-08 NOTE — H&P (Signed)
[facility] History and Physical  Daniel Whitney WGN:[id number] DOB: 25-Oct-1955 DOA: 11/08/2014  Referring physician: Dr. Mingo Amber ( EDP) PCP: Cathlean Cower, MD   Chief Complaint: Weakness, ESRD.   HPI: Daniel Whitney is a 59 y.o. male with multiple medical problems not limited to CAD, atrial fibrillation, diabetes and ESRD. He goes to hemodiaylsis M,W, F and is known to Dr. Marval Regal.  Patient presents to ED today with weakness. No specific complaints but just felt weak over last couple of days and this am his legs "gave way". Patient called Dialysis early this am to reschedule to a different day. In ED patient found to have major electrolyte abnormalities, he is being sent urgently to dialysis. No SOB, chest pain or general medical complaints.   ED studies: K+ 7.4, Na+ 125. ICa+ 0.89. CBC normal. Troponin negative  Review of Systems:  Constitutional:  No weight loss, night sweats, fevers, chills HEENT:  No headaches, Difficulty swallowing,Tooth/dental problems,Sore throat,  No sneezing, itching, ear ache, nasal congestion, post nasal drip,  Cardio-vascular:  No chest pain, Orthopnea, PND, swelling in lower extremities, anasarca, dizziness, palpitations  GI:  No heartburn, indigestion, abdominal pain, +nausea, no vomiting, diarrhea, change in bowel habits, loss of appetite  Resp:  No shortness of breath with exertion or at rest. No excess mucus, no productive cough, No non-productive cough, No coughing up of blood.No change in color of mucus.No wheezing.No chest wall deformity  Skin:  no rash or lesions.  GU:  no dysuria, change in color of urine, no urgency or frequency. No flank pain.  Musculoskeletal:  No joint pain or swelling. No decreased range of motion. No back pain.  Psych:  No change in mood or affect. No depression or anxiety. No memory loss.   Past Medical History  Diagnosis Date  . Glaucoma   . Hypertension   . Hyperlipidemia   . Atrial fibrillation 12/30/2012  .  CHF (congestive heart failure) 12/30/2012  . Diabetic retinopathy 12/30/2012  . Morbid obesity 12/30/2012  . Pulmonary hypertension 12/30/2012  . Myocardial infarction     "I've had ~ 3; last one was in ~ 10/2013" (01/30/2014)  . Diabetes mellitus with neuropathy   . OSA (obstructive sleep apnea) 12/30/2012    "lost weight; no longer needed CPAP; retested said I needed it; didn't followup cause I was feeling fine" (01/30/2014)  . GERD (gastroesophageal reflux disease)   . Stroke ~ 2005; ~ 2005    "they were mild; I didn't even notice I'd had them"; denies residual on 01/30/2014  . History of gout     "right big toe"  . Arthritis     "all over"  . Chronic lower back pain   . ESRD (end stage renal disease) on dialysis started in 2013    "MWF; [facility]; [address]" (01/30/2014)  . Refusal of blood transfusions as patient is Jehovah's Witness   . Anemia due to other cause 07/07/2014  . Allergic rhinitis 07/07/2014   Past Surgical History  Procedure Laterality Date  . Total hip arthroplasty Right 1980  . Av fistula placement Left ~ 10/2013    "forearm"  . Closed reduction hip dislocation Right 1970's   Social History:  reports that he has never smoked. He has never used smokeless tobacco. He reports that he drinks about 1.5 - 2.0 oz of alcohol per week. He reports that he does not use illicit drugs.  No Known Allergies  Family History  Problem Relation Age of Onset  . Arthritis Other   .  Heart disease Other   . Hyperlipidemia Other   . Stroke Other   . Hypertension Other   . Kidney disease Other   . Diabetes Other     Pharmacy med rec not completed at time of admission.    Prior to Admission medications   Medication Sig Start Date End Date Taking? Authorizing Provider  acetaminophen (TYLENOL) 650 MG CR tablet Take 650 mg by mouth every 8 (eight) hours as needed for pain.    Historical Provider, MD  allopurinol (ZYLOPRIM) 100 MG tablet TAKE ONE TABLET BY MOUTH TWICE DAILY  06/28/14   Lyndal Pulley, DO  amiodarone (PACERONE) 200 MG tablet Take 1 tablet (200 mg total) by mouth daily. 10/08/14   Biagio Borg, MD  amitriptyline (ELAVIL) 50 MG tablet TAKE ONE TABLET BY MOUTH ONCE DAILY AT BEDTIME 08/06/14   Biagio Borg, MD  Blood Glucose Monitoring Suppl (ONE TOUCH ULTRA 2) W/DEVICE KIT Use to check blood sugars twice a day Dx E11.9 10/29/14   Biagio Borg, MD  carvedilol (COREG) 25 MG tablet Take 25 mg by mouth 2 (two) times daily with a meal.    Historical Provider, MD  cetirizine (ZYRTEC) 10 MG tablet Take 1 tablet (10 mg total) by mouth daily. Patient not taking: Reported on 10/13/2014 07/07/14   Biagio Borg, MD  cholestyramine Lucrezia Starch) 4 G packet Take 1 packet (4 g total) by mouth 2 (two) times daily. Patient not taking: Reported on 10/13/2014 08/31/14   Theodis Blaze, MD  docusate sodium (COLACE) 100 MG capsule Take 100 mg by mouth daily.    Historical Provider, MD  glucose blood (ONE TOUCH ULTRA TEST) test strip 1 each by Other route 2 (two) times daily. Use to check blood sugars twice a day Dx E11.9 10/29/14   Biagio Borg, MD  hydrOXYzine (ATARAX/VISTARIL) 25 MG tablet Take 1 tablet (25 mg total) by mouth 3 (three) times daily as needed for itching. 10/13/14   Biagio Borg, MD  JANUVIA 25 MG tablet TAKE ONE TABLET BY MOUTH ONCE DAILY 10/19/14   Biagio Borg, MD  Lancets [facility] ULTRASOFT) lancets 1 each by Other route 2 (two) times daily. Use to help check blood sugars twice a day Dx E11.9 10/29/14   Biagio Borg, MD  loperamide (IMODIUM A-D) 2 MG tablet Take 2 mg by mouth 4 (four) times daily as needed for diarrhea or loose stools.    Historical Provider, MD  ondansetron (ZOFRAN) 4 MG tablet Take 1 tablet (4 mg total) by mouth every 6 (six) hours. 08/31/14   Theodis Blaze, MD  OVER THE COUNTER MEDICATION 1 application 2 (two) times daily. Melaquin Complex    Historical Provider, MD  ranitidine (ZANTAC) 150 MG tablet Take 150 mg by mouth daily as needed for  heartburn.    Historical Provider, MD  sevelamer carbonate (RENVELA) 800 MG tablet Take 1,600 mg by mouth 3 (three) times daily with meals.     Historical Provider, MD  simvastatin (ZOCOR) 20 MG tablet TAKE ONE TABLET BY MOUTH ONCE DAILY 08/06/14   Biagio Borg, MD  sodium chloride (OCEAN) 0.65 % SOLN nasal spray Place 1 spray into both nostrils as needed for congestion. 01/31/14   Shanker Kristeen Mans, MD  tadalafil (CIALIS) 20 MG tablet Take 1 tablet (20 mg total) by mouth daily as needed for erectile dysfunction. 10/27/13   Biagio Borg, MD  Vitamin D, Ergocalciferol, (DRISDOL) 50000 UNITS CAPS capsule Take  1 capsule (50,000 Units total) by mouth every 7 (seven) days. Patient not taking: Reported on 10/13/2014 08/04/14   Lyndal Pulley, DO  warfarin (COUMADIN) 3 MG tablet Please take as directed by anticoagulation clinic. 10/11/14   Biagio Borg, MD   Physical Exam: Filed Vitals:   11/08/14 1403  Pulse: 78  Resp: 22  SpO2: 100%    Wt Readings from Last 3 Encounters:  10/13/14 112.038 kg (247 lb)  10/13/14 112.038 kg (247 lb)  08/31/14 103.1 kg (227 lb 4.7 oz)    General:  Pleasant black male appears calm and comfortable Eyes: PERL, normal lids, irises & conjunctiva ENT: grossly normal hearing, lips & tongue Neck: no LAD, masses Cardiovascular: RRR, no m/r/g. No LE edema. Both lower extremities cold but + dorsalis pedis pulses palpable.  Respiratory: CTA bilaterally, no w/r/r. Normal respiratory effort. Abdomen: soft, obese,  ntnd Skin: no rash or induration seen on limited exam Musculoskeletal: grossly normal tone BUE/BLE. Dialysis access LUE.  Psychiatric: grossly normal mood and affect, speech fluent and appropriate Neurologic: grossly non-focal.          Labs on Admission:  Basic Metabolic Panel:  Recent Labs Lab 11/08/14 1404  NA 125*  K 7.6*  CL 94*  GLUCOSE 88  BUN 76*  CREATININE 10.10*   Liver Function Tests: No results for input(s): AST, ALT, ALKPHOS, BILITOT,  PROT, ALBUMIN in the last 168 hours. No results for input(s): LIPASE, AMYLASE in the last 168 hours. No results for input(s): AMMONIA in the last 168 hours. CBC:  Recent Labs Lab 11/08/14 1404  HGB 16.0  HCT 47.0   Cardiac Enzymes: Troponin 0.0  EKG:  Initial EKG at 1:55pm revealed atrial fibrillation Ventricular premature complex Right bundle branch block.   Repeat EKG 2:30pm revealed atrial fibrillation Nonspecific intraventricular conduction delay Probable anterolateral infarct, old QRS improved  Assessment/Plan Principal Problem:   Hyperkalemia Active Problems:   Diabetes mellitus with neuropathy   Hypertension   Atrial fibrillation- failed Amiodarone   End stage renal disease on dialysis   Hypocalcemia   Weakness  Weakness. No specific complaints. He has electrolytes disturbances. Recheck electrolytes later today. Will obtain PT evaluation. Following dialysis if patient feels better and repeat labs are acceptable patient could possibly be discharged this evening.   Hyperkalemia. Patient admits to eating large amounts of fruit lately, especially grapefruit and watermelon. Urgently sent to dialysis. Will recheck electrolytes after dialysis. Cardiac monitoring in progress.   Hypocalcemia. Repletion by EDP. Cardiac monitoring in progress.   Atrial fibrillation, on chronic coumadin. Will order INR and make decision about dosing of coumadin once results available.   HTN. Stable.  Diabetes. Not hyperglycemic at present. Will monitor CBG, give insulin as needed. Hold home oral hypoglycemics     Code Status: Full code.  DVT Prophylaxis: On coumadin Family Communication: Patient alert, oriented and understands plan of care.   Disposition Plan: expect discharge home in 24-48 hours.    Time spent: 60 minutes  Tye Savoy, NP [facility] Pager [phone]

## 2014-11-08 NOTE — Procedures (Signed)
Pt seen on HD.  On 1K bath.  Ap 130 Vp 180 BFR 400.  He says he will be able to go home after HD.

## 2014-11-08 NOTE — Consult Note (Signed)
[facility] Renal Consultation Note    Indication for Consultation:  Management of ESRD/hemodialysis; anemia, hypertension/volume and secondary hyperparathyroidism PCP: Cathlean Cower, MD  HPI: Daniel Whitney is a 59 y.o. male with ESRD on hemodialysis at [facility] M,W,F. PATIENT IS A [name]-DOES NOT RECEIVE BLOOD PRODUCTS!  PMH is significant for HTN, DM, Atrial Fibrillation, CHF, pulmonary hypertension, MI, OSA, CVA, anemia, secondary hyperparathyroidism, GERD, Gout, anemia, arthritis. Of note: Patient was admitted to [facility] 40/81/4481 for asystolic cardiac arrest and was treated with arctic sun protocol.  Patient recovered, was extubated then had second PEA arrest while in hospital.  He was subsequently treated for ARDS, respiratory failure and septic shock.  Patient presents to ED today with C/O weakness and being unable to lift legs. K+ is 7.6. Patient states he ate pineapple and watermelon over the weekend. Initial EKG on arrival to ED shows atrial fibrillation with widened QRS. Patient received 1 amp Ca+Gluconate IV, 1 amp D50W IV, 5 units Novolog IV, 1 amp NaHCO3 IV per ED physician. Now preparing for emergent HD with 1.0 K bath. Patient is awake, alert, denies C/O chest pain, SOB, Fever, chills nausea, vomiting, diarrhea. Denies abdominal pain, flank pain. Patient stated he was "a little nauseated" but this passed without treatment.   Patient attends [facility], usually does not miss treatments, did end treatment 30 minutes early last Friday. Last incenter values: HGB 11.8 (11/03/14) Ca 9.3 C Ca 10.0 Phos 4.0 PTH 292 ( 10/07/14).  Past Medical History  Diagnosis Date  . Glaucoma   . Hypertension   . Hyperlipidemia   . Atrial fibrillation 12/30/2012  . CHF (congestive heart failure) 12/30/2012  . Diabetic retinopathy 12/30/2012  . Morbid obesity 12/30/2012  . Pulmonary hypertension 12/30/2012  . Myocardial infarction     "I've had ~ 3; last one was in ~  10/2013" (01/30/2014)  . Diabetes mellitus with neuropathy   . OSA (obstructive sleep apnea) 12/30/2012    "lost weight; no longer needed CPAP; retested said I needed it; didn't followup cause I was feeling fine" (01/30/2014)  . GERD (gastroesophageal reflux disease)   . Stroke ~ 2005; ~ 2005    "they were mild; I didn't even notice I'd had them"; denies residual on 01/30/2014  . History of gout     "right big toe"  . Arthritis     "all over"  . Chronic lower back pain   . ESRD (end stage renal disease) on dialysis started in 2013    "MWF; [facility]; [address]" (01/30/2014)  . Refusal of blood transfusions as patient is Jehovah's Witness   . Anemia due to other cause 07/07/2014  . Allergic rhinitis 07/07/2014   Past Surgical History  Procedure Laterality Date  . Total hip arthroplasty Right 1980  . Av fistula placement Left ~ 10/2013    "forearm"  . Closed reduction hip dislocation Right 1970's   Family History  Problem Relation Age of Onset  . Arthritis Other   . Heart disease Other   . Hyperlipidemia Other   . Stroke Other   . Hypertension Other   . Kidney disease Other   . Diabetes Other    Social History:  reports that he has never smoked. He has never used smokeless tobacco. He reports that he drinks about 1.5 - 2.0 oz of alcohol per week. He reports that he does not use illicit drugs. No Known Allergies Prior to Admission medications   Medication Sig Start Date End Date Taking?  Authorizing Provider  acetaminophen (TYLENOL) 650 MG CR tablet Take 650 mg by mouth every 8 (eight) hours as needed for pain.    Historical Provider, MD  allopurinol (ZYLOPRIM) 100 MG tablet TAKE ONE TABLET BY MOUTH TWICE DAILY 06/28/14   Lyndal Pulley, DO  amiodarone (PACERONE) 200 MG tablet Take 1 tablet (200 mg total) by mouth daily. 10/08/14   Biagio Borg, MD  amitriptyline (ELAVIL) 50 MG tablet TAKE ONE TABLET BY MOUTH ONCE DAILY AT BEDTIME 08/06/14   Biagio Borg, MD  Blood Glucose  Monitoring Suppl (ONE TOUCH ULTRA 2) W/DEVICE KIT Use to check blood sugars twice a day Dx E11.9 10/29/14   Biagio Borg, MD  carvedilol (COREG) 25 MG tablet Take 25 mg by mouth 2 (two) times daily with a meal.    Historical Provider, MD  cetirizine (ZYRTEC) 10 MG tablet Take 1 tablet (10 mg total) by mouth daily. Patient not taking: Reported on 10/13/2014 07/07/14   Biagio Borg, MD  cholestyramine Lucrezia Starch) 4 G packet Take 1 packet (4 g total) by mouth 2 (two) times daily. Patient not taking: Reported on 10/13/2014 08/31/14   Theodis Blaze, MD  docusate sodium (COLACE) 100 MG capsule Take 100 mg by mouth daily.    Historical Provider, MD  glucose blood (ONE TOUCH ULTRA TEST) test strip 1 each by Other route 2 (two) times daily. Use to check blood sugars twice a day Dx E11.9 10/29/14   Biagio Borg, MD  hydrOXYzine (ATARAX/VISTARIL) 25 MG tablet Take 1 tablet (25 mg total) by mouth 3 (three) times daily as needed for itching. 10/13/14   Biagio Borg, MD  JANUVIA 25 MG tablet TAKE ONE TABLET BY MOUTH ONCE DAILY 10/19/14   Biagio Borg, MD  Lancets [facility] ULTRASOFT) lancets 1 each by Other route 2 (two) times daily. Use to help check blood sugars twice a day Dx E11.9 10/29/14   Biagio Borg, MD  loperamide (IMODIUM A-D) 2 MG tablet Take 2 mg by mouth 4 (four) times daily as needed for diarrhea or loose stools.    Historical Provider, MD  ondansetron (ZOFRAN) 4 MG tablet Take 1 tablet (4 mg total) by mouth every 6 (six) hours. 08/31/14   Theodis Blaze, MD  OVER THE COUNTER MEDICATION 1 application 2 (two) times daily. Melaquin Complex    Historical Provider, MD  ranitidine (ZANTAC) 150 MG tablet Take 150 mg by mouth daily as needed for heartburn.    Historical Provider, MD  sevelamer carbonate (RENVELA) 800 MG tablet Take 1,600 mg by mouth 3 (three) times daily with meals.     Historical Provider, MD  simvastatin (ZOCOR) 20 MG tablet TAKE ONE TABLET BY MOUTH ONCE DAILY 08/06/14   Biagio Borg, MD  sodium  chloride (OCEAN) 0.65 % SOLN nasal spray Place 1 spray into both nostrils as needed for congestion. 01/31/14   Shanker Kristeen Mans, MD  tadalafil (CIALIS) 20 MG tablet Take 1 tablet (20 mg total) by mouth daily as needed for erectile dysfunction. 10/27/13   Biagio Borg, MD  Vitamin D, Ergocalciferol, (DRISDOL) 50000 UNITS CAPS capsule Take 1 capsule (50,000 Units total) by mouth every 7 (seven) days. Patient not taking: Reported on 10/13/2014 08/04/14   Lyndal Pulley, DO  warfarin (COUMADIN) 3 MG tablet Please take as directed by anticoagulation clinic. 10/11/14   Biagio Borg, MD   Current Facility-Administered Medications  Medication Dose Route Frequency Provider Last Rate Last  Dose  . 0.9 %  sodium chloride infusion  100 mL Intravenous PRN Valentina Gu, NP      . 0.9 %  sodium chloride infusion  100 mL Intravenous PRN Valentina Gu, NP      . alteplase (CATHFLO ACTIVASE) injection 2 mg  2 mg Intracatheter Once PRN Valentina Gu, NP      . calcium gluconate 4 g in sodium chloride 0.9 % 100 mL IVPB  4 g Intravenous STAT Evelina Bucy, MD 140 mL/hr at 11/08/14 1425 4 g at 11/08/14 1425  . [START ON 11/10/2014] doxercalciferol (HECTOROL) injection 3 mcg  3 mcg Intravenous Q M,W,F-HD Valentina Gu, NP      . heparin injection 1,000 Units  1,000 Units Dialysis PRN Valentina Gu, NP      . heparin injection 3,000 Units  3,000 Units Dialysis Once in dialysis Valentina Gu, NP      . lidocaine (PF) (XYLOCAINE) 1 % injection 5 mL  5 mL Intradermal PRN Valentina Gu, NP      . lidocaine-prilocaine (EMLA) cream 1 application  1 application Topical PRN Valentina Gu, NP      . pentafluoroprop-tetrafluoroeth (GEBAUERS) aerosol 1 application  1 application Topical PRN Valentina Gu, NP      . sodium bicarbonate 100 mEq in dextrose 5 % 1,000 mL infusion   Intravenous Once Evelina Bucy, MD       Current Outpatient Prescriptions  Medication Sig Dispense Refill   . acetaminophen (TYLENOL) 650 MG CR tablet Take 650 mg by mouth every 8 (eight) hours as needed for pain.    Marland Kitchen allopurinol (ZYLOPRIM) 100 MG tablet TAKE ONE TABLET BY MOUTH TWICE DAILY 180 tablet 0  . amiodarone (PACERONE) 200 MG tablet Take 1 tablet (200 mg total) by mouth daily. 90 tablet 1  . amitriptyline (ELAVIL) 50 MG tablet TAKE ONE TABLET BY MOUTH ONCE DAILY AT BEDTIME 90 tablet 0  . Blood Glucose Monitoring Suppl (ONE TOUCH ULTRA 2) W/DEVICE KIT Use to check blood sugars twice a day Dx E11.9 1 each 0  . carvedilol (COREG) 25 MG tablet Take 25 mg by mouth 2 (two) times daily with a meal.    . cetirizine (ZYRTEC) 10 MG tablet Take 1 tablet (10 mg total) by mouth daily. (Patient not taking: Reported on 10/13/2014) 30 tablet 11  . cholestyramine (QUESTRAN) 4 G packet Take 1 packet (4 g total) by mouth 2 (two) times daily. (Patient not taking: Reported on 10/13/2014) 60 each 1  . docusate sodium (COLACE) 100 MG capsule Take 100 mg by mouth daily.    Marland Kitchen glucose blood (ONE TOUCH ULTRA TEST) test strip 1 each by Other route 2 (two) times daily. Use to check blood sugars twice a day Dx E11.9 100 each 3  . hydrOXYzine (ATARAX/VISTARIL) 25 MG tablet Take 1 tablet (25 mg total) by mouth 3 (three) times daily as needed for itching. 30 tablet 1  . JANUVIA 25 MG tablet TAKE ONE TABLET BY MOUTH ONCE DAILY 90 tablet 0  . Lancets (ONETOUCH ULTRASOFT) lancets 1 each by Other route 2 (two) times daily. Use to help check blood sugars twice a day Dx E11.9 100 each 3  . loperamide (IMODIUM A-D) 2 MG tablet Take 2 mg by mouth 4 (four) times daily as needed for diarrhea or loose stools.    . ondansetron (ZOFRAN) 4 MG tablet Take 1 tablet (4 mg total) by mouth every 6 (six) hours.  30 tablet 0  . OVER THE COUNTER MEDICATION 1 application 2 (two) times daily. Melaquin Complex    . ranitidine (ZANTAC) 150 MG tablet Take 150 mg by mouth daily as needed for heartburn.    . sevelamer carbonate (RENVELA) 800 MG tablet  Take 1,600 mg by mouth 3 (three) times daily with meals.     . simvastatin (ZOCOR) 20 MG tablet TAKE ONE TABLET BY MOUTH ONCE DAILY 90 tablet 0  . sodium chloride (OCEAN) 0.65 % SOLN nasal spray Place 1 spray into both nostrils as needed for congestion. 104 mL 0  . tadalafil (CIALIS) 20 MG tablet Take 1 tablet (20 mg total) by mouth daily as needed for erectile dysfunction. 5 tablet 11  . Vitamin D, Ergocalciferol, (DRISDOL) 50000 UNITS CAPS capsule Take 1 capsule (50,000 Units total) by mouth every 7 (seven) days. (Patient not taking: Reported on 10/13/2014) 8 capsule 0  . warfarin (COUMADIN) 3 MG tablet Please take as directed by anticoagulation clinic. 90 tablet 0   Labs: Basic Metabolic Panel:  Recent Labs Lab 11/08/14 1404  NA 125*  K 7.6*  CL 94*  GLUCOSE 88  BUN 76*  CREATININE 10.10*   Liver Function Tests: No results for input(s): AST, ALT, ALKPHOS, BILITOT, PROT, ALBUMIN in the last 168 hours. No results for input(s): LIPASE, AMYLASE in the last 168 hours. No results for input(s): AMMONIA in the last 168 hours. CBC:  Recent Labs Lab 11/08/14 1404 11/08/14 1424  WBC  --  9.4  HGB 16.0 13.1  HCT 47.0 40.9  MCV  --  95.3  PLT  --  229   Cardiac Enzymes: No results for input(s): CKTOTAL, CKMB, CKMBINDEX, TROPONINI in the last 168 hours. CBG: No results for input(s): GLUCAP in the last 168 hours. Iron Studies: No results for input(s): IRON, TIBC, TRANSFERRIN, FERRITIN in the last 72 hours. Studies/Results: No results found.  ROS: As per HPI otherwise negative.  Physical Exam: Filed Vitals:   11/08/14 1403  Pulse: 78  Resp: 22  SpO2: 100%     General: Well developed, well nourished, in no acute distress. Head: Normocephalic, atraumatic, sclera non-icteric, mucus membranes are moist Neck: Supple. JVD not elevated. Lungs: Clear bilaterally to auscultation slightly decreased in bases. Breathing is unlabored. Heart: Atrial fib on monitor. HS irregular, distant.   Abdomen: Soft, non-tender, non-distended with normoactive bowel sounds. No rebound/guarding. No obvious abdominal masses. M-S:  Strength and tone appear normal for age. Lower extremities: 1+ dependent pitting edema bilaterally. Has two healing ulcerated areas lateral aspect of RLE without redness.  Neuro: Alert and oriented X 3. Moves all extremities spontaneously. Psych:  Responds to questions appropriately with a normal affect. Dialysis Access:  Dialysis Orders: Center: [facility] on MWF EDW 108 kg HD Bath 2.0 K 2.0 Ca  Time 4 hours  Heparin 3000 units per tx. Access LFA AVF BFR 450 DFR 800    Hectoral 3 mcg IV q MWF with HD (last dose 11/05/14)   Assessment/Plan: 1.  Hyperkalemia: K+ 7.6. Will have emergent HD with 1.0 K bath. Recheck K+ after one hour.  2.  ESRD - MWF at [facility]. Was unable to go to HD today due to weakness. K 3.  Hypertension/volume  - BP 131/71. Wt deferred due to patient being on stretcher, unable to stand due to weakness in legs. 4.  Anemia  - HGB 13.1. No ESA therapy OP.  5.  Metabolic bone disease - on hectoral q treatment.  6. Nutrition: Last incenter Albumin 3.1 (10/07/14). NPO at present.  7.  Nutrition - NPO at present.   Rita H. Owens Shark, NP-C 11/08/2014, 2:45 PM  [employer] [phone] I have seen and examined this patient and agree with plan per Juanell Fairly.  59yo BM with ESRD on HD MWF came to ER with CO muscle weakness and found to have K 7.4.  Admits to dietary indiscretion over the weekend.  Will plan emergent HD and then could be DC after HD. MATTINGLY,MICHAEL T,MD 11/08/2014 4:20 PM

## 2014-11-10 ENCOUNTER — Ambulatory Visit: Payer: Medicare Other | Admitting: Internal Medicine

## 2014-11-10 DIAGNOSIS — I5020 Unspecified systolic (congestive) heart failure: Secondary | ICD-10-CM | POA: Diagnosis not present

## 2014-11-10 DIAGNOSIS — D509 Iron deficiency anemia, unspecified: Secondary | ICD-10-CM | POA: Diagnosis not present

## 2014-11-10 DIAGNOSIS — N186 End stage renal disease: Secondary | ICD-10-CM | POA: Diagnosis not present

## 2014-11-10 DIAGNOSIS — D631 Anemia in chronic kidney disease: Secondary | ICD-10-CM | POA: Diagnosis not present

## 2014-11-10 DIAGNOSIS — E1122 Type 2 diabetes mellitus with diabetic chronic kidney disease: Secondary | ICD-10-CM | POA: Diagnosis not present

## 2014-11-10 DIAGNOSIS — Z23 Encounter for immunization: Secondary | ICD-10-CM | POA: Diagnosis not present

## 2014-11-10 DIAGNOSIS — E876 Hypokalemia: Secondary | ICD-10-CM | POA: Diagnosis not present

## 2014-11-10 DIAGNOSIS — N2581 Secondary hyperparathyroidism of renal origin: Secondary | ICD-10-CM | POA: Diagnosis not present

## 2014-11-12 DIAGNOSIS — Z992 Dependence on renal dialysis: Secondary | ICD-10-CM | POA: Diagnosis not present

## 2014-11-12 DIAGNOSIS — N2581 Secondary hyperparathyroidism of renal origin: Secondary | ICD-10-CM | POA: Diagnosis not present

## 2014-11-12 DIAGNOSIS — N186 End stage renal disease: Secondary | ICD-10-CM | POA: Diagnosis not present

## 2014-11-12 DIAGNOSIS — D509 Iron deficiency anemia, unspecified: Secondary | ICD-10-CM | POA: Diagnosis not present

## 2014-11-12 DIAGNOSIS — E876 Hypokalemia: Secondary | ICD-10-CM | POA: Diagnosis not present

## 2014-11-12 DIAGNOSIS — I120 Hypertensive chronic kidney disease with stage 5 chronic kidney disease or end stage renal disease: Secondary | ICD-10-CM | POA: Diagnosis not present

## 2014-11-12 DIAGNOSIS — Z23 Encounter for immunization: Secondary | ICD-10-CM | POA: Diagnosis not present

## 2014-11-12 DIAGNOSIS — D631 Anemia in chronic kidney disease: Secondary | ICD-10-CM | POA: Diagnosis not present

## 2014-11-16 DIAGNOSIS — E876 Hypokalemia: Secondary | ICD-10-CM | POA: Diagnosis not present

## 2014-11-16 DIAGNOSIS — N2581 Secondary hyperparathyroidism of renal origin: Secondary | ICD-10-CM | POA: Diagnosis not present

## 2014-11-16 DIAGNOSIS — N186 End stage renal disease: Secondary | ICD-10-CM | POA: Diagnosis not present

## 2014-11-17 DIAGNOSIS — E876 Hypokalemia: Secondary | ICD-10-CM | POA: Diagnosis not present

## 2014-11-17 DIAGNOSIS — N186 End stage renal disease: Secondary | ICD-10-CM | POA: Diagnosis not present

## 2014-11-17 DIAGNOSIS — N2581 Secondary hyperparathyroidism of renal origin: Secondary | ICD-10-CM | POA: Diagnosis not present

## 2014-11-19 DIAGNOSIS — N2581 Secondary hyperparathyroidism of renal origin: Secondary | ICD-10-CM | POA: Diagnosis not present

## 2014-11-19 DIAGNOSIS — E876 Hypokalemia: Secondary | ICD-10-CM | POA: Diagnosis not present

## 2014-11-19 DIAGNOSIS — N186 End stage renal disease: Secondary | ICD-10-CM | POA: Diagnosis not present

## 2014-11-22 ENCOUNTER — Other Ambulatory Visit: Payer: Self-pay | Admitting: Internal Medicine

## 2014-11-22 DIAGNOSIS — E876 Hypokalemia: Secondary | ICD-10-CM | POA: Diagnosis not present

## 2014-11-22 DIAGNOSIS — N186 End stage renal disease: Secondary | ICD-10-CM | POA: Diagnosis not present

## 2014-11-22 DIAGNOSIS — N2581 Secondary hyperparathyroidism of renal origin: Secondary | ICD-10-CM | POA: Diagnosis not present

## 2014-11-23 ENCOUNTER — Other Ambulatory Visit: Payer: Self-pay | Admitting: Internal Medicine

## 2014-11-24 DIAGNOSIS — E876 Hypokalemia: Secondary | ICD-10-CM | POA: Diagnosis not present

## 2014-11-24 DIAGNOSIS — N186 End stage renal disease: Secondary | ICD-10-CM | POA: Diagnosis not present

## 2014-11-24 DIAGNOSIS — N2581 Secondary hyperparathyroidism of renal origin: Secondary | ICD-10-CM | POA: Diagnosis not present

## 2014-11-26 ENCOUNTER — Other Ambulatory Visit: Payer: Self-pay | Admitting: Internal Medicine

## 2014-11-26 DIAGNOSIS — E876 Hypokalemia: Secondary | ICD-10-CM | POA: Diagnosis not present

## 2014-11-26 DIAGNOSIS — N2581 Secondary hyperparathyroidism of renal origin: Secondary | ICD-10-CM | POA: Diagnosis not present

## 2014-11-26 DIAGNOSIS — N186 End stage renal disease: Secondary | ICD-10-CM | POA: Diagnosis not present

## 2014-11-30 DIAGNOSIS — N2581 Secondary hyperparathyroidism of renal origin: Secondary | ICD-10-CM | POA: Diagnosis not present

## 2014-11-30 DIAGNOSIS — N186 End stage renal disease: Secondary | ICD-10-CM | POA: Diagnosis not present

## 2014-11-30 DIAGNOSIS — E8779 Other fluid overload: Secondary | ICD-10-CM | POA: Diagnosis not present

## 2014-12-01 DIAGNOSIS — N186 End stage renal disease: Secondary | ICD-10-CM | POA: Diagnosis not present

## 2014-12-01 DIAGNOSIS — E8779 Other fluid overload: Secondary | ICD-10-CM | POA: Diagnosis not present

## 2014-12-01 DIAGNOSIS — N2581 Secondary hyperparathyroidism of renal origin: Secondary | ICD-10-CM | POA: Diagnosis not present

## 2014-12-03 DIAGNOSIS — E8779 Other fluid overload: Secondary | ICD-10-CM | POA: Diagnosis not present

## 2014-12-03 DIAGNOSIS — N186 End stage renal disease: Secondary | ICD-10-CM | POA: Diagnosis not present

## 2014-12-03 DIAGNOSIS — N2581 Secondary hyperparathyroidism of renal origin: Secondary | ICD-10-CM | POA: Diagnosis not present

## 2014-12-06 DIAGNOSIS — N2581 Secondary hyperparathyroidism of renal origin: Secondary | ICD-10-CM | POA: Diagnosis not present

## 2014-12-06 DIAGNOSIS — N186 End stage renal disease: Secondary | ICD-10-CM | POA: Diagnosis not present

## 2014-12-06 DIAGNOSIS — E8779 Other fluid overload: Secondary | ICD-10-CM | POA: Diagnosis not present

## 2014-12-08 DIAGNOSIS — N186 End stage renal disease: Secondary | ICD-10-CM | POA: Diagnosis not present

## 2014-12-08 DIAGNOSIS — E8779 Other fluid overload: Secondary | ICD-10-CM | POA: Diagnosis not present

## 2014-12-08 DIAGNOSIS — N2581 Secondary hyperparathyroidism of renal origin: Secondary | ICD-10-CM | POA: Diagnosis not present

## 2014-12-09 DIAGNOSIS — N186 End stage renal disease: Secondary | ICD-10-CM | POA: Diagnosis not present

## 2014-12-09 DIAGNOSIS — E8779 Other fluid overload: Secondary | ICD-10-CM | POA: Diagnosis not present

## 2014-12-09 DIAGNOSIS — N2581 Secondary hyperparathyroidism of renal origin: Secondary | ICD-10-CM | POA: Diagnosis not present

## 2014-12-10 DIAGNOSIS — E8779 Other fluid overload: Secondary | ICD-10-CM | POA: Diagnosis not present

## 2014-12-10 DIAGNOSIS — N2581 Secondary hyperparathyroidism of renal origin: Secondary | ICD-10-CM | POA: Diagnosis not present

## 2014-12-10 DIAGNOSIS — N186 End stage renal disease: Secondary | ICD-10-CM | POA: Diagnosis not present

## 2014-12-13 DIAGNOSIS — N2581 Secondary hyperparathyroidism of renal origin: Secondary | ICD-10-CM | POA: Diagnosis not present

## 2014-12-13 DIAGNOSIS — E8779 Other fluid overload: Secondary | ICD-10-CM | POA: Diagnosis not present

## 2014-12-13 DIAGNOSIS — I120 Hypertensive chronic kidney disease with stage 5 chronic kidney disease or end stage renal disease: Secondary | ICD-10-CM | POA: Diagnosis not present

## 2014-12-13 DIAGNOSIS — N186 End stage renal disease: Secondary | ICD-10-CM | POA: Diagnosis not present

## 2014-12-13 DIAGNOSIS — Z992 Dependence on renal dialysis: Secondary | ICD-10-CM | POA: Diagnosis not present

## 2014-12-15 DIAGNOSIS — N186 End stage renal disease: Secondary | ICD-10-CM | POA: Diagnosis not present

## 2014-12-15 DIAGNOSIS — N2581 Secondary hyperparathyroidism of renal origin: Secondary | ICD-10-CM | POA: Diagnosis not present

## 2014-12-17 DIAGNOSIS — N186 End stage renal disease: Secondary | ICD-10-CM | POA: Diagnosis not present

## 2014-12-17 DIAGNOSIS — N2581 Secondary hyperparathyroidism of renal origin: Secondary | ICD-10-CM | POA: Diagnosis not present

## 2014-12-20 DIAGNOSIS — E1129 Type 2 diabetes mellitus with other diabetic kidney complication: Secondary | ICD-10-CM | POA: Diagnosis not present

## 2014-12-20 DIAGNOSIS — D631 Anemia in chronic kidney disease: Secondary | ICD-10-CM | POA: Diagnosis not present

## 2014-12-20 DIAGNOSIS — D509 Iron deficiency anemia, unspecified: Secondary | ICD-10-CM | POA: Diagnosis not present

## 2014-12-20 DIAGNOSIS — N186 End stage renal disease: Secondary | ICD-10-CM | POA: Diagnosis not present

## 2014-12-20 DIAGNOSIS — N2581 Secondary hyperparathyroidism of renal origin: Secondary | ICD-10-CM | POA: Diagnosis not present

## 2014-12-20 DIAGNOSIS — E876 Hypokalemia: Secondary | ICD-10-CM | POA: Diagnosis not present

## 2014-12-22 DIAGNOSIS — D509 Iron deficiency anemia, unspecified: Secondary | ICD-10-CM | POA: Diagnosis not present

## 2014-12-22 DIAGNOSIS — E1129 Type 2 diabetes mellitus with other diabetic kidney complication: Secondary | ICD-10-CM | POA: Diagnosis not present

## 2014-12-22 DIAGNOSIS — N186 End stage renal disease: Secondary | ICD-10-CM | POA: Diagnosis not present

## 2014-12-22 DIAGNOSIS — E876 Hypokalemia: Secondary | ICD-10-CM | POA: Diagnosis not present

## 2014-12-22 DIAGNOSIS — N2581 Secondary hyperparathyroidism of renal origin: Secondary | ICD-10-CM | POA: Diagnosis not present

## 2014-12-22 DIAGNOSIS — D631 Anemia in chronic kidney disease: Secondary | ICD-10-CM | POA: Diagnosis not present

## 2014-12-24 DIAGNOSIS — D631 Anemia in chronic kidney disease: Secondary | ICD-10-CM | POA: Diagnosis not present

## 2014-12-24 DIAGNOSIS — N2581 Secondary hyperparathyroidism of renal origin: Secondary | ICD-10-CM | POA: Diagnosis not present

## 2014-12-24 DIAGNOSIS — E1129 Type 2 diabetes mellitus with other diabetic kidney complication: Secondary | ICD-10-CM | POA: Diagnosis not present

## 2014-12-24 DIAGNOSIS — N186 End stage renal disease: Secondary | ICD-10-CM | POA: Diagnosis not present

## 2014-12-24 DIAGNOSIS — D509 Iron deficiency anemia, unspecified: Secondary | ICD-10-CM | POA: Diagnosis not present

## 2014-12-24 DIAGNOSIS — E876 Hypokalemia: Secondary | ICD-10-CM | POA: Diagnosis not present

## 2014-12-27 DIAGNOSIS — E876 Hypokalemia: Secondary | ICD-10-CM | POA: Diagnosis not present

## 2014-12-27 DIAGNOSIS — N186 End stage renal disease: Secondary | ICD-10-CM | POA: Diagnosis not present

## 2014-12-27 DIAGNOSIS — D631 Anemia in chronic kidney disease: Secondary | ICD-10-CM | POA: Diagnosis not present

## 2014-12-27 DIAGNOSIS — N2581 Secondary hyperparathyroidism of renal origin: Secondary | ICD-10-CM | POA: Diagnosis not present

## 2014-12-27 DIAGNOSIS — D509 Iron deficiency anemia, unspecified: Secondary | ICD-10-CM | POA: Diagnosis not present

## 2014-12-27 DIAGNOSIS — E1129 Type 2 diabetes mellitus with other diabetic kidney complication: Secondary | ICD-10-CM | POA: Diagnosis not present

## 2014-12-29 ENCOUNTER — Ambulatory Visit: Payer: Medicare Other | Admitting: Internal Medicine

## 2014-12-29 DIAGNOSIS — N2581 Secondary hyperparathyroidism of renal origin: Secondary | ICD-10-CM | POA: Diagnosis not present

## 2014-12-29 DIAGNOSIS — E1129 Type 2 diabetes mellitus with other diabetic kidney complication: Secondary | ICD-10-CM | POA: Diagnosis not present

## 2014-12-29 DIAGNOSIS — E876 Hypokalemia: Secondary | ICD-10-CM | POA: Diagnosis not present

## 2014-12-29 DIAGNOSIS — D631 Anemia in chronic kidney disease: Secondary | ICD-10-CM | POA: Diagnosis not present

## 2014-12-29 DIAGNOSIS — N186 End stage renal disease: Secondary | ICD-10-CM | POA: Diagnosis not present

## 2014-12-29 DIAGNOSIS — D509 Iron deficiency anemia, unspecified: Secondary | ICD-10-CM | POA: Diagnosis not present

## 2014-12-31 DIAGNOSIS — D631 Anemia in chronic kidney disease: Secondary | ICD-10-CM | POA: Diagnosis not present

## 2014-12-31 DIAGNOSIS — D509 Iron deficiency anemia, unspecified: Secondary | ICD-10-CM | POA: Diagnosis not present

## 2014-12-31 DIAGNOSIS — E876 Hypokalemia: Secondary | ICD-10-CM | POA: Diagnosis not present

## 2014-12-31 DIAGNOSIS — N186 End stage renal disease: Secondary | ICD-10-CM | POA: Diagnosis not present

## 2014-12-31 DIAGNOSIS — E1129 Type 2 diabetes mellitus with other diabetic kidney complication: Secondary | ICD-10-CM | POA: Diagnosis not present

## 2014-12-31 DIAGNOSIS — N2581 Secondary hyperparathyroidism of renal origin: Secondary | ICD-10-CM | POA: Diagnosis not present

## 2015-01-03 ENCOUNTER — Inpatient Hospital Stay (HOSPITAL_COMMUNITY)
Admission: EM | Admit: 2015-01-03 | Discharge: 2015-01-08 | DRG: 640 | Disposition: A | Discharge status: Home / Self Care | Payer: Medicare Other | Attending: Internal Medicine | Admitting: Internal Medicine

## 2015-01-03 ENCOUNTER — Emergency Department (HOSPITAL_COMMUNITY): Payer: Medicare Other

## 2015-01-03 DIAGNOSIS — Z9111 Patient's noncompliance with dietary regimen: Secondary | ICD-10-CM

## 2015-01-03 DIAGNOSIS — E785 Hyperlipidemia, unspecified: Secondary | ICD-10-CM | POA: Diagnosis not present

## 2015-01-03 DIAGNOSIS — Z4682 Encounter for fitting and adjustment of non-vascular catheter: Secondary | ICD-10-CM | POA: Diagnosis not present

## 2015-01-03 DIAGNOSIS — E875 Hyperkalemia: Secondary | ICD-10-CM | POA: Diagnosis not present

## 2015-01-03 DIAGNOSIS — I272 Other secondary pulmonary hypertension: Secondary | ICD-10-CM | POA: Diagnosis present

## 2015-01-03 DIAGNOSIS — Z9119 Patient's noncompliance with other medical treatment and regimen: Secondary | ICD-10-CM | POA: Diagnosis not present

## 2015-01-03 DIAGNOSIS — N186 End stage renal disease: Secondary | ICD-10-CM | POA: Diagnosis present

## 2015-01-03 DIAGNOSIS — Z8249 Family history of ischemic heart disease and other diseases of the circulatory system: Secondary | ICD-10-CM | POA: Diagnosis not present

## 2015-01-03 DIAGNOSIS — J9601 Acute respiratory failure with hypoxia: Secondary | ICD-10-CM | POA: Diagnosis not present

## 2015-01-03 DIAGNOSIS — I468 Cardiac arrest due to other underlying condition: Secondary | ICD-10-CM | POA: Diagnosis present

## 2015-01-03 DIAGNOSIS — G4733 Obstructive sleep apnea (adult) (pediatric): Secondary | ICD-10-CM | POA: Diagnosis present

## 2015-01-03 DIAGNOSIS — E871 Hypo-osmolality and hyponatremia: Secondary | ICD-10-CM | POA: Diagnosis present

## 2015-01-03 DIAGNOSIS — I252 Old myocardial infarction: Secondary | ICD-10-CM | POA: Diagnosis not present

## 2015-01-03 DIAGNOSIS — Z7984 Long term (current) use of oral hypoglycemic drugs: Secondary | ICD-10-CM | POA: Diagnosis not present

## 2015-01-03 DIAGNOSIS — N250 Renal osteodystrophy: Secondary | ICD-10-CM | POA: Diagnosis present

## 2015-01-03 DIAGNOSIS — R079 Chest pain, unspecified: Secondary | ICD-10-CM | POA: Insufficient documentation

## 2015-01-03 DIAGNOSIS — E11319 Type 2 diabetes mellitus with unspecified diabetic retinopathy without macular edema: Secondary | ICD-10-CM | POA: Diagnosis present

## 2015-01-03 DIAGNOSIS — R4182 Altered mental status, unspecified: Secondary | ICD-10-CM | POA: Diagnosis not present

## 2015-01-03 DIAGNOSIS — I480 Paroxysmal atrial fibrillation: Secondary | ICD-10-CM | POA: Diagnosis not present

## 2015-01-03 DIAGNOSIS — I10 Essential (primary) hypertension: Secondary | ICD-10-CM | POA: Diagnosis present

## 2015-01-03 DIAGNOSIS — I469 Cardiac arrest, cause unspecified: Secondary | ICD-10-CM | POA: Diagnosis present

## 2015-01-03 DIAGNOSIS — Z9115 Patient's noncompliance with renal dialysis: Secondary | ICD-10-CM | POA: Diagnosis not present

## 2015-01-03 DIAGNOSIS — I5043 Acute on chronic combined systolic (congestive) and diastolic (congestive) heart failure: Secondary | ICD-10-CM | POA: Diagnosis not present

## 2015-01-03 DIAGNOSIS — E1122 Type 2 diabetes mellitus with diabetic chronic kidney disease: Secondary | ICD-10-CM | POA: Diagnosis present

## 2015-01-03 DIAGNOSIS — M1990 Unspecified osteoarthritis, unspecified site: Secondary | ICD-10-CM | POA: Diagnosis present

## 2015-01-03 DIAGNOSIS — I2510 Atherosclerotic heart disease of native coronary artery without angina pectoris: Secondary | ICD-10-CM | POA: Diagnosis present

## 2015-01-03 DIAGNOSIS — E1140 Type 2 diabetes mellitus with diabetic neuropathy, unspecified: Secondary | ICD-10-CM | POA: Diagnosis present

## 2015-01-03 DIAGNOSIS — Z7901 Long term (current) use of anticoagulants: Secondary | ICD-10-CM

## 2015-01-03 DIAGNOSIS — Z8674 Personal history of sudden cardiac arrest: Secondary | ICD-10-CM | POA: Diagnosis not present

## 2015-01-03 DIAGNOSIS — M10071 Idiopathic gout, right ankle and foot: Secondary | ICD-10-CM | POA: Diagnosis present

## 2015-01-03 DIAGNOSIS — Z992 Dependence on renal dialysis: Secondary | ICD-10-CM

## 2015-01-03 DIAGNOSIS — Z6836 Body mass index (BMI) 36.0-36.9, adult: Secondary | ICD-10-CM | POA: Diagnosis not present

## 2015-01-03 DIAGNOSIS — I959 Hypotension, unspecified: Secondary | ICD-10-CM | POA: Diagnosis not present

## 2015-01-03 DIAGNOSIS — R404 Transient alteration of awareness: Secondary | ICD-10-CM | POA: Diagnosis not present

## 2015-01-03 DIAGNOSIS — G9341 Metabolic encephalopathy: Secondary | ICD-10-CM | POA: Diagnosis present

## 2015-01-03 DIAGNOSIS — Z96641 Presence of right artificial hip joint: Secondary | ICD-10-CM | POA: Diagnosis present

## 2015-01-03 DIAGNOSIS — K219 Gastro-esophageal reflux disease without esophagitis: Secondary | ICD-10-CM | POA: Diagnosis present

## 2015-01-03 DIAGNOSIS — J811 Chronic pulmonary edema: Secondary | ICD-10-CM | POA: Diagnosis not present

## 2015-01-03 DIAGNOSIS — D631 Anemia in chronic kidney disease: Secondary | ICD-10-CM | POA: Diagnosis present

## 2015-01-03 DIAGNOSIS — R531 Weakness: Secondary | ICD-10-CM | POA: Diagnosis not present

## 2015-01-03 DIAGNOSIS — L97211 Non-pressure chronic ulcer of right calf limited to breakdown of skin: Secondary | ICD-10-CM | POA: Diagnosis not present

## 2015-01-03 DIAGNOSIS — I2584 Coronary atherosclerosis due to calcified coronary lesion: Secondary | ICD-10-CM | POA: Diagnosis present

## 2015-01-03 DIAGNOSIS — J9690 Respiratory failure, unspecified, unspecified whether with hypoxia or hypercapnia: Secondary | ICD-10-CM | POA: Diagnosis not present

## 2015-01-03 DIAGNOSIS — I132 Hypertensive heart and chronic kidney disease with heart failure and with stage 5 chronic kidney disease, or end stage renal disease: Secondary | ICD-10-CM | POA: Diagnosis present

## 2015-01-03 DIAGNOSIS — R918 Other nonspecific abnormal finding of lung field: Secondary | ICD-10-CM | POA: Diagnosis not present

## 2015-01-03 DIAGNOSIS — Z8673 Personal history of transient ischemic attack (TIA), and cerebral infarction without residual deficits: Secondary | ICD-10-CM

## 2015-01-03 DIAGNOSIS — E1340 Other specified diabetes mellitus with diabetic neuropathy, unspecified: Secondary | ICD-10-CM | POA: Diagnosis not present

## 2015-01-03 DIAGNOSIS — R001 Bradycardia, unspecified: Secondary | ICD-10-CM | POA: Diagnosis not present

## 2015-01-03 HISTORY — DX: Hypothyroidism, unspecified: E03.9

## 2015-01-03 LAB — COMPREHENSIVE METABOLIC PANEL
ALK PHOS: 161 U/L — AB (ref 38–126)
ALT: 56 U/L (ref 17–63)
ANION GAP: 17 — AB (ref 5–15)
AST: 51 U/L — ABNORMAL HIGH (ref 15–41)
Albumin: 2.9 g/dL — ABNORMAL LOW (ref 3.5–5.0)
BUN: 91 mg/dL — ABNORMAL HIGH (ref 6–20)
CALCIUM: 8.8 mg/dL — AB (ref 8.9–10.3)
CO2: 19 mmol/L — ABNORMAL LOW (ref 22–32)
CREATININE: 11.1 mg/dL — AB (ref 0.61–1.24)
Chloride: 94 mmol/L — ABNORMAL LOW (ref 101–111)
GFR calc non Af Amer: 4 mL/min — ABNORMAL LOW (ref >60)
GFR, EST AFRICAN AMERICAN: 5 mL/min — AB (ref >60)
Glucose, Bld: 150 mg/dL — ABNORMAL HIGH (ref 65–99)
Potassium: 7 mmol/L (ref 3.5–5.1)
Sodium: 130 mmol/L — ABNORMAL LOW (ref 135–145)
TOTAL PROTEIN: 6.5 g/dL (ref 6.5–8.1)
Total Bilirubin: 0.7 mg/dL (ref 0.3–1.2)

## 2015-01-03 LAB — I-STAT ARTERIAL BLOOD GAS, ED
Acid-base deficit: 5 mmol/L — ABNORMAL HIGH (ref 0.0–2.0)
Bicarbonate: 21.7 mEq/L (ref 20.0–24.0)
O2 Saturation: 100 %
PCO2 ART: 47.8 mmHg — AB (ref 35.0–45.0)
PH ART: 7.266 — AB (ref 7.350–7.450)
TCO2: 23 mmol/L (ref 0–100)
pO2, Arterial: 338 mmHg — ABNORMAL HIGH (ref 80.0–100.0)

## 2015-01-03 LAB — URINE MICROSCOPIC-ADD ON

## 2015-01-03 LAB — URINALYSIS, ROUTINE W REFLEX MICROSCOPIC
GLUCOSE, UA: NEGATIVE mg/dL
Ketones, ur: NEGATIVE mg/dL
Nitrite: NEGATIVE
PROTEIN: 30 mg/dL — AB
Specific Gravity, Urine: 1.018 (ref 1.005–1.030)
pH: 5 (ref 5.0–8.0)

## 2015-01-03 LAB — CBC WITH DIFFERENTIAL/PLATELET
Basophils Absolute: 0 10*3/uL (ref 0.0–0.1)
Basophils Relative: 0 %
Eosinophils Absolute: 0.1 10*3/uL (ref 0.0–0.7)
Eosinophils Relative: 1 %
HEMATOCRIT: 33.8 % — AB (ref 39.0–52.0)
HEMOGLOBIN: 10.8 g/dL — AB (ref 13.0–17.0)
LYMPHS ABS: 1.4 10*3/uL (ref 0.7–4.0)
LYMPHS PCT: 14 %
MCH: 31.1 pg (ref 26.0–34.0)
MCHC: 32 g/dL (ref 30.0–36.0)
MCV: 97.4 fL (ref 78.0–100.0)
MONOS PCT: 5 %
Monocytes Absolute: 0.5 10*3/uL (ref 0.1–1.0)
NEUTROS ABS: 7.7 10*3/uL (ref 1.7–7.7)
NEUTROS PCT: 80 %
Platelets: 210 10*3/uL (ref 150–400)
RBC: 3.47 MIL/uL — AB (ref 4.22–5.81)
RDW: 16.7 % — ABNORMAL HIGH (ref 11.5–15.5)
WBC: 9.7 10*3/uL (ref 4.0–10.5)

## 2015-01-03 LAB — I-STAT CHEM 8, ED
BUN: 96 mg/dL — AB (ref 6–20)
CHLORIDE: 97 mmol/L — AB (ref 101–111)
Calcium, Ion: 1.04 mmol/L — ABNORMAL LOW (ref 1.13–1.30)
Creatinine, Ser: 10.7 mg/dL — ABNORMAL HIGH (ref 0.61–1.24)
GLUCOSE: 150 mg/dL — AB (ref 65–99)
HCT: 35 % — ABNORMAL LOW (ref 39.0–52.0)
HEMOGLOBIN: 11.9 g/dL — AB (ref 13.0–17.0)
POTASSIUM: 6.4 mmol/L — AB (ref 3.5–5.1)
Sodium: 128 mmol/L — ABNORMAL LOW (ref 135–145)
TCO2: 21 mmol/L (ref 0–100)

## 2015-01-03 LAB — CBG MONITORING, ED
GLUCOSE-CAPILLARY: 128 mg/dL — AB (ref 65–99)
Glucose-Capillary: 110 mg/dL — ABNORMAL HIGH (ref 65–99)

## 2015-01-03 LAB — TROPONIN I: Troponin I: <0.03 ng/mL (ref <0.031)

## 2015-01-03 LAB — PROTIME-INR
INR: 3.31 — AB (ref 0.00–1.49)
PROTHROMBIN TIME: 33 s — AB (ref 11.6–15.2)

## 2015-01-03 LAB — I-STAT TROPONIN, ED: Troponin i, poc: 0 ng/mL (ref 0.00–0.08)

## 2015-01-03 LAB — LACTIC ACID, PLASMA
LACTIC ACID, VENOUS: 2.8 mmol/L — AB (ref 0.5–2.0)
LACTIC ACID, VENOUS: 1.2 mmol/L (ref 0.5–2.0)

## 2015-01-03 LAB — TRIGLYCERIDES: TRIGLYCERIDES: 126 mg/dL (ref <150)

## 2015-01-03 LAB — GLUCOSE, CAPILLARY: Glucose-Capillary: 111 mg/dL — ABNORMAL HIGH (ref 65–99)

## 2015-01-03 MED ORDER — SODIUM CHLORIDE 0.9 % IV SOLN: 250.0000 mL | INTRAVENOUS | Status: DC | PRN

## 2015-01-03 MED ORDER — FENTANYL CITRATE (PF) 100 MCG/2ML IJ SOLN
100.0000 ug | Freq: Once | INTRAMUSCULAR | Status: DC
  Filled 2015-01-03: qty 2

## 2015-01-03 MED ORDER — FENTANYL BOLUS VIA INFUSION
50.0000 ug | INTRAVENOUS | Status: DC | PRN
  Filled 2015-01-03: qty 50

## 2015-01-03 MED ORDER — PANTOPRAZOLE SODIUM 40 MG IV SOLR
40.0000 mg | Freq: Every day | INTRAVENOUS | Status: DC
  Administered 2015-01-03: 40 mg via INTRAVENOUS
  Filled 2015-01-03 (×2): qty 40

## 2015-01-03 MED ORDER — HEPARIN SODIUM (PORCINE) 5000 UNIT/ML IJ SOLN: 5000.0000 [IU] | Freq: Three times a day (TID) | INTRAMUSCULAR | Status: DC

## 2015-01-03 MED ORDER — FENTANYL CITRATE (PF) 100 MCG/2ML IJ SOLN: 50.0000 ug | Freq: Once | INTRAMUSCULAR | Status: DC

## 2015-01-03 MED ORDER — DOXERCALCIFEROL 4 MCG/2ML IV SOLN
5.0000 ug | INTRAVENOUS | Status: DC
  Administered 2015-01-04: 5 ug via INTRAVENOUS
  Filled 2015-01-03 (×3): qty 4

## 2015-01-03 MED ORDER — FENTANYL CITRATE (PF) 100 MCG/2ML IJ SOLN
50.0000 ug | Freq: Once | INTRAMUSCULAR | Status: AC
  Administered 2015-01-03: 50 ug via INTRAVENOUS

## 2015-01-03 MED ORDER — FENTANYL CITRATE (PF) 100 MCG/2ML IJ SOLN
INTRAMUSCULAR | Status: AC | PRN
  Administered 2015-01-03: 100 ug via INTRAVENOUS

## 2015-01-03 MED ORDER — PROPOFOL 1000 MG/100ML IV EMUL
5.0000 ug/kg/min | INTRAVENOUS | Status: DC
  Administered 2015-01-03: 5 ug/kg/min via INTRAVENOUS
  Filled 2015-01-03 (×2): qty 100

## 2015-01-03 MED ORDER — EPINEPHRINE HCL 0.1 MG/ML IJ SOSY
PREFILLED_SYRINGE | INTRAMUSCULAR | Status: AC | PRN
  Administered 2015-01-03 (×2): 1 mg via INTRAVENOUS

## 2015-01-03 MED ORDER — SIMVASTATIN 20 MG PO TABS
20.0000 mg | ORAL_TABLET | Freq: Every day | ORAL | Status: DC
  Administered 2015-01-04: 20 mg
  Filled 2015-01-03 (×2): qty 1

## 2015-01-03 MED ORDER — INSULIN ASPART 100 UNIT/ML ~~LOC~~ SOLN
0.0000 [IU] | SUBCUTANEOUS | Status: DC
Start: 2015-01-03 — End: 2015-01-04

## 2015-01-03 MED ORDER — SODIUM CHLORIDE 0.9 % IV SOLN
1.0000 g | Freq: Once | INTRAVENOUS | Status: AC
  Administered 2015-01-03: 1 g via INTRAVENOUS
  Filled 2015-01-03: qty 10

## 2015-01-03 MED ORDER — PROPOFOL BOLUS VIA INFUSION
1.0000 mg/kg | Freq: Once | INTRAVENOUS | Status: DC
  Filled 2015-01-03: qty 112

## 2015-01-03 MED ORDER — SODIUM CHLORIDE 0.9 % IV BOLUS (SEPSIS)
1000.0000 mL | Freq: Once | INTRAVENOUS | Status: AC
  Administered 2015-01-03: 1000 mL via INTRAVENOUS

## 2015-01-03 MED ORDER — FENTANYL CITRATE (PF) 100 MCG/2ML IJ SOLN
INTRAMUSCULAR | Status: AC
  Filled 2015-01-03: qty 2

## 2015-01-03 MED ORDER — SODIUM CHLORIDE 0.9 % IV SOLN
25.0000 ug/h | INTRAVENOUS | Status: DC
  Administered 2015-01-04: 25 ug/h via INTRAVENOUS
  Filled 2015-01-03: qty 50

## 2015-01-03 MED ORDER — FENTANYL CITRATE (PF) 100 MCG/2ML IJ SOLN
100.0000 ug | Freq: Once | INTRAMUSCULAR | Status: AC
  Administered 2015-01-03: 100 ug via INTRAVENOUS
  Filled 2015-01-03: qty 2

## 2015-01-03 MED ORDER — ATROPINE SULFATE 0.1 MG/ML IJ SOLN
0.5000 mg | Freq: Once | INTRAMUSCULAR | Status: AC
  Administered 2015-01-03: 0.5 mg via INTRAVENOUS

## 2015-01-03 MED ORDER — MIDAZOLAM HCL 2 MG/2ML IJ SOLN: 2.0000 mg | INTRAMUSCULAR | Status: DC | PRN

## 2015-01-03 MED ORDER — ALBUTEROL (5 MG/ML) CONTINUOUS INHALATION SOLN: 10.0000 mg/h | INHALATION_SOLUTION | RESPIRATORY_TRACT | Status: DC

## 2015-01-03 MED ORDER — SODIUM CHLORIDE 0.9 % IV SOLN
INTRAVENOUS | Status: DC
  Administered 2015-01-03: via INTRAVENOUS

## 2015-01-03 MED ORDER — CALCIUM CHLORIDE 10 % IV SOLN
INTRAVENOUS | Status: AC | PRN
  Administered 2015-01-03 (×3): 1 g via INTRAVENOUS

## 2015-01-03 MED ORDER — SODIUM BICARBONATE 8.4 % IV SOLN
INTRAVENOUS | Status: AC | PRN
  Administered 2015-01-03 (×2): 100 meq via INTRAVENOUS

## 2015-01-03 MED ORDER — STERILE WATER FOR INJECTION IV SOLN
Freq: Once | INTRAVENOUS | Status: AC
  Administered 2015-01-03: 19:00:00 via INTRAVENOUS
  Filled 2015-01-03: qty 850

## 2015-01-03 MED ORDER — AMIODARONE HCL 200 MG PO TABS
200.0000 mg | ORAL_TABLET | Freq: Every day | ORAL | Status: DC
  Administered 2015-01-05 – 2015-01-08 (×4): 200 mg
  Filled 2015-01-03 (×5): qty 1

## 2015-01-03 MED ORDER — NOREPINEPHRINE BITARTRATE 1 MG/ML IV SOLN
5.0000 ug/min | INTRAVENOUS | Status: DC
  Filled 2015-01-03: qty 4

## 2015-01-03 NOTE — ED Notes (Signed)
Pt O2 sats dipping into 80's with good wave form. Respiratory notified at current time. Xray notified of need for placement verification of lines and airway.

## 2015-01-03 NOTE — Code Documentation (Signed)
Pt to ER by [facility] from home, called out as a fall - when EMS arrived pt was in the floor on his left side, completely alert and oriented at that time just complaining of weakness. Pt is dialysis pt, did not receive dialysis, EMS unaware of last dialysis. Pt was applied to cardiac monitor by EMS and noted to have very wide complex BBB, pt became very pale and diaphoretic and short of breath, heart rate was in the 40's EMS noted pt to become very lethargic and unresponsive, pacing was initiated. On arrival to ER MD Messner in to assess the patient and pt noted to have no pulse, CPR was initiated at that time.

## 2015-01-03 NOTE — Consult Note (Addendum)
[facility] Consultation Note Requesting Physician:  EDP Reason for Consult:  Acute dialysis for life threatening hyperkalemia in ESRD patient HPI: The patient is a 59 y.o. year-old male with CAD, AF, DM2, pulm HTN, OSA, [name] STATUS. ESRD on HD at [facility].  Last treatment prior to today was on Friday, got full treatment, ran BFR of 450, and got to weight of 109.9 kg with EDW of 108 kg.   No one with patient so events of weekend not known, but EMS was called to his home this afternoon for severe weakness, and he was lying in the floor but conversant at time of their arrival;. Noted to have a wide complex on ECG, then became pale, diaphoretic, SOB, HR dropped to the 40's, pt became unresponsive and pacing was initiated.  CPR initiated. PEA arrest lasted approx 10 minutes per EDP  In the ED he received sodium bicarb, glucose, insulin, calcium and potassium AFTER these interventions was 6.4 with ABG showing pH of 7.26 bicarb of 23.  Rhythm was restored, HR in the 40-50's, intubated and sedated, and we were called to see.    As noted, he did receive a full HD treatment on Friday the 18th, and appears his AVF worked fine, last access flow was stable (12/2014). Last outpt K was 5.7 on 11/16.  On review of the chart he has had prior admissions for hyperkalemia (had asystolic arrest 04/8248 after missing HD X2 - K at that time only 5.4 -  but admitted 10/2014 with profound leg weakness, K of 7.6, wide complex - and he admitted at that time to dietary indiscretion). Since intubated and sedated cannot question him about weekend dietary issues but suspect that is the issue here.      Past Medical History  Diagnosis Date  . Glaucoma   . Hypertension   . Hyperlipidemia   . Atrial fibrillation 12/30/2012  . CHF (congestive heart failure) 12/30/2012  . Diabetic retinopathy 12/30/2012  . Morbid obesity 12/30/2012  . Pulmonary hypertension 12/30/2012  . Myocardial infarction ([place])    "I've had ~ 3; last one was in ~ 10/2013" (01/30/2014)  . Diabetes mellitus with neuropathy   . OSA (obstructive sleep apnea) 12/30/2012    "lost weight; no longer needed CPAP; retested said I needed it; didn't followup cause I was feeling fine" (01/30/2014)  . GERD (gastroesophageal reflux disease)   . Stroke ~ 2005; ~ 2005    "they were mild; I didn't even notice I'd had them"; denies residual on 01/30/2014  . History of gout     "right big toe"  . Arthritis     "all over"  . Chronic lower back pain   . ESRD (end stage renal disease) on dialysis started in 2013    "MWF; [facility]; [address]" (01/30/2014)  . Refusal of blood transfusions as patient is Jehovah's Witness   . Anemia due to other cause 07/07/2014  . Allergic rhinitis 07/07/2014     Past Surgical History  Procedure Laterality Date  . Total hip arthroplasty Right 1980  . Av fistula placement Left ~ 10/2013    "forearm"  . Closed reduction hip dislocation Right 1970's     Family History  Problem Relation Age of Onset  . Arthritis Other   . Heart disease Other   . Hyperlipidemia Other   . Stroke Other   . Hypertension Other   . Kidney disease Other   . Diabetes Other    Social History:  reports  that he has never smoked. He has never used smokeless tobacco. He reports that he drinks about 1.5 - 2.0 oz of alcohol per week. He reports that he does not use illicit drugs.Currently unable to obtain social history  Allergies: No Known Allergies  Home medications: Prior to Admission medications   Medication Sig Start Date End Date Taking? Authorizing Provider  acetaminophen (TYLENOL) 650 MG CR tablet Take 650 mg by mouth every 8 (eight) hours as needed for pain.    Historical Provider, MD  allopurinol (ZYLOPRIM) 100 MG tablet TAKE ONE TABLET BY MOUTH TWICE DAILY 06/28/14   Lyndal Pulley, DO  amiodarone (PACERONE) 200 MG tablet Take 1 tablet (200 mg total) by mouth daily. 10/08/14   Biagio Borg, MD  amitriptyline  (ELAVIL) 50 MG tablet TAKE ONE TABLET BY MOUTH ONCE DAILY AT BEDTIME 08/06/14   Biagio Borg, MD  Blood Glucose Monitoring Suppl (ONE TOUCH ULTRA 2) W/DEVICE KIT Use to check blood sugars twice a day Dx E11.9 10/29/14   Biagio Borg, MD  carvedilol (COREG) 25 MG tablet Take 25 mg by mouth 2 (two) times daily with a meal.    Historical Provider, MD  docusate sodium (COLACE) 100 MG capsule Take 100 mg by mouth daily.    Historical Provider, MD  glucose blood (ONE TOUCH ULTRA TEST) test strip 1 each by Other route 2 (two) times daily. Use to check blood sugars twice a day Dx E11.9 10/29/14   Biagio Borg, MD  hydrOXYzine (ATARAX/VISTARIL) 25 MG tablet Take 1 tablet (25 mg total) by mouth 3 (three) times daily as needed for itching. 10/13/14   Biagio Borg, MD  JANUVIA 25 MG tablet TAKE ONE TABLET BY MOUTH ONCE DAILY 10/19/14   Biagio Borg, MD  Lancets [facility] ULTRASOFT) lancets 1 each by Other route 2 (two) times daily. Use to help check blood sugars twice a day Dx E11.9 10/29/14   Biagio Borg, MD  loperamide (IMODIUM A-D) 2 MG tablet Take 2 mg by mouth 4 (four) times daily as needed for diarrhea or loose stools.    Historical Provider, MD  ondansetron (ZOFRAN) 4 MG tablet Take 1 tablet (4 mg total) by mouth every 6 (six) hours. 08/31/14   Theodis Blaze, MD  ranitidine (ZANTAC) 150 MG tablet Take 150 mg by mouth daily as needed for heartburn.    Historical Provider, MD  sevelamer carbonate (RENVELA) 800 MG tablet Take 1,600 mg by mouth 3 (three) times daily with meals.     Historical Provider, MD  simvastatin (ZOCOR) 20 MG tablet TAKE ONE TABLET BY MOUTH ONCE DAILY 08/06/14   Biagio Borg, MD  sodium chloride (OCEAN) 0.65 % SOLN nasal spray Place 1 spray into both nostrils as needed for congestion. 01/31/14   Shanker Kristeen Mans, MD  tadalafil (CIALIS) 20 MG tablet Take 1 tablet (20 mg total) by mouth daily as needed for erectile dysfunction. 10/27/13   Biagio Borg, MD  warfarin (COUMADIN) 3 MG tablet Please  take as directed by anticoagulation clinic. 10/11/14   Biagio Borg, MD    Inpatient medications: . fentaNYL (SUBLIMAZE) injection  50 mcg Intravenous Once  . heparin  5,000 Units Subcutaneous 3 times per day  . pantoprazole (PROTONIX) IV  40 mg Intravenous QHS    Review of Systems Unobtainable - pt is intubated and sedated   Physical Exam:  BP 102/56 mmHg  Pulse 71  Temp(Src) 96.4 F (35.8 C)  Resp 10  Wt 112 kg (246 lb 14.6 oz)  SpO2 98%  Gen: Obese male, intubated, sedated VS as noted Skin: no rash, cyanosis Neck: Cannot see neck VV Chest: Grossly clear Heart: Heart rate in the 40-50's. Afib with wide QRS Abdomen: soft, obese.  Cannot assess tenderness. Ext: Left AVF + bruit   Labs: Basic Metabolic Panel:  Recent Labs Lab 01/03/15 1927  NA 128*  K 6.4*  CL 97*  GLUCOSE 150*  BUN 96*  CREATININE 10.70*     Recent Labs Lab 01/03/15 1927 01/03/15 1958  WBC  --  9.7  NEUTROABS  --  7.7  HGB 11.9* 10.8*  HCT 35.0* 33.8*  MCV  --  97.4  PLT  --  210   Dialysis Prescription [facility] EDW 108 kg 4 hours BFR 450 DFR  A 1.5 2K 2Ca Left AVF Mircera 100 Q2weeks (last dosed 11/16) Hectorol 5 mcg  Background 59 y.o. year-old male with CAD, AF, DM2, pulm HTN, OSA, [name] STATUS. ESRD on HD at [facility].  Last treatment prior to today was on Friday, got full treatment, ran BFR of 450, and got to weight of 109.9 kg with EDW of 108 kg. No one with patient so events of weekend not known, but EMS was called to his home this afternoon for weakness, and he was lying in the floor but conversant. Noted to have a wide complex on ECG, then became pale, diaphoretic, SOB, HR dropped to the 40's, pt became unresponsive and pacing was initated.  CPR initiated. In the ED he received sodium bicarb, glucose, insulin, calcium and potassium AFTER these interventions was 6.4 with ABG showing pH of 7.26 bicarb of 23.  Rhythm was restored, HR in the 40-50's, intubated and  sedated, and we were called to see.  Has had prior admissions for hyperkalemia as result of dietary indiscretion (and has had prior cardiac arrests (X2 7-2/17) not necessarily related to elev K)  Impression/Plan 1. Life threatening hyperkalemia - emergency HD tonight in ICU. 0 K bath for 30 minutes, then 1K bath for remainder. K AFTER interventions 6.4 - suspect initial K much higher.   2. ESRD - MWF [place]. HD tonight for life threatening hyperK (last HD prior to today was on Friday full TMT) 3. Anemia - [name]. On outpt Mircera. Last dosed 11/16. 4. Secondary hyperpara - hectorol. Binders when able 5. DM - per primary service 6. VDRF - per CCM 7. Atrial fibrillation - presumably warfarin/amio PTA. Pharmacy dosing heparin.   Jamal Maes,  MD [facility] [phone] pager 01/03/2015, 8:22 PM

## 2015-01-03 NOTE — Progress Notes (Signed)
RT got called to A11 respond to CPR. Patient was manually ventilated and then intubated and placed on the vent. RT will continue to monitor.

## 2015-01-03 NOTE — Code Documentation (Signed)
MD Messner at bedside placing central line.

## 2015-01-03 NOTE — Code Documentation (Signed)
Pt awake at current time, looking around the room, attempting to fight the ventilator. MD Messner ordered 100 IV push fentanyl at current time.

## 2015-01-03 NOTE — Progress Notes (Addendum)
ANTICOAGULATION CONSULT NOTE - Initial Consult  Pharmacy Consult for heparin Indication: atrial fibrillation  No Known Allergies  Patient Measurements: Weight: 246 lb 14.6 oz (112 kg) Heparin Dosing Weight: 90kg  Vital Signs: Temp: 96.3 F (35.7 C) (11/21 2024) BP: 102/56 mmHg (11/21 2015) Pulse Rate: 43 (11/21 2024)  Labs:  Recent Labs  01/03/15 1927 01/03/15 1958  HGB 11.9* 10.8*  HCT 35.0* 33.8*  PLT  --  210  CREATININE 10.70*  --     Estimated Creatinine Clearance: 8.6 mL/min (by C-G formula based on Cr of 10.7).   Medical History: Past Medical History  Diagnosis Date  . Glaucoma   . Hypertension   . Hyperlipidemia   . Atrial fibrillation 12/30/2012  . CHF (congestive heart failure) 12/30/2012  . Diabetic retinopathy 12/30/2012  . Morbid obesity 12/30/2012  . Pulmonary hypertension 12/30/2012  . Myocardial infarction ([place])     "I've had ~ 3; last one was in ~ 10/2013" (01/30/2014)  . Diabetes mellitus with neuropathy   . OSA (obstructive sleep apnea) 12/30/2012    "lost weight; no longer needed CPAP; retested said I needed it; didn't followup cause I was feeling fine" (01/30/2014)  . GERD (gastroesophageal reflux disease)   . Stroke ~ 2005; ~ 2005    "they were mild; I didn't even notice I'd had them"; denies residual on 01/30/2014  . History of gout     "right big toe"  . Arthritis     "all over"  . Chronic lower back pain   . ESRD (end stage renal disease) on dialysis started in 2013    "MWF; [facility]; [address]" (01/30/2014)  . Refusal of blood transfusions as patient is Jehovah's Witness   . Anemia due to other cause 07/07/2014  . Allergic rhinitis 07/07/2014    Assessment: 2 yom with ESRD on HD to ED with fall, now s/p cardiac arrest in ED. Pharmacy consulted to dose heparin for afib (warfarin pta - unsure of last dose, no recent anticoag visits since 11/02/14 on [facility]). INR supratherapeutic 3.31 on admit. Hg 10.8 plt wnl. No bleed  documented.  Goal of Therapy:  Heparin level 0.3-0.7 units/ml Monitor platelets by anticoagulation protocol: Yes   Plan:  Heparin when INR<2 Daily INR/CBC Mon s/sx bleeding  Elicia Lamp, PharmD Clinical Pharmacist Pager [phone] 01/03/2015 8:29 PM

## 2015-01-03 NOTE — Procedures (Signed)
K at initiation of HD >7.5 0 K bath 1st 30 minutes, then change to 1K AVF no issues BFR 450. HR has already increased 40's->60's.   Jamal Maes, MD [facility] [phone] Pager 01/03/2015, 10:54 PM

## 2015-01-03 NOTE — Progress Notes (Signed)
Patient transferred to 2M from ED no complications. RT will continue to monitor.

## 2015-01-03 NOTE — ED Notes (Signed)
CBG: 128. md made aware

## 2015-01-03 NOTE — H&P (Signed)
PULMONARY / CRITICAL CARE MEDICINE   Name: Daniel Whitney MRN: [id number] DOB: 1955/03/03    ADMISSION DATE:  01/03/2015 CONSULTATION DATE:  01/04/15  REFERRING MD :  EDP  CHIEF COMPLAINT:  Cardiac Arrest  INITIAL PRESENTATION:  59 y.o. male who is  [facility] WITNESS, with hx ESRD (M/W/F).  He was scheduled for dialysis Mon 11/21; however, he felt bad so he rescheduled for Tues 11/22 (last session Fri 11/18).  He was brought to [facility] ED 11/21 after he collapsed at home.  In ED, found to have hyperkalemia and had 10 min PEA arrest during which he was intubated.  PCCM called for admission..    STUDIES:  CXR 11/21 >>>  SIGNIFICANT EVENTS: 11/21 - admitted after PEA arrest (K 6.4 > pt had missed his dialysis session on day of admit).   HISTORY OF PRESENT ILLNESS:  Pt is encephalopathic; therefore, this HPI is obtained from chart review. Henery Whitney is a 59 y.o. male with a PMH as outlined below including ESRD (M/W/F).  He was scheduled for dialysis Mon 11/21; however, he felt bad so he rescheduled for Tues 11/22 (last session Fri 11/18.  He was apparently at his home and roommate heard a loud noise.  When roommate went into the room pt was in, he found him on the floor lying on his left side.  EMS was dispatched and upon their arrival, pt was awake but only complaining of weakness.  While EMS was working on pt, he became very pale and diaphoretic.  HR dropped into the 40's therefore EMS began TC pacing.  On arrival to ED, pt was found to have no pulse.  Rhythm was PEA and CPR was initiated with ROSC in roughly 10 minutes.  Pt was intubated during CPR and following ROSC, he was reportedly awake looking around the room and fighting the ventilator.  Labs were noteable for potassium of 6.4.  PCCM was called to admit.  Of note, pt has had hx of missing dialysis in the past and in fact did have a subsequent cardiac arrest back in July 2016.   PAST MEDICAL HISTORY :   has a past medical history of  Glaucoma; Hypertension; Hyperlipidemia; Atrial fibrillation (12/30/2012); CHF (congestive heart failure) (12/30/2012); Diabetic retinopathy (12/30/2012); Morbid obesity (12/30/2012); Pulmonary hypertension (12/30/2012); Myocardial infarction ([place]); Diabetes mellitus with neuropathy; OSA (obstructive sleep apnea) (12/30/2012); GERD (gastroesophageal reflux disease); Stroke (~ 2005; ~ 2005); History of gout; Arthritis; Chronic lower back pain; ESRD (end stage renal disease) on dialysis (started in 2013); Refusal of blood transfusions as patient is Jehovah's Witness; Anemia due to other cause (07/07/2014); and Allergic rhinitis (07/07/2014).  has past surgical history that includes Total hip arthroplasty (Right, 1980); AV fistula placement (Left, ~ 10/2013); and Closed reduction hip dislocation (Right, 1970's). Prior to Admission medications   Medication Sig Start Date End Date Taking? Authorizing Provider  acetaminophen (TYLENOL) 650 MG CR tablet Take 650 mg by mouth every 8 (eight) hours as needed for pain.    Historical Provider, MD  allopurinol (ZYLOPRIM) 100 MG tablet TAKE ONE TABLET BY MOUTH TWICE DAILY 06/28/14   Lyndal Pulley, DO  amiodarone (PACERONE) 200 MG tablet Take 1 tablet (200 mg total) by mouth daily. 10/08/14   Biagio Borg, MD  amitriptyline (ELAVIL) 50 MG tablet TAKE ONE TABLET BY MOUTH ONCE DAILY AT BEDTIME 08/06/14   Biagio Borg, MD  Blood Glucose Monitoring Suppl (ONE TOUCH ULTRA 2) W/DEVICE KIT Use to check blood sugars twice a day  Dx E11.9 10/29/14   Biagio Borg, MD  carvedilol (COREG) 25 MG tablet Take 25 mg by mouth 2 (two) times daily with a meal.    Historical Provider, MD  docusate sodium (COLACE) 100 MG capsule Take 100 mg by mouth daily.    Historical Provider, MD  glucose blood (ONE TOUCH ULTRA TEST) test strip 1 each by Other route 2 (two) times daily. Use to check blood sugars twice a day Dx E11.9 10/29/14   Biagio Borg, MD  hydrOXYzine (ATARAX/VISTARIL) 25 MG tablet Take 1  tablet (25 mg total) by mouth 3 (three) times daily as needed for itching. 10/13/14   Biagio Borg, MD  JANUVIA 25 MG tablet TAKE ONE TABLET BY MOUTH ONCE DAILY 10/19/14   Biagio Borg, MD  Lancets [facility] ULTRASOFT) lancets 1 each by Other route 2 (two) times daily. Use to help check blood sugars twice a day Dx E11.9 10/29/14   Biagio Borg, MD  loperamide (IMODIUM A-D) 2 MG tablet Take 2 mg by mouth 4 (four) times daily as needed for diarrhea or loose stools.    Historical Provider, MD  ondansetron (ZOFRAN) 4 MG tablet Take 1 tablet (4 mg total) by mouth every 6 (six) hours. 08/31/14   Theodis Blaze, MD  ranitidine (ZANTAC) 150 MG tablet Take 150 mg by mouth daily as needed for heartburn.    Historical Provider, MD  sevelamer carbonate (RENVELA) 800 MG tablet Take 1,600 mg by mouth 3 (three) times daily with meals.     Historical Provider, MD  simvastatin (ZOCOR) 20 MG tablet TAKE ONE TABLET BY MOUTH ONCE DAILY 08/06/14   Biagio Borg, MD  sodium chloride (OCEAN) 0.65 % SOLN nasal spray Place 1 spray into both nostrils as needed for congestion. 01/31/14   Shanker Kristeen Mans, MD  tadalafil (CIALIS) 20 MG tablet Take 1 tablet (20 mg total) by mouth daily as needed for erectile dysfunction. 10/27/13   Biagio Borg, MD  warfarin (COUMADIN) 3 MG tablet Please take as directed by anticoagulation clinic. 10/11/14   Biagio Borg, MD   No Known Allergies  FAMILY HISTORY:  Family History  Problem Relation Age of Onset  . Arthritis Other   . Heart disease Other   . Hyperlipidemia Other   . Stroke Other   . Hypertension Other   . Kidney disease Other   . Diabetes Other     SOCIAL HISTORY:  reports that he has never smoked. He has never used smokeless tobacco. He reports that he drinks about 1.5 - 2.0 oz of alcohol per week. He reports that he does not use illicit drugs.  REVIEW OF SYSTEMS:  Unable to obtain as pt is encephalopathic.  SUBJECTIVE:   VITAL SIGNS: Temp:  [96.3 F (35.7 C)-96.4 F  (35.8 C)] 96.4 F (35.8 C) (11/21 2015) Pulse Rate:  [50-105] 71 (11/21 2005) Resp:  [10-30] 10 (11/21 2015) BP: (93-215)/(54-118) 102/56 mmHg (11/21 2015) SpO2:  [98 %-100 %] 98 % (11/21 2005) FiO2 (%):  [100 %] 100 % (11/21 1945) Weight:  [112 kg (246 lb 14.6 oz)] 112 kg (246 lb 14.6 oz) (11/21 1849) HEMODYNAMICS:   VENTILATOR SETTINGS: Vent Mode:  [-] PRVC FiO2 (%):  [100 %] 100 % Set Rate:  [20 bmp] 20 bmp Vt Set:  [550 mL] 550 mL PEEP:  [5 cmH20] 5 cmH20 Plateau Pressure:  [25 cmH20-27 cmH20] 27 cmH20 INTAKE / OUTPUT: Intake/Output    None  PHYSICAL EXAMINATION: General: Adult male, in NAD. Neuro: Sedated, withdraws to pain. HEENT: [place]/AT. PERRL. Cardiovascular: IRIR, no M/R/G.  Lungs: Respirations even and unlabored.  Coarse bilaterally. Abdomen: Obese, BS x 4, soft, NT/ND.  Musculoskeletal: R BKA, no edema.  Skin: Intact, warm, no rashes.  LABS:  CBC  Recent Labs Lab 01/03/15 1927 01/03/15 1958  WBC  --  9.7  HGB 11.9* 10.8*  HCT 35.0* 33.8*  PLT  --  210   Coag's No results for input(s): APTT, INR in the last 168 hours. BMET  Recent Labs Lab 01/03/15 1927  NA 128*  K 6.4*  CL 97*  BUN 96*  CREATININE 10.70*  GLUCOSE 150*   Electrolytes No results for input(s): CALCIUM, MG, PHOS in the last 168 hours. Sepsis Markers No results for input(s): LATICACIDVEN, PROCALCITON, O2SATVEN in the last 168 hours. ABG  Recent Labs Lab 01/03/15 1958  PHART 7.266*  PCO2ART 47.8*  PO2ART 338.0*   Liver Enzymes No results for input(s): AST, ALT, ALKPHOS, BILITOT, ALBUMIN in the last 168 hours. Cardiac Enzymes No results for input(s): TROPONINI, PROBNP in the last 168 hours. Glucose No results for input(s): GLUCAP in the last 168 hours.  Imaging No results found.    ASSESSMENT / PLAN:  CARDIOVASCULAR CVL R fem 11/21 >>> A:  PEA arrest due to hyperkalemia - pt missed HD 11/21. 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 P:  Goal MAP > 65. Trend troponin / lactate. Heparin gtt in lieu of outpatient warfarin. Continue outpatient amiodarone, simvastatin. Hold outpatient carvedilol. Consider echo.  RENAL A:   ESRD (M/W/F) - pt missed dialysis 11/21. Hyperkalemia - due to above. Hyponatremia - likely hypervolemic. Hypocalcemia. P:   Nephrology consulted by EDP. NS @ 100. 1g Ca gluconate. Assess ionized calcium. BMP in AM.  PULMONARY OETT 11/21 >>> A: VDRF due to cardiac arrest after pt missed HD 11/21. Pulmonary HTN - PAP 68 on echo from July 2016. OSA / OHS - pt reportedly hasn't used CPAP in years. P:   Full mechanical support, wean as able. VAP prevention measures. SBT in AM if able. CXR in AM.  GASTROINTESTINAL A:   GERD. Nutrition. Morbid Obesity. P:   SUP: Pantoprazole. NPO.  HEMATOLOGIC A:   [name] - 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! Anemia of chronic disease. On chronic anticoagulation due to a.fib. P:  SCD's / heparin gtt in lieu of outpatient warfarin. CBC in AM.  INFECTIOUS A:   No indication of infection. P:   Monitor clinically.  ENDOCRINE A:   DM.   P:   SSI. HOld outpatient januvia.  NEUROLOGIC A:   Acute metabolic encephalopathy due to sedation. Hx CVA. P:   Sedation:  Fentanyl gtt / Midazolam PRN. RASS goal: 0 to -1. Daily WUA. Hold outpatient amitriptyline.  Family updated: Brother at bedside.  Interdisciplinary Family Meeting v Palliative Care Meeting:  Due by: 11/27.   Montey Hora, [place] [facility] Pager: [phone]  or [phone] 01/03/2015, 8:24 PM  Attending Note:  59 year old male with PMH history of ESRD and frequent misses of dialysis who missed dialysis again and EMS was called for SOB and in the ED the patient suffered a cardiac arrest for 10 minutes but became purposeful afterwards.  PCCM was called to admit.  On exam he is following commands off  propofol so will not perform  hypothermia.  Will keep sedated until arrives to the ICU then post dialysis will consider extubation.  The patient is critically ill with multiple organ systems failure and requires high complexity decision making for assessment and support, frequent evaluation and titration of therapies, application of advanced monitoring technologies and extensive interpretation of multiple databases.   Critical Care Time devoted to patient care services described in this note is  45  Minutes. This time reflects time of care of this signee Dr Jennet Maduro. This critical care time does not reflect procedure time, or teaching time or supervisory time of PA/NP/Med student/Med Resident etc but could involve care discussion time.  Rush Farmer, M.D. [facility] Pulmonary/Critical Care Medicine. Pager: [phone]. After hours pager: [phone].

## 2015-01-03 NOTE — ED Notes (Signed)
Attempted report 

## 2015-01-03 NOTE — Code Documentation (Signed)
IO placed in Right Leg by MD Messner.

## 2015-01-03 NOTE — Progress Notes (Signed)
Changed pt to CPAP/PS 5/5 per MD

## 2015-01-03 NOTE — Code Documentation (Signed)
CBG 128 

## 2015-01-03 NOTE — ED Provider Notes (Signed)
CSN: [id number]     Arrival date & time 01/03/15  1841 History   First MD Initiated Contact with Patient 01/03/15 1930     No chief complaint on file.    (Consider location/radiation/quality/duration/timing/severity/associated sxs/prior Treatment) Patient is a 59 y.o. male presenting with altered mental status.  Altered Mental Status Presenting symptoms: unresponsiveness   Severity:  Severe Most recent episode:  Today Episode history:  Single Progression:  Unable to specify Chronicity:  Recurrent  Per EMS the patient called out for weakness when he had to the patient's house he was slumped on the floor but was alert and oriented city had weakness and could not get up. On initial examination patient had a heart rate in the 30s Y complex. The patient's external pace occurs and started pacing the patient. Apparently the patient was alert and oriented but decreasing mental status on the way here. On arrival here the patient was unresponsive.  Past Medical History  Diagnosis Date  . Glaucoma   . Hypertension   . Hyperlipidemia   . Atrial fibrillation 12/30/2012  . CHF (congestive heart failure) 12/30/2012  . Diabetic retinopathy 12/30/2012  . Morbid obesity 12/30/2012  . Pulmonary hypertension 12/30/2012  . Myocardial infarction ([place])     "I've had ~ 3; last one was in ~ 10/2013" (01/30/2014)  . Diabetes mellitus with neuropathy   . OSA (obstructive sleep apnea) 12/30/2012    "lost weight; no longer needed CPAP; retested said I needed it; didn't followup cause I was feeling fine" (01/30/2014)  . GERD (gastroesophageal reflux disease)   . Stroke ~ 2005; ~ 2005    "they were mild; I didn't even notice I'd had them"; denies residual on 01/30/2014  . History of gout     "right big toe"  . Arthritis     "all over"  . Chronic lower back pain   . ESRD (end stage renal disease) on dialysis started in 2013    "MWF; [facility]; [address]" (01/30/2014)  . Refusal of blood transfusions  as patient is Jehovah's Witness   . Anemia due to other cause 07/07/2014  . Allergic rhinitis 07/07/2014   Past Surgical History  Procedure Laterality Date  . Total hip arthroplasty Right 1980  . Av fistula placement Left ~ 10/2013    "forearm"  . Closed reduction hip dislocation Right 1970's   Family History  Problem Relation Age of Onset  . Arthritis Other   . Heart disease Other   . Hyperlipidemia Other   . Stroke Other   . Hypertension Other   . Kidney disease Other   . Diabetes Other    Social History  Substance Use Topics  . Smoking status: Never Smoker   . Smokeless tobacco: Never Used  . Alcohol Use: 1.5 - 2.0 oz/week    3-4 Standard drinks or equivalent per week     Comment: 01/30/2014 "none in the last 3 weeks; primarily drink w/my friends on the weekends"    Review of Systems  Unable to perform ROS: Patient unresponsive      Allergies  Review of patient's allergies indicates no known allergies.  Home Medications   Prior to Admission medications   Medication Sig Start Date End Date Taking? Authorizing Provider  acetaminophen (TYLENOL) 650 MG CR tablet Take 650 mg by mouth every 8 (eight) hours as needed for pain.    Historical Provider, MD  allopurinol (ZYLOPRIM) 100 MG tablet TAKE ONE TABLET BY MOUTH TWICE DAILY 06/28/14   Olevia Bowens  Smith, DO  amiodarone (PACERONE) 200 MG tablet Take 1 tablet (200 mg total) by mouth daily. 10/08/14   Biagio Borg, MD  amitriptyline (ELAVIL) 50 MG tablet TAKE ONE TABLET BY MOUTH ONCE DAILY AT BEDTIME 08/06/14   Biagio Borg, MD  Blood Glucose Monitoring Suppl (ONE TOUCH ULTRA 2) W/DEVICE KIT Use to check blood sugars twice a day Dx E11.9 10/29/14   Biagio Borg, MD  carvedilol (COREG) 25 MG tablet Take 25 mg by mouth 2 (two) times daily with a meal.    Historical Provider, MD  docusate sodium (COLACE) 100 MG capsule Take 100 mg by mouth daily.    Historical Provider, MD  glucose blood (ONE TOUCH ULTRA TEST) test strip 1 each by  Other route 2 (two) times daily. Use to check blood sugars twice a day Dx E11.9 10/29/14   Biagio Borg, MD  hydrOXYzine (ATARAX/VISTARIL) 25 MG tablet Take 1 tablet (25 mg total) by mouth 3 (three) times daily as needed for itching. 10/13/14   Biagio Borg, MD  JANUVIA 25 MG tablet TAKE ONE TABLET BY MOUTH ONCE DAILY 10/19/14   Biagio Borg, MD  Lancets [facility] ULTRASOFT) lancets 1 each by Other route 2 (two) times daily. Use to help check blood sugars twice a day Dx E11.9 10/29/14   Biagio Borg, MD  loperamide (IMODIUM A-D) 2 MG tablet Take 2 mg by mouth 4 (four) times daily as needed for diarrhea or loose stools.    Historical Provider, MD  ondansetron (ZOFRAN) 4 MG tablet Take 1 tablet (4 mg total) by mouth every 6 (six) hours. 08/31/14   Theodis Blaze, MD  ranitidine (ZANTAC) 150 MG tablet Take 150 mg by mouth daily as needed for heartburn.    Historical Provider, MD  sevelamer carbonate (RENVELA) 800 MG tablet Take 1,600 mg by mouth 3 (three) times daily with meals.     Historical Provider, MD  simvastatin (ZOCOR) 20 MG tablet TAKE ONE TABLET BY MOUTH ONCE DAILY 08/06/14   Biagio Borg, MD  sodium chloride (OCEAN) 0.65 % SOLN nasal spray Place 1 spray into both nostrils as needed for congestion. 01/31/14   Shanker Kristeen Mans, MD  tadalafil (CIALIS) 20 MG tablet Take 1 tablet (20 mg total) by mouth daily as needed for erectile dysfunction. 10/27/13   Biagio Borg, MD  warfarin (COUMADIN) 3 MG tablet Please take as directed by anticoagulation clinic. 10/11/14   Biagio Borg, MD   BP 133/69 mmHg  Pulse 105  Resp 16  Wt 246 lb 14.6 oz (112 kg)  SpO2 100% Physical Exam  Constitutional: He appears distressed.  HENT:  Head: Normocephalic and atraumatic.  Eyes: Conjunctivae are normal. Pupils are equal, round, and reactive to light.  Neck: Normal range of motion. Neck supple.  Cardiovascular: Regular rhythm.  Bradycardia present.   Pulmonary/Chest: Effort normal and breath sounds normal.   Abdominal: Soft. He exhibits no distension.  Musculoskeletal:  Left arm fistula  Neurological: He is unresponsive. GCS eye subscore is 1. GCS verbal subscore is 1. GCS motor subscore is 1.  Nursing note and vitals reviewed.   ED Course  .Intubation Date/Time: 01/06/2015 3:26 PM Performed by: Merrily Pew Authorized by: Merrily Pew Consent: The procedure was performed in an emergent situation. Indications: airway protection Intubation method: direct Patient status: unconscious Preoxygenation: nonrebreather mask Laryngoscope size: Mac 4 Tube size: 7.5 mm Tube type: cuffed Number of attempts: 1 Cricoid pressure: yes Cords visualized: yes  Post-procedure assessment: chest rise,  esophageal detector and ETCO2 monitor Breath sounds: equal ETT to lip: 25 cm Tube secured with: ETT holder Chest x-ray interpreted by me. Chest x-ray findings: endotracheal tube in appropriate position Patient tolerance: Patient tolerated the procedure well with no immediate complications  .Critical Care Performed by: Merrily Pew Authorized by: Merrily Pew Total critical care time: 35 minutes Critical care time was exclusive of separately billable procedures and treating other patients. Critical care was necessary to treat or prevent imminent or life-threatening deterioration of the following conditions: cardiac failure, respiratory failure, shock, metabolic crisis and renal failure. Critical care was time spent personally by me on the following activities: blood draw for specimens, pulse oximetry, vascular access procedures, ventilator management, re-evaluation of patient's condition, ordering and performing treatments and interventions, discussions with consultants, evaluation of patient's response to treatment, ordering and review of laboratory studies, review of old charts and examination of patient.  Linus Salmons Line Date/Time: 01/06/2015 3:28 PM Performed by: Merrily Pew Authorized by:  Merrily Pew Consent: The procedure was performed in an emergent situation. Indications: vascular access Preparation: skin prepped with 2% chlorhexidine Skin prep agent dried: skin prep agent completely dried prior to procedure Sterile barriers: all five maximum sterile barriers used - cap, mask, sterile gown, sterile gloves, and large sterile sheet Hand hygiene: hand hygiene performed prior to central venous catheter insertion Location details: right femoral Site selection rationale: compressions, renal patient, wanted to spare upper vessels Patient position: flat Catheter type: triple lumen Catheter size: 7.5 Fr Ultrasound guidance: yes Sterile ultrasound techniques: sterile gel and sterile probe covers were used Number of attempts: 2 Successful placement: yes Post-procedure: line sutured and dressing applied Assessment: blood return through all ports and free fluid flow Patient tolerance: Patient tolerated the procedure well with no immediate complications  External pacer Date/Time: 01/06/2015 3:35 PM Performed by: Merrily Pew Authorized by: Merrily Pew Consent: The procedure was performed in an emergent situation. Local anesthesia used: no Patient sedated: no Patient tolerance: Patient tolerated the procedure well with no immediate complications   Cardiopulmonary Resuscitation (CPR) Procedure Note Directed/Performed by: Merrily Pew I personally directed ancillary staff and/or performed CPR in an effort to regain return of spontaneous circulation and to maintain cardiac, neuro and systemic perfusion.    (including critical care time) Labs Review Labs Reviewed  I-STAT CHEM 8, ED - Abnormal; Notable for the following:    Sodium 128 (*)    Potassium 6.4 (*)    Chloride 97 (*)    BUN 96 (*)    Creatinine, Ser 10.70 (*)    Glucose, Bld 150 (*)    Calcium, Ion 1.04 (*)    Hemoglobin 11.9 (*)    HCT 35.0 (*)    All other components within normal limits   TRIGLYCERIDES    Imaging Review No results found. I have personally reviewed and evaluated these images and lab results as part of my medical decision-making.   EKG Interpretation None      MDM   Final diagnoses:  Chest pain  Bradycardia Cardiac Arrest Hyperkalemia Respiratory Failure   59 year old male with unknown medical history initially told out for weakness and lethargy found to have a wide complex bradycardia per EMS but was alert, however he started external pacing,  on the arrival here the patient was unresponsive. Pacing was continued however when switched over and did a rhythm check patient had no pulse so chest compressions were started. ACLS performed for approximately 10 minutes as noted in the  flow chart. He had a fistula in his left arm and with the presence of a wide complex bradycardia as his presenting rhythm to light hyperkalemia was the most likely etiology of his arrest so bicarbonate calcium epinephrine were given throughout the code. After approximately 10 minutes and the patient being intubated and access being gained via IO in the right tibia the patient regained pulses. Blood pressure was stable still wide complex however the heart rate initially was in the low 100s and settled in the 50s and 60s. This is consistent with out initial thought of hyperkalemia. EKG without any obvious ST changes. I-STAT labs were sent and is had a potassium 6.4 which was after treatment and after Bicarbonate drip was started. Discussed the case with nephrology who activated the emergent dialysis team and was waiting for the patient to go upstairs to an ICU bed prior to initiating dialysis. Discussed the case with the critical care team who came down and admitted the patient to the hospital.     Merrily Pew, MD 01/06/15 1540

## 2015-01-03 NOTE — Code Documentation (Signed)
At 18:48 ROSC obtained. Airway also intact. Stable rhythm, wide complex noted on monitor. BP 218/115. HR @ 80, regular.

## 2015-01-04 ENCOUNTER — Inpatient Hospital Stay (HOSPITAL_COMMUNITY): Payer: Medicare Other

## 2015-01-04 LAB — CBC
HEMATOCRIT: 31.9 % — AB (ref 39.0–52.0)
HEMOGLOBIN: 10.6 g/dL — AB (ref 13.0–17.0)
MCH: 31.7 pg (ref 26.0–34.0)
MCHC: 33.2 g/dL (ref 30.0–36.0)
MCV: 95.5 fL (ref 78.0–100.0)
Platelets: 225 10*3/uL (ref 150–400)
RBC: 3.34 MIL/uL — ABNORMAL LOW (ref 4.22–5.81)
RDW: 16.4 % — ABNORMAL HIGH (ref 11.5–15.5)
WBC: 11 10*3/uL — ABNORMAL HIGH (ref 4.0–10.5)

## 2015-01-04 LAB — BASIC METABOLIC PANEL
Anion gap: 17 — ABNORMAL HIGH (ref 5–15)
Anion gap: 13 (ref 5–15)
Anion gap: 13 (ref 5–15)
BUN: 93 mg/dL — AB (ref 6–20)
BUN: 26 mg/dL — ABNORMAL HIGH (ref 6–20)
BUN: 32 mg/dL — ABNORMAL HIGH (ref 6–20)
CALCIUM: 7.3 mg/dL — AB (ref 8.9–10.3)
CALCIUM: 7.7 mg/dL — AB (ref 8.9–10.3)
CHLORIDE: 95 mmol/L — AB (ref 101–111)
CHLORIDE: 94 mmol/L — AB (ref 101–111)
CO2: 20 mmol/L — AB (ref 22–32)
CO2: 27 mmol/L (ref 22–32)
CO2: 29 mmol/L (ref 22–32)
CREATININE: 11.33 mg/dL — AB (ref 0.61–1.24)
CREATININE: 4.18 mg/dL — AB (ref 0.61–1.24)
CREATININE: 5.78 mg/dL — AB (ref 0.61–1.24)
Calcium: 7.5 mg/dL — ABNORMAL LOW (ref 8.9–10.3)
Chloride: 93 mmol/L — ABNORMAL LOW (ref 101–111)
GFR calc Af Amer: 5 mL/min — ABNORMAL LOW (ref >60)
GFR calc non Af Amer: 14 mL/min — ABNORMAL LOW (ref >60)
GFR calc non Af Amer: 10 mL/min — ABNORMAL LOW (ref >60)
GFR, EST AFRICAN AMERICAN: 16 mL/min — AB (ref >60)
GFR, EST AFRICAN AMERICAN: 11 mL/min — AB (ref >60)
GFR, EST NON AFRICAN AMERICAN: 4 mL/min — AB (ref >60)
GLUCOSE: 138 mg/dL — AB (ref 65–99)
GLUCOSE: 98 mg/dL (ref 65–99)
GLUCOSE: 97 mg/dL (ref 65–99)
Potassium: >7.5 mmol/L (ref 3.5–5.1)
Potassium: 3 mmol/L — ABNORMAL LOW (ref 3.5–5.1)
Potassium: 4.1 mmol/L (ref 3.5–5.1)
Sodium: 130 mmol/L — ABNORMAL LOW (ref 135–145)
Sodium: 135 mmol/L (ref 135–145)
Sodium: 136 mmol/L (ref 135–145)

## 2015-01-04 LAB — GLUCOSE, CAPILLARY
GLUCOSE-CAPILLARY: 74 mg/dL (ref 65–99)
GLUCOSE-CAPILLARY: 164 mg/dL — AB (ref 65–99)
Glucose-Capillary: 82 mg/dL (ref 65–99)
Glucose-Capillary: 239 mg/dL — ABNORMAL HIGH (ref 65–99)

## 2015-01-04 LAB — MAGNESIUM: Magnesium: 1.9 mg/dL (ref 1.7–2.4)

## 2015-01-04 LAB — PHOSPHORUS: PHOSPHORUS: 6.4 mg/dL — AB (ref 2.5–4.6)

## 2015-01-04 LAB — TROPONIN I
TROPONIN I: 0.06 ng/mL — AB (ref <0.031)
Troponin I: 0.06 ng/mL — ABNORMAL HIGH (ref <0.031)

## 2015-01-04 LAB — PROTIME-INR
INR: 3.09 — AB (ref 0.00–1.49)
Prothrombin Time: 31.3 seconds — ABNORMAL HIGH (ref 11.6–15.2)

## 2015-01-04 LAB — MRSA PCR SCREENING: MRSA BY PCR: NEGATIVE

## 2015-01-04 LAB — LACTIC ACID, PLASMA: Lactic Acid, Venous: 0.9 mmol/L (ref 0.5–2.0)

## 2015-01-04 MED ORDER — WARFARIN SODIUM 2 MG PO TABS
2.0000 mg | ORAL_TABLET | Freq: Once | ORAL | Status: AC
  Administered 2015-01-04: 2 mg via ORAL
  Filled 2015-01-04: qty 1

## 2015-01-04 MED ORDER — PREDNISONE 20 MG PO TABS
20.0000 mg | ORAL_TABLET | Freq: Every day | ORAL | Status: AC
  Administered 2015-01-04 – 2015-01-06 (×3): 20 mg via ORAL
  Filled 2015-01-04 (×4): qty 1

## 2015-01-04 MED ORDER — WARFARIN - PHARMACIST DOSING INPATIENT: Freq: Every day | Status: DC

## 2015-01-04 MED ORDER — COLLAGENASE 250 UNIT/GM EX OINT
TOPICAL_OINTMENT | Freq: Every day | CUTANEOUS | Status: DC
  Administered 2015-01-05 – 2015-01-08 (×4): via TOPICAL
  Filled 2015-01-04 (×2): qty 30

## 2015-01-04 MED ORDER — SEVELAMER CARBONATE 800 MG PO TABS
2400.0000 mg | ORAL_TABLET | Freq: Three times a day (TID) | ORAL | Status: DC
  Administered 2015-01-04 – 2015-01-08 (×8): 2400 mg via ORAL
  Filled 2015-01-04 (×9): qty 3

## 2015-01-04 MED FILL — Medication: Qty: 1 | Status: AC

## 2015-01-04 NOTE — Progress Notes (Signed)
Patient ID: Daniel Whitney, male   DOB: Jun 03, 1955, 59 y.o.   MRN: [id number]  [facility] Progress Note    Subjective:   Feels better but doesn't remember much.   Objective:   BP 97/49 mmHg  Pulse 61  Temp(Src) 98 F (36.7 C) (Axillary)  Resp 18  Ht 5\' 8"  (1.727 m)  Wt 110.2 kg (242 lb 15.2 oz)  BMI 36.95 kg/m2  SpO2 94%  Intake/Output: I/O last 3 completed shifts: In: 1275.4 [I.V.:1275.4] Out: [id number] [Urine:175; Other:3400]   Intake/Output this shift:    Weight change:   Physical Exam: Gen:WD obese AAM in NAd CVS: IRR IRR, no rub Resp:cta [id number] Ext:1+ pedal edema, L AVF +T/B, ulcerated/necrotic lesions on right calf with eschar.  Labs: BMET  Recent Labs Lab 01/03/15 1927 01/03/15 1958 01/03/15 2143 01/04/15 0220 01/04/15 0528 01/04/15 0529  NA 128* 130* 130* 135 136  --   K 6.4* 7.0* >7.5* 3.0* 4.1  --   CL 97* 94* 93* 95* 94*  --   CO2  --  19* 20* 27 29  --   GLUCOSE 150* 150* 138* 98 97  --   BUN 96* 91* 93* 26* 32*  --   CREATININE 10.70* 11.10* 11.33* 4.18* 5.78*  --   ALBUMIN  --  2.9*  --   --   --   --   CALCIUM  --  8.8* 7.3* 7.7* 7.5*  --   PHOS  --   --   --   --   --  6.4*   CBC  Recent Labs Lab 01/03/15 1927 01/03/15 1958 01/04/15 0529  WBC  --  9.7 11.0*  NEUTROABS  --  7.7  --   HGB 11.9* 10.8* 10.6*  HCT 35.0* 33.8* 31.9*  MCV  --  97.4 95.5  PLT  --  210 225    @IMGRELPRIORS @ Medications:    . amiodarone  200 mg Per Tube Daily  . collagenase   Topical Daily  . doxercalciferol  5 mcg Intravenous Q M,W,F-HD  . insulin aspart  0-20 Units Subcutaneous 6 times per day  . predniSONE  20 mg Oral Q breakfast  . simvastatin  20 mg Per Tube QHS   Dialysis Prescription [facility] 108 kg 4 hours BFR 450 DFR A 1.5 2K 2Ca Left AVF Mircera 100 Q2weeks (last dosed 11/16) Hectorol 5 mcg   Assessment/ Plan:   1. Bradycardic/Cardiac arrest - presumably related to severe hyperkalemia for this episode,  however he had an asystolic arrest in July.  Improved after HD. Cont with telemetry. Would strongly recommend cardiology evaluation due to cardiac events.  Has known cardiomyopathy and may benefit from further evaluation/treatment. 2. Hyperkalemia- possibly related to dietary indiscretion.  Improved after HD. Cont to follow. 3. ESRD- plan for HD again tomorrow  4. Anemia: on Micera dosed last week. Continue to follow. 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  Restart binders and follow 6. Nutrition: resume renal diet 7. Hypertension:stable to low today.  Hold off on more HD and UF today due to borderline BP 8. Right leg ulcers- ?decubiti vs calciphylaxis.  Wound care nurse following and starting therapy.  Continue to monitor and consider deep tissue biopsy if not improving. 9. Memory impairment- pt admits to worsening memory over the last several months.  Consider imaging and neuro eval (could be done as an outpt).  Did have some anoxic injury during his arrest in July.  10. Cardiomyopathy- EF 20%. Recommend cardiology evaluation as I don't see that he has been seen before.  11. A fib- on coumadin/amio PTA, pharmacy managing anticoagulation. 12. VDRF due to AMS and cardiac arrest. Extubated and doing better.  Donetta Potts, MD [place] Pager [phone] 01/04/2015, 10:56 AM

## 2015-01-04 NOTE — Consult Note (Signed)
WOC wound consult note Reason for Consult: RLE wound Wound type: full thickness ulcer, unclear etiology.  Palpable pulses bilaterally, 1+ edema.  Patient reports bumping the area about a month ago, since has not healed.  Followed by primary care MD.  Has been treating this with triple antibiotic ointment at home x 1 month. Pressure Ulcer POA: No Measurement: 2 areas 0.5cm x 0.5cm x 0.2cm and 1.0cm x 1.0cm x 0.2cm  Wound bed: 100% yellow/black soft slough in both wound beds Drainage (amount, consistency, odor) minimal, serosanguinous   Periwound: intact  Dressing procedure/placement/frequency: Add enzymatic debridement ointment to the RLE, cover with 2x2 moist gauze, secure with gauze. Change daily.  Discussed POC with patient and bedside nurse.  Re consult if needed, will not follow at this time. Thanks  [name], [place] [phone])

## 2015-01-04 NOTE — Progress Notes (Signed)
[facility] Progress Note Patient Name: [name] DOB: 03-Jun-1955 MRN: [id number]   Date of Service  01/04/2015  HPI/Events of Note  Chest pain, troponin 0.06  eICU Interventions  Check EKG, continue to follow troponins.     Intervention Category Intermediate Interventions: Other:  Cleotha Whalin 01/04/2015, 6:41 AM

## 2015-01-04 NOTE — Progress Notes (Signed)
ANTICOAGULATION CONSULT NOTE - Initial Consult  Pharmacy Consult for heparin Indication: atrial fibrillation  No Known Allergies  Patient Measurements: Height: 5\' 9"  (175.3 cm) Weight: 242 lb 15.2 oz (110.2 kg) IBW/kg (Calculated) : 70.7 Heparin Dosing Weight: 90kg  Vital Signs: Temp: 99.1 F (37.3 C) (11/22 1128) Temp Source: Oral (11/22 1128) BP: 110/63 mmHg (11/22 1128) Pulse Rate: 60 (11/22 1128)  Labs:  Recent Labs  01/03/15 1927  01/03/15 1929 01/03/15 1958 01/03/15 2143 01/04/15 0220 01/04/15 0528 01/04/15 0529 01/04/15 0728  HGB 11.9*  --   --  10.8*  --   --   --  10.6*  --   HCT 35.0*  --   --  33.8*  --   --   --  31.9*  --   PLT  --   --   --  210  --   --   --  225  --   LABPROT  --   --  33.0*  --   --   --   --  31.3*  --   INR  --   --  3.31*  --   --   --   --  3.09*  --   CREATININE 10.70*  --   --  11.10* 11.33* 4.18* 5.78*  --   --   TROPONINI  --   < > <0.03 <0.03  --  0.06*  --   --  0.06*  < > = values in this interval not displayed.  Estimated Creatinine Clearance: 16.6 mL/min (by C-G formula based on Cr of 5.78).   Medical History: Past Medical History  Diagnosis Date  . Glaucoma   . Hypertension   . Hyperlipidemia   . Atrial fibrillation 12/30/2012  . CHF (congestive heart failure) 12/30/2012  . Diabetic retinopathy 12/30/2012  . Morbid obesity 12/30/2012  . Pulmonary hypertension 12/30/2012  . Myocardial infarction ([place])     "I've had ~ 3; last one was in ~ 10/2013" (01/30/2014)  . Diabetes mellitus with neuropathy   . OSA (obstructive sleep apnea) 12/30/2012    "lost weight; no longer needed CPAP; retested said I needed it; didn't followup cause I was feeling fine" (01/30/2014)  . GERD (gastroesophageal reflux disease)   . Stroke ~ 2005; ~ 2005    "they were mild; I didn't even notice I'd had them"; denies residual on 01/30/2014  . History of gout     "right big toe"  . Arthritis     "all over"  . Chronic lower back pain    . ESRD (end stage renal disease) on dialysis started in 2013    "MWF; [facility]; [address]" (01/30/2014)  . Refusal of blood transfusions as patient is Jehovah's Witness   . Anemia due to other cause 07/07/2014  . Allergic rhinitis 07/07/2014    Assessment: 61 yom with ESRD on HD to ED with fall, now s/p cardiac arrest in ED. Pharmacy consult for heparin d/c'd and now restarting Coumadin as patient can tolerate PO. Warfarin pta - unsure of last dose, no recent anticoag visits since 11/02/14 on MAR, documented home dose is 3 mg daily except 4.5 mg on Thursdays  INR supratherapeutic 3.31 on admit, decreased to 3.09 in less than 12 hours.  Hg 10.8 plt wnl. No bleed documented.   Goal of Therapy:  INR 2-3   Plan:  Warfarin 2 mg x1 due to slightly elevated INR F/u INR, s/sx of bleeding  Joya San, PharmD Clinical Pharmacy Resident  Pager # [phone] 01/04/2015 11:55 AM

## 2015-01-04 NOTE — Progress Notes (Signed)
Patient states he does not wear CPAP and does not want to wear one at this time. RT informed patient if he changes his mind have RN contact RT.

## 2015-01-04 NOTE — Progress Notes (Signed)
PULMONARY / CRITICAL CARE MEDICINE   Name: Daniel Whitney MRN: [id number] DOB: [birth year]    ADMISSION DATE:  01/03/2015 CONSULTATION DATE:  01/04/15  REFERRING MD :  EDP  CHIEF COMPLAINT:  Cardiac Arrest  INITIAL PRESENTATION:  59 y.o. male who is  [facility] WITNESS, with hx ESRD (M/W/F).  He was scheduled for dialysis Mon 11/21; however, he felt bad so he rescheduled for Tues 11/22 (last session Fri 11/18).  He was brought to [facility] ED 11/21 after he collapsed at home and required TC pacing en route for HR 40s  In ED, found to have hyperkalemia and had 10 min PEA arrest during which he was intubated.  PCCM called for admission.  STUDIES:  CXR 11/21 >>> Patchy airspace disease in the lingula and bilateral perihilar regions   SIGNIFICANT EVENTS: 11/21 - admitted after PEA arrest (K AFTER intervention 6.4 > pt missed dialysis session on day of admit). 10 minutes CPR with intubation, CVL placed. Emergent HD. Extubated  SUBJECTIVE: Patient endorsed chest pain this AM, EKG and troponins ordered. When discussing with pt, it is anterior chest wall pain. States it is better but still present. No SOB.   VITAL SIGNS: Temp:  [95.7 F (35.4 C)-97.3 F (36.3 C)] 97.3 F (36.3 C) (11/22 0400) Pulse Rate:  [40-105] 61 (11/22 0600) Resp:  [0-30] 20 (11/22 0600) BP: (93-215)/(51-118) 100/53 mmHg (11/22 0600) SpO2:  [93 %-100 %] 100 % (11/22 0600) FiO2 (%):  [40 %-100 %] 40 % (11/22 0015) Weight:  [110.2 kg (242 lb 15.2 oz)-113.2 kg (249 lb 9 oz)] 110.2 kg (242 lb 15.2 oz) (11/22 0240) HEMODYNAMICS:   VENTILATOR SETTINGS: Vent Mode:  [-] PSV;PCV FiO2 (%):  [40 %-100 %] 40 % Set Rate:  [20 bmp-24 bmp] 22 bmp Vt Set:  [550 mL] 550 mL PEEP:  [5 cmH20] 5 cmH20 Pressure Support:  [5 cmH20-10 cmH20] 10 cmH20 Plateau Pressure:  [25 cmH20-29 cmH20] 29 cmH20 INTAKE / OUTPUT: Intake/Output      11/21 0701 - 11/22 0700 11/22 0701 - 11/23 0700   I.V. (mL/kg) 1275.4 (11.6)    Total Intake(mL/kg) 1275.4  (11.6)    Urine (mL/kg/hr) 175    Other 3400    Total Output 3575     Net -2299.6            PHYSICAL EXAMINATION: General: Adult male, in NAD sitting up in bedside chair in NAD  Neuro: Alert and oriented. Follows commands. HEENT: [place]/AT. PERRL. Cardiovascular: Bradycardic, irregular, no M/R/G. No pitting edema.  Lungs: Respirations even and unlabored. CTAB. Abdomen: Obese, BS x 4, soft, NT/ND.  Musculoskeletal: Abrasion inferior to R knee wrapped, abrasion over  L anterior shin wrapped and clean.  Skin: Intact, warm, no rashes.  LABS:  CBC  Recent Labs Lab 01/03/15 1927 01/03/15 1958 01/04/15 0529  WBC  --  9.7 11.0*  HGB 11.9* 10.8* 10.6*  HCT 35.0* 33.8* 31.9*  PLT  --  210 225   Coag's  Recent Labs Lab 01/03/15 1929 01/04/15 0529  INR 3.31* 3.09*   BMET  Recent Labs Lab 01/03/15 2143 01/04/15 0220 01/04/15 0528  NA 130* 135 136  K >7.5* 3.0* 4.1  CL 93* 95* 94*  CO2 20* 27 29  BUN 93* 26* 32*  CREATININE 11.33* 4.18* 5.78*  GLUCOSE 138* 98 97   Electrolytes  Recent Labs Lab 01/03/15 2143 01/04/15 0220 01/04/15 0528 01/04/15 0529  CALCIUM 7.3* 7.7* 7.5*  --   MG  --   --   --  1.9  PHOS  --   --   --  6.4*   Sepsis Markers  Recent Labs Lab 01/03/15 1929 01/03/15 2306 01/04/15 0220  LATICACIDVEN 2.8* 1.2 0.9   ABG  Recent Labs Lab 01/03/15 1958  PHART 7.266*  PCO2ART 47.8*  PO2ART 338.0*   Liver Enzymes  Recent Labs Lab 01/03/15 1958  AST 51*  ALT 56  ALKPHOS 161*  BILITOT 0.7  ALBUMIN 2.9*   Cardiac Enzymes  Recent Labs Lab 01/03/15 1929 01/03/15 1958 01/04/15 0220  TROPONINI <0.03 <0.03 0.06*   Glucose  Recent Labs Lab 01/03/15 1851 01/03/15 2105 01/03/15 2236 01/04/15 0406  GLUCAP 128* 110* 111* 82    Imaging Dg Chest Port 1 View  01/04/2015  CLINICAL DATA:  Respiratory failure EXAM: PORTABLE CHEST 1 VIEW COMPARISON:  January 03, 2015 FINDINGS: Endotracheal tube and nasogastric tube have been  removed. No pneumothorax. Patchy opacity in the lingula and perihilar regions remains without change. No new opacity. Heart is upper normal in size with pulmonary vascularity within normal limits. No adenopathy. IMPRESSION: No pneumothorax. Patchy airspace disease in the lingula and bilateral perihilar regions remains stable. No new opacity. No change in cardiac silhouette. Electronically Signed   By: Lowella Grip III M.D.   On: 01/04/2015 07:06   Dg Chest Portable 1 View  01/03/2015  CLINICAL DATA:  Endotracheal tube placement EXAM: PORTABLE CHEST 1 VIEW COMPARISON:  11/08/2014 FINDINGS: Endotracheal tube tip is 3.6 cm above the carina, well positioned. Nasogastric tube extends into the stomach. Moderate to prominent enlargement of the cardiopericardial silhouette observed. There has been some clearing of the interstitial accentuation although patchy airspace opacities in the lingula and perihilar regions are again observed. Low lung volumes are present, causing crowding of the pulmonary vasculature. No pleural effusion identified. IMPRESSION: 1. Well-positioned endotracheal tube, about 3.6 cm above the carina. Nasogastric tube enters the stomach. 2. Slightly improved aeration although there is some bandlike opacities in the lingula and perihilar regions persisting. 3. Low lung volumes. Electronically Signed   By: Van Clines M.D.   On: 01/03/2015 20:31      ASSESSMENT / PLAN:  CARDIOVASCULAR CVL R fem 11/21 >>> A:  PEA arrest due to hyperkalemia - pt missed HD 11/21. 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 Hypotension- possibly in the setting of recent HD Chest pain likely from cpr P:  Goal MAP > 60 Trend troponin - dc inr in am  Hold amiodarone - brady simvastatin. Hold outpatient carvedilol given hypotension Consider echo for effusion Dc fem line asap  RENAL A:   ESRD (M/W/F) - pt missed dialysis 11/21. Hyperkalemia  - due to above, resolved. Hyponatremia - likely hypervolemic, resolved Hypocalcemia. P:   Nephrology consulted by EDP. KVO S/p 1g Ca gluconate. Assess ionized calcium, pending BMP in AM.   PULMONARY OETT 11/21 >>> 11/21 A: VDRF due to cardiac arrest after pt missed HD 11/21. Pulmonary HTN - PAP 68 on echo from July 2016. OSA / OHS - pt reportedly hasn't used CPAP in years. P:   Supplemental O2 as needed. Consider repeat hd for volume off Order cpap nocturnal  GASTROINTESTINAL A:   GERD. Nutrition. Morbid Obesity. P:   SUP: Pantoprazole; d/c once eating Advance diet as tolerated, renal diet  HEMATOLOGIC A:   [name] - 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! Anemia of chronic disease. On chronic anticoagulation due to a.fib. P:  SCD's / heparin gtt in lieu  of outpatient warfarin. CBC dc Limit blood draws Echo for effusion  INFECTIOUS A:   No indication of infection. P:   Monitor clinically.  ENDOCRINE A:   DM.   P:   SSI. Hold outpatient [place].  NEUROLOGIC A:   Acute metabolic encephalopathy due to sedation. Hx CVA. P:   Consider re-starting outpatient amitriptyline.  Family updated: patient updated at bedside   Patient can be transferred out of the ICU today.  Archie Patten, MD [facility] Resident  01/04/2015, 8:23 AM   STAFF NOTE: Linwood Dibbles, MD FACP have personally reviewed patient's available data, including medical history, events of note, physical examination and test results as part of my evaluation. I have discussed with resident/NP and other care providers such as pharmacist, RN and RRT. In addition, I personally evaluated patient and elicited key findings of: in chair , no resp distress, chest pain likely is from cpr, some crackles, pulm edema remain on pcxr, consier repeat hd, inr repeat in am when less 2, then add coumadin back, no reason for heparin overlap, pcxr in am for edema, dc trop, echo for pericardial effusion,  especially with inr elevation, no indication abx, dc amio with hr 60, coreg hold, tele to remain, to triad, dc fem line today after [employer]. Titus Mould, MD, [place] Pgr: [facility] 01/04/2015 8:28 AM

## 2015-01-04 NOTE — Progress Notes (Signed)
Pt extubated to 3L N/C per MD order. Positive cuff leak, no stridor. Pt able to vocalize

## 2015-01-04 NOTE — Progress Notes (Signed)
[facility] Progress Note Patient Name: Daniel Whitney DOB: 1956/02/02 MRN: [id number]   Date of Service  01/04/2015  HPI/Events of Note  Doing well on weaning trial. Camera check >> awake, alert, stable.  eICU Interventions  Extubate     Intervention Category Intermediate Interventions: Other: Minor Interventions: Agitation / anxiety - evaluation and management;Other:  [name] 01/04/2015, 2:54 AM

## 2015-01-04 NOTE — Progress Notes (Signed)
Report called to [id number]. Report received by Jonelle Sidle, RN. All questions answered. Patient to be transported with monitors in place with Daron Offer, RN

## 2015-01-05 ENCOUNTER — Inpatient Hospital Stay (HOSPITAL_COMMUNITY): Payer: Medicare Other

## 2015-01-05 ENCOUNTER — Encounter (HOSPITAL_COMMUNITY): Payer: Self-pay | Admitting: Cardiology

## 2015-01-05 DIAGNOSIS — N186 End stage renal disease: Secondary | ICD-10-CM

## 2015-01-05 DIAGNOSIS — E1340 Other specified diabetes mellitus with diabetic neuropathy, unspecified: Secondary | ICD-10-CM

## 2015-01-05 DIAGNOSIS — R079 Chest pain, unspecified: Secondary | ICD-10-CM

## 2015-01-05 DIAGNOSIS — I10 Essential (primary) hypertension: Secondary | ICD-10-CM

## 2015-01-05 DIAGNOSIS — Z992 Dependence on renal dialysis: Secondary | ICD-10-CM

## 2015-01-05 DIAGNOSIS — I5043 Acute on chronic combined systolic (congestive) and diastolic (congestive) heart failure: Secondary | ICD-10-CM

## 2015-01-05 LAB — PROTIME-INR
INR: 3.29 — ABNORMAL HIGH (ref 0.00–1.49)
PROTHROMBIN TIME: 32.8 s — AB (ref 11.6–15.2)

## 2015-01-05 LAB — RENAL FUNCTION PANEL
ALBUMIN: 2.9 g/dL — AB (ref 3.5–5.0)
Anion gap: 13 (ref 5–15)
BUN: 53 mg/dL — AB (ref 6–20)
CALCIUM: 6.2 mg/dL — AB (ref 8.9–10.3)
CO2: 28 mmol/L (ref 22–32)
CREATININE: 8.18 mg/dL — AB (ref 0.61–1.24)
Chloride: 91 mmol/L — ABNORMAL LOW (ref 101–111)
GFR calc Af Amer: 7 mL/min — ABNORMAL LOW (ref >60)
GFR, EST NON AFRICAN AMERICAN: 6 mL/min — AB (ref >60)
GLUCOSE: 130 mg/dL — AB (ref 65–99)
PHOSPHORUS: 6.6 mg/dL — AB (ref 2.5–4.6)
POTASSIUM: 5.3 mmol/L — AB (ref 3.5–5.1)
SODIUM: 132 mmol/L — AB (ref 135–145)

## 2015-01-05 LAB — GLUCOSE, CAPILLARY
GLUCOSE-CAPILLARY: 137 mg/dL — AB (ref 65–99)
GLUCOSE-CAPILLARY: 118 mg/dL — AB (ref 65–99)
Glucose-Capillary: 140 mg/dL — ABNORMAL HIGH (ref 65–99)
Glucose-Capillary: 161 mg/dL — ABNORMAL HIGH (ref 65–99)

## 2015-01-05 LAB — CBC
HEMATOCRIT: 31 % — AB (ref 39.0–52.0)
HEMOGLOBIN: 9.8 g/dL — AB (ref 13.0–17.0)
MCH: 30.5 pg (ref 26.0–34.0)
MCHC: 31.6 g/dL (ref 30.0–36.0)
MCV: 96.6 fL (ref 78.0–100.0)
Platelets: 235 10*3/uL (ref 150–400)
RBC: 3.21 MIL/uL — ABNORMAL LOW (ref 4.22–5.81)
RDW: 16.8 % — ABNORMAL HIGH (ref 11.5–15.5)
WBC: 12 10*3/uL — ABNORMAL HIGH (ref 4.0–10.5)

## 2015-01-05 LAB — CALCIUM, IONIZED: Calcium, Ionized, Serum: 4.2 mg/dL — ABNORMAL LOW (ref 4.5–5.6)

## 2015-01-05 MED ORDER — ASPIRIN 81 MG PO CHEW
81.0000 mg | CHEWABLE_TABLET | Freq: Every day | ORAL | Status: DC
  Administered 2015-01-05 – 2015-01-08 (×4): 81 mg via ORAL
  Filled 2015-01-05 (×4): qty 1

## 2015-01-05 MED ORDER — INSULIN ASPART 100 UNIT/ML ~~LOC~~ SOLN
0.0000 [IU] | Freq: Three times a day (TID) | SUBCUTANEOUS | Status: DC
  Administered 2015-01-05 – 2015-01-06 (×2): 1 [IU] via SUBCUTANEOUS
  Administered 2015-01-06: 2 [IU] via SUBCUTANEOUS
  Administered 2015-01-06: 5 [IU] via SUBCUTANEOUS
  Administered 2015-01-07: 2 [IU] via SUBCUTANEOUS

## 2015-01-05 MED ORDER — PERFLUTREN LIPID MICROSPHERE
1.0000 mL | INTRAVENOUS | Status: AC | PRN
  Administered 2015-01-05: 2 mL via INTRAVENOUS
  Filled 2015-01-05: qty 10

## 2015-01-05 MED ORDER — ATORVASTATIN CALCIUM 80 MG PO TABS
80.0000 mg | ORAL_TABLET | Freq: Every day | ORAL | Status: DC
  Administered 2015-01-05 – 2015-01-07 (×3): 80 mg via ORAL
  Filled 2015-01-05 (×3): qty 1

## 2015-01-05 MED ORDER — ACETAMINOPHEN 325 MG PO TABS
650.0000 mg | ORAL_TABLET | Freq: Four times a day (QID) | ORAL | Status: DC | PRN
  Administered 2015-01-05 – 2015-01-06 (×3): 650 mg via ORAL
  Filled 2015-01-05 (×3): qty 2

## 2015-01-05 MED ORDER — HEPARIN SODIUM (PORCINE) 1000 UNIT/ML DIALYSIS: 20.0000 [IU]/kg | INTRAMUSCULAR | Status: DC | PRN

## 2015-01-05 NOTE — Clinical Documentation Improvement (Signed)
Nephrology/Renal Critical Care  Can the diagnosis of CHF be further specified? History of CHF noted in current record; please clarify the acuity and type, if known, of CHF present this admission.      Acuity - Acute, Chronic, Acute on Chronic   Type - Systolic, Diastolic, Systolic and Diastolic  Other  Clinically Undetermined  Please exercise your independent, professional judgment when responding. A specific answer is not anticipated or expected. Please update your documentation within the medical record to reflect your response to this query.  Thank you, Mateo Flow, RN [phone] Clinical Documentation Specialist

## 2015-01-05 NOTE — Progress Notes (Signed)
Patient has refused CPAP for the night. RT will continue to monitor.

## 2015-01-05 NOTE — Procedures (Signed)
Patient was seen on dialysis and the procedure was supervised. BFR 400 Via LAVF BP is 118/61.  Patient appears to be tolerating treatment well.  Still very concerning that he has had 2 cardiac arrests.  Will discuss with primary team to help facilitate cardiac evaluation.

## 2015-01-05 NOTE — Consult Note (Signed)
Primary cardiologist: Dr Debara Pickett  HPI: 59 year old male with past medical history of end-stage renal disease, diabetes mellitus, hypertension, hyperlipidemia, paroxysmal atrial fibrillation, prior stroke for evaluation of recent cardiac arrest. Patient had an echocardiogram in March 2015. LV function normal, mild right ventricular enlargement with mildly reduced RV function. Moderate pulmonary hypertension. Patient was admitted in July 2016 after missing 2 dialysis treatments. He developed acute shortness of breath and chest pain by his report. He then became unresponsive requiring 10 minutes of CPR. The notes also state he had 2 runs of ventricular tachycardia. Echocardiogram at that time showed ejection fraction 20%. ? Takotsubo. Initial K 5.2. Patient had some anoxia following event but ultimately recovered and was discharged. Following discharge there was no chest pain or dyspnea. The patient again noticed dialysis recently because of general malaise and fatigue. He then apparently collapsed and had a PA arrest. His initial potassium was 7. He has now improved and cardiology is asked to evaluate. He has some residual pain in his chest from CPR but otherwise unremarkable symptoms.  Medications Prior to Admission  Medication Sig Dispense Refill  . acetaminophen (TYLENOL) 650 MG CR tablet Take 650 mg by mouth every 8 (eight) hours as needed for pain.    Marland Kitchen allopurinol (ZYLOPRIM) 100 MG tablet TAKE ONE TABLET BY MOUTH TWICE DAILY 180 tablet 0  . amiodarone (PACERONE) 200 MG tablet Take 1 tablet (200 mg total) by mouth daily. 90 tablet 1  . amitriptyline (ELAVIL) 50 MG tablet TAKE ONE TABLET BY MOUTH ONCE DAILY AT BEDTIME 90 tablet 0  . carvedilol (COREG) 25 MG tablet Take 25 mg by mouth 2 (two) times daily with a meal.    . hydrOXYzine (ATARAX/VISTARIL) 25 MG tablet Take 1 tablet (25 mg total) by mouth 3 (three) times daily as needed for itching. 30 tablet 1  . JANUVIA 25 MG tablet TAKE ONE TABLET BY  MOUTH ONCE DAILY 90 tablet 0  . loperamide (IMODIUM A-D) 2 MG tablet Take 2 mg by mouth 4 (four) times daily as needed for diarrhea or loose stools.    . ondansetron (ZOFRAN) 4 MG tablet Take 1 tablet (4 mg total) by mouth every 6 (six) hours. (Patient taking differently: Take 4 mg by mouth every 6 (six) hours as needed for nausea or vomiting. ) 30 tablet 0  . ranitidine (ZANTAC) 150 MG tablet Take 150 mg by mouth daily as needed for heartburn.    . sevelamer carbonate (RENVELA) 800 MG tablet Take 1,600 mg by mouth 3 (three) times daily with meals.     . simvastatin (ZOCOR) 20 MG tablet TAKE ONE TABLET BY MOUTH ONCE DAILY 90 tablet 0  . sodium chloride (OCEAN) 0.65 % SOLN nasal spray Place 1 spray into both nostrils as needed for congestion. 104 mL 0  . tadalafil (CIALIS) 20 MG tablet Take 1 tablet (20 mg total) by mouth daily as needed for erectile dysfunction. 5 tablet 11  . warfarin (COUMADIN) 3 MG tablet Please take as directed by anticoagulation clinic. (Patient taking differently: Take 3-4.5 mg by mouth daily at 6 PM. Takes 4.51m on thu only Takes 332mall other days) 90 tablet 0  . Blood Glucose Monitoring Suppl (ONE TOUCH ULTRA 2) W/DEVICE KIT Use to check blood sugars twice a day Dx E11.9 1 each 0  . docusate sodium (COLACE) 100 MG capsule Take 100 mg by mouth daily.    . Marland Kitchenlucose blood (ONE TOUCH ULTRA TEST) test strip 1 each by Other  route 2 (two) times daily. Use to check blood sugars twice a day Dx E11.9 100 each 3  . Lancets (ONETOUCH ULTRASOFT) lancets 1 each by Other route 2 (two) times daily. Use to help check blood sugars twice a day Dx E11.9 100 each 3    No Known Allergies  Past Medical History  Diagnosis Date  . Glaucoma   . Hypertension   . Hyperlipidemia   . Atrial fibrillation ([place]) 12/30/2012  . CHF (congestive heart failure) ([place]) 12/30/2012  . Diabetic retinopathy ([place]) 12/30/2012  . Morbid obesity ([place]) 12/30/2012  . Pulmonary hypertension ([place]) 12/30/2012  .  Myocardial infarction ([place])     "I've had ~ 3; last one was in ~ 10/2013" (01/30/2014)  . Diabetes mellitus with neuropathy ([place])   . OSA (obstructive sleep apnea) 12/30/2012    "lost weight; no longer needed CPAP; retested said I needed it; didn't followup cause I was feeling fine" (01/30/2014)  . GERD (gastroesophageal reflux disease)   . Stroke [facility]) ~ 2005; ~ 2005    "they were mild; I didn't even notice I'd had them"; denies residual on 01/30/2014  . History of gout     "right big toe"  . Arthritis     "all over"  . Chronic lower back pain   . ESRD (end stage renal disease) on dialysis ([place]) started in 2013    "MWF; [facility]; [address]" (01/30/2014)  . Refusal of blood transfusions as patient is Jehovah's Witness   . Anemia due to other cause 07/07/2014  . Allergic rhinitis 07/07/2014  . Hypothyroid     Past Surgical History  Procedure Laterality Date  . Total hip arthroplasty Right 1980  . Av fistula placement Left ~ 10/2013    "forearm"  . Closed reduction hip dislocation Right 1970's    Social History   Social History  . Marital Status: Divorced    Spouse Name: N/A  . Number of Children: 2  . Years of Education: N/A   Occupational History  . Not on file.   Social History Main Topics  . Smoking status: Never Smoker   . Smokeless tobacco: Never Used  . Alcohol Use: 1.5 - 2.0 oz/week    3-4 Standard drinks or equivalent per week     Comment: 01/30/2014 "none in the last 3 weeks; primarily drink w/my friends on the weekends"  . Drug Use: No  . Sexual Activity: Not Currently   Other Topics Concern  . Not on file   Social History Narrative    Family History  Problem Relation Age of Onset  . Arthritis Other   . Heart disease Other   . Hyperlipidemia Other   . Stroke Other   . Hypertension Other   . Kidney disease Other   . Diabetes Other     ROS:  no fevers or chills, productive cough, hemoptysis, dysphasia, odynophagia, melena, hematochezia,  dysuria, hematuria, rash, seizure activity, orthopnea, PND, pedal edema, claudication. Remaining systems are negative.  Physical Exam:   Blood pressure 113/63, pulse 64, temperature 97.7 F (36.5 C), temperature source Oral, resp. rate 13, height _0  (1.753 m), weight 112.8 kg (248 lb 10.9 oz), SpO2 99 %.  General:  Well developed/well nourished in NAD Skin warm/dry; Some erythema right lower extremity from fall. Patient not depressed No peripheral clubbing Back-normal HEENT-normal/normal eyelids Neck supple/normal carotid upstroke bilaterally; no bruits; no JVD; no thyromegaly chest - CTA/ normal expansion CV - RRR/normal S1 and S2; no murmurs, rubs or gallops;  PMI nondisplaced Abdomen -NT/ND, no HSM, no mass, + bowel sounds, no bruit 2+ femoral pulses, no bruits Ext-1+ edema, no chords, 1+ DP, Erythema right lower extremity. Neuro-grossly nonfocal  ECG 01/04/2015-sinus rhythm, first-degree AV block.  Results for orders placed or performed during the hospital encounter of 01/03/15 (from the past 48 hour(s))  CBG monitoring, ED     Status: Abnormal   Collection Time: 01/03/15  6:51 PM  Result Value Ref Range   Glucose-Capillary 128 (H) 65 - 99 mg/dL  I-stat troponin, ED     Status: None   Collection Time: 01/03/15  7:25 PM  Result Value Ref Range   Troponin i, poc 0.00 0.00 - 0.08 ng/mL   Comment 3            Comment: Due to the release kinetics of cTnI, a negative result within the first hours of the onset of symptoms does not rule out myocardial infarction with certainty. If myocardial infarction is still suspected, repeat the test at appropriate intervals.   I-stat chem 8, ed     Status: Abnormal   Collection Time: 01/03/15  7:27 PM  Result Value Ref Range   Sodium 128 (L) 135 - 145 mmol/L   Potassium 6.4 (HH) 3.5 - 5.1 mmol/L   Chloride 97 (L) 101 - 111 mmol/L   BUN 96 (H) 6 - 20 mg/dL   Creatinine, Ser 10.70 (H) 0.61 - 1.24 mg/dL   Glucose, Bld 150 (H) 65 - 99  mg/dL   Calcium, Ion 1.04 (L) 1.13 - 1.30 mmol/L   TCO2 21 0 - 100 mmol/L   Hemoglobin 11.9 (L) 13.0 - 17.0 g/dL   HCT 35.0 (L) 39.0 - 52.0 %   Comment NOTIFIED PHYSICIAN   Lactic acid, plasma     Status: Abnormal   Collection Time: 01/03/15  7:29 PM  Result Value Ref Range   Lactic Acid, Venous 2.8 (HH) 0.5 - 2.0 mmol/L    Comment: CRITICAL RESULT CALLED TO, READ BACK BY AND VERIFIED WITH: LASHER,R RN _0  BY GRINSTEAD,C 11.21.16   Troponin I     Status: None   Collection Time: 01/03/15  7:29 PM  Result Value Ref Range   Troponin I <0.03 <0.031 ng/mL    Comment:        NO INDICATION OF MYOCARDIAL INJURY.   Protime-INR     Status: Abnormal   Collection Time: 01/03/15  7:29 PM  Result Value Ref Range   Prothrombin Time 33.0 (H) 11.6 - 15.2 seconds   INR 3.31 (H) 0.00 - 1.49  CBC with Differential     Status: Abnormal   Collection Time: 01/03/15  7:58 PM  Result Value Ref Range   WBC 9.7 4.0 - 10.5 K/uL   RBC 3.47 (L) 4.22 - 5.81 MIL/uL   Hemoglobin 10.8 (L) 13.0 - 17.0 g/dL   HCT 33.8 (L) 39.0 - 52.0 %   MCV 97.4 78.0 - 100.0 fL   MCH 31.1 26.0 - 34.0 pg   MCHC 32.0 30.0 - 36.0 g/dL   RDW 16.7 (H) 11.5 - 15.5 %   Platelets 210 150 - 400 K/uL   Neutrophils Relative % 80 %   Neutro Abs 7.7 1.7 - 7.7 K/uL   Lymphocytes Relative 14 %   Lymphs Abs 1.4 0.7 - 4.0 K/uL   Monocytes Relative 5 %   Monocytes Absolute 0.5 0.1 - 1.0 K/uL   Eosinophils Relative 1 %   Eosinophils Absolute 0.1 0.0 - 0.7 K/uL  Basophils Relative 0 %   Basophils Absolute 0.0 0.0 - 0.1 K/uL  Comprehensive metabolic panel     Status: Abnormal   Collection Time: 01/03/15  7:58 PM  Result Value Ref Range   Sodium 130 (L) 135 - 145 mmol/L   Potassium 7.0 (HH) 3.5 - 5.1 mmol/L    Comment: CRITICAL RESULT CALLED TO, READ BACK BY AND VERIFIED WITH: NO VISIBLE HEMOLYSIS LULIS,A RN _0  BY GRINSTEAD,C 11.21.16    Chloride 94 (L) 101 - 111 mmol/L   CO2 19 (L) 22 - 32 mmol/L   Glucose, Bld 150 (H) 65 -  99 mg/dL   BUN 91 (H) 6 - 20 mg/dL   Creatinine, Ser 11.10 (H) 0.61 - 1.24 mg/dL   Calcium 8.8 (L) 8.9 - 10.3 mg/dL   Total Protein 6.5 6.5 - 8.1 g/dL   Albumin 2.9 (L) 3.5 - 5.0 g/dL   AST 51 (H) 15 - 41 U/L   ALT 56 17 - 63 U/L   Alkaline Phosphatase 161 (H) 38 - 126 U/L   Total Bilirubin 0.7 0.3 - 1.2 mg/dL   GFR calc non Af Amer 4 (L) >60 mL/min   GFR calc Af Amer 5 (L) >60 mL/min    Comment: (NOTE) The eGFR has been calculated using the CKD EPI equation. This calculation has not been validated in all clinical situations. eGFR's persistently <60 mL/min signify possible Chronic Kidney Disease.    Anion gap 17 (H) 5 - 15  Troponin I     Status: None   Collection Time: 01/03/15  7:58 PM  Result Value Ref Range   Troponin I <0.03 <0.031 ng/mL    Comment:        NO INDICATION OF MYOCARDIAL INJURY.   I-Stat arterial blood gas, ED     Status: Abnormal   Collection Time: 01/03/15  7:58 PM  Result Value Ref Range   pH, Arterial 7.266 (L) 7.350 - 7.450   pCO2 arterial 47.8 (H) 35.0 - 45.0 mmHg   pO2, Arterial 338.0 (H) 80.0 - 100.0 mmHg   Bicarbonate 21.7 20.0 - 24.0 mEq/L   TCO2 23 0 - 100 mmol/L   O2 Saturation 100.0 %   Acid-base deficit 5.0 (H) 0.0 - 2.0 mmol/L   Patient temperature HIDE    Collection site RADIAL, ALLEN'S TEST ACCEPTABLE    Drawn by RT    Sample type ARTERIAL   Triglycerides     Status: None   Collection Time: 01/03/15  7:59 PM  Result Value Ref Range   Triglycerides 126 <150 mg/dL  Urinalysis, Routine w reflex microscopic (not at [facility])     Status: Abnormal   Collection Time: 01/03/15  8:11 PM  Result Value Ref Range   Color, Urine AMBER (A) YELLOW    Comment: BIOCHEMICALS MAY BE AFFECTED BY COLOR   APPearance CLOUDY (A) CLEAR   Specific Gravity, Urine 1.018 1.005 - 1.030   pH 5.0 5.0 - 8.0   Glucose, UA NEGATIVE NEGATIVE mg/dL   Hgb urine dipstick MODERATE (A) NEGATIVE   Bilirubin Urine SMALL (A) NEGATIVE   Ketones, ur NEGATIVE NEGATIVE mg/dL    Protein, ur 30 (A) NEGATIVE mg/dL   Nitrite NEGATIVE NEGATIVE   Leukocytes, UA SMALL (A) NEGATIVE  Urine microscopic-add on     Status: Abnormal   Collection Time: 01/03/15  8:11 PM  Result Value Ref Range   Squamous Epithelial / LPF 0-5 (A) NONE SEEN    Comment: Please note change in reference range.  WBC, UA 0-5 0 - 5 WBC/hpf    Comment: Please note change in reference range.   RBC / HPF 0-5 0 - 5 RBC/hpf    Comment: Please note change in reference range.   Bacteria, UA RARE (A) NONE SEEN    Comment: Please note change in reference range.   Urine-Other AMORPHOUS URATES/PHOSPHATES   CBG monitoring, ED     Status: Abnormal   Collection Time: 01/03/15  9:05 PM  Result Value Ref Range   Glucose-Capillary 110 (H) 65 - 99 mg/dL  Basic metabolic panel     Status: Abnormal   Collection Time: 01/03/15  9:43 PM  Result Value Ref Range   Sodium 130 (L) 135 - 145 mmol/L   Potassium >7.5 (HH) 3.5 - 5.1 mmol/L    Comment: NO VISIBLE HEMOLYSIS G [place],RN [phone] [place] CRITICAL RESULT CALLED TO, READ BACK BY AND VERIFIED WITH:    Chloride 93 (L) 101 - 111 mmol/L   CO2 20 (L) 22 - 32 mmol/L   Glucose, Bld 138 (H) 65 - 99 mg/dL   BUN 93 (H) 6 - 20 mg/dL   Creatinine, Ser 11.33 (H) 0.61 - 1.24 mg/dL   Calcium 7.3 (L) 8.9 - 10.3 mg/dL   GFR calc non Af Amer 4 (L) >60 mL/min   GFR calc Af Amer 5 (L) >60 mL/min    Comment: (NOTE) The eGFR has been calculated using the CKD EPI equation. This calculation has not been validated in all clinical situations. eGFR's persistently <60 mL/min signify possible Chronic Kidney Disease. CORRECTED ON 11/22 AT 4401: PREVIOUSLY REPORTED AS 5    Anion gap 17 (H) 5 - 15  Glucose, capillary     Status: Abnormal   Collection Time: 01/03/15 10:36 PM  Result Value Ref Range   Glucose-Capillary 111 (H) 65 - 99 mg/dL  Lactic acid, plasma     Status: None   Collection Time: 01/03/15 11:06 PM  Result Value Ref Range   Lactic Acid, Venous 1.2 0.5 - 2.0  mmol/L  MRSA PCR Screening     Status: None   Collection Time: 01/03/15 11:23 PM  Result Value Ref Range   MRSA by PCR NEGATIVE NEGATIVE    Comment:        The GeneXpert MRSA Assay (FDA approved for NASAL specimens only), is one component of a comprehensive MRSA colonization surveillance program. It is not intended to diagnose MRSA infection nor to guide or monitor treatment for MRSA infections.   Lactic acid, plasma     Status: None   Collection Time: 01/04/15  2:20 AM  Result Value Ref Range   Lactic Acid, Venous 0.9 0.5 - 2.0 mmol/L  Troponin I     Status: Abnormal   Collection Time: 01/04/15  2:20 AM  Result Value Ref Range   Troponin I 0.06 (H) <0.031 ng/mL    Comment:        PERSISTENTLY INCREASED TROPONIN VALUES IN THE RANGE OF 0.04-0.49 ng/mL CAN BE SEEN IN:       -UNSTABLE ANGINA       -CONGESTIVE HEART FAILURE       -MYOCARDITIS       -CHEST TRAUMA       -ARRYHTHMIAS       -LATE PRESENTING MYOCARDIAL INFARCTION       -COPD   CLINICAL FOLLOW-UP RECOMMENDED.   Basic metabolic panel     Status: Abnormal   Collection Time: 01/04/15  2:20 AM  Result Value Ref  Range   Sodium 135 135 - 145 mmol/L   Potassium 3.0 (L) 3.5 - 5.1 mmol/L    Comment: DELTA CHECK NOTED   Chloride 95 (L) 101 - 111 mmol/L   CO2 27 22 - 32 mmol/L   Glucose, Bld 98 65 - 99 mg/dL   BUN 26 (H) 6 - 20 mg/dL   Creatinine, Ser 4.18 (H) 0.61 - 1.24 mg/dL    Comment: DELTA CHECK NOTED   Calcium 7.7 (L) 8.9 - 10.3 mg/dL   GFR calc non Af Amer 14 (L) >60 mL/min   GFR calc Af Amer 16 (L) >60 mL/min    Comment: (NOTE) The eGFR has been calculated using the CKD EPI equation. This calculation has not been validated in all clinical situations. eGFR's persistently <60 mL/min signify possible Chronic Kidney Disease.    Anion gap 13 5 - 15  Glucose, capillary     Status: None   Collection Time: 01/04/15  4:06 AM  Result Value Ref Range   Glucose-Capillary 82 65 - 99 mg/dL  Basic metabolic panel      Status: Abnormal   Collection Time: 01/04/15  5:28 AM  Result Value Ref Range   Sodium 136 135 - 145 mmol/L   Potassium 4.1 3.5 - 5.1 mmol/L    Comment: DELTA CHECK NOTED NO VISIBLE HEMOLYSIS    Chloride 94 (L) 101 - 111 mmol/L   CO2 29 22 - 32 mmol/L   Glucose, Bld 97 65 - 99 mg/dL   BUN 32 (H) 6 - 20 mg/dL   Creatinine, Ser 5.78 (H) 0.61 - 1.24 mg/dL   Calcium 7.5 (L) 8.9 - 10.3 mg/dL   GFR calc non Af Amer 10 (L) >60 mL/min   GFR calc Af Amer 11 (L) >60 mL/min    Comment: (NOTE) The eGFR has been calculated using the CKD EPI equation. This calculation has not been validated in all clinical situations. eGFR's persistently <60 mL/min signify possible Chronic Kidney Disease.    Anion gap 13 5 - 15  CBC     Status: Abnormal   Collection Time: 01/04/15  5:29 AM  Result Value Ref Range   WBC 11.0 (H) 4.0 - 10.5 K/uL   RBC 3.34 (L) 4.22 - 5.81 MIL/uL   Hemoglobin 10.6 (L) 13.0 - 17.0 g/dL   HCT 31.9 (L) 39.0 - 52.0 %   MCV 95.5 78.0 - 100.0 fL   MCH 31.7 26.0 - 34.0 pg   MCHC 33.2 30.0 - 36.0 g/dL   RDW 16.4 (H) 11.5 - 15.5 %   Platelets 225 150 - 400 K/uL  Magnesium     Status: None   Collection Time: 01/04/15  5:29 AM  Result Value Ref Range   Magnesium 1.9 1.7 - 2.4 mg/dL  Phosphorus     Status: Abnormal   Collection Time: 01/04/15  5:29 AM  Result Value Ref Range   Phosphorus 6.4 (H) 2.5 - 4.6 mg/dL  Protime-INR     Status: Abnormal   Collection Time: 01/04/15  5:29 AM  Result Value Ref Range   Prothrombin Time 31.3 (H) 11.6 - 15.2 seconds   INR 3.09 (H) 0.00 - 1.49  Troponin I (q 6hr x 3)     Status: Abnormal   Collection Time: 01/04/15  7:28 AM  Result Value Ref Range   Troponin I 0.06 (H) <0.031 ng/mL    Comment:        PERSISTENTLY INCREASED TROPONIN VALUES IN THE RANGE OF 0.04-0.49 ng/mL  CAN BE SEEN IN:       -UNSTABLE ANGINA       -CONGESTIVE HEART FAILURE       -MYOCARDITIS       -CHEST TRAUMA       -ARRYHTHMIAS       -LATE PRESENTING MYOCARDIAL  INFARCTION       -COPD   CLINICAL FOLLOW-UP RECOMMENDED.   Glucose, capillary     Status: None   Collection Time: 01/04/15  7:47 AM  Result Value Ref Range   Glucose-Capillary 74 65 - 99 mg/dL  Glucose, capillary     Status: Abnormal   Collection Time: 01/04/15  1:05 PM  Result Value Ref Range   Glucose-Capillary 140 (H) 65 - 99 mg/dL  Glucose, capillary     Status: Abnormal   Collection Time: 01/04/15  4:10 PM  Result Value Ref Range   Glucose-Capillary 239 (H) 65 - 99 mg/dL  Glucose, capillary     Status: Abnormal   Collection Time: 01/04/15  9:09 PM  Result Value Ref Range   Glucose-Capillary 164 (H) 65 - 99 mg/dL   Comment 1 Notify RN    Comment 2 Document in Chart   Protime-INR     Status: Abnormal   Collection Time: 01/05/15  3:12 AM  Result Value Ref Range   Prothrombin Time 32.8 (H) 11.6 - 15.2 seconds   INR 3.29 (H) 0.00 - 1.49  Renal function panel     Status: Abnormal   Collection Time: 01/05/15  3:12 AM  Result Value Ref Range   Sodium 132 (L) 135 - 145 mmol/L   Potassium 5.3 (H) 3.5 - 5.1 mmol/L    Comment: DELTA CHECK NOTED NO VISIBLE HEMOLYSIS    Chloride 91 (L) 101 - 111 mmol/L   CO2 28 22 - 32 mmol/L   Glucose, Bld 130 (H) 65 - 99 mg/dL   BUN 53 (H) 6 - 20 mg/dL   Creatinine, Ser 8.18 (H) 0.61 - 1.24 mg/dL   Calcium 6.2 (LL) 8.9 - 10.3 mg/dL    Comment: CRITICAL RESULT CALLED TO, READ BACK BY AND VERIFIED WITH: Corlis Leak [phone] [facility]    Phosphorus 6.6 (H) 2.5 - 4.6 mg/dL   Albumin 2.9 (L) 3.5 - 5.0 g/dL   GFR calc non Af Amer 6 (L) >60 mL/min   GFR calc Af Amer 7 (L) >60 mL/min    Comment: (NOTE) The eGFR has been calculated using the CKD EPI equation. This calculation has not been validated in all clinical situations. eGFR's persistently <60 mL/min signify possible Chronic Kidney Disease.    Anion gap 13 5 - 15  CBC     Status: Abnormal   Collection Time: 01/05/15  3:12 AM  Result Value Ref Range   WBC 12.0 (H) 4.0 - 10.5 K/uL     RBC 3.21 (L) 4.22 - 5.81 MIL/uL   Hemoglobin 9.8 (L) 13.0 - 17.0 g/dL   HCT 31.0 (L) 39.0 - 52.0 %   MCV 96.6 78.0 - 100.0 fL   MCH 30.5 26.0 - 34.0 pg   MCHC 31.6 30.0 - 36.0 g/dL   RDW 16.8 (H) 11.5 - 15.5 %   Platelets 235 150 - 400 K/uL    Dg Chest Port 1 View  01/04/2015  CLINICAL DATA:  Respiratory failure EXAM: PORTABLE CHEST 1 VIEW COMPARISON:  January 03, 2015 FINDINGS: Endotracheal tube and nasogastric tube have been removed. No pneumothorax. Patchy opacity in the lingula and perihilar regions remains without  change. No new opacity. Heart is upper normal in size with pulmonary vascularity within normal limits. No adenopathy. IMPRESSION: No pneumothorax. Patchy airspace disease in the lingula and bilateral perihilar regions remains stable. No new opacity. No change in cardiac silhouette. Electronically Signed   By: Lowella Grip III M.D.   On: 01/04/2015 07:06   Dg Chest Portable 1 View  01/03/2015  CLINICAL DATA:  Endotracheal tube placement EXAM: PORTABLE CHEST 1 VIEW COMPARISON:  11/08/2014 FINDINGS: Endotracheal tube tip is 3.6 cm above the carina, well positioned. Nasogastric tube extends into the stomach. Moderate to prominent enlargement of the cardiopericardial silhouette observed. There has been some clearing of the interstitial accentuation although patchy airspace opacities in the lingula and perihilar regions are again observed. Low lung volumes are present, causing crowding of the pulmonary vasculature. No pleural effusion identified. IMPRESSION: 1. Well-positioned endotracheal tube, about 3.6 cm above the carina. Nasogastric tube enters the stomach. 2. Slightly improved aeration although there is some bandlike opacities in the lingula and perihilar regions persisting. 3. Low lung volumes. Electronically Signed   By: Van Clines M.D.   On: 01/03/2015 20:31    Assessment/Plan 1 status post cardiac arrest-patient had an episode of cardiac arrest in July and an  echocardiogram at that time showed ejection fraction of 20% with possible takotsubo. Note he had missed dialysis but potassium at that time was 5.2. He has had another event after missing dialysis and potassium 7. Each event occurred following dialysis and potassium certainly could have played a role in each event. We will repeat echocardiogram to reassess LV function. I also think he should undergo cardiac catheterization to exclude significant coronary disease. The risks and benefits were discussed and we will plan to proceed Friday if INR<1.8. I did discuss the patient with Dr. Caryl Comes. If LV function improved then ICD would not be indicated as potassium could have contributed to event. If LV function decreased with await results of cardiac catheterization and if patient requires revascularization would then repeat echocardiogram 3 months later. If ejection fraction less than 35% could consider subcutaneous ICD. We will hold Coumadin in anticipation of catheterization on Friday. Add aspirin 81 mg daily. 2 paroxysmal atrial fibrillation-patient remains in sinus rhythm. Continue amiodarone. Will resume Coumadin once all procedures complete.  3 end-stage renal disease-management per nephrology. 4 Hyperlipidemia-discontinue Zocor given potential interaction with amiodarone. Add Lipitor 80 mg daily. [place]  Kirk Ruths MD 01/05/2015, 11:32 AM

## 2015-01-05 NOTE — Progress Notes (Signed)
Nutrition Education Note  RD consulted for dietEducation. Pt requesting information regarding potassium content of foods. Provided Choose-A-Meal Booklet to patient/family. Reviewed food groups and provided written recommended serving sizes specifically determined for patient's current nutritional status. Provide and reviewed list of low potassium foods that are good and high sodium foods to avoid. RD name and contact information provided.   Encouraged pt to discuss specific diet questions/concerns with RD at HD outpatient facility. Teach back method used.  Expect very good compliance.  Body mass index is 35.54 kg/(m^2). Pt meets criteria for Obesity based on current BMI.  Current diet order is Renal/Carb Modified, patient is consuming approximately 100% of meals at this time. Labs and medications reviewed. No further nutrition interventions warranted at this time. If additional nutrition issues arise, please re-consult RD.  Scarlette Ar RD, LDN Inpatient Clinical Dietitian Pager: [phone] After Hours Pager: [phone]

## 2015-01-05 NOTE — Progress Notes (Signed)
PROGRESS NOTE  Daniel Whitney [id number] DOB: 11-26-1955 DOA: 01/03/2015 PCP: No primary care provider on file.  HPI: 59 y.o. male who is [name], with hx ESRD (M/W/F). He was scheduled for dialysis Mon 11/21; however, he felt bad so he rescheduled for Tues 11/22 (last session Fri 11/18). He was brought to [facility] ED 11/21 after he collapsed at home and required TC pacing en route for HR 40s In ED, found to have hyperkalemia and had 10 min PEA arrest during which he was intubated. He was initially admitted to the ICU service, and transferred to telemetry and [place] took over on 11/23  Subjective / 24 H Interval events - Seen in dialysis, patient is without complaints, denies any chest pain, denies any palpitations, denies any abdominal pain, nausea vomiting or diarrhea  Assessment/Plan: Active Problems:   Diabetes mellitus with neuropathy (HCC)   Hypertension   ESRD (end stage renal disease) on dialysis [facility])   Cardiac arrest ([place])   Hyperkalemia   Acute on chronic combined systolic and diastolic heart failure ([place])   PEA Cardiac arrest - This is his second episode this year, was hospitalized in Sep 23, 2014 when he coded, he was resuscitated then cooled. Likely the setting of hyperkalemia as well as combined systolic and diastolic heart failure - Patient with recently diagnosed systolic heart failure, ejection fraction in 2022/09/23 was 20% - Consult cardiology today. He is being followed by Dr. Debara Pickett for his A. fib  Acute on chronic combined systolic and diastolic heart failure - Monitor patient telemetry, cardiology consult today - there is a reported history of congestive heart failure, prior to him moving from [place] into [place] area and a repeat echo done here in March 2015 showed normal EF of 55-60%, decreased to 20% in 09-23-22. I'm not sure what his ejection fraction was when he was up in [place]  Atrial fibrillation, paroxysmal  - patient's CHA2DS2-VASc Score for Stroke Risk  is 3 - Patient on amiodarone - On Coumadin  Pulmonary hypertension / OSA - non compliant with his CPAP  DM - start sliding scale insulin - Hemoglobin A1c less than 7, showing control  End-stage renal disease - Patient being followed by nephrology, underwent hemodialysis today  HTN - Holding blood pressure medications in the setting of hypotension initially - Holding Coreg  HLD  continue simvastatin  Hyperkalemia - Likely in the setting of end-stage renal disease, dialysis per nephrology  Hypocalcemia - HD  Chronic anemia - In the setting of end-stage renal disease - He is a Sales promotion account executive Witness, 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  Hyponatremia - HD   Diet: Diet renal/carb modified with fluid restriction Diet-HS Snack?: Nothing; Room service appropriate?: Yes; Fluid consistency:: Thin; Fluid restriction:: 2000 mL Fluid Fluids: none DVT Prophylaxis: Coumadin  Code Status: Full Code Family Communication: no family bedside  Disposition Plan: remain inpatient  Barriers to discharge: cardiology evaluation  Consultants:  Cardiology   Nephrology   Procedures:  None    Antibiotics  Anti-infectives    None       Studies  Dg Chest Port 1 View  01/04/2015  CLINICAL DATA:  Respiratory failure EXAM: PORTABLE CHEST 1 VIEW COMPARISON:  January 03, 2015 FINDINGS: Endotracheal tube and nasogastric tube have been removed. No pneumothorax. Patchy opacity in the lingula and perihilar regions remains without change. No new opacity. Heart is upper normal in size with pulmonary vascularity within normal limits. No adenopathy. IMPRESSION: No pneumothorax. Patchy airspace disease in the lingula and bilateral perihilar  regions remains stable. No new opacity. No change in cardiac silhouette. Electronically Signed   By: Lowella Grip III M.D.   On: 01/04/2015 07:06   Dg Chest Portable 1 View  01/03/2015  CLINICAL DATA:  Endotracheal tube placement EXAM: PORTABLE CHEST 1 VIEW COMPARISON:   11/08/2014 FINDINGS: Endotracheal tube tip is 3.6 cm above the carina, well positioned. Nasogastric tube extends into the stomach. Moderate to prominent enlargement of the cardiopericardial silhouette observed. There has been some clearing of the interstitial accentuation although patchy airspace opacities in the lingula and perihilar regions are again observed. Low lung volumes are present, causing crowding of the pulmonary vasculature. No pleural effusion identified. IMPRESSION: 1. Well-positioned endotracheal tube, about 3.6 cm above the carina. Nasogastric tube enters the stomach. 2. Slightly improved aeration although there is some bandlike opacities in the lingula and perihilar regions persisting. 3. Low lung volumes. Electronically Signed   By: Van Clines M.D.   On: 01/03/2015 20:31    Objective  Filed Vitals:   01/04/15 1607 01/04/15 2042 01/05/15 0034 01/05/15 0442  BP: 104/45 96/52 93/50  110/62  Pulse: 60 56 61 62  Temp: 98.4 F (36.9 C) 98.2 F (36.8 C) 98.4 F (36.9 C) 97.4 F (36.3 C)  TempSrc: Oral Oral Oral Oral  Resp: 18 18 18 17   Height:      Weight:    108.773 kg (239 lb 12.8 oz)  SpO2: 100% 93% 100% 99%    Intake/Output Summary (Last 24 hours) at 01/05/15 0706 Last data filed at 01/05/15 0600  Gross per 24 hour  Intake    480 ml  Output      0 ml  Net    480 ml   Filed Weights   01/03/15 2230 01/04/15 0240 01/05/15 0442  Weight: 113.2 kg (249 lb 9 oz) 110.2 kg (242 lb 15.2 oz) 108.773 kg (239 lb 12.8 oz)    Exam:  GENERAL: NAD  HEENT: head NCAT, no scleral icterus.   NECK: Supple. No LAD  LUNGS: Clear to auscultation. No wheezing or crackles  HEART: Regular rate and rhythm without murmur. 2+ pulses, no JVD, no peripheral edema. Bradycardic  ABDOMEN: Soft, non-distended, non-tender. Positive bowel sounds.  EXTREMITIES: Without any cyanosis or clubbing. Good muscle tone  NEUROLOGIC: Alert and oriented x3. Cranial nerves II through XII are  grossly intact. Strength 5/5 in all 4.  PSYCHIATRIC: Normal mood and affect   Data Reviewed: Basic Metabolic Panel:  Recent Labs Lab 01/03/15 1958 01/03/15 2143 01/04/15 0220 01/04/15 0528 01/04/15 0529 01/05/15 0312  NA 130* 130* 135 136  --  132*  K 7.0* >7.5* 3.0* 4.1  --  5.3*  CL 94* 93* 95* 94*  --  91*  CO2 19* 20* 27 29  --  28  GLUCOSE 150* 138* 98 97  --  130*  BUN 91* 93* 26* 32*  --  53*  CREATININE 11.10* 11.33* 4.18* 5.78*  --  8.18*  CALCIUM 8.8* 7.3* 7.7* 7.5*  --  6.2*  MG  --   --   --   --  1.9  --   PHOS  --   --   --   --  6.4* 6.6*   Liver Function Tests:  Recent Labs Lab 01/03/15 1958 01/05/15 0312  AST 51*  --   ALT 56  --   ALKPHOS 161*  --   BILITOT 0.7  --   PROT 6.5  --   ALBUMIN 2.9* 2.9*  CBC:  Recent Labs Lab 01/03/15 1927 01/03/15 1958 01/04/15 0529 01/05/15 0312  WBC  --  9.7 11.0* 12.0*  NEUTROABS  --  7.7  --   --   HGB 11.9* 10.8* 10.6* 9.8*  HCT 35.0* 33.8* 31.9* 31.0*  MCV  --  97.4 95.5 96.6  PLT  --  210 225 235   Cardiac Enzymes:  Recent Labs Lab 01/03/15 1929 01/03/15 1958 01/04/15 0220 01/04/15 0728  TROPONINI <0.03 <0.03 0.06* 0.06*   ProBNP (last 3 results)  Recent Labs  01/29/14 0748  PROBNP 7824.0*    CBG:  Recent Labs Lab 01/04/15 0406 01/04/15 0747 01/04/15 1305 01/04/15 1610 01/04/15 2109  GLUCAP 82 74 140* 239* 164*    Recent Results (from the past 240 hour(s))  MRSA PCR Screening     Status: None   Collection Time: 01/03/15 11:23 PM  Result Value Ref Range Status   MRSA by PCR NEGATIVE NEGATIVE Final    Comment:        The GeneXpert MRSA Assay (FDA approved for NASAL specimens only), is one component of a comprehensive MRSA colonization surveillance program. It is not intended to diagnose MRSA infection nor to guide or monitor treatment for MRSA infections.      Scheduled Meds: . amiodarone  200 mg Per Tube Daily  . collagenase   Topical Daily  .  doxercalciferol  5 mcg Intravenous Q M,W,F-HD  . predniSONE  20 mg Oral Q breakfast  . sevelamer carbonate  2,400 mg Oral TID WC  . simvastatin  20 mg Per Tube QHS  . Warfarin - Pharmacist Dosing Inpatient   Does not apply q1800   Continuous Infusions: . albuterol      Marzetta Board, MD [facility] Pager [phone]. If 7 PM - 7 AM, please contact night-coverage at [email or web address], password [facility] 01/05/2015, 7:06 AM  LOS: 2 days

## 2015-01-05 NOTE — Progress Notes (Signed)
Critical value received Calcium 6.2. Edd Arbour, RN notified

## 2015-01-05 NOTE — Progress Notes (Signed)
*  PRELIMINARY RESULTS* Echocardiogram 2D Echocardiogram has been performed.  Leavy Cella 01/05/2015, 3:45 PM

## 2015-01-06 LAB — PROTIME-INR
INR: 2.07 — AB (ref 0.00–1.49)
Prothrombin Time: 23.2 seconds — ABNORMAL HIGH (ref 11.6–15.2)

## 2015-01-06 LAB — CBC
HCT: 32.5 % — ABNORMAL LOW (ref 39.0–52.0)
Hemoglobin: 10.5 g/dL — ABNORMAL LOW (ref 13.0–17.0)
MCH: 31.3 pg (ref 26.0–34.0)
MCHC: 32.3 g/dL (ref 30.0–36.0)
MCV: 97 fL (ref 78.0–100.0)
PLATELETS: 248 10*3/uL (ref 150–400)
RBC: 3.35 MIL/uL — AB (ref 4.22–5.81)
RDW: 16.6 % — ABNORMAL HIGH (ref 11.5–15.5)
WBC: 12.9 10*3/uL — AB (ref 4.0–10.5)

## 2015-01-06 LAB — RENAL FUNCTION PANEL
Albumin: 3.1 g/dL — ABNORMAL LOW (ref 3.5–5.0)
Anion gap: 11 (ref 5–15)
BUN: 34 mg/dL — ABNORMAL HIGH (ref 6–20)
CALCIUM: 7.1 mg/dL — AB (ref 8.9–10.3)
CO2: 28 mmol/L (ref 22–32)
CREATININE: 5.49 mg/dL — AB (ref 0.61–1.24)
Chloride: 94 mmol/L — ABNORMAL LOW (ref 101–111)
GFR, EST AFRICAN AMERICAN: 12 mL/min — AB (ref >60)
GFR, EST NON AFRICAN AMERICAN: 10 mL/min — AB (ref >60)
Glucose, Bld: 188 mg/dL — ABNORMAL HIGH (ref 65–99)
Phosphorus: 3.6 mg/dL (ref 2.5–4.6)
Potassium: 3.9 mmol/L (ref 3.5–5.1)
SODIUM: 133 mmol/L — AB (ref 135–145)

## 2015-01-06 LAB — GLUCOSE, CAPILLARY
GLUCOSE-CAPILLARY: 134 mg/dL — AB (ref 65–99)
GLUCOSE-CAPILLARY: 252 mg/dL — AB (ref 65–99)
Glucose-Capillary: 109 mg/dL — ABNORMAL HIGH (ref 65–99)

## 2015-01-06 MED ORDER — SODIUM CHLORIDE 0.9 % IJ SOLN
3.0000 mL | Freq: Two times a day (BID) | INTRAMUSCULAR | Status: DC
  Administered 2015-01-06 – 2015-01-07 (×2): 3 mL via INTRAVENOUS

## 2015-01-06 MED ORDER — SODIUM CHLORIDE 0.9 % IV SOLN
INTRAVENOUS | Status: DC
  Administered 2015-01-07: 07:00:00 via INTRAVENOUS

## 2015-01-06 MED ORDER — SODIUM CHLORIDE 0.9 % IJ SOLN: 3.0000 mL | INTRAMUSCULAR | Status: DC | PRN

## 2015-01-06 MED ORDER — ASPIRIN 81 MG PO CHEW
81.0000 mg | CHEWABLE_TABLET | ORAL | Status: AC
  Administered 2015-01-07: 81 mg via ORAL
  Filled 2015-01-06: qty 1

## 2015-01-06 MED ORDER — VITAMIN K1 10 MG/ML IJ SOLN
1.0000 mg | Freq: Once | INTRAVENOUS | Status: AC
  Administered 2015-01-06: 1 mg via INTRAVENOUS
  Filled 2015-01-06: qty 0.1

## 2015-01-06 MED ORDER — SODIUM CHLORIDE 0.9 % IV SOLN: 250.0000 mL | INTRAVENOUS | Status: DC | PRN

## 2015-01-06 NOTE — Progress Notes (Signed)
Patient Name: Daniel Whitney Date of Encounter: 01/06/2015     Active Problems:   Diabetes mellitus with neuropathy ([place])   Hypertension   ESRD (end stage renal disease) on dialysis [facility])   Cardiac arrest ([place])   Hyperkalemia   Acute on chronic combined systolic and diastolic heart failure ([place])    SUBJECTIVE  The patient denies any chest pain or dyspnea overnight. Residual chest soreness from CPR. Rhythm remains NSR. Warfarin on hold for cath tomorrow. INR>2 this am.  CURRENT MEDS . amiodarone  200 mg Per Tube Daily  . aspirin  81 mg Oral Daily  . atorvastatin  80 mg Oral q1800  . collagenase   Topical Daily  . doxercalciferol  5 mcg Intravenous Q M,W,F-HD  . insulin aspart  0-9 Units Subcutaneous TID WC  . predniSONE  20 mg Oral Q breakfast  . sevelamer carbonate  2,400 mg Oral TID WC    OBJECTIVE  Filed Vitals:   01/05/15 1258 01/05/15 2033 01/05/15 2336 01/06/15 0442  BP: 106/89 94/51 93/51  115/59  Pulse: 116 64 72 68  Temp:  99.2 F (37.3 C) 99.3 F (37.4 C) 98.4 F (36.9 C)  TempSrc:  Oral Oral Oral  Resp:  18 18 20   Height:      Weight:    243 lb 3.2 oz (110.315 kg)  SpO2:  96% 98% 99%    Intake/Output Summary (Last 24 hours) at 01/06/15 0723 Last data filed at 01/06/15 0500  Gross per 24 hour  Intake   1255 ml  Output   3000 ml  Net  -1745 ml   Filed Weights   01/05/15 0710 01/05/15 1130 01/06/15 0442  Weight: 248 lb 10.9 oz (112.8 kg) 240 lb 11.9 oz (109.2 kg) 243 lb 3.2 oz (110.315 kg)    PHYSICAL EXAM  General: Pleasant, NAD. Neuro: Alert and oriented X 3. Moves all extremities spontaneously. Psych: Normal affect. HEENT:  Normal  Neck: Supple without bruits or JVD. Lungs:  Resp regular and unlabored, CTA. Heart: RRR no s3, s4, or murmurs. Abdomen: Soft, non-tender, non-distended, BS + x 4.  Extremities: Mild edema right foot. IV catheter in right groin.   Accessory Clinical Findings  CBC  Recent Labs  01/03/15 1958   01/05/15 0312 01/06/15 0213  WBC 9.7  < > 12.0* 12.9*  NEUTROABS 7.7  --   --   --   HGB 10.8*  < > 9.8* 10.5*  HCT 33.8*  < > 31.0* 32.5*  MCV 97.4  < > 96.6 97.0  PLT 210  < > 235 248  < > = values in this interval not displayed. Basic Metabolic Panel  Recent Labs  01/04/15 0529 01/05/15 0312 01/06/15 0213  NA  --  132* 133*  K  --  5.3* 3.9  CL  --  91* 94*  CO2  --  28 28  GLUCOSE  --  130* 188*  BUN  --  53* 34*  CREATININE  --  8.18* 5.49*  CALCIUM  --  6.2* 7.1*  MG 1.9  --   --   PHOS 6.4* 6.6* 3.6   Liver Function Tests  Recent Labs  01/03/15 1958 01/05/15 0312 01/06/15 0213  AST 51*  --   --   ALT 56  --   --   ALKPHOS 161*  --   --   BILITOT 0.7  --   --   PROT 6.5  --   --   ALBUMIN 2.9*  2.9* 3.1*   No results for input(s): LIPASE, AMYLASE in the last 72 hours. Cardiac Enzymes  Recent Labs  01/03/15 1958 01/04/15 0220 01/04/15 0728  TROPONINI <0.03 0.06* 0.06*   BNP Invalid input(s): POCBNP D-Dimer No results for input(s): DDIMER in the last 72 hours. Hemoglobin A1C No results for input(s): HGBA1C in the last 72 hours. Fasting Lipid Panel  Recent Labs  01/03/15 1959  TRIG 126   Thyroid Function Tests No results for input(s): TSH, T4TOTAL, T3FREE, THYROIDAB in the last 72 hours.  Invalid input(s): FREET3  TELE  NSR  ECG    Radiology/Studies  Dg Chest 2 View  01/05/2015  CLINICAL DATA:  Right-sided chest pain. EXAM: CHEST  2 VIEW COMPARISON:  01/04/2015. FINDINGS: Mediastinum and hilar structures are normal. Cardiomegaly with pulmonary vascular prominence and interstitial prominence consistent with congestive heart failure. No pleural effusion or pneumothorax. No acute bony abnormality. IMPRESSION: Congestive heart failure with mild pulmonary interstitial edema . Electronically Signed   By: Marcello Moores  Register   On: 01/05/2015 16:43   Dg Chest Port 1 View  01/04/2015  CLINICAL DATA:  Respiratory failure EXAM: PORTABLE CHEST  1 VIEW COMPARISON:  January 03, 2015 FINDINGS: Endotracheal tube and nasogastric tube have been removed. No pneumothorax. Patchy opacity in the lingula and perihilar regions remains without change. No new opacity. Heart is upper normal in size with pulmonary vascularity within normal limits. No adenopathy. IMPRESSION: No pneumothorax. Patchy airspace disease in the lingula and bilateral perihilar regions remains stable. No new opacity. No change in cardiac silhouette. Electronically Signed   By: Lowella Grip III M.D.   On: 01/04/2015 07:06   Dg Chest Portable 1 View  01/03/2015  CLINICAL DATA:  Endotracheal tube placement EXAM: PORTABLE CHEST 1 VIEW COMPARISON:  11/08/2014 FINDINGS: Endotracheal tube tip is 3.6 cm above the carina, well positioned. Nasogastric tube extends into the stomach. Moderate to prominent enlargement of the cardiopericardial silhouette observed. There has been some clearing of the interstitial accentuation although patchy airspace opacities in the lingula and perihilar regions are again observed. Low lung volumes are present, causing crowding of the pulmonary vasculature. No pleural effusion identified. IMPRESSION: 1. Well-positioned endotracheal tube, about 3.6 cm above the carina. Nasogastric tube enters the stomach. 2. Slightly improved aeration although there is some bandlike opacities in the lingula and perihilar regions persisting. 3. Low lung volumes. Electronically Signed   By: Van Clines M.D.   On: 01/03/2015 20:31    ASSESSMENT AND PLAN 1 status post cardiac arrest-patient had an episode of cardiac arrest in July and an echocardiogram at that time showed ejection fraction of 20% with possible takotsubo. Note he had missed dialysis but potassium at that time was 5.2. He has had another event after missing dialysis and potassium 7. Each event occurred following dialysis and potassium certainly could have played a role in each event. We will repeat echocardiogram  to reassess LV function. I also think he should undergo cardiac catheterization to exclude significant coronary disease. The risks and benefits were discussed and we will plan to proceed Friday if INR<1.8.. If LV function improved then ICD would not be indicated as potassium could have contributed to event. If LV function decreased with await results of cardiac catheterization and if patient requires revascularization would then repeat echocardiogram 3 months later. If ejection fraction less than 35% could consider subcutaneous ICD. We will hold Coumadin in anticipation of catheterization on Friday. Add aspirin 81 mg daily. Will give 1 mg IV vitamin  K today. 2 paroxysmal atrial fibrillation-patient remains in sinus rhythm. Continue amiodarone. Will resume Coumadin once all procedures complete.  3 end-stage renal disease-management per nephrology. 4 Hyperlipidemia-discontinue Zocor given potential interaction with amiodarone. Add Lipitor 80 mg daily. 5 Jehovah's Witness   Signed, Darlin Coco MD

## 2015-01-06 NOTE — Progress Notes (Signed)
Patient ID: Daniel Whitney, male   DOB: October 16, 1955, 59 y.o.   MRN: [id number]  [facility] Progress Note    Subjective:   Feels well, no new complaints, understands need for cardiac cath   Objective:   BP 115/59 mmHg  Pulse 68  Temp(Src) 98.4 F (36.9 C) (Oral)  Resp 20  Ht 5\' 9"  (1.753 m)  Wt 110.315 kg (243 lb 3.2 oz)  BMI 35.90 kg/m2  SpO2 99%  Intake/Output: I/O last 3 completed shifts: In: [id number] [P.O.:1495] Out: 3000 [Other:3000]   Intake/Output this shift:    Weight change: 4.027 kg (8 lb 14.1 oz)  Physical Exam: Gen:WD obese AAM in NAD CVS:no rub Resp:cta [id number] Ext: right leg +edema and erythema with ulcerations on calf. LAVF +T/B  Labs: BMET  Recent Labs Lab 01/03/15 1927 01/03/15 1958 01/03/15 2143 01/04/15 0220 01/04/15 0528 01/04/15 0529 01/05/15 0312 01/06/15 0213  NA 128* 130* 130* 135 136  --  132* 133*  K 6.4* 7.0* >7.5* 3.0* 4.1  --  5.3* 3.9  CL 97* 94* 93* 95* 94*  --  91* 94*  CO2  --  19* 20* 27 29  --  28 28  GLUCOSE 150* 150* 138* 98 97  --  130* 188*  BUN 96* 91* 93* 26* 32*  --  53* 34*  CREATININE 10.70* 11.10* 11.33* 4.18* 5.78*  --  8.18* 5.49*  ALBUMIN  --  2.9*  --   --   --   --  2.9* 3.1*  CALCIUM  --  8.8* 7.3* 7.7* 7.5*  --  6.2* 7.1*  PHOS  --   --   --   --   --  6.4* 6.6* 3.6   CBC  Recent Labs Lab 01/03/15 1958 01/04/15 0529 01/05/15 0312 01/06/15 0213  WBC 9.7 11.0* 12.0* 12.9*  NEUTROABS 7.7  --   --   --   HGB 10.8* 10.6* 9.8* 10.5*  HCT 33.8* 31.9* 31.0* 32.5*  MCV 97.4 95.5 96.6 97.0  PLT 210 225 235 248    @IMGRELPRIORS @ Medications:    . amiodarone  200 mg Per Tube Daily  . aspirin  81 mg Oral Daily  . atorvastatin  80 mg Oral q1800  . collagenase   Topical Daily  . doxercalciferol  5 mcg Intravenous Q M,W,F-HD  . insulin aspart  0-9 Units Subcutaneous TID WC  . phytonadione (VITAMIN K) IV  1 mg Intravenous Once  . predniSONE  20 mg Oral Q breakfast  . sevelamer carbonate   2,400 mg Oral TID WC   Dialysis Prescription [facility] 108 kg 4 hours BFR 450 DFR A 1.5 2K 2Ca Left AVF Mircera 100 Q2weeks (last dosed 11/16) Hectorol 5 mcg  Assessment/ Plan:   1. Bradycardic/Cardiac arrest - presumably related to severe hyperkalemia for this episode, however he had an asystolic arrest in July. Improved after HD. Cont with telemetry.  1. Appreciate cardiology evaluation due to cardiac events.  2. Agree with cardiac cath to evaluate for CAD given multiple risk factors. 2. Hyperkalemia- related to dietary indiscretion and nonadherence with HD schedule. Improved after HD. Cont to follow and stressed the importance of compliance with HD. 3. ESRD- plan for HD again tomorrow  4. Anemia: on Micera dosed last week. Continue to follow. 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 Restart binders and follow 6. Nutrition: resume renal diet 7. Hypertension:stable to low today. Hold off on more HD and  UF today due to borderline BP 8. Right leg ulcers- ?decubiti vs calciphylaxis. Wound care nurse following and starting therapy, however looks more edematous and inflamed today.  Will consult [facility] for deep tissue biopsy to r/o calciphylaxis tomorrow. 9. Memory impairment- pt admits to worsening memory over the last several months. Consider imaging and neuro eval (could be done as an outpt). Did have some anoxic injury during his arrest in July. 10. 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 Appreciate cardiology evaluation and agree with plan for cardiac cath 11. A fib- on coumadin/amio PTA, pharmacy managing anticoagulation. 12. VDRF due to AMS and cardiac arrest. Extubated and doing better.  Donetta Potts, MD [place] Pager [phone] 01/06/2015, 8:20 AM

## 2015-01-06 NOTE — Progress Notes (Addendum)
PROGRESS NOTE  Daniel Whitney [id number] DOB: Jan 18, 1956 DOA: 01/03/2015 PCP: No primary care provider on file.  HPI: 59 y.o. male who is [facility] WITNESS, with hx ESRD (M/W/F). He was scheduled for dialysis Mon 11/21; however, he felt bad so he rescheduled for Tues 11/22 (last session Fri 11/18). He was brought to [facility] ED 11/21 after he collapsed at home and required TC pacing en route for HR 40s In ED, found to have hyperkalemia and had 10 min PEA arrest during which he was intubated. He was initially admitted to the ICU service, and transferred to telemetry and TRH took over on 11/23  Subjective / 24 H Interval events - No complaints this morning, denies any chest pain, any palpitations, awaiting cardiac catheterization tomorrow  Assessment/Plan: Active Problems:   Diabetes mellitus with neuropathy ([place])   Hypertension   ESRD (end stage renal disease) on dialysis [facility])   Cardiac arrest ([place])   Hyperkalemia   Acute on chronic combined systolic and diastolic heart failure ([place])   PEA Cardiac arrest - This is his second episode this year, was hospitalized in 22-Sep-2014 when he coded, he was resuscitated then cooled. Likely the setting of hyperkalemia as well as combined systolic and diastolic heart failure - Patient with recently diagnosed systolic heart failure, ejection fraction in 2022-09-22 was 20%. Repeat echo at this time around with an ejection fraction of 55-60% -Cardiology consulted, plans for cardiac catheterization tomorrow   Acute on chronic combined systolic and diastolic heart failure - Monitor patient telemetry - there is a reported history of congestive heart failure, prior to him moving from [place] into [place] area and a repeat echo done here in March 2015 showed normal EF of 55-60%, decreased to 20% in 2022-09-22. I'm not sure what his ejection fraction was when he was up in [place]  Atrial fibrillation, paroxysmal  - patient's CHA2DS2-VASc Score for Stroke Risk is 3 -  Patient on amiodarone - On Coumadin  Pulmonary hypertension / OSA - non compliant with his CPAP  DM - start sliding scale insulin - Hemoglobin A1c less than 7, showing control - CBGs well-controlled, glucose this morning 134, continue current regimen  End-stage renal disease - Patient being followed by nephrology, underwent hemodialysis 11/23 - Continue to monitor potassium levels, 3.9 this morning  HTN - Holding blood pressure medications in the setting of hypotension initially - Holding Coreg  HLD - continue atorvastatin, high dose  Hyperkalemia - Likely in the setting of end-stage renal disease, dialysis per nephrology - Kayexalate if needed   Hypocalcemia - HD  Chronic anemia - In the setting of end-stage renal disease - He is a Sales promotion account executive Witness, 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 EXAM: CHEST  2 VIEW COMPARISON:  01/04/2015. FINDINGS: Mediastinum and hilar structures are normal. Cardiomegaly with pulmonary vascular prominence and interstitial prominence consistent with congestive heart failure. No pleural effusion or pneumothorax. No acute bony abnormality. IMPRESSION: Congestive  heart failure with mild pulmonary interstitial edema . Electronically Signed   By: Marcello Moores  Register   On: 01/05/2015 16:43    Objective  Filed Vitals:   01/05/15 1258 01/05/15 2033 01/05/15 2336 01/06/15 0442  BP:  106/89 94/51 93/51  115/59  Pulse: 116 64 72 68  Temp:  99.2 F (37.3 C) 99.3 F (37.4 C) 98.4 F (36.9 C)  TempSrc:  Oral Oral Oral  Resp:  18 18 20   Height:      Weight:    110.315 kg (243 lb 3.2 oz)  SpO2:  96% 98% 99%    Intake/Output Summary (Last 24 hours) at 01/06/15 1303 Last data filed at 01/06/15 0836  Gross per 24 hour  Intake   1320 ml  Output      0 ml  Net   1320 ml   Filed Weights   01/05/15 0710 01/05/15 1130 01/06/15 0442  Weight: 112.8 kg (248 lb 10.9 oz) 109.2 kg (240 lb 11.9 oz) 110.315 kg (243 lb 3.2 oz)    Exam:  GENERAL: NAD  HEENT: head NCAT, no scleral icterus.   NECK: Supple. No LAD  LUNGS: Clear to auscultation. No wheezing or crackles  HEART: Regular rate and rhythm without murmur. 2+ pulses, no JVD, no peripheral edema. Bradycardic  ABDOMEN: Soft, non-distended, non-tender. Positive bowel sounds.  EXTREMITIES: Without any cyanosis or clubbing. Good muscle tone    Data Reviewed: Basic Metabolic Panel:  Recent Labs Lab 01/03/15 2143 01/04/15 0220 01/04/15 0528 01/04/15 0529 01/05/15 0312 01/06/15 0213  NA 130* 135 136  --  132* 133*  K >7.5* 3.0* 4.1  --  5.3* 3.9  CL 93* 95* 94*  --  91* 94*  CO2 20* 27 29  --  28 28  GLUCOSE 138* 98 97  --  130* 188*  BUN 93* 26* 32*  --  53* 34*  CREATININE 11.33* 4.18* 5.78*  --  8.18* 5.49*  CALCIUM 7.3* 7.7* 7.5*  --  6.2* 7.1*  MG  --   --   --  1.9  --   --   PHOS  --   --   --  6.4* 6.6* 3.6   Liver Function Tests:  Recent Labs Lab 01/03/15 1958 01/05/15 0312 01/06/15 0213  AST 51*  --   --   ALT 56  --   --   ALKPHOS 161*  --   --   BILITOT 0.7  --   --   PROT 6.5  --   --   ALBUMIN 2.9* 2.9* 3.1*   CBC:  Recent Labs Lab 01/03/15 1927 01/03/15 1958 01/04/15 0529 01/05/15 0312 01/06/15 0213  WBC  --  9.7 11.0* 12.0* 12.9*  NEUTROABS  --  7.7  --   --   --   HGB 11.9* 10.8* 10.6* 9.8* 10.5*  HCT 35.0* 33.8* 31.9* 31.0* 32.5*  MCV  --  97.4 95.5 96.6 97.0    PLT  --  210 225 235 248   Cardiac Enzymes:  Recent Labs Lab 01/03/15 1929 01/03/15 1958 01/04/15 0220 01/04/15 0728  TROPONINI <0.03 <0.03 0.06* 0.06*   ProBNP (last 3 results)  Recent Labs  01/29/14 0748  PROBNP 7824.0*    CBG:  Recent Labs Lab 01/04/15 2109 01/05/15 1217 01/05/15 1649 01/05/15 2135 01/06/15 1114  GLUCAP 164* 137* 118* 161* 134*    Recent Results (from the past 240 hour(s))  MRSA PCR Screening     Status: None   Collection  Time: 01/03/15 11:23 PM  Result Value Ref Range Status   MRSA by PCR NEGATIVE NEGATIVE Final    Comment:        The GeneXpert MRSA Assay (FDA approved for NASAL specimens only), is one component of a comprehensive MRSA colonization surveillance program. It is not intended to diagnose MRSA infection nor to guide or monitor treatment for MRSA infections.      Scheduled Meds: . amiodarone  200 mg Per Tube Daily  . aspirin  81 mg Oral Daily  . atorvastatin  80 mg Oral q1800  . collagenase   Topical Daily  . doxercalciferol  5 mcg Intravenous Q M,W,F-HD  . insulin aspart  0-9 Units Subcutaneous TID WC  . sevelamer carbonate  2,400 mg Oral TID WC   Continuous Infusions: . albuterol      Marzetta Board, MD [facility] Pager [phone]. If 7 PM - 7 AM, please contact night-coverage at [email or web address], password [facility] 01/06/2015, 1:03 PM  LOS: 3 days

## 2015-01-06 NOTE — Progress Notes (Signed)
Patient refused CPAP. RT will continue to monitor.  

## 2015-01-06 NOTE — Progress Notes (Signed)
Dressing changed to diabetic ulcer noted on right lower posterior leg. 50% of wound bed is pale, 50% has some appearance of necrotic area noted. Cleansed with normal saline. Applied Santyl ointment per order. Covered with gauze moistened with normal saline and wrapped with Kerlex. Dressing is clean dry and intact. Patient tolerated it well. But states has some pain on pain level of 3. Will given tylenol per PRN order. Will continue to monitor patient to end of shift.

## 2015-01-07 ENCOUNTER — Encounter (HOSPITAL_COMMUNITY)
Admission: EM | Disposition: A | Discharge status: Home / Self Care | Payer: Self-pay | Source: Home / Self Care | Attending: Internal Medicine

## 2015-01-07 DIAGNOSIS — I480 Paroxysmal atrial fibrillation: Secondary | ICD-10-CM

## 2015-01-07 DIAGNOSIS — R079 Chest pain, unspecified: Secondary | ICD-10-CM | POA: Insufficient documentation

## 2015-01-07 DIAGNOSIS — E785 Hyperlipidemia, unspecified: Secondary | ICD-10-CM

## 2015-01-07 HISTORY — PX: CARDIAC CATHETERIZATION: SHX172

## 2015-01-07 LAB — RENAL FUNCTION PANEL
ALBUMIN: 3.1 g/dL — AB (ref 3.5–5.0)
Anion gap: 15 (ref 5–15)
BUN: 59 mg/dL — AB (ref 6–20)
CO2: 26 mmol/L (ref 22–32)
CREATININE: 7.28 mg/dL — AB (ref 0.61–1.24)
Calcium: 7.2 mg/dL — ABNORMAL LOW (ref 8.9–10.3)
Chloride: 90 mmol/L — ABNORMAL LOW (ref 101–111)
GFR calc Af Amer: 8 mL/min — ABNORMAL LOW (ref >60)
GFR, EST NON AFRICAN AMERICAN: 7 mL/min — AB (ref >60)
GLUCOSE: 143 mg/dL — AB (ref 65–99)
PHOSPHORUS: 3.7 mg/dL (ref 2.5–4.6)
Potassium: 4.6 mmol/L (ref 3.5–5.1)
SODIUM: 131 mmol/L — AB (ref 135–145)

## 2015-01-07 LAB — GLUCOSE, CAPILLARY
GLUCOSE-CAPILLARY: 117 mg/dL — AB (ref 65–99)
GLUCOSE-CAPILLARY: 101 mg/dL — AB (ref 65–99)
GLUCOSE-CAPILLARY: 170 mg/dL — AB (ref 65–99)
Glucose-Capillary: 107 mg/dL — ABNORMAL HIGH (ref 65–99)

## 2015-01-07 LAB — CBC
HCT: 34.1 % — ABNORMAL LOW (ref 39.0–52.0)
Hemoglobin: 10.9 g/dL — ABNORMAL LOW (ref 13.0–17.0)
MCH: 30.8 pg (ref 26.0–34.0)
MCHC: 32 g/dL (ref 30.0–36.0)
MCV: 96.3 fL (ref 78.0–100.0)
PLATELETS: 249 10*3/uL (ref 150–400)
RBC: 3.54 MIL/uL — ABNORMAL LOW (ref 4.22–5.81)
RDW: 16.4 % — AB (ref 11.5–15.5)
WBC: 16.2 10*3/uL — AB (ref 4.0–10.5)

## 2015-01-07 LAB — PROTIME-INR
INR: 1.36 (ref 0.00–1.49)
PROTHROMBIN TIME: 16.8 s — AB (ref 11.6–15.2)

## 2015-01-07 SURGERY — LEFT HEART CATH AND CORONARY ANGIOGRAPHY
Anesthesia: LOCAL

## 2015-01-07 MED ORDER — SODIUM CHLORIDE 0.9 % IV SOLN: 250.0000 mL | INTRAVENOUS | Status: DC | PRN

## 2015-01-07 MED ORDER — IOHEXOL 350 MG/ML SOLN
INTRAVENOUS | Status: DC | PRN
  Administered 2015-01-07: 55 mL via INTRAVENOUS

## 2015-01-07 MED ORDER — SODIUM CHLORIDE 0.9 % IJ SOLN: 3.0000 mL | INTRAMUSCULAR | Status: DC | PRN

## 2015-01-07 MED ORDER — FENTANYL CITRATE (PF) 100 MCG/2ML IJ SOLN
INTRAMUSCULAR | Status: AC
  Filled 2015-01-07: qty 2

## 2015-01-07 MED ORDER — MIDAZOLAM HCL 2 MG/2ML IJ SOLN
INTRAMUSCULAR | Status: AC
  Filled 2015-01-07: qty 2

## 2015-01-07 MED ORDER — ACETAMINOPHEN 325 MG PO TABS
650.0000 mg | ORAL_TABLET | ORAL | Status: DC | PRN
  Administered 2015-01-07 – 2015-01-08 (×3): 650 mg via ORAL
  Filled 2015-01-07 (×3): qty 2

## 2015-01-07 MED ORDER — SODIUM CHLORIDE 0.9 % IJ SOLN: 10.0000 mL | INTRAMUSCULAR | Status: DC | PRN

## 2015-01-07 MED ORDER — MIDAZOLAM HCL 2 MG/2ML IJ SOLN
INTRAMUSCULAR | Status: DC | PRN
  Administered 2015-01-07 (×2): 1 mg via INTRAVENOUS

## 2015-01-07 MED ORDER — ONDANSETRON HCL 4 MG/2ML IJ SOLN: 4.0000 mg | Freq: Four times a day (QID) | INTRAMUSCULAR | Status: DC | PRN

## 2015-01-07 MED ORDER — FENTANYL CITRATE (PF) 100 MCG/2ML IJ SOLN
INTRAMUSCULAR | Status: DC | PRN
  Administered 2015-01-07 (×2): 25 ug via INTRAVENOUS

## 2015-01-07 MED ORDER — SODIUM CHLORIDE 0.9 % IJ SOLN
3.0000 mL | INTRAMUSCULAR | Status: DC | PRN
Start: 2015-01-07 — End: 2015-01-08

## 2015-01-07 MED ORDER — LIDOCAINE HCL (PF) 1 % IJ SOLN
INTRAMUSCULAR | Status: DC | PRN
  Administered 2015-01-07: 11:00:00

## 2015-01-07 MED ORDER — LIDOCAINE HCL (PF) 1 % IJ SOLN
INTRAMUSCULAR | Status: DC | PRN
  Administered 2015-01-07: 30 mL via INTRADERMAL

## 2015-01-07 MED ORDER — HEPARIN (PORCINE) IN NACL 2-0.9 UNIT/ML-% IJ SOLN
INTRAMUSCULAR | Status: AC
  Filled 2015-01-07: qty 1000

## 2015-01-07 MED ORDER — SODIUM CHLORIDE 0.9 % IJ SOLN
3.0000 mL | Freq: Two times a day (BID) | INTRAMUSCULAR | Status: DC
  Administered 2015-01-07 – 2015-01-08 (×2): 3 mL via INTRAVENOUS

## 2015-01-07 MED ORDER — SODIUM CHLORIDE 0.9 % IJ SOLN: 3.0000 mL | Freq: Two times a day (BID) | INTRAMUSCULAR | Status: DC

## 2015-01-07 MED ORDER — LIDOCAINE HCL (PF) 1 % IJ SOLN
INTRAMUSCULAR | Status: AC
  Filled 2015-01-07: qty 30

## 2015-01-07 SURGICAL SUPPLY — 9 items
CATH INFINITI 5FR JL5 (CATHETERS) ×2 IMPLANT
CATH INFINITI 5FR MULTPACK ANG (CATHETERS) ×2 IMPLANT
CATH SITESEER 5F MULTI A 2 (CATHETERS) ×2 IMPLANT
KIT HEART LEFT (KITS) ×2 IMPLANT
PACK CARDIAC CATHETERIZATION (CUSTOM PROCEDURE TRAY) ×2 IMPLANT
SHEATH PINNACLE 5F 10CM (SHEATH) ×2 IMPLANT
SYR MEDRAD MARK V 150ML (SYRINGE) ×2 IMPLANT
TRANSDUCER W/STOPCOCK (MISCELLANEOUS) ×2 IMPLANT
WIRE EMERALD 3MM-J .035X150CM (WIRE) ×2 IMPLANT

## 2015-01-07 NOTE — Progress Notes (Addendum)
Returned from cath lab.  Left Groin with dressing clean, dry, and intact and site = level 0.  Patient instructed that bedrest x 4 hours, until 3pm.  Stated understanding.  CBG = 101 on return to room.

## 2015-01-07 NOTE — Progress Notes (Signed)
Site area: LT GROIN FA SHEATH Site Prior to Removal:  Level 0 Pressure Applied For:  20 MINUTES Manual:   YES Patient Status During Pull:  STABLE Post Pull Site:  Level  0 Post Pull Instructions Given:  YES Post Pull Pulses Present: YES Dressing Applied:  TEGADERM Bedrest begins @   1130 Comments:   Iv SALINE LOCKED

## 2015-01-07 NOTE — Plan of Care (Signed)
Problem: Phase I Progression Outcomes Goal: Voiding-avoid urinary catheter unless indicated Outcome: Not Applicable Date Met:  69/62/95 oliguric

## 2015-01-07 NOTE — Interval H&P Note (Signed)
Cath Lab Visit (complete for each Cath Lab visit)  Clinical Evaluation Leading to the Procedure:   ACS: No.  Non-ACS:    Anginal Classification: CCS II  Anti-ischemic medical therapy: Minimal Therapy (1 class of medications)  Non-Invasive Test Results: No non-invasive testing performed  Prior CABG: No previous CABG      History and Physical Interval Note:  01/07/2015 10:20 AM  Daniel Whitney  has presented today for surgery, with the diagnosis of cp  The various methods of treatment have been discussed with the patient and family. After consideration of risks, benefits and other options for treatment, the patient has consented to  Procedure(s): Left Heart Cath and Coronary Angiography (N/A) as a surgical intervention .  The patient's history has been reviewed, patient examined, no change in status, stable for surgery.  I have reviewed the patient's chart and labs.  Questions were answered to the patient's satisfaction.     Eliav Mechling A

## 2015-01-07 NOTE — Progress Notes (Signed)
Notified IV team of noticing that at the site of triple lumen central line in the right groin area, seems to have bled more from earlier in the shift. Earlier in during assessment of patient, noticed small amount of dried blood size of about an alcohol swab but now is dried blood is about the size of 4x4 gauze. Will have IV team assess and evaluate prior to connecting IV fluids that are ordered per Pre-Cath procedure orders. Patient states no pain or discomfort in right groin area. Will continue to monitor patient to end of shift.

## 2015-01-07 NOTE — H&P (View-Only) (Signed)
Patient Name: Daniel Whitney Date of Encounter: 01/07/2015     Active Problems:   Diabetes mellitus with neuropathy ([place])   Hypertension   ESRD (end stage renal disease) on dialysis [facility])   Cardiac arrest ([place])   Hyperkalemia   Acute on chronic combined systolic and diastolic heart failure ([place])    SUBJECTIVE  The patient denies any chest pain or dyspnea overnight. Residual chest soreness from CPR. Rhythm remains NSR. Warfarin on hold for cath tomorrow. INR>2 this am.  CURRENT MEDS . amiodarone  200 mg Per Tube Daily  . aspirin  81 mg Oral Daily  . atorvastatin  80 mg Oral q1800  . collagenase   Topical Daily  . doxercalciferol  5 mcg Intravenous Q M,W,F-HD  . insulin aspart  0-9 Units Subcutaneous TID WC  . sevelamer carbonate  2,400 mg Oral TID WC  . sodium chloride  3 mL Intravenous Q12H    OBJECTIVE  Filed Vitals:   01/06/15 0442 01/06/15 1315 01/06/15 2001 01/07/15 0538  BP: 115/59 101/44 110/62 125/67  Pulse: 68 69 64 63  Temp: 98.4 F (36.9 C) 98.4 F (36.9 C) 98.3 F (36.8 C) 98 F (36.7 C)  TempSrc: Oral Oral Oral Oral  Resp: 20 18 18 18   Height:      Weight: 243 lb 3.2 oz (110.315 kg)   245 lb 1.6 oz (111.177 kg)  SpO2: 99% 98% 99% 100%    Intake/Output Summary (Last 24 hours) at 01/07/15 0801 Last data filed at 01/06/15 2233  Gross per 24 hour  Intake    723 ml  Output      0 ml  Net    723 ml   Filed Weights   01/05/15 1130 01/06/15 0442 01/07/15 0538  Weight: 240 lb 11.9 oz (109.2 kg) 243 lb 3.2 oz (110.315 kg) 245 lb 1.6 oz (111.177 kg)    PHYSICAL EXAM  General: Pleasant, NAD. Neuro: Alert and oriented X 3. Moves all extremities spontaneously. Psych: Normal affect. HEENT:  Normal  Neck: Supple without bruits or JVD. Lungs:  Resp regular and unlabored, CTA. Heart: RRR no s3, s4, or murmurs. Abdomen: Soft, non-tender, non-distended, BS + x 4.  Extremities: Mild edema right foot. IV catheter in right groin.   Accessory Clinical  Findings  CBC  Recent Labs  01/06/15 0213 01/07/15 0330  WBC 12.9* 16.2*  HGB 10.5* 10.9*  HCT 32.5* 34.1*  MCV 97.0 96.3  PLT 248 0000000   Basic Metabolic Panel  Recent Labs  01/06/15 0213 01/07/15 0330  NA 133* 131*  K 3.9 4.6  CL 94* 90*  CO2 28 26  GLUCOSE 188* 143*  BUN 34* 59*  CREATININE 5.49* 7.28*  CALCIUM 7.1* 7.2*  PHOS 3.6 3.7   Liver Function Tests  Recent Labs  01/06/15 0213 01/07/15 0330  ALBUMIN 3.1* 3.1*   No results for input(s): LIPASE, AMYLASE in the last 72 hours. Cardiac Enzymes No results for input(s): CKTOTAL, CKMB, CKMBINDEX, TROPONINI in the last 72 hours. BNP Invalid input(s): POCBNP D-Dimer No results for input(s): DDIMER in the last 72 hours. Hemoglobin A1C No results for input(s): HGBA1C in the last 72 hours. Fasting Lipid Panel No results for input(s): CHOL, HDL, LDLCALC, TRIG, CHOLHDL, LDLDIRECT in the last 72 hours. Thyroid Function Tests No results for input(s): TSH, T4TOTAL, T3FREE, THYROIDAB in the last 72 hours.  Invalid input(s): FREET3  TELE  NSR  ECG    Radiology/Studies  Dg Chest 2 View  01/05/2015  CLINICAL DATA:  Right-sided chest pain. EXAM: CHEST  2 VIEW COMPARISON:  01/04/2015. FINDINGS: Mediastinum and hilar structures are normal. Cardiomegaly with pulmonary vascular prominence and interstitial prominence consistent with congestive heart failure. No pleural effusion or pneumothorax. No acute bony abnormality. IMPRESSION: Congestive heart failure with mild pulmonary interstitial edema . Electronically Signed   By: Marcello Moores  Register   On: 01/05/2015 16:43   Dg Chest Port 1 View  01/04/2015  CLINICAL DATA:  Respiratory failure EXAM: PORTABLE CHEST 1 VIEW COMPARISON:  January 03, 2015 FINDINGS: Endotracheal tube and nasogastric tube have been removed. No pneumothorax. Patchy opacity in the lingula and perihilar regions remains without change. No new opacity. Heart is upper normal in size with pulmonary  vascularity within normal limits. No adenopathy. IMPRESSION: No pneumothorax. Patchy airspace disease in the lingula and bilateral perihilar regions remains stable. No new opacity. No change in cardiac silhouette. Electronically Signed   By: Lowella Grip III M.D.   On: 01/04/2015 07:06   Dg Chest Portable 1 View  01/03/2015  CLINICAL DATA:  Endotracheal tube placement EXAM: PORTABLE CHEST 1 VIEW COMPARISON:  11/08/2014 FINDINGS: Endotracheal tube tip is 3.6 cm above the carina, well positioned. Nasogastric tube extends into the stomach. Moderate to prominent enlargement of the cardiopericardial silhouette observed. There has been some clearing of the interstitial accentuation although patchy airspace opacities in the lingula and perihilar regions are again observed. Low lung volumes are present, causing crowding of the pulmonary vasculature. No pleural effusion identified. IMPRESSION: 1. Well-positioned endotracheal tube, about 3.6 cm above the carina. Nasogastric tube enters the stomach. 2. Slightly improved aeration although there is some bandlike opacities in the lingula and perihilar regions persisting. 3. Low lung volumes. Electronically Signed   By: Van Clines M.D.   On: 01/03/2015 20:31    ASSESSMENT AND PLAN 1 status post cardiac arrest-patient had an episode of cardiac arrest in July and an echocardiogram at that time showed ejection fraction of 20% with possible takotsubo. Note he had missed dialysis but potassium at that time was 5.2. He has had another event after missing dialysis and potassium 7. Each event occurred following missing dialysis and potassium certainly could have played a role in each event. However, ischemia needs to be excluded; I think he should undergo cardiac catheterization to exclude significant coronary disease. The risks and benefits were discussed and we will plan to proceed as INR improved. ICD not indicated as LV function has improved (EF 55-60 by echo on  this admission). We will hold Coumadin in anticipation of catheterization on Friday. Continue aspirin 81 mg daily. 2 paroxysmal atrial fibrillation-patient remains in sinus rhythm. Continue amiodarone. Will resume Coumadin once all procedures complete.  3 end-stage renal disease-management per nephrology. 4 Hyperlipidemia-continue Lipitor 80 mg daily. 5 Jehovah's Witness   Signed, Darlin Coco MD

## 2015-01-07 NOTE — Progress Notes (Signed)
Patient Name: Daniel Whitney Date of Encounter: 01/07/2015     Active Problems:   Diabetes mellitus with neuropathy ([place])   Hypertension   ESRD (end stage renal disease) on dialysis [facility])   Cardiac arrest ([place])   Hyperkalemia   Acute on chronic combined systolic and diastolic heart failure ([place])    SUBJECTIVE  The patient denies any chest pain or dyspnea overnight. Residual chest soreness from CPR. Rhythm remains NSR. Warfarin on hold for cath tomorrow. INR>2 this am.  CURRENT MEDS . amiodarone  200 mg Per Tube Daily  . aspirin  81 mg Oral Daily  . atorvastatin  80 mg Oral q1800  . collagenase   Topical Daily  . doxercalciferol  5 mcg Intravenous Q M,W,F-HD  . insulin aspart  0-9 Units Subcutaneous TID WC  . sevelamer carbonate  2,400 mg Oral TID WC  . sodium chloride  3 mL Intravenous Q12H    OBJECTIVE  Filed Vitals:   01/06/15 0442 01/06/15 1315 01/06/15 2001 01/07/15 0538  BP: 115/59 101/44 110/62 125/67  Pulse: 68 69 64 63  Temp: 98.4 F (36.9 C) 98.4 F (36.9 C) 98.3 F (36.8 C) 98 F (36.7 C)  TempSrc: Oral Oral Oral Oral  Resp: 20 18 18 18   Height:      Weight: 243 lb 3.2 oz (110.315 kg)   245 lb 1.6 oz (111.177 kg)  SpO2: 99% 98% 99% 100%    Intake/Output Summary (Last 24 hours) at 01/07/15 0801 Last data filed at 01/06/15 2233  Gross per 24 hour  Intake    723 ml  Output      0 ml  Net    723 ml   Filed Weights   01/05/15 1130 01/06/15 0442 01/07/15 0538  Weight: 240 lb 11.9 oz (109.2 kg) 243 lb 3.2 oz (110.315 kg) 245 lb 1.6 oz (111.177 kg)    PHYSICAL EXAM  General: Pleasant, NAD. Neuro: Alert and oriented X 3. Moves all extremities spontaneously. Psych: Normal affect. HEENT:  Normal  Neck: Supple without bruits or JVD. Lungs:  Resp regular and unlabored, CTA. Heart: RRR no s3, s4, or murmurs. Abdomen: Soft, non-tender, non-distended, BS + x 4.  Extremities: Mild edema right foot. IV catheter in right groin.   Accessory Clinical  Findings  CBC  Recent Labs  01/06/15 0213 01/07/15 0330  WBC 12.9* 16.2*  HGB 10.5* 10.9*  HCT 32.5* 34.1*  MCV 97.0 96.3  PLT 248 0000000   Basic Metabolic Panel  Recent Labs  01/06/15 0213 01/07/15 0330  NA 133* 131*  K 3.9 4.6  CL 94* 90*  CO2 28 26  GLUCOSE 188* 143*  BUN 34* 59*  CREATININE 5.49* 7.28*  CALCIUM 7.1* 7.2*  PHOS 3.6 3.7   Liver Function Tests  Recent Labs  01/06/15 0213 01/07/15 0330  ALBUMIN 3.1* 3.1*   No results for input(s): LIPASE, AMYLASE in the last 72 hours. Cardiac Enzymes No results for input(s): CKTOTAL, CKMB, CKMBINDEX, TROPONINI in the last 72 hours. BNP Invalid input(s): POCBNP D-Dimer No results for input(s): DDIMER in the last 72 hours. Hemoglobin A1C No results for input(s): HGBA1C in the last 72 hours. Fasting Lipid Panel No results for input(s): CHOL, HDL, LDLCALC, TRIG, CHOLHDL, LDLDIRECT in the last 72 hours. Thyroid Function Tests No results for input(s): TSH, T4TOTAL, T3FREE, THYROIDAB in the last 72 hours.  Invalid input(s): FREET3  TELE  NSR  ECG    Radiology/Studies  Dg Chest 2 View  01/05/2015  CLINICAL DATA:  Right-sided chest pain. EXAM: CHEST  2 VIEW COMPARISON:  01/04/2015. FINDINGS: Mediastinum and hilar structures are normal. Cardiomegaly with pulmonary vascular prominence and interstitial prominence consistent with congestive heart failure. No pleural effusion or pneumothorax. No acute bony abnormality. IMPRESSION: Congestive heart failure with mild pulmonary interstitial edema . Electronically Signed   By: Marcello Moores  Register   On: 01/05/2015 16:43   Dg Chest Port 1 View  01/04/2015  CLINICAL DATA:  Respiratory failure EXAM: PORTABLE CHEST 1 VIEW COMPARISON:  January 03, 2015 FINDINGS: Endotracheal tube and nasogastric tube have been removed. No pneumothorax. Patchy opacity in the lingula and perihilar regions remains without change. No new opacity. Heart is upper normal in size with pulmonary  vascularity within normal limits. No adenopathy. IMPRESSION: No pneumothorax. Patchy airspace disease in the lingula and bilateral perihilar regions remains stable. No new opacity. No change in cardiac silhouette. Electronically Signed   By: Lowella Grip III M.D.   On: 01/04/2015 07:06   Dg Chest Portable 1 View  01/03/2015  CLINICAL DATA:  Endotracheal tube placement EXAM: PORTABLE CHEST 1 VIEW COMPARISON:  11/08/2014 FINDINGS: Endotracheal tube tip is 3.6 cm above the carina, well positioned. Nasogastric tube extends into the stomach. Moderate to prominent enlargement of the cardiopericardial silhouette observed. There has been some clearing of the interstitial accentuation although patchy airspace opacities in the lingula and perihilar regions are again observed. Low lung volumes are present, causing crowding of the pulmonary vasculature. No pleural effusion identified. IMPRESSION: 1. Well-positioned endotracheal tube, about 3.6 cm above the carina. Nasogastric tube enters the stomach. 2. Slightly improved aeration although there is some bandlike opacities in the lingula and perihilar regions persisting. 3. Low lung volumes. Electronically Signed   By: Van Clines M.D.   On: 01/03/2015 20:31    ASSESSMENT AND PLAN 1 status post cardiac arrest-patient had an episode of cardiac arrest in July and an echocardiogram at that time showed ejection fraction of 20% with possible takotsubo. Note he had missed dialysis but potassium at that time was 5.2. He has had another event after missing dialysis and potassium 7. Each event occurred following missing dialysis and potassium certainly could have played a role in each event. However, ischemia needs to be excluded; I think he should undergo cardiac catheterization to exclude significant coronary disease. The risks and benefits were discussed and we will plan to proceed as INR improved. ICD not indicated as LV function has improved (EF 55-60 by echo on  this admission). We will hold Coumadin in anticipation of catheterization on Friday. Continue aspirin 81 mg daily. 2 paroxysmal atrial fibrillation-patient remains in sinus rhythm. Continue amiodarone. Will resume Coumadin once all procedures complete.  3 end-stage renal disease-management per nephrology. 4 Hyperlipidemia-continue Lipitor 80 mg daily. 5 Jehovah's Witness   Signed, Darlin Coco MD

## 2015-01-07 NOTE — Progress Notes (Signed)
Patient ID: Daniel Whitney, male   DOB: [birth year], 59 y.o.   MRN: [id number]  [facility] Progress Note    Subjective:   Feels well had cardiac cath today and did well   Objective:   BP 125/67 mmHg  Pulse 63  Temp(Src) 98 F (36.7 C) (Oral)  Resp 18  Ht 5\' 9"  (1.753 m)  Wt 111.177 kg (245 lb 1.6 oz)  BMI 36.18 kg/m2  SpO2 100%  Intake/Output: I/O last 3 completed shifts: In: 1203 [P.O.:1200; I.V.:3] Out: 0    Intake/Output this shift:    Weight change: -1.623 kg (-3 lb 9.3 oz)  Physical Exam: Gen:WD obese AAM in NAD CVS:no rub Resp:CTA [id number] edema, R AVF +T/B  Labs: BMET  Recent Labs Lab 01/03/15 1958 01/03/15 2143 01/04/15 0220 01/04/15 0528 01/04/15 0529 01/05/15 0312 01/06/15 0213 01/07/15 0330  NA 130* 130* 135 136  --  132* 133* 131*  K 7.0* >7.5* 3.0* 4.1  --  5.3* 3.9 4.6  CL 94* 93* 95* 94*  --  91* 94* 90*  CO2 19* 20* 27 29  --  28 28 26   GLUCOSE 150* 138* 98 97  --  130* 188* 143*  BUN 91* 93* 26* 32*  --  53* 34* 59*  CREATININE 11.10* 11.33* 4.18* 5.78*  --  8.18* 5.49* 7.28*  ALBUMIN 2.9*  --   --   --   --  2.9* 3.1* 3.1*  CALCIUM 8.8* 7.3* 7.7* 7.5*  --  6.2* 7.1* 7.2*  PHOS  --   --   --   --  6.4* 6.6* 3.6 3.7   CBC  Recent Labs Lab 01/03/15 1958 01/04/15 0529 01/05/15 0312 01/06/15 0213 01/07/15 0330  WBC 9.7 11.0* 12.0* 12.9* 16.2*  NEUTROABS 7.7  --   --   --   --   HGB 10.8* 10.6* 9.8* 10.5* 10.9*  HCT 33.8* 31.9* 31.0* 32.5* 34.1*  MCV 97.4 95.5 96.6 97.0 96.3  PLT 210 225 235 248 249    @IMGRELPRIORS @ Medications:    . amiodarone  200 mg Per Tube Daily  . aspirin  81 mg Oral Daily  . atorvastatin  80 mg Oral q1800  . collagenase   Topical Daily  . doxercalciferol  5 mcg Intravenous Q M,W,F-HD  . insulin aspart  0-9 Units Subcutaneous TID WC  . sevelamer carbonate  2,400 mg Oral TID WC  . sodium chloride  3 mL Intravenous Q12H   Dialysis Prescription [facility] ED[facility] 108 kg 4  hours BFR 450 DFR A 1.5 2K 2Ca Left AVF Mircera 100 Q2weeks (last dosed 11/16) Hectorol 5 mcg  Assessment/ Plan:   1. Bradycardic/Cardiac arrest - presumably related to severe hyperkalemia for this episode, however he had an asystolic arrest in July. Improved after HD. Cont with telemetry.  1. Appreciate cardiology evaluation due to cardiac events.  2. Agree with cardiac cath to evaluate for CAD given multiple risk factors. 2. Hyperkalemia- related to dietary indiscretion and nonadherence with HD schedule. Improved after HD. Cont to follow and stressed the importance of compliance with HD. 3. ESRD- plan for HD again 01/08/15 due to holiday schedule.  Then will get back on schedule 01/10/15.  4. Anemia: on Micera dosed last week. Continue to follow. 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 Restart binders and follow 6. Nutrition: resume renal diet 7. Hypertension:stable to low today. Hold off on more HD and UF today due to  borderline BP 8. Right leg ulcers- ?decubiti vs calciphylaxis. Wound care nurse following and starting therapy, however looks more edematous and inflamed today. Will consult [facility] for deep tissue biopsy to r/o calciphylaxis tomorrow. 9. Memory impairment- pt admits to worsening memory over the last several months. Consider imaging and neuro eval (could be done as an outpt). Did have some anoxic injury during his arrest in July. 10. 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 Appreciate cardiology evaluation and agree with plan for cardiac cath 11. A fib- on coumadin/amio PTA, pharmacy managing anticoagulation. 12. VDRF due to AMS and cardiac arrest. Extubated and doing better.  Donetta Potts, MD [place] Pager [phone] 01/07/2015, 8:49 AM

## 2015-01-07 NOTE — Progress Notes (Signed)
PROGRESS NOTE  Daniel Whitney [id number] DOB: 07/18/1955 DOA: 01/03/2015 PCP: No primary care provider on file.  HPI: 59 y.o. male who is [facility] WITNESS, with hx ESRD (M/W/F). He was scheduled for dialysis Mon 11/21; however, he felt bad so he rescheduled for Tues 11/22 (last session Fri 11/18). He was brought to [facility] ED 11/21 after he collapsed at home and required TC pacing en route for HR 40s In ED, found to have hyperkalemia and had 10 min PEA arrest during which he was intubated. He was initially admitted to the ICU service, and transferred to telemetry and [place] took over on 11/23  Subjective / 24 H Interval events - increased oozing at the right femoral central line this morning, patient without complaints, awaiting cath   Assessment/Plan: Active Problems:   Diabetes mellitus with neuropathy ([place])   Hypertension   ESRD (end stage renal disease) on dialysis [facility])   Cardiac arrest ([place])   Hyperkalemia   Acute on chronic combined systolic and diastolic heart failure ([place])   Chest pain   PEA Cardiac arrest - This is his second episode this year, was hospitalized in 20-Sep-2014 when he coded, he was resuscitated then cooled. Likely the setting of hyperkalemia as well as combined systolic and diastolic heart failure - Patient with recently diagnosed systolic heart failure, ejection fraction in 2022-09-20 was 20%. Repeat echo at this time around with an ejection fraction of 55-60% - Cardiology consulted, cath today without significant obstructive disease   Acute on chronic combined systolic and diastolic heart failure - Monitor patient telemetry - EF improved  Atrial fibrillation, paroxysmal  - patient's CHA2DS2-VASc Score for Stroke Risk is 3 - Patient on amiodarone - resume coumadin tomorrow  Pulmonary hypertension / OSA - non compliant with his CPAP  DM - start sliding scale insulin - Hemoglobin A1c less than 7, showing control - CBGs well-controlled, glucose this morning  134, continue current regimen  End-stage renal disease - Patient being followed by nephrology, underwent hemodialysis 11/23 - Continue to monitor potassium levels, 3.9 this morning  HTN - Holding blood pressure medications in the setting of hypotension initially - Holding Coreg  HLD - continue atorvastatin, high dose  Hyperkalemia - Likely in the setting of end-stage renal disease, dialysis per nephrology - Kayexalate if needed   Hypocalcemia - HD  Chronic anemia - In the setting of end-stage renal disease - He is a Sales promotion account executive Witness, 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 EXAM: CHEST  2 VIEW COMPARISON:  01/04/2015. FINDINGS: Mediastinum and hilar structures are normal. Cardiomegaly with pulmonary vascular prominence and interstitial prominence consistent with congestive heart failure. No pleural effusion or pneumothorax. No acute bony abnormality. IMPRESSION: Congestive heart failure with mild pulmonary interstitial edema . Electronically Signed   By: [place]   On: 01/05/2015 16:43    Objective  Filed Vitals:   01/07/15 1125 01/07/15 1130 01/07/15 1135 01/07/15 1200  BP: 150/76 133/73  146/73 127/68  Pulse: 59 62 60 63  Temp:    97.8 F (36.6 C)  TempSrc:    Oral  Resp: 20 17 16 16   Height:      Weight:      SpO2: 99% 100% 99% 96%    Intake/Output Summary (Last 24 hours) at 01/07/15 1237 Last data  filed at 01/07/15 0905  Gross per 24 hour  Intake    483 ml  Output      0 ml  Net    483 ml   Filed Weights   01/05/15 1130 01/06/15 0442 01/07/15 0538  Weight: 109.2 kg (240 lb 11.9 oz) 110.315 kg (243 lb 3.2 oz) 111.177 kg (245 lb 1.6 oz)    Exam:  GENERAL: NAD  HEENT: head NCAT, no scleral icterus.   NECK: Supple. No LAD  LUNGS: Clear to auscultation. No wheezing or crackles  HEART: Regular rate and rhythm without murmur. 2+ pulses, no JVD, no peripheral edema. Bradycardic  ABDOMEN: Soft, non-distended, non-tender. Positive bowel sounds.  EXTREMITIES: Without any cyanosis or clubbing. Good muscle tone    Data Reviewed: Basic Metabolic Panel:  Recent Labs Lab 01/04/15 0220 01/04/15 0528 01/04/15 0529 01/05/15 0312 01/06/15 0213 01/07/15 0330  NA 135 136  --  132* 133* 131*  K 3.0* 4.1  --  5.3* 3.9 4.6  CL 95* 94*  --  91* 94* 90*  CO2 27 29  --  28 28 26   GLUCOSE 98 97  --  130* 188* 143*  BUN 26* 32*  --  53* 34* 59*  CREATININE 4.18* 5.78*  --  8.18* 5.49* 7.28*  CALCIUM 7.7* 7.5*  --  6.2* 7.1* 7.2*  MG  --   --  1.9  --   --   --   PHOS  --   --  6.4* 6.6* 3.6 3.7   Liver Function Tests:  Recent Labs Lab 01/03/15 1958 01/05/15 0312 01/06/15 0213 01/07/15 0330  AST 51*  --   --   --   ALT 56  --   --   --   ALKPHOS 161*  --   --   --   BILITOT 0.7  --   --   --   PROT 6.5  --   --   --   ALBUMIN 2.9* 2.9* 3.1* 3.1*   CBC:  Recent Labs Lab 01/03/15 1958 01/04/15 0529 01/05/15 0312 01/06/15 0213 01/07/15 0330  WBC 9.7 11.0* 12.0* 12.9* 16.2*  NEUTROABS 7.7  --   --   --   --   HGB 10.8* 10.6* 9.8* 10.5* 10.9*  HCT 33.8* 31.9* 31.0* 32.5* 34.1*  MCV 97.4 95.5 96.6 97.0 96.3  PLT 210 225 235 248 249   Cardiac Enzymes:  Recent Labs Lab 01/03/15 1929 01/03/15 1958 01/04/15 0220 01/04/15 0728  TROPONINI <0.03 <0.03 0.06* 0.06*   ProBNP (last 3 results)  Recent Labs  01/29/14 0748  PROBNP 7824.0*    CBG:  Recent  Labs Lab 01/06/15 1616 01/06/15 2116 01/07/15 0724 01/07/15 1112 01/07/15 1203  GLUCAP 252* 109* 117* 107* 101*    Recent Results (from the past 240 hour(s))  MRSA PCR Screening     Status: None   Collection Time: 01/03/15 11:23 PM  Result Value Ref Range Status   MRSA by PCR NEGATIVE NEGATIVE Final    Comment:        The GeneXpert MRSA Assay (FDA approved for NASAL specimens only), is one component  of a comprehensive MRSA colonization surveillance program. It is not intended to diagnose MRSA infection nor to guide or monitor treatment for MRSA infections.      Scheduled Meds: . amiodarone  200 mg Per Tube Daily  . aspirin  81 mg Oral Daily  . atorvastatin  80 mg Oral q1800  . collagenase   Topical Daily  . doxercalciferol  5 mcg Intravenous Q M,W,F-HD  . insulin aspart  0-9 Units Subcutaneous TID WC  . sevelamer carbonate  2,400 mg Oral TID WC  . sodium chloride  3 mL Intravenous Q12H  . sodium chloride  3 mL Intravenous Q12H   Continuous Infusions: . albuterol      Marzetta Board, MD [facility] Pager [phone]. If 7 PM - 7 AM, please contact night-coverage at [email or web address], password [facility] 01/07/2015, 12:37 PM  LOS: 4 days

## 2015-01-07 NOTE — Progress Notes (Signed)
Call from cath lab to get patient ready for cath lab.  Reported that his Right groin central line was removed s/p active bleeding this am and he has been on bedrest.  Dressing remains dry and intact.

## 2015-01-07 NOTE — Progress Notes (Signed)
Patient awake and lying in bed. Changed dressing to wound for the dressing fell off. Cleansed wound, applied Santynl, moistened gauze with normal saline applied, dry gauze applied over top and wrapped with kerlex. Patient complains of no pain at this time except for discomfort of his neuropathy but patient states does not want anything for it until he returns from dialysis. Will continue to monitor patient to end of shift.

## 2015-01-07 NOTE — Progress Notes (Signed)
Patient refusing CPAP for tonight. 

## 2015-01-07 NOTE — Progress Notes (Signed)
Paged attending doctor to notify that the site of insertion has started bleeding from the triple lumen central line. Patient had notified nurse and once in room applied pressure and IV team was paged. Doctor gave orders to continue to apply pressure and that was performed. Bleeding has slowed down after about 20-38min of pressure onto site. Attending doctor states he will be in to assess and evaluate central line for it may need to come out. IV team has arrived and I explained that doctor wants to evaluate first but if a peripheral IV could be inserted prior to determining if central line will come out. Patient has been calm and complains of no pain or discomfort. Will report off to oncoming nurse.

## 2015-01-08 LAB — GLUCOSE, CAPILLARY
GLUCOSE-CAPILLARY: 92 mg/dL (ref 65–99)
GLUCOSE-CAPILLARY: 100 mg/dL — AB (ref 65–99)
Glucose-Capillary: 141 mg/dL — ABNORMAL HIGH (ref 65–99)

## 2015-01-08 LAB — BASIC METABOLIC PANEL
ANION GAP: 11 (ref 5–15)
BUN: 78 mg/dL — ABNORMAL HIGH (ref 6–20)
CO2: 27 mmol/L (ref 22–32)
Calcium: 7 mg/dL — ABNORMAL LOW (ref 8.9–10.3)
Chloride: 89 mmol/L — ABNORMAL LOW (ref 101–111)
Creatinine, Ser: 9.19 mg/dL — ABNORMAL HIGH (ref 0.61–1.24)
GFR calc Af Amer: 6 mL/min — ABNORMAL LOW (ref >60)
GFR calc non Af Amer: 5 mL/min — ABNORMAL LOW (ref >60)
GLUCOSE: 102 mg/dL — AB (ref 65–99)
POTASSIUM: 4.9 mmol/L (ref 3.5–5.1)
Sodium: 127 mmol/L — ABNORMAL LOW (ref 135–145)

## 2015-01-08 LAB — RENAL FUNCTION PANEL
ALBUMIN: 2.5 g/dL — AB (ref 3.5–5.0)
Anion gap: 12 (ref 5–15)
BUN: 72 mg/dL — AB (ref 6–20)
CO2: 24 mmol/L (ref 22–32)
CREATININE: 8.56 mg/dL — AB (ref 0.61–1.24)
Calcium: 6.9 mg/dL — ABNORMAL LOW (ref 8.9–10.3)
Chloride: 89 mmol/L — ABNORMAL LOW (ref 101–111)
GFR calc Af Amer: 7 mL/min — ABNORMAL LOW (ref >60)
GFR, EST NON AFRICAN AMERICAN: 6 mL/min — AB (ref >60)
Glucose, Bld: 149 mg/dL — ABNORMAL HIGH (ref 65–99)
PHOSPHORUS: 4.8 mg/dL — AB (ref 2.5–4.6)
Potassium: 4.6 mmol/L (ref 3.5–5.1)
Sodium: 125 mmol/L — ABNORMAL LOW (ref 135–145)

## 2015-01-08 LAB — CBC
HCT: 30.8 % — ABNORMAL LOW (ref 39.0–52.0)
HEMATOCRIT: 32.8 % — AB (ref 39.0–52.0)
Hemoglobin: 11 g/dL — ABNORMAL LOW (ref 13.0–17.0)
Hemoglobin: 10.2 g/dL — ABNORMAL LOW (ref 13.0–17.0)
MCH: 32 pg (ref 26.0–34.0)
MCH: 31.4 pg (ref 26.0–34.0)
MCHC: 33.5 g/dL (ref 30.0–36.0)
MCHC: 33.1 g/dL (ref 30.0–36.0)
MCV: 95.3 fL (ref 78.0–100.0)
MCV: 94.8 fL (ref 78.0–100.0)
PLATELETS: 204 10*3/uL (ref 150–400)
Platelets: 227 10*3/uL (ref 150–400)
RBC: 3.44 MIL/uL — AB (ref 4.22–5.81)
RBC: 3.25 MIL/uL — ABNORMAL LOW (ref 4.22–5.81)
RDW: 16.2 % — AB (ref 11.5–15.5)
RDW: 16.2 % — AB (ref 11.5–15.5)
WBC: 11.7 10*3/uL — AB (ref 4.0–10.5)
WBC: 11.8 10*3/uL — AB (ref 4.0–10.5)

## 2015-01-08 LAB — PROTIME-INR
INR: 1.32 (ref 0.00–1.49)
Prothrombin Time: 16.5 seconds — ABNORMAL HIGH (ref 11.6–15.2)

## 2015-01-08 MED ORDER — WARFARIN SODIUM 3 MG PO TABS: 3.0000 mg | ORAL_TABLET | Freq: Once | ORAL | Status: DC

## 2015-01-08 MED ORDER — DOXERCALCIFEROL 4 MCG/2ML IV SOLN
5.0000 ug | Freq: Once | INTRAVENOUS | Status: AC
  Administered 2015-01-08: 5 ug via INTRAVENOUS

## 2015-01-08 MED ORDER — DOXERCALCIFEROL 4 MCG/2ML IV SOLN
INTRAVENOUS | Status: AC
  Administered 2015-01-08: 5 ug via INTRAVENOUS
  Filled 2015-01-08: qty 4

## 2015-01-08 MED ORDER — GLUCOSE BLOOD VI STRP: 1.0000 | ORAL_STRIP | Freq: Two times a day (BID) | Status: DC

## 2015-01-08 MED ORDER — OXYCODONE HCL 5 MG PO TABS
ORAL_TABLET | ORAL | Status: AC
  Filled 2015-01-08: qty 1

## 2015-01-08 MED ORDER — ALLOPURINOL 100 MG PO TABS: 100.0000 mg | ORAL_TABLET | Freq: Two times a day (BID) | ORAL | Status: DC

## 2015-01-08 MED ORDER — OXYCODONE HCL 5 MG PO TABS
5.0000 mg | ORAL_TABLET | Freq: Four times a day (QID) | ORAL | Status: DC | PRN
  Administered 2015-01-08 (×3): 5 mg via ORAL
  Filled 2015-01-08 (×2): qty 1

## 2015-01-08 MED ORDER — COLLAGENASE 250 UNIT/GM EX OINT: TOPICAL_OINTMENT | Freq: Every day | CUTANEOUS | Status: DC

## 2015-01-08 MED ORDER — HEPARIN SODIUM (PORCINE) 1000 UNIT/ML DIALYSIS
20.0000 [IU]/kg | INTRAMUSCULAR | Status: DC | PRN
  Administered 2015-01-08: 2200 [IU] via INTRAVENOUS_CENTRAL
  Filled 2015-01-08: qty 3

## 2015-01-08 MED ORDER — WARFARIN - PHARMACIST DOSING INPATIENT: Freq: Every day | Status: DC

## 2015-01-08 MED ORDER — ACETAMINOPHEN 325 MG PO TABS
ORAL_TABLET | ORAL | Status: AC
  Filled 2015-01-08: qty 2

## 2015-01-08 NOTE — Discharge Instructions (Signed)
Follow with PCP in 5-7 days  Please get a complete blood count and chemistry panel checked by your Primary MD at your next visit, and again as instructed by your Primary MD. Please get your medications reviewed and adjusted by your Primary MD.  Please request your Primary MD to go over all Hospital Tests and Procedure/Radiological results at the follow up, please get all Hospital records sent to your Prim MD by signing hospital release before you go home.  If you had Pneumonia of Lung problems at the Hospital: Please get a 2 view Chest X ray done in 6-8 weeks after hospital discharge or sooner if instructed by your Primary MD.  If you have Congestive Heart Failure: Please call your Cardiologist or Primary MD anytime you have any of the following symptoms:  1) 3 pound weight gain in 24 hours or 5 pounds in 1 week  2) shortness of breath, with or without a dry hacking cough  3) swelling in the hands, feet or stomach  4) if you have to sleep on extra pillows at night in order to breathe  Follow cardiac low salt diet and 1.5 lit/day fluid restriction.  If you have diabetes Accuchecks 4 times/day, Once in AM empty stomach and then before each meal. Log in all results and show them to your primary doctor at your next visit. If any glucose reading is under 80 or above 300 call your primary MD immediately.  If you have Seizure/Convulsions/Epilepsy: Please do not drive, operate heavy machinery, participate in activities at heights or participate in high speed sports until you have seen by Primary MD or a Neurologist and advised to do so again.  If you had Gastrointestinal Bleeding: Please ask your Primary MD to check a complete blood count within one week of discharge or at your next visit. Your endoscopic/colonoscopic biopsies that are pending at the time of discharge, will also need to followed by your Primary MD.  Get Medicines reviewed and adjusted. Please take all your medications with you  for your next visit with your Primary MD  Please request your Primary MD to go over all hospital tests and procedure/radiological results at the follow up, please ask your Primary MD to get all Hospital records sent to his/her office.  If you experience worsening of your admission symptoms, develop shortness of breath, life threatening emergency, suicidal or homicidal thoughts you must seek medical attention immediately by calling 911 or calling your MD immediately  if symptoms less severe.  You must read complete instructions/literature along with all the possible adverse reactions/side effects for all the Medicines you take and that have been prescribed to you. Take any new Medicines after you have completely understood and accpet all the possible adverse reactions/side effects.   Do not drive or operate heavy machinery when taking Pain medications.   Do not take more than prescribed Pain, Sleep and Anxiety Medications  Special Instructions: If you have smoked or chewed Tobacco  in the last 2 yrs please stop smoking, stop any regular Alcohol  and or any Recreational drug use.  Wear Seat belts while driving.  Please note You were cared for by a hospitalist during your hospital stay. If you have any questions about your discharge medications or the care you received while you were in the hospital after you are discharged, you can call the unit and asked to speak with the hospitalist on call if the hospitalist that took care of you is not available. Once you are  discharged, your primary care physician will handle any further medical issues. Please note that NO REFILLS for any discharge medications will be authorized once you are discharged, as it is imperative that you return to your primary care physician (or establish a relationship with a primary care physician if you do not have one) for your aftercare needs so that they can reassess your need for medications and monitor your lab values.  You  can reach the hospitalist office at phone [phone] or fax [phone]   If you do not have a primary care physician, you can call [phone] for a physician referral.  Activity: As tolerated with Full fall precautions use walker/cane & assistance as needed  Diet: renal  Disposition Home

## 2015-01-08 NOTE — Progress Notes (Signed)
Patient back from dialysis around 1315, alert oriented, denies pain, v/s stable, no shortness of breath. IV and tele d/c,dressing change to RLL. D/c instruction explain and given to the patient, all questions answer. Patient d/c home per order.

## 2015-01-08 NOTE — Progress Notes (Signed)
Patient states having chest pain and states its an achy pain and hurts a lot when he breaths. Patient states its from when they did chest compressions previously. Patient requesting something stronger than tylenol. Text paged on-call hospitalist. Awaiting return page and or written order. Will continue to monitor patient to end of shift.

## 2015-01-08 NOTE — Progress Notes (Signed)
ANTICOAGULATION CONSULT NOTE - Follow Up Consult  Pharmacy Consult for coumadin Indication: atrial fibrillation  No Known Allergies  Patient Measurements: Height: 5\' 9"  (175.3 cm) Weight: 250 lb 3.6 oz (113.5 kg) IBW/kg (Calculated) : 70.7 Heparin Dosing Weight:   Vital Signs: Temp: 98.4 F (36.9 C) (11/26 0858) Temp Source: Oral (11/26 0858) BP: 119/56 mmHg (11/26 1000) Pulse Rate: 70 (11/26 1000)  Labs:  Recent Labs  01/06/15 0213 01/07/15 0330 01/08/15 0418 01/08/15 0857  HGB 10.5* 10.9* 11.0* 10.2*  HCT 32.5* 34.1* 32.8* 30.8*  PLT 248 249 227 204  LABPROT 23.2* 16.8* 16.5*  --   INR 2.07* 1.36 1.32  --   CREATININE 5.49* 7.28* 9.19* 8.56*    Estimated Creatinine Clearance: 11.4 mL/min (by C-G formula based on Cr of 8.56).   Medications:  Scheduled:  . amiodarone  200 mg Per Tube Daily  . aspirin  81 mg Oral Daily  . atorvastatin  80 mg Oral q1800  . collagenase   Topical Daily  . doxercalciferol  5 mcg Intravenous Q M,W,F-HD  . insulin aspart  0-9 Units Subcutaneous TID WC  . sevelamer carbonate  2,400 mg Oral TID WC  . sodium chloride  3 mL Intravenous Q12H  . sodium chloride  3 mL Intravenous Q12H   Infusions:  . albuterol      Assessment: 59 yo male with hx of afib will be resumed on coumadin s/p cath yesterday (ok per cardiology).  Home dose 3 mg po daily except 4.5 mg on Thursdays; admitting INR was supratherapeutic.  Goal of Therapy:  INR 2-3 Monitor platelets by anticoagulation protocol: Yes   Plan:  - coumadin 3 mg po x1 - INR in am  Mourad Cwikla, Tsz-Yin 01/08/2015,10:21 AM

## 2015-01-08 NOTE — Progress Notes (Signed)
Patient ID: Daniel Whitney, male   DOB: 15-Nov-1955, 59 y.o.   MRN: [id number]  [facility] Progress Note    Subjective:   Patient was seen on dialysis and the procedure was supervised. BFR 400 Via L AVF BP is 123/63.  Patient appears to be tolerating treatment well.  Had chest pain last night at site where he had CPR.  Otherwise doing well.     Objective:   BP 123/63 mmHg  Pulse 72  Temp(Src) 98.9 F (37.2 C) (Oral)  Resp 18  Ht 5\' 9"  (1.753 m)  Wt 113.4 kg (250 lb)  BMI 36.90 kg/m2  SpO2 99%  Intake/Output: I/O last 3 completed shifts: In: 465 [P.O.:462; I.V.:3] Out: 0    Intake/Output this shift:    Weight change: 2.223 kg (4 lb 14.4 oz)  Physical Exam: Gen:WD Obese AAM in NAD CVS:no rub Resp:cta [id number] Ext:L AVF +T/B, 1+ edema right leg, 2 ulcers on lower, lateral part of calf with eschar in place.  Labs: BMET  Recent Labs Lab 01/03/15 1958 01/03/15 2143 01/04/15 0220 01/04/15 0528 01/04/15 0529 01/05/15 0312 01/06/15 0213 01/07/15 0330 01/08/15 0418  NA 130* 130* 135 136  --  132* 133* 131* 127*  K 7.0* >7.5* 3.0* 4.1  --  5.3* 3.9 4.6 4.9  CL 94* 93* 95* 94*  --  91* 94* 90* 89*  CO2 19* 20* 27 29  --  28 28 26 27   GLUCOSE 150* 138* 98 97  --  130* 188* 143* 102*  BUN 91* 93* 26* 32*  --  53* 34* 59* 78*  CREATININE 11.10* 11.33* 4.18* 5.78*  --  8.18* 5.49* 7.28* 9.19*  ALBUMIN 2.9*  --   --   --   --  2.9* 3.1* 3.1*  --   CALCIUM 8.8* 7.3* 7.7* 7.5*  --  6.2* 7.1* 7.2* 7.0*  PHOS  --   --   --   --  6.4* 6.6* 3.6 3.7  --    CBC  Recent Labs Lab 01/03/15 1958  01/05/15 0312 01/06/15 0213 01/07/15 0330 01/08/15 0418  WBC 9.7  < > 12.0* 12.9* 16.2* 11.7*  NEUTROABS 7.7  --   --   --   --   --   HGB 10.8*  < > 9.8* 10.5* 10.9* 11.0*  HCT 33.8*  < > 31.0* 32.5* 34.1* 32.8*  MCV 97.4  < > 96.6 97.0 96.3 95.3  PLT 210  < > 235 248 249 227  < > = values in this interval not displayed.  @IMGRELPRIORS @ Medications:    .  amiodarone  200 mg Per Tube Daily  . aspirin  81 mg Oral Daily  . atorvastatin  80 mg Oral q1800  . collagenase   Topical Daily  . doxercalciferol  5 mcg Intravenous Q M,W,F-HD  . insulin aspart  0-9 Units Subcutaneous TID WC  . sevelamer carbonate  2,400 mg Oral TID WC  . sodium chloride  3 mL Intravenous Q12H  . sodium chloride  3 mL Intravenous Q12H   [facility] ED[facility] 108 kg 4 hours BFR 450 DFR A 1.5 2K 2Ca Left AVF Mircera 100 Q2weeks (last dosed 11/16) Hectorol 5 mcg  Assessment/ Plan:   1. Bradycardic/Cardiac arrest - presumably related to severe hyperkalemia for this episode, however he had an asystolic arrest in July. Improved after HD. Cont with telemetry.  1. Appreciate cardiology evaluation due to cardiac events.  2. S/p cardiac cath without obstructive CAD  3. Medical management per cardiology.   2. Hyperkalemia- related to dietary indiscretion and nonadherence with HD schedule. Improved after HD. Cont to follow and stressed the importance of compliance with HD. 3. ESRD- plan for HD today then will get back on schedule 01/10/15.  4. Anemia: on Micera dosed last week. Continue to follow. 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 Restart binders and follow.  Low calcium, will restart hectoral and follow. 6. Nutrition: resume renal diet 7. Hypertension:stable to low today. Hold off on more HD and UF today due to borderline BP 8. Right leg ulcers- ?decubiti vs calciphylaxis. Wound care nurse following and starting therapy, however looks more edematous and inflamed but improving with wound care.  May need to perform deep tissue biopsy to r/o calciphylaxis.  9. Memory impairment- pt admits to worsening memory over the last several months. Consider imaging and neuro eval (could be done as an outpt). Did have some anoxic injury during his arrest in July. 10. 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 Appreciate cardiology evaluation.  Had chest pain last night but seems more musculoskeletal. 11. Hyponatremia- related to CHF will follow after UF with HD 12. A fib- on coumadin/amio PTA, pharmacy managing anticoagulation. 13. VDRF due to AMS and cardiac arrest. Extubated and doing better. 14. Disposition- ok for discharge to home after HD if he remains stable.  Donetta Potts, MD [place] Pager [phone] 01/08/2015, 8:43 AM

## 2015-01-08 NOTE — Discharge Summary (Signed)
Physician Discharge Summary  Daniel Whitney LMB:[id number] DOB: 02-02-1956 DOA: 01/03/2015  FEO:FHQR, Jeneen Rinks, MD  Admit date: 01/03/2015 Discharge date: 01/08/2015  Time spent: > 30 minutes  Recommendations for Outpatient Follow-up:  1. Follow up with Dr. Jenny Reichmann in 2-3 weeks 2. Outpatient HD  Discharge Diagnoses:  Active Problems:   Diabetes mellitus with neuropathy ([place])   Hypertension   ESRD (end stage renal disease) on dialysis [facility])   Cardiac arrest ([place])   Hyperkalemia   Acute on chronic combined systolic and diastolic heart failure ([place])   Chest pain  Discharge Condition: stable  Diet recommendation: renal, heart healthy  Filed Weights   01/07/15 0538 01/08/15 0616 01/08/15 0858  Weight: 111.177 kg (245 lb 1.6 oz) 113.4 kg (250 lb) 113.5 kg (250 lb 3.6 oz)    History of present illness:  See H&P, Labs, Consult and Test reports for all details in brief, patient is a 59 y.o. male who is [facility] WITNESS, with hx ESRD (M/W/F). He was scheduled for dialysis Mon 11/21; however, he felt bad so he rescheduled for Tues 11/22 (last session Fri 11/18). He was brought to [facility] ED 11/21 after he collapsed at home. In ED, found to have hyperkalemia and had 10 min PEA arrest during which he was intubated.  Hospital Course:  PEA / Cardiac arrest - This is his second episode this year, was hospitalized in 09/09/14 when he coded, he was resuscitated then cooled. Likely the setting of hyperkalemia as well as combined systolic and diastolic heart failure. Patient with recently diagnosed systolic heart failure, ejection fraction in 2022-09-09 was 20%. Repeat echo at this time around with an ejection fraction of 55-60%. Cardiology consulted and patient underwent cardiac cath on 11/25 without significant obstructive disease  Acute on chronic combined systolic and diastolic heart failure - Monitor patient telemetry, EF improved Atrial fibrillation, paroxysmal - patient's CHA2DS2-VASc Score for Stroke  Risk is 3, patient on amiodarone, resume Coumadin Pulmonary hypertension / OSA - non compliant with his CPAP DM - resume home regimen End-stage renal disease- Patient being followed by nephrology. Advised not to miss HD since he is sensitive to high K HTN HLD  Hyperkalemia - Likely in the setting of end-stage renal disease, dialysis per nephrology. 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 Wall thickness wasincreased in a pattern of moderate LVH. Systolic function wasnormal. The estimated ejection fraction was in the range of 55%to 60%. Left atrium: The atrium was moderately dilated.  Cardiac catheterization 11/25  Dist RCA lesion, 10% stenosed. No significant coronary obstructive disease with a normal left main, normal LAD and left circumflex coronary artery, and mild calcification of a dominant RCA without significant stenosis.   Consultations:  Nephrology   Cardiology   Discharge Exam: Filed Vitals:   01/08/15 1100 01/08/15 1130 01/08/15 1200 01/08/15 1230  BP: 114/63 127/60 118/62 105/63  Pulse: 72 70 76 72  Temp:      TempSrc:      Resp: '22 16 23 24  ' Height:      Weight:      SpO2:        General: NAD Cardiovascular: RRR Respiratory: CTA biL  Discharge Instructions Activity:  As tolerated   Get Medicines reviewed and adjusted: Please take all your  medications with you for your next visit with your Primary MD  Please request your Primary MD to go over all hospital tests and procedure/radiological results at the follow up, please ask your Primary MD to get all Hospital records sent to his/her office.  If you experience worsening of your admission symptoms, develop shortness of breath, life threatening emergency, suicidal or  homicidal thoughts you must seek medical attention immediately by calling 911 or calling your MD immediately if symptoms less severe.  You must read complete instructions/literature along with all the possible adverse reactions/side effects for all the Medicines you take and that have been prescribed to you. Take any new Medicines after you have completely understood and accpet all the possible adverse reactions/side effects.   Do not drive when taking Pain medications.   Do not take more than prescribed Pain, Sleep and Anxiety Medications  Special Instructions: If you have smoked or chewed Tobacco in the last 2 yrs please stop smoking, stop any regular Alcohol and or any Recreational drug use.  Wear Seat belts while driving.  Please note  You were cared for by a hospitalist during your hospital stay. Once you are discharged, your primary care physician will handle any further medical issues. Please note that NO REFILLS for any discharge medications will be authorized once you are discharged, as it is imperative that you return to your primary care physician (or establish a relationship with a primary care physician if you do not have one) for your aftercare needs so that they can reassess your need for medications and monitor your lab values.    Medication List    TAKE these medications        acetaminophen 650 MG CR tablet  Commonly known as:  TYLENOL  Take 650 mg by mouth every 8 (eight) hours as needed for pain.     allopurinol 100 MG tablet  Commonly known as:  ZYLOPRIM  TAKE ONE TABLET BY MOUTH TWICE DAILY     amiodarone 200 MG tablet  Commonly known as:  PACERONE  Take 1 tablet (200 mg total) by mouth daily.     amitriptyline 50 MG tablet  Commonly known as:  ELAVIL  TAKE ONE TABLET BY MOUTH ONCE DAILY AT BEDTIME     carvedilol 25 MG tablet  Commonly known as:  COREG  Take 25 mg by mouth 2 (two) times daily with a meal.     collagenase ointment  Commonly known as:   SANTYL  Apply topically daily.     docusate sodium 100 MG capsule  Commonly known as:  COLACE  Take 100 mg by mouth daily.     glucose blood test strip  Commonly known as:  ONE TOUCH ULTRA TEST  1 each by Other route 2 (two) times daily. Use to check blood sugars twice a day Dx E11.9     hydrOXYzine 25 MG tablet  Commonly known as:  ATARAX/VISTARIL  Take 1 tablet (25 mg total) by mouth 3 (three) times daily as needed for itching.     JANUVIA 25 MG tablet  Generic drug:  sitaGLIPtin  TAKE ONE TABLET BY MOUTH ONCE DAILY     loperamide 2 MG tablet  Commonly known as:  IMODIUM A-D  Take 2 mg by mouth 4 (four) times daily as needed for diarrhea or loose stools.     ondansetron 4 MG tablet  Commonly known as:  ZOFRAN  Take 1 tablet (4 mg total) by mouth every 6 (six) hours.  ONE TOUCH ULTRA 2 W/DEVICE Kit  Use to check blood sugars twice a day Dx E11.9     onetouch ultrasoft lancets  1 each by Other route 2 (two) times daily. Use to help check blood sugars twice a day Dx E11.9     ranitidine 150 MG tablet  Commonly known as:  ZANTAC  Take 150 mg by mouth daily as needed for heartburn.     sevelamer carbonate 800 MG tablet  Commonly known as:  RENVELA  Take 1,600 mg by mouth 3 (three) times daily with meals.     simvastatin 20 MG tablet  Commonly known as:  ZOCOR  TAKE ONE TABLET BY MOUTH ONCE DAILY     sodium chloride 0.65 % Soln nasal spray  Commonly known as:  OCEAN  Place 1 spray into both nostrils as needed for congestion.     tadalafil 20 MG tablet  Commonly known as:  CIALIS  Take 1 tablet (20 mg total) by mouth daily as needed for erectile dysfunction.     warfarin 3 MG tablet  Commonly known as:  COUMADIN  Please take as directed by anticoagulation clinic.           Follow-up Information    Follow up with PCP . Schedule an appointment as soon as possible for a visit in 2 weeks.      The results of significant diagnostics from this hospitalization  (including imaging, microbiology, ancillary and laboratory) are listed below for reference.    Significant Diagnostic Studies: Dg Chest 2 View  01/05/2015  CLINICAL DATA:  Right-sided chest pain. EXAM: CHEST  2 VIEW COMPARISON:  01/04/2015. FINDINGS: Mediastinum and hilar structures are normal. Cardiomegaly with pulmonary vascular prominence and interstitial prominence consistent with congestive heart failure. No pleural effusion or pneumothorax. No acute bony abnormality. IMPRESSION: Congestive heart failure with mild pulmonary interstitial edema . Electronically Signed   By: Marcello Moores  Register   On: 01/05/2015 16:43   Dg Chest Port 1 View  01/04/2015  CLINICAL DATA:  Respiratory failure EXAM: PORTABLE CHEST 1 VIEW COMPARISON:  January 03, 2015 FINDINGS: Endotracheal tube and nasogastric tube have been removed. No pneumothorax. Patchy opacity in the lingula and perihilar regions remains without change. No new opacity. Heart is upper normal in size with pulmonary vascularity within normal limits. No adenopathy. IMPRESSION: No pneumothorax. Patchy airspace disease in the lingula and bilateral perihilar regions remains stable. No new opacity. No change in cardiac silhouette. Electronically Signed   By: Lowella Grip III M.D.   On: 01/04/2015 07:06   Dg Chest Portable 1 View  01/03/2015  CLINICAL DATA:  Endotracheal tube placement EXAM: PORTABLE CHEST 1 VIEW COMPARISON:  11/08/2014 FINDINGS: Endotracheal tube tip is 3.6 cm above the carina, well positioned. Nasogastric tube extends into the stomach. Moderate to prominent enlargement of the cardiopericardial silhouette observed. There has been some clearing of the interstitial accentuation although patchy airspace opacities in the lingula and perihilar regions are again observed. Low lung volumes are present, causing crowding of the pulmonary vasculature. No pleural effusion identified. IMPRESSION: 1. Well-positioned endotracheal tube, about 3.6 cm above  the carina. Nasogastric tube enters the stomach. 2. Slightly improved aeration although there is some bandlike opacities in the lingula and perihilar regions persisting. 3. Low lung volumes. Electronically Signed   By: Van Clines M.D.   On: 01/03/2015 20:31    Microbiology: Recent Results (from the past 240 hour(s))  MRSA PCR Screening     Status: None  Collection Time: 01/03/15 11:23 PM  Result Value Ref Range Status   MRSA by PCR NEGATIVE NEGATIVE Final    Comment:        The GeneXpert MRSA Assay (FDA approved for NASAL specimens only), is one component of a comprehensive MRSA colonization surveillance program. It is not intended to diagnose MRSA infection nor to guide or monitor treatment for MRSA infections.      Labs: Basic Metabolic Panel:  Recent Labs Lab 01/03/15 1927 01/03/15 1958 01/03/15 2143 01/04/15 0220 01/04/15 1030 01/04/15 0529 01/05/15 0312 01/06/15 0213 01/07/15 0330 01/08/15 0418 01/08/15 0857  NA 128* 130* 130* 135 136  --  132* 133* 131* 127* 125*  K 6.4* 7.0* >7.5* 3.0* 4.1  --  5.3* 3.9 4.6 4.9 4.6  CL 97* 94* 93* 95* 94*  --  91* 94* 90* 89* 89*  CO2  --  19* 20* 27 29  --  '28 28 26 27 24  ' GLUCOSE 150* 150* 138* 98 97  --  130* 188* 143* 102* 149*  BUN 96* 91* 93* 26* 32*  --  53* 34* 59* 78* 72*  CREATININE 10.70* 11.10* 11.33* 4.18* 5.78*  --  8.18* 5.49* 7.28* 9.19* 8.56*  CALCIUM  --  8.8* 7.3* 7.7* 7.5*  --  6.2* 7.1* 7.2* 7.0* 6.9*  MG  --   --   --   --   --  1.9  --   --   --   --   --   PHOS  --   --   --   --   --  6.4* 6.6* 3.6 3.7  --  4.8*   Liver Function Tests:  Recent Labs Lab 01/03/15 1958 01/05/15 0312 01/06/15 0213 01/07/15 0330 01/08/15 0857  AST 51*  --   --   --   --   ALT 56  --   --   --   --   ALKPHOS 161*  --   --   --   --   BILITOT 0.7  --   --   --   --   PROT 6.5  --   --   --   --   ALBUMIN 2.9* 2.9* 3.1* 3.1* 2.5*   CBC:  Recent Labs Lab 01/03/15 1958  01/05/15 0312 01/06/15 0213  01/07/15 0330 01/08/15 0418 01/08/15 0857  WBC 9.7  < > 12.0* 12.9* 16.2* 11.7* 11.8*  NEUTROABS 7.7  --   --   --   --   --   --   HGB 10.8*  < > 9.8* 10.5* 10.9* 11.0* 10.2*  HCT 33.8*  < > 31.0* 32.5* 34.1* 32.8* 30.8*  MCV 97.4  < > 96.6 97.0 96.3 95.3 94.8  PLT 210  < > 235 248 249 227 204  < > = values in this interval not displayed. Cardiac Enzymes:  Recent Labs Lab 01/03/15 1929 01/03/15 1958 01/04/15 0220 01/04/15 0728  TROPONINI <0.03 <0.03 0.06* 0.06*     ProBNP (last 3 results)  Recent Labs  01/29/14 0748  PROBNP 7824.0*    CBG:  Recent Labs Lab 01/07/15 1112 01/07/15 1203 01/07/15 1622 01/08/15 0033 01/08/15 0741  GLUCAP 107* 101* 170* 141* 92   Signed:  GHERGHE, COSTIN  [facility] 01/08/2015, 1:30 PM

## 2015-01-08 NOTE — Progress Notes (Signed)
Patient ID: Daniel Whitney, male   DOB: March 04, 1955, 59 y.o.   MRN: [id number]  Previous notes reviewed. Fom notes multiple episodes typically associated with missing dialysis. This admit PEA arrest after presenting hyperkalemic after missing HD. Due to recurrent episodes and CAD risk factors he was sent for cath. Cath 01/07/15 with patent coronaries, echo 12/2014 LVEF 55-60% (prior hx of LV dysfunction now resolved). No further cardiac testing or interventions planned at this time. Please resume coumadin for his afib. We will sign off inpatient care, at discharge he will need f/u with Dr Debara Pickett in 6 weeks.        Carlyle Dolly, M.D.

## 2015-01-08 NOTE — Progress Notes (Signed)
Orders were given for pain med - Oxy IR and were administered per order two times during shift. Patient states pain medication was effective as the night went on. Report given to oncoming nurse. Nurse signing off at this time.

## 2015-01-10 ENCOUNTER — Encounter (HOSPITAL_COMMUNITY): Payer: Self-pay | Admitting: Cardiovascular Disease

## 2015-01-10 DIAGNOSIS — D509 Iron deficiency anemia, unspecified: Secondary | ICD-10-CM | POA: Diagnosis not present

## 2015-01-10 DIAGNOSIS — N186 End stage renal disease: Secondary | ICD-10-CM | POA: Diagnosis not present

## 2015-01-10 DIAGNOSIS — D631 Anemia in chronic kidney disease: Secondary | ICD-10-CM | POA: Diagnosis not present

## 2015-01-10 DIAGNOSIS — I4891 Unspecified atrial fibrillation: Secondary | ICD-10-CM | POA: Diagnosis not present

## 2015-01-10 DIAGNOSIS — N2581 Secondary hyperparathyroidism of renal origin: Secondary | ICD-10-CM | POA: Diagnosis not present

## 2015-01-10 DIAGNOSIS — E1129 Type 2 diabetes mellitus with other diabetic kidney complication: Secondary | ICD-10-CM | POA: Diagnosis not present

## 2015-01-10 DIAGNOSIS — E876 Hypokalemia: Secondary | ICD-10-CM | POA: Diagnosis not present

## 2015-01-11 DIAGNOSIS — N186 End stage renal disease: Secondary | ICD-10-CM | POA: Diagnosis not present

## 2015-01-11 DIAGNOSIS — E8779 Other fluid overload: Secondary | ICD-10-CM | POA: Diagnosis not present

## 2015-01-12 DIAGNOSIS — I120 Hypertensive chronic kidney disease with stage 5 chronic kidney disease or end stage renal disease: Secondary | ICD-10-CM | POA: Diagnosis not present

## 2015-01-12 DIAGNOSIS — D509 Iron deficiency anemia, unspecified: Secondary | ICD-10-CM | POA: Diagnosis not present

## 2015-01-12 DIAGNOSIS — E1129 Type 2 diabetes mellitus with other diabetic kidney complication: Secondary | ICD-10-CM | POA: Diagnosis not present

## 2015-01-12 DIAGNOSIS — N186 End stage renal disease: Secondary | ICD-10-CM | POA: Diagnosis not present

## 2015-01-12 DIAGNOSIS — E876 Hypokalemia: Secondary | ICD-10-CM | POA: Diagnosis not present

## 2015-01-12 DIAGNOSIS — N2581 Secondary hyperparathyroidism of renal origin: Secondary | ICD-10-CM | POA: Diagnosis not present

## 2015-01-12 DIAGNOSIS — Z992 Dependence on renal dialysis: Secondary | ICD-10-CM | POA: Diagnosis not present

## 2015-01-12 DIAGNOSIS — D631 Anemia in chronic kidney disease: Secondary | ICD-10-CM | POA: Diagnosis not present

## 2015-01-14 ENCOUNTER — Ambulatory Visit: Payer: Self-pay | Admitting: General Practice

## 2015-01-14 DIAGNOSIS — N186 End stage renal disease: Secondary | ICD-10-CM | POA: Diagnosis not present

## 2015-01-14 DIAGNOSIS — D509 Iron deficiency anemia, unspecified: Secondary | ICD-10-CM | POA: Diagnosis not present

## 2015-01-14 DIAGNOSIS — E876 Hypokalemia: Secondary | ICD-10-CM | POA: Diagnosis not present

## 2015-01-14 DIAGNOSIS — D631 Anemia in chronic kidney disease: Secondary | ICD-10-CM | POA: Diagnosis not present

## 2015-01-14 DIAGNOSIS — E1129 Type 2 diabetes mellitus with other diabetic kidney complication: Secondary | ICD-10-CM | POA: Diagnosis not present

## 2015-01-14 DIAGNOSIS — L03115 Cellulitis of right lower limb: Secondary | ICD-10-CM | POA: Diagnosis not present

## 2015-01-14 DIAGNOSIS — N2581 Secondary hyperparathyroidism of renal origin: Secondary | ICD-10-CM | POA: Diagnosis not present

## 2015-01-17 ENCOUNTER — Encounter (HOSPITAL_BASED_OUTPATIENT_CLINIC_OR_DEPARTMENT_OTHER): Payer: Medicare Other | Attending: Plastic Surgery

## 2015-01-17 DIAGNOSIS — R600 Localized edema: Secondary | ICD-10-CM | POA: Diagnosis not present

## 2015-01-17 DIAGNOSIS — I878 Other specified disorders of veins: Secondary | ICD-10-CM | POA: Diagnosis not present

## 2015-01-17 DIAGNOSIS — M1990 Unspecified osteoarthritis, unspecified site: Secondary | ICD-10-CM | POA: Insufficient documentation

## 2015-01-17 DIAGNOSIS — K219 Gastro-esophageal reflux disease without esophagitis: Secondary | ICD-10-CM | POA: Diagnosis not present

## 2015-01-17 DIAGNOSIS — L03115 Cellulitis of right lower limb: Secondary | ICD-10-CM | POA: Insufficient documentation

## 2015-01-17 DIAGNOSIS — I509 Heart failure, unspecified: Secondary | ICD-10-CM | POA: Insufficient documentation

## 2015-01-17 DIAGNOSIS — E11319 Type 2 diabetes mellitus with unspecified diabetic retinopathy without macular edema: Secondary | ICD-10-CM | POA: Insufficient documentation

## 2015-01-17 DIAGNOSIS — E1140 Type 2 diabetes mellitus with diabetic neuropathy, unspecified: Secondary | ICD-10-CM | POA: Diagnosis not present

## 2015-01-17 DIAGNOSIS — I132 Hypertensive heart and chronic kidney disease with heart failure and with stage 5 chronic kidney disease, or end stage renal disease: Secondary | ICD-10-CM | POA: Diagnosis not present

## 2015-01-17 DIAGNOSIS — E1129 Type 2 diabetes mellitus with other diabetic kidney complication: Secondary | ICD-10-CM | POA: Diagnosis not present

## 2015-01-17 DIAGNOSIS — M109 Gout, unspecified: Secondary | ICD-10-CM | POA: Insufficient documentation

## 2015-01-17 DIAGNOSIS — Z8673 Personal history of transient ischemic attack (TIA), and cerebral infarction without residual deficits: Secondary | ICD-10-CM | POA: Diagnosis not present

## 2015-01-17 DIAGNOSIS — H409 Unspecified glaucoma: Secondary | ICD-10-CM | POA: Diagnosis not present

## 2015-01-17 DIAGNOSIS — Z6836 Body mass index (BMI) 36.0-36.9, adult: Secondary | ICD-10-CM | POA: Insufficient documentation

## 2015-01-17 DIAGNOSIS — L97212 Non-pressure chronic ulcer of right calf with fat layer exposed: Secondary | ICD-10-CM | POA: Insufficient documentation

## 2015-01-17 DIAGNOSIS — I252 Old myocardial infarction: Secondary | ICD-10-CM | POA: Diagnosis not present

## 2015-01-17 DIAGNOSIS — Z992 Dependence on renal dialysis: Secondary | ICD-10-CM | POA: Insufficient documentation

## 2015-01-17 DIAGNOSIS — D631 Anemia in chronic kidney disease: Secondary | ICD-10-CM | POA: Diagnosis not present

## 2015-01-17 DIAGNOSIS — N186 End stage renal disease: Secondary | ICD-10-CM | POA: Diagnosis not present

## 2015-01-17 DIAGNOSIS — G4730 Sleep apnea, unspecified: Secondary | ICD-10-CM | POA: Diagnosis not present

## 2015-01-17 DIAGNOSIS — N2581 Secondary hyperparathyroidism of renal origin: Secondary | ICD-10-CM | POA: Diagnosis not present

## 2015-01-17 DIAGNOSIS — E11622 Type 2 diabetes mellitus with other skin ulcer: Secondary | ICD-10-CM | POA: Diagnosis not present

## 2015-01-17 DIAGNOSIS — D509 Iron deficiency anemia, unspecified: Secondary | ICD-10-CM | POA: Diagnosis not present

## 2015-01-17 DIAGNOSIS — E1122 Type 2 diabetes mellitus with diabetic chronic kidney disease: Secondary | ICD-10-CM | POA: Diagnosis not present

## 2015-01-19 ENCOUNTER — Other Ambulatory Visit: Payer: Self-pay | Admitting: Internal Medicine

## 2015-01-19 ENCOUNTER — Other Ambulatory Visit: Payer: Self-pay | Admitting: Family Medicine

## 2015-01-19 DIAGNOSIS — D631 Anemia in chronic kidney disease: Secondary | ICD-10-CM | POA: Diagnosis not present

## 2015-01-19 DIAGNOSIS — L03115 Cellulitis of right lower limb: Secondary | ICD-10-CM | POA: Diagnosis not present

## 2015-01-19 DIAGNOSIS — D509 Iron deficiency anemia, unspecified: Secondary | ICD-10-CM | POA: Diagnosis not present

## 2015-01-19 DIAGNOSIS — E1129 Type 2 diabetes mellitus with other diabetic kidney complication: Secondary | ICD-10-CM | POA: Diagnosis not present

## 2015-01-19 DIAGNOSIS — N2581 Secondary hyperparathyroidism of renal origin: Secondary | ICD-10-CM | POA: Diagnosis not present

## 2015-01-19 DIAGNOSIS — N186 End stage renal disease: Secondary | ICD-10-CM | POA: Diagnosis not present

## 2015-01-19 NOTE — Telephone Encounter (Signed)
Refill done.  

## 2015-01-21 DIAGNOSIS — E1129 Type 2 diabetes mellitus with other diabetic kidney complication: Secondary | ICD-10-CM | POA: Diagnosis not present

## 2015-01-21 DIAGNOSIS — N186 End stage renal disease: Secondary | ICD-10-CM | POA: Diagnosis not present

## 2015-01-21 DIAGNOSIS — D509 Iron deficiency anemia, unspecified: Secondary | ICD-10-CM | POA: Diagnosis not present

## 2015-01-21 DIAGNOSIS — N2581 Secondary hyperparathyroidism of renal origin: Secondary | ICD-10-CM | POA: Diagnosis not present

## 2015-01-21 DIAGNOSIS — D631 Anemia in chronic kidney disease: Secondary | ICD-10-CM | POA: Diagnosis not present

## 2015-01-21 DIAGNOSIS — L03115 Cellulitis of right lower limb: Secondary | ICD-10-CM | POA: Diagnosis not present

## 2015-01-24 ENCOUNTER — Encounter: Payer: Self-pay | Admitting: Physician Assistant

## 2015-01-24 ENCOUNTER — Ambulatory Visit (INDEPENDENT_AMBULATORY_CARE_PROVIDER_SITE_OTHER): Payer: Medicare Other | Admitting: Physician Assistant

## 2015-01-24 VITALS — BP 136/88 | HR 110 | Ht 69.0 in | Wt 250.6 lb

## 2015-01-24 DIAGNOSIS — E1122 Type 2 diabetes mellitus with diabetic chronic kidney disease: Secondary | ICD-10-CM | POA: Diagnosis not present

## 2015-01-24 DIAGNOSIS — Z7901 Long term (current) use of anticoagulants: Secondary | ICD-10-CM

## 2015-01-24 DIAGNOSIS — I484 Atypical atrial flutter: Secondary | ICD-10-CM | POA: Insufficient documentation

## 2015-01-24 DIAGNOSIS — L03115 Cellulitis of right lower limb: Secondary | ICD-10-CM | POA: Diagnosis not present

## 2015-01-24 DIAGNOSIS — L97212 Non-pressure chronic ulcer of right calf with fat layer exposed: Secondary | ICD-10-CM | POA: Diagnosis not present

## 2015-01-24 DIAGNOSIS — I10 Essential (primary) hypertension: Secondary | ICD-10-CM

## 2015-01-24 DIAGNOSIS — D509 Iron deficiency anemia, unspecified: Secondary | ICD-10-CM | POA: Diagnosis not present

## 2015-01-24 DIAGNOSIS — I639 Cerebral infarction, unspecified: Secondary | ICD-10-CM

## 2015-01-24 DIAGNOSIS — G4730 Sleep apnea, unspecified: Secondary | ICD-10-CM | POA: Diagnosis not present

## 2015-01-24 DIAGNOSIS — N186 End stage renal disease: Secondary | ICD-10-CM | POA: Diagnosis not present

## 2015-01-24 DIAGNOSIS — D631 Anemia in chronic kidney disease: Secondary | ICD-10-CM | POA: Diagnosis not present

## 2015-01-24 DIAGNOSIS — E1129 Type 2 diabetes mellitus with other diabetic kidney complication: Secondary | ICD-10-CM | POA: Diagnosis not present

## 2015-01-24 DIAGNOSIS — E11622 Type 2 diabetes mellitus with other skin ulcer: Secondary | ICD-10-CM | POA: Diagnosis not present

## 2015-01-24 DIAGNOSIS — N2581 Secondary hyperparathyroidism of renal origin: Secondary | ICD-10-CM | POA: Diagnosis not present

## 2015-01-24 NOTE — Patient Instructions (Signed)
Your physician has recommended you make the following change in your medication:   CONTINUE CARVEDILOL 25 MG TWICE DAILY EXCEPT TAKE 1/2 TABLET THE NIGHT BEFORE AND THE MORNING OF DIALYSIS.  Your physician recommends that you schedule a follow-up appointment in: [place] DR. HILTY.

## 2015-01-24 NOTE — Progress Notes (Signed)
Cardiology Office Note   Date:  01/24/2015   ID:  Daniel Whitney, DOB 1955-04-04, MRN [id number]  PCP:  Oliver Barre, MD  Cardiologist:  Dr Weston Anna, PA-C   Chief Complaint  Patient presents with  . Follow-up    post hosp//pt states he got lightheaded and SOB when his BP dropped last Friday  . Edema    bilateral ankles//wound on right calf    History of Present Illness: Daniel Whitney is a 59 y.o. male with a history of ESRD on HD, DM, HTN, HL, paroxysmal atrial fibrillation/flutter on amiodarone and Coumadin, CVA, EF nl 2015.   Admitted 06/2014 after cardiac arrest (had missed HD). Presenting rhythm was asystole, VT seen in the ER, electrolyte abnormalities.  Admitted 11/21-11/26 w/ cardiac arrest after missing HD treatments. PEA was presenting rhythm and his initial potassium was 7.  Cath with patent coronaries. Echo that admission showed an EF of 55-60 percent.   Daniel Whitney presents for post hospital follow-up. He has had some problems with low blood pressure during dialysis. He is very determined not to miss dialysis anymore and states that if he does not feel well enough to go to dialysis, he will just call 911.    Other than the low blood pressure during dialysis, he is feeling very well. He denies chest pain. He just had dialysis so he has no shortness of breath. He is determined to lose some weight and get healthier. His heart rate and blood pressure are elevated today but he states this is because he has not taken his Coreg. Coreg was not on the medication list we pulled up in the computer today, but the patient states he has been taking it except for last night and this morning because of dialysis. Coreg 25 mg twice a day was on his discharge med list, but it is not currently. There may have been removed by the nephrology team because of hypotension during dialysis. The patient is unsure.   He states he is in atrial fibrillation or atrial flutter all the time, but  this is not true based on review of telemetry records and ECGs.  Past Medical History  Diagnosis Date  . Glaucoma   . Hypertension   . Hyperlipidemia   . Atrial fibrillation (HCC) 12/30/2012  . CHF (congestive heart failure) (HCC) 12/30/2012  . Diabetic retinopathy (HCC) 12/30/2012  . Morbid obesity (HCC) 12/30/2012  . Pulmonary hypertension (HCC) 12/30/2012  . Myocardial infarction (HCC)     "I've had ~ 3; last one was in ~ 10/2013" (01/30/2014)  . Diabetes mellitus with neuropathy (HCC)   . OSA (obstructive sleep apnea) 12/30/2012    "lost weight; no longer needed CPAP; retested said I needed it; didn't followup cause I was feeling fine" (01/30/2014)  . GERD (gastroesophageal reflux disease)   . Stroke [facility]) ~ 2005; ~ 2005    "they were mild; I didn't even notice I'd had them"; denies residual on 01/30/2014  . History of gout     "right big toe"  . Arthritis     "all over"  . Chronic lower back pain   . ESRD (end stage renal disease) on dialysis (HCC) started in 2013    MWF; [facility]; [employer]  . Refusal of blood transfusions as patient is Jehovah's Witness   . Anemia due to other cause 07/07/2014  . Allergic rhinitis 07/07/2014  . Hypothyroid     Past Surgical History  Procedure Laterality Date  .  Total hip arthroplasty Right 1980  . Av fistula placement Left ~ 10/2013    "forearm"  . Closed reduction hip dislocation Right 1970's  . Cardiac catheterization    . Cardiac catheterization N/A 01/07/2015    Procedure: Left Heart Cath and Coronary Angiography;  Surgeon: Troy Sine, MD;  Location: [place] [facility];  Service: Cardiovascular;  Laterality: N/A;    Current Outpatient Prescriptions  Medication Sig Dispense Refill  . acetaminophen (TYLENOL) 650 MG CR tablet Take 650 mg by mouth every 8 (eight) hours as needed for pain.    Marland Kitchen allopurinol (ZYLOPRIM) 100 MG tablet Take 1 tablet (100 mg total) by mouth 2 (two) times daily. 60 tablet 1  . allopurinol  (ZYLOPRIM) 100 MG tablet TAKE ONE TABLET BY MOUTH TWICE DAILY 180 tablet 1  . amiodarone (PACERONE) 200 MG tablet Take 1 tablet (200 mg total) by mouth daily. 90 tablet 1  . amitriptyline (ELAVIL) 50 MG tablet TAKE ONE TABLET BY MOUTH ONCE DAILY AT BEDTIME 90 tablet 0  . Blood Glucose Monitoring Suppl (ONE TOUCH ULTRA 2) W/DEVICE KIT Use to check blood sugars twice a day Dx E11.9 1 each 0  . carvedilol (COREG) 25 MG tablet Take 25 mg by mouth 2 (two) times daily with a meal. TAKE 1/2 TABLET THE NIGHT BEFORE AND MORNING OF DIALYSIS    . collagenase (SANTYL) ointment Apply topically daily. 15 g 1  . glucose blood (ONE TOUCH ULTRA TEST) test strip 1 each by Other route 2 (two) times daily. Use to check blood sugars twice a day Dx E11.9 100 each 3  . JANUVIA 25 MG tablet TAKE ONE TABLET BY MOUTH ONCE DAILY 90 tablet 0  . Lancets (ONETOUCH ULTRASOFT) lancets 1 each by Other route 2 (two) times daily. Use to help check blood sugars twice a day Dx E11.9 100 each 3  . loperamide (IMODIUM A-D) 2 MG tablet Take 2 mg by mouth 4 (four) times daily as needed for diarrhea or loose stools.    . ondansetron (ZOFRAN) 4 MG tablet Take 1 tablet (4 mg total) by mouth every 6 (six) hours. (Patient taking differently: Take 4 mg by mouth every 6 (six) hours as needed for nausea or vomiting. ) 30 tablet 0  . ranitidine (ZANTAC) 150 MG tablet Take 150 mg by mouth daily as needed for heartburn.    . sevelamer carbonate (RENVELA) 800 MG tablet Take 1,600 mg by mouth 3 (three) times daily with meals.     . simvastatin (ZOCOR) 20 MG tablet TAKE ONE TABLET BY MOUTH ONCE DAILY 90 tablet 0  . sodium chloride (OCEAN) 0.65 % SOLN nasal spray Place 1 spray into both nostrils as needed for congestion. 104 mL 0  . tadalafil (CIALIS) 20 MG tablet Take 1 tablet (20 mg total) by mouth daily as needed for erectile dysfunction. 5 tablet 11  . warfarin (COUMADIN) 3 MG tablet Please take as directed by anticoagulation clinic. (Patient  taking differently: Take 3-4.5 mg by mouth daily at 6 PM. Takes 4.$RemoveBef'5mg'QADpNIRLtD$  on thu only Takes $RemoveBe'3mg'lgZZMHUqh$  all other days) 90 tablet 0   No current facility-administered medications for this visit.    Allergies:   Review of patient's allergies indicates no known allergies.    Social History:  The patient  reports that he has never smoked. He has never used smokeless tobacco. He reports that he drinks about 1.5 - 2.0 oz of alcohol per week. He reports that he does not use illicit drugs.  Family History:  The patient's family history includes Arthritis in his other; Diabetes in his other; Heart disease in his other; Hyperlipidemia in his other; Hypertension in his other; Kidney disease in his other; Stroke in his other.    ROS:  Please see the history of present illness. All other systems are reviewed and negative.    PHYSICAL EXAM: VS:  BP 136/88 mmHg  Pulse 110  Ht $R'5\' 9"'sY$  (1.753 m)  Wt 250 lb 9.6 oz (113.671 kg)  BMI 36.99 kg/m2 , BMI Body mass index is 36.99 kg/(m^2). GEN: Well nourished, well developed, male in no acute distress HEENT: normal for age  Neck: Minimal JVD, no carotid bruit, no masses Cardiac: Slightly Irregular rate and rhythm; no murmur, no rubs, or gallops Respiratory: Few rales bases bilaterally, normal work of breathing GI: soft, nontender, nondistended, + BS MS: no deformity or atrophy; no edema; distal pulses are 2+ in all 4 extremities; wound is bandaged and dressed, not disturbed  Skin: warm and dry, no rash Neuro:  Strength and sensation are intact Psych: euthymic mood, full affect   EKG:  EKG is ordered today. The ekg ordered today demonstrates atrial flutter with variable AV block, rate 110    Recent Labs: 01/29/2014: Pro B Natriuretic peptide (BNP) 7824.0* 08/13/2014: TSH 0.065* 01/03/2015: ALT 56 01/04/2015: Magnesium 1.9 01/08/2015: BUN 72*; Creatinine, Ser 8.56*; Hemoglobin 10.2*; Platelets 204; Potassium 4.6; Sodium 125*    Lipid Panel    Component  Value Date/Time   CHOL 92 07/07/2014 1241   TRIG 126 01/03/2015 1959   HDL 38.10* 07/07/2014 1241   CHOLHDL 2 07/07/2014 1241   VLDL 16.6 07/07/2014 1241   LDLCALC 37 07/07/2014 1241     Wt Readings from Last 3 Encounters:  01/24/15 250 lb 9.6 oz (113.671 kg)  01/08/15 240 lb 4.8 oz (109 kg)  10/13/14 247 lb (112.038 kg)     Other studies Reviewed: Additional studies/ records that were reviewed today include: hospital records, ECGs and office notes.  ASSESSMENT AND PLAN:  1.  Atrial flutter: This is not a normal rhythm for him. He has several EKGs in atrial fibrillation but seems to spend most of his time in sinus rhythm. He has missed 2 doses of his carvedilol which he did deliberately because of hypotension during dialysis. His heart rate is not that well controlled right now. He is compliant with his anticoagulation.   He is to continue the amiodarone at current dose. He is to continue the Coreg at 25 mg twice a day but the night before and the morning of dialysis, he is to take a half a tablet. Hopefully this will help him to maintain sinus rhythm. He is not symptomatic from the atrial flutter and feels well today,  So no further interventions at this time. Contact [facility] on Wednesday to see if he is still in atrial flutter.  2. Chronic anticoagulation: He is to continue Coumadin  Current medicines are reviewed at length with the patient today.  The patient has concerns regarding medicines. Concerns were addressed  The following changes have been made: Put carvedilol back on his medication list with changes as described above.   Labs/ tests ordered today include:  No orders of the defined types were placed in this encounter.     Disposition:   FU with  Dr. Debara Pickett  Signed, Lenoard Aden  01/24/2015 4:37 PM    [address], [place], [place]  [address] Phone: [phone]; Fax: [phone]

## 2015-01-25 ENCOUNTER — Ambulatory Visit (INDEPENDENT_AMBULATORY_CARE_PROVIDER_SITE_OTHER): Payer: Medicare Other | Admitting: Family Medicine

## 2015-01-25 ENCOUNTER — Ambulatory Visit: Payer: Medicare Other | Admitting: Family Medicine

## 2015-01-25 ENCOUNTER — Encounter: Payer: Self-pay | Admitting: Family Medicine

## 2015-01-25 ENCOUNTER — Other Ambulatory Visit: Payer: Self-pay | Admitting: Internal Medicine

## 2015-01-25 VITALS — BP 107/63 | HR 86 | Ht 69.0 in | Wt 245.0 lb

## 2015-01-25 DIAGNOSIS — M25562 Pain in left knee: Secondary | ICD-10-CM | POA: Diagnosis not present

## 2015-01-25 DIAGNOSIS — I639 Cerebral infarction, unspecified: Secondary | ICD-10-CM | POA: Diagnosis not present

## 2015-01-25 DIAGNOSIS — M25561 Pain in right knee: Secondary | ICD-10-CM | POA: Diagnosis present

## 2015-01-25 MED ORDER — METHYLPREDNISOLONE ACETATE 40 MG/ML IJ SUSP
40.0000 mg | Freq: Once | INTRAMUSCULAR | Status: AC
  Administered 2015-01-25: 40 mg via INTRA_ARTICULAR

## 2015-01-25 NOTE — Patient Instructions (Signed)
Your bilateral knee pain is due to severe arthritis. These are the medicines you can take for this: Tylenol 500mg  1-2 tabs three times a day for pain. Glucosamine sulfate 750mg  twice a day is a supplement that may help. Capsaicin, aspercreme, or biofreeze topically up to four times a day may also help with pain. Cortisone injections are an option - you were given these today. If cortisone injections do not help, there are different types of shots that may help but they take longer to take effect. It's important that you continue to stay active. Straight leg raises, knee extensions 3 sets of 10 once a day (add ankle weight if these become too easy). Consider physical therapy to strengthen muscles around the joint that hurts to take pressure off of the joint itself. Shoe inserts with good arch support may be helpful. Walker as you have been. Heat or ice 15 minutes at a time 3-4 times a day as needed to help with pain. Follow up with me in 1 month or as needed (or make appointment for 30 minutes to discuss both your back and shoulder sooner).

## 2015-01-26 ENCOUNTER — Other Ambulatory Visit: Payer: Self-pay | Admitting: Internal Medicine

## 2015-01-26 DIAGNOSIS — N2581 Secondary hyperparathyroidism of renal origin: Secondary | ICD-10-CM | POA: Diagnosis not present

## 2015-01-26 DIAGNOSIS — E1129 Type 2 diabetes mellitus with other diabetic kidney complication: Secondary | ICD-10-CM | POA: Diagnosis not present

## 2015-01-26 DIAGNOSIS — N186 End stage renal disease: Secondary | ICD-10-CM | POA: Diagnosis not present

## 2015-01-26 DIAGNOSIS — D631 Anemia in chronic kidney disease: Secondary | ICD-10-CM | POA: Diagnosis not present

## 2015-01-26 DIAGNOSIS — D509 Iron deficiency anemia, unspecified: Secondary | ICD-10-CM | POA: Diagnosis not present

## 2015-01-26 DIAGNOSIS — L03115 Cellulitis of right lower limb: Secondary | ICD-10-CM | POA: Diagnosis not present

## 2015-01-27 ENCOUNTER — Other Ambulatory Visit: Payer: Self-pay | Admitting: Internal Medicine

## 2015-01-27 DIAGNOSIS — M25562 Pain in left knee: Secondary | ICD-10-CM

## 2015-01-27 DIAGNOSIS — M25561 Pain in right knee: Secondary | ICD-10-CM | POA: Insufficient documentation

## 2015-01-27 NOTE — Progress Notes (Signed)
PCP: Cathlean Cower, MD  Subjective:   HPI: Patient is a 59 y.o. male here for bilateral knee pain.  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 Previously seen by Dr. Tamala Julian but was dismissed from [facility] for missing appointments. Last cortisone injections over 3 months ago. Has done viscosupplementation as well with some relief. Has had over 10 years of grinding R > L knee pain. Pain level 10/10 on right, 6/10 on left, sharp. Worse with standing, prolonged sitting and going to get up. No skin changes, fever, other complaints.  Past Medical History  Diagnosis Date  . Glaucoma   . Hypertension   . Hyperlipidemia   . Atrial fibrillation ([place]) 12/30/2012  . CHF (congestive heart failure) ([place]) 12/30/2012  . Diabetic retinopathy ([place]) 12/30/2012  . Morbid obesity ([place]) 12/30/2012  . Pulmonary hypertension ([place]) 12/30/2012  . Myocardial infarction ([place])     "I've had ~ 3; last one was in ~ 10/2013" (01/30/2014)  . Diabetes mellitus with neuropathy ([place])   . OSA (obstructive sleep apnea) 12/30/2012    "lost weight; no longer needed CPAP; retested said I needed it; didn't followup cause I was feeling fine" (01/30/2014)  . GERD (gastroesophageal reflux disease)   . Stroke [facility]) ~ 2005; ~ 2005    "they were mild; I didn't even notice I'd had them"; denies residual on 01/30/2014  . History of gout     "right big toe"  . Arthritis     "all over"  . Chronic lower back pain   . ESRD (end stage renal disease) on dialysis ([place]) started in 2013    MWF; [facility]; [employer]  . Refusal of blood transfusions as patient is Jehovah's Witness   . Anemia due to other cause 07/07/2014  . Allergic rhinitis 07/07/2014  . Hypothyroid     Current Outpatient Prescriptions on File Prior to Visit  Medication Sig Dispense Refill  . acetaminophen (TYLENOL) 650 MG CR tablet Take 650 mg by mouth every 8 (eight) hours as needed for pain.    Marland Kitchen allopurinol (ZYLOPRIM) 100 MG tablet Take 1 tablet (100  mg total) by mouth 2 (two) times daily. 60 tablet 1  . allopurinol (ZYLOPRIM) 100 MG tablet TAKE ONE TABLET BY MOUTH TWICE DAILY 180 tablet 1  . amiodarone (PACERONE) 200 MG tablet Take 1 tablet (200 mg total) by mouth daily. 90 tablet 1  . amitriptyline (ELAVIL) 50 MG tablet TAKE ONE TABLET BY MOUTH ONCE DAILY AT BEDTIME 90 tablet 0  . Blood Glucose Monitoring Suppl (ONE TOUCH ULTRA 2) W/DEVICE KIT Use to check blood sugars twice a day Dx E11.9 1 each 0  . carvedilol (COREG) 25 MG tablet Take 25 mg by mouth 2 (two) times daily with a meal. TAKE 1/2 TABLET THE NIGHT BEFORE AND MORNING OF DIALYSIS    . collagenase (SANTYL) ointment Apply topically daily. 15 g 1  . glucose blood (ONE TOUCH ULTRA TEST) test strip 1 each by Other route 2 (two) times daily. Use to check blood sugars twice a day Dx E11.9 100 each 3  . JANUVIA 25 MG tablet TAKE ONE TABLET BY MOUTH ONCE DAILY 90 tablet 0  . Lancets (ONETOUCH ULTRASOFT) lancets 1 each by Other route 2 (two) times daily. Use to help check blood sugars twice a day Dx E11.9 100 each 3  . loperamide (IMODIUM A-D) 2 MG tablet Take 2 mg by mouth 4 (four) times daily as needed for diarrhea or loose stools.    Marland Kitchen  ondansetron (ZOFRAN) 4 MG tablet Take 1 tablet (4 mg total) by mouth every 6 (six) hours. (Patient taking differently: Take 4 mg by mouth every 6 (six) hours as needed for nausea or vomiting. ) 30 tablet 0  . ranitidine (ZANTAC) 150 MG tablet Take 150 mg by mouth daily as needed for heartburn.    . sevelamer carbonate (RENVELA) 800 MG tablet Take 1,600 mg by mouth 3 (three) times daily with meals.     . simvastatin (ZOCOR) 20 MG tablet TAKE ONE TABLET BY MOUTH ONCE DAILY 90 tablet 0  . sodium chloride (OCEAN) 0.65 % SOLN nasal spray Place 1 spray into both nostrils as needed for congestion. 104 mL 0  . tadalafil (CIALIS) 20 MG tablet Take 1 tablet (20 mg total) by mouth daily as needed for erectile dysfunction. 5 tablet 11  . warfarin (COUMADIN) 3 MG  tablet Please take as directed by anticoagulation clinic. (Patient taking differently: Take 3-4.5 mg by mouth daily at 6 PM. Takes 4.30m on thu only Takes 386mall other days) 90 tablet 0   No current facility-administered medications on file prior to visit.    Past Surgical History  Procedure Laterality Date  . Total hip arthroplasty Right 1980  . Av fistula placement Left ~ 10/2013    "forearm"  . Closed reduction hip dislocation Right 1970's  . Cardiac catheterization    . Cardiac catheterization N/A 01/07/2015    Procedure: Left Heart Cath and Coronary Angiography;  Surgeon: ThTroy SineMD;  [facility];  Service: Cardiovascular;  Laterality: N/A;    No Known Allergies  Social History   Social History  . Marital Status: Divorced    Spouse Name: N/A  . Number of Children: 2  . Years of Education: N/A   Occupational History  . Not on file.   Social History Main Topics  . Smoking status: Never Smoker   . Smokeless tobacco: Never Used  . Alcohol Use: 1.8 - 2.4 oz/week    3-4 Standard drinks or equivalent per week     Comment: 01/30/2014 "none in the last 3 weeks; primarily drink w/my friends on the weekends"  . Drug Use: No  . Sexual Activity: Not Currently   Other Topics Concern  . Not on file   Social History Narrative    Family History  Problem Relation Age of Onset  . Arthritis Other   . Heart disease Other   . Hyperlipidemia Other   . Stroke Other   . Hypertension Other   . Kidney disease Other   . Diabetes Other     BP 107/63 mmHg  Pulse 86  Ht '5\' 9"'  (1.753 m)  Wt 245 lb (111.131 kg)  BMI 36.16 kg/m2  Review of Systems: See HPI above.    Objective:  Physical Exam:  Gen: NAD, comfortable  Right knee: Mod effusion.  No bruising, other deformity. Mild medial > lateral joint line tenderness. ROM 0 - 100 degrees. Negative ant/post drawers. Negative valgus/varus testing. Negative lachmanns. Negative mcmurrays, apleys,  patellar apprehension. NV intact distally.  Left knee: Mod effusion.  No bruising, other deformity. Mild medial > lateral joint line tenderness. ROM 0 - 100 degrees. Negative ant/post drawers. Negative valgus/varus testing. Negative lachmanns. Negative mcmurrays, apleys, patellar apprehension. NV intact distally.    Assessment & Plan:  1. Bilateral knee pain - 2/2 severe DJD confirmed with left knee radiographs showing tricompartmental severe DJD in June.  He is a poor surgical candidate.  Repeated cortisone injections today following aspiration.  Discussed tylenol, glucosamine, topical medications, home exercises.  Continue walker.  Ice/heat as needed.  F/u in 1 month or prn.  After informed written consent patient was lying supine on exam table.  Right knee was prepped with alcohol swab.  Utilizing superolateral approach, 3 mL of marcaine was used for local anesthesia.  Then using an 18g needle on 60cc syringe, 55 mL of clear straw-colored fluid was aspirated from right knee.  Knee was then injected with 3:1 marcaine:depomedrol.  Patient tolerated procedure well without immediate complications  After informed written consent patient was lying supine on exam table.  Left knee was prepped with alcohol swab.  Utilizing superolateral approach, 3 mL of marcaine was used for local anesthesia.  Then using an 18g needle on 60cc syringe,58 mL of clear straw-colored fluid was aspirated from left knee.  Knee was then injected with 3:1 marcaine:depomedrol.  Patient tolerated procedure well without immediate complications

## 2015-01-27 NOTE — Assessment & Plan Note (Signed)
2/2 severe DJD confirmed with left knee radiographs showing tricompartmental severe DJD in June.  He is a poor surgical candidate.  Repeated cortisone injections today following aspiration.  Discussed tylenol, glucosamine, topical medications, home exercises.  Continue walker.  Ice/heat as needed.  F/u in 1 month or prn.  After informed written consent patient was lying supine on exam table.  Right knee was prepped with alcohol swab.  Utilizing superolateral approach, 3 mL of marcaine was used for local anesthesia.  Then using an 18g needle on 60cc syringe, 55 mL of clear straw-colored fluid was aspirated from right knee.  Knee was then injected with 3:1 marcaine:depomedrol.  Patient tolerated procedure well without immediate complications  After informed written consent patient was lying supine on exam table.  Left knee was prepped with alcohol swab.  Utilizing superolateral approach, 3 mL of marcaine was used for local anesthesia.  Then using an 18g needle on 60cc syringe,58 mL of clear straw-colored fluid was aspirated from left knee.  Knee was then injected with 3:1 marcaine:depomedrol.  Patient tolerated procedure well without immediate complications

## 2015-01-28 ENCOUNTER — Other Ambulatory Visit: Payer: Self-pay | Admitting: Internal Medicine

## 2015-01-28 DIAGNOSIS — D631 Anemia in chronic kidney disease: Secondary | ICD-10-CM | POA: Diagnosis not present

## 2015-01-28 DIAGNOSIS — L03115 Cellulitis of right lower limb: Secondary | ICD-10-CM | POA: Diagnosis not present

## 2015-01-28 DIAGNOSIS — D509 Iron deficiency anemia, unspecified: Secondary | ICD-10-CM | POA: Diagnosis not present

## 2015-01-28 DIAGNOSIS — N2581 Secondary hyperparathyroidism of renal origin: Secondary | ICD-10-CM | POA: Diagnosis not present

## 2015-01-28 DIAGNOSIS — E1129 Type 2 diabetes mellitus with other diabetic kidney complication: Secondary | ICD-10-CM | POA: Diagnosis not present

## 2015-01-28 DIAGNOSIS — N186 End stage renal disease: Secondary | ICD-10-CM | POA: Diagnosis not present

## 2015-01-31 DIAGNOSIS — L03115 Cellulitis of right lower limb: Secondary | ICD-10-CM | POA: Diagnosis not present

## 2015-01-31 DIAGNOSIS — N2581 Secondary hyperparathyroidism of renal origin: Secondary | ICD-10-CM | POA: Diagnosis not present

## 2015-01-31 DIAGNOSIS — D509 Iron deficiency anemia, unspecified: Secondary | ICD-10-CM | POA: Diagnosis not present

## 2015-01-31 DIAGNOSIS — D631 Anemia in chronic kidney disease: Secondary | ICD-10-CM | POA: Diagnosis not present

## 2015-01-31 DIAGNOSIS — E1129 Type 2 diabetes mellitus with other diabetic kidney complication: Secondary | ICD-10-CM | POA: Diagnosis not present

## 2015-01-31 DIAGNOSIS — N186 End stage renal disease: Secondary | ICD-10-CM | POA: Diagnosis not present

## 2015-02-01 ENCOUNTER — Ambulatory Visit: Payer: Medicare Other | Admitting: Family Medicine

## 2015-02-02 ENCOUNTER — Encounter: Payer: Self-pay | Admitting: Family Medicine

## 2015-02-02 ENCOUNTER — Ambulatory Visit (INDEPENDENT_AMBULATORY_CARE_PROVIDER_SITE_OTHER): Payer: Medicare Other | Admitting: Family Medicine

## 2015-02-02 ENCOUNTER — Ambulatory Visit (HOSPITAL_BASED_OUTPATIENT_CLINIC_OR_DEPARTMENT_OTHER)
Admission: RE | Admit: 2015-02-02 | Discharge: 2015-02-02 | Disposition: A | Discharge status: Home / Self Care | Payer: Medicare Other | Source: Ambulatory Visit | Attending: Family Medicine | Admitting: Family Medicine

## 2015-02-02 VITALS — BP 113/73 | HR 111 | Ht 69.0 in | Wt 250.0 lb

## 2015-02-02 DIAGNOSIS — M545 Low back pain, unspecified: Secondary | ICD-10-CM

## 2015-02-02 DIAGNOSIS — M75101 Unspecified rotator cuff tear or rupture of right shoulder, not specified as traumatic: Secondary | ICD-10-CM | POA: Diagnosis not present

## 2015-02-02 DIAGNOSIS — L03115 Cellulitis of right lower limb: Secondary | ICD-10-CM | POA: Diagnosis not present

## 2015-02-02 DIAGNOSIS — M25511 Pain in right shoulder: Secondary | ICD-10-CM | POA: Diagnosis not present

## 2015-02-02 DIAGNOSIS — D631 Anemia in chronic kidney disease: Secondary | ICD-10-CM | POA: Diagnosis not present

## 2015-02-02 DIAGNOSIS — N2581 Secondary hyperparathyroidism of renal origin: Secondary | ICD-10-CM | POA: Diagnosis not present

## 2015-02-02 DIAGNOSIS — N186 End stage renal disease: Secondary | ICD-10-CM | POA: Diagnosis not present

## 2015-02-02 DIAGNOSIS — M4856XD Collapsed vertebra, not elsewhere classified, lumbar region, subsequent encounter for fracture with routine healing: Secondary | ICD-10-CM | POA: Diagnosis not present

## 2015-02-02 DIAGNOSIS — S32030A Wedge compression fracture of third lumbar vertebra, initial encounter for closed fracture: Secondary | ICD-10-CM | POA: Diagnosis not present

## 2015-02-02 DIAGNOSIS — I639 Cerebral infarction, unspecified: Secondary | ICD-10-CM

## 2015-02-02 DIAGNOSIS — D509 Iron deficiency anemia, unspecified: Secondary | ICD-10-CM | POA: Diagnosis not present

## 2015-02-02 DIAGNOSIS — E1129 Type 2 diabetes mellitus with other diabetic kidney complication: Secondary | ICD-10-CM | POA: Diagnosis not present

## 2015-02-02 MED ORDER — AMITRIPTYLINE HCL 75 MG PO TABS: 75.0000 mg | ORAL_TABLET | Freq: Every day | ORAL | Status: DC

## 2015-02-02 MED ORDER — METHYLPREDNISOLONE ACETATE 40 MG/ML IJ SUSP
40.0000 mg | Freq: Once | INTRAMUSCULAR | Status: AC
  Administered 2015-02-02: 40 mg via INTRA_ARTICULAR

## 2015-02-02 NOTE — Addendum Note (Signed)
Addended by: Sherrie George F on: 02/02/2015 03:29 PM   Modules accepted: Orders

## 2015-02-02 NOTE — Patient Instructions (Addendum)
You have rotator cuff tears (supraspinatus and infraspinatus) Try to avoid painful activities (overhead activities, lifting with extended arm) as much as possible. Tylenol 500mg  1-2 tabs three times a day as needed for pain. Subacromial injection may be beneficial to help with pain and to decrease inflammation. Consider physical therapy with transition to home exercise program. Do home exercise program with theraband and scapular stabilization exercises daily - these are very important for long term relief even if an injection was given.  3 sets of 10 once a day if tolerated. Call me if not improving over the next couple weeks and i'd consider sending in nitro patches.  Your compression fracture of your back stabilized. I would start physical therapy at this point to strengthen your back and core which will decrease your pain. MRI is an option if you aren't improving. Increase amitriptyline to 75mg . Speak about pain issues with your new family doctor as well from your neuropathy. Consider back brace, heat for secondary spasms. Follow up with me in 6 weeks for these issues.

## 2015-02-03 ENCOUNTER — Encounter (HOSPITAL_BASED_OUTPATIENT_CLINIC_OR_DEPARTMENT_OTHER): Payer: Medicare Other

## 2015-02-03 ENCOUNTER — Ambulatory Visit: Payer: Medicare Other | Attending: Family Medicine | Admitting: Physical Therapy

## 2015-02-03 ENCOUNTER — Encounter: Payer: Self-pay | Admitting: Physical Therapy

## 2015-02-03 DIAGNOSIS — M25562 Pain in left knee: Secondary | ICD-10-CM | POA: Insufficient documentation

## 2015-02-03 DIAGNOSIS — G8929 Other chronic pain: Secondary | ICD-10-CM | POA: Diagnosis not present

## 2015-02-03 DIAGNOSIS — R262 Difficulty in walking, not elsewhere classified: Secondary | ICD-10-CM | POA: Diagnosis not present

## 2015-02-03 DIAGNOSIS — R269 Unspecified abnormalities of gait and mobility: Secondary | ICD-10-CM | POA: Insufficient documentation

## 2015-02-03 DIAGNOSIS — M25561 Pain in right knee: Secondary | ICD-10-CM | POA: Diagnosis not present

## 2015-02-03 DIAGNOSIS — M545 Low back pain, unspecified: Secondary | ICD-10-CM

## 2015-02-03 DIAGNOSIS — R29898 Other symptoms and signs involving the musculoskeletal system: Secondary | ICD-10-CM

## 2015-02-03 NOTE — Therapy (Signed)
[facility] [address]  [place], [place], [address] Phone: [phone]   Fax:  [phone]  Physical Therapy Evaluation  Patient Details  Name: Quintavius Cary MRN: [id number] Date of Birth: [birth year] Referring Provider: Karlton Lemon  Encounter Date: 02/03/2015      PT End of Session - 02/03/15 1413    Visit Number 1   Number of Visits 12   Date for PT Re-Evaluation 03/31/15  pt will be out of town during [place] so date has been pushed out to 8 wks   PT Start Time 1400   PT Stop Time 1450   PT Time Calculation (min) 50 min      Past Medical History  Diagnosis Date  . Glaucoma   . Hypertension   . Hyperlipidemia   . Atrial fibrillation ([place]) 12/30/2012  . CHF (congestive heart failure) ([place]) 12/30/2012  . Diabetic retinopathy ([place]) 12/30/2012  . Morbid obesity ([place]) 12/30/2012  . Pulmonary hypertension ([place]) 12/30/2012  . Myocardial infarction ([place])     "I've had ~ 3; last one was in ~ 10/2013" (01/30/2014)  . Diabetes mellitus with neuropathy ([place])   . OSA (obstructive sleep apnea) 12/30/2012    "lost weight; no longer needed CPAP; retested said I needed it; didn't followup cause I was feeling fine" (01/30/2014)  . GERD (gastroesophageal reflux disease)   . Stroke [facility]) ~ 2005; ~ 2005    "they were mild; I didn't even notice I'd had them"; denies residual on 01/30/2014  . History of gout     "right big toe"  . Arthritis     "all over"  . Chronic lower back pain   . ESRD (end stage renal disease) on dialysis ([place]) started in 2013    MWF; [facility]; [employer]  . Refusal of blood transfusions as patient is Jehovah's Witness   . Anemia due to other cause 07/07/2014  . Allergic rhinitis 07/07/2014  . Hypothyroid     Past Surgical History  Procedure Laterality Date  . Total hip arthroplasty Right 1980  . Av fistula placement Left ~ 10/2013    "forearm"  . Closed reduction hip dislocation Right  1970's  . Cardiac catheterization    . Cardiac catheterization N/A 01/07/2015    Procedure: Left Heart Cath and Coronary Angiography;  Surgeon: Troy Sine, MD;  Location: [facility];  Service: Cardiovascular;  Laterality: N/A;    There were no vitals filed for this visit.  Visit Diagnosis:  Arthralgia of both knees  Chronic lower back pain  Difficulty walking  Abnormality of gait  Weakness of right hip      Subjective Assessment - 02/03/15 1404    Subjective pt sent to OPPT due to B Knee pain and LBP.   B knee pain is chronic severe OA.  LBP related to falling last year onto buttock.   Pertinent History pt hospitalized 2015 twice due to MI and participated in [place] following   Diagnostic tests x-rays reveal healing L3 compression fx along with lower l-spine DDD and DJD   Patient Stated Goals build up muscle to help relieve pain   Currently in Pain? Yes   Pain Score --  AVG 6/10, 10/10 at worst   Pain Location Knee   Pain Orientation Right   Pain Onset More than a month ago   Pain Frequency Intermittent   Aggravating Factors  walking, wt bearing   Pain Relieving Factors lying down  Multiple Pain Sites Yes   Pain Score --  5/10 AVG, 10/10 worst   Pain Location Knee   Pain Orientation Left   Pain Onset More than a month ago   Pain Frequency Intermittent   Aggravating Factors  walking, wt bearing   Pain Relieving Factors lying down   Pain Score --  AVG 4-5/10 at rest, 10/10 at worst   Pain Location Back   Pain Orientation Lower   Pain Onset More than a month ago   Pain Frequency Constant   Aggravating Factors  sit->stand, walking            [facility] PT Assessment - 02/03/15 0001    Assessment   Medical Diagnosis B Knee OA, LBP   Referring Provider Karlton Lemon   Onset Date/Surgical Date 02/12/09   Next MD Visit 03/01/15   Balance Screen   Has the patient fallen in the past 6 months No   Has the patient had a decrease in activity level because of a  fear of falling?  Yes   Is the patient reluctant to leave their home because of a fear of falling?  Yes   Home Environment   Living Environment Private residence   Type of [place] Access Stairs to enter   Entrance Stairs-Number of Steps 8  step-to gait with SPC and railing   Home Layout One level   Prior Function   Vocation On disability   Observation/Other Assessments   Focus on Therapeutic Outcomes (FOTO)  79% limitation   ROM / Strength   AROM / PROM / Strength Strength   Strength   Strength Assessment Site Hip   Right/Left Hip Right;Left   Right Hip Flexion 4-/5   Right Hip Extension 4/5   Right Hip External Rotation  3+/5   Right Hip Internal Rotation 4-/5   Right Hip ABduction 4-/5   Left Hip Flexion 4/5   Left Hip Extension 4+/5   Left Hip External Rotation 4/5   Left Hip Internal Rotation 4+/5   Left Hip ABduction 4/5   Right/Left Knee Right;Left   Right Knee Flexion 4/5   Right Knee Extension 4-/5   Left Knee Flexion 4+/5   Left Knee Extension 4+/5        TODAY'S TREATMENT TherEx - Bridge 6x Seated Hip ABD Black TB 10x Verbally instructed in remainder of HEP: seated Hip flexion, seated LAQ, seated Hip ADD into pillow or ball.                   PT Education - 02/03/15 1640    Education provided Yes   Education Details initial HEP   Person(s) Educated Patient   Methods Explanation;Demonstration;Handout   Comprehension Verbalized understanding;Returned demonstration          PT Short Term Goals - 02/03/15 1701    PT SHORT TERM GOAL #1   Title pt independent with initial HEP by 02/12/15   Status New              PT Long Term Goals - 02/03/15 1500    PT LONG TERM GOAL #1   Title pt independent with advanced HEP as necessary by 03/31/15   Status New   PT LONG TERM GOAL #2   Title pt able to ambulate within community with use of Rollator without c/o knee pain greater than 4/10 at worst by 03/31/15   Status New   PT LONG  TERM GOAL #3   Title  pt displays B hip and knee MMT 4+/5 or better grossly by 03/31/15   Status New   PT LONG TERM GOAL #4   Title pt reports LBP improves to no greater than 2/10 on AVG and 4/10 at worst by 03/31/15   Status New              Plan - Mar 05, 2015 1404    Clinical Impression Statement Pt sent to OPPT due to B knee pain and LBP.  Pt with severe B knee OA which pt states has been present past couple years with R worse than L.  B knees display valgus deformity during wt bearing and significant play with varus/valgus strains.  He wears brace to R knee with some benefit. He ambulates with use of Rollator while away from home and uses SPC while at home due to B knee pain.  He rates R knee pain 6/10 on AVG and 10/10 at worst and L knee pain 5/10 on AVG and 10/10 at worst.  MMT reveals significant weakness throughout R Hip with most significant weakness noted into ABD and ER (4-/5 and 3+/5 respectively).  B Knee AROM is WFL other than R knee lacking 8 degrees of TKE.  Lumbar AROM not assessed today but pt displays signficant tightness to B HS.   Pt will benefit from skilled therapeutic intervention in order to improve on the following deficits Pain;Decreased strength;Decreased mobility;Postural dysfunction;Improper body mechanics;Impaired flexibility;Decreased balance;Decreased activity tolerance;Abnormal gait;Difficulty walking;Decreased range of motion;Decreased endurance   Rehab Potential Fair   Clinical Impairments Affecting Rehab Potential severe OA, chronic nature of issues; extensive medical history including CHF and renal failure   PT Frequency 2x / week   PT Duration 8 weeks  POC is for 12 visits in an 8 week period because pt is going to be out of town at start of [place]   PT Treatment/Interventions Therapeutic exercise;Therapeutic activities;Manual techniques;Functional mobility training;Gait training;Stair training;Neuromuscular re-education;Patient/family education;Moist  Heat;Cryotherapy;Electrical Stimulation;Taping   PT Next Visit Plan lumbopelvic stability training; HS stretch; B Hip strengthening to tolerance   Consulted and Agree with Plan of Care Patient          G-Codes - 03-05-2015 1413    Functional Assessment Tool Used foto 79% limitation   Functional Limitation Mobility: Walking and moving around   Mobility: Walking and Moving Around Current Status [phone]) At least 60 percent but less than 80 percent impaired, limited or restricted   Mobility: Walking and Moving Around Goal Status [phone]) At least 40 percent but less than 60 percent impaired, limited or restricted       Problem List Patient Active Problem List   Diagnosis Date Noted  . Bilateral knee pain 01/27/2015  . Atypical atrial flutter ([place]) 01/24/2015  . Chest pain   . Acute on chronic combined systolic and diastolic heart failure ([place]) 01/05/2015  . Hyperkalemia 11/08/2014  . End stage renal disease on dialysis ([place]) 11/08/2014  . Hypocalcemia 11/08/2014  . Weakness 11/08/2014  . Recurrent falls 10/13/2014  . Venous stasis ulcer ([place]) 10/13/2014  . Hypoxia   . Leukocytosis   . Respiratory failure ([place])   . Cardiac arrest ([place])   . ARDS (adult respiratory distress syndrome) ([place])   . Encounter for central line placement   . Asystole ([place]) 08/13/2014  . Cardiopulmonary arrest ([place])   . Compression fracture of L3 lumbar vertebra ([place]) 08/04/2014  . Anemia due to other cause 07/07/2014  . Allergic rhinitis 07/07/2014  . Right rotator cuff tear 04/27/2014  .  SOB (shortness of breath) 01/29/2014  . Acute gouty arthritis 11/11/2013  . Hearing loss in right ear 11/11/2013  . Chronic pain syndrome 11/11/2013  . Right shoulder pain 11/11/2013  . Facial rash 11/01/2013  . Right otitis externa 10/27/2013  . Pulmonary HTN- PA 58 mmHg 04/16/13 09/17/2013  . Right hip pain 08/04/2013  . DOE (dyspnea on exertion) 06/18/2013  . Allergic rhinitis, cause unspecified 06/18/2013  .  Carpal tunnel syndrome 06/06/2013  . Leg pain, bilateral 06/06/2013  . Primary localized osteoarthrosis, lower leg 06/04/2013  . Encounter for therapeutic drug monitoring 03/10/2013  . Preventative health care 12/30/2012  . Atrial fibrillation- failed Amiodarone 12/30/2012  . CHF (congestive heart failure) ([place]) 12/30/2012  . Diabetic retinopathy ([place]) 12/30/2012  . Morbid obesity ([place]) 12/30/2012  . OSA (obstructive sleep apnea)- on C-pap 12/30/2012  . Pulmonary hypertension ([place]) 12/30/2012  . Bilateral hearing loss 12/30/2012  . Long term (current) use of anticoagulants 12/30/2012  . Arthritis   . Glaucoma   . Diabetes mellitus with neuropathy ([place])   . Hypertension   . ESRD (end stage renal disease) on dialysis ([place])   . Stroke ([place])   . Hyperlipidemia     Charizma Gardiner PT, OCS 02/03/2015, 5:02 PM  [facility] [address]  [place], [place], [address] Phone: [phone]   Fax:  [phone]  Name: Zelma Fiene MRN: [id number] Date of Birth: 01/21/1956

## 2015-02-04 DIAGNOSIS — D631 Anemia in chronic kidney disease: Secondary | ICD-10-CM | POA: Diagnosis not present

## 2015-02-04 DIAGNOSIS — D509 Iron deficiency anemia, unspecified: Secondary | ICD-10-CM | POA: Diagnosis not present

## 2015-02-04 DIAGNOSIS — N186 End stage renal disease: Secondary | ICD-10-CM | POA: Diagnosis not present

## 2015-02-04 DIAGNOSIS — L03115 Cellulitis of right lower limb: Secondary | ICD-10-CM | POA: Diagnosis not present

## 2015-02-04 DIAGNOSIS — E1129 Type 2 diabetes mellitus with other diabetic kidney complication: Secondary | ICD-10-CM | POA: Diagnosis not present

## 2015-02-04 DIAGNOSIS — N2581 Secondary hyperparathyroidism of renal origin: Secondary | ICD-10-CM | POA: Diagnosis not present

## 2015-02-07 DIAGNOSIS — N2581 Secondary hyperparathyroidism of renal origin: Secondary | ICD-10-CM | POA: Diagnosis not present

## 2015-02-07 DIAGNOSIS — E1129 Type 2 diabetes mellitus with other diabetic kidney complication: Secondary | ICD-10-CM | POA: Diagnosis not present

## 2015-02-07 DIAGNOSIS — I4891 Unspecified atrial fibrillation: Secondary | ICD-10-CM | POA: Diagnosis not present

## 2015-02-07 DIAGNOSIS — D631 Anemia in chronic kidney disease: Secondary | ICD-10-CM | POA: Diagnosis not present

## 2015-02-07 DIAGNOSIS — N186 End stage renal disease: Secondary | ICD-10-CM | POA: Diagnosis not present

## 2015-02-07 DIAGNOSIS — L03115 Cellulitis of right lower limb: Secondary | ICD-10-CM | POA: Diagnosis not present

## 2015-02-07 DIAGNOSIS — D509 Iron deficiency anemia, unspecified: Secondary | ICD-10-CM | POA: Diagnosis not present

## 2015-02-07 NOTE — ED Provider Notes (Signed)
Late Entry for 01/03/2015 encounter:   Right intraosseus line placed in ED for access at beginning of resuscitation by paramedic team.   Merrily Pew, MD 02/07/15 1556

## 2015-02-08 ENCOUNTER — Ambulatory Visit: Payer: Medicare Other | Admitting: Physical Therapy

## 2015-02-08 DIAGNOSIS — M545 Low back pain: Secondary | ICD-10-CM | POA: Diagnosis not present

## 2015-02-08 DIAGNOSIS — G8929 Other chronic pain: Secondary | ICD-10-CM

## 2015-02-08 DIAGNOSIS — M25561 Pain in right knee: Secondary | ICD-10-CM | POA: Diagnosis not present

## 2015-02-08 DIAGNOSIS — R262 Difficulty in walking, not elsewhere classified: Secondary | ICD-10-CM

## 2015-02-08 DIAGNOSIS — M25562 Pain in left knee: Principal | ICD-10-CM

## 2015-02-08 DIAGNOSIS — R269 Unspecified abnormalities of gait and mobility: Secondary | ICD-10-CM | POA: Diagnosis not present

## 2015-02-08 DIAGNOSIS — R29898 Other symptoms and signs involving the musculoskeletal system: Secondary | ICD-10-CM

## 2015-02-08 NOTE — Therapy (Signed)
[facility] [address]  [place], [place], [address] Phone: [phone]   Fax:  [phone]  Physical Therapy Treatment  Patient Details  Name: Daniel Whitney MRN: [id number] Date of Birth: 05/26/1955 Referring Provider: Karlton Lemon  Encounter Date: 02/08/2015      PT End of Session - 02/08/15 1629    Visit Number 2   Number of Visits 12   Date for PT Re-Evaluation 03/31/15   PT Start Time 1623   PT Stop Time [id number]   PT Time Calculation (min) 36 min      Past Medical History  Diagnosis Date  . Glaucoma   . Hypertension   . Hyperlipidemia   . Atrial fibrillation ([place]) 12/30/2012  . CHF (congestive heart failure) ([place]) 12/30/2012  . Diabetic retinopathy ([place]) 12/30/2012  . Morbid obesity ([place]) 12/30/2012  . Pulmonary hypertension ([place]) 12/30/2012  . Myocardial infarction ([place])     "I've had ~ 3; last one was in ~ 10/2013" (01/30/2014)  . Diabetes mellitus with neuropathy ([place])   . OSA (obstructive sleep apnea) 12/30/2012    "lost weight; no longer needed CPAP; retested said I needed it; didn't followup cause I was feeling fine" (01/30/2014)  . GERD (gastroesophageal reflux disease)   . Stroke [facility]) ~ 2005; ~ 2005    "they were mild; I didn't even notice I'd had them"; denies residual on 01/30/2014  . History of gout     "right big toe"  . Arthritis     "all over"  . Chronic lower back pain   . ESRD (end stage renal disease) on dialysis ([place]) started in 2013    MWF; [facility]; [employer]  . Refusal of blood transfusions as patient is Jehovah's Witness   . Anemia due to other cause 07/07/2014  . Allergic rhinitis 07/07/2014  . Hypothyroid     Past Surgical History  Procedure Laterality Date  . Total hip arthroplasty Right 1980  . Av fistula placement Left ~ 10/2013    "forearm"  . Closed reduction hip dislocation Right 1970's  . Cardiac catheterization    . Cardiac catheterization N/A  01/07/2015    Procedure: Left Heart Cath and Coronary Angiography;  Surgeon: Troy Sine, MD;  Location: [facility];  Service: Cardiovascular;  Laterality: N/A;    There were no vitals filed for this visit.  Visit Diagnosis:  Arthralgia of both knees  Chronic lower back pain  Difficulty walking  Abnormality of gait  Weakness of right hip      Subjective Assessment - 02/08/15 1625    Subjective has been performing HEP; states L knee feels fine today and 6/10 pain in lower back and R knee today.  State noted shooting pain from bottom of R foot to R knee earlier today while performing car transfer out of car.   Currently in Pain? Yes   Pain Score 6    Pain Location Knee   Pain Orientation Right   Pain Score 0   Pain Location Knee   Pain Orientation Left   Pain Score 6   Pain Location Back   Pain Orientation Lower       TODAY'S TREATMENT TherEx - Bridge 2x12 B FOB (55cm) Tuck AROM 12x B Hip ABD Blue TB 12x Unilateral Hip ADD Blue TB 12x each B SAQ 10x (mild pain so no wt added today) Seated Fitter Unilateral Leg Press 1 Blue +  Black 2x12 each Seated Knee Flexion  Double Black TB 2x12 each Machine Low Row 35# 15x, 45# 15x Machine unilateral Low Row 20# 15x each Standing Hip ABD Yellow TB at ankles 15x each            PT Short Term Goals - 02/08/15 1659    PT SHORT TERM GOAL #1   Title pt independent with initial HEP by 02/12/15   Status Achieved           PT Long Term Goals - 02/08/15 1659    PT LONG TERM GOAL #1   Title pt independent with advanced HEP as necessary by 03/31/15   Status On-going   PT LONG TERM GOAL #2   Title pt able to ambulate within community with use of Rollator without c/o knee pain greater than 4/10 at worst by 03/31/15   Status On-going   PT LONG TERM GOAL #3   Title pt displays B hip and knee MMT 4+/5 or better grossly by 03/31/15   Status On-going   PT LONG TERM GOAL #4   Title pt reports LBP improves to no  greater than 2/10 on AVG and 4/10 at worst by 03/31/15   Status On-going               Plan - 02/08/15 1658    Clinical Impression Statement pt performed very well with initial treatment today, is very motivated.  Pt will be going out of town and will not return to PT until mid January.   PT Next Visit Plan lumbopelvic stability training; HS stretch; B Hip strengthening to tolerance   Consulted and Agree with Plan of Care Patient        Problem List Patient Active Problem List   Diagnosis Date Noted  . Bilateral knee pain 01/27/2015  . Atypical atrial flutter ([place]) 01/24/2015  . Chest pain   . Acute on chronic combined systolic and diastolic heart failure ([place]) 01/05/2015  . Hyperkalemia 11/08/2014  . End stage renal disease on dialysis ([place]) 11/08/2014  . Hypocalcemia 11/08/2014  . Weakness 11/08/2014  . Recurrent falls 10/13/2014  . Venous stasis ulcer ([place]) 10/13/2014  . Hypoxia   . Leukocytosis   . Respiratory failure ([place])   . Cardiac arrest ([place])   . ARDS (adult respiratory distress syndrome) ([place])   . Encounter for central line placement   . Asystole ([place]) 08/13/2014  . Cardiopulmonary arrest ([place])   . Compression fracture of L3 lumbar vertebra ([place]) 08/04/2014  . Anemia due to other cause 07/07/2014  . Allergic rhinitis 07/07/2014  . Right rotator cuff tear 04/27/2014  . SOB (shortness of breath) 01/29/2014  . Acute gouty arthritis 11/11/2013  . Hearing loss in right ear 11/11/2013  . Chronic pain syndrome 11/11/2013  . Right shoulder pain 11/11/2013  . Facial rash 11/01/2013  . Right otitis externa 10/27/2013  . Pulmonary HTN- PA 58 mmHg 04/16/13 09/17/2013  . Right hip pain 08/04/2013  . DOE (dyspnea on exertion) 06/18/2013  . Allergic rhinitis, cause unspecified 06/18/2013  . Carpal tunnel syndrome 06/06/2013  . Leg pain, bilateral 06/06/2013  . Primary localized osteoarthrosis, lower leg 06/04/2013  . Encounter for therapeutic drug monitoring  03/10/2013  . Preventative health care 12/30/2012  . Atrial fibrillation- failed Amiodarone 12/30/2012  . CHF (congestive heart failure) ([place]) 12/30/2012  . Diabetic retinopathy ([place]) 12/30/2012  . Morbid obesity ([place]) 12/30/2012  . OSA (obstructive sleep apnea)- on C-pap 12/30/2012  . Pulmonary hypertension ([place]) 12/30/2012  . Bilateral hearing loss 12/30/2012  .  Long term (current) use of anticoagulants 12/30/2012  . Arthritis   . Glaucoma   . Diabetes mellitus with neuropathy ([place])   . Hypertension   . ESRD (end stage renal disease) on dialysis ([place])   . Stroke ([place])   . Hyperlipidemia     Nikkita Adeyemi PT, OCS 02/08/2015, 5:01 PM  [facility] [address]  [place], [place], [address] Phone: [phone]   Fax:  [phone]  Name: Daniel Whitney MRN: [id number] Date of Birth: 1955/08/29

## 2015-02-09 DIAGNOSIS — M545 Low back pain, unspecified: Secondary | ICD-10-CM | POA: Insufficient documentation

## 2015-02-09 DIAGNOSIS — L03115 Cellulitis of right lower limb: Secondary | ICD-10-CM | POA: Diagnosis not present

## 2015-02-09 DIAGNOSIS — N186 End stage renal disease: Secondary | ICD-10-CM | POA: Diagnosis not present

## 2015-02-09 DIAGNOSIS — N2581 Secondary hyperparathyroidism of renal origin: Secondary | ICD-10-CM | POA: Diagnosis not present

## 2015-02-09 DIAGNOSIS — D509 Iron deficiency anemia, unspecified: Secondary | ICD-10-CM | POA: Diagnosis not present

## 2015-02-09 DIAGNOSIS — E1129 Type 2 diabetes mellitus with other diabetic kidney complication: Secondary | ICD-10-CM | POA: Diagnosis not present

## 2015-02-09 DIAGNOSIS — D631 Anemia in chronic kidney disease: Secondary | ICD-10-CM | POA: Diagnosis not present

## 2015-02-09 NOTE — Progress Notes (Signed)
PCP: Cathlean Cower, MD  Subjective:   HPI: Patient is a 59 y.o. male here for right shoulder, low back pain.  Patient has had problems with right shoulder dating back at least to early March. He sustained a fall at that time with severe pain in right shoulder and arm. Radiographs had a small joint effusion, chronic Hill-Sachs deformity. After this fall he could no longer lift right arm and has continued to have this problem. Pain level 7/10, sharp lateral shoulder. MSK u/s by Dr. Tamala Julian showed a full thickness supraspinatus tear that was retracted, subscapularis tear. He has also had low back pain central mid-low back at 7/10 level, sharp as well. Had compression fracture diagnosed by ED in June. Some radiation into right hip area. Used calcitonin initially. No bowel/bladder dysfunction. No numbness/tingling.  Past Medical History  Diagnosis Date  . Glaucoma   . Hypertension   . Hyperlipidemia   . Atrial fibrillation ([place]) 12/30/2012  . CHF (congestive heart failure) ([place]) 12/30/2012  . Diabetic retinopathy ([place]) 12/30/2012  . Morbid obesity ([place]) 12/30/2012  . Pulmonary hypertension ([place]) 12/30/2012  . Myocardial infarction ([place])     "I've had ~ 3; last one was in ~ 10/2013" (01/30/2014)  . Diabetes mellitus with neuropathy ([place])   . OSA (obstructive sleep apnea) 12/30/2012    "lost weight; no longer needed CPAP; retested said I needed it; didn't followup cause I was feeling fine" (01/30/2014)  . GERD (gastroesophageal reflux disease)   . Stroke [facility]) ~ 2005; ~ 2005    "they were mild; I didn't even notice I'd had them"; denies residual on 01/30/2014  . History of gout     "right big toe"  . Arthritis     "all over"  . Chronic lower back pain   . ESRD (end stage renal disease) on dialysis ([place]) started in 2013    MWF; [facility]; [employer]  . Refusal of blood transfusions as patient is Jehovah's Witness   . Anemia due to other cause 07/07/2014  . Allergic rhinitis  07/07/2014  . Hypothyroid     Current Outpatient Prescriptions on File Prior to Visit  Medication Sig Dispense Refill  . acetaminophen (TYLENOL) 650 MG CR tablet Take 650 mg by mouth every 8 (eight) hours as needed for pain.    Marland Kitchen allopurinol (ZYLOPRIM) 100 MG tablet Take 1 tablet (100 mg total) by mouth 2 (two) times daily. 60 tablet 1  . allopurinol (ZYLOPRIM) 100 MG tablet TAKE ONE TABLET BY MOUTH TWICE DAILY 180 tablet 1  . amiodarone (PACERONE) 200 MG tablet Take 1 tablet (200 mg total) by mouth daily. 90 tablet 1  . Blood Glucose Monitoring Suppl (ONE TOUCH ULTRA 2) W/DEVICE KIT Use to check blood sugars twice a day Dx E11.9 1 each 0  . carvedilol (COREG) 25 MG tablet Take 25 mg by mouth 2 (two) times daily with a meal. TAKE 1/2 TABLET THE NIGHT BEFORE AND MORNING OF DIALYSIS    . collagenase (SANTYL) ointment Apply topically daily. (Patient not taking: Reported on 02/03/2015) 15 g 1  . glucose blood (ONE TOUCH ULTRA TEST) test strip 1 each by Other route 2 (two) times daily. Use to check blood sugars twice a day Dx E11.9 100 each 3  . JANUVIA 25 MG tablet TAKE ONE TABLET BY MOUTH ONCE DAILY 90 tablet 0  . Lancets (ONETOUCH ULTRASOFT) lancets 1 each by Other route 2 (two) times daily. Use to help check blood sugars twice a day Dx E11.9  100 each 3  . loperamide (IMODIUM A-D) 2 MG tablet Take 2 mg by mouth 4 (four) times daily as needed for diarrhea or loose stools.    . ondansetron (ZOFRAN) 4 MG tablet Take 1 tablet (4 mg total) by mouth every 6 (six) hours. (Patient not taking: Reported on 02/03/2015) 30 tablet 0  . ranitidine (ZANTAC) 150 MG tablet Take 150 mg by mouth daily as needed for heartburn. Reported on 02/03/2015    . sevelamer carbonate (RENVELA) 800 MG tablet Take 1,600 mg by mouth 3 (three) times daily with meals.     . simvastatin (ZOCOR) 20 MG tablet TAKE ONE TABLET BY MOUTH ONCE DAILY 90 tablet 0  . sodium chloride (OCEAN) 0.65 % SOLN nasal spray Place 1 spray into both  nostrils as needed for congestion. 104 mL 0  . warfarin (COUMADIN) 3 MG tablet Please take as directed by anticoagulation clinic. (Patient taking differently: Take 3-4.5 mg by mouth daily at 6 PM. Takes 4.40m on thu only Takes 321mall other days) 90 tablet 0   No current facility-administered medications on file prior to visit.    Past Surgical History  Procedure Laterality Date  . Total hip arthroplasty Right 1980  . Av fistula placement Left ~ 10/2013    "forearm"  . Closed reduction hip dislocation Right 1970's  . Cardiac catheterization    . Cardiac catheterization N/A 01/07/2015    Procedure: Left Heart Cath and Coronary Angiography;  Surgeon: ThTroy SineMD;  [facility];  Service: Cardiovascular;  Laterality: N/A;    No Known Allergies  Social History   Social History  . Marital Status: Divorced    Spouse Name: N/A  . Number of Children: 2  . Years of Education: N/A   Occupational History  . Not on file.   Social History Main Topics  . Smoking status: Never Smoker   . Smokeless tobacco: Never Used  . Alcohol Use: 1.8 - 2.4 oz/week    3-4 Standard drinks or equivalent per week     Comment: 01/30/2014 "none in the last 3 weeks; primarily drink w/my friends on the weekends"  . Drug Use: No  . Sexual Activity: Not Currently   Other Topics Concern  . Not on file   Social History Narrative    Family History  Problem Relation Age of Onset  . Arthritis Other   . Heart disease Other   . Hyperlipidemia Other   . Stroke Other   . Hypertension Other   . Kidney disease Other   . Diabetes Other     BP 113/73 mmHg  Pulse 111  Ht '5\' 9"'  (1.753 m)  Wt 250 lb (113.399 kg)  BMI 36.90 kg/m2  Review of Systems: See HPI above.    Objective:  Physical Exam:  Gen: NAD  Right shoulder: No swelling, ecchymoses.  No gross deformity. No TTP. Unable to abduct past 30 degrees.  ER limited to 40 degrees though has full passive ER.  Full  IR. Positive Hawkins, Neers. Negative Speeds, Yergasons. + drop arm sign. Strength 3/5 with ER.  5/5 IR.   Negative apprehension. NV intact distally.  Left shoulder: FROM without pain.   Back: No gross deformity, scoliosis. TTP midline around L2-4 areas.  No paraspinal tenderness FROM with pain on flexion and extension mildly. Strength LEs 5/5 all muscle groups.   1+ MSRs in patellar and achilles tendons, equal bilaterally. Negative SLRs. Sensation intact to light touch bilaterally. Negative logroll  bilateral hips Negative fabers and piriformis stretches.    Assessment & Plan:  1. Right shoulder injury - from fall in March 2016 consistent with supraspinatus and infraspinatus tears clinically.  Unfortunately this is 9 months out and he is a poor surgical candidate given his cardiac issues.  Discussed options and he would like to try subacromial injection for pain.  Shown some home exercises though advised he is unlikely to regain much motion and strength back especially from supraspinatus.  Tylenol as needed for pain.  Consider nitro patches.  After informed written consent, patient was seated on exam table. Right shoulder was prepped with alcohol swab and utilizing posterior approach, patient's right subacromial space was injected with 3:1 marcaine: depomedrol. Patient tolerated the procedure well without immediate complications.  2. Low back pain - L3 compression fracture has stabilized and independent review of radiographs shows this is healing.  Start physical therapy now.  He will increase amitriptyline to 4m at bedtime for his pain - establish care with a PCP.  Used calcitonin initially.  Consider back brace, heat.  Calcium, vitamin D.  Discuss bisphosphonate treatment with PCP as well.

## 2015-02-09 NOTE — Assessment & Plan Note (Signed)
L3 compression fracture has stabilized and independent review of radiographs shows this is healing.  Start physical therapy now.  He will increase amitriptyline to 75mg  at bedtime for his pain - establish care with a PCP.  Used calcitonin initially.  Consider back brace, heat.  Calcium, vitamin D.  Discuss bisphosphonate treatment with PCP as well.

## 2015-02-09 NOTE — Assessment & Plan Note (Signed)
from fall in March 2016 consistent with supraspinatus and infraspinatus tears clinically.  Unfortunately this is 9 months out and he is a poor surgical candidate given his cardiac issues.  Discussed options and he would like to try subacromial injection for pain.  Shown some home exercises though advised he is unlikely to regain much motion and strength back especially from supraspinatus.  Tylenol as needed for pain.  Consider nitro patches.  After informed written consent, patient was seated on exam table. Right shoulder was prepped with alcohol swab and utilizing posterior approach, patient's right subacromial space was injected with 3:1 marcaine: depomedrol. Patient tolerated the procedure well without immediate complications.

## 2015-02-11 DIAGNOSIS — N2581 Secondary hyperparathyroidism of renal origin: Secondary | ICD-10-CM | POA: Diagnosis not present

## 2015-02-11 DIAGNOSIS — E1129 Type 2 diabetes mellitus with other diabetic kidney complication: Secondary | ICD-10-CM | POA: Diagnosis not present

## 2015-02-11 DIAGNOSIS — N186 End stage renal disease: Secondary | ICD-10-CM | POA: Diagnosis not present

## 2015-02-11 DIAGNOSIS — D509 Iron deficiency anemia, unspecified: Secondary | ICD-10-CM | POA: Diagnosis not present

## 2015-02-11 DIAGNOSIS — L03115 Cellulitis of right lower limb: Secondary | ICD-10-CM | POA: Diagnosis not present

## 2015-02-11 DIAGNOSIS — D631 Anemia in chronic kidney disease: Secondary | ICD-10-CM | POA: Diagnosis not present

## 2015-02-12 DIAGNOSIS — N186 End stage renal disease: Secondary | ICD-10-CM | POA: Diagnosis not present

## 2015-02-12 DIAGNOSIS — I120 Hypertensive chronic kidney disease with stage 5 chronic kidney disease or end stage renal disease: Secondary | ICD-10-CM | POA: Diagnosis not present

## 2015-02-12 DIAGNOSIS — Z992 Dependence on renal dialysis: Secondary | ICD-10-CM | POA: Diagnosis not present

## 2015-02-14 DIAGNOSIS — N2581 Secondary hyperparathyroidism of renal origin: Secondary | ICD-10-CM | POA: Diagnosis not present

## 2015-02-14 DIAGNOSIS — D509 Iron deficiency anemia, unspecified: Secondary | ICD-10-CM | POA: Diagnosis not present

## 2015-02-14 DIAGNOSIS — D631 Anemia in chronic kidney disease: Secondary | ICD-10-CM | POA: Diagnosis not present

## 2015-02-14 DIAGNOSIS — E876 Hypokalemia: Secondary | ICD-10-CM | POA: Diagnosis not present

## 2015-02-14 DIAGNOSIS — N186 End stage renal disease: Secondary | ICD-10-CM | POA: Diagnosis not present

## 2015-02-16 DIAGNOSIS — E876 Hypokalemia: Secondary | ICD-10-CM | POA: Diagnosis not present

## 2015-02-16 DIAGNOSIS — D509 Iron deficiency anemia, unspecified: Secondary | ICD-10-CM | POA: Diagnosis not present

## 2015-02-16 DIAGNOSIS — N186 End stage renal disease: Secondary | ICD-10-CM | POA: Diagnosis not present

## 2015-02-16 DIAGNOSIS — D631 Anemia in chronic kidney disease: Secondary | ICD-10-CM | POA: Diagnosis not present

## 2015-02-16 DIAGNOSIS — N2581 Secondary hyperparathyroidism of renal origin: Secondary | ICD-10-CM | POA: Diagnosis not present

## 2015-02-18 DIAGNOSIS — N186 End stage renal disease: Secondary | ICD-10-CM | POA: Diagnosis not present

## 2015-02-18 DIAGNOSIS — D509 Iron deficiency anemia, unspecified: Secondary | ICD-10-CM | POA: Diagnosis not present

## 2015-02-18 DIAGNOSIS — E876 Hypokalemia: Secondary | ICD-10-CM | POA: Diagnosis not present

## 2015-02-18 DIAGNOSIS — N2581 Secondary hyperparathyroidism of renal origin: Secondary | ICD-10-CM | POA: Diagnosis not present

## 2015-02-18 DIAGNOSIS — D631 Anemia in chronic kidney disease: Secondary | ICD-10-CM | POA: Diagnosis not present

## 2015-02-21 ENCOUNTER — Other Ambulatory Visit: Payer: Self-pay | Admitting: Internal Medicine

## 2015-02-21 ENCOUNTER — Encounter (HOSPITAL_BASED_OUTPATIENT_CLINIC_OR_DEPARTMENT_OTHER): Payer: Medicare Other | Attending: Internal Medicine

## 2015-02-21 ENCOUNTER — Ambulatory Visit: Payer: Medicare Other | Admitting: Physical Therapy

## 2015-02-21 DIAGNOSIS — I509 Heart failure, unspecified: Secondary | ICD-10-CM | POA: Insufficient documentation

## 2015-02-21 DIAGNOSIS — N2581 Secondary hyperparathyroidism of renal origin: Secondary | ICD-10-CM | POA: Diagnosis not present

## 2015-02-21 DIAGNOSIS — E1122 Type 2 diabetes mellitus with diabetic chronic kidney disease: Secondary | ICD-10-CM | POA: Diagnosis not present

## 2015-02-21 DIAGNOSIS — E1140 Type 2 diabetes mellitus with diabetic neuropathy, unspecified: Secondary | ICD-10-CM | POA: Insufficient documentation

## 2015-02-21 DIAGNOSIS — I252 Old myocardial infarction: Secondary | ICD-10-CM | POA: Insufficient documentation

## 2015-02-21 DIAGNOSIS — L97212 Non-pressure chronic ulcer of right calf with fat layer exposed: Secondary | ICD-10-CM | POA: Insufficient documentation

## 2015-02-21 DIAGNOSIS — E876 Hypokalemia: Secondary | ICD-10-CM | POA: Diagnosis not present

## 2015-02-21 DIAGNOSIS — I132 Hypertensive heart and chronic kidney disease with heart failure and with stage 5 chronic kidney disease, or end stage renal disease: Secondary | ICD-10-CM | POA: Diagnosis not present

## 2015-02-21 DIAGNOSIS — M109 Gout, unspecified: Secondary | ICD-10-CM | POA: Diagnosis not present

## 2015-02-21 DIAGNOSIS — L905 Scar conditions and fibrosis of skin: Secondary | ICD-10-CM | POA: Diagnosis not present

## 2015-02-21 DIAGNOSIS — M1990 Unspecified osteoarthritis, unspecified site: Secondary | ICD-10-CM | POA: Insufficient documentation

## 2015-02-21 DIAGNOSIS — E11622 Type 2 diabetes mellitus with other skin ulcer: Secondary | ICD-10-CM | POA: Insufficient documentation

## 2015-02-21 DIAGNOSIS — Z992 Dependence on renal dialysis: Secondary | ICD-10-CM | POA: Diagnosis not present

## 2015-02-21 DIAGNOSIS — D509 Iron deficiency anemia, unspecified: Secondary | ICD-10-CM | POA: Diagnosis not present

## 2015-02-21 DIAGNOSIS — E1121 Type 2 diabetes mellitus with diabetic nephropathy: Secondary | ICD-10-CM | POA: Insufficient documentation

## 2015-02-21 DIAGNOSIS — Z8673 Personal history of transient ischemic attack (TIA), and cerebral infarction without residual deficits: Secondary | ICD-10-CM | POA: Diagnosis not present

## 2015-02-21 DIAGNOSIS — I87311 Chronic venous hypertension (idiopathic) with ulcer of right lower extremity: Secondary | ICD-10-CM | POA: Insufficient documentation

## 2015-02-21 DIAGNOSIS — Z7901 Long term (current) use of anticoagulants: Secondary | ICD-10-CM | POA: Diagnosis not present

## 2015-02-21 DIAGNOSIS — N186 End stage renal disease: Secondary | ICD-10-CM | POA: Diagnosis not present

## 2015-02-21 DIAGNOSIS — G4730 Sleep apnea, unspecified: Secondary | ICD-10-CM | POA: Insufficient documentation

## 2015-02-21 DIAGNOSIS — H409 Unspecified glaucoma: Secondary | ICD-10-CM | POA: Diagnosis not present

## 2015-02-21 DIAGNOSIS — L97812 Non-pressure chronic ulcer of other part of right lower leg with fat layer exposed: Secondary | ICD-10-CM | POA: Diagnosis not present

## 2015-02-21 DIAGNOSIS — D631 Anemia in chronic kidney disease: Secondary | ICD-10-CM | POA: Diagnosis not present

## 2015-02-23 ENCOUNTER — Ambulatory Visit: Payer: Medicare Other | Attending: Family Medicine | Admitting: Physical Therapy

## 2015-02-23 DIAGNOSIS — R269 Unspecified abnormalities of gait and mobility: Secondary | ICD-10-CM

## 2015-02-23 DIAGNOSIS — D509 Iron deficiency anemia, unspecified: Secondary | ICD-10-CM | POA: Diagnosis not present

## 2015-02-23 DIAGNOSIS — M25562 Pain in left knee: Secondary | ICD-10-CM | POA: Diagnosis not present

## 2015-02-23 DIAGNOSIS — M545 Low back pain: Secondary | ICD-10-CM | POA: Diagnosis not present

## 2015-02-23 DIAGNOSIS — D631 Anemia in chronic kidney disease: Secondary | ICD-10-CM | POA: Diagnosis not present

## 2015-02-23 DIAGNOSIS — E876 Hypokalemia: Secondary | ICD-10-CM | POA: Diagnosis not present

## 2015-02-23 DIAGNOSIS — N186 End stage renal disease: Secondary | ICD-10-CM | POA: Diagnosis not present

## 2015-02-23 DIAGNOSIS — M25561 Pain in right knee: Secondary | ICD-10-CM | POA: Diagnosis not present

## 2015-02-23 DIAGNOSIS — R29898 Other symptoms and signs involving the musculoskeletal system: Secondary | ICD-10-CM | POA: Diagnosis not present

## 2015-02-23 DIAGNOSIS — N2581 Secondary hyperparathyroidism of renal origin: Secondary | ICD-10-CM | POA: Diagnosis not present

## 2015-02-23 DIAGNOSIS — R262 Difficulty in walking, not elsewhere classified: Secondary | ICD-10-CM

## 2015-02-23 DIAGNOSIS — G8929 Other chronic pain: Secondary | ICD-10-CM

## 2015-02-23 NOTE — Therapy (Addendum)
[facility] [address]  [place], [place], [address] Phone: [phone]   Fax:  [phone]  Physical Therapy Treatment  Patient Details  Name: Daniel Whitney MRN: [id number] Date of Birth: [birth year] Referring Provider: Karlton Lemon  Encounter Date: 02/23/2015      PT End of Session - 02/23/15 1412    Visit Number 3   Number of Visits 12   Date for PT Re-Evaluation 03/31/15   PT Start Time 1409   PT Stop Time 1454   PT Time Calculation (min) 45 min      Past Medical History  Diagnosis Date  . Glaucoma   . Hypertension   . Hyperlipidemia   . Atrial fibrillation ([place]) 12/30/2012  . CHF (congestive heart failure) ([place]) 12/30/2012  . Diabetic retinopathy ([place]) 12/30/2012  . Morbid obesity ([place]) 12/30/2012  . Pulmonary hypertension ([place]) 12/30/2012  . Myocardial infarction ([place])     "I've had ~ 3; last one was in ~ 10/2013" (01/30/2014)  . Diabetes mellitus with neuropathy ([place])   . OSA (obstructive sleep apnea) 12/30/2012    "lost weight; no longer needed CPAP; retested said I needed it; didn't followup cause I was feeling fine" (01/30/2014)  . GERD (gastroesophageal reflux disease)   . Stroke [facility]) ~ 2005; ~ 2005    "they were mild; I didn't even notice I'd had them"; denies residual on 01/30/2014  . History of gout     "right big toe"  . Arthritis     "all over"  . Chronic lower back pain   . ESRD (end stage renal disease) on dialysis ([place]) started in 2013    MWF; [facility]; [employer]  . Refusal of blood transfusions as patient is Jehovah's Witness   . Anemia due to other cause 07/07/2014  . Allergic rhinitis 07/07/2014  . Hypothyroid     Past Surgical History  Procedure Laterality Date  . Total hip arthroplasty Right 1980  . Av fistula placement Left ~ 10/2013    "forearm"  . Closed reduction hip dislocation Right 1970's  . Cardiac catheterization    . Cardiac catheterization N/A 01/07/2015     Procedure: Left Heart Cath and Coronary Angiography;  Surgeon: Troy Sine, MD;  Location: [facility];  Service: Cardiovascular;  Laterality: N/A;    There were no vitals filed for this visit.  Visit Diagnosis:  Arthralgia of both knees  Chronic lower back pain  Difficulty walking  Abnormality of gait  Weakness of right hip      Subjective Assessment - 02/23/15 1411    Currently in Pain? Yes   Pain Score --  6-7/10   Pain Location Knee   Pain Orientation Left;Right   Pain Score --  4-5/10   Pain Location Back   Pain Orientation Lower             TODAY'S TREATMENT TherEx - Bridge 15x B FOB (55cm) Tuck AROM 15x B FOB (55cm) Bridge 15x B SAQ 0# 10x; (mild pain in R so no wt added to R) L SAQ 3# 10x Stretch B HS and SKTC Partial Curl-up 20x Supine Hip ABD/ADD RROM 2x10 each Seated Fitter Unilateral Leg Press 1 Blue + Black 20x each, 2 Black 20x each B Knee Flexion Machine 25# 20x, 35# 15x Machine Low Row 45# 15x Machine unilateral Low Row 25# 2x15 each Staggered Standing one-arm row (HEP) Black TB 10x each HEP instruction  PT Education - 02/23/15 1624    Education provided Yes   Education Details HEP update along with one-arm row   Person(s) Educated Patient   Methods Explanation;Demonstration;Handout   Comprehension Verbalized understanding;Returned demonstration          PT Short Term Goals - 02/08/15 1659    PT SHORT TERM GOAL #1   Title pt independent with initial HEP by 02/12/15   Status Achieved           PT Long Term Goals - 02/08/15 1659    PT LONG TERM GOAL #1   Title pt independent with advanced HEP as necessary by 03/31/15   Status On-going   PT LONG TERM GOAL #2   Title pt able to ambulate within community with use of Rollator without c/o knee pain greater than 4/10 at worst by 03/31/15   Status On-going   PT LONG TERM GOAL #3   Title pt displays B hip and knee MMT 4+/5 or better  grossly by 03/31/15   Status On-going   PT LONG TERM GOAL #4   Title pt reports LBP improves to no greater than 2/10 on AVG and 4/10 at worst by 03/31/15   Status On-going               Plan - 02/23/15 1451    Clinical Impression Statement very motivated, good performance with exercises without c/o pain; however, does report higher pain values today vs last treatment.   PT Next Visit Plan lumbopelvic stability training; HS stretch; B Hip strengthening to tolerance   Consulted and Agree with Plan of Care Patient        Problem List Patient Active Problem List   Diagnosis Date Noted  . Low back pain 02/09/2015  . Bilateral knee pain 01/27/2015  . Atypical atrial flutter ([place]) 01/24/2015  . Chest pain   . Acute on chronic combined systolic and diastolic heart failure ([place]) 01/05/2015  . Hyperkalemia 11/08/2014  . End stage renal disease on dialysis ([place]) 11/08/2014  . Hypocalcemia 11/08/2014  . Weakness 11/08/2014  . Recurrent falls 10/13/2014  . Venous stasis ulcer ([place]) 10/13/2014  . Hypoxia   . Leukocytosis   . Respiratory failure ([place])   . Cardiac arrest ([place])   . ARDS (adult respiratory distress syndrome) ([place])   . Encounter for central line placement   . Asystole ([place]) 08/13/2014  . Cardiopulmonary arrest ([place])   . Compression fracture of L3 lumbar vertebra ([place]) 08/04/2014  . Anemia due to other cause 07/07/2014  . Allergic rhinitis 07/07/2014  . Right rotator cuff tear 04/27/2014  . SOB (shortness of breath) 01/29/2014  . Acute gouty arthritis 11/11/2013  . Hearing loss in right ear 11/11/2013  . Chronic pain syndrome 11/11/2013  . Right shoulder pain 11/11/2013  . Facial rash 11/01/2013  . Right otitis externa 10/27/2013  . Pulmonary HTN- PA 58 mmHg 04/16/13 09/17/2013  . Right hip pain 08/04/2013  . DOE (dyspnea on exertion) 06/18/2013  . Allergic rhinitis, cause unspecified 06/18/2013  . Carpal tunnel syndrome 06/06/2013  . Leg pain, bilateral  06/06/2013  . Primary localized osteoarthrosis, lower leg 06/04/2013  . Encounter for therapeutic drug monitoring 03/10/2013  . Preventative health care 12/30/2012  . Atrial fibrillation- failed Amiodarone 12/30/2012  . CHF (congestive heart failure) ([place]) 12/30/2012  . Diabetic retinopathy ([place]) 12/30/2012  . Morbid obesity ([place]) 12/30/2012  . OSA (obstructive sleep apnea)- on C-pap 12/30/2012  . Pulmonary hypertension ([place]) 12/30/2012  . Bilateral hearing loss  12/30/2012  . Long term (current) use of anticoagulants 12/30/2012  . Arthritis   . Glaucoma   . Diabetes mellitus with neuropathy ([place])   . Hypertension   . ESRD (end stage renal disease) on dialysis ([place])   . Stroke ([place])   . Hyperlipidemia     Rahul Malinak PT, OCS 02/23/2015, 4:25 PM  [facility] [address]  [place], [place], [address] Phone: [phone]   Fax:  [phone]  Name: Daniel Whitney MRN: [id number] Date of Birth: 08-07-1955   PHYSICAL THERAPY DISCHARGE SUMMARY  Visits from Start of Care: 3  Current functional level related to goals / functional outcomes: Motivated but experiences continued pain with functional activities    Remaining deficits: Continued pain    Education / Equipment: HEP  Plan: Patient agrees to discharge.  Patient goals were not met. Patient is being discharged due to not returning since the last visit.  ?????     Celyn P. Helene Kelp PT, MPH 04/13/2015 3:51 PM

## 2015-02-24 ENCOUNTER — Ambulatory Visit: Payer: Medicare Other | Admitting: Family Medicine

## 2015-02-25 DIAGNOSIS — N2581 Secondary hyperparathyroidism of renal origin: Secondary | ICD-10-CM | POA: Diagnosis not present

## 2015-02-25 DIAGNOSIS — N186 End stage renal disease: Secondary | ICD-10-CM | POA: Diagnosis not present

## 2015-03-01 ENCOUNTER — Encounter: Payer: Medicare Other | Admitting: Physical Therapy

## 2015-03-01 DIAGNOSIS — N186 End stage renal disease: Secondary | ICD-10-CM | POA: Diagnosis not present

## 2015-03-01 DIAGNOSIS — N2581 Secondary hyperparathyroidism of renal origin: Secondary | ICD-10-CM | POA: Diagnosis not present

## 2015-03-01 DIAGNOSIS — L03115 Cellulitis of right lower limb: Secondary | ICD-10-CM | POA: Diagnosis not present

## 2015-03-01 DIAGNOSIS — D631 Anemia in chronic kidney disease: Secondary | ICD-10-CM | POA: Diagnosis not present

## 2015-03-03 ENCOUNTER — Ambulatory Visit: Payer: Medicare Other | Admitting: Physical Therapy

## 2015-03-03 DIAGNOSIS — L03115 Cellulitis of right lower limb: Secondary | ICD-10-CM | POA: Diagnosis not present

## 2015-03-03 DIAGNOSIS — N2581 Secondary hyperparathyroidism of renal origin: Secondary | ICD-10-CM | POA: Diagnosis not present

## 2015-03-03 DIAGNOSIS — N186 End stage renal disease: Secondary | ICD-10-CM | POA: Diagnosis not present

## 2015-03-03 DIAGNOSIS — D631 Anemia in chronic kidney disease: Secondary | ICD-10-CM | POA: Diagnosis not present

## 2015-03-05 DIAGNOSIS — N2581 Secondary hyperparathyroidism of renal origin: Secondary | ICD-10-CM | POA: Diagnosis not present

## 2015-03-05 DIAGNOSIS — D631 Anemia in chronic kidney disease: Secondary | ICD-10-CM | POA: Diagnosis not present

## 2015-03-05 DIAGNOSIS — N186 End stage renal disease: Secondary | ICD-10-CM | POA: Diagnosis not present

## 2015-03-05 DIAGNOSIS — L03115 Cellulitis of right lower limb: Secondary | ICD-10-CM | POA: Diagnosis not present

## 2015-03-08 ENCOUNTER — Ambulatory Visit: Payer: Medicare Other | Admitting: Physical Therapy

## 2015-03-09 DIAGNOSIS — D631 Anemia in chronic kidney disease: Secondary | ICD-10-CM | POA: Diagnosis not present

## 2015-03-09 DIAGNOSIS — N186 End stage renal disease: Secondary | ICD-10-CM | POA: Diagnosis not present

## 2015-03-09 DIAGNOSIS — N2581 Secondary hyperparathyroidism of renal origin: Secondary | ICD-10-CM | POA: Diagnosis not present

## 2015-03-09 DIAGNOSIS — L03115 Cellulitis of right lower limb: Secondary | ICD-10-CM | POA: Diagnosis not present

## 2015-03-14 DIAGNOSIS — N186 End stage renal disease: Secondary | ICD-10-CM | POA: Diagnosis not present

## 2015-03-14 DIAGNOSIS — L03115 Cellulitis of right lower limb: Secondary | ICD-10-CM | POA: Diagnosis not present

## 2015-03-14 DIAGNOSIS — N2581 Secondary hyperparathyroidism of renal origin: Secondary | ICD-10-CM | POA: Diagnosis not present

## 2015-03-14 DIAGNOSIS — D631 Anemia in chronic kidney disease: Secondary | ICD-10-CM | POA: Diagnosis not present

## 2015-03-15 DIAGNOSIS — I120 Hypertensive chronic kidney disease with stage 5 chronic kidney disease or end stage renal disease: Secondary | ICD-10-CM | POA: Diagnosis not present

## 2015-03-15 DIAGNOSIS — Z992 Dependence on renal dialysis: Secondary | ICD-10-CM | POA: Diagnosis not present

## 2015-03-15 DIAGNOSIS — N186 End stage renal disease: Secondary | ICD-10-CM | POA: Diagnosis not present

## 2015-03-16 ENCOUNTER — Ambulatory Visit: Payer: Medicare Other | Admitting: Family Medicine

## 2015-03-16 ENCOUNTER — Ambulatory Visit: Payer: Medicare Other | Attending: Family Medicine | Admitting: Physical Therapy

## 2015-03-16 DIAGNOSIS — D509 Iron deficiency anemia, unspecified: Secondary | ICD-10-CM | POA: Diagnosis not present

## 2015-03-16 DIAGNOSIS — N2581 Secondary hyperparathyroidism of renal origin: Secondary | ICD-10-CM | POA: Diagnosis not present

## 2015-03-16 DIAGNOSIS — E876 Hypokalemia: Secondary | ICD-10-CM | POA: Diagnosis not present

## 2015-03-16 DIAGNOSIS — D631 Anemia in chronic kidney disease: Secondary | ICD-10-CM | POA: Diagnosis not present

## 2015-03-16 DIAGNOSIS — N186 End stage renal disease: Secondary | ICD-10-CM | POA: Diagnosis not present

## 2015-03-16 DIAGNOSIS — E1129 Type 2 diabetes mellitus with other diabetic kidney complication: Secondary | ICD-10-CM | POA: Diagnosis not present

## 2015-03-19 DIAGNOSIS — E876 Hypokalemia: Secondary | ICD-10-CM | POA: Diagnosis not present

## 2015-03-19 DIAGNOSIS — D631 Anemia in chronic kidney disease: Secondary | ICD-10-CM | POA: Diagnosis not present

## 2015-03-19 DIAGNOSIS — D509 Iron deficiency anemia, unspecified: Secondary | ICD-10-CM | POA: Diagnosis not present

## 2015-03-19 DIAGNOSIS — N2581 Secondary hyperparathyroidism of renal origin: Secondary | ICD-10-CM | POA: Diagnosis not present

## 2015-03-19 DIAGNOSIS — N186 End stage renal disease: Secondary | ICD-10-CM | POA: Diagnosis not present

## 2015-03-19 DIAGNOSIS — E1129 Type 2 diabetes mellitus with other diabetic kidney complication: Secondary | ICD-10-CM | POA: Diagnosis not present

## 2015-03-21 ENCOUNTER — Encounter (HOSPITAL_BASED_OUTPATIENT_CLINIC_OR_DEPARTMENT_OTHER): Payer: Medicare Other | Attending: Internal Medicine

## 2015-03-21 ENCOUNTER — Ambulatory Visit (INDEPENDENT_AMBULATORY_CARE_PROVIDER_SITE_OTHER): Payer: Medicare Other | Admitting: Family Medicine

## 2015-03-21 ENCOUNTER — Encounter: Payer: Self-pay | Admitting: Family Medicine

## 2015-03-21 VITALS — BP 128/75 | HR 108 | Ht 69.0 in | Wt 241.0 lb

## 2015-03-21 DIAGNOSIS — E876 Hypokalemia: Secondary | ICD-10-CM | POA: Diagnosis not present

## 2015-03-21 DIAGNOSIS — E1129 Type 2 diabetes mellitus with other diabetic kidney complication: Secondary | ICD-10-CM | POA: Diagnosis not present

## 2015-03-21 DIAGNOSIS — I252 Old myocardial infarction: Secondary | ICD-10-CM | POA: Insufficient documentation

## 2015-03-21 DIAGNOSIS — N186 End stage renal disease: Secondary | ICD-10-CM | POA: Diagnosis not present

## 2015-03-21 DIAGNOSIS — N2581 Secondary hyperparathyroidism of renal origin: Secondary | ICD-10-CM | POA: Diagnosis not present

## 2015-03-21 DIAGNOSIS — M545 Low back pain, unspecified: Secondary | ICD-10-CM

## 2015-03-21 DIAGNOSIS — M1990 Unspecified osteoarthritis, unspecified site: Secondary | ICD-10-CM | POA: Insufficient documentation

## 2015-03-21 DIAGNOSIS — D509 Iron deficiency anemia, unspecified: Secondary | ICD-10-CM | POA: Diagnosis not present

## 2015-03-21 DIAGNOSIS — E1140 Type 2 diabetes mellitus with diabetic neuropathy, unspecified: Secondary | ICD-10-CM | POA: Insufficient documentation

## 2015-03-21 DIAGNOSIS — E1122 Type 2 diabetes mellitus with diabetic chronic kidney disease: Secondary | ICD-10-CM | POA: Insufficient documentation

## 2015-03-21 DIAGNOSIS — M25561 Pain in right knee: Secondary | ICD-10-CM | POA: Diagnosis not present

## 2015-03-21 DIAGNOSIS — I509 Heart failure, unspecified: Secondary | ICD-10-CM | POA: Insufficient documentation

## 2015-03-21 DIAGNOSIS — M25562 Pain in left knee: Secondary | ICD-10-CM

## 2015-03-21 DIAGNOSIS — D631 Anemia in chronic kidney disease: Secondary | ICD-10-CM | POA: Diagnosis not present

## 2015-03-21 DIAGNOSIS — I132 Hypertensive heart and chronic kidney disease with heart failure and with stage 5 chronic kidney disease, or end stage renal disease: Secondary | ICD-10-CM | POA: Insufficient documentation

## 2015-03-21 DIAGNOSIS — L97212 Non-pressure chronic ulcer of right calf with fat layer exposed: Secondary | ICD-10-CM | POA: Insufficient documentation

## 2015-03-21 DIAGNOSIS — G4730 Sleep apnea, unspecified: Secondary | ICD-10-CM | POA: Insufficient documentation

## 2015-03-21 DIAGNOSIS — Z992 Dependence on renal dialysis: Secondary | ICD-10-CM | POA: Insufficient documentation

## 2015-03-21 DIAGNOSIS — H409 Unspecified glaucoma: Secondary | ICD-10-CM | POA: Insufficient documentation

## 2015-03-21 DIAGNOSIS — E11622 Type 2 diabetes mellitus with other skin ulcer: Secondary | ICD-10-CM | POA: Insufficient documentation

## 2015-03-21 DIAGNOSIS — I87311 Chronic venous hypertension (idiopathic) with ulcer of right lower extremity: Secondary | ICD-10-CM | POA: Insufficient documentation

## 2015-03-21 MED ORDER — DIAZEPAM 5 MG PO TABS: ORAL_TABLET | ORAL | Status: DC

## 2015-03-21 NOTE — Patient Instructions (Signed)
We will get approval for the gel shots in your knees and give you a call. We will also do the MRI of your lumbar spine. Follow up will depend on when those gel shots come in - you will do the shots once a week for 3 weeks.

## 2015-03-23 NOTE — Progress Notes (Addendum)
PCP: Cathlean Cower, MD  Subjective:   HPI: Patient is a 60 y.o. male here for right shoulder, low back pain.  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 Previously seen by Dr. Tamala Julian but was dismissed from [facility] for missing appointments. Last cortisone injections over 3 months ago. Has done viscosupplementation as well with some relief. Has had over 10 years of grinding R > L knee pain. Pain level 10/10 on right, 6/10 on left, sharp. Worse with standing, prolonged sitting and going to get up. No skin changes, fever, other complaints.  12/21: Patient has had problems with right shoulder dating back at least to early March. He sustained a fall at that time with severe pain in right shoulder and arm. Radiographs had a small joint effusion, chronic Hill-Sachs deformity. After this fall he could no longer lift right arm and has continued to have this problem. Pain level 7/10, sharp lateral shoulder. MSK u/s by Dr. Tamala Julian showed a full thickness supraspinatus tear that was retracted, subscapularis tear. He has also had low back pain central mid-low back at 7/10 level, sharp as well. Had compression fracture diagnosed by ED in June. Some radiation into right hip area. Used calcitonin initially. No bowel/bladder dysfunction. No numbness/tingling.  03/21/15: Patient returns with continued pain, swelling in both knees to 7/10 level. Injections only helped temporarily. Low back still hurts as well centrally. No radiation into extremities - pain also 7/10 and sharp. No bowel/bladder dysfunction. No skin changes, fever. No numbness/tingling.  Past Medical History  Diagnosis Date  . Glaucoma   . Hypertension   . Hyperlipidemia   . Atrial fibrillation ([place]) 12/30/2012  . CHF (congestive heart failure) ([place]) 12/30/2012  . Diabetic retinopathy ([place]) 12/30/2012  . Morbid obesity ([place]) 12/30/2012  . Pulmonary hypertension ([place]) 12/30/2012  . Myocardial infarction ([place])    "I've had ~ 3; last one was in ~ 10/2013" (01/30/2014)  . Diabetes mellitus with neuropathy ([place])   . OSA (obstructive sleep apnea) 12/30/2012    "lost weight; no longer needed CPAP; retested said I needed it; didn't followup cause I was feeling fine" (01/30/2014)  . GERD (gastroesophageal reflux disease)   . Stroke [facility]) ~ 2005; ~ 2005    "they were mild; I didn't even notice I'd had them"; denies residual on 01/30/2014  . History of gout     "right big toe"  . Arthritis     "all over"  . Chronic lower back pain   . ESRD (end stage renal disease) on dialysis ([place]) started in 2013    MWF; [facility]; [employer]  . Refusal of blood transfusions as patient is Jehovah's Witness   . Anemia due to other cause 07/07/2014  . Allergic rhinitis 07/07/2014  . Hypothyroid     Current Outpatient Prescriptions on File Prior to Visit  Medication Sig Dispense Refill  . acetaminophen (TYLENOL) 650 MG CR tablet Take 650 mg by mouth every 8 (eight) hours as needed for pain.    Marland Kitchen allopurinol (ZYLOPRIM) 100 MG tablet Take 1 tablet (100 mg total) by mouth 2 (two) times daily. 60 tablet 1  . allopurinol (ZYLOPRIM) 100 MG tablet TAKE ONE TABLET BY MOUTH TWICE DAILY 180 tablet 1  . amiodarone (PACERONE) 200 MG tablet Take 1 tablet (200 mg total) by mouth daily. 90 tablet 1  . amitriptyline (ELAVIL) 75 MG tablet Take 1 tablet (75 mg total) by mouth at bedtime. 90 tablet 0  . Blood Glucose Monitoring Suppl (ONE TOUCH ULTRA  2) W/DEVICE KIT Use to check blood sugars twice a day Dx E11.9 1 each 0  . carvedilol (COREG) 25 MG tablet Take 25 mg by mouth 2 (two) times daily with a meal. TAKE 1/2 TABLET THE NIGHT BEFORE AND MORNING OF DIALYSIS    . collagenase (SANTYL) ointment Apply topically daily. (Patient not taking: Reported on 02/03/2015) 15 g 1  . glucose blood (ONE TOUCH ULTRA TEST) test strip 1 each by Other route 2 (two) times daily. Use to check blood sugars twice a day Dx E11.9 100 each 3  . JANUVIA 25 MG  tablet TAKE ONE TABLET BY MOUTH ONCE DAILY 90 tablet 0  . Lancets (ONETOUCH ULTRASOFT) lancets 1 each by Other route 2 (two) times daily. Use to help check blood sugars twice a day Dx E11.9 100 each 3  . loperamide (IMODIUM A-D) 2 MG tablet Take 2 mg by mouth 4 (four) times daily as needed for diarrhea or loose stools.    . ondansetron (ZOFRAN) 4 MG tablet Take 1 tablet (4 mg total) by mouth every 6 (six) hours. (Patient not taking: Reported on 02/03/2015) 30 tablet 0  . ranitidine (ZANTAC) 150 MG tablet Take 150 mg by mouth daily as needed for heartburn. Reported on 02/03/2015    . sevelamer carbonate (RENVELA) 800 MG tablet Take 1,600 mg by mouth 3 (three) times daily with meals.     . simvastatin (ZOCOR) 20 MG tablet TAKE ONE TABLET BY MOUTH ONCE DAILY 90 tablet 0  . sodium chloride (OCEAN) 0.65 % SOLN nasal spray Place 1 spray into both nostrils as needed for congestion. 104 mL 0  . warfarin (COUMADIN) 3 MG tablet Please take as directed by anticoagulation clinic. (Patient taking differently: Take 3-4.5 mg by mouth daily at 6 PM. Takes 4.52m on thu only Takes 360mall other days) 90 tablet 0   No current facility-administered medications on file prior to visit.    Past Surgical History  Procedure Laterality Date  . Total hip arthroplasty Right 1980  . Av fistula placement Left ~ 10/2013    "forearm"  . Closed reduction hip dislocation Right 1970's  . Cardiac catheterization    . Cardiac catheterization N/A 01/07/2015    Procedure: Left Heart Cath and Coronary Angiography;  Surgeon: ThTroy SineMD;  [facility];  Service: Cardiovascular;  Laterality: N/A;    No Known Allergies  Social History   Social History  . Marital Status: Divorced    Spouse Name: N/A  . Number of Children: 2  . Years of Education: N/A   Occupational History  . Not on file.   Social History Main Topics  . Smoking status: Never Smoker   . Smokeless tobacco: Never Used  . Alcohol  Use: 1.8 - 2.4 oz/week    3-4 Standard drinks or equivalent per week     Comment: 01/30/2014 "none in the last 3 weeks; primarily drink w/my friends on the weekends"  . Drug Use: No  . Sexual Activity: Not Currently   Other Topics Concern  . Not on file   Social History Narrative    Family History  Problem Relation Age of Onset  . Arthritis Other   . Heart disease Other   . Hyperlipidemia Other   . Stroke Other   . Hypertension Other   . Kidney disease Other   . Diabetes Other     BP 128/75 mmHg  Pulse 108  Ht '5\' 9"'  (1.753 m)  Wt  241 lb (109.317 kg)  BMI 35.57 kg/m2  Review of Systems: See HPI above.    Objective:  Physical Exam:  Gen: NAD  Back: No gross deformity, scoliosis. TTP midline around L2-4 areas.  No paraspinal tenderness 1+ MSRs in patellar and achilles tendons, equal bilaterally. Negative SLRs. Sensation intact to light touch bilaterally. Negative logroll bilateral hips  Right knee: Mod effusion.  No bruising, other deformity. Mild medial > lateral joint line tenderness. ROM 0 - 100 degrees. Negative ant/post drawers. Negative valgus/varus testing. Negative lachmanns. Negative mcmurrays, apleys, patellar apprehension. NV intact distally.  Left knee: Mod effusion.  No bruising, other deformity. Mild medial > lateral joint line tenderness. ROM 0 - 100 degrees. Negative ant/post drawers. Negative valgus/varus testing. Negative lachmanns. Negative mcmurrays, apleys, patellar apprehension. NV intact distally.    Assessment & Plan:  1. Low back pain - L3 compression fracture has stabilized and prior independent review of radiographs shows this is healing.  Did some physical therapy and home exercises but still has pain.  Taking amitriptyline 27m at bedtime.  Will go ahead with MRI lumbar spine to assess for other abnormalities.  2. Bilateral knee pain - 2/2 severe DJD confirmed with left knee radiographs showing tricompartmental severe DJD in  June.  He is a poor surgical candidate.  Cortisone injections with minimal benefit to date.  Will get approval for viscosupplementation.  Addendum:  MRI reviewed and discussed with patient.  Compression fracture now healed.  He has moderate-advanced DDD at L4-5 with mild bilateral foraminal stenosis.  Recommended continued home exercises, offered physical therapy.  He would like to try ESIs bilaterally at this level - will arrange.

## 2015-03-23 NOTE — Assessment & Plan Note (Signed)
L3 compression fracture has stabilized and prior independent review of radiographs shows this is healing.  Did some physical therapy and home exercises but still has pain.  Taking amitriptyline 75mg  at bedtime.  Will go ahead with MRI lumbar spine to assess for other abnormalities.

## 2015-03-23 NOTE — Assessment & Plan Note (Signed)
2/2 severe DJD confirmed with left knee radiographs showing tricompartmental severe DJD in June.  He is a poor surgical candidate.  Cortisone injections with minimal benefit to date.  Will get approval for viscosupplementation.

## 2015-03-25 DIAGNOSIS — E876 Hypokalemia: Secondary | ICD-10-CM | POA: Diagnosis not present

## 2015-03-25 DIAGNOSIS — E1129 Type 2 diabetes mellitus with other diabetic kidney complication: Secondary | ICD-10-CM | POA: Diagnosis not present

## 2015-03-25 DIAGNOSIS — D631 Anemia in chronic kidney disease: Secondary | ICD-10-CM | POA: Diagnosis not present

## 2015-03-25 DIAGNOSIS — N2581 Secondary hyperparathyroidism of renal origin: Secondary | ICD-10-CM | POA: Diagnosis not present

## 2015-03-25 DIAGNOSIS — N186 End stage renal disease: Secondary | ICD-10-CM | POA: Diagnosis not present

## 2015-03-25 DIAGNOSIS — D509 Iron deficiency anemia, unspecified: Secondary | ICD-10-CM | POA: Diagnosis not present

## 2015-03-26 ENCOUNTER — Ambulatory Visit (HOSPITAL_BASED_OUTPATIENT_CLINIC_OR_DEPARTMENT_OTHER): Payer: Medicare Other

## 2015-03-28 DIAGNOSIS — D509 Iron deficiency anemia, unspecified: Secondary | ICD-10-CM | POA: Diagnosis not present

## 2015-03-28 DIAGNOSIS — N186 End stage renal disease: Secondary | ICD-10-CM | POA: Diagnosis not present

## 2015-03-28 DIAGNOSIS — D631 Anemia in chronic kidney disease: Secondary | ICD-10-CM | POA: Diagnosis not present

## 2015-03-28 DIAGNOSIS — E1129 Type 2 diabetes mellitus with other diabetic kidney complication: Secondary | ICD-10-CM | POA: Diagnosis not present

## 2015-03-28 DIAGNOSIS — E876 Hypokalemia: Secondary | ICD-10-CM | POA: Diagnosis not present

## 2015-03-28 DIAGNOSIS — N2581 Secondary hyperparathyroidism of renal origin: Secondary | ICD-10-CM | POA: Diagnosis not present

## 2015-03-30 DIAGNOSIS — E876 Hypokalemia: Secondary | ICD-10-CM | POA: Diagnosis not present

## 2015-03-30 DIAGNOSIS — E1129 Type 2 diabetes mellitus with other diabetic kidney complication: Secondary | ICD-10-CM | POA: Diagnosis not present

## 2015-03-30 DIAGNOSIS — N186 End stage renal disease: Secondary | ICD-10-CM | POA: Diagnosis not present

## 2015-03-30 DIAGNOSIS — D631 Anemia in chronic kidney disease: Secondary | ICD-10-CM | POA: Diagnosis not present

## 2015-03-30 DIAGNOSIS — D509 Iron deficiency anemia, unspecified: Secondary | ICD-10-CM | POA: Diagnosis not present

## 2015-03-30 DIAGNOSIS — N2581 Secondary hyperparathyroidism of renal origin: Secondary | ICD-10-CM | POA: Diagnosis not present

## 2015-04-01 DIAGNOSIS — E1129 Type 2 diabetes mellitus with other diabetic kidney complication: Secondary | ICD-10-CM | POA: Diagnosis not present

## 2015-04-01 DIAGNOSIS — D631 Anemia in chronic kidney disease: Secondary | ICD-10-CM | POA: Diagnosis not present

## 2015-04-01 DIAGNOSIS — E876 Hypokalemia: Secondary | ICD-10-CM | POA: Diagnosis not present

## 2015-04-01 DIAGNOSIS — N2581 Secondary hyperparathyroidism of renal origin: Secondary | ICD-10-CM | POA: Diagnosis not present

## 2015-04-01 DIAGNOSIS — N186 End stage renal disease: Secondary | ICD-10-CM | POA: Diagnosis not present

## 2015-04-01 DIAGNOSIS — D509 Iron deficiency anemia, unspecified: Secondary | ICD-10-CM | POA: Diagnosis not present

## 2015-04-02 ENCOUNTER — Ambulatory Visit (HOSPITAL_BASED_OUTPATIENT_CLINIC_OR_DEPARTMENT_OTHER)
Admission: RE | Admit: 2015-04-02 | Discharge: 2015-04-02 | Disposition: A | Discharge status: Home / Self Care | Payer: Medicare Other | Source: Ambulatory Visit | Attending: Family Medicine | Admitting: Family Medicine

## 2015-04-02 DIAGNOSIS — M545 Low back pain, unspecified: Secondary | ICD-10-CM

## 2015-04-02 DIAGNOSIS — M4806 Spinal stenosis, lumbar region: Secondary | ICD-10-CM | POA: Diagnosis not present

## 2015-04-02 DIAGNOSIS — M47896 Other spondylosis, lumbar region: Secondary | ICD-10-CM | POA: Insufficient documentation

## 2015-04-02 DIAGNOSIS — M5136 Other intervertebral disc degeneration, lumbar region: Secondary | ICD-10-CM | POA: Diagnosis not present

## 2015-04-04 DIAGNOSIS — E1140 Type 2 diabetes mellitus with diabetic neuropathy, unspecified: Secondary | ICD-10-CM | POA: Diagnosis not present

## 2015-04-04 DIAGNOSIS — E1129 Type 2 diabetes mellitus with other diabetic kidney complication: Secondary | ICD-10-CM | POA: Diagnosis not present

## 2015-04-04 DIAGNOSIS — E1122 Type 2 diabetes mellitus with diabetic chronic kidney disease: Secondary | ICD-10-CM | POA: Diagnosis not present

## 2015-04-04 DIAGNOSIS — G4730 Sleep apnea, unspecified: Secondary | ICD-10-CM | POA: Diagnosis not present

## 2015-04-04 DIAGNOSIS — N186 End stage renal disease: Secondary | ICD-10-CM | POA: Diagnosis not present

## 2015-04-04 DIAGNOSIS — I509 Heart failure, unspecified: Secondary | ICD-10-CM | POA: Diagnosis not present

## 2015-04-04 DIAGNOSIS — I132 Hypertensive heart and chronic kidney disease with heart failure and with stage 5 chronic kidney disease, or end stage renal disease: Secondary | ICD-10-CM | POA: Diagnosis not present

## 2015-04-04 DIAGNOSIS — L97812 Non-pressure chronic ulcer of other part of right lower leg with fat layer exposed: Secondary | ICD-10-CM | POA: Diagnosis not present

## 2015-04-04 DIAGNOSIS — E876 Hypokalemia: Secondary | ICD-10-CM | POA: Diagnosis not present

## 2015-04-04 DIAGNOSIS — E11622 Type 2 diabetes mellitus with other skin ulcer: Secondary | ICD-10-CM | POA: Diagnosis not present

## 2015-04-04 DIAGNOSIS — M1990 Unspecified osteoarthritis, unspecified site: Secondary | ICD-10-CM | POA: Diagnosis not present

## 2015-04-04 DIAGNOSIS — D631 Anemia in chronic kidney disease: Secondary | ICD-10-CM | POA: Diagnosis not present

## 2015-04-04 DIAGNOSIS — H409 Unspecified glaucoma: Secondary | ICD-10-CM | POA: Diagnosis not present

## 2015-04-04 DIAGNOSIS — I252 Old myocardial infarction: Secondary | ICD-10-CM | POA: Diagnosis not present

## 2015-04-04 DIAGNOSIS — I87311 Chronic venous hypertension (idiopathic) with ulcer of right lower extremity: Secondary | ICD-10-CM | POA: Diagnosis not present

## 2015-04-04 DIAGNOSIS — N2581 Secondary hyperparathyroidism of renal origin: Secondary | ICD-10-CM | POA: Diagnosis not present

## 2015-04-04 DIAGNOSIS — L97212 Non-pressure chronic ulcer of right calf with fat layer exposed: Secondary | ICD-10-CM | POA: Diagnosis not present

## 2015-04-04 DIAGNOSIS — Z992 Dependence on renal dialysis: Secondary | ICD-10-CM | POA: Diagnosis not present

## 2015-04-04 DIAGNOSIS — D509 Iron deficiency anemia, unspecified: Secondary | ICD-10-CM | POA: Diagnosis not present

## 2015-04-05 ENCOUNTER — Other Ambulatory Visit: Payer: Self-pay | Admitting: Family Medicine

## 2015-04-05 DIAGNOSIS — G8929 Other chronic pain: Secondary | ICD-10-CM

## 2015-04-05 DIAGNOSIS — M545 Low back pain: Principal | ICD-10-CM

## 2015-04-06 DIAGNOSIS — I4891 Unspecified atrial fibrillation: Secondary | ICD-10-CM | POA: Diagnosis not present

## 2015-04-06 DIAGNOSIS — E876 Hypokalemia: Secondary | ICD-10-CM | POA: Diagnosis not present

## 2015-04-06 DIAGNOSIS — D509 Iron deficiency anemia, unspecified: Secondary | ICD-10-CM | POA: Diagnosis not present

## 2015-04-06 DIAGNOSIS — N186 End stage renal disease: Secondary | ICD-10-CM | POA: Diagnosis not present

## 2015-04-06 DIAGNOSIS — N2581 Secondary hyperparathyroidism of renal origin: Secondary | ICD-10-CM | POA: Diagnosis not present

## 2015-04-06 DIAGNOSIS — D631 Anemia in chronic kidney disease: Secondary | ICD-10-CM | POA: Diagnosis not present

## 2015-04-06 DIAGNOSIS — E1129 Type 2 diabetes mellitus with other diabetic kidney complication: Secondary | ICD-10-CM | POA: Diagnosis not present

## 2015-04-07 ENCOUNTER — Ambulatory Visit (INDEPENDENT_AMBULATORY_CARE_PROVIDER_SITE_OTHER): Payer: Medicare Other | Admitting: Family Medicine

## 2015-04-07 ENCOUNTER — Encounter: Payer: Self-pay | Admitting: Family Medicine

## 2015-04-07 VITALS — BP 115/66 | HR 80 | Ht 69.0 in | Wt 247.0 lb

## 2015-04-07 DIAGNOSIS — M1710 Unilateral primary osteoarthritis, unspecified knee: Secondary | ICD-10-CM

## 2015-04-07 DIAGNOSIS — M170 Bilateral primary osteoarthritis of knee: Secondary | ICD-10-CM | POA: Diagnosis not present

## 2015-04-07 MED ORDER — TRAMADOL HCL 50 MG PO TABS: 50.0000 mg | ORAL_TABLET | Freq: Four times a day (QID) | ORAL | Status: DC | PRN

## 2015-04-07 MED ORDER — SODIUM HYALURONATE (VISCOSUP) 25 MG/2.5ML IX SOSY
2.5000 mL | PREFILLED_SYRINGE | Freq: Once | INTRA_ARTICULAR | Status: AC
  Administered 2015-04-07: 2.5 mL via INTRA_ARTICULAR

## 2015-04-08 DIAGNOSIS — N2581 Secondary hyperparathyroidism of renal origin: Secondary | ICD-10-CM | POA: Diagnosis not present

## 2015-04-08 DIAGNOSIS — D631 Anemia in chronic kidney disease: Secondary | ICD-10-CM | POA: Diagnosis not present

## 2015-04-08 DIAGNOSIS — D509 Iron deficiency anemia, unspecified: Secondary | ICD-10-CM | POA: Diagnosis not present

## 2015-04-08 DIAGNOSIS — E876 Hypokalemia: Secondary | ICD-10-CM | POA: Diagnosis not present

## 2015-04-08 DIAGNOSIS — N186 End stage renal disease: Secondary | ICD-10-CM | POA: Diagnosis not present

## 2015-04-08 DIAGNOSIS — E1129 Type 2 diabetes mellitus with other diabetic kidney complication: Secondary | ICD-10-CM | POA: Diagnosis not present

## 2015-04-11 DIAGNOSIS — N186 End stage renal disease: Secondary | ICD-10-CM | POA: Diagnosis not present

## 2015-04-11 DIAGNOSIS — N2581 Secondary hyperparathyroidism of renal origin: Secondary | ICD-10-CM | POA: Diagnosis not present

## 2015-04-11 DIAGNOSIS — D509 Iron deficiency anemia, unspecified: Secondary | ICD-10-CM | POA: Diagnosis not present

## 2015-04-11 DIAGNOSIS — E876 Hypokalemia: Secondary | ICD-10-CM | POA: Diagnosis not present

## 2015-04-11 DIAGNOSIS — D631 Anemia in chronic kidney disease: Secondary | ICD-10-CM | POA: Diagnosis not present

## 2015-04-11 DIAGNOSIS — E1129 Type 2 diabetes mellitus with other diabetic kidney complication: Secondary | ICD-10-CM | POA: Diagnosis not present

## 2015-04-11 NOTE — Assessment & Plan Note (Signed)
2/2 severe DJD confirmed with left knee radiographs showing tricompartmental severe DJD in June.  He is a poor surgical candidate.  Cortisone injections with minimal benefit to date.  Starting supartz series today.  After informed written consent, patient was lying supine on exam table. Right knee was prepped with alcohol swab and utilizing superolateral approach with ultrasound guidance, 29mL marcaine used for local anesthesia, patient's right knee was aspirated (64mL clear yellow fluid aspirated) then right knee injected intraarticularly with 3:1 marcaine: depomedrol. Patient tolerated the procedure well without immediate complications.  After informed written consent, patient was lying supine on exam table. Left knee was prepped with alcohol swab and utilizing superolateral approach with ultrasound guidance, 60mL marcaine used for local anesthesia, patient's left knee was aspirated (65mL clear yellow fluid aspirated) then left knee injected intraarticularly with 3:1 marcaine: depomedrol. Patient tolerated the procedure well without immediate complications.

## 2015-04-11 NOTE — Progress Notes (Signed)
PCP: Cathlean Cower, MD  Subjective:   HPI: Patient is a 60 y.o. male here for right shoulder, low back pain.  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 Previously seen by Dr. Tamala Julian but was dismissed from [facility] for missing appointments. Last cortisone injections over 3 months ago. Has done viscosupplementation as well with some relief. Has had over 10 years of grinding R > L knee pain. Pain level 10/10 on right, 6/10 on left, sharp. Worse with standing, prolonged sitting and going to get up. No skin changes, fever, other complaints.  12/21: Patient has had problems with right shoulder dating back at least to early March. He sustained a fall at that time with severe pain in right shoulder and arm. Radiographs had a small joint effusion, chronic Hill-Sachs deformity. After this fall he could no longer lift right arm and has continued to have this problem. Pain level 7/10, sharp lateral shoulder. MSK u/s by Dr. Tamala Julian showed a full thickness supraspinatus tear that was retracted, subscapularis tear. He has also had low back pain central mid-low back at 7/10 level, sharp as well. Had compression fracture diagnosed by ED in June. Some radiation into right hip area. Used calcitonin initially. No bowel/bladder dysfunction. No numbness/tingling.  03/21/15: Patient returns with continued pain, swelling in both knees to 7/10 level. Injections only helped temporarily. Low back still hurts as well centrally. No radiation into extremities - pain also 7/10 and sharp. No bowel/bladder dysfunction. No skin changes, fever. No numbness/tingling.  2/23: Patient returns to start bilateral supartz series. Knee pain 9/10 right, 8/10 left.  Past Medical History  Diagnosis Date  . Glaucoma   . Hypertension   . Hyperlipidemia   . Atrial fibrillation ([place]) 12/30/2012  . CHF (congestive heart failure) ([place]) 12/30/2012  . Diabetic retinopathy ([place]) 12/30/2012  . Morbid obesity ([place])  12/30/2012  . Pulmonary hypertension ([place]) 12/30/2012  . Myocardial infarction ([place])     "I've had ~ 3; last one was in ~ 10/2013" (01/30/2014)  . Diabetes mellitus with neuropathy ([place])   . OSA (obstructive sleep apnea) 12/30/2012    "lost weight; no longer needed CPAP; retested said I needed it; didn't followup cause I was feeling fine" (01/30/2014)  . GERD (gastroesophageal reflux disease)   . Stroke [facility]) ~ 2005; ~ 2005    "they were mild; I didn't even notice I'd had them"; denies residual on 01/30/2014  . History of gout     "right big toe"  . Arthritis     "all over"  . Chronic lower back pain   . ESRD (end stage renal disease) on dialysis ([place]) started in 2013    MWF; [facility]; [employer]  . Refusal of blood transfusions as patient is Jehovah's Witness   . Anemia due to other cause 07/07/2014  . Allergic rhinitis 07/07/2014  . Hypothyroid     Current Outpatient Prescriptions on File Prior to Visit  Medication Sig Dispense Refill  . acetaminophen (TYLENOL) 650 MG CR tablet Take 650 mg by mouth every 8 (eight) hours as needed for pain.    Marland Kitchen allopurinol (ZYLOPRIM) 100 MG tablet Take 1 tablet (100 mg total) by mouth 2 (two) times daily. 60 tablet 1  . allopurinol (ZYLOPRIM) 100 MG tablet TAKE ONE TABLET BY MOUTH TWICE DAILY 180 tablet 1  . amiodarone (PACERONE) 200 MG tablet Take 1 tablet (200 mg total) by mouth daily. 90 tablet 1  . amitriptyline (ELAVIL) 75 MG tablet Take 1 tablet (75 mg  total) by mouth at bedtime. 90 tablet 0  . Blood Glucose Monitoring Suppl (ONE TOUCH ULTRA 2) W/DEVICE KIT Use to check blood sugars twice a day Dx E11.9 1 each 0  . carvedilol (COREG) 25 MG tablet Take 25 mg by mouth 2 (two) times daily with a meal. TAKE 1/2 TABLET THE NIGHT BEFORE AND MORNING OF DIALYSIS    . collagenase (SANTYL) ointment Apply topically daily. (Patient not taking: Reported on 02/03/2015) 15 g 1  . diazepam (VALIUM) 5 MG tablet Take 1 tablet 30 minutes prior to procedure.   May repeat x 1 if necessary. 2 tablet 0  . glucose blood (ONE TOUCH ULTRA TEST) test strip 1 each by Other route 2 (two) times daily. Use to check blood sugars twice a day Dx E11.9 100 each 3  . JANUVIA 25 MG tablet TAKE ONE TABLET BY MOUTH ONCE DAILY 90 tablet 0  . Lancets (ONETOUCH ULTRASOFT) lancets 1 each by Other route 2 (two) times daily. Use to help check blood sugars twice a day Dx E11.9 100 each 3  . loperamide (IMODIUM A-D) 2 MG tablet Take 2 mg by mouth 4 (four) times daily as needed for diarrhea or loose stools.    . ondansetron (ZOFRAN) 4 MG tablet Take 1 tablet (4 mg total) by mouth every 6 (six) hours. (Patient not taking: Reported on 02/03/2015) 30 tablet 0  . ranitidine (ZANTAC) 150 MG tablet Take 150 mg by mouth daily as needed for heartburn. Reported on 02/03/2015    . sevelamer carbonate (RENVELA) 800 MG tablet Take 1,600 mg by mouth 3 (three) times daily with meals.     . simvastatin (ZOCOR) 20 MG tablet TAKE ONE TABLET BY MOUTH ONCE DAILY 90 tablet 0  . sodium chloride (OCEAN) 0.65 % SOLN nasal spray Place 1 spray into both nostrils as needed for congestion. 104 mL 0  . warfarin (COUMADIN) 3 MG tablet Please take as directed by anticoagulation clinic. (Patient taking differently: Take 3-4.5 mg by mouth daily at 6 PM. Takes 4.60m on thu only Takes 380mall other days) 90 tablet 0   No current facility-administered medications on file prior to visit.    Past Surgical History  Procedure Laterality Date  . Total hip arthroplasty Right 1980  . Av fistula placement Left ~ 10/2013    "forearm"  . Closed reduction hip dislocation Right 1970's  . Cardiac catheterization    . Cardiac catheterization N/A 01/07/2015    Procedure: Left Heart Cath and Coronary Angiography;  Surgeon: ThTroy SineMD;  [facility];  Service: Cardiovascular;  Laterality: N/A;    No Known Allergies  Social History   Social History  . Marital Status: Divorced    Spouse Name:  N/A  . Number of Children: 2  . Years of Education: N/A   Occupational History  . Not on file.   Social History Main Topics  . Smoking status: Never Smoker   . Smokeless tobacco: Never Used  . Alcohol Use: 1.8 - 2.4 oz/week    3-4 Standard drinks or equivalent per week     Comment: 01/30/2014 "none in the last 3 weeks; primarily drink w/my friends on the weekends"  . Drug Use: No  . Sexual Activity: Not Currently   Other Topics Concern  . Not on file   Social History Narrative    Family History  Problem Relation Age of Onset  . Arthritis Other   . Heart disease Other   .  Hyperlipidemia Other   . Stroke Other   . Hypertension Other   . Kidney disease Other   . Diabetes Other     BP 115/66 mmHg  Pulse 80  Ht '5\' 9"'  (1.753 m)  Wt 247 lb (112.038 kg)  BMI 36.46 kg/m2  Review of Systems: See HPI above.    Objective:  Physical Exam:  Gen: NAD, comfortable in exam room.  Knee exam not repeated today. Right knee: Mod effusion.  No bruising, other deformity. Mild medial > lateral joint line tenderness. ROM 0 - 100 degrees. Negative ant/post drawers. Negative valgus/varus testing. Negative lachmanns. Negative mcmurrays, apleys, patellar apprehension. NV intact distally.  Left knee: Mod effusion.  No bruising, other deformity. Mild medial > lateral joint line tenderness. ROM 0 - 100 degrees. Negative ant/post drawers. Negative valgus/varus testing. Negative lachmanns. Negative mcmurrays, apleys, patellar apprehension. NV intact distally.    Assessment & Plan:  1. Bilateral knee pain - 2/2 severe DJD confirmed with left knee radiographs showing tricompartmental severe DJD in June.  He is a poor surgical candidate.  Cortisone injections with minimal benefit to date.  Starting supartz series today.  After informed written consent, patient was lying supine on exam table. Right knee was prepped with alcohol swab and utilizing superolateral approach with ultrasound  guidance, 66m marcaine used for local anesthesia, patient's right knee was aspirated (6452mclear yellow fluid aspirated) then right knee injected intraarticularly with 3:1 marcaine: depomedrol. Patient tolerated the procedure well without immediate complications.  After informed written consent, patient was lying supine on exam table. Left knee was prepped with alcohol swab and utilizing superolateral approach with ultrasound guidance, 52m30marcaine used for local anesthesia, patient's left knee was aspirated (68m55mear yellow fluid aspirated) then left knee injected intraarticularly with 3:1 marcaine: depomedrol. Patient tolerated the procedure well without immediate complications.

## 2015-04-12 ENCOUNTER — Ambulatory Visit: Payer: Medicare Other

## 2015-04-12 DIAGNOSIS — Z992 Dependence on renal dialysis: Secondary | ICD-10-CM | POA: Diagnosis not present

## 2015-04-12 DIAGNOSIS — I120 Hypertensive chronic kidney disease with stage 5 chronic kidney disease or end stage renal disease: Secondary | ICD-10-CM | POA: Diagnosis not present

## 2015-04-12 DIAGNOSIS — N186 End stage renal disease: Secondary | ICD-10-CM | POA: Diagnosis not present

## 2015-04-13 DIAGNOSIS — D509 Iron deficiency anemia, unspecified: Secondary | ICD-10-CM | POA: Diagnosis not present

## 2015-04-13 DIAGNOSIS — E876 Hypokalemia: Secondary | ICD-10-CM | POA: Diagnosis not present

## 2015-04-13 DIAGNOSIS — E1129 Type 2 diabetes mellitus with other diabetic kidney complication: Secondary | ICD-10-CM | POA: Diagnosis not present

## 2015-04-13 DIAGNOSIS — N2581 Secondary hyperparathyroidism of renal origin: Secondary | ICD-10-CM | POA: Diagnosis not present

## 2015-04-13 DIAGNOSIS — D631 Anemia in chronic kidney disease: Secondary | ICD-10-CM | POA: Diagnosis not present

## 2015-04-13 DIAGNOSIS — N186 End stage renal disease: Secondary | ICD-10-CM | POA: Diagnosis not present

## 2015-04-14 ENCOUNTER — Ambulatory Visit: Payer: Medicare Other | Admitting: Family Medicine

## 2015-04-14 ENCOUNTER — Ambulatory Visit: Payer: Self-pay | Admitting: General Practice

## 2015-04-16 DIAGNOSIS — D509 Iron deficiency anemia, unspecified: Secondary | ICD-10-CM | POA: Diagnosis not present

## 2015-04-16 DIAGNOSIS — N186 End stage renal disease: Secondary | ICD-10-CM | POA: Diagnosis not present

## 2015-04-16 DIAGNOSIS — N2581 Secondary hyperparathyroidism of renal origin: Secondary | ICD-10-CM | POA: Diagnosis not present

## 2015-04-16 DIAGNOSIS — D631 Anemia in chronic kidney disease: Secondary | ICD-10-CM | POA: Diagnosis not present

## 2015-04-16 DIAGNOSIS — E1129 Type 2 diabetes mellitus with other diabetic kidney complication: Secondary | ICD-10-CM | POA: Diagnosis not present

## 2015-04-16 DIAGNOSIS — E876 Hypokalemia: Secondary | ICD-10-CM | POA: Diagnosis not present

## 2015-04-18 DIAGNOSIS — D631 Anemia in chronic kidney disease: Secondary | ICD-10-CM | POA: Diagnosis not present

## 2015-04-18 DIAGNOSIS — E876 Hypokalemia: Secondary | ICD-10-CM | POA: Diagnosis not present

## 2015-04-18 DIAGNOSIS — N186 End stage renal disease: Secondary | ICD-10-CM | POA: Diagnosis not present

## 2015-04-18 DIAGNOSIS — E1129 Type 2 diabetes mellitus with other diabetic kidney complication: Secondary | ICD-10-CM | POA: Diagnosis not present

## 2015-04-18 DIAGNOSIS — N2581 Secondary hyperparathyroidism of renal origin: Secondary | ICD-10-CM | POA: Diagnosis not present

## 2015-04-18 DIAGNOSIS — D509 Iron deficiency anemia, unspecified: Secondary | ICD-10-CM | POA: Diagnosis not present

## 2015-04-20 DIAGNOSIS — N2581 Secondary hyperparathyroidism of renal origin: Secondary | ICD-10-CM | POA: Diagnosis not present

## 2015-04-20 DIAGNOSIS — D509 Iron deficiency anemia, unspecified: Secondary | ICD-10-CM | POA: Diagnosis not present

## 2015-04-20 DIAGNOSIS — D631 Anemia in chronic kidney disease: Secondary | ICD-10-CM | POA: Diagnosis not present

## 2015-04-20 DIAGNOSIS — N186 End stage renal disease: Secondary | ICD-10-CM | POA: Diagnosis not present

## 2015-04-20 DIAGNOSIS — E876 Hypokalemia: Secondary | ICD-10-CM | POA: Diagnosis not present

## 2015-04-20 DIAGNOSIS — E1129 Type 2 diabetes mellitus with other diabetic kidney complication: Secondary | ICD-10-CM | POA: Diagnosis not present

## 2015-04-21 ENCOUNTER — Encounter: Payer: Self-pay | Admitting: Family Medicine

## 2015-04-21 ENCOUNTER — Ambulatory Visit (INDEPENDENT_AMBULATORY_CARE_PROVIDER_SITE_OTHER): Payer: Medicare Other | Admitting: Family Medicine

## 2015-04-21 VITALS — BP 121/80 | HR 93 | Ht 69.0 in | Wt 240.0 lb

## 2015-04-21 DIAGNOSIS — M1710 Unilateral primary osteoarthritis, unspecified knee: Secondary | ICD-10-CM | POA: Diagnosis not present

## 2015-04-21 DIAGNOSIS — M25562 Pain in left knee: Secondary | ICD-10-CM

## 2015-04-21 DIAGNOSIS — M25561 Pain in right knee: Secondary | ICD-10-CM

## 2015-04-21 MED ORDER — SODIUM HYALURONATE (VISCOSUP) 25 MG/2.5ML IX SOSY
2.5000 mL | PREFILLED_SYRINGE | Freq: Once | INTRA_ARTICULAR | Status: AC
  Administered 2015-04-21: 2.5 mL via INTRA_ARTICULAR

## 2015-04-22 DIAGNOSIS — E1129 Type 2 diabetes mellitus with other diabetic kidney complication: Secondary | ICD-10-CM | POA: Diagnosis not present

## 2015-04-22 DIAGNOSIS — N186 End stage renal disease: Secondary | ICD-10-CM | POA: Diagnosis not present

## 2015-04-22 DIAGNOSIS — E876 Hypokalemia: Secondary | ICD-10-CM | POA: Diagnosis not present

## 2015-04-22 DIAGNOSIS — D509 Iron deficiency anemia, unspecified: Secondary | ICD-10-CM | POA: Diagnosis not present

## 2015-04-22 DIAGNOSIS — D631 Anemia in chronic kidney disease: Secondary | ICD-10-CM | POA: Diagnosis not present

## 2015-04-22 DIAGNOSIS — N2581 Secondary hyperparathyroidism of renal origin: Secondary | ICD-10-CM | POA: Diagnosis not present

## 2015-04-25 ENCOUNTER — Encounter (HOSPITAL_BASED_OUTPATIENT_CLINIC_OR_DEPARTMENT_OTHER): Payer: Medicare Other | Attending: Internal Medicine

## 2015-04-25 DIAGNOSIS — Z992 Dependence on renal dialysis: Secondary | ICD-10-CM | POA: Insufficient documentation

## 2015-04-25 DIAGNOSIS — N186 End stage renal disease: Secondary | ICD-10-CM | POA: Insufficient documentation

## 2015-04-25 DIAGNOSIS — E1129 Type 2 diabetes mellitus with other diabetic kidney complication: Secondary | ICD-10-CM | POA: Diagnosis not present

## 2015-04-25 DIAGNOSIS — I132 Hypertensive heart and chronic kidney disease with heart failure and with stage 5 chronic kidney disease, or end stage renal disease: Secondary | ICD-10-CM | POA: Diagnosis not present

## 2015-04-25 DIAGNOSIS — E1122 Type 2 diabetes mellitus with diabetic chronic kidney disease: Secondary | ICD-10-CM | POA: Insufficient documentation

## 2015-04-25 DIAGNOSIS — E1140 Type 2 diabetes mellitus with diabetic neuropathy, unspecified: Secondary | ICD-10-CM | POA: Insufficient documentation

## 2015-04-25 DIAGNOSIS — I252 Old myocardial infarction: Secondary | ICD-10-CM | POA: Diagnosis not present

## 2015-04-25 DIAGNOSIS — I4891 Unspecified atrial fibrillation: Secondary | ICD-10-CM | POA: Insufficient documentation

## 2015-04-25 DIAGNOSIS — G4730 Sleep apnea, unspecified: Secondary | ICD-10-CM | POA: Diagnosis not present

## 2015-04-25 DIAGNOSIS — E11622 Type 2 diabetes mellitus with other skin ulcer: Secondary | ICD-10-CM | POA: Insufficient documentation

## 2015-04-25 DIAGNOSIS — D631 Anemia in chronic kidney disease: Secondary | ICD-10-CM | POA: Diagnosis not present

## 2015-04-25 DIAGNOSIS — D509 Iron deficiency anemia, unspecified: Secondary | ICD-10-CM | POA: Diagnosis not present

## 2015-04-25 DIAGNOSIS — M1990 Unspecified osteoarthritis, unspecified site: Secondary | ICD-10-CM | POA: Insufficient documentation

## 2015-04-25 DIAGNOSIS — I509 Heart failure, unspecified: Secondary | ICD-10-CM | POA: Insufficient documentation

## 2015-04-25 DIAGNOSIS — N2581 Secondary hyperparathyroidism of renal origin: Secondary | ICD-10-CM | POA: Diagnosis not present

## 2015-04-25 DIAGNOSIS — L97812 Non-pressure chronic ulcer of other part of right lower leg with fat layer exposed: Secondary | ICD-10-CM | POA: Diagnosis not present

## 2015-04-25 DIAGNOSIS — I87311 Chronic venous hypertension (idiopathic) with ulcer of right lower extremity: Secondary | ICD-10-CM | POA: Diagnosis not present

## 2015-04-25 DIAGNOSIS — L97211 Non-pressure chronic ulcer of right calf limited to breakdown of skin: Secondary | ICD-10-CM | POA: Insufficient documentation

## 2015-04-25 DIAGNOSIS — Z7901 Long term (current) use of anticoagulants: Secondary | ICD-10-CM | POA: Insufficient documentation

## 2015-04-25 DIAGNOSIS — E876 Hypokalemia: Secondary | ICD-10-CM | POA: Diagnosis not present

## 2015-04-25 NOTE — Assessment & Plan Note (Signed)
2/2 severe DJD confirmed with left knee radiographs showing tricompartmental severe DJD in June.  He is a poor surgical candidate.  Cortisone injections with minimal benefit to date.  Second supartz injection given today.  After informed written consent, patient was lying supine on exam table. Right knee was prepped with alcohol swab and utilizing superolateral approach with ultrasound guidance, patient's right knee was injected intraarticularly with 72mL marcaine followed by Jacklyn Shell.  Patient tolerated the procedure well without immediate complications.  After informed written consent, patient was lying supine on exam table. Left knee was prepped with alcohol swab and utilizing superolateral approach with ultrasound guidance, patient's left knee was injected intraarticularly with 97mL marcaine followed by Jacklyn Shell.  Patient tolerated the procedure well without immediate complications.

## 2015-04-25 NOTE — Progress Notes (Signed)
PCP: Cathlean Cower, MD  Subjective:   HPI: Patient is a 59 y.o. male here for right shoulder, low back pain.  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 Previously seen by Dr. Tamala Julian but was dismissed from [address] for missing appointments. Last cortisone injections over 3 months ago. Has done viscosupplementation as well with some relief. Has had over 10 years of grinding R > L knee pain. Pain level 10/10 on right, 6/10 on left, sharp. Worse with standing, prolonged sitting and going to get up. No skin changes, fever, other complaints.  12/21: Patient has had problems with right shoulder dating back at least to early March. He sustained a fall at that time with severe pain in right shoulder and arm. Radiographs had a small joint effusion, chronic Hill-Sachs deformity. After this fall he could no longer lift right arm and has continued to have this problem. Pain level 7/10, sharp lateral shoulder. MSK u/s by Dr. Tamala Julian showed a full thickness supraspinatus tear that was retracted, subscapularis tear. He has also had low back pain central mid-low back at 7/10 level, sharp as well. Had compression fracture diagnosed by ED in June. Some radiation into right hip area. Used calcitonin initially. No bowel/bladder dysfunction. No numbness/tingling.  03/21/15: Patient returns with continued pain, swelling in both knees to 7/10 level. Injections only helped temporarily. Low back still hurts as well centrally. No radiation into extremities - pain also 7/10 and sharp. No bowel/bladder dysfunction. No skin changes, fever. No numbness/tingling.  2/23: Patient returns to start bilateral supartz series. Knee pain 9/10 right, 8/10 left.  3/9: Patient reports knee pain down to 0/10 bilaterally. Still with swelling. No current complaints.  Past Medical History  Diagnosis Date  . Glaucoma   . Hypertension   . Hyperlipidemia   . Atrial fibrillation ([place]) 12/30/2012  . CHF  (congestive heart failure) ([place]) 12/30/2012  . Diabetic retinopathy ([place]) 12/30/2012  . Morbid obesity ([place]) 12/30/2012  . Pulmonary hypertension ([place]) 12/30/2012  . Myocardial infarction ([place])     "I've had ~ 3; last one was in ~ 10/2013" (01/30/2014)  . Diabetes mellitus with neuropathy ([place])   . OSA (obstructive sleep apnea) 12/30/2012    "lost weight; no longer needed CPAP; retested said I needed it; didn't followup cause I was feeling fine" (01/30/2014)  . GERD (gastroesophageal reflux disease)   . Stroke [facility]) ~ 2005; ~ 2005    "they were mild; I didn't even notice I'd had them"; denies residual on 01/30/2014  . History of gout     "right big toe"  . Arthritis     "all over"  . Chronic lower back pain   . ESRD (end stage renal disease) on dialysis ([place]) started in 2013    MWF; [facility]; [employer]  . Refusal of blood transfusions as patient is Jehovah's Witness   . Anemia due to other cause 07/07/2014  . Allergic rhinitis 07/07/2014  . Hypothyroid     Current Outpatient Prescriptions on File Prior to Visit  Medication Sig Dispense Refill  . acetaminophen (TYLENOL) 650 MG CR tablet Take 650 mg by mouth every 8 (eight) hours as needed for pain.    Marland Kitchen allopurinol (ZYLOPRIM) 100 MG tablet Take 1 tablet (100 mg total) by mouth 2 (two) times daily. 60 tablet 1  . allopurinol (ZYLOPRIM) 100 MG tablet TAKE ONE TABLET BY MOUTH TWICE DAILY 180 tablet 1  . amiodarone (PACERONE) 200 MG tablet Take 1 tablet (200 mg total) by mouth  daily. 90 tablet 1  . amitriptyline (ELAVIL) 75 MG tablet Take 1 tablet (75 mg total) by mouth at bedtime. 90 tablet 0  . Blood Glucose Monitoring Suppl (ONE TOUCH ULTRA 2) W/DEVICE KIT Use to check blood sugars twice a day Dx E11.9 1 each 0  . carvedilol (COREG) 25 MG tablet Take 25 mg by mouth 2 (two) times daily with a meal. TAKE 1/2 TABLET THE NIGHT BEFORE AND MORNING OF DIALYSIS    . collagenase (SANTYL) ointment Apply topically daily. (Patient not taking:  Reported on 02/03/2015) 15 g 1  . diazepam (VALIUM) 5 MG tablet Take 1 tablet 30 minutes prior to procedure.  May repeat x 1 if necessary. 2 tablet 0  . glucose blood (ONE TOUCH ULTRA TEST) test strip 1 each by Other route 2 (two) times daily. Use to check blood sugars twice a day Dx E11.9 100 each 3  . JANUVIA 25 MG tablet TAKE ONE TABLET BY MOUTH ONCE DAILY 90 tablet 0  . Lancets (ONETOUCH ULTRASOFT) lancets 1 each by Other route 2 (two) times daily. Use to help check blood sugars twice a day Dx E11.9 100 each 3  . loperamide (IMODIUM A-D) 2 MG tablet Take 2 mg by mouth 4 (four) times daily as needed for diarrhea or loose stools.    . ondansetron (ZOFRAN) 4 MG tablet Take 1 tablet (4 mg total) by mouth every 6 (six) hours. (Patient not taking: Reported on 02/03/2015) 30 tablet 0  . ranitidine (ZANTAC) 150 MG tablet Take 150 mg by mouth daily as needed for heartburn. Reported on 02/03/2015    . sevelamer carbonate (RENVELA) 800 MG tablet Take 1,600 mg by mouth 3 (three) times daily with meals.     . simvastatin (ZOCOR) 20 MG tablet TAKE ONE TABLET BY MOUTH ONCE DAILY 90 tablet 0  . sodium chloride (OCEAN) 0.65 % SOLN nasal spray Place 1 spray into both nostrils as needed for congestion. 104 mL 0  . traMADol (ULTRAM) 50 MG tablet Take 1 tablet (50 mg total) by mouth every 6 (six) hours as needed. 60 tablet 0  . warfarin (COUMADIN) 3 MG tablet Please take as directed by anticoagulation clinic. (Patient taking differently: Take 3-4.5 mg by mouth daily at 6 PM. Takes 4.70m on thu only Takes 360mall other days) 90 tablet 0   No current facility-administered medications on file prior to visit.    Past Surgical History  Procedure Laterality Date  . Total hip arthroplasty Right 1980  . Av fistula placement Left ~ 10/2013    "forearm"  . Closed reduction hip dislocation Right 1970's  . Cardiac catheterization    . Cardiac catheterization N/A 01/07/2015    Procedure: Left Heart Cath and Coronary  Angiography;  Surgeon: ThTroy SineMD;  Location: [facility];  Service: Cardiovascular;  Laterality: N/A;    No Known Allergies  Social History   Social History  . Marital Status: Divorced    Spouse Name: N/A  . Number of Children: 2  . Years of Education: N/A   Occupational History  . Not on file.   Social History Main Topics  . Smoking status: Never Smoker   . Smokeless tobacco: Never Used  . Alcohol Use: 1.8 - 2.4 oz/week    3-4 Standard drinks or equivalent per week     Comment: 01/30/2014 "none in the last 3 weeks; primarily drink w/my friends on the weekends"  . Drug Use: No  . Sexual Activity:  Not Currently   Other Topics Concern  . Not on file   Social History Narrative    Family History  Problem Relation Age of Onset  . Arthritis Other   . Heart disease Other   . Hyperlipidemia Other   . Stroke Other   . Hypertension Other   . Kidney disease Other   . Diabetes Other     BP 121/80 mmHg  Pulse 93  Ht _0  (1.753 m)  Wt 240 lb (108.863 kg)  BMI 35.43 kg/m2  Review of Systems: See HPI above.    Objective:  Physical Exam:  Gen: NAD, comfortable in exam room.  Knee exam not repeated today. Right knee: Mod effusion.  No bruising, other deformity. Mild medial > lateral joint line tenderness. ROM 0 - 100 degrees. Negative ant/post drawers. Negative valgus/varus testing. Negative lachmanns. Negative mcmurrays, apleys, patellar apprehension. NV intact distally.  Left knee: Mod effusion.  No bruising, other deformity. Mild medial > lateral joint line tenderness. ROM 0 - 100 degrees. Negative ant/post drawers. Negative valgus/varus testing. Negative lachmanns. Negative mcmurrays, apleys, patellar apprehension. NV intact distally.    Assessment & Plan:  1. Bilateral knee pain - 2/2 severe DJD confirmed with left knee radiographs showing tricompartmental severe DJD in June.  He is a poor surgical candidate.  Cortisone injections with  minimal benefit to date.  Second supartz injection given today.  After informed written consent, patient was lying supine on exam table. Right knee was prepped with alcohol swab and utilizing superolateral approach with ultrasound guidance, patient's right knee was injected intraarticularly with 19m marcaine followed by sJacklyn Shell  Patient tolerated the procedure well without immediate complications.  After informed written consent, patient was lying supine on exam table. Left knee was prepped with alcohol swab and utilizing superolateral approach with ultrasound guidance, patient's left knee was injected intraarticularly with 333mmarcaine followed by suJacklyn Shell Patient tolerated the procedure well without immediate complications.

## 2015-04-27 DIAGNOSIS — E876 Hypokalemia: Secondary | ICD-10-CM | POA: Diagnosis not present

## 2015-04-27 DIAGNOSIS — D631 Anemia in chronic kidney disease: Secondary | ICD-10-CM | POA: Diagnosis not present

## 2015-04-27 DIAGNOSIS — N186 End stage renal disease: Secondary | ICD-10-CM | POA: Diagnosis not present

## 2015-04-27 DIAGNOSIS — E1129 Type 2 diabetes mellitus with other diabetic kidney complication: Secondary | ICD-10-CM | POA: Diagnosis not present

## 2015-04-27 DIAGNOSIS — D509 Iron deficiency anemia, unspecified: Secondary | ICD-10-CM | POA: Diagnosis not present

## 2015-04-27 DIAGNOSIS — N2581 Secondary hyperparathyroidism of renal origin: Secondary | ICD-10-CM | POA: Diagnosis not present

## 2015-04-28 ENCOUNTER — Ambulatory Visit: Payer: Medicare Other | Admitting: Family Medicine

## 2015-04-29 DIAGNOSIS — D509 Iron deficiency anemia, unspecified: Secondary | ICD-10-CM | POA: Diagnosis not present

## 2015-04-29 DIAGNOSIS — D631 Anemia in chronic kidney disease: Secondary | ICD-10-CM | POA: Diagnosis not present

## 2015-04-29 DIAGNOSIS — N2581 Secondary hyperparathyroidism of renal origin: Secondary | ICD-10-CM | POA: Diagnosis not present

## 2015-04-29 DIAGNOSIS — E1129 Type 2 diabetes mellitus with other diabetic kidney complication: Secondary | ICD-10-CM | POA: Diagnosis not present

## 2015-04-29 DIAGNOSIS — E876 Hypokalemia: Secondary | ICD-10-CM | POA: Diagnosis not present

## 2015-04-29 DIAGNOSIS — N186 End stage renal disease: Secondary | ICD-10-CM | POA: Diagnosis not present

## 2015-05-02 ENCOUNTER — Ambulatory Visit: Payer: Medicare Other | Admitting: Family Medicine

## 2015-05-02 ENCOUNTER — Encounter: Payer: Self-pay | Admitting: Family Medicine

## 2015-05-02 ENCOUNTER — Ambulatory Visit (INDEPENDENT_AMBULATORY_CARE_PROVIDER_SITE_OTHER): Payer: Medicare Other | Admitting: Family Medicine

## 2015-05-02 VITALS — BP 118/79 | HR 77 | Ht 69.0 in | Wt 247.0 lb

## 2015-05-02 DIAGNOSIS — N2581 Secondary hyperparathyroidism of renal origin: Secondary | ICD-10-CM | POA: Diagnosis not present

## 2015-05-02 DIAGNOSIS — D509 Iron deficiency anemia, unspecified: Secondary | ICD-10-CM | POA: Diagnosis not present

## 2015-05-02 DIAGNOSIS — E1129 Type 2 diabetes mellitus with other diabetic kidney complication: Secondary | ICD-10-CM | POA: Diagnosis not present

## 2015-05-02 DIAGNOSIS — M25562 Pain in left knee: Secondary | ICD-10-CM

## 2015-05-02 DIAGNOSIS — D631 Anemia in chronic kidney disease: Secondary | ICD-10-CM | POA: Diagnosis not present

## 2015-05-02 DIAGNOSIS — M1710 Unilateral primary osteoarthritis, unspecified knee: Secondary | ICD-10-CM

## 2015-05-02 DIAGNOSIS — E11622 Type 2 diabetes mellitus with other skin ulcer: Secondary | ICD-10-CM | POA: Diagnosis not present

## 2015-05-02 DIAGNOSIS — N186 End stage renal disease: Secondary | ICD-10-CM | POA: Diagnosis not present

## 2015-05-02 DIAGNOSIS — E1122 Type 2 diabetes mellitus with diabetic chronic kidney disease: Secondary | ICD-10-CM | POA: Diagnosis not present

## 2015-05-02 DIAGNOSIS — L97211 Non-pressure chronic ulcer of right calf limited to breakdown of skin: Secondary | ICD-10-CM | POA: Diagnosis not present

## 2015-05-02 DIAGNOSIS — E876 Hypokalemia: Secondary | ICD-10-CM | POA: Diagnosis not present

## 2015-05-02 DIAGNOSIS — I87311 Chronic venous hypertension (idiopathic) with ulcer of right lower extremity: Secondary | ICD-10-CM | POA: Diagnosis not present

## 2015-05-02 DIAGNOSIS — M25561 Pain in right knee: Secondary | ICD-10-CM

## 2015-05-02 DIAGNOSIS — I132 Hypertensive heart and chronic kidney disease with heart failure and with stage 5 chronic kidney disease, or end stage renal disease: Secondary | ICD-10-CM | POA: Diagnosis not present

## 2015-05-02 DIAGNOSIS — S81801A Unspecified open wound, right lower leg, initial encounter: Secondary | ICD-10-CM | POA: Diagnosis not present

## 2015-05-03 MED ORDER — SODIUM HYALURONATE (VISCOSUP) 25 MG/2.5ML IX SOSY
2.5000 mL | PREFILLED_SYRINGE | Freq: Once | INTRA_ARTICULAR | Status: AC
  Administered 2015-05-03: 2.5 mL via INTRA_ARTICULAR

## 2015-05-04 DIAGNOSIS — D631 Anemia in chronic kidney disease: Secondary | ICD-10-CM | POA: Diagnosis not present

## 2015-05-04 DIAGNOSIS — E1129 Type 2 diabetes mellitus with other diabetic kidney complication: Secondary | ICD-10-CM | POA: Diagnosis not present

## 2015-05-04 DIAGNOSIS — I4891 Unspecified atrial fibrillation: Secondary | ICD-10-CM | POA: Diagnosis not present

## 2015-05-04 DIAGNOSIS — N186 End stage renal disease: Secondary | ICD-10-CM | POA: Diagnosis not present

## 2015-05-04 DIAGNOSIS — D509 Iron deficiency anemia, unspecified: Secondary | ICD-10-CM | POA: Diagnosis not present

## 2015-05-04 DIAGNOSIS — E876 Hypokalemia: Secondary | ICD-10-CM | POA: Diagnosis not present

## 2015-05-04 DIAGNOSIS — N2581 Secondary hyperparathyroidism of renal origin: Secondary | ICD-10-CM | POA: Diagnosis not present

## 2015-05-05 NOTE — Progress Notes (Signed)
PCP: Cathlean Cower, MD  Subjective:   HPI: Patient is a 60 y.o. male here for knee pain.  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 Previously seen by Dr. Tamala Julian but was dismissed from [facility] for missing appointments. Last cortisone injections over 3 months ago. Has done viscosupplementation as well with some relief. Has had over 10 years of grinding R > L knee pain. Pain level 10/10 on right, 6/10 on left, sharp. Worse with standing, prolonged sitting and going to get up. No skin changes, fever, other complaints.  12/21: Patient has had problems with right shoulder dating back at least to early March. He sustained a fall at that time with severe pain in right shoulder and arm. Radiographs had a small joint effusion, chronic Hill-Sachs deformity. After this fall he could no longer lift right arm and has continued to have this problem. Pain level 7/10, sharp lateral shoulder. MSK u/s by Dr. Tamala Julian showed a full thickness supraspinatus tear that was retracted, subscapularis tear. He has also had low back pain central mid-low back at 7/10 level, sharp as well. Had compression fracture diagnosed by ED in June. Some radiation into right hip area. Used calcitonin initially. No bowel/bladder dysfunction. No numbness/tingling.  03/21/15: Patient returns with continued pain, swelling in both knees to 7/10 level. Injections only helped temporarily. Low back still hurts as well centrally. No radiation into extremities - pain also 7/10 and sharp. No bowel/bladder dysfunction. No skin changes, fever. No numbness/tingling.  2/23: Patient returns to start bilateral supartz series. Knee pain 9/10 right, 8/10 left.  3/9: Patient reports knee pain down to 0/10 bilaterally. Still with swelling. No current complaints.  3/20: Patient returns for third supartz injection. + swelling.  Past Medical History  Diagnosis Date  . Glaucoma   . Hypertension   . Hyperlipidemia   .  Atrial fibrillation ([place]) 12/30/2012  . CHF (congestive heart failure) ([place]) 12/30/2012  . Diabetic retinopathy ([place]) 12/30/2012  . Morbid obesity ([place]) 12/30/2012  . Pulmonary hypertension ([place]) 12/30/2012  . Myocardial infarction ([place])     "I've had ~ 3; last one was in ~ 10/2013" (01/30/2014)  . Diabetes mellitus with neuropathy ([place])   . OSA (obstructive sleep apnea) 12/30/2012    "lost weight; no longer needed CPAP; retested said I needed it; didn't followup cause I was feeling fine" (01/30/2014)  . GERD (gastroesophageal reflux disease)   . Stroke [facility]) ~ 2005; ~ 2005    "they were mild; I didn't even notice I'd had them"; denies residual on 01/30/2014  . History of gout     "right big toe"  . Arthritis     "all over"  . Chronic lower back pain   . ESRD (end stage renal disease) on dialysis ([place]) started in 2013    MWF; [facility]; [employer]  . Refusal of blood transfusions as patient is Jehovah's Witness   . Anemia due to other cause 07/07/2014  . Allergic rhinitis 07/07/2014  . Hypothyroid     Current Outpatient Prescriptions on File Prior to Visit  Medication Sig Dispense Refill  . acetaminophen (TYLENOL) 650 MG CR tablet Take 650 mg by mouth every 8 (eight) hours as needed for pain.    Marland Kitchen allopurinol (ZYLOPRIM) 100 MG tablet Take 1 tablet (100 mg total) by mouth 2 (two) times daily. 60 tablet 1  . allopurinol (ZYLOPRIM) 100 MG tablet TAKE ONE TABLET BY MOUTH TWICE DAILY 180 tablet 1  . amiodarone (PACERONE) 200 MG tablet Take  1 tablet (200 mg total) by mouth daily. 90 tablet 1  . amitriptyline (ELAVIL) 75 MG tablet Take 1 tablet (75 mg total) by mouth at bedtime. 90 tablet 0  . Blood Glucose Monitoring Suppl (ONE TOUCH ULTRA 2) W/DEVICE KIT Use to check blood sugars twice a day Dx E11.9 1 each 0  . carvedilol (COREG) 25 MG tablet Take 25 mg by mouth 2 (two) times daily with a meal. TAKE 1/2 TABLET THE NIGHT BEFORE AND MORNING OF DIALYSIS    . collagenase (SANTYL)  ointment Apply topically daily. (Patient not taking: Reported on 02/03/2015) 15 g 1  . diazepam (VALIUM) 5 MG tablet Take 1 tablet 30 minutes prior to procedure.  May repeat x 1 if necessary. 2 tablet 0  . glucose blood (ONE TOUCH ULTRA TEST) test strip 1 each by Other route 2 (two) times daily. Use to check blood sugars twice a day Dx E11.9 100 each 3  . JANUVIA 25 MG tablet TAKE ONE TABLET BY MOUTH ONCE DAILY 90 tablet 0  . Lancets (ONETOUCH ULTRASOFT) lancets 1 each by Other route 2 (two) times daily. Use to help check blood sugars twice a day Dx E11.9 100 each 3  . loperamide (IMODIUM A-D) 2 MG tablet Take 2 mg by mouth 4 (four) times daily as needed for diarrhea or loose stools.    . ondansetron (ZOFRAN) 4 MG tablet Take 1 tablet (4 mg total) by mouth every 6 (six) hours. (Patient not taking: Reported on 02/03/2015) 30 tablet 0  . ranitidine (ZANTAC) 150 MG tablet Take 150 mg by mouth daily as needed for heartburn. Reported on 02/03/2015    . sevelamer carbonate (RENVELA) 800 MG tablet Take 1,600 mg by mouth 3 (three) times daily with meals.     . simvastatin (ZOCOR) 20 MG tablet TAKE ONE TABLET BY MOUTH ONCE DAILY 90 tablet 0  . sodium chloride (OCEAN) 0.65 % SOLN nasal spray Place 1 spray into both nostrils as needed for congestion. 104 mL 0  . traMADol (ULTRAM) 50 MG tablet Take 1 tablet (50 mg total) by mouth every 6 (six) hours as needed. 60 tablet 0  . warfarin (COUMADIN) 3 MG tablet Please take as directed by anticoagulation clinic. (Patient taking differently: Take 3-4.5 mg by mouth daily at 6 PM. Takes 4.70m on thu only Takes 368mall other days) 90 tablet 0   No current facility-administered medications on file prior to visit.    Past Surgical History  Procedure Laterality Date  . Total hip arthroplasty Right 1980  . Av fistula placement Left ~ 10/2013    "forearm"  . Closed reduction hip dislocation Right 1970's  . Cardiac catheterization    . Cardiac catheterization N/A  01/07/2015    Procedure: Left Heart Cath and Coronary Angiography;  Surgeon: ThTroy SineMD;  Location: [facility];  Service: Cardiovascular;  Laterality: N/A;    No Known Allergies  Social History   Social History  . Marital Status: Divorced    Spouse Name: N/A  . Number of Children: 2  . Years of Education: N/A   Occupational History  . Not on file.   Social History Main Topics  . Smoking status: Never Smoker   . Smokeless tobacco: Never Used  . Alcohol Use: 1.8 - 2.4 oz/week    3-4 Standard drinks or equivalent per week     Comment: 01/30/2014 "none in the last 3 weeks; primarily drink w/my friends on the weekends"  .  Drug Use: No  . Sexual Activity: Not Currently   Other Topics Concern  . Not on file   Social History Narrative    Family History  Problem Relation Age of Onset  . Arthritis Other   . Heart disease Other   . Hyperlipidemia Other   . Stroke Other   . Hypertension Other   . Kidney disease Other   . Diabetes Other     BP 118/79 mmHg  Pulse 77  Ht '5\' 9"'  (1.753 m)  Wt 247 lb (112.038 kg)  BMI 36.46 kg/m2  Review of Systems: See HPI above.    Objective:  Physical Exam:  Gen: NAD, comfortable in exam room.  Knee exam not repeated today. Right knee: Mod effusion.  No bruising, other deformity. Mild medial > lateral joint line tenderness. ROM 0 - 100 degrees. Negative ant/post drawers. Negative valgus/varus testing. Negative lachmanns. Negative mcmurrays, apleys, patellar apprehension. NV intact distally.  Left knee: Mod effusion.  No bruising, other deformity. Mild medial > lateral joint line tenderness. ROM 0 - 100 degrees. Negative ant/post drawers. Negative valgus/varus testing. Negative lachmanns. Negative mcmurrays, apleys, patellar apprehension. NV intact distally.    Assessment & Plan:  1. Bilateral knee pain - 2/2 severe DJD confirmed with left knee radiographs showing tricompartmental severe DJD in June.  He is a  poor surgical candidate.  Cortisone injections with minimal benefit to date.  Third supartz injection given today.  Call us in 4 weeks for an update on his status.  If still having problems consider 4th and 5th injections.  After informed written consent, patient was lying supine on exam table. Right knee was prepped with alcohol swab and utilizing superolateral approach, patient's right knee was injected intraarticularly with 29m marcaine followed by sJacklyn Shell  Patient tolerated the procedure well without immediate complications.  After informed written consent, patient was lying supine on exam table. Left knee was prepped with alcohol swab and utilizing superolateral approach, patient's left knee was injected intraarticularly with 348mmarcaine followed by suJacklyn Shell Patient tolerated the procedure well without immediate complications.

## 2015-05-05 NOTE — Assessment & Plan Note (Signed)
severe DJD confirmed with left knee radiographs showing tricompartmental severe DJD in June.  He is a poor surgical candidate.  Cortisone injections with minimal benefit to date.  Third supartz injection given today.  Call us in 4 weeks for an update on his status.  If still having problems consider 4th and 5th injections.  After informed written consent, patient was lying supine on exam table. Right knee was prepped with alcohol swab and utilizing superolateral approach, patient's right knee was injected intraarticularly with 32mL marcaine followed by Jacklyn Shell.  Patient tolerated the procedure well without immediate complications.  After informed written consent, patient was lying supine on exam table. Left knee was prepped with alcohol swab and utilizing superolateral approach, patient's left knee was injected intraarticularly with 72mL marcaine followed by Jacklyn Shell.  Patient tolerated the procedure well without immediate complications.

## 2015-05-07 DIAGNOSIS — E876 Hypokalemia: Secondary | ICD-10-CM | POA: Diagnosis not present

## 2015-05-07 DIAGNOSIS — N2581 Secondary hyperparathyroidism of renal origin: Secondary | ICD-10-CM | POA: Diagnosis not present

## 2015-05-07 DIAGNOSIS — E1129 Type 2 diabetes mellitus with other diabetic kidney complication: Secondary | ICD-10-CM | POA: Diagnosis not present

## 2015-05-07 DIAGNOSIS — N186 End stage renal disease: Secondary | ICD-10-CM | POA: Diagnosis not present

## 2015-05-07 DIAGNOSIS — D631 Anemia in chronic kidney disease: Secondary | ICD-10-CM | POA: Diagnosis not present

## 2015-05-07 DIAGNOSIS — D509 Iron deficiency anemia, unspecified: Secondary | ICD-10-CM | POA: Diagnosis not present

## 2015-05-09 DIAGNOSIS — D631 Anemia in chronic kidney disease: Secondary | ICD-10-CM | POA: Diagnosis not present

## 2015-05-09 DIAGNOSIS — N2581 Secondary hyperparathyroidism of renal origin: Secondary | ICD-10-CM | POA: Diagnosis not present

## 2015-05-09 DIAGNOSIS — E1129 Type 2 diabetes mellitus with other diabetic kidney complication: Secondary | ICD-10-CM | POA: Diagnosis not present

## 2015-05-09 DIAGNOSIS — I132 Hypertensive heart and chronic kidney disease with heart failure and with stage 5 chronic kidney disease, or end stage renal disease: Secondary | ICD-10-CM | POA: Diagnosis not present

## 2015-05-09 DIAGNOSIS — E876 Hypokalemia: Secondary | ICD-10-CM | POA: Diagnosis not present

## 2015-05-09 DIAGNOSIS — N186 End stage renal disease: Secondary | ICD-10-CM | POA: Diagnosis not present

## 2015-05-09 DIAGNOSIS — S81801A Unspecified open wound, right lower leg, initial encounter: Secondary | ICD-10-CM | POA: Diagnosis not present

## 2015-05-09 DIAGNOSIS — E11622 Type 2 diabetes mellitus with other skin ulcer: Secondary | ICD-10-CM | POA: Diagnosis not present

## 2015-05-09 DIAGNOSIS — E1122 Type 2 diabetes mellitus with diabetic chronic kidney disease: Secondary | ICD-10-CM | POA: Diagnosis not present

## 2015-05-09 DIAGNOSIS — L97211 Non-pressure chronic ulcer of right calf limited to breakdown of skin: Secondary | ICD-10-CM | POA: Diagnosis not present

## 2015-05-09 DIAGNOSIS — D509 Iron deficiency anemia, unspecified: Secondary | ICD-10-CM | POA: Diagnosis not present

## 2015-05-09 DIAGNOSIS — I87311 Chronic venous hypertension (idiopathic) with ulcer of right lower extremity: Secondary | ICD-10-CM | POA: Diagnosis not present

## 2015-05-10 ENCOUNTER — Other Ambulatory Visit: Payer: Self-pay | Admitting: Family Medicine

## 2015-05-11 DIAGNOSIS — D509 Iron deficiency anemia, unspecified: Secondary | ICD-10-CM | POA: Diagnosis not present

## 2015-05-11 DIAGNOSIS — N2581 Secondary hyperparathyroidism of renal origin: Secondary | ICD-10-CM | POA: Diagnosis not present

## 2015-05-11 DIAGNOSIS — E876 Hypokalemia: Secondary | ICD-10-CM | POA: Diagnosis not present

## 2015-05-11 DIAGNOSIS — N186 End stage renal disease: Secondary | ICD-10-CM | POA: Diagnosis not present

## 2015-05-11 DIAGNOSIS — E1129 Type 2 diabetes mellitus with other diabetic kidney complication: Secondary | ICD-10-CM | POA: Diagnosis not present

## 2015-05-11 DIAGNOSIS — D631 Anemia in chronic kidney disease: Secondary | ICD-10-CM | POA: Diagnosis not present

## 2015-05-13 DIAGNOSIS — Z992 Dependence on renal dialysis: Secondary | ICD-10-CM | POA: Diagnosis not present

## 2015-05-13 DIAGNOSIS — E1129 Type 2 diabetes mellitus with other diabetic kidney complication: Secondary | ICD-10-CM | POA: Diagnosis not present

## 2015-05-13 DIAGNOSIS — N2581 Secondary hyperparathyroidism of renal origin: Secondary | ICD-10-CM | POA: Diagnosis not present

## 2015-05-13 DIAGNOSIS — N186 End stage renal disease: Secondary | ICD-10-CM | POA: Diagnosis not present

## 2015-05-13 DIAGNOSIS — D631 Anemia in chronic kidney disease: Secondary | ICD-10-CM | POA: Diagnosis not present

## 2015-05-13 DIAGNOSIS — I120 Hypertensive chronic kidney disease with stage 5 chronic kidney disease or end stage renal disease: Secondary | ICD-10-CM | POA: Diagnosis not present

## 2015-05-13 DIAGNOSIS — E876 Hypokalemia: Secondary | ICD-10-CM | POA: Diagnosis not present

## 2015-05-13 DIAGNOSIS — D509 Iron deficiency anemia, unspecified: Secondary | ICD-10-CM | POA: Diagnosis not present

## 2015-05-16 ENCOUNTER — Encounter (HOSPITAL_BASED_OUTPATIENT_CLINIC_OR_DEPARTMENT_OTHER): Payer: Medicare Other | Attending: Internal Medicine

## 2015-05-16 DIAGNOSIS — I509 Heart failure, unspecified: Secondary | ICD-10-CM | POA: Diagnosis not present

## 2015-05-16 DIAGNOSIS — N186 End stage renal disease: Secondary | ICD-10-CM | POA: Diagnosis not present

## 2015-05-16 DIAGNOSIS — I252 Old myocardial infarction: Secondary | ICD-10-CM | POA: Diagnosis not present

## 2015-05-16 DIAGNOSIS — E1140 Type 2 diabetes mellitus with diabetic neuropathy, unspecified: Secondary | ICD-10-CM | POA: Insufficient documentation

## 2015-05-16 DIAGNOSIS — M109 Gout, unspecified: Secondary | ICD-10-CM | POA: Insufficient documentation

## 2015-05-16 DIAGNOSIS — H409 Unspecified glaucoma: Secondary | ICD-10-CM | POA: Insufficient documentation

## 2015-05-16 DIAGNOSIS — D509 Iron deficiency anemia, unspecified: Secondary | ICD-10-CM | POA: Diagnosis not present

## 2015-05-16 DIAGNOSIS — E1129 Type 2 diabetes mellitus with other diabetic kidney complication: Secondary | ICD-10-CM | POA: Diagnosis not present

## 2015-05-16 DIAGNOSIS — E1122 Type 2 diabetes mellitus with diabetic chronic kidney disease: Secondary | ICD-10-CM | POA: Insufficient documentation

## 2015-05-16 DIAGNOSIS — I132 Hypertensive heart and chronic kidney disease with heart failure and with stage 5 chronic kidney disease, or end stage renal disease: Secondary | ICD-10-CM | POA: Diagnosis not present

## 2015-05-16 DIAGNOSIS — I87311 Chronic venous hypertension (idiopathic) with ulcer of right lower extremity: Secondary | ICD-10-CM | POA: Insufficient documentation

## 2015-05-16 DIAGNOSIS — L97811 Non-pressure chronic ulcer of other part of right lower leg limited to breakdown of skin: Secondary | ICD-10-CM | POA: Insufficient documentation

## 2015-05-16 DIAGNOSIS — D631 Anemia in chronic kidney disease: Secondary | ICD-10-CM | POA: Diagnosis not present

## 2015-05-16 DIAGNOSIS — G4730 Sleep apnea, unspecified: Secondary | ICD-10-CM | POA: Diagnosis not present

## 2015-05-16 DIAGNOSIS — E11622 Type 2 diabetes mellitus with other skin ulcer: Secondary | ICD-10-CM | POA: Insufficient documentation

## 2015-05-16 DIAGNOSIS — E876 Hypokalemia: Secondary | ICD-10-CM | POA: Diagnosis not present

## 2015-05-16 DIAGNOSIS — L97211 Non-pressure chronic ulcer of right calf limited to breakdown of skin: Secondary | ICD-10-CM | POA: Diagnosis not present

## 2015-05-19 DIAGNOSIS — E876 Hypokalemia: Secondary | ICD-10-CM | POA: Diagnosis not present

## 2015-05-19 DIAGNOSIS — D631 Anemia in chronic kidney disease: Secondary | ICD-10-CM | POA: Diagnosis not present

## 2015-05-19 DIAGNOSIS — N186 End stage renal disease: Secondary | ICD-10-CM | POA: Diagnosis not present

## 2015-05-19 DIAGNOSIS — E1129 Type 2 diabetes mellitus with other diabetic kidney complication: Secondary | ICD-10-CM | POA: Diagnosis not present

## 2015-05-19 DIAGNOSIS — D509 Iron deficiency anemia, unspecified: Secondary | ICD-10-CM | POA: Diagnosis not present

## 2015-05-20 DIAGNOSIS — D631 Anemia in chronic kidney disease: Secondary | ICD-10-CM | POA: Diagnosis not present

## 2015-05-20 DIAGNOSIS — E1129 Type 2 diabetes mellitus with other diabetic kidney complication: Secondary | ICD-10-CM | POA: Diagnosis not present

## 2015-05-20 DIAGNOSIS — D509 Iron deficiency anemia, unspecified: Secondary | ICD-10-CM | POA: Diagnosis not present

## 2015-05-20 DIAGNOSIS — N186 End stage renal disease: Secondary | ICD-10-CM | POA: Diagnosis not present

## 2015-05-20 DIAGNOSIS — E876 Hypokalemia: Secondary | ICD-10-CM | POA: Diagnosis not present

## 2015-05-23 DIAGNOSIS — N186 End stage renal disease: Secondary | ICD-10-CM | POA: Diagnosis not present

## 2015-05-23 DIAGNOSIS — D631 Anemia in chronic kidney disease: Secondary | ICD-10-CM | POA: Diagnosis not present

## 2015-05-23 DIAGNOSIS — E876 Hypokalemia: Secondary | ICD-10-CM | POA: Diagnosis not present

## 2015-05-23 DIAGNOSIS — D509 Iron deficiency anemia, unspecified: Secondary | ICD-10-CM | POA: Diagnosis not present

## 2015-05-23 DIAGNOSIS — E1129 Type 2 diabetes mellitus with other diabetic kidney complication: Secondary | ICD-10-CM | POA: Diagnosis not present

## 2015-05-25 DIAGNOSIS — D509 Iron deficiency anemia, unspecified: Secondary | ICD-10-CM | POA: Diagnosis not present

## 2015-05-25 DIAGNOSIS — E1129 Type 2 diabetes mellitus with other diabetic kidney complication: Secondary | ICD-10-CM | POA: Diagnosis not present

## 2015-05-25 DIAGNOSIS — E876 Hypokalemia: Secondary | ICD-10-CM | POA: Diagnosis not present

## 2015-05-25 DIAGNOSIS — N186 End stage renal disease: Secondary | ICD-10-CM | POA: Diagnosis not present

## 2015-05-25 DIAGNOSIS — D631 Anemia in chronic kidney disease: Secondary | ICD-10-CM | POA: Diagnosis not present

## 2015-05-27 DIAGNOSIS — N186 End stage renal disease: Secondary | ICD-10-CM | POA: Diagnosis not present

## 2015-05-27 DIAGNOSIS — D631 Anemia in chronic kidney disease: Secondary | ICD-10-CM | POA: Diagnosis not present

## 2015-05-27 DIAGNOSIS — E1129 Type 2 diabetes mellitus with other diabetic kidney complication: Secondary | ICD-10-CM | POA: Diagnosis not present

## 2015-05-27 DIAGNOSIS — D509 Iron deficiency anemia, unspecified: Secondary | ICD-10-CM | POA: Diagnosis not present

## 2015-05-27 DIAGNOSIS — E876 Hypokalemia: Secondary | ICD-10-CM | POA: Diagnosis not present

## 2015-05-31 DIAGNOSIS — N186 End stage renal disease: Secondary | ICD-10-CM | POA: Diagnosis not present

## 2015-06-02 DIAGNOSIS — N186 End stage renal disease: Secondary | ICD-10-CM | POA: Diagnosis not present

## 2015-06-04 DIAGNOSIS — N186 End stage renal disease: Secondary | ICD-10-CM | POA: Diagnosis not present

## 2015-06-06 DIAGNOSIS — N186 End stage renal disease: Secondary | ICD-10-CM | POA: Diagnosis not present

## 2015-06-08 DIAGNOSIS — I4891 Unspecified atrial fibrillation: Secondary | ICD-10-CM | POA: Diagnosis not present

## 2015-06-08 DIAGNOSIS — E876 Hypokalemia: Secondary | ICD-10-CM | POA: Diagnosis not present

## 2015-06-08 DIAGNOSIS — D631 Anemia in chronic kidney disease: Secondary | ICD-10-CM | POA: Diagnosis not present

## 2015-06-08 DIAGNOSIS — D509 Iron deficiency anemia, unspecified: Secondary | ICD-10-CM | POA: Diagnosis not present

## 2015-06-08 DIAGNOSIS — N186 End stage renal disease: Secondary | ICD-10-CM | POA: Diagnosis not present

## 2015-06-08 DIAGNOSIS — E1129 Type 2 diabetes mellitus with other diabetic kidney complication: Secondary | ICD-10-CM | POA: Diagnosis not present

## 2015-06-10 DIAGNOSIS — N186 End stage renal disease: Secondary | ICD-10-CM | POA: Diagnosis not present

## 2015-06-10 DIAGNOSIS — E1129 Type 2 diabetes mellitus with other diabetic kidney complication: Secondary | ICD-10-CM | POA: Diagnosis not present

## 2015-06-10 DIAGNOSIS — D631 Anemia in chronic kidney disease: Secondary | ICD-10-CM | POA: Diagnosis not present

## 2015-06-10 DIAGNOSIS — E876 Hypokalemia: Secondary | ICD-10-CM | POA: Diagnosis not present

## 2015-06-10 DIAGNOSIS — D509 Iron deficiency anemia, unspecified: Secondary | ICD-10-CM | POA: Diagnosis not present

## 2015-06-12 DIAGNOSIS — N186 End stage renal disease: Secondary | ICD-10-CM | POA: Diagnosis not present

## 2015-06-12 DIAGNOSIS — Z992 Dependence on renal dialysis: Secondary | ICD-10-CM | POA: Diagnosis not present

## 2015-06-12 DIAGNOSIS — I120 Hypertensive chronic kidney disease with stage 5 chronic kidney disease or end stage renal disease: Secondary | ICD-10-CM | POA: Diagnosis not present

## 2015-06-13 DIAGNOSIS — E876 Hypokalemia: Secondary | ICD-10-CM | POA: Diagnosis not present

## 2015-06-13 DIAGNOSIS — E1129 Type 2 diabetes mellitus with other diabetic kidney complication: Secondary | ICD-10-CM | POA: Diagnosis not present

## 2015-06-13 DIAGNOSIS — D631 Anemia in chronic kidney disease: Secondary | ICD-10-CM | POA: Diagnosis not present

## 2015-06-13 DIAGNOSIS — N186 End stage renal disease: Secondary | ICD-10-CM | POA: Diagnosis not present

## 2015-06-13 DIAGNOSIS — E8779 Other fluid overload: Secondary | ICD-10-CM | POA: Diagnosis not present

## 2015-06-14 DIAGNOSIS — N186 End stage renal disease: Secondary | ICD-10-CM | POA: Diagnosis not present

## 2015-06-14 DIAGNOSIS — D631 Anemia in chronic kidney disease: Secondary | ICD-10-CM | POA: Diagnosis not present

## 2015-06-14 DIAGNOSIS — E1129 Type 2 diabetes mellitus with other diabetic kidney complication: Secondary | ICD-10-CM | POA: Diagnosis not present

## 2015-06-14 DIAGNOSIS — E876 Hypokalemia: Secondary | ICD-10-CM | POA: Diagnosis not present

## 2015-06-14 DIAGNOSIS — E8779 Other fluid overload: Secondary | ICD-10-CM | POA: Diagnosis not present

## 2015-06-15 DIAGNOSIS — E876 Hypokalemia: Secondary | ICD-10-CM | POA: Diagnosis not present

## 2015-06-15 DIAGNOSIS — D631 Anemia in chronic kidney disease: Secondary | ICD-10-CM | POA: Diagnosis not present

## 2015-06-15 DIAGNOSIS — N186 End stage renal disease: Secondary | ICD-10-CM | POA: Diagnosis not present

## 2015-06-15 DIAGNOSIS — E1129 Type 2 diabetes mellitus with other diabetic kidney complication: Secondary | ICD-10-CM | POA: Diagnosis not present

## 2015-06-15 DIAGNOSIS — E8779 Other fluid overload: Secondary | ICD-10-CM | POA: Diagnosis not present

## 2015-06-17 DIAGNOSIS — N186 End stage renal disease: Secondary | ICD-10-CM | POA: Diagnosis not present

## 2015-06-17 DIAGNOSIS — D631 Anemia in chronic kidney disease: Secondary | ICD-10-CM | POA: Diagnosis not present

## 2015-06-17 DIAGNOSIS — E876 Hypokalemia: Secondary | ICD-10-CM | POA: Diagnosis not present

## 2015-06-17 DIAGNOSIS — E8779 Other fluid overload: Secondary | ICD-10-CM | POA: Diagnosis not present

## 2015-06-17 DIAGNOSIS — E1129 Type 2 diabetes mellitus with other diabetic kidney complication: Secondary | ICD-10-CM | POA: Diagnosis not present

## 2015-06-20 DIAGNOSIS — E876 Hypokalemia: Secondary | ICD-10-CM | POA: Diagnosis not present

## 2015-06-20 DIAGNOSIS — N186 End stage renal disease: Secondary | ICD-10-CM | POA: Diagnosis not present

## 2015-06-20 DIAGNOSIS — D631 Anemia in chronic kidney disease: Secondary | ICD-10-CM | POA: Diagnosis not present

## 2015-06-20 DIAGNOSIS — E8779 Other fluid overload: Secondary | ICD-10-CM | POA: Diagnosis not present

## 2015-06-20 DIAGNOSIS — E1129 Type 2 diabetes mellitus with other diabetic kidney complication: Secondary | ICD-10-CM | POA: Diagnosis not present

## 2015-06-21 DIAGNOSIS — N186 End stage renal disease: Secondary | ICD-10-CM | POA: Diagnosis not present

## 2015-06-21 DIAGNOSIS — E1143 Type 2 diabetes mellitus with diabetic autonomic (poly)neuropathy: Secondary | ICD-10-CM | POA: Diagnosis not present

## 2015-06-21 DIAGNOSIS — I5020 Unspecified systolic (congestive) heart failure: Secondary | ICD-10-CM | POA: Diagnosis not present

## 2015-06-21 DIAGNOSIS — I10 Essential (primary) hypertension: Secondary | ICD-10-CM | POA: Diagnosis not present

## 2015-06-21 DIAGNOSIS — M109 Gout, unspecified: Secondary | ICD-10-CM | POA: Diagnosis not present

## 2015-06-21 DIAGNOSIS — E7800 Pure hypercholesterolemia, unspecified: Secondary | ICD-10-CM | POA: Diagnosis not present

## 2015-06-21 DIAGNOSIS — I482 Chronic atrial fibrillation: Secondary | ICD-10-CM | POA: Diagnosis not present

## 2015-06-21 DIAGNOSIS — Z7984 Long term (current) use of oral hypoglycemic drugs: Secondary | ICD-10-CM | POA: Diagnosis not present

## 2015-06-21 DIAGNOSIS — M170 Bilateral primary osteoarthritis of knee: Secondary | ICD-10-CM | POA: Diagnosis not present

## 2015-06-22 DIAGNOSIS — E8779 Other fluid overload: Secondary | ICD-10-CM | POA: Diagnosis not present

## 2015-06-22 DIAGNOSIS — E876 Hypokalemia: Secondary | ICD-10-CM | POA: Diagnosis not present

## 2015-06-22 DIAGNOSIS — D631 Anemia in chronic kidney disease: Secondary | ICD-10-CM | POA: Diagnosis not present

## 2015-06-22 DIAGNOSIS — E1129 Type 2 diabetes mellitus with other diabetic kidney complication: Secondary | ICD-10-CM | POA: Diagnosis not present

## 2015-06-22 DIAGNOSIS — N186 End stage renal disease: Secondary | ICD-10-CM | POA: Diagnosis not present

## 2015-06-24 DIAGNOSIS — N186 End stage renal disease: Secondary | ICD-10-CM | POA: Diagnosis not present

## 2015-06-24 DIAGNOSIS — E1129 Type 2 diabetes mellitus with other diabetic kidney complication: Secondary | ICD-10-CM | POA: Diagnosis not present

## 2015-06-24 DIAGNOSIS — E8779 Other fluid overload: Secondary | ICD-10-CM | POA: Diagnosis not present

## 2015-06-24 DIAGNOSIS — E876 Hypokalemia: Secondary | ICD-10-CM | POA: Diagnosis not present

## 2015-06-24 DIAGNOSIS — D631 Anemia in chronic kidney disease: Secondary | ICD-10-CM | POA: Diagnosis not present

## 2015-06-27 ENCOUNTER — Encounter (HOSPITAL_BASED_OUTPATIENT_CLINIC_OR_DEPARTMENT_OTHER): Payer: Medicare Other | Attending: Internal Medicine

## 2015-06-27 DIAGNOSIS — E876 Hypokalemia: Secondary | ICD-10-CM | POA: Diagnosis not present

## 2015-06-27 DIAGNOSIS — E1129 Type 2 diabetes mellitus with other diabetic kidney complication: Secondary | ICD-10-CM | POA: Diagnosis not present

## 2015-06-27 DIAGNOSIS — D631 Anemia in chronic kidney disease: Secondary | ICD-10-CM | POA: Diagnosis not present

## 2015-06-27 DIAGNOSIS — E8779 Other fluid overload: Secondary | ICD-10-CM | POA: Diagnosis not present

## 2015-06-27 DIAGNOSIS — N186 End stage renal disease: Secondary | ICD-10-CM | POA: Diagnosis not present

## 2015-06-29 DIAGNOSIS — E8779 Other fluid overload: Secondary | ICD-10-CM | POA: Diagnosis not present

## 2015-06-29 DIAGNOSIS — E876 Hypokalemia: Secondary | ICD-10-CM | POA: Diagnosis not present

## 2015-06-29 DIAGNOSIS — N186 End stage renal disease: Secondary | ICD-10-CM | POA: Diagnosis not present

## 2015-06-29 DIAGNOSIS — D631 Anemia in chronic kidney disease: Secondary | ICD-10-CM | POA: Diagnosis not present

## 2015-06-29 DIAGNOSIS — E1129 Type 2 diabetes mellitus with other diabetic kidney complication: Secondary | ICD-10-CM | POA: Diagnosis not present

## 2015-07-01 DIAGNOSIS — E1129 Type 2 diabetes mellitus with other diabetic kidney complication: Secondary | ICD-10-CM | POA: Diagnosis not present

## 2015-07-01 DIAGNOSIS — N186 End stage renal disease: Secondary | ICD-10-CM | POA: Diagnosis not present

## 2015-07-01 DIAGNOSIS — E876 Hypokalemia: Secondary | ICD-10-CM | POA: Diagnosis not present

## 2015-07-01 DIAGNOSIS — E8779 Other fluid overload: Secondary | ICD-10-CM | POA: Diagnosis not present

## 2015-07-01 DIAGNOSIS — D631 Anemia in chronic kidney disease: Secondary | ICD-10-CM | POA: Diagnosis not present

## 2015-07-04 ENCOUNTER — Telehealth: Payer: Self-pay | Admitting: Family Medicine

## 2015-07-04 DIAGNOSIS — N186 End stage renal disease: Secondary | ICD-10-CM | POA: Diagnosis not present

## 2015-07-04 NOTE — Telephone Encounter (Signed)
We can see him.  He has to make all his appointments moving forward though and if he changes an appointment he must do so with at least 24 hours notice.

## 2015-07-06 DIAGNOSIS — E1129 Type 2 diabetes mellitus with other diabetic kidney complication: Secondary | ICD-10-CM | POA: Diagnosis not present

## 2015-07-06 DIAGNOSIS — E876 Hypokalemia: Secondary | ICD-10-CM | POA: Diagnosis not present

## 2015-07-06 DIAGNOSIS — E8779 Other fluid overload: Secondary | ICD-10-CM | POA: Diagnosis not present

## 2015-07-06 DIAGNOSIS — I4891 Unspecified atrial fibrillation: Secondary | ICD-10-CM | POA: Diagnosis not present

## 2015-07-06 DIAGNOSIS — D631 Anemia in chronic kidney disease: Secondary | ICD-10-CM | POA: Diagnosis not present

## 2015-07-06 DIAGNOSIS — N186 End stage renal disease: Secondary | ICD-10-CM | POA: Diagnosis not present

## 2015-07-07 ENCOUNTER — Ambulatory Visit: Payer: Medicare Other | Admitting: Family Medicine

## 2015-07-08 DIAGNOSIS — E8779 Other fluid overload: Secondary | ICD-10-CM | POA: Diagnosis not present

## 2015-07-08 DIAGNOSIS — E876 Hypokalemia: Secondary | ICD-10-CM | POA: Diagnosis not present

## 2015-07-08 DIAGNOSIS — D631 Anemia in chronic kidney disease: Secondary | ICD-10-CM | POA: Diagnosis not present

## 2015-07-08 DIAGNOSIS — E1129 Type 2 diabetes mellitus with other diabetic kidney complication: Secondary | ICD-10-CM | POA: Diagnosis not present

## 2015-07-08 DIAGNOSIS — N186 End stage renal disease: Secondary | ICD-10-CM | POA: Diagnosis not present

## 2015-07-11 DIAGNOSIS — N186 End stage renal disease: Secondary | ICD-10-CM | POA: Diagnosis not present

## 2015-07-11 DIAGNOSIS — E1129 Type 2 diabetes mellitus with other diabetic kidney complication: Secondary | ICD-10-CM | POA: Diagnosis not present

## 2015-07-11 DIAGNOSIS — E876 Hypokalemia: Secondary | ICD-10-CM | POA: Diagnosis not present

## 2015-07-11 DIAGNOSIS — D631 Anemia in chronic kidney disease: Secondary | ICD-10-CM | POA: Diagnosis not present

## 2015-07-11 DIAGNOSIS — E8779 Other fluid overload: Secondary | ICD-10-CM | POA: Diagnosis not present

## 2015-07-12 DIAGNOSIS — Z992 Dependence on renal dialysis: Secondary | ICD-10-CM | POA: Diagnosis not present

## 2015-07-12 DIAGNOSIS — I871 Compression of vein: Secondary | ICD-10-CM | POA: Diagnosis not present

## 2015-07-12 DIAGNOSIS — T82858D Stenosis of vascular prosthetic devices, implants and grafts, subsequent encounter: Secondary | ICD-10-CM | POA: Diagnosis not present

## 2015-07-12 DIAGNOSIS — N186 End stage renal disease: Secondary | ICD-10-CM | POA: Diagnosis not present

## 2015-07-13 DIAGNOSIS — N186 End stage renal disease: Secondary | ICD-10-CM | POA: Diagnosis not present

## 2015-07-13 DIAGNOSIS — E876 Hypokalemia: Secondary | ICD-10-CM | POA: Diagnosis not present

## 2015-07-13 DIAGNOSIS — E8779 Other fluid overload: Secondary | ICD-10-CM | POA: Diagnosis not present

## 2015-07-13 DIAGNOSIS — I120 Hypertensive chronic kidney disease with stage 5 chronic kidney disease or end stage renal disease: Secondary | ICD-10-CM | POA: Diagnosis not present

## 2015-07-13 DIAGNOSIS — D631 Anemia in chronic kidney disease: Secondary | ICD-10-CM | POA: Diagnosis not present

## 2015-07-13 DIAGNOSIS — Z992 Dependence on renal dialysis: Secondary | ICD-10-CM | POA: Diagnosis not present

## 2015-07-13 DIAGNOSIS — E1129 Type 2 diabetes mellitus with other diabetic kidney complication: Secondary | ICD-10-CM | POA: Diagnosis not present

## 2015-07-15 DIAGNOSIS — D631 Anemia in chronic kidney disease: Secondary | ICD-10-CM | POA: Diagnosis not present

## 2015-07-15 DIAGNOSIS — Z992 Dependence on renal dialysis: Secondary | ICD-10-CM | POA: Diagnosis not present

## 2015-07-15 DIAGNOSIS — N186 End stage renal disease: Secondary | ICD-10-CM | POA: Diagnosis not present

## 2015-07-18 ENCOUNTER — Encounter (HOSPITAL_BASED_OUTPATIENT_CLINIC_OR_DEPARTMENT_OTHER): Payer: Medicare Other | Attending: Internal Medicine

## 2015-07-18 DIAGNOSIS — Z8631 Personal history of diabetic foot ulcer: Secondary | ICD-10-CM | POA: Diagnosis not present

## 2015-07-18 DIAGNOSIS — N186 End stage renal disease: Secondary | ICD-10-CM | POA: Insufficient documentation

## 2015-07-18 DIAGNOSIS — Z992 Dependence on renal dialysis: Secondary | ICD-10-CM | POA: Diagnosis not present

## 2015-07-18 DIAGNOSIS — I252 Old myocardial infarction: Secondary | ICD-10-CM | POA: Diagnosis not present

## 2015-07-18 DIAGNOSIS — M109 Gout, unspecified: Secondary | ICD-10-CM | POA: Insufficient documentation

## 2015-07-18 DIAGNOSIS — I4891 Unspecified atrial fibrillation: Secondary | ICD-10-CM | POA: Diagnosis not present

## 2015-07-18 DIAGNOSIS — H409 Unspecified glaucoma: Secondary | ICD-10-CM | POA: Insufficient documentation

## 2015-07-18 DIAGNOSIS — Z7901 Long term (current) use of anticoagulants: Secondary | ICD-10-CM | POA: Diagnosis not present

## 2015-07-18 DIAGNOSIS — Z872 Personal history of diseases of the skin and subcutaneous tissue: Secondary | ICD-10-CM | POA: Diagnosis not present

## 2015-07-18 DIAGNOSIS — Z8673 Personal history of transient ischemic attack (TIA), and cerebral infarction without residual deficits: Secondary | ICD-10-CM | POA: Diagnosis not present

## 2015-07-18 DIAGNOSIS — I272 Other secondary pulmonary hypertension: Secondary | ICD-10-CM | POA: Diagnosis not present

## 2015-07-18 DIAGNOSIS — E1122 Type 2 diabetes mellitus with diabetic chronic kidney disease: Secondary | ICD-10-CM | POA: Insufficient documentation

## 2015-07-18 DIAGNOSIS — I132 Hypertensive heart and chronic kidney disease with heart failure and with stage 5 chronic kidney disease, or end stage renal disease: Secondary | ICD-10-CM | POA: Insufficient documentation

## 2015-07-18 DIAGNOSIS — E1140 Type 2 diabetes mellitus with diabetic neuropathy, unspecified: Secondary | ICD-10-CM | POA: Insufficient documentation

## 2015-07-18 DIAGNOSIS — I87301 Chronic venous hypertension (idiopathic) without complications of right lower extremity: Secondary | ICD-10-CM | POA: Insufficient documentation

## 2015-07-18 DIAGNOSIS — D631 Anemia in chronic kidney disease: Secondary | ICD-10-CM | POA: Diagnosis not present

## 2015-07-18 DIAGNOSIS — G4730 Sleep apnea, unspecified: Secondary | ICD-10-CM | POA: Insufficient documentation

## 2015-07-18 DIAGNOSIS — Z09 Encounter for follow-up examination after completed treatment for conditions other than malignant neoplasm: Secondary | ICD-10-CM | POA: Insufficient documentation

## 2015-07-18 DIAGNOSIS — I509 Heart failure, unspecified: Secondary | ICD-10-CM | POA: Diagnosis not present

## 2015-07-20 DIAGNOSIS — D631 Anemia in chronic kidney disease: Secondary | ICD-10-CM | POA: Diagnosis not present

## 2015-07-20 DIAGNOSIS — N186 End stage renal disease: Secondary | ICD-10-CM | POA: Diagnosis not present

## 2015-07-20 DIAGNOSIS — Z992 Dependence on renal dialysis: Secondary | ICD-10-CM | POA: Diagnosis not present

## 2015-07-22 DIAGNOSIS — Z992 Dependence on renal dialysis: Secondary | ICD-10-CM | POA: Diagnosis not present

## 2015-07-22 DIAGNOSIS — N186 End stage renal disease: Secondary | ICD-10-CM | POA: Diagnosis not present

## 2015-07-22 DIAGNOSIS — D631 Anemia in chronic kidney disease: Secondary | ICD-10-CM | POA: Diagnosis not present

## 2015-07-25 DIAGNOSIS — D631 Anemia in chronic kidney disease: Secondary | ICD-10-CM | POA: Diagnosis not present

## 2015-07-25 DIAGNOSIS — Z992 Dependence on renal dialysis: Secondary | ICD-10-CM | POA: Diagnosis not present

## 2015-07-25 DIAGNOSIS — N186 End stage renal disease: Secondary | ICD-10-CM | POA: Diagnosis not present

## 2015-07-26 DIAGNOSIS — M170 Bilateral primary osteoarthritis of knee: Secondary | ICD-10-CM | POA: Diagnosis not present

## 2015-07-26 DIAGNOSIS — L730 Acne keloid: Secondary | ICD-10-CM | POA: Diagnosis not present

## 2015-07-26 DIAGNOSIS — M545 Low back pain: Secondary | ICD-10-CM | POA: Diagnosis not present

## 2015-07-26 DIAGNOSIS — G8929 Other chronic pain: Secondary | ICD-10-CM | POA: Diagnosis not present

## 2015-07-27 DIAGNOSIS — N186 End stage renal disease: Secondary | ICD-10-CM | POA: Diagnosis not present

## 2015-07-27 DIAGNOSIS — D631 Anemia in chronic kidney disease: Secondary | ICD-10-CM | POA: Diagnosis not present

## 2015-07-27 DIAGNOSIS — Z992 Dependence on renal dialysis: Secondary | ICD-10-CM | POA: Diagnosis not present

## 2015-07-28 ENCOUNTER — Encounter: Payer: Self-pay | Admitting: Internal Medicine

## 2015-07-28 ENCOUNTER — Ambulatory Visit (INDEPENDENT_AMBULATORY_CARE_PROVIDER_SITE_OTHER): Payer: Medicare Other | Admitting: Internal Medicine

## 2015-07-28 VITALS — BP 108/67 | HR 75 | Ht 69.0 in | Wt 247.0 lb

## 2015-07-28 DIAGNOSIS — I469 Cardiac arrest, cause unspecified: Secondary | ICD-10-CM

## 2015-07-28 DIAGNOSIS — N186 End stage renal disease: Secondary | ICD-10-CM

## 2015-07-28 DIAGNOSIS — Z992 Dependence on renal dialysis: Secondary | ICD-10-CM

## 2015-07-28 DIAGNOSIS — I480 Paroxysmal atrial fibrillation: Secondary | ICD-10-CM

## 2015-07-28 DIAGNOSIS — I5020 Unspecified systolic (congestive) heart failure: Secondary | ICD-10-CM | POA: Diagnosis not present

## 2015-07-28 NOTE — Patient Instructions (Signed)
Your physician has recommended you make the following change in your medication: STOP amiodarone  Dr. Debara Pickett said you can HOLD carvedilol the mornings of dialysis  Your physician wants you to follow-up in: 6 months with Dr. Debara Pickett. You will receive a reminder letter in the mail two months in advance. If you don't receive a letter, please call our office to schedule the follow-up appointment.

## 2015-07-28 NOTE — Progress Notes (Signed)
OFFICE NOTE  Chief Complaint:  Follow-up hospitalization  Primary Care Physician: [name], MD  HPI:  Daniel Whitney is a very pleasant 60 year old male who is retired and EMS with the [employer]. He was last assigned to work in the [place]. He does have family members in [place] and after retirement moved to this area to be closer to his family. Unfortunately, he has a significant past medical history including heart failure, with a cardiac catheterization in the past that apparently showed some coronary artery disease, but he denies ever having an MI or prior stenting. He also has a history of hypertension, dyslipidemia, end-stage renal disease and has been on dialysis for 2 years, and atrial fibrillation on warfarin. He has had a prior stroke and has diabetes, but is not on insulin. He is also morbidly obese. His Coumadin levels are now being followed by his primary care provider Dr. Jenny Reichmann, and he reports his last INR was 2.1.  With regards to atrial fibrillation he says these had it for approximately 2-3 years.  He initially underwent a TEE cardioversion which converted him to sinus for short period of time and then ultimately went back into atrial fibrillation. I believe he was started on amiodarone at this time and underwent a subsequent cardioversion which was not successful.  He has been maintained on amiodarone, but believes that he has been in atrial fibrillation for some time. He reported to me that he may have felt a little more energy when he was in sinus rhythm but it is difficult for him to tell. He is currently denying any chest pain or worsening shortness of breath. He reports his weight varies somewhat from week to week and is generally managed at dialysis. He is aneuric. The degree of his cardiomyopathy is unknown at this time and we will request records from his prior cardiologist (Dr. Royanne Foots) and electrophysiologist (Dr. Leonia Reeves) in the  [place].  07/28/2015  Daniel Whitney returns today for follow-up. I last saw him in the office as a new patient in 2015. Subsequently he presented in May 2016 after missing 2 dialysis sessions with hyperkalemia and was noted to present in cardiac arrest. He was successfully resuscitated. He again presented with PA arrest in November 2016 and was hyperkalemic with a potassium of 7. At the time he was noted to have an EF of 20% however cardiac catheterization revealed mild distal RCA disease but no obstructive coronary disease. 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 Based on presentation and improvement in LV function he was not a candidate for AICD. He was placed on amiodarone for rate control and this is been weaned down to 200 mg daily. He also is on carvedilol but says it is associated with hypotension at dialysis. It seems that he has intermittent atrial fibrillation or flutter and there are clearly documented periods of sinus rhythm but he is asymptomatic with this. He is on warfarin for anticoagulation. He recently was dismissed by his primary care provider (Dr. Jenny Reichmann) but reestablished primary care with Dr. Dorthy Cooler in the [place] practice. At this time, is not clear who is following his INRs however he says that he has had them drawn at dialysis in the past. We will try to arrange to follow those in our office.  PMHx:  Past Medical History  Diagnosis Date  . Glaucoma   . Hypertension   . Hyperlipidemia   . Atrial fibrillation ([place]) 12/30/2012  .  CHF (congestive heart failure) ([place]) 12/30/2012  . Diabetic retinopathy ([place]) 12/30/2012  . Morbid obesity ([place]) 12/30/2012  . Pulmonary hypertension ([place]) 12/30/2012  . Myocardial infarction ([place])     "I've had ~ 3; last one was in ~ 10/2013" (01/30/2014)  . Diabetes mellitus with neuropathy ([place])   . OSA (obstructive sleep apnea) 12/30/2012    "lost weight; no longer needed CPAP; retested said I  needed it; didn't followup cause I was feeling fine" (01/30/2014)  . GERD (gastroesophageal reflux disease)   . Stroke [facility]) ~ 2005; ~ 2005    "they were mild; I didn't even notice I'd had them"; denies residual on 01/30/2014  . History of gout     "right big toe"  . Arthritis     "all over"  . Chronic lower back pain   . ESRD (end stage renal disease) on dialysis ([place]) started in 2013    MWF; [facility]; [facility]  . Refusal of blood transfusions as patient is Jehovah's Witness   . Anemia due to other cause 07/07/2014  . Allergic rhinitis 07/07/2014  . Hypothyroid     Past Surgical History  Procedure Laterality Date  . Total hip arthroplasty Right 1980  . Av fistula placement Left ~ 10/2013    "forearm"  . Closed reduction hip dislocation Right 1970's  . Cardiac catheterization    . Cardiac catheterization N/A 01/07/2015    Procedure: Left Heart Cath and Coronary Angiography;  Surgeon: Troy Sine, MD;  Location: [facility];  Service: Cardiovascular;  Laterality: N/A;    FAMHx:  Family History  Problem Relation Age of Onset  . Arthritis Other   . Heart disease Other   . Hyperlipidemia Other   . Stroke Other   . Hypertension Other   . Kidney disease Other   . Diabetes Other     SOCHx:   reports that he has never smoked. He has never used smokeless tobacco. He reports that he drinks about 1.8 - 2.4 oz of alcohol per week. He reports that he does not use illicit drugs.  ALLERGIES:  No Known Allergies  ROS: A comprehensive review of systems was negative except for: Respiratory: positive for dyspnea on exertion Cardiovascular: positive for lower extremity edema Genitourinary: positive for ESRD, anuric  HOME MEDS: Current Outpatient Prescriptions  Medication Sig Dispense Refill  . acetaminophen (TYLENOL) 650 MG CR tablet Take 650 mg by mouth every 8 (eight) hours as needed for pain.    Marland Kitchen allopurinol (ZYLOPRIM) 100 MG tablet Take 1 tablet (100 mg total) by  mouth 2 (two) times daily. 60 tablet 1  . allopurinol (ZYLOPRIM) 100 MG tablet TAKE ONE TABLET BY MOUTH TWICE DAILY 180 tablet 1  . amitriptyline (ELAVIL) 75 MG tablet Take 1 tablet (75 mg total) by mouth at bedtime. 90 tablet 0  . Blood Glucose Monitoring Suppl (ONE TOUCH ULTRA 2) W/DEVICE KIT Use to check blood sugars twice a day Dx E11.9 1 each 0  . carvedilol (COREG) 25 MG tablet Take 25 mg by mouth 2 (two) times daily with a meal. TAKE 1/2 TABLET THE NIGHT BEFORE AND MORNING OF DIALYSIS    . glucose blood (ONE TOUCH ULTRA TEST) test strip 1 each by Other route 2 (two) times daily. Use to check blood sugars twice a day Dx E11.9 100 each 3  . Lancets (ONETOUCH ULTRASOFT) lancets 1 each by Other route 2 (two) times daily. Use to help check blood sugars twice a day Dx  E11.9 100 each 3  . loperamide (IMODIUM A-D) 2 MG tablet Take 2 mg by mouth 4 (four) times daily as needed for diarrhea or loose stools.    . ranitidine (ZANTAC) 150 MG tablet Take 150 mg by mouth daily as needed for heartburn. Reported on 02/03/2015    . sevelamer carbonate (RENVELA) 800 MG tablet Take 1,600 mg by mouth 3 (three) times daily with meals.     . simvastatin (ZOCOR) 20 MG tablet TAKE ONE TABLET BY MOUTH ONCE DAILY 90 tablet 0  . sodium chloride (OCEAN) 0.65 % SOLN nasal spray Place 1 spray into both nostrils as needed for congestion. 104 mL 0  . traMADol (ULTRAM) 50 MG tablet Take 1 tablet (50 mg total) by mouth every 6 (six) hours as needed. 60 tablet 0  . warfarin (COUMADIN) 3 MG tablet Please take as directed by anticoagulation clinic. (Patient taking differently: Take 3-4.5 mg by mouth daily at 6 PM. Takes 4.63m on thu only Takes 355mall other days) 90 tablet 0   No current facility-administered medications for this visit.    LABS/IMAGING: No results found for this or any previous visit (from the past 48 hour(s)). No results found.  VITALS: BP 108/67 mmHg  Pulse 75  Ht 5' 9" (1.753 m)  Wt 247 lb (112.038  kg)  BMI 36.46 kg/m2  EXAM: General appearance: alert, no distress and morbidly obese Neck: no carotid bruit, no JVD and thick Lungs: diminished breath sounds bibasilar Heart: irregularly irregular rhythm Abdomen: soft, non-tender; bowel sounds normal; no masses,  no organomegaly and morbidly obese Extremities: extremities normal, atraumatic, no cyanosis or edema Pulses: 2+ and symmetric Fistula in the left forearm - some surrounding hematoma, faint +thrill Skin: Skin color, texture, turgor normal. No rashes or lesions Neurologic: Grossly normal Psych: Normal mood, affect  EKG: Atrial fibrillation at 75  ASSESSMENT: 1. History of congestive heart failure 2. End-stage renal disease on dialysis 3. Persistent atrial fibrillation - CHADSVASC score of 4 4. Hypertension 5. Dyslipidemia 6. Morbid obesity 7. Pulmonary hypertension 8. DM2 9. Status post cardiac arrest 2 associated with missed dialysis  PLAN: 1.   Mr. LySchramms fortunate to survive to cardiac arrest this past year for missing dialysis. One point his EF was as low as 20% however it has recovered to 55-60%. While he's been on amiodarone it does not seem to be maintaining him in sinus rhythm. Ultimately this may lead to more side effects and problems in the long term. I recommend we discontinue that today. I think we can maintain adequate rate control with carvedilol although he will need to hold his carvedilol dose in the morning of dialysis. The main concern is compliance with dialysis and hopefully he will continue to be compliant. He is on warfarin and we will arrange to follow his INRs in the office and have them drawn at dialysis.  Follow-up with me in 6 months.  KePixie CasinoMD, FATradition Surgery Centerttending Cardiologist [facility] [place] 07/28/2015, 5:23 PM

## 2015-07-29 ENCOUNTER — Telehealth: Payer: Self-pay | Admitting: Pharmacist

## 2015-07-29 DIAGNOSIS — D631 Anemia in chronic kidney disease: Secondary | ICD-10-CM | POA: Diagnosis not present

## 2015-07-29 DIAGNOSIS — Z992 Dependence on renal dialysis: Secondary | ICD-10-CM | POA: Diagnosis not present

## 2015-07-29 DIAGNOSIS — N186 End stage renal disease: Secondary | ICD-10-CM | POA: Diagnosis not present

## 2015-07-29 NOTE — Telephone Encounter (Signed)
Pt of Dr. Debara Pickett wanting to be managed by our coumadin clinic through INR checks at dialysis. He is a patient at [facility] in [place]. I spoke with Gerald Stabs a nurse at the facility and he said they can send INR to our office. I faxed an order for INR to be checked at next dialysis session to [phone] per his direction.   Spoke to patient briefly - he was at dialysis today and wanted me to call him back later today.

## 2015-07-29 NOTE — Telephone Encounter (Signed)
Spoke to patient about being monitored by our office. He appreciates the call. His current Coumadin dose is 4.5mg  (1.5 tablets) each Thursday and 3 mg (1 tablet) all other days. Order sent to be checked on Monday. Instructed him that we would call once we receive INR and dose him appropriately.   Per Gerald Stabs at dialysis they will enter standing order for once monthly check but are able to accommodate more frequent check if needed. We will just need to fax an order.

## 2015-07-30 DIAGNOSIS — R404 Transient alteration of awareness: Secondary | ICD-10-CM | POA: Diagnosis not present

## 2015-07-30 DIAGNOSIS — R531 Weakness: Secondary | ICD-10-CM | POA: Diagnosis not present

## 2015-08-01 DIAGNOSIS — N186 End stage renal disease: Secondary | ICD-10-CM | POA: Diagnosis not present

## 2015-08-01 DIAGNOSIS — Z992 Dependence on renal dialysis: Secondary | ICD-10-CM | POA: Diagnosis not present

## 2015-08-01 DIAGNOSIS — D689 Coagulation defect, unspecified: Secondary | ICD-10-CM | POA: Diagnosis not present

## 2015-08-01 DIAGNOSIS — D631 Anemia in chronic kidney disease: Secondary | ICD-10-CM | POA: Diagnosis not present

## 2015-08-01 LAB — PROTIME-INR: INR: 2.4 — AB (ref 0.9–1.1)

## 2015-08-02 DIAGNOSIS — M25562 Pain in left knee: Secondary | ICD-10-CM | POA: Diagnosis not present

## 2015-08-02 DIAGNOSIS — M25462 Effusion, left knee: Secondary | ICD-10-CM | POA: Diagnosis not present

## 2015-08-02 DIAGNOSIS — M25461 Effusion, right knee: Secondary | ICD-10-CM | POA: Diagnosis not present

## 2015-08-02 DIAGNOSIS — M25561 Pain in right knee: Secondary | ICD-10-CM | POA: Diagnosis not present

## 2015-08-03 ENCOUNTER — Ambulatory Visit (INDEPENDENT_AMBULATORY_CARE_PROVIDER_SITE_OTHER): Payer: Medicare Other | Admitting: Pharmacist Clinician (PhC)/ Clinical Pharmacy Specialist

## 2015-08-03 DIAGNOSIS — N186 End stage renal disease: Secondary | ICD-10-CM | POA: Diagnosis not present

## 2015-08-03 DIAGNOSIS — Z7901 Long term (current) use of anticoagulants: Secondary | ICD-10-CM

## 2015-08-03 DIAGNOSIS — I480 Paroxysmal atrial fibrillation: Secondary | ICD-10-CM

## 2015-08-03 DIAGNOSIS — Z992 Dependence on renal dialysis: Secondary | ICD-10-CM | POA: Diagnosis not present

## 2015-08-03 DIAGNOSIS — D631 Anemia in chronic kidney disease: Secondary | ICD-10-CM | POA: Diagnosis not present

## 2015-08-05 DIAGNOSIS — N186 End stage renal disease: Secondary | ICD-10-CM | POA: Diagnosis not present

## 2015-08-05 DIAGNOSIS — D631 Anemia in chronic kidney disease: Secondary | ICD-10-CM | POA: Diagnosis not present

## 2015-08-05 DIAGNOSIS — Z992 Dependence on renal dialysis: Secondary | ICD-10-CM | POA: Diagnosis not present

## 2015-08-08 DIAGNOSIS — N186 End stage renal disease: Secondary | ICD-10-CM | POA: Diagnosis not present

## 2015-08-10 DIAGNOSIS — N186 End stage renal disease: Secondary | ICD-10-CM | POA: Diagnosis not present

## 2015-08-12 DIAGNOSIS — Z992 Dependence on renal dialysis: Secondary | ICD-10-CM | POA: Diagnosis not present

## 2015-08-12 DIAGNOSIS — I120 Hypertensive chronic kidney disease with stage 5 chronic kidney disease or end stage renal disease: Secondary | ICD-10-CM | POA: Diagnosis not present

## 2015-08-12 DIAGNOSIS — N186 End stage renal disease: Secondary | ICD-10-CM | POA: Diagnosis not present

## 2015-08-15 DIAGNOSIS — M1711 Unilateral primary osteoarthritis, right knee: Secondary | ICD-10-CM | POA: Diagnosis not present

## 2015-08-15 DIAGNOSIS — M25462 Effusion, left knee: Secondary | ICD-10-CM | POA: Diagnosis not present

## 2015-08-15 DIAGNOSIS — M25461 Effusion, right knee: Secondary | ICD-10-CM | POA: Diagnosis not present

## 2015-08-15 DIAGNOSIS — N186 End stage renal disease: Secondary | ICD-10-CM | POA: Diagnosis not present

## 2015-08-15 DIAGNOSIS — D631 Anemia in chronic kidney disease: Secondary | ICD-10-CM | POA: Diagnosis not present

## 2015-08-15 DIAGNOSIS — I4891 Unspecified atrial fibrillation: Secondary | ICD-10-CM | POA: Diagnosis not present

## 2015-08-15 DIAGNOSIS — M1712 Unilateral primary osteoarthritis, left knee: Secondary | ICD-10-CM | POA: Diagnosis not present

## 2015-08-15 LAB — PROTIME-INR: INR: 2.8 — AB (ref 0.9–1.1)

## 2015-08-17 ENCOUNTER — Ambulatory Visit (INDEPENDENT_AMBULATORY_CARE_PROVIDER_SITE_OTHER): Payer: Medicare Other | Admitting: Pharmacist

## 2015-08-17 DIAGNOSIS — I480 Paroxysmal atrial fibrillation: Secondary | ICD-10-CM

## 2015-08-17 DIAGNOSIS — N186 End stage renal disease: Secondary | ICD-10-CM | POA: Diagnosis not present

## 2015-08-17 DIAGNOSIS — Z7901 Long term (current) use of anticoagulants: Secondary | ICD-10-CM

## 2015-08-17 DIAGNOSIS — D631 Anemia in chronic kidney disease: Secondary | ICD-10-CM | POA: Diagnosis not present

## 2015-08-19 DIAGNOSIS — N186 End stage renal disease: Secondary | ICD-10-CM | POA: Diagnosis not present

## 2015-08-19 DIAGNOSIS — D631 Anemia in chronic kidney disease: Secondary | ICD-10-CM | POA: Diagnosis not present

## 2015-08-22 DIAGNOSIS — M25461 Effusion, right knee: Secondary | ICD-10-CM | POA: Diagnosis not present

## 2015-08-22 DIAGNOSIS — D631 Anemia in chronic kidney disease: Secondary | ICD-10-CM | POA: Diagnosis not present

## 2015-08-22 DIAGNOSIS — N186 End stage renal disease: Secondary | ICD-10-CM | POA: Diagnosis not present

## 2015-08-22 DIAGNOSIS — M25462 Effusion, left knee: Secondary | ICD-10-CM | POA: Diagnosis not present

## 2015-08-22 DIAGNOSIS — M1711 Unilateral primary osteoarthritis, right knee: Secondary | ICD-10-CM | POA: Diagnosis not present

## 2015-08-22 DIAGNOSIS — M1712 Unilateral primary osteoarthritis, left knee: Secondary | ICD-10-CM | POA: Diagnosis not present

## 2015-08-24 DIAGNOSIS — N186 End stage renal disease: Secondary | ICD-10-CM | POA: Diagnosis not present

## 2015-08-24 DIAGNOSIS — D631 Anemia in chronic kidney disease: Secondary | ICD-10-CM | POA: Diagnosis not present

## 2015-08-26 DIAGNOSIS — M25461 Effusion, right knee: Secondary | ICD-10-CM | POA: Diagnosis not present

## 2015-08-26 DIAGNOSIS — M25561 Pain in right knee: Secondary | ICD-10-CM | POA: Diagnosis not present

## 2015-08-29 DIAGNOSIS — M25462 Effusion, left knee: Secondary | ICD-10-CM | POA: Diagnosis not present

## 2015-08-29 DIAGNOSIS — D631 Anemia in chronic kidney disease: Secondary | ICD-10-CM | POA: Diagnosis not present

## 2015-08-29 DIAGNOSIS — M25461 Effusion, right knee: Secondary | ICD-10-CM | POA: Diagnosis not present

## 2015-08-29 DIAGNOSIS — N186 End stage renal disease: Secondary | ICD-10-CM | POA: Diagnosis not present

## 2015-08-29 DIAGNOSIS — M1711 Unilateral primary osteoarthritis, right knee: Secondary | ICD-10-CM | POA: Diagnosis not present

## 2015-08-29 DIAGNOSIS — M1712 Unilateral primary osteoarthritis, left knee: Secondary | ICD-10-CM | POA: Diagnosis not present

## 2015-08-31 DIAGNOSIS — N186 End stage renal disease: Secondary | ICD-10-CM | POA: Diagnosis not present

## 2015-08-31 DIAGNOSIS — D631 Anemia in chronic kidney disease: Secondary | ICD-10-CM | POA: Diagnosis not present

## 2015-09-02 DIAGNOSIS — D631 Anemia in chronic kidney disease: Secondary | ICD-10-CM | POA: Diagnosis not present

## 2015-09-02 DIAGNOSIS — N186 End stage renal disease: Secondary | ICD-10-CM | POA: Diagnosis not present

## 2015-09-05 DIAGNOSIS — D631 Anemia in chronic kidney disease: Secondary | ICD-10-CM | POA: Diagnosis not present

## 2015-09-05 DIAGNOSIS — N186 End stage renal disease: Secondary | ICD-10-CM | POA: Diagnosis not present

## 2015-09-06 DIAGNOSIS — M1711 Unilateral primary osteoarthritis, right knee: Secondary | ICD-10-CM | POA: Diagnosis not present

## 2015-09-06 DIAGNOSIS — M1712 Unilateral primary osteoarthritis, left knee: Secondary | ICD-10-CM | POA: Diagnosis not present

## 2015-09-07 DIAGNOSIS — I4891 Unspecified atrial fibrillation: Secondary | ICD-10-CM | POA: Diagnosis not present

## 2015-09-07 DIAGNOSIS — D631 Anemia in chronic kidney disease: Secondary | ICD-10-CM | POA: Diagnosis not present

## 2015-09-07 DIAGNOSIS — N186 End stage renal disease: Secondary | ICD-10-CM | POA: Diagnosis not present

## 2015-09-07 LAB — PROTIME-INR: INR: 1.6 — AB (ref 0.9–1.1)

## 2015-09-09 ENCOUNTER — Ambulatory Visit (INDEPENDENT_AMBULATORY_CARE_PROVIDER_SITE_OTHER): Payer: Medicare Other | Admitting: Pharmacist

## 2015-09-09 DIAGNOSIS — N186 End stage renal disease: Secondary | ICD-10-CM | POA: Diagnosis not present

## 2015-09-09 DIAGNOSIS — I480 Paroxysmal atrial fibrillation: Secondary | ICD-10-CM

## 2015-09-09 DIAGNOSIS — Z7901 Long term (current) use of anticoagulants: Secondary | ICD-10-CM

## 2015-09-09 DIAGNOSIS — D631 Anemia in chronic kidney disease: Secondary | ICD-10-CM | POA: Diagnosis not present

## 2015-09-12 DIAGNOSIS — N186 End stage renal disease: Secondary | ICD-10-CM | POA: Diagnosis not present

## 2015-09-12 DIAGNOSIS — Z992 Dependence on renal dialysis: Secondary | ICD-10-CM | POA: Diagnosis not present

## 2015-09-12 DIAGNOSIS — I120 Hypertensive chronic kidney disease with stage 5 chronic kidney disease or end stage renal disease: Secondary | ICD-10-CM | POA: Diagnosis not present

## 2015-09-13 ENCOUNTER — Telehealth: Payer: Self-pay

## 2015-09-13 DIAGNOSIS — E1129 Type 2 diabetes mellitus with other diabetic kidney complication: Secondary | ICD-10-CM | POA: Diagnosis not present

## 2015-09-13 DIAGNOSIS — D631 Anemia in chronic kidney disease: Secondary | ICD-10-CM | POA: Diagnosis not present

## 2015-09-13 DIAGNOSIS — N186 End stage renal disease: Secondary | ICD-10-CM | POA: Diagnosis not present

## 2015-09-13 DIAGNOSIS — N2581 Secondary hyperparathyroidism of renal origin: Secondary | ICD-10-CM | POA: Diagnosis not present

## 2015-09-13 NOTE — Telephone Encounter (Signed)
1. Type of surgery: Right Total Knee Arthroplasty 2. Date of surgery: Undetermine 3. Surgeon: Dr. Dorna Leitz 4. Medications that need to be held & how long: Warfarin 5. Fax and/or Phone:  Phone:[phone] Fax: [phone]

## 2015-09-13 NOTE — Telephone Encounter (Signed)
Routed to Dr Berenice Primas via fax

## 2015-09-13 NOTE — Telephone Encounter (Signed)
Agree .. Thanks. Acceptable risk for surgery.  Dr. Lemmie Evens

## 2015-09-13 NOTE — Telephone Encounter (Signed)
Patient with CHADS2 of 5, CHADSVASC of 6 and prior stroke in 2005. He is on dialysis.   After consultation with Dr. Debara Pickett will hold warfarin for 5 days prior to surgery and bridge with Lovenox 100mg  once daily discontinued 36 hours prior to procedure.   Resume warfarin the evening of procedure or as directed by doctor. Recheck INR 7-10 days after procedure.

## 2015-09-14 DIAGNOSIS — N186 End stage renal disease: Secondary | ICD-10-CM | POA: Diagnosis not present

## 2015-09-14 DIAGNOSIS — E1129 Type 2 diabetes mellitus with other diabetic kidney complication: Secondary | ICD-10-CM | POA: Diagnosis not present

## 2015-09-14 DIAGNOSIS — N2581 Secondary hyperparathyroidism of renal origin: Secondary | ICD-10-CM | POA: Diagnosis not present

## 2015-09-14 DIAGNOSIS — D631 Anemia in chronic kidney disease: Secondary | ICD-10-CM | POA: Diagnosis not present

## 2015-09-15 ENCOUNTER — Telehealth: Payer: Self-pay | Admitting: Pharmacist

## 2015-09-15 NOTE — Telephone Encounter (Signed)
LMTCB about lovenox bridge prior to procedure

## 2015-09-16 DIAGNOSIS — D631 Anemia in chronic kidney disease: Secondary | ICD-10-CM | POA: Diagnosis not present

## 2015-09-16 DIAGNOSIS — E1129 Type 2 diabetes mellitus with other diabetic kidney complication: Secondary | ICD-10-CM | POA: Diagnosis not present

## 2015-09-16 DIAGNOSIS — N2581 Secondary hyperparathyroidism of renal origin: Secondary | ICD-10-CM | POA: Diagnosis not present

## 2015-09-16 DIAGNOSIS — N186 End stage renal disease: Secondary | ICD-10-CM | POA: Diagnosis not present

## 2015-09-19 DIAGNOSIS — E1129 Type 2 diabetes mellitus with other diabetic kidney complication: Secondary | ICD-10-CM | POA: Diagnosis not present

## 2015-09-19 DIAGNOSIS — D631 Anemia in chronic kidney disease: Secondary | ICD-10-CM | POA: Diagnosis not present

## 2015-09-19 DIAGNOSIS — N186 End stage renal disease: Secondary | ICD-10-CM | POA: Diagnosis not present

## 2015-09-19 DIAGNOSIS — N2581 Secondary hyperparathyroidism of renal origin: Secondary | ICD-10-CM | POA: Diagnosis not present

## 2015-09-21 DIAGNOSIS — N2581 Secondary hyperparathyroidism of renal origin: Secondary | ICD-10-CM | POA: Diagnosis not present

## 2015-09-21 DIAGNOSIS — N186 End stage renal disease: Secondary | ICD-10-CM | POA: Diagnosis not present

## 2015-09-21 DIAGNOSIS — E1129 Type 2 diabetes mellitus with other diabetic kidney complication: Secondary | ICD-10-CM | POA: Diagnosis not present

## 2015-09-21 DIAGNOSIS — D631 Anemia in chronic kidney disease: Secondary | ICD-10-CM | POA: Diagnosis not present

## 2015-09-21 DIAGNOSIS — I4891 Unspecified atrial fibrillation: Secondary | ICD-10-CM | POA: Diagnosis not present

## 2015-09-22 LAB — PROTIME-INR: INR: 2.5 — AB (ref 0.9–1.1)

## 2015-09-23 ENCOUNTER — Ambulatory Visit (INDEPENDENT_AMBULATORY_CARE_PROVIDER_SITE_OTHER): Payer: Medicare Other | Admitting: Pharmacist

## 2015-09-23 DIAGNOSIS — I480 Paroxysmal atrial fibrillation: Secondary | ICD-10-CM

## 2015-09-23 DIAGNOSIS — N186 End stage renal disease: Secondary | ICD-10-CM | POA: Diagnosis not present

## 2015-09-23 DIAGNOSIS — N2581 Secondary hyperparathyroidism of renal origin: Secondary | ICD-10-CM | POA: Diagnosis not present

## 2015-09-23 DIAGNOSIS — Z7901 Long term (current) use of anticoagulants: Secondary | ICD-10-CM

## 2015-09-26 DIAGNOSIS — N186 End stage renal disease: Secondary | ICD-10-CM | POA: Diagnosis not present

## 2015-09-26 DIAGNOSIS — N2581 Secondary hyperparathyroidism of renal origin: Secondary | ICD-10-CM | POA: Diagnosis not present

## 2015-09-28 DIAGNOSIS — N186 End stage renal disease: Secondary | ICD-10-CM | POA: Diagnosis not present

## 2015-09-28 DIAGNOSIS — N2581 Secondary hyperparathyroidism of renal origin: Secondary | ICD-10-CM | POA: Diagnosis not present

## 2015-09-30 DIAGNOSIS — N186 End stage renal disease: Secondary | ICD-10-CM | POA: Diagnosis not present

## 2015-09-30 DIAGNOSIS — D631 Anemia in chronic kidney disease: Secondary | ICD-10-CM | POA: Diagnosis not present

## 2015-09-30 DIAGNOSIS — E1129 Type 2 diabetes mellitus with other diabetic kidney complication: Secondary | ICD-10-CM | POA: Diagnosis not present

## 2015-09-30 DIAGNOSIS — N2581 Secondary hyperparathyroidism of renal origin: Secondary | ICD-10-CM | POA: Diagnosis not present

## 2015-10-03 DIAGNOSIS — N186 End stage renal disease: Secondary | ICD-10-CM | POA: Diagnosis not present

## 2015-10-03 DIAGNOSIS — E1129 Type 2 diabetes mellitus with other diabetic kidney complication: Secondary | ICD-10-CM | POA: Diagnosis not present

## 2015-10-03 DIAGNOSIS — D631 Anemia in chronic kidney disease: Secondary | ICD-10-CM | POA: Diagnosis not present

## 2015-10-03 DIAGNOSIS — N2581 Secondary hyperparathyroidism of renal origin: Secondary | ICD-10-CM | POA: Diagnosis not present

## 2015-10-04 DIAGNOSIS — Z992 Dependence on renal dialysis: Secondary | ICD-10-CM | POA: Diagnosis not present

## 2015-10-04 DIAGNOSIS — T82858D Stenosis of vascular prosthetic devices, implants and grafts, subsequent encounter: Secondary | ICD-10-CM | POA: Diagnosis not present

## 2015-10-04 DIAGNOSIS — I871 Compression of vein: Secondary | ICD-10-CM | POA: Diagnosis not present

## 2015-10-04 DIAGNOSIS — N186 End stage renal disease: Secondary | ICD-10-CM | POA: Diagnosis not present

## 2015-10-05 DIAGNOSIS — E1129 Type 2 diabetes mellitus with other diabetic kidney complication: Secondary | ICD-10-CM | POA: Diagnosis not present

## 2015-10-05 DIAGNOSIS — D631 Anemia in chronic kidney disease: Secondary | ICD-10-CM | POA: Diagnosis not present

## 2015-10-05 DIAGNOSIS — N186 End stage renal disease: Secondary | ICD-10-CM | POA: Diagnosis not present

## 2015-10-05 DIAGNOSIS — I4891 Unspecified atrial fibrillation: Secondary | ICD-10-CM | POA: Diagnosis not present

## 2015-10-05 DIAGNOSIS — N2581 Secondary hyperparathyroidism of renal origin: Secondary | ICD-10-CM | POA: Diagnosis not present

## 2015-10-06 LAB — PROTIME-INR: INR: 4.4 — AB (ref 0.9–1.1)

## 2015-10-07 ENCOUNTER — Ambulatory Visit (INDEPENDENT_AMBULATORY_CARE_PROVIDER_SITE_OTHER): Payer: Medicare Other | Admitting: Pharmacist Clinician (PhC)/ Clinical Pharmacy Specialist

## 2015-10-07 DIAGNOSIS — D631 Anemia in chronic kidney disease: Secondary | ICD-10-CM | POA: Diagnosis not present

## 2015-10-07 DIAGNOSIS — Z7901 Long term (current) use of anticoagulants: Secondary | ICD-10-CM

## 2015-10-07 DIAGNOSIS — E1129 Type 2 diabetes mellitus with other diabetic kidney complication: Secondary | ICD-10-CM | POA: Diagnosis not present

## 2015-10-07 DIAGNOSIS — N186 End stage renal disease: Secondary | ICD-10-CM | POA: Diagnosis not present

## 2015-10-07 DIAGNOSIS — I480 Paroxysmal atrial fibrillation: Secondary | ICD-10-CM

## 2015-10-07 DIAGNOSIS — N2581 Secondary hyperparathyroidism of renal origin: Secondary | ICD-10-CM | POA: Diagnosis not present

## 2015-10-10 DIAGNOSIS — D631 Anemia in chronic kidney disease: Secondary | ICD-10-CM | POA: Diagnosis not present

## 2015-10-10 DIAGNOSIS — N2581 Secondary hyperparathyroidism of renal origin: Secondary | ICD-10-CM | POA: Diagnosis not present

## 2015-10-10 DIAGNOSIS — N186 End stage renal disease: Secondary | ICD-10-CM | POA: Diagnosis not present

## 2015-10-10 DIAGNOSIS — E1129 Type 2 diabetes mellitus with other diabetic kidney complication: Secondary | ICD-10-CM | POA: Diagnosis not present

## 2015-10-12 DIAGNOSIS — N186 End stage renal disease: Secondary | ICD-10-CM | POA: Diagnosis not present

## 2015-10-12 DIAGNOSIS — D631 Anemia in chronic kidney disease: Secondary | ICD-10-CM | POA: Diagnosis not present

## 2015-10-12 DIAGNOSIS — N2581 Secondary hyperparathyroidism of renal origin: Secondary | ICD-10-CM | POA: Diagnosis not present

## 2015-10-12 DIAGNOSIS — E1129 Type 2 diabetes mellitus with other diabetic kidney complication: Secondary | ICD-10-CM | POA: Diagnosis not present

## 2015-10-13 DIAGNOSIS — N186 End stage renal disease: Secondary | ICD-10-CM | POA: Diagnosis not present

## 2015-10-13 DIAGNOSIS — Z992 Dependence on renal dialysis: Secondary | ICD-10-CM | POA: Diagnosis not present

## 2015-10-13 DIAGNOSIS — I120 Hypertensive chronic kidney disease with stage 5 chronic kidney disease or end stage renal disease: Secondary | ICD-10-CM | POA: Diagnosis not present

## 2015-10-14 DIAGNOSIS — D631 Anemia in chronic kidney disease: Secondary | ICD-10-CM | POA: Diagnosis not present

## 2015-10-14 DIAGNOSIS — Z992 Dependence on renal dialysis: Secondary | ICD-10-CM | POA: Diagnosis not present

## 2015-10-14 DIAGNOSIS — E1129 Type 2 diabetes mellitus with other diabetic kidney complication: Secondary | ICD-10-CM | POA: Diagnosis not present

## 2015-10-14 DIAGNOSIS — N2581 Secondary hyperparathyroidism of renal origin: Secondary | ICD-10-CM | POA: Diagnosis not present

## 2015-10-14 DIAGNOSIS — N186 End stage renal disease: Secondary | ICD-10-CM | POA: Diagnosis not present

## 2015-10-17 DIAGNOSIS — E1129 Type 2 diabetes mellitus with other diabetic kidney complication: Secondary | ICD-10-CM | POA: Diagnosis not present

## 2015-10-17 DIAGNOSIS — N186 End stage renal disease: Secondary | ICD-10-CM | POA: Diagnosis not present

## 2015-10-17 DIAGNOSIS — N2581 Secondary hyperparathyroidism of renal origin: Secondary | ICD-10-CM | POA: Diagnosis not present

## 2015-10-17 DIAGNOSIS — Z992 Dependence on renal dialysis: Secondary | ICD-10-CM | POA: Diagnosis not present

## 2015-10-17 DIAGNOSIS — D631 Anemia in chronic kidney disease: Secondary | ICD-10-CM | POA: Diagnosis not present

## 2015-10-19 DIAGNOSIS — Z992 Dependence on renal dialysis: Secondary | ICD-10-CM | POA: Diagnosis not present

## 2015-10-19 DIAGNOSIS — N2581 Secondary hyperparathyroidism of renal origin: Secondary | ICD-10-CM | POA: Diagnosis not present

## 2015-10-19 DIAGNOSIS — N186 End stage renal disease: Secondary | ICD-10-CM | POA: Diagnosis not present

## 2015-10-19 DIAGNOSIS — E1129 Type 2 diabetes mellitus with other diabetic kidney complication: Secondary | ICD-10-CM | POA: Diagnosis not present

## 2015-10-19 DIAGNOSIS — D631 Anemia in chronic kidney disease: Secondary | ICD-10-CM | POA: Diagnosis not present

## 2015-10-19 DIAGNOSIS — D689 Coagulation defect, unspecified: Secondary | ICD-10-CM | POA: Diagnosis not present

## 2015-10-19 LAB — PROTIME-INR: INR: 1.7 — AB (ref 0.9–1.1)

## 2015-10-21 ENCOUNTER — Ambulatory Visit (INDEPENDENT_AMBULATORY_CARE_PROVIDER_SITE_OTHER): Payer: Medicare Other | Admitting: Pharmacist Clinician (PhC)/ Clinical Pharmacy Specialist

## 2015-10-21 DIAGNOSIS — Z992 Dependence on renal dialysis: Secondary | ICD-10-CM | POA: Diagnosis not present

## 2015-10-21 DIAGNOSIS — N186 End stage renal disease: Secondary | ICD-10-CM | POA: Diagnosis not present

## 2015-10-21 DIAGNOSIS — I480 Paroxysmal atrial fibrillation: Secondary | ICD-10-CM

## 2015-10-21 DIAGNOSIS — E1129 Type 2 diabetes mellitus with other diabetic kidney complication: Secondary | ICD-10-CM | POA: Diagnosis not present

## 2015-10-21 DIAGNOSIS — N2581 Secondary hyperparathyroidism of renal origin: Secondary | ICD-10-CM | POA: Diagnosis not present

## 2015-10-21 DIAGNOSIS — Z7901 Long term (current) use of anticoagulants: Secondary | ICD-10-CM

## 2015-10-21 DIAGNOSIS — D631 Anemia in chronic kidney disease: Secondary | ICD-10-CM | POA: Diagnosis not present

## 2015-10-24 DIAGNOSIS — Z992 Dependence on renal dialysis: Secondary | ICD-10-CM | POA: Diagnosis not present

## 2015-10-24 DIAGNOSIS — N186 End stage renal disease: Secondary | ICD-10-CM | POA: Diagnosis not present

## 2015-10-24 DIAGNOSIS — E1129 Type 2 diabetes mellitus with other diabetic kidney complication: Secondary | ICD-10-CM | POA: Diagnosis not present

## 2015-10-24 DIAGNOSIS — D631 Anemia in chronic kidney disease: Secondary | ICD-10-CM | POA: Diagnosis not present

## 2015-10-24 DIAGNOSIS — N2581 Secondary hyperparathyroidism of renal origin: Secondary | ICD-10-CM | POA: Diagnosis not present

## 2015-10-26 DIAGNOSIS — N2581 Secondary hyperparathyroidism of renal origin: Secondary | ICD-10-CM | POA: Diagnosis not present

## 2015-10-26 DIAGNOSIS — E1129 Type 2 diabetes mellitus with other diabetic kidney complication: Secondary | ICD-10-CM | POA: Diagnosis not present

## 2015-10-26 DIAGNOSIS — D631 Anemia in chronic kidney disease: Secondary | ICD-10-CM | POA: Diagnosis not present

## 2015-10-26 DIAGNOSIS — N186 End stage renal disease: Secondary | ICD-10-CM | POA: Diagnosis not present

## 2015-10-26 DIAGNOSIS — Z992 Dependence on renal dialysis: Secondary | ICD-10-CM | POA: Diagnosis not present

## 2015-10-28 DIAGNOSIS — Z992 Dependence on renal dialysis: Secondary | ICD-10-CM | POA: Diagnosis not present

## 2015-10-28 DIAGNOSIS — N2581 Secondary hyperparathyroidism of renal origin: Secondary | ICD-10-CM | POA: Diagnosis not present

## 2015-10-28 DIAGNOSIS — D631 Anemia in chronic kidney disease: Secondary | ICD-10-CM | POA: Diagnosis not present

## 2015-10-28 DIAGNOSIS — N186 End stage renal disease: Secondary | ICD-10-CM | POA: Diagnosis not present

## 2015-10-28 DIAGNOSIS — E1129 Type 2 diabetes mellitus with other diabetic kidney complication: Secondary | ICD-10-CM | POA: Diagnosis not present

## 2015-10-31 DIAGNOSIS — E1129 Type 2 diabetes mellitus with other diabetic kidney complication: Secondary | ICD-10-CM | POA: Diagnosis not present

## 2015-10-31 DIAGNOSIS — N2581 Secondary hyperparathyroidism of renal origin: Secondary | ICD-10-CM | POA: Diagnosis not present

## 2015-10-31 DIAGNOSIS — D631 Anemia in chronic kidney disease: Secondary | ICD-10-CM | POA: Diagnosis not present

## 2015-10-31 DIAGNOSIS — Z992 Dependence on renal dialysis: Secondary | ICD-10-CM | POA: Diagnosis not present

## 2015-10-31 DIAGNOSIS — N186 End stage renal disease: Secondary | ICD-10-CM | POA: Diagnosis not present

## 2015-11-02 DIAGNOSIS — D631 Anemia in chronic kidney disease: Secondary | ICD-10-CM | POA: Diagnosis not present

## 2015-11-02 DIAGNOSIS — N186 End stage renal disease: Secondary | ICD-10-CM | POA: Diagnosis not present

## 2015-11-02 DIAGNOSIS — N2581 Secondary hyperparathyroidism of renal origin: Secondary | ICD-10-CM | POA: Diagnosis not present

## 2015-11-02 DIAGNOSIS — Z992 Dependence on renal dialysis: Secondary | ICD-10-CM | POA: Diagnosis not present

## 2015-11-02 DIAGNOSIS — E1129 Type 2 diabetes mellitus with other diabetic kidney complication: Secondary | ICD-10-CM | POA: Diagnosis not present

## 2015-11-03 DIAGNOSIS — I5020 Unspecified systolic (congestive) heart failure: Secondary | ICD-10-CM | POA: Diagnosis not present

## 2015-11-03 DIAGNOSIS — R7303 Prediabetes: Secondary | ICD-10-CM | POA: Diagnosis not present

## 2015-11-03 DIAGNOSIS — Z01818 Encounter for other preprocedural examination: Secondary | ICD-10-CM | POA: Diagnosis not present

## 2015-11-03 DIAGNOSIS — Z0001 Encounter for general adult medical examination with abnormal findings: Secondary | ICD-10-CM | POA: Diagnosis not present

## 2015-11-03 DIAGNOSIS — I10 Essential (primary) hypertension: Secondary | ICD-10-CM | POA: Diagnosis not present

## 2015-11-03 DIAGNOSIS — Z125 Encounter for screening for malignant neoplasm of prostate: Secondary | ICD-10-CM | POA: Diagnosis not present

## 2015-11-03 DIAGNOSIS — R209 Unspecified disturbances of skin sensation: Secondary | ICD-10-CM | POA: Diagnosis not present

## 2015-11-03 DIAGNOSIS — N186 End stage renal disease: Secondary | ICD-10-CM | POA: Diagnosis not present

## 2015-11-03 DIAGNOSIS — Z23 Encounter for immunization: Secondary | ICD-10-CM | POA: Diagnosis not present

## 2015-11-03 DIAGNOSIS — E785 Hyperlipidemia, unspecified: Secondary | ICD-10-CM | POA: Diagnosis not present

## 2015-11-03 DIAGNOSIS — Z79899 Other long term (current) drug therapy: Secondary | ICD-10-CM | POA: Diagnosis not present

## 2015-11-03 DIAGNOSIS — I482 Chronic atrial fibrillation: Secondary | ICD-10-CM | POA: Diagnosis not present

## 2015-11-03 DIAGNOSIS — M170 Bilateral primary osteoarthritis of knee: Secondary | ICD-10-CM | POA: Diagnosis not present

## 2015-11-03 DIAGNOSIS — R413 Other amnesia: Secondary | ICD-10-CM | POA: Diagnosis not present

## 2015-11-04 DIAGNOSIS — N186 End stage renal disease: Secondary | ICD-10-CM | POA: Diagnosis not present

## 2015-11-04 DIAGNOSIS — D631 Anemia in chronic kidney disease: Secondary | ICD-10-CM | POA: Diagnosis not present

## 2015-11-04 DIAGNOSIS — E1129 Type 2 diabetes mellitus with other diabetic kidney complication: Secondary | ICD-10-CM | POA: Diagnosis not present

## 2015-11-04 DIAGNOSIS — N2581 Secondary hyperparathyroidism of renal origin: Secondary | ICD-10-CM | POA: Diagnosis not present

## 2015-11-04 DIAGNOSIS — Z992 Dependence on renal dialysis: Secondary | ICD-10-CM | POA: Diagnosis not present

## 2015-11-07 DIAGNOSIS — D631 Anemia in chronic kidney disease: Secondary | ICD-10-CM | POA: Diagnosis not present

## 2015-11-07 DIAGNOSIS — N2581 Secondary hyperparathyroidism of renal origin: Secondary | ICD-10-CM | POA: Diagnosis not present

## 2015-11-07 DIAGNOSIS — N186 End stage renal disease: Secondary | ICD-10-CM | POA: Diagnosis not present

## 2015-11-07 DIAGNOSIS — Z992 Dependence on renal dialysis: Secondary | ICD-10-CM | POA: Diagnosis not present

## 2015-11-07 DIAGNOSIS — E1129 Type 2 diabetes mellitus with other diabetic kidney complication: Secondary | ICD-10-CM | POA: Diagnosis not present

## 2015-11-09 DIAGNOSIS — N186 End stage renal disease: Secondary | ICD-10-CM | POA: Diagnosis not present

## 2015-11-09 DIAGNOSIS — D631 Anemia in chronic kidney disease: Secondary | ICD-10-CM | POA: Diagnosis not present

## 2015-11-09 DIAGNOSIS — Z992 Dependence on renal dialysis: Secondary | ICD-10-CM | POA: Diagnosis not present

## 2015-11-09 DIAGNOSIS — I4891 Unspecified atrial fibrillation: Secondary | ICD-10-CM | POA: Diagnosis not present

## 2015-11-09 DIAGNOSIS — N2581 Secondary hyperparathyroidism of renal origin: Secondary | ICD-10-CM | POA: Diagnosis not present

## 2015-11-09 DIAGNOSIS — E1129 Type 2 diabetes mellitus with other diabetic kidney complication: Secondary | ICD-10-CM | POA: Diagnosis not present

## 2015-11-09 LAB — PROTIME-INR: INR: 2.2 — AB (ref 0.9–1.1)

## 2015-11-11 DIAGNOSIS — N186 End stage renal disease: Secondary | ICD-10-CM | POA: Diagnosis not present

## 2015-11-11 DIAGNOSIS — N2581 Secondary hyperparathyroidism of renal origin: Secondary | ICD-10-CM | POA: Diagnosis not present

## 2015-11-12 DIAGNOSIS — Z992 Dependence on renal dialysis: Secondary | ICD-10-CM | POA: Diagnosis not present

## 2015-11-12 DIAGNOSIS — N186 End stage renal disease: Secondary | ICD-10-CM | POA: Diagnosis not present

## 2015-11-12 DIAGNOSIS — I120 Hypertensive chronic kidney disease with stage 5 chronic kidney disease or end stage renal disease: Secondary | ICD-10-CM | POA: Diagnosis not present

## 2015-11-14 ENCOUNTER — Ambulatory Visit (INDEPENDENT_AMBULATORY_CARE_PROVIDER_SITE_OTHER): Payer: Medicare Other | Admitting: Pharmacist Clinician (PhC)/ Clinical Pharmacy Specialist

## 2015-11-14 DIAGNOSIS — N186 End stage renal disease: Secondary | ICD-10-CM | POA: Diagnosis not present

## 2015-11-14 DIAGNOSIS — N2581 Secondary hyperparathyroidism of renal origin: Secondary | ICD-10-CM | POA: Diagnosis not present

## 2015-11-14 DIAGNOSIS — I480 Paroxysmal atrial fibrillation: Secondary | ICD-10-CM

## 2015-11-16 DIAGNOSIS — N186 End stage renal disease: Secondary | ICD-10-CM | POA: Diagnosis not present

## 2015-11-16 DIAGNOSIS — D631 Anemia in chronic kidney disease: Secondary | ICD-10-CM | POA: Diagnosis not present

## 2015-11-16 DIAGNOSIS — N2581 Secondary hyperparathyroidism of renal origin: Secondary | ICD-10-CM | POA: Diagnosis not present

## 2015-11-18 DIAGNOSIS — N186 End stage renal disease: Secondary | ICD-10-CM | POA: Diagnosis not present

## 2015-11-18 DIAGNOSIS — D631 Anemia in chronic kidney disease: Secondary | ICD-10-CM | POA: Diagnosis not present

## 2015-11-18 DIAGNOSIS — N2581 Secondary hyperparathyroidism of renal origin: Secondary | ICD-10-CM | POA: Diagnosis not present

## 2015-11-21 DIAGNOSIS — N186 End stage renal disease: Secondary | ICD-10-CM | POA: Diagnosis not present

## 2015-11-21 DIAGNOSIS — N2581 Secondary hyperparathyroidism of renal origin: Secondary | ICD-10-CM | POA: Diagnosis not present

## 2015-11-21 DIAGNOSIS — D631 Anemia in chronic kidney disease: Secondary | ICD-10-CM | POA: Diagnosis not present

## 2015-11-23 DIAGNOSIS — D631 Anemia in chronic kidney disease: Secondary | ICD-10-CM | POA: Diagnosis not present

## 2015-11-23 DIAGNOSIS — I4891 Unspecified atrial fibrillation: Secondary | ICD-10-CM | POA: Diagnosis not present

## 2015-11-23 DIAGNOSIS — N186 End stage renal disease: Secondary | ICD-10-CM | POA: Diagnosis not present

## 2015-11-23 DIAGNOSIS — N2581 Secondary hyperparathyroidism of renal origin: Secondary | ICD-10-CM | POA: Diagnosis not present

## 2015-11-23 LAB — PROTIME-INR: INR: 2.9 — AB (ref 0.9–1.1)

## 2015-11-24 ENCOUNTER — Other Ambulatory Visit: Payer: Self-pay | Admitting: Family Medicine

## 2015-11-24 ENCOUNTER — Ambulatory Visit
Admission: RE | Admit: 2015-11-24 | Discharge: 2015-11-24 | Disposition: A | Discharge status: Home / Self Care | Payer: Medicare Other | Source: Ambulatory Visit | Attending: Family Medicine | Admitting: Family Medicine

## 2015-11-24 DIAGNOSIS — Z01818 Encounter for other preprocedural examination: Secondary | ICD-10-CM

## 2015-11-25 DIAGNOSIS — N186 End stage renal disease: Secondary | ICD-10-CM | POA: Diagnosis not present

## 2015-11-25 DIAGNOSIS — D631 Anemia in chronic kidney disease: Secondary | ICD-10-CM | POA: Diagnosis not present

## 2015-11-25 DIAGNOSIS — N2581 Secondary hyperparathyroidism of renal origin: Secondary | ICD-10-CM | POA: Diagnosis not present

## 2015-11-28 ENCOUNTER — Ambulatory Visit (INDEPENDENT_AMBULATORY_CARE_PROVIDER_SITE_OTHER): Payer: Medicare Other | Admitting: Pharmacist

## 2015-11-28 DIAGNOSIS — N2581 Secondary hyperparathyroidism of renal origin: Secondary | ICD-10-CM | POA: Diagnosis not present

## 2015-11-28 DIAGNOSIS — N186 End stage renal disease: Secondary | ICD-10-CM | POA: Diagnosis not present

## 2015-11-28 DIAGNOSIS — D631 Anemia in chronic kidney disease: Secondary | ICD-10-CM | POA: Diagnosis not present

## 2015-11-28 DIAGNOSIS — I480 Paroxysmal atrial fibrillation: Secondary | ICD-10-CM

## 2015-11-30 DIAGNOSIS — N2581 Secondary hyperparathyroidism of renal origin: Secondary | ICD-10-CM | POA: Diagnosis not present

## 2015-11-30 DIAGNOSIS — D631 Anemia in chronic kidney disease: Secondary | ICD-10-CM | POA: Diagnosis not present

## 2015-11-30 DIAGNOSIS — N186 End stage renal disease: Secondary | ICD-10-CM | POA: Diagnosis not present

## 2015-12-02 DIAGNOSIS — N2581 Secondary hyperparathyroidism of renal origin: Secondary | ICD-10-CM | POA: Diagnosis not present

## 2015-12-02 DIAGNOSIS — D631 Anemia in chronic kidney disease: Secondary | ICD-10-CM | POA: Diagnosis not present

## 2015-12-02 DIAGNOSIS — N186 End stage renal disease: Secondary | ICD-10-CM | POA: Diagnosis not present

## 2015-12-05 DIAGNOSIS — N186 End stage renal disease: Secondary | ICD-10-CM | POA: Diagnosis not present

## 2015-12-05 DIAGNOSIS — N2581 Secondary hyperparathyroidism of renal origin: Secondary | ICD-10-CM | POA: Diagnosis not present

## 2015-12-05 DIAGNOSIS — D631 Anemia in chronic kidney disease: Secondary | ICD-10-CM | POA: Diagnosis not present

## 2015-12-07 DIAGNOSIS — D631 Anemia in chronic kidney disease: Secondary | ICD-10-CM | POA: Diagnosis not present

## 2015-12-07 DIAGNOSIS — E1129 Type 2 diabetes mellitus with other diabetic kidney complication: Secondary | ICD-10-CM | POA: Diagnosis not present

## 2015-12-07 DIAGNOSIS — N186 End stage renal disease: Secondary | ICD-10-CM | POA: Diagnosis not present

## 2015-12-07 DIAGNOSIS — N2581 Secondary hyperparathyroidism of renal origin: Secondary | ICD-10-CM | POA: Diagnosis not present

## 2015-12-07 DIAGNOSIS — I4891 Unspecified atrial fibrillation: Secondary | ICD-10-CM | POA: Diagnosis not present

## 2015-12-07 LAB — PROTIME-INR: INR: 5.1 — AB (ref <=1.1)

## 2015-12-09 DIAGNOSIS — N2581 Secondary hyperparathyroidism of renal origin: Secondary | ICD-10-CM | POA: Diagnosis not present

## 2015-12-09 DIAGNOSIS — D631 Anemia in chronic kidney disease: Secondary | ICD-10-CM | POA: Diagnosis not present

## 2015-12-09 DIAGNOSIS — N186 End stage renal disease: Secondary | ICD-10-CM | POA: Diagnosis not present

## 2015-12-12 ENCOUNTER — Ambulatory Visit (INDEPENDENT_AMBULATORY_CARE_PROVIDER_SITE_OTHER): Payer: Medicare Other | Admitting: Pharmacist Clinician (PhC)/ Clinical Pharmacy Specialist

## 2015-12-12 ENCOUNTER — Other Ambulatory Visit: Payer: Self-pay | Admitting: Orthopedic Surgery

## 2015-12-12 DIAGNOSIS — N186 End stage renal disease: Secondary | ICD-10-CM | POA: Diagnosis not present

## 2015-12-12 DIAGNOSIS — I480 Paroxysmal atrial fibrillation: Secondary | ICD-10-CM

## 2015-12-12 DIAGNOSIS — N2581 Secondary hyperparathyroidism of renal origin: Secondary | ICD-10-CM | POA: Diagnosis not present

## 2015-12-12 DIAGNOSIS — D631 Anemia in chronic kidney disease: Secondary | ICD-10-CM | POA: Diagnosis not present

## 2015-12-13 ENCOUNTER — Ambulatory Visit (INDEPENDENT_AMBULATORY_CARE_PROVIDER_SITE_OTHER): Payer: Medicare Other | Admitting: Pharmacist

## 2015-12-13 DIAGNOSIS — I480 Paroxysmal atrial fibrillation: Secondary | ICD-10-CM

## 2015-12-13 DIAGNOSIS — I120 Hypertensive chronic kidney disease with stage 5 chronic kidney disease or end stage renal disease: Secondary | ICD-10-CM | POA: Diagnosis not present

## 2015-12-13 DIAGNOSIS — Z992 Dependence on renal dialysis: Secondary | ICD-10-CM | POA: Diagnosis not present

## 2015-12-13 DIAGNOSIS — N186 End stage renal disease: Secondary | ICD-10-CM | POA: Diagnosis not present

## 2015-12-13 LAB — POCT INR: INR: 2.7

## 2015-12-14 DIAGNOSIS — D631 Anemia in chronic kidney disease: Secondary | ICD-10-CM | POA: Diagnosis not present

## 2015-12-14 DIAGNOSIS — N186 End stage renal disease: Secondary | ICD-10-CM | POA: Diagnosis not present

## 2015-12-14 DIAGNOSIS — E1129 Type 2 diabetes mellitus with other diabetic kidney complication: Secondary | ICD-10-CM | POA: Diagnosis not present

## 2015-12-14 DIAGNOSIS — Z992 Dependence on renal dialysis: Secondary | ICD-10-CM | POA: Diagnosis not present

## 2015-12-14 DIAGNOSIS — N2581 Secondary hyperparathyroidism of renal origin: Secondary | ICD-10-CM | POA: Diagnosis not present

## 2015-12-16 DIAGNOSIS — E1129 Type 2 diabetes mellitus with other diabetic kidney complication: Secondary | ICD-10-CM | POA: Diagnosis not present

## 2015-12-16 DIAGNOSIS — Z992 Dependence on renal dialysis: Secondary | ICD-10-CM | POA: Diagnosis not present

## 2015-12-16 DIAGNOSIS — N186 End stage renal disease: Secondary | ICD-10-CM | POA: Diagnosis not present

## 2015-12-16 DIAGNOSIS — D631 Anemia in chronic kidney disease: Secondary | ICD-10-CM | POA: Diagnosis not present

## 2015-12-16 DIAGNOSIS — N2581 Secondary hyperparathyroidism of renal origin: Secondary | ICD-10-CM | POA: Diagnosis not present

## 2015-12-19 DIAGNOSIS — E1129 Type 2 diabetes mellitus with other diabetic kidney complication: Secondary | ICD-10-CM | POA: Diagnosis not present

## 2015-12-19 DIAGNOSIS — N186 End stage renal disease: Secondary | ICD-10-CM | POA: Diagnosis not present

## 2015-12-19 DIAGNOSIS — Z992 Dependence on renal dialysis: Secondary | ICD-10-CM | POA: Diagnosis not present

## 2015-12-19 DIAGNOSIS — D631 Anemia in chronic kidney disease: Secondary | ICD-10-CM | POA: Diagnosis not present

## 2015-12-19 DIAGNOSIS — N2581 Secondary hyperparathyroidism of renal origin: Secondary | ICD-10-CM | POA: Diagnosis not present

## 2015-12-21 DIAGNOSIS — N186 End stage renal disease: Secondary | ICD-10-CM | POA: Diagnosis not present

## 2015-12-21 DIAGNOSIS — N2581 Secondary hyperparathyroidism of renal origin: Secondary | ICD-10-CM | POA: Diagnosis not present

## 2015-12-21 DIAGNOSIS — Z992 Dependence on renal dialysis: Secondary | ICD-10-CM | POA: Diagnosis not present

## 2015-12-21 DIAGNOSIS — D631 Anemia in chronic kidney disease: Secondary | ICD-10-CM | POA: Diagnosis not present

## 2015-12-21 DIAGNOSIS — E1129 Type 2 diabetes mellitus with other diabetic kidney complication: Secondary | ICD-10-CM | POA: Diagnosis not present

## 2015-12-22 DIAGNOSIS — L84 Corns and callosities: Secondary | ICD-10-CM | POA: Diagnosis not present

## 2015-12-22 DIAGNOSIS — M216X2 Other acquired deformities of left foot: Secondary | ICD-10-CM | POA: Diagnosis not present

## 2015-12-22 DIAGNOSIS — L602 Onychogryphosis: Secondary | ICD-10-CM | POA: Diagnosis not present

## 2015-12-22 DIAGNOSIS — M216X1 Other acquired deformities of right foot: Secondary | ICD-10-CM | POA: Diagnosis not present

## 2015-12-22 DIAGNOSIS — E1151 Type 2 diabetes mellitus with diabetic peripheral angiopathy without gangrene: Secondary | ICD-10-CM | POA: Diagnosis not present

## 2015-12-23 DIAGNOSIS — D631 Anemia in chronic kidney disease: Secondary | ICD-10-CM | POA: Diagnosis not present

## 2015-12-23 DIAGNOSIS — N186 End stage renal disease: Secondary | ICD-10-CM | POA: Diagnosis not present

## 2015-12-23 DIAGNOSIS — Z992 Dependence on renal dialysis: Secondary | ICD-10-CM | POA: Diagnosis not present

## 2015-12-23 DIAGNOSIS — N2581 Secondary hyperparathyroidism of renal origin: Secondary | ICD-10-CM | POA: Diagnosis not present

## 2015-12-23 DIAGNOSIS — E1129 Type 2 diabetes mellitus with other diabetic kidney complication: Secondary | ICD-10-CM | POA: Diagnosis not present

## 2015-12-26 DIAGNOSIS — Z992 Dependence on renal dialysis: Secondary | ICD-10-CM | POA: Diagnosis not present

## 2015-12-26 DIAGNOSIS — N186 End stage renal disease: Secondary | ICD-10-CM | POA: Diagnosis not present

## 2015-12-26 DIAGNOSIS — D631 Anemia in chronic kidney disease: Secondary | ICD-10-CM | POA: Diagnosis not present

## 2015-12-26 DIAGNOSIS — E1129 Type 2 diabetes mellitus with other diabetic kidney complication: Secondary | ICD-10-CM | POA: Diagnosis not present

## 2015-12-26 DIAGNOSIS — N2581 Secondary hyperparathyroidism of renal origin: Secondary | ICD-10-CM | POA: Diagnosis not present

## 2015-12-28 DIAGNOSIS — N2581 Secondary hyperparathyroidism of renal origin: Secondary | ICD-10-CM | POA: Diagnosis not present

## 2015-12-28 DIAGNOSIS — Z992 Dependence on renal dialysis: Secondary | ICD-10-CM | POA: Diagnosis not present

## 2015-12-28 DIAGNOSIS — N186 End stage renal disease: Secondary | ICD-10-CM | POA: Diagnosis not present

## 2015-12-28 DIAGNOSIS — D631 Anemia in chronic kidney disease: Secondary | ICD-10-CM | POA: Diagnosis not present

## 2015-12-28 DIAGNOSIS — E1129 Type 2 diabetes mellitus with other diabetic kidney complication: Secondary | ICD-10-CM | POA: Diagnosis not present

## 2015-12-30 DIAGNOSIS — E1129 Type 2 diabetes mellitus with other diabetic kidney complication: Secondary | ICD-10-CM | POA: Diagnosis not present

## 2015-12-30 DIAGNOSIS — Z992 Dependence on renal dialysis: Secondary | ICD-10-CM | POA: Diagnosis not present

## 2015-12-30 DIAGNOSIS — D631 Anemia in chronic kidney disease: Secondary | ICD-10-CM | POA: Diagnosis not present

## 2015-12-30 DIAGNOSIS — N2581 Secondary hyperparathyroidism of renal origin: Secondary | ICD-10-CM | POA: Diagnosis not present

## 2015-12-30 DIAGNOSIS — N186 End stage renal disease: Secondary | ICD-10-CM | POA: Diagnosis not present

## 2016-01-01 DIAGNOSIS — N186 End stage renal disease: Secondary | ICD-10-CM | POA: Diagnosis not present

## 2016-01-01 DIAGNOSIS — E1129 Type 2 diabetes mellitus with other diabetic kidney complication: Secondary | ICD-10-CM | POA: Diagnosis not present

## 2016-01-01 DIAGNOSIS — M25562 Pain in left knee: Secondary | ICD-10-CM | POA: Diagnosis not present

## 2016-01-01 DIAGNOSIS — N2581 Secondary hyperparathyroidism of renal origin: Secondary | ICD-10-CM | POA: Diagnosis not present

## 2016-01-01 DIAGNOSIS — D631 Anemia in chronic kidney disease: Secondary | ICD-10-CM | POA: Diagnosis not present

## 2016-01-01 DIAGNOSIS — Z992 Dependence on renal dialysis: Secondary | ICD-10-CM | POA: Diagnosis not present

## 2016-01-01 DIAGNOSIS — M25561 Pain in right knee: Secondary | ICD-10-CM | POA: Diagnosis not present

## 2016-01-03 DIAGNOSIS — D631 Anemia in chronic kidney disease: Secondary | ICD-10-CM | POA: Diagnosis not present

## 2016-01-03 DIAGNOSIS — N186 End stage renal disease: Secondary | ICD-10-CM | POA: Diagnosis not present

## 2016-01-03 DIAGNOSIS — N2581 Secondary hyperparathyroidism of renal origin: Secondary | ICD-10-CM | POA: Diagnosis not present

## 2016-01-03 DIAGNOSIS — I4891 Unspecified atrial fibrillation: Secondary | ICD-10-CM | POA: Diagnosis not present

## 2016-01-03 DIAGNOSIS — E1129 Type 2 diabetes mellitus with other diabetic kidney complication: Secondary | ICD-10-CM | POA: Diagnosis not present

## 2016-01-03 DIAGNOSIS — Z992 Dependence on renal dialysis: Secondary | ICD-10-CM | POA: Diagnosis not present

## 2016-01-03 LAB — PROTIME-INR: INR: 3.7 — AB (ref 0.9–1.1)

## 2016-01-06 DIAGNOSIS — N2581 Secondary hyperparathyroidism of renal origin: Secondary | ICD-10-CM | POA: Diagnosis not present

## 2016-01-06 DIAGNOSIS — D631 Anemia in chronic kidney disease: Secondary | ICD-10-CM | POA: Diagnosis not present

## 2016-01-06 DIAGNOSIS — N186 End stage renal disease: Secondary | ICD-10-CM | POA: Diagnosis not present

## 2016-01-06 DIAGNOSIS — E1129 Type 2 diabetes mellitus with other diabetic kidney complication: Secondary | ICD-10-CM | POA: Diagnosis not present

## 2016-01-06 DIAGNOSIS — Z992 Dependence on renal dialysis: Secondary | ICD-10-CM | POA: Diagnosis not present

## 2016-01-09 ENCOUNTER — Ambulatory Visit (INDEPENDENT_AMBULATORY_CARE_PROVIDER_SITE_OTHER): Payer: Medicare Other | Admitting: Pharmacist Clinician (PhC)/ Clinical Pharmacy Specialist

## 2016-01-09 DIAGNOSIS — I480 Paroxysmal atrial fibrillation: Secondary | ICD-10-CM

## 2016-01-09 DIAGNOSIS — N2581 Secondary hyperparathyroidism of renal origin: Secondary | ICD-10-CM | POA: Diagnosis not present

## 2016-01-09 DIAGNOSIS — N186 End stage renal disease: Secondary | ICD-10-CM | POA: Diagnosis not present

## 2016-01-09 DIAGNOSIS — E1129 Type 2 diabetes mellitus with other diabetic kidney complication: Secondary | ICD-10-CM | POA: Diagnosis not present

## 2016-01-09 DIAGNOSIS — D631 Anemia in chronic kidney disease: Secondary | ICD-10-CM | POA: Diagnosis not present

## 2016-01-09 DIAGNOSIS — Z992 Dependence on renal dialysis: Secondary | ICD-10-CM | POA: Diagnosis not present

## 2016-01-09 LAB — PROTIME-INR: INR: 3.7 — AB (ref <=1.1)

## 2016-01-11 DIAGNOSIS — D631 Anemia in chronic kidney disease: Secondary | ICD-10-CM | POA: Diagnosis not present

## 2016-01-11 DIAGNOSIS — E1129 Type 2 diabetes mellitus with other diabetic kidney complication: Secondary | ICD-10-CM | POA: Diagnosis not present

## 2016-01-11 DIAGNOSIS — Z992 Dependence on renal dialysis: Secondary | ICD-10-CM | POA: Diagnosis not present

## 2016-01-11 DIAGNOSIS — N186 End stage renal disease: Secondary | ICD-10-CM | POA: Diagnosis not present

## 2016-01-11 DIAGNOSIS — N2581 Secondary hyperparathyroidism of renal origin: Secondary | ICD-10-CM | POA: Diagnosis not present

## 2016-01-12 DIAGNOSIS — Z992 Dependence on renal dialysis: Secondary | ICD-10-CM | POA: Diagnosis not present

## 2016-01-12 DIAGNOSIS — N186 End stage renal disease: Secondary | ICD-10-CM | POA: Diagnosis not present

## 2016-01-12 DIAGNOSIS — I120 Hypertensive chronic kidney disease with stage 5 chronic kidney disease or end stage renal disease: Secondary | ICD-10-CM | POA: Diagnosis not present

## 2016-01-13 DIAGNOSIS — E1129 Type 2 diabetes mellitus with other diabetic kidney complication: Secondary | ICD-10-CM | POA: Diagnosis not present

## 2016-01-13 DIAGNOSIS — Z992 Dependence on renal dialysis: Secondary | ICD-10-CM | POA: Diagnosis not present

## 2016-01-13 DIAGNOSIS — N186 End stage renal disease: Secondary | ICD-10-CM | POA: Diagnosis not present

## 2016-01-13 DIAGNOSIS — D631 Anemia in chronic kidney disease: Secondary | ICD-10-CM | POA: Diagnosis not present

## 2016-01-13 DIAGNOSIS — D689 Coagulation defect, unspecified: Secondary | ICD-10-CM | POA: Diagnosis not present

## 2016-01-13 DIAGNOSIS — N2581 Secondary hyperparathyroidism of renal origin: Secondary | ICD-10-CM | POA: Diagnosis not present

## 2016-01-16 DIAGNOSIS — E1129 Type 2 diabetes mellitus with other diabetic kidney complication: Secondary | ICD-10-CM | POA: Diagnosis not present

## 2016-01-16 DIAGNOSIS — D631 Anemia in chronic kidney disease: Secondary | ICD-10-CM | POA: Diagnosis not present

## 2016-01-16 DIAGNOSIS — N2581 Secondary hyperparathyroidism of renal origin: Secondary | ICD-10-CM | POA: Diagnosis not present

## 2016-01-16 DIAGNOSIS — Z992 Dependence on renal dialysis: Secondary | ICD-10-CM | POA: Diagnosis not present

## 2016-01-16 DIAGNOSIS — D689 Coagulation defect, unspecified: Secondary | ICD-10-CM | POA: Diagnosis not present

## 2016-01-16 DIAGNOSIS — N186 End stage renal disease: Secondary | ICD-10-CM | POA: Diagnosis not present

## 2016-01-17 ENCOUNTER — Encounter (HOSPITAL_COMMUNITY)
Admission: RE | Admit: 2016-01-17 | Discharge: 2016-01-17 | Disposition: A | Discharge status: Home / Self Care | Payer: Medicare Other | Source: Ambulatory Visit | Attending: Orthopedic Surgery | Admitting: Orthopedic Surgery

## 2016-01-17 ENCOUNTER — Ambulatory Visit (HOSPITAL_COMMUNITY)
Admission: RE | Admit: 2016-01-17 | Discharge: 2016-01-17 | Disposition: A | Discharge status: Home / Self Care | Payer: Medicare Other | Source: Ambulatory Visit | Attending: Orthopedic Surgery | Admitting: Orthopedic Surgery

## 2016-01-17 ENCOUNTER — Encounter (HOSPITAL_COMMUNITY): Payer: Self-pay

## 2016-01-17 DIAGNOSIS — I4891 Unspecified atrial fibrillation: Secondary | ICD-10-CM | POA: Diagnosis not present

## 2016-01-17 DIAGNOSIS — Z01818 Encounter for other preprocedural examination: Secondary | ICD-10-CM | POA: Diagnosis not present

## 2016-01-17 DIAGNOSIS — N186 End stage renal disease: Secondary | ICD-10-CM | POA: Diagnosis not present

## 2016-01-17 DIAGNOSIS — Z01812 Encounter for preprocedural laboratory examination: Secondary | ICD-10-CM | POA: Insufficient documentation

## 2016-01-17 DIAGNOSIS — E119 Type 2 diabetes mellitus without complications: Secondary | ICD-10-CM | POA: Diagnosis not present

## 2016-01-17 DIAGNOSIS — I120 Hypertensive chronic kidney disease with stage 5 chronic kidney disease or end stage renal disease: Secondary | ICD-10-CM | POA: Insufficient documentation

## 2016-01-17 HISTORY — DX: Cardiac arrhythmia, unspecified: I49.9

## 2016-01-17 LAB — COMPREHENSIVE METABOLIC PANEL
ALK PHOS: 93 U/L (ref 38–126)
ALT: 23 U/L (ref 17–63)
ANION GAP: 8 (ref 5–15)
AST: 17 U/L (ref 15–41)
Albumin: 3.2 g/dL — ABNORMAL LOW (ref 3.5–5.0)
BUN: 33 mg/dL — ABNORMAL HIGH (ref 6–20)
CALCIUM: 9.7 mg/dL (ref 8.9–10.3)
CO2: 29 mmol/L (ref 22–32)
Chloride: 97 mmol/L — ABNORMAL LOW (ref 101–111)
Creatinine, Ser: 7.62 mg/dL — ABNORMAL HIGH (ref 0.61–1.24)
GFR, EST AFRICAN AMERICAN: 8 mL/min — AB (ref >60)
GFR, EST NON AFRICAN AMERICAN: 7 mL/min — AB (ref >60)
Glucose, Bld: 134 mg/dL — ABNORMAL HIGH (ref 65–99)
Potassium: 4.8 mmol/L (ref 3.5–5.1)
SODIUM: 134 mmol/L — AB (ref 135–145)
TOTAL PROTEIN: 7.1 g/dL (ref 6.5–8.1)
Total Bilirubin: 0.7 mg/dL (ref 0.3–1.2)

## 2016-01-17 LAB — URINALYSIS, ROUTINE W REFLEX MICROSCOPIC
Glucose, UA: NEGATIVE mg/dL
KETONES UR: NEGATIVE mg/dL
Nitrite: NEGATIVE
PROTEIN: 100 mg/dL — AB
Specific Gravity, Urine: 1.016 (ref 1.005–1.030)
pH: 5 (ref 5.0–8.0)

## 2016-01-17 LAB — CBC WITH DIFFERENTIAL/PLATELET
Basophils Absolute: 0.1 10*3/uL (ref 0.0–0.1)
Basophils Relative: 1 %
EOS ABS: 0.7 10*3/uL (ref 0.0–0.7)
EOS PCT: 9 %
HCT: 36.2 % — ABNORMAL LOW (ref 39.0–52.0)
HEMOGLOBIN: 11.6 g/dL — AB (ref 13.0–17.0)
LYMPHS ABS: 2.3 10*3/uL (ref 0.7–4.0)
Lymphocytes Relative: 30 %
MCH: 30.9 pg (ref 26.0–34.0)
MCHC: 32 g/dL (ref 30.0–36.0)
MCV: 96.5 fL (ref 78.0–100.0)
Monocytes Absolute: 0.7 10*3/uL (ref 0.1–1.0)
Monocytes Relative: 9 %
Neutro Abs: 4 10*3/uL (ref 1.7–7.7)
Neutrophils Relative %: 51 %
PLATELETS: 196 10*3/uL (ref 150–400)
RBC: 3.75 MIL/uL — ABNORMAL LOW (ref 4.22–5.81)
RDW: 14.5 % (ref 11.5–15.5)
WBC: 7.6 10*3/uL (ref 4.0–10.5)

## 2016-01-17 LAB — PROTIME-INR
INR: 1.84
PROTHROMBIN TIME: 21.5 s — AB (ref 11.4–15.2)

## 2016-01-17 LAB — SURGICAL PCR SCREEN
MRSA, PCR: NEGATIVE
Staphylococcus aureus: NEGATIVE

## 2016-01-17 LAB — APTT: aPTT: 39 seconds — ABNORMAL HIGH (ref 24–36)

## 2016-01-17 LAB — NO BLOOD PRODUCTS

## 2016-01-17 LAB — GLUCOSE, CAPILLARY: GLUCOSE-CAPILLARY: 124 mg/dL — AB (ref 65–99)

## 2016-01-17 NOTE — Pre-Procedure Instructions (Signed)
    Aeon Kessner  01/17/2016    Your procedure is scheduled on Tuesday, December 12.  Report to [facility] Admitting at 5:30 A.M.              Your surgery or procedure is scheduled for 7:30 AM   Call this number if you have problems the morning of surgery: [phone]               For any other questions, please call [phone], Monday - Friday 8 AM - 4 PM.   Remember:  Do not eat food or drink liquids after midnight Monday, December 11.  Take these medicines the morning of surgery with A SIP OF WATER :allopurinol (ZYLOPRIM), carvedilol (COREG).                  Stop Coumadin as Instructed by Dr Berenice Primas.   Do not wear jewelry, make-up or nail polish.  Do not wear lotions, powders, or perfumes, or deodorant.  Men may shave face and neck  Do not bring valuables to the hospital.  [facility] is not responsible for any belongings or valuables.  Contacts, dentures or bridgework may not be worn into surgery.  Leave your suitcase in the car.  After surgery it may be brought to your room.  For patients admitted to the hospital, discharge time will be determined by your treatment team.  Special instructions: Review  [facility] - Preparing For Surgery.  Please read over the following fact sheets that you were given: [facility]- Preparing For Surgery and Patient Instructions for Mupirocin Application, Incentive Spirometry, Pain Booklet

## 2016-01-17 NOTE — [facility] Notes (Signed)
Daniel Whitney denies chest pain or shortness of breath. Daniel Whitney has a history of sleep apnea- does not use CPAP any longer- after he lost weight.  Daniel Muldrew reports that he had an MI in 48 -he was living in [place] at the time. Daniel Whitney has a history of Afib, Dr Debara Pickett is his cardiologist.  Daniel Whitney reports that he is to stop Coumadin 5 days prior to surgery and start another "medication." I found in Coumadin Clinic noted that Daniel Whitney is to start Lovenox, I instructed Daniel Whitney to call the clinic today and make an appointment- Daniel Whitney does not have Lovenox instructions aat this time.

## 2016-01-18 DIAGNOSIS — Z992 Dependence on renal dialysis: Secondary | ICD-10-CM | POA: Diagnosis not present

## 2016-01-18 DIAGNOSIS — D631 Anemia in chronic kidney disease: Secondary | ICD-10-CM | POA: Diagnosis not present

## 2016-01-18 DIAGNOSIS — E1129 Type 2 diabetes mellitus with other diabetic kidney complication: Secondary | ICD-10-CM | POA: Diagnosis not present

## 2016-01-18 DIAGNOSIS — D689 Coagulation defect, unspecified: Secondary | ICD-10-CM | POA: Diagnosis not present

## 2016-01-18 DIAGNOSIS — N2581 Secondary hyperparathyroidism of renal origin: Secondary | ICD-10-CM | POA: Diagnosis not present

## 2016-01-18 DIAGNOSIS — N186 End stage renal disease: Secondary | ICD-10-CM | POA: Diagnosis not present

## 2016-01-18 LAB — HEMOGLOBIN A1C
HEMOGLOBIN A1C: 6.7 % — AB (ref 4.8–5.6)
Mean Plasma Glucose: 146 mg/dL

## 2016-01-20 DIAGNOSIS — E1129 Type 2 diabetes mellitus with other diabetic kidney complication: Secondary | ICD-10-CM | POA: Diagnosis not present

## 2016-01-20 DIAGNOSIS — D631 Anemia in chronic kidney disease: Secondary | ICD-10-CM | POA: Diagnosis not present

## 2016-01-20 DIAGNOSIS — N2581 Secondary hyperparathyroidism of renal origin: Secondary | ICD-10-CM | POA: Diagnosis not present

## 2016-01-20 DIAGNOSIS — Z992 Dependence on renal dialysis: Secondary | ICD-10-CM | POA: Diagnosis not present

## 2016-01-20 DIAGNOSIS — D689 Coagulation defect, unspecified: Secondary | ICD-10-CM | POA: Diagnosis not present

## 2016-01-20 DIAGNOSIS — N186 End stage renal disease: Secondary | ICD-10-CM | POA: Diagnosis not present

## 2016-01-23 DIAGNOSIS — Z992 Dependence on renal dialysis: Secondary | ICD-10-CM | POA: Diagnosis not present

## 2016-01-23 DIAGNOSIS — E1129 Type 2 diabetes mellitus with other diabetic kidney complication: Secondary | ICD-10-CM | POA: Diagnosis not present

## 2016-01-23 DIAGNOSIS — D631 Anemia in chronic kidney disease: Secondary | ICD-10-CM | POA: Diagnosis not present

## 2016-01-23 DIAGNOSIS — D689 Coagulation defect, unspecified: Secondary | ICD-10-CM | POA: Diagnosis not present

## 2016-01-23 DIAGNOSIS — N2581 Secondary hyperparathyroidism of renal origin: Secondary | ICD-10-CM | POA: Diagnosis not present

## 2016-01-23 DIAGNOSIS — N186 End stage renal disease: Secondary | ICD-10-CM | POA: Diagnosis not present

## 2016-01-23 NOTE — Anesthesia Preprocedure Evaluation (Addendum)
Anesthesia Evaluation  Patient identified by MRN, date of birth, ID band Patient awake    Reviewed: Allergy & Precautions  Airway Mallampati: II  TM Distance: >3 FB     Dental   Pulmonary sleep apnea ,    breath sounds clear to auscultation       Cardiovascular hypertension, + Past MI, +CHF and + DOE  + dysrhythmias  Rhythm:Regular Rate:Normal     Neuro/Psych  Neuromuscular disease    GI/Hepatic Neg liver ROS, GERD  ,  Endo/Other  diabetesHypothyroidism   Renal/GU Renal disease     Musculoskeletal   Abdominal   Peds  Hematology  (+) anemia ,   Anesthesia Other Findings   Reproductive/Obstetrics                            Anesthesia Physical Anesthesia Plan  ASA: III  Anesthesia Plan: General   Post-op Pain Management:    Induction: Intravenous  Airway Management Planned: Oral ETT  Additional Equipment:   Intra-op Plan:   Post-operative Plan: Possible Post-op intubation/ventilation  Informed Consent: I have reviewed the patients History and Physical, chart, labs and discussed the procedure including the risks, benefits and alternatives for the proposed anesthesia with the patient or authorized representative who has indicated his/her understanding and acceptance.   Dental advisory given  Plan Discussed with: CRNA and Anesthesiologist  Anesthesia Plan Comments:         Anesthesia Quick Evaluation

## 2016-01-23 NOTE — H&P (Addendum)
TOTAL KNEE ADMISSION H&P  Patient is being admitted for right total knee arthroplasty.  Subjective:  Chief Complaint:right knee pain.  HPI: Daniel Whitney, 60 y.o. male, has a history of pain and functional disability in the right knee due to arthritis and has failed non-surgical conservative treatments for greater than 12 weeks to includeNSAID's and/or analgesics, viscosupplementation injections and activity modification.  Onset of symptoms was gradual, starting 6 years ago with gradually worsening course since that time. The patient noted no past surgery on the right knee(s).  Patient currently rates pain in the right knee(s) at 9 out of 10 with activity. Patient has night pain, worsening of pain with activity and weight bearing, pain that interferes with activities of daily living, pain with passive range of motion and joint swelling.  Patient has evidence of subchondral cysts, subchondral sclerosis, joint subluxation and joint space narrowing by imaging studies. This patient has had failure of all reasonable conservative care. There is no active infection.  Patient Active Problem List   Diagnosis Date Noted  . Low back pain 02/09/2015  . Bilateral knee pain 01/27/2015  . Atypical atrial flutter ([place]) 01/24/2015  . Chest pain   . Acute on chronic combined systolic and diastolic heart failure ([place]) 01/05/2015  . Hyperkalemia 11/08/2014  . End stage renal disease on dialysis ([place]) 11/08/2014  . Hypocalcemia 11/08/2014  . Weakness 11/08/2014  . Recurrent falls 10/13/2014  . Venous stasis ulcer ([place]) 10/13/2014  . Hypoxia   . Leukocytosis   . Respiratory failure ([place])   . Cardiac arrest ([place])   . ARDS (adult respiratory distress syndrome) ([place])   . Encounter for central line placement   . Asystole ([place]) 08/13/2014  . Cardiopulmonary arrest ([place])   . Compression fracture of L3 lumbar vertebra ([place]) 08/04/2014  . Anemia due to other cause 07/07/2014  . Allergic rhinitis 07/07/2014  .  Right rotator cuff tear 04/27/2014  . SOB (shortness of breath) 01/29/2014  . Acute gouty arthritis 11/11/2013  . Hearing loss in right ear 11/11/2013  . Chronic pain syndrome 11/11/2013  . Right shoulder pain 11/11/2013  . Facial rash 11/01/2013  . Right otitis externa 10/27/2013  . Pulmonary HTN- PA 58 mmHg 04/16/13 09/17/2013  . Right hip pain 08/04/2013  . DOE (dyspnea on exertion) 06/18/2013  . Allergic rhinitis, cause unspecified 06/18/2013  . Carpal tunnel syndrome 06/06/2013  . Leg pain, bilateral 06/06/2013  . Primary localized osteoarthrosis, lower leg 06/04/2013  . Encounter for therapeutic drug monitoring 03/10/2013  . Preventative health care 12/30/2012  . Atrial fibrillation- failed Amiodarone 12/30/2012  . CHF (congestive heart failure) ([place]) 12/30/2012  . Diabetic retinopathy ([place]) 12/30/2012  . Morbid obesity ([place]) 12/30/2012  . OSA (obstructive sleep apnea)- on C-pap 12/30/2012  . Pulmonary hypertension 12/30/2012  . Bilateral hearing loss 12/30/2012  . Long term (current) use of anticoagulants 12/30/2012  . Arthritis   . Glaucoma   . Diabetes mellitus with neuropathy ([place])   . Hypertension   . ESRD (end stage renal disease) on dialysis ([place])   . Stroke ([place])   . Hyperlipidemia    Past Medical History:  Diagnosis Date  . Allergic rhinitis 07/07/2014  . Anemia due to other cause 07/07/2014  . Arthritis    "all over"  . Atrial fibrillation ([place]) 12/30/2012  . CHF (congestive heart failure) ([place]) 12/30/2012  . Chronic lower back pain   . Diabetes mellitus with neuropathy ([place])   . Diabetic retinopathy ([place]) 12/30/2012   no medications  .  Dysrhythmia    Afib  . ESRD (end stage renal disease) on dialysis ([place]) started in 2013   MWF; [facility]; [employer]  . GERD (gastroesophageal reflux disease)   . Glaucoma   . History of gout    "right big toe"  . Hyperlipidemia   . Hypertension   . Hypothyroid    01/17/16- no longer on meduication  . Morbid  obesity ([place]) 12/30/2012  . Myocardial infarction    "I've had ~ 3; last one was in ~ 10/2013" (01/30/2014)  . Neuropathy ([place])   . OSA (obstructive sleep apnea) 12/30/2012   "lost weight; no longer needed CPAP; retested said I needed it; didn't followup cause I was feeling fine" (01/30/2014)  . Pulmonary hypertension 12/30/2012  . Refusal of blood transfusions as patient is Jehovah's Witness   . Stroke [facility]) ~ 2005; ~ 2005   "they were mild; I didn't even notice I'd had them"; denies residual on 01/30/2014    Past Surgical History:  Procedure Laterality Date  . AV FISTULA PLACEMENT Left ~ 10/2013   "forearm"  . CARDIAC CATHETERIZATION    . CARDIAC CATHETERIZATION N/A 01/07/2015   Procedure: Left Heart Cath and Coronary Angiography;  Surgeon: Troy Sine, MD;  Location: [facility];  Service: Cardiovascular;  Laterality: N/A;  . CLOSED REDUCTION HIP DISLOCATION Right 1970's  . TOTAL HIP ARTHROPLASTY Right 1980    No prescriptions prior to admission.   Allergies  Allergen Reactions  . No Known Allergies     Social History  Substance Use Topics  . Smoking status: Never Smoker  . Smokeless tobacco: Never Used  . Alcohol use 4.2 - 4.8 oz/week    3 - 4 Standard drinks or equivalent, 3 Shots of liquor, 1 Cans of beer per week     Comment: 01/30/2014 "none in the last 3 weeks; primarily drink w/my friends on the weekends"    Family History  Problem Relation Age of Onset  . Arthritis Other   . Heart disease Other   . Hyperlipidemia Other   . Hypertension Other   . Kidney disease Other   . Diabetes Other   . Stroke Other      ROS ROS: I have reviewed the patient's review of systems thoroughly and there are no positive responses as relates to the HPI. Objective:  Physical Exam  Vital signs in last 24 hours:    Vitals:   01/24/16 0649  BP: 98/68  Pulse: 96  Resp: 18  Temp: 98.8 F (37.1 C)    Well-developed well-nourished patient in no acute  distress. Alert and oriented x3 HEENT:within normal limits Cardiac: Regular rate and rhythm Pulmonary: Lungs clear to auscultation Abdomen: Soft and nontender.  Normal active bowel sounds  Musculoskeletal: right knee cold limited range of motion.  No instability.  Trace effusion.  Grinding and pain to range of motion. Labs: . Recent Results (from the past 2160 hour(s))  Protime-INR     Status: Abnormal   Collection Time: 11/09/15 12:00 AM  Result Value Ref Range   INR 2.2 (A) 0.9 - 1.1  Protime-INR     Status: Abnormal   Collection Time: 11/23/15 12:00 AM  Result Value Ref Range   INR 2.9 (A) 0.9 - 1.1    Comment: dialysis  Protime-INR     Status: Abnormal   Collection Time: 12/07/15 12:00 AM  Result Value Ref Range   INR 5.1 (A) 0.9 - 1.1  POCT INR  Status: None   Collection Time: 12/13/15 12:00 AM  Result Value Ref Range   INR 2.7   Protime-INR     Status: Abnormal   Collection Time: 01/03/16 12:00 AM  Result Value Ref Range   INR 3.7 (A) 0.9 - 1.1  Protime-INR     Status: Abnormal   Collection Time: 01/09/16 12:00 AM  Result Value Ref Range   INR 3.7 (A) 0.9 - 1.1  Glucose, capillary     Status: Abnormal   Collection Time: 01/17/16 10:12 AM  Result Value Ref Range   Glucose-Capillary 124 (H) 65 - 99 mg/dL  No blood products     Status: None   Collection Time: 01/17/16 10:39 AM  Result Value Ref Range   Transfuse 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      TRANSFUSE NO BLOOD PRODUCTS, VERIFIED BY JANICE GOLTARE,RN  Surgical pcr screen     Status: None   Collection Time: 01/17/16 10:46 AM  Result Value Ref Range   MRSA, PCR NEGATIVE NEGATIVE   Staphylococcus aureus NEGATIVE NEGATIVE    Comment:        The Xpert SA Assay (FDA approved for NASAL specimens in patients over 82 years of age), is one component of a comprehensive surveillance program.  Test performance has been validated by [facility] for patients greater than or equal to 51 year old. It is not intended to  diagnose infection nor to guide or monitor treatment.   APTT     Status: Abnormal   Collection Time: 01/17/16 10:47 AM  Result Value Ref Range   aPTT 39 (H) 24 - 36 seconds    Comment:        IF BASELINE aPTT IS ELEVATED, SUGGEST PATIENT RISK ASSESSMENT BE USED TO DETERMINE APPROPRIATE ANTICOAGULANT THERAPY.   CBC WITH DIFFERENTIAL     Status: Abnormal   Collection Time: 01/17/16 10:47 AM  Result Value Ref Range   WBC 7.6 4.0 - 10.5 K/uL   RBC 3.75 (L) 4.22 - 5.81 MIL/uL   Hemoglobin 11.6 (L) 13.0 - 17.0 g/dL   HCT 36.2 (L) 39.0 - 52.0 %   MCV 96.5 78.0 - 100.0 fL   MCH 30.9 26.0 - 34.0 pg   MCHC 32.0 30.0 - 36.0 g/dL   RDW 14.5 11.5 - 15.5 %   Platelets 196 150 - 400 K/uL   Neutrophils Relative % 51 %   Neutro Abs 4.0 1.7 - 7.7 K/uL   Lymphocytes Relative 30 %   Lymphs Abs 2.3 0.7 - 4.0 K/uL   Monocytes Relative 9 %   Monocytes Absolute 0.7 0.1 - 1.0 K/uL   Eosinophils Relative 9 %   Eosinophils Absolute 0.7 0.0 - 0.7 K/uL   Basophils Relative 1 %   Basophils Absolute 0.1 0.0 - 0.1 K/uL  Comprehensive metabolic panel     Status: Abnormal   Collection Time: 01/17/16 10:47 AM  Result Value Ref Range   Sodium 134 (L) 135 - 145 mmol/L   Potassium 4.8 3.5 - 5.1 mmol/L   [place] 97 (L) 101 - 111 mmol/L   CO2 29 22 - 32 mmol/L   Glucose, Bld 134 (H) 65 - 99 mg/dL   BUN 33 (H) 6 - 20 mg/dL   Creatinine, Ser 7.62 (H) 0.61 - 1.24 mg/dL   Calcium 9.7 8.9 - 10.3 mg/dL   Total Protein 7.1 6.5 - 8.1 g/dL   Albumin 3.2 (L) 3.5 - 5.0 g/dL   AST 17 15 - 41 U/L  ALT 23 17 - 63 U/L   Alkaline Phosphatase 93 38 - 126 U/L   Total Bilirubin 0.7 0.3 - 1.2 mg/dL   GFR calc non Af Amer 7 (L) >60 mL/min   GFR calc Af Amer 8 (L) >60 mL/min    Comment: (NOTE) The eGFR has been calculated using the CKD EPI equation. This calculation has not been validated in all clinical situations. eGFR's persistently <60 mL/min signify possible Chronic Kidney Disease.    Anion gap 8 5 - 15   Protime-INR     Status: Abnormal   Collection Time: 01/17/16 10:47 AM  Result Value Ref Range   Prothrombin Time 21.5 (H) 11.4 - 15.2 seconds   INR 1.84   Urinalysis, Routine w reflex microscopic (not at [facility])     Status: Abnormal   Collection Time: 01/17/16 10:47 AM  Result Value Ref Range   Color, Urine YELLOW YELLOW   APPearance CLOUDY (A) CLEAR   Specific Gravity, Urine 1.016 1.005 - 1.030   pH 5.0 5.0 - 8.0   Glucose, UA NEGATIVE NEGATIVE mg/dL   Hgb urine dipstick LARGE (A) NEGATIVE   Bilirubin Urine SMALL (A) NEGATIVE   Ketones, ur NEGATIVE NEGATIVE mg/dL   Protein, ur 100 (A) NEGATIVE mg/dL   Nitrite NEGATIVE NEGATIVE   Leukocytes, UA LARGE (A) NEGATIVE   RBC / HPF TOO NUMEROUS TO COUNT 0 - 5 RBC/hpf   WBC, UA TOO NUMEROUS TO COUNT 0 - 5 WBC/hpf   Bacteria, UA RARE (A) NONE SEEN   Squamous Epithelial / LPF 0-5 (A) NONE SEEN   Mucous PRESENT    Hyaline Casts, UA PRESENT    Amorphous Crystal PRESENT    Non Squamous Epithelial 0-5 (A) NONE SEEN  Hemoglobin A1c     Status: Abnormal   Collection Time: 01/17/16 11:15 AM  Result Value Ref Range   Hgb A1c MFr Bld 6.7 (H) 4.8 - 5.6 %    Comment: (NOTE)         Pre-diabetes: 5.7 - 6.4         Diabetes: >6.4         Glycemic control for adults with diabetes: <7.0    Mean Plasma Glucose 146 mg/dL    Comment: (NOTE) Performed At: [facility] [address] [place], [place] [id number] Lindon Romp MD HF:[id number]     Estimated body mass index is 37.73 kg/m as calculated from the following:   Height as of 01/17/16: '5\' 9"'  (1.753 m).   Weight as of 01/17/16: 115.9 kg (255 lb 8.2 oz).   Imaging Review Plain radiographs demonstrate severe degenerative joint disease of the right knee(s). The overall alignment ismild varus. The bone quality appears to be fair for age and reported activity level.  Assessment/Plan:  End stage arthritis, right knee   The patient history, physical examination, clinical judgment  of the provider and imaging studies are consistent with end stage degenerative joint disease of the right knee(s) and total knee arthroplasty is deemed medically necessary. The treatment options including medical management, injection therapy arthroscopy and arthroplasty were discussed at length. The risks and benefits of total knee arthroplasty were presented and reviewed. The risks due to aseptic loosening, infection, stiffness, patella tracking problems, thromboembolic complications and other imponderables were discussed. The patient acknowledged the explanation, agreed to proceed with the plan and consent was signed. Patient is being admitted for inpatient treatment for surgery, pain control, PT, OT, prophylactic antibiotics, VTE prophylaxis, progressive ambulation and ADL's and discharge  planning. The patient is planning to be discharged home with home health services   There was significant discussion with the patient and his brother today. The patient was supposed to be off Coumadin for 5 days trying to get his INR down to normal. The patient only been off of Coumadin for 2 days. We awaited an INR today and had made a decision that the INR needed to be under 1.5 for this case to be done safely. Anesthesia was comfortable with an INR 1.43 which turned out to be this morning. They will pull a central line because he has no significant access. The patient certainly understands the increased risk related to the potential for need for blood transfusion and given that he is a Jehovah's Witness and his desire not to be transfused. At this point with everyone understanding the risks to patient as well as the take and the patient himself understanding the risks quite well we will plan on proceeding with total knee replacement. We will start him back on Coumadin post surgery.

## 2016-01-24 ENCOUNTER — Encounter (HOSPITAL_COMMUNITY)
Admission: RE | Disposition: A | Discharge status: Skilled Nursing Facility | Payer: Self-pay | Source: Ambulatory Visit | Attending: Orthopedic Surgery

## 2016-01-24 ENCOUNTER — Encounter (HOSPITAL_COMMUNITY): Payer: Self-pay | Admitting: General Practice

## 2016-01-24 ENCOUNTER — Inpatient Hospital Stay (HOSPITAL_COMMUNITY): Payer: Medicare Other

## 2016-01-24 ENCOUNTER — Inpatient Hospital Stay (HOSPITAL_COMMUNITY)
Admission: RE | Admit: 2016-01-24 | Discharge: 2016-01-27 | DRG: 469 | Disposition: A | Discharge status: Skilled Nursing Facility | Payer: Medicare Other | Source: Ambulatory Visit | Attending: Orthopedic Surgery | Admitting: Orthopedic Surgery

## 2016-01-24 ENCOUNTER — Inpatient Hospital Stay (HOSPITAL_COMMUNITY): Payer: Medicare Other | Admitting: Certified Registered Nurse Anesthetist

## 2016-01-24 DIAGNOSIS — G894 Chronic pain syndrome: Secondary | ICD-10-CM | POA: Diagnosis present

## 2016-01-24 DIAGNOSIS — Z7901 Long term (current) use of anticoagulants: Secondary | ICD-10-CM | POA: Diagnosis not present

## 2016-01-24 DIAGNOSIS — H409 Unspecified glaucoma: Secondary | ICD-10-CM | POA: Diagnosis present

## 2016-01-24 DIAGNOSIS — Z992 Dependence on renal dialysis: Secondary | ICD-10-CM | POA: Diagnosis not present

## 2016-01-24 DIAGNOSIS — M6281 Muscle weakness (generalized): Secondary | ICD-10-CM | POA: Diagnosis not present

## 2016-01-24 DIAGNOSIS — Z96659 Presence of unspecified artificial knee joint: Secondary | ICD-10-CM

## 2016-01-24 DIAGNOSIS — I272 Pulmonary hypertension, unspecified: Secondary | ICD-10-CM | POA: Diagnosis present

## 2016-01-24 DIAGNOSIS — Z823 Family history of stroke: Secondary | ICD-10-CM

## 2016-01-24 DIAGNOSIS — I252 Old myocardial infarction: Secondary | ICD-10-CM

## 2016-01-24 DIAGNOSIS — M545 Low back pain: Secondary | ICD-10-CM | POA: Diagnosis present

## 2016-01-24 DIAGNOSIS — I5042 Chronic combined systolic (congestive) and diastolic (congestive) heart failure: Secondary | ICD-10-CM | POA: Diagnosis not present

## 2016-01-24 DIAGNOSIS — D62 Acute posthemorrhagic anemia: Secondary | ICD-10-CM | POA: Diagnosis not present

## 2016-01-24 DIAGNOSIS — H9193 Unspecified hearing loss, bilateral: Secondary | ICD-10-CM | POA: Diagnosis present

## 2016-01-24 DIAGNOSIS — N186 End stage renal disease: Secondary | ICD-10-CM | POA: Diagnosis present

## 2016-01-24 DIAGNOSIS — Z96651 Presence of right artificial knee joint: Secondary | ICD-10-CM | POA: Diagnosis not present

## 2016-01-24 DIAGNOSIS — M109 Gout, unspecified: Secondary | ICD-10-CM | POA: Diagnosis present

## 2016-01-24 DIAGNOSIS — D649 Anemia, unspecified: Secondary | ICD-10-CM | POA: Diagnosis present

## 2016-01-24 DIAGNOSIS — Z8674 Personal history of sudden cardiac arrest: Secondary | ICD-10-CM

## 2016-01-24 DIAGNOSIS — E1140 Type 2 diabetes mellitus with diabetic neuropathy, unspecified: Secondary | ICD-10-CM | POA: Diagnosis present

## 2016-01-24 DIAGNOSIS — G4733 Obstructive sleep apnea (adult) (pediatric): Secondary | ICD-10-CM | POA: Diagnosis present

## 2016-01-24 DIAGNOSIS — E1122 Type 2 diabetes mellitus with diabetic chronic kidney disease: Secondary | ICD-10-CM | POA: Diagnosis present

## 2016-01-24 DIAGNOSIS — Z96641 Presence of right artificial hip joint: Secondary | ICD-10-CM | POA: Diagnosis present

## 2016-01-24 DIAGNOSIS — K219 Gastro-esophageal reflux disease without esophagitis: Secondary | ICD-10-CM | POA: Diagnosis present

## 2016-01-24 DIAGNOSIS — Z6835 Body mass index (BMI) 35.0-35.9, adult: Secondary | ICD-10-CM | POA: Diagnosis not present

## 2016-01-24 DIAGNOSIS — Z452 Encounter for adjustment and management of vascular access device: Secondary | ICD-10-CM | POA: Diagnosis not present

## 2016-01-24 DIAGNOSIS — G8918 Other acute postprocedural pain: Secondary | ICD-10-CM | POA: Diagnosis not present

## 2016-01-24 DIAGNOSIS — E785 Hyperlipidemia, unspecified: Secondary | ICD-10-CM | POA: Diagnosis present

## 2016-01-24 DIAGNOSIS — M1711 Unilateral primary osteoarthritis, right knee: Principal | ICD-10-CM | POA: Diagnosis present

## 2016-01-24 DIAGNOSIS — N2581 Secondary hyperparathyroidism of renal origin: Secondary | ICD-10-CM | POA: Diagnosis present

## 2016-01-24 DIAGNOSIS — Z8673 Personal history of transient ischemic attack (TIA), and cerebral infarction without residual deficits: Secondary | ICD-10-CM

## 2016-01-24 DIAGNOSIS — D631 Anemia in chronic kidney disease: Secondary | ICD-10-CM | POA: Diagnosis not present

## 2016-01-24 DIAGNOSIS — G8929 Other chronic pain: Secondary | ICD-10-CM | POA: Diagnosis present

## 2016-01-24 DIAGNOSIS — M25569 Pain in unspecified knee: Secondary | ICD-10-CM | POA: Diagnosis not present

## 2016-01-24 DIAGNOSIS — I4891 Unspecified atrial fibrillation: Secondary | ICD-10-CM | POA: Diagnosis present

## 2016-01-24 DIAGNOSIS — E11319 Type 2 diabetes mellitus with unspecified diabetic retinopathy without macular edema: Secondary | ICD-10-CM | POA: Diagnosis not present

## 2016-01-24 DIAGNOSIS — R531 Weakness: Secondary | ICD-10-CM | POA: Diagnosis not present

## 2016-01-24 DIAGNOSIS — Z471 Aftercare following joint replacement surgery: Secondary | ICD-10-CM | POA: Diagnosis not present

## 2016-01-24 DIAGNOSIS — Z833 Family history of diabetes mellitus: Secondary | ICD-10-CM

## 2016-01-24 DIAGNOSIS — I1310 Hypertensive heart and chronic kidney disease without heart failure, with stage 1 through stage 4 chronic kidney disease, or unspecified chronic kidney disease: Secondary | ICD-10-CM | POA: Diagnosis not present

## 2016-01-24 DIAGNOSIS — I120 Hypertensive chronic kidney disease with stage 5 chronic kidney disease or end stage renal disease: Secondary | ICD-10-CM | POA: Diagnosis not present

## 2016-01-24 HISTORY — PX: TOTAL KNEE ARTHROPLASTY: SHX125

## 2016-01-24 LAB — POCT I-STAT 4, (NA,K, GLUC, HGB,HCT)
Glucose, Bld: 111 mg/dL — ABNORMAL HIGH (ref 65–99)
HEMATOCRIT: 34 % — AB (ref 39.0–52.0)
Hemoglobin: 11.6 g/dL — ABNORMAL LOW (ref 13.0–17.0)
Potassium: 4.1 mmol/L (ref 3.5–5.1)
SODIUM: 134 mmol/L — AB (ref 135–145)

## 2016-01-24 LAB — GLUCOSE, CAPILLARY: Glucose-Capillary: 189 mg/dL — ABNORMAL HIGH (ref 65–99)

## 2016-01-24 LAB — PROTIME-INR
INR: 1.43
PROTHROMBIN TIME: 17.5 s — AB (ref 11.4–15.2)

## 2016-01-24 LAB — NO BLOOD PRODUCTS

## 2016-01-24 SURGERY — ARTHROPLASTY, KNEE, TOTAL
Anesthesia: General | Site: Knee | Laterality: Right

## 2016-01-24 MED ORDER — BUPIVACAINE HCL (PF) 0.5 % IJ SOLN
INTRAMUSCULAR | Status: DC | PRN
  Administered 2016-01-24: 20 mL

## 2016-01-24 MED ORDER — CHLORHEXIDINE GLUCONATE 4 % EX LIQD: 60.0000 mL | Freq: Once | CUTANEOUS | Status: DC

## 2016-01-24 MED ORDER — RENA-VITE PO TABS
1.0000 | ORAL_TABLET | Freq: Every day | ORAL | Status: DC
  Administered 2016-01-24 – 2016-01-26 (×3): 1 via ORAL
  Filled 2016-01-24 (×2): qty 1

## 2016-01-24 MED ORDER — PROPOFOL 10 MG/ML IV BOLUS
INTRAVENOUS | Status: DC | PRN
  Administered 2016-01-24 (×2): 10 mg via INTRAVENOUS

## 2016-01-24 MED ORDER — METHOCARBAMOL 500 MG PO TABS
ORAL_TABLET | ORAL | Status: AC
  Filled 2016-01-24: qty 1

## 2016-01-24 MED ORDER — CEFUROXIME SODIUM 1.5 G IJ SOLR
INTRAMUSCULAR | Status: AC
  Filled 2016-01-24: qty 1.5

## 2016-01-24 MED ORDER — MENTHOL 3 MG MT LOZG: 1.0000 | LOZENGE | OROMUCOSAL | Status: DC | PRN

## 2016-01-24 MED ORDER — CEFAZOLIN SODIUM-DEXTROSE 2-4 GM/100ML-% IV SOLN
2.0000 g | INTRAVENOUS | Status: AC
  Administered 2016-01-24: 2 g via INTRAVENOUS
  Filled 2016-01-24: qty 100

## 2016-01-24 MED ORDER — BUPIVACAINE HCL (PF) 0.5 % IJ SOLN
INTRAMUSCULAR | Status: AC
  Filled 2016-01-24: qty 30

## 2016-01-24 MED ORDER — SODIUM CHLORIDE 0.9% FLUSH
10.0000 mL | INTRAVENOUS | Status: DC | PRN
  Administered 2016-01-25 – 2016-01-27 (×2): 10 mL
  Filled 2016-01-24 (×2): qty 40

## 2016-01-24 MED ORDER — PROPOFOL 10 MG/ML IV BOLUS
INTRAVENOUS | Status: AC
  Filled 2016-01-24: qty 40

## 2016-01-24 MED ORDER — TRANEXAMIC ACID 1000 MG/10ML IV SOLN
2000.0000 mg | INTRAVENOUS | Status: AC
  Administered 2016-01-24: 2000 mg via TOPICAL
  Filled 2016-01-24: qty 20

## 2016-01-24 MED ORDER — PHENOL 1.4 % MT LIQD: 1.0000 | OROMUCOSAL | Status: DC | PRN

## 2016-01-24 MED ORDER — WARFARIN SODIUM 3 MG PO TABS
3.0000 mg | ORAL_TABLET | Freq: Once | ORAL | Status: AC
  Administered 2016-01-24: 3 mg via ORAL
  Filled 2016-01-24: qty 1

## 2016-01-24 MED ORDER — BUPIVACAINE LIPOSOME 1.3 % IJ SUSP
20.0000 mL | INTRAMUSCULAR | Status: AC
  Administered 2016-01-24: 20 mL
  Filled 2016-01-24: qty 20

## 2016-01-24 MED ORDER — ONDANSETRON HCL 4 MG/2ML IJ SOLN
4.0000 mg | Freq: Four times a day (QID) | INTRAMUSCULAR | Status: DC | PRN
  Administered 2016-01-25: 4 mg via INTRAVENOUS
  Filled 2016-01-24: qty 2

## 2016-01-24 MED ORDER — BISACODYL 10 MG RE SUPP: 10.0000 mg | Freq: Every day | RECTAL | Status: DC | PRN

## 2016-01-24 MED ORDER — HYDROCODONE-ACETAMINOPHEN 7.5-325 MG PO TABS
1.0000 | ORAL_TABLET | Freq: Four times a day (QID) | ORAL | Status: DC
  Administered 2016-01-24 – 2016-01-26 (×5): 1 via ORAL
  Filled 2016-01-24 (×6): qty 1

## 2016-01-24 MED ORDER — GABAPENTIN 100 MG PO CAPS
100.0000 mg | ORAL_CAPSULE | Freq: Three times a day (TID) | ORAL | Status: DC
  Administered 2016-01-24 – 2016-01-27 (×8): 100 mg via ORAL
  Filled 2016-01-24 (×7): qty 1

## 2016-01-24 MED ORDER — EPHEDRINE SULFATE 50 MG/ML IJ SOLN
INTRAMUSCULAR | Status: DC | PRN
  Administered 2016-01-24 (×2): 5 mg via INTRAVENOUS

## 2016-01-24 MED ORDER — DOXERCALCIFEROL 4 MCG/2ML IV SOLN
1.0000 ug | INTRAVENOUS | Status: DC
  Administered 2016-01-26 – 2016-01-27 (×2): 1 ug via INTRAVENOUS
  Filled 2016-01-24 (×2): qty 2

## 2016-01-24 MED ORDER — CARVEDILOL 12.5 MG PO TABS
ORAL_TABLET | ORAL | Status: AC
  Filled 2016-01-24: qty 2

## 2016-01-24 MED ORDER — CARVEDILOL 12.5 MG PO TABS
25.0000 mg | ORAL_TABLET | Freq: Once | ORAL | Status: AC
  Administered 2016-01-24: 25 mg via ORAL

## 2016-01-24 MED ORDER — CEFUROXIME SODIUM 1.5 G IJ SOLR
INTRAMUSCULAR | Status: DC | PRN
  Administered 2016-01-24: 1.5 g

## 2016-01-24 MED ORDER — TRANEXAMIC ACID 1000 MG/10ML IV SOLN
1000.0000 mg | Freq: Once | INTRAVENOUS | Status: AC
  Administered 2016-01-24: 1000 mg via INTRAVENOUS
  Filled 2016-01-24: qty 10

## 2016-01-24 MED ORDER — 0.9 % SODIUM CHLORIDE (POUR BTL) OPTIME
TOPICAL | Status: DC | PRN
  Administered 2016-01-24: 1000 mL

## 2016-01-24 MED ORDER — SODIUM CHLORIDE 0.9 % IV SOLN
INTRAVENOUS | Status: DC | PRN
Start: 2016-01-24 — End: 2016-01-24
  Administered 2016-01-24: 09:00:00 via INTRAVENOUS

## 2016-01-24 MED ORDER — SODIUM CHLORIDE 0.9 % IR SOLN
Status: DC | PRN
  Administered 2016-01-24: 3000 mL

## 2016-01-24 MED ORDER — CARVEDILOL 25 MG PO TABS
25.0000 mg | ORAL_TABLET | Freq: Two times a day (BID) | ORAL | Status: DC
  Administered 2016-01-25 – 2016-01-27 (×5): 25 mg via ORAL
  Filled 2016-01-24 (×5): qty 1

## 2016-01-24 MED ORDER — MIDAZOLAM HCL 2 MG/2ML IJ SOLN
INTRAMUSCULAR | Status: AC
  Filled 2016-01-24: qty 2

## 2016-01-24 MED ORDER — TRANEXAMIC ACID 1000 MG/10ML IV SOLN: 2000.0000 mg | INTRAVENOUS | Status: DC

## 2016-01-24 MED ORDER — WARFARIN - PHARMACIST DOSING INPATIENT: Freq: Every day | Status: DC

## 2016-01-24 MED ORDER — ACETAMINOPHEN 650 MG RE SUPP: 650.0000 mg | Freq: Four times a day (QID) | RECTAL | Status: DC | PRN

## 2016-01-24 MED ORDER — HYDROMORPHONE HCL 1 MG/ML IJ SOLN: 1.0000 mg | INTRAMUSCULAR | Status: DC | PRN

## 2016-01-24 MED ORDER — OXYCODONE HCL 5 MG PO TABS
5.0000 mg | ORAL_TABLET | ORAL | Status: DC | PRN
  Administered 2016-01-24 – 2016-01-27 (×6): 10 mg via ORAL
  Filled 2016-01-24 (×4): qty 2

## 2016-01-24 MED ORDER — FENTANYL CITRATE (PF) 100 MCG/2ML IJ SOLN
INTRAMUSCULAR | Status: DC | PRN
  Administered 2016-01-24 (×2): 50 ug via INTRAVENOUS
  Administered 2016-01-24: 100 ug via INTRAVENOUS

## 2016-01-24 MED ORDER — DIPHENHYDRAMINE HCL 12.5 MG/5ML PO ELIX: 12.5000 mg | ORAL_SOLUTION | Freq: Three times a day (TID) | ORAL | Status: DC | PRN

## 2016-01-24 MED ORDER — ALLOPURINOL 100 MG PO TABS
100.0000 mg | ORAL_TABLET | Freq: Two times a day (BID) | ORAL | Status: DC
  Administered 2016-01-24 – 2016-01-27 (×6): 100 mg via ORAL
  Filled 2016-01-24 (×6): qty 1

## 2016-01-24 MED ORDER — TRANEXAMIC ACID 1000 MG/10ML IV SOLN: 1000.0000 mg | INTRAVENOUS | Status: DC

## 2016-01-24 MED ORDER — SEVELAMER CARBONATE 800 MG PO TABS
800.0000 mg | ORAL_TABLET | ORAL | Status: DC
  Administered 2016-01-24 – 2016-01-27 (×6): 800 mg via ORAL
  Filled 2016-01-24 (×2): qty 1

## 2016-01-24 MED ORDER — ACETAMINOPHEN 325 MG PO TABS: 650.0000 mg | ORAL_TABLET | Freq: Four times a day (QID) | ORAL | Status: DC | PRN

## 2016-01-24 MED ORDER — LACTATED RINGERS IV SOLN: INTRAVENOUS | Status: DC

## 2016-01-24 MED ORDER — SEVELAMER CARBONATE 800 MG PO TABS
2400.0000 mg | ORAL_TABLET | Freq: Three times a day (TID) | ORAL | Status: DC
  Administered 2016-01-25 – 2016-01-27 (×6): 2400 mg via ORAL
  Filled 2016-01-24 (×10): qty 3

## 2016-01-24 MED ORDER — HYDROMORPHONE HCL 2 MG/ML IJ SOLN
1.0000 mg | INTRAMUSCULAR | Status: DC | PRN
  Administered 2016-01-24 – 2016-01-26 (×3): 1 mg via INTRAVENOUS
  Filled 2016-01-24 (×2): qty 1

## 2016-01-24 MED ORDER — DOCUSATE SODIUM 100 MG PO CAPS
100.0000 mg | ORAL_CAPSULE | Freq: Two times a day (BID) | ORAL | Status: DC
  Administered 2016-01-24 – 2016-01-27 (×6): 100 mg via ORAL
  Filled 2016-01-24 (×6): qty 1

## 2016-01-24 MED ORDER — CARVEDILOL 12.5 MG PO TABS: 25.0000 mg | ORAL_TABLET | Freq: Once | ORAL | Status: DC

## 2016-01-24 MED ORDER — ETOMIDATE 2 MG/ML IV SOLN
INTRAVENOUS | Status: DC | PRN
  Administered 2016-01-24: 16 mg via INTRAVENOUS

## 2016-01-24 MED ORDER — METOCLOPRAMIDE HCL 5 MG/ML IJ SOLN: 5.0000 mg | Freq: Three times a day (TID) | INTRAMUSCULAR | Status: DC | PRN

## 2016-01-24 MED ORDER — SIMVASTATIN 20 MG PO TABS
20.0000 mg | ORAL_TABLET | Freq: Every day | ORAL | Status: DC
  Administered 2016-01-25 – 2016-01-26 (×2): 20 mg via ORAL
  Filled 2016-01-24 (×2): qty 1

## 2016-01-24 MED ORDER — FERROUS SULFATE 325 (65 FE) MG PO TABS: 325.0000 mg | ORAL_TABLET | Freq: Three times a day (TID) | ORAL | Status: DC

## 2016-01-24 MED ORDER — DIPHENHYDRAMINE HCL 12.5 MG/5ML PO ELIX: 12.5000 mg | ORAL_SOLUTION | ORAL | Status: DC | PRN

## 2016-01-24 MED ORDER — ACETAMINOPHEN 500 MG PO TABS
1000.0000 mg | ORAL_TABLET | Freq: Four times a day (QID) | ORAL | Status: AC
  Administered 2016-01-24 – 2016-01-25 (×3): 1000 mg via ORAL
  Filled 2016-01-24 (×3): qty 2

## 2016-01-24 MED ORDER — DEXTROSE 5 % IV SOLN
500.0000 mg | Freq: Four times a day (QID) | INTRAVENOUS | Status: DC | PRN
  Filled 2016-01-24: qty 5

## 2016-01-24 MED ORDER — GABAPENTIN 300 MG PO CAPS
300.0000 mg | ORAL_CAPSULE | Freq: Three times a day (TID) | ORAL | Status: DC
  Administered 2016-01-24: 300 mg via ORAL
  Filled 2016-01-24: qty 1

## 2016-01-24 MED ORDER — ONDANSETRON HCL 4 MG PO TABS
4.0000 mg | ORAL_TABLET | Freq: Four times a day (QID) | ORAL | Status: DC | PRN
Start: 2016-01-24 — End: 2016-01-27

## 2016-01-24 MED ORDER — PHENYLEPHRINE HCL 10 MG/ML IJ SOLN
INTRAMUSCULAR | Status: DC | PRN
  Administered 2016-01-24: 40 ug via INTRAVENOUS

## 2016-01-24 MED ORDER — FENTANYL CITRATE (PF) 100 MCG/2ML IJ SOLN
INTRAMUSCULAR | Status: AC
  Filled 2016-01-24: qty 2

## 2016-01-24 MED ORDER — METHOCARBAMOL 500 MG PO TABS
500.0000 mg | ORAL_TABLET | Freq: Four times a day (QID) | ORAL | Status: DC | PRN
  Administered 2016-01-24: 500 mg via ORAL
  Filled 2016-01-24: qty 1

## 2016-01-24 MED ORDER — CEFAZOLIN SODIUM-DEXTROSE 2-4 GM/100ML-% IV SOLN: 2.0000 g | INTRAVENOUS | Status: DC

## 2016-01-24 MED ORDER — AMITRIPTYLINE HCL 50 MG PO TABS
75.0000 mg | ORAL_TABLET | Freq: Every day | ORAL | Status: DC
  Administered 2016-01-25 – 2016-01-26 (×2): 75 mg via ORAL
  Filled 2016-01-24 (×2): qty 1

## 2016-01-24 MED ORDER — TRANEXAMIC ACID 1000 MG/10ML IV SOLN
1000.0000 mg | INTRAVENOUS | Status: AC
  Administered 2016-01-24: 1000 mg via INTRAVENOUS
  Filled 2016-01-24: qty 10

## 2016-01-24 MED ORDER — FENTANYL CITRATE (PF) 100 MCG/2ML IJ SOLN
25.0000 ug | INTRAMUSCULAR | Status: DC | PRN
  Administered 2016-01-24: 25 ug via INTRAVENOUS
  Administered 2016-01-24: 50 ug via INTRAVENOUS

## 2016-01-24 MED ORDER — OXYCODONE HCL 5 MG PO TABS
ORAL_TABLET | ORAL | Status: AC
  Filled 2016-01-24: qty 2

## 2016-01-24 MED ORDER — CEFAZOLIN IN D5W 1 GM/50ML IV SOLN
1.0000 g | Freq: Once | INTRAVENOUS | Status: AC
  Administered 2016-01-24: 1 g via INTRAVENOUS
  Filled 2016-01-24: qty 50

## 2016-01-24 MED ORDER — CEFAZOLIN IN D5W 1 GM/50ML IV SOLN
1.0000 g | Freq: Four times a day (QID) | INTRAVENOUS | Status: DC
  Filled 2016-01-24 (×2): qty 50

## 2016-01-24 MED ORDER — ONDANSETRON HCL 4 MG/2ML IJ SOLN
INTRAMUSCULAR | Status: DC | PRN
  Administered 2016-01-24: 4 mg via INTRAVENOUS

## 2016-01-24 MED ORDER — BUPIVACAINE LIPOSOME 1.3 % IJ SUSP: 20.0000 mL | INTRAMUSCULAR | Status: DC

## 2016-01-24 MED ORDER — METOCLOPRAMIDE HCL 5 MG PO TABS: 5.0000 mg | ORAL_TABLET | Freq: Three times a day (TID) | ORAL | Status: DC | PRN

## 2016-01-24 MED ORDER — SUCCINYLCHOLINE CHLORIDE 20 MG/ML IJ SOLN
INTRAMUSCULAR | Status: DC | PRN
  Administered 2016-01-24: 120 mg via INTRAVENOUS

## 2016-01-24 SURGICAL SUPPLY — 65 items
BANDAGE ESMARK 6X9 LF (GAUZE/BANDAGES/DRESSINGS) ×1 IMPLANT
BENZOIN TINCTURE PRP APPL 2/3 (GAUZE/BANDAGES/DRESSINGS) IMPLANT
BLADE SAGITTAL 25.0X1.19X90 (BLADE) ×2 IMPLANT
BLADE SAW SAG 90X13X1.27 (BLADE) ×2 IMPLANT
BNDG ESMARK 6X9 LF (GAUZE/BANDAGES/DRESSINGS) ×2
BOWL SMART MIX CTS (DISPOSABLE) ×2 IMPLANT
CAP KNEE TOTAL 3 SIGMA ×2 IMPLANT
CEMENT HV SMART SET (Cement) ×4 IMPLANT
COVER SURGICAL LIGHT HANDLE (MISCELLANEOUS) ×2 IMPLANT
CUFF TOURNIQUET SINGLE 34IN LL (TOURNIQUET CUFF) ×2 IMPLANT
CUFF TOURNIQUET SINGLE 44IN (TOURNIQUET CUFF) IMPLANT
DRAPE HALF SHEET 40X57 (DRAPES) ×2 IMPLANT
DRAPE IMP U-DRAPE 54X76 (DRAPES) ×2 IMPLANT
DRAPE U-SHAPE 47X51 STRL (DRAPES) ×2 IMPLANT
DRSG AQUACEL AG ADV 3.5X10 (GAUZE/BANDAGES/DRESSINGS) ×2 IMPLANT
DRSG MEPILEX BORDER 4X12 (GAUZE/BANDAGES/DRESSINGS) IMPLANT
DRSG PAD ABDOMINAL 8X10 ST (GAUZE/BANDAGES/DRESSINGS) ×2 IMPLANT
DURAPREP 26ML APPLICATOR (WOUND CARE) ×4 IMPLANT
ELECT REM PT RETURN 9FT ADLT (ELECTROSURGICAL) ×2
ELECTRODE REM PT RTRN 9FT ADLT (ELECTROSURGICAL) ×1 IMPLANT
EVACUATOR 1/8 PVC DRAIN (DRAIN) ×2 IMPLANT
FACESHIELD WRAPAROUND (MASK) IMPLANT
GAUZE SPONGE 4X4 12PLY STRL (GAUZE/BANDAGES/DRESSINGS) ×2 IMPLANT
GLOVE BIOGEL PI IND STRL 8 (GLOVE) ×2 IMPLANT
GLOVE BIOGEL PI INDICATOR 8 (GLOVE) ×2
GLOVE ECLIPSE 7.5 STRL STRAW (GLOVE) ×4 IMPLANT
GOWN STRL REUS W/ TWL LRG LVL3 (GOWN DISPOSABLE) ×1 IMPLANT
GOWN STRL REUS W/ TWL XL LVL3 (GOWN DISPOSABLE) ×2 IMPLANT
GOWN STRL REUS W/TWL LRG LVL3 (GOWN DISPOSABLE) ×1
GOWN STRL REUS W/TWL XL LVL3 (GOWN DISPOSABLE) ×2
HANDPIECE INTERPULSE COAX TIP (DISPOSABLE) ×1
HOOD PEEL AWAY FACE SHEILD DIS (HOOD) ×4 IMPLANT
IMMOBILIZER KNEE 20 (SOFTGOODS) IMPLANT
IMMOBILIZER KNEE 22 UNIV (SOFTGOODS) ×2 IMPLANT
KIT BASIN OR (CUSTOM PROCEDURE TRAY) ×2 IMPLANT
KIT ROOM TURNOVER OR (KITS) ×2 IMPLANT
MANIFOLD NEPTUNE II (INSTRUMENTS) ×2 IMPLANT
NEEDLE 22X1 1/2 (OR ONLY) (NEEDLE) ×2 IMPLANT
NEEDLE SPNL 22GX3.5 QUINCKE BK (NEEDLE) ×2 IMPLANT
NS IRRIG 1000ML POUR BTL (IV SOLUTION) ×2 IMPLANT
PACK TOTAL JOINT (CUSTOM PROCEDURE TRAY) ×2 IMPLANT
PACK UNIVERSAL I (CUSTOM PROCEDURE TRAY) IMPLANT
PAD ARMBOARD 7.5X6 YLW CONV (MISCELLANEOUS) ×4 IMPLANT
PAD CAST 4YDX4 CTTN HI CHSV (CAST SUPPLIES) ×1 IMPLANT
PADDING CAST COTTON 4X4 STRL (CAST SUPPLIES) ×1
PADDING CAST COTTON 6X4 STRL (CAST SUPPLIES) ×2 IMPLANT
SET HNDPC FAN SPRY TIP SCT (DISPOSABLE) ×1 IMPLANT
STAPLER VISISTAT 35W (STAPLE) IMPLANT
STRIP CLOSURE SKIN 1/2X4 (GAUZE/BANDAGES/DRESSINGS) ×2 IMPLANT
SUCTION FRAZIER HANDLE 10FR (MISCELLANEOUS) ×1
SUCTION TUBE FRAZIER 10FR DISP (MISCELLANEOUS) ×1 IMPLANT
SUT MNCRL AB 3-0 PS2 18 (SUTURE) ×2 IMPLANT
SUT VIC AB 0 CT1 27 (SUTURE)
SUT VIC AB 0 CT1 27XBRD ANBCTR (SUTURE) IMPLANT
SUT VIC AB 0 CTB1 27 (SUTURE) ×2 IMPLANT
SUT VIC AB 1 CT1 27 (SUTURE) ×2
SUT VIC AB 1 CT1 27XBRD ANBCTR (SUTURE) ×2 IMPLANT
SUT VIC AB 2-0 CTB1 (SUTURE) ×4 IMPLANT
SYR 50ML LL SCALE MARK (SYRINGE) ×2 IMPLANT
TOWEL OR 17X24 6PK STRL BLUE (TOWEL DISPOSABLE) ×2 IMPLANT
TOWEL OR 17X26 10 PK STRL BLUE (TOWEL DISPOSABLE) ×2 IMPLANT
TRAY CATH 16FR W/PLASTIC CATH (SET/KITS/TRAYS/PACK) IMPLANT
TRAY FOLEY CATH 16FRSI W/METER (SET/KITS/TRAYS/PACK) IMPLANT
TRAY REVISION SZ 4 (Knees) ×2 IMPLANT
WRAP KNEE MAXI GEL POST OP (GAUZE/BANDAGES/DRESSINGS) ×2 IMPLANT

## 2016-01-24 NOTE — Progress Notes (Signed)
Orthopedic Tech Progress Note Patient Details:  Daniel Whitney December 10, 1955 [id number]  CPM Right Knee CPM Right Knee: On Right Knee Flexion (Degrees): 60 Right Knee Extension (Degrees): 0 Additional Comments: Trapeze bar and foot roll   [place] Pink 01/24/2016, 11:50 AM

## 2016-01-24 NOTE — Anesthesia Postprocedure Evaluation (Signed)
Anesthesia Post Note  Patient: Daniel Whitney  Procedure(s) Performed: Procedure(s) (LRB): TOTAL KNEE ARTHROPLASTY (Right)  Patient location during evaluation: PACU Anesthesia Type: General and Regional Level of consciousness: awake Vital Signs Assessment: post-procedure vital signs reviewed and stable Respiratory status: spontaneous breathing Cardiovascular status: stable Anesthetic complications: no    Last Vitals:  Vitals:   01/24/16 1325 01/24/16 1355  BP: (!) 141/84   Pulse: 84 81  Resp: 16 20  Temp:      Last Pain:  Vitals:   01/24/16 1355  TempSrc:   PainSc: Asleep                 Katrin Grabel

## 2016-01-24 NOTE — Anesthesia Procedure Notes (Signed)
Procedure Name: Intubation Date/Time: 01/24/2016 9:27 AM Performed by: Tressia Miners LEFFEW Pre-anesthesia Checklist: Patient identified, Patient being monitored, Timeout performed, Emergency Drugs available and Suction available Patient Re-evaluated:Patient Re-evaluated prior to inductionOxygen Delivery Method: Circle System Utilized Preoxygenation: Pre-oxygenation with 100% oxygen Intubation Type: IV induction Ventilation: Mask ventilation without difficulty and Oral airway inserted - appropriate to patient size Laryngoscope Size: Glidescope and 4 Grade View: Grade I Tube type: Oral Tube size: 7.5 mm Number of attempts: 1 Airway Equipment and Method: Stylet Placement Confirmation: ETT inserted through vocal cords under direct vision,  positive ETCO2 and breath sounds checked- equal and bilateral Secured at: 23 cm Tube secured with: Tape Dental Injury: Teeth and Oropharynx as per pre-operative assessment

## 2016-01-24 NOTE — Progress Notes (Signed)
ANTICOAGULATION CONSULT NOTE - Initial Consult  Pharmacy Consult:  Coumadin Indication: atrial fibrillation, secondary stroke prevention  Allergies  Allergen Reactions  . No Known Allergies     Patient Measurements: Height: 5\' 9"  (175.3 cm) Weight: 255 lb 8.2 oz (115.9 kg) IBW/kg (Calculated) : 70.7  Vital Signs: Temp: 97.5 F (36.4 C) (12/12 1125) Temp Source: Oral (12/12 0649) BP: 139/74 (12/12 1225) Pulse Rate: 85 (12/12 1225)  Labs:  Recent Labs  01/24/16 0713 01/24/16 0813  HGB 11.6*  --   HCT 34.0*  --   LABPROT  --  17.5*  INR  --  1.43    Estimated Creatinine Clearance: 12.8 mL/min (by C-G formula based on SCr of 7.62 mg/dL (H)).   Medical History: Past Medical History:  Diagnosis Date  . Allergic rhinitis 07/07/2014  . Anemia due to other cause 07/07/2014  . Arthritis    "all over"  . Atrial fibrillation ([place]) 12/30/2012  . CHF (congestive heart failure) ([place]) 12/30/2012  . Chronic lower back pain   . Diabetes mellitus with neuropathy ([place])   . Diabetic retinopathy ([place]) 12/30/2012   no medications  . Dysrhythmia    Afib  . ESRD (end stage renal disease) on dialysis ([place]) started in 2013   MWF; [facility]; [employer]  . GERD (gastroesophageal reflux disease)   . Glaucoma   . History of gout    "right big toe"  . Hyperlipidemia   . Hypertension   . Hypothyroid    01/17/16- no longer on meduication  . Morbid obesity ([place]) 12/30/2012  . Myocardial infarction    "I've had ~ 3; last one was in ~ 10/2013" (01/30/2014)  . Neuropathy ([place])   . OSA (obstructive sleep apnea) 12/30/2012   "lost weight; no longer needed CPAP; retested said I needed it; didn't followup cause I was feeling fine" (01/30/2014)  . Pulmonary hypertension 12/30/2012  . Refusal of blood transfusions as patient is Jehovah's Witness   . Stroke [facility]) ~ 2005; ~ 2005   "they were mild; I didn't even notice I'd had them"; denies residual on 01/30/2014      Assessment: 34 YOM  with history if Afib and CVA on Coumadin PTA.  Patient has not taken Coumadin since 01/21/16 for right TKA today 01/24/16.  Pharmacy consulted to resume Coumadin post-op, also for VTE prophylaxis.  INR sub-therapeutic as expected; no bleeding reported.   Goal of Therapy:  INR 2-3    Plan:  - Coumadin 3mg  PO today - Daily PT / INR   Jullien Granquist D. Mina Marble, PharmD, BCPS Pager:  [phone] 01/24/2016, 1:32 PM

## 2016-01-24 NOTE — Anesthesia Procedure Notes (Signed)
Anesthesia Regional Block:  Femoral nerve block  Pre-Anesthetic Checklist: ,, timeout performed, Correct Patient, Correct Site, Correct Laterality, Correct Procedure, Correct Position, site marked, Risks and benefits discussed,  Surgical consent,  Pre-op evaluation,  At surgeon's request and post-op pain management  Laterality: Right  Prep: chloraprep       Needles:   Needle Type: Stimulator Needle - 80          Additional Needles:  Procedures: Doppler guided Femoral nerve block  Nerve Stimulator or Paresthesia:  Response: 0.5 mA,   Additional Responses:   Narrative:  Start time: 01/24/2016 12:45 PM End time: 01/24/2016 1:00 PM Injection made incrementally with aspirations every 5 mL.  Performed by: Personally  Anesthesiologist: Candido Flott  Additional Notes: 20cc 0.5%marcaine w/epi 1:200,000 neg asp Patient tolerated procedure well

## 2016-01-24 NOTE — Transfer of Care (Signed)
Immediate Anesthesia Transfer of Care Note  Patient: Daniel Whitney  Procedure(s) Performed: Procedure(s): TOTAL KNEE ARTHROPLASTY (Right)  Patient Location: PACU  Anesthesia Type:General  Level of Consciousness: awake, alert , oriented, patient cooperative and responds to stimulation  Airway & Oxygen Therapy: Patient Spontanous Breathing and Patient connected to face mask oxygen  Post-op Assessment: Report given to RN, Post -op Vital signs reviewed and stable and Patient moving all extremities X 4  Post vital signs: Reviewed and stable  Last Vitals:  Vitals:   01/24/16 0649  BP: 98/68  Pulse: 96  Resp: 18  Temp: 37.1 C    Last Pain:  Vitals:   01/24/16 0649  TempSrc: Oral  PainSc: 7       Patients Stated Pain Goal: 2 (97/28/20 6015)  Complications: No apparent anesthesia complications

## 2016-01-24 NOTE — Brief Op Note (Signed)
01/24/2016  10:38 AM  PATIENT:  Daniel Whitney  60 y.o. male  PRE-OPERATIVE DIAGNOSIS:  Osteoarthritis Right knee  POST-OPERATIVE DIAGNOSIS:  Osteoarthritis Right knee  PROCEDURE:  Procedure(s): TOTAL KNEE ARTHROPLASTY (Right)  SURGEON:  Surgeon(s) and Role:    * Dorna Leitz, MD - Primary  PHYSICIAN ASSISTANT:   ASSISTANTS: April green NFA   ANESTHESIA:   general  EBL:  Total I/O In: -  Out: 100 [Blood:100]  BLOOD ADMINISTERED:none  DRAINS: none   LOCAL MEDICATIONS USED:  MARCAINE    and OTHER experel  SPECIMEN:  No Specimen  DISPOSITION OF SPECIMEN:  N/A  COUNTS:  YES  TOURNIQUET:   Total Tourniquet Time Documented: Thigh (Right) - 49 minutes Total: Thigh (Right) - 49 minutes   DICTATION: .Other Dictation: Dictation Number [id number]  PLAN OF CARE: Admit to inpatient   PATIENT DISPOSITION:  PACU - hemodynamically stable.   Delay start of Pharmacological VTE agent (>24hrs) due to surgical blood loss or risk of bleeding: no

## 2016-01-24 NOTE — Progress Notes (Signed)
Orthopedic Tech Progress Note Patient Details:  Daniel Whitney 08-12-1955 [id number]  CPM Right Knee CPM Right Knee: On Right Knee Flexion (Degrees): 60 Right Knee Extension (Degrees): 0 Additional Comments: Trapeze bar and foot roll   [place] Pink 01/24/2016, 6:19 PM

## 2016-01-24 NOTE — Evaluation (Signed)
Physical Therapy Evaluation Patient Details Name: Daniel Whitney MRN: [id number] DOB: 1955-06-19 Today's Date: 01/24/2016   History of Present Illness  60 y.o. male admitted to [facility] on 01/24/16 for elective R TKA.  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  Per pt self report R RTC tear (not listed in chart) and very bad left knee arthritis.    Clinical Impression  Pt is POD #0 and requires mod assist from an elevated bed to transfer to a chair.  He will likely be two person assist to get out of the chair later as he has a bad R shoulder and significant arthritis in his left knee.  He does not have help at home during the day as his primary caregiver (his brother) works.  He will likely need SNF placement at discharge in order to get prolonged therapy to make him as independent as possible before he returns home alone during the day.   PT to follow acutely for deficits listed below.       Follow Up Recommendations SNF;Supervision/Assistance - 24 hour    Equipment Recommendations  3in1 (PT)    Recommendations for Other Services   NA     Precautions / Restrictions Precautions Precautions: Knee;Fall Precaution Booklet Issued: Yes (comment) Precaution Comments: knee exercise handout given Required Braces or Orthoses: Knee Immobilizer - Right Restrictions Weight Bearing Restrictions:  (none ordered)      Mobility  Bed Mobility Overal bed mobility: Needs Assistance Bed Mobility: Supine to Sit     Supine to sit: HOB elevated;Mod assist     General bed mobility comments: Mod assist to support trunk and help progress right leg over EOB, pt using HOB elevated, trapeeze and bed rails for support to transition trunk.   Transfers Overall transfer level:  Needs assistance Equipment used: Rolling walker (2 wheeled) Transfers: Sit to/from [facility] Sit to Stand: From elevated surface;Mod assist Stand pivot transfers: From elevated surface;Mod assist       General transfer comment: Mod assist from elevated bed to lower recliner chair to support trunk during transition to power up to stand. Bed had to be elevated to get to standing.  May take two people to get him out of lower recliner chair.  Mod assist to stand and pivot with RW to recliner from elevated bed.  Verbal cues for LE sequencing.          Balance Overall balance assessment: Needs assistance Sitting-balance support: Feet supported;Bilateral upper extremity supported Sitting balance-Leahy Scale: Fair     Standing balance support: Bilateral upper extremity supported Standing balance-Leahy Scale: Poor                               Pertinent Vitals/Pain Pain Assessment: 0-10 Pain Score: 5  Pain Location: right knee Pain Descriptors / Indicators: Aching;Burning;Grimacing;Guarding Pain Intervention(s): Limited activity within patient's tolerance;Monitored during session;Repositioned    Home Living Family/patient expects to be discharged to:: Private residence Living Arrangements: Other relatives (brother) Available Help at Discharge: Family;Available PRN/intermittently (brother works 11 am- 7 pm) Type of Home: House Home Access: Stairs to enter Entrance Stairs-Rails: Left Entrance Stairs-Number of Steps: 5 Home Layout: One level Home Equipment: Environmental consultant - 4 wheels;Cane - quad;Shower  seat               Extremity/Trunk Assessment   Upper Extremity Assessment: Defer to OT evaluation           Lower Extremity Assessment: RLE deficits/detail;LLE deficits/detail RLE Deficits / Details: pt with normal post op pain and weakness.  Ankle at least 3/5, knee 2/5, hip flexion 2+/5 LLE Deficits / Details: left leg per pt report has bad OA as  well, varus deformity and enlarged joint.    Cervical / Trunk Assessment: Normal  Communication   Communication: No difficulties  Cognition Arousal/Alertness: Lethargic;Suspect due to medications Behavior During Therapy: [facility] for tasks assessed/performed Overall Cognitive Status: Within Functional Limits for tasks assessed                         Exercises Total Joint Exercises Ankle Circles/Pumps: AROM;Both;20 reps Other Exercises Other Exercises: IS x 10 reps 1500 mL max inhaled volume   Assessment/Plan    PT Assessment Patient needs continued PT services  PT Problem List Decreased strength;Decreased activity tolerance;Decreased range of motion;Decreased balance;Decreased mobility;Decreased knowledge of use of DME;Decreased knowledge of precautions;Pain;Obesity;Impaired sensation          PT Treatment Interventions DME instruction;Gait training;Stair training;Functional mobility training;Therapeutic activities;Therapeutic exercise;Balance training;Patient/family education;Manual techniques;Modalities    PT Goals (Current goals can be found in the Care Plan section)  Acute Rehab PT Goals Patient Stated Goal: he is open to rehab before going home.  PT Goal Formulation: With patient Time For Goal Achievement: 01/31/16 Potential to Achieve Goals: Good    Frequency 7X/week   Barriers to discharge Decreased caregiver support pt's brother works 11 am -7 pm and he will be unassited during that time.        End of Session Equipment Utilized During Treatment: Gait belt;Right knee immobilizer Activity Tolerance: Patient limited by pain;Patient limited by lethargy Patient left: in chair;with call bell/phone within reach Nurse Communication: Mobility status         Time: 9622-2979 PT Time Calculation (min) (ACUTE ONLY): 25 min   Charges:   PT Evaluation $PT Eval Moderate Complexity: 1 Procedure PT Treatments $Therapeutic Activity: 8-22 mins        Yemariam Magar B.  Vinita Prentiss, PT, DPT [phone]   01/24/2016, 4:54 PM

## 2016-01-24 NOTE — Consult Note (Signed)
[facility] Renal Consultation Note    Indication for Consultation:  Management of ESRD/hemodialysis; anemia, hypertension/volume and secondary hyperparathyroidism PCP:  HPI: Daniel Whitney is a very pleasant  60 y.o. male with ESRD 2/2 HTN on hemodialysis since 04/25/12. PMH significant for HTN, HF, cardiac arrest X 2 associated with missed HD, DMT2, nonobstructive CAD, MI, CVA, OSA (no Bipap) anemia of chronic disease (Pt is [name]-NO BLOOD TRANSFUSIONS), SHPT, Afib on coumadin, pulmonary hypertension, osteoarthritis, gout, morbid obesity.   Patient was admitted today for scheduled R. Total knee arthroplasty per Dr. Berenice Primas. Patient has been experiencing pain/disability in R knee D/T osteoarthritis which failed to respond to conservative measures. No active infection R. Knee. Surgery was done this AM. He tolerated surgery well and is currently sitting up in chair without C/O pain. He has no complaints, no other issues at this time.   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 He moved to [place] after retirement in 2014 to be closer to family. He does well with HD, frequently travels between [place] and [place]. He largest issue with HD is high IDWG. Very pleasant, always cooperative.   Past Medical History:  Diagnosis Date  . Allergic rhinitis 07/07/2014  . Anemia due to other cause 07/07/2014  . Arthritis    "all over"  . Atrial fibrillation ([place]) 12/30/2012  . CHF (congestive heart failure) ([place]) 12/30/2012  . Chronic lower back pain   . Diabetes mellitus with neuropathy ([place])   . Diabetic retinopathy ([place]) 12/30/2012   no medications  . Dysrhythmia    Afib  . ESRD (end stage renal disease) on dialysis ([place]) started in 2013   MWF; [facility]; [facility]  . GERD (gastroesophageal reflux disease)   . Glaucoma   . History of gout    "right big toe"  . Hyperlipidemia   . Hypertension   . Hypothyroid    01/17/16- no longer on meduication  . Morbid  obesity ([place]) 12/30/2012  . Myocardial infarction    "I've had ~ 3; last one was in ~ 10/2013" (01/30/2014)  . Neuropathy ([place])   . OSA (obstructive sleep apnea) 12/30/2012   "lost weight; no longer needed CPAP; retested said I needed it; didn't followup cause I was feeling fine" (01/30/2014)  . Pulmonary hypertension 12/30/2012  . Refusal of blood transfusions as patient is Jehovah's Witness   . Stroke [facility]) ~ 2005; ~ 2005   "they were mild; I didn't even notice I'd had them"; denies residual on 01/30/2014   Past Surgical History:  Procedure Laterality Date  . AV FISTULA PLACEMENT Left ~ 10/2013   "forearm"  . CARDIAC CATHETERIZATION    . CARDIAC CATHETERIZATION N/A 01/07/2015   Procedure: Left Heart Cath and Coronary Angiography;  Surgeon: Troy Sine, MD;  Location: [place] [facility];  Service: Cardiovascular;  Laterality: N/A;  . CLOSED REDUCTION HIP DISLOCATION Right 1970's  . TOTAL HIP ARTHROPLASTY Right 1980   Family History  Problem Relation Age of Onset  . Arthritis Other   . Heart disease Other   . Hyperlipidemia Other   . Hypertension Other   . Kidney disease Other   . Diabetes Other   . Stroke Other    Social History:  reports that he has never smoked. He has never used smokeless tobacco. He reports that he drinks about 4.2 - 4.8 oz of alcohol per week . He reports that he does not use drugs. Allergies  Allergen Reactions  . No Known Allergies  Prior to Admission medications   Medication Sig Start Date End Date Taking? Authorizing Provider  acetaminophen (TYLENOL) 325 MG tablet Take 325 mg by mouth 2 (two) times daily as needed (for pain.).   Yes Historical Provider, MD  acetaminophen (TYLENOL) 650 MG CR tablet Take 650 mg by mouth every 8 (eight) hours as needed for pain.   Yes Historical Provider, MD  allopurinol (ZYLOPRIM) 100 MG tablet Take 1 tablet (100 mg total) by mouth 2 (two) times daily. 01/08/15  Yes Costin Karlyne Greenspan, MD  amitriptyline (ELAVIL) 50  MG tablet Take 50 mg by mouth at bedtime.   Yes Historical Provider, MD  Blood Glucose Monitoring Suppl (ONE TOUCH ULTRA 2) W/DEVICE KIT Use to check blood sugars twice a day Dx E11.9 10/29/14  Yes Biagio Borg, MD  carvedilol (COREG) 25 MG tablet Take 25 mg by mouth as directed. Take 1 tablet (25 MG) in the morning ONLY on Sunday, Tuesday, & Thursday Take 1 tablet (25 MG) twice daily on Monday, Wednesday, Friday, & Saturday   Yes Historical Provider, MD  dextromethorphan-guaiFENesin (MUCINEX DM) 30-600 MG 12hr tablet Take 1 tablet by mouth 2 (two) times daily.   Yes Historical Provider, MD  glucose blood (ONE TOUCH ULTRA TEST) test strip 1 each by Other route 2 (two) times daily. Use to check blood sugars twice a day Dx E11.9 01/08/15  Yes Costin Karlyne Greenspan, MD  HYDROcodone-acetaminophen (NORCO/VICODIN) 5-325 MG tablet Take 1 tablet by mouth 2 (two) times daily.   Yes Historical Provider, MD  Lancets [facility] ULTRASOFT) lancets 1 each by Other route 2 (two) times daily. Use to help check blood sugars twice a day Dx E11.9 10/29/14  Yes Biagio Borg, MD  Multiple Vitamin (DAILY VITE PO) Take 1 tablet by mouth daily.   Yes Historical Provider, MD  sevelamer carbonate (RENVELA) 800 MG tablet Take 1,600 mg by mouth 3 (three) times daily with meals.    Yes Historical Provider, MD  simvastatin (ZOCOR) 20 MG tablet TAKE ONE TABLET BY MOUTH ONCE DAILY 08/06/14  Yes Biagio Borg, MD  sodium chloride (OCEAN) 0.65 % SOLN nasal spray Place 1 spray into both nostrils as needed for congestion. 01/31/14  Yes Shanker Kristeen Mans, MD  warfarin (COUMADIN) 3 MG tablet Please take as directed by anticoagulation clinic. Patient taking differently: Take 3-4.5 mg by mouth daily at 6 PM. Take 1.5 tablet (4.5 mg) on Thursday & 1 tablet (3 mg) on all other days. 10/11/14  Yes Biagio Borg, MD  allopurinol (ZYLOPRIM) 100 MG tablet TAKE ONE TABLET BY MOUTH TWICE DAILY Patient not taking: Reported on 01/16/2016 01/19/15   Lyndal Pulley, DO  amitriptyline (ELAVIL) 75 MG tablet Take 1 tablet (75 mg total) by mouth at bedtime. Patient not taking: Reported on 01/16/2016 02/02/15   Dene Gentry, MD  loperamide (IMODIUM A-D) 2 MG tablet Take 2 mg by mouth 4 (four) times daily as needed for diarrhea or loose stools.    Historical Provider, MD  ranitidine (ZANTAC) 150 MG tablet Take 150 mg by mouth daily as needed (heartburn/acid reflux.). Reported on 02/03/2015    Historical Provider, MD   Current Facility-Administered Medications  Medication Dose Route Frequency Provider Last Rate Last Dose  . acetaminophen (TYLENOL) tablet 650 mg  650 mg Oral Q6H PRN Dorna Leitz, MD       Or  . acetaminophen (TYLENOL) suppository 650 mg  650 mg Rectal Q6H PRN Dorna Leitz, MD      .  acetaminophen (TYLENOL) tablet 1,000 mg  1,000 mg Oral Q6H Dorna Leitz, MD   1,000 mg at 01/24/16 1521  . allopurinol (ZYLOPRIM) tablet 100 mg  100 mg Oral BID Valentina Gu, NP      . amitriptyline (ELAVIL) tablet 75 mg  75 mg Oral QHS Valentina Gu, NP      . bisacodyl (DULCOLAX) suppository 10 mg  10 mg Rectal Daily PRN Dorna Leitz, MD      . carvedilol (COREG) tablet 25 mg  25 mg Oral BID WC Valentina Gu, NP      . diphenhydrAMINE (BENADRYL) 12.5 MG/5ML elixir 12.5-25 mg  12.5-25 mg Oral Q8H PRN Valentina Gu, NP      . docusate sodium (COLACE) capsule 100 mg  100 mg Oral BID Dorna Leitz, MD      . fentaNYL (SUBLIMAZE) 100 MCG/2ML injection           . gabapentin (NEURONTIN) capsule 100 mg  100 mg Oral TID Valentina Gu, NP      . HYDROcodone-acetaminophen [facility]) 7.5-325 MG per tablet 1 tablet  1 tablet Oral Q6H Dorna Leitz, MD   1 tablet at 01/24/16 1732  . HYDROmorphone (DILAUDID) injection 1 mg  1 mg Intravenous Q2H PRN Dorna Leitz, MD   1 mg at 01/24/16 1901  . menthol-cetylpyridinium (CEPACOL) lozenge 3 mg  1 lozenge Oral PRN Dorna Leitz, MD       Or  . phenol (CHLORASEPTIC) mouth spray 1 spray  1 spray Mouth/Throat PRN  Dorna Leitz, MD      . methocarbamol (ROBAXIN) 500 MG tablet           . methocarbamol (ROBAXIN) tablet 500 mg  500 mg Oral Q6H PRN Dorna Leitz, MD   500 mg at 01/24/16 1240  . metoCLOPramide (REGLAN) tablet 5 mg  5 mg Oral Q8H PRN Dorna Leitz, MD       Or  . metoCLOPramide (REGLAN) injection 5 mg  5 mg Intravenous Q8H PRN Dorna Leitz, MD      . multivitamin (RENA-VIT) tablet 1 tablet  1 tablet Oral QHS Valentina Gu, NP      . ondansetron [facility]) tablet 4 mg  4 mg Oral Q6H PRN Dorna Leitz, MD       Or  . ondansetron (ZOFRAN) injection 4 mg  4 mg Intravenous Q6H PRN Dorna Leitz, MD      . oxyCODONE (Oxy IR/ROXICODONE) 5 MG immediate release tablet           . oxyCODONE (Oxy IR/ROXICODONE) immediate release tablet 5-10 mg  5-10 mg Oral Q3H PRN Dorna Leitz, MD   10 mg at 01/24/16 1749  . [START ON 01/25/2016] sevelamer carbonate (RENVELA) tablet 2,400 mg  2,400 mg Oral TID WC Valentina Gu, NP      . sevelamer carbonate (RENVELA) tablet 800 mg  800 mg Oral With snacks Valentina Gu, NP   800 mg at 01/24/16 2023  . simvastatin (ZOCOR) tablet 20 mg  20 mg Oral q1800 Valentina Gu, NP      . sodium chloride flush (NS) 0.9 % injection 10-40 mL  10-40 mL Intracatheter PRN Dorna Leitz, MD      . Warfarin - Pharmacist Dosing Inpatient   Does not apply q1800 Tyrone Apple, [facility]       Labs: Basic Metabolic Panel:  Recent Labs Lab 01/24/16 0713  NA 134*  K 4.1  GLUCOSE 111*   CBC:  Recent Labs Lab 01/24/16 0713  HGB 11.6*  HCT 34.0*   CBG:  Recent Labs Lab 01/24/16 1129  GLUCAP 189*   Studies/Results: Dg Chest Port 1 View  Result Date: 01/24/2016 CLINICAL DATA:  Status post central line placement. EXAM: PORTABLE CHEST 1 VIEW COMPARISON:  01/17/2016 FINDINGS: Status post placement of left jugular central line with the catheter tip in the upper SVC. No pneumothorax. Lung volumes are low with bibasilar atelectasis present. No overt edema or pleural fluid  identified. The heart size is within normal limits. Stable tortuosity of the thoracic aorta. IMPRESSION: Newly placed left jugular central line with catheter tip in upper SVC. No pneumothorax identified. Electronically Signed   By: Aletta Edouard M.D.   On: 01/24/2016 14:28    ROS: As per HPI otherwise negative.    Physical Exam: Vitals:   01/24/16 1500 01/24/16 1749 01/24/16 1855 01/24/16 1900  BP: (!) 141/74   (!) 151/81  Pulse: 84   83  Resp: 18   18  Temp: 97.5 F (36.4 C)   98.4 F (36.9 C)  TempSrc: Oral   Oral  SpO2: 100% 99% (!) 89% 90%  Weight:      Height:         General: Well developed, well nourished, in no acute distress. Head: Normocephalic, atraumatic, sclera non-icteric, mucus membranes are moist Neck: Supple. JVD not elevated. Lungs: Clear bilaterally to auscultation without wheezes, rales, or rhonchi. Breathing is unlabored. Heart: Regularly irregular. S1,S2. No M/R/G.   Abdomen: Soft, non-tender, non-distended with normoactive bowel sounds. No rebound/guarding. No obvious abdominal masses. M-S:  Strength and tone appear normal for age. Lower extremities: Knee immobilizer RLE. No edema LLE.  Neuro: Alert and oriented X 3. Moves all extremities spontaneously. Psych:  Responds to questions appropriately with a normal affect. Dialysis Access: LFA AVF + bruit.   Dialysis Orders: [facility] MWF 4 hours 450/800 111.5 kg 2.0 K/2.5 Ca Linear Na Heparin 3000 units IV q treatment Hectoral 1 mcg IV q tx (Last PTH 285 12/07/15) Recent Labs: HGB 11.6 (01/18/16) Phos 4.5 Ca 9.5 C Ca 9.6 Albumin 3.9 (01/03/16)  BMD medications: Renvela 800 mg 3 tablets PO TID w/meals 1 tablet with snack.  Assessment/Plan: 1 Osteoarthritis R Knee/Total knee Arthoplasty: Per Dr. Berenice Primas. Pt stable post op.  2. ESRD -MWF via LFA AVF. K+ 4.1 today. Check renal  panel prior to HD tomorrow. No heparin.  3. Anemia - HGB 11.6. No OP ESA. Follow HGB. PT IS [name]-NO BLOOD PRODUCTS! 4.  Secondary hyperparathyroidism - cont binders/VDRA.  5. HTN/volume - Last HD 01/23/16 Had high gain 8.1, left HD 2.8 above EDW. Wt today 115.9 kg. Attempt UFG 4-4.5 liters. Patient has no edema-usually gets to within 0.5 of EDW despite high IDWG. Patient takes coreg 25 mg PO BID. This has been resumed. 6. Nutrition -Renal diet/Fld restrictions reinforced. Renal vit/nepro. 7. H/O Afib: On coumadin per pharmacy 8. H/O Gout: Allopurinol resumed.  Rita H. Owens Shark, NP-C 01/24/2016, 9:45 PM  [employer] [phone]  Pt seen, examined and agree w A/P as above.  Kelly Splinter MD [employer] pager [phone]   01/25/2016, 7:48 AM

## 2016-01-25 ENCOUNTER — Encounter (HOSPITAL_COMMUNITY): Payer: Self-pay | Admitting: Orthopedic Surgery

## 2016-01-25 LAB — RENAL FUNCTION PANEL
Albumin: 3.2 g/dL — ABNORMAL LOW (ref 3.5–5.0)
Anion gap: 18 — ABNORMAL HIGH (ref 5–15)
BUN: 50 mg/dL — ABNORMAL HIGH (ref 6–20)
CO2: 21 mmol/L — ABNORMAL LOW (ref 22–32)
Calcium: 9.2 mg/dL (ref 8.9–10.3)
Chloride: 94 mmol/L — ABNORMAL LOW (ref 101–111)
Creatinine, Ser: 9.53 mg/dL — ABNORMAL HIGH (ref 0.61–1.24)
GFR calc Af Amer: 6 mL/min — ABNORMAL LOW (ref >60)
GFR calc non Af Amer: 5 mL/min — ABNORMAL LOW (ref >60)
Glucose, Bld: 145 mg/dL — ABNORMAL HIGH (ref 65–99)
Phosphorus: 8.2 mg/dL — ABNORMAL HIGH (ref 2.5–4.6)
Potassium: 5.6 mmol/L — ABNORMAL HIGH (ref 3.5–5.1)
Sodium: 133 mmol/L — ABNORMAL LOW (ref 135–145)

## 2016-01-25 LAB — BASIC METABOLIC PANEL
Anion gap: 18 — ABNORMAL HIGH (ref 5–15)
BUN: 49 mg/dL — AB (ref 6–20)
CALCIUM: 9.3 mg/dL (ref 8.9–10.3)
CHLORIDE: 95 mmol/L — AB (ref 101–111)
CO2: 21 mmol/L — AB (ref 22–32)
CREATININE: 9.53 mg/dL — AB (ref 0.61–1.24)
GFR calc non Af Amer: 5 mL/min — ABNORMAL LOW (ref >60)
GFR, EST AFRICAN AMERICAN: 6 mL/min — AB (ref >60)
Glucose, Bld: 145 mg/dL — ABNORMAL HIGH (ref 65–99)
Potassium: 5.6 mmol/L — ABNORMAL HIGH (ref 3.5–5.1)
SODIUM: 134 mmol/L — AB (ref 135–145)

## 2016-01-25 LAB — CBC
HEMATOCRIT: 34.7 % — AB (ref 39.0–52.0)
Hemoglobin: 11.2 g/dL — ABNORMAL LOW (ref 13.0–17.0)
MCH: 31.1 pg (ref 26.0–34.0)
MCHC: 32.3 g/dL (ref 30.0–36.0)
MCV: 96.4 fL (ref 78.0–100.0)
Platelets: 185 10*3/uL (ref 150–400)
RBC: 3.6 MIL/uL — ABNORMAL LOW (ref 4.22–5.81)
RDW: 14.5 % (ref 11.5–15.5)
WBC: 12.8 10*3/uL — ABNORMAL HIGH (ref 4.0–10.5)

## 2016-01-25 LAB — PROTIME-INR
INR: 1.56
PROTHROMBIN TIME: 18.8 s — AB (ref 11.4–15.2)

## 2016-01-25 MED ORDER — WARFARIN SODIUM 5 MG PO TABS
5.0000 mg | ORAL_TABLET | Freq: Once | ORAL | Status: AC
  Administered 2016-01-25: 5 mg via ORAL
  Filled 2016-01-25: qty 1

## 2016-01-25 MED ORDER — OXYCODONE-ACETAMINOPHEN 5-325 MG PO TABS: 1.0000 | ORAL_TABLET | Freq: Four times a day (QID) | ORAL | 0 refills | Status: DC | PRN

## 2016-01-25 MED ORDER — TIZANIDINE HCL 2 MG PO TABS: 2.0000 mg | ORAL_TABLET | Freq: Three times a day (TID) | ORAL | 0 refills | Status: DC | PRN

## 2016-01-25 MED ORDER — CEFAZOLIN IN D5W 1 GM/50ML IV SOLN
1.0000 g | Freq: Two times a day (BID) | INTRAVENOUS | Status: AC
  Administered 2016-01-25 – 2016-01-26 (×2): 1 g via INTRAVENOUS
  Filled 2016-01-25 (×3): qty 50

## 2016-01-25 NOTE — Progress Notes (Signed)
  [facility] Progress Note   Subjective: no /co  Vitals:   01/24/16 1855 01/24/16 1900 01/24/16 2310 01/25/16 0535  BP:  (!) 151/81 (!) 163/82 112/87  Pulse:  83 91 99  Resp:  18 20 20   Temp:  98.4 F (36.9 C) 98 F (36.7 C) 98.1 F (36.7 C)  TempSrc:  Oral Oral Oral  SpO2: (!) 89% 90% 100% 99%  Weight:      Height:        Inpatient medications: . acetaminophen  1,000 mg Oral Q6H  . allopurinol  100 mg Oral BID  . amitriptyline  75 mg Oral QHS  . carvedilol  25 mg Oral BID WC  .  ceFAZolin (ANCEF) IV  1 g Intravenous Q12H  . docusate sodium  100 mg Oral BID  . doxercalciferol  1 mcg Intravenous Q M,W,F-HD  . gabapentin  100 mg Oral TID  . HYDROcodone-acetaminophen  1 tablet Oral Q6H  . multivitamin  1 tablet Oral QHS  . sevelamer carbonate  2,400 mg Oral TID WC  . sevelamer carbonate  800 mg Oral With snacks  . simvastatin  20 mg Oral q1800  . warfarin  5 mg Oral ONCE-1800  . Warfarin - Pharmacist Dosing Inpatient   Does not apply q1800    acetaminophen **OR** acetaminophen, bisacodyl, diphenhydrAMINE, HYDROmorphone (DILAUDID) injection, menthol-cetylpyridinium **OR** phenol, methocarbamol **OR** [DISCONTINUED] methocarbamol (ROBAXIN)  IV, metoCLOPramide **OR** metoCLOPramide (REGLAN) injection, ondansetron **OR** ondansetron (ZOFRAN) IV, oxyCODONE, sodium chloride flush  Exam: No distress, calm No jvd Chest clear bilat RRR no mrg Abd obese soft ntnd Ext R knee immobilizer, no LE edema NF, ox3 LFA AVF +bruit  D[facility]: [facility]  4h  111.5kg   Hep 3000  LFA AVF Hect 1 ug tiw Lab: Hb 11.6  P 4.5 Ca 9.5        Assessment: 1. DJD R Knee, sp R TKA 12/12 2. ESRD HD mwf 3. Vol is up 3-4kg 4. Obesity 5. Anemia stable Hb, no transfusion (JW) 6. AFib - anticoag per pharm  Plan - HD today   Kelly Splinter MD [facility] pager [phone]   01/25/2016, 11:34 AM    Recent Labs Lab 01/24/16 0713 01/25/16 0347  NA 134* 134*   133*  K 4.1 5.6*  5.6*  CL  --  95*  94*  CO2  --  21*  21*  GLUCOSE 111* 145*  145*  BUN  --  49*  50*  CREATININE  --  9.53*  9.53*  CALCIUM  --  9.3  9.2  PHOS  --  8.2*    Recent Labs Lab 01/25/16 0347  ALBUMIN 3.2*    Recent Labs Lab 01/24/16 0713 01/25/16 0347  WBC  --  12.8*  HGB 11.6* 11.2*  HCT 34.0* 34.7*  MCV  --  96.4  PLT  --  185   Iron/TIBC/Ferritin/ %Sat No results found for: IRON, TIBC, FERRITIN, IRONPCTSAT

## 2016-01-25 NOTE — Clinical Social Work Note (Signed)
CSW went into pt room to discuss placement with pt however pt sound asleep. CSW will try again at another time.  [address], [place]

## 2016-01-25 NOTE — Progress Notes (Signed)
ANTICOAGULATION CONSULT NOTE - Initial Consult  Pharmacy Consult:  Coumadin Indication: atrial fibrillation, secondary stroke prevention  Allergies  Allergen Reactions  . No Known Allergies     Patient Measurements: Height: 5\' 9"  (175.3 cm) Weight: 255 lb 8.2 oz (115.9 kg) IBW/kg (Calculated) : 70.7  Vital Signs: Temp: 98.1 F (36.7 C) (12/13 0535) Temp Source: Oral (12/13 0535) BP: 112/87 (12/13 0535) Pulse Rate: 99 (12/13 0535)  Labs:  Recent Labs  01/24/16 0713 01/24/16 0813 01/25/16 0347  HGB 11.6*  --  11.2*  HCT 34.0*  --  34.7*  PLT  --   --  185  LABPROT  --  17.5* 18.8*  INR  --  1.43 1.56  CREATININE  --   --  9.53*  9.53*    Estimated Creatinine Clearance: 10.2 mL/min (by C-G formula based on SCr of 9.53 mg/dL (H)).  Assessment: 60 yo m presenting with right knee pain - admission for knee replacement  PMH: Afib, HF, ESRD HD, HTN, DM  Anticoag: warfarin for Afib and VTE px s/p right TKA 12/12 - Admit INR 1.43 INR today 1.56  *home dose: 3mg  daily except 4.5mg  on Thurs, last dose 12/9  Nephro: ESRD on HD MWF  Heme/Onc: H&H 11.2/34.7, Plt 185  Goal of Therapy:  INR 2-3   Plan:  - Coumadin 5mg  PO today - Daily PT / INR - CBC q72h  Levester Fresh, PharmD, BCPS, BCCCP Clinical Pharmacist Clinical phone for 01/25/2016 from 7a-3:30p: [phone] If after 3:30p, please call main pharmacy at: [phone] 01/25/2016 8:55 AM

## 2016-01-25 NOTE — Clinical Social Work Placement (Addendum)
   CLINICAL SOCIAL WORK PLACEMENT  NOTE  Date:  01/25/2016  Patient Details  Name: Daniel Whitney MRN: [id number] Date of Birth: 11/09/1955  Clinical Social Work is seeking post-discharge placement for this patient at the [place] level of care (*CSW will initial, date and re-position this form in  chart as items are completed):      Patient/family provided with [facility] Work Department's list of facilities offering this level of care within the geographic area requested by the patient (or if unable, by the patient's family).      Patient/family informed of their freedom to choose among providers that offer the needed level of care, that participate in Medicare, Medicaid or managed care program needed by the patient, have an available bed and are willing to accept the patient.      Patient/family informed of [facility]'s ownership interest in [facility] and [facility], as well as of the fact that they are under no obligation to receive care at these facilities.  PASRR submitted to EDS on       PASRR number received on 01/25/16     Existing PASRR number confirmed on       FL2 transmitted to all facilities in geographic area requested by pt/family on 01/25/16     FL2 transmitted to all facilities within larger geographic area on       Patient informed that his/her managed care company has contracts with or will negotiate with certain facilities, including the following:            Patient/family informed of bed offers received.  Patient chooses bed at [facility]     Physician recommends and patient chooses bed at      Patient to be transferred to [facility] on 01/27/16.  Patient to be transferred to facility by [facility]     Patient family notified on 01/27/16 of transfer.  Name of family member notified:  Richardson Landry, brother     PHYSICIAN Please prepare priority discharge summary, including medications, Please prepare prescriptions      Additional Comment:    _______________________________________________ Alla German, LCSW 01/25/2016, 4:22 PM

## 2016-01-25 NOTE — Progress Notes (Signed)
Orthopedic Tech Progress Note Patient Details:  Daniel Whitney 02-07-1956 [id number]  Patient ID: Daniel Whitney, male   DOB: 1955-10-03, 60 y.o.   MRN: [id number] Pt already up in chair.   Karolee Stamps 01/25/2016, 6:05 AM

## 2016-01-25 NOTE — Progress Notes (Signed)
Physical Therapy Treatment Patient Details Name: Daniel Whitney MRN: [id number] DOB: May 19, 1955 Today's Date: 01/25/2016    History of Present Illness 60 y.o. male admitted to [facility] on 01/24/16 for elective R TKA.  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  Per pt self report R RTC tear (not listed in chart) and very bad left knee arthritis.      PT Comments    Pt found up in chair upon arrival, pt unable to recall how long he had be up. Nursing reports pt found in chair at start of shift. Initially pt difficult to arouse as sleeping heavily. Once aroused pt participating with PT session but having mild confusion with repeated cues needed throughout session. Pt requiring +2 max assist for sit<>stand transfers and +2 total assist for sit to supine. Pt unable to ambulate during session. Continuing to recommend SNF for further rehabilitation following acute stay.   Follow Up Recommendations  SNF;Supervision/Assistance - 24 hour     Equipment Recommendations  None recommended by PT (to be addressed at next venue)    Recommendations for Other Services       Precautions / Restrictions Precautions Precautions: Knee;Fall Required Braces or Orthoses: Knee Immobilizer - Right Knee Immobilizer - Right: Other (comment) (D/C once has strong SLR) Restrictions Weight Bearing Restrictions: Yes RLE Weight Bearing: Weight bearing as tolerated    Mobility  Bed Mobility Overal bed mobility: Needs Assistance Bed Mobility: Sit to Supine       Sit to supine: +2 for physical assistance;Total assist      Transfers Overall transfer level: Needs assistance Equipment used: Rolling walker (2 wheeled) Transfers: Sit to/from Stand Sit to Stand: +2 physical assistance;Max  assist         General transfer comment: unable to perform stand-pivot transfers safely. Stood from chair X2 and bed brought from behind for transfer to bed.   Ambulation/Gait             General Gait Details: pt unable to take steps forward, able to take 1 step backward with +2 assist.    Stairs            Wheelchair Mobility    Modified Rankin (Stroke Patients Only)       Balance Overall balance assessment: Needs assistance Sitting-balance support: Bilateral upper extremity supported Sitting balance-Leahy Scale: Fair     Standing balance support: Bilateral upper extremity supported Standing balance-Leahy Scale: Poor Standing balance comment: needing use of rw and physical assist.                     Cognition Arousal/Alertness: Lethargic Behavior During Therapy: WFL for tasks assessed/performed                   General Comments: pt with mild confusion during session, following commands consistently but needing repeated cues throughout.      Exercises      General Comments        Pertinent Vitals/Pain Pain Assessment: Faces Faces Pain Scale: Hurts a little bit Pain Location: Rt knee Pain Descriptors / Indicators: Guarding Pain Intervention(s): Limited activity within patient's tolerance;Monitored during session    Home Living  Prior Function            PT Goals (current goals can now be found in the care plan section) Acute Rehab PT Goals Patient Stated Goal: be able to stand/walk PT Goal Formulation: With patient Time For Goal Achievement: 01/31/16 Potential to Achieve Goals: Good Progress towards PT goals: Progressing toward goals    Frequency    7X/week      PT Plan Current plan remains appropriate    Co-evaluation PT/OT/SLP Co-Evaluation/Treatment: Yes Reason for Co-Treatment: For patient/therapist safety PT goals addressed during session: Mobility/safety with mobility        End of Session Equipment Utilized During Treatment: Gait belt;Right knee immobilizer Activity Tolerance: Patient limited by fatigue Patient left: in bed;with call bell/phone within reach     Time: 1020-1052 PT Time Calculation (min) (ACUTE ONLY): 32 min  Charges:  $Therapeutic Activity: 8-22 mins                    G Codes:      Cassell Clement, PT, CSCS Pager [phone] Office [phone]  01/25/2016, 11:05 AM

## 2016-01-25 NOTE — [place] FL2 (Signed)
[place] MEDICAID FL2 LEVEL OF CARE SCREENING TOOL     IDENTIFICATION  Patient Name: Daniel Whitney Birthdate: 1955-05-31 Sex: male Admission Date (Current Location): 01/24/2016  [facility] and [place] Number:  Herbalist and Address:  [facility], [place] [address], [place], [place] [address]      Provider Number: [id number]  Attending Physician Name and Address:  Dorna Leitz, MD  Relative Name and Phone Number:       Current Level of Care: Hospital Recommended Level of Care: [place] Prior Approval Number:    Date Approved/Denied: 01/25/16 PASRR Number: [id number]  Discharge Plan: SNF    Current Diagnoses: Patient Active Problem List   Diagnosis Date Noted  . S/P knee replacement 01/24/2016  . Low back pain 02/09/2015  . Bilateral knee pain 01/27/2015  . Atypical atrial flutter ([place]) 01/24/2015  . Chest pain   . Acute on chronic combined systolic and diastolic heart failure ([place]) 01/05/2015  . Hyperkalemia 11/08/2014  . End stage renal disease on dialysis ([place]) 11/08/2014  . Hypocalcemia 11/08/2014  . Weakness 11/08/2014  . Recurrent falls 10/13/2014  . Venous stasis ulcer ([place]) 10/13/2014  . Hypoxia   . Leukocytosis   . Respiratory failure ([place])   . Cardiac arrest ([place])   . ARDS (adult respiratory distress syndrome) ([place])   . Encounter for central line placement   . Asystole ([place]) 08/13/2014  . Cardiopulmonary arrest ([place])   . Compression fracture of L3 lumbar vertebra ([place]) 08/04/2014  . Anemia due to other cause 07/07/2014  . Allergic rhinitis 07/07/2014  . Right rotator cuff tear 04/27/2014  . SOB (shortness of breath) 01/29/2014  . Acute gouty arthritis 11/11/2013  . Hearing loss in right ear 11/11/2013  . Chronic pain syndrome 11/11/2013  . Right shoulder pain 11/11/2013  . Facial rash 11/01/2013  . Right otitis externa 10/27/2013  . Pulmonary HTN- PA 58 mmHg 04/16/13 09/17/2013  . Right hip pain  08/04/2013  . DOE (dyspnea on exertion) 06/18/2013  . Allergic rhinitis, cause unspecified 06/18/2013  . Carpal tunnel syndrome 06/06/2013  . Leg pain, bilateral 06/06/2013  . Primary localized osteoarthrosis, lower leg 06/04/2013  . Encounter for therapeutic drug monitoring 03/10/2013  . Preventative health care 12/30/2012  . Atrial fibrillation- failed Amiodarone 12/30/2012  . CHF (congestive heart failure) ([place]) 12/30/2012  . Diabetic retinopathy ([place]) 12/30/2012  . Morbid obesity ([place]) 12/30/2012  . OSA (obstructive sleep apnea)- on C-pap 12/30/2012  . Pulmonary hypertension 12/30/2012  . Bilateral hearing loss 12/30/2012  . Long term (current) use of anticoagulants 12/30/2012  . Arthritis   . Glaucoma   . Diabetes mellitus with neuropathy ([place])   . Hypertension   . ESRD (end stage renal disease) on dialysis ([place])   . Stroke ([place])   . Hyperlipidemia     Orientation RESPIRATION BLADDER Height & Weight     Self, Time, Situation, Place  Normal Continent Weight: 255 lb 8.2 oz (115.9 kg) Height:  5\' 9"  (175.3 cm)  BEHAVIORAL SYMPTOMS/MOOD NEUROLOGICAL BOWEL NUTRITION STATUS      Continent Diet (Diet renal with fluid restriction Fluid restriction: 1200 mL Fluid, thin liquids. Subject to change please see discharge summary)  AMBULATORY STATUS COMMUNICATION OF NEEDS Skin   Extensive Assist Verbally Surgical wounds (Closed incision right knee, compression wrap dressing)                       Personal Care Assistance Level of Assistance  Bathing, Feeding, Dressing Bathing Assistance: Maximum assistance Feeding assistance: Independent Dressing Assistance: Maximum assistance     Functional Limitations Info  Sight, Hearing, Speech Sight Info: Adequate Hearing Info: Adequate Speech Info: Adequate    SPECIAL CARE FACTORS FREQUENCY  PT (By licensed PT), OT (By licensed OT)     PT Frequency: 7x week OT Frequency: 7x week            Contractures Contractures Info:  Not present    Additional Factors Info  Code Status, Allergies Code Status Info: Full Allergies Info: No known allergies           Current Medications (01/25/2016):  This is the current hospital active medication list Current Facility-Administered Medications  Medication Dose Route Frequency Provider Last Rate Last Dose  . acetaminophen (TYLENOL) tablet 650 mg  650 mg Oral Q6H PRN Dorna Leitz, MD       Or  . acetaminophen (TYLENOL) suppository 650 mg  650 mg Rectal Q6H PRN Dorna Leitz, MD      . acetaminophen (TYLENOL) tablet 1,000 mg  1,000 mg Oral Q6H Dorna Leitz, MD   1,000 mg at 01/25/16 0541  . allopurinol (ZYLOPRIM) tablet 100 mg  100 mg Oral BID Valentina Gu, NP   100 mg at 01/24/16 2214  . amitriptyline (ELAVIL) tablet 75 mg  75 mg Oral QHS Valentina Gu, NP   75 mg at 01/25/16 0033  . bisacodyl (DULCOLAX) suppository 10 mg  10 mg Rectal Daily PRN Dorna Leitz, MD      . carvedilol (COREG) tablet 25 mg  25 mg Oral BID WC Valentina Gu, NP      . diphenhydrAMINE (BENADRYL) 12.5 MG/5ML elixir 12.5-25 mg  12.5-25 mg Oral Q8H PRN Valentina Gu, NP      . docusate sodium (COLACE) capsule 100 mg  100 mg Oral BID Dorna Leitz, MD   100 mg at 01/24/16 2214  . doxercalciferol (HECTOROL) injection 1 mcg  1 mcg Intravenous Q M,W,F-HD Valentina Gu, NP      . gabapentin (NEURONTIN) capsule 100 mg  100 mg Oral TID Valentina Gu, NP   100 mg at 01/24/16 2214  . HYDROcodone-acetaminophen (NORCO) 7.5-325 MG per tablet 1 tablet  1 tablet Oral Q6H Dorna Leitz, MD   1 tablet at 01/25/16 0541  . HYDROmorphone (DILAUDID) injection 1 mg  1 mg Intravenous Q2H PRN Dorna Leitz, MD   1 mg at 01/24/16 1901  . menthol-cetylpyridinium (CEPACOL) lozenge 3 mg  1 lozenge Oral PRN Dorna Leitz, MD       Or  . phenol (CHLORASEPTIC) mouth spray 1 spray  1 spray Mouth/Throat PRN Dorna Leitz, MD      . methocarbamol (ROBAXIN) tablet 500 mg  500 mg Oral Q6H PRN Dorna Leitz, MD    500 mg at 01/24/16 1240  . metoCLOPramide (REGLAN) tablet 5 mg  5 mg Oral Q8H PRN Dorna Leitz, MD       Or  . metoCLOPramide (REGLAN) injection 5 mg  5 mg Intravenous Q8H PRN Dorna Leitz, MD      . multivitamin (RENA-VIT) tablet 1 tablet  1 tablet Oral QHS Valentina Gu, NP   1 tablet at 01/24/16 2213  . ondansetron (ZOFRAN) tablet 4 mg  4 mg Oral Q6H PRN Dorna Leitz, MD       Or  . ondansetron (ZOFRAN) injection 4 mg  4 mg Intravenous Q6H PRN Dorna Leitz, MD      .  oxyCODONE (Oxy IR/ROXICODONE) immediate release tablet 5-10 mg  5-10 mg Oral Q3H PRN Dorna Leitz, MD   10 mg at 01/24/16 2214  . sevelamer carbonate (RENVELA) tablet 2,400 mg  2,400 mg Oral TID WC Valentina Gu, NP      . sevelamer carbonate (RENVELA) tablet 800 mg  800 mg Oral With snacks Valentina Gu, NP   800 mg at 01/24/16 2023  . simvastatin (ZOCOR) tablet 20 mg  20 mg Oral q1800 Valentina Gu, NP      . sodium chloride flush (NS) 0.9 % injection 10-40 mL  10-40 mL Intracatheter PRN Dorna Leitz, MD      . Warfarin - Pharmacist Dosing Inpatient   Does not apply [place], [facility]         Discharge Medications: Please see discharge summary for a list of discharge medications.  Relevant Imaging Results:  Relevant Lab Results:   Additional Information SSN: [id number]  Alla German, LCSW

## 2016-01-25 NOTE — Progress Notes (Signed)
Physical Therapy Treatment Patient Details Name: Daniel Whitney MRN: [id number] DOB: 1955-08-01 Today's Date: 01/25/2016    History of Present Illness 60 y.o. male admitted to [facility] on 01/24/16 for elective R TKA.  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  Per pt self report R RTC tear (not listed in chart) and very bad left knee arthritis.      PT Comments    Pt remains very lethargic but easier to arouse in pm. Session focused on exercise program which was limited due to the patient's lethargy and confusion. Heavy assistance needed with exercise program as pt having difficulty understanding and assisting with program. PT to continue to follow and attempt to progress mobility and ROM as tolerated. Continuing to recommend SNF upon D/C .     Follow Up Recommendations  SNF;Supervision/Assistance - 24 hour     Equipment Recommendations  None recommended by PT (to be addressed at next venue)    Recommendations for Other Services       Precautions / Restrictions Precautions Precautions: Knee;Fall Precaution Booklet Issued: Yes (comment) Required Braces or Orthoses: Knee Immobilizer - Right Knee Immobilizer - Right: Other (comment) (d/c once has strong SLR) Restrictions Weight Bearing Restrictions: Yes RLE Weight Bearing: Weight bearing as tolerated    Mobility  Bed Mobility               Transfers          Ambulation/Gait                 Stairs            Wheelchair Mobility    Modified Rankin (Stroke Patients Only)       Balance   Cognition Arousal/Alertness: Lethargic;Suspect due to medications Behavior During Therapy: [facility] for tasks assessed/performed Overall Cognitive Status: No family/caregiver present to  determine baseline cognitive functioning                 General Comments: Pt having difficulty following commands for exercises despite multiple cues.     Exercises Total Joint Exercises Ankle Circles/Pumps: PROM (pt not following cues to perform) Quad Sets:  (attempted, pt unable pt perform consistently. ) Short Arc Quad: Strengthening;Right;10 reps (max assist needed) Heel Slides: AAROM;Right;10 reps (max/total assist needed, pt unable to assist.) Hip ABduction/ADduction: Strengthening;Right;10 reps (mod/max assist, pt inconsistently assisting. ) Straight Leg Raises: Strengthening;Right;10 reps (max assist) Goniometric ROM: minimal knee flexion, approx. 20 degrees due to pt guarding and resisting motion despite cues to relax    General Comments        Pertinent Vitals/Pain Pain Assessment: Faces Faces Pain Scale: Hurts a little bit Pain Location: Rt knee Pain Descriptors / Indicators: Guarding Pain Intervention(s): Limited activity within patient's tolerance;Monitored during session    Home Living     Prior Function          PT Goals (current goals can now be found in the care plan section) Acute Rehab PT Goals Patient Stated Goal: not expressed PT Goal Formulation: With patient Time For Goal Achievement: 01/31/16 Potential to Achieve Goals: Good Progress towards PT goals: Not progressing toward goals - comment (pt remains confused and very lethargic)   Frequency    7X/week      PT Plan Current plan remains  appropriate    Co-evaluation          End of Session   Activity Tolerance: Patient limited by fatigue (confusion) Patient left: in bed;with call bell/phone within reach     Time: 9381-8299 PT Time Calculation (min) (ACUTE ONLY): 18 min  Charges:  $Therapeutic Exercise: 8-22 mins                    G Codes:      Cassell Clement, PT, CSCS Pager [phone] Office [phone]  01/25/2016, 3:25 PM

## 2016-01-25 NOTE — Progress Notes (Signed)
Responded to consult for advanced directive for [name] pt. Pt was post-op on pain meds, sound asleep. Consulted w/ nurse, who said pt's brother was coming tonight. Asked her to let her relief know that I would leave form in rm and to pls advise the brother it was there in case pt wished to discuss it w/ him. Chaplain available for f/u tomorrow.    01/25/16 1300  Clinical Encounter Type  Visited With Patient not available;Health care provider  Visit Type Initial;Other (Comment) (advanced directive)  Referral From Nurse  Spiritual Encounters  Spiritual Needs Brochure;Prayer  Stress Factors  Patient Stress Factors Health changes;Loss of control   Gerrit Heck, Chaplain

## 2016-01-25 NOTE — Progress Notes (Addendum)
Subjective: 1 Day Post-Op Procedure(s) (LRB): TOTAL KNEE ARTHROPLASTY (Right) Patient reports pain as moderate. Patient sitting up in chair. Asleep upon arrival, but arousable. Mild confusion initially but that seemed to clear. Denies chest pain or shortness of breath. Positive flatus. Appreciate renal service's assistance on the care of Mr. Morici.    Objective: Vital signs in last 24 hours: Temp:  [97.5 F (36.4 C)-98.4 F (36.9 C)] 98.1 F (36.7 C) (12/13 0535) Pulse Rate:  [81-99] 99 (12/13 0535) Resp:  [16-20] 20 (12/13 0535) BP: (106-166)/(62-90) 112/87 (12/13 0535) SpO2:  [89 %-100 %] 99 % (12/13 0535)  Intake/Output from previous day: 12/12 0701 - 12/13 0700 In: 620 [P.O.:220; I.V.:400] Out: 100 [Blood:100] Intake/Output this shift: Total I/O In: 240 [P.O.:240] Out: -    Recent Labs  01/24/16 0713 01/25/16 0347  HGB 11.6* 11.2*    Recent Labs  01/24/16 0713 01/25/16 0347  WBC  --  12.8*  RBC  --  3.60*  HCT 34.0* 34.7*  PLT  --  185    Recent Labs  01/24/16 0713 01/25/16 0347  NA 134* 134*  133*  K 4.1 5.6*  5.6*  CL  --  95*  94*  CO2  --  21*  21*  BUN  --  49*  50*  CREATININE  --  9.53*  9.53*  GLUCOSE 111* 145*  145*  CALCIUM  --  9.3  9.2    Recent Labs  01/24/16 0813 01/25/16 0347  INR 1.43 1.56  Right knee exam: Knee immobilizer intact. Moves foot actively. Distal pulses 1+. Calf is soft and nontender.    Assessment/Plan: 1 Day Post-Op Procedure(s) (LRB): TOTAL KNEE ARTHROPLASTY (Right) Plan: For dialysis today. One more day of Ancef 1 g every 12 hours due to increased risk of infection with his multiple medical comorbidities. Up with physical therapy. Weight-bear as tolerated on right. Wear knee immobilizer when up. Continue Coumadin back to his preoperative dose. Will need skilled nursing facility, probably Friday.   Laurelin Elson G 01/25/2016, 11:28 AM

## 2016-01-25 NOTE — Evaluation (Signed)
Occupational Therapy Evaluation Patient Details Name: Daniel Whitney MRN: [id number] DOB: 1955/06/15 Today's Date: 01/25/2016    History of Present Illness 60 y.o. male admitted to [facility] on 01/24/16 for elective R TKA.  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  Per pt self report R RTC tear (not listed in chart) and very bad left knee arthritis.     Clinical Impression   PTA Pt independent in ADL and mobility with spc/walker. Pt found up in chair upon arrival, displaying some confusion, and unable to recall how long he had been up. Nursing reports pt found in chair at start of shift. Initially pt difficult to arouse as sleeping heavily. Once aroused pt participating with therapy session but having mild confusion with repeated cues needed throughout session, especially initially during seated grooming tasks. Pt requiring +2 max assist for sit<>stand transfers and +2 total assist for sit to supine bed mobility. Pt unable to ambulate during session. OT recommending SNF for further rehabilitation following acute stay to maximize independence in ADL and mobility prior to home as Pt will be alone during the day.     Follow Up Recommendations  SNF;Supervision/Assistance - 24 hour    Equipment Recommendations  Other (comment) (defer to next venue of care)    Recommendations for Other Services       Precautions / Restrictions Precautions Precautions: Knee;Fall Precaution Booklet Issued: Yes (comment) Precaution Comments: knee exercise handout given during PT evaluation Required Braces or Orthoses: Knee Immobilizer - Right Knee Immobilizer - Right: Other (comment) (d/c once has strong SLR) Restrictions Weight Bearing Restrictions: Yes RLE Weight Bearing: Weight  bearing as tolerated      Mobility Bed Mobility Overal bed mobility: Needs Assistance Bed Mobility: Sit to Supine       Sit to supine: +2 for physical assistance;Total assist      Transfers Overall transfer level: Needs assistance Equipment used: Rolling walker (2 wheeled) Transfers: Sit to/from Stand Sit to Stand: +2 physical assistance;Max assist Stand pivot transfers: From elevated surface;Mod assist       General transfer comment: unable to perform stand-pivot transfers safely. Stood from chair X2 and bed brought from behind for transfer to bed.     Balance Overall balance assessment: Needs assistance Sitting-balance support: Bilateral upper extremity supported Sitting balance-Leahy Scale: Fair     Standing balance support: Bilateral upper extremity supported Standing balance-Leahy Scale: Poor Standing balance comment: needing use of rw and physical assist.                             ADL Overall ADL's : Needs assistance/impaired Eating/Feeding: Set up;Sitting   Grooming: Oral care;Wash/dry face;Set up;Minimal assistance;Cueing for sequencing;Sitting Grooming Details (indicate cue type and reason): Pt demonstrating confusion during grooming task sitting in recliner after being roused from sleep by therapy. Able to complete seated grooming with cues.          Upper Body Dressing : Moderate assistance;Bed level Upper Body Dressing Details (indicate cue type and reason): changed gown bed level due to spilled water during oral care Lower Body Dressing: Total assistance;Bed level   Toilet Transfer: Maximal assistance;+2 for physical assistance;+2 for safety/equipment Toilet Transfer Details (indicate cue type and reason): simulated. For  transfer from chair to bed, Therapy positioned the chair, stood the Pt, and had RN moved the chair away and pull the bed up behind. Pt unable to ambulate this session.         Functional mobility during ADLs:  (Not  able to ambulate this session) General ADL Comments: Pt was extremely hard to rouse at the beginning of this session. Once he started awake, he displayed confusion and had trouble following directions and carrying on a conversation (keeping comments on topic) Pt becoming more alert and oriented as session progressed.     Vision     Perception     Praxis      Pertinent Vitals/Pain Pain Assessment: Faces Faces Pain Scale: Hurts a little bit Pain Location: Rt knee Pain Descriptors / Indicators: Guarding Pain Intervention(s): Monitored during session;Repositioned     Hand Dominance     Extremity/Trunk Assessment Upper Extremity Assessment Upper Extremity Assessment: Generalized weakness   Lower Extremity Assessment Lower Extremity Assessment: RLE deficits/detail;Defer to PT evaluation RLE Deficits / Details: pt with normal post op pain and weakness. RLE Sensation: history of peripheral neuropathy LLE Deficits / Details: left leg per pt report has bad OA as well, varus deformity and enlarged joint.   LLE Sensation: history of peripheral neuropathy   Cervical / Trunk Assessment Cervical / Trunk Assessment: Normal   Communication Communication Communication: No difficulties   Cognition Arousal/Alertness: Lethargic Behavior During Therapy: WFL for tasks assessed/performed Overall Cognitive Status: No family/caregiver present to determine baseline cognitive functioning                 General Comments: pt with mild confusion during session, following commands consistently but needing repeated cues throughout.     General Comments       Exercises Exercises:  (unable to attempt exercises due to level of fatigue)     Shoulder Instructions      Home Living Family/patient expects to be discharged to:: Skilled nursing facility Living Arrangements: Other relatives (Brother) Available Help at Discharge: Family;Available PRN/intermittently (brother works 7am-11pm) Type of  Home: House Home Access: Stairs to enter Technical brewer of Steps: 5 Entrance Stairs-Rails: Left Home Layout: One level     Bathroom Shower/Tub: Tub/shower unit Shower/tub characteristics: Curtain Biochemist, clinical: Standard     Home Equipment: Environmental consultant - 4 wheels;Cane - quad;Shower seat          Prior Functioning/Environment Level of Independence: Independent with assistive device(s)        Comments: used cane and rollator for ambulation        OT Problem List: Decreased strength;Decreased range of motion;Decreased activity tolerance;Impaired balance (sitting and/or standing);Decreased safety awareness;Decreased knowledge of use of DME or AE;Obesity;Impaired sensation   OT Treatment/Interventions: Self-care/ADL training;Therapeutic exercise;DME and/or AE instruction;Therapeutic activities;Patient/family education;Balance training    OT Goals(Current goals can be found in the care plan section) Acute Rehab OT Goals Patient Stated Goal: be able to stand/walk OT Goal Formulation: With patient Time For Goal Achievement: 02/01/16 Potential to Achieve Goals: Good ADL Goals Pt Will Perform Grooming: with modified independence;standing Pt Will Perform Lower Body Dressing: with modified independence;with adaptive equipment;sit to/from stand Pt Will Transfer to Toilet: with modified independence;ambulating;bedside commode (with RW) Pt Will Perform Toileting - Clothing Manipulation and hygiene: with modified independence;sit to/from stand Pt Will Perform Tub/Shower Transfer: Tub transfer;with supervision;with caregiver independent in assisting;ambulating;3 in 1;rolling walker  OT Frequency: Min 2X/week   Barriers to D/C: Decreased caregiver support  brother, who lives with him, works  during the day       Co-evaluation PT/OT/SLP Co-Evaluation/Treatment: Yes Reason for Co-Treatment: For patient/therapist safety;To address functional/ADL transfers PT goals addressed during  session: Mobility/safety with mobility OT goals addressed during session: ADL's and self-care      End of Session Equipment Utilized During Treatment: Gait belt;Rolling walker;Right knee immobilizer CPM Right Knee CPM Right Knee: Off Nurse Communication: Mobility status;Other (comment) (RN in room to help with transfer and exited with therapy)  Activity Tolerance: Patient limited by lethargy Patient left: in bed;with call bell/phone within reach   Time: 1023-1053 OT Time Calculation (min): 30 min Charges:  OT General Charges $OT Visit: 1 Procedure OT Evaluation $OT Eval Moderate Complexity: 1 Procedure G-Codes:    Merri Ray Brigitt Mcclish 02/20/16, 11:50 AM Hulda Humphrey OTR/L [phone]

## 2016-01-25 NOTE — Clinical Social Work Note (Signed)
Clinical Social Work Assessment  Patient Details  Name: Daniel Whitney MRN: [id number] Date of Birth: 07-01-1955  Date of referral:  01/25/16               Reason for consult:  Facility Placement                Permission sought to share information with:  Family Supports Permission granted to share information::  Yes, Verbal Permission Granted  Name::     Daniel Whitney  Agency::     Relationship::  Brother  Contact Information:  [phone]  Housing/Transportation Living arrangements for the past 2 months:  Single Family Home Source of Information:  Patient Patient Interpreter Needed:  None Criminal Activity/Legal Involvement Pertinent to Current Situation/Hospitalization:  No - Comment as needed Significant Relationships:  Siblings Lives with:  Self Do you feel safe going back to the place where you live?  Yes Need for family participation in patient care:  Yes (Comment)  Care giving concerns:  No family or friends at bedside at this time. Pt granted CSW verbal permission to contact pt's brother if needed.   Social Worker assessment / plan:  CSW spoke with pt at bedside. Pt lives home alone. Pt reports he drives himself to dialysis and if he is not feeling well his brother drives him. His brother started working 10:00 AM-6:00 Pm. Pt has dialysis at [facility], on [address]. Pt is agreeable to SNF placement at this time. Pt will need to be placed at a facility that can provide transportation there and back. CSW will reach out to facilities and provide bed offers once available.   Employment status:    Insurance information:  Medicare PT Recommendations:  [place] / Referral to community resources:  [place]  Patient/Family's Response to care:  Pt verbalized understanding of CSW role and appreciation of support. Pt denies any concern regarding pt care.  Patient/Family's Understanding of and Emotional Response to Diagnosis,  Current Treatment, and Prognosis:  Pt understanding and realistic regarding physical limitations. Pt agreeable to SNF placement at this time. Pt denies any concerns regarding treatment plan at this time. CSW will continue to provide support.   Emotional Assessment Appearance:  Appears stated age Attitude/Demeanor/Rapport:   (Patient was appropriate.) Affect (typically observed):  Accepting, Appropriate Orientation:  Oriented to Self, Oriented to Place, Oriented to  Time, Oriented to Situation Alcohol / Substance use:  Not Applicable Psych involvement (Current and /or in the community):  No (Comment)  Discharge Needs  Concerns to be addressed:  Care Coordination Readmission within the last 30 days:  No Current discharge risk:  Dependent with Mobility Barriers to Discharge:  Continued Medical Work up   [employer], LCSW 01/25/2016, 4:17 PM

## 2016-01-25 NOTE — Op Note (Signed)
Daniel Whitney, Daniel Whitney                ACCOUNT NO.:  [id number]  MEDICAL RECORD NO.:  [id number]  LOCATION:                                 FACILITY:  PHYSICIAN:  Alta Corning, M.D.   DATE OF BIRTH:  [birth year]  DATE OF PROCEDURE:  01/24/2016 DATE OF DISCHARGE:                              OPERATIVE REPORT   PREOPERATIVE DIAGNOSIS:  End-stage degenerative joint disease, right knee with severe valgus malalignment and bone-on-bone change.  POSTOPERATIVE DIAGNOSIS:  End-stage degenerative joint disease, right knee with severe valgus malalignment and bone-on-bone change.  PROCEDURE:  Right total knee replacement with a Sigma System size 3 femur, size 4 MBT style revision tray tibia, 10-mm bridging bearing, and a 38-mm all polyethylene patella.  SURGEON:  Alta Corning, M.D.  ASSISTANT:  April Green, RNFA.  BRIEF HISTORY:  Mr. Craze is a 60 year old male renal dialysis patient with a long history of severe bilateral arthritis of the knee.  We followed him for 6 months basically trying to hold off doing any kind of surgical intervention just based on these high risks that he has.  We had a long discussion with him and his family that he brought in and they certainly understood his increased risk with renal dialysis and other issues.  It turns out he is also a Jehovah's Witness and he understood certainly risk of bleeding preoperatively.  He is supposed to come off his Coumadin for 5 days but unfortunately he did not, came off for 2 days.  We tested his INR, 1.5 was what our INR wanted to be under to feel like it was safe doing the case and his INR was 1.4. There was delay of his case for greater than 2 hours while we evaluated his medical situation with anesthesia. Blood had to be drawn to check INR because he had not been off of his Coumadin for long enough. That sample initially was unacceptable and an additional sample had to be sent. This extended the time of his total evaluation  procedure to greater than 3-1/2 hours. So he understood there was slight increased risk of bleeding relative to that but was willing to take this risk.  He was then taken to the operating room for total knee replacement.  DESCRIPTION OF PROCEDURE:  The patient was taken to the operating room. After adequate anesthesia was obtained with general anesthetic, the patient was placed supine on the operating table, and the right leg was prepped and draped in usual sterile fashion.  Following this, the leg was exsanguinated.  Blood pressure tourniquet was inflated to 350 mmHg. Following this, a midline incision was made in subcutaneous tissue down to the level of the extensor mechanism.  A medial parapatellar arthrotomy was undertaken.  Following this, attention was turned to the right knee where medial and lateral meniscus removed, retropatellar fat pad, synovium in anterior aspect of the femur, and anterior-posterior cruciates.  Following this, attention was turned to the knee when intramedullary pilot hole was drilled, 11 mm of distal bone resected and a 5-degree valgus alignment.  Attention then turned to sizing and sized to a 3.  Anterior and posterior cuts were made, chamfers  and box. Attention turned to the tibia, where the tibia was exposed, cut perpendicular to its long axis, and the tibia was resected.  At this point, a spacer block was put in place, 10 does not easily get into the knee.  At this point, we took 2 more millimeters of tibia and then 10 allowed to easily get into the knee.  At this point, attention was turned back to the knee where the tibia was sized to a 4, it was drilled and keeled, and the trials were put in 4 tibia, 3 femur, and a 10-mm bridging bearing, put through a range of motion, excellent stability was achieved throughout the range of motion.  Gap balance was excellent. Attention turned to the patella, cut down to a level of 13 mm and lugs were drilled for the  femur and for the patella.  The trial patella was put in place.  Excellent range of motion and stability were achieved. Attention now turned back to the knee where the trial components removed.  The knee was copiously and thoroughly lavaged, pulsatile lavage, irrigation and suctioned dry.  The final components were cemented into place size 3 femur, size 4 tibia, MBT style revision tray, 10 mm bridging bearing trial was placed, and a 38-mm all-polyethylene patella placed and held with a clamp.  All excess bone cement was removed.  Once the cement was completely hardened, tourniquet was let down.  All bleeding was controlled with electrocautery.  At this point, a topical TXA was used throughout the case to help control intraoperative bleeding as well.  At this point, the final polyethylene was placed and the medial parapatellar arthrotomy was closed with 1 Vicryl running stitch.  Of note, Zinacef was mixed into the cement just to help give a better leaching effect for a high risk patient for infection.  At this point, the skin was closed with 2-0 Vicryl and 3-0 Monocryl subcuticular.  Benzoin and Steri-Strips were applied.  Sterile compressive dressing was applied.  The patient was taken to the recovery room where he was noted to be in satisfactory condition.  Estimated blood loss for the procedure was minimal.     Alta Corning, M.D.   ______________________________ Alta Corning, M.D.    Corliss Skains  D:  01/24/2016  T:  01/25/2016  Job:  [id number]

## 2016-01-25 NOTE — Discharge Instructions (Addendum)
Information on my medicine - Coumadin   (Warfarin)  This medication education was reviewed with me or my healthcare representative as part of my discharge preparation.  \  Why was Coumadin prescribed for you? Coumadin was prescribed for you because you have a blood clot or a medical condition that can cause an increased risk of forming blood clots. Blood clots can cause serious health problems by blocking the flow of blood to the heart, lung, or brain. Coumadin can prevent harmful blood clots from forming. As a reminder your indication for Coumadin is:   Stroke Prevention Because Of Atrial Fibrillation  What test will check on my response to Coumadin? While on Coumadin (warfarin) you will need to have an INR test regularly to ensure that your dose is keeping you in the desired range. The INR (international normalized ratio) number is calculated from the result of the laboratory test called prothrombin time (PT).  If an INR APPOINTMENT HAS NOT ALREADY BEEN MADE FOR YOU please schedule an appointment to have this lab work done by your health care provider within 7 days. Your INR goal is usually a number between:  2 to 3 or your provider may give you a more narrow range like 2-2.5.  Ask your health care provider during an office visit what your goal INR is.  What  do you need to  know  About  COUMADIN? Take Coumadin (warfarin) exactly as prescribed by your healthcare provider about the same time each day.  DO NOT stop taking without talking to the doctor who prescribed the medication.  Stopping without other blood clot prevention medication to take the place of Coumadin may increase your risk of developing a new clot or stroke.  Get refills before you run out.  What do you do if you miss a dose? If you miss a dose, take it as soon as you remember on the same day then continue your regularly scheduled regimen the next day.  Do not take two doses of Coumadin at the same time.  Important Safety  Information A possible side effect of Coumadin (Warfarin) is an increased risk of bleeding. You should call your healthcare provider right away if you experience any of the following: ? Bleeding from an injury or your nose that does not stop. ? Unusual colored urine (red or dark brown) or unusual colored stools (red or black). ? Unusual bruising for unknown reasons. ? A serious fall or if you hit your head (even if there is no bleeding).  Some foods or medicines interact with Coumadin (warfarin) and might alter your response to warfarin. To help avoid this: ? Eat a balanced diet, maintaining a consistent amount of Vitamin K. ? Notify your provider about major diet changes you plan to make. ? Avoid alcohol or limit your intake to 1 drink for women and 2 drinks for men per day. (1 drink is 5 oz. wine, 12 oz. beer, or 1.5 oz. liquor.)  Make sure that ANY health care provider who prescribes medication for you knows that you are taking Coumadin (warfarin).  Also make sure the healthcare provider who is monitoring your Coumadin knows when you have started a new medication including herbals and non-prescription products.  Coumadin (Warfarin)  Major Drug Interactions  Increased Warfarin Effect Decreased Warfarin Effect  Alcohol (large quantities) Antibiotics (esp. Septra/Bactrim, Flagyl, Cipro) Amiodarone (Cordarone) Aspirin (ASA) Cimetidine (Tagamet) Megestrol (Megace) NSAIDs (ibuprofen, naproxen, etc.) Piroxicam (Feldene) Propafenone (Rythmol SR) Propranolol (Inderal) Isoniazid (INH) Posaconazole (Noxafil) Barbiturates (Phenobarbital)  Carbamazepine (Tegretol) Chlordiazepoxide (Librium) Cholestyramine (Questran) Griseofulvin Oral Contraceptives Rifampin Sucralfate (Carafate) Vitamin K   Coumadin (Warfarin) Major Herbal Interactions  Increased Warfarin Effect Decreased Warfarin Effect  Garlic Ginseng Ginkgo biloba Coenzyme Q10 Green tea St. Johns wort    Coumadin (Warfarin)  FOOD Interactions  Eat a consistent number of servings per week of foods HIGH in Vitamin K (1 serving =  cup)  Collards (cooked, or boiled & drained) Kale (cooked, or boiled & drained) Mustard greens (cooked, or boiled & drained) Parsley *serving size only =  cup Spinach (cooked, or boiled & drained) Swiss chard (cooked, or boiled & drained) Turnip greens (cooked, or boiled & drained)  Eat a consistent number of servings per week of foods MEDIUM-HIGH in Vitamin K (1 serving = 1 cup)  Asparagus (cooked, or boiled & drained) Broccoli (cooked, boiled & drained, or raw & chopped) Brussel sprouts (cooked, or boiled & drained) *serving size only =  cup Lettuce, raw (green leaf, endive, romaine) Spinach, raw Turnip greens, raw & chopped   These websites have more information on Coumadin (warfarin):  FailFactory.se; VeganReport.com.au;  INSTRUCTIONS AFTER JOINT REPLACEMENT   o Remove items at home which could result in a fall. This includes throw rugs or furniture in walking pathways o ICE to the affected joint every three hours while awake for 30 minutes at a time, for at least the first 3-5 days, and then as needed for pain and swelling.  Continue to use ice for pain and swelling. You may notice swelling that will progress down to the foot and ankle.  This is normal after surgery.  Elevate your leg when you are not up walking on it.   o Continue to use the breathing machine you got in the hospital (incentive spirometer) which will help keep your temperature down.  It is common for your temperature to cycle up and down following surgery, especially at night when you are not up moving around and exerting yourself.  The breathing machine keeps your lungs expanded and your temperature down.   DIET:  As you were doing prior to hospitalization, we recommend a well-balanced diet.  DRESSING / WOUND CARE / SHOWERING  Keep the surgical dressing until follow up.  The dressing is  water proof, so you can shower without any extra covering.  IF THE DRESSING FALLS OFF or the wound gets wet inside, change the dressing with sterile gauze.  Please use good hand washing techniques before changing the dressing.  Do not use any lotions or creams on the incision until instructed by your surgeon.    ACTIVITY  o Increase activity slowly as tolerated, but follow the weight bearing instructions below.   o No driving for 6 weeks or until further direction given by your physician.  You cannot drive while taking narcotics.  o No lifting or carrying greater than 10 lbs. until further directed by your surgeon. o Avoid periods of inactivity such as sitting longer than an hour when not asleep. This helps prevent blood clots.  o You may return to work once you are authorized by your doctor.     WEIGHT BEARING   Weight bearing as tolerated with assist device (walker, cane, etc) as directed, use it as long as suggested by your surgeon or therapist, typically at least 4-6 weeks.   EXERCISES  Results after joint replacement surgery are often greatly improved when you follow the exercise, range of motion and muscle strengthening exercises prescribed by your doctor. Safety measures  are also important to protect the joint from further injury. Any time any of these exercises cause you to have increased pain or swelling, decrease what you are doing until you are comfortable again and then slowly increase them. If you have problems or questions, call your caregiver or physical therapist for advice.   Rehabilitation is important following a joint replacement. After just a few days of immobilization, the muscles of the leg can become weakened and shrink (atrophy).  These exercises are designed to build up the tone and strength of the thigh and leg muscles and to improve motion. Often times heat used for twenty to thirty minutes before working out will loosen up your tissues and help with improving the  range of motion but do not use heat for the first two weeks following surgery (sometimes heat can increase post-operative swelling).   These exercises can be done on a training (exercise) mat, on the floor, on a table or on a bed. Use whatever works the best and is most comfortable for you.    Use music or television while you are exercising so that the exercises are a pleasant break in your day. This will make your life better with the exercises acting as a break in your routine that you can look forward to.   Perform all exercises about fifteen times, three times per day or as directed.  You should exercise both the operative leg and the other leg as well.  Exercises include:    Quad Sets - Tighten up the muscle on the front of the thigh (Quad) and hold for 5-10 seconds.    Straight Leg Raises - With your knee straight (if you were given a brace, keep it on), lift the leg to 60 degrees, hold for 3 seconds, and slowly lower the leg.  Perform this exercise against resistance later as your leg gets stronger.   Leg Slides: Lying on your back, slowly slide your foot toward your buttocks, bending your knee up off the floor (only go as far as is comfortable). Then slowly slide your foot back down until your leg is flat on the floor again.   Angel Wings: Lying on your back spread your legs to the side as far apart as you can without causing discomfort.   Hamstring Strength:  Lying on your back, push your heel against the floor with your leg straight by tightening up the muscles of your buttocks.  Repeat, but this time bend your knee to a comfortable angle, and push your heel against the floor.  You may put a pillow under the heel to make it more comfortable if necessary.   A rehabilitation program following joint replacement surgery can speed recovery and prevent re-injury in the future due to weakened muscles. Contact your doctor or a physical therapist for more information on knee rehabilitation.     CONSTIPATION  Constipation is defined medically as fewer than three stools per week and severe constipation as less than one stool per week.  Even if you have a regular bowel pattern at home, your normal regimen is likely to be disrupted due to multiple reasons following surgery.  Combination of anesthesia, postoperative narcotics, change in appetite and fluid intake all can affect your bowels.   YOU MUST use at least one of the following options; they are listed in order of increasing strength to get the job done.  They are all available over the counter, and you may need to use some, POSSIBLY even  all of these options:    Drink plenty of fluids (prune juice may be helpful) and high fiber foods Colace 100 mg by mouth twice a day  Senokot for constipation as directed and as needed Dulcolax (bisacodyl), take with full glass of water  Miralax (polyethylene glycol) once or twice a day as needed.  If you have tried all these things and are unable to have a bowel movement in the first 3-4 days after surgery call either your surgeon or your primary doctor.    If you experience loose stools or diarrhea, hold the medications until you stool forms back up.  If your symptoms do not get better within 1 week or if they get worse, check with your doctor.  If you experience "the worst abdominal pain ever" or develop nausea or vomiting, please contact the office immediately for further recommendations for treatment.   ITCHING:  If you experience itching with your medications, try taking only a single pain pill, or even half a pain pill at a time.  You can also use Benadryl over the counter for itching or also to help with sleep.   TED HOSE STOCKINGS:  Use stockings on both legs until for at least 2 weeks or as directed by physician office. They may be removed at night for sleeping.  MEDICATIONS:  See your medication summary on the After Visit Summary that nursing will review with you.  You may have some  home medications which will be placed on hold until you complete the course of blood thinner medication.  It is important for you to complete the blood thinner medication as prescribed.  PRECAUTIONS:  If you experience chest pain or shortness of breath - call 911 immediately for transfer to the hospital emergency department.   If you develop a fever greater that 101 F, purulent drainage from wound, increased redness or drainage from wound, foul odor from the wound/dressing, or calf pain - CONTACT YOUR SURGEON.                                                   FOLLOW-UP APPOINTMENTS:  If you do not already have a post-op appointment, please call the office for an appointment to be seen by your surgeon.  Guidelines for how soon to be seen are listed in your After Visit Summary, but are typically between 1-4 weeks after surgery.  OTHER INSTRUCTIONS:   Knee Replacement:  Do not place pillow under knee, focus on keeping the knee straight while resting. CPM instructions: 0-90 degrees, 2 hours in the morning, 2 hours in the afternoon, and 2 hours in the evening. Place foam block, curve side up under heel at all times except when in CPM or when walking.  DO NOT modify, tear, cut, or change the foam block in any way.  MAKE SURE YOU:   Understand these instructions.   Get help right away if you are not doing well or get worse.    Thank you for letting us be a part of your medical care team.  It is a privilege we respect greatly.  We hope these instructions will help you stay on track for a fast and full recovery!

## 2016-01-26 LAB — CBC
HEMATOCRIT: 30.2 % — AB (ref 39.0–52.0)
Hemoglobin: 9.6 g/dL — ABNORMAL LOW (ref 13.0–17.0)
MCH: 30.6 pg (ref 26.0–34.0)
MCHC: 31.8 g/dL (ref 30.0–36.0)
MCV: 96.2 fL (ref 78.0–100.0)
Platelets: 163 10*3/uL (ref 150–400)
RBC: 3.14 MIL/uL — ABNORMAL LOW (ref 4.22–5.81)
RDW: 14.4 % (ref 11.5–15.5)
WBC: 14 10*3/uL — ABNORMAL HIGH (ref 4.0–10.5)

## 2016-01-26 LAB — PROTIME-INR
INR: 2.52
Prothrombin Time: 27.6 seconds — ABNORMAL HIGH (ref 11.4–15.2)

## 2016-01-26 MED ORDER — HEPARIN SODIUM (PORCINE) 1000 UNIT/ML DIALYSIS: 1000.0000 [IU] | INTRAMUSCULAR | Status: DC | PRN

## 2016-01-26 MED ORDER — DOXERCALCIFEROL 4 MCG/2ML IV SOLN
INTRAVENOUS | Status: AC
  Filled 2016-01-26: qty 2

## 2016-01-26 MED ORDER — HYDROMORPHONE HCL 1 MG/ML IJ SOLN
INTRAMUSCULAR | Status: AC
  Filled 2016-01-26: qty 1

## 2016-01-26 MED ORDER — LIDOCAINE HCL (PF) 1 % IJ SOLN: 5.0000 mL | INTRAMUSCULAR | Status: DC | PRN

## 2016-01-26 MED ORDER — PENTAFLUOROPROP-TETRAFLUOROETH EX AERO: 1.0000 "application " | INHALATION_SPRAY | CUTANEOUS | Status: DC | PRN

## 2016-01-26 MED ORDER — SODIUM CHLORIDE 0.9 % IV SOLN: 100.0000 mL | INTRAVENOUS | Status: DC | PRN

## 2016-01-26 MED ORDER — WARFARIN SODIUM 3 MG PO TABS
3.0000 mg | ORAL_TABLET | Freq: Once | ORAL | Status: AC
  Administered 2016-01-26: 3 mg via ORAL
  Filled 2016-01-26: qty 1

## 2016-01-26 MED ORDER — DARBEPOETIN ALFA 40 MCG/0.4ML IJ SOSY
40.0000 ug | PREFILLED_SYRINGE | INTRAMUSCULAR | Status: DC
  Administered 2016-01-27: 40 ug via INTRAVENOUS
  Filled 2016-01-26: qty 0.4

## 2016-01-26 MED ORDER — LIDOCAINE-PRILOCAINE 2.5-2.5 % EX CREA: 1.0000 "application " | TOPICAL_CREAM | CUTANEOUS | Status: DC | PRN

## 2016-01-26 NOTE — Care Management Note (Signed)
Case Management Note  Patient Details  Name: Daniel Whitney MRN: [id number] Date of Birth: 1955/03/10  Subjective/Objective:  Pt admitted on 01/24/16 s/p Rt TKA.   PTA, pt resides at home with brother.                   Action/Plan: CSW following for SNF for rehab, as recommended by PT/OT.    Expected Discharge Date:               Expected Discharge Plan:  Skilled Nursing Facility  In-House Referral:  Clinical Social Work  Discharge planning Services     Post Acute Care Choice:    Choice offered to:     DME Arranged:    DME Agency:     HH Arranged:    [place] Agency:     Status of Service:  In process, will continue to follow  If discussed at Long Length of Stay Meetings, dates discussed:    Additional Comments:  Ella Bodo, RN 01/26/2016, 10:27 AM

## 2016-01-26 NOTE — Progress Notes (Signed)
Returned to explain/discuss advanced directive w/ pt, who was awake and alert this morning. He wishes to complete one, understands form, wishes to name healthcare agent and be DNR, and will discuss this with his brother when he comes to visit tonight. Pt will be ready to complete form tomorrow. Pt said his brother had called his own [place] clergy, and is also happy if another [place] clergy member visiting the hospital visits him as well. Will check to see when/if that could occur and will f/u tomorrow re: advanced directive.    01/26/16 0900  Clinical Encounter Type  Visited With Patient  Visit Type Follow-up;Social support;Post-op;Other (Comment) (advanced directive)  Referral From Nurse  Spiritual Encounters  Spiritual Needs Brochure;Emotional  Stress Factors  Patient Stress Factors Health changes;Loss of control   Gerrit Heck, Chaplain

## 2016-01-26 NOTE — Progress Notes (Signed)
[facility] Progress Note   Subjective:  "I'm doing good!" Pt up in chair. POD # 2. No C/Os.   Objective Vitals:   01/26/16 0130 01/26/16 0144 01/26/16 0227 01/26/16 0534  BP: (!) 98/57 109/66  (!) 102/51  Pulse: (!) 102 98 100 93  Resp:  18  16  Temp:  98.1 F (36.7 C)  99.9 F (37.7 C)  TempSrc:  Oral  Oral  SpO2:  100% 95% 99%  Weight:  110.4 kg (243 lb 6.2 oz)    Height:       Physical Exam General: WNWD very pleasant, NAD Heart: regularly irregular Lungs: CTAB A/P Abdomen: S,NT Extremities: Knee immobilizer RLE, No edema LLE Dialysis Access: LFA AVF + bruit   Additional Objective Labs: Basic Metabolic Panel:  Recent Labs Lab 01/24/16 0713 01/25/16 0347  NA 134* 134*  133*  K 4.1 5.6*  5.6*  CL  --  95*  94*  CO2  --  21*  21*  GLUCOSE 111* 145*  145*  BUN  --  49*  50*  CREATININE  --  9.53*  9.53*  CALCIUM  --  9.3  9.2  PHOS  --  8.2*   Liver Function Tests:  Recent Labs Lab 01/25/16 0347  ALBUMIN 3.2*   No results for input(s): LIPASE, AMYLASE in the last 168 hours. CBC:  Recent Labs Lab 01/24/16 0713 01/25/16 0347 01/26/16 0548  WBC  --  12.8* 14.0*  HGB 11.6* 11.2* 9.6*  HCT 34.0* 34.7* 30.2*  MCV  --  96.4 96.2  PLT  --  185 163   Blood Culture    Component Value Date/Time   SDES URINE, CATHETERIZED 08/25/2014 0641   SPECREQUEST NONE 08/25/2014 0641   CULT 20,000 COLONIES/mL ESCHERICHIA COLI 08/25/2014 0641   REPTSTATUS 08/27/2014 FINAL 08/25/2014 0641    Cardiac Enzymes: No results for input(s): CKTOTAL, CKMB, CKMBINDEX, TROPONINI in the last 168 hours. CBG:  Recent Labs Lab 01/24/16 1129  GLUCAP 189*   Iron Studies: No results for input(s): IRON, TIBC, TRANSFERRIN, FERRITIN in the last 72 hours. @lablastinr3 @ Studies/Results: Dg Chest Port 1 View  Result Date: 01/24/2016 CLINICAL DATA:  Status post central line placement. EXAM: PORTABLE CHEST 1 VIEW COMPARISON:  01/17/2016 FINDINGS:  Status post placement of left jugular central line with the catheter tip in the upper SVC. No pneumothorax. Lung volumes are low with bibasilar atelectasis present. No overt edema or pleural fluid identified. The heart size is within normal limits. Stable tortuosity of the thoracic aorta. IMPRESSION: Newly placed left jugular central line with catheter tip in upper SVC. No pneumothorax identified. Electronically Signed   By: Aletta Edouard M.D.   On: 01/24/2016 14:28   Medications:  . allopurinol  100 mg Oral BID  . amitriptyline  75 mg Oral QHS  . carvedilol  25 mg Oral BID WC  . docusate sodium  100 mg Oral BID  . doxercalciferol  1 mcg Intravenous Q M,W,F-HD  . gabapentin  100 mg Oral TID  . HYDROcodone-acetaminophen  1 tablet Oral Q6H  . multivitamin  1 tablet Oral QHS  . sevelamer carbonate  2,400 mg Oral TID WC  . sevelamer carbonate  800 mg Oral With snacks  . simvastatin  20 mg Oral q1800  . warfarin  3 mg Oral ONCE-1800  . Warfarin - Pharmacist Dosing Inpatient   Does not apply q1800     Dialysis Orders: [facility] MWF 4 hours 450/800 111.5 kg 2.0 K/2.5 Ca  Linear Na Heparin 3000 units IV q treatment Hectoral 1 mcg IV q tx (Last PTH 285 12/07/15) Recent Labs: HGB 11.6 (01/18/16) Phos 4.5 Ca 9.5 C Ca 9.6 Albumin 3.9 (01/03/16)  BMD medications: Renvela 800 mg 3 tablets PO TID w/meals 1 tablet with snack.  Assessment/Plan: 1 Osteoarthritis R Knee/Total knee Arthoplasty: Per Dr. Berenice Primas. POD # 2. Rec'd Ancef 1 gram q 12 hours X 3 doses. No evidence of toxicity. WBC 14.0 today. Still has low grade temp 99.9. Per primary.  2. ESRD -MWF via LFA AVF. K+ 5.6 01/25/16 prior to HD. For HD tomorrow No heparin. Do 1st shift 3. Anemia - HGB 9.6. No OP ESA. Follow HGB. PT IS [name]-NO BLOOD PRODUCTS! Will watch HGB, if still falling, will start Aranesp tomorrow.  4. Secondary hyperparathyroidism - Phos 8.2 Ca 9.53 C Ca 8.84. Continue binders, VDRA 5. HTN/volume - Had HD 01/25/16.  Post wt 110.4 kg. Slightly below OP EDW but pt is not drinking or consuming salt as he usually does at home. No edema/evidence of volume overload. Attempt 1-1.5 kg tomorrow.  6. Nutrition -Renal diet/Fld restrictions reinforced. Renal vit/nepro. 7. H/O Afib: On coumadin per pharmacy 8. H/O Gout: Allopurinol resumed.  Disposition: Possible DC tomorrow to SNF for rehab. Patient says this is fine with him, that he doesn't want to be a burden on his family.   Rita H. Brown NP-C 01/26/2016, 1:30 PM  [facility] [phone]  Pt seen, examined and agree w A/P as above.  Kelly Splinter MD [employer] pager [phone]   01/27/2016, 8:35 AM

## 2016-01-26 NOTE — Progress Notes (Signed)
ANTICOAGULATION CONSULT NOTE - Initial Consult  Pharmacy Consult:  Coumadin Indication: atrial fibrillation, secondary stroke prevention  Allergies  Allergen Reactions  . No Known Allergies     Patient Measurements: Height: 5\' 9"  (175.3 cm) Weight: 243 lb 6.2 oz (110.4 kg) IBW/kg (Calculated) : 70.7  Vital Signs: Temp: 99.9 F (37.7 C) (12/14 0534) Temp Source: Oral (12/14 0534) BP: 102/51 (12/14 0534) Pulse Rate: 93 (12/14 0534)  Labs:  Recent Labs  01/24/16 0713 01/24/16 0813 01/25/16 0347 01/26/16 0548  HGB 11.6*  --  11.2* 9.6*  HCT 34.0*  --  34.7* 30.2*  PLT  --   --  185 163  LABPROT  --  17.5* 18.8* 27.6*  INR  --  1.43 1.56 2.52  CREATININE  --   --  9.53*  9.53*  --     Estimated Creatinine Clearance: 10 mL/min (by C-G formula based on SCr of 9.53 mg/dL (H)).  Assessment: 60 yo m presenting with right knee pain - admission for knee replacement  PMH: Afib, HF, ESRD HD, HTN, DM  Anticoag: warfarin for Afib and VTE px s/p right TKA 12/12 - Admit INR 1.43 INR today 2.52  Nephro: ESRD on HD MWF  Heme/Onc: H&H 9.6/30.2, Plt 163  Goal of Therapy:  INR 2-3   Plan:  - Coumadin 3mg  PO today - Daily PT / INR - CBC q72h  Levester Fresh, PharmD, BCPS, BCCCP Clinical Pharmacist Clinical phone for 01/26/2016 from 7a-3:30p: [phone] If after 3:30p, please call main pharmacy at: [phone] 01/26/2016 9:50 AM

## 2016-01-26 NOTE — Progress Notes (Signed)
Physical Therapy Treatment Patient Details Name: Daniel Whitney MRN: [id number] DOB: 03/10/1955 Today's Date: 01/26/2016    History of Present Illness 60 y.o. male admitted to [facility] on 01/24/16 for elective R TKA.  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  Per pt self report R RTC tear (not listed in chart) and very bad left knee arthritis.      PT Comments    Patient is making gradual progress toward mobility goals. Mod/max A +2 for all OOB mobility. Continue to progress as tolerated with anticipated d/c to SNF for further skilled PT services.    Follow Up Recommendations  SNF;Supervision/Assistance - 24 hour     Equipment Recommendations  None recommended by PT (to be addressed at next venue)    Recommendations for Other Services       Precautions / Restrictions Precautions Precautions: Knee;Fall Required Braces or Orthoses: Knee Immobilizer - Right Knee Immobilizer - Right: Other (comment) (d/c once has strong SLR) Restrictions Weight Bearing Restrictions: Yes RLE Weight Bearing: Weight bearing as tolerated    Mobility  Bed Mobility Overal bed mobility: Needs Assistance Bed Mobility: Sidelying to Sit   Sidelying to sit: Min assist;HOB elevated       General bed mobility comments: assist to mobilize R LE and lower from EOB; HOB elevated and use of rail required; increased time and effort to elevate trunk into sittingn with assist to come up all the way  Transfers Overall transfer level: Needs assistance Equipment used: Rolling walker (2 wheeled) Transfers: Sit to/from Stand Sit to Stand: +2 physical assistance;Mod assist;From elevated surface         General transfer comment: cues for hand placement and technique; assist to power  up into standing and for balance upon stand  Ambulation/Gait Ambulation/Gait assistance: Max assist;+2 safety/equipment Ambulation Distance (Feet): 6 Feet Assistive device: Rolling walker (2 wheeled) Gait Pattern/deviations: Step-to pattern;Decreased stance time - right;Decreased step length - left;Decreased weight shift to right     General Gait Details: pt with R lateral lean and very unsteady with ambulation; assist for balance, weight shifting, and management of RW; cues for sequencing and weight shifting to correct lean   Stairs            Wheelchair Mobility    Modified Rankin (Stroke Patients Only)       Balance     Sitting balance-Leahy Scale: Fair       Standing balance-Leahy Scale: Poor Standing balance comment: needing use of rw and physical assist.                     Cognition Arousal/Alertness: Awake/alert Behavior During Therapy: WFL for tasks assessed/performed Overall Cognitive Status: No family/caregiver present to determine baseline cognitive functioning                 General Comments: pt was alert this session however does answer some questions inappropriately at times    Exercises      General Comments        Pertinent Vitals/Pain Pain Assessment: Faces Faces Pain Scale: Hurts little more Pain Location: Rt knee Pain Descriptors / Indicators: Guarding;Grimacing Pain Intervention(s): Limited activity within patient's tolerance;Monitored during session;Premedicated before session;Repositioned    Home Living  Prior Function            PT Goals (current goals can now be found in the care plan section) Acute Rehab PT Goals Patient Stated Goal: not expressed PT Goal Formulation: With patient Time For Goal Achievement: 01/31/16 Potential to Achieve Goals: Good Progress towards PT goals: Progressing toward goals    Frequency    7X/week      PT Plan Current plan remains appropriate     Co-evaluation             End of Session Equipment Utilized During Treatment: Gait belt;Right knee immobilizer Activity Tolerance: Patient tolerated treatment well (confusion) Patient left: with call bell/phone within reach;in chair;Other (comment) (R LE in zero degree foam)     Time: 1464-3142 PT Time Calculation (min) (ACUTE ONLY): 25 min  Charges:  $Gait Training: 8-22 mins $Therapeutic Activity: 8-22 mins                    G Codes:      Salina April, PTA Pager: [phone]   01/26/2016, 10:54 AM

## 2016-01-26 NOTE — Progress Notes (Signed)
Subjective: 2 Days Post-Op Procedure(s) (LRB): TOTAL KNEE ARTHROPLASTY (Right) Patient reports pain as moderate.    Objective: Vital signs in last 24 hours: Temp:  [97.4 F (36.3 C)-99.9 F (37.7 C)] 99.9 F (37.7 C) (12/14 0534) Pulse Rate:  [88-102] 93 (12/14 0534) Resp:  [14-20] 16 (12/14 0534) BP: (92-145)/(47-81) 102/51 (12/14 0534) SpO2:  [95 %-100 %] 99 % (12/14 0534) Weight:  [110.4 kg (243 lb 6.2 oz)-113.6 kg (250 lb 7.1 oz)] 110.4 kg (243 lb 6.2 oz) (12/14 0144)  Intake/Output from previous day: 12/13 0701 - 12/14 0700 In: 480 [P.O.:480] Out: 3459  Intake/Output this shift: No intake/output data recorded.   Recent Labs  01/24/16 0713 01/25/16 0347 01/26/16 0548  HGB 11.6* 11.2* 9.6*    Recent Labs  01/25/16 0347 01/26/16 0548  WBC 12.8* 14.0*  RBC 3.60* 3.14*  HCT 34.7* 30.2*  PLT 185 163    Recent Labs  01/24/16 0713 01/25/16 0347  NA 134* 134*  133*  K 4.1 5.6*  5.6*  CL  --  95*  94*  CO2  --  21*  21*  BUN  --  49*  50*  CREATININE  --  9.53*  9.53*  GLUCOSE 111* 145*  145*  CALCIUM  --  9.3  9.2    Recent Labs  01/25/16 0347 01/26/16 0548  INR 1.56 2.52    Neurologically intact ABD soft Neurovascular intact Sensation intact distally No cellulitis present Compartment soft  Assessment/Plan: 2 Days Post-Op Procedure(s) (LRB): TOTAL KNEE ARTHROPLASTY (Right) Advance diet Up with therapy Discharge to SNF likely tomorrow as dialysis needs to be coordinated.  Etherine Mackowiak L 01/26/2016, 7:50 AM

## 2016-01-27 DIAGNOSIS — M6281 Muscle weakness (generalized): Secondary | ICD-10-CM | POA: Diagnosis not present

## 2016-01-27 DIAGNOSIS — N189 Chronic kidney disease, unspecified: Secondary | ICD-10-CM | POA: Diagnosis not present

## 2016-01-27 DIAGNOSIS — I120 Hypertensive chronic kidney disease with stage 5 chronic kidney disease or end stage renal disease: Secondary | ICD-10-CM | POA: Diagnosis not present

## 2016-01-27 DIAGNOSIS — I4891 Unspecified atrial fibrillation: Secondary | ICD-10-CM | POA: Diagnosis not present

## 2016-01-27 DIAGNOSIS — E11319 Type 2 diabetes mellitus with unspecified diabetic retinopathy without macular edema: Secondary | ICD-10-CM | POA: Diagnosis not present

## 2016-01-27 DIAGNOSIS — Z8673 Personal history of transient ischemic attack (TIA), and cerebral infarction without residual deficits: Secondary | ICD-10-CM | POA: Diagnosis not present

## 2016-01-27 DIAGNOSIS — N186 End stage renal disease: Secondary | ICD-10-CM | POA: Diagnosis not present

## 2016-01-27 DIAGNOSIS — R509 Fever, unspecified: Secondary | ICD-10-CM | POA: Diagnosis not present

## 2016-01-27 DIAGNOSIS — E1129 Type 2 diabetes mellitus with other diabetic kidney complication: Secondary | ICD-10-CM | POA: Diagnosis not present

## 2016-01-27 DIAGNOSIS — N2581 Secondary hyperparathyroidism of renal origin: Secondary | ICD-10-CM | POA: Diagnosis not present

## 2016-01-27 DIAGNOSIS — I871 Compression of vein: Secondary | ICD-10-CM | POA: Diagnosis not present

## 2016-01-27 DIAGNOSIS — Z96651 Presence of right artificial knee joint: Secondary | ICD-10-CM | POA: Diagnosis not present

## 2016-01-27 DIAGNOSIS — F339 Major depressive disorder, recurrent, unspecified: Secondary | ICD-10-CM | POA: Diagnosis not present

## 2016-01-27 DIAGNOSIS — I2510 Atherosclerotic heart disease of native coronary artery without angina pectoris: Secondary | ICD-10-CM | POA: Diagnosis not present

## 2016-01-27 DIAGNOSIS — T8189XA Other complications of procedures, not elsewhere classified, initial encounter: Secondary | ICD-10-CM | POA: Diagnosis not present

## 2016-01-27 DIAGNOSIS — G894 Chronic pain syndrome: Secondary | ICD-10-CM | POA: Diagnosis not present

## 2016-01-27 DIAGNOSIS — I5042 Chronic combined systolic (congestive) and diastolic (congestive) heart failure: Secondary | ICD-10-CM | POA: Diagnosis not present

## 2016-01-27 DIAGNOSIS — Z452 Encounter for adjustment and management of vascular access device: Secondary | ICD-10-CM | POA: Diagnosis not present

## 2016-01-27 DIAGNOSIS — T85698A Other mechanical complication of other specified internal prosthetic devices, implants and grafts, initial encounter: Secondary | ICD-10-CM | POA: Diagnosis not present

## 2016-01-27 DIAGNOSIS — M25561 Pain in right knee: Secondary | ICD-10-CM | POA: Diagnosis not present

## 2016-01-27 DIAGNOSIS — E785 Hyperlipidemia, unspecified: Secondary | ICD-10-CM | POA: Diagnosis not present

## 2016-01-27 DIAGNOSIS — E1122 Type 2 diabetes mellitus with diabetic chronic kidney disease: Secondary | ICD-10-CM | POA: Diagnosis not present

## 2016-01-27 DIAGNOSIS — I132 Hypertensive heart and chronic kidney disease with heart failure and with stage 5 chronic kidney disease, or end stage renal disease: Secondary | ICD-10-CM | POA: Diagnosis not present

## 2016-01-27 DIAGNOSIS — T8249XA Other complication of vascular dialysis catheter, initial encounter: Secondary | ICD-10-CM | POA: Diagnosis not present

## 2016-01-27 DIAGNOSIS — M25569 Pain in unspecified knee: Secondary | ICD-10-CM | POA: Diagnosis not present

## 2016-01-27 DIAGNOSIS — Z992 Dependence on renal dialysis: Secondary | ICD-10-CM | POA: Diagnosis not present

## 2016-01-27 DIAGNOSIS — I272 Pulmonary hypertension, unspecified: Secondary | ICD-10-CM | POA: Diagnosis not present

## 2016-01-27 DIAGNOSIS — E1140 Type 2 diabetes mellitus with diabetic neuropathy, unspecified: Secondary | ICD-10-CM | POA: Diagnosis not present

## 2016-01-27 DIAGNOSIS — I509 Heart failure, unspecified: Secondary | ICD-10-CM | POA: Diagnosis not present

## 2016-01-27 DIAGNOSIS — D631 Anemia in chronic kidney disease: Secondary | ICD-10-CM | POA: Diagnosis not present

## 2016-01-27 DIAGNOSIS — D62 Acute posthemorrhagic anemia: Secondary | ICD-10-CM | POA: Diagnosis not present

## 2016-01-27 DIAGNOSIS — Z471 Aftercare following joint replacement surgery: Secondary | ICD-10-CM | POA: Diagnosis not present

## 2016-01-27 DIAGNOSIS — Z7901 Long term (current) use of anticoagulants: Secondary | ICD-10-CM | POA: Diagnosis not present

## 2016-01-27 DIAGNOSIS — K219 Gastro-esophageal reflux disease without esophagitis: Secondary | ICD-10-CM | POA: Diagnosis not present

## 2016-01-27 DIAGNOSIS — D689 Coagulation defect, unspecified: Secondary | ICD-10-CM | POA: Diagnosis not present

## 2016-01-27 DIAGNOSIS — T148XXA Other injury of unspecified body region, initial encounter: Secondary | ICD-10-CM | POA: Diagnosis not present

## 2016-01-27 DIAGNOSIS — E039 Hypothyroidism, unspecified: Secondary | ICD-10-CM | POA: Diagnosis not present

## 2016-01-27 DIAGNOSIS — R531 Weakness: Secondary | ICD-10-CM | POA: Diagnosis not present

## 2016-01-27 DIAGNOSIS — Y828 Other medical devices associated with adverse incidents: Secondary | ICD-10-CM | POA: Diagnosis not present

## 2016-01-27 DIAGNOSIS — G4733 Obstructive sleep apnea (adult) (pediatric): Secondary | ICD-10-CM | POA: Diagnosis not present

## 2016-01-27 DIAGNOSIS — M1711 Unilateral primary osteoarthritis, right knee: Secondary | ICD-10-CM | POA: Diagnosis not present

## 2016-01-27 DIAGNOSIS — T82858A Stenosis of vascular prosthetic devices, implants and grafts, initial encounter: Secondary | ICD-10-CM | POA: Diagnosis not present

## 2016-01-27 DIAGNOSIS — I10 Essential (primary) hypertension: Secondary | ICD-10-CM | POA: Diagnosis not present

## 2016-01-27 DIAGNOSIS — I480 Paroxysmal atrial fibrillation: Secondary | ICD-10-CM | POA: Diagnosis not present

## 2016-01-27 DIAGNOSIS — E871 Hypo-osmolality and hyponatremia: Secondary | ICD-10-CM | POA: Diagnosis not present

## 2016-01-27 DIAGNOSIS — I1310 Hypertensive heart and chronic kidney disease without heart failure, with stage 1 through stage 4 chronic kidney disease, or unspecified chronic kidney disease: Secondary | ICD-10-CM | POA: Diagnosis not present

## 2016-01-27 DIAGNOSIS — I5020 Unspecified systolic (congestive) heart failure: Secondary | ICD-10-CM | POA: Diagnosis not present

## 2016-01-27 DIAGNOSIS — Z96641 Presence of right artificial hip joint: Secondary | ICD-10-CM | POA: Diagnosis not present

## 2016-01-27 DIAGNOSIS — M1712 Unilateral primary osteoarthritis, left knee: Secondary | ICD-10-CM | POA: Diagnosis not present

## 2016-01-27 LAB — CBC
HCT: 26.5 % — ABNORMAL LOW (ref 39.0–52.0)
Hemoglobin: 8.6 g/dL — ABNORMAL LOW (ref 13.0–17.0)
MCH: 30.9 pg (ref 26.0–34.0)
MCHC: 32.5 g/dL (ref 30.0–36.0)
MCV: 95.3 fL (ref 78.0–100.0)
PLATELETS: 163 10*3/uL (ref 150–400)
RBC: 2.78 MIL/uL — AB (ref 4.22–5.81)
RDW: 14.3 % (ref 11.5–15.5)
WBC: 12 10*3/uL — ABNORMAL HIGH (ref 4.0–10.5)

## 2016-01-27 LAB — BASIC METABOLIC PANEL
ANION GAP: 14 (ref 5–15)
BUN: 47 mg/dL — ABNORMAL HIGH (ref 6–20)
CHLORIDE: 89 mmol/L — AB (ref 101–111)
CO2: 26 mmol/L (ref 22–32)
Calcium: 9.4 mg/dL (ref 8.9–10.3)
Creatinine, Ser: 10.06 mg/dL — ABNORMAL HIGH (ref 0.61–1.24)
GFR calc Af Amer: 6 mL/min — ABNORMAL LOW (ref >60)
GFR calc non Af Amer: 5 mL/min — ABNORMAL LOW (ref >60)
GLUCOSE: 165 mg/dL — AB (ref 65–99)
POTASSIUM: 5 mmol/L (ref 3.5–5.1)
Sodium: 129 mmol/L — ABNORMAL LOW (ref 135–145)

## 2016-01-27 LAB — PHOSPHORUS: Phosphorus: 8.3 mg/dL — ABNORMAL HIGH (ref 2.5–4.6)

## 2016-01-27 LAB — PROTIME-INR
INR: 2.82
Prothrombin Time: 30.2 seconds — ABNORMAL HIGH (ref 11.4–15.2)

## 2016-01-27 LAB — ALBUMIN: Albumin: 2.6 g/dL — ABNORMAL LOW (ref 3.5–5.0)

## 2016-01-27 LAB — GLUCOSE, CAPILLARY: Glucose-Capillary: 137 mg/dL — ABNORMAL HIGH (ref 65–99)

## 2016-01-27 MED ORDER — WARFARIN SODIUM 3 MG PO TABS
3.0000 mg | ORAL_TABLET | Freq: Once | ORAL | Status: DC
  Filled 2016-01-27: qty 1

## 2016-01-27 MED ORDER — SODIUM CHLORIDE 0.9 % IV SOLN: 100.0000 mL | INTRAVENOUS | Status: DC | PRN

## 2016-01-27 MED ORDER — LIDOCAINE HCL (PF) 1 % IJ SOLN: 5.0000 mL | INTRAMUSCULAR | Status: DC | PRN

## 2016-01-27 MED ORDER — LIDOCAINE-PRILOCAINE 2.5-2.5 % EX CREA
1.0000 "application " | TOPICAL_CREAM | CUTANEOUS | Status: DC | PRN
  Filled 2016-01-27: qty 5

## 2016-01-27 MED ORDER — DOXERCALCIFEROL 4 MCG/2ML IV SOLN
INTRAVENOUS | Status: AC
  Administered 2016-01-27: 1 ug via INTRAVENOUS
  Filled 2016-01-27: qty 2

## 2016-01-27 MED ORDER — ALTEPLASE 2 MG IJ SOLR: 2.0000 mg | Freq: Once | INTRAMUSCULAR | Status: DC | PRN

## 2016-01-27 MED ORDER — PENTAFLUOROPROP-TETRAFLUOROETH EX AERO: 1.0000 "application " | INHALATION_SPRAY | CUTANEOUS | Status: DC | PRN

## 2016-01-27 MED ORDER — OXYCODONE-ACETAMINOPHEN 5-325 MG PO TABS: 1.0000 | ORAL_TABLET | Freq: Three times a day (TID) | ORAL | 0 refills | Status: DC | PRN

## 2016-01-27 MED ORDER — HEPARIN SODIUM (PORCINE) 1000 UNIT/ML DIALYSIS
1000.0000 [IU] | INTRAMUSCULAR | Status: DC | PRN
  Filled 2016-01-27: qty 1

## 2016-01-27 MED ORDER — DARBEPOETIN ALFA 40 MCG/0.4ML IJ SOSY
PREFILLED_SYRINGE | INTRAMUSCULAR | Status: AC
  Administered 2016-01-27: 40 ug via INTRAVENOUS
  Filled 2016-01-27: qty 0.4

## 2016-01-27 MED ORDER — OXYCODONE HCL 5 MG PO TABS
ORAL_TABLET | ORAL | Status: AC
  Administered 2016-01-27: 10 mg via ORAL
  Filled 2016-01-27: qty 2

## 2016-01-27 NOTE — Progress Notes (Signed)
[facility] Progress Note   Subjective:  BP's low on HD, got coreg this morning pre HD unfortunately  Objective Vitals:   01/27/16 1145 01/27/16 1200 01/27/16 1230 01/27/16 1300  BP: (!) 97/46 (!) 96/57 (!) 108/50 (!) 95/50  Pulse: 82 97 94 89  Resp:      Temp:      TempSrc:      SpO2:      Weight:      Height:       Physical Exam General: WNWD very pleasant, NAD Heart: regularly irregular Lungs: CTAB A/P Abdomen: S,NT Extremities: Knee immobilizer RLE, No edema LLE Dialysis Access: LFA AVF + bruit   Additional Objective Labs: Basic Metabolic Panel:  Recent Labs Lab 01/24/16 0713 01/25/16 0347 01/27/16 1001 01/27/16 1030  NA 134* 134*  133* 129*  --   K 4.1 5.6*  5.6* 5.0  --   CL  --  95*  94* 89*  --   CO2  --  21*  21* 26  --   GLUCOSE 111* 145*  145* 165*  --   BUN  --  49*  50* 47*  --   CREATININE  --  9.53*  9.53* 10.06*  --   CALCIUM  --  9.3  9.2 9.4  --   PHOS  --  8.2*  --  8.3*   Liver Function Tests:  Recent Labs Lab 01/25/16 0347 01/27/16 1030  ALBUMIN 3.2* 2.6*   No results for input(s): LIPASE, AMYLASE in the last 168 hours. CBC:  Recent Labs Lab 01/25/16 0347 01/26/16 0548 01/27/16 0510  WBC 12.8* 14.0* 12.0*  HGB 11.2* 9.6* 8.6*  HCT 34.7* 30.2* 26.5*  MCV 96.4 96.2 95.3  PLT 185 163 163   Blood Culture    Component Value Date/Time   SDES URINE, CATHETERIZED 08/25/2014 0641   SPECREQUEST NONE 08/25/2014 0641   CULT 20,000 COLONIES/mL ESCHERICHIA COLI 08/25/2014 0641   REPTSTATUS 08/27/2014 FINAL 08/25/2014 0641    Cardiac Enzymes: No results for input(s): CKTOTAL, CKMB, CKMBINDEX, TROPONINI in the last 168 hours. CBG:  Recent Labs Lab 01/24/16 1129 01/27/16 0419  GLUCAP 189* 137*   Iron Studies: No results for input(s): IRON, TIBC, TRANSFERRIN, FERRITIN in the last 72 hours. @lablastinr3 @ Studies/Results: No results found. Medications:  . allopurinol  100 mg Oral BID  .  amitriptyline  75 mg Oral QHS  . carvedilol  25 mg Oral BID WC  . darbepoetin (ARANESP) injection - DIALYSIS  40 mcg Intravenous Q Fri-HD  . docusate sodium  100 mg Oral BID  . doxercalciferol  1 mcg Intravenous Q M,W,F-HD  . gabapentin  100 mg Oral TID  . HYDROcodone-acetaminophen  1 tablet Oral Q6H  . multivitamin  1 tablet Oral QHS  . sevelamer carbonate  2,400 mg Oral TID WC  . sevelamer carbonate  800 mg Oral With snacks  . simvastatin  20 mg Oral q1800  . warfarin  3 mg Oral ONCE-1800  . Warfarin - Pharmacist Dosing Inpatient   Does not apply q1800     Dialysis Orders: [facility] MWF 4 hours 450/800 111.5 kg 2.0 K/2.5 Ca Linear Na Heparin 3000 units IV q treatment Hectoral 1 mcg IV q tx (Last PTH 285 12/07/15) Recent Labs: HGB 11.6 (01/18/16) Phos 4.5 Ca 9.5 C Ca 9.6 Albumin 3.9 (01/03/16)  BMD medications: Renvela 800 mg 3 tablets PO TID w/meals 1 tablet with snack.  Assessment: 1. Osteoarthritis R Knee/Total knee Arthoplasty: Per Dr. Berenice Primas. Stable  2. ESRD -MWF via LFA AVF. K+ 5.6 01/25/16 prior to HD. For HD tomorrow No heparin. Do 1st shift 3. Anemia - HGB 9.6. No OP ESA. Follow HGB. PT IS [name]-NO BLOOD PRODUCTS! Will watch HGB, if still falling, will start Aranesp tomorrow.  4. Secondary hyperparathyroidism - Phos 8.2 Ca 9.53 C Ca 8.84. Continue binders, VDRA 5. HTN/volume - up 1kg, BP's soft, got coreg this am 6. Nutrition -Renal diet/Fld restrictions reinforced. Renal vit/nepro. 7. H/O Afib: On coumadin per pharmacy 8. H/O Gout: Allopurinol resumed.  Disposition: for DC today after HD.    Kelly Splinter MD [employer] pager [phone]   01/27/2016, 1:33 PM

## 2016-01-27 NOTE — Progress Notes (Signed)
Dialysis treatment completed.  1200 mL ultrafiltrated and net fluid removal 500 mL.    Patient status unchanged. Lung sounds diminished to ausculation in all fields. Generalized edema. Cardiac: NSR.  Disconnected lines and removed needles.  Pressure held for 10 minutes and band aid/gauze dressing applied.  Report given to bedside RN, Suanne Marker.

## 2016-01-27 NOTE — Discharge Summary (Signed)
Patient ID: Daniel Whitney MRN: [id number] DOB/AGE: January 26, 1956 60 y.o.  Admit date: 01/24/2016 Discharge date: 01/27/2016  Admission Diagnoses:  Active Problems:   S/P knee replacement   Postoperative anemia due to acute blood loss   Discharge Diagnoses:  Same  Past Medical History:  Diagnosis Date  . Allergic rhinitis 07/07/2014  . Anemia due to other cause 07/07/2014  . Arthritis    "all over"  . Atrial fibrillation ([place]) 12/30/2012  . CHF (congestive heart failure) ([place]) 12/30/2012  . Chronic lower back pain   . Diabetes mellitus with neuropathy ([place])   . Diabetic retinopathy ([place]) 12/30/2012   no medications  . Dysrhythmia    Afib  . ESRD (end stage renal disease) on dialysis ([place]) started in 2013   MWF; [facility]; [facility]  . GERD (gastroesophageal reflux disease)   . Glaucoma   . History of gout    "right big toe"  . Hyperlipidemia   . Hypertension   . Hypothyroid    01/17/16- no longer on meduication  . Morbid obesity ([place]) 12/30/2012  . Myocardial infarction    "I've had ~ 3; last one was in ~ 10/2013" (01/30/2014)  . Neuropathy ([place])   . OSA (obstructive sleep apnea) 12/30/2012   "lost weight; no longer needed CPAP; retested said I needed it; didn't followup cause I was feeling fine" (01/30/2014)  . Pulmonary hypertension 12/30/2012  . Refusal of blood transfusions as patient is Jehovah's Witness   . Stroke [facility]) ~ 2005; ~ 2005   "they were mild; I didn't even notice I'd had them"; denies residual on 01/30/2014    Surgeries: Procedure(s):Right TOTAL KNEE ARTHROPLASTY on 01/24/2016   Consultants:  Roney Jaffe M.D.  nephrology  Discharged Condition: Improved  Hospital Course: Daniel Whitney is an 60 y.o. male who was admitted 01/24/2016 for operative treatment of<principal problem not specified>. Patient has severe unremitting pain that affects sleep, daily activities, and work/hobbies. After pre-op clearance the patient was taken to the operating room  on 01/24/2016 and underwent  Procedure(s): Right TOTAL KNEE ARTHROPLASTY.    Patient was given perioperative antibiotics:  Anti-infectives    Start     Dose/Rate Route Frequency Ordered Stop   01/25/16 1200  ceFAZolin (ANCEF) IVPB 1 g/50 mL premix     1 g 100 mL/hr over 30 Minutes Intravenous Every 12 hours 01/25/16 1132 01/26/16 0144   01/24/16 1730  ceFAZolin (ANCEF) IVPB 1 g/50 mL premix     1 g 100 mL/hr over 30 Minutes Intravenous  Once 01/24/16 1716 01/24/16 1801   01/24/16 1500  ceFAZolin (ANCEF) IVPB 1 g/50 mL premix  Status:  Discontinued     1 g 100 mL/hr over 30 Minutes Intravenous Every 6 hours 01/24/16 1229 01/24/16 1716   01/24/16 1115  ceFAZolin (ANCEF) IVPB 2g/100 mL premix  Status:  Discontinued     2 g 200 mL/hr over 30 Minutes Intravenous On call to O.R. 01/24/16 1100 01/24/16 1100   01/24/16 0957  cefUROXime (ZINACEF) injection  Status:  Discontinued       As needed 01/24/16 0957 01/24/16 1059   01/24/16 0536  ceFAZolin (ANCEF) IVPB 2g/100 mL premix     2 g 200 mL/hr over 30 Minutes Intravenous On call to O.R. 01/24/16 0536 01/24/16 0933       Patient was given sequential compression devices, early ambulation, and chemoprophylaxis to prevent DVT.The patient was seen by the renal service and their help is greatly appreciated. He had dialysis while in the  hospital. On early a.m. of postop day #3 had some somnolence and was easily arousable his vital signs are stable febrile. He ultimately did fine discuss to discharging the patient with Dr. [name] to skilled nursing facility and he felt the patient was doing very well from a medical/renal viewpoint. At the time of discharge his vital signs are stable and he was afebrile. His right knee dressing was clean and dry and his calf is soft.  Patient benefited maximally from hospital stay and there were no complications.    Recent vital signs:  Patient Vitals for the past 24 hrs:  BP Temp Temp src Pulse Resp SpO2 Weight   01/27/16 1300 (!) 95/50 - - 89 - - -  01/27/16 1230 (!) 108/50 - - 94 - - -  01/27/16 1200 (!) 96/57 - - 97 - - -  01/27/16 1145 (!) 97/46 - - 82 - - -  01/27/16 1130 (!) 97/50 - - 83 - - -  01/27/16 1115 (!) 98/49 - - 77 - - -  01/27/16 1100 (!) 84/52 - - 80 - - -  01/27/16 1037 (!) 95/52 - - 95 - - -  01/27/16 1029 (!) 99/55 98.7 F (37.1 C) - 90 14 100 % 110 kg (242 lb 8.1 oz)  01/27/16 0412 (!) 102/53 98.9 F (37.2 C) Oral 80 16 100 % -  01/26/16 2103 (!) 97/54 (!) 100.5 F (38.1 C) Oral 88 14 100 % -  01/26/16 1500 (!) 95/52 98.7 F (37.1 C) Oral 90 - 98 % -     Recent laboratory studies:   Recent Labs  01/25/16 0347 01/26/16 0548 01/27/16 0510 01/27/16 1001  WBC 12.8* 14.0* 12.0*  --   HGB 11.2* 9.6* 8.6*  --   HCT 34.7* 30.2* 26.5*  --   PLT 185 163 163  --   NA 134*  133*  --   --  129*  K 5.6*  5.6*  --   --  5.0  CL 95*  94*  --   --  89*  CO2 21*  21*  --   --  26  BUN 49*  50*  --   --  47*  CREATININE 9.53*  9.53*  --   --  10.06*  GLUCOSE 145*  145*  --   --  165*  INR 1.56 2.52 2.82  --   CALCIUM 9.3  9.2  --   --  9.4     Discharge Medications:   Allergies as of 01/27/2016      Reactions   No Known Allergies       Medication List    STOP taking these medications   HYDROcodone-acetaminophen 5-325 MG tablet Commonly known as:  NORCO/VICODIN     TAKE these medications   acetaminophen 650 MG CR tablet Commonly known as:  TYLENOL Take 650 mg by mouth every 8 (eight) hours as needed for pain.   acetaminophen 325 MG tablet Commonly known as:  TYLENOL Take 325 mg by mouth 2 (two) times daily as needed (for pain.).   allopurinol 100 MG tablet Commonly known as:  ZYLOPRIM Take 1 tablet (100 mg total) by mouth 2 (two) times daily.   allopurinol 100 MG tablet Commonly known as:  ZYLOPRIM TAKE ONE TABLET BY MOUTH TWICE DAILY   amitriptyline 50 MG tablet Commonly known as:  ELAVIL Take 50 mg by mouth at bedtime.   amitriptyline  75 MG tablet Commonly known as:  ELAVIL Take  1 tablet (75 mg total) by mouth at bedtime.   carvedilol 25 MG tablet Commonly known as:  COREG Take 25 mg by mouth as directed. Take 1 tablet (25 MG) in the morning ONLY on Sunday, Tuesday, & Thursday Take 1 tablet (25 MG) twice daily on Monday, Wednesday, Friday, & Saturday   DAILY VITE PO Take 1 tablet by mouth daily.   dextromethorphan-guaiFENesin 30-600 MG 12hr tablet Commonly known as:  MUCINEX DM Take 1 tablet by mouth 2 (two) times daily.   glucose blood test strip Commonly known as:  ONE TOUCH ULTRA TEST 1 each by Other route 2 (two) times daily. Use to check blood sugars twice a day Dx E11.9   loperamide 2 MG tablet Commonly known as:  IMODIUM A-D Take 2 mg by mouth 4 (four) times daily as needed for diarrhea or loose stools.   ONE TOUCH ULTRA 2 w/Device Kit Use to check blood sugars twice a day Dx E11.9   onetouch ultrasoft lancets 1 each by Other route 2 (two) times daily. Use to help check blood sugars twice a day Dx E11.9   oxyCODONE-acetaminophen 5-325 MG tablet Commonly known as:  PERCOCET/ROXICET Take 1 tablet by mouth every 8 (eight) hours as needed for severe pain.   ranitidine 150 MG tablet Commonly known as:  ZANTAC Take 150 mg by mouth daily as needed (heartburn/acid reflux.). Reported on 02/03/2015   sevelamer carbonate 800 MG tablet Commonly known as:  RENVELA Take 1,600 mg by mouth 3 (three) times daily with meals.   simvastatin 20 MG tablet Commonly known as:  ZOCOR TAKE ONE TABLET BY MOUTH ONCE DAILY   sodium chloride 0.65 % Soln nasal spray Commonly known as:  OCEAN Place 1 spray into both nostrils as needed for congestion.   tiZANidine 2 MG tablet Commonly known as:  ZANAFLEX Take 1 tablet (2 mg total) by mouth every 8 (eight) hours as needed for muscle spasms.   warfarin 3 MG tablet Commonly known as:  COUMADIN Please take as directed by anticoagulation clinic. What changed:  how much  to take  how to take this  when to take this  additional instructions       Diagnostic Studies: Dg Chest 2 View  Result Date: 01/17/2016 CLINICAL DATA:  Preop.  Upcoming right knee arthroplasty. EXAM: CHEST  2 VIEW COMPARISON:  11/24/2015 FINDINGS: The cardiac silhouette remains slightly enlarged, unchanged. No confluent airspace opacity, pulmonary edema, pleural effusion, or pneumothorax is identified. Thoracic spondylosis is noted. IMPRESSION: Mild cardiomegaly without evidence of acute cardiopulmonary process. Electronically Signed   By: Logan Bores M.D.   On: 01/17/2016 11:28   Dg Chest Port 1 View  Result Date: 01/24/2016 CLINICAL DATA:  Status post central line placement. EXAM: PORTABLE CHEST 1 VIEW COMPARISON:  01/17/2016 FINDINGS: Status post placement of left jugular central line with the catheter tip in the upper SVC. No pneumothorax. Lung volumes are low with bibasilar atelectasis present. No overt edema or pleural fluid identified. The heart size is within normal limits. Stable tortuosity of the thoracic aorta. IMPRESSION: Newly placed left jugular central line with catheter tip in upper SVC. No pneumothorax identified. Electronically Signed   By: Aletta Edouard M.D.   On: 01/24/2016 14:28    Disposition: 01-Home or Self Care  Discharge Instructions    CPM    Complete by:  As directed    Continuous passive motion machine (CPM):      Use the CPM from 0 to 60 for  8 hours per day.      You may increase by 5-10 per day.  You may break it up into 2 or 3 sessions per day.      Use CPM for 1-2 weeks or until you are told to stop.   Call MD / Call 911    Complete by:  As directed    If you experience chest pain or shortness of breath, CALL 911 and be transported to the hospital emergency room.  If you develope a fever above 101 F, pus (white drainage) or increased drainage or redness at the wound, or calf pain, call your surgeon's office.   Constipation Prevention     Complete by:  As directed    Drink plenty of fluids.  Prune juice may be helpful.  You may use a stool softener, such as Colace (over the counter) 100 mg twice a day.  Use MiraLax (over the counter) for constipation as needed.   Diet Carb Modified    Complete by:  As directed    Do not put a pillow under the knee. Place it under the heel.    Complete by:  As directed    Increase activity slowly as tolerated    Complete by:  As directed    Weight bearing as tolerated    Complete by:  As directed    Laterality:  right   Extremity:  Lower    The right knee dressing can be left in place until follow-up with Dr. Berenice Primas in the office in 2 weeks.   Contact information for follow-up providers    GRAVES,JOHN L, MD. Schedule an appointment as soon as possible for a visit in 2 weeks.   Specialty:  Orthopedic Surgery Contact information: [place] [address] [phone]            Contact information for after-discharge care    Destination    [facility] .   Specialty:  [place] information: [address] [place] [phone]                   Signed: Erlene Senters 01/27/2016, 1:47 PM

## 2016-01-27 NOTE — Progress Notes (Signed)
Ortho PA in and requested that a BMET be ordered for pt , order entered. Also requested that the renal PA be called to see pt and make sure that pt is scheduled for dialysis today before discharge to SNF.  Spoke with renal PA Katie and she states that pt is on the dialysis schedule this am for 11:00 and she will see pt at that time. [phone])

## 2016-01-27 NOTE — Clinical Social Work Note (Signed)
Pt gave verbal permission for CSW to determine which facility out of bed offers (that will provide transport to and from dialysis) is closest to his house. The facility closest to pt's address is [facility]. CSW notified facility. Bed available for pt. Pt has dialysis at 11:00. CSW notified facility it would be a later admission.   [address] [place]

## 2016-01-27 NOTE — Consult Note (Addendum)
            [facility] CM Primary Care Navigator  01/27/2016  Daniel Whitney 1955-04-03 [id number]  Went to see patient at the bedside to identify possible discharge needs but nursing staff states that patient is in dialysis at the moment. Will try another time to meet with patient when available.  Inpatient CSW note states: Per PA, pt will discharge today after dialysis. Pt will be going to [facility]. In basket note sent to [facility]. for follow-up at [facility] post acute.  For additional questions please contact:  Edwena Felty A. Jeffrey Voth, BSN, RN-BC [facility] PRIMARY CARE Navigator Cell: [phone]

## 2016-01-27 NOTE — Clinical Social Work Note (Addendum)
Per PA pt will d/c today after dialysis. Pt will be going to [place], facility notified. Number for nurse to call reports: [phone].   [address] [place]

## 2016-01-27 NOTE — Progress Notes (Signed)
Patient arrived to unit per bed.  Reviewed treatment plan and this RN agrees.  Report received from bedside RN, Suanne Marker.  Consent verified.  Patient sleepy, arouses to voice. Lung sounds diminished to ausculation in all fields. Generalized edema. Cardiac: NSR.  Prepped LLAVF with alcohol and cannulated with two 15 gauge needles.  Pulsation of blood noted.  Flushed access well with saline per protocol.  Connected and secured lines and initiated tx at 1037.  UF goal of 2000 mL and net fluid removal of 1500 mL.  Will continue to monitor.

## 2016-01-27 NOTE — Progress Notes (Signed)
Subjective: 3 Days Post-Op Procedure(s) (LRB): TOTAL KNEE ARTHROPLASTY (Right) Patient reports pain as moderate. Patient had episode of difficulty to arouse early this a.m. He was seen by rapid response. His vital signs were stable and his O2 sats were excellent. This morning he seems much improved. He still was somewhat somnolent but easily arousable once he was awake he was answering questions appropriately related to his name, date of birth, and location. He is currently getting dialysis. He currently denies chest pain or shortness of breath.  Objective: Vital signs in last 24 hours: Temp:  [98.7 F (37.1 C)-100.5 F (38.1 C)] 98.7 F (37.1 C) (12/15 1029) Pulse Rate:  [77-97] 89 (12/15 1300) Resp:  [14-16] 14 (12/15 1029) BP: (84-108)/(46-57) 95/50 (12/15 1300) SpO2:  [98 %-100 %] 100 % (12/15 1029) Weight:  [110 kg (242 lb 8.1 oz)] 110 kg (242 lb 8.1 oz) (12/15 1029)  Intake/Output from previous day: 12/14 0701 - 12/15 0700 In: 730 [P.O.:720; I.V.:10] Out: -  Intake/Output this shift: No intake/output data recorded.   Recent Labs  01/25/16 0347 01/26/16 0548 01/27/16 0510  HGB 11.2* 9.6* 8.6*    Recent Labs  01/26/16 0548 01/27/16 0510  WBC 14.0* 12.0*  RBC 3.14* 2.78*  HCT 30.2* 26.5*  PLT 163 163    Recent Labs  01/25/16 0347 01/27/16 1001  NA 134*  133* 129*  K 5.6*  5.6* 5.0  CL 95*  94* 89*  CO2 21*  21* 26  BUN 49*  50* 47*  CREATININE 9.53*  9.53* 10.06*  GLUCOSE 145*  145* 165*  CALCIUM 9.3  9.2 9.4    Recent Labs  01/26/16 0548 01/27/16 0510  INR 2.52 2.82  Right knee exam: Aquacel dressing is clean and dry. His calf is soft and nontender. He moves his foot actively. Normal sensation distally. He has moderate pain with range of motion.    Assessment/Plan: 3 Days Post-Op Procedure(s) (LRB): TOTAL KNEE ARTHROPLASTY (Right) Acute blood loss anemia, expected. Plan: Up with therapy Discharge to SNF He will have dialysis today.  He can be discharged to [facility] skilled nursing facility after dialysis. Would minimize narcotics. He can use Percocet 5 mg one every 6-8 hours as needed for pain. He has resumed his preoperative Coumadin dose. The Aquacell dressing will stay in place until seen in the office in 2 weeks. Recheck with Dr. Berenice Primas in 2 weeks. Weight-bear as tolerated on right. He will need daily physical therapy with walker ambulation and aggressive right knee range of motion.  Nazario Russom G 01/27/2016, 1:33 PM

## 2016-01-27 NOTE — Clinical Social Work Note (Signed)
Clinical Social Worker facilitated patient discharge including contacting patient family and facility to confirm patient discharge plans.  Clinical information faxed to facility and family agreeable with plan.  CSW arranged ambulance transport via [facility] to [place] .  RN to call report prior to discharge.  Clinical Social Worker will sign off for now as social work intervention is no longer needed. Please consult [place] again if new need arises.  [address], [place]

## 2016-01-27 NOTE — Progress Notes (Signed)
Physical Therapy Progress Note  Patient with limited mobility this session and confusion. Pt demonstrated uncontrollable "jerking" of bilat UE and trunk and unable to safely use AD. RN notified of pt's change in behavior and mobility versus previous session. Continue to progress as tolerated with anticipated d/c to SNF for further skilled PT services.     01/27/16 1029  PT Visit Information  Last PT Received On 01/27/16  Assistance Needed +2  History of Present Illness 60 y.o. male admitted to [facility] on 01/24/16 for elective R TKA.  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  Per pt self report R RTC tear (not listed in chart) and very bad left knee arthritis.    Subjective Data  Patient Stated Goal not expressed  Precautions  Precautions Knee;Fall  Required Braces or Orthoses Knee Immobilizer - Right  Knee Immobilizer - Right Other (comment) (d/c once has strong SLR)  Restrictions  Weight Bearing Restrictions Yes  RLE Weight Bearing WBAT  Pain Assessment  Pain Assessment Faces  Faces Pain Scale 4  Pain Location Rt knee  Pain Descriptors / Indicators Guarding;Grimacing  Pain Intervention(s) Limited activity within patient's tolerance;Monitored during session;Premedicated before session;Repositioned  Cognition  Arousal/Alertness Awake/alert  Behavior During Therapy WFL for tasks assessed/performed  Overall Cognitive Status No family/caregiver present to determine baseline cognitive functioning  Area of Impairment Following commands;Orientation;Memory  Orientation Level Disoriented to;Time;Situation  Memory Decreased short-term memory;Decreased recall of precautions  Following Commands Follows one step commands inconsistently;Follows one step commands  with increased time  General Comments pt confused this session and reported "they haven't done surgery on my knee yet" and believes it is Tuesday; pt also unable to follow commands consistently  Bed Mobility  Overal bed mobility Needs Assistance  Bed Mobility Sidelying to Sit;Rolling;Sit to Supine  Rolling Min assist  Sidelying to sit HOB elevated;Mod assist;+2 for physical assistance  Sit to supine Max assist;+2 for physical assistance  General bed mobility comments assist for all aspects of bed mobility this session; cues for sequencing and technique; pt unable to assist as much as previous session  Transfers  General transfer comment pt required assist for sitting balance EOB and attempts to transfer deferred due to pt's bilat UE and trunk "jerking" movement and inability to maintain grasp on RW; max A +2 to scoot pt toward head of bed as pt was unable to follow commands correctly  Balance  Sitting balance-Leahy Scale Poor  General Comments  General comments (skin integrity, edema, etc.) pt confused at times throughout session and diaphoretic however VSS  PT - End of Session  Equipment Utilized During Treatment Gait belt;Right knee immobilizer  Activity Tolerance Other (comment) (confusion)  Patient left with call bell/phone within reach;in bed;with bed alarm set  Nurse Communication Mobility status;Other (comment) (change in behavior and decreased mobility/jerking)  PT - Assessment/Plan  PT Plan Current plan remains appropriate  PT Frequency (ACUTE ONLY) 7X/week  Follow Up Recommendations SNF;Supervision/Assistance - 24 hour  PT equipment None recommended by PT (to be addressed at next venue)  PT Goal Progression  Progress towards PT goals Not progressing toward goals - comment  PT Time Calculation  PT Start Time (ACUTE ONLY) 0943  PT Stop Time (ACUTE ONLY) 1009  PT Time Calculation (min) (ACUTE ONLY) 26  min  PT General Charges  $$ ACUTE PT VISIT 1 Procedure  PT Treatments   $Therapeutic Activity 23-37 mins   Earney Navy, PTA Pager: [phone]

## 2016-01-27 NOTE — Progress Notes (Signed)
Asked to see Pt while rounding on floor. RN concerned Pt is very difficult to arouse this morning. Has not received pain meds since day shift 12/14. CBG 137. Pt opens eyes to sternal rub able to state name and follow simple commands. MAE x 4, nods head no to pain. Pt falls asleep easily, no respiratory distress, protecting airway well. VSS 102/53 HR 100s 16 rr, 100% on 3 L [place]. EKG showing slight ST changes in V leads, completed after dinamap monitor read HR incorrectly at 32 bpm.  R knee dressing CDI secure with immobilizer. [facility] on call paged and updated per floor RN, no new orders at this time. RN advised to monitor Pt closely and update Provider and myself for worsening changes. Continuous pulse oximeter on and reading 97-100%. Will follow.

## 2016-01-27 NOTE — Progress Notes (Signed)
Report called to [facility] on [address]. Pt to be transported to this facility this pm. No distress noted. All personal belongings with pt. Rx with transport as well as orders. Pt dialyzed this am at [facility] and tolerated well.

## 2016-01-27 NOTE — Progress Notes (Signed)
ANTICOAGULATION CONSULT NOTE - Follow Up Consult  Pharmacy Consult for Coumadin Indication: atrial fibrillation  Allergies  Allergen Reactions  . No Known Allergies     Patient Measurements: Height: 5\' 9"  (175.3 cm) Weight: 243 lb 6.2 oz (110.4 kg) IBW/kg (Calculated) : 70.7   Vital Signs: Temp: 98.9 F (37.2 C) (12/15 0412) Temp Source: Oral (12/15 0412) BP: 102/53 (12/15 0412) Pulse Rate: 80 (12/15 0412)  Labs:  Recent Labs  01/25/16 0347 01/26/16 0548 01/27/16 0510  HGB 11.2* 9.6* 8.6*  HCT 34.7* 30.2* 26.5*  PLT 185 163 163  LABPROT 18.8* 27.6* 30.2*  INR 1.56 2.52 2.82  CREATININE 9.53*  9.53*  --   --     Estimated Creatinine Clearance: 10 mL/min (by C-G formula based on SCr of 9.53 mg/dL (H)).   Assessment: 60 yo m presenting with right knee pain - admission for knee replacement  PMH: Afib, HF, ESRD HD, HTN, DM  Anticoag: warfarin for Afib and VTE px s/p right TKA 12/12 - Admit INR 1.43- INR today 2.82- reflecting 5mg  on 12/13.  Hg down some, pod#2 and plt wnl.  *home dose: 3mg  daily except 4.5mg  on Thurs, last dose 12/9  Goal of Therapy:  INR 2-3 Monitor platelets by anticoagulation protocol: Yes    Plan:  - Repeat Coumadin 3 mg PO today - Daily PT / INR - CBC q72h - Plan d/c to SNF today, HD prior   Gracy Bruins, PharmD [facility]

## 2016-01-28 ENCOUNTER — Encounter: Payer: Self-pay | Admitting: Internal Medicine

## 2016-01-28 ENCOUNTER — Non-Acute Institutional Stay (SKILLED_NURSING_FACILITY): Payer: Medicare Other | Admitting: Internal Medicine

## 2016-01-28 DIAGNOSIS — I480 Paroxysmal atrial fibrillation: Secondary | ICD-10-CM | POA: Diagnosis not present

## 2016-01-28 DIAGNOSIS — E871 Hypo-osmolality and hyponatremia: Secondary | ICD-10-CM | POA: Diagnosis not present

## 2016-01-28 DIAGNOSIS — Z96651 Presence of right artificial knee joint: Secondary | ICD-10-CM

## 2016-01-28 DIAGNOSIS — N189 Chronic kidney disease, unspecified: Secondary | ICD-10-CM

## 2016-01-28 DIAGNOSIS — D631 Anemia in chronic kidney disease: Secondary | ICD-10-CM

## 2016-01-28 LAB — POCT INR: INR: 3 — AB (ref 0.9–1.1)

## 2016-01-28 LAB — PROTIME-INR: PROTIME: 31 s — AB (ref 10.0–13.8)

## 2016-01-28 NOTE — Progress Notes (Signed)
This is a comprehensive admission note to [facility] performed on this date less than 30 days from date of admission. Included are preadmission medical/surgical history;reconciled medication list; family history; social history and comprehensive review of systems.  Corrections and additions to the records were documented . Comprehensive physical exam was also performed. Additionally a clinical summary was entered for each active diagnosis pertinent to this admission in the Problem List to enhance continuity of care.  [facility] [address]  HPI: Patient was hospitalized 12/12-12/15/17 for right total knee replacement, completed 12/12.  DVT prophylaxis was with compression devices and early ambulation was pursued. He is on warfarin also in the context of a fib. He received dialysis while hospitalized. The morning of postop day 3 he was somnolent but was easily arousable. His vital signs were stable allowing discharge to the nursing home for PT/OT. Significant labs include a creatinine of 10.06 the day of discharge. Sodium was 129. EKG 01/27/16 revealed new onset atrial fib. As noted he is on warfarin. Postoperatively hemoglobin dropped to 8.6  & hematocrit 26.5.  Past medical and surgical history: Includes history of stroke approximate date 2005,  sleep apnea w/o CPAP, diabetic neuropathy, myocardial infarction, hypothyroidism, hypertension, dyslipidemia, history of gout, glaucoma, GERD, and transitory atrial fibrillation. He has dialysis 3 days a week. This accounts for the atypical administration of the carvedilol(see Med List). He's also had total hip arthroplasty, catheterization, and AV fistula placement for dialysis. Social history: Reviewed. He drinks occasionally.He is a Restaurant manager, fast food.  Family history: Updated  Review of systems: Positive symptoms include tingling in the extremities. He describes chronic sinus congestion without symptoms or signs of  sinusitis. He was vague about checking sugars at home. He states early in the morning  they would be in the mid 70s. He denies any symptoms of hypoglycemia. He gave the date as 01/25/2016. He knew the president's name. He had trouble recalling names of his Orthopedic surgeon or his primary care physician. Nursing has noted slight confusion also. Amitriptyline was Rxed for anxiety.  Constitutional: No fever,significant weight change, fatigue  Eyes: No redness, discharge, pain, vision change ENT/mouth: No purulent discharge, earache,change in hearing ,sore throat  Cardiovascular: No chest pain, palpitations,paroxysmal nocturnal dyspnea, claudication, edema  Respiratory: No cough, sputum production,hemoptysis, DOE , significant snoring,apnea   Gastrointestinal: No heartburn,dysphagia,abdominal pain, nausea / vomiting,rectal bleeding, melena,change in bowels Genitourinary: No dysuria,hematuria, pyuria,  incontinence, nocturia Musculoskeletal: No joint stiffness, joint swelling, weakness,pain Dermatologic: No rash, pruritus, change in appearance of skin Neurologic: No dizziness,headache,syncope, seizures, numbness Psychiatric: No significant anxiety presently , depression, insomnia, anorexia Endocrine: No change in hair/skin/ nails, excessive thirst, excessive hunger, excessive urination  Hematologic/lymphatic: No significant bruising, lymphadenopathy,abnormal bleeding Allergy/immunology: No itchy/ watery eyes, significant sneezing, urticaria, angioedema  Physical exam:  Pertinent or positive findings: He has a mustache and beard. Arcus senilis is present. He has an upper partial. Dialysis catheter is present at the base of left neck Rhythm is only slightly irregular Abdomen is protuberant. He has small hypopigmented excoriations which are bland and well-healed over the trunk and abdomen. Pedal pulses are decreased. There is effusion of the right knee and tenderness to palpation.  General  appearance:Adequately nourished; no acute distress , increased work of breathing is present.   Lymphatic: No lymphadenopathy about the head, neck, axilla . Eyes: No conjunctival inflammation or lid edema is present. There is no scleral icterus. Ears:  External ear exam shows no significant lesions or deformities.   Nose:  External nasal examination shows no deformity or inflammation. Nasal mucosa are pink and moist without lesions ,exudates Oral exam: lips and gums are healthy appearing.There is no oropharyngeal erythema or exudate . Neck:  No thyromegaly, masses, tenderness noted.    Heart:  No murmur, click, rub .  Lungs:Chest clear to auscultation without wheezes, rhonchi,rales , rubs. Abdomen:Bowel sounds are normal. Abdomen is soft and nontender with no organomegaly, hernias,masses. GU: deferred  Extremities:  No cyanosis, clubbing,edema  Neurologic exam : Strength equal  in upper extremities Balance,Rhomberg,finger to nose testing could not be completed due to clinical state Skin: Warm & dry w/o tenting. No significant lesions or rash.  See clinical summary under each active problem in the Problem List with associated updated therapeutic plan

## 2016-01-28 NOTE — Assessment & Plan Note (Signed)
Some exacerbation postoperatively 01/23/14 Check CBC 12/18 as he is on warfarin

## 2016-01-28 NOTE — Assessment & Plan Note (Signed)
01/28/16 PT/INR 3.0. Warfarin decreased to 1 mg 12/16.  3 mg thereafter daily with PT/INR 12/18. Monitor for any GI blood loss. Continue ranitidine as maintenance

## 2016-01-28 NOTE — Assessment & Plan Note (Signed)
PT/OT at SNF °

## 2016-01-28 NOTE — Patient Instructions (Signed)
See Current Assessment & Plan in Problem List under specific Diagnosis 

## 2016-01-29 ENCOUNTER — Encounter (HOSPITAL_COMMUNITY): Payer: Self-pay | Admitting: *Deleted

## 2016-01-29 ENCOUNTER — Emergency Department (HOSPITAL_COMMUNITY)
Admission: EM | Admit: 2016-01-29 | Discharge: 2016-01-30 | Disposition: A | Discharge status: Home / Self Care | Payer: Medicare Other | Attending: Emergency Medicine | Admitting: Emergency Medicine

## 2016-01-29 ENCOUNTER — Emergency Department (HOSPITAL_COMMUNITY): Payer: Medicare Other

## 2016-01-29 DIAGNOSIS — E11319 Type 2 diabetes mellitus with unspecified diabetic retinopathy without macular edema: Secondary | ICD-10-CM | POA: Diagnosis not present

## 2016-01-29 DIAGNOSIS — Z7901 Long term (current) use of anticoagulants: Secondary | ICD-10-CM | POA: Insufficient documentation

## 2016-01-29 DIAGNOSIS — I132 Hypertensive heart and chronic kidney disease with heart failure and with stage 5 chronic kidney disease, or end stage renal disease: Secondary | ICD-10-CM | POA: Diagnosis not present

## 2016-01-29 DIAGNOSIS — E039 Hypothyroidism, unspecified: Secondary | ICD-10-CM | POA: Diagnosis not present

## 2016-01-29 DIAGNOSIS — I2510 Atherosclerotic heart disease of native coronary artery without angina pectoris: Secondary | ICD-10-CM | POA: Diagnosis not present

## 2016-01-29 DIAGNOSIS — Z8673 Personal history of transient ischemic attack (TIA), and cerebral infarction without residual deficits: Secondary | ICD-10-CM | POA: Insufficient documentation

## 2016-01-29 DIAGNOSIS — T8249XA Other complication of vascular dialysis catheter, initial encounter: Secondary | ICD-10-CM | POA: Insufficient documentation

## 2016-01-29 DIAGNOSIS — E1140 Type 2 diabetes mellitus with diabetic neuropathy, unspecified: Secondary | ICD-10-CM | POA: Insufficient documentation

## 2016-01-29 DIAGNOSIS — Z96641 Presence of right artificial hip joint: Secondary | ICD-10-CM | POA: Insufficient documentation

## 2016-01-29 DIAGNOSIS — N186 End stage renal disease: Secondary | ICD-10-CM | POA: Insufficient documentation

## 2016-01-29 DIAGNOSIS — I509 Heart failure, unspecified: Secondary | ICD-10-CM | POA: Insufficient documentation

## 2016-01-29 DIAGNOSIS — T85698A Other mechanical complication of other specified internal prosthetic devices, implants and grafts, initial encounter: Secondary | ICD-10-CM | POA: Diagnosis not present

## 2016-01-29 DIAGNOSIS — Z992 Dependence on renal dialysis: Secondary | ICD-10-CM | POA: Insufficient documentation

## 2016-01-29 DIAGNOSIS — Y828 Other medical devices associated with adverse incidents: Secondary | ICD-10-CM | POA: Insufficient documentation

## 2016-01-29 DIAGNOSIS — Z96651 Presence of right artificial knee joint: Secondary | ICD-10-CM | POA: Insufficient documentation

## 2016-01-29 DIAGNOSIS — T82594A Other mechanical complication of infusion catheter, initial encounter: Secondary | ICD-10-CM

## 2016-01-29 DIAGNOSIS — E1122 Type 2 diabetes mellitus with diabetic chronic kidney disease: Secondary | ICD-10-CM | POA: Insufficient documentation

## 2016-01-29 DIAGNOSIS — Z452 Encounter for adjustment and management of vascular access device: Secondary | ICD-10-CM | POA: Diagnosis not present

## 2016-01-29 LAB — I-STAT CHEM 8, ED
BUN: 63 mg/dL — AB (ref 6–20)
CALCIUM ION: 1.14 mmol/L — AB (ref 1.15–1.40)
CHLORIDE: 91 mmol/L — AB (ref 101–111)
CREATININE: 10.4 mg/dL — AB (ref 0.61–1.24)
GLUCOSE: 151 mg/dL — AB (ref 65–99)
HCT: 28 % — ABNORMAL LOW (ref 39.0–52.0)
Hemoglobin: 9.5 g/dL — ABNORMAL LOW (ref 13.0–17.0)
Potassium: 4.6 mmol/L (ref 3.5–5.1)
SODIUM: 129 mmol/L — AB (ref 135–145)
TCO2: 29 mmol/L (ref 0–100)

## 2016-01-29 LAB — PROTIME-INR
INR: 2.48
PROTHROMBIN TIME: 27.3 s — AB (ref 11.4–15.2)

## 2016-01-29 NOTE — Discharge Instructions (Signed)
Central line removed. Follow-up chest x-ray negative. Patient's INR is at 2.5. Stable for return back to rehabilitation.

## 2016-01-29 NOTE — ED Notes (Addendum)
Spoke with RN caring for pt at [facility]; per RN "there is no need for him to have a central line, he takes all of his med PO. I don't know why he was admitted with a central line." When asked about the their provider's reccomendation, RN states, "they want it removed." Dr. Rogene Houston made aware.

## 2016-01-29 NOTE — ED Notes (Signed)
Gave report to pts brother Veto Macqueen per pt/family request.

## 2016-01-29 NOTE — ED Triage Notes (Addendum)
Pt here via [facility] from [facility] for central line removal.  Pt there for rehab after having surgery to R knee on Tues.  The facility sent pt here to have his central line removed because "he does not need it anymore".  Pt is MWD dialysis pt, with last dialysis on Fri.  VS 126/84, hr 74, 97%RA, rr 20, cbg 200.

## 2016-01-29 NOTE — ED Notes (Signed)
XR at bedside

## 2016-01-29 NOTE — ED Provider Notes (Signed)
[facility] DEPT Provider Note   CSN: [id number] Arrival date & time: 01/29/16  1704     History   Chief Complaint Chief Complaint  Patient presents with  . central line removal    HPI Daniel Whitney is a 60 y.o. male.  Patient is a dialysis patient. Normally dialyzed Monday Wednesdays and Fridays. Was dialyzed this past Friday. Patient recent admission to rehabilitation facility following knee replacement surgery. Patient wasn't overtly discharged from the hospital with central line still in place in the left neck area. This was placed during surgery for access. Patient denies any fevers or complications. Patient is on blood thinners. Most recent INR was at 3. Patient states that the catheter is not being used for anything. Nursing facility sent him with orders for consideration for removal.      Past Medical History:  Diagnosis Date  . Allergic rhinitis 07/07/2014  . Anemia due to other cause 07/07/2014  . Arthritis    "all over"  . Atrial fibrillation ([place]) 12/30/2012  . CHF (congestive heart failure) ([place]) 12/30/2012  . Chronic lower back pain   . Diabetes mellitus with neuropathy ([place])   . Diabetic retinopathy ([place]) 12/30/2012   no medications  . Dysrhythmia    Afib  . ESRD (end stage renal disease) on dialysis ([place]) started in 2013   MWF; [facility]; [employer]  . GERD (gastroesophageal reflux disease)   . Glaucoma   . History of gout    "right big toe"  . Hyperlipidemia   . Hypertension   . Hypothyroid    01/17/16- no longer on meduication  . Morbid obesity ([place]) 12/30/2012  . Myocardial infarction    "I've had ~ 3; last one was in ~ 10/2013" (01/30/2014)  . OSA (obstructive sleep apnea) 12/30/2012   "lost weight; no longer needed CPAP; retested said I needed it; didn't followup cause I was feeling fine" (01/30/2014)  . Pulmonary hypertension 12/30/2012  . Refusal of blood transfusions as patient is Jehovah's Witness   . Stroke [facility]) ~ 2005; ~ 2005   "they were mild; I didn't even notice I'd had them"; denies residual on 01/30/2014    Patient Active Problem List   Diagnosis Date Noted  . Postoperative anemia due to acute blood loss 01/27/2016  . S/P knee replacement 01/24/2016  . Low back pain 02/09/2015  . Bilateral knee pain 01/27/2015  . Atypical atrial flutter ([place]) 01/24/2015  . Acute on chronic combined systolic and diastolic heart failure ([place]) 01/05/2015  . Hyperkalemia 11/08/2014  . End stage renal disease on dialysis ([place]) 11/08/2014  . Hypocalcemia 11/08/2014  . Recurrent falls 10/13/2014  . Venous stasis ulcer ([place]) 10/13/2014  . Hypoxia   . Leukocytosis   . Respiratory failure ([place])   . ARDS (adult respiratory distress syndrome) ([place])   . Encounter for central line placement   . Asystole ([place]) 08/13/2014  . Cardiopulmonary arrest ([place])   . Compression fracture of L3 lumbar vertebra ([place]) 08/04/2014  . Anemia of chronic renal failure, unspecified stage 07/07/2014  . Allergic rhinitis 07/07/2014  . Right rotator cuff tear 04/27/2014  . SOB (shortness of breath) 01/29/2014  . Acute gouty arthritis 11/11/2013  . Hearing loss in right ear 11/11/2013  . Chronic pain syndrome 11/11/2013  . Right shoulder pain 11/11/2013  . Facial rash 11/01/2013  . Right otitis externa 10/27/2013  . Pulmonary HTN- PA 58 mmHg 04/16/13 09/17/2013  . Right hip pain 08/04/2013  . DOE (dyspnea on exertion) 06/18/2013  . Allergic  rhinitis, cause unspecified 06/18/2013  . Carpal tunnel syndrome 06/06/2013  . Leg pain, bilateral 06/06/2013  . Primary localized osteoarthrosis, lower leg 06/04/2013  . Encounter for therapeutic drug monitoring 03/10/2013  . Preventative health care 12/30/2012  . Atrial fibrillation- failed Amiodarone 12/30/2012  . CHF (congestive heart failure) ([place]) 12/30/2012  . Diabetic retinopathy ([place]) 12/30/2012  . Morbid obesity ([place]) 12/30/2012  . OSA (obstructive sleep apnea)- on C-pap 12/30/2012  . Pulmonary  hypertension 12/30/2012  . Bilateral hearing loss 12/30/2012  . Long term (current) use of anticoagulants 12/30/2012  . Arthritis   . Glaucoma   . Diabetes mellitus with neuropathy ([place])   . Hypertension   . ESRD (end stage renal disease) on dialysis ([place])   . Stroke ([place])   . Hyperlipidemia     Past Surgical History:  Procedure Laterality Date  . AV FISTULA PLACEMENT Left ~ 10/2013   "forearm"  . CARDIAC CATHETERIZATION N/A 01/07/2015   Procedure: Left Heart Cath and Coronary Angiography;  Surgeon: Troy Sine, MD;  Location: [facility];  Service: Cardiovascular;  Laterality: N/A;  . CLOSED REDUCTION HIP DISLOCATION Right 1970's  . JOINT REPLACEMENT    . TOTAL HIP ARTHROPLASTY Right 1980  . TOTAL KNEE ARTHROPLASTY Right 01/24/2016   Procedure: TOTAL KNEE ARTHROPLASTY;  Surgeon: Dorna Leitz, MD;  Location: [facility];  Service: Orthopedics;  Laterality: Right;       Home Medications    Prior to Admission medications   Medication Sig Start Date End Date Taking? Authorizing Provider  acetaminophen (TYLENOL) 650 MG CR tablet Take 650 mg by mouth every 8 (eight) hours as needed for pain.   Yes Historical Provider, MD  allopurinol (ZYLOPRIM) 100 MG tablet Take 1 tablet (100 mg total) by mouth 2 (two) times daily. 01/08/15  Yes Costin Karlyne Greenspan, MD  amitriptyline (ELAVIL) 50 MG tablet Take 50 mg by mouth at bedtime.   Yes Historical Provider, MD  carvedilol (COREG) 25 MG tablet Take 25 mg by mouth See admin instructions. Take 1 tablet (25 mg) by mouth two times daily on Monday, Wednesday, Friday and Saturday for hypertension and take 1 tablet (25 mg) on Tuesday, Thursday, Sunday mornings   Yes Historical Provider, MD  dextromethorphan-guaiFENesin (MUCINEX DM) 30-600 MG 12hr tablet Take 1 tablet by mouth 2 (two) times daily.   Yes Historical Provider, MD  Multiple Vitamin (MULTIVITAMIN WITH MINERALS) TABS tablet Take 1 tablet by mouth daily.   Yes Historical Provider, MD    oxyCODONE-acetaminophen (PERCOCET/ROXICET) 5-325 MG tablet Take 1 tablet by mouth every 8 (eight) hours as needed for severe pain. 01/27/16  Yes Gary Fleet, PA-C  ranitidine (ZANTAC) 150 MG tablet Take 150 mg by mouth 2 (two) times daily as needed for heartburn. Reported on 02/03/2015   Yes Historical Provider, MD  sevelamer carbonate (RENVELA) 800 MG tablet Take 1,600 mg by mouth 3 (three) times daily with meals.    Yes Historical Provider, MD  simvastatin (ZOCOR) 20 MG tablet TAKE ONE TABLET BY MOUTH ONCE DAILY Patient taking differently: TAKE ONE TABLET BY MOUTH ONCE DAILY AT 6PM 08/06/14  Yes Biagio Borg, MD  sodium chloride (OCEAN) 0.65 % SOLN nasal spray Place 1 spray into both nostrils as needed for congestion. 01/31/14  Yes Shanker Kristeen Mans, MD  tiZANidine (ZANAFLEX) 2 MG tablet Take 1 tablet (2 mg total) by mouth every 8 (eight) hours as needed for muscle spasms. 01/25/16  Yes Gary Fleet, PA-C  warfarin (COUMADIN) 3 MG tablet  Please take as directed by anticoagulation clinic. Patient taking differently: Take 3 mg by mouth every evening.  10/11/14  Yes Biagio Borg, MD  allopurinol (ZYLOPRIM) 100 MG tablet TAKE ONE TABLET BY MOUTH TWICE DAILY Patient not taking: Reported on 01/29/2016 01/19/15   Lyndal Pulley, DO  amitriptyline (ELAVIL) 75 MG tablet Take 1 tablet (75 mg total) by mouth at bedtime. Patient not taking: Reported on 01/29/2016 02/02/15   Dene Gentry, MD  Blood Glucose Monitoring Suppl (ONE TOUCH ULTRA 2) W/DEVICE KIT Use to check blood sugars twice a day Dx E11.9 10/29/14   Biagio Borg, MD  glucose blood (ONE TOUCH ULTRA TEST) test strip 1 each by Other route 2 (two) times daily. Use to check blood sugars twice a day Dx E11.9 01/08/15   Caren Griffins, MD  Lancets [facility] ULTRASOFT) lancets 1 each by Other route 2 (two) times daily. Use to help check blood sugars twice a day Dx E11.9 10/29/14   Biagio Borg, MD    Family History Family History  Problem  Relation Age of Onset  . Arthritis Other   . Heart disease Other   . Hyperlipidemia Other   . Hypertension Other   . Kidney disease Other   . Diabetes Other   . Stroke Other   . Diabetes Mother   . Cancer Father     bone cancer  . Heart disease Father   . Diabetes Sister   . Diabetes Brother   . Stroke Brother     Social History Social History  Substance Use Topics  . Smoking status: Never Smoker  . Smokeless tobacco: Never Used  . Alcohol use 4.2 - 4.8 oz/week    3 - 4 Standard drinks or equivalent, 3 Shots of liquor, 1 Cans of beer per week     Comment: 01/30/2014 "none in the last 3 weeks; primarily drink w/my friends on the weekends"     Allergies   No known allergies   Review of Systems Review of Systems  Constitutional: Negative for fever.  HENT: Negative for congestion.   Eyes: Negative for redness.  Respiratory: Negative for shortness of breath.   Cardiovascular: Negative for chest pain.  Gastrointestinal: Negative for abdominal pain.  Musculoskeletal: Negative for neck pain.  Skin: Negative for rash.  Allergic/Immunologic: Positive for immunocompromised state.  Neurological: Negative for headaches.  Hematological: Bruises/bleeds easily.  Psychiatric/Behavioral: Negative for confusion.     Physical Exam Updated Vital Signs BP 116/80   Pulse 96   Temp 99.6 F (37.6 C) (Oral)   Resp 18   Ht '5\' 9"'  (1.753 m)   Wt 109.3 kg   SpO2 97%   BMI 35.59 kg/m   Physical Exam  Constitutional: He is oriented to person, place, and time. He appears well-developed and well-nourished. No distress.  HENT:  Head: Normocephalic and atraumatic.  Central line and left internal jugular vein.  Eyes: EOM are normal. Pupils are equal, round, and reactive to light.  Neck: Neck supple.  As stated above.  Cardiovascular: Normal rate and regular rhythm.   Pulmonary/Chest: Effort normal and breath sounds normal. No respiratory distress.  Abdominal: Soft.    Musculoskeletal:  AV fistula left arm. Good thrill.  Neurological: He is alert and oriented to person, place, and time. No cranial nerve deficit or sensory deficit. He exhibits normal muscle tone. Coordination normal.  Skin: No erythema.  Nursing note and vitals reviewed.    ED Treatments / Results  Labs (  all labs ordered are listed, but only abnormal results are displayed) Labs Reviewed  PROTIME-INR - Abnormal; Notable for the following:       Result Value   Prothrombin Time 27.3 (*)    All other components within normal limits  I-STAT CHEM 8, ED - Abnormal; Notable for the following:    Sodium 129 (*)    Chloride 91 (*)    BUN 63 (*)    Creatinine, Ser 10.40 (*)    Glucose, Bld 151 (*)    Calcium, Ion 1.14 (*)    Hemoglobin 9.5 (*)    HCT 28.0 (*)    All other components within normal limits    EKG  EKG Interpretation  Date/Time:  Sunday January 29 2016 18:24:20 EST Ventricular Rate:  93 PR Interval:    QRS Duration: 90 QT Interval:  359 QTC Calculation: 447 R Axis:   -59 Text Interpretation:  Atrial fibrillation Multiple ventricular premature complexes LAD, consider left anterior fascicular block Borderline low voltage, extremity leads Abnormal R-wave progression, late transition Confirmed by Davy Westmoreland  MD, Demorris Choyce [phone]) on 01/29/2016 6:34:05 PM       Radiology Dg Chest Port 1 View  Result Date: 01/29/2016 CLINICAL DATA:  Central line removal. History of hypertension, atrial fibrillation, CHF. EXAM: PORTABLE CHEST 1 VIEW COMPARISON:  Chest radiograph January 24, 2016 FINDINGS: Interval removal of LEFT central venous catheter. No retained fragments. No pneumothorax. The cardiac silhouette is mildly enlarged and unchanged. Mild pulmonary vascular congestion. Strandy densities LEFT lung base with mildly elevated LEFT hemidiaphragm. No pleural effusion. Large body habitus. Osseous structures are nonsuspicious. IMPRESSION: Interval removal of LEFT central venous  catheter. Stable cardiomegaly and pulmonary vascular congestion. LEFT lung base atelectasis. Electronically Signed   By: Elon Alas M.D.   On: 01/29/2016 22:59    Procedures Procedures (including critical care time)  Medications Ordered in ED Medications - No data to display   Initial Impression / Assessment and Plan / ED Course  I have reviewed the triage vital signs and the nursing notes.  Pertinent labs & imaging results that were available during my care of the patient were reviewed by me and considered in my medical decision making (see chart for details).  Clinical Course     Patient inadvertently discharged from hospital with central line still in place in the left neck area. Sent in from rehabilitation to have this removed. Patient's INR is 2.5. Catheter removed. Actually was noted to the catheter was clotted and not working properly. Probably has not been flushed. Catheter was placed for central access during his knee replacement surgery. No longer required. Patient tolerated removal fine. Follow-up chest x-ray showed no complicating factors.  Final Clinical Impressions(s) / ED Diagnoses   Final diagnoses:  None    New Prescriptions New Prescriptions   No medications on file     Fredia Sorrow, MD 01/29/16 2316

## 2016-01-30 ENCOUNTER — Other Ambulatory Visit: Payer: Self-pay | Admitting: *Deleted

## 2016-01-30 DIAGNOSIS — N2581 Secondary hyperparathyroidism of renal origin: Secondary | ICD-10-CM | POA: Diagnosis not present

## 2016-01-30 DIAGNOSIS — D689 Coagulation defect, unspecified: Secondary | ICD-10-CM | POA: Diagnosis not present

## 2016-01-30 DIAGNOSIS — D631 Anemia in chronic kidney disease: Secondary | ICD-10-CM | POA: Diagnosis not present

## 2016-01-30 DIAGNOSIS — Z992 Dependence on renal dialysis: Secondary | ICD-10-CM | POA: Diagnosis not present

## 2016-01-30 DIAGNOSIS — E1129 Type 2 diabetes mellitus with other diabetic kidney complication: Secondary | ICD-10-CM | POA: Diagnosis not present

## 2016-01-30 DIAGNOSIS — N186 End stage renal disease: Secondary | ICD-10-CM | POA: Diagnosis not present

## 2016-01-30 LAB — POCT INR: INR: 2.1 — AB (ref 0.9–1.1)

## 2016-01-30 LAB — PROTIME-INR: PROTIME: 23.5 s — AB (ref 10.0–13.8)

## 2016-01-30 MED ORDER — OXYCODONE-ACETAMINOPHEN 5-325 MG PO TABS: 1.0000 | ORAL_TABLET | Freq: Three times a day (TID) | ORAL | 0 refills | Status: DC | PRN

## 2016-01-30 NOTE — Telephone Encounter (Signed)
[facility] [phone] Fax: [phone]  

## 2016-01-31 ENCOUNTER — Other Ambulatory Visit: Payer: Self-pay | Admitting: *Deleted

## 2016-01-31 NOTE — Patient Outreach (Signed)
[facility]) Care Management  01/31/2016  Nollie Terlizzi [birth year] [id number]   Met with Wynonia Lawman at facility. She reports that patient doing well, will discharge home with brother.  No anticipated [facility] care management needs.  Requested LCSW to refer to [facility] if any [facility] care management needs arise. Plan to sign off, will be available if needs arise.  Royetta Crochet. Laymond Purser, RN, BSN, [place] Acute Care Coordinator [phone]

## 2016-02-01 DIAGNOSIS — N186 End stage renal disease: Secondary | ICD-10-CM | POA: Diagnosis not present

## 2016-02-01 DIAGNOSIS — D631 Anemia in chronic kidney disease: Secondary | ICD-10-CM | POA: Diagnosis not present

## 2016-02-01 DIAGNOSIS — E1129 Type 2 diabetes mellitus with other diabetic kidney complication: Secondary | ICD-10-CM | POA: Diagnosis not present

## 2016-02-01 DIAGNOSIS — N2581 Secondary hyperparathyroidism of renal origin: Secondary | ICD-10-CM | POA: Diagnosis not present

## 2016-02-01 DIAGNOSIS — D689 Coagulation defect, unspecified: Secondary | ICD-10-CM | POA: Diagnosis not present

## 2016-02-01 DIAGNOSIS — Z992 Dependence on renal dialysis: Secondary | ICD-10-CM | POA: Diagnosis not present

## 2016-02-01 LAB — PROTIME-INR: PROTIME: 42.3 s — AB (ref 10.0–13.8)

## 2016-02-02 LAB — PROTIME-INR: Protime: 18.9 seconds — AB (ref 10.0–13.8)

## 2016-02-02 LAB — POCT INR: INR: 1.6 — AB (ref 0.9–1.1)

## 2016-02-03 DIAGNOSIS — Z992 Dependence on renal dialysis: Secondary | ICD-10-CM | POA: Diagnosis not present

## 2016-02-03 DIAGNOSIS — D689 Coagulation defect, unspecified: Secondary | ICD-10-CM | POA: Diagnosis not present

## 2016-02-03 DIAGNOSIS — D631 Anemia in chronic kidney disease: Secondary | ICD-10-CM | POA: Diagnosis not present

## 2016-02-03 DIAGNOSIS — N2581 Secondary hyperparathyroidism of renal origin: Secondary | ICD-10-CM | POA: Diagnosis not present

## 2016-02-03 DIAGNOSIS — N186 End stage renal disease: Secondary | ICD-10-CM | POA: Diagnosis not present

## 2016-02-03 DIAGNOSIS — E1129 Type 2 diabetes mellitus with other diabetic kidney complication: Secondary | ICD-10-CM | POA: Diagnosis not present

## 2016-02-05 DIAGNOSIS — N2581 Secondary hyperparathyroidism of renal origin: Secondary | ICD-10-CM | POA: Diagnosis not present

## 2016-02-05 DIAGNOSIS — E1129 Type 2 diabetes mellitus with other diabetic kidney complication: Secondary | ICD-10-CM | POA: Diagnosis not present

## 2016-02-05 DIAGNOSIS — D689 Coagulation defect, unspecified: Secondary | ICD-10-CM | POA: Diagnosis not present

## 2016-02-05 DIAGNOSIS — Z992 Dependence on renal dialysis: Secondary | ICD-10-CM | POA: Diagnosis not present

## 2016-02-05 DIAGNOSIS — D631 Anemia in chronic kidney disease: Secondary | ICD-10-CM | POA: Diagnosis not present

## 2016-02-05 DIAGNOSIS — N186 End stage renal disease: Secondary | ICD-10-CM | POA: Diagnosis not present

## 2016-02-07 DIAGNOSIS — M1711 Unilateral primary osteoarthritis, right knee: Secondary | ICD-10-CM | POA: Diagnosis not present

## 2016-02-08 DIAGNOSIS — N186 End stage renal disease: Secondary | ICD-10-CM | POA: Diagnosis not present

## 2016-02-08 DIAGNOSIS — D631 Anemia in chronic kidney disease: Secondary | ICD-10-CM | POA: Diagnosis not present

## 2016-02-08 DIAGNOSIS — D689 Coagulation defect, unspecified: Secondary | ICD-10-CM | POA: Diagnosis not present

## 2016-02-08 DIAGNOSIS — Z992 Dependence on renal dialysis: Secondary | ICD-10-CM | POA: Diagnosis not present

## 2016-02-08 DIAGNOSIS — E1129 Type 2 diabetes mellitus with other diabetic kidney complication: Secondary | ICD-10-CM | POA: Diagnosis not present

## 2016-02-08 DIAGNOSIS — N2581 Secondary hyperparathyroidism of renal origin: Secondary | ICD-10-CM | POA: Diagnosis not present

## 2016-02-08 LAB — PROTIME-INR: INR: 2.4 — AB (ref <=1.1)

## 2016-02-09 ENCOUNTER — Ambulatory Visit (INDEPENDENT_AMBULATORY_CARE_PROVIDER_SITE_OTHER): Payer: Medicare Other | Admitting: Pharmacist

## 2016-02-09 DIAGNOSIS — I480 Paroxysmal atrial fibrillation: Secondary | ICD-10-CM

## 2016-02-09 LAB — PROTIME-INR

## 2016-02-10 DIAGNOSIS — N186 End stage renal disease: Secondary | ICD-10-CM | POA: Diagnosis not present

## 2016-02-10 DIAGNOSIS — D631 Anemia in chronic kidney disease: Secondary | ICD-10-CM | POA: Diagnosis not present

## 2016-02-10 DIAGNOSIS — D689 Coagulation defect, unspecified: Secondary | ICD-10-CM | POA: Diagnosis not present

## 2016-02-10 DIAGNOSIS — N2581 Secondary hyperparathyroidism of renal origin: Secondary | ICD-10-CM | POA: Diagnosis not present

## 2016-02-10 DIAGNOSIS — Z992 Dependence on renal dialysis: Secondary | ICD-10-CM | POA: Diagnosis not present

## 2016-02-10 DIAGNOSIS — E1129 Type 2 diabetes mellitus with other diabetic kidney complication: Secondary | ICD-10-CM | POA: Diagnosis not present

## 2016-02-12 DIAGNOSIS — D689 Coagulation defect, unspecified: Secondary | ICD-10-CM | POA: Diagnosis not present

## 2016-02-12 DIAGNOSIS — E1129 Type 2 diabetes mellitus with other diabetic kidney complication: Secondary | ICD-10-CM | POA: Diagnosis not present

## 2016-02-12 DIAGNOSIS — N2581 Secondary hyperparathyroidism of renal origin: Secondary | ICD-10-CM | POA: Diagnosis not present

## 2016-02-12 DIAGNOSIS — D631 Anemia in chronic kidney disease: Secondary | ICD-10-CM | POA: Diagnosis not present

## 2016-02-12 DIAGNOSIS — N186 End stage renal disease: Secondary | ICD-10-CM | POA: Diagnosis not present

## 2016-02-12 DIAGNOSIS — I120 Hypertensive chronic kidney disease with stage 5 chronic kidney disease or end stage renal disease: Secondary | ICD-10-CM | POA: Diagnosis not present

## 2016-02-12 DIAGNOSIS — Z992 Dependence on renal dialysis: Secondary | ICD-10-CM | POA: Diagnosis not present

## 2016-02-15 ENCOUNTER — Encounter: Payer: Self-pay | Admitting: Nurse Practitioner

## 2016-02-15 ENCOUNTER — Non-Acute Institutional Stay (SKILLED_NURSING_FACILITY): Payer: Medicare Other | Admitting: Nurse Practitioner

## 2016-02-15 DIAGNOSIS — Z96651 Presence of right artificial knee joint: Secondary | ICD-10-CM

## 2016-02-15 DIAGNOSIS — N186 End stage renal disease: Secondary | ICD-10-CM | POA: Diagnosis not present

## 2016-02-15 DIAGNOSIS — Z992 Dependence on renal dialysis: Secondary | ICD-10-CM | POA: Diagnosis not present

## 2016-02-15 DIAGNOSIS — T148XXA Other injury of unspecified body region, initial encounter: Secondary | ICD-10-CM

## 2016-02-15 DIAGNOSIS — N2581 Secondary hyperparathyroidism of renal origin: Secondary | ICD-10-CM | POA: Diagnosis not present

## 2016-02-15 DIAGNOSIS — R509 Fever, unspecified: Secondary | ICD-10-CM | POA: Diagnosis not present

## 2016-02-15 DIAGNOSIS — D631 Anemia in chronic kidney disease: Secondary | ICD-10-CM | POA: Diagnosis not present

## 2016-02-15 DIAGNOSIS — E1129 Type 2 diabetes mellitus with other diabetic kidney complication: Secondary | ICD-10-CM | POA: Diagnosis not present

## 2016-02-15 NOTE — Progress Notes (Signed)
  Nursing Home Location:  [facility]  Place of Service: SNF (31)  PCP: KOIRALA,DIBAS, MD  Allergies  Allergen Reactions  . No Known Allergies     Chief Complaint  Patient presents with  . Acute Visit    Left calf pain    HPI:  Patient is a 61 y.o. male seen today at [facility] due to left posterior calf pain. Pt with hx of a fib on coumadin, CHF, HTN, OSA, DM, ESRD and OA. [facility].  Pt was concerned over area on calf that was painful. Concerns of bedsore and or cellulitis. Pt had been complaining of skin itching but this has improved with lotion that staff is applying routinely at this time.   Prior to visit pt was in therapy and had a fall where he hit right knee. Having increased pain at this time. No increase in swelling noted. Received pain medication a few mins prior to visit.   Review of Systems:  Review of Systems  Constitutional: Negative for activity change and appetite change.  Skin: Negative for color change.       Tenderness noted to skin on left leg Right knee tender to touch    Past Medical History:  Diagnosis Date  . Allergic rhinitis 07/07/2014  . Anemia due to other cause 07/07/2014  . Arthritis    "all over"  . Atrial fibrillation (HCC) 12/30/2012  . CHF (congestive heart failure) (HCC) 12/30/2012  . Chronic lower back pain   . Diabetes mellitus with neuropathy (HCC)   . Diabetic retinopathy (HCC) 12/30/2012   no medications  . Dysrhythmia    Afib  . ESRD (end stage renal disease) on dialysis ([facility]) started in 2013   MWF; [facility]; [address]  . GERD (gastroesophageal reflux disease)   . Glaucoma   . History of gout    "right big toe"  . Hyperlipidemia   . Hypertension   . Hypothyroid    01/17/16- no longer on meduication  . Morbid obesity (HCC) 12/30/2012  . Myocardial infarction    "I've had ~ 3; last one was in ~ 10/2013" (01/30/2014)  . OSA (obstructive sleep apnea)  12/30/2012   "lost weight; no longer needed CPAP; retested said I needed it; didn't followup cause I was feeling fine" (01/30/2014)  . Pulmonary hypertension 12/30/2012  . Refusal of blood transfusions as patient is Jehovah's Witness   . Stroke (HCC) ~ 2005; ~ 2005   "they were mild; I didn't even notice I'd had them"; denies residual on 01/30/2014   Past Surgical History:  Procedure Laterality Date  . AV FISTULA PLACEMENT Left ~ 10/2013   "forearm"  . CARDIAC CATHETERIZATION N/A 01/07/2015   Procedure: Left Heart Cath and Coronary Angiography;  Surgeon: Thomas A Kelly, MD;  [facility]: [facility];  Service: Cardiovascular;  Laterality: N/A;  . CLOSED REDUCTION HIP DISLOCATION Right 1970's  . JOINT REPLACEMENT    . TOTAL HIP ARTHROPLASTY Right 1980  . TOTAL KNEE ARTHROPLASTY Right 01/24/2016   Procedure: TOTAL KNEE ARTHROPLASTY;  Surgeon: John Graves, MD;  Location: [facility] OR;  Service: Orthopedics;  Laterality: Right;   Social History:   reports that he has never smoked. He has never used smokeless tobacco. He reports that he drinks about 4.2 - 4.8 oz of alcohol per week . He reports that he does not use drugs.  Family History  Problem Relation Age of Onset  . Arthritis Other   .   Heart disease Other   . Hyperlipidemia Other   . Hypertension Other   . Kidney disease Other   . Diabetes Other   . Stroke Other   . Diabetes Mother   . Cancer Father     bone cancer  . Heart disease Father   . Diabetes Sister   . Diabetes Brother   . Stroke Brother     Medications: Patient's Medications  New Prescriptions   No medications on file  Previous Medications   ACETAMINOPHEN (TYLENOL) 650 MG CR TABLET    Take 650 mg by mouth every 8 (eight) hours as needed for pain.   ALLOPURINOL (ZYLOPRIM) 100 MG TABLET    TAKE ONE TABLET BY MOUTH TWICE DAILY   AMINO ACIDS-PROTEIN HYDROLYS (FEEDING SUPPLEMENT, PRO-STAT SUGAR FREE 64,) LIQD    Take 30 mLs by mouth 2 (two) times daily.    AMITRIPTYLINE (ELAVIL) 50 MG TABLET    Take 50 mg by mouth at bedtime.   CALCIUM ACETATE (PHOSLO) 667 MG CAPSULE    Take 667 mg by mouth 2 (two) times daily.   CARVEDILOL (COREG) 25 MG TABLET    Take 1 tablet by mouth two times daily on Monday, Wednesday, and Saturday. Give 1 tablet by mouth every Tuesday, Thursday, and Sunday morning for hypertension.   DEXTROMETHORPHAN-GUAIFENESIN (MUCINEX DM) 30-600 MG 12HR TABLET    Take 1 tablet by mouth 2 (two) times daily.   GLUCOSE BLOOD (ONE TOUCH ULTRA TEST) TEST STRIP    1 each by Other route 2 (two) times daily. Use to check blood sugars twice a day Dx E11.9   LANCETS (ONETOUCH ULTRASOFT) LANCETS    1 each by Other route 2 (two) times daily. Use to help check blood sugars twice a day Dx E11.9   MULTIPLE VITAMIN (MULTIVITAMIN WITH MINERALS) TABS TABLET    Take 1 tablet by mouth daily.   OXYCODONE-ACETAMINOPHEN (PERCOCET/ROXICET) 5-325 MG TABLET    Take 1 tablet by mouth every 8 (eight) hours as needed for severe pain.   PRAMOXINE-MENTHOL-DIMETHICONE (GOLD BOND MEDICATED ANTI ITCH EX)    Apply to skin twice a day for itching.   RANITIDINE (ZANTAC) 150 MG TABLET    Take 150 mg by mouth 2 (two) times daily as needed for heartburn. Reported on 02/03/2015   SIMVASTATIN (ZOCOR) 20 MG TABLET    TAKE ONE TABLET BY MOUTH ONCE DAILY   SODIUM CHLORIDE (OCEAN) 0.65 % SOLN NASAL SPRAY    Place 1 spray into both nostrils as needed for congestion.   TIZANIDINE (ZANAFLEX) 2 MG TABLET    Take 1 tablet (2 mg total) by mouth every 8 (eight) hours as needed for muscle spasms.   WARFARIN (COUMADIN) 1 MG TABLET    Give 1/2 tablet along with 3 mg tablet to equal 3.5 mg daily.   WARFARIN (COUMADIN) 3 MG TABLET    Take 3 mg by mouth daily.  Modified Medications   No medications on file  Discontinued Medications   ALLOPURINOL (ZYLOPRIM) 100 MG TABLET    Take 1 tablet (100 mg total) by mouth 2 (two) times daily.   AMITRIPTYLINE (ELAVIL) 75 MG TABLET    Take 1 tablet (75 mg  total) by mouth at bedtime.   BLOOD GLUCOSE MONITORING SUPPL (ONE TOUCH ULTRA 2) W/DEVICE KIT    Use to check blood sugars twice a day Dx E11.9   CARVEDILOL (COREG) 25 MG TABLET    Take 25 mg by mouth See admin instructions. Take 1 tablet (  25 mg) by mouth two times daily on Monday, Wednesday, Friday and Saturday for hypertension and take 1 tablet (25 mg) on Tuesday, Thursday, Sunday mornings   SEVELAMER CARBONATE (RENVELA) 800 MG TABLET    Take 1,600 mg by mouth 3 (three) times daily with meals.    WARFARIN (COUMADIN) 3 MG TABLET    Please take as directed by anticoagulation clinic.     Physical Exam: Vitals:   02/15/16 1327  BP: 118/74  Pulse: 89  Resp: 18  Temp: 97.8 F (36.6 C)  SpO2: 92%  Weight: 240 lb (108.9 kg)  Height: 5' 9" (1.753 m)    Physical Exam  Constitutional: He is oriented to person, place, and time. He appears well-developed and well-nourished. No distress.  HENT:  Head: Normocephalic and atraumatic.  Eyes: Pupils are equal, round, and reactive to light.  Musculoskeletal: He exhibits tenderness. He exhibits no edema.  Noted to right knee  Neurological: He is alert and oriented to person, place, and time.  Skin: Skin is warm and dry. He is not diaphoretic.  Abrasion noted from scratching noted to left calf noted. Slightly redness around area. No drainage or heat noted.   Psychiatric: He has a normal mood and affect.    Labs reviewed: Basic Metabolic Panel:  Recent Labs  01/17/16 1047  01/25/16 0347 01/27/16 1001 01/27/16 1030 01/29/16 2006  NA 134*  < > 134*  133* 129*  --  129*  K 4.8  < > 5.6*  5.6* 5.0  --  4.6  CL 97*  --  95*  94* 89*  --  91*  CO2 29  --  21*  21* 26  --   --   GLUCOSE 134*  < > 145*  145* 165*  --  151*  BUN 33*  --  49*  50* 47*  --  63*  CREATININE 7.62*  --  9.53*  9.53* 10.06*  --  10.40*  CALCIUM 9.7  --  9.3  9.2 9.4  --   --   PHOS  --   --  8.2*  --  8.3*  --   < > = values in this interval not  displayed. Liver Function Tests:  Recent Labs  01/17/16 1047 01/25/16 0347 01/27/16 1030  AST 17  --   --   ALT 23  --   --   ALKPHOS 93  --   --   BILITOT 0.7  --   --   PROT 7.1  --   --   ALBUMIN 3.2* 3.2* 2.6*   No results for input(s): LIPASE, AMYLASE in the last 8760 hours. No results for input(s): AMMONIA in the last 8760 hours. CBC:  Recent Labs  01/17/16 1047  01/25/16 0347 01/26/16 0548 01/27/16 0510 01/29/16 2006  WBC 7.6  --  12.8* 14.0* 12.0*  --   NEUTROABS 4.0  --   --   --   --   --   HGB 11.6*  < > 11.2* 9.6* 8.6* 9.5*  HCT 36.2*  < > 34.7* 30.2* 26.5* 28.0*  MCV 96.5  --  96.4 96.2 95.3  --   PLT 196  --  185 163 163  --   < > = values in this interval not displayed. TSH: No results for input(s): TSH in the last 8760 hours. A1C: Lab Results  Component Value Date   HGBA1C 6.7 (H) 01/17/2016   Lipid Panel: No results for input(s): CHOL, HDL, LDLCALC, TRIG,  CHOLHDL, LDLDIRECT in the last 8760 hours.  Assessment/Plan 1. Status post right knee replacement Pt s/p fall in therapy and knee is tender but no increase in swelling or bruising noted.  Therapy to apply ice, if pain is still severe 1 hour after pain medication has been given may give additional oxycodone 5/325 mg x1 dose. Will need to be notified if pain persist    2. Abrasion Staff to monitor, no signs of infection To apply neosporin twice daily and notify if area fails to improve or worsens.    Jessica K. Eubanks, AGNP  [facility] [phone](Monday-Friday 8 am - 5 pm) [phone] (after hours) 

## 2016-02-17 DIAGNOSIS — E1129 Type 2 diabetes mellitus with other diabetic kidney complication: Secondary | ICD-10-CM | POA: Diagnosis not present

## 2016-02-17 DIAGNOSIS — N2581 Secondary hyperparathyroidism of renal origin: Secondary | ICD-10-CM | POA: Diagnosis not present

## 2016-02-17 DIAGNOSIS — D631 Anemia in chronic kidney disease: Secondary | ICD-10-CM | POA: Diagnosis not present

## 2016-02-17 DIAGNOSIS — R509 Fever, unspecified: Secondary | ICD-10-CM | POA: Diagnosis not present

## 2016-02-17 DIAGNOSIS — Z992 Dependence on renal dialysis: Secondary | ICD-10-CM | POA: Diagnosis not present

## 2016-02-17 DIAGNOSIS — N186 End stage renal disease: Secondary | ICD-10-CM | POA: Diagnosis not present

## 2016-02-20 DIAGNOSIS — R509 Fever, unspecified: Secondary | ICD-10-CM | POA: Diagnosis not present

## 2016-02-20 DIAGNOSIS — Z992 Dependence on renal dialysis: Secondary | ICD-10-CM | POA: Diagnosis not present

## 2016-02-20 DIAGNOSIS — N186 End stage renal disease: Secondary | ICD-10-CM | POA: Diagnosis not present

## 2016-02-20 DIAGNOSIS — D631 Anemia in chronic kidney disease: Secondary | ICD-10-CM | POA: Diagnosis not present

## 2016-02-20 DIAGNOSIS — E1129 Type 2 diabetes mellitus with other diabetic kidney complication: Secondary | ICD-10-CM | POA: Diagnosis not present

## 2016-02-20 DIAGNOSIS — N2581 Secondary hyperparathyroidism of renal origin: Secondary | ICD-10-CM | POA: Diagnosis not present

## 2016-02-22 DIAGNOSIS — N2581 Secondary hyperparathyroidism of renal origin: Secondary | ICD-10-CM | POA: Diagnosis not present

## 2016-02-22 DIAGNOSIS — D631 Anemia in chronic kidney disease: Secondary | ICD-10-CM | POA: Diagnosis not present

## 2016-02-22 DIAGNOSIS — R509 Fever, unspecified: Secondary | ICD-10-CM | POA: Diagnosis not present

## 2016-02-22 DIAGNOSIS — N186 End stage renal disease: Secondary | ICD-10-CM | POA: Diagnosis not present

## 2016-02-22 DIAGNOSIS — Z992 Dependence on renal dialysis: Secondary | ICD-10-CM | POA: Diagnosis not present

## 2016-02-22 DIAGNOSIS — E1129 Type 2 diabetes mellitus with other diabetic kidney complication: Secondary | ICD-10-CM | POA: Diagnosis not present

## 2016-02-24 DIAGNOSIS — E1129 Type 2 diabetes mellitus with other diabetic kidney complication: Secondary | ICD-10-CM | POA: Diagnosis not present

## 2016-02-24 DIAGNOSIS — R509 Fever, unspecified: Secondary | ICD-10-CM | POA: Diagnosis not present

## 2016-02-24 DIAGNOSIS — N2581 Secondary hyperparathyroidism of renal origin: Secondary | ICD-10-CM | POA: Diagnosis not present

## 2016-02-24 DIAGNOSIS — D631 Anemia in chronic kidney disease: Secondary | ICD-10-CM | POA: Diagnosis not present

## 2016-02-24 DIAGNOSIS — N186 End stage renal disease: Secondary | ICD-10-CM | POA: Diagnosis not present

## 2016-02-24 DIAGNOSIS — Z992 Dependence on renal dialysis: Secondary | ICD-10-CM | POA: Diagnosis not present

## 2016-02-27 DIAGNOSIS — N186 End stage renal disease: Secondary | ICD-10-CM | POA: Diagnosis not present

## 2016-02-27 DIAGNOSIS — D631 Anemia in chronic kidney disease: Secondary | ICD-10-CM | POA: Diagnosis not present

## 2016-02-27 DIAGNOSIS — E1129 Type 2 diabetes mellitus with other diabetic kidney complication: Secondary | ICD-10-CM | POA: Diagnosis not present

## 2016-02-27 DIAGNOSIS — R509 Fever, unspecified: Secondary | ICD-10-CM | POA: Diagnosis not present

## 2016-02-27 DIAGNOSIS — N2581 Secondary hyperparathyroidism of renal origin: Secondary | ICD-10-CM | POA: Diagnosis not present

## 2016-02-27 DIAGNOSIS — Z992 Dependence on renal dialysis: Secondary | ICD-10-CM | POA: Diagnosis not present

## 2016-02-28 DIAGNOSIS — M1711 Unilateral primary osteoarthritis, right knee: Secondary | ICD-10-CM | POA: Diagnosis not present

## 2016-02-28 DIAGNOSIS — M1712 Unilateral primary osteoarthritis, left knee: Secondary | ICD-10-CM | POA: Diagnosis not present

## 2016-02-28 LAB — POCT INR: INR: 2.1 — AB (ref 0.9–1.1)

## 2016-02-28 LAB — PROTIME-INR: Protime: 23.5 seconds — AB (ref 10.0–13.8)

## 2016-02-29 DIAGNOSIS — D631 Anemia in chronic kidney disease: Secondary | ICD-10-CM | POA: Diagnosis not present

## 2016-02-29 DIAGNOSIS — N2581 Secondary hyperparathyroidism of renal origin: Secondary | ICD-10-CM | POA: Diagnosis not present

## 2016-02-29 DIAGNOSIS — Z992 Dependence on renal dialysis: Secondary | ICD-10-CM | POA: Diagnosis not present

## 2016-02-29 DIAGNOSIS — E1129 Type 2 diabetes mellitus with other diabetic kidney complication: Secondary | ICD-10-CM | POA: Diagnosis not present

## 2016-02-29 DIAGNOSIS — N186 End stage renal disease: Secondary | ICD-10-CM | POA: Diagnosis not present

## 2016-02-29 DIAGNOSIS — R509 Fever, unspecified: Secondary | ICD-10-CM | POA: Diagnosis not present

## 2016-03-02 DIAGNOSIS — E1129 Type 2 diabetes mellitus with other diabetic kidney complication: Secondary | ICD-10-CM | POA: Diagnosis not present

## 2016-03-02 DIAGNOSIS — N2581 Secondary hyperparathyroidism of renal origin: Secondary | ICD-10-CM | POA: Diagnosis not present

## 2016-03-02 DIAGNOSIS — Z992 Dependence on renal dialysis: Secondary | ICD-10-CM | POA: Diagnosis not present

## 2016-03-02 DIAGNOSIS — D631 Anemia in chronic kidney disease: Secondary | ICD-10-CM | POA: Diagnosis not present

## 2016-03-02 DIAGNOSIS — R509 Fever, unspecified: Secondary | ICD-10-CM | POA: Diagnosis not present

## 2016-03-02 DIAGNOSIS — N186 End stage renal disease: Secondary | ICD-10-CM | POA: Diagnosis not present

## 2016-03-05 DIAGNOSIS — N186 End stage renal disease: Secondary | ICD-10-CM | POA: Diagnosis not present

## 2016-03-05 DIAGNOSIS — N2581 Secondary hyperparathyroidism of renal origin: Secondary | ICD-10-CM | POA: Diagnosis not present

## 2016-03-05 DIAGNOSIS — Z992 Dependence on renal dialysis: Secondary | ICD-10-CM | POA: Diagnosis not present

## 2016-03-05 DIAGNOSIS — D631 Anemia in chronic kidney disease: Secondary | ICD-10-CM | POA: Diagnosis not present

## 2016-03-05 DIAGNOSIS — R509 Fever, unspecified: Secondary | ICD-10-CM | POA: Diagnosis not present

## 2016-03-05 DIAGNOSIS — E1129 Type 2 diabetes mellitus with other diabetic kidney complication: Secondary | ICD-10-CM | POA: Diagnosis not present

## 2016-03-06 DIAGNOSIS — T82858A Stenosis of vascular prosthetic devices, implants and grafts, initial encounter: Secondary | ICD-10-CM | POA: Diagnosis not present

## 2016-03-06 DIAGNOSIS — Z992 Dependence on renal dialysis: Secondary | ICD-10-CM | POA: Diagnosis not present

## 2016-03-06 DIAGNOSIS — I871 Compression of vein: Secondary | ICD-10-CM | POA: Diagnosis not present

## 2016-03-06 DIAGNOSIS — N186 End stage renal disease: Secondary | ICD-10-CM | POA: Diagnosis not present

## 2016-03-07 DIAGNOSIS — E1129 Type 2 diabetes mellitus with other diabetic kidney complication: Secondary | ICD-10-CM | POA: Diagnosis not present

## 2016-03-07 DIAGNOSIS — Z992 Dependence on renal dialysis: Secondary | ICD-10-CM | POA: Diagnosis not present

## 2016-03-07 DIAGNOSIS — R509 Fever, unspecified: Secondary | ICD-10-CM | POA: Diagnosis not present

## 2016-03-07 DIAGNOSIS — N186 End stage renal disease: Secondary | ICD-10-CM | POA: Diagnosis not present

## 2016-03-07 DIAGNOSIS — N2581 Secondary hyperparathyroidism of renal origin: Secondary | ICD-10-CM | POA: Diagnosis not present

## 2016-03-07 DIAGNOSIS — D631 Anemia in chronic kidney disease: Secondary | ICD-10-CM | POA: Diagnosis not present

## 2016-03-07 LAB — PROTIME-INR: INR: 3.2 — AB (ref 0.9–1.1)

## 2016-03-09 ENCOUNTER — Ambulatory Visit (INDEPENDENT_AMBULATORY_CARE_PROVIDER_SITE_OTHER): Payer: Medicare Other | Admitting: Pharmacist Clinician (PhC)/ Clinical Pharmacy Specialist

## 2016-03-09 DIAGNOSIS — N186 End stage renal disease: Secondary | ICD-10-CM | POA: Diagnosis not present

## 2016-03-09 DIAGNOSIS — N2581 Secondary hyperparathyroidism of renal origin: Secondary | ICD-10-CM | POA: Diagnosis not present

## 2016-03-09 DIAGNOSIS — R509 Fever, unspecified: Secondary | ICD-10-CM | POA: Diagnosis not present

## 2016-03-09 DIAGNOSIS — D631 Anemia in chronic kidney disease: Secondary | ICD-10-CM | POA: Diagnosis not present

## 2016-03-09 DIAGNOSIS — E1129 Type 2 diabetes mellitus with other diabetic kidney complication: Secondary | ICD-10-CM | POA: Diagnosis not present

## 2016-03-09 DIAGNOSIS — I480 Paroxysmal atrial fibrillation: Secondary | ICD-10-CM

## 2016-03-09 DIAGNOSIS — Z992 Dependence on renal dialysis: Secondary | ICD-10-CM | POA: Diagnosis not present

## 2016-03-12 DIAGNOSIS — D631 Anemia in chronic kidney disease: Secondary | ICD-10-CM | POA: Diagnosis not present

## 2016-03-12 DIAGNOSIS — N2581 Secondary hyperparathyroidism of renal origin: Secondary | ICD-10-CM | POA: Diagnosis not present

## 2016-03-12 DIAGNOSIS — Z992 Dependence on renal dialysis: Secondary | ICD-10-CM | POA: Diagnosis not present

## 2016-03-12 DIAGNOSIS — N186 End stage renal disease: Secondary | ICD-10-CM | POA: Diagnosis not present

## 2016-03-12 DIAGNOSIS — E1129 Type 2 diabetes mellitus with other diabetic kidney complication: Secondary | ICD-10-CM | POA: Diagnosis not present

## 2016-03-12 DIAGNOSIS — R509 Fever, unspecified: Secondary | ICD-10-CM | POA: Diagnosis not present

## 2016-03-13 LAB — PROTIME-INR: PROTIME: 28 s — AB (ref 10.0–13.8)

## 2016-03-13 LAB — POCT INR: INR: 2.6 — AB (ref 0.9–1.1)

## 2016-03-14 DIAGNOSIS — N186 End stage renal disease: Secondary | ICD-10-CM | POA: Diagnosis not present

## 2016-03-14 DIAGNOSIS — E1129 Type 2 diabetes mellitus with other diabetic kidney complication: Secondary | ICD-10-CM | POA: Diagnosis not present

## 2016-03-14 DIAGNOSIS — R509 Fever, unspecified: Secondary | ICD-10-CM | POA: Diagnosis not present

## 2016-03-14 DIAGNOSIS — I120 Hypertensive chronic kidney disease with stage 5 chronic kidney disease or end stage renal disease: Secondary | ICD-10-CM | POA: Diagnosis not present

## 2016-03-14 DIAGNOSIS — Z992 Dependence on renal dialysis: Secondary | ICD-10-CM | POA: Diagnosis not present

## 2016-03-14 DIAGNOSIS — N2581 Secondary hyperparathyroidism of renal origin: Secondary | ICD-10-CM | POA: Diagnosis not present

## 2016-03-14 DIAGNOSIS — D631 Anemia in chronic kidney disease: Secondary | ICD-10-CM | POA: Diagnosis not present

## 2016-03-16 DIAGNOSIS — N2581 Secondary hyperparathyroidism of renal origin: Secondary | ICD-10-CM | POA: Diagnosis not present

## 2016-03-16 DIAGNOSIS — N186 End stage renal disease: Secondary | ICD-10-CM | POA: Diagnosis not present

## 2016-03-16 DIAGNOSIS — Z992 Dependence on renal dialysis: Secondary | ICD-10-CM | POA: Diagnosis not present

## 2016-03-16 DIAGNOSIS — D631 Anemia in chronic kidney disease: Secondary | ICD-10-CM | POA: Diagnosis not present

## 2016-03-16 DIAGNOSIS — E1129 Type 2 diabetes mellitus with other diabetic kidney complication: Secondary | ICD-10-CM | POA: Diagnosis not present

## 2016-03-19 DIAGNOSIS — Z992 Dependence on renal dialysis: Secondary | ICD-10-CM | POA: Diagnosis not present

## 2016-03-19 DIAGNOSIS — D631 Anemia in chronic kidney disease: Secondary | ICD-10-CM | POA: Diagnosis not present

## 2016-03-19 DIAGNOSIS — N186 End stage renal disease: Secondary | ICD-10-CM | POA: Diagnosis not present

## 2016-03-19 DIAGNOSIS — N2581 Secondary hyperparathyroidism of renal origin: Secondary | ICD-10-CM | POA: Diagnosis not present

## 2016-03-19 DIAGNOSIS — E1129 Type 2 diabetes mellitus with other diabetic kidney complication: Secondary | ICD-10-CM | POA: Diagnosis not present

## 2016-03-20 LAB — PROTIME-INR: Protime: 32.5 seconds — AB (ref 10.0–13.8)

## 2016-03-20 LAB — POCT INR: INR: 3.2 — AB (ref 0.9–1.1)

## 2016-03-21 DIAGNOSIS — E1129 Type 2 diabetes mellitus with other diabetic kidney complication: Secondary | ICD-10-CM | POA: Diagnosis not present

## 2016-03-21 DIAGNOSIS — N2581 Secondary hyperparathyroidism of renal origin: Secondary | ICD-10-CM | POA: Diagnosis not present

## 2016-03-21 DIAGNOSIS — Z992 Dependence on renal dialysis: Secondary | ICD-10-CM | POA: Diagnosis not present

## 2016-03-21 DIAGNOSIS — N186 End stage renal disease: Secondary | ICD-10-CM | POA: Diagnosis not present

## 2016-03-21 DIAGNOSIS — D631 Anemia in chronic kidney disease: Secondary | ICD-10-CM | POA: Diagnosis not present

## 2016-03-23 ENCOUNTER — Non-Acute Institutional Stay (SKILLED_NURSING_FACILITY): Payer: Medicare Other | Admitting: Nurse Practitioner

## 2016-03-23 DIAGNOSIS — I5020 Unspecified systolic (congestive) heart failure: Secondary | ICD-10-CM | POA: Diagnosis not present

## 2016-03-23 DIAGNOSIS — D631 Anemia in chronic kidney disease: Secondary | ICD-10-CM | POA: Diagnosis not present

## 2016-03-23 DIAGNOSIS — I10 Essential (primary) hypertension: Secondary | ICD-10-CM

## 2016-03-23 DIAGNOSIS — Z96651 Presence of right artificial knee joint: Secondary | ICD-10-CM | POA: Diagnosis not present

## 2016-03-23 DIAGNOSIS — N186 End stage renal disease: Secondary | ICD-10-CM | POA: Diagnosis not present

## 2016-03-23 DIAGNOSIS — E1129 Type 2 diabetes mellitus with other diabetic kidney complication: Secondary | ICD-10-CM | POA: Diagnosis not present

## 2016-03-23 DIAGNOSIS — Z992 Dependence on renal dialysis: Secondary | ICD-10-CM | POA: Diagnosis not present

## 2016-03-23 DIAGNOSIS — N2581 Secondary hyperparathyroidism of renal origin: Secondary | ICD-10-CM | POA: Diagnosis not present

## 2016-03-23 DIAGNOSIS — I480 Paroxysmal atrial fibrillation: Secondary | ICD-10-CM

## 2016-03-23 DIAGNOSIS — N189 Chronic kidney disease, unspecified: Secondary | ICD-10-CM

## 2016-03-23 NOTE — Progress Notes (Signed)
Nursing Home Location:  [facility]  Place of Service: SNF (31)  PCP: [name], MD   Code Status: Full Code  No Known Allergies  Chief Complaint  Patient presents with  . Medical Management of Chronic Issues    Routine Visit    HPI:  Patient is a 61 y.o. male seen today at [facility] for routine follow up on chronic conditions. Pt with hx of a fib on coumadin, CHF, HTN, OSA, DM, ESRD and OA. Pt at [facility] after right total knee replacement. Pt was seen last month due to abrasion and fall in therapy leading to increased pain. Pt reports pain is well controlled, using percocet twice daily. Having constipation with pain medication.  Pt has fallen twice since at [facility], amitriptyline was decreased from 75 to 50 mg and he has not fallen since. Pt reports neuropathy worse but able to tolerate this.  Report gout flare 2 months ago, no issues since Pt on coumadin due to afib, coumadin clinic managing INR.   Review of Systems:  Review of Systems  Constitutional: Negative for activity change, appetite change, fatigue and unexpected weight change.  HENT: Negative for congestion and hearing loss.   Eyes: Negative.   Respiratory: Positive for cough (due to allergies ). Negative for shortness of breath.   Cardiovascular: Negative for chest pain, palpitations and leg swelling.  Gastrointestinal: Positive for constipation. Negative for abdominal pain and diarrhea.  Genitourinary: Negative for difficulty urinating and dysuria.  Musculoskeletal: Positive for arthralgias (right knee, post op, improving) and back pain (benegay helps). Negative for myalgias.  Skin: Negative for color change and wound.  Neurological: Positive for numbness (and tingling to bilateral LE). Negative for dizziness and weakness.  Psychiatric/Behavioral: Negative for agitation, behavioral problems and confusion.    Past Medical History:  Diagnosis Date  . Allergic rhinitis 07/07/2014    . Anemia due to other cause 07/07/2014  . Arthritis    "all over"  . Atrial fibrillation ([place]) 12/30/2012  . CHF (congestive heart failure) ([place]) 12/30/2012  . Chronic lower back pain   . Diabetes mellitus with neuropathy ([place])   . Diabetic retinopathy ([place]) 12/30/2012   no medications  . Dysrhythmia    Afib  . ESRD (end stage renal disease) on dialysis ([place]) started in 2013   MWF; [facility]; [employer]  . GERD (gastroesophageal reflux disease)   . Glaucoma   . History of gout    "right big toe"  . Hyperlipidemia   . Hypertension   . Hypothyroid    01/17/16- no longer on meduication  . Morbid obesity ([place]) 12/30/2012  . Myocardial infarction    "I've had ~ 3; last one was in ~ 10/2013" (01/30/2014)  . OSA (obstructive sleep apnea) 12/30/2012   "lost weight; no longer needed CPAP; retested said I needed it; didn't followup cause I was feeling fine" (01/30/2014)  . Pulmonary hypertension 12/30/2012  . Refusal of blood transfusions as patient is Jehovah's Witness   . Stroke [facility]) ~ 2005; ~ 2005   "they were mild; I didn't even notice I'd had them"; denies residual on 01/30/2014   Past Surgical History:  Procedure Laterality Date  . AV FISTULA PLACEMENT Left ~ 10/2013   "forearm"  . CARDIAC CATHETERIZATION N/A 01/07/2015   Procedure: Left Heart Cath and Coronary Angiography;  Surgeon: Troy Sine, MD;  Location: [place] CV LAB;  Service: Cardiovascular;  Laterality: N/A;  . CLOSED REDUCTION HIP DISLOCATION Right 1970's  . JOINT  REPLACEMENT    . TOTAL HIP ARTHROPLASTY Right 1980  . TOTAL KNEE ARTHROPLASTY Right 01/24/2016   Procedure: TOTAL KNEE ARTHROPLASTY;  Surgeon: Dorna Leitz, MD;  Location: [place];  Service: Orthopedics;  Laterality: Right;   Social History:   reports that he has never smoked. He has never used smokeless tobacco. He reports that he drinks about 4.2 - 4.8 oz of alcohol per week . He reports that he does not use drugs.  Family History  Problem  Relation Age of Onset  . Arthritis Other   . Heart disease Other   . Hyperlipidemia Other   . Hypertension Other   . Kidney disease Other   . Diabetes Other   . Stroke Other   . Diabetes Mother   . Cancer Father     bone cancer  . Heart disease Father   . Diabetes Sister   . Diabetes Brother   . Stroke Brother     Medications: Patient's Medications  New Prescriptions   No medications on file  Previous Medications   ACETAMINOPHEN (TYLENOL) 650 MG CR TABLET    Take 650 mg by mouth every 8 (eight) hours as needed for pain.   ALLOPURINOL (ZYLOPRIM) 100 MG TABLET    TAKE ONE TABLET BY MOUTH TWICE DAILY   AMINO ACIDS-PROTEIN HYDROLYS (FEEDING SUPPLEMENT, PRO-STAT SUGAR FREE 64,) LIQD    Take 30 mLs by mouth 2 (two) times daily.   AMITRIPTYLINE (ELAVIL) 50 MG TABLET    Take 50 mg by mouth at bedtime.   CALCIUM ACETATE (PHOSLO) 667 MG CAPSULE    Take 667 mg by mouth 2 (two) times daily.   CARVEDILOL (COREG) 25 MG TABLET    Take 1 tablet by mouth two times daily on Monday, Wednesday, and Saturday. Give 1 tablet by mouth every Tuesday, Thursday, and Sunday morning for hypertension.   MULTIPLE VITAMIN (MULTIVITAMIN WITH MINERALS) TABS TABLET    Take 1 tablet by mouth daily.   OXYCODONE-ACETAMINOPHEN (PERCOCET/ROXICET) 5-325 MG TABLET    Take 1 tablet by mouth every 8 (eight) hours as needed for severe pain.   PRAMOXINE-MENTHOL-DIMETHICONE (GOLD BOND MEDICATED ANTI ITCH EX)    Apply to skin twice a day for itching.   RANITIDINE (ZANTAC) 150 MG TABLET    Take 150 mg by mouth 2 (two) times daily as needed for heartburn. Reported on 02/03/2015   SIMVASTATIN (ZOCOR) 20 MG TABLET    TAKE ONE TABLET BY MOUTH ONCE DAILY   SODIUM CHLORIDE (OCEAN) 0.65 % SOLN NASAL SPRAY    Place 1 spray into both nostrils as needed for congestion.   TIZANIDINE (ZANAFLEX) 2 MG TABLET    Take 1 tablet (2 mg total) by mouth every 8 (eight) hours as needed for muscle spasms.   WARFARIN (COUMADIN) 1 MG TABLET    Give 1/2  tablet along with 3 mg tablet to equal 3.5 mg on Tues, Thurs, and Sat   WARFARIN (COUMADIN) 3 MG TABLET    Take 3 mg by mouth. Mon, Wed, Fri, and Sun  Modified Medications   No medications on file  Discontinued Medications   DEXTROMETHORPHAN-GUAIFENESIN (MUCINEX DM) 30-600 MG 12HR TABLET    Take 1 tablet by mouth 2 (two) times daily.   GLUCOSE BLOOD (ONE TOUCH ULTRA TEST) TEST STRIP    1 each by Other route 2 (two) times daily. Use to check blood sugars twice a day Dx E11.9   LANCETS (ONETOUCH ULTRASOFT) LANCETS    1 each by Other route  2 (two) times daily. Use to help check blood sugars twice a day Dx E11.9     Physical Exam: Vitals:   03/23/16 1323  BP: (!) 90/57  Pulse: 88  Resp: 20  Temp: 97.3 F (36.3 C)  SpO2: 98%  Weight: 240 lb (108.9 kg)  Height: 5\' 9"  (1.753 m)    Physical Exam  Constitutional: He is oriented to person, place, and time. He appears well-developed and well-nourished. No distress.  HENT:  Head: Normocephalic and atraumatic.  Eyes: Pupils are equal, round, and reactive to light.  Neck: Normal range of motion. Neck supple.  Cardiovascular: Normal rate and normal heart sounds.  An irregularly irregular rhythm present.  Left fistula, +bruit and thrill    Pulmonary/Chest: Effort normal and breath sounds normal. No respiratory distress.  Abdominal: Soft. Bowel sounds are normal. He exhibits no distension.  Musculoskeletal: He exhibits edema (mild edema to right knee, 1+ edema bilaterally) and tenderness.  Noted to right knee  Neurological: He is alert and oriented to person, place, and time.  Skin: Skin is warm and dry. He is not diaphoretic.  Well healed right knee incision   Psychiatric: He has a normal mood and affect.    Labs reviewed: Basic Metabolic Panel:  Recent Labs  01/17/16 1047  01/25/16 0347 01/27/16 1001 01/27/16 1030 01/29/16 2006  NA 134*  < > 134*  133* 129*  --  129*  K 4.8  < > 5.6*  5.6* 5.0  --  4.6  CL 97*  --  95*   94* 89*  --  91*  CO2 29  --  21*  21* 26  --   --   GLUCOSE 134*  < > 145*  145* 165*  --  151*  BUN 33*  --  49*  50* 47*  --  63*  CREATININE 7.62*  --  9.53*  9.53* 10.06*  --  10.40*  CALCIUM 9.7  --  9.3  9.2 9.4  --   --   PHOS  --   --  8.2*  --  8.3*  --   < > = values in this interval not displayed. Liver Function Tests:  Recent Labs  01/17/16 1047 01/25/16 0347 01/27/16 1030  AST 17  --   --   ALT 23  --   --   ALKPHOS 93  --   --   BILITOT 0.7  --   --   PROT 7.1  --   --   ALBUMIN 3.2* 3.2* 2.6*   No results for input(s): LIPASE, AMYLASE in the last 8760 hours. No results for input(s): AMMONIA in the last 8760 hours. CBC:  Recent Labs  01/17/16 1047  01/25/16 0347 01/26/16 0548 01/27/16 0510 01/29/16 2006  WBC 7.6  --  12.8* 14.0* 12.0*  --   NEUTROABS 4.0  --   --   --   --   --   HGB 11.6*  < > 11.2* 9.6* 8.6* 9.5*  HCT 36.2*  < > 34.7* 30.2* 26.5* 28.0*  MCV 96.5  --  96.4 96.2 95.3  --   PLT 196  --  185 163 163  --   < > = values in this interval not displayed. TSH: No results for input(s): TSH in the last 8760 hours. A1C: Lab Results  Component Value Date   HGBA1C 6.7 (H) 01/17/2016   Lipid Panel: No results for input(s): CHOL, HDL, LDLCALC, TRIG, CHOLHDL, LDLDIRECT in the last  8760 hours.  Lab Results  Component Value Date   INR 3.2 (A) 03/20/2016   INR 2.6 (A) 03/13/2016   INR 3.2 (A) 03/07/2016   PROTIME 32.5 (A) 03/20/2016   PROTIME 28.0 (A) 03/13/2016   PROTIME 23.5 (A) 02/28/2016    Assessment/Plan 1. Paroxysmal atrial fibrillation (HCC) Stable, Rate controlled on coreg,  conts on coumadin for anticoagulation, pt following with coumadin clinic repeat INR scheduled for 03/27/16  2. Anemia of chronic renal failure, unspecified stage Worsened by post-op blood loss. Recent hgb stable.   3. Status post right knee replacement Due to severe OA, pt currently in therapy at [facility], failed step test. Plans to leave with brother  when he is ready.  Pain controlled on current regimen.   4. Systolic congestive heart failure, unspecified congestive heart failure chronicity (HCC) -euvolemic, conts on dialysis and coreg   5. Essential hypertension -blood pressure stable, conts on coreg.  6. End stage renal disease on dialysis [facility]) Conts on dialysis Monday, Wednesday Friday  7. Constipation Due to pain medication. Will start senokot-s PO daily  Allysha Tryon K. [place], [facility] [phone] 8 am - 5 pm) [phone] (after PYKDX)[id number]

## 2016-03-26 DIAGNOSIS — E1129 Type 2 diabetes mellitus with other diabetic kidney complication: Secondary | ICD-10-CM | POA: Diagnosis not present

## 2016-03-26 DIAGNOSIS — N186 End stage renal disease: Secondary | ICD-10-CM | POA: Diagnosis not present

## 2016-03-26 DIAGNOSIS — Z992 Dependence on renal dialysis: Secondary | ICD-10-CM | POA: Diagnosis not present

## 2016-03-26 DIAGNOSIS — N2581 Secondary hyperparathyroidism of renal origin: Secondary | ICD-10-CM | POA: Diagnosis not present

## 2016-03-26 DIAGNOSIS — D631 Anemia in chronic kidney disease: Secondary | ICD-10-CM | POA: Diagnosis not present

## 2016-03-27 DIAGNOSIS — M1711 Unilateral primary osteoarthritis, right knee: Secondary | ICD-10-CM | POA: Diagnosis not present

## 2016-03-27 LAB — POCT INR: INR: 3.1 — AB (ref 0.9–1.1)

## 2016-03-27 LAB — PROTIME-INR: Protime: 32.2 seconds — AB (ref 10.0–13.8)

## 2016-03-28 DIAGNOSIS — E1129 Type 2 diabetes mellitus with other diabetic kidney complication: Secondary | ICD-10-CM | POA: Diagnosis not present

## 2016-03-28 DIAGNOSIS — D631 Anemia in chronic kidney disease: Secondary | ICD-10-CM | POA: Diagnosis not present

## 2016-03-28 DIAGNOSIS — Z992 Dependence on renal dialysis: Secondary | ICD-10-CM | POA: Diagnosis not present

## 2016-03-28 DIAGNOSIS — N186 End stage renal disease: Secondary | ICD-10-CM | POA: Diagnosis not present

## 2016-03-28 DIAGNOSIS — N2581 Secondary hyperparathyroidism of renal origin: Secondary | ICD-10-CM | POA: Diagnosis not present

## 2016-03-30 DIAGNOSIS — D631 Anemia in chronic kidney disease: Secondary | ICD-10-CM | POA: Diagnosis not present

## 2016-03-30 DIAGNOSIS — E1129 Type 2 diabetes mellitus with other diabetic kidney complication: Secondary | ICD-10-CM | POA: Diagnosis not present

## 2016-03-30 DIAGNOSIS — Z992 Dependence on renal dialysis: Secondary | ICD-10-CM | POA: Diagnosis not present

## 2016-03-30 DIAGNOSIS — N2581 Secondary hyperparathyroidism of renal origin: Secondary | ICD-10-CM | POA: Diagnosis not present

## 2016-03-30 DIAGNOSIS — N186 End stage renal disease: Secondary | ICD-10-CM | POA: Diagnosis not present

## 2016-04-02 DIAGNOSIS — D631 Anemia in chronic kidney disease: Secondary | ICD-10-CM | POA: Diagnosis not present

## 2016-04-02 DIAGNOSIS — N186 End stage renal disease: Secondary | ICD-10-CM | POA: Diagnosis not present

## 2016-04-02 DIAGNOSIS — Z992 Dependence on renal dialysis: Secondary | ICD-10-CM | POA: Diagnosis not present

## 2016-04-02 DIAGNOSIS — N2581 Secondary hyperparathyroidism of renal origin: Secondary | ICD-10-CM | POA: Diagnosis not present

## 2016-04-02 DIAGNOSIS — E1129 Type 2 diabetes mellitus with other diabetic kidney complication: Secondary | ICD-10-CM | POA: Diagnosis not present

## 2016-04-03 ENCOUNTER — Non-Acute Institutional Stay (SKILLED_NURSING_FACILITY): Payer: Medicare Other | Admitting: Nurse Practitioner

## 2016-04-03 ENCOUNTER — Encounter: Payer: Self-pay | Admitting: Nurse Practitioner

## 2016-04-03 DIAGNOSIS — I5020 Unspecified systolic (congestive) heart failure: Secondary | ICD-10-CM | POA: Diagnosis not present

## 2016-04-03 DIAGNOSIS — I480 Paroxysmal atrial fibrillation: Secondary | ICD-10-CM | POA: Diagnosis not present

## 2016-04-03 DIAGNOSIS — I10 Essential (primary) hypertension: Secondary | ICD-10-CM

## 2016-04-03 DIAGNOSIS — Z96651 Presence of right artificial knee joint: Secondary | ICD-10-CM

## 2016-04-03 DIAGNOSIS — N189 Chronic kidney disease, unspecified: Secondary | ICD-10-CM

## 2016-04-03 DIAGNOSIS — Z992 Dependence on renal dialysis: Secondary | ICD-10-CM | POA: Diagnosis not present

## 2016-04-03 DIAGNOSIS — M1712 Unilateral primary osteoarthritis, left knee: Secondary | ICD-10-CM | POA: Diagnosis not present

## 2016-04-03 DIAGNOSIS — N186 End stage renal disease: Secondary | ICD-10-CM | POA: Diagnosis not present

## 2016-04-03 DIAGNOSIS — D631 Anemia in chronic kidney disease: Secondary | ICD-10-CM

## 2016-04-03 LAB — POCT INR: INR: 2.4 — AB (ref 0.9–1.1)

## 2016-04-03 LAB — PROTIME-INR: Protime: 26 seconds — AB (ref 10.0–13.8)

## 2016-04-03 MED ORDER — CARVEDILOL 25 MG PO TABS: ORAL_TABLET | ORAL | 0 refills | Status: DC

## 2016-04-03 MED ORDER — ALLOPURINOL 100 MG PO TABS: 100.0000 mg | ORAL_TABLET | Freq: Two times a day (BID) | ORAL | 0 refills | Status: DC

## 2016-04-03 MED ORDER — CALCIUM ACETATE 667 MG PO CAPS: 667.0000 mg | ORAL_CAPSULE | Freq: Two times a day (BID) | ORAL | 0 refills | Status: DC

## 2016-04-03 MED ORDER — SIMVASTATIN 20 MG PO TABS: 20.0000 mg | ORAL_TABLET | Freq: Every day | ORAL | 0 refills | Status: DC

## 2016-04-03 MED ORDER — AMITRIPTYLINE HCL 50 MG PO TABS: 50.0000 mg | ORAL_TABLET | Freq: Every day | ORAL | 0 refills | Status: DC

## 2016-04-03 NOTE — Progress Notes (Addendum)
Nursing Home Location:  [facility]  Place of Service: SNF (31)  PCP: [name], MD   Code Status: Full Code  No Known Allergies  Chief Complaint  Patient presents with  . Discharge Note    HPI:  Patient is a 61 y.o. male seen today at [facility] for discharge home. Pt with hx of a fib on coumadin, CHF, HTN, OSA, DM, ESRD and OA. Pt at [facility] after right total knee replacement. Patient currently doing well with therapy, able to navigate stairs better and now stable to discharge home with home health. constipation has improved on senokot-s INR was not drawn until later this morning, pt is getting INR checked weekly at [facility].   Review of Systems:  Review of Systems  Constitutional: Negative for activity change, appetite change, fatigue and unexpected weight change.  HENT: Negative for congestion and hearing loss.   Eyes: Negative.   Respiratory: Negative for cough and shortness of breath.   Cardiovascular: Negative for chest pain, palpitations and leg swelling.  Gastrointestinal: Positive for constipation. Negative for abdominal pain and diarrhea.  Genitourinary: Negative for difficulty urinating and dysuria.  Musculoskeletal: Positive for arthralgias (right knee, post op, improving) and back pain (benegay helps). Negative for myalgias.  Skin: Negative for color change and wound.  Neurological: Positive for numbness (and tingling to bilateral LE). Negative for dizziness and weakness.  Psychiatric/Behavioral: Negative for agitation, behavioral problems and confusion.    Past Medical History:  Diagnosis Date  . Allergic rhinitis 07/07/2014  . Anemia due to other cause 07/07/2014  . Arthritis    "all over"  . Atrial fibrillation ([place]) 12/30/2012  . CHF (congestive heart failure) ([place]) 12/30/2012  . Chronic lower back pain   . Diabetes mellitus with neuropathy ([place])   . Diabetic retinopathy ([place]) 12/30/2012   no medications  . Dysrhythmia     Afib  . ESRD (end stage renal disease) on dialysis ([place]) started in 2013   MWF; [facility]; [employer]  . GERD (gastroesophageal reflux disease)   . Glaucoma   . History of gout    "right big toe"  . Hyperlipidemia   . Hypertension   . Hypothyroid    01/17/16- no longer on meduication  . Morbid obesity ([place]) 12/30/2012  . Myocardial infarction    "I've had ~ 3; last one was in ~ 10/2013" (01/30/2014)  . OSA (obstructive sleep apnea) 12/30/2012   "lost weight; no longer needed CPAP; retested said I needed it; didn't followup cause I was feeling fine" (01/30/2014)  . Pulmonary hypertension 12/30/2012  . Refusal of blood transfusions as patient is Jehovah's Witness   . Stroke [facility]) ~ 2005; ~ 2005   "they were mild; I didn't even notice I'd had them"; denies residual on 01/30/2014   Past Surgical History:  Procedure Laterality Date  . AV FISTULA PLACEMENT Left ~ 10/2013   "forearm"  . CARDIAC CATHETERIZATION N/A 01/07/2015   Procedure: Left Heart Cath and Coronary Angiography;  Surgeon: Troy Sine, MD;  Location: [place] [facility];  Service: Cardiovascular;  Laterality: N/A;  . CLOSED REDUCTION HIP DISLOCATION Right 1970's  . JOINT REPLACEMENT    . TOTAL HIP ARTHROPLASTY Right 1980  . TOTAL KNEE ARTHROPLASTY Right 01/24/2016   Procedure: TOTAL KNEE ARTHROPLASTY;  Surgeon: Dorna Leitz, MD;  Location: [place];  Service: Orthopedics;  Laterality: Right;   Social History:   reports that he has never smoked. He has never used smokeless tobacco. He reports that  he drinks about 4.2 - 4.8 oz of alcohol per week . He reports that he does not use drugs.  Family History  Problem Relation Age of Onset  . Arthritis Other   . Heart disease Other   . Hyperlipidemia Other   . Hypertension Other   . Kidney disease Other   . Diabetes Other   . Stroke Other   . Diabetes Mother   . Cancer Father     bone cancer  . Heart disease Father   . Diabetes Sister   . Diabetes Brother   .  Stroke Brother     Medications: Patient's Medications  New Prescriptions   No medications on file  Previous Medications   ACETAMINOPHEN (TYLENOL) 650 MG CR TABLET    Take 650 mg by mouth every 8 (eight) hours as needed for pain.   ALLOPURINOL (ZYLOPRIM) 100 MG TABLET    TAKE ONE TABLET BY MOUTH TWICE DAILY   AMINO ACIDS-PROTEIN HYDROLYS (FEEDING SUPPLEMENT, PRO-STAT SUGAR FREE 64,) LIQD    Take 30 mLs by mouth 2 (two) times daily.   AMITRIPTYLINE (ELAVIL) 50 MG TABLET    Take 50 mg by mouth at bedtime.   CALCIUM ACETATE (PHOSLO) 667 MG CAPSULE    Take 667 mg by mouth 2 (two) times daily.   CARVEDILOL (COREG) 25 MG TABLET    Take 1 tablet by mouth two times daily on Monday, Wednesday, and Saturday. Give 1 tablet by mouth every Tuesday, Thursday, and Sunday morning for hypertension.   DEXTROMETHORPHAN-GUAIFENESIN (MUCINEX DM) 30-600 MG TB12    Take 1 tablet by mouth 2 (two) times daily.   DOCUSATE SODIUM (COLACE) 100 MG CAPSULE    Take 100 mg by mouth 2 (two) times daily.   MULTIPLE VITAMIN (MULTIVITAMIN WITH MINERALS) TABS TABLET    Take 1 tablet by mouth daily.   OXYCODONE-ACETAMINOPHEN (PERCOCET/ROXICET) 5-325 MG TABLET    Take 1 tablet by mouth every 8 (eight) hours as needed for severe pain.   PRAMOXINE-MENTHOL-DIMETHICONE (GOLD BOND MEDICATED ANTI ITCH EX)    Apply to skin twice a day for itching.   RANITIDINE (ZANTAC) 150 MG TABLET    Take 150 mg by mouth 2 (two) times daily as needed for heartburn. Reported on 02/03/2015   SENNA (SENOKOT) 8.6 MG TABLET    Take 1 tablet by mouth daily.   SIMVASTATIN (ZOCOR) 20 MG TABLET    TAKE ONE TABLET BY MOUTH ONCE DAILY   SODIUM CHLORIDE (OCEAN) 0.65 % SOLN NASAL SPRAY    Place 1 spray into both nostrils as needed for congestion.   WARFARIN (COUMADIN) 1 MG TABLET    Give 1/2 tablet along with 3 mg tablet to equal 3.5 mg on Tuesday and Thursday.   WARFARIN (COUMADIN) 3 MG TABLET    Take 3 mg by mouth. Mon, Wed, Fri, Sat and Sun  Modified Medications    No medications on file  Discontinued Medications   TIZANIDINE (ZANAFLEX) 2 MG TABLET    Take 1 tablet (2 mg total) by mouth every 8 (eight) hours as needed for muscle spasms.     Physical Exam: Vitals:   04/03/16 1014  BP: 101/68  Pulse: 63  Resp: 17  Temp: 97.3 F (36.3 C)  SpO2: 95%  Weight: 240 lb (108.9 kg)  Height: 5\' 9"  (1.753 m)    Physical Exam  Constitutional: He is oriented to person, place, and time. He appears well-developed and well-nourished. No distress.  HENT:  Head: Normocephalic and atraumatic.  Eyes: Pupils are equal, round, and reactive to light.  Neck: Normal range of motion. Neck supple.  Cardiovascular: Normal rate and normal heart sounds.  An irregularly irregular rhythm present.  Left fistula, +bruit and thrill    Pulmonary/Chest: Effort normal and breath sounds normal. No respiratory distress.  Abdominal: Soft. Bowel sounds are normal. He exhibits no distension.  Musculoskeletal: He exhibits edema (mild edema to right knee, 1+ edema bilaterally). He exhibits no tenderness.  Neurological: He is alert and oriented to person, place, and time.  Skin: Skin is warm and dry. He is not diaphoretic.  Well healed right knee incision   Psychiatric: He has a normal mood and affect.    Labs reviewed: Basic Metabolic Panel:  Recent Labs  01/17/16 1047  01/25/16 0347 01/27/16 1001 01/27/16 1030 01/29/16 2006  NA 134*  < > 134*  133* 129*  --  129*  K 4.8  < > 5.6*  5.6* 5.0  --  4.6  CL 97*  --  95*  94* 89*  --  91*  CO2 29  --  21*  21* 26  --   --   GLUCOSE 134*  < > 145*  145* 165*  --  151*  BUN 33*  --  49*  50* 47*  --  63*  CREATININE 7.62*  --  9.53*  9.53* 10.06*  --  10.40*  CALCIUM 9.7  --  9.3  9.2 9.4  --   --   PHOS  --   --  8.2*  --  8.3*  --   < > = values in this interval not displayed. Liver Function Tests:  Recent Labs  01/17/16 1047 01/25/16 0347 01/27/16 1030  AST 17  --   --   ALT 23  --   --   ALKPHOS 93   --   --   BILITOT 0.7  --   --   PROT 7.1  --   --   ALBUMIN 3.2* 3.2* 2.6*   No results for input(s): LIPASE, AMYLASE in the last 8760 hours. No results for input(s): AMMONIA in the last 8760 hours. CBC:  Recent Labs  01/17/16 1047  01/25/16 0347 01/26/16 0548 01/27/16 0510 01/29/16 2006  WBC 7.6  --  12.8* 14.0* 12.0*  --   NEUTROABS 4.0  --   --   --   --   --   HGB 11.6*  < > 11.2* 9.6* 8.6* 9.5*  HCT 36.2*  < > 34.7* 30.2* 26.5* 28.0*  MCV 96.5  --  96.4 96.2 95.3  --   PLT 196  --  185 163 163  --   < > = values in this interval not displayed. TSH: No results for input(s): TSH in the last 8760 hours. A1C: Lab Results  Component Value Date   HGBA1C 6.7 (H) 01/17/2016   Lipid Panel: No results for input(s): CHOL, HDL, LDLCALC, TRIG, CHOLHDL, LDLDIRECT in the last 8760 hours.  Lab Results  Component Value Date   INR 3.1 (A) 03/27/2016   INR 3.2 (A) 03/20/2016   INR 2.6 (A) 03/13/2016   PROTIME 32.2 (A) 03/27/2016   PROTIME 32.5 (A) 03/20/2016   PROTIME 28.0 (A) 03/13/2016    Assessment/Plan 1. Paroxysmal atrial fibrillation (HCC) Rate controlled, conts on coreg and coumadin. INR today which will determine out patient dose and recheck.  - carvedilol (COREG) 25 MG tablet; Take 1 tablet by mouth two times daily on Monday, Wednesday, and  Saturday. Give 1 tablet by mouth every Tuesday, Thursday, and Sunday  Dispense: 60 tablet; Refill: 0  2. Anemia of chronic renal failure, unspecified stage Improved on recent labs, PCP/dialysis to monitor as outpatient   3. Status post right knee replacement Doing well with therapy, no pain noted, remains on coumadin for anticoagulation due to afib.   4. Systolic congestive heart failure, unspecified congestive heart failure chronicity (HCC) -stable, no signs of worsening HF, cont current regimen.  - carvedilol (COREG) 25 MG tablet; Take 1 tablet by mouth two times daily on Monday, Wednesday, and Saturday. Give 1 tablet by mouth  every Tuesday, Thursday, and Sunday  Dispense: 60 tablet; Refill: 0  5. Essential hypertension -blood pressure stable, cont coreg - carvedilol (COREG) 25 MG tablet; Take 1 tablet by mouth two times daily on Monday, Wednesday, and Saturday. Give 1 tablet by mouth every Tuesday, Thursday, and Sunday  Dispense: 60 tablet; Refill: 0  6. End stage renal disease on dialysis ([place]) conts on dialysis M,W,F - calcium acetate (PHOSLO) 667 MG capsule; Take 1 capsule (667 mg total) by mouth 2 (two) times daily.  Dispense: 60 capsule; Refill: 0  pt is stable for discharge-will need PT/OT/Nursing per home health. DME needed includes 3 n1 and WC. Rx written for oxycodone/apap #30/0, other medication sent to pharmacy via epic.  will need to follow up with PCP within 2 weeks.   INR therapeutic, will cont current coumadin and nursing to follow up INR in 1 week  Einer Meals K. Linton Hall, [place] [facility] [phone] 8 am - 5 pm) [phone] (after QIONG)[id number]

## 2016-04-04 DIAGNOSIS — Z992 Dependence on renal dialysis: Secondary | ICD-10-CM | POA: Diagnosis not present

## 2016-04-04 DIAGNOSIS — N186 End stage renal disease: Secondary | ICD-10-CM | POA: Diagnosis not present

## 2016-04-04 DIAGNOSIS — N2581 Secondary hyperparathyroidism of renal origin: Secondary | ICD-10-CM | POA: Diagnosis not present

## 2016-04-04 DIAGNOSIS — E1129 Type 2 diabetes mellitus with other diabetic kidney complication: Secondary | ICD-10-CM | POA: Diagnosis not present

## 2016-04-04 DIAGNOSIS — D631 Anemia in chronic kidney disease: Secondary | ICD-10-CM | POA: Diagnosis not present

## 2016-04-04 MED ORDER — WARFARIN SODIUM 3 MG PO TABS: 3.0000 mg | ORAL_TABLET | Freq: Every day | ORAL | 0 refills | Status: DC

## 2016-04-04 MED ORDER — WARFARIN SODIUM 1 MG PO TABS: ORAL_TABLET | ORAL | 0 refills | Status: DC

## 2016-04-04 NOTE — Addendum Note (Signed)
Addended by: Lauree Chandler on: 04/04/2016 12:38 PM   Modules accepted: Orders

## 2016-04-05 DIAGNOSIS — M159 Polyosteoarthritis, unspecified: Secondary | ICD-10-CM | POA: Diagnosis not present

## 2016-04-05 DIAGNOSIS — E1140 Type 2 diabetes mellitus with diabetic neuropathy, unspecified: Secondary | ICD-10-CM | POA: Diagnosis not present

## 2016-04-05 DIAGNOSIS — E1122 Type 2 diabetes mellitus with diabetic chronic kidney disease: Secondary | ICD-10-CM | POA: Diagnosis not present

## 2016-04-05 DIAGNOSIS — I132 Hypertensive heart and chronic kidney disease with heart failure and with stage 5 chronic kidney disease, or end stage renal disease: Secondary | ICD-10-CM | POA: Diagnosis not present

## 2016-04-05 DIAGNOSIS — I5042 Chronic combined systolic (congestive) and diastolic (congestive) heart failure: Secondary | ICD-10-CM | POA: Diagnosis not present

## 2016-04-05 DIAGNOSIS — Z471 Aftercare following joint replacement surgery: Secondary | ICD-10-CM | POA: Diagnosis not present

## 2016-04-06 DIAGNOSIS — N186 End stage renal disease: Secondary | ICD-10-CM | POA: Diagnosis not present

## 2016-04-06 DIAGNOSIS — E1129 Type 2 diabetes mellitus with other diabetic kidney complication: Secondary | ICD-10-CM | POA: Diagnosis not present

## 2016-04-06 DIAGNOSIS — N2581 Secondary hyperparathyroidism of renal origin: Secondary | ICD-10-CM | POA: Diagnosis not present

## 2016-04-06 DIAGNOSIS — Z992 Dependence on renal dialysis: Secondary | ICD-10-CM | POA: Diagnosis not present

## 2016-04-06 DIAGNOSIS — D631 Anemia in chronic kidney disease: Secondary | ICD-10-CM | POA: Diagnosis not present

## 2016-04-07 DIAGNOSIS — I132 Hypertensive heart and chronic kidney disease with heart failure and with stage 5 chronic kidney disease, or end stage renal disease: Secondary | ICD-10-CM | POA: Diagnosis not present

## 2016-04-07 DIAGNOSIS — E1140 Type 2 diabetes mellitus with diabetic neuropathy, unspecified: Secondary | ICD-10-CM | POA: Diagnosis not present

## 2016-04-07 DIAGNOSIS — Z471 Aftercare following joint replacement surgery: Secondary | ICD-10-CM | POA: Diagnosis not present

## 2016-04-07 DIAGNOSIS — I5042 Chronic combined systolic (congestive) and diastolic (congestive) heart failure: Secondary | ICD-10-CM | POA: Diagnosis not present

## 2016-04-07 DIAGNOSIS — M159 Polyosteoarthritis, unspecified: Secondary | ICD-10-CM | POA: Diagnosis not present

## 2016-04-07 DIAGNOSIS — E1122 Type 2 diabetes mellitus with diabetic chronic kidney disease: Secondary | ICD-10-CM | POA: Diagnosis not present

## 2016-04-09 DIAGNOSIS — E1129 Type 2 diabetes mellitus with other diabetic kidney complication: Secondary | ICD-10-CM | POA: Diagnosis not present

## 2016-04-09 DIAGNOSIS — D631 Anemia in chronic kidney disease: Secondary | ICD-10-CM | POA: Diagnosis not present

## 2016-04-09 DIAGNOSIS — Z992 Dependence on renal dialysis: Secondary | ICD-10-CM | POA: Diagnosis not present

## 2016-04-09 DIAGNOSIS — N186 End stage renal disease: Secondary | ICD-10-CM | POA: Diagnosis not present

## 2016-04-09 DIAGNOSIS — N2581 Secondary hyperparathyroidism of renal origin: Secondary | ICD-10-CM | POA: Diagnosis not present

## 2016-04-11 DIAGNOSIS — N186 End stage renal disease: Secondary | ICD-10-CM | POA: Diagnosis not present

## 2016-04-11 DIAGNOSIS — Z992 Dependence on renal dialysis: Secondary | ICD-10-CM | POA: Diagnosis not present

## 2016-04-11 DIAGNOSIS — I120 Hypertensive chronic kidney disease with stage 5 chronic kidney disease or end stage renal disease: Secondary | ICD-10-CM | POA: Diagnosis not present

## 2016-04-11 DIAGNOSIS — D631 Anemia in chronic kidney disease: Secondary | ICD-10-CM | POA: Diagnosis not present

## 2016-04-11 DIAGNOSIS — E1129 Type 2 diabetes mellitus with other diabetic kidney complication: Secondary | ICD-10-CM | POA: Diagnosis not present

## 2016-04-11 DIAGNOSIS — N2581 Secondary hyperparathyroidism of renal origin: Secondary | ICD-10-CM | POA: Diagnosis not present

## 2016-04-12 DIAGNOSIS — Z96651 Presence of right artificial knee joint: Secondary | ICD-10-CM | POA: Diagnosis not present

## 2016-04-12 DIAGNOSIS — M25661 Stiffness of right knee, not elsewhere classified: Secondary | ICD-10-CM | POA: Diagnosis not present

## 2016-04-12 DIAGNOSIS — R262 Difficulty in walking, not elsewhere classified: Secondary | ICD-10-CM | POA: Diagnosis not present

## 2016-04-12 DIAGNOSIS — M25561 Pain in right knee: Secondary | ICD-10-CM | POA: Diagnosis not present

## 2016-04-13 DIAGNOSIS — N186 End stage renal disease: Secondary | ICD-10-CM | POA: Diagnosis not present

## 2016-04-13 DIAGNOSIS — N2581 Secondary hyperparathyroidism of renal origin: Secondary | ICD-10-CM | POA: Diagnosis not present

## 2016-04-13 DIAGNOSIS — D631 Anemia in chronic kidney disease: Secondary | ICD-10-CM | POA: Diagnosis not present

## 2016-04-13 DIAGNOSIS — E1129 Type 2 diabetes mellitus with other diabetic kidney complication: Secondary | ICD-10-CM | POA: Diagnosis not present

## 2016-04-13 DIAGNOSIS — Z992 Dependence on renal dialysis: Secondary | ICD-10-CM | POA: Diagnosis not present

## 2016-04-16 DIAGNOSIS — D631 Anemia in chronic kidney disease: Secondary | ICD-10-CM | POA: Diagnosis not present

## 2016-04-16 DIAGNOSIS — N186 End stage renal disease: Secondary | ICD-10-CM | POA: Diagnosis not present

## 2016-04-16 DIAGNOSIS — E1129 Type 2 diabetes mellitus with other diabetic kidney complication: Secondary | ICD-10-CM | POA: Diagnosis not present

## 2016-04-16 DIAGNOSIS — N2581 Secondary hyperparathyroidism of renal origin: Secondary | ICD-10-CM | POA: Diagnosis not present

## 2016-04-16 DIAGNOSIS — Z992 Dependence on renal dialysis: Secondary | ICD-10-CM | POA: Diagnosis not present

## 2016-04-17 DIAGNOSIS — M25661 Stiffness of right knee, not elsewhere classified: Secondary | ICD-10-CM | POA: Diagnosis not present

## 2016-04-17 DIAGNOSIS — M25561 Pain in right knee: Secondary | ICD-10-CM | POA: Diagnosis not present

## 2016-04-17 DIAGNOSIS — R262 Difficulty in walking, not elsewhere classified: Secondary | ICD-10-CM | POA: Diagnosis not present

## 2016-04-17 DIAGNOSIS — Z96651 Presence of right artificial knee joint: Secondary | ICD-10-CM | POA: Diagnosis not present

## 2016-04-18 DIAGNOSIS — D631 Anemia in chronic kidney disease: Secondary | ICD-10-CM | POA: Diagnosis not present

## 2016-04-18 DIAGNOSIS — N186 End stage renal disease: Secondary | ICD-10-CM | POA: Diagnosis not present

## 2016-04-18 DIAGNOSIS — N2581 Secondary hyperparathyroidism of renal origin: Secondary | ICD-10-CM | POA: Diagnosis not present

## 2016-04-18 DIAGNOSIS — Z992 Dependence on renal dialysis: Secondary | ICD-10-CM | POA: Diagnosis not present

## 2016-04-18 DIAGNOSIS — E1129 Type 2 diabetes mellitus with other diabetic kidney complication: Secondary | ICD-10-CM | POA: Diagnosis not present

## 2016-04-18 IMAGING — CR DG CHEST 2V
2 series · 2 of 2 positions shown · non-contrast
Comparison: None.

CLINICAL DATA: Short of breath and wheezing for 2 days.

EXAM:
CHEST  2 VIEW

[view not recorded (1 of 2)]
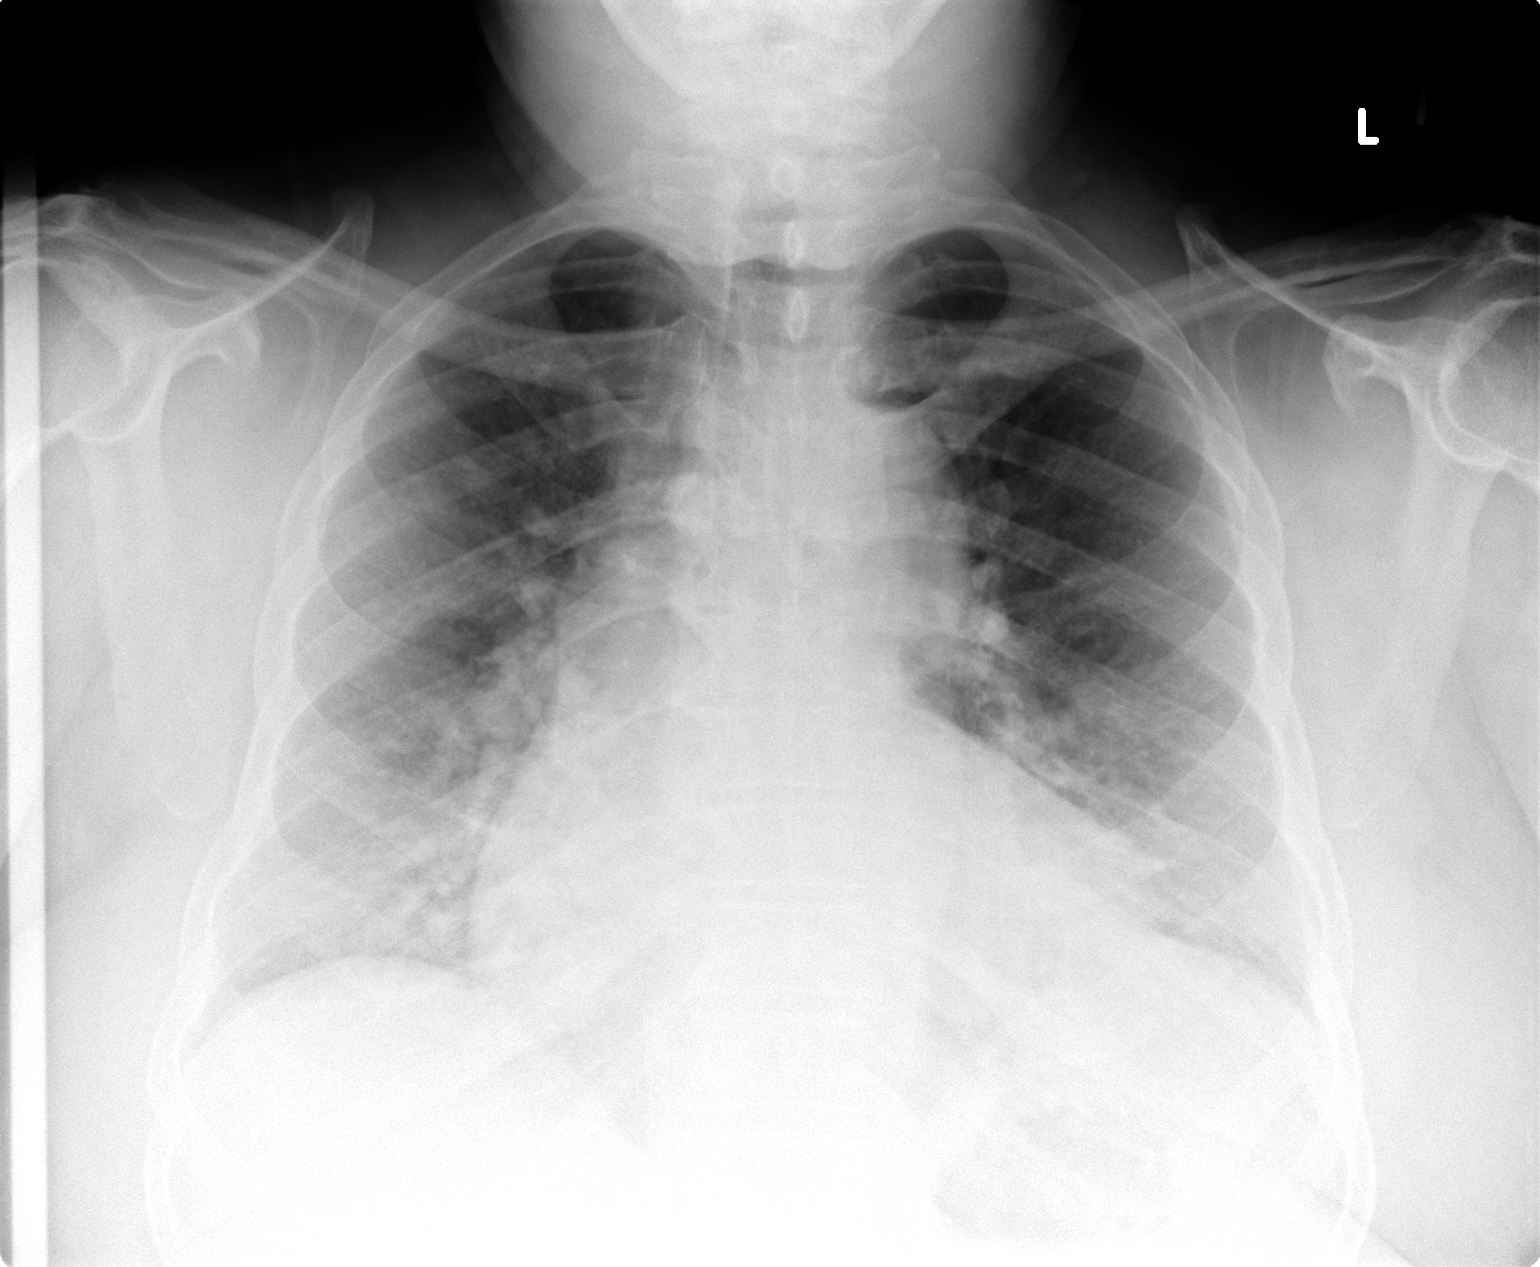

[view not recorded (2 of 2)]
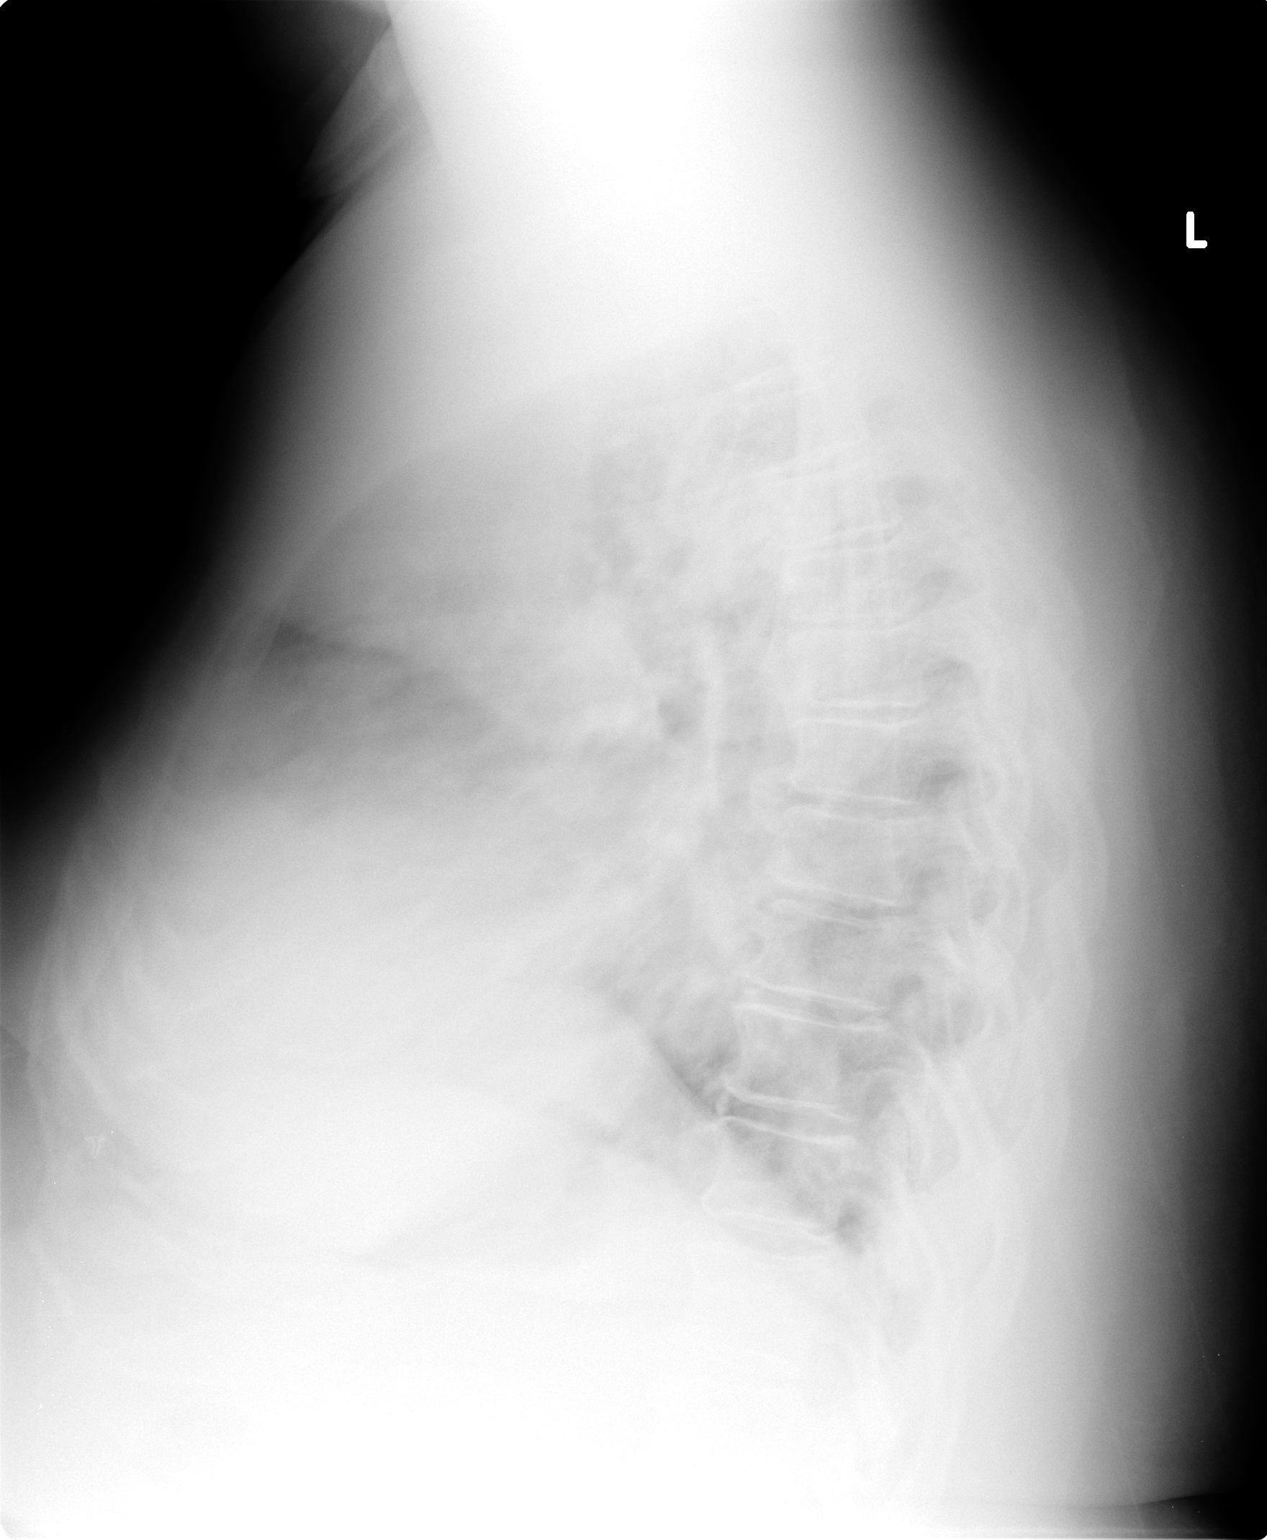

[2 of 2 positions shown; findings below may reference images not displayed]

FINDINGS: Cardiac silhouette is mildly enlarged. Normal mediastinal and hilar
contours.

Lungs are clear.  No pleural effusion.  No pneumothorax.

The bony thorax is intact.
IMPRESSION: No acute cardiopulmonary disease.

## 2016-04-19 DIAGNOSIS — R262 Difficulty in walking, not elsewhere classified: Secondary | ICD-10-CM | POA: Diagnosis not present

## 2016-04-19 DIAGNOSIS — M25661 Stiffness of right knee, not elsewhere classified: Secondary | ICD-10-CM | POA: Diagnosis not present

## 2016-04-19 DIAGNOSIS — M25561 Pain in right knee: Secondary | ICD-10-CM | POA: Diagnosis not present

## 2016-04-19 DIAGNOSIS — Z96651 Presence of right artificial knee joint: Secondary | ICD-10-CM | POA: Diagnosis not present

## 2016-04-20 DIAGNOSIS — Z992 Dependence on renal dialysis: Secondary | ICD-10-CM | POA: Diagnosis not present

## 2016-04-20 DIAGNOSIS — E1129 Type 2 diabetes mellitus with other diabetic kidney complication: Secondary | ICD-10-CM | POA: Diagnosis not present

## 2016-04-20 DIAGNOSIS — N186 End stage renal disease: Secondary | ICD-10-CM | POA: Diagnosis not present

## 2016-04-20 DIAGNOSIS — N2581 Secondary hyperparathyroidism of renal origin: Secondary | ICD-10-CM | POA: Diagnosis not present

## 2016-04-20 DIAGNOSIS — D631 Anemia in chronic kidney disease: Secondary | ICD-10-CM | POA: Diagnosis not present

## 2016-04-23 DIAGNOSIS — D631 Anemia in chronic kidney disease: Secondary | ICD-10-CM | POA: Diagnosis not present

## 2016-04-23 DIAGNOSIS — N2581 Secondary hyperparathyroidism of renal origin: Secondary | ICD-10-CM | POA: Diagnosis not present

## 2016-04-23 DIAGNOSIS — N186 End stage renal disease: Secondary | ICD-10-CM | POA: Diagnosis not present

## 2016-04-23 DIAGNOSIS — E1129 Type 2 diabetes mellitus with other diabetic kidney complication: Secondary | ICD-10-CM | POA: Diagnosis not present

## 2016-04-23 DIAGNOSIS — Z992 Dependence on renal dialysis: Secondary | ICD-10-CM | POA: Diagnosis not present

## 2016-04-25 DIAGNOSIS — N186 End stage renal disease: Secondary | ICD-10-CM | POA: Diagnosis not present

## 2016-04-25 DIAGNOSIS — Z992 Dependence on renal dialysis: Secondary | ICD-10-CM | POA: Diagnosis not present

## 2016-04-25 DIAGNOSIS — D631 Anemia in chronic kidney disease: Secondary | ICD-10-CM | POA: Diagnosis not present

## 2016-04-25 DIAGNOSIS — N2581 Secondary hyperparathyroidism of renal origin: Secondary | ICD-10-CM | POA: Diagnosis not present

## 2016-04-25 DIAGNOSIS — E1129 Type 2 diabetes mellitus with other diabetic kidney complication: Secondary | ICD-10-CM | POA: Diagnosis not present

## 2016-04-25 DIAGNOSIS — I4891 Unspecified atrial fibrillation: Secondary | ICD-10-CM | POA: Diagnosis not present

## 2016-04-25 LAB — PROTIME-INR: INR: 2.1 — AB (ref 0.9–1.1)

## 2016-04-26 DIAGNOSIS — Z96651 Presence of right artificial knee joint: Secondary | ICD-10-CM | POA: Diagnosis not present

## 2016-04-26 DIAGNOSIS — M25661 Stiffness of right knee, not elsewhere classified: Secondary | ICD-10-CM | POA: Diagnosis not present

## 2016-04-26 DIAGNOSIS — R262 Difficulty in walking, not elsewhere classified: Secondary | ICD-10-CM | POA: Diagnosis not present

## 2016-04-26 DIAGNOSIS — M25561 Pain in right knee: Secondary | ICD-10-CM | POA: Diagnosis not present

## 2016-04-27 DIAGNOSIS — N2581 Secondary hyperparathyroidism of renal origin: Secondary | ICD-10-CM | POA: Diagnosis not present

## 2016-04-27 DIAGNOSIS — N186 End stage renal disease: Secondary | ICD-10-CM | POA: Diagnosis not present

## 2016-04-27 DIAGNOSIS — Z992 Dependence on renal dialysis: Secondary | ICD-10-CM | POA: Diagnosis not present

## 2016-04-27 DIAGNOSIS — D631 Anemia in chronic kidney disease: Secondary | ICD-10-CM | POA: Diagnosis not present

## 2016-04-27 DIAGNOSIS — E1129 Type 2 diabetes mellitus with other diabetic kidney complication: Secondary | ICD-10-CM | POA: Diagnosis not present

## 2016-04-30 ENCOUNTER — Ambulatory Visit (INDEPENDENT_AMBULATORY_CARE_PROVIDER_SITE_OTHER): Payer: Medicare Other | Admitting: Pharmacist

## 2016-04-30 DIAGNOSIS — Z992 Dependence on renal dialysis: Secondary | ICD-10-CM | POA: Diagnosis not present

## 2016-04-30 DIAGNOSIS — N186 End stage renal disease: Secondary | ICD-10-CM | POA: Diagnosis not present

## 2016-04-30 DIAGNOSIS — D631 Anemia in chronic kidney disease: Secondary | ICD-10-CM | POA: Diagnosis not present

## 2016-04-30 DIAGNOSIS — N2581 Secondary hyperparathyroidism of renal origin: Secondary | ICD-10-CM | POA: Diagnosis not present

## 2016-04-30 DIAGNOSIS — I480 Paroxysmal atrial fibrillation: Secondary | ICD-10-CM

## 2016-04-30 DIAGNOSIS — E1129 Type 2 diabetes mellitus with other diabetic kidney complication: Secondary | ICD-10-CM | POA: Diagnosis not present

## 2016-05-01 DIAGNOSIS — Z96651 Presence of right artificial knee joint: Secondary | ICD-10-CM | POA: Diagnosis not present

## 2016-05-01 DIAGNOSIS — M25561 Pain in right knee: Secondary | ICD-10-CM | POA: Diagnosis not present

## 2016-05-01 DIAGNOSIS — M25661 Stiffness of right knee, not elsewhere classified: Secondary | ICD-10-CM | POA: Diagnosis not present

## 2016-05-01 DIAGNOSIS — R262 Difficulty in walking, not elsewhere classified: Secondary | ICD-10-CM | POA: Diagnosis not present

## 2016-05-02 DIAGNOSIS — Z992 Dependence on renal dialysis: Secondary | ICD-10-CM | POA: Diagnosis not present

## 2016-05-02 DIAGNOSIS — E1129 Type 2 diabetes mellitus with other diabetic kidney complication: Secondary | ICD-10-CM | POA: Diagnosis not present

## 2016-05-02 DIAGNOSIS — D631 Anemia in chronic kidney disease: Secondary | ICD-10-CM | POA: Diagnosis not present

## 2016-05-02 DIAGNOSIS — N186 End stage renal disease: Secondary | ICD-10-CM | POA: Diagnosis not present

## 2016-05-02 DIAGNOSIS — N2581 Secondary hyperparathyroidism of renal origin: Secondary | ICD-10-CM | POA: Diagnosis not present

## 2016-05-03 DIAGNOSIS — Z96651 Presence of right artificial knee joint: Secondary | ICD-10-CM | POA: Diagnosis not present

## 2016-05-03 DIAGNOSIS — G6289 Other specified polyneuropathies: Secondary | ICD-10-CM | POA: Diagnosis not present

## 2016-05-03 DIAGNOSIS — R7303 Prediabetes: Secondary | ICD-10-CM | POA: Diagnosis not present

## 2016-05-03 DIAGNOSIS — M25561 Pain in right knee: Secondary | ICD-10-CM | POA: Diagnosis not present

## 2016-05-03 DIAGNOSIS — M25661 Stiffness of right knee, not elsewhere classified: Secondary | ICD-10-CM | POA: Diagnosis not present

## 2016-05-03 DIAGNOSIS — R262 Difficulty in walking, not elsewhere classified: Secondary | ICD-10-CM | POA: Diagnosis not present

## 2016-05-03 DIAGNOSIS — I5020 Unspecified systolic (congestive) heart failure: Secondary | ICD-10-CM | POA: Diagnosis not present

## 2016-05-03 DIAGNOSIS — N186 End stage renal disease: Secondary | ICD-10-CM | POA: Diagnosis not present

## 2016-05-03 DIAGNOSIS — I10 Essential (primary) hypertension: Secondary | ICD-10-CM | POA: Diagnosis not present

## 2016-05-03 DIAGNOSIS — I482 Chronic atrial fibrillation: Secondary | ICD-10-CM | POA: Diagnosis not present

## 2016-05-04 DIAGNOSIS — Z992 Dependence on renal dialysis: Secondary | ICD-10-CM | POA: Diagnosis not present

## 2016-05-04 DIAGNOSIS — N186 End stage renal disease: Secondary | ICD-10-CM | POA: Diagnosis not present

## 2016-05-04 DIAGNOSIS — D631 Anemia in chronic kidney disease: Secondary | ICD-10-CM | POA: Diagnosis not present

## 2016-05-04 DIAGNOSIS — N2581 Secondary hyperparathyroidism of renal origin: Secondary | ICD-10-CM | POA: Diagnosis not present

## 2016-05-04 DIAGNOSIS — E1129 Type 2 diabetes mellitus with other diabetic kidney complication: Secondary | ICD-10-CM | POA: Diagnosis not present

## 2016-05-07 DIAGNOSIS — Z992 Dependence on renal dialysis: Secondary | ICD-10-CM | POA: Diagnosis not present

## 2016-05-07 DIAGNOSIS — N2581 Secondary hyperparathyroidism of renal origin: Secondary | ICD-10-CM | POA: Diagnosis not present

## 2016-05-07 DIAGNOSIS — E1129 Type 2 diabetes mellitus with other diabetic kidney complication: Secondary | ICD-10-CM | POA: Diagnosis not present

## 2016-05-07 DIAGNOSIS — N186 End stage renal disease: Secondary | ICD-10-CM | POA: Diagnosis not present

## 2016-05-07 DIAGNOSIS — D631 Anemia in chronic kidney disease: Secondary | ICD-10-CM | POA: Diagnosis not present

## 2016-05-08 DIAGNOSIS — M1712 Unilateral primary osteoarthritis, left knee: Secondary | ICD-10-CM | POA: Diagnosis not present

## 2016-05-08 DIAGNOSIS — Z96651 Presence of right artificial knee joint: Secondary | ICD-10-CM | POA: Diagnosis not present

## 2016-05-08 DIAGNOSIS — M25561 Pain in right knee: Secondary | ICD-10-CM | POA: Diagnosis not present

## 2016-05-08 DIAGNOSIS — M25661 Stiffness of right knee, not elsewhere classified: Secondary | ICD-10-CM | POA: Diagnosis not present

## 2016-05-08 DIAGNOSIS — R262 Difficulty in walking, not elsewhere classified: Secondary | ICD-10-CM | POA: Diagnosis not present

## 2016-05-08 DIAGNOSIS — M19011 Primary osteoarthritis, right shoulder: Secondary | ICD-10-CM | POA: Diagnosis not present

## 2016-05-09 DIAGNOSIS — N2581 Secondary hyperparathyroidism of renal origin: Secondary | ICD-10-CM | POA: Diagnosis not present

## 2016-05-09 DIAGNOSIS — I4891 Unspecified atrial fibrillation: Secondary | ICD-10-CM | POA: Diagnosis not present

## 2016-05-09 DIAGNOSIS — D631 Anemia in chronic kidney disease: Secondary | ICD-10-CM | POA: Diagnosis not present

## 2016-05-09 DIAGNOSIS — Z992 Dependence on renal dialysis: Secondary | ICD-10-CM | POA: Diagnosis not present

## 2016-05-09 DIAGNOSIS — N186 End stage renal disease: Secondary | ICD-10-CM | POA: Diagnosis not present

## 2016-05-09 DIAGNOSIS — E1129 Type 2 diabetes mellitus with other diabetic kidney complication: Secondary | ICD-10-CM | POA: Diagnosis not present

## 2016-05-09 LAB — PROTIME-INR: INR: 2.4 — AB (ref 0.9–1.1)

## 2016-05-10 ENCOUNTER — Ambulatory Visit (INDEPENDENT_AMBULATORY_CARE_PROVIDER_SITE_OTHER): Payer: Medicare Other | Admitting: Pharmacist Clinician (PhC)/ Clinical Pharmacy Specialist

## 2016-05-10 DIAGNOSIS — M25561 Pain in right knee: Secondary | ICD-10-CM | POA: Diagnosis not present

## 2016-05-10 DIAGNOSIS — R262 Difficulty in walking, not elsewhere classified: Secondary | ICD-10-CM | POA: Diagnosis not present

## 2016-05-10 DIAGNOSIS — Z96651 Presence of right artificial knee joint: Secondary | ICD-10-CM | POA: Diagnosis not present

## 2016-05-10 DIAGNOSIS — I480 Paroxysmal atrial fibrillation: Secondary | ICD-10-CM

## 2016-05-10 DIAGNOSIS — M25661 Stiffness of right knee, not elsewhere classified: Secondary | ICD-10-CM | POA: Diagnosis not present

## 2016-05-11 DIAGNOSIS — N2581 Secondary hyperparathyroidism of renal origin: Secondary | ICD-10-CM | POA: Diagnosis not present

## 2016-05-11 DIAGNOSIS — D631 Anemia in chronic kidney disease: Secondary | ICD-10-CM | POA: Diagnosis not present

## 2016-05-11 DIAGNOSIS — Z992 Dependence on renal dialysis: Secondary | ICD-10-CM | POA: Diagnosis not present

## 2016-05-11 DIAGNOSIS — E1129 Type 2 diabetes mellitus with other diabetic kidney complication: Secondary | ICD-10-CM | POA: Diagnosis not present

## 2016-05-11 DIAGNOSIS — N186 End stage renal disease: Secondary | ICD-10-CM | POA: Diagnosis not present

## 2016-05-12 DIAGNOSIS — N186 End stage renal disease: Secondary | ICD-10-CM | POA: Diagnosis not present

## 2016-05-12 DIAGNOSIS — Z992 Dependence on renal dialysis: Secondary | ICD-10-CM | POA: Diagnosis not present

## 2016-05-12 DIAGNOSIS — I120 Hypertensive chronic kidney disease with stage 5 chronic kidney disease or end stage renal disease: Secondary | ICD-10-CM | POA: Diagnosis not present

## 2016-05-14 DIAGNOSIS — N2581 Secondary hyperparathyroidism of renal origin: Secondary | ICD-10-CM | POA: Diagnosis not present

## 2016-05-14 DIAGNOSIS — D631 Anemia in chronic kidney disease: Secondary | ICD-10-CM | POA: Diagnosis not present

## 2016-05-14 DIAGNOSIS — E1129 Type 2 diabetes mellitus with other diabetic kidney complication: Secondary | ICD-10-CM | POA: Diagnosis not present

## 2016-05-14 DIAGNOSIS — Z992 Dependence on renal dialysis: Secondary | ICD-10-CM | POA: Diagnosis not present

## 2016-05-14 DIAGNOSIS — N186 End stage renal disease: Secondary | ICD-10-CM | POA: Diagnosis not present

## 2016-05-15 DIAGNOSIS — M25561 Pain in right knee: Secondary | ICD-10-CM | POA: Diagnosis not present

## 2016-05-15 DIAGNOSIS — M25661 Stiffness of right knee, not elsewhere classified: Secondary | ICD-10-CM | POA: Diagnosis not present

## 2016-05-15 DIAGNOSIS — R262 Difficulty in walking, not elsewhere classified: Secondary | ICD-10-CM | POA: Diagnosis not present

## 2016-05-15 DIAGNOSIS — Z96651 Presence of right artificial knee joint: Secondary | ICD-10-CM | POA: Diagnosis not present

## 2016-05-16 DIAGNOSIS — Z992 Dependence on renal dialysis: Secondary | ICD-10-CM | POA: Diagnosis not present

## 2016-05-16 DIAGNOSIS — E1129 Type 2 diabetes mellitus with other diabetic kidney complication: Secondary | ICD-10-CM | POA: Diagnosis not present

## 2016-05-16 DIAGNOSIS — D631 Anemia in chronic kidney disease: Secondary | ICD-10-CM | POA: Diagnosis not present

## 2016-05-16 DIAGNOSIS — N186 End stage renal disease: Secondary | ICD-10-CM | POA: Diagnosis not present

## 2016-05-16 DIAGNOSIS — N2581 Secondary hyperparathyroidism of renal origin: Secondary | ICD-10-CM | POA: Diagnosis not present

## 2016-05-17 DIAGNOSIS — M25661 Stiffness of right knee, not elsewhere classified: Secondary | ICD-10-CM | POA: Diagnosis not present

## 2016-05-17 DIAGNOSIS — M25561 Pain in right knee: Secondary | ICD-10-CM | POA: Diagnosis not present

## 2016-05-17 DIAGNOSIS — R262 Difficulty in walking, not elsewhere classified: Secondary | ICD-10-CM | POA: Diagnosis not present

## 2016-05-17 DIAGNOSIS — Z96651 Presence of right artificial knee joint: Secondary | ICD-10-CM | POA: Diagnosis not present

## 2016-05-18 DIAGNOSIS — D631 Anemia in chronic kidney disease: Secondary | ICD-10-CM | POA: Diagnosis not present

## 2016-05-18 DIAGNOSIS — N186 End stage renal disease: Secondary | ICD-10-CM | POA: Diagnosis not present

## 2016-05-18 DIAGNOSIS — N2581 Secondary hyperparathyroidism of renal origin: Secondary | ICD-10-CM | POA: Diagnosis not present

## 2016-05-18 DIAGNOSIS — Z992 Dependence on renal dialysis: Secondary | ICD-10-CM | POA: Diagnosis not present

## 2016-05-18 DIAGNOSIS — E1129 Type 2 diabetes mellitus with other diabetic kidney complication: Secondary | ICD-10-CM | POA: Diagnosis not present

## 2016-05-21 DIAGNOSIS — Z992 Dependence on renal dialysis: Secondary | ICD-10-CM | POA: Diagnosis not present

## 2016-05-21 DIAGNOSIS — E1129 Type 2 diabetes mellitus with other diabetic kidney complication: Secondary | ICD-10-CM | POA: Diagnosis not present

## 2016-05-21 DIAGNOSIS — N186 End stage renal disease: Secondary | ICD-10-CM | POA: Diagnosis not present

## 2016-05-21 DIAGNOSIS — D631 Anemia in chronic kidney disease: Secondary | ICD-10-CM | POA: Diagnosis not present

## 2016-05-21 DIAGNOSIS — N2581 Secondary hyperparathyroidism of renal origin: Secondary | ICD-10-CM | POA: Diagnosis not present

## 2016-05-23 ENCOUNTER — Telehealth: Payer: Self-pay | Admitting: Internal Medicine

## 2016-05-23 NOTE — Telephone Encounter (Signed)
Per office protocol will need to bridge with enoxaparin for upcoming knee replacement surgery.  Letter written to Dr. Berenice Primas.  Patient has appt with Dr. Debara Pickett on May 4, will do INR check and lovenox teaching at that time.

## 2016-05-23 NOTE — Telephone Encounter (Signed)
[place] requests clearance for: 1. Type of surgery: left total knee replacement 2. Date of surgery: TBD 3. Surgeon: Dr. Dorna Leitz 4. Medications that need to be held & how long: warfarin (per note, was previously bridged per orthopaedic note) 5. Fax and/or Phone: (p) [phone]  [phone]  Last cardiologist appointment June 2017 Scheduled to see Dr. Debara Pickett 06/14/2016

## 2016-05-24 DIAGNOSIS — Z96651 Presence of right artificial knee joint: Secondary | ICD-10-CM | POA: Diagnosis not present

## 2016-05-24 DIAGNOSIS — R262 Difficulty in walking, not elsewhere classified: Secondary | ICD-10-CM | POA: Diagnosis not present

## 2016-05-24 DIAGNOSIS — M25561 Pain in right knee: Secondary | ICD-10-CM | POA: Diagnosis not present

## 2016-05-24 DIAGNOSIS — M25661 Stiffness of right knee, not elsewhere classified: Secondary | ICD-10-CM | POA: Diagnosis not present

## 2016-05-24 NOTE — Telephone Encounter (Signed)
Thanks Kristin  -Mali

## 2016-05-25 DIAGNOSIS — E1129 Type 2 diabetes mellitus with other diabetic kidney complication: Secondary | ICD-10-CM | POA: Diagnosis not present

## 2016-05-25 DIAGNOSIS — N2581 Secondary hyperparathyroidism of renal origin: Secondary | ICD-10-CM | POA: Diagnosis not present

## 2016-05-25 DIAGNOSIS — D631 Anemia in chronic kidney disease: Secondary | ICD-10-CM | POA: Diagnosis not present

## 2016-05-25 DIAGNOSIS — N186 End stage renal disease: Secondary | ICD-10-CM | POA: Diagnosis not present

## 2016-05-25 DIAGNOSIS — Z992 Dependence on renal dialysis: Secondary | ICD-10-CM | POA: Diagnosis not present

## 2016-05-28 DIAGNOSIS — Z992 Dependence on renal dialysis: Secondary | ICD-10-CM | POA: Diagnosis not present

## 2016-05-28 DIAGNOSIS — D631 Anemia in chronic kidney disease: Secondary | ICD-10-CM | POA: Diagnosis not present

## 2016-05-28 DIAGNOSIS — N2581 Secondary hyperparathyroidism of renal origin: Secondary | ICD-10-CM | POA: Diagnosis not present

## 2016-05-28 DIAGNOSIS — E1129 Type 2 diabetes mellitus with other diabetic kidney complication: Secondary | ICD-10-CM | POA: Diagnosis not present

## 2016-05-28 DIAGNOSIS — N186 End stage renal disease: Secondary | ICD-10-CM | POA: Diagnosis not present

## 2016-05-28 IMAGING — CR DG HIP COMPLETE 2+V*R*
3 series · 3 of 3 positions shown · non-contrast
Comparison: None.

CLINICAL DATA: Fall.  Right hip pain.

EXAM:
RIGHT HIP - COMPLETE 2+ VIEW

[t hip ap right]
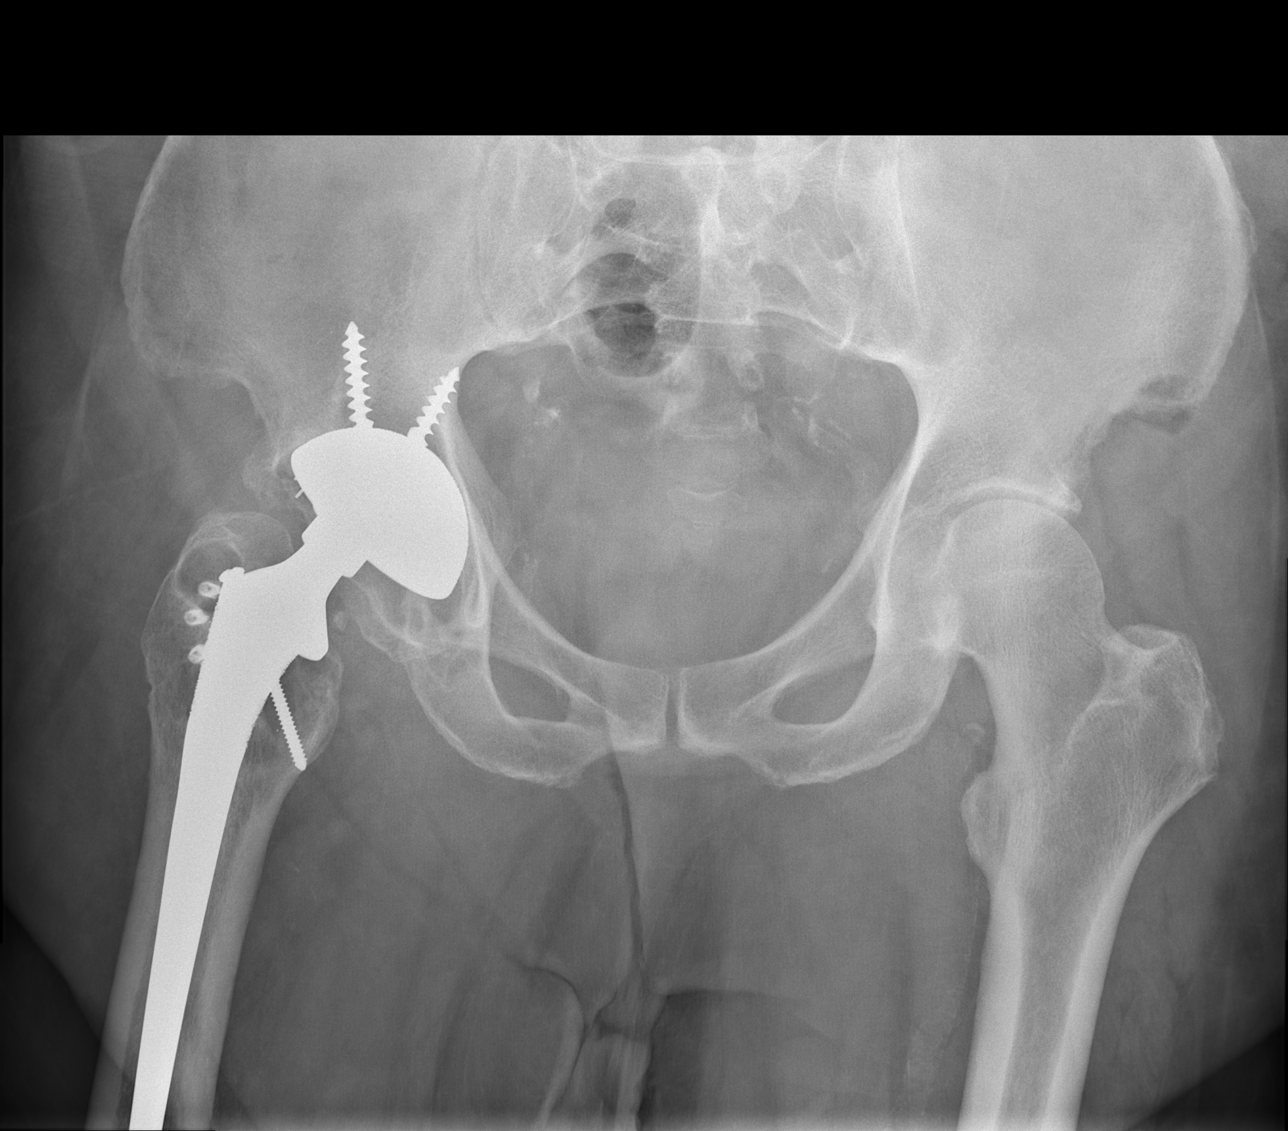

[t hip frog leg right (1 of 2)]
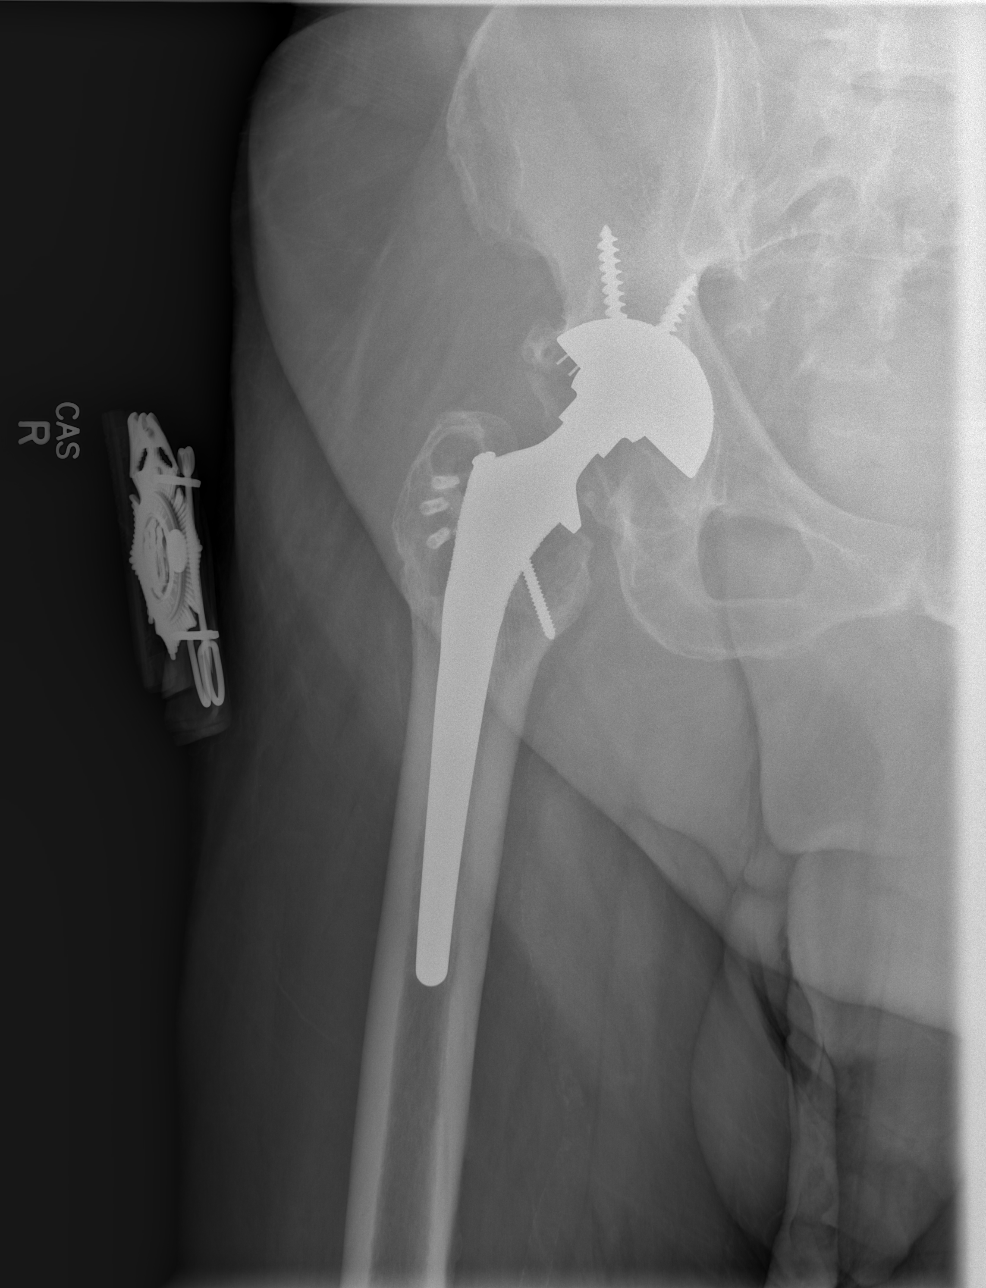

[t hip frog leg right (2 of 2)]
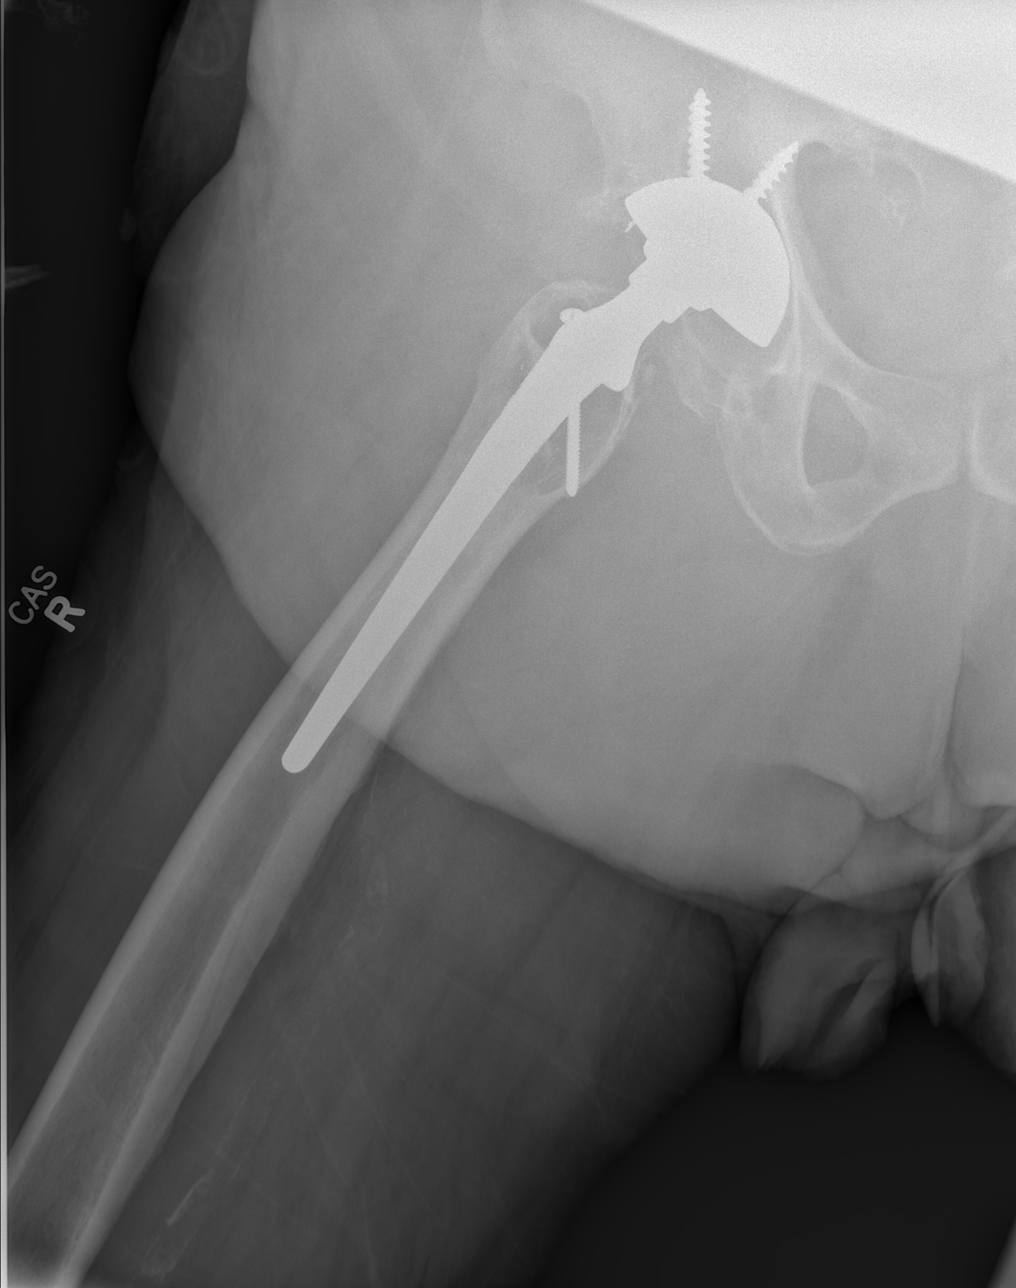

[3 of 3 positions shown; findings below may reference images not displayed]

FINDINGS: Right total hip prosthesis with femoral head component more
superior-lateral than expected, but not dislocated. Accordingly this
probably represents wear of the polymer liner of the shell. There is
some mild lucency along one of the fixation screws of the acetabular
shell, along with several anchors and a fixation screw along the
proximal stem and greater trochanter on the right. There is some
lucency in the right proximal femoral Bone within oval-shaped
appearance, significance uncertain.

Vascular calcifications noted. Spurring noted along the right
acetabular margin.
IMPRESSION: 1. Asymmetry of alignment between the femoral head component and
acetabular shell probably related to polymer wear. No dislocation is
identified.
2. Bony spurring/irregularity of the acetabulum, likely chronic.
3. Anchors are present along the greater trochanter, and there is
some lucency in the proximal femur extending between the greater and
lesser trochanters. Significance of this lucency is on certain but
it appears chronic and possibly related to particulate disease.

## 2016-05-29 DIAGNOSIS — M25561 Pain in right knee: Secondary | ICD-10-CM | POA: Diagnosis not present

## 2016-05-29 DIAGNOSIS — Z96651 Presence of right artificial knee joint: Secondary | ICD-10-CM | POA: Diagnosis not present

## 2016-05-29 DIAGNOSIS — M25661 Stiffness of right knee, not elsewhere classified: Secondary | ICD-10-CM | POA: Diagnosis not present

## 2016-05-29 DIAGNOSIS — R262 Difficulty in walking, not elsewhere classified: Secondary | ICD-10-CM | POA: Diagnosis not present

## 2016-05-30 ENCOUNTER — Other Ambulatory Visit: Payer: Self-pay | Admitting: Orthopedic Surgery

## 2016-05-30 DIAGNOSIS — D631 Anemia in chronic kidney disease: Secondary | ICD-10-CM | POA: Diagnosis not present

## 2016-05-30 DIAGNOSIS — Z992 Dependence on renal dialysis: Secondary | ICD-10-CM | POA: Diagnosis not present

## 2016-05-30 DIAGNOSIS — N2581 Secondary hyperparathyroidism of renal origin: Secondary | ICD-10-CM | POA: Diagnosis not present

## 2016-05-30 DIAGNOSIS — N186 End stage renal disease: Secondary | ICD-10-CM | POA: Diagnosis not present

## 2016-05-30 DIAGNOSIS — E1129 Type 2 diabetes mellitus with other diabetic kidney complication: Secondary | ICD-10-CM | POA: Diagnosis not present

## 2016-05-31 DIAGNOSIS — R262 Difficulty in walking, not elsewhere classified: Secondary | ICD-10-CM | POA: Diagnosis not present

## 2016-05-31 DIAGNOSIS — M25561 Pain in right knee: Secondary | ICD-10-CM | POA: Diagnosis not present

## 2016-05-31 DIAGNOSIS — M25661 Stiffness of right knee, not elsewhere classified: Secondary | ICD-10-CM | POA: Diagnosis not present

## 2016-05-31 DIAGNOSIS — Z96651 Presence of right artificial knee joint: Secondary | ICD-10-CM | POA: Diagnosis not present

## 2016-06-01 DIAGNOSIS — N186 End stage renal disease: Secondary | ICD-10-CM | POA: Diagnosis not present

## 2016-06-01 DIAGNOSIS — E1143 Type 2 diabetes mellitus with diabetic autonomic (poly)neuropathy: Secondary | ICD-10-CM | POA: Diagnosis not present

## 2016-06-01 DIAGNOSIS — I5020 Unspecified systolic (congestive) heart failure: Secondary | ICD-10-CM | POA: Diagnosis not present

## 2016-06-01 DIAGNOSIS — Z01818 Encounter for other preprocedural examination: Secondary | ICD-10-CM | POA: Diagnosis not present

## 2016-06-01 DIAGNOSIS — I10 Essential (primary) hypertension: Secondary | ICD-10-CM | POA: Diagnosis not present

## 2016-06-01 DIAGNOSIS — D631 Anemia in chronic kidney disease: Secondary | ICD-10-CM | POA: Diagnosis not present

## 2016-06-01 DIAGNOSIS — M1712 Unilateral primary osteoarthritis, left knee: Secondary | ICD-10-CM | POA: Diagnosis not present

## 2016-06-01 DIAGNOSIS — N2581 Secondary hyperparathyroidism of renal origin: Secondary | ICD-10-CM | POA: Diagnosis not present

## 2016-06-01 DIAGNOSIS — E1129 Type 2 diabetes mellitus with other diabetic kidney complication: Secondary | ICD-10-CM | POA: Diagnosis not present

## 2016-06-01 DIAGNOSIS — Z992 Dependence on renal dialysis: Secondary | ICD-10-CM | POA: Diagnosis not present

## 2016-06-01 DIAGNOSIS — I482 Chronic atrial fibrillation: Secondary | ICD-10-CM | POA: Diagnosis not present

## 2016-06-04 DIAGNOSIS — N186 End stage renal disease: Secondary | ICD-10-CM | POA: Diagnosis not present

## 2016-06-04 DIAGNOSIS — N2581 Secondary hyperparathyroidism of renal origin: Secondary | ICD-10-CM | POA: Diagnosis not present

## 2016-06-04 DIAGNOSIS — Z992 Dependence on renal dialysis: Secondary | ICD-10-CM | POA: Diagnosis not present

## 2016-06-04 DIAGNOSIS — E1129 Type 2 diabetes mellitus with other diabetic kidney complication: Secondary | ICD-10-CM | POA: Diagnosis not present

## 2016-06-04 DIAGNOSIS — D631 Anemia in chronic kidney disease: Secondary | ICD-10-CM | POA: Diagnosis not present

## 2016-06-05 DIAGNOSIS — M25561 Pain in right knee: Secondary | ICD-10-CM | POA: Diagnosis not present

## 2016-06-05 DIAGNOSIS — R262 Difficulty in walking, not elsewhere classified: Secondary | ICD-10-CM | POA: Diagnosis not present

## 2016-06-05 DIAGNOSIS — Z96651 Presence of right artificial knee joint: Secondary | ICD-10-CM | POA: Diagnosis not present

## 2016-06-05 DIAGNOSIS — M25661 Stiffness of right knee, not elsewhere classified: Secondary | ICD-10-CM | POA: Diagnosis not present

## 2016-06-06 DIAGNOSIS — N2581 Secondary hyperparathyroidism of renal origin: Secondary | ICD-10-CM | POA: Diagnosis not present

## 2016-06-06 DIAGNOSIS — I4891 Unspecified atrial fibrillation: Secondary | ICD-10-CM | POA: Diagnosis not present

## 2016-06-06 DIAGNOSIS — E1129 Type 2 diabetes mellitus with other diabetic kidney complication: Secondary | ICD-10-CM | POA: Diagnosis not present

## 2016-06-06 DIAGNOSIS — D631 Anemia in chronic kidney disease: Secondary | ICD-10-CM | POA: Diagnosis not present

## 2016-06-06 DIAGNOSIS — Z992 Dependence on renal dialysis: Secondary | ICD-10-CM | POA: Diagnosis not present

## 2016-06-06 DIAGNOSIS — N186 End stage renal disease: Secondary | ICD-10-CM | POA: Diagnosis not present

## 2016-06-06 LAB — PROTIME-INR: INR: 2.2 — AB (ref 0.9–1.1)

## 2016-06-07 DIAGNOSIS — T82858A Stenosis of vascular prosthetic devices, implants and grafts, initial encounter: Secondary | ICD-10-CM | POA: Diagnosis not present

## 2016-06-07 DIAGNOSIS — M25561 Pain in right knee: Secondary | ICD-10-CM | POA: Diagnosis not present

## 2016-06-07 DIAGNOSIS — R262 Difficulty in walking, not elsewhere classified: Secondary | ICD-10-CM | POA: Diagnosis not present

## 2016-06-07 DIAGNOSIS — N186 End stage renal disease: Secondary | ICD-10-CM | POA: Diagnosis not present

## 2016-06-07 DIAGNOSIS — Z96651 Presence of right artificial knee joint: Secondary | ICD-10-CM | POA: Diagnosis not present

## 2016-06-07 DIAGNOSIS — I871 Compression of vein: Secondary | ICD-10-CM | POA: Diagnosis not present

## 2016-06-07 DIAGNOSIS — Z992 Dependence on renal dialysis: Secondary | ICD-10-CM | POA: Diagnosis not present

## 2016-06-07 DIAGNOSIS — M25661 Stiffness of right knee, not elsewhere classified: Secondary | ICD-10-CM | POA: Diagnosis not present

## 2016-06-08 ENCOUNTER — Ambulatory Visit (INDEPENDENT_AMBULATORY_CARE_PROVIDER_SITE_OTHER): Payer: Medicare Other | Admitting: Pharmacist

## 2016-06-08 DIAGNOSIS — D631 Anemia in chronic kidney disease: Secondary | ICD-10-CM | POA: Diagnosis not present

## 2016-06-08 DIAGNOSIS — I480 Paroxysmal atrial fibrillation: Secondary | ICD-10-CM

## 2016-06-08 DIAGNOSIS — N2581 Secondary hyperparathyroidism of renal origin: Secondary | ICD-10-CM | POA: Diagnosis not present

## 2016-06-08 DIAGNOSIS — E1129 Type 2 diabetes mellitus with other diabetic kidney complication: Secondary | ICD-10-CM | POA: Diagnosis not present

## 2016-06-08 DIAGNOSIS — Z7901 Long term (current) use of anticoagulants: Secondary | ICD-10-CM

## 2016-06-08 DIAGNOSIS — N186 End stage renal disease: Secondary | ICD-10-CM | POA: Diagnosis not present

## 2016-06-08 DIAGNOSIS — Z992 Dependence on renal dialysis: Secondary | ICD-10-CM | POA: Diagnosis not present

## 2016-06-11 DIAGNOSIS — E1129 Type 2 diabetes mellitus with other diabetic kidney complication: Secondary | ICD-10-CM | POA: Diagnosis not present

## 2016-06-11 DIAGNOSIS — Z992 Dependence on renal dialysis: Secondary | ICD-10-CM | POA: Diagnosis not present

## 2016-06-11 DIAGNOSIS — N186 End stage renal disease: Secondary | ICD-10-CM | POA: Diagnosis not present

## 2016-06-11 DIAGNOSIS — D631 Anemia in chronic kidney disease: Secondary | ICD-10-CM | POA: Diagnosis not present

## 2016-06-11 DIAGNOSIS — N2581 Secondary hyperparathyroidism of renal origin: Secondary | ICD-10-CM | POA: Diagnosis not present

## 2016-06-11 DIAGNOSIS — I120 Hypertensive chronic kidney disease with stage 5 chronic kidney disease or end stage renal disease: Secondary | ICD-10-CM | POA: Diagnosis not present

## 2016-06-12 ENCOUNTER — Telehealth: Payer: Self-pay

## 2016-06-12 NOTE — Telephone Encounter (Signed)
1st attempt to contact the patient and confirm PCP visits since leaving [place]. Daniel Whitney ([facility])

## 2016-06-13 DIAGNOSIS — Z992 Dependence on renal dialysis: Secondary | ICD-10-CM | POA: Diagnosis not present

## 2016-06-13 DIAGNOSIS — D631 Anemia in chronic kidney disease: Secondary | ICD-10-CM | POA: Diagnosis not present

## 2016-06-13 DIAGNOSIS — N186 End stage renal disease: Secondary | ICD-10-CM | POA: Diagnosis not present

## 2016-06-13 DIAGNOSIS — E1129 Type 2 diabetes mellitus with other diabetic kidney complication: Secondary | ICD-10-CM | POA: Diagnosis not present

## 2016-06-13 DIAGNOSIS — N2581 Secondary hyperparathyroidism of renal origin: Secondary | ICD-10-CM | POA: Diagnosis not present

## 2016-06-14 ENCOUNTER — Encounter: Payer: Self-pay | Admitting: Internal Medicine

## 2016-06-14 ENCOUNTER — Ambulatory Visit (INDEPENDENT_AMBULATORY_CARE_PROVIDER_SITE_OTHER): Payer: Medicare Other | Admitting: Internal Medicine

## 2016-06-14 ENCOUNTER — Ambulatory Visit (INDEPENDENT_AMBULATORY_CARE_PROVIDER_SITE_OTHER): Payer: Medicare Other | Admitting: Pharmacist

## 2016-06-14 ENCOUNTER — Other Ambulatory Visit: Payer: Self-pay | Admitting: Orthopedic Surgery

## 2016-06-14 VITALS — BP 98/70 | HR 79 | Ht 69.0 in | Wt 250.0 lb

## 2016-06-14 DIAGNOSIS — Z01810 Encounter for preprocedural cardiovascular examination: Secondary | ICD-10-CM | POA: Diagnosis not present

## 2016-06-14 DIAGNOSIS — I10 Essential (primary) hypertension: Secondary | ICD-10-CM

## 2016-06-14 DIAGNOSIS — M25661 Stiffness of right knee, not elsewhere classified: Secondary | ICD-10-CM | POA: Diagnosis not present

## 2016-06-14 DIAGNOSIS — Z992 Dependence on renal dialysis: Secondary | ICD-10-CM

## 2016-06-14 DIAGNOSIS — I480 Paroxysmal atrial fibrillation: Secondary | ICD-10-CM

## 2016-06-14 DIAGNOSIS — Z96651 Presence of right artificial knee joint: Secondary | ICD-10-CM | POA: Diagnosis not present

## 2016-06-14 DIAGNOSIS — M25561 Pain in right knee: Secondary | ICD-10-CM | POA: Diagnosis not present

## 2016-06-14 DIAGNOSIS — N186 End stage renal disease: Secondary | ICD-10-CM | POA: Diagnosis not present

## 2016-06-14 DIAGNOSIS — Z7901 Long term (current) use of anticoagulants: Secondary | ICD-10-CM | POA: Diagnosis not present

## 2016-06-14 DIAGNOSIS — R262 Difficulty in walking, not elsewhere classified: Secondary | ICD-10-CM | POA: Diagnosis not present

## 2016-06-14 LAB — POCT INR: INR: 2.2

## 2016-06-14 MED ORDER — ENOXAPARIN SODIUM 100 MG/ML ~~LOC~~ SOLN: 100.0000 mg | SUBCUTANEOUS | 0 refills | Status: DC

## 2016-06-14 NOTE — Progress Notes (Signed)
  May/9/18: Last dose of warfarin.  May/10/18: No warfarin or Lovenox.  May/11/18: Inject Lovenox 100mg  in the fatty abdominal tissue at least 2 inches from the belly button daily in the morning. No warfarin.  May/12/18: Inject Lovenox in the fatty tissue every morning. No warfarin.  May/13/18: Inject Lovenox in the fatty tissue every morning. No warfarin.  May/14/18: Inject Lovenox in the fatty tissue in the morning. No warfarin.  May/15/18: Procedure Day - No Lovenox - Resume warfarin in the evening or as directed by doctor .  May/16/18: Resume Lovenox in the fatty tissue in the morning and take warfarin 4.5mg . (or as directed by rehab facility)  May/17/18: Inject Lovenox in the fatty tissue in the morning and take warfarin 4.5mg .  May/18/18: Inject Lovenox in the fatty tissue every 12 hours and take warfarin 4.5mg .   May/19/18: Inject Lovenox in the fatty tissue every 12 hours and take warfarin 3mg .  May/20/18: Inject Lovenox in the fatty tissue every 12 hours and take warfarin 3mg .  May/21/18: warfarin appt to check INR

## 2016-06-14 NOTE — Patient Instructions (Signed)
Your physician wants you to follow-up in: ONE YEAR with Dr. Hilty. You will receive a reminder letter in the mail two months in advance. If you don't receive a letter, please call our office to schedule the follow-up appointment.  

## 2016-06-14 NOTE — Patient Instructions (Signed)
May/9/18: Last dose of warfarin.  May/10/18: No warfarin or Lovenox.  May/11/18: Inject Lovenox 100mg  in the fatty abdominal tissue at least 2 inches from the belly button daily in the morning. No warfarin.  May/12/18: Inject Lovenox in the fatty tissue every morning. No warfarin.  May/13/18: Inject Lovenox in the fatty tissue every morning. No warfarin.  May/14/18: Inject Lovenox in the fatty tissue in the morning. No warfarin.  May/15/18: Procedure Day - No Lovenox - Resume warfarin in the evening or as directed by doctor .  May/16/18: Resume Lovenox in the fatty tissue in the morning and take warfarin 4.5mg . (ore as directed by rehab facility)  May/17/18: Inject Lovenox in the fatty tissue in the morning and take warfarin 4.5mg .  May/18/18: Inject Lovenox in the fatty tissue every 12 hours and take warfarin 4.5mg .   May/19/18: Inject Lovenox in the fatty tissue every 12 hours and take warfarin 3mg .  May/20/18: Inject Lovenox in the fatty tissue every 12 hours and take warfarin 3mg .  May/21/18: warfarin appt to check INR

## 2016-06-14 NOTE — Telephone Encounter (Signed)
2nd attempt to contact the patient. I left another voicemail message. Duane Lope ([place])

## 2016-06-15 DIAGNOSIS — Z992 Dependence on renal dialysis: Secondary | ICD-10-CM | POA: Diagnosis not present

## 2016-06-15 DIAGNOSIS — N2581 Secondary hyperparathyroidism of renal origin: Secondary | ICD-10-CM | POA: Diagnosis not present

## 2016-06-15 DIAGNOSIS — D631 Anemia in chronic kidney disease: Secondary | ICD-10-CM | POA: Diagnosis not present

## 2016-06-15 DIAGNOSIS — E1129 Type 2 diabetes mellitus with other diabetic kidney complication: Secondary | ICD-10-CM | POA: Diagnosis not present

## 2016-06-15 DIAGNOSIS — N186 End stage renal disease: Secondary | ICD-10-CM | POA: Diagnosis not present

## 2016-06-15 NOTE — Progress Notes (Signed)
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 He was last assigned to work in the [place]. He does have family members in [place] and after retirement moved to this area to be closer to his family. Unfortunately, he has a significant past medical history including heart failure, with a cardiac catheterization in the past that apparently showed some coronary artery disease, but he denies ever having an MI or prior stenting. He also has a history of hypertension, dyslipidemia, end-stage renal disease and has been on dialysis for 2 years, and atrial fibrillation on warfarin. He has had a prior stroke and has diabetes, but is not on insulin. He is also morbidly obese. His Coumadin levels are now being followed by his primary care provider Dr. Jenny Reichmann, and he reports his last INR was 2.1.  With regards to atrial fibrillation he says these had it for approximately 2-3 years.  He initially underwent a TEE cardioversion which converted him to sinus for short period of time and then ultimately went back into atrial fibrillation. I believe he was started on amiodarone at this time and underwent a subsequent cardioversion which was not successful.  He has been maintained on amiodarone, but believes that he has been in atrial fibrillation for some time. He reported to me that he may have felt a little more energy when he was in sinus rhythm but it is difficult for him to tell. He is currently denying any chest pain or worsening shortness of breath. He reports his weight varies somewhat from week to week and is generally managed at dialysis. He is aneuric. The degree of his cardiomyopathy is unknown at this time and we will request records from his prior cardiologist (Dr. Royanne Foots) and electrophysiologist (Dr. Leonia Reeves) in the  [facility].  07/28/2015  Mr. Lyford returns today for follow-up. I last saw him in the office as a new patient in 2015. Subsequently he presented in May 2016 after missing 2 dialysis sessions with hyperkalemia and was noted to present in cardiac arrest. He was successfully resuscitated. He again presented with PA arrest in November 2016 and was hyperkalemic with a potassium of 7. At the time he was noted to have an EF of 20% however cardiac catheterization revealed mild distal RCA disease but no obstructive coronary disease. 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 Based on presentation and improvement in LV function he was not a candidate for AICD. He was placed on amiodarone for rate control and this is been weaned down to 200 mg daily. He also is on carvedilol but says it is associated with hypotension at dialysis. It seems that he has intermittent atrial fibrillation or flutter and there are clearly documented periods of sinus rhythm but he is asymptomatic with this. He is on warfarin for anticoagulation. He recently was dismissed by his primary care provider (Dr. Jenny Reichmann) but reestablished primary care with Dr. Dorthy Cooler in the [facility] practice. At this time, is not clear who is following his INRs however he says that he has had them drawn at dialysis in the past. We will try to arrange to follow those in our office.  06/14/2016  Mr. Janoski returns today for follow-up. He seen in preoperative evaluation for upcoming knee surgery. In December he underwent right knee replacement and  required bridging with Lovenox due to his high CHADSVASC score. Surgery was unremarkable and he again is planning on surgery in a few weeks to the left knee. We planned the similar strategy of holding warfarin 5 days prior to surgery and using Lovenox for bridging. Mr. Vallier should be at low risk for surgery as catheterization indicated no significant coronary disease recently. He  denies chest pain, worsening shortness of breath or associated symptoms. He's been compliant with dialysis. Weight is up slightly 3 pounds since I last saw him. Blood pressure was low normal today but he is asymptomatic with that.  PMHx:  Past Medical History:  Diagnosis Date  . Allergic rhinitis 07/07/2014  . Anemia due to other cause 07/07/2014  . Arthritis    "all over"  . Atrial fibrillation ([place]) 12/30/2012  . CHF (congestive heart failure) ([place]) 12/30/2012  . Chronic lower back pain   . Diabetes mellitus with neuropathy ([place])   . Diabetic retinopathy ([place]) 12/30/2012   no medications  . Dysrhythmia    Afib  . ESRD (end stage renal disease) on dialysis ([place]) started in 2013   MWF; [facility]; [facility]  . GERD (gastroesophageal reflux disease)   . Glaucoma   . History of gout    "right big toe"  . Hyperlipidemia   . Hypertension   . Hypothyroid    01/17/16- no longer on meduication  . Morbid obesity ([place]) 12/30/2012  . Myocardial infarction ([place])    "I've had ~ 3; last one was in ~ 10/2013" (01/30/2014)  . OSA (obstructive sleep apnea) 12/30/2012   "lost weight; no longer needed CPAP; retested said I needed it; didn't followup cause I was feeling fine" (01/30/2014)  . Pulmonary hypertension ([place]) 12/30/2012  . Refusal of blood transfusions as patient is Jehovah's Witness   . Stroke [facility]) ~ 2005; ~ 2005   "they were mild; I didn't even notice I'd had them"; denies residual on 01/30/2014    Past Surgical History:  Procedure Laterality Date  . AV FISTULA PLACEMENT Left ~ 10/2013   "forearm"  . CARDIAC CATHETERIZATION N/A 01/07/2015   Procedure: Left Heart Cath and Coronary Angiography;  Surgeon: Troy Sine, MD;  Location: [facility];  Service: Cardiovascular;  Laterality: N/A;  . CLOSED REDUCTION HIP DISLOCATION Right 1970's  . JOINT REPLACEMENT    . TOTAL HIP ARTHROPLASTY Right 1980  . TOTAL KNEE ARTHROPLASTY Right 01/24/2016   Procedure: TOTAL KNEE  ARTHROPLASTY;  Surgeon: Dorna Leitz, MD;  Location: [facility];  Service: Orthopedics;  Laterality: Right;    FAMHx:  Family History  Problem Relation Age of Onset  . Arthritis Other   . Heart disease Other   . Hyperlipidemia Other   . Hypertension Other   . Kidney disease Other   . Diabetes Other   . Stroke Other   . Diabetes Mother   . Cancer Father     bone cancer  . Heart disease Father   . Diabetes Sister   . Diabetes Brother   . Stroke Brother     SOCHx:   reports that he has never smoked. He has never used smokeless tobacco. He reports that he drinks about 4.2 - 4.8 oz of alcohol per week . He reports that he does not use drugs.  ALLERGIES:  No Known Allergies  ROS: Pertinent items noted in HPI and remainder of comprehensive ROS otherwise negative.  HOME MEDS: Current Outpatient Prescriptions  Medication Sig Dispense Refill  . acetaminophen (TYLENOL)  650 MG CR tablet Take 650 mg by mouth every 8 (eight) hours as needed for pain.    Marland Kitchen allopurinol (ZYLOPRIM) 100 MG tablet Take 1 tablet (100 mg total) by mouth 2 (two) times daily. 60 tablet 0  . Amino Acids-Protein Hydrolys (FEEDING SUPPLEMENT, PRO-STAT SUGAR FREE 64,) LIQD Take 30 mLs by mouth 2 (two) times daily.    Marland Kitchen amitriptyline (ELAVIL) 50 MG tablet Take 1 tablet (50 mg total) by mouth at bedtime. 30 tablet 0  . carvedilol (COREG) 25 MG tablet Take 1 tablet by mouth two times daily on Monday, Wednesday, and Saturday. Give 1 tablet by mouth every Tuesday, Thursday, and Sunday 60 tablet 0  . Dextromethorphan-Guaifenesin (MUCINEX DM) 30-600 MG TB12 Take 1 tablet by mouth 2 (two) times daily.    Marland Kitchen docusate sodium (COLACE) 100 MG capsule Take 100 mg by mouth 2 (two) times daily.    . Multiple Vitamin (MULTIVITAMIN WITH MINERALS) TABS tablet Take 1 tablet by mouth daily.    Marland Kitchen oxyCODONE-acetaminophen (PERCOCET/ROXICET) 5-325 MG tablet Take 1 tablet by mouth every 8 (eight) hours as needed for severe pain. 90 tablet 0  .  Pramoxine-Menthol-Dimethicone (GOLD BOND MEDICATED ANTI ITCH EX) Apply to skin twice a day for itching.    . senna (SENOKOT) 8.6 MG tablet Take 1 tablet by mouth daily.    . simvastatin (ZOCOR) 20 MG tablet Take 1 tablet (20 mg total) by mouth daily. 30 tablet 0  . sodium chloride (OCEAN) 0.65 % SOLN nasal spray Place 1 spray into both nostrils as needed for congestion. 104 mL 0  . warfarin (COUMADIN) 1 MG tablet Give 1/2 tablet along with 3 mg tablet to equal 3.5 mg on Tuesday and Thursday. 15 tablet 0  . warfarin (COUMADIN) 3 MG tablet Take 1 tablet (3 mg total) by mouth daily. Mon, Wed, Fri, Sat and Sun 30 tablet 0  . enoxaparin (LOVENOX) 100 MG/ML injection Inject 1 mL (100 mg total) into the skin daily. 5 Syringe 0   No current facility-administered medications for this visit.     LABS/IMAGING: No results found for this or any previous visit (from the past 48 hour(s)). No results found.  VITALS: BP 98/70   Pulse 79   Ht 5\' 9"  (1.753 m)   Wt 250 lb (113.4 kg)   BMI 36.92 kg/m   EXAM: General appearance: alert, no distress and morbidly obese Neck: no carotid bruit, no JVD and thick Lungs: diminished breath sounds bibasilar Heart: irregularly irregular rhythm Abdomen: soft, non-tender; bowel sounds normal; no masses,  no organomegaly and morbidly obese Extremities: extremities normal, atraumatic, no cyanosis or edema Pulses: 2+ and symmetric Fistula in the left forearm - some surrounding hematoma, faint +thrill Skin: Skin color, texture, turgor normal. No rashes or lesions Neurologic: Grossly normal Psych: Normal mood, affect  EKG: Sinus rhythm with first degree AV block at 79  ASSESSMENT: 1. History of congestive heart failure, LVEF improved to 55-60% in 2016 2. End-stage renal disease on dialysis 3. Persistent atrial fibrillation - CHADSVASC score of 4 4. Hypertension 5. Dyslipidemia - on simvastatin (although evidence does not support statins in ESRD) 6. Morbid  obesity 7. Pulmonary hypertension 8. DM2 9. Status post cardiac arrest 2 associated with missed dialysis - essentially normal arteries by cath in 12/2014  PLAN: 1.   Mr. Luczak has not had any recurrent problems with dialysis or coronary disease. He had a history of persistent A. fib in the past however it may be paroxysmal  as he is in sinus rhythm today at 69 with a first-degree AV block. He is at acceptable risk for surgery. We've already advised Dr. Berenice Primas about management of perioperative anticoagulation. Follow-up with me in 1 year.  Pixie Casino, MD, [facility] Attending Cardiologist [place] 06/15/2016, 10:22 AM

## 2016-06-15 NOTE — Telephone Encounter (Signed)
Dr. Debara Pickett routed clearance to ortho surgeon 06/14/16

## 2016-06-18 ENCOUNTER — Other Ambulatory Visit: Payer: Self-pay | Admitting: Nurse Practitioner

## 2016-06-18 ENCOUNTER — Encounter (HOSPITAL_COMMUNITY): Payer: Self-pay

## 2016-06-18 ENCOUNTER — Inpatient Hospital Stay (HOSPITAL_COMMUNITY)
Admission: RE | Admit: 2016-06-18 | Discharge: 2016-06-18 | Disposition: A | Discharge status: Home / Self Care | Payer: Medicare Other | Source: Ambulatory Visit

## 2016-06-18 DIAGNOSIS — I480 Paroxysmal atrial fibrillation: Secondary | ICD-10-CM

## 2016-06-18 DIAGNOSIS — D631 Anemia in chronic kidney disease: Secondary | ICD-10-CM | POA: Diagnosis not present

## 2016-06-18 DIAGNOSIS — E1129 Type 2 diabetes mellitus with other diabetic kidney complication: Secondary | ICD-10-CM | POA: Diagnosis not present

## 2016-06-18 DIAGNOSIS — N2581 Secondary hyperparathyroidism of renal origin: Secondary | ICD-10-CM | POA: Diagnosis not present

## 2016-06-18 DIAGNOSIS — Z992 Dependence on renal dialysis: Secondary | ICD-10-CM | POA: Diagnosis not present

## 2016-06-18 DIAGNOSIS — N186 End stage renal disease: Secondary | ICD-10-CM | POA: Diagnosis not present

## 2016-06-19 ENCOUNTER — Encounter (HOSPITAL_COMMUNITY)
Admission: RE | Admit: 2016-06-19 | Discharge: 2016-06-19 | Disposition: A | Discharge status: Home / Self Care | Payer: Medicare Other | Source: Ambulatory Visit | Attending: Orthopedic Surgery | Admitting: Orthopedic Surgery

## 2016-06-19 ENCOUNTER — Encounter (HOSPITAL_COMMUNITY): Payer: Self-pay

## 2016-06-19 DIAGNOSIS — Z992 Dependence on renal dialysis: Secondary | ICD-10-CM | POA: Diagnosis not present

## 2016-06-19 DIAGNOSIS — K219 Gastro-esophageal reflux disease without esophagitis: Secondary | ICD-10-CM | POA: Insufficient documentation

## 2016-06-19 DIAGNOSIS — I132 Hypertensive heart and chronic kidney disease with heart failure and with stage 5 chronic kidney disease, or end stage renal disease: Secondary | ICD-10-CM | POA: Diagnosis not present

## 2016-06-19 DIAGNOSIS — Z01812 Encounter for preprocedural laboratory examination: Secondary | ICD-10-CM | POA: Insufficient documentation

## 2016-06-19 DIAGNOSIS — N186 End stage renal disease: Secondary | ICD-10-CM | POA: Diagnosis not present

## 2016-06-19 DIAGNOSIS — E1122 Type 2 diabetes mellitus with diabetic chronic kidney disease: Secondary | ICD-10-CM | POA: Insufficient documentation

## 2016-06-19 DIAGNOSIS — I509 Heart failure, unspecified: Secondary | ICD-10-CM | POA: Insufficient documentation

## 2016-06-19 LAB — CBC WITH DIFFERENTIAL/PLATELET
BASOS ABS: 0.1 10*3/uL (ref 0.0–0.1)
BASOS PCT: 1 %
EOS PCT: 4 %
Eosinophils Absolute: 0.3 10*3/uL (ref 0.0–0.7)
HCT: 37.4 % — ABNORMAL LOW (ref 39.0–52.0)
Hemoglobin: 11.4 g/dL — ABNORMAL LOW (ref 13.0–17.0)
Lymphocytes Relative: 34 %
Lymphs Abs: 2.6 10*3/uL (ref 0.7–4.0)
MCH: 29 pg (ref 26.0–34.0)
MCHC: 30.5 g/dL (ref 30.0–36.0)
MCV: 95.2 fL (ref 78.0–100.0)
MONO ABS: 0.6 10*3/uL (ref 0.1–1.0)
Monocytes Relative: 8 %
Neutro Abs: 4 10*3/uL (ref 1.7–7.7)
Neutrophils Relative %: 53 %
PLATELETS: 215 10*3/uL (ref 150–400)
RBC: 3.93 MIL/uL — ABNORMAL LOW (ref 4.22–5.81)
RDW: 17.1 % — AB (ref 11.5–15.5)
WBC: 7.5 10*3/uL (ref 4.0–10.5)

## 2016-06-19 LAB — SURGICAL PCR SCREEN
MRSA, PCR: NEGATIVE
Staphylococcus aureus: NEGATIVE

## 2016-06-19 LAB — COMPREHENSIVE METABOLIC PANEL
ALBUMIN: 3.6 g/dL (ref 3.5–5.0)
ALT: 15 U/L — ABNORMAL LOW (ref 17–63)
ANION GAP: 14 (ref 5–15)
AST: 17 U/L (ref 15–41)
Alkaline Phosphatase: 108 U/L (ref 38–126)
BILIRUBIN TOTAL: 0.6 mg/dL (ref 0.3–1.2)
BUN: 46 mg/dL — ABNORMAL HIGH (ref 6–20)
CO2: 24 mmol/L (ref 22–32)
CREATININE: 7.65 mg/dL — AB (ref 0.61–1.24)
Calcium: 9.2 mg/dL (ref 8.9–10.3)
Chloride: 97 mmol/L — ABNORMAL LOW (ref 101–111)
GFR calc non Af Amer: 7 mL/min — ABNORMAL LOW (ref >60)
GFR, EST AFRICAN AMERICAN: 8 mL/min — AB (ref >60)
Glucose, Bld: 105 mg/dL — ABNORMAL HIGH (ref 65–99)
Potassium: 4.9 mmol/L (ref 3.5–5.1)
SODIUM: 135 mmol/L (ref 135–145)
TOTAL PROTEIN: 7.7 g/dL (ref 6.5–8.1)

## 2016-06-19 LAB — NO BLOOD PRODUCTS

## 2016-06-19 LAB — PROTIME-INR
INR: 1.55
Prothrombin Time: 18.7 seconds — ABNORMAL HIGH (ref 11.4–15.2)

## 2016-06-19 LAB — HEMOGLOBIN A1C
Hgb A1c MFr Bld: 6.2 % — ABNORMAL HIGH (ref 4.8–5.6)
MEAN PLASMA GLUCOSE: 131 mg/dL

## 2016-06-19 LAB — GLUCOSE, CAPILLARY: GLUCOSE-CAPILLARY: 102 mg/dL — AB (ref 65–99)

## 2016-06-19 LAB — APTT: APTT: 36 s (ref 24–36)

## 2016-06-19 NOTE — Progress Notes (Addendum)
HFW:YOVZCHY, Dibas, MD  Cardiologist: Lyman Bishop, MD  EKG: 06/14/16  In [facility]  Stress test: 6+ years ago at work  ECHO:12/2014 in [facility] Cath: 12/2014 in [facility]  Chest x-ray: 01/2016  Called and left a confidential VM for Manuela Schwartz at Dr. Jackalyn Lombard office informing her of patient's refusal of blood products so no type and screen to be done. Left call back # for questions

## 2016-06-19 NOTE — Pre-Procedure Instructions (Signed)
Daniel Whitney  06/19/2016      [name] (SE), [name] [address] [name] [name] [address] Phone: [phone] Fax: [phone]     Your procedure is scheduled on May 15 at 730 AM.  Report to [name] at 530 AM.  Call this number if you have problems the morning of surgery:  [phone]   Remember:  Do not eat food or drink liquids after midnight.  Take these medicines the morning of surgery with A SIP OF WATER carvediolol (coreg), allopurinol (zyloprim), docusate sodium (colace), oxycodone (percocet)-if needed for pain, senna (senokot)  Stop warfarin (coumadin) as instructed by your surgeon.  7 days prior to surgery STOP taking any Aspirin, Aleve, Naproxen, Ibuprofen, Motrin, Advil, Goody's, BC's, all herbal medications, fish oil, and all vitamins   Do not wear jewelry, make-up or nail polish.  Do not wear lotions, powders, or perfumes, or deoderant.  Do not shave 48 hours prior to surgery.  Men may shave face and neck.  Do not bring valuables to the hospital.  [name] is not responsible for any belongings or valuables.  Contacts, dentures or bridgework may not be worn into surgery.  Leave your suitcase in the car.  After surgery it may be brought to your room.  For patients admitted to the hospital, discharge time will be determined by your treatment team.  Patients discharged the day of surgery will not be allowed to drive home.    Special instructions:  [name]- Preparing For Surgery  Before surgery, you can play an important role. Because skin is not sterile, your skin needs to be as free of germs as possible. You can reduce the number of germs on your skin by washing with CHG (chlorahexidine gluconate) Soap before surgery.  CHG is an antiseptic cleaner which kills germs and bonds with the skin to continue killing germs even after washing.  Please do not use if you have an allergy to  CHG or antibacterial soaps. If your skin becomes reddened/irritated stop using the CHG.  Do not shave (including legs and underarms) for at least 48 hours prior to first CHG shower. It is OK to shave your face.  Please follow these instructions carefully.   1. Shower the NIGHT BEFORE SURGERY and the MORNING OF SURGERY with CHG.   2. If you chose to wash your hair, wash your hair first as usual with your normal shampoo.  3. After you shampoo, rinse your hair and body thoroughly to remove the shampoo.  4. Use CHG as you would any other liquid soap. You can apply CHG directly to the skin and wash gently with a scrungie or a clean washcloth.   5. Apply the CHG Soap to your body ONLY FROM THE NECK DOWN.  Do not use on open wounds or open sores. Avoid contact with your eyes, ears, mouth and genitals (private parts). Wash genitals (private parts) with your normal soap.  6. Wash thoroughly, paying special attention to the area where your surgery will be performed.  7. Thoroughly rinse your body with warm water from the neck down.  8. DO NOT shower/wash with your normal soap after using and rinsing off the CHG Soap.  9. Pat yourself dry with a CLEAN TOWEL.   10. Wear CLEAN PAJAMAS   11. Place CLEAN SHEETS on your bed the night of your first shower and DO NOT SLEEP WITH PETS.  Day of Surgery: Do not apply any deodorants/lotions. Please wear clean clothes to the hospital/surgery center.     Please read over the following fact sheets that you were given. Pain Booklet, Coughing and Deep Breathing, MRSA Information and Surgical Site Infection Prevention

## 2016-06-20 DIAGNOSIS — E1129 Type 2 diabetes mellitus with other diabetic kidney complication: Secondary | ICD-10-CM | POA: Diagnosis not present

## 2016-06-20 DIAGNOSIS — N2581 Secondary hyperparathyroidism of renal origin: Secondary | ICD-10-CM | POA: Diagnosis not present

## 2016-06-20 DIAGNOSIS — N186 End stage renal disease: Secondary | ICD-10-CM | POA: Diagnosis not present

## 2016-06-20 DIAGNOSIS — D631 Anemia in chronic kidney disease: Secondary | ICD-10-CM | POA: Diagnosis not present

## 2016-06-20 DIAGNOSIS — Z992 Dependence on renal dialysis: Secondary | ICD-10-CM | POA: Diagnosis not present

## 2016-06-20 NOTE — [facility] Notes (Signed)
Anesthesia Chart Review:  Pt is a Jehovah's Witness and refuses all blood products.   Pt is a 61 year old male scheduled for L total knee arthroplasty on 06/26/2016 with Dorna Leitz, M.D.  - PCP is Dibas Dorthy Cooler, MD - Cardiologist is Lyman Bishop, MD who cleared pt for surgery at low risk at last office visit 06/14/16.   PMH includes: PAF, CHF, pulmonary hypertension, HTN, DM, stroke, ESRD (on dialysis), OSA, hyperlipidemia, anemia, hypothyroidism, GERD. Never smoker. BMI 37.  Medications include: Carvedilol, Lovenox, simvastatin, Coumadin. Last dose Coumadin 06/20/16. Start Lovenox 06/22/16.  Preoperative labs reviewed.  - Renal function c/w ESRD. - PT 18.7 will repeat DOS.  1 view CXR 01/29/16: Interval removal of LEFT central venous catheter. Stable cardiomegaly and pulmonary vascular congestion. LEFT lung base atelectasis.  EKG 06/14/16: Sinus rhythm with first degree A-V block. LAD.  Cardiac cath 01/07/15:  - Dist RCA lesion, 10% stenosed. - No significant coronary obstructive disease with a normal left main, normal LAD and left circumflex coronary artery, and mild calcification of a dominant RCA without significant stenosis.  Echo 01/05/15:  - Left ventricle: The cavity size was normal. Wall thickness was increased in a pattern of moderate LVH. Systolic function was normal. The estimated ejection fraction was in the range of 55% to 60%. - Left atrium: The atrium was moderately dilated.  If no changes, I anticipate pt can proceed with surgery as scheduled.   Willeen Cass, FNP-BC [facility] Short Stay Surgical Center/Anesthesiology Phone: [phone] 06/20/2016 11:45 AM

## 2016-06-20 NOTE — Telephone Encounter (Signed)
I spoke with the patient who confirmed that Dr. Dorthy Cooler is his PCP whom he saw last week. Daniel Whitney ([place])

## 2016-06-22 DIAGNOSIS — Z992 Dependence on renal dialysis: Secondary | ICD-10-CM | POA: Diagnosis not present

## 2016-06-22 DIAGNOSIS — E1129 Type 2 diabetes mellitus with other diabetic kidney complication: Secondary | ICD-10-CM | POA: Diagnosis not present

## 2016-06-22 DIAGNOSIS — N2581 Secondary hyperparathyroidism of renal origin: Secondary | ICD-10-CM | POA: Diagnosis not present

## 2016-06-22 DIAGNOSIS — D631 Anemia in chronic kidney disease: Secondary | ICD-10-CM | POA: Diagnosis not present

## 2016-06-22 DIAGNOSIS — N186 End stage renal disease: Secondary | ICD-10-CM | POA: Diagnosis not present

## 2016-06-25 DIAGNOSIS — N186 End stage renal disease: Secondary | ICD-10-CM | POA: Diagnosis not present

## 2016-06-25 DIAGNOSIS — D631 Anemia in chronic kidney disease: Secondary | ICD-10-CM | POA: Diagnosis not present

## 2016-06-25 DIAGNOSIS — Z992 Dependence on renal dialysis: Secondary | ICD-10-CM | POA: Diagnosis not present

## 2016-06-25 DIAGNOSIS — E1129 Type 2 diabetes mellitus with other diabetic kidney complication: Secondary | ICD-10-CM | POA: Diagnosis not present

## 2016-06-25 DIAGNOSIS — N2581 Secondary hyperparathyroidism of renal origin: Secondary | ICD-10-CM | POA: Diagnosis not present

## 2016-06-25 NOTE — Anesthesia Preprocedure Evaluation (Addendum)
Anesthesia Evaluation  Patient identified by MRN, date of birth, ID band Patient awake    Reviewed: Allergy & Precautions, H&P , NPO status , Patient's Chart, lab work & pertinent test results, reviewed documented beta blocker date and time   Airway Mallampati: II  TM Distance: >3 FB Neck ROM: Full    Dental no notable dental hx. (+) Partial Upper, Dental Advisory Given   Pulmonary neg pulmonary ROS,    Pulmonary exam normal breath sounds clear to auscultation       Cardiovascular Exercise Tolerance: Good hypertension, Pt. on medications and Pt. on home beta blockers + Past MI and +CHF  + dysrhythmias Atrial Fibrillation  Rhythm:Regular Rate:Normal     Neuro/Psych CVA negative neurological ROS  negative psych ROS   GI/Hepatic Neg liver ROS, GERD  ,  Endo/Other  diabetesHypothyroidism Morbid obesity  Renal/GU ESRF and DialysisRenal disease  negative genitourinary   Musculoskeletal  (+) Arthritis , Osteoarthritis,    Abdominal   Peds  Hematology negative hematology ROS (+)   Anesthesia Other Findings   Reproductive/Obstetrics negative OB ROS                            Anesthesia Physical Anesthesia Plan  ASA: III  Anesthesia Plan: General   Post-op Pain Management:  Regional for Post-op pain   Induction: Intravenous  Airway Management Planned: LMA and Oral ETT  Additional Equipment:   Intra-op Plan:   Post-operative Plan: Extubation in OR  Informed Consent: I have reviewed the patients History and Physical, chart, labs and discussed the procedure including the risks, benefits and alternatives for the proposed anesthesia with the patient or authorized representative who has indicated his/her understanding and acceptance.   Dental advisory given  Plan Discussed with: CRNA  Anesthesia Plan Comments:        Anesthesia Quick Evaluation

## 2016-06-25 NOTE — H&P (Signed)
TOTAL KNEE ADMISSION H&P  Patient is being admitted for left total knee arthroplasty.  Subjective:  Chief Complaint:left knee pain.  HPI: Daniel Whitney, 61 y.o. male, has a history of pain and functional disability in the left knee due to arthritis and has failed non-surgical conservative treatments for greater than 12 weeks to includecorticosteriod injections, viscosupplementation injections, flexibility and strengthening excercises, supervised PT with diminished ADL's post treatment, weight reduction as appropriate and activity modification.  Onset of symptoms was gradual, starting 6 years ago with gradually worsening course since that time. The patient noted prior procedures on the knee to include  arthroscopy and menisectomy on the left knee(s).  Patient currently rates pain in the left knee(s) at 9 out of 10 with activity. Patient has night pain, worsening of pain with activity and weight bearing, pain that interferes with activities of daily living, pain with passive range of motion, crepitus and joint swelling.  Patient has evidence of subchondral cysts, subchondral sclerosis, periarticular osteophytes, joint subluxation and joint space narrowing by imaging studies. This patient has had failure to reasonable conservative care. There is no active infection.the patient is on renal dialysis and will need to spend minimum 2 nights in the hospital getting renal dialysis and getting adequate mobilization prior to will be a specific need first day and skilled nursing facility.  Patient Active Problem List   Diagnosis Date Noted  . Postoperative anemia due to acute blood loss 01/27/2016  . S/P knee replacement 01/24/2016  . Low back pain 02/09/2015  . Bilateral knee pain 01/27/2015  . Atypical atrial flutter ([place]) 01/24/2015  . Acute on chronic combined systolic and diastolic heart failure ([place]) 01/05/2015  . Hyperkalemia 11/08/2014  . End stage renal disease on dialysis ([place]) 11/08/2014  .  Hypocalcemia 11/08/2014  . Recurrent falls 10/13/2014  . Venous stasis ulcer ([place]) 10/13/2014  . Hypoxia   . Leukocytosis   . Respiratory failure ([place])   . ARDS (adult respiratory distress syndrome) ([place])   . Encounter for central line placement   . Cardiopulmonary arrest ([place])   . Compression fracture of L3 lumbar vertebra ([place]) 08/04/2014  . Anemia of chronic renal failure, unspecified stage 07/07/2014  . Allergic rhinitis 07/07/2014  . Right rotator cuff tear 04/27/2014  . SOB (shortness of breath) 01/29/2014  . Acute gouty arthritis 11/11/2013  . Hearing loss in right ear 11/11/2013  . Chronic pain syndrome 11/11/2013  . Right shoulder pain 11/11/2013  . Facial rash 11/01/2013  . Right otitis externa 10/27/2013  . Pulmonary HTN- PA 58 mmHg 04/16/13 09/17/2013  . Right hip pain 08/04/2013  . DOE (dyspnea on exertion) 06/18/2013  . Allergic rhinitis, cause unspecified 06/18/2013  . Carpal tunnel syndrome 06/06/2013  . Leg pain, bilateral 06/06/2013  . Primary localized osteoarthrosis, lower leg 06/04/2013  . Preoperative cardiovascular examination 03/10/2013  . Preventative health care 12/30/2012  . Atrial fibrillation- failed Amiodarone 12/30/2012  . CHF (congestive heart failure) ([place]) 12/30/2012  . Diabetic retinopathy ([place]) 12/30/2012  . Morbid obesity ([place]) 12/30/2012  . OSA (obstructive sleep apnea)- on C-pap 12/30/2012  . Pulmonary hypertension ([place]) 12/30/2012  . Bilateral hearing loss 12/30/2012  . Long term (current) use of anticoagulants 12/30/2012  . Arthritis   . Glaucoma   . Diabetes mellitus with neuropathy ([place])   . Hypertension   . ESRD (end stage renal disease) on dialysis ([place])   . Stroke ([place])   . Hyperlipidemia    Past Medical History:  Diagnosis Date  . Allergic rhinitis  07/07/2014  . Anemia due to other cause 07/07/2014  . Arthritis    "all over"  . Atrial fibrillation ([place]) 12/30/2012  . CHF (congestive heart failure) ([place]) 12/30/2012  .  Chronic lower back pain   . Diabetes mellitus with neuropathy ([place])   . Diabetic retinopathy ([place]) 12/30/2012   no medications  . Dysrhythmia    Afib  . ESRD (end stage renal disease) on dialysis ([place]) started in 2013   MWF; [facility]; [employer]  . GERD (gastroesophageal reflux disease)   . Glaucoma   . History of gout    "right big toe"  . Hyperlipidemia   . Hypertension   . Hypothyroid    01/17/16- no longer on meduication  . Morbid obesity ([place]) 12/30/2012  . Myocardial infarction ([place])    "I've had ~ 3; last one was in ~ 10/2013" (01/30/2014)  . OSA (obstructive sleep apnea) 12/30/2012   "lost weight; no longer needed CPAP; retested said I needed it; didn't followup cause I was feeling fine" (01/30/2014)  . Pulmonary hypertension ([place]) 12/30/2012  . Refusal of blood transfusions as patient is Jehovah's Witness   . Stroke [facility]) ~ 2005; ~ 2005   "they were mild; I didn't even notice I'd had them"; denies residual on 01/30/2014    Past Surgical History:  Procedure Laterality Date  . AV FISTULA PLACEMENT Left ~ 10/2013   "forearm"  . CARDIAC CATHETERIZATION N/A 01/07/2015   Procedure: Left Heart Cath and Coronary Angiography;  Surgeon: Troy Sine, MD;  Location: [facility];  Service: Cardiovascular;  Laterality: N/A;  . CLOSED REDUCTION HIP DISLOCATION Right 1970's  . JOINT REPLACEMENT    . TOTAL HIP ARTHROPLASTY Right 1980  . TOTAL KNEE ARTHROPLASTY Right 01/24/2016   Procedure: TOTAL KNEE ARTHROPLASTY;  Surgeon: Dorna Leitz, MD;  Location[place];  Service: Orthopedics;  Laterality: Right;    No prescriptions prior to admission.   Allergies  Allergen Reactions  . Tobacco [Nicotiana Tabacum] Shortness Of Breath    Social History  Substance Use Topics  . Smoking status: Never Smoker  . Smokeless tobacco: Never Used  . Alcohol use 4.2 - 4.8 oz/week    3 - 4 Standard drinks or equivalent, 3 Shots of liquor, 1 Cans of beer per week     Comment: 01/30/2014  "none in the last 3 weeks; primarily drink w/my friends on the weekends"    Family History  Problem Relation Age of Onset  . Arthritis Other   . Heart disease Other   . Hyperlipidemia Other   . Hypertension Other   . Kidney disease Other   . Diabetes Other   . Stroke Other   . Diabetes Mother   . Cancer Father        bone cancer  . Heart disease Father   . Diabetes Sister   . Diabetes Brother   . Stroke Brother      ROS ROS: I have reviewed the patient's review of systems thoroughly and there are no positive responses as relates to the HPI. Objective:  Physical Exam  Vital signs in last 24 hours:   Well-developed well-nourished patient in no acute distress. Alert and oriented x3 HEENT:within normal limits Cardiac: Regular rate and rhythm Pulmonary: Lungs clear to auscultation Abdomen: Soft and nontender.  Normal active bowel sounds  Musculoskeletal: (left knee: Painful range of motion.  Limited range of motion.  No instability.  Slight valgus malalignment.  Range of motion is 5-85.) Labs: Recent  Results (from the past 2160 hour(s))  POCT INR     Status: Abnormal   Collection Time: 04/03/16 12:00 AM  Result Value Ref Range   INR 2.4 (A) 0.9 - 1.1  Protime-INR     Status: Abnormal   Collection Time: 04/03/16 12:00 AM  Result Value Ref Range   Protime 26.0 (A) 10.0 - 13.8 seconds  Protime-INR     Status: Abnormal   Collection Time: 04/25/16 12:00 AM  Result Value Ref Range   INR 2.1 (A) 0.9 - 1.1    Comment: dialysis  Protime-INR     Status: Abnormal   Collection Time: 05/09/16 12:00 AM  Result Value Ref Range   INR 2.4 (A) 0.9 - 1.1  Protime-INR     Status: Abnormal   Collection Time: 06/06/16 12:00 AM  Result Value Ref Range   INR 2.2 (A) 0.9 - 1.1    Comment: dialysis  POCT INR     Status: None   Collection Time: 06/14/16  3:55 PM  Result Value Ref Range   INR 2.2   Glucose, capillary     Status: Abnormal   Collection Time: 06/19/16  9:26 AM  Result  Value Ref Range   Glucose-Capillary 102 (H) 65 - 99 mg/dL  Hemoglobin A1c     Status: Abnormal   Collection Time: 06/19/16 10:03 AM  Result Value Ref Range   Hgb A1c MFr Bld 6.2 (H) 4.8 - 5.6 %    Comment: (NOTE)         Pre-diabetes: 5.7 - 6.4         Diabetes: >6.4         Glycemic control for adults with diabetes: <7.0    Mean Plasma Glucose 131 mg/dL    Comment: (NOTE) Performed At: [facility] [place], [place] [id number] Lindon Romp MD [id number]   Surgical pcr screen     Status: None   Collection Time: 06/19/16 10:03 AM  Result Value Ref Range   MRSA, PCR NEGATIVE NEGATIVE   Staphylococcus aureus NEGATIVE NEGATIVE    Comment:        The Xpert SA Assay (FDA approved for NASAL specimens in patients over 6 years of age), is one component of a comprehensive surveillance program.  Test performance has been validated by [facility] for patients greater than or equal to 68 year old. It is not intended to diagnose infection nor to guide or monitor treatment.   APTT     Status: None   Collection Time: 06/19/16 10:04 AM  Result Value Ref Range   aPTT 36 24 - 36 seconds  CBC WITH DIFFERENTIAL     Status: Abnormal   Collection Time: 06/19/16 10:04 AM  Result Value Ref Range   WBC 7.5 4.0 - 10.5 K/uL   RBC 3.93 (L) 4.22 - 5.81 MIL/uL   Hemoglobin 11.4 (L) 13.0 - 17.0 g/dL   HCT 37.4 (L) 39.0 - 52.0 %   MCV 95.2 78.0 - 100.0 fL   MCH 29.0 26.0 - 34.0 pg   MCHC 30.5 30.0 - 36.0 g/dL   RDW 17.1 (H) 11.5 - 15.5 %   Platelets 215 150 - 400 K/uL   Neutrophils Relative % 53 %   Neutro Abs 4.0 1.7 - 7.7 K/uL   Lymphocytes Relative 34 %   Lymphs Abs 2.6 0.7 - 4.0 K/uL   Monocytes Relative 8 %   Monocytes Absolute 0.6 0.1 - 1.0 K/uL   Eosinophils Relative  4 %   Eosinophils Absolute 0.3 0.0 - 0.7 K/uL   Basophils Relative 1 %   Basophils Absolute 0.1 0.0 - 0.1 K/uL  Comprehensive metabolic panel     Status: Abnormal   Collection Time:  06/19/16 10:04 AM  Result Value Ref Range   Sodium 135 135 - 145 mmol/L   Potassium 4.9 3.5 - 5.1 mmol/L   Chloride 97 (L) 101 - 111 mmol/L   CO2 24 22 - 32 mmol/L   Glucose, Bld 105 (H) 65 - 99 mg/dL   BUN 46 (H) 6 - 20 mg/dL   Creatinine, Ser 7.65 (H) 0.61 - 1.24 mg/dL   Calcium 9.2 8.9 - 10.3 mg/dL   Total Protein 7.7 6.5 - 8.1 g/dL   Albumin 3.6 3.5 - 5.0 g/dL   AST 17 15 - 41 U/L   ALT 15 (L) 17 - 63 U/L   Alkaline Phosphatase 108 38 - 126 U/L   Total Bilirubin 0.6 0.3 - 1.2 mg/dL   GFR calc non Af Amer 7 (L) >60 mL/min   GFR calc Af Amer 8 (L) >60 mL/min    Comment: (NOTE) The eGFR has been calculated using the CKD EPI equation. This calculation has not been validated in all clinical situations. eGFR's persistently <60 mL/min signify possible Chronic Kidney Disease.    Anion gap 14 5 - 15  Protime-INR     Status: Abnormal   Collection Time: 06/19/16 10:04 AM  Result Value Ref Range   Prothrombin Time 18.7 (H) 11.4 - 15.2 seconds   INR 1.55   No blood products     Status: None   Collection Time: 06/19/16 10:29 AM  Result Value Ref Range   Transfuse 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      TRANSFUSE NO BLOOD PRODUCTS, VERIFIED BY EFULLER RN    Estimated body mass index is 36.92 kg/m as calculated from the following:   Height as of 06/19/16: 5' 9" (1.753 m).   Weight as of 06/19/16: 113.4 kg (250 lb).   Imaging Review Plain radiographs demonstrate severe degenerative joint disease of the left knee(s). The overall alignment ismild valgus. The bone quality appears to be fair for age and reported activity level.  Assessment/Plan:  End stage arthritis, left knee   The patient history, physical examination, clinical judgment of the provider and imaging studies are consistent with end stage degenerative joint disease of the left knee(s) and total knee arthroplasty is deemed medically necessary. The treatment options including medical management, injection therapy arthroscopy and arthroplasty  were discussed at length. The risks and benefits of total knee arthroplasty were presented and reviewed. The risks due to aseptic loosening, infection, stiffness, patella tracking problems, thromboembolic complications and other imponderables were discussed. The patient acknowledged the explanation, agreed to proceed with the plan and consent was signed. Patient is being admitted for inpatient treatment for surgery, pain control, PT, OT, prophylactic antibiotics, VTE prophylaxis, progressive ambulation and ADL's and discharge planning. The patient is planning to be discharged to skilled nursing facility

## 2016-06-26 ENCOUNTER — Inpatient Hospital Stay (HOSPITAL_COMMUNITY)
Admission: RE | Admit: 2016-06-26 | Discharge: 2016-06-29 | DRG: 469 | Disposition: A | Discharge status: Skilled Nursing Facility | Payer: Medicare Other | Source: Ambulatory Visit | Attending: Orthopedic Surgery | Admitting: Orthopedic Surgery

## 2016-06-26 ENCOUNTER — Encounter (HOSPITAL_COMMUNITY): Payer: Self-pay | Admitting: General Practice

## 2016-06-26 ENCOUNTER — Inpatient Hospital Stay (HOSPITAL_COMMUNITY): Payer: Medicare Other

## 2016-06-26 ENCOUNTER — Encounter (HOSPITAL_COMMUNITY)
Admission: RE | Disposition: A | Discharge status: Skilled Nursing Facility | Payer: Self-pay | Source: Ambulatory Visit | Attending: Orthopedic Surgery

## 2016-06-26 ENCOUNTER — Inpatient Hospital Stay (HOSPITAL_COMMUNITY): Payer: Medicare Other | Admitting: Emergency Medicine

## 2016-06-26 DIAGNOSIS — Z96641 Presence of right artificial hip joint: Secondary | ICD-10-CM | POA: Diagnosis present

## 2016-06-26 DIAGNOSIS — Z8249 Family history of ischemic heart disease and other diseases of the circulatory system: Secondary | ICD-10-CM

## 2016-06-26 DIAGNOSIS — M25569 Pain in unspecified knee: Secondary | ICD-10-CM | POA: Diagnosis not present

## 2016-06-26 DIAGNOSIS — E1122 Type 2 diabetes mellitus with diabetic chronic kidney disease: Secondary | ICD-10-CM | POA: Diagnosis present

## 2016-06-26 DIAGNOSIS — Z833 Family history of diabetes mellitus: Secondary | ICD-10-CM

## 2016-06-26 DIAGNOSIS — I5042 Chronic combined systolic (congestive) and diastolic (congestive) heart failure: Secondary | ICD-10-CM | POA: Diagnosis present

## 2016-06-26 DIAGNOSIS — M1712 Unilateral primary osteoarthritis, left knee: Principal | ICD-10-CM | POA: Diagnosis present

## 2016-06-26 DIAGNOSIS — M1990 Unspecified osteoarthritis, unspecified site: Secondary | ICD-10-CM | POA: Diagnosis not present

## 2016-06-26 DIAGNOSIS — I120 Hypertensive chronic kidney disease with stage 5 chronic kidney disease or end stage renal disease: Secondary | ICD-10-CM | POA: Diagnosis not present

## 2016-06-26 DIAGNOSIS — D62 Acute posthemorrhagic anemia: Secondary | ICD-10-CM | POA: Diagnosis not present

## 2016-06-26 DIAGNOSIS — K219 Gastro-esophageal reflux disease without esophagitis: Secondary | ICD-10-CM | POA: Diagnosis present

## 2016-06-26 DIAGNOSIS — E11319 Type 2 diabetes mellitus with unspecified diabetic retinopathy without macular edema: Secondary | ICD-10-CM | POA: Diagnosis present

## 2016-06-26 DIAGNOSIS — Z6838 Body mass index (BMI) 38.0-38.9, adult: Secondary | ICD-10-CM

## 2016-06-26 DIAGNOSIS — N186 End stage renal disease: Secondary | ICD-10-CM

## 2016-06-26 DIAGNOSIS — Z96651 Presence of right artificial knee joint: Secondary | ICD-10-CM | POA: Diagnosis present

## 2016-06-26 DIAGNOSIS — H9193 Unspecified hearing loss, bilateral: Secondary | ICD-10-CM | POA: Diagnosis present

## 2016-06-26 DIAGNOSIS — R488 Other symbolic dysfunctions: Secondary | ICD-10-CM | POA: Diagnosis not present

## 2016-06-26 DIAGNOSIS — I132 Hypertensive heart and chronic kidney disease with heart failure and with stage 5 chronic kidney disease, or end stage renal disease: Secondary | ICD-10-CM | POA: Diagnosis present

## 2016-06-26 DIAGNOSIS — I4891 Unspecified atrial fibrillation: Secondary | ICD-10-CM | POA: Diagnosis present

## 2016-06-26 DIAGNOSIS — G894 Chronic pain syndrome: Secondary | ICD-10-CM | POA: Diagnosis present

## 2016-06-26 DIAGNOSIS — Z6837 Body mass index (BMI) 37.0-37.9, adult: Secondary | ICD-10-CM | POA: Diagnosis not present

## 2016-06-26 DIAGNOSIS — Z8673 Personal history of transient ischemic attack (TIA), and cerebral infarction without residual deficits: Secondary | ICD-10-CM | POA: Diagnosis not present

## 2016-06-26 DIAGNOSIS — M6281 Muscle weakness (generalized): Secondary | ICD-10-CM | POA: Diagnosis not present

## 2016-06-26 DIAGNOSIS — G4733 Obstructive sleep apnea (adult) (pediatric): Secondary | ICD-10-CM | POA: Diagnosis present

## 2016-06-26 DIAGNOSIS — Z992 Dependence on renal dialysis: Secondary | ICD-10-CM | POA: Diagnosis not present

## 2016-06-26 DIAGNOSIS — Z808 Family history of malignant neoplasm of other organs or systems: Secondary | ICD-10-CM

## 2016-06-26 DIAGNOSIS — Z79899 Other long term (current) drug therapy: Secondary | ICD-10-CM

## 2016-06-26 DIAGNOSIS — R262 Difficulty in walking, not elsewhere classified: Secondary | ICD-10-CM | POA: Diagnosis not present

## 2016-06-26 DIAGNOSIS — S8990XA Unspecified injury of unspecified lower leg, initial encounter: Secondary | ICD-10-CM | POA: Diagnosis not present

## 2016-06-26 DIAGNOSIS — E1140 Type 2 diabetes mellitus with diabetic neuropathy, unspecified: Secondary | ICD-10-CM | POA: Diagnosis present

## 2016-06-26 DIAGNOSIS — R1311 Dysphagia, oral phase: Secondary | ICD-10-CM | POA: Diagnosis not present

## 2016-06-26 DIAGNOSIS — I2510 Atherosclerotic heart disease of native coronary artery without angina pectoris: Secondary | ICD-10-CM | POA: Diagnosis present

## 2016-06-26 DIAGNOSIS — N2581 Secondary hyperparathyroidism of renal origin: Secondary | ICD-10-CM | POA: Diagnosis not present

## 2016-06-26 DIAGNOSIS — G8918 Other acute postprocedural pain: Secondary | ICD-10-CM | POA: Diagnosis not present

## 2016-06-26 DIAGNOSIS — I252 Old myocardial infarction: Secondary | ICD-10-CM

## 2016-06-26 DIAGNOSIS — E785 Hyperlipidemia, unspecified: Secondary | ICD-10-CM | POA: Diagnosis present

## 2016-06-26 DIAGNOSIS — Z7901 Long term (current) use of anticoagulants: Secondary | ICD-10-CM

## 2016-06-26 DIAGNOSIS — H409 Unspecified glaucoma: Secondary | ICD-10-CM | POA: Diagnosis not present

## 2016-06-26 DIAGNOSIS — D631 Anemia in chronic kidney disease: Secondary | ICD-10-CM | POA: Diagnosis present

## 2016-06-26 DIAGNOSIS — E8889 Other specified metabolic disorders: Secondary | ICD-10-CM | POA: Diagnosis present

## 2016-06-26 DIAGNOSIS — Z471 Aftercare following joint replacement surgery: Secondary | ICD-10-CM | POA: Diagnosis not present

## 2016-06-26 DIAGNOSIS — Z823 Family history of stroke: Secondary | ICD-10-CM

## 2016-06-26 DIAGNOSIS — Z96652 Presence of left artificial knee joint: Secondary | ICD-10-CM | POA: Diagnosis not present

## 2016-06-26 HISTORY — PX: TOTAL KNEE ARTHROPLASTY: SHX125

## 2016-06-26 LAB — POCT I-STAT 4, (NA,K, GLUC, HGB,HCT)
Glucose, Bld: 125 mg/dL — ABNORMAL HIGH (ref 65–99)
HEMATOCRIT: 34 % — AB (ref 39.0–52.0)
Hemoglobin: 11.6 g/dL — ABNORMAL LOW (ref 13.0–17.0)
POTASSIUM: 4.4 mmol/L (ref 3.5–5.1)
SODIUM: 137 mmol/L (ref 135–145)

## 2016-06-26 LAB — GLUCOSE, CAPILLARY: Glucose-Capillary: 156 mg/dL — ABNORMAL HIGH (ref 65–99)

## 2016-06-26 LAB — PROTIME-INR
INR: 1.08
Prothrombin Time: 14 seconds (ref 11.4–15.2)

## 2016-06-26 SURGERY — ARTHROPLASTY, KNEE, TOTAL
Anesthesia: General | Site: Knee | Laterality: Left

## 2016-06-26 MED ORDER — PRO-STAT SUGAR FREE PO LIQD
30.0000 mL | Freq: Two times a day (BID) | ORAL | Status: DC
  Administered 2016-06-26 – 2016-06-29 (×7): 30 mL via ORAL
  Filled 2016-06-26 (×7): qty 30

## 2016-06-26 MED ORDER — TRANEXAMIC ACID 1000 MG/10ML IV SOLN
1000.0000 mg | INTRAVENOUS | Status: AC
  Administered 2016-06-26: 1000 mg via INTRAVENOUS
  Filled 2016-06-26 (×2): qty 10

## 2016-06-26 MED ORDER — GABAPENTIN 300 MG PO CAPS
300.0000 mg | ORAL_CAPSULE | Freq: Two times a day (BID) | ORAL | Status: DC
  Administered 2016-06-26 – 2016-06-29 (×7): 300 mg via ORAL
  Filled 2016-06-26 (×7): qty 1

## 2016-06-26 MED ORDER — SODIUM CHLORIDE 0.9 % IV SOLN
INTRAVENOUS | Status: DC | PRN
  Administered 2016-06-26 (×2): via INTRAVENOUS

## 2016-06-26 MED ORDER — LIDOCAINE HCL (CARDIAC) 20 MG/ML IV SOLN
INTRAVENOUS | Status: DC | PRN
  Administered 2016-06-26: 60 mg via INTRAVENOUS

## 2016-06-26 MED ORDER — WARFARIN SODIUM 3 MG PO TABS
3.0000 mg | ORAL_TABLET | ORAL | Status: DC
  Administered 2016-06-27 – 2016-06-29 (×2): 3 mg via ORAL
  Filled 2016-06-26 (×3): qty 1

## 2016-06-26 MED ORDER — TRANEXAMIC ACID 1000 MG/10ML IV SOLN
1000.0000 mg | Freq: Once | INTRAVENOUS | Status: AC
  Administered 2016-06-26: 1000 mg via INTRAVENOUS
  Filled 2016-06-26 (×2): qty 10

## 2016-06-26 MED ORDER — ENOXAPARIN SODIUM 100 MG/ML ~~LOC~~ SOLN
100.0000 mg | SUBCUTANEOUS | Status: DC
  Administered 2016-06-27: 100 mg via SUBCUTANEOUS
  Filled 2016-06-26 (×2): qty 1

## 2016-06-26 MED ORDER — ACETAMINOPHEN 500 MG PO TABS
ORAL_TABLET | ORAL | Status: AC
  Administered 2016-06-26: 1000 mg
  Filled 2016-06-26: qty 2

## 2016-06-26 MED ORDER — DIPHENHYDRAMINE HCL 12.5 MG/5ML PO ELIX: 12.5000 mg | ORAL_SOLUTION | ORAL | Status: DC | PRN

## 2016-06-26 MED ORDER — CEFAZOLIN SODIUM-DEXTROSE 2-4 GM/100ML-% IV SOLN
2.0000 g | INTRAVENOUS | Status: AC
  Administered 2016-06-26: 2 g via INTRAVENOUS

## 2016-06-26 MED ORDER — BUPIVACAINE LIPOSOME 1.3 % IJ SUSP
20.0000 mL | INTRAMUSCULAR | Status: DC
  Filled 2016-06-26: qty 20

## 2016-06-26 MED ORDER — GABAPENTIN 300 MG PO CAPS
ORAL_CAPSULE | ORAL | Status: AC
  Administered 2016-06-26: 600 mg
  Filled 2016-06-26: qty 2

## 2016-06-26 MED ORDER — MIDAZOLAM HCL 5 MG/5ML IJ SOLN
INTRAMUSCULAR | Status: DC | PRN
  Administered 2016-06-26: 1 mg via INTRAVENOUS

## 2016-06-26 MED ORDER — BUPIVACAINE HCL (PF) 0.5 % IJ SOLN
INTRAMUSCULAR | Status: DC | PRN
  Administered 2016-06-26: 30 mL

## 2016-06-26 MED ORDER — DEXAMETHASONE SODIUM PHOSPHATE 10 MG/ML IJ SOLN
10.0000 mg | Freq: Two times a day (BID) | INTRAMUSCULAR | Status: AC
  Administered 2016-06-26 – 2016-06-27 (×3): 10 mg via INTRAVENOUS
  Filled 2016-06-26 (×3): qty 1

## 2016-06-26 MED ORDER — AMITRIPTYLINE HCL 50 MG PO TABS
50.0000 mg | ORAL_TABLET | Freq: Every day | ORAL | Status: DC
  Administered 2016-06-26 – 2016-06-28 (×3): 50 mg via ORAL
  Filled 2016-06-26 (×3): qty 1

## 2016-06-26 MED ORDER — WARFARIN - PHYSICIAN DOSING INPATIENT
Freq: Every day | Status: DC
  Administered 2016-06-27: 18:00:00

## 2016-06-26 MED ORDER — CARVEDILOL 25 MG PO TABS
25.0000 mg | ORAL_TABLET | ORAL | Status: DC
  Administered 2016-06-28: 25 mg via ORAL
  Filled 2016-06-26: qty 1

## 2016-06-26 MED ORDER — OXYCODONE HCL 5 MG PO TABS
5.0000 mg | ORAL_TABLET | ORAL | Status: DC | PRN
  Administered 2016-06-26 – 2016-06-29 (×13): 10 mg via ORAL
  Filled 2016-06-26 (×12): qty 2

## 2016-06-26 MED ORDER — OXYCODONE-ACETAMINOPHEN 5-325 MG PO TABS: 1.0000 | ORAL_TABLET | ORAL | 0 refills | Status: DC | PRN

## 2016-06-26 MED ORDER — DOCUSATE SODIUM 100 MG PO CAPS
100.0000 mg | ORAL_CAPSULE | Freq: Two times a day (BID) | ORAL | Status: DC
  Administered 2016-06-26 – 2016-06-29 (×7): 100 mg via ORAL
  Filled 2016-06-26 (×7): qty 1

## 2016-06-26 MED ORDER — HYDROMORPHONE HCL 1 MG/ML IJ SOLN
INTRAMUSCULAR | Status: AC
  Administered 2016-06-26: 0.5 mg via INTRAVENOUS
  Filled 2016-06-26: qty 0.5

## 2016-06-26 MED ORDER — SODIUM CHLORIDE 0.9 % IV SOLN: INTRAVENOUS | Status: DC

## 2016-06-26 MED ORDER — BUPIVACAINE LIPOSOME 1.3 % IJ SUSP
INTRAMUSCULAR | Status: DC | PRN
  Administered 2016-06-26: 20 mL

## 2016-06-26 MED ORDER — BUPIVACAINE HCL (PF) 0.5 % IJ SOLN
INTRAMUSCULAR | Status: AC
  Filled 2016-06-26: qty 30

## 2016-06-26 MED ORDER — PROPOFOL 10 MG/ML IV BOLUS
INTRAVENOUS | Status: AC
  Filled 2016-06-26: qty 40

## 2016-06-26 MED ORDER — CEFAZOLIN SODIUM-DEXTROSE 1-4 GM/50ML-% IV SOLN
1.0000 g | INTRAVENOUS | Status: AC
  Administered 2016-06-27: 1 g via INTRAVENOUS
  Filled 2016-06-26 (×2): qty 50

## 2016-06-26 MED ORDER — ADULT MULTIVITAMIN W/MINERALS CH
1.0000 | ORAL_TABLET | Freq: Every day | ORAL | Status: DC
  Administered 2016-06-26 – 2016-06-29 (×4): 1 via ORAL
  Filled 2016-06-26 (×4): qty 1

## 2016-06-26 MED ORDER — ACETAMINOPHEN 325 MG PO TABS
650.0000 mg | ORAL_TABLET | Freq: Four times a day (QID) | ORAL | Status: DC | PRN
  Administered 2016-06-26 – 2016-06-28 (×2): 650 mg via ORAL
  Filled 2016-06-26 (×2): qty 2

## 2016-06-26 MED ORDER — FENTANYL CITRATE (PF) 100 MCG/2ML IJ SOLN
INTRAMUSCULAR | Status: DC | PRN
  Administered 2016-06-26: 50 ug via INTRAVENOUS
  Administered 2016-06-26: 100 ug via INTRAVENOUS
  Administered 2016-06-26: 50 ug via INTRAVENOUS

## 2016-06-26 MED ORDER — CEFAZOLIN SODIUM-DEXTROSE 2-4 GM/100ML-% IV SOLN
2.0000 g | Freq: Four times a day (QID) | INTRAVENOUS | Status: DC
  Filled 2016-06-26 (×2): qty 100

## 2016-06-26 MED ORDER — BISACODYL 5 MG PO TBEC: 5.0000 mg | DELAYED_RELEASE_TABLET | Freq: Every day | ORAL | Status: DC | PRN

## 2016-06-26 MED ORDER — METHOCARBAMOL 500 MG PO TABS
ORAL_TABLET | ORAL | Status: AC
Start: 2016-06-26 — End: 2016-06-26
  Administered 2016-06-26: 500 mg via ORAL
  Filled 2016-06-26: qty 1

## 2016-06-26 MED ORDER — GLYCOPYRROLATE 0.2 MG/ML IJ SOLN
INTRAMUSCULAR | Status: DC | PRN
  Administered 2016-06-26: 0.6 mg via INTRAVENOUS
  Administered 2016-06-26: .4 mg via INTRAVENOUS

## 2016-06-26 MED ORDER — SEVELAMER CARBONATE 800 MG PO TABS
800.0000 mg | ORAL_TABLET | Freq: Three times a day (TID) | ORAL | Status: DC
  Administered 2016-06-26 – 2016-06-29 (×8): 800 mg via ORAL
  Filled 2016-06-26 (×8): qty 1

## 2016-06-26 MED ORDER — FENTANYL CITRATE (PF) 250 MCG/5ML IJ SOLN
INTRAMUSCULAR | Status: AC
  Filled 2016-06-26: qty 5

## 2016-06-26 MED ORDER — OXYCODONE HCL 5 MG PO TABS
ORAL_TABLET | ORAL | Status: AC
  Administered 2016-06-26: 10 mg via ORAL
  Filled 2016-06-26: qty 2

## 2016-06-26 MED ORDER — PHENYLEPHRINE HCL 10 MG/ML IJ SOLN
INTRAVENOUS | Status: DC | PRN
  Administered 2016-06-26: 25 ug/min via INTRAVENOUS

## 2016-06-26 MED ORDER — ROCURONIUM BROMIDE 100 MG/10ML IV SOLN
INTRAVENOUS | Status: DC | PRN
  Administered 2016-06-26: 50 mg via INTRAVENOUS

## 2016-06-26 MED ORDER — METHOCARBAMOL 500 MG PO TABS
500.0000 mg | ORAL_TABLET | Freq: Four times a day (QID) | ORAL | Status: DC | PRN
  Administered 2016-06-26 – 2016-06-29 (×5): 500 mg via ORAL
  Filled 2016-06-26 (×4): qty 1

## 2016-06-26 MED ORDER — SIMVASTATIN 20 MG PO TABS
20.0000 mg | ORAL_TABLET | Freq: Every day | ORAL | Status: DC
Start: 2016-06-26 — End: 2016-06-29
  Administered 2016-06-26 – 2016-06-29 (×4): 20 mg via ORAL
  Filled 2016-06-26 (×4): qty 1

## 2016-06-26 MED ORDER — CARVEDILOL 25 MG PO TABS
25.0000 mg | ORAL_TABLET | ORAL | Status: DC
  Administered 2016-06-27 (×2): 25 mg via ORAL
  Filled 2016-06-26 (×2): qty 1

## 2016-06-26 MED ORDER — HYDROMORPHONE HCL 1 MG/ML IJ SOLN
INTRAMUSCULAR | Status: AC
  Filled 2016-06-26: qty 0.5

## 2016-06-26 MED ORDER — MIDAZOLAM HCL 2 MG/2ML IJ SOLN
INTRAMUSCULAR | Status: AC
  Filled 2016-06-26: qty 2

## 2016-06-26 MED ORDER — CEFAZOLIN SODIUM-DEXTROSE 2-4 GM/100ML-% IV SOLN
INTRAVENOUS | Status: AC
  Filled 2016-06-26: qty 100

## 2016-06-26 MED ORDER — DM-GUAIFENESIN ER 30-600 MG PO TB12
1.0000 | ORAL_TABLET | Freq: Two times a day (BID) | ORAL | Status: DC
  Administered 2016-06-26 – 2016-06-29 (×7): 1 via ORAL
  Filled 2016-06-26 (×11): qty 1

## 2016-06-26 MED ORDER — PROPOFOL 10 MG/ML IV BOLUS
INTRAVENOUS | Status: DC | PRN
  Administered 2016-06-26: 100 mg via INTRAVENOUS

## 2016-06-26 MED ORDER — METHOCARBAMOL 1000 MG/10ML IJ SOLN
500.0000 mg | Freq: Four times a day (QID) | INTRAMUSCULAR | Status: DC | PRN
  Filled 2016-06-26: qty 5

## 2016-06-26 MED ORDER — HYDROMORPHONE HCL 1 MG/ML IJ SOLN
0.5000 mg | INTRAMUSCULAR | Status: DC | PRN
  Administered 2016-06-26: 1 mg via INTRAVENOUS
  Filled 2016-06-26 (×2): qty 1

## 2016-06-26 MED ORDER — ACETAMINOPHEN 325 MG PO TABS
ORAL_TABLET | ORAL | Status: AC
  Filled 2016-06-26: qty 2

## 2016-06-26 MED ORDER — ONDANSETRON HCL 4 MG/2ML IJ SOLN: 4.0000 mg | Freq: Four times a day (QID) | INTRAMUSCULAR | Status: DC | PRN

## 2016-06-26 MED ORDER — POLYETHYLENE GLYCOL 3350 17 G PO PACK: 17.0000 g | PACK | Freq: Every day | ORAL | Status: DC | PRN

## 2016-06-26 MED ORDER — WARFARIN SODIUM 2.5 MG PO TABS
3.5000 mg | ORAL_TABLET | ORAL | Status: DC
  Administered 2016-06-26 – 2016-06-28 (×2): 3.5 mg via ORAL
  Filled 2016-06-26: qty 3.5
  Filled 2016-06-26: qty 1

## 2016-06-26 MED ORDER — ACETAMINOPHEN 650 MG RE SUPP: 650.0000 mg | Freq: Four times a day (QID) | RECTAL | Status: DC | PRN

## 2016-06-26 MED ORDER — SODIUM CHLORIDE 0.9 % IR SOLN
Status: DC | PRN
  Administered 2016-06-26: 3000 mL

## 2016-06-26 MED ORDER — HYDROMORPHONE HCL 1 MG/ML IJ SOLN
0.2500 mg | INTRAMUSCULAR | Status: DC | PRN
  Administered 2016-06-26 (×2): 0.5 mg via INTRAVENOUS

## 2016-06-26 MED ORDER — TRANEXAMIC ACID 1000 MG/10ML IV SOLN
2000.0000 mg | Freq: Once | INTRAVENOUS | Status: DC
  Filled 2016-06-26: qty 20

## 2016-06-26 MED ORDER — ALUM & MAG HYDROXIDE-SIMETH 200-200-20 MG/5ML PO SUSP: 30.0000 mL | ORAL | Status: DC | PRN

## 2016-06-26 MED ORDER — MAGNESIUM CITRATE PO SOLN: 1.0000 | Freq: Once | ORAL | Status: DC | PRN

## 2016-06-26 MED ORDER — NEOSTIGMINE METHYLSULFATE 10 MG/10ML IV SOLN
INTRAVENOUS | Status: DC | PRN
  Administered 2016-06-26: 2 mg via INTRAVENOUS
  Administered 2016-06-26: 3 mg via INTRAVENOUS

## 2016-06-26 MED ORDER — ONDANSETRON HCL 4 MG PO TABS: 4.0000 mg | ORAL_TABLET | Freq: Four times a day (QID) | ORAL | Status: DC | PRN

## 2016-06-26 MED ORDER — ALLOPURINOL 100 MG PO TABS
100.0000 mg | ORAL_TABLET | Freq: Two times a day (BID) | ORAL | Status: DC
  Administered 2016-06-26 – 2016-06-29 (×7): 100 mg via ORAL
  Filled 2016-06-26 (×7): qty 1

## 2016-06-26 MED ORDER — DOCUSATE SODIUM 100 MG PO CAPS: 100.0000 mg | ORAL_CAPSULE | Freq: Two times a day (BID) | ORAL | Status: DC

## 2016-06-26 SURGICAL SUPPLY — 57 items
BANDAGE ACE 6X5 VEL STRL LF (GAUZE/BANDAGES/DRESSINGS) ×2 IMPLANT
BANDAGE ESMARK 6X9 LF (GAUZE/BANDAGES/DRESSINGS) ×1 IMPLANT
BENZOIN TINCTURE PRP APPL 2/3 (GAUZE/BANDAGES/DRESSINGS) ×2 IMPLANT
BLADE SAGITTAL 25.0X1.19X90 (BLADE) ×2 IMPLANT
BLADE SAW SAG 90X13X1.27 (BLADE) ×2 IMPLANT
BNDG ESMARK 6X9 LF (GAUZE/BANDAGES/DRESSINGS) ×2
BOWL SMART MIX CTS (DISPOSABLE) ×2 IMPLANT
CAP KNEE TOTAL 3 SIGMA ×2 IMPLANT
CEMENT HV SMART SET (Cement) ×2 IMPLANT
COVER SURGICAL LIGHT HANDLE (MISCELLANEOUS) ×2 IMPLANT
CUFF TOURNIQUET SINGLE 34IN LL (TOURNIQUET CUFF) ×2 IMPLANT
CUFF TOURNIQUET SINGLE 44IN (TOURNIQUET CUFF) IMPLANT
DRAPE EXTREMITY T 121X128X90 (DRAPE) ×2 IMPLANT
DRAPE U-SHAPE 47X51 STRL (DRAPES) ×2 IMPLANT
DRSG AQUACEL AG ADV 3.5X10 (GAUZE/BANDAGES/DRESSINGS) ×2 IMPLANT
DRSG PAD ABDOMINAL 8X10 ST (GAUZE/BANDAGES/DRESSINGS) ×2 IMPLANT
DURAPREP 26ML APPLICATOR (WOUND CARE) ×2 IMPLANT
ELECT CAUTERY BLADE 6.4 (BLADE) ×2 IMPLANT
ELECT REM PT RETURN 9FT ADLT (ELECTROSURGICAL) ×2
ELECTRODE REM PT RTRN 9FT ADLT (ELECTROSURGICAL) ×1 IMPLANT
EVACUATOR 1/8 PVC DRAIN (DRAIN) IMPLANT
FACESHIELD WRAPAROUND (MASK) ×2 IMPLANT
GAUZE SPONGE 4X4 12PLY STRL (GAUZE/BANDAGES/DRESSINGS) ×2 IMPLANT
GLOVE BIOGEL PI IND STRL 8 (GLOVE) ×2 IMPLANT
GLOVE BIOGEL PI INDICATOR 8 (GLOVE) ×2
GLOVE ECLIPSE 7.5 STRL STRAW (GLOVE) ×4 IMPLANT
GOWN STRL REUS W/ TWL LRG LVL3 (GOWN DISPOSABLE) ×1 IMPLANT
GOWN STRL REUS W/ TWL XL LVL3 (GOWN DISPOSABLE) ×2 IMPLANT
GOWN STRL REUS W/TWL LRG LVL3 (GOWN DISPOSABLE) ×1
GOWN STRL REUS W/TWL XL LVL3 (GOWN DISPOSABLE) ×2
HANDPIECE INTERPULSE COAX TIP (DISPOSABLE) ×1
HOOD PEEL AWAY FACE SHEILD DIS (HOOD) ×4 IMPLANT
IMMOBILIZER KNEE 22 UNIV (SOFTGOODS) ×2 IMPLANT
KIT BASIN OR (CUSTOM PROCEDURE TRAY) ×2 IMPLANT
KIT ROOM TURNOVER OR (KITS) ×2 IMPLANT
MANIFOLD NEPTUNE II (INSTRUMENTS) ×2 IMPLANT
NEEDLE 22X1 1/2 (OR ONLY) (NEEDLE) ×2 IMPLANT
NS IRRIG 1000ML POUR BTL (IV SOLUTION) ×2 IMPLANT
PACK TOTAL JOINT (CUSTOM PROCEDURE TRAY) ×2 IMPLANT
PAD ARMBOARD 7.5X6 YLW CONV (MISCELLANEOUS) ×4 IMPLANT
SET HNDPC FAN SPRY TIP SCT (DISPOSABLE) ×1 IMPLANT
STAPLER VISISTAT 35W (STAPLE) IMPLANT
STRIP CLOSURE SKIN 1/2X4 (GAUZE/BANDAGES/DRESSINGS) IMPLANT
SUCTION FRAZIER HANDLE 10FR (MISCELLANEOUS) ×1
SUCTION TUBE FRAZIER 10FR DISP (MISCELLANEOUS) ×1 IMPLANT
SUT MNCRL AB 3-0 PS2 18 (SUTURE) ×2 IMPLANT
SUT VIC AB 0 CTB1 27 (SUTURE) ×4 IMPLANT
SUT VIC AB 1 CT1 27 (SUTURE) ×2
SUT VIC AB 1 CT1 27XBRD ANBCTR (SUTURE) ×2 IMPLANT
SUT VIC AB 2-0 CTB1 (SUTURE) ×4 IMPLANT
SYR 50ML LL SCALE MARK (SYRINGE) ×2 IMPLANT
TOWEL OR 17X24 6PK STRL BLUE (TOWEL DISPOSABLE) ×2 IMPLANT
TOWEL OR 17X26 10 PK STRL BLUE (TOWEL DISPOSABLE) ×2 IMPLANT
TRAY CATH 16FR W/PLASTIC CATH (SET/KITS/TRAYS/PACK) IMPLANT
TRAY FOLEY W/METER SILVER 16FR (SET/KITS/TRAYS/PACK) IMPLANT
UPCHARGE REV TRAY MBT KNEE ×2 IMPLANT
WRAP KNEE MAXI GEL POST OP (GAUZE/BANDAGES/DRESSINGS) ×2 IMPLANT

## 2016-06-26 NOTE — Consult Note (Signed)
[facility] Renal Consultation Note    Indication for Consultation:  Management of ESRD/hemodialysis, anemia, hypertension/volume, and secondary hyperparathyroidism. PCP:  HPI: Daniel Whitney is a 61 y.o. male with ESRD, Afib (on warfarin), Type 2 DM, HTN, Hx CVA, CAD, and osteoarthritis who was admitted after elective L total knee replacement.   C/o L knee pain s/p surgery, but otherwise asymptomatic. No CP, dyspnea, fever, chills, abdominal pain, N/V, or diarrhea. Dialyzes MWF at [facility], last HD 5/14.  Past Medical History:  Diagnosis Date  . Allergic rhinitis 07/07/2014  . Anemia due to other cause 07/07/2014  . Arthritis    "all over"  . Atrial fibrillation ([place]) 12/30/2012  . CHF (congestive heart failure) ([place]) 12/30/2012  . Chronic lower back pain   . Diabetes mellitus with neuropathy ([place])   . Diabetic retinopathy ([place]) 12/30/2012   no medications  . Dysrhythmia    Afib  . ESRD (end stage renal disease) on dialysis ([place]) started in 2013   MWF; [facility]; [employer]  . GERD (gastroesophageal reflux disease)   . Glaucoma   . History of gout    "right big toe"  . Hyperlipidemia   . Hypertension   . Hypothyroid    01/17/16- no longer on meduication  . Morbid obesity ([place]) 12/30/2012  . Myocardial infarction ([place])    "I've had ~ 3; last one was in ~ 10/2013" (01/30/2014)  . OSA (obstructive sleep apnea) 12/30/2012   "lost weight; no longer needed CPAP; retested said I needed it; didn't followup cause I was feeling fine" (01/30/2014)  . Pulmonary hypertension ([place]) 12/30/2012  . Refusal of blood transfusions as patient is Jehovah's Witness   . Stroke [facility]) ~ 2005; ~ 2005   "they were mild; I didn't even notice I'd had them"; denies residual on 01/30/2014   Past Surgical History:  Procedure Laterality Date  . AV FISTULA PLACEMENT Left ~ 10/2013   "forearm"  . CARDIAC CATHETERIZATION N/A 01/07/2015   Procedure: Left Heart Cath and Coronary  Angiography;  Surgeon: Troy Sine, MD;  Location: [facility];  Service: Cardiovascular;  Laterality: N/A;  . CLOSED REDUCTION HIP DISLOCATION Right 1970's  . JOINT REPLACEMENT    . TOTAL HIP ARTHROPLASTY Right 1980  . TOTAL KNEE ARTHROPLASTY Right 01/24/2016   Procedure: TOTAL KNEE ARTHROPLASTY;  Surgeon: Dorna Leitz, MD;  Location: [facility];  Service: Orthopedics;  Laterality: Right;  . TOTAL KNEE ARTHROPLASTY Left 06/26/2016   Family History  Problem Relation Age of Onset  . Arthritis Other   . Heart disease Other   . Hyperlipidemia Other   . Hypertension Other   . Kidney disease Other   . Diabetes Other   . Stroke Other   . Diabetes Mother   . Cancer Father        bone cancer  . Heart disease Father   . Diabetes Sister   . Diabetes Brother   . Stroke Brother    Social History:  reports that he has never smoked. He has never used smokeless tobacco. He reports that he drinks about 4.2 - 4.8 oz of alcohol per week . He reports that he does not use drugs.  ROS: As per HPI otherwise negative.  Physical Exam: Vitals:   06/26/16 1030 06/26/16 1045 06/26/16 1100 06/26/16 1121  BP: (!) 119/59 (!) 143/57 (!) 136/54 140/71  Pulse: 86 88 87 87  Resp: 14 (!) 25 (!) 26 20  Temp:   98 F (36.7  C) 98.2 F (36.8 C)  TempSrc:    Oral  SpO2: 100% 100% 98% 100%     General: Well developed, well nourished, in no acute distress. Head: Normocephalic, atraumatic, sclera non-icteric Neck: Supple without lymphadenopathy/masses. JVD not elevated. Lungs: Clear bilaterally to auscultation without wheezes, rales, or rhonchi. Breathing is unlabored. Heart: RRR with normal S1, S2. No murmurs, rubs, or gallops appreciated. Abdomen: Soft, non-tender, non-distended with normoactive bowel sounds.  Musculoskeletal:  Strength and tone appear normal for age. R knee bandaged. Lower extremities: No LE edema or ischemic changes, no open wounds. Neuro: Alert and oriented X 3. Moves all extremities  spontaneously. Psych:  Responds to questions appropriately with a normal affect. Dialysis Access: LUE AVF + thrill.  Allergies  Allergen Reactions  . Tobacco [Nicotiana Tabacum] Shortness Of Breath   Prior to Admission medications   Medication Sig Start Date End Date Taking? Authorizing Provider  allopurinol (ZYLOPRIM) 100 MG tablet Take 1 tablet (100 mg total) by mouth 2 (two) times daily. 04/03/16  Yes Lauree Chandler, NP  amitriptyline (ELAVIL) 50 MG tablet Take 1 tablet (50 mg total) by mouth at bedtime. 04/03/16  Yes Lauree Chandler, NP  carvedilol (COREG) 25 MG tablet Take 1 tablet by mouth two times daily on Monday, Wednesday, and Saturday. Give 1 tablet by mouth every Tuesday, Thursday, and Sunday 04/03/16  Yes Lauree Chandler, NP  Dextromethorphan-Guaifenesin (MUCINEX DM) 30-600 MG TB12 Take 1 tablet by mouth 2 (two) times daily.   Yes [provider]  docusate sodium (COLACE) 100 MG capsule Take 100 mg by mouth 2 (two) times daily.   Yes [provider]  enoxaparin (LOVENOX) 100 MG/ML injection Inject 1 mL (100 mg total) into the skin daily. 06/14/16  Yes Hilty, Nadean Corwin, MD  Multiple Vitamin (MULTIVITAMIN WITH MINERALS) TABS tablet Take 1 tablet by mouth daily.   Yes [provider]  Pramoxine-Menthol-Dimethicone (GOLD BOND MEDICATED ANTI ITCH EX) Apply to skin twice a day for itching.   Yes [provider]  senna (SENOKOT) 8.6 MG tablet Take 1 tablet by mouth daily.   Yes [provider]  simvastatin (ZOCOR) 20 MG tablet Take 1 tablet (20 mg total) by mouth daily. 04/03/16  Yes Lauree Chandler, NP  sodium chloride (OCEAN) 0.65 % SOLN nasal spray Place 1 spray into both nostrils as needed for congestion. 01/31/14  Yes Ghimire, Henreitta Leber, MD  acetaminophen (TYLENOL) 650 MG CR tablet Take 650 mg by mouth every 8 (eight) hours as needed for pain.    [provider]  Amino Acids-Protein Hydrolys (FEEDING SUPPLEMENT, PRO-STAT  SUGAR FREE 64,) LIQD Take 30 mLs by mouth 2 (two) times daily.    [provider]  oxyCODONE-acetaminophen (PERCOCET/ROXICET) 5-325 MG tablet Take 1-2 tablets by mouth every 4 (four) hours as needed for severe pain. 06/26/16   Gary Fleet, PA-C  warfarin (COUMADIN) 1 MG tablet Give 1/2 tablet along with 3 mg tablet to equal 3.5 mg on Tuesday and Thursday. 04/04/16   Lauree Chandler, NP  warfarin (COUMADIN) 3 MG tablet Take 1 tablet (3 mg total) by mouth daily. Mon, Wed, Fri, Sat and Sun 04/04/16   Lauree Chandler, NP   Current Facility-Administered Medications  Medication Dose Route Frequency Provider Last Rate Last Dose  . 0.9 %  sodium chloride infusion   Intravenous Continuous Gary Fleet, PA-C      . acetaminophen (TYLENOL) 325 MG tablet           .  acetaminophen (TYLENOL) tablet 650 mg  650 mg Oral Q6H PRN Gary Fleet, PA-C   650 mg at 06/26/16 1056   Or  . acetaminophen (TYLENOL) suppository 650 mg  650 mg Rectal Q6H PRN Gary Fleet, PA-C      . allopurinol (ZYLOPRIM) tablet 100 mg  100 mg Oral BID Gary Fleet, PA-C   100 mg at 06/26/16 1313  . alum & mag hydroxide-simeth (MAALOX/MYLANTA) 200-200-20 MG/5ML suspension 30 mL  30 mL Oral Q4H PRN Gary Fleet, PA-C      . amitriptyline (ELAVIL) tablet 50 mg  50 mg Oral QHS Gary Fleet, PA-C      . bisacodyl (DULCOLAX) EC tablet 5 mg  5 mg Oral Daily PRN Gary Fleet, PA-C      . [START ON 06/27/2016] carvedilol (COREG) tablet 25 mg  25 mg Oral 2 times per day on Mon Wed Sat Gary Fleet, PA-C      . [START ON 06/28/2016] carvedilol (COREG) tablet 25 mg  25 mg Oral Once per day on Sun Tue Thu Graves, John, MD      . Derrill Memo ON 06/27/2016] ceFAZolin (ANCEF) IVPB 1 g/50 mL premix  1 g Intravenous Q24H Dorna Leitz, MD      . dexamethasone (DECADRON) injection 10 mg  10 mg Intravenous Q12H Gary Fleet, PA-C   10 mg at 06/26/16 1312  . dextromethorphan-guaiFENesin (MUCINEX DM) 30-600 MG per 12 hr tablet 1 tablet  1  tablet Oral BID Gary Fleet, PA-C   1 tablet at 06/26/16 1312  . diphenhydrAMINE (BENADRYL) 12.5 MG/5ML elixir 12.5-25 mg  12.5-25 mg Oral Q4H PRN Gary Fleet, PA-C      . docusate sodium (COLACE) capsule 100 mg  100 mg Oral BID Gary Fleet, PA-C   100 mg at 06/26/16 1312  . [START ON 06/27/2016] enoxaparin (LOVENOX) injection 100 mg  100 mg Subcutaneous Q24H Gary Fleet, PA-C      . feeding supplement (PRO-STAT SUGAR FREE 64) liquid 30 mL  30 mL Oral BID Gary Fleet, PA-C   30 mL at 06/26/16 1312  . gabapentin (NEURONTIN) capsule 300 mg  300 mg Oral BID Gary Fleet, PA-C   300 mg at 06/26/16 1313  . HYDROmorphone (DILAUDID) 1 MG/ML injection           . HYDROmorphone (DILAUDID) injection 0.5-1 mg  0.5-1 mg Intravenous Q3H PRN Gary Fleet, PA-C      . magnesium citrate solution 1 Bottle  1 Bottle Oral Once PRN Gary Fleet, PA-C      . methocarbamol (ROBAXIN) tablet 500 mg  500 mg Oral Q6H PRN Gary Fleet, PA-C   500 mg at 06/26/16 1056   Or  . methocarbamol (ROBAXIN) 500 mg in dextrose 5 % 50 mL IVPB  500 mg Intravenous Q6H PRN Gary Fleet, PA-C      . multivitamin with minerals tablet 1 tablet  1 tablet Oral Daily Gary Fleet, PA-C   1 tablet at 06/26/16 1320  . ondansetron (ZOFRAN) tablet 4 mg  4 mg Oral Q6H PRN Gary Fleet, PA-C       Or  . ondansetron [facility]) injection 4 mg  4 mg Intravenous Q6H PRN Gary Fleet, PA-C      . oxyCODONE (Oxy IR/ROXICODONE) immediate release tablet 5-10 mg  5-10 mg Oral Q3H PRN Gary Fleet, PA-C   10 mg at 06/26/16 1056  . polyethylene glycol (MIRALAX / GLYCOLAX) packet 17 g  17 g Oral Daily PRN Gary Fleet, PA-C      .  simvastatin (ZOCOR) tablet 20 mg  20 mg Oral Daily Gary Fleet, PA-C   20 mg at 06/26/16 1312  . [START ON 06/27/2016] warfarin (COUMADIN) tablet 3 mg  3 mg Oral Once per day on Sun Mon Wed Fri Sat Gary Fleet, PA-C      . warfarin (COUMADIN) tablet 3.5 mg  3.5 mg Oral Once per day on Tue Thu  [place], [place], [place]      . Warfarin - Physician Dosing Inpatient   Does not apply q1800 Dorna Leitz, MD       Labs: Basic Metabolic Panel:  Recent Labs Lab 06/26/16 0739  NA 137  K 4.4  GLUCOSE 125*   CBC:  Recent Labs Lab 06/26/16 0739  HGB 11.6*  HCT 34.0*   CBG:  Recent Labs Lab 06/26/16 0956  GLUCAP 156*   Dialysis Orders:  MWF at [facility] 4 hr, BFR 400, DFR 800, EDW 111kg, 2K/2.5Ca bath, AVF, linear - Heparin 4400 units q HD - Mircera 75mcg IV q 2 weeks (last given 5/4) - Hectoral 2mcg IV weekly - Venofer 50mg  IV weekly  Assessment/Plan: 1.  S/p L TKR (5/15 by Dr. Berenice Primas): Per ortho. 2.  ESRD: Continue MWF schedule, next due 5/16. No heparin with HD s/p surgery. 3.  Hypertension/volume: BP controlled. Keep same EDW/meds for now. 4.  Anemia: Hgb 11.6 (pre-op). No ESA for now, monitor. 5.  Metabolic bone disease: Labs pending. Will need to double check with patient about binders (outpt records: Renvela v. Auryxia); start Renvela 1/meals for now.  Veneta Penton, PA-C 06/26/2016, 1:54 PM  [facility] Pager: [phone]  Pt seen, examined and agree w A/P as above.  Kelly Splinter MD [employer] pager [phone]   06/26/2016, 4:36 PM

## 2016-06-26 NOTE — Op Note (Signed)
NAMEKENNIS, WISSMANN                ACCOUNT NO.:  [id number]  MEDICAL RECORD NO.:  [id number]  LOCATION:  [facility]                         FACILITY:  [facility]  PHYSICIAN:  Alta Corning, M.D.   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  PROCEDURES PERFORMED:  Left total knee replacement with a Sigma system, size 3 femur, size 4 MBT style revision tray, 12.5 mm bridging bearing, and a 38 mm all-polyethylene patella.  SURGEON:  Alta Corning, M.D.  ASSISTANT:  Gary Fleet, P.A.  ANESTHESIA:  General.  BRIEF HISTORY:  Mr. Schoffstall is a 61 year old male with a long history of severe bilateral valgus malalignment, severe bilateral pain with bone-on- bone change on x-ray and knees have been hurting him severely.  We had done his right total knee and has done well with that.  After failure of conservative care in the left knee, he is taken to the operating room for left total knee replacement.  He was having night pain and light activity pain.  X-ray showed severe bone-on-bone change in the lateral compartment.  DESCRIPTION OF PROCEDURE:  The patient was taken to the operating room. After adequate anesthesia was obtained with general anesthetic, the patient was placed supine on the operating room table.  The left leg was prepped and draped in usual sterile fashion.  Following this, the leg was exsanguinated.  Blood pressure tourniquet was inflated to 300 mmHg. Following this, a midline incision was made to subcutaneous tissue down the extensor mechanism and a medial parapatellar arthrotomy was undertaken.  The retropatellar fat pad, synovium, and the anterior aspect of the femur, medial and  lateral meniscus, and the anterior and posterior cruciates were excised.  Following this, attention was turned to the femur where an intramedullary pilot hole was drilled.  A 6 degree valgus alignment was chosen and an 11 mm of distal bone was resected. Once this was completed, attention was turned towards sizing the femur, sized to a 3 size on the other side.  Anterior and posterior cuts were made, chamfers and box.  Attention turned to the tibia where the tibia was cut perpendicular to its long axis with 3 degree posterior slope. It sized to a 4.  It was drilled and keeled for an MBT style revision tray and once this was done, the trial 4 tibia was put in with a 3 femur and a 10 mm bridging bearing.  Attention was turned to the patella, was cut down to a level of 13 mm and a 38 paddle was chosen.  Lugs were drilled.  Trial patella was placed.  Excellent range of motion and stability were achieved at this point.  A little bit loose in both flexion and extension.  We felt 12.5 might be a better choice at this point.  All trial components were removed.  The knee was copiously irrigated with pulsatile lavage, irrigation, and suctioned dry.  Exparel was placed in  the back of the knee and following this, some Exparel was placed on the bone and then dried.  All bony surfaces were dried thoroughly and the final components were cemented into place.  Size 4 MBT style revision tray, size 3 femur, 12.5 mm bridging bearing trial was placed and held while the cement was allowed to completely harden. Once that is completed while the leg was held in extension, the attention was turned towards the patella which was placed and held with a clamp.  All excess bone cement was removed.  Cement was allowed to completely harden.  When this was done, the tourniquet was let down. Exparel had been placed through all the synovial reflections for postoperative pain control.  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  Once this was done, the final poly was placed.  Excellent range of motion and stability were achieved. The medial parapatellar arthrotomy was closed with 1 Vicryl running, the skin with 0 Vicryl and 2-0 Vicryl, and 3-0 Monocryl subcuticular. Benzoin and Steri-Strips were applied.  Sterile compressive dressing was applied.  The patient was taken to the recovery room and was noted to be in satisfactory condition.  Estimated blood loss for this procedure is minimal.     Alta Corning, M.D.     Corliss Skains  D:  06/26/2016  T:  06/26/2016  Job:  [id number]  cc:   Alta Corning, M.D.

## 2016-06-26 NOTE — Evaluation (Addendum)
Physical Therapy Evaluation Patient Details Name: Daniel Whitney MRN: [id number] DOB: Dec 13, 1955 Today's Date: 06/26/2016   History of Present Illness  Pt is a 61 y/o male s/p elective L TKA secondary to L knee OA. Extensive PMH including low back pain, CHF, afib, HTN, DM with neuropathy, ESRD on dialysis MWF, and s/p R TKA, R THA, and L AV fistula placement.   Clinical Impression  Pt s/p surgery above with deficits below. PTA, pt was using RW and WC for primary mobility secondary to L knee pain. Upon evaluation, pt with lethargy and very sleepy. Pt also with decreased safety awareness and limited by post op pain and weakness. Required min to mod A +2 for basic transfers. Pt reports he is going to SNF at d/c to increase independence with functional mobility. Agree with d/c disposition based on current deficits. Will continue to follow to progress functional mobility according to pt tolerance.     Follow Up Recommendations SNF    Equipment Recommendations  None recommended by PT    Recommendations for Other Services       Precautions / Restrictions Precautions Precautions: Knee Precaution Booklet Issued: Yes (comment) Precaution Comments: Reviewed supine exercise handout with pt.  Required Braces or Orthoses: Knee Immobilizer - Left Knee Immobilizer - Left: Other (comment) (Until discontinued) Restrictions Weight Bearing Restrictions: Yes LLE Weight Bearing: Weight bearing as tolerated      Mobility  Bed Mobility Overal bed mobility: Needs Assistance Bed Mobility: Supine to Sit     Supine to sit: Min assist;HOB elevated     General bed mobility comments: Reliant on use of elevated HOB and bed rails for trunk elevation. Min A for LLE management.   Transfers Overall transfer level: Needs assistance Equipment used: Rolling walker (2 wheeled) Transfers: Sit to/from [facility] Sit to Stand: Mod assist;+2 safety/equipment Stand pivot transfers: Min assist;+2  safety/equipment       General transfer comment: Pt attempted to stand X 1, however, unable to come up to full standing with max A. Pt beginning to transfer before fully standing and required verbal cues and manual assist to sit safely. Called NT to assist with transfer; required mod A +2 to perform sit>stand transfer. Min A +2 for stand pivot transfer.   Ambulation/Gait Ambulation/Gait assistance: Min assist;+2 safety/equipment Ambulation Distance (Feet): 1 Feet Assistive device: Rolling walker (2 wheeled) Gait Pattern/deviations: Step-through pattern;Decreased step length - left;Decreased weight shift to left;Antalgic Gait velocity: Decreased Gait velocity interpretation: Below normal speed for age/gender General Gait Details: Able to side step up towards chair with min A +2. Further mobility deferred secondary to lethargy and decreased safety awareness.   Stairs            Wheelchair Mobility    Modified Rankin (Stroke Patients Only)       Balance Overall balance assessment: Needs assistance Sitting-balance support: No upper extremity supported;Feet supported Sitting balance-Leahy Scale: Fair Sitting balance - Comments: Slight posterior lean in sitting. Able to self correct.  Postural control: Posterior lean Standing balance support: Bilateral upper extremity supported;During functional activity Standing balance-Leahy Scale: Poor Standing balance comment: Reliant on RW for stability.                              Pertinent Vitals/Pain Pain Assessment: 0-10 Pain Score: 10-Worst pain ever Pain Location: L knee  Pain Descriptors / Indicators: Aching;Constant;Penetrating Pain Intervention(s): Limited activity within patient's tolerance;Monitored during session;Repositioned  Home Living Family/patient expects to be discharged to:: Skilled nursing facility Living Arrangements: Other relatives                    Prior Function Level of Independence:  Independent with assistive device(s)         Comments: Used RW, but sometimes had to use WC secondary to pain.      Hand Dominance   Dominant Hand: Right    Extremity/Trunk Assessment   Upper Extremity Assessment Upper Extremity Assessment: Overall WFL for tasks assessed    Lower Extremity Assessment Lower Extremity Assessment: LLE deficits/detail LLE Deficits / Details: Neuropathy at baseline in feet. Deficits consistent with post op pain and weakness.     Cervical / Trunk Assessment Cervical / Trunk Assessment: Kyphotic  Communication   Communication: No difficulties  Cognition Arousal/Alertness: Lethargic;Suspect due to medications Behavior During Therapy: [facility] for tasks assessed/performed Overall Cognitive Status: Difficult to assess                                 General Comments: Pt very sleepy this session and with decreased safety awareness. Attempted to transfer without fully standing with RW. Required verbal cues and manual assist to lower safely to surface.       General Comments General comments (skin integrity, edema, etc.): Pt requesting pain meds upon entry, but falling asleep. Called RN and stated pt was not due for pain meds yet. Reported to pt and pt requesting to talk to RN. RN came during session to speak with pt.     Exercises Total Joint Exercises Ankle Circles/Pumps: AROM;Both;15 reps;Supine Quad Sets: AROM;Left;Both;10 reps;Supine Towel Squeeze: AROM;Both;10 reps;Supine Hip ABduction/ADduction: AAROM;Left;10 reps;Supine   Assessment/Plan    PT Assessment Patient needs continued PT services  PT Problem List Decreased strength;Decreased range of motion;Decreased activity tolerance;Decreased balance;Decreased mobility;Decreased coordination;Decreased cognition;Decreased knowledge of use of DME;Decreased safety awareness;Decreased knowledge of precautions;Pain       PT Treatment Interventions DME instruction;Gait  training;Functional mobility training;Therapeutic activities;Therapeutic exercise;Balance training;Neuromuscular re-education;Patient/family education    PT Goals (Current goals can be found in the Care Plan section)  Acute Rehab PT Goals Patient Stated Goal: to decrease pain  PT Goal Formulation: With patient Time For Goal Achievement: 07/03/16 Potential to Achieve Goals: Good    Frequency 7X/week   Barriers to discharge        Co-evaluation               AM-PAC PT "6 Clicks" Daily Activity  Outcome Measure Difficulty turning over in bed (including adjusting bedclothes, sheets and blankets)?: Total Difficulty moving from lying on back to sitting on the side of the bed? : Total Difficulty sitting down on and standing up from a chair with arms (e.g., wheelchair, bedside commode, etc,.)?: Total Help needed moving to and from a bed to chair (including a wheelchair)?: A Little Help needed walking in hospital room?: A Lot Help needed climbing 3-5 steps with a railing? : A Lot 6 Click Score: 10    End of Session Equipment Utilized During Treatment: Gait belt;Left knee immobilizer Activity Tolerance: Patient limited by pain;Patient limited by lethargy Patient left: in chair;with call bell/phone within reach Nurse Communication: Mobility status PT Visit Diagnosis: Other abnormalities of gait and mobility (R26.89);Pain Pain - Right/Left: Left Pain - part of body: Knee    Time: 7654-6503 PT Time Calculation (min) (ACUTE ONLY): 29 min   Charges:  PT Evaluation $PT Eval Moderate Complexity: 1 Procedure PT Treatments $Therapeutic Activity: 8-22 mins   PT G Codes:        Nicky Pugh, PT, DPT  Acute Rehabilitation Services  Pager: [phone]   Army Melia 06/26/2016, 3:22 PM

## 2016-06-26 NOTE — Progress Notes (Signed)
Orthopedic Tech Progress Note Patient Details:  Daniel Whitney 01/26/1956 [id number]  CPM Left Knee CPM Left Knee: On Left Knee Flexion (Degrees): 90 Left Knee Extension (Degrees): 0 Additional Comments: foot roll   [place] Pink 06/26/2016, 10:54 AM

## 2016-06-26 NOTE — Brief Op Note (Signed)
06/26/2016  9:06 AM  PATIENT:  Daniel Whitney  61 y.o. male  PRE-OPERATIVE DIAGNOSIS:  OSTEOARTHRITIS LEFT KNEE  POST-OPERATIVE DIAGNOSIS:  OSTEOARTHRITIS LEFT KNEE  PROCEDURE:  Procedure(s): TOTAL KNEE ARTHROPLASTY (Left)  SURGEON:  Surgeon(s) and Role:    Dorna Leitz, MD - Primary  PHYSICIAN ASSISTANT:   ASSISTANTS: bethune   ANESTHESIA:   general  EBL:  No intake/output data recorded.  BLOOD ADMINISTERED:none  DRAINS: none   LOCAL MEDICATIONS USED:  MARCAINE    and OTHER experel  SPECIMEN:  No Specimen  DISPOSITION OF SPECIMEN:  N/A  COUNTS:  YES  TOURNIQUET:   Total Tourniquet Time Documented: Thigh (Left) - 54 minutes Total: Thigh (Left) - 54 minutes   DICTATION: .Other Dictation: Dictation Number [id number]  PLAN OF CARE: Admit to inpatient   PATIENT DISPOSITION:  PACU - hemodynamically stable.   Delay start of Pharmacological VTE agent (>24hrs) due to surgical blood loss or risk of bleeding: no

## 2016-06-26 NOTE — Transfer of Care (Signed)
Immediate Anesthesia Transfer of Care Note  Patient: Daniel Whitney  Procedure(s) Performed: Procedure(s): TOTAL KNEE ARTHROPLASTY (Left)  Patient Location: PACU  Anesthesia Type:General with regional for post-op pain  Level of Consciousness: awake, alert  and oriented  Airway & Oxygen Therapy: Patient Spontanous Breathing and Patient connected to nasal cannula oxygen  Post-op Assessment: Report given to RN and Post -op Vital signs reviewed and stable  Post vital signs: Reviewed and stable  Last Vitals:  Vitals:   06/26/16 0945 06/26/16 0951  BP:  (!) 118/47  Pulse:  85  Resp: 15 (!) 22  Temp:  36.6 C    Last Pain:  Vitals:   06/26/16 0652  TempSrc:   PainSc: 7          Complications: No apparent anesthesia complications

## 2016-06-26 NOTE — Anesthesia Procedure Notes (Signed)
Procedure Name: Intubation Date/Time: 06/26/2016 7:46 AM Performed by: Valda Favia Pre-anesthesia Checklist: Patient identified, Emergency Drugs available, Suction available, Patient being monitored and Timeout performed Patient Re-evaluated:Patient Re-evaluated prior to inductionOxygen Delivery Method: Circle system utilized Preoxygenation: Pre-oxygenation with 100% oxygen Intubation Type: IV induction Ventilation: Mask ventilation without difficulty and Oral airway inserted - appropriate to patient size Laryngoscope Size: Glidescope and 4 Grade View: Grade II Tube type: Oral Tube size: 7.5 mm Number of attempts: 1 Airway Equipment and Method: Stylet and Video-laryngoscopy Placement Confirmation: ETT inserted through vocal cords under direct vision,  positive ETCO2 and breath sounds checked- equal and bilateral Secured at: 22 cm Tube secured with: Tape Dental Injury: Teeth and Oropharynx as per pre-operative assessment

## 2016-06-26 NOTE — Anesthesia Postprocedure Evaluation (Signed)
Anesthesia Post Note  Patient: Daniel Whitney  Procedure(s) Performed: Procedure(s) (LRB): TOTAL KNEE ARTHROPLASTY (Left)  Patient location during evaluation: PACU Anesthesia Type: General and Regional Level of consciousness: awake and alert Pain management: pain level controlled Vital Signs Assessment: post-procedure vital signs reviewed and stable Respiratory status: spontaneous breathing, nonlabored ventilation, respiratory function stable and patient connected to nasal cannula oxygen Cardiovascular status: blood pressure returned to baseline and stable Postop Assessment: no signs of nausea or vomiting Anesthetic complications: no       Last Vitals:  Vitals:   06/26/16 1100 06/26/16 1121  BP: (!) 136/54 140/71  Pulse: 87 87  Resp: (!) 26 20  Temp: 36.7 C 36.8 C    Last Pain:  Vitals:   06/26/16 1121  TempSrc: Oral  PainSc:                  Terease Marcotte,W. EDMOND

## 2016-06-26 NOTE — Anesthesia Procedure Notes (Signed)
Anesthesia Regional Block: Adductor canal block   Pre-Anesthetic Checklist: ,, timeout performed, Correct Patient, Correct Site, Correct Laterality, Correct Procedure, Correct Position, site marked, Risks and benefits discussed, pre-op evaluation,  At surgeon's request and post-op pain management  Laterality: Left  Prep: Maximum Sterile Barrier Precautions used, chloraprep       Needles:  Injection technique: Single-shot  Needle Type: Echogenic Stimulator Needle     Needle Length: 9cm  Needle Gauge: 21     Additional Needles:   Procedures: ultrasound guided,,,,,,,,  Narrative:  Start time: 06/26/2016 7:01 AM End time: 06/26/2016 7:11 AM Injection made incrementally with aspirations every 5 mL. Anesthesiologist: Roderic Palau  Additional Notes: 2% Lidocaine skin wheel.

## 2016-06-27 ENCOUNTER — Encounter (HOSPITAL_COMMUNITY): Payer: Self-pay | Admitting: Orthopedic Surgery

## 2016-06-27 LAB — BASIC METABOLIC PANEL
ANION GAP: 13 (ref 5–15)
BUN: 56 mg/dL — ABNORMAL HIGH (ref 6–20)
CALCIUM: 9.5 mg/dL (ref 8.9–10.3)
CO2: 22 mmol/L (ref 22–32)
CREATININE: 9.15 mg/dL — AB (ref 0.61–1.24)
Chloride: 96 mmol/L — ABNORMAL LOW (ref 101–111)
GFR, EST AFRICAN AMERICAN: 6 mL/min — AB (ref >60)
GFR, EST NON AFRICAN AMERICAN: 5 mL/min — AB (ref >60)
Glucose, Bld: 224 mg/dL — ABNORMAL HIGH (ref 65–99)
Potassium: 5.7 mmol/L — ABNORMAL HIGH (ref 3.5–5.1)
SODIUM: 131 mmol/L — AB (ref 135–145)

## 2016-06-27 LAB — CBC
HEMATOCRIT: 32.6 % — AB (ref 39.0–52.0)
Hemoglobin: 9.8 g/dL — ABNORMAL LOW (ref 13.0–17.0)
MCH: 28.8 pg (ref 26.0–34.0)
MCHC: 30.1 g/dL (ref 30.0–36.0)
MCV: 95.9 fL (ref 78.0–100.0)
Platelets: 180 10*3/uL (ref 150–400)
RBC: 3.4 MIL/uL — ABNORMAL LOW (ref 4.22–5.81)
RDW: 16.3 % — AB (ref 11.5–15.5)
WBC: 16 10*3/uL — AB (ref 4.0–10.5)

## 2016-06-27 LAB — PROTIME-INR
INR: 1.13
PROTHROMBIN TIME: 14.5 s (ref 11.4–15.2)

## 2016-06-27 MED ORDER — HEPARIN SODIUM (PORCINE) 1000 UNIT/ML DIALYSIS: 1000.0000 [IU] | INTRAMUSCULAR | Status: DC | PRN

## 2016-06-27 MED ORDER — ENOXAPARIN SODIUM 100 MG/ML ~~LOC~~ SOLN: 100.0000 mg | Freq: Every evening | SUBCUTANEOUS | Status: DC

## 2016-06-27 MED ORDER — SODIUM CHLORIDE 0.9 % IV SOLN: 100.0000 mL | INTRAVENOUS | Status: DC | PRN

## 2016-06-27 MED ORDER — LIDOCAINE HCL (PF) 1 % IJ SOLN: 5.0000 mL | INTRAMUSCULAR | Status: DC | PRN

## 2016-06-27 MED ORDER — LIDOCAINE-PRILOCAINE 2.5-2.5 % EX CREA: 1.0000 "application " | TOPICAL_CREAM | CUTANEOUS | Status: DC | PRN

## 2016-06-27 MED ORDER — PENTAFLUOROPROP-TETRAFLUOROETH EX AERO: 1.0000 "application " | INHALATION_SPRAY | CUTANEOUS | Status: DC | PRN

## 2016-06-27 NOTE — Progress Notes (Signed)
Physical Therapy Treatment Patient Details Name: Daniel Whitney MRN: [id number] DOB: August 05, 1955 Today's Date: 06/27/2016    History of Present Illness Pt is a 61 y/o male s/p elective L TKA secondary to L knee OA. Extensive PMH including low back pain, CHF, afib, HTN, DM with neuropathy, ESRD on dialysis MWF, and s/p R TKA, R THA, and L AV fistula placement.     PT Comments    Attempted to progress gait this session, however, pt very sleepy and extremely unsteady. Required max A +2 to take a few steps, so further gait training deferred secondary to decreased safety. Pt with difficulty sequencing with RW during mobility tasks and with confusion this session. Tended to focus more on R knee and had to educate multiple times that LLE was post op L TKA. Current recommendations appropriate. Will continue to progress mobility according to pt tolerance.    Follow Up Recommendations  SNF     Equipment Recommendations  None recommended by PT    Recommendations for Other Services       Precautions / Restrictions Precautions Precautions: Knee Precaution Booklet Issued: Yes (comment) Precaution Comments: progressed supine ther ex  Required Braces or Orthoses: Knee Immobilizer - Left Knee Immobilizer - Left: Other (comment) (until discontinued ) Restrictions Weight Bearing Restrictions: Yes LLE Weight Bearing: Weight bearing as tolerated    Mobility  Bed Mobility Overal bed mobility: Needs Assistance Bed Mobility: Supine to Sit     Supine to sit: Min assist;+2 for physical assistance     General bed mobility comments: Min A +2 for trunk elevation and LLE management.   Transfers Overall transfer level: Needs assistance Equipment used: Rolling walker (2 wheeled) Transfers: Sit to/from [facility] Sit to Stand: Max assist;+2 physical assistance Stand pivot transfers: Max assist;+2 physical assistance       General transfer comment: Attempted to stand X 2, however, pt  unable to power through RLE. Requried elevated surface and max A +2 to stand. Pt very unsteady once standing. Attempted to take steps once steady, however, pt only able to take a few steps, and performed stand pivot secondary to very unsteady gait. Max A +2 for transfer.   Ambulation/Gait Ambulation/Gait assistance: Max assist;+2 physical assistance Ambulation Distance (Feet): 1 Feet Assistive device: Rolling walker (2 wheeled) Gait Pattern/deviations: Step-to pattern;Decreased step length - left;Decreased weight shift to left;Antalgic;Ataxic;Trunk flexed Gait velocity: Decreased Gait velocity interpretation: Below normal speed for age/gender General Gait Details: Very unsteady, ataxic gait. Pt requiring max A +2 to take 2-3 steps with RW. Verbal cues to push through UE to offweight LLE and manual assist required for RW management.    Stairs            Wheelchair Mobility    Modified Rankin (Stroke Patients Only)       Balance Overall balance assessment: Needs assistance Sitting-balance support: No upper extremity supported;Feet supported Sitting balance-Leahy Scale: Fair     Standing balance support: Bilateral upper extremity supported;During functional activity Standing balance-Leahy Scale: Poor Standing balance comment: RW and mod-max A +2 for stability                             Cognition Arousal/Alertness: Lethargic;Suspect due to medications Behavior During Therapy: [facility] for tasks assessed/performed Overall Cognitive Status: No family/caregiver present to determine baseline cognitive functioning  General Comments: Pt with confusion, wanting to do exercise for R knee however, had L TKA. Educated that main focus is for L knee. Pt confused about when he was here for L knee and stated rehab was for L knee in December, however, corrected later to rehab being for R knee in December.       Exercises Total Joint  Exercises Ankle Circles/Pumps: AROM;Both;10 reps;Supine Quad Sets: AROM;Left;10 reps;Supine (unable to fully straighten ) Heel Slides: AAROM;Left;Supine;5 reps (partial range) Goniometric ROM: 20-55    General Comments General comments (skin integrity, edema, etc.): Pt very unsteady this session. Pt asleep upon entry, requiring tactile cues to awaken. Pt very sleepy, but requesting pain meds. Notified RN      Pertinent Vitals/Pain Pain Assessment: Faces Faces Pain Scale: Hurts little more Pain Location: L knee  Pain Descriptors / Indicators: Aching;Constant;Operative site guarding Pain Intervention(s): Limited activity within patient's tolerance;Monitored during session;Repositioned    Home Living                      Prior Function            PT Goals (current goals can now be found in the care plan section) Acute Rehab PT Goals Patient Stated Goal: to decrease pain  PT Goal Formulation: With patient Time For Goal Achievement: 07/03/16 Potential to Achieve Goals: Good Progress towards PT goals: Progressing toward goals    Frequency    7X/week      PT Plan Current plan remains appropriate    Co-evaluation              AM-PAC PT "6 Clicks" Daily Activity  Outcome Measure  Difficulty turning over in bed (including adjusting bedclothes, sheets and blankets)?: Total Difficulty moving from lying on back to sitting on the side of the bed? : Total Difficulty sitting down on and standing up from a chair with arms (e.g., wheelchair, bedside commode, etc,.)?: Total Help needed moving to and from a bed to chair (including a wheelchair)?: A Lot Help needed walking in hospital room?: A Lot Help needed climbing 3-5 steps with a railing? : Total 6 Click Score: 8    End of Session Equipment Utilized During Treatment: Gait belt;Left knee immobilizer Activity Tolerance: Patient limited by pain;Patient limited by lethargy Patient left: in chair;with call  bell/phone within reach Nurse Communication: Mobility status PT Visit Diagnosis: Other abnormalities of gait and mobility (R26.89);Pain Pain - Right/Left: Left Pain - part of body: Knee     Time: 7342-8768 PT Time Calculation (min) (ACUTE ONLY): 26 min  Charges:  $Gait Training: 8-22 mins $Therapeutic Exercise: 8-22 mins                    G Codes:       Nicky Pugh, PT, DPT  Acute Rehabilitation Services  Pager: [phone]    Army Melia 06/27/2016, 3:52 PM

## 2016-06-27 NOTE — Progress Notes (Signed)
  [facility] Progress Note   Subjective: stable on HD  Vitals:   06/27/16 1030 06/27/16 1100 06/27/16 1115 06/27/16 1141  BP: (!) 118/53 118/66 123/68 135/69  Pulse: 96 99 98 100  Resp:    18  Temp:    98 F (36.7 C)  TempSrc:    Oral  SpO2:    98%  Weight:    110.8 kg (244 lb 4.3 oz)    Inpatient medications: . allopurinol  100 mg Oral BID  . amitriptyline  50 mg Oral QHS  . carvedilol  25 mg Oral 2 times per day on Mon Wed Sat  . [START ON 06/28/2016] carvedilol  25 mg Oral Once per day on Sun Tue Thu  . dexamethasone  10 mg Intravenous Q12H  . dextromethorphan-guaiFENesin  1 tablet Oral BID  . docusate sodium  100 mg Oral BID  . enoxaparin  100 mg Subcutaneous Q24H  . feeding supplement (PRO-STAT SUGAR FREE 64)  30 mL Oral BID  . gabapentin  300 mg Oral BID  . multivitamin with minerals  1 tablet Oral Daily  . sevelamer carbonate  800 mg Oral TID WC  . simvastatin  20 mg Oral Daily  . warfarin  3 mg Oral Once per day on Sun Mon Wed Fri Sat  . warfarin  3.5 mg Oral Once per day on Tue Thu  . Warfarin - Physician Dosing Inpatient   Does not apply q1800   . sodium chloride    . sodium chloride    . methocarbamol (ROBAXIN)  IV     sodium chloride, sodium chloride, acetaminophen **OR** acetaminophen, alum & mag hydroxide-simeth, bisacodyl, diphenhydrAMINE, heparin, HYDROmorphone (DILAUDID) injection, lidocaine (PF), lidocaine-prilocaine, magnesium citrate, methocarbamol **OR** methocarbamol (ROBAXIN)  IV, ondansetron **OR** ondansetron (ZOFRAN) IV, oxyCODONE, pentafluoroprop-tetrafluoroeth, polyethylene glycol  Exam: Alert, nasal O2 No distress, on HD No jvd Chest clear bilat RRR no mrg Abd soft ntnd Ext no edema LUE AVF +thrill NF, Ox3  Dialysis: MWF at [facility] 4 hr, BFR 400, DFR 800, EDW 111kg, 2K/2.5Ca bath, AVF, linear - Heparin 4400 units q HD - Mircera 61mcg IV q 2 weeks (last given 5/4) - Hectoral 43mcg IV weekly - Venofer 50mg   IV weekly  Assessment: 1.  S/p L TKR (5/15 by Dr. Berenice Primas): Per ortho. 2.  ESRD: Continue MWF schedule. HD today 3.  Hypertension/volume: BP controlled. Keep same EDW/meds for now. 4.  Anemia: Hgb 11.6 (pre-op). No ESA for now, monitor. 5.  Metabolic bone disease: Labs pending. Will need to double check with patient about binders (outpt records: Renvela v. Auryxia); start Renvela 1/meals for now.   Plan - HD today   Kelly Splinter MD [facility] pager [phone]   06/27/2016, 11:58 AM    Recent Labs Lab 06/26/16 0739 06/27/16 0709  NA 137 131*  K 4.4 5.7*  CL  --  96*  CO2  --  22  GLUCOSE 125* 224*  BUN  --  56*  CREATININE  --  9.15*  CALCIUM  --  9.5   No results for input(s): AST, ALT, ALKPHOS, BILITOT, PROT, ALBUMIN in the last 168 hours.  Recent Labs Lab 06/26/16 0739 06/27/16 0709  WBC  --  16.0*  HGB 11.6* 9.8*  HCT 34.0* 32.6*  MCV  --  95.9  PLT  --  180   Iron/TIBC/Ferritin/ %Sat No results found for: IRON, TIBC, FERRITIN, IRONPCTSAT

## 2016-06-27 NOTE — Progress Notes (Signed)
PT Cancellation Note  Patient Details Name: Rawlin Reaume MRN: [id number] DOB: 01-19-1956   Cancelled Treatment:    Reason Eval/Treat Not Completed: Patient at procedure or test/unavailable Pt currently at hemodialysis. Will reattempt as schedule allows.   Nicky Pugh, PT, DPT  Acute Rehabilitation Services  Pager: [phone]    Army Melia 06/27/2016, 10:51 AM

## 2016-06-27 NOTE — Progress Notes (Signed)
Subjective: 1 Day Post-Op Procedure(s) (LRB): TOTAL KNEE ARTHROPLASTY (Left) Patient reports pain as mild.  Pt seen in dialysis this am at 820.  Objective: Vital signs in last 24 hours: Temp:  [97.4 F (36.3 C)-98.9 F (37.2 C)] 97.4 F (36.3 C) (05/16 0735) Pulse Rate:  [85-99] 98 (05/16 1115) Resp:  [18-20] 18 (05/16 0745) BP: (118-174)/(53-87) 123/68 (05/16 1115) SpO2:  [97 %-98 %] 98 % (05/16 0735) Weight:  [111.4 kg (245 lb 9.5 oz)] 111.4 kg (245 lb 9.5 oz) (05/16 0735)  Intake/Output from previous day: 05/15 0701 - 05/16 0700 In: 700 [P.O.:50; I.V.:650] Out: 50 [Blood:50] Intake/Output this shift: No intake/output data recorded.   Recent Labs  06/26/16 0739 06/27/16 0709  HGB 11.6* 9.8*    Recent Labs  06/26/16 0739 06/27/16 0709  WBC  --  16.0*  RBC  --  3.40*  HCT 34.0* 32.6*  PLT  --  180    Recent Labs  06/26/16 0739 06/27/16 0709  NA 137 131*  K 4.4 5.7*  CL  --  96*  CO2  --  22  BUN  --  56*  CREATININE  --  9.15*  GLUCOSE 125* 224*  CALCIUM  --  9.5    Recent Labs  06/26/16 0648 06/27/16 0709  INR 1.08 1.13   Left knee exam: Neurovascular intact Sensation intact distally Intact pulses distally Dorsiflexion/Plantar flexion intact Incision: dressing C/D/I Compartment soft  Assessment/Plan: 1 Day Post-Op Procedure(s) (LRB): TOTAL KNEE ARTHROPLASTY (Left)  Plan: wbat on left Up with therapy Discharge to SNF probably Friday Cont hemodialysis on usual schedule while in hospital. Resume coumadin/lovenox for dvt prophylaxis  Jaxon Mynhier G 06/27/2016, 11:40 AM

## 2016-06-28 LAB — CBC
HCT: 29.4 % — ABNORMAL LOW (ref 39.0–52.0)
HEMOGLOBIN: 9.2 g/dL — AB (ref 13.0–17.0)
MCH: 30.5 pg (ref 26.0–34.0)
MCHC: 31.3 g/dL (ref 30.0–36.0)
MCV: 97.4 fL (ref 78.0–100.0)
Platelets: 159 10*3/uL (ref 150–400)
RBC: 3.02 MIL/uL — ABNORMAL LOW (ref 4.22–5.81)
RDW: 16.8 % — ABNORMAL HIGH (ref 11.5–15.5)
WBC: 13.8 10*3/uL — ABNORMAL HIGH (ref 4.0–10.5)

## 2016-06-28 LAB — HEPARIN LEVEL (UNFRACTIONATED): HEPARIN UNFRACTIONATED: 0.12 [IU]/mL — AB (ref 0.30–0.70)

## 2016-06-28 LAB — PROTIME-INR
INR: 1.18
Prothrombin Time: 15.1 seconds (ref 11.4–15.2)

## 2016-06-28 MED ORDER — HEPARIN (PORCINE) IN NACL 100-0.45 UNIT/ML-% IJ SOLN
2000.0000 [IU]/h | INTRAMUSCULAR | Status: DC
Start: 2016-06-28 — End: 2016-06-29
  Administered 2016-06-28: 1400 [IU]/h via INTRAVENOUS
  Administered 2016-06-29: 1700 [IU]/h via INTRAVENOUS
  Filled 2016-06-28 (×4): qty 250

## 2016-06-28 MED ORDER — DARBEPOETIN ALFA 100 MCG/0.5ML IJ SOSY
100.0000 ug | PREFILLED_SYRINGE | INTRAMUSCULAR | Status: DC
  Administered 2016-06-29: 100 ug via INTRAVENOUS

## 2016-06-28 MED ORDER — WARFARIN - PHARMACIST DOSING INPATIENT: Freq: Every day | Status: DC

## 2016-06-28 MED ORDER — WARFARIN - PHYSICIAN DOSING INPATIENT
Freq: Every day | Status: DC
  Administered 2016-06-28: 19:00:00

## 2016-06-28 NOTE — Progress Notes (Signed)
ANTICOAGULATION CONSULT NOTE - Follow-Up  Pharmacy Consult for heparin (+warfarin per MD - PTA dose continued) Indication: atrial fibrillation  Allergies  Allergen Reactions  . Tobacco [Nicotiana Tabacum] Shortness Of Breath    Patient Measurements: Weight: 244 lb 4.3 oz (110.8 kg) Heparin Dosing Weight: 95 kg Ideal Body Weight: ~71 kg  Vital Signs: Temp: 99.6 F (37.6 C) (05/17 1351) Temp Source: Oral (05/17 1351) BP: 111/58 (05/17 1538) Pulse Rate: 79 (05/17 1351)  Labs:  Recent Labs  06/26/16 0648  06/26/16 0739 06/27/16 0709 06/28/16 0538 06/28/16 1812  HGB  --   < > 11.6* 9.8* 9.2*  --   HCT  --   --  34.0* 32.6* 29.4*  --   PLT  --   --   --  180 159  --   LABPROT 14.0  --   --  14.5 15.1  --   INR 1.08  --   --  1.13 1.18  --   HEPARINUNFRC  --   --   --   --   --  0.12*  CREATININE  --   --   --  9.15*  --   --   < > = values in this interval not displayed.  Estimated Creatinine Clearance: 10.4 mL/min (A) (by C-G formula based on SCr of 9.15 mg/dL (H)).   Assessment: 61 yo male with ESRD on HD admitted for L TKR (5/15). Patient is on warfarin PTA for h/o afib/flutter. PTA dose = 3.5 mg Tues, Thurs; 3 mg all other days. Patient was originally on lovenox bridge to therapeutic warfarin, however pharmacy now consulted to change to heparin given ESRD and risk of accumulation. The patient continues on warfarin per MD.    Heparin level this evening is SUBtherapeutic (HL 0.12, goal of 0.3-0.7). Hgb 9.2, plts wnl  Goal of Therapy:  Heparin level 0.3-0.7 units/ml Monitor platelets by anticoagulation protocol: Yes   Plan:  1. Increase Heparin to 1700 units/hr (17 ml/hr) 2. Will continue to monitor for any signs/symptoms of bleeding and will follow up with heparin level in the a.m.   Thank you for allowing pharmacy to be a part of this patient's care.  Alycia Rossetti, PharmD, BCPS Clinical Pharmacist Pager: [phone] 06/28/2016 7:52 PM

## 2016-06-28 NOTE — Progress Notes (Signed)
Subjective: 2 Days Post-Op Procedure(s) (LRB): TOTAL KNEE ARTHROPLASTY (Left) Patient reports pain as moderate. Taking by mouth okay. Denies dizziness. Had hemodialysis yesterday morning.  Objective: Vital signs in last 24 hours: Temp:  [98.1 F (36.7 C)-98.7 F (37.1 C)] 98.2 F (36.8 C) (05/17 0900) Pulse Rate:  [81-87] 87 (05/17 0900) Resp:  [18] 18 (05/17 0421) BP: (125-158)/(70-88) 158/88 (05/17 0900) SpO2:  [94 %-100 %] 95 % (05/17 0900)  Intake/Output from previous day: 05/16 0701 - 05/17 0700 In: 360 [P.O.:360] Out: 950 [Urine:450] Intake/Output this shift: Total I/O In: 240 [P.O.:240] Out: -    Recent Labs  06/26/16 0739 06/27/16 0709 06/28/16 0538  HGB 11.6* 9.8* 9.2*    Recent Labs  06/27/16 0709 06/28/16 0538  WBC 16.0* 13.8*  RBC 3.40* 3.02*  HCT 32.6* 29.4*  PLT 180 159    Recent Labs  06/26/16 0739 06/27/16 0709  NA 137 131*  K 4.4 5.7*  CL  --  96*  CO2  --  22  BUN  --  56*  CREATININE  --  9.15*  GLUCOSE 125* 224*  CALCIUM  --  9.5    Recent Labs  06/27/16 0709 06/28/16 0538  INR 1.13 1.18  Left knee exam:  Sensation intact distally Intact pulses distally Dorsiflexion/Plantar flexion intact Incision: dressing C/D/I Compartment soft Calf soft and nontender. Moves foot actively.  Assessment/Plan: 2 Days Post-Op Procedure(s) (LRB): TOTAL KNEE ARTHROPLASTY (Left) Acute blood loss anemia, expected, asymptomatic.  Plan: Continue conversion back to oral Coumadin on usual dose for DVT prophylaxis. Physical therapy weightbearing as tolerated on the left lower extremity. Mobilize patient. Anticipate discharge to skilled nursing facility Friday.   Ferol Laiche G 06/28/2016, 1:15 PM

## 2016-06-28 NOTE — Progress Notes (Addendum)
ANTICOAGULATION CONSULT NOTE - Initial Consult  Pharmacy Consult for heparin (+warfarin per MD - PTA dose continued) Indication: atrial fibrillation  Allergies  Allergen Reactions  . Tobacco [Nicotiana Tabacum] Shortness Of Breath    Patient Measurements: Weight: 244 lb 4.3 oz (110.8 kg) Heparin Dosing Weight: 95 kg Ideal Body Weight: ~71 kg  Vital Signs: Temp: 98.2 F (36.8 C) (05/17 0900) Temp Source: Oral (05/17 0900) BP: 158/88 (05/17 0900) Pulse Rate: 87 (05/17 0900)  Labs:  Recent Labs  06/26/16 0648  06/26/16 0739 06/27/16 0709 06/28/16 0538  HGB  --   < > 11.6* 9.8* 9.2*  HCT  --   --  34.0* 32.6* 29.4*  PLT  --   --   --  180 159  LABPROT 14.0  --   --  14.5 15.1  INR 1.08  --   --  1.13 1.18  CREATININE  --   --   --  9.15*  --   < > = values in this interval not displayed.  Estimated Creatinine Clearance: 10.4 mL/min (A) (by C-G formula based on SCr of 9.15 mg/dL (H)).   Medical History: Past Medical History:  Diagnosis Date  . Allergic rhinitis 07/07/2014  . Anemia due to other cause 07/07/2014  . Arthritis    "all over"  . Atrial fibrillation ([place]) 12/30/2012  . CHF (congestive heart failure) ([place]) 12/30/2012  . Chronic lower back pain   . Diabetes mellitus with neuropathy ([place])   . Diabetic retinopathy ([place]) 12/30/2012   no medications  . Dysrhythmia    Afib  . ESRD (end stage renal disease) on dialysis ([place]) started in 2013   MWF; [facility]; [facility]  . GERD (gastroesophageal reflux disease)   . Glaucoma   . History of gout    "right big toe"  . Hyperlipidemia   . Hypertension   . Hypothyroid    01/17/16- no longer on meduication  . Morbid obesity ([place]) 12/30/2012  . Myocardial infarction ([place])    "I've had ~ 3; last one was in ~ 10/2013" (01/30/2014)  . OSA (obstructive sleep apnea) 12/30/2012   "lost weight; no longer needed CPAP; retested said I needed it; didn't followup cause I was feeling fine" (01/30/2014)  . Pulmonary  hypertension ([place]) 12/30/2012  . Refusal of blood transfusions as patient is Jehovah's Witness   . Stroke [facility]) ~ 2005; ~ 2005   "they were mild; I didn't even notice I'd had them"; denies residual on 01/30/2014    Assessment: 61 yo male with ESRD on HD admitted for L TKR (5/15). Patient is on warfarin PTA for h/o afib/flutter. PTA dose = 3.5 mg Tues, Thurs; 3 mg all other days. Patient was originally on lovenox bridge to therapeutic warfarin, however pharmacy now consulted to change to heparin given ESRD and risk of accumulation.    INR 1.08 on admission, today 1.18 after restarting warfarin 5/15 after OR. Hgb down to 9.2 post-op, PLTC stable. Diet ordered and good PO intake. No new DDI noted. No bleeding noted.    Goal of Therapy:  Heparin level 0.3-0.7 units/ml Monitor platelets by anticoagulation protocol: Yes   Plan:  Start heparin infusion at 1400 units/hr Check anti-Xa level in 8 hours and daily while on heparin Continue to monitor H&H and platelets Warfarin continues at PTA dose per MD   Carlean Jews, Pharm.D. PGY1 Pharmacy Resident 5/17/201810:33 AM Pager [phone]

## 2016-06-28 NOTE — Progress Notes (Signed)
  [facility] Progress Note   Subjective:  Seen in room. Still with knee pain, but has been up with PT. No CP, dyspnea, fever.  Objective Vitals:   06/27/16 1250 06/27/16 2025 06/28/16 0421 06/28/16 0900  BP: 138/68 (!) 146/72 125/70 (!) 158/88  Pulse: (!) 101 81 83 87  Resp: 19 18 18    Temp: 99.9 F (37.7 C) 98.1 F (36.7 C) 98.7 F (37.1 C) 98.2 F (36.8 C)  TempSrc: Oral Oral Oral Oral  SpO2: 96% 94% 100% 95%  Weight:       Physical Exam General: Well appearing, NAD. Heart: RRR; no murmur Lungs:  CTAB Extremities: No LE edema, bandaged L knee Dialysis Access: LUE AVF + thrill  Additional Objective Labs: Basic Metabolic Panel:  Recent Labs Lab 06/26/16 0739 06/27/16 0709  NA 137 131*  K 4.4 5.7*  CL  --  96*  CO2  --  22  GLUCOSE 125* 224*  BUN  --  56*  CREATININE  --  9.15*  CALCIUM  --  9.5   CBC:  Recent Labs Lab 06/26/16 0739 06/27/16 0709 06/28/16 0538  WBC  --  16.0* 13.8*  HGB 11.6* 9.8* 9.2*  HCT 34.0* 32.6* 29.4*  MCV  --  95.9 97.4  PLT  --  180 159   Medications: . heparin 1,400 Units/hr (06/28/16 1157)  . methocarbamol (ROBAXIN)  IV     . allopurinol  100 mg Oral BID  . amitriptyline  50 mg Oral QHS  . carvedilol  25 mg Oral 2 times per day on Mon Wed Sat  . carvedilol  25 mg Oral Once per day on Sun Tue Thu  . dextromethorphan-guaiFENesin  1 tablet Oral BID  . docusate sodium  100 mg Oral BID  . feeding supplement (PRO-STAT SUGAR FREE 64)  30 mL Oral BID  . gabapentin  300 mg Oral BID  . multivitamin with minerals  1 tablet Oral Daily  . sevelamer carbonate  800 mg Oral TID WC  . simvastatin  20 mg Oral Daily  . warfarin  3 mg Oral Once per day on Sun Mon Wed Fri Sat  . warfarin  3.5 mg Oral Once per day on Tue Thu  . Warfarin - Physician Dosing Inpatient   Does not apply q1800    Dialysis Orders: MWF at [facility] 4 hr, BFR 400, DFR 800, EDW 111kg, 2K/2.5Ca bath, AVF, linear, heparin 4400 bolus -  Mircera 23mcg IV q 2 weeks (last given 5/4) - Hectoral 11mcg IV weekly - Venofer 50mg  IV weekly  Assessment/Plan: 1. S/p L TKR (5/15 by Dr. Berenice Primas): Per ortho. 2. ESRD: Continue MWF schedule, next 5/18. 3. Hypertension/volume: BP fine. Keep same EDW/meds for now. 4. Anemia: Hgb 9.2. Restart ESA tomorrow (Aranesp 152mcg) 5. Metabolic bone disease: Ca ok, Continue Renvela 1/meals for now as binder.  Veneta Penton, PA-C 06/28/2016, 12:35 PM  [facility] Pager: [phone]  Pt seen, examined and agree w A/P as above. For prob dc to SNF tomorrow. Resuming anticoag with coumadin/ lovenox.  Kelly Splinter MD [employer] pager [phone]   06/28/2016, 1:57 PM

## 2016-06-28 NOTE — Discharge Instructions (Signed)
INSTRUCTIONS AFTER JOINT REPLACEMENT  ° °o Remove items at home which could result in a fall. This includes throw rugs or furniture in walking pathways °o ICE to the affected joint every three hours while awake for 30 minutes at a time, for at least the first 3-5 days, and then as needed for pain and swelling.  Continue to use ice for pain and swelling. You may notice swelling that will progress down to the foot and ankle.  This is normal after surgery.  Elevate your leg when you are not up walking on it.   °o Continue to use the breathing machine you got in the hospital (incentive spirometer) which will help keep your temperature down.  It is common for your temperature to cycle up and down following surgery, especially at night when you are not up moving around and exerting yourself.  The breathing machine keeps your lungs expanded and your temperature down. ° ° °DIET:  As you were doing prior to hospitalization, we recommend a well-balanced diet. ° °DRESSING / WOUND CARE / SHOWERING ° °Keep the surgical dressing until follow up.  The dressing is water proof, so you can shower without any extra covering.  IF THE DRESSING FALLS OFF or the wound gets wet inside, change the dressing with sterile gauze.  Please use good hand washing techniques before changing the dressing.  Do not use any lotions or creams on the incision until instructed by your surgeon.   ° °ACTIVITY ° °o Increase activity slowly as tolerated, but follow the weight bearing instructions below.   °o No driving for 6 weeks or until further direction given by your physician.  You cannot drive while taking narcotics.  °o No lifting or carrying greater than 10 lbs. until further directed by your surgeon. °o Avoid periods of inactivity such as sitting longer than an hour when not asleep. This helps prevent blood clots.  °o You may return to work once you are authorized by your doctor.  ° ° ° °WEIGHT BEARING  ° °Weight bearing as tolerated with assist  device (walker, cane, etc) as directed, use it as long as suggested by your surgeon or therapist, typically at least 4-6 weeks. ° ° °EXERCISES ° °Results after joint replacement surgery are often greatly improved when you follow the exercise, range of motion and muscle strengthening exercises prescribed by your doctor. Safety measures are also important to protect the joint from further injury. Any time any of these exercises cause you to have increased pain or swelling, decrease what you are doing until you are comfortable again and then slowly increase them. If you have problems or questions, call your caregiver or physical therapist for advice.  ° °Rehabilitation is important following a joint replacement. After just a few days of immobilization, the muscles of the leg can become weakened and shrink (atrophy).  These exercises are designed to build up the tone and strength of the thigh and leg muscles and to improve motion. Often times heat used for twenty to thirty minutes before working out will loosen up your tissues and help with improving the range of motion but do not use heat for the first two weeks following surgery (sometimes heat can increase post-operative swelling).  ° °These exercises can be done on a training (exercise) mat, on the floor, on a table or on a bed. Use whatever works the best and is most comfortable for you.    Use music or television while you are exercising so that   the exercises are a pleasant break in your day. This will make your life better with the exercises acting as a break in your routine that you can look forward to.   Perform all exercises about fifteen times, three times per day or as directed.  You should exercise both the operative leg and the other leg as well. ° °Exercises include: °  °• Quad Sets - Tighten up the muscle on the front of the thigh (Quad) and hold for 5-10 seconds.   °• Straight Leg Raises - With your knee straight (if you were given a brace, keep it on),  lift the leg to 60 degrees, hold for 3 seconds, and slowly lower the leg.  Perform this exercise against resistance later as your leg gets stronger.  °• Leg Slides: Lying on your back, slowly slide your foot toward your buttocks, bending your knee up off the floor (only go as far as is comfortable). Then slowly slide your foot back down until your leg is flat on the floor again.  °• Angel Wings: Lying on your back spread your legs to the side as far apart as you can without causing discomfort.  °• Hamstring Strength:  Lying on your back, push your heel against the floor with your leg straight by tightening up the muscles of your buttocks.  Repeat, but this time bend your knee to a comfortable angle, and push your heel against the floor.  You may put a pillow under the heel to make it more comfortable if necessary.  ° °A rehabilitation program following joint replacement surgery can speed recovery and prevent re-injury in the future due to weakened muscles. Contact your doctor or a physical therapist for more information on knee rehabilitation.  ° ° °CONSTIPATION ° °Constipation is defined medically as fewer than three stools per week and severe constipation as less than one stool per week.  Even if you have a regular bowel pattern at home, your normal regimen is likely to be disrupted due to multiple reasons following surgery.  Combination of anesthesia, postoperative narcotics, change in appetite and fluid intake all can affect your bowels.  ° °YOU MUST use at least one of the following options; they are listed in order of increasing strength to get the job done.  They are all available over the counter, and you may need to use some, POSSIBLY even all of these options:   ° °Drink plenty of fluids (prune juice may be helpful) and high fiber foods °Colace 100 mg by mouth twice a day  °Senokot for constipation as directed and as needed Dulcolax (bisacodyl), take with full glass of water  °Miralax (polyethylene glycol)  once or twice a day as needed. ° °If you have tried all these things and are unable to have a bowel movement in the first 3-4 days after surgery call either your surgeon or your primary doctor.   ° °If you experience loose stools or diarrhea, hold the medications until you stool forms back up.  If your symptoms do not get better within 1 week or if they get worse, check with your doctor.  If you experience "the worst abdominal pain ever" or develop nausea or vomiting, please contact the office immediately for further recommendations for treatment. ° ° °ITCHING:  If you experience itching with your medications, try taking only a single pain pill, or even half a pain pill at a time.  You can also use Benadryl over the counter for itching or also to   help with sleep.   TED HOSE STOCKINGS:  Use stockings on both legs until for at least 2 weeks or as directed by physician office. They may be removed at night for sleeping.  MEDICATIONS:  See your medication summary on the After Visit Summary that nursing will review with you.  You may have some home medications which will be placed on hold until you complete the course of blood thinner medication.  It is important for you to complete the blood thinner medication as prescribed.  PRECAUTIONS:  If you experience chest pain or shortness of breath - call 911 immediately for transfer to the hospital emergency department.   If you develop a fever greater that 101 F, purulent drainage from wound, increased redness or drainage from wound, foul odor from the wound/dressing, or calf pain - CONTACT YOUR SURGEON.                                                   FOLLOW-UP APPOINTMENTS:  If you do not already have a post-op appointment, please call the office for an appointment to be seen by your surgeon.  Guidelines for how soon to be seen are listed in your After Visit Summary, but are typically between 1-4 weeks after surgery.  OTHER INSTRUCTIONS:   Knee  Replacement:  Do not place pillow under knee, focus on keeping the knee straight while resting. CPM instructions: 0-90 degrees, 2 hours in the morning, 2 hours in the afternoon, and 2 hours in the evening. Place foam block, curve side up under heel at all times except when in CPM or when walking.  DO NOT modify, tear, cut, or change the foam block in any way.  MAKE SURE YOU:   Understand these instructions.   Get help right away if you are not doing well or get worse.    Thank you for letting us be a part of your medical care team.  It is a privilege we respect greatly.  We hope these instructions will help you stay on track for a fast and full recovery!    Information on my medicine - Coumadin   (Warfarin)  This medication education was reviewed with me or my healthcare representative as part of my discharge preparation.  The pharmacist that spoke with me during my hospital stay was:  Saundra Shelling, [facility]  Why was Coumadin prescribed for you? Coumadin was prescribed for you because you have a blood clot or a medical condition that can cause an increased risk of forming blood clots. Blood clots can cause serious health problems by blocking the flow of blood to the heart, lung, or brain. Coumadin can prevent harmful blood clots from forming. As a reminder your indication for Coumadin is:   Stroke Prevention Because Of Atrial Fibrillation  What test will check on my response to Coumadin? While on Coumadin (warfarin) you will need to have an INR test regularly to ensure that your dose is keeping you in the desired range. The INR (international normalized ratio) number is calculated from the result of the laboratory test called prothrombin time (PT).  If an INR APPOINTMENT HAS NOT ALREADY BEEN MADE FOR YOU please schedule an appointment to have this lab work done by your health care provider within 7 days. Your INR goal is usually a number between:  2 to 3  or your provider may give you a more  narrow range like 2-2.5.  Ask your health care provider during an office visit what your goal INR is.  What  do you need to  know  About  COUMADIN? Take Coumadin (warfarin) exactly as prescribed by your healthcare provider about the same time each day.  DO NOT stop taking without talking to the doctor who prescribed the medication.  Stopping without other blood clot prevention medication to take the place of Coumadin may increase your risk of developing a new clot or stroke.  Get refills before you run out.  What do you do if you miss a dose? If you miss a dose, take it as soon as you remember on the same day then continue your regularly scheduled regimen the next day.  Do not take two doses of Coumadin at the same time.  Important Safety Information A possible side effect of Coumadin (Warfarin) is an increased risk of bleeding. You should call your healthcare provider right away if you experience any of the following: ? Bleeding from an injury or your nose that does not stop. ? Unusual colored urine (red or dark brown) or unusual colored stools (red or black). ? Unusual bruising for unknown reasons. ? A serious fall or if you hit your head (even if there is no bleeding).  Some foods or medicines interact with Coumadin (warfarin) and might alter your response to warfarin. To help avoid this: ? Eat a balanced diet, maintaining a consistent amount of Vitamin K. ? Notify your provider about major diet changes you plan to make. ? Avoid alcohol or limit your intake to 1 drink for women and 2 drinks for men per day. (1 drink is 5 oz. wine, 12 oz. beer, or 1.5 oz. liquor.)  Make sure that ANY health care provider who prescribes medication for you knows that you are taking Coumadin (warfarin).  Also make sure the healthcare provider who is monitoring your Coumadin knows when you have started a new medication including herbals and non-prescription products.  Coumadin (Warfarin)  Major Drug  Interactions  Increased Warfarin Effect Decreased Warfarin Effect  Alcohol (large quantities) Antibiotics (esp. Septra/Bactrim, Flagyl, Cipro) Amiodarone (Cordarone) Aspirin (ASA) Cimetidine (Tagamet) Megestrol (Megace) NSAIDs (ibuprofen, naproxen, etc.) Piroxicam (Feldene) Propafenone (Rythmol SR) Propranolol (Inderal) Isoniazid (INH) Posaconazole (Noxafil) Barbiturates (Phenobarbital) Carbamazepine (Tegretol) Chlordiazepoxide (Librium) Cholestyramine (Questran) Griseofulvin Oral Contraceptives Rifampin Sucralfate (Carafate) Vitamin K   Coumadin (Warfarin) Major Herbal Interactions  Increased Warfarin Effect Decreased Warfarin Effect  Garlic Ginseng Ginkgo biloba Coenzyme Q10 Green tea St. Johns wort    Coumadin (Warfarin) FOOD Interactions  Eat a consistent number of servings per week of foods HIGH in Vitamin K (1 serving =  cup)  Collards (cooked, or boiled & drained) Kale (cooked, or boiled & drained) Mustard greens (cooked, or boiled & drained) Parsley *serving size only =  cup Spinach (cooked, or boiled & drained) Swiss chard (cooked, or boiled & drained) Turnip greens (cooked, or boiled & drained)  Eat a consistent number of servings per week of foods MEDIUM-HIGH in Vitamin K (1 serving = 1 cup)  Asparagus (cooked, or boiled & drained) Broccoli (cooked, boiled & drained, or raw & chopped) Brussel sprouts (cooked, or boiled & drained) *serving size only =  cup Lettuce, raw (green leaf, endive, romaine) Spinach, raw Turnip greens, raw & chopped   These websites have more information on Coumadin (warfarin):  FailFactory.se; VeganReport.com.au;

## 2016-06-28 NOTE — Progress Notes (Signed)
Physical Therapy Treatment Patient Details Name: Daniel Whitney MRN: [id number] DOB: May 15, 1955 Today's Date: 06/28/2016    History of Present Illness Pt is a 61 y/o male s/p elective L TKA secondary to L knee OA. Extensive PMH including low back pain, CHF, afib, HTN, DM with neuropathy, ESRD on dialysis MWF, and s/p R TKA, R THA, and L AV fistula placement.     PT Comments    Pt continues to be unsteady in standing with tendency for posterior lean and ataxic movement requiring +2 people for safety.  Con't to recommend SNF.   Follow Up Recommendations  SNF     Equipment Recommendations  None recommended by PT    Recommendations for Other Services       Precautions / Restrictions Precautions Precautions: Knee Required Braces or Orthoses: Knee Immobilizer - Left Knee Immobilizer - Left: Other (comment) (until discontinued) Restrictions Weight Bearing Restrictions: Yes LLE Weight Bearing: Weight bearing as tolerated    Mobility  Bed Mobility Overal bed mobility: Needs Assistance Bed Mobility: Supine to Sit     Supine to sit: Min assist     General bed mobility comments: MIn of 1 to get to side of bed with A for trunk  Transfers Overall transfer level: Needs assistance Equipment used: Rolling walker (2 wheeled) Transfers: Sit to/from [facility] Sit to Stand: Max assist;+2 physical assistance Stand pivot transfers: Max assist;+2 physical assistance       General transfer comment: Took 2 attempts to achieve standing with heavy MAX A.  Once in standing, pt needing cues for foot placement to increse steadiness. Pt ataxic and tendency to lean posteriorly.  Pt took 2 steps forward with a turn, but did not feel it was safe to have pt attempt any backwards steps, so recliner brought to him to sit.  Ambulation/Gait             General Gait Details: Ataxic SPT with MAX of 2   Stairs            Wheelchair Mobility    Modified Rankin (Stroke  Patients Only)       Balance   Sitting-balance support: No upper extremity supported;Feet supported Sitting balance-Leahy Scale: Fair     Standing balance support: Bilateral upper extremity supported Standing balance-Leahy Scale: Poor Standing balance comment: Unsteady and needs +2 for safety                            Cognition Arousal/Alertness: Lethargic;Awake/alert (lethargy improved as session went on) Behavior During Therapy: WFL for tasks assessed/performed Overall Cognitive Status: No family/caregiver present to determine baseline cognitive functioning                                 General Comments: Pt very sleepy upon our arrival. Falling asleep after speaking.        Exercises Total Joint Exercises Quad Sets: AROM;Left;10 reps;Supine Heel Slides: AAROM;Left;Supine;5 reps Goniometric ROM: grossly 15-55    General Comments General comments (skin integrity, edema, etc.): Pt became more alert as session progressed.  Therapy opened blinds and turned on lights as well.      Pertinent Vitals/Pain Pain Assessment: Faces Faces Pain Scale: Hurts little more Pain Location: L knee  Pain Descriptors / Indicators: Operative site guarding;Aching;Sore Pain Intervention(s): Limited activity within patient's tolerance;Monitored during session;Repositioned    Home Living  Prior Function            PT Goals (current goals can now be found in the care plan section) Acute Rehab PT Goals Patient Stated Goal: to decrease pain  PT Goal Formulation: With patient Time For Goal Achievement: 07/03/16 Potential to Achieve Goals: Good Progress towards PT goals: Progressing toward goals    Frequency    7X/week      PT Plan Current plan remains appropriate    Co-evaluation              AM-PAC PT "6 Clicks" Daily Activity  Outcome Measure  Difficulty turning over in bed (including adjusting bedclothes, sheets  and blankets)?: Total Difficulty moving from lying on back to sitting on the side of the bed? : Total Difficulty sitting down on and standing up from a chair with arms (e.g., wheelchair, bedside commode, etc,.)?: Total Help needed moving to and from a bed to chair (including a wheelchair)?: A Lot Help needed walking in hospital room?: A Lot Help needed climbing 3-5 steps with a railing? : Total 6 Click Score: 8    End of Session Equipment Utilized During Treatment: Gait belt;Left knee immobilizer Activity Tolerance: Patient tolerated treatment well Patient left: in chair;with call bell/phone within reach Nurse Communication: Mobility status;Other (comment) (need for +2) PT Visit Diagnosis: Other abnormalities of gait and mobility (R26.89);Pain Pain - Right/Left: Left Pain - part of body: Knee     Time: 4742-5956 PT Time Calculation (min) (ACUTE ONLY): 20 min  Charges:  $Therapeutic Activity: 8-22 mins                    G Codes:       Najah Liverman L. Tamala Julian, [place] Pager [phone] 06/28/2016    Galen Manila 06/28/2016, 1:12 PM

## 2016-06-29 DIAGNOSIS — D62 Acute posthemorrhagic anemia: Secondary | ICD-10-CM | POA: Diagnosis not present

## 2016-06-29 DIAGNOSIS — E1122 Type 2 diabetes mellitus with diabetic chronic kidney disease: Secondary | ICD-10-CM | POA: Diagnosis not present

## 2016-06-29 DIAGNOSIS — M6281 Muscle weakness (generalized): Secondary | ICD-10-CM | POA: Diagnosis not present

## 2016-06-29 DIAGNOSIS — E1140 Type 2 diabetes mellitus with diabetic neuropathy, unspecified: Secondary | ICD-10-CM | POA: Diagnosis not present

## 2016-06-29 DIAGNOSIS — R262 Difficulty in walking, not elsewhere classified: Secondary | ICD-10-CM | POA: Diagnosis not present

## 2016-06-29 DIAGNOSIS — Z992 Dependence on renal dialysis: Secondary | ICD-10-CM | POA: Diagnosis not present

## 2016-06-29 DIAGNOSIS — D631 Anemia in chronic kidney disease: Secondary | ICD-10-CM | POA: Diagnosis not present

## 2016-06-29 DIAGNOSIS — M1990 Unspecified osteoarthritis, unspecified site: Secondary | ICD-10-CM | POA: Diagnosis not present

## 2016-06-29 DIAGNOSIS — M109 Gout, unspecified: Secondary | ICD-10-CM | POA: Diagnosis not present

## 2016-06-29 DIAGNOSIS — N186 End stage renal disease: Secondary | ICD-10-CM | POA: Diagnosis not present

## 2016-06-29 DIAGNOSIS — Z96652 Presence of left artificial knee joint: Secondary | ICD-10-CM | POA: Diagnosis not present

## 2016-06-29 DIAGNOSIS — S8990XA Unspecified injury of unspecified lower leg, initial encounter: Secondary | ICD-10-CM | POA: Diagnosis not present

## 2016-06-29 DIAGNOSIS — G4733 Obstructive sleep apnea (adult) (pediatric): Secondary | ICD-10-CM | POA: Diagnosis not present

## 2016-06-29 DIAGNOSIS — I4891 Unspecified atrial fibrillation: Secondary | ICD-10-CM | POA: Diagnosis not present

## 2016-06-29 DIAGNOSIS — K219 Gastro-esophageal reflux disease without esophagitis: Secondary | ICD-10-CM | POA: Diagnosis not present

## 2016-06-29 DIAGNOSIS — Z9189 Other specified personal risk factors, not elsewhere classified: Secondary | ICD-10-CM | POA: Diagnosis not present

## 2016-06-29 DIAGNOSIS — M25569 Pain in unspecified knee: Secondary | ICD-10-CM | POA: Diagnosis not present

## 2016-06-29 DIAGNOSIS — N189 Chronic kidney disease, unspecified: Secondary | ICD-10-CM | POA: Diagnosis not present

## 2016-06-29 DIAGNOSIS — R488 Other symbolic dysfunctions: Secondary | ICD-10-CM | POA: Diagnosis not present

## 2016-06-29 DIAGNOSIS — I480 Paroxysmal atrial fibrillation: Secondary | ICD-10-CM | POA: Diagnosis not present

## 2016-06-29 DIAGNOSIS — Z471 Aftercare following joint replacement surgery: Secondary | ICD-10-CM | POA: Diagnosis not present

## 2016-06-29 DIAGNOSIS — I120 Hypertensive chronic kidney disease with stage 5 chronic kidney disease or end stage renal disease: Secondary | ICD-10-CM | POA: Diagnosis not present

## 2016-06-29 DIAGNOSIS — I10 Essential (primary) hypertension: Secondary | ICD-10-CM | POA: Diagnosis not present

## 2016-06-29 DIAGNOSIS — E1129 Type 2 diabetes mellitus with other diabetic kidney complication: Secondary | ICD-10-CM | POA: Diagnosis not present

## 2016-06-29 DIAGNOSIS — M1712 Unilateral primary osteoarthritis, left knee: Secondary | ICD-10-CM | POA: Diagnosis not present

## 2016-06-29 DIAGNOSIS — T8189XA Other complications of procedures, not elsewhere classified, initial encounter: Secondary | ICD-10-CM | POA: Diagnosis not present

## 2016-06-29 DIAGNOSIS — E11319 Type 2 diabetes mellitus with unspecified diabetic retinopathy without macular edema: Secondary | ICD-10-CM | POA: Diagnosis not present

## 2016-06-29 DIAGNOSIS — R2681 Unsteadiness on feet: Secondary | ICD-10-CM | POA: Diagnosis not present

## 2016-06-29 DIAGNOSIS — G6289 Other specified polyneuropathies: Secondary | ICD-10-CM | POA: Diagnosis not present

## 2016-06-29 DIAGNOSIS — E785 Hyperlipidemia, unspecified: Secondary | ICD-10-CM | POA: Diagnosis not present

## 2016-06-29 DIAGNOSIS — N2581 Secondary hyperparathyroidism of renal origin: Secondary | ICD-10-CM | POA: Diagnosis not present

## 2016-06-29 DIAGNOSIS — Z96653 Presence of artificial knee joint, bilateral: Secondary | ICD-10-CM | POA: Diagnosis not present

## 2016-06-29 DIAGNOSIS — H409 Unspecified glaucoma: Secondary | ICD-10-CM | POA: Diagnosis not present

## 2016-06-29 DIAGNOSIS — R1311 Dysphagia, oral phase: Secondary | ICD-10-CM | POA: Diagnosis not present

## 2016-06-29 LAB — CBC
HCT: 28.7 % — ABNORMAL LOW (ref 39.0–52.0)
HEMOGLOBIN: 8.6 g/dL — AB (ref 13.0–17.0)
MCH: 29.2 pg (ref 26.0–34.0)
MCHC: 30 g/dL (ref 30.0–36.0)
MCV: 97.3 fL (ref 78.0–100.0)
Platelets: 174 10*3/uL (ref 150–400)
RBC: 2.95 MIL/uL — AB (ref 4.22–5.81)
RDW: 16.2 % — ABNORMAL HIGH (ref 11.5–15.5)
WBC: 13.4 10*3/uL — ABNORMAL HIGH (ref 4.0–10.5)

## 2016-06-29 LAB — PROTIME-INR
INR: 1.15
PROTHROMBIN TIME: 14.7 s (ref 11.4–15.2)

## 2016-06-29 LAB — RENAL FUNCTION PANEL
Albumin: 2.6 g/dL — ABNORMAL LOW (ref 3.5–5.0)
Anion gap: 13 (ref 5–15)
BUN: 73 mg/dL — AB (ref 6–20)
CHLORIDE: 91 mmol/L — AB (ref 101–111)
CO2: 26 mmol/L (ref 22–32)
CREATININE: 8.68 mg/dL — AB (ref 0.61–1.24)
Calcium: 8.6 mg/dL — ABNORMAL LOW (ref 8.9–10.3)
GFR calc Af Amer: 7 mL/min — ABNORMAL LOW (ref >60)
GFR calc non Af Amer: 6 mL/min — ABNORMAL LOW (ref >60)
GLUCOSE: 152 mg/dL — AB (ref 65–99)
POTASSIUM: 4.4 mmol/L (ref 3.5–5.1)
Phosphorus: 6 mg/dL — ABNORMAL HIGH (ref 2.5–4.6)
Sodium: 130 mmol/L — ABNORMAL LOW (ref 135–145)

## 2016-06-29 LAB — GLUCOSE, CAPILLARY
GLUCOSE-CAPILLARY: 128 mg/dL — AB (ref 65–99)
Glucose-Capillary: 131 mg/dL — ABNORMAL HIGH (ref 65–99)

## 2016-06-29 LAB — HEPARIN LEVEL (UNFRACTIONATED): Heparin Unfractionated: 0.1 IU/mL — ABNORMAL LOW (ref 0.30–0.70)

## 2016-06-29 MED ORDER — DARBEPOETIN ALFA 100 MCG/0.5ML IJ SOSY
PREFILLED_SYRINGE | INTRAMUSCULAR | Status: AC
  Filled 2016-06-29: qty 0.5

## 2016-06-29 MED ORDER — SODIUM CHLORIDE 0.9 % IV SOLN: 100.0000 mL | INTRAVENOUS | Status: DC | PRN

## 2016-06-29 MED ORDER — LIDOCAINE HCL (PF) 1 % IJ SOLN: 5.0000 mL | INTRAMUSCULAR | Status: DC | PRN

## 2016-06-29 MED ORDER — HEPARIN SODIUM (PORCINE) 1000 UNIT/ML DIALYSIS
1000.0000 [IU] | INTRAMUSCULAR | Status: DC | PRN
  Filled 2016-06-29: qty 1

## 2016-06-29 MED ORDER — PENTAFLUOROPROP-TETRAFLUOROETH EX AERO: 1.0000 "application " | INHALATION_SPRAY | CUTANEOUS | Status: DC | PRN

## 2016-06-29 MED ORDER — LIDOCAINE-PRILOCAINE 2.5-2.5 % EX CREA
1.0000 "application " | TOPICAL_CREAM | CUTANEOUS | Status: DC | PRN
  Filled 2016-06-29: qty 5

## 2016-06-29 NOTE — NC FL2 (Signed)
[place] MEDICAID FL2 LEVEL OF CARE SCREENING TOOL     IDENTIFICATION  Patient Name: Daniel Whitney Birthdate: 12-23-1955 Sex: male Admission Date (Current Location): 06/26/2016  [facility] and [place] Number:  Herbalist and Address:  [facility] [place], [place] [address] [place], [place] [address]      Provider Number: [id number]  Attending Physician Name and Address:  Dorna Leitz, MD  Relative Name and Phone Number:       Current Level of Care: Hospital Recommended Level of Care: [place] Prior Approval Number:    Date Approved/Denied: 06/29/16 PASRR Number: [id number]  Discharge Plan: SNF    Current Diagnoses: Patient Active Problem List   Diagnosis Date Noted  . Primary osteoarthritis of left knee 06/26/2016  . Postoperative anemia due to acute blood loss 01/27/2016  . S/P knee replacement 01/24/2016  . Low back pain 02/09/2015  . Bilateral knee pain 01/27/2015  . Atypical atrial flutter ([place]) 01/24/2015  . Acute on chronic combined systolic and diastolic heart failure ([place]) 01/05/2015  . Hyperkalemia 11/08/2014  . End stage renal disease on dialysis ([place]) 11/08/2014  . Hypocalcemia 11/08/2014  . Recurrent falls 10/13/2014  . Venous stasis ulcer ([place]) 10/13/2014  . Hypoxia   . Leukocytosis   . Respiratory failure ([place])   . ARDS (adult respiratory distress syndrome) ([place])   . Encounter for central line placement   . Cardiopulmonary arrest ([place])   . Compression fracture of L3 lumbar vertebra ([place]) 08/04/2014  . Anemia of chronic renal failure, unspecified stage 07/07/2014  . Allergic rhinitis 07/07/2014  . Right rotator cuff tear 04/27/2014  . SOB (shortness of breath) 01/29/2014  . Acute gouty arthritis 11/11/2013  . Hearing loss in right ear 11/11/2013  . Chronic pain syndrome 11/11/2013  . Right shoulder pain 11/11/2013  . Facial rash 11/01/2013  . Right otitis externa 10/27/2013  . Pulmonary HTN- PA 58 mmHg  04/16/13 09/17/2013  . Right hip pain 08/04/2013  . DOE (dyspnea on exertion) 06/18/2013  . Allergic rhinitis, cause unspecified 06/18/2013  . Carpal tunnel syndrome 06/06/2013  . Leg pain, bilateral 06/06/2013  . Primary localized osteoarthrosis, lower leg 06/04/2013  . Preoperative cardiovascular examination 03/10/2013  . Preventative health care 12/30/2012  . Atrial fibrillation- failed Amiodarone 12/30/2012  . CHF (congestive heart failure) ([place]) 12/30/2012  . Diabetic retinopathy ([place]) 12/30/2012  . Morbid obesity ([place]) 12/30/2012  . OSA (obstructive sleep apnea)- on C-pap 12/30/2012  . Pulmonary hypertension ([place]) 12/30/2012  . Bilateral hearing loss 12/30/2012  . Long term (current) use of anticoagulants 12/30/2012  . Arthritis   . Glaucoma   . Diabetes mellitus with neuropathy ([place])   . Hypertension   . ESRD (end stage renal disease) on dialysis ([place])   . Stroke ([place])   . Hyperlipidemia     Orientation RESPIRATION BLADDER Height & Weight     Self, Time, Situation, Place  O2 (Nasal Cannula 2 L) Continent Weight: 259 lb 0.7 oz (117.5 kg) Height:     BEHAVIORAL SYMPTOMS/MOOD NEUROLOGICAL BOWEL NUTRITION STATUS      Continent Diet (See DC Summary)  AMBULATORY STATUS COMMUNICATION OF NEEDS Skin   Limited Assist Verbally Surgical wounds (Closed Incision Left knee, compression wrap)                       Personal Care Assistance Level of Assistance  Bathing, Feeding, Dressing Bathing Assistance: Limited assistance Feeding assistance: Limited assistance Dressing Assistance: Limited assistance  Functional Limitations Info  Sight, Hearing, Speech Sight Info: Adequate Hearing Info: Adequate Speech Info: Adequate    SPECIAL CARE FACTORS FREQUENCY  PT (By licensed PT), OT (By licensed OT)     PT Frequency: 5xweek OT Frequency: 5xweek            Contractures      Additional Factors Info  Code Status, Allergies Code Status Info: Full Allergies Info:  TOBACCO NICOTIANA TABACUM            Current Medications (06/29/2016):  This is the current hospital active medication list Current Facility-Administered Medications  Medication Dose Route Frequency Provider Last Rate Last Dose  . acetaminophen (TYLENOL) tablet 650 mg  650 mg Oral Q6H PRN Gary Fleet, PA-C   650 mg at 06/28/16 1004   Or  . acetaminophen (TYLENOL) suppository 650 mg  650 mg Rectal Q6H PRN Gary Fleet, PA-C      . allopurinol (ZYLOPRIM) tablet 100 mg  100 mg Oral BID Gary Fleet, PA-C   100 mg at 06/29/16 1248  . alum & mag hydroxide-simeth (MAALOX/MYLANTA) 200-200-20 MG/5ML suspension 30 mL  30 mL Oral Q4H PRN Gary Fleet, PA-C      . amitriptyline (ELAVIL) tablet 50 mg  50 mg Oral QHS Gary Fleet, PA-C   50 mg at 06/28/16 2215  . bisacodyl (DULCOLAX) EC tablet 5 mg  5 mg Oral Daily PRN Gary Fleet, PA-C      . carvedilol (COREG) tablet 25 mg  25 mg Oral 2 times per day on Mon Wed Sat Gary Fleet, PA-C   25 mg at 06/27/16 2126  . carvedilol (COREG) tablet 25 mg  25 mg Oral Once per day on Sun Tue Thu Graves, John, MD   25 mg at 06/28/16 1010  . Darbepoetin Alfa (ARANESP) injection 100 mcg  100 mcg Intravenous Q Fri-HD Loren Racer, PA-C   100 mcg at 06/29/16 1052  . dextromethorphan-guaiFENesin (MUCINEX DM) 30-600 MG per 12 hr tablet 1 tablet  1 tablet Oral BID Gary Fleet, PA-C   1 tablet at 06/29/16 1248  . diphenhydrAMINE (BENADRYL) 12.5 MG/5ML elixir 12.5-25 mg  12.5-25 mg Oral Q4H PRN Gary Fleet, PA-C      . docusate sodium (COLACE) capsule 100 mg  100 mg Oral BID Gary Fleet, PA-C   100 mg at 06/29/16 1247  . feeding supplement (PRO-STAT SUGAR FREE 64) liquid 30 mL  30 mL Oral BID Gary Fleet, PA-C   30 mL at 06/29/16 1248  . gabapentin (NEURONTIN) capsule 300 mg  300 mg Oral BID Gary Fleet, PA-C   300 mg at 06/29/16 1246  . heparin ADULT infusion 100 units/mL (25000 units/216mL sodium chloride 0.45%)  2,000 Units/hr Intravenous  Continuous Laren Everts, RPH 20 mL/hr at 06/29/16 0109 2,000 Units/hr at 06/29/16 3235  . HYDROmorphone (DILAUDID) injection 0.5-1 mg  0.5-1 mg Intravenous Q3H PRN Gary Fleet, PA-C   1 mg at 06/26/16 2211  . magnesium citrate solution 1 Bottle  1 Bottle Oral Once PRN Gary Fleet, PA-C      . methocarbamol (ROBAXIN) tablet 500 mg  500 mg Oral Q6H PRN Gary Fleet, PA-C   500 mg at 06/29/16 0534   Or  . methocarbamol (ROBAXIN) 500 mg in dextrose 5 % 50 mL IVPB  500 mg Intravenous Q6H PRN Gary Fleet, PA-C      . multivitamin with minerals tablet 1 tablet  1 tablet Oral Daily Gary Fleet, PA-C   1 tablet at 06/29/16  1247  . ondansetron (ZOFRAN) tablet 4 mg  4 mg Oral Q6H PRN Gary Fleet, PA-C       Or  . ondansetron [facility]) injection 4 mg  4 mg Intravenous Q6H PRN Gary Fleet, PA-C      . oxyCODONE (Oxy IR/ROXICODONE) immediate release tablet 5-10 mg  5-10 mg Oral Q3H PRN Gary Fleet, PA-C   10 mg at 06/29/16 1246  . polyethylene glycol (MIRALAX / GLYCOLAX) packet 17 g  17 g Oral Daily PRN Gary Fleet, PA-C      . sevelamer carbonate (RENVELA) tablet 800 mg  800 mg Oral TID WC Stephania Fragmin R, PA-C   800 mg at 06/29/16 1246  . simvastatin (ZOCOR) tablet 20 mg  20 mg Oral Daily Gary Fleet, PA-C   20 mg at 06/29/16 1247  . warfarin (COUMADIN) tablet 3 mg  3 mg Oral Once per day on Sun Mon Wed Fri Sat Gary Fleet, PA-C   3 mg at 06/27/16 1031  . warfarin (COUMADIN) tablet 3.5 mg  3.5 mg Oral Once per day on Tue Thu Bethune, James, PA-C   3.5 mg at 06/28/16 1849  . Warfarin - Physician Dosing Inpatient   Does not apply q1800 Dorna Leitz, MD         Discharge Medications: Please see discharge summary for a list of discharge medications.  Relevant Imaging Results:  Relevant Lab Results:   Additional Information [id number]; Dialysis [facility] [place]  Normajean Baxter, LCSW

## 2016-06-29 NOTE — Social Work (Signed)
Clinical Social Worker facilitated patient discharge including contacting patient family and facility to confirm patient discharge plans.  Clinical information faxed to facility and family agreeable with plan.  CSW arranged ambulance transport via [facility] to [place].  RN to call [phone] report prior to discharge.  Clinical Social Worker will sign off for now as social work intervention is no longer needed. Please consult [place] again if new need  Elissa Hefty, [place] Social Worker [phone]

## 2016-06-29 NOTE — Progress Notes (Signed)
  [facility] Progress Note   Subjective:  On HD, no c/o's.    Objective Vitals:   06/29/16 1130 06/29/16 1200 06/29/16 1338 06/29/16 1506  BP: (!) 120/53 130/68 (!) 108/57 (!) 113/49  Pulse: 83 84 85 82  Resp:  18 18 16   Temp:  98 F (36.7 C) 98.7 F (37.1 C) 98.6 F (37 C)  TempSrc:  Oral  Axillary  SpO2:  100% 100% 97%  Weight:  117.5 kg (259 lb 0.7 oz)     Physical Exam General: Well appearing, NAD. Heart: RRR; no murmur Lungs:  CTAB Extremities: No LE edema, bandaged L knee, no LE edema Dialysis Access: LUE AVF + thrill   Dialysis Orders: MWF at [facility] 4 hr, BFR 400, DFR 800, EDW 111kg, 2K/2.5Ca bath, AVF, linear, heparin 4400 bolus - Mircera 6mcg IV q 2 weeks (last given 5/4) - Hectoral 52mcg IV weekly - Venofer 50mg  IV weekly  Assessment: 1. S/p L TKR (5/15 by Dr. Berenice Primas): Per ortho. 2. ESRD: Continue MWF schedule, next 5/18. 3. Hypertension/volume: BP fine. Keep same EDW/meds for now. Wt's not accurate 4. Anemia: Hgb 9.2. Restart ESA tomorrow (Aranesp 140mcg) 5. Metabolic bone disease: Ca ok, Continue Renvela 1/meals for now as binder.  Plan - HD today UF as tolerated.   Kelly Splinter MD [employer] pgr [phone]   06/29/2016, 5:12 PM       Basic Metabolic Panel:  Recent Labs Lab 06/26/16 0739 06/27/16 0709 06/29/16 0813  NA 137 131* 130*  K 4.4 5.7* 4.4  CL  --  96* 91*  CO2  --  22 26  GLUCOSE 125* 224* 152*  BUN  --  56* 73*  CREATININE  --  9.15* 8.68*  CALCIUM  --  9.5 8.6*  PHOS  --   --  6.0*   CBC:  Recent Labs Lab 06/27/16 0709 06/28/16 0538 06/29/16 0428  WBC 16.0* 13.8* 13.4*  HGB 9.8* 9.2* 8.6*  HCT 32.6* 29.4* 28.7*  MCV 95.9 97.4 97.3  PLT 180 159 174   Medications: . heparin Stopped (06/29/16 1637)  . methocarbamol (ROBAXIN)  IV     . allopurinol  100 mg Oral BID  . amitriptyline  50 mg Oral QHS  . carvedilol  25 mg Oral 2 times per day on Mon Wed Sat  .  carvedilol  25 mg Oral Once per day on Sun Tue Thu  . darbepoetin (ARANESP) injection - DIALYSIS  100 mcg Intravenous Q Fri-HD  . dextromethorphan-guaiFENesin  1 tablet Oral BID  . docusate sodium  100 mg Oral BID  . feeding supplement (PRO-STAT SUGAR FREE 64)  30 mL Oral BID  . gabapentin  300 mg Oral BID  . multivitamin with minerals  1 tablet Oral Daily  . sevelamer carbonate  800 mg Oral TID WC  . simvastatin  20 mg Oral Daily  . warfarin  3 mg Oral Once per day on Sun Mon Wed Fri Sat  . warfarin  3.5 mg Oral Once per day on Tue Thu  . Warfarin - Physician Dosing Inpatient   Does not apply q1800    Veneta Penton, PA-C 06/29/2016, 5:10 PM  [facility] Pager: [phone]  Pt seen, examined and agree w A/P as above. For prob dc to SNF tomorrow. Resuming anticoag with coumadin/ lovenox.  Kelly Splinter MD [employer] pager [phone]   06/29/2016, 5:10 PM

## 2016-06-29 NOTE — Progress Notes (Signed)
ANTICOAGULATION CONSULT NOTE - Follow Up Consult  Pharmacy Consult for heparin Indication: atrial fibrillation  Labs:  Recent Labs  06/27/16 0709 06/28/16 0538 06/28/16 1812 06/29/16 0428  HGB 9.8* 9.2*  --  8.6*  HCT 32.6* 29.4*  --  28.7*  PLT 180 159  --  174  LABPROT 14.5 15.1  --  14.7  INR 1.13 1.18  --  1.15  HEPARINUNFRC  --   --  0.12* 0.10*  CREATININE 9.15*  --   --   --      Assessment: 61yo male remains below goal on heparin with lower level despite rate increase; RN reports no gtt issues.  Goal of Therapy:  Heparin level 0.3-0.7 units/ml   Plan:  Will increase heparin gtt by 3 units/kg/hr to 2000 units/hr and check level in [place], PharmD, BCPS  06/29/2016,6:32 AM

## 2016-06-29 NOTE — Clinical Social Work Placement (Signed)
   CLINICAL SOCIAL WORK PLACEMENT  NOTE  Date:  06/29/2016  Patient Details  Name: Daniel Whitney MRN: [id number] Date of Birth: Mar 03, 1955  Clinical Social Work is seeking post-discharge placement for this patient at the [place] level of care (*CSW will initial, date and re-position this form in  chart as items are completed):  Yes   Patient/family provided with [facility] Work Department's list of facilities offering this level of care within the geographic area requested by the patient (or if unable, by the patient's family).  Yes   Patient/family informed of their freedom to choose among providers that offer the needed level of care, that participate in Medicare, Medicaid or managed care program needed by the patient, have an available bed and are willing to accept the patient.  Yes   Patient/family informed of [facility]'s ownership interest in [facility] and [facility], as well as of the fact that they are under no obligation to receive care at these facilities.  PASRR submitted to EDS on       PASRR number received on 06/29/16     Existing PASRR number confirmed on 06/29/16     FL2 transmitted to all facilities in geographic area requested by pt/family on 06/29/16     FL2 transmitted to all facilities within larger geographic area on 06/29/16     Patient informed that his/her managed care company has contracts with or will negotiate with certain facilities, including the following:        Yes   Patient/family informed of bed offers received.  Patient chooses bed at [facility] recommends and patient chooses bed at      Patient to be transferred to [facility] on 06/29/16.  Patient to be transferred to facility by [facility]     Patient family notified on 06/29/16 of transfer.  Name of family member notified:  Pt indicated he will f/u with family on his own.      PHYSICIAN Please prepare priority discharge summary, including medications, Please prepare prescriptions, Please sign FL2     Additional Comment:    _______________________________________________ Normajean Baxter, LCSW 06/29/2016, 3:55 PM

## 2016-06-29 NOTE — Progress Notes (Signed)
Subjective: 3 Days Post-Op Procedure(s) (LRB): TOTAL KNEE ARTHROPLASTY (Left) Patient reports pain as moderate. The patient is receiving hemodialysis this morning. He denies dizziness, shortness of breath, or chest pain. He has moderate left knee pain. He has made slow progress with physical therapy.   Objective: Vital signs in last 24 hours: Temp:  [98.2 F (36.8 C)-99.6 F (37.6 C)] 99 F (37.2 C) (05/18 0454) Pulse Rate:  [79-91] 81 (05/18 0454) Resp:  [20] 20 (05/18 0454) BP: (87-158)/(58-88) 118/85 (05/18 0454) SpO2:  [94 %-100 %] 100 % (05/18 0454)  Intake/Output from previous day: 05/17 0701 - 05/18 0700 In: 1055.4 [P.O.:600; I.V.:455.4] Out: 0  Intake/Output this shift: No intake/output data recorded.   Recent Labs  06/27/16 0709 06/28/16 0538 06/29/16 0428  HGB 9.8* 9.2* 8.6*    Recent Labs  06/28/16 0538 06/29/16 0428  WBC 13.8* 13.4*  RBC 3.02* 2.95*  HCT 29.4* 28.7*  PLT 159 174    Recent Labs  06/27/16 0709  NA 131*  K 5.7*  CL 96*  CO2 22  BUN 56*  CREATININE 9.15*  GLUCOSE 224*  CALCIUM 9.5    Recent Labs  06/28/16 0538 06/29/16 0428  INR 1.18 1.15  Left knee exam: Dressing is clean and dry. He has moderate left knee swelling with mild effusion. His left calf is soft and nontender. Moves foot actively. Normal sensation distally. Distal pulses are 1+. Patient is somewhat somnolent but arousable.   Assessment/Plan: 3 Days Post-Op Procedure(s) (LRB): TOTAL KNEE ARTHROPLASTY (Left) End stage renal disease. On hemodialysis. Acute blood loss anemia, stable. Expected. Plan: Finish up hemodialysis this morning. Continue physical therapy weightbearing as tolerated on left lower extremity. Coumadin for DVT prophylaxis which he takes chronically for cardiac reasons. Discharge to skilled nursing facility today. Weightbearing as tolerated on left. Follow-up with Dr. Berenice Primas in the office in 2 weeks.   [place] G 06/29/2016, 8:14  AM

## 2016-06-29 NOTE — Progress Notes (Signed)
ANTICOAGULATION CONSULT NOTE - Follow Up Consult  Pharmacy Consult for heparin Indication: atrial fibrillation  Labs:  Recent Labs  06/27/16 0709 06/28/16 0538 06/28/16 1812 06/29/16 0428 06/29/16 0813 06/29/16 1418  HGB 9.8* 9.2*  --  8.6*  --   --   HCT 32.6* 29.4*  --  28.7*  --   --   PLT 180 159  --  174  --   --   LABPROT 14.5 15.1  --  14.7  --   --   INR 1.13 1.18  --  1.15  --   --   HEPARINUNFRC  --   --  0.12* 0.10*  --  <0.10*  CREATININE 9.15*  --   --   --  8.68*  --      Assessment: 61yo male with undetectable heparin level this afternoon.  Noted patient discharging this evening- confirmed with RN Catalina Antigua that patient is leaving- Ride will be here at 1800 per report.   Goal of Therapy:  Heparin level 0.3-0.7 units/ml   Plan:  Heparin to be turned off pending discharge. No orders entered so as to not affect completed AVS  [place]ford Barg D. Nathanael Krist, PharmD, BCPS Clinical Pharmacist

## 2016-06-29 NOTE — Progress Notes (Signed)
Physical Therapy Treatment Patient Details Name: Daniel Whitney MRN: [id number] DOB: October 06, 1955 Today's Date: 06/29/2016    History of Present Illness Pt is a 61 y/o male s/p elective L TKA secondary to L knee OA. Extensive PMH including low back pain, CHF, afib, HTN, DM with neuropathy, ESRD on dialysis MWF, and s/p R TKA, R THA, and L AV fistula placement.     PT Comments    Patient experiencing increased knee pain today limiting activity. Performed sit<>stand 3x with max assist +2 from elevated surface. Cueing for technique and hand placement. Max A +2 for stand pivot transfer to chair. Pt unable to ambulate or tolerate standing >5 seconds due to pain. Patient would benefit from continued skilled PT. Will continue to follow acutely.    Follow Up Recommendations  SNF     Equipment Recommendations  None recommended by PT    Recommendations for Other Services       Precautions / Restrictions Precautions Precautions: Knee Precaution Booklet Issued: Yes (comment) Required Braces or Orthoses: Knee Immobilizer - Left Knee Immobilizer - Left: Other (comment) (until discontinued) Restrictions Weight Bearing Restrictions: Yes LLE Weight Bearing: Weight bearing as tolerated    Mobility  Bed Mobility Overal bed mobility: Needs Assistance Bed Mobility: Supine to Sit     Supine to sit: Min assist     General bed mobility comments: Min a for LLE management. Increase time, elevated HOB, and bed rails required to elevate trunk.  Transfers Overall transfer level: Needs assistance Equipment used: Rolling walker (2 wheeled) Transfers: Sit to/from [facility] Sit to Stand: Max assist;+2 physical assistance (x3) Stand pivot transfers: Max assist;+2 physical assistance       General transfer comment: Max A +2 to transfer sit<>stand. Performed 3x. Pt unable to attempt ambulation after standing due to increased LLE pain.  Max A +2 for stand pivot transfer with pad under  hips. Pt able to assist with descend to chair.  Ambulation/Gait             General Gait Details: Unable to take any steps once standing due to increased knee pain. Pt reports it is worse today than yesterday.   Stairs            Wheelchair Mobility    Modified Rankin (Stroke Patients Only)       Balance Overall balance assessment: Needs assistance Sitting-balance support: No upper extremity supported;Feet supported Sitting balance-Leahy Scale: Fair Sitting balance - Comments: Slight posterior lean in sitting. Able to self correct.  Postural control: Posterior lean Standing balance support: Bilateral upper extremity supported Standing balance-Leahy Scale: Poor Standing balance comment: Unsteady and needs +2 for safety                            Cognition Arousal/Alertness: Awake/alert (lethargy improved as session went on) Behavior During Therapy: WFL for tasks assessed/performed Overall Cognitive Status: No family/caregiver present to determine baseline cognitive functioning                                 General Comments: Pt very sleepy upon our arrival. Falling asleep after speaking.        Exercises      General Comments        Pertinent Vitals/Pain Pain Assessment: 0-10 Pain Score: 7  Pain Location: L knee  Pain Descriptors / Indicators: Operative site guarding;Aching;Sore Pain Intervention(s):  Limited activity within patient's tolerance;Monitored during session;Repositioned    Home Living                      Prior Function            PT Goals (current goals can now be found in the care plan section) Acute Rehab PT Goals Patient Stated Goal: to decrease pain  PT Goal Formulation: With patient Time For Goal Achievement: 07/03/16 Potential to Achieve Goals: Good Progress towards PT goals: Progressing toward goals    Frequency    7X/week      PT Plan Current plan remains appropriate     Co-evaluation              AM-PAC PT "6 Clicks" Daily Activity  Outcome Measure  Difficulty turning over in bed (including adjusting bedclothes, sheets and blankets)?: Total Difficulty moving from lying on back to sitting on the side of the bed? : Total Difficulty sitting down on and standing up from a chair with arms (e.g., wheelchair, bedside commode, etc,.)?: Total Help needed moving to and from a bed to chair (including a wheelchair)?: A Lot Help needed walking in hospital room?: A Lot Help needed climbing 3-5 steps with a railing? : Total 6 Click Score: 8    End of Session Equipment Utilized During Treatment: Gait belt;Left knee immobilizer Activity Tolerance: Patient tolerated treatment well Patient left: in chair;with call bell/phone within reach   PT Visit Diagnosis: Other abnormalities of gait and mobility (R26.89);Pain Pain - Right/Left: Left Pain - part of body: Knee     Time: 1439-1500 PT Time Calculation (min) (ACUTE ONLY): 21 min  Charges:  $Therapeutic Activity: 8-22 mins                    G Codes:       Benjiman Core, PTA Acute Rehab [place] 06/29/2016, 3:40 PM

## 2016-06-29 NOTE — Discharge Summary (Signed)
Patient ID: Daniel Whitney MRN: [id number] DOB/AGE: 1955-06-30 61 y.o.  Admit date: 06/26/2016 Discharge date: 06/29/2016  Admission Diagnoses:  Principal Problem:   Primary osteoarthritis of left knee Active Problems:   Diabetes mellitus with neuropathy (HCC)   ESRD (end stage renal disease) on dialysis (HCC)   Postoperative anemia due to acute blood loss   Discharge Diagnoses:  Same  Past Medical History:  Diagnosis Date  . Allergic rhinitis 07/07/2014  . Anemia due to other cause 07/07/2014  . Arthritis    "all over"  . Atrial fibrillation ([place]) 12/30/2012  . CHF (congestive heart failure) ([place]) 12/30/2012  . Chronic lower back pain   . Diabetes mellitus with neuropathy ([place])   . Diabetic retinopathy ([place]) 12/30/2012   no medications  . Dysrhythmia    Afib  . ESRD (end stage renal disease) on dialysis ([facility]) started in 2013   MWF; [facility]; [employer]  . GERD (gastroesophageal reflux disease)   . Glaucoma   . History of gout    "right big toe"  . Hyperlipidemia   . Hypertension   . Hypothyroid    01/17/16- no longer on meduication  . Morbid obesity ([place]) 12/30/2012  . Myocardial infarction ([place])    "I've had ~ 3; last one was in ~ 10/2013" (01/30/2014)  . OSA (obstructive sleep apnea) 12/30/2012   "lost weight; no longer needed CPAP; retested said I needed it; didn't followup cause I was feeling fine" (01/30/2014)  . Pulmonary hypertension ([place]) 12/30/2012  . Refusal of blood transfusions as patient is Jehovah's Witness   . Stroke [facility]) ~ 2005; ~ 2005   "they were mild; I didn't even notice I'd had them"; denies residual on 01/30/2014    Surgeries: Procedure(s):Left TOTAL KNEE ARTHROPLASTY on 06/26/2016   Consultants: Treatment Team:  Roney Jaffe, MD  Discharged Condition: Improved  Hospital Course: Daniel Whitney is an 61 y.o. male who was admitted 06/26/2016 for operative treatment ofPrimary osteoarthritis of left knee. Patient has severe unremitting pain  that affects sleep, daily activities, and work/hobbies. After pre-op clearance the patient was taken to the operating room on 06/26/2016 and underwent  Procedure(s): Left TOTAL KNEE ARTHROPLASTY.    Patient was given perioperative antibiotics: Anti-infectives    Start     Dose/Rate Route Frequency Ordered Stop   06/27/16 0800  ceFAZolin (ANCEF) IVPB 1 g/50 mL premix     1 g 100 mL/hr over 30 Minutes Intravenous Every 24 hours 06/26/16 1126 06/27/16 1136   06/26/16 1330  ceFAZolin (ANCEF) IVPB 2g/100 mL premix  Status:  Discontinued     2 g 200 mL/hr over 30 Minutes Intravenous Every 6 hours 06/26/16 1057 06/26/16 1126   06/26/16 0641  ceFAZolin (ANCEF) 2-4 GM/100ML-% IVPB    Comments:  Maryjean Ka   : cabinet override      06/26/16 0641 06/26/16 0749   06/26/16 0639  ceFAZolin (ANCEF) IVPB 2g/100 mL premix     2 g 200 mL/hr over 30 Minutes Intravenous On call to O.R. 06/26/16 7829 06/26/16 0749       Patient was given sequential compression devices, early ambulation, and chemoprophylaxis to prevent DVT.The patient was resumed postoperatively on his preoperative Coumadin. Initially Lovenox was used and then pharmacy switch the patient to heparin. The heparin was discontinued on the date of discharge. The patient had hemodialysis on Wednesday and Friday. No complications were noted. He made slow progress with physical therapy.  Patient benefited maximally from hospital stay and there were no complications.  Recent vital signs: Patient Vitals for the past 24 hrs:  BP Temp Temp src Pulse Resp SpO2  06/29/16 0804 (!) 108/43 - - 85 - -  06/29/16 0759 (!) 120/56 98.6 F (37 C) Oral 80 18 100 %  06/29/16 0454 118/85 99 F (37.2 C) Oral 81 20 100 %  06/28/16 2041 128/64 98.8 F (37.1 C) Oral 91 - 96 %  06/28/16 1538 (!) 111/58 - - - - -  06/28/16 1351 (!) 87/63 99.6 F (37.6 C) Oral 79 - 94 %  06/28/16 0900 (!) 158/88 98.2 F (36.8 C) Oral 87 - 95 %     Recent laboratory studies:   Recent Labs  06/27/16 0709 06/28/16 0538 06/29/16 0428  WBC 16.0* 13.8* 13.4*  HGB 9.8* 9.2* 8.6*  HCT 32.6* 29.4* 28.7*  PLT 180 159 174  NA 131*  --   --   K 5.7*  --   --   CL 96*  --   --   CO2 22  --   --   BUN 56*  --   --   CREATININE 9.15*  --   --   GLUCOSE 224*  --   --   INR 1.13 1.18 1.15  CALCIUM 9.5  --   --      Discharge Medications:   Allergies as of 06/29/2016      Reactions   Tobacco [nicotiana Tabacum] Shortness Of Breath      Medication List    STOP taking these medications   enoxaparin 100 MG/ML injection Commonly known as:  LOVENOX     TAKE these medications   acetaminophen 650 MG CR tablet Commonly known as:  TYLENOL Take 650 mg by mouth every 8 (eight) hours as needed for pain.   allopurinol 100 MG tablet Commonly known as:  ZYLOPRIM Take 1 tablet (100 mg total) by mouth 2 (two) times daily.   amitriptyline 50 MG tablet Commonly known as:  ELAVIL Take 1 tablet (50 mg total) by mouth at bedtime.   carvedilol 25 MG tablet Commonly known as:  COREG Take 1 tablet by mouth two times daily on Monday, Wednesday, and Saturday. Give 1 tablet by mouth every Tuesday, Thursday, and Sunday   docusate sodium 100 MG capsule Commonly known as:  COLACE Take 100 mg by mouth 2 (two) times daily.   feeding supplement (PRO-STAT SUGAR FREE 64) Liqd Take 30 mLs by mouth 2 (two) times daily.   GOLD BOND MEDICATED ANTI ITCH EX Apply to skin twice a day for itching.   MUCINEX DM 30-600 MG Tb12 Take 1 tablet by mouth 2 (two) times daily.   multivitamin with minerals Tabs tablet Take 1 tablet by mouth daily.   oxyCODONE-acetaminophen 5-325 MG tablet Commonly known as:  PERCOCET/ROXICET Take 1-2 tablets by mouth every 4 (four) hours as needed for severe pain. What changed:  how much to take  when to take this   senna 8.6 MG tablet Commonly known as:  SENOKOT Take 1 tablet by mouth daily.   simvastatin 20 MG tablet Commonly known as:   ZOCOR Take 1 tablet (20 mg total) by mouth daily.   sodium chloride 0.65 % Soln nasal spray Commonly known as:  OCEAN Place 1 spray into both nostrils as needed for congestion.   warfarin 1 MG tablet Commonly known as:  COUMADIN Give 1/2 tablet along with 3 mg tablet to equal 3.5 mg on Tuesday and Thursday.   warfarin 3 MG tablet Commonly known  as:  COUMADIN Take 1 tablet (3 mg total) by mouth daily. Mon, Wed, Fri, Sat and Sun       Diagnostic Studies: No results found.  Disposition: 01-Home or Self Care  Discharge Instructions    CPM    Complete by:  As directed    Continuous passive motion machine (CPM):      Use the CPM from 0 to 60 for 8 hours per day.      You may increase by 5-10 per day.  You may break it up into 2 or 3 sessions per day.      Use CPM for 1-2 weeks or until you are told to stop.   Call MD / Call 911    Complete by:  As directed    If you experience chest pain or shortness of breath, CALL 911 and be transported to the hospital emergency room.  If you develope a fever above 101 F, pus (white drainage) or increased drainage or redness at the wound, or calf pain, call your surgeon's office.   Constipation Prevention    Complete by:  As directed    Drink plenty of fluids.  Prune juice may be helpful.  You may use a stool softener, such as Colace (over the counter) 100 mg twice a day.  Use MiraLax (over the counter) for constipation as needed.   Diet general    Complete by:  As directed    Do not put a pillow under the knee. Place it under the heel.    Complete by:  As directed    Increase activity slowly as tolerated    Complete by:  As directed    Weight bearing as tolerated    Complete by:  As directed    Laterality:  left   Extremity:  Lower    The patient will need hemodialysis on Monday, Wednesday, and Friday. This is his usual schedule.  Follow-up Information    Dorna Leitz, MD. Schedule an appointment as soon as possible for a visit in 2  weeks.   Specialty:  Orthopedic Surgery Contact information: [place] [address] [phone]            Signed: Erlene Senters 06/29/2016, 8:22 AM

## 2016-06-29 NOTE — Progress Notes (Signed)
Attempted calling [facility] facility to give report, the Agricultural engineer would not connect to the nurse because there was no record of the room number the patient would be transferring to. She instructed staff to call back after he was admitted.  Multiple attempts were made to call [facility] facility for report, but there was never an answer.

## 2016-06-29 NOTE — Clinical Social Work Note (Signed)
Clinical Social Work Assessment  Patient Details  Name: Daniel Whitney MRN: [id number] Date of Birth: [birth year]  Date of referral:  06/29/16               Reason for consult:  Facility Placement                Permission sought to share information with:  Facility Art therapist granted to share information::  Yes, Verbal Permission Granted  Name::        Agency::  SNF  Relationship::     Contact Information:     Housing/Transportation Living arrangements for the past 2 months:  Single Family Home Source of Information:  Patient Patient Interpreter Needed:  None Criminal Activity/Legal Involvement Pertinent to Current Situation/Hospitalization:  No - Comment as needed Significant Relationships:  Other Family Members Lives with:  Self Do you feel safe going back to the place where you live?  No Need for family participation in patient care:  No (Coment)  Care giving concerns: Patient resided alone prior to hospitalization. He is not safe to return home at this time as he has no support at home.  Social Worker assessment / plan:  CSW met with patient this day to dicscuss DC recommendations from his clinical team.  CSW introduces self and role. CSW discussed SNF options and placement. Patient indicated that he has experience with SNF in the past. Patient indicated that he wanted to return to [facility]. CSW will f/u with [facility] and obtained permission to send out to SNFs in the surrounding area.  FL2 complete. SNF offers sent out.  Employment status:  Disabled (Comment on whether or not currently receiving Disability) Insurance information:  Medicare PT Recommendations:  [place] / Referral to community resources:  [place]  Patient/Family's Response to care:  Patient appreciative with SNF recommendations at DC. No issues or concnerns reported.  Patient/Family's Understanding of and Emotional Response to Diagnosis,  Current Treatment, and Prognosis:  Patient has a good understanding of diagnosis, current treatment and prognosis.  He is confident that rehabilitation therapy will help stabilize his impairment. No issues reported at this time.  Emotional Assessment Appearance:  Appears stated age Attitude/Demeanor/Rapport:   (Cooperative) Affect (typically observed):  Accepting, Appropriate Orientation:  Oriented to Self, Oriented to Place, Oriented to  Time, Oriented to Situation Alcohol / Substance use:  Not Applicable Psych involvement (Current and /or in the community):  No (Comment)  Discharge Needs  Concerns to be addressed:  Care Coordination Readmission within the last 30 days:  No Current discharge risk:  Physical Impairment, Dependent with Mobility Barriers to Discharge:  No Barriers Identified   Normajean Baxter, LCSW 06/29/2016, 3:52 PM

## 2016-07-02 DIAGNOSIS — E1129 Type 2 diabetes mellitus with other diabetic kidney complication: Secondary | ICD-10-CM | POA: Diagnosis not present

## 2016-07-02 DIAGNOSIS — Z992 Dependence on renal dialysis: Secondary | ICD-10-CM | POA: Diagnosis not present

## 2016-07-02 DIAGNOSIS — N186 End stage renal disease: Secondary | ICD-10-CM | POA: Diagnosis not present

## 2016-07-02 DIAGNOSIS — N2581 Secondary hyperparathyroidism of renal origin: Secondary | ICD-10-CM | POA: Diagnosis not present

## 2016-07-02 DIAGNOSIS — D631 Anemia in chronic kidney disease: Secondary | ICD-10-CM | POA: Diagnosis not present

## 2016-07-03 ENCOUNTER — Encounter: Payer: Self-pay | Admitting: Internal Medicine

## 2016-07-03 ENCOUNTER — Non-Acute Institutional Stay (SKILLED_NURSING_FACILITY): Payer: Medicare Other | Admitting: Internal Medicine

## 2016-07-03 DIAGNOSIS — N186 End stage renal disease: Secondary | ICD-10-CM | POA: Diagnosis not present

## 2016-07-03 DIAGNOSIS — Z9189 Other specified personal risk factors, not elsewhere classified: Secondary | ICD-10-CM | POA: Diagnosis not present

## 2016-07-03 DIAGNOSIS — Z96653 Presence of artificial knee joint, bilateral: Secondary | ICD-10-CM | POA: Diagnosis not present

## 2016-07-03 DIAGNOSIS — I10 Essential (primary) hypertension: Secondary | ICD-10-CM | POA: Diagnosis not present

## 2016-07-03 DIAGNOSIS — Z992 Dependence on renal dialysis: Secondary | ICD-10-CM | POA: Diagnosis not present

## 2016-07-03 DIAGNOSIS — G4733 Obstructive sleep apnea (adult) (pediatric): Secondary | ICD-10-CM

## 2016-07-03 NOTE — Assessment & Plan Note (Signed)
PT/OT at [facility] Outpatient follow-up with Dr. Berenice Primas first week of June

## 2016-07-03 NOTE — Assessment & Plan Note (Addendum)
07/03/16 patient states that he is not on CPAP High risk of respiratory suppression with narcotics postop

## 2016-07-03 NOTE — Progress Notes (Signed)
MRN: [id number]  Facility Location: [facility]  Room Number: 213  Code Status: Full Code   PCP: [name], [address] [place] [id number]   This is a comprehensive admission note to [facility] performed on this date less than 30 days from date of admission. Included are preadmission medical/surgical history;reconciled medication list; family history; social history and comprehensive review of systems.  Corrections and additions to the records were documented . Comprehensive physical exam was also performed. Additionally a clinical summary was entered for each active diagnosis pertinent to this admission in the Problem List to enhance continuity of care.   HPI: The patient was hospitalized 5/15-5/18/18 for end-stage osteoarthritis of the knee. Left total knee arthroplasty was performed 5/15 by Dr. Dorna Leitz. Patient had no postoperative complications. Most current labs on 5/18 revealed creatinine of 8.68, BUN 73, GFR of 7, hemoglobin 8.6/hematocrit 28.7.  Past medical and surgical history: He states that he has obstructive sleep apnea but never completed the testing for CPAP machine. The patient is on hemodialysis on Mondays, Wednesdays and Fridays. Significantly patient is Jehovah's Witness and has refused blood transfusions. He has a history of pulmonary hypertension, stroke, obstructive sleep apnea, myocardial infarction, hypothyroidism, dyslipidemia, gout, and atrial fibrillation. He had a right total knee arthroplasty 01/24/16. Total hip arthroplasty was performed on the right 1980.  Social history: 1 pint of liquor 2-3 times per week. Non smoker  Family history: Reviewed  Review of systems: He describes some itchy eyes without other extrinsic symptoms. He also has excessive thirst. He describes a production of sputum on occasion. He has numbness and tingling of the feet which is chronic. Constitutional: No  fever,significant weight change, fatigue  Eyes: No redness, discharge, pain, vision change ENT/mouth: No nasal congestion,  purulent discharge, earache,change in hearing ,sore throat  Cardiovascular: No chest pain, palpitations,paroxysmal nocturnal dyspnea, claudication, edema  Respiratory: No hemoptysis, DOE  Gastrointestinal: No heartburn,dysphagia,abdominal pain, nausea / vomiting,rectal bleeding, melena,change in bowels Genitourinary: No dysuria,hematuria, pyuria,  incontinence, nocturia Musculoskeletal: No joint stiffness, joint swelling, weakness,pain Dermatologic: No rash, pruritus, change in appearance of skin Neurologic: No dizziness,headache,syncope, seizures Psychiatric: No significant anxiety , depression, insomnia, anorexia Endocrine: No change in hair/skin/ nails,  excessive hunger, excessive urination  Hematologic/lymphatic: No significant bruising, lymphadenopathy,abnormal bleeding Allergy/immunology: No significant sneezing, urticaria, angioedema  Physical exam:  Pertinent or positive findings: Sitting in W/C.The patient is profoundly lethargic, falling asleep in mid sentence. With this his head and neck will hyperextend dramatically with mouth wide open. He has a lower partial. Heart sounds are decreased. Rhythm is difficult to discern because the distant quality of the heart sounds. Breath sounds are also distant. Abdomen is protuberant. He has scattered bland excoriations over the abdomen. He has tense edema of lower extremities without pitting. Pedal pulses are decreased.Soft brace LLE   General appearance:Adequately nourished; no acute distress , increased work of breathing is present.   Lymphatic: No lymphadenopathy about the head, neck, axilla . Eyes: No conjunctival inflammation or lid edema is present. There is no scleral icterus. Ears:  External ear exam shows no significant lesions or deformities.   Nose:  External nasal examination shows no deformity or  inflammation. Nasal mucosa are pink and moist without lesions ,exudates Oral exam: lips and gums are healthy appearing.There is no oropharyngeal erythema or exudate . Neck:  No thyromegaly, masses, tenderness noted.    Heart:  No gallop, murmur, click, rub .  Lungs:  without wheezes, rhonchi,rales , rubs. Abdomen:Bowel sounds are normal. Abdomen is soft and nontender with no organomegaly, hernias,masses. GU: deferred  Extremities:  No cyanosis, clubbing  Neurologic exam : Strength equal  in upper extremities Balance,Rhomberg,finger to nose testing could not be completed due to clinical state Skin: Warm & dry w/o tenting. No significant lesions or rash.  See clinical summary under each active problem in the Problem List with associated updated therapeutic plan

## 2016-07-03 NOTE — Patient Instructions (Signed)
See assessment and plan under each diagnosis in the problem list and acutely for this visit 

## 2016-07-03 NOTE — Assessment & Plan Note (Signed)
Add low-dose amlodipine and titrate dose as needed

## 2016-07-03 NOTE — Assessment & Plan Note (Addendum)
Narcotic dose adjusted due to risk of respiratory suppression & interval since TKR (5/15)

## 2016-07-03 NOTE — Assessment & Plan Note (Signed)
Continue dialysis Monday, Wednesday, and Friday 

## 2016-07-04 DIAGNOSIS — E1129 Type 2 diabetes mellitus with other diabetic kidney complication: Secondary | ICD-10-CM | POA: Diagnosis not present

## 2016-07-04 DIAGNOSIS — N2581 Secondary hyperparathyroidism of renal origin: Secondary | ICD-10-CM | POA: Diagnosis not present

## 2016-07-04 DIAGNOSIS — Z992 Dependence on renal dialysis: Secondary | ICD-10-CM | POA: Diagnosis not present

## 2016-07-04 DIAGNOSIS — D631 Anemia in chronic kidney disease: Secondary | ICD-10-CM | POA: Diagnosis not present

## 2016-07-04 DIAGNOSIS — N186 End stage renal disease: Secondary | ICD-10-CM | POA: Diagnosis not present

## 2016-07-05 ENCOUNTER — Other Ambulatory Visit: Payer: Self-pay | Admitting: *Deleted

## 2016-07-05 NOTE — Patient Outreach (Signed)
[facility]) Care Management  07/05/2016  Daniel Whitney October 29, 1955 [id number]   Attempted to meet with patient at facility, patient not in facility, still out at HD per nurse.   Plan to attempt to visit at next facility visit.  Royetta Crochet. Laymond Purser, RN, BSN, [place] [phone]) Business Cell  [phone]) Toll Free Office

## 2016-07-06 DIAGNOSIS — N186 End stage renal disease: Secondary | ICD-10-CM | POA: Diagnosis not present

## 2016-07-06 DIAGNOSIS — Z992 Dependence on renal dialysis: Secondary | ICD-10-CM | POA: Diagnosis not present

## 2016-07-06 DIAGNOSIS — E1129 Type 2 diabetes mellitus with other diabetic kidney complication: Secondary | ICD-10-CM | POA: Diagnosis not present

## 2016-07-06 DIAGNOSIS — D631 Anemia in chronic kidney disease: Secondary | ICD-10-CM | POA: Diagnosis not present

## 2016-07-06 DIAGNOSIS — N2581 Secondary hyperparathyroidism of renal origin: Secondary | ICD-10-CM | POA: Diagnosis not present

## 2016-07-06 LAB — POCT INR: INR: 1.7 — AB (ref 0.9–1.1)

## 2016-07-06 LAB — PROTIME-INR: PROTIME: 20.1 s — AB (ref 10.0–13.8)

## 2016-07-09 DIAGNOSIS — Z992 Dependence on renal dialysis: Secondary | ICD-10-CM | POA: Diagnosis not present

## 2016-07-09 DIAGNOSIS — E1129 Type 2 diabetes mellitus with other diabetic kidney complication: Secondary | ICD-10-CM | POA: Diagnosis not present

## 2016-07-09 DIAGNOSIS — N2581 Secondary hyperparathyroidism of renal origin: Secondary | ICD-10-CM | POA: Diagnosis not present

## 2016-07-09 DIAGNOSIS — N186 End stage renal disease: Secondary | ICD-10-CM | POA: Diagnosis not present

## 2016-07-09 DIAGNOSIS — D631 Anemia in chronic kidney disease: Secondary | ICD-10-CM | POA: Diagnosis not present

## 2016-07-09 LAB — PROTIME-INR: PROTIME: 34.2 — AB (ref 10.0–13.8)

## 2016-07-09 LAB — POCT INR: INR: 3.4 — AB (ref 0.9–1.1)

## 2016-07-10 ENCOUNTER — Ambulatory Visit (INDEPENDENT_AMBULATORY_CARE_PROVIDER_SITE_OTHER): Payer: Medicare Other | Admitting: Pharmacist

## 2016-07-10 DIAGNOSIS — M1712 Unilateral primary osteoarthritis, left knee: Secondary | ICD-10-CM | POA: Diagnosis not present

## 2016-07-10 DIAGNOSIS — I480 Paroxysmal atrial fibrillation: Secondary | ICD-10-CM

## 2016-07-10 DIAGNOSIS — Z96652 Presence of left artificial knee joint: Secondary | ICD-10-CM | POA: Diagnosis not present

## 2016-07-10 DIAGNOSIS — Z7901 Long term (current) use of anticoagulants: Secondary | ICD-10-CM

## 2016-07-10 DIAGNOSIS — Z471 Aftercare following joint replacement surgery: Secondary | ICD-10-CM | POA: Diagnosis not present

## 2016-07-10 LAB — POCT INR: INR: 2.9 — AB (ref 0.9–1.1)

## 2016-07-10 LAB — PROTIME-INR: Protime: 30 — AB (ref 10.0–13.8)

## 2016-07-11 ENCOUNTER — Other Ambulatory Visit: Payer: Self-pay | Admitting: *Deleted

## 2016-07-11 DIAGNOSIS — N2581 Secondary hyperparathyroidism of renal origin: Secondary | ICD-10-CM | POA: Diagnosis not present

## 2016-07-11 DIAGNOSIS — Z992 Dependence on renal dialysis: Secondary | ICD-10-CM | POA: Diagnosis not present

## 2016-07-11 DIAGNOSIS — D631 Anemia in chronic kidney disease: Secondary | ICD-10-CM | POA: Diagnosis not present

## 2016-07-11 DIAGNOSIS — E1129 Type 2 diabetes mellitus with other diabetic kidney complication: Secondary | ICD-10-CM | POA: Diagnosis not present

## 2016-07-11 DIAGNOSIS — N186 End stage renal disease: Secondary | ICD-10-CM | POA: Diagnosis not present

## 2016-07-11 NOTE — Patient Outreach (Signed)
[facility] ([facility]) Care Management  07/11/2016  Daniel Whitney 04/07/1955 [id number]   Met with patient at bedside of facility.  Patient reports he has HD on M,W,F, the early shift. He states he may have been out of room for an activity last week.  Patient has history of 2 admits in 6 months, patient has history of ESRD (on HD), HF, Diabetes, Afib, anemia, OSA.  Patient states he is working on getting independence back, he lives in a condo with his brother. The condo has 13 steps going to 2nd floor bedroom. He has a plan B to get an acorn chair lift if he cannot get back to where he can climb stairs.  Patient states he still drives but his brother can also transport if needed.  Patient states he has had some medication assistance from "[employer]" (states it is some form of a pension plan) but this year something changed and his medications became expensive. He states he was finally told he did not have a Part D plan. So his medications are running $200-$300.  He is able to manage his medications and uses a medication planner.  Patient states he has a scale, CBG machine, BP cuff.   RNCM reviewed [facility] care management services. Patient wants to review packet. He thinks he would like to participate but wants to consider it and review information. He does not have discharge date but thinks it will be at least 2 more weeks as he is just getting to standing.   Plan to visit again at next weeks facility visit and review [facility] program again and if patient agrees will refer to [facility] community care management.   Mary E. Niemczura, RN, BSN, CCM  Post Acute Care Coordinator [facility] ([phone]) Business Cell  ([phone]) Toll Free Office 

## 2016-07-12 DIAGNOSIS — Z992 Dependence on renal dialysis: Secondary | ICD-10-CM | POA: Diagnosis not present

## 2016-07-12 DIAGNOSIS — I120 Hypertensive chronic kidney disease with stage 5 chronic kidney disease or end stage renal disease: Secondary | ICD-10-CM | POA: Diagnosis not present

## 2016-07-12 DIAGNOSIS — N186 End stage renal disease: Secondary | ICD-10-CM | POA: Diagnosis not present

## 2016-07-13 DIAGNOSIS — N2581 Secondary hyperparathyroidism of renal origin: Secondary | ICD-10-CM | POA: Diagnosis not present

## 2016-07-13 DIAGNOSIS — D631 Anemia in chronic kidney disease: Secondary | ICD-10-CM | POA: Diagnosis not present

## 2016-07-13 DIAGNOSIS — Z992 Dependence on renal dialysis: Secondary | ICD-10-CM | POA: Diagnosis not present

## 2016-07-13 DIAGNOSIS — E1129 Type 2 diabetes mellitus with other diabetic kidney complication: Secondary | ICD-10-CM | POA: Diagnosis not present

## 2016-07-13 DIAGNOSIS — N186 End stage renal disease: Secondary | ICD-10-CM | POA: Diagnosis not present

## 2016-07-13 LAB — POCT INR: INR: 2.1 — AB (ref 0.9–1.1)

## 2016-07-13 LAB — PROTIME-INR: PROTIME: 23.7 s — AB (ref 10.0–13.8)

## 2016-07-16 DIAGNOSIS — Z992 Dependence on renal dialysis: Secondary | ICD-10-CM | POA: Diagnosis not present

## 2016-07-16 DIAGNOSIS — D631 Anemia in chronic kidney disease: Secondary | ICD-10-CM | POA: Diagnosis not present

## 2016-07-16 DIAGNOSIS — E1129 Type 2 diabetes mellitus with other diabetic kidney complication: Secondary | ICD-10-CM | POA: Diagnosis not present

## 2016-07-16 DIAGNOSIS — N186 End stage renal disease: Secondary | ICD-10-CM | POA: Diagnosis not present

## 2016-07-16 DIAGNOSIS — N2581 Secondary hyperparathyroidism of renal origin: Secondary | ICD-10-CM | POA: Diagnosis not present

## 2016-07-17 ENCOUNTER — Other Ambulatory Visit: Payer: Self-pay | Admitting: *Deleted

## 2016-07-17 NOTE — Patient Outreach (Signed)
[facility]) Care Management  07/17/2016  Daniel Whitney 11-09-1955 [id number]   Met with patient again at facility. Patient states he has reviewed [facility] packet and feels he could possibly benefit from [facility] care management. He states he will be at facility a couple more weeks, requests RNCM visit closer to discharge and he will confirm participation at that time.   Plan to visit again next week, review packet and verify patient enrollment for participation with [facility] care management.  Royetta Crochet. Laymond Purser, RN, BSN, [place] [phone]) Business Cell  [phone]) Toll Free Office

## 2016-07-18 DIAGNOSIS — D631 Anemia in chronic kidney disease: Secondary | ICD-10-CM | POA: Diagnosis not present

## 2016-07-18 DIAGNOSIS — Z992 Dependence on renal dialysis: Secondary | ICD-10-CM | POA: Diagnosis not present

## 2016-07-18 DIAGNOSIS — N186 End stage renal disease: Secondary | ICD-10-CM | POA: Diagnosis not present

## 2016-07-18 DIAGNOSIS — E1129 Type 2 diabetes mellitus with other diabetic kidney complication: Secondary | ICD-10-CM | POA: Diagnosis not present

## 2016-07-18 DIAGNOSIS — N2581 Secondary hyperparathyroidism of renal origin: Secondary | ICD-10-CM | POA: Diagnosis not present

## 2016-07-20 DIAGNOSIS — E1129 Type 2 diabetes mellitus with other diabetic kidney complication: Secondary | ICD-10-CM | POA: Diagnosis not present

## 2016-07-20 DIAGNOSIS — N186 End stage renal disease: Secondary | ICD-10-CM | POA: Diagnosis not present

## 2016-07-20 DIAGNOSIS — Z992 Dependence on renal dialysis: Secondary | ICD-10-CM | POA: Diagnosis not present

## 2016-07-20 DIAGNOSIS — D631 Anemia in chronic kidney disease: Secondary | ICD-10-CM | POA: Diagnosis not present

## 2016-07-20 DIAGNOSIS — N2581 Secondary hyperparathyroidism of renal origin: Secondary | ICD-10-CM | POA: Diagnosis not present

## 2016-07-20 LAB — POCT INR: INR: 2.5 — AB (ref 0.9–1.1)

## 2016-07-20 LAB — PROTIME-INR: Protime: 26.8 — AB (ref 10.0–13.8)

## 2016-07-23 DIAGNOSIS — T8189XA Other complications of procedures, not elsewhere classified, initial encounter: Secondary | ICD-10-CM | POA: Diagnosis not present

## 2016-07-23 DIAGNOSIS — E1129 Type 2 diabetes mellitus with other diabetic kidney complication: Secondary | ICD-10-CM | POA: Diagnosis not present

## 2016-07-23 DIAGNOSIS — Z992 Dependence on renal dialysis: Secondary | ICD-10-CM | POA: Diagnosis not present

## 2016-07-23 DIAGNOSIS — N2581 Secondary hyperparathyroidism of renal origin: Secondary | ICD-10-CM | POA: Diagnosis not present

## 2016-07-23 DIAGNOSIS — D631 Anemia in chronic kidney disease: Secondary | ICD-10-CM | POA: Diagnosis not present

## 2016-07-23 DIAGNOSIS — N186 End stage renal disease: Secondary | ICD-10-CM | POA: Diagnosis not present

## 2016-07-24 ENCOUNTER — Other Ambulatory Visit: Payer: Self-pay | Admitting: *Deleted

## 2016-07-24 NOTE — Patient Outreach (Signed)
[facility]) Care Management  07/24/2016  [name] [birth year] [id number]   Attempted to meet with patient at facility. He was not at facility, reported by staff that he had left with his brother for a day pass, they anticipated him to be back at lunch but he did not return as of yet. He is due a therapy session. They expect patient to discharge in the next week or so.  Plan to attempt to visit with patient at a later date.   Royetta Crochet. Laymond Purser, RN, BSN, [place] [phone]) Business Cell  [phone]) Toll Free Office

## 2016-07-25 ENCOUNTER — Other Ambulatory Visit: Payer: Self-pay | Admitting: *Deleted

## 2016-07-25 DIAGNOSIS — E1129 Type 2 diabetes mellitus with other diabetic kidney complication: Secondary | ICD-10-CM | POA: Diagnosis not present

## 2016-07-25 DIAGNOSIS — N2581 Secondary hyperparathyroidism of renal origin: Secondary | ICD-10-CM | POA: Diagnosis not present

## 2016-07-25 DIAGNOSIS — I5022 Chronic systolic (congestive) heart failure: Secondary | ICD-10-CM

## 2016-07-25 DIAGNOSIS — D631 Anemia in chronic kidney disease: Secondary | ICD-10-CM | POA: Diagnosis not present

## 2016-07-25 DIAGNOSIS — Z992 Dependence on renal dialysis: Secondary | ICD-10-CM | POA: Diagnosis not present

## 2016-07-25 DIAGNOSIS — N186 End stage renal disease: Secondary | ICD-10-CM | POA: Diagnosis not present

## 2016-07-25 NOTE — Patient Outreach (Signed)
[facility]) Care Management  07/25/2016  Daniel Whitney Aug 18, 1955 [id number]   Met with patient at facility.  Patient hopes to go home soon, was hoping for Friday or early next week.  Patient agrees to [facility] care management services. Voicing he can use the additional support.  He states he does not have a drug plan and has some very expensive medications.  Patient states he goes to HD on M, W, F on the early shift.   Plan to refer patient to [place] and [place]. Consent obtained. [facility] Packet and [facility] calendar left with patient. RNCM reviewed tools in packet for HF zones and documenting BP and weights and blood sugars.   Royetta Crochet. Laymond Purser, RN, BSN, [place] [phone]) Business Cell  [phone]) Toll Free Office

## 2016-07-26 ENCOUNTER — Non-Acute Institutional Stay (SKILLED_NURSING_FACILITY): Payer: Medicare Other | Admitting: Internal Medicine

## 2016-07-26 ENCOUNTER — Encounter: Payer: Self-pay | Admitting: Internal Medicine

## 2016-07-26 DIAGNOSIS — G629 Polyneuropathy, unspecified: Secondary | ICD-10-CM | POA: Insufficient documentation

## 2016-07-26 DIAGNOSIS — G6289 Other specified polyneuropathies: Secondary | ICD-10-CM | POA: Diagnosis not present

## 2016-07-26 LAB — POCT INR: INR: 2.6 — AB (ref 0.9–1.1)

## 2016-07-26 LAB — PROTIME-INR: Protime: 27 — AB (ref 10.0–13.8)

## 2016-07-26 NOTE — Progress Notes (Signed)
   NURSING HOME LOCATION:  [facility] ROOM NUMBER:  [id number]  CODE STATUS:  Full Code  PCP:  [name], MD [place] [address]  This is a nursing facility follow up of chronic medical diagnosis of peripheral neuropathy which is progressing.  Interim medical record and care since last [place] visit was updated with review of diagnostic studies and change in clinical status since last visit were documented.  HPI: Patient has had neuropathy for "a couple of years". It is now progressing and involving the balls and toes of his feet. He describes "squeezing, burning, pins and needles, and stabbing". It's almost a "vice grip" and constant. It is worse with walking. He denies any claudication type symptoms. Amitriptyline had been of benefit but the dose was decreased because of repeated falling. Since the dose was decreased he has not fallen but the neuropathy has progressed. Gabapentin 100 mg twice a day initially was of some benefit but not at this time. He denies any other neuromuscular symptoms. He is on hemodialysis Monday, Wednesday, and Friday. His creatinine has been as high as 9.5 and GFR as low as 6.  Review of systems: Constitutional: No fever,significant weight change, fatigue  Musculoskeletal: L knee pain improved Dermatologic: No rash, pruritus, change in appearance of skin Neurologic: No dizziness,headache,syncope, seizures  Physical exam:  Pertinent or positive findings: He has trace-1/2+ edema at the sock line. The left dorsalis pulse is palpable but the others are decreased especially posterior tibial pulses. Toenails are thickened and somewhat hyperpigmented. Knee reflexes are 1/2+. There is fusiform changes on the left. The right foot is cool to touch without ischemic changes.  General appearance:Adequately nourished; no acute distress , increased work of breathing is present.   Extremities:  No cyanosis, clubbing    Neurologic exam : Strength equal  in upper & lower extremities Deep tendon reflexes are equal Skin: Warm & dry w/o tenting. No significant lesions or rash.  See summary under each active problem in the Problem List with associated updated therapeutic plan

## 2016-07-26 NOTE — Assessment & Plan Note (Signed)
Gabapentin 200 mg once daily with 300 mg after dialysis Monday, Wednesday, and Friday

## 2016-07-27 ENCOUNTER — Encounter: Payer: Self-pay | Admitting: Internal Medicine

## 2016-07-27 DIAGNOSIS — N186 End stage renal disease: Secondary | ICD-10-CM | POA: Diagnosis not present

## 2016-07-27 DIAGNOSIS — E1129 Type 2 diabetes mellitus with other diabetic kidney complication: Secondary | ICD-10-CM | POA: Diagnosis not present

## 2016-07-27 DIAGNOSIS — N2581 Secondary hyperparathyroidism of renal origin: Secondary | ICD-10-CM | POA: Diagnosis not present

## 2016-07-27 DIAGNOSIS — Z992 Dependence on renal dialysis: Secondary | ICD-10-CM | POA: Diagnosis not present

## 2016-07-27 DIAGNOSIS — D631 Anemia in chronic kidney disease: Secondary | ICD-10-CM | POA: Diagnosis not present

## 2016-07-27 NOTE — Patient Instructions (Signed)
See assessment and plan under diagnosis in the problem list  acutely for this visit

## 2016-07-30 DIAGNOSIS — E1129 Type 2 diabetes mellitus with other diabetic kidney complication: Secondary | ICD-10-CM | POA: Diagnosis not present

## 2016-07-30 DIAGNOSIS — D631 Anemia in chronic kidney disease: Secondary | ICD-10-CM | POA: Diagnosis not present

## 2016-07-30 DIAGNOSIS — T8189XA Other complications of procedures, not elsewhere classified, initial encounter: Secondary | ICD-10-CM | POA: Diagnosis not present

## 2016-07-30 DIAGNOSIS — N186 End stage renal disease: Secondary | ICD-10-CM | POA: Diagnosis not present

## 2016-07-30 DIAGNOSIS — Z992 Dependence on renal dialysis: Secondary | ICD-10-CM | POA: Diagnosis not present

## 2016-07-30 DIAGNOSIS — N2581 Secondary hyperparathyroidism of renal origin: Secondary | ICD-10-CM | POA: Diagnosis not present

## 2016-07-31 ENCOUNTER — Non-Acute Institutional Stay (SKILLED_NURSING_FACILITY): Payer: Medicare Other | Admitting: Adult Health

## 2016-07-31 ENCOUNTER — Encounter: Payer: Self-pay | Admitting: Adult Health

## 2016-07-31 DIAGNOSIS — G6289 Other specified polyneuropathies: Secondary | ICD-10-CM

## 2016-07-31 DIAGNOSIS — M109 Gout, unspecified: Secondary | ICD-10-CM

## 2016-07-31 DIAGNOSIS — D631 Anemia in chronic kidney disease: Secondary | ICD-10-CM | POA: Diagnosis not present

## 2016-07-31 DIAGNOSIS — M1712 Unilateral primary osteoarthritis, left knee: Secondary | ICD-10-CM

## 2016-07-31 DIAGNOSIS — Z992 Dependence on renal dialysis: Secondary | ICD-10-CM | POA: Diagnosis not present

## 2016-07-31 DIAGNOSIS — I480 Paroxysmal atrial fibrillation: Secondary | ICD-10-CM

## 2016-07-31 DIAGNOSIS — R2681 Unsteadiness on feet: Secondary | ICD-10-CM | POA: Diagnosis not present

## 2016-07-31 DIAGNOSIS — N189 Chronic kidney disease, unspecified: Secondary | ICD-10-CM | POA: Diagnosis not present

## 2016-07-31 DIAGNOSIS — Z96652 Presence of left artificial knee joint: Secondary | ICD-10-CM | POA: Diagnosis not present

## 2016-07-31 DIAGNOSIS — I10 Essential (primary) hypertension: Secondary | ICD-10-CM | POA: Diagnosis not present

## 2016-07-31 DIAGNOSIS — N186 End stage renal disease: Secondary | ICD-10-CM

## 2016-07-31 DIAGNOSIS — E785 Hyperlipidemia, unspecified: Secondary | ICD-10-CM | POA: Diagnosis not present

## 2016-07-31 DIAGNOSIS — Z471 Aftercare following joint replacement surgery: Secondary | ICD-10-CM | POA: Diagnosis not present

## 2016-07-31 MED ORDER — ALLOPURINOL 100 MG PO TABS: 100.0000 mg | ORAL_TABLET | Freq: Two times a day (BID) | ORAL | 0 refills | Status: DC

## 2016-07-31 MED ORDER — SIMVASTATIN 20 MG PO TABS: 20.0000 mg | ORAL_TABLET | Freq: Every day | ORAL | 0 refills | Status: DC

## 2016-07-31 MED ORDER — WARFARIN SODIUM 5 MG PO TABS: 5.0000 mg | ORAL_TABLET | Freq: Every day | ORAL | 0 refills | Status: DC

## 2016-07-31 MED ORDER — AMLODIPINE BESYLATE 2.5 MG PO TABS: 2.5000 mg | ORAL_TABLET | Freq: Every day | ORAL | 0 refills | Status: DC

## 2016-07-31 MED ORDER — GABAPENTIN 100 MG PO CAPS: 200.0000 mg | ORAL_CAPSULE | Freq: Every day | ORAL | 0 refills | Status: AC

## 2016-07-31 MED ORDER — AMITRIPTYLINE HCL 50 MG PO TABS: 50.0000 mg | ORAL_TABLET | Freq: Every day | ORAL | 0 refills | Status: DC

## 2016-07-31 MED ORDER — FERRIC CITRATE 1 GM 210 MG(FE) PO TABS: 210.0000 mg | ORAL_TABLET | Freq: Three times a day (TID) | ORAL | 0 refills | Status: DC

## 2016-07-31 MED ORDER — CARVEDILOL 25 MG PO TABS: 25.0000 mg | ORAL_TABLET | ORAL | 0 refills | Status: DC

## 2016-07-31 MED ORDER — SILVER SULFADIAZINE 1 % EX CREA: 1.0000 "application " | TOPICAL_CREAM | Freq: Every day | CUTANEOUS | 0 refills | Status: DC

## 2016-07-31 NOTE — Progress Notes (Addendum)
DATE:  07/31/2016   MRN:  [id number]  BIRTHDAY: 13-Jan-1956  Facility:  Nursing Home Location:  [facility] Room Number: 210  LEVEL OF CARE:  SNF [phone])  Contact Information    Name Relation Home Work Mobile   Heatley,[name] Brother [phone]  [phone]   Atwell, Mcdanel [phone]  [phone]       Code Status History    Date Active Date Inactive Code Status Order ID Comments User Context   06/26/2016 10:57 AM 06/29/2016  9:10 PM Full Code [id number]  Elmon Else Inpatient   01/24/2016 12:29 PM 01/27/2016  8:36 PM Full Code [id number]  Dorna Leitz, MD Inpatient   01/07/2015 11:19 AM 01/08/2015  7:50 PM Full Code [id number]  Troy Sine, MD Inpatient   01/03/2015  8:20 PM 01/07/2015 11:19 AM Full Code [id number]  Germain Osgood, PA-C ED   11/08/2014  4:11 PM 11/08/2014 11:34 PM Full Code [id number]  Willia Craze, NP ED   08/13/2014  4:15 AM 08/31/2014  9:24 PM Full Code [id number]  Corey Harold, NP ED   01/29/2014 10:30 AM 01/31/2014 10:06 PM Full Code [id number]  Reyne Dumas, MD ED       Chief Complaint  Patient presents with  . Discharge Note    HISTORY OF PRESENT ILLNESS:  This is a 45-YO male seen for discharge.  He will discharge home on 08/02/2016 with home health PT, OT and Nursing for medication management and wound care. He was seen in his room today and requested for his neurontin to be given @ HS only.   He has been admitted to [place] on 06/29/16 from [facility] admission dates 06/26/16 thru 06/29/16 for end-stageOsteoarthritis of the knee S/P left total knee arthroplasty on 06/26/16. He has PMH of ESRD on hemodialysis, CHF,DM with neuropathy, atrial fibrillation, HTN, hypothyroidism, hx of MI and stroke.  Patient was admitted to this facility for short-term rehabilitation after the patient's recent hospitalization.  Patient has completed SNF rehabilitation and therapy has cleared  the patient for discharge.   PAST MEDICAL HISTORY:  Past Medical History:  Diagnosis Date  . Allergic rhinitis 07/07/2014  . Anemia due to other cause 07/07/2014  . Arthritis    "all over"  . Atrial fibrillation ([place]) 12/30/2012  . CHF (congestive heart failure) ([place]) 12/30/2012  . Chronic lower back pain   . Diabetes mellitus with neuropathy ([place])   . Diabetic retinopathy ([place]) 12/30/2012   no medications  . Dysrhythmia    Afib  . ESRD (end stage renal disease) on dialysis ([place]) started in 2013   MWF; [facility]; [employer]  . GERD (gastroesophageal reflux disease)   . Glaucoma   . History of gout    "right big toe"  . Hyperlipidemia   . Hypertension   . Hypothyroid    01/17/16- no longer on meduication  . Morbid obesity ([place]) 12/30/2012  . Myocardial infarction ([place])    "I've had ~ 3; last one was in ~ 10/2013" (01/30/2014)  . OSA (obstructive sleep apnea) 12/30/2012   "lost weight; no longer needed CPAP; retested said I needed it; didn't followup cause I was feeling fine" (01/30/2014)  . Pulmonary hypertension ([place]) 12/30/2012  . Refusal of blood transfusions as patient is Jehovah's Witness   . Stroke [facility]) ~ 2005; ~ 2005   "they were mild; I didn't even notice I'd had them"; denies residual on 01/30/2014     CURRENT MEDICATIONS:  Reviewed  Patient's Medications  New Prescriptions   No medications on file  Previous Medications   ACETAMINOPHEN (TYLENOL) 650 MG CR TABLET    Take 650 mg by mouth every 8 (eight) hours as needed for pain.   ALLOPURINOL (ZYLOPRIM) 100 MG TABLET    Take 1 tablet (100 mg total) by mouth 2 (two) times daily.   AMINO ACIDS-PROTEIN HYDROLYS (FEEDING SUPPLEMENT, PRO-STAT SUGAR FREE 64,) LIQD    Take 30 mLs by mouth 2 (two) times daily.   AMITRIPTYLINE (ELAVIL) 50 MG TABLET    Take 1 tablet (50 mg total) by mouth at bedtime.   AMLODIPINE (NORVASC) 2.5 MG TABLET    Take 2.5 mg by mouth daily.   B COMPLEX-VITAMIN C-FOLIC ACID (NEPHRO-VITE) 0.8 MG  TABS TABLET    Take 1 tablet by mouth daily.   CARVEDILOL (COREG) 25 MG TABLET    Take 25 mg by mouth. Give BID on M-W-Sat, qd on Tue-Thu-Sun   DEXTROMETHORPHAN-GUAIFENESIN (MUCINEX DM) 30-600 MG TB12    Take 1 tablet by mouth 2 (two) times daily.   DOCUSATE SODIUM (COLACE) 100 MG CAPSULE    Take 100 mg by mouth 2 (two) times daily.   FERRIC CITRATE (AURYXIA) 1 GM 210 MG(FE) TABLET    Take 210 mg by mouth 3 (three) times daily with meals.   GABAPENTIN (NEURONTIN) 100 MG CAPSULE    Take 200-300 mg by mouth. 200 mg qam and 300 mg upon return from dialysis on M-W-F for neuropathy   OXYCODONE-ACETAMINOPHEN (PERCOCET/ROXICET) 5-325 MG TABLET    Take by mouth every 8 (eight) hours as needed for severe pain.   OXYGEN    Inhale 2 L/min into the lungs as needed (To keep O2 sats above 90%).   PRAMOXINE-MENTHOL-DIMETHICONE (GOLD BOND MEDICATED ANTI ITCH EX)    Apply to skin twice a day for itching.   SENNOSIDES-DOCUSATE SODIUM (SENOKOT-S) 8.6-50 MG TABLET    Take 1 tablet by mouth daily.   SIMVASTATIN (ZOCOR) 20 MG TABLET    Take 1 tablet (20 mg total) by mouth daily.   SODIUM CHLORIDE (OCEAN) 0.65 % SOLN NASAL SPRAY    Place 1 spray into both nostrils as needed for congestion.   WARFARIN (COUMADIN) 5 MG TABLET    Take 5 mg by mouth daily.  Modified Medications   No medications on file  Discontinued Medications   CARVEDILOL (COREG) 25 MG TABLET    Take 1 tablet by mouth two times daily on Monday, Wednesday, and Saturday. Give 1 tablet by mouth every Tuesday, Thursday, and Sunday   MULTIPLE VITAMINS-MINERALS (MULTIVITAMIN WITH MINERALS) TABLET    Take 1 tablet by mouth daily.   SENNA (SENOKOT) 8.6 MG TABLET    Take 1 tablet by mouth daily.      Allergies  Allergen Reactions  . Tobacco [Nicotiana Tabacum] Shortness Of Breath     REVIEW OF SYSTEMS:  GENERAL: no change in appetite, no fatigue, no weight changes, no fever, chills or weakness EYES: Denies change in vision, dry eyes, eye pain, itching  or discharge EARS: Denies change in hearing, ringing in ears, or earache NOSE: Denies nasal congestion or epistaxis MOUTH and THROAT: Denies oral discomfort, gingival pain or bleeding, pain from teeth or hoarseness   RESPIRATORY: no cough, SOB, DOE, wheezing, hemoptysis CARDIAC: no chest pain, edema or palpitations GI: no abdominal pain, diarrhea, constipation, heart burn, nausea or vomiting GU: Denies dysuria, frequency, hematuria, incontinence, or discharge PSYCHIATRIC: Denies feeling of depression or anxiety. No  report of hallucinations, insomnia, paranoia, or agitation     PHYSICAL EXAMINATION  GENERAL APPEARANCE: Well nourished. In no acute distress. Obese SKIN:  Left knee surgical wound has an open wound on anterior portion, dry and no erythema, covered with dressing HEAD: Normal in size and contour. No evidence of trauma EYES: Lids open and close normally. No blepharitis, entropion or ectropion. PERRL. Conjunctivae are clear and sclerae are white. Lenses are without opacity EARS: Pinnae are normal. Patient hears normal voice tunes of the examiner MOUTH and THROAT: Lips are without lesions. Oral mucosa is moist and without lesions. Tongue is normal in shape, size, and color and without lesions NECK: supple, trachea midline, no neck masses, no thyroid tenderness, no thyromegaly LYMPHATICS: no LAN in the neck, no supraclavicular LAN RESPIRATORY: breathing is even & unlabored, BS CTAB CARDIAC: RRR, no murmur,no extra heart sounds, no edema, left forearm AV Fistula + bruit/thrill GI: abdomen soft, normal BS, no masses, no tenderness, no hepatomegaly, no splenomegaly EXTREMITIES:  Able to move 4 extremities PSYCHIATRIC: Alert and oriented X 3. Affect and behavior are appropriate   LABS/RADIOLOGY: Labs reviewed: Basic Metabolic Panel:  Recent Labs  01/25/16 0347  01/27/16 1030  06/19/16 1004 06/26/16 0739 06/27/16 0709 06/29/16 0813  NA 134*  133*  < >  --   < > 135 137  131* 130*  K 5.6*  5.6*  < >  --   < > 4.9 4.4 5.7* 4.4  CL 95*  94*  < >  --   < > 97*  --  96* 91*  CO2 21*  21*  < >  --   --  24  --  22 26  GLUCOSE 145*  145*  < >  --   < > 105* 125* 224* 152*  BUN 49*  50*  < >  --   < > 46*  --  56* 73*  CREATININE 9.53*  9.53*  < >  --   < > 7.65*  --  9.15* 8.68*  CALCIUM 9.3  9.2  < >  --   --  9.2  --  9.5 8.6*  PHOS 8.2*  --  8.3*  --   --   --   --  6.0*  < > = values in this interval not displayed. Liver Function Tests:  Recent Labs  01/17/16 1047  01/27/16 1030 06/19/16 1004 06/29/16 0813  AST 17  --   --  17  --   ALT 23  --   --  15*  --   ALKPHOS 93  --   --  108  --   BILITOT 0.7  --   --  0.6  --   PROT 7.1  --   --  7.7  --   ALBUMIN 3.2*  < > 2.6* 3.6 2.6*  < > = values in this interval not displayed. CBC:  Recent Labs  01/17/16 1047  06/19/16 1004  06/27/16 0709 06/28/16 0538 06/29/16 0428  WBC 7.6  < > 7.5  --  16.0* 13.8* 13.4*  NEUTROABS 4.0  --  4.0  --   --   --   --   HGB 11.6*  < > 11.4*  < > 9.8* 9.2* 8.6*  HCT 36.2*  < > 37.4*  < > 32.6* 29.4* 28.7*  MCV 96.5  < > 95.2  --  95.9 97.4 97.3  PLT 196  < > 215  --  180 159 174  < > =  values in this interval not displayed. CBG:  Recent Labs  06/26/16 0956 06/29/16 0848 06/29/16 1334  GLUCAP 156* 128* 131*     ASSESSMENT/PLAN:  1. Unsteady gait - for home health PT and OT, for therapeutic strengthening exercises; fall precautions   2. Primary osteoarthritis of left knee - S/P left total knee arthroplasty, for home health PT and OT, for therapeutic strengthening exercises; continue Tylenol 325 mg take 2 tabs = 650 mg by mouth every 8 hours when necessary and Percocet 5-325 milligrams 1 tab by mouth every 8 hours when necessary for pain; follow-up with orthopedics and treatment of open wound on left knee with silver sulfADIAZINE (SILVADENE) 1 % cream; Apply 1 application topically daily. Apply to left knee wound  Dispense: 85 g; Refill: 0  3.  Other polyneuropath - stable, requested for Neurontin to be decreased to 200 mg Q HS - amitriptyline (ELAVIL) 50 MG tablet; Take 1 tablet (50 mg total) by mouth at bedtime.  Dispense: 30 tablet; Refill: 0 - gabapentin (NEURONTIN) 100 MG capsule; Take 2 capsules (200 mg total) by mouth at bedtime.   Dispense: 60 capsule; Refill: 0  4. Essential hypertension - well-controlled - amLODipine (NORVASC) 2.5 MG tablet; Take 1 tablet (2.5 mg total) by mouth daily.  Dispense: 30 tablet; Refill: 0  5. ESRD (end stage renal disease) on dialysis [facility]) - continue dialysis every MWF   6. Paroxysmal atrial fibrillation (HCC) - rate-controlled - warfarin (COUMADIN) 5 MG tablet; Take 1 tablet (5 mg total) by mouth daily.  Dispense: 30 tablet; Refill: 0 - carvedilol (COREG) 25 MG tablet; Take 1 tablet (25 mg total) by mouth See admin instructions. Give 1 tablet BID on M-W-Sat, give 1 tablet qd on Tue-Thu-Sun  Dispense: 36 tablet; Refill: 0  7. Anemia of chronic renal failure, unspecified stage - stable - ferric citrate (AURYXIA) 1 GM 210 MG(Fe) tablet; Take 1 tablet (210 mg total) by mouth 3 (three) times daily with meals.  Dispense: 90 tablet; Refill: 0 Lab Results  Component Value Date   HGB 8.6 (L) 06/29/2016    8. Hyperlipidemia, unspecified hyperlipidemia type - continue Zocor - simvastatin (ZOCOR) 20 MG tablet; Take 1 tablet (20 mg total) by mouth daily.  Dispense: 30 tablet; Refill: 0  9. Gout, unspecified cause, unspecified chronicity, unspecified site - stable - allopurinol (ZYLOPRIM) 100 MG tablet; Take 1 tablet (100 mg total) by mouth 2 (two) times daily.  Dispense: 60 tablet; Refill: 0     I have filled out patient's discharge paperwork and written prescriptions.  Patient will receive home health PT, OT and Nursing.  DME provided:  None  Total discharge time: Greater than 30 minutes Greater than 50% was spent in counseling and coordination of care.   Discharge time involved  coordination of the discharge process with social worker, nursing staff and therapy department. Medical justification for home health services verified.     Keyshawna Prouse C. Paraje - NP    [employer] [phone]

## 2016-08-01 DIAGNOSIS — N186 End stage renal disease: Secondary | ICD-10-CM | POA: Diagnosis not present

## 2016-08-01 DIAGNOSIS — D631 Anemia in chronic kidney disease: Secondary | ICD-10-CM | POA: Diagnosis not present

## 2016-08-01 DIAGNOSIS — N2581 Secondary hyperparathyroidism of renal origin: Secondary | ICD-10-CM | POA: Diagnosis not present

## 2016-08-01 DIAGNOSIS — E1129 Type 2 diabetes mellitus with other diabetic kidney complication: Secondary | ICD-10-CM | POA: Diagnosis not present

## 2016-08-01 DIAGNOSIS — Z992 Dependence on renal dialysis: Secondary | ICD-10-CM | POA: Diagnosis not present

## 2016-08-03 ENCOUNTER — Other Ambulatory Visit: Payer: Self-pay

## 2016-08-03 DIAGNOSIS — D631 Anemia in chronic kidney disease: Secondary | ICD-10-CM | POA: Diagnosis not present

## 2016-08-03 DIAGNOSIS — E1129 Type 2 diabetes mellitus with other diabetic kidney complication: Secondary | ICD-10-CM | POA: Diagnosis not present

## 2016-08-03 DIAGNOSIS — Z992 Dependence on renal dialysis: Secondary | ICD-10-CM | POA: Diagnosis not present

## 2016-08-03 DIAGNOSIS — N2581 Secondary hyperparathyroidism of renal origin: Secondary | ICD-10-CM | POA: Diagnosis not present

## 2016-08-03 DIAGNOSIS — N186 End stage renal disease: Secondary | ICD-10-CM | POA: Diagnosis not present

## 2016-08-03 NOTE — Patient Outreach (Signed)
Transition of care: New referral for transition of care from [place].  Placed call to patient. No answer. Left  A message requesting a call back. PLAN: will await a call back. If no response will attempt again on next business day.  Tomasa Rand, RN, BSN, CEN [facility] [employer] [phone]

## 2016-08-03 NOTE — Patient Outreach (Signed)
Transition of care: Patient returned call. States that he got home yesterday.  Reports the he can not talk and will call me back in 5 minutes.  Waited 30 minutes and no return call.   PLAN: will continue to outreach patient.  Tomasa Rand, RN, BSN, CEN [facility] [employer] [phone]

## 2016-08-06 ENCOUNTER — Other Ambulatory Visit: Payer: Self-pay

## 2016-08-06 DIAGNOSIS — M25562 Pain in left knee: Secondary | ICD-10-CM | POA: Diagnosis not present

## 2016-08-06 DIAGNOSIS — D631 Anemia in chronic kidney disease: Secondary | ICD-10-CM | POA: Diagnosis not present

## 2016-08-06 DIAGNOSIS — E1129 Type 2 diabetes mellitus with other diabetic kidney complication: Secondary | ICD-10-CM | POA: Diagnosis not present

## 2016-08-06 DIAGNOSIS — Z96652 Presence of left artificial knee joint: Secondary | ICD-10-CM | POA: Diagnosis not present

## 2016-08-06 DIAGNOSIS — N186 End stage renal disease: Secondary | ICD-10-CM | POA: Diagnosis not present

## 2016-08-06 DIAGNOSIS — Z992 Dependence on renal dialysis: Secondary | ICD-10-CM | POA: Diagnosis not present

## 2016-08-06 DIAGNOSIS — N2581 Secondary hyperparathyroidism of renal origin: Secondary | ICD-10-CM | POA: Diagnosis not present

## 2016-08-06 DIAGNOSIS — M25662 Stiffness of left knee, not elsewhere classified: Secondary | ICD-10-CM | POA: Diagnosis not present

## 2016-08-06 NOTE — Patient Outreach (Signed)
Transition of care: Placed call to patient who answered and identified himself. Patient reports that he is doing well. States that his wound is healing. Reports that he is going to outpatient rehab at [facility]. States that he is able to drive.  Patient with difficulty hearing on the phone.   PLAN: offered home visit for 08/09/2016 and patient has accepted. Confirmed address. Provided my contact information via phone. Will address care plan at home visit when patient can hear better.   Tomasa Rand, RN, BSN, CEN [facility] [employer] [phone]

## 2016-08-08 ENCOUNTER — Other Ambulatory Visit: Payer: Self-pay | Admitting: Pharmacist

## 2016-08-08 DIAGNOSIS — D631 Anemia in chronic kidney disease: Secondary | ICD-10-CM | POA: Diagnosis not present

## 2016-08-08 DIAGNOSIS — N186 End stage renal disease: Secondary | ICD-10-CM | POA: Diagnosis not present

## 2016-08-08 DIAGNOSIS — N2581 Secondary hyperparathyroidism of renal origin: Secondary | ICD-10-CM | POA: Diagnosis not present

## 2016-08-08 DIAGNOSIS — Z992 Dependence on renal dialysis: Secondary | ICD-10-CM | POA: Diagnosis not present

## 2016-08-08 DIAGNOSIS — E1129 Type 2 diabetes mellitus with other diabetic kidney complication: Secondary | ICD-10-CM | POA: Diagnosis not present

## 2016-08-08 DIAGNOSIS — I4891 Unspecified atrial fibrillation: Secondary | ICD-10-CM | POA: Diagnosis not present

## 2016-08-08 LAB — PROTIME-INR: INR: 3.5 — AB (ref 0.9–1.1)

## 2016-08-08 NOTE — Patient Outreach (Signed)
[facility]) Care Management  08/08/2016  Daniel Whitney 02/12/1956 [id number]  [facility] CM Pharmacist received referral for patient from [facility], [facility] RN, for medication assistance and management needs.    Patient was at [place] following total knee arthroplasty.  Successful phone outreach to patient, HIPAA details verified.  Patient reports that he was feeling tired due to having dialysis.    Explained purpose of call to review medications and his insurance options, and he consented to speak with pharmacist.  He reported to [place] not having prescription insurance.  Patient reported he picked up all of his discharge medications at [facility] and they were all affordable---he reports "something is paying for them."  Plan:   Patient prefers a call back later, due to having dialysis earlier.  Will make phone outreach attempt to patient next week, either Tuesday or Thursday, as he does not have dialysis those days, to better assess patient's pharmacy related needs.    Karrie Meres, PharmD, [place] [phone]

## 2016-08-09 ENCOUNTER — Other Ambulatory Visit: Payer: Self-pay

## 2016-08-09 DIAGNOSIS — M545 Low back pain: Secondary | ICD-10-CM | POA: Diagnosis not present

## 2016-08-09 NOTE — Patient Outreach (Signed)
[facility]) Care Management  08/09/2016  Daniel Whitney 11-Jun-1955 [id number]   Arrived for home visit.  Patient states that he has over booked himself. Reports that he has an MD appointment at 2 pm and needs to reschedule my visit.  Visit rescheduled for July 3rd at [place], RN, BSN, [facility] [employer] [phone]

## 2016-08-10 ENCOUNTER — Ambulatory Visit (INDEPENDENT_AMBULATORY_CARE_PROVIDER_SITE_OTHER): Payer: Medicare Other | Admitting: Pharmacist

## 2016-08-10 DIAGNOSIS — I480 Paroxysmal atrial fibrillation: Secondary | ICD-10-CM

## 2016-08-10 DIAGNOSIS — N2581 Secondary hyperparathyroidism of renal origin: Secondary | ICD-10-CM | POA: Diagnosis not present

## 2016-08-10 DIAGNOSIS — Z7901 Long term (current) use of anticoagulants: Secondary | ICD-10-CM

## 2016-08-10 DIAGNOSIS — D631 Anemia in chronic kidney disease: Secondary | ICD-10-CM | POA: Diagnosis not present

## 2016-08-10 DIAGNOSIS — Z992 Dependence on renal dialysis: Secondary | ICD-10-CM | POA: Diagnosis not present

## 2016-08-10 DIAGNOSIS — E1129 Type 2 diabetes mellitus with other diabetic kidney complication: Secondary | ICD-10-CM | POA: Diagnosis not present

## 2016-08-10 DIAGNOSIS — N186 End stage renal disease: Secondary | ICD-10-CM | POA: Diagnosis not present

## 2016-08-11 DIAGNOSIS — I120 Hypertensive chronic kidney disease with stage 5 chronic kidney disease or end stage renal disease: Secondary | ICD-10-CM | POA: Diagnosis not present

## 2016-08-11 DIAGNOSIS — N186 End stage renal disease: Secondary | ICD-10-CM | POA: Diagnosis not present

## 2016-08-11 DIAGNOSIS — Z992 Dependence on renal dialysis: Secondary | ICD-10-CM | POA: Diagnosis not present

## 2016-08-13 DIAGNOSIS — E1129 Type 2 diabetes mellitus with other diabetic kidney complication: Secondary | ICD-10-CM | POA: Diagnosis not present

## 2016-08-13 DIAGNOSIS — N2581 Secondary hyperparathyroidism of renal origin: Secondary | ICD-10-CM | POA: Diagnosis not present

## 2016-08-13 DIAGNOSIS — Z992 Dependence on renal dialysis: Secondary | ICD-10-CM | POA: Diagnosis not present

## 2016-08-13 DIAGNOSIS — N186 End stage renal disease: Secondary | ICD-10-CM | POA: Diagnosis not present

## 2016-08-13 DIAGNOSIS — D631 Anemia in chronic kidney disease: Secondary | ICD-10-CM | POA: Diagnosis not present

## 2016-08-14 ENCOUNTER — Other Ambulatory Visit: Payer: Self-pay | Admitting: Pharmacist

## 2016-08-14 ENCOUNTER — Other Ambulatory Visit: Payer: Self-pay

## 2016-08-14 NOTE — Patient Outreach (Signed)
[facility]) Care Management   08/14/2016  Daniel Whitney [birth year] [id number]  Daniel Whitney is an 61 y.o. male 2nd initial home visit Subjective: Post left knee replacement. Patient reports that he is getting better. Reports some difficulty with ambulation still. Uses a wheelchair now at home.  Prior to knee replacement was not using a wheelchair. Patient reports that his DM is controlled by diet. Reports his CHF is controlled by dialysis. Patient lives with his brother who assist as needed. Patient reports that he is able to drive himself to dialysis 3 times per week. States that he shares a car with his brother.  Objective:  Awake and alert.  Ambulates slowly with walker.  Review of Systems  Constitutional: Negative.   HENT: Negative.   Eyes:       Wears reading glasses  Respiratory: Negative.   Cardiovascular: Negative.   Gastrointestinal: Negative.   Genitourinary:       Goes to dialysis 3 times per week. MWF  Musculoskeletal: Positive for falls and joint pain.  Skin:       Reports dry skin  Neurological: Negative.   Endo/Heme/Allergies: Bruises/bleeds easily.  Psychiatric/Behavioral: Positive for memory loss. The patient has insomnia.        Reports restless sleeping    Physical Exam  Constitutional: He is oriented to person, place, and time. He appears well-developed and well-nourished.  Cardiovascular: Normal rate, regular rhythm, normal heart sounds and intact distal pulses.   Respiratory: Effort normal and breath sounds normal.  GI: Soft. Bowel sounds are normal.  Musculoskeletal: Normal range of motion. He exhibits edema.  Trace swelling to the right leg. 1 plus swelling to the left leg  Neurological: He is alert and oriented to person, place, and time.  2018, Tuesday, [place]  Skin: Skin is warm and dry.  Feet without sores or lesions. Left knee surgical site with one open area that is scabbed over. No drainage.   Psychiatric: He has a normal mood and  affect. His behavior is normal. Judgment and thought content normal.    Encounter Medications:   Outpatient Encounter Prescriptions as of 08/14/2016  Medication Sig  . acetaminophen (TYLENOL) 650 MG CR tablet Take 650 mg by mouth every 8 (eight) hours as needed for pain.  Marland Kitchen allopurinol (ZYLOPRIM) 100 MG tablet Take 1 tablet (100 mg total) by mouth 2 (two) times daily.  . Amino Acids-Protein Hydrolys (FEEDING SUPPLEMENT, PRO-STAT SUGAR FREE 64,) LIQD Take 30 mLs by mouth 2 (two) times daily.  Marland Kitchen amitriptyline (ELAVIL) 50 MG tablet Take 1 tablet (50 mg total) by mouth at bedtime.  Marland Kitchen amLODipine (NORVASC) 2.5 MG tablet Take 1 tablet (2.5 mg total) by mouth daily.  Marland Kitchen b complex-vitamin c-folic acid (NEPHRO-VITE) 0.8 MG TABS tablet Take 1 tablet by mouth daily.  . carvedilol (COREG) 25 MG tablet Take 1 tablet (25 mg total) by mouth See admin instructions. Give 1 tablet BID on M-W-Sat, give 1 tablet qd on Tue-Thu-Sun  . Dextromethorphan-Guaifenesin (MUCINEX DM) 30-600 MG TB12 Take 1 tablet by mouth 2 (two) times daily.  Marland Kitchen docusate sodium (COLACE) 100 MG capsule Take 100 mg by mouth 2 (two) times daily.  . ferric citrate (AURYXIA) 1 GM 210 MG(Fe) tablet Take 1 tablet (210 mg total) by mouth 3 (three) times daily with meals.  . gabapentin (NEURONTIN) 100 MG capsule Take 2 capsules (200 mg total) by mouth at bedtime. 200 mg qam and 300 mg upon return from dialysis on M-W-F for neuropathy  .  oxyCODONE-acetaminophen (PERCOCET/ROXICET) 5-325 MG tablet Take by mouth every 8 (eight) hours as needed for severe pain.  . Pramoxine-Menthol-Dimethicone (GOLD BOND MEDICATED ANTI ITCH EX) Apply to skin twice a day for itching.  . sennosides-docusate sodium (SENOKOT-S) 8.6-50 MG tablet Take 1 tablet by mouth daily.  . simvastatin (ZOCOR) 20 MG tablet Take 1 tablet (20 mg total) by mouth daily.  . sodium chloride (OCEAN) 0.65 % SOLN nasal spray Place 1 spray into both nostrils as needed for congestion.  Marland Kitchen warfarin  (COUMADIN) 5 MG tablet Take 1 tablet (5 mg total) by mouth daily.  . OXYGEN Inhale 2 L/min into the lungs as needed (To keep O2 sats above 90%).  . silver sulfADIAZINE (SILVADENE) 1 % cream Apply 1 application topically daily. Apply to left knee wound (Patient not taking: Reported on 08/14/2016)   No facility-administered encounter medications on file as of 08/14/2016.     Functional Status:   In your present state of health, do you have any difficulty performing the following activities: 08/14/2016 06/26/2016  Hearing? N -  Vision? Y -  Difficulty concentrating or making decisions? N -  Walking or climbing stairs? Y -  Dressing or bathing? N -  Doing errands, shopping? N Y  Conservation officer, nature and eating ? N -  Using the Toilet? N -  In the past six months, have you accidently leaked urine? N -  Do you have problems with loss of bowel control? N -  Managing your Medications? N -  Managing your Finances? N -  Housekeeping or managing your Housekeeping? N -  Some recent data might be hidden    Fall/Depression Screening:    Fall Risk  08/14/2016 03/23/2016 06/04/2013  Falls in the past year? Yes Yes Yes  Number falls in past yr: 1 1 2  or more  Injury with Fall? No No -  Risk Factor Category  - - (No Data)  Risk for fall due to : Impaired mobility - -  Follow up Falls prevention discussed - -   PHQ 2/9 Scores 08/14/2016 06/04/2013 12/30/2012  PHQ - 2 Score 1 1 0    Assessment:   (1) reviewed [facility] program. Reviewed consent form on file. Declines any changes. Provided [facility] calendar and my contact card.  Reviewed transition of care program. (2) post knee replacement with limited mobility.  Reports that he has not be exercising at home like he should.  Reports being more dependent on wheelchair that he likes to be.   (3) fell out of wheelchair yesterday by leaning forward too much per patients understanding. (4)dialysis MWF. Can drive himself but also has access to [facility] and is comfortable accessing this  benefit.   Plan:  (1) reviewed consent in chart. (2) reviewed need for motivation to get working on home exercises. Provided a listening ear, support and encouragement.  (3) reviewed importance of getting moving an limiting wheelchair use.  Reviewed fall precautions with throw rugs in hallway.  Patient voiced understanding. (4) encouraged patient to use his resources available at the kidney center.    Care planning and goal setting during home visit and primary goal is to get up and get moving better at home.   Next outreach planned for 1 week via phone. Encouraged patient to call sooner if needed. This note and barrier letter sent to MD.   [facility] CM Care Plan Problem One     Most Recent Value  Care Plan Problem One  Recent left knee replacement. now with  problems ambulating  Role Documenting the Problem One  Care Management [place] for Problem One  Active  [facility] Long Term Goal   Patient will report improved ambulation in the next 31 days.  [facility] Long Term Goal Start Date  08/14/16  Interventions for Problem One Long Term Goal  Reviewed importance of daily home exercises. Reviewed fall precautions.   [facility] CM Short Term Goal #1   Patient will report no readmission in the next 30 days  [facility] CM Short Term Goal #1 Start Date  08/14/16  Interventions for Short Term Goal #1  Reviewed importance of taking all medications as prescribed. Reviewed importance of follow up.  Reviewed 24 hour call a nurse line.   [facility] CM Short Term Goal #2   Patient will report doing home exercises twice a day for the next 30 days as recommended by physical therapist.   [facility] CM Short Term Goal #2 Start Date  08/14/16  Interventions for Short Term Goal #2  Reviewed importance of patient getting on a schedule to do his exercises. Reviewed beneifts of improved ambulation.     Tomasa Rand, RN, BSN, CEN [facility] [employer] [phone]

## 2016-08-14 NOTE — Patient Outreach (Signed)
[place] [facility]) Care Management  08/14/2016  Daniel Whitney 1955/04/05 [id number]  Follow-up call to patient, HIPAA details verified.  Patient reports he is able to afford his medications.  He reports he believes he has all of his medications but would like to try to get his medications for 90 day supplies at a time if his insurance allows.    He understands his prescriber will need to issue prescription for 90 days.   Offered home visit with patient to review medications following his recent SNF discharge---he accepted.    Plan:  Home visit scheduled with patient.   Karrie Meres, PharmD, [place] [phone]

## 2016-08-15 DIAGNOSIS — E1129 Type 2 diabetes mellitus with other diabetic kidney complication: Secondary | ICD-10-CM | POA: Diagnosis not present

## 2016-08-15 DIAGNOSIS — D631 Anemia in chronic kidney disease: Secondary | ICD-10-CM | POA: Diagnosis not present

## 2016-08-15 DIAGNOSIS — N186 End stage renal disease: Secondary | ICD-10-CM | POA: Diagnosis not present

## 2016-08-15 DIAGNOSIS — Z992 Dependence on renal dialysis: Secondary | ICD-10-CM | POA: Diagnosis not present

## 2016-08-15 DIAGNOSIS — N2581 Secondary hyperparathyroidism of renal origin: Secondary | ICD-10-CM | POA: Diagnosis not present

## 2016-08-16 ENCOUNTER — Other Ambulatory Visit: Payer: Self-pay | Admitting: Pharmacist

## 2016-08-16 NOTE — Patient Outreach (Signed)
[facility] [facility]) Care Management  [place]   08/16/2016  Rickardo Brinegar 25-Feb-1955 [id number]  Subjective:  Patient was initially referred to [place] Pharmacist by [facility] RN [name] for medication cost concerns and medication management.   Home visit completed with patient.  He reports his medication cost concerns have been resolved and he denies further medication affordability issues.    He had his medications present to review with [facility] Pharmacist.    His main concern is getting refills of some of his medications.    Patient has a past medical history significant for hypertension, atrial fibrillation, end stage renal disease on dialysis on Monday, Wednesday, Friday, and recently had a left total knee replacement.   He was discharged from [facility] 06/29/16 and discharged from [facility] 07/31/16.    Objective:   Encounter Medications: Outpatient Encounter Prescriptions as of 08/16/2016  Medication Sig  . acetaminophen (TYLENOL) 650 MG CR tablet Take 650 mg by mouth every 8 (eight) hours as needed for pain.  Marland Kitchen allopurinol (ZYLOPRIM) 100 MG tablet Take 1 tablet (100 mg total) by mouth 2 (two) times daily.  . Amino Acids-Protein Hydrolys (FEEDING SUPPLEMENT, PRO-STAT SUGAR FREE 64,) LIQD Take 30 mLs by mouth 2 (two) times daily.  Marland Kitchen amitriptyline (ELAVIL) 50 MG tablet Take 1 tablet (50 mg total) by mouth at bedtime.  Marland Kitchen amLODipine (NORVASC) 2.5 MG tablet Take 1 tablet (2.5 mg total) by mouth daily.  Marland Kitchen b complex-vitamin c-folic acid (NEPHRO-VITE) 0.8 MG TABS tablet Take 1 tablet by mouth daily.  . carvedilol (COREG) 25 MG tablet Take 1 tablet (25 mg total) by mouth See admin instructions. Give 1 tablet BID on M-W-Sat, give 1 tablet qd on Tue-Thu-Sun  . Dextromethorphan-Guaifenesin (MUCINEX DM) 30-600 MG TB12 Take 1 tablet by mouth 2 (two) times daily.  Marland Kitchen docusate sodium (COLACE) 100 MG capsule Take 100 mg by mouth 2 (two) times daily.  . ferric citrate (AURYXIA) 1 GM 210 MG(Fe)  tablet Take 1 tablet (210 mg total) by mouth 3 (three) times daily with meals.  . gabapentin (NEURONTIN) 100 MG capsule Take 2 capsules (200 mg total) by mouth at bedtime. 200 mg qam and 300 mg upon return from dialysis on M-W-F for neuropathy  . oxyCODONE-acetaminophen (PERCOCET/ROXICET) 5-325 MG tablet Take by mouth every 8 (eight) hours as needed for severe pain.  . OXYGEN Inhale 2 L/min into the lungs as needed (To keep O2 sats above 90%).  . Pramoxine-Menthol-Dimethicone (GOLD BOND MEDICATED ANTI ITCH EX) Apply to skin twice a day for itching.  . sennosides-docusate sodium (SENOKOT-S) 8.6-50 MG tablet Take 1 tablet by mouth daily.  . silver sulfADIAZINE (SILVADENE) 1 % cream Apply 1 application topically daily. Apply to left knee wound (Patient not taking: Reported on 08/14/2016)  . simvastatin (ZOCOR) 20 MG tablet Take 1 tablet (20 mg total) by mouth daily.  . sodium chloride (OCEAN) 0.65 % SOLN nasal spray Place 1 spray into both nostrils as needed for congestion.  Marland Kitchen warfarin (COUMADIN) 5 MG tablet Take 1 tablet (5 mg total) by mouth daily.   No facility-administered encounter medications on file as of 08/16/2016.     Functional Status: In your present state of health, do you have any difficulty performing the following activities: 08/14/2016 06/26/2016  Hearing? N -  Vision? Y -  Difficulty concentrating or making decisions? N -  Walking or climbing stairs? Y -  Dressing or bathing? N -  Doing errands, shopping? Aggie Moats  Preparing  Food and eating ? N -  Using the Toilet? N -  In the past six months, have you accidently leaked urine? N -  Do you have problems with loss of bowel control? N -  Managing your Medications? N -  Managing your Finances? N -  Housekeeping or managing your Housekeeping? N -  Some recent data might be hidden    Fall/Depression Screening: Fall Risk  08/14/2016 03/23/2016 06/04/2013  Falls in the past year? Yes Yes Yes  Number falls in past yr: 1 1 2  or more  Injury  with Fall? No No -  Risk Factor Category  - - (No Data)  Risk for fall due to : Impaired mobility - -  Follow up Falls prevention discussed - -   PHQ 2/9 Scores 08/14/2016 06/04/2013 12/30/2012  PHQ - 2 Score 1 1 0      Assessment:  Medication review per patient report and medication bottles in his possession.  Patient was recently discharged from hospital and all medications have been reviewed.  Drugs sorted by system:  Neurologic/Psychologic: -amitriptyline  -gabapentin   Cardiovascular: -amlodipine  -carvedilol -simvastatin  -warfarin 5 mg tabs in his possession  Gastrointestinal: -docusate  -senna   Endocrine: -allopurinol  Renal: -renal vitamin  -ferric citrate Lorin Picket)   Pain: -acetaminophen as needed -oxycodone/acetaminophen as needed   Miscellaneous: -dextromethorphan/guaifenesin as needed   Other issues noted:   1) Warfarin---patient thought he was taking 3 mg tabs---he had 5 mg tabs in his possession and denied using any other warfarin tablets.  He didn't realize dose had changed.  Last warfarin encounter in chart from Raquel, PharmD with anti-coagulation clinic on 08/10/16 indicates weekly dose using 3 mg tabs.    Phone call to Raquel, whom patient reports is managing his warfarin.  Advised Raquel patient has 5 mg warfarin tablets and reports taking 1 tab daily which is a dose discrepancy---she will monitor patient's INR and plans to have INR drawn 08/17/16.    2) Patient will need refills of the following medications---written by SNF provider with 0 refills: -amlodipine -gabapentin  3) Patient reports he is going to ask dialysis center about his Auryxia dosing as he doesn't feel he eats three meals a day.    Plan:  1) Will route note and barriers letter to PCP  2) Will place follow-up call to patient next week on a non-dialysis day, Tuesday or Thursday, per his request.   Karrie Meres, PharmD, [place] [phone]

## 2016-08-17 ENCOUNTER — Encounter: Payer: Self-pay | Admitting: Pharmacist

## 2016-08-17 DIAGNOSIS — Z992 Dependence on renal dialysis: Secondary | ICD-10-CM | POA: Diagnosis not present

## 2016-08-17 DIAGNOSIS — M25662 Stiffness of left knee, not elsewhere classified: Secondary | ICD-10-CM | POA: Diagnosis not present

## 2016-08-17 DIAGNOSIS — M25562 Pain in left knee: Secondary | ICD-10-CM | POA: Diagnosis not present

## 2016-08-17 DIAGNOSIS — E1129 Type 2 diabetes mellitus with other diabetic kidney complication: Secondary | ICD-10-CM | POA: Diagnosis not present

## 2016-08-17 DIAGNOSIS — N186 End stage renal disease: Secondary | ICD-10-CM | POA: Diagnosis not present

## 2016-08-17 DIAGNOSIS — D631 Anemia in chronic kidney disease: Secondary | ICD-10-CM | POA: Diagnosis not present

## 2016-08-17 DIAGNOSIS — Z96652 Presence of left artificial knee joint: Secondary | ICD-10-CM | POA: Diagnosis not present

## 2016-08-17 DIAGNOSIS — N2581 Secondary hyperparathyroidism of renal origin: Secondary | ICD-10-CM | POA: Diagnosis not present

## 2016-08-20 DIAGNOSIS — N2581 Secondary hyperparathyroidism of renal origin: Secondary | ICD-10-CM | POA: Diagnosis not present

## 2016-08-20 DIAGNOSIS — E1129 Type 2 diabetes mellitus with other diabetic kidney complication: Secondary | ICD-10-CM | POA: Diagnosis not present

## 2016-08-20 DIAGNOSIS — D631 Anemia in chronic kidney disease: Secondary | ICD-10-CM | POA: Diagnosis not present

## 2016-08-20 DIAGNOSIS — N186 End stage renal disease: Secondary | ICD-10-CM | POA: Diagnosis not present

## 2016-08-20 DIAGNOSIS — Z992 Dependence on renal dialysis: Secondary | ICD-10-CM | POA: Diagnosis not present

## 2016-08-21 ENCOUNTER — Other Ambulatory Visit: Payer: Medicare Other

## 2016-08-21 ENCOUNTER — Other Ambulatory Visit: Payer: Self-pay | Admitting: Pharmacist

## 2016-08-21 ENCOUNTER — Other Ambulatory Visit: Payer: Self-pay

## 2016-08-21 DIAGNOSIS — M5441 Lumbago with sciatica, right side: Secondary | ICD-10-CM | POA: Diagnosis not present

## 2016-08-21 NOTE — Patient Outreach (Signed)
[place] [facility]) Care Management  08/21/2016  Daniel Whitney [birth year] [id number]  Unsuccessful phone follow-up to patient.  No answer, HIPAA compliant message left requesting return call.   Plan:  Will make another outreach attempt next week if no return call.  Karrie Meres, PharmD, [place] [phone]

## 2016-08-21 NOTE — Patient Outreach (Signed)
Transition of care: Placed call to patient for transition of care. No answer. Left a message requesting a call back.  PLAN: Will await a call back. Will continue to engage patient.  Tomasa Rand, RN, BSN, CEN [facility] [employer] [phone]

## 2016-08-22 DIAGNOSIS — D631 Anemia in chronic kidney disease: Secondary | ICD-10-CM | POA: Diagnosis not present

## 2016-08-22 DIAGNOSIS — E1129 Type 2 diabetes mellitus with other diabetic kidney complication: Secondary | ICD-10-CM | POA: Diagnosis not present

## 2016-08-22 DIAGNOSIS — N2581 Secondary hyperparathyroidism of renal origin: Secondary | ICD-10-CM | POA: Diagnosis not present

## 2016-08-22 DIAGNOSIS — Z992 Dependence on renal dialysis: Secondary | ICD-10-CM | POA: Diagnosis not present

## 2016-08-22 DIAGNOSIS — N186 End stage renal disease: Secondary | ICD-10-CM | POA: Diagnosis not present

## 2016-08-23 DIAGNOSIS — M25562 Pain in left knee: Secondary | ICD-10-CM | POA: Diagnosis not present

## 2016-08-23 DIAGNOSIS — Z96652 Presence of left artificial knee joint: Secondary | ICD-10-CM | POA: Diagnosis not present

## 2016-08-23 DIAGNOSIS — M25662 Stiffness of left knee, not elsewhere classified: Secondary | ICD-10-CM | POA: Diagnosis not present

## 2016-08-24 DIAGNOSIS — I4891 Unspecified atrial fibrillation: Secondary | ICD-10-CM | POA: Diagnosis not present

## 2016-08-24 DIAGNOSIS — Z992 Dependence on renal dialysis: Secondary | ICD-10-CM | POA: Diagnosis not present

## 2016-08-24 DIAGNOSIS — D631 Anemia in chronic kidney disease: Secondary | ICD-10-CM | POA: Diagnosis not present

## 2016-08-24 DIAGNOSIS — N186 End stage renal disease: Secondary | ICD-10-CM | POA: Diagnosis not present

## 2016-08-24 DIAGNOSIS — E1129 Type 2 diabetes mellitus with other diabetic kidney complication: Secondary | ICD-10-CM | POA: Diagnosis not present

## 2016-08-24 DIAGNOSIS — N2581 Secondary hyperparathyroidism of renal origin: Secondary | ICD-10-CM | POA: Diagnosis not present

## 2016-08-24 LAB — PROTIME-INR: INR: 5.3 — AB (ref <=1.1)

## 2016-08-25 LAB — PROTIME-INR

## 2016-08-27 ENCOUNTER — Ambulatory Visit (INDEPENDENT_AMBULATORY_CARE_PROVIDER_SITE_OTHER): Payer: Medicare Other | Admitting: Pharmacist Clinician (PhC)/ Clinical Pharmacy Specialist

## 2016-08-27 DIAGNOSIS — D689 Coagulation defect, unspecified: Secondary | ICD-10-CM | POA: Diagnosis not present

## 2016-08-27 DIAGNOSIS — Z96652 Presence of left artificial knee joint: Secondary | ICD-10-CM | POA: Diagnosis not present

## 2016-08-27 DIAGNOSIS — N2581 Secondary hyperparathyroidism of renal origin: Secondary | ICD-10-CM | POA: Diagnosis not present

## 2016-08-27 DIAGNOSIS — Z7901 Long term (current) use of anticoagulants: Secondary | ICD-10-CM

## 2016-08-27 DIAGNOSIS — M25662 Stiffness of left knee, not elsewhere classified: Secondary | ICD-10-CM | POA: Diagnosis not present

## 2016-08-27 DIAGNOSIS — I480 Paroxysmal atrial fibrillation: Secondary | ICD-10-CM

## 2016-08-27 DIAGNOSIS — D631 Anemia in chronic kidney disease: Secondary | ICD-10-CM | POA: Diagnosis not present

## 2016-08-27 DIAGNOSIS — Z992 Dependence on renal dialysis: Secondary | ICD-10-CM | POA: Diagnosis not present

## 2016-08-27 DIAGNOSIS — N186 End stage renal disease: Secondary | ICD-10-CM | POA: Diagnosis not present

## 2016-08-27 DIAGNOSIS — M25562 Pain in left knee: Secondary | ICD-10-CM | POA: Diagnosis not present

## 2016-08-27 DIAGNOSIS — E1129 Type 2 diabetes mellitus with other diabetic kidney complication: Secondary | ICD-10-CM | POA: Diagnosis not present

## 2016-08-28 ENCOUNTER — Ambulatory Visit (INDEPENDENT_AMBULATORY_CARE_PROVIDER_SITE_OTHER): Payer: Medicare Other | Admitting: Pharmacist

## 2016-08-28 ENCOUNTER — Other Ambulatory Visit: Payer: Self-pay

## 2016-08-28 ENCOUNTER — Other Ambulatory Visit: Payer: Self-pay | Admitting: Pharmacist

## 2016-08-28 DIAGNOSIS — Z7901 Long term (current) use of anticoagulants: Secondary | ICD-10-CM

## 2016-08-28 DIAGNOSIS — I480 Paroxysmal atrial fibrillation: Secondary | ICD-10-CM

## 2016-08-28 LAB — PROTIME-INR: INR: 6 — AB (ref <=1.1)

## 2016-08-28 NOTE — Patient Outreach (Signed)
[place] [facility]) Care Management  08/28/2016  Daniel Whitney [birth year] [id number]  Second phone outreach attempt to patient.    Patient states he has been given new direction for his warfarin from anti-coagulation clinic after his INR was supra-therapeutic.  He reports he is following with anti-coagulation clinic regarding his INR.   Patient reports he believes he has his refills.  He reports dialysis center is presently having him take Auryxia (ferric citrate) 2 tabs three times daily with meals.    He denies other pharmacy related questions/concerns at this time.   Plan:  Will place a follow-up call to patient in about 2 weeks---if no further pharmacy needs at that time, will close patient's case.     [facility] CM Care Plan Problem One     Most Recent Value  Care Plan Problem One  Medication management  Role Documenting the Problem One  Clinical Pharmacist  Care Plan for Problem One  Active  [facility] CM Short Term Goal #1   Patient will contact his PCP for follow-up within the next 14-30 days  [facility] CM Short Term Goal #1 Start Date  08/16/16  Interventions for Short Term Goal #1  patient reports not contacing PCP yet---was reminded of importance of doing this    Karrie Meres, PharmD, [place] [phone]

## 2016-08-28 NOTE — Patient Outreach (Signed)
Transition of care:  Placed call to patient for transition of care follow up.  Patient reports that he is improving slowly. States that he is now using crutches instead of his wheelchair to be more active. Declines any recent falls.  States that dialysis continues to go well with out any problems.  Reports he went to the coumadin clinic and medications were adjusted.   PLAN: Will continue weekly transition of care calls. Encouraged patient to call sooner if needed.  Tomasa Rand, RN, BSN, CEN [facility] [employer] [phone]

## 2016-08-29 DIAGNOSIS — Z96652 Presence of left artificial knee joint: Secondary | ICD-10-CM | POA: Diagnosis not present

## 2016-08-29 DIAGNOSIS — D631 Anemia in chronic kidney disease: Secondary | ICD-10-CM | POA: Diagnosis not present

## 2016-08-29 DIAGNOSIS — N2581 Secondary hyperparathyroidism of renal origin: Secondary | ICD-10-CM | POA: Diagnosis not present

## 2016-08-29 DIAGNOSIS — M25662 Stiffness of left knee, not elsewhere classified: Secondary | ICD-10-CM | POA: Diagnosis not present

## 2016-08-29 DIAGNOSIS — M25562 Pain in left knee: Secondary | ICD-10-CM | POA: Diagnosis not present

## 2016-08-29 DIAGNOSIS — E1129 Type 2 diabetes mellitus with other diabetic kidney complication: Secondary | ICD-10-CM | POA: Diagnosis not present

## 2016-08-29 DIAGNOSIS — N186 End stage renal disease: Secondary | ICD-10-CM | POA: Diagnosis not present

## 2016-08-29 DIAGNOSIS — Z992 Dependence on renal dialysis: Secondary | ICD-10-CM | POA: Diagnosis not present

## 2016-08-31 DIAGNOSIS — E1129 Type 2 diabetes mellitus with other diabetic kidney complication: Secondary | ICD-10-CM | POA: Diagnosis not present

## 2016-08-31 DIAGNOSIS — N2581 Secondary hyperparathyroidism of renal origin: Secondary | ICD-10-CM | POA: Diagnosis not present

## 2016-08-31 DIAGNOSIS — Z992 Dependence on renal dialysis: Secondary | ICD-10-CM | POA: Diagnosis not present

## 2016-08-31 DIAGNOSIS — D631 Anemia in chronic kidney disease: Secondary | ICD-10-CM | POA: Diagnosis not present

## 2016-08-31 DIAGNOSIS — N186 End stage renal disease: Secondary | ICD-10-CM | POA: Diagnosis not present

## 2016-09-03 DIAGNOSIS — N2581 Secondary hyperparathyroidism of renal origin: Secondary | ICD-10-CM | POA: Diagnosis not present

## 2016-09-03 DIAGNOSIS — E1129 Type 2 diabetes mellitus with other diabetic kidney complication: Secondary | ICD-10-CM | POA: Diagnosis not present

## 2016-09-03 DIAGNOSIS — D631 Anemia in chronic kidney disease: Secondary | ICD-10-CM | POA: Diagnosis not present

## 2016-09-03 DIAGNOSIS — Z992 Dependence on renal dialysis: Secondary | ICD-10-CM | POA: Diagnosis not present

## 2016-09-03 DIAGNOSIS — I4891 Unspecified atrial fibrillation: Secondary | ICD-10-CM | POA: Diagnosis not present

## 2016-09-03 DIAGNOSIS — N186 End stage renal disease: Secondary | ICD-10-CM | POA: Diagnosis not present

## 2016-09-03 LAB — PROTIME-INR: INR: 1.7 — AB (ref 0.9–1.1)

## 2016-09-04 ENCOUNTER — Other Ambulatory Visit: Payer: Self-pay

## 2016-09-04 NOTE — Patient Outreach (Signed)
Transition of care:  Placed 2nd call to patient and patient answered and reports that he is doing well. Reports he is no longer using a wheelchair and is only using a walker and crutches.    Reports no recent falls.   PLAN: reviewed 30 day goals are met and reviewed I would contact patient in 1 month for follow up. Encouraged patient to call Me sooner if needed. He agreed.   Tomasa Rand, RN, BSN, CEN [facility] [employer] [phone]

## 2016-09-05 DIAGNOSIS — Z992 Dependence on renal dialysis: Secondary | ICD-10-CM | POA: Diagnosis not present

## 2016-09-05 DIAGNOSIS — D631 Anemia in chronic kidney disease: Secondary | ICD-10-CM | POA: Diagnosis not present

## 2016-09-05 DIAGNOSIS — I4891 Unspecified atrial fibrillation: Secondary | ICD-10-CM | POA: Diagnosis not present

## 2016-09-05 DIAGNOSIS — E1129 Type 2 diabetes mellitus with other diabetic kidney complication: Secondary | ICD-10-CM | POA: Diagnosis not present

## 2016-09-05 DIAGNOSIS — N186 End stage renal disease: Secondary | ICD-10-CM | POA: Diagnosis not present

## 2016-09-05 DIAGNOSIS — N2581 Secondary hyperparathyroidism of renal origin: Secondary | ICD-10-CM | POA: Diagnosis not present

## 2016-09-06 ENCOUNTER — Ambulatory Visit (INDEPENDENT_AMBULATORY_CARE_PROVIDER_SITE_OTHER): Payer: Medicare Other | Admitting: Pharmacist

## 2016-09-06 DIAGNOSIS — I480 Paroxysmal atrial fibrillation: Secondary | ICD-10-CM

## 2016-09-06 DIAGNOSIS — Z7901 Long term (current) use of anticoagulants: Secondary | ICD-10-CM

## 2016-09-07 DIAGNOSIS — Z992 Dependence on renal dialysis: Secondary | ICD-10-CM | POA: Diagnosis not present

## 2016-09-07 DIAGNOSIS — D631 Anemia in chronic kidney disease: Secondary | ICD-10-CM | POA: Diagnosis not present

## 2016-09-07 DIAGNOSIS — N2581 Secondary hyperparathyroidism of renal origin: Secondary | ICD-10-CM | POA: Diagnosis not present

## 2016-09-07 DIAGNOSIS — N186 End stage renal disease: Secondary | ICD-10-CM | POA: Diagnosis not present

## 2016-09-07 DIAGNOSIS — E1129 Type 2 diabetes mellitus with other diabetic kidney complication: Secondary | ICD-10-CM | POA: Diagnosis not present

## 2016-09-10 DIAGNOSIS — D631 Anemia in chronic kidney disease: Secondary | ICD-10-CM | POA: Diagnosis not present

## 2016-09-10 DIAGNOSIS — M25562 Pain in left knee: Secondary | ICD-10-CM | POA: Diagnosis not present

## 2016-09-10 DIAGNOSIS — M545 Low back pain: Secondary | ICD-10-CM | POA: Diagnosis not present

## 2016-09-10 DIAGNOSIS — Z96652 Presence of left artificial knee joint: Secondary | ICD-10-CM | POA: Diagnosis not present

## 2016-09-10 DIAGNOSIS — M25662 Stiffness of left knee, not elsewhere classified: Secondary | ICD-10-CM | POA: Diagnosis not present

## 2016-09-10 DIAGNOSIS — N186 End stage renal disease: Secondary | ICD-10-CM | POA: Diagnosis not present

## 2016-09-10 DIAGNOSIS — Z992 Dependence on renal dialysis: Secondary | ICD-10-CM | POA: Diagnosis not present

## 2016-09-10 DIAGNOSIS — N2581 Secondary hyperparathyroidism of renal origin: Secondary | ICD-10-CM | POA: Diagnosis not present

## 2016-09-10 DIAGNOSIS — E1129 Type 2 diabetes mellitus with other diabetic kidney complication: Secondary | ICD-10-CM | POA: Diagnosis not present

## 2016-09-11 ENCOUNTER — Ambulatory Visit: Payer: Medicare Other | Admitting: Pharmacist

## 2016-09-11 ENCOUNTER — Other Ambulatory Visit: Payer: Self-pay | Admitting: Pharmacist

## 2016-09-11 DIAGNOSIS — N186 End stage renal disease: Secondary | ICD-10-CM | POA: Diagnosis not present

## 2016-09-11 DIAGNOSIS — I120 Hypertensive chronic kidney disease with stage 5 chronic kidney disease or end stage renal disease: Secondary | ICD-10-CM | POA: Diagnosis not present

## 2016-09-11 DIAGNOSIS — Z992 Dependence on renal dialysis: Secondary | ICD-10-CM | POA: Diagnosis not present

## 2016-09-11 NOTE — Patient Outreach (Signed)
[place] [facility]) Care Management  09/11/2016  Daniel Whitney [birth year] [id number]  Unsuccessful phone follow-up with patient.  HIPAA compliant message left requesting return call.   Plan:  If no return call, will make second phone outreach attempt within the next week.   Karrie Meres, PharmD, [place] [phone]

## 2016-09-12 DIAGNOSIS — M25662 Stiffness of left knee, not elsewhere classified: Secondary | ICD-10-CM | POA: Diagnosis not present

## 2016-09-12 DIAGNOSIS — I482 Chronic atrial fibrillation: Secondary | ICD-10-CM | POA: Diagnosis not present

## 2016-09-12 DIAGNOSIS — E1143 Type 2 diabetes mellitus with diabetic autonomic (poly)neuropathy: Secondary | ICD-10-CM | POA: Diagnosis not present

## 2016-09-12 DIAGNOSIS — M545 Low back pain: Secondary | ICD-10-CM | POA: Diagnosis not present

## 2016-09-12 DIAGNOSIS — E1129 Type 2 diabetes mellitus with other diabetic kidney complication: Secondary | ICD-10-CM | POA: Diagnosis not present

## 2016-09-12 DIAGNOSIS — I10 Essential (primary) hypertension: Secondary | ICD-10-CM | POA: Diagnosis not present

## 2016-09-12 DIAGNOSIS — N2581 Secondary hyperparathyroidism of renal origin: Secondary | ICD-10-CM | POA: Diagnosis not present

## 2016-09-12 DIAGNOSIS — Z992 Dependence on renal dialysis: Secondary | ICD-10-CM | POA: Diagnosis not present

## 2016-09-12 DIAGNOSIS — Z96652 Presence of left artificial knee joint: Secondary | ICD-10-CM | POA: Diagnosis not present

## 2016-09-12 DIAGNOSIS — F5221 Male erectile disorder: Secondary | ICD-10-CM | POA: Diagnosis not present

## 2016-09-12 DIAGNOSIS — M25562 Pain in left knee: Secondary | ICD-10-CM | POA: Diagnosis not present

## 2016-09-12 DIAGNOSIS — Z1211 Encounter for screening for malignant neoplasm of colon: Secondary | ICD-10-CM | POA: Diagnosis not present

## 2016-09-12 DIAGNOSIS — R21 Rash and other nonspecific skin eruption: Secondary | ICD-10-CM | POA: Diagnosis not present

## 2016-09-12 DIAGNOSIS — I4891 Unspecified atrial fibrillation: Secondary | ICD-10-CM | POA: Diagnosis not present

## 2016-09-12 DIAGNOSIS — N186 End stage renal disease: Secondary | ICD-10-CM | POA: Diagnosis not present

## 2016-09-12 LAB — PROTIME-INR: INR: 1.4 — AB (ref <=1.1)

## 2016-09-13 ENCOUNTER — Other Ambulatory Visit: Payer: Self-pay | Admitting: Pharmacist

## 2016-09-13 NOTE — Patient Outreach (Signed)
[place] [facility]) Care Management  09/13/2016  Nabeel Gladson 06/07/1955 [id number]  Successful phone outreach to patient, HIPAA details verified.   Patient reports he has his medications and is taking medications as prescribed.  Counseled patient to continue taking medications as prescribed.    He reports he saw his PCP this week and he got his refills.  He reports his medications are affordable at this time.    Patient denies pharmacy related questions/concerns at this time.     Plan:  Will close pharmacy episode as patient denies further pharmacy needs.   Will update [facility] RN Estill Bamberg of pharmacy case closure.   Patient aware he can contact [facility] Pharmacist if new pharmacy needs arise.   Karrie Meres, PharmD, [place] [phone]

## 2016-09-14 ENCOUNTER — Ambulatory Visit (INDEPENDENT_AMBULATORY_CARE_PROVIDER_SITE_OTHER): Payer: Self-pay | Admitting: Pharmacist Clinician (PhC)/ Clinical Pharmacy Specialist

## 2016-09-14 DIAGNOSIS — Z992 Dependence on renal dialysis: Secondary | ICD-10-CM | POA: Diagnosis not present

## 2016-09-14 DIAGNOSIS — N186 End stage renal disease: Secondary | ICD-10-CM | POA: Diagnosis not present

## 2016-09-14 DIAGNOSIS — N2581 Secondary hyperparathyroidism of renal origin: Secondary | ICD-10-CM | POA: Diagnosis not present

## 2016-09-14 DIAGNOSIS — Z7901 Long term (current) use of anticoagulants: Secondary | ICD-10-CM

## 2016-09-14 DIAGNOSIS — I480 Paroxysmal atrial fibrillation: Secondary | ICD-10-CM

## 2016-09-14 DIAGNOSIS — E1129 Type 2 diabetes mellitus with other diabetic kidney complication: Secondary | ICD-10-CM | POA: Diagnosis not present

## 2016-09-15 ENCOUNTER — Other Ambulatory Visit: Payer: Self-pay | Admitting: Adult Health

## 2016-09-15 DIAGNOSIS — I480 Paroxysmal atrial fibrillation: Secondary | ICD-10-CM

## 2016-09-17 DIAGNOSIS — Z992 Dependence on renal dialysis: Secondary | ICD-10-CM | POA: Diagnosis not present

## 2016-09-17 DIAGNOSIS — N2581 Secondary hyperparathyroidism of renal origin: Secondary | ICD-10-CM | POA: Diagnosis not present

## 2016-09-17 DIAGNOSIS — E1129 Type 2 diabetes mellitus with other diabetic kidney complication: Secondary | ICD-10-CM | POA: Diagnosis not present

## 2016-09-17 DIAGNOSIS — N186 End stage renal disease: Secondary | ICD-10-CM | POA: Diagnosis not present

## 2016-09-18 ENCOUNTER — Telehealth: Payer: Self-pay | Admitting: Internal Medicine

## 2016-09-18 DIAGNOSIS — I480 Paroxysmal atrial fibrillation: Secondary | ICD-10-CM

## 2016-09-18 DIAGNOSIS — M25511 Pain in right shoulder: Secondary | ICD-10-CM | POA: Diagnosis not present

## 2016-09-18 DIAGNOSIS — M19011 Primary osteoarthritis, right shoulder: Secondary | ICD-10-CM | POA: Diagnosis not present

## 2016-09-18 DIAGNOSIS — M25562 Pain in left knee: Secondary | ICD-10-CM | POA: Diagnosis not present

## 2016-09-18 MED ORDER — WARFARIN SODIUM 5 MG PO TABS: ORAL_TABLET | ORAL | 0 refills | Status: DC

## 2016-09-18 NOTE — Telephone Encounter (Signed)
rx sent to patient preferred pharmacy

## 2016-09-18 NOTE — Telephone Encounter (Signed)
Pt  Calling requesting a refill of Warfarin 5 mg tablet, sent to [facility] on [address] Please address. Thanks

## 2016-09-19 DIAGNOSIS — N186 End stage renal disease: Secondary | ICD-10-CM | POA: Diagnosis not present

## 2016-09-19 DIAGNOSIS — N2581 Secondary hyperparathyroidism of renal origin: Secondary | ICD-10-CM | POA: Diagnosis not present

## 2016-09-19 DIAGNOSIS — Z992 Dependence on renal dialysis: Secondary | ICD-10-CM | POA: Diagnosis not present

## 2016-09-19 DIAGNOSIS — E1129 Type 2 diabetes mellitus with other diabetic kidney complication: Secondary | ICD-10-CM | POA: Diagnosis not present

## 2016-09-21 DIAGNOSIS — E1129 Type 2 diabetes mellitus with other diabetic kidney complication: Secondary | ICD-10-CM | POA: Diagnosis not present

## 2016-09-21 DIAGNOSIS — N2581 Secondary hyperparathyroidism of renal origin: Secondary | ICD-10-CM | POA: Diagnosis not present

## 2016-09-21 DIAGNOSIS — Z992 Dependence on renal dialysis: Secondary | ICD-10-CM | POA: Diagnosis not present

## 2016-09-21 DIAGNOSIS — N186 End stage renal disease: Secondary | ICD-10-CM | POA: Diagnosis not present

## 2016-09-24 DIAGNOSIS — E1129 Type 2 diabetes mellitus with other diabetic kidney complication: Secondary | ICD-10-CM | POA: Diagnosis not present

## 2016-09-24 DIAGNOSIS — Z992 Dependence on renal dialysis: Secondary | ICD-10-CM | POA: Diagnosis not present

## 2016-09-24 DIAGNOSIS — N2581 Secondary hyperparathyroidism of renal origin: Secondary | ICD-10-CM | POA: Diagnosis not present

## 2016-09-24 DIAGNOSIS — N186 End stage renal disease: Secondary | ICD-10-CM | POA: Diagnosis not present

## 2016-09-26 DIAGNOSIS — Z992 Dependence on renal dialysis: Secondary | ICD-10-CM | POA: Diagnosis not present

## 2016-09-26 DIAGNOSIS — N186 End stage renal disease: Secondary | ICD-10-CM | POA: Diagnosis not present

## 2016-09-26 DIAGNOSIS — N2581 Secondary hyperparathyroidism of renal origin: Secondary | ICD-10-CM | POA: Diagnosis not present

## 2016-09-26 DIAGNOSIS — E1129 Type 2 diabetes mellitus with other diabetic kidney complication: Secondary | ICD-10-CM | POA: Diagnosis not present

## 2016-09-28 DIAGNOSIS — N2581 Secondary hyperparathyroidism of renal origin: Secondary | ICD-10-CM | POA: Diagnosis not present

## 2016-09-28 DIAGNOSIS — E1129 Type 2 diabetes mellitus with other diabetic kidney complication: Secondary | ICD-10-CM | POA: Diagnosis not present

## 2016-09-28 DIAGNOSIS — N186 End stage renal disease: Secondary | ICD-10-CM | POA: Diagnosis not present

## 2016-09-28 DIAGNOSIS — Z992 Dependence on renal dialysis: Secondary | ICD-10-CM | POA: Diagnosis not present

## 2016-10-01 DIAGNOSIS — N186 End stage renal disease: Secondary | ICD-10-CM | POA: Diagnosis not present

## 2016-10-01 DIAGNOSIS — N2581 Secondary hyperparathyroidism of renal origin: Secondary | ICD-10-CM | POA: Diagnosis not present

## 2016-10-01 DIAGNOSIS — Z992 Dependence on renal dialysis: Secondary | ICD-10-CM | POA: Diagnosis not present

## 2016-10-01 DIAGNOSIS — E1129 Type 2 diabetes mellitus with other diabetic kidney complication: Secondary | ICD-10-CM | POA: Diagnosis not present

## 2016-10-03 DIAGNOSIS — I4891 Unspecified atrial fibrillation: Secondary | ICD-10-CM | POA: Diagnosis not present

## 2016-10-03 DIAGNOSIS — N2581 Secondary hyperparathyroidism of renal origin: Secondary | ICD-10-CM | POA: Diagnosis not present

## 2016-10-03 DIAGNOSIS — Z992 Dependence on renal dialysis: Secondary | ICD-10-CM | POA: Diagnosis not present

## 2016-10-03 DIAGNOSIS — N186 End stage renal disease: Secondary | ICD-10-CM | POA: Diagnosis not present

## 2016-10-03 DIAGNOSIS — E1129 Type 2 diabetes mellitus with other diabetic kidney complication: Secondary | ICD-10-CM | POA: Diagnosis not present

## 2016-10-04 ENCOUNTER — Ambulatory Visit (INDEPENDENT_AMBULATORY_CARE_PROVIDER_SITE_OTHER): Payer: Medicare Other | Admitting: Pharmacist Clinician (PhC)/ Clinical Pharmacy Specialist

## 2016-10-04 DIAGNOSIS — Z7901 Long term (current) use of anticoagulants: Secondary | ICD-10-CM

## 2016-10-04 DIAGNOSIS — I480 Paroxysmal atrial fibrillation: Secondary | ICD-10-CM

## 2016-10-04 LAB — PROTIME-INR: INR: 1.2 — AB (ref 0.9–1.1)

## 2016-10-05 DIAGNOSIS — N2581 Secondary hyperparathyroidism of renal origin: Secondary | ICD-10-CM | POA: Diagnosis not present

## 2016-10-05 DIAGNOSIS — E1129 Type 2 diabetes mellitus with other diabetic kidney complication: Secondary | ICD-10-CM | POA: Diagnosis not present

## 2016-10-05 DIAGNOSIS — N186 End stage renal disease: Secondary | ICD-10-CM | POA: Diagnosis not present

## 2016-10-05 DIAGNOSIS — Z992 Dependence on renal dialysis: Secondary | ICD-10-CM | POA: Diagnosis not present

## 2016-10-08 DIAGNOSIS — N186 End stage renal disease: Secondary | ICD-10-CM | POA: Diagnosis not present

## 2016-10-08 DIAGNOSIS — N2581 Secondary hyperparathyroidism of renal origin: Secondary | ICD-10-CM | POA: Diagnosis not present

## 2016-10-08 DIAGNOSIS — Z992 Dependence on renal dialysis: Secondary | ICD-10-CM | POA: Diagnosis not present

## 2016-10-08 DIAGNOSIS — E1129 Type 2 diabetes mellitus with other diabetic kidney complication: Secondary | ICD-10-CM | POA: Diagnosis not present

## 2016-10-10 ENCOUNTER — Other Ambulatory Visit: Payer: Self-pay

## 2016-10-10 DIAGNOSIS — N2581 Secondary hyperparathyroidism of renal origin: Secondary | ICD-10-CM | POA: Diagnosis not present

## 2016-10-10 DIAGNOSIS — N186 End stage renal disease: Secondary | ICD-10-CM | POA: Diagnosis not present

## 2016-10-10 DIAGNOSIS — E1129 Type 2 diabetes mellitus with other diabetic kidney complication: Secondary | ICD-10-CM | POA: Diagnosis not present

## 2016-10-10 DIAGNOSIS — Z992 Dependence on renal dialysis: Secondary | ICD-10-CM | POA: Diagnosis not present

## 2016-10-10 NOTE — Patient Outreach (Signed)
60 day transition of care follow up/ case closure:  Placed call to follow up after 60 days of discharge from SNF for a knee replacement. Patient reports that he is doing well. Reports he is no longer using wheelchair and is using crutches. Reports that he continues to get stronger.  Denies any recent falls. Denies any new problems or concerns. Reports dialysis is going well.  Plan: Reviewed case closure and patient is in agreement. Will send letter to patient and MD. Will notify MD Will send in basket to care management assistant to close case.  Tomasa Rand, RN, BSN, CEN [facility] [employer] [phone]

## 2016-10-11 ENCOUNTER — Ambulatory Visit: Payer: Self-pay

## 2016-10-11 ENCOUNTER — Telehealth: Payer: Self-pay | Admitting: Pharmacist Clinician (PhC)/ Clinical Pharmacy Specialist

## 2016-10-11 NOTE — Telephone Encounter (Signed)
Coumadin letter 

## 2016-10-12 DIAGNOSIS — N186 End stage renal disease: Secondary | ICD-10-CM | POA: Diagnosis not present

## 2016-10-12 DIAGNOSIS — I120 Hypertensive chronic kidney disease with stage 5 chronic kidney disease or end stage renal disease: Secondary | ICD-10-CM | POA: Diagnosis not present

## 2016-10-12 DIAGNOSIS — E1129 Type 2 diabetes mellitus with other diabetic kidney complication: Secondary | ICD-10-CM | POA: Diagnosis not present

## 2016-10-12 DIAGNOSIS — N2581 Secondary hyperparathyroidism of renal origin: Secondary | ICD-10-CM | POA: Diagnosis not present

## 2016-10-12 DIAGNOSIS — Z992 Dependence on renal dialysis: Secondary | ICD-10-CM | POA: Diagnosis not present

## 2016-10-15 DIAGNOSIS — N186 End stage renal disease: Secondary | ICD-10-CM | POA: Diagnosis not present

## 2016-10-15 DIAGNOSIS — D631 Anemia in chronic kidney disease: Secondary | ICD-10-CM | POA: Diagnosis not present

## 2016-10-15 DIAGNOSIS — E875 Hyperkalemia: Secondary | ICD-10-CM | POA: Diagnosis not present

## 2016-10-15 DIAGNOSIS — N2581 Secondary hyperparathyroidism of renal origin: Secondary | ICD-10-CM | POA: Diagnosis not present

## 2016-10-15 DIAGNOSIS — R509 Fever, unspecified: Secondary | ICD-10-CM | POA: Diagnosis not present

## 2016-10-15 DIAGNOSIS — Z23 Encounter for immunization: Secondary | ICD-10-CM | POA: Diagnosis not present

## 2016-10-15 DIAGNOSIS — Z992 Dependence on renal dialysis: Secondary | ICD-10-CM | POA: Diagnosis not present

## 2016-10-15 DIAGNOSIS — E1129 Type 2 diabetes mellitus with other diabetic kidney complication: Secondary | ICD-10-CM | POA: Diagnosis not present

## 2016-10-17 DIAGNOSIS — E1129 Type 2 diabetes mellitus with other diabetic kidney complication: Secondary | ICD-10-CM | POA: Diagnosis not present

## 2016-10-17 DIAGNOSIS — E875 Hyperkalemia: Secondary | ICD-10-CM | POA: Diagnosis not present

## 2016-10-17 DIAGNOSIS — Z992 Dependence on renal dialysis: Secondary | ICD-10-CM | POA: Diagnosis not present

## 2016-10-17 DIAGNOSIS — N186 End stage renal disease: Secondary | ICD-10-CM | POA: Diagnosis not present

## 2016-10-17 DIAGNOSIS — N2581 Secondary hyperparathyroidism of renal origin: Secondary | ICD-10-CM | POA: Diagnosis not present

## 2016-10-17 DIAGNOSIS — R509 Fever, unspecified: Secondary | ICD-10-CM | POA: Diagnosis not present

## 2016-10-19 DIAGNOSIS — R509 Fever, unspecified: Secondary | ICD-10-CM | POA: Diagnosis not present

## 2016-10-19 DIAGNOSIS — Z992 Dependence on renal dialysis: Secondary | ICD-10-CM | POA: Diagnosis not present

## 2016-10-19 DIAGNOSIS — N186 End stage renal disease: Secondary | ICD-10-CM | POA: Diagnosis not present

## 2016-10-19 DIAGNOSIS — E1129 Type 2 diabetes mellitus with other diabetic kidney complication: Secondary | ICD-10-CM | POA: Diagnosis not present

## 2016-10-19 DIAGNOSIS — N2581 Secondary hyperparathyroidism of renal origin: Secondary | ICD-10-CM | POA: Diagnosis not present

## 2016-10-19 DIAGNOSIS — E875 Hyperkalemia: Secondary | ICD-10-CM | POA: Diagnosis not present

## 2016-10-22 DIAGNOSIS — E875 Hyperkalemia: Secondary | ICD-10-CM | POA: Diagnosis not present

## 2016-10-22 DIAGNOSIS — E1129 Type 2 diabetes mellitus with other diabetic kidney complication: Secondary | ICD-10-CM | POA: Diagnosis not present

## 2016-10-22 DIAGNOSIS — N2581 Secondary hyperparathyroidism of renal origin: Secondary | ICD-10-CM | POA: Diagnosis not present

## 2016-10-22 DIAGNOSIS — N186 End stage renal disease: Secondary | ICD-10-CM | POA: Diagnosis not present

## 2016-10-22 DIAGNOSIS — R509 Fever, unspecified: Secondary | ICD-10-CM | POA: Diagnosis not present

## 2016-10-22 DIAGNOSIS — Z992 Dependence on renal dialysis: Secondary | ICD-10-CM | POA: Diagnosis not present

## 2016-10-24 DIAGNOSIS — Z992 Dependence on renal dialysis: Secondary | ICD-10-CM | POA: Diagnosis not present

## 2016-10-24 DIAGNOSIS — E875 Hyperkalemia: Secondary | ICD-10-CM | POA: Diagnosis not present

## 2016-10-24 DIAGNOSIS — R509 Fever, unspecified: Secondary | ICD-10-CM | POA: Diagnosis not present

## 2016-10-24 DIAGNOSIS — N2581 Secondary hyperparathyroidism of renal origin: Secondary | ICD-10-CM | POA: Diagnosis not present

## 2016-10-24 DIAGNOSIS — N186 End stage renal disease: Secondary | ICD-10-CM | POA: Diagnosis not present

## 2016-10-24 DIAGNOSIS — E1129 Type 2 diabetes mellitus with other diabetic kidney complication: Secondary | ICD-10-CM | POA: Diagnosis not present

## 2016-10-26 DIAGNOSIS — E875 Hyperkalemia: Secondary | ICD-10-CM | POA: Diagnosis not present

## 2016-10-26 DIAGNOSIS — R509 Fever, unspecified: Secondary | ICD-10-CM | POA: Diagnosis not present

## 2016-10-26 DIAGNOSIS — Z992 Dependence on renal dialysis: Secondary | ICD-10-CM | POA: Diagnosis not present

## 2016-10-26 DIAGNOSIS — N186 End stage renal disease: Secondary | ICD-10-CM | POA: Diagnosis not present

## 2016-10-26 DIAGNOSIS — E1129 Type 2 diabetes mellitus with other diabetic kidney complication: Secondary | ICD-10-CM | POA: Diagnosis not present

## 2016-10-26 DIAGNOSIS — N2581 Secondary hyperparathyroidism of renal origin: Secondary | ICD-10-CM | POA: Diagnosis not present

## 2016-10-30 DIAGNOSIS — N186 End stage renal disease: Secondary | ICD-10-CM | POA: Diagnosis not present

## 2016-10-30 DIAGNOSIS — N2581 Secondary hyperparathyroidism of renal origin: Secondary | ICD-10-CM | POA: Diagnosis not present

## 2016-10-30 DIAGNOSIS — E1129 Type 2 diabetes mellitus with other diabetic kidney complication: Secondary | ICD-10-CM | POA: Diagnosis not present

## 2016-10-30 DIAGNOSIS — E875 Hyperkalemia: Secondary | ICD-10-CM | POA: Diagnosis not present

## 2016-10-30 DIAGNOSIS — Z992 Dependence on renal dialysis: Secondary | ICD-10-CM | POA: Diagnosis not present

## 2016-10-30 DIAGNOSIS — R509 Fever, unspecified: Secondary | ICD-10-CM | POA: Diagnosis not present

## 2016-10-31 DIAGNOSIS — N186 End stage renal disease: Secondary | ICD-10-CM | POA: Diagnosis not present

## 2016-10-31 DIAGNOSIS — R509 Fever, unspecified: Secondary | ICD-10-CM | POA: Diagnosis not present

## 2016-10-31 DIAGNOSIS — N2581 Secondary hyperparathyroidism of renal origin: Secondary | ICD-10-CM | POA: Diagnosis not present

## 2016-10-31 DIAGNOSIS — E875 Hyperkalemia: Secondary | ICD-10-CM | POA: Diagnosis not present

## 2016-10-31 DIAGNOSIS — Z992 Dependence on renal dialysis: Secondary | ICD-10-CM | POA: Diagnosis not present

## 2016-10-31 DIAGNOSIS — E1129 Type 2 diabetes mellitus with other diabetic kidney complication: Secondary | ICD-10-CM | POA: Diagnosis not present

## 2016-11-02 DIAGNOSIS — E875 Hyperkalemia: Secondary | ICD-10-CM | POA: Diagnosis not present

## 2016-11-02 DIAGNOSIS — E1129 Type 2 diabetes mellitus with other diabetic kidney complication: Secondary | ICD-10-CM | POA: Diagnosis not present

## 2016-11-02 DIAGNOSIS — R509 Fever, unspecified: Secondary | ICD-10-CM | POA: Diagnosis not present

## 2016-11-02 DIAGNOSIS — N186 End stage renal disease: Secondary | ICD-10-CM | POA: Diagnosis not present

## 2016-11-02 DIAGNOSIS — Z992 Dependence on renal dialysis: Secondary | ICD-10-CM | POA: Diagnosis not present

## 2016-11-02 DIAGNOSIS — N2581 Secondary hyperparathyroidism of renal origin: Secondary | ICD-10-CM | POA: Diagnosis not present

## 2016-11-05 DIAGNOSIS — Z992 Dependence on renal dialysis: Secondary | ICD-10-CM | POA: Diagnosis not present

## 2016-11-05 DIAGNOSIS — N186 End stage renal disease: Secondary | ICD-10-CM | POA: Diagnosis not present

## 2016-11-05 DIAGNOSIS — E1129 Type 2 diabetes mellitus with other diabetic kidney complication: Secondary | ICD-10-CM | POA: Diagnosis not present

## 2016-11-05 DIAGNOSIS — E875 Hyperkalemia: Secondary | ICD-10-CM | POA: Diagnosis not present

## 2016-11-05 DIAGNOSIS — R509 Fever, unspecified: Secondary | ICD-10-CM | POA: Diagnosis not present

## 2016-11-05 DIAGNOSIS — N2581 Secondary hyperparathyroidism of renal origin: Secondary | ICD-10-CM | POA: Diagnosis not present

## 2016-11-07 DIAGNOSIS — N186 End stage renal disease: Secondary | ICD-10-CM | POA: Diagnosis not present

## 2016-11-07 DIAGNOSIS — R509 Fever, unspecified: Secondary | ICD-10-CM | POA: Diagnosis not present

## 2016-11-07 DIAGNOSIS — E875 Hyperkalemia: Secondary | ICD-10-CM | POA: Diagnosis not present

## 2016-11-07 DIAGNOSIS — E1129 Type 2 diabetes mellitus with other diabetic kidney complication: Secondary | ICD-10-CM | POA: Diagnosis not present

## 2016-11-07 DIAGNOSIS — I4891 Unspecified atrial fibrillation: Secondary | ICD-10-CM | POA: Diagnosis not present

## 2016-11-07 DIAGNOSIS — Z992 Dependence on renal dialysis: Secondary | ICD-10-CM | POA: Diagnosis not present

## 2016-11-07 DIAGNOSIS — N2581 Secondary hyperparathyroidism of renal origin: Secondary | ICD-10-CM | POA: Diagnosis not present

## 2016-11-07 LAB — PROTIME-INR

## 2016-11-09 DIAGNOSIS — R509 Fever, unspecified: Secondary | ICD-10-CM | POA: Diagnosis not present

## 2016-11-09 DIAGNOSIS — E1129 Type 2 diabetes mellitus with other diabetic kidney complication: Secondary | ICD-10-CM | POA: Diagnosis not present

## 2016-11-09 DIAGNOSIS — N186 End stage renal disease: Secondary | ICD-10-CM | POA: Diagnosis not present

## 2016-11-09 DIAGNOSIS — E875 Hyperkalemia: Secondary | ICD-10-CM | POA: Diagnosis not present

## 2016-11-09 DIAGNOSIS — Z992 Dependence on renal dialysis: Secondary | ICD-10-CM | POA: Diagnosis not present

## 2016-11-09 DIAGNOSIS — N2581 Secondary hyperparathyroidism of renal origin: Secondary | ICD-10-CM | POA: Diagnosis not present

## 2016-11-11 DIAGNOSIS — N186 End stage renal disease: Secondary | ICD-10-CM | POA: Diagnosis not present

## 2016-11-11 DIAGNOSIS — Z992 Dependence on renal dialysis: Secondary | ICD-10-CM | POA: Diagnosis not present

## 2016-11-11 DIAGNOSIS — I120 Hypertensive chronic kidney disease with stage 5 chronic kidney disease or end stage renal disease: Secondary | ICD-10-CM | POA: Diagnosis not present

## 2016-11-12 DIAGNOSIS — E1129 Type 2 diabetes mellitus with other diabetic kidney complication: Secondary | ICD-10-CM | POA: Diagnosis not present

## 2016-11-12 DIAGNOSIS — N186 End stage renal disease: Secondary | ICD-10-CM | POA: Diagnosis not present

## 2016-11-12 DIAGNOSIS — N2581 Secondary hyperparathyroidism of renal origin: Secondary | ICD-10-CM | POA: Diagnosis not present

## 2016-11-12 DIAGNOSIS — D631 Anemia in chronic kidney disease: Secondary | ICD-10-CM | POA: Diagnosis not present

## 2016-11-12 DIAGNOSIS — Z992 Dependence on renal dialysis: Secondary | ICD-10-CM | POA: Diagnosis not present

## 2016-11-14 DIAGNOSIS — E1129 Type 2 diabetes mellitus with other diabetic kidney complication: Secondary | ICD-10-CM | POA: Diagnosis not present

## 2016-11-14 DIAGNOSIS — N186 End stage renal disease: Secondary | ICD-10-CM | POA: Diagnosis not present

## 2016-11-14 DIAGNOSIS — N2581 Secondary hyperparathyroidism of renal origin: Secondary | ICD-10-CM | POA: Diagnosis not present

## 2016-11-14 DIAGNOSIS — Z992 Dependence on renal dialysis: Secondary | ICD-10-CM | POA: Diagnosis not present

## 2016-11-14 DIAGNOSIS — D631 Anemia in chronic kidney disease: Secondary | ICD-10-CM | POA: Diagnosis not present

## 2016-11-16 DIAGNOSIS — E1129 Type 2 diabetes mellitus with other diabetic kidney complication: Secondary | ICD-10-CM | POA: Diagnosis not present

## 2016-11-16 DIAGNOSIS — Z992 Dependence on renal dialysis: Secondary | ICD-10-CM | POA: Diagnosis not present

## 2016-11-16 DIAGNOSIS — N186 End stage renal disease: Secondary | ICD-10-CM | POA: Diagnosis not present

## 2016-11-16 DIAGNOSIS — N2581 Secondary hyperparathyroidism of renal origin: Secondary | ICD-10-CM | POA: Diagnosis not present

## 2016-11-16 DIAGNOSIS — D631 Anemia in chronic kidney disease: Secondary | ICD-10-CM | POA: Diagnosis not present

## 2016-11-21 DIAGNOSIS — D631 Anemia in chronic kidney disease: Secondary | ICD-10-CM | POA: Diagnosis not present

## 2016-11-21 DIAGNOSIS — E1129 Type 2 diabetes mellitus with other diabetic kidney complication: Secondary | ICD-10-CM | POA: Diagnosis not present

## 2016-11-21 DIAGNOSIS — N186 End stage renal disease: Secondary | ICD-10-CM | POA: Diagnosis not present

## 2016-11-21 DIAGNOSIS — Z992 Dependence on renal dialysis: Secondary | ICD-10-CM | POA: Diagnosis not present

## 2016-11-21 DIAGNOSIS — N2581 Secondary hyperparathyroidism of renal origin: Secondary | ICD-10-CM | POA: Diagnosis not present

## 2016-11-23 DIAGNOSIS — Z992 Dependence on renal dialysis: Secondary | ICD-10-CM | POA: Diagnosis not present

## 2016-11-23 DIAGNOSIS — D631 Anemia in chronic kidney disease: Secondary | ICD-10-CM | POA: Diagnosis not present

## 2016-11-23 DIAGNOSIS — N2581 Secondary hyperparathyroidism of renal origin: Secondary | ICD-10-CM | POA: Diagnosis not present

## 2016-11-23 DIAGNOSIS — E1129 Type 2 diabetes mellitus with other diabetic kidney complication: Secondary | ICD-10-CM | POA: Diagnosis not present

## 2016-11-23 DIAGNOSIS — N186 End stage renal disease: Secondary | ICD-10-CM | POA: Diagnosis not present

## 2016-11-26 DIAGNOSIS — E1129 Type 2 diabetes mellitus with other diabetic kidney complication: Secondary | ICD-10-CM | POA: Diagnosis not present

## 2016-11-26 DIAGNOSIS — D631 Anemia in chronic kidney disease: Secondary | ICD-10-CM | POA: Diagnosis not present

## 2016-11-26 DIAGNOSIS — N186 End stage renal disease: Secondary | ICD-10-CM | POA: Diagnosis not present

## 2016-11-26 DIAGNOSIS — Z992 Dependence on renal dialysis: Secondary | ICD-10-CM | POA: Diagnosis not present

## 2016-11-26 DIAGNOSIS — N2581 Secondary hyperparathyroidism of renal origin: Secondary | ICD-10-CM | POA: Diagnosis not present

## 2016-11-27 ENCOUNTER — Other Ambulatory Visit: Payer: Self-pay | Admitting: Nurse Practitioner

## 2016-11-27 ENCOUNTER — Other Ambulatory Visit: Payer: Self-pay | Admitting: Internal Medicine

## 2016-11-27 ENCOUNTER — Encounter (HOSPITAL_BASED_OUTPATIENT_CLINIC_OR_DEPARTMENT_OTHER): Payer: Medicare Other

## 2016-11-27 DIAGNOSIS — I480 Paroxysmal atrial fibrillation: Secondary | ICD-10-CM

## 2016-11-27 NOTE — Telephone Encounter (Signed)
Last INR done at dialysis 11/07/2016 2.06, copy received 11/21/16.   Next INR ordered for 11/28/2016 at dialysis - talked to RN as [employer]

## 2016-11-28 DIAGNOSIS — N2581 Secondary hyperparathyroidism of renal origin: Secondary | ICD-10-CM | POA: Diagnosis not present

## 2016-11-28 DIAGNOSIS — Z992 Dependence on renal dialysis: Secondary | ICD-10-CM | POA: Diagnosis not present

## 2016-11-28 DIAGNOSIS — I4891 Unspecified atrial fibrillation: Secondary | ICD-10-CM | POA: Diagnosis not present

## 2016-11-28 DIAGNOSIS — N186 End stage renal disease: Secondary | ICD-10-CM | POA: Diagnosis not present

## 2016-11-28 DIAGNOSIS — E1129 Type 2 diabetes mellitus with other diabetic kidney complication: Secondary | ICD-10-CM | POA: Diagnosis not present

## 2016-11-28 DIAGNOSIS — D631 Anemia in chronic kidney disease: Secondary | ICD-10-CM | POA: Diagnosis not present

## 2016-11-28 LAB — PROTIME-INR: INR: 3.1 — AB (ref <=1.1)

## 2016-11-29 ENCOUNTER — Inpatient Hospital Stay (HOSPITAL_COMMUNITY)
Admission: EM | Admit: 2016-11-29 | Discharge: 2016-12-01 | DRG: 064 | Disposition: A | Discharge status: Home / Self Care | Payer: Medicare Other | Attending: Internal Medicine | Admitting: Internal Medicine

## 2016-11-29 ENCOUNTER — Encounter (HOSPITAL_COMMUNITY): Payer: Self-pay

## 2016-11-29 ENCOUNTER — Emergency Department (HOSPITAL_COMMUNITY): Payer: Medicare Other

## 2016-11-29 ENCOUNTER — Ambulatory Visit (INDEPENDENT_AMBULATORY_CARE_PROVIDER_SITE_OTHER): Payer: Medicare Other | Admitting: Pharmacist

## 2016-11-29 DIAGNOSIS — N2581 Secondary hyperparathyroidism of renal origin: Secondary | ICD-10-CM | POA: Diagnosis not present

## 2016-11-29 DIAGNOSIS — Z7901 Long term (current) use of anticoagulants: Secondary | ICD-10-CM

## 2016-11-29 DIAGNOSIS — Z833 Family history of diabetes mellitus: Secondary | ICD-10-CM

## 2016-11-29 DIAGNOSIS — N186 End stage renal disease: Secondary | ICD-10-CM | POA: Diagnosis not present

## 2016-11-29 DIAGNOSIS — I619 Nontraumatic intracerebral hemorrhage, unspecified: Secondary | ICD-10-CM | POA: Diagnosis not present

## 2016-11-29 DIAGNOSIS — I6201 Nontraumatic acute subdural hemorrhage: Secondary | ICD-10-CM | POA: Diagnosis not present

## 2016-11-29 DIAGNOSIS — I5042 Chronic combined systolic (congestive) and diastolic (congestive) heart failure: Secondary | ICD-10-CM | POA: Diagnosis present

## 2016-11-29 DIAGNOSIS — I10 Essential (primary) hypertension: Secondary | ICD-10-CM

## 2016-11-29 DIAGNOSIS — Z6835 Body mass index (BMI) 35.0-35.9, adult: Secondary | ICD-10-CM

## 2016-11-29 DIAGNOSIS — Z79899 Other long term (current) drug therapy: Secondary | ICD-10-CM | POA: Diagnosis not present

## 2016-11-29 DIAGNOSIS — Z8249 Family history of ischemic heart disease and other diseases of the circulatory system: Secondary | ICD-10-CM

## 2016-11-29 DIAGNOSIS — Z96653 Presence of artificial knee joint, bilateral: Secondary | ICD-10-CM | POA: Diagnosis present

## 2016-11-29 DIAGNOSIS — E11319 Type 2 diabetes mellitus with unspecified diabetic retinopathy without macular edema: Secondary | ICD-10-CM | POA: Diagnosis present

## 2016-11-29 DIAGNOSIS — E1122 Type 2 diabetes mellitus with diabetic chronic kidney disease: Secondary | ICD-10-CM | POA: Diagnosis present

## 2016-11-29 DIAGNOSIS — M898X9 Other specified disorders of bone, unspecified site: Secondary | ICD-10-CM | POA: Diagnosis present

## 2016-11-29 DIAGNOSIS — Z91048 Other nonmedicinal substance allergy status: Secondary | ICD-10-CM

## 2016-11-29 DIAGNOSIS — S065X9A Traumatic subdural hemorrhage with loss of consciousness of unspecified duration, initial encounter: Secondary | ICD-10-CM | POA: Diagnosis present

## 2016-11-29 DIAGNOSIS — I272 Pulmonary hypertension, unspecified: Secondary | ICD-10-CM | POA: Diagnosis present

## 2016-11-29 DIAGNOSIS — I252 Old myocardial infarction: Secondary | ICD-10-CM | POA: Diagnosis not present

## 2016-11-29 DIAGNOSIS — M21961 Unspecified acquired deformity of right lower leg: Secondary | ICD-10-CM | POA: Diagnosis not present

## 2016-11-29 DIAGNOSIS — I481 Persistent atrial fibrillation: Secondary | ICD-10-CM | POA: Diagnosis not present

## 2016-11-29 DIAGNOSIS — E1351 Other specified diabetes mellitus with diabetic peripheral angiopathy without gangrene: Secondary | ICD-10-CM | POA: Diagnosis not present

## 2016-11-29 DIAGNOSIS — I132 Hypertensive heart and chronic kidney disease with heart failure and with stage 5 chronic kidney disease, or end stage renal disease: Secondary | ICD-10-CM | POA: Diagnosis present

## 2016-11-29 DIAGNOSIS — G6289 Other specified polyneuropathies: Secondary | ICD-10-CM

## 2016-11-29 DIAGNOSIS — D631 Anemia in chronic kidney disease: Secondary | ICD-10-CM | POA: Diagnosis present

## 2016-11-29 DIAGNOSIS — I480 Paroxysmal atrial fibrillation: Secondary | ICD-10-CM

## 2016-11-29 DIAGNOSIS — S065XAA Traumatic subdural hemorrhage with loss of consciousness status unknown, initial encounter: Secondary | ICD-10-CM | POA: Diagnosis present

## 2016-11-29 DIAGNOSIS — L602 Onychogryphosis: Secondary | ICD-10-CM | POA: Diagnosis not present

## 2016-11-29 DIAGNOSIS — M21962 Unspecified acquired deformity of left lower leg: Secondary | ICD-10-CM | POA: Diagnosis not present

## 2016-11-29 DIAGNOSIS — Z8673 Personal history of transient ischemic attack (TIA), and cerebral infarction without residual deficits: Secondary | ICD-10-CM

## 2016-11-29 DIAGNOSIS — Z992 Dependence on renal dialysis: Secondary | ICD-10-CM | POA: Diagnosis not present

## 2016-11-29 DIAGNOSIS — M1990 Unspecified osteoarthritis, unspecified site: Secondary | ICD-10-CM | POA: Diagnosis present

## 2016-11-29 DIAGNOSIS — G4733 Obstructive sleep apnea (adult) (pediatric): Secondary | ICD-10-CM | POA: Diagnosis not present

## 2016-11-29 DIAGNOSIS — I120 Hypertensive chronic kidney disease with stage 5 chronic kidney disease or end stage renal disease: Secondary | ICD-10-CM | POA: Diagnosis not present

## 2016-11-29 DIAGNOSIS — M109 Gout, unspecified: Secondary | ICD-10-CM | POA: Diagnosis present

## 2016-11-29 DIAGNOSIS — E1140 Type 2 diabetes mellitus with diabetic neuropathy, unspecified: Secondary | ICD-10-CM | POA: Diagnosis present

## 2016-11-29 DIAGNOSIS — I6200 Nontraumatic subdural hemorrhage, unspecified: Secondary | ICD-10-CM | POA: Diagnosis not present

## 2016-11-29 DIAGNOSIS — E669 Obesity, unspecified: Secondary | ICD-10-CM | POA: Diagnosis present

## 2016-11-29 DIAGNOSIS — Z96641 Presence of right artificial hip joint: Secondary | ICD-10-CM | POA: Diagnosis present

## 2016-11-29 DIAGNOSIS — R51 Headache: Secondary | ICD-10-CM | POA: Diagnosis not present

## 2016-11-29 DIAGNOSIS — E785 Hyperlipidemia, unspecified: Secondary | ICD-10-CM | POA: Diagnosis present

## 2016-11-29 LAB — CBC
HEMATOCRIT: 37.8 % — AB (ref 39.0–52.0)
Hemoglobin: 11.9 g/dL — ABNORMAL LOW (ref 13.0–17.0)
MCH: 30.3 pg (ref 26.0–34.0)
MCHC: 31.5 g/dL (ref 30.0–36.0)
MCV: 96.2 fL (ref 78.0–100.0)
PLATELETS: 217 10*3/uL (ref 150–400)
RBC: 3.93 MIL/uL — AB (ref 4.22–5.81)
RDW: 16.2 % — AB (ref 11.5–15.5)
WBC: 7.5 10*3/uL (ref 4.0–10.5)

## 2016-11-29 LAB — COMPREHENSIVE METABOLIC PANEL
ALBUMIN: 3.4 g/dL — AB (ref 3.5–5.0)
ALT: 13 U/L — ABNORMAL LOW (ref 17–63)
ANION GAP: 15 (ref 5–15)
AST: 20 U/L (ref 15–41)
Alkaline Phosphatase: 109 U/L (ref 38–126)
BUN: 28 mg/dL — AB (ref 6–20)
CHLORIDE: 94 mmol/L — AB (ref 101–111)
CO2: 23 mmol/L (ref 22–32)
Calcium: 9 mg/dL (ref 8.9–10.3)
Creatinine, Ser: 7.26 mg/dL — ABNORMAL HIGH (ref 0.61–1.24)
GFR calc Af Amer: 8 mL/min — ABNORMAL LOW (ref >60)
GFR calc non Af Amer: 7 mL/min — ABNORMAL LOW (ref >60)
GLUCOSE: 106 mg/dL — AB (ref 65–99)
POTASSIUM: 4 mmol/L (ref 3.5–5.1)
Sodium: 132 mmol/L — ABNORMAL LOW (ref 135–145)
TOTAL PROTEIN: 8 g/dL (ref 6.5–8.1)
Total Bilirubin: 0.7 mg/dL (ref 0.3–1.2)

## 2016-11-29 LAB — PROTIME-INR
INR: 3.02
Prothrombin Time: 31 seconds — ABNORMAL HIGH (ref 11.4–15.2)

## 2016-11-29 LAB — LIPASE, BLOOD: LIPASE: 50 U/L (ref 11–51)

## 2016-11-29 MED ORDER — DM-GUAIFENESIN ER 30-600 MG PO TB12: 1.0000 | ORAL_TABLET | Freq: Every day | ORAL | Status: DC | PRN

## 2016-11-29 MED ORDER — AMITRIPTYLINE HCL 25 MG PO TABS
50.0000 mg | ORAL_TABLET | Freq: Every day | ORAL | Status: DC
  Administered 2016-11-30 (×2): 50 mg via ORAL
  Filled 2016-11-29 (×2): qty 2

## 2016-11-29 MED ORDER — PROCHLORPERAZINE EDISYLATE 5 MG/ML IJ SOLN
10.0000 mg | Freq: Once | INTRAMUSCULAR | Status: AC
  Administered 2016-11-29: 10 mg via INTRAVENOUS
  Filled 2016-11-29: qty 2

## 2016-11-29 MED ORDER — GABAPENTIN 100 MG PO CAPS
200.0000 mg | ORAL_CAPSULE | Freq: Every day | ORAL | Status: DC
  Administered 2016-11-30 – 2016-12-01 (×2): 200 mg via ORAL
  Filled 2016-11-29 (×3): qty 2

## 2016-11-29 MED ORDER — SIMVASTATIN 20 MG PO TABS
20.0000 mg | ORAL_TABLET | Freq: Every day | ORAL | Status: DC
  Administered 2016-11-30 (×2): 20 mg via ORAL
  Filled 2016-11-29: qty 1

## 2016-11-29 MED ORDER — ONDANSETRON HCL 4 MG PO TABS: 4.0000 mg | ORAL_TABLET | Freq: Four times a day (QID) | ORAL | Status: DC | PRN

## 2016-11-29 MED ORDER — OXYCODONE-ACETAMINOPHEN 5-325 MG PO TABS
1.0000 | ORAL_TABLET | Freq: Three times a day (TID) | ORAL | Status: DC | PRN
  Administered 2016-11-30: 1 via ORAL
  Filled 2016-11-29: qty 1

## 2016-11-29 MED ORDER — RENA-VITE PO TABS
1.0000 | ORAL_TABLET | Freq: Every day | ORAL | Status: DC
  Administered 2016-11-30 (×2): 1 via ORAL
  Filled 2016-11-29 (×2): qty 1

## 2016-11-29 MED ORDER — ACETAMINOPHEN 650 MG RE SUPP: 650.0000 mg | Freq: Four times a day (QID) | RECTAL | Status: DC | PRN

## 2016-11-29 MED ORDER — DIPHENHYDRAMINE HCL 50 MG/ML IJ SOLN
12.5000 mg | Freq: Once | INTRAMUSCULAR | Status: AC
  Administered 2016-11-29: 12.5 mg via INTRAVENOUS
  Filled 2016-11-29: qty 1

## 2016-11-29 MED ORDER — ONDANSETRON HCL 4 MG/2ML IJ SOLN: 4.0000 mg | Freq: Four times a day (QID) | INTRAMUSCULAR | Status: DC | PRN

## 2016-11-29 MED ORDER — ACETAMINOPHEN 325 MG PO TABS
650.0000 mg | ORAL_TABLET | Freq: Four times a day (QID) | ORAL | Status: DC | PRN
  Administered 2016-12-01: 650 mg via ORAL
  Filled 2016-11-29: qty 2

## 2016-11-29 MED ORDER — SENNOSIDES-DOCUSATE SODIUM 8.6-50 MG PO TABS: 1.0000 | ORAL_TABLET | Freq: Every day | ORAL | Status: DC | PRN

## 2016-11-29 MED ORDER — CARVEDILOL 12.5 MG PO TABS: 25.0000 mg | ORAL_TABLET | ORAL | Status: DC

## 2016-11-29 MED ORDER — SALINE SPRAY 0.65 % NA SOLN
1.0000 | NASAL | Status: DC | PRN
  Administered 2016-12-01: 1 via NASAL
  Filled 2016-11-29: qty 44

## 2016-11-29 MED ORDER — ALLOPURINOL 100 MG PO TABS
100.0000 mg | ORAL_TABLET | Freq: Two times a day (BID) | ORAL | Status: DC
  Administered 2016-11-30 – 2016-12-01 (×4): 100 mg via ORAL
  Filled 2016-11-29 (×4): qty 1

## 2016-11-29 MED ORDER — PROTHROMBIN COMPLEX CONC HUMAN 500 UNITS IV KIT
2874.0000 [IU] | PACK | INTRAVENOUS | Status: AC
  Administered 2016-11-29: 2874 [IU] via INTRAVENOUS
  Filled 2016-11-29: qty 2874

## 2016-11-29 MED ORDER — ACETAMINOPHEN ER 650 MG PO TBCR: 650.0000 mg | EXTENDED_RELEASE_TABLET | Freq: Three times a day (TID) | ORAL | Status: DC | PRN

## 2016-11-29 MED ORDER — SODIUM CHLORIDE 0.9 % IV BOLUS (SEPSIS)
250.0000 mL | Freq: Once | INTRAVENOUS | Status: AC
  Administered 2016-11-29: 250 mL via INTRAVENOUS

## 2016-11-29 MED ORDER — VITAMIN K1 10 MG/ML IJ SOLN
10.0000 mg | INTRAVENOUS | Status: AC
  Filled 2016-11-29: qty 1

## 2016-11-29 MED ORDER — DOCUSATE SODIUM 100 MG PO CAPS: 100.0000 mg | ORAL_CAPSULE | Freq: Every day | ORAL | Status: DC | PRN

## 2016-11-29 NOTE — ED Triage Notes (Signed)
Frontal headache since yesterday morning not relieved by tylenol or ice. Pt is dialysis pt MWF and states he feels like the "bells" a dialysis are causing his headache. Denies blurred vision, photosensitivity.

## 2016-11-29 NOTE — H&P (Signed)
History and Physical    Daniel Whitney JKD:[id number] DOB: 1955/10/26 DOA: 11/29/2016  PCP: [name], MD  Patient coming from: Home.  Chief Complaint: Headache.  HPI: Daniel Whitney is a 61 y.o. male with history of atrial fibrillation on Coumadin, ESRD on hemodialysis, hypertension, anemia and diet-controlled diabetes who presents to the ER with complaints of persistent headache.  Patient has been having a frontal headache for last 2 days which has been worsening with severe intensity.  Patient also has had at least 4 episodes of nausea vomiting.  Denies any weakness of the upper or lower extremities.  Patient states that he did have a fall 2 weeks ago but at that time he did not hit his head or lose consciousness.  ED Course: Due to persistent headache patient presented to the ER.  CT of the head shows a right subdural hematoma 6 mm with no midline shift.  On-call neurosurgeon was consulted and at this time they requested repeat CT head and to hold off Coumadin and reverse.  On my exam patient appears nonfocal and headache is improved at this time after pain relief medications.  Review of Systems: As per HPI, rest all negative.   Past Medical History:  Diagnosis Date  . Allergic rhinitis 07/07/2014  . Anemia due to other cause 07/07/2014  . Arthritis    "all over"  . Atrial fibrillation ([place]) 12/30/2012  . CHF (congestive heart failure) ([place]) 12/30/2012  . Chronic lower back pain   . Diabetes mellitus with neuropathy ([place])   . Diabetic retinopathy ([place]) 12/30/2012   no medications  . Dysrhythmia    Afib  . ESRD (end stage renal disease) on dialysis ([place]) started in 2013   MWF; [facility]; [employer]  . GERD (gastroesophageal reflux disease)   . Glaucoma   . History of gout    "right big toe"  . Hyperlipidemia   . Hypertension   . Hypothyroid    01/17/16- no longer on meduication  . Morbid obesity ([place]) 12/30/2012  . Myocardial infarction ([place])    "I've had ~ 3; last  one was in ~ 10/2013" (01/30/2014)  . OSA (obstructive sleep apnea) 12/30/2012   "lost weight; no longer needed CPAP; retested said I needed it; didn't followup cause I was feeling fine" (01/30/2014)  . Pulmonary hypertension ([place]) 12/30/2012  . Refusal of blood transfusions as patient is Jehovah's Witness   . Stroke [facility]) ~ 2005; ~ 2005   "they were mild; I didn't even notice I'd had them"; denies residual on 01/30/2014    Past Surgical History:  Procedure Laterality Date  . AV FISTULA PLACEMENT Left ~ 10/2013   "forearm"  . CARDIAC CATHETERIZATION N/A 01/07/2015   Procedure: Left Heart Cath and Coronary Angiography;  Surgeon: Troy Sine, MD;  Location: [facility];  Service: Cardiovascular;  Laterality: N/A;  . CLOSED REDUCTION HIP DISLOCATION Right 1970's  . JOINT REPLACEMENT    . TOTAL HIP ARTHROPLASTY Right 1980  . TOTAL KNEE ARTHROPLASTY Right 01/24/2016   Procedure: TOTAL KNEE ARTHROPLASTY;  Surgeon: Dorna Leitz, MD;  Location: [facility];  Service: Orthopedics;  Laterality: Right;  . TOTAL KNEE ARTHROPLASTY Left 06/26/2016   Procedure: TOTAL KNEE ARTHROPLASTY;  Surgeon: Dorna Leitz, MD;  Location: [facility];  Service: Orthopedics;  Laterality: Left;     reports that he has never smoked. He has never used smokeless tobacco. He reports that he drinks about 4.2 - 4.8 oz of alcohol per week . He reports  that he does not use drugs.  Allergies  Allergen Reactions  . Tobacco [Nicotiana Tabacum] Shortness Of Breath    Family History  Problem Relation Age of Onset  . Arthritis Other   . Heart disease Other   . Hyperlipidemia Other   . Hypertension Other   . Kidney disease Other   . Diabetes Other   . Stroke Other   . Diabetes Mother   . Cancer Father        bone cancer  . Heart disease Father   . Diabetes Sister   . Diabetes Brother   . Stroke Brother     Prior to Admission medications   Medication Sig Start Date End Date Taking? Authorizing Provider  acetaminophen  (TYLENOL) 650 MG CR tablet Take 650 mg by mouth every 8 (eight) hours as needed for pain.   Yes [provider]  allopurinol (ZYLOPRIM) 100 MG tablet Take 1 tablet (100 mg total) by mouth 2 (two) times daily. 07/31/16  Yes Medina-Vargas, Monina C, NP  amitriptyline (ELAVIL) 50 MG tablet Take 1 tablet (50 mg total) by mouth at bedtime. 07/31/16  Yes Medina-Vargas, Monina C, NP  b complex-vitamin c-folic acid (NEPHRO-VITE) 0.8 MG TABS tablet Take 1 tablet by mouth daily.   Yes [provider]  carvedilol (COREG) 25 MG tablet Take 1 tablet (25 mg total) by mouth See admin instructions. Give 1 tablet BID on M-W-Sat, give 1 tablet qd on Tue-Thu-Sun Patient taking differently: Take 25 mg by mouth See admin instructions. Give 12.5 tablet BID on M-W-fri, give 1 tablet qd on Tue-Thu-Sun-Sat 07/31/16  Yes Medina-Vargas, Monina C, NP  Dextromethorphan-Guaifenesin (MUCINEX DM) 30-600 MG TB12 Take 1 tablet by mouth daily as needed.    Yes [provider]  docusate sodium (COLACE) 100 MG capsule Take 100 mg by mouth daily as needed.    Yes [provider]  gabapentin (NEURONTIN) 100 MG capsule Take 2 capsules (200 mg total) by mouth at bedtime. 200 mg qam and 300 mg upon return from dialysis on M-W-F for neuropathy 07/31/16  Yes Medina-Vargas, Monina C, NP  oxyCODONE-acetaminophen (PERCOCET/ROXICET) 5-325 MG tablet Take 1 tablet by mouth every 8 (eight) hours as needed for severe pain.    Yes [provider]  Pramoxine-Menthol-Dimethicone (GOLD BOND MEDICATED ANTI ITCH EX) Apply 1 application topically daily as needed. Apply to skin twice a day for itching.    Yes [provider]  sennosides-docusate sodium (SENOKOT-S) 8.6-50 MG tablet Take 1 tablet by mouth daily as needed.    Yes [provider]  simvastatin (ZOCOR) 20 MG tablet Take 1 tablet (20 mg total) by mouth daily. Patient taking differently: Take 20 mg by mouth at bedtime.  07/31/16  Yes  Medina-Vargas, Monina C, NP  sodium chloride (OCEAN) 0.65 % SOLN nasal spray Place 1 spray into both nostrils as needed for congestion. 01/31/14  Yes Ghimire, Henreitta Leber, MD  warfarin (COUMADIN) 5 MG tablet TAKE 1/2 TO 1 (ONE-HALF TO ONE) TABLET BY MOUTH ONCE DAILY AS  DIRECTED  BY  COUMADIN  CLINIC Patient taking differently: Take 2.5 mg on sun. tues. and friday. Take 5 mg on Mon. Wed. thurs saturday 11/27/16  Yes Hilty, Nadean Corwin, MD  amLODipine (NORVASC) 2.5 MG tablet Take 1 tablet (2.5 mg total) by mouth daily. Patient not taking: Reported on 11/29/2016 07/31/16   Medina-Vargas, Monina C, NP  ferric citrate (AURYXIA) 1 GM 210 MG(Fe) tablet Take 1 tablet (210 mg total) by mouth 3 (three)  times daily with meals. Patient not taking: Reported on 11/29/2016 07/31/16   Medina-Vargas, Monina C, NP  silver sulfADIAZINE (SILVADENE) 1 % cream Apply 1 application topically daily. Apply to left knee wound Patient not taking: Reported on 08/14/2016 07/31/16   Nickola Major, NP    Physical Exam: Vitals:   11/29/16 2054 11/29/16 2100 11/29/16 2130 11/29/16 2215  BP:  133/80 (!) 141/89 (!) 160/93  Pulse: (!) 40 (!) 102 (!) 101 98  Resp: 15 (!) 23 (!) 26 (!) 26  Temp:      TempSrc:      SpO2: 98% 97% 96% 91%  Weight:          Constitutional: Moderately built and nourishment Vitals:   11/29/16 2054 11/29/16 2100 11/29/16 2130 11/29/16 2215  BP:  133/80 (!) 141/89 (!) 160/93  Pulse: (!) 40 (!) 102 (!) 101 98  Resp: 15 (!) 23 (!) 26 (!) 26  Temp:      TempSrc:      SpO2: 98% 97% 96% 91%  Weight:       Eyes: Anicteric no pallor. ENMT: No discharge from the ears eyes nose or mouth. Neck: No mass felt.  No neck rigidity. Respiratory: No rhonchi or crepitations. Cardiovascular: S1-S2 heard no murmurs appreciated. Abdomen: Soft nontender bowel sounds present. Musculoskeletal: No edema. Skin: No rash. Neurologic: Alert awake oriented to time place and person.  Moves all  extremities. Psychiatric: Appears normal.  Normal affect.   Labs on Admission: I have personally reviewed following labs and imaging studies  CBC:  Recent Labs Lab 11/29/16 1929  WBC 7.5  HGB 11.9*  HCT 37.8*  MCV 96.2  PLT 010   Basic Metabolic Panel:  Recent Labs Lab 11/29/16 1921  NA 132*  K 4.0  CL 94*  CO2 23  GLUCOSE 106*  BUN 28*  CREATININE 7.26*  CALCIUM 9.0   GFR: Estimated Creatinine Clearance: 12.7 mL/min (A) (by C-G formula based on SCr of 7.26 mg/dL (H)). Liver Function Tests:  Recent Labs Lab 11/29/16 1921  AST 20  ALT 13*  ALKPHOS 109  BILITOT 0.7  PROT 8.0  ALBUMIN 3.4*    Recent Labs Lab 11/29/16 1921  LIPASE 50   No results for input(s): AMMONIA in the last 168 hours. Coagulation Profile:  Recent Labs Lab 11/28/16 11/29/16 1929  INR 3.1* 3.02   Cardiac Enzymes: No results for input(s): CKTOTAL, CKMB, CKMBINDEX, TROPONINI in the last 168 hours. BNP (last 3 results) No results for input(s): PROBNP in the last 8760 hours. HbA1C: No results for input(s): HGBA1C in the last 72 hours. CBG: No results for input(s): GLUCAP in the last 168 hours. Lipid Profile: No results for input(s): CHOL, HDL, LDLCALC, TRIG, CHOLHDL, LDLDIRECT in the last 72 hours. Thyroid Function Tests: No results for input(s): TSH, T4TOTAL, FREET4, T3FREE, THYROIDAB in the last 72 hours. Anemia Panel: No results for input(s): VITAMINB12, FOLATE, FERRITIN, TIBC, IRON, RETICCTPCT in the last 72 hours. Urine analysis:    Component Value Date/Time   COLORURINE YELLOW 01/17/2016 1047   APPEARANCEUR CLOUDY (A) 01/17/2016 1047   LABSPEC 1.016 01/17/2016 1047   PHURINE 5.0 01/17/2016 1047   GLUCOSEU NEGATIVE 01/17/2016 1047   GLUCOSEU NEGATIVE 12/30/2012 1103   HGBUR LARGE (A) 01/17/2016 1047   BILIRUBINUR SMALL (A) 01/17/2016 1047   KETONESUR NEGATIVE 01/17/2016 1047   PROTEINUR 100 (A) 01/17/2016 1047   UROBILINOGEN 1.0 08/25/2014 0641   NITRITE NEGATIVE  01/17/2016 1047   LEUKOCYTESUR LARGE (A) 01/17/2016  1047   Sepsis Labs: @LABRCNTIP (procalcitonin:4,lacticidven:4) )No results found for this or any previous visit (from the past 240 hour(s)).   Radiological Exams on Admission: Ct Head Wo Contrast  Result Date: 11/29/2016 CLINICAL DATA:  Frontal headache since yesterday morning. EXAM: CT HEAD WITHOUT CONTRAST TECHNIQUE: Contiguous axial images were obtained from the base of the skull through the vertex without intravenous contrast. COMPARISON:  08/23/2014 FINDINGS: Brain: Acute right 6 mm thick subdural hematoma along the convexity of the right cerebral hemisphere and along the falx and tentorium appear. No midline shift. Cerebral atrophy with chronic small vessel ischemia. No intra-axial mass nor large vascular territory infarct. No hydrocephalus. Vascular: No hyperdense vessels. Moderate atherosclerosis of the cavernous internal carotids. Skull: Negative for fracture or focal lesions. Sinuses/Orbits: Mild ethmoid sinus mucosal thickening. Intact orbits and globes. Other: None IMPRESSION: 1. Acute right-sided subdural hematoma measuring up to 6 mm in thickness along the convexity of the right cerebrum. Critical Value/emergent results were called by telephone at the time of interpretation on 11/29/2016 at 8:25 pm to [facility] , who verbally acknowledged these results. 2. Chronic small vessel ischemic disease. Electronically Signed   By: Ashley Royalty M.D.   On: 11/29/2016 20:25      Assessment/Plan Principal Problem:   Subdural hematoma (HCC) Active Problems:   ESRD (end stage renal disease) on dialysis [facility])   Atrial fibrillation- failed Amiodarone   OSA (obstructive sleep apnea)- on C-pap    1. Subdural hematoma in the setting of using of Coumadin -neurosurgeon on-call has been consulted.  At this time plan is to hold Coumadin.  Discussed with on-call neurologist Dr. Cheral Marker who at this time advised to reverse Coumadin with Kcentra.   Discussed with pharmacy to dose K Centra.  Recheck CT head in the morning.  Neuro checks. 2. Atrial fibrillation rate controlled on Coreg.  Chads 2 vasc score is more than 2.  Holding Coumadin due to subdural hematoma. 3. ESRD on hemodialysis on Monday Wednesday Friday -please consult nephrology for dialysis in the morning. 4. Anemia secondary to ESRD -follow CBC. 5. Hypertension on amlodipine and Coreg. 6. History of diet-controlled diabetes. 7. CHF -volume managed during dialysis.   DVT prophylaxis: SCDs.  Patient has subdural hematoma. Code Status: Full code. Family Communication: Discussed with patient. Disposition Plan: Home. Consults called: Neurosurgery. Admission status: Inpatient.   Rise Patience MD [facility] Pager [phone].  If 7PM-7AM, please contact night-coverage [email or web address] Password [email or web address]  11/29/2016, 10:44 PM

## 2016-11-29 NOTE — ED Notes (Signed)
Patient placed in a gown and monitor.  Pupils equal and reactive.  Patient is A&Ox4.  Family at bedside.

## 2016-11-29 NOTE — ED Provider Notes (Signed)
[facility] EMERGENCY DEPARTMENT Provider Note   CSN: [id number] Arrival date & time: 11/29/16  1723     History   Chief Complaint Chief Complaint  Patient presents with  . Headache    HPI Daniel Whitney is a 61 y.o. male with extensive pmh including ESRD MWF, atrial fibrillation on warfarin, CHF, non insulin dependent DM c/b neuropathy, MI, CVA presents to ED for evaluation of frontal, persistent, non radiating, gradually worsening, "severe" headache x 2 days. States it is a "10+". No previous h/o of headaches. Gradually worsening. Started after dialysis yesterday, thinks the "bells and beeps" of dialysis caused it because they were louder than usual yesterday. Associated with vomiting x 2.  Not alleviated by tylenol, rest, ice.   No associated changes in vision, no new focal numbness or weakness, fevers, neck stiffness, dizziness, speech or gait difficulty.  No recent illness, nasal congestion, falls. On warfarin, told INR was good yesterday before dialysis. No h/o SAH or aneurysm. Ambulates with roller walker at baseline.  HPI  Past Medical History:  Diagnosis Date  . Allergic rhinitis 07/07/2014  . Anemia due to other cause 07/07/2014  . Arthritis    "all over"  . Atrial fibrillation ([place]) 12/30/2012  . CHF (congestive heart failure) ([place]) 12/30/2012  . Chronic lower back pain   . Diabetes mellitus with neuropathy ([place])   . Diabetic retinopathy ([place]) 12/30/2012   no medications  . Dysrhythmia    Afib  . ESRD (end stage renal disease) on dialysis ([place]) started in 2013   MWF; [facility]; [employer]  . GERD (gastroesophageal reflux disease)   . Glaucoma   . History of gout    "right big toe"  . Hyperlipidemia   . Hypertension   . Hypothyroid    01/17/16- no longer on meduication  . Morbid obesity ([place]) 12/30/2012  . Myocardial infarction ([place])    "I've had ~ 3; last one was in ~ 10/2013" (01/30/2014)  . OSA (obstructive sleep apnea) 12/30/2012   "lost weight; no longer needed CPAP; retested said I needed it; didn't followup cause I was feeling fine" (01/30/2014)  . Pulmonary hypertension ([place]) 12/30/2012  . Refusal of blood transfusions as patient is Jehovah's Witness   . Stroke [facility]) ~ 2005; ~ 2005   "they were mild; I didn't even notice I'd had them"; denies residual on 01/30/2014    Patient Active Problem List   Diagnosis Date Noted  . Subdural hematoma ([place]) 11/29/2016  . Peripheral neuropathy 07/26/2016  . At risk for adverse drug reaction 07/03/2016  . Primary osteoarthritis of left knee 06/26/2016  . Postoperative anemia due to acute blood loss 01/27/2016  . S/P knee replacement 01/24/2016  . Low back pain 02/09/2015  . Bilateral knee pain 01/27/2015  . Atypical atrial flutter ([place]) 01/24/2015  . Acute on chronic combined systolic and diastolic heart failure ([place]) 01/05/2015  . Hyperkalemia 11/08/2014  . Hypocalcemia 11/08/2014  . Recurrent falls 10/13/2014  . Venous stasis ulcer ([place]) 10/13/2014  . Hypoxia   . Leukocytosis   . Respiratory failure ([place])   . ARDS (adult respiratory distress syndrome) ([place])   . Encounter for central line placement   . Cardiopulmonary arrest ([place])   . Compression fracture of L3 lumbar vertebra ([place]) 08/04/2014  . Anemia of chronic renal failure, unspecified stage 07/07/2014  . Allergic rhinitis 07/07/2014  . Right rotator cuff tear 04/27/2014  . Acute gouty arthritis 11/11/2013  . Hearing loss in right ear 11/11/2013  .  Chronic pain syndrome 11/11/2013  . Right shoulder pain 11/11/2013  . Facial rash 11/01/2013  . Right otitis externa 10/27/2013  . Pulmonary HTN- PA 58 mmHg 04/16/13 09/17/2013  . Right hip pain 08/04/2013  . DOE (dyspnea on exertion) 06/18/2013  . Allergic rhinitis, cause unspecified 06/18/2013  . Carpal tunnel syndrome 06/06/2013  . Primary localized osteoarthrosis, lower leg 06/04/2013  . Preoperative cardiovascular examination 03/10/2013  . Preventative  health care 12/30/2012  . Atrial fibrillation- failed Amiodarone 12/30/2012  . CHF (congestive heart failure) ([place]) 12/30/2012  . Diabetic retinopathy ([place]) 12/30/2012  . Morbid obesity ([place]) 12/30/2012  . OSA (obstructive sleep apnea)- on C-pap 12/30/2012  . Bilateral hearing loss 12/30/2012  . Long term (current) use of anticoagulants 12/30/2012  . Arthritis   . Glaucoma   . Diabetes mellitus with neuropathy ([place])   . Hypertension   . ESRD (end stage renal disease) on dialysis ([place])   . Stroke ([place])   . Hyperlipidemia     Past Surgical History:  Procedure Laterality Date  . AV FISTULA PLACEMENT Left ~ 10/2013   "forearm"  . CARDIAC CATHETERIZATION N/A 01/07/2015   Procedure: Left Heart Cath and Coronary Angiography;  Surgeon: Troy Sine, MD;  Location: [facility];  Service: Cardiovascular;  Laterality: N/A;  . CLOSED REDUCTION HIP DISLOCATION Right 1970's  . JOINT REPLACEMENT    . TOTAL HIP ARTHROPLASTY Right 1980  . TOTAL KNEE ARTHROPLASTY Right 01/24/2016   Procedure: TOTAL KNEE ARTHROPLASTY;  Surgeon: Dorna Leitz, MD;  Location: [facility];  Service: Orthopedics;  Laterality: Right;  . TOTAL KNEE ARTHROPLASTY Left 06/26/2016   Procedure: TOTAL KNEE ARTHROPLASTY;  Surgeon: Dorna Leitz, MD;  Location: [facility];  Service: Orthopedics;  Laterality: Left;       Home Medications    Prior to Admission medications   Medication Sig Start Date End Date Taking? Authorizing Provider  acetaminophen (TYLENOL) 650 MG CR tablet Take 650 mg by mouth every 8 (eight) hours as needed for pain.   Yes [provider]  allopurinol (ZYLOPRIM) 100 MG tablet Take 1 tablet (100 mg total) by mouth 2 (two) times daily. 07/31/16  Yes Medina-Vargas, Monina C, NP  amitriptyline (ELAVIL) 50 MG tablet Take 1 tablet (50 mg total) by mouth at bedtime. 07/31/16  Yes Medina-Vargas, Monina C, NP  b complex-vitamin c-folic acid (NEPHRO-VITE) 0.8 MG TABS tablet Take 1 tablet by mouth daily.   Yes  [provider]  carvedilol (COREG) 25 MG tablet Take 1 tablet (25 mg total) by mouth See admin instructions. Give 1 tablet BID on M-W-Sat, give 1 tablet qd on Tue-Thu-Sun Patient taking differently: Take 25 mg by mouth See admin instructions. Give 12.5 tablet BID on M-W-fri, give 1 tablet qd on Tue-Thu-Sun-Sat 07/31/16  Yes Medina-Vargas, Monina C, NP  Dextromethorphan-Guaifenesin (MUCINEX DM) 30-600 MG TB12 Take 1 tablet by mouth daily as needed.    Yes [provider]  docusate sodium (COLACE) 100 MG capsule Take 100 mg by mouth daily as needed.    Yes [provider]  gabapentin (NEURONTIN) 100 MG capsule Take 2 capsules (200 mg total) by mouth at bedtime. 200 mg qam and 300 mg upon return from dialysis on M-W-F for neuropathy 07/31/16  Yes Medina-Vargas, Monina C, NP  oxyCODONE-acetaminophen (PERCOCET/ROXICET) 5-325 MG tablet Take 1 tablet by mouth every 8 (eight) hours as needed for severe pain.    Yes [provider]  Pramoxine-Menthol-Dimethicone (GOLD BOND MEDICATED ANTI ITCH EX) Apply 1 application  topically daily as needed. Apply to skin twice a day for itching.    Yes [provider]  sennosides-docusate sodium (SENOKOT-S) 8.6-50 MG tablet Take 1 tablet by mouth daily as needed.    Yes [provider]  simvastatin (ZOCOR) 20 MG tablet Take 1 tablet (20 mg total) by mouth daily. Patient taking differently: Take 20 mg by mouth at bedtime.  07/31/16  Yes Medina-Vargas, Monina C, NP  sodium chloride (OCEAN) 0.65 % SOLN nasal spray Place 1 spray into both nostrils as needed for congestion. 01/31/14  Yes Ghimire, Henreitta Leber, MD  warfarin (COUMADIN) 5 MG tablet TAKE 1/2 TO 1 (ONE-HALF TO ONE) TABLET BY MOUTH ONCE DAILY AS  DIRECTED  BY  COUMADIN  CLINIC Patient taking differently: Take 2.5 mg on sun. tues. and friday. Take 5 mg on Mon. Wed. thurs saturday 11/27/16  Yes Hilty, Nadean Corwin, MD  amLODipine (NORVASC) 2.5 MG tablet Take 1 tablet (2.5 mg  total) by mouth daily. Patient not taking: Reported on 11/29/2016 07/31/16   Medina-Vargas, Monina C, NP  ferric citrate (AURYXIA) 1 GM 210 MG(Fe) tablet Take 1 tablet (210 mg total) by mouth 3 (three) times daily with meals. Patient not taking: Reported on 11/29/2016 07/31/16   Medina-Vargas, Monina C, NP  silver sulfADIAZINE (SILVADENE) 1 % cream Apply 1 application topically daily. Apply to left knee wound Patient not taking: Reported on 08/14/2016 07/31/16   Medina-Vargas, Senaida Lange, NP    Family History Family History  Problem Relation Age of Onset  . Arthritis Other   . Heart disease Other   . Hyperlipidemia Other   . Hypertension Other   . Kidney disease Other   . Diabetes Other   . Stroke Other   . Diabetes Mother   . Cancer Father        bone cancer  . Heart disease Father   . Diabetes Sister   . Diabetes Brother   . Stroke Brother     Social History Social History  Substance Use Topics  . Smoking status: Never Smoker  . Smokeless tobacco: Never Used  . Alcohol use 4.2 - 4.8 oz/week    1 Cans of beer, 3 Shots of liquor, 3 - 4 Standard drinks or equivalent per week     Comment: 07/03/16 1 pint 2-3X/week     Allergies   Tobacco [nicotiana tabacum]   Review of Systems Review of Systems  Constitutional: Negative for chills and fever.  HENT: Negative for sinus pain.   Eyes: Negative for photophobia and visual disturbance.  Respiratory: Negative for cough and shortness of breath.   Cardiovascular: Negative for chest pain.  Gastrointestinal: Positive for nausea and vomiting. Negative for abdominal pain and diarrhea.  Musculoskeletal: Negative for neck pain and neck stiffness.  Allergic/Immunologic: Positive for immunocompromised state.  Neurological: Positive for headaches. Negative for syncope, speech difficulty, weakness, light-headedness and numbness.  Hematological: Bruises/bleeds easily.     Physical Exam Updated Vital Signs BP (!) 160/93   Pulse 98   Temp  98.3 F (36.8 C) (Oral)   Resp (!) 26   Wt 109.8 kg (242 lb)   SpO2 91%   BMI 36.80 kg/m   Physical Exam  Constitutional: He is oriented to person, place, and time. He appears well-developed and well-nourished. No distress.  HENT:  Head: Normocephalic and atraumatic.  Nose: Nose normal.  Mouth/Throat: Oropharynx is clear and moist. No oropharyngeal exudate.  No sinus or temporal artery tenderness  Eyes: Conjunctivae are normal.  Neck: No tracheal deviation present.  Neck is supple. FPROM of neck without pain or rigidity. No cervical adenopathy.  Cardiovascular: Normal rate, regular rhythm, normal heart sounds and intact distal pulses.   No murmur heard. LUE fistula with palpable and audible thrill 2+ DP and radial pulses bilaterally  Pulmonary/Chest: Effort normal and breath sounds normal. No respiratory distress. He has no wheezes. He has no rales.  Abdominal: Soft. Bowel sounds are normal. He exhibits no distension. There is no tenderness.  Musculoskeletal: Normal range of motion. He exhibits no deformity.  Neurological: He is alert and oriented to person, place, and time.  Speech and phonation normal.  Strength 5/5 with hand grip and ankle flexion/extension.   Sensation to light touch intact in hands and feet. No truncal sway.    Unable to assess Romberg. No arm drift.  Intact finger to nose test. CN I not tested CN II full visual fields bilaterally CN III, IV, VI PEERL and EOMs intact bilaterally CN V light touch intact in all 3 divisions of trigeminal nerve CN VII facial nerve movements intact, symmetric, bilaterally CN VIII hearing intact to finger rub, bilaterally CN IX, X no uvula deviation, symmetric soft palate rise CN XI 5/5 SCM and trapezius strength bilaterally  CN XII Tongue midline with symmetric L/R movement  Skin: Skin is warm and dry. Capillary refill takes less than 2 seconds.  Psychiatric: He has a normal mood and affect. His behavior is normal. Judgment  and thought content normal.  Nursing note and vitals reviewed.    ED Treatments / Results  Labs (all labs ordered are listed, but only abnormal results are displayed) Labs Reviewed  COMPREHENSIVE METABOLIC PANEL - Abnormal; Notable for the following:       Result Value   Sodium 132 (*)    Chloride 94 (*)    Glucose, Bld 106 (*)    BUN 28 (*)    Creatinine, Ser 7.26 (*)    Albumin 3.4 (*)    ALT 13 (*)    GFR calc non Af Amer 7 (*)    GFR calc Af Amer 8 (*)    All other components within normal limits  PROTIME-INR - Abnormal; Notable for the following:    Prothrombin Time 31.0 (*)    All other components within normal limits  CBC - Abnormal; Notable for the following:    RBC 3.93 (*)    Hemoglobin 11.9 (*)    HCT 37.8 (*)    RDW 16.2 (*)    All other components within normal limits  LIPASE, BLOOD    EKG  EKG Interpretation None       Radiology Ct Head Wo Contrast  Result Date: 11/29/2016 CLINICAL DATA:  Frontal headache since yesterday morning. EXAM: CT HEAD WITHOUT CONTRAST TECHNIQUE: Contiguous axial images were obtained from the base of the skull through the vertex without intravenous contrast. COMPARISON:  08/23/2014 FINDINGS: Brain: Acute right 6 mm thick subdural hematoma along the convexity of the right cerebral hemisphere and along the falx and tentorium appear. No midline shift. Cerebral atrophy with chronic small vessel ischemia. No intra-axial mass nor large vascular territory infarct. No hydrocephalus. Vascular: No hyperdense vessels. Moderate atherosclerosis of the cavernous internal carotids. Skull: Negative for fracture or focal lesions. Sinuses/Orbits: Mild ethmoid sinus mucosal thickening. Intact orbits and globes. Other: None IMPRESSION: 1. Acute right-sided subdural hematoma measuring up to 6 mm in thickness along the convexity of the right cerebrum. Critical Value/emergent results were called by telephone  at the time of interpretation on 11/29/2016 at  8:25 pm to [facility] , who verbally acknowledged these results. 2. Chronic small vessel ischemic disease. Electronically Signed   By: Ashley Royalty M.D.   On: 11/29/2016 20:25    Procedures Procedures (including critical care time)  Medications Ordered in ED Medications  prochlorperazine (COMPAZINE) injection 10 mg (10 mg Intravenous Given 11/29/16 1930)  diphenhydrAMINE (BENADRYL) injection 12.5 mg (12.5 mg Intravenous Given 11/29/16 1930)  sodium chloride 0.9 % bolus 250 mL (0 mLs Intravenous Stopped 11/29/16 2128)     Initial Impression / Assessment and Plan / ED Course  I have reviewed the triage vital signs and the nursing notes.  Pertinent labs & imaging results that were available during my care of the patient were reviewed by me and considered in my medical decision making (see chart for details).  61 year old male presents with persistent headache 2 days. On warfarin. Denies head trauma, falls or injury. No previous history of headaches. Gross neurological exam unremarkable. We'll get CT scan and lab work. Migraine cocktail ordered.  2025: patient has a 6 mm SDH with no midline shift. Discussed results with patient and supervising physician. Pending NSGY consult.   2130: re-evaluated pt; reports improvement in headache. VS WNL. Pending NSGY and hospitalist consult. Lab work unchanged from most recent.   Final Clinical Impressions(s) / ED Diagnoses   Spoke to [facility] (Neurosurgery) who review CT scan, recommends admission and hold warfarin. Will be evaluated by NSGY tomorrow AM, no further recommendations. Spoke to Dr. Hal Hope who will admit.  Final diagnoses:  SDH (subdural hematoma) (HCC)  Chronic anticoagulation    New Prescriptions New Prescriptions   No medications on file     Arlean Hopping 11/29/16 2242    Forde Dandy, MD 12/01/16 [phone]

## 2016-11-29 NOTE — ED Notes (Signed)
Pt. Stated to wife (translating) that he believes he may have mumps.

## 2016-11-29 NOTE — ED Notes (Signed)
Pt SpO2 sats dropped into the 70s. Pt found to have snoring respirations. Pt reports he has sleep apnea. Pt placed on 2L [place] and sat up in bed. Will continue to monitor.

## 2016-11-29 NOTE — ED Provider Notes (Addendum)
Medical screening examination/treatment/procedure(s) were conducted as a shared visit with non-physician practitioner(s) and myself.  I personally evaluated the patient during the encounter.   EKG Interpretation None      61 year old male with history of Afib on Coumadin who presents with spontaneous non-traumatic headache for one day. No neurological deficits on exam. Mildly hypertensive. Ct head with SDH. INR 3. Neurosurgery consulted, initially recommending admission and holding coumadin. Admitted to hospitalist service. Likely needing reversal of coumadin. Neurosurgery to be reconsulted to discuss.  CRITICAL CARE Performed by: Forde Dandy   Total critical care time: 31 minutes  Critical care time was exclusive of separately billable procedures and treating other patients.  Critical care was necessary to treat or prevent imminent or life-threatening deterioration.  Critical care was time spent personally by me on the following activities: development of treatment plan with patient and/or surrogate as well as nursing, discussions with consultants, evaluation of patient's response to treatment, examination of patient, obtaining history from patient or surrogate, ordering and performing treatments and interventions, ordering and review of laboratory studies, ordering and review of radiographic studies, pulse oximetry and re-evaluation of patient's condition.     Forde Dandy, MD 11/29/16 [phone]

## 2016-11-30 ENCOUNTER — Inpatient Hospital Stay (HOSPITAL_COMMUNITY): Payer: Medicare Other

## 2016-11-30 DIAGNOSIS — Z7901 Long term (current) use of anticoagulants: Secondary | ICD-10-CM

## 2016-11-30 DIAGNOSIS — G4733 Obstructive sleep apnea (adult) (pediatric): Secondary | ICD-10-CM

## 2016-11-30 LAB — GLUCOSE, CAPILLARY
GLUCOSE-CAPILLARY: 164 mg/dL — AB (ref 65–99)
GLUCOSE-CAPILLARY: 154 mg/dL — AB (ref 65–99)
Glucose-Capillary: 158 mg/dL — ABNORMAL HIGH (ref 65–99)
Glucose-Capillary: 82 mg/dL (ref 65–99)

## 2016-11-30 LAB — CBC
HEMATOCRIT: 33.7 % — AB (ref 39.0–52.0)
HEMOGLOBIN: 10.6 g/dL — AB (ref 13.0–17.0)
MCH: 30.5 pg (ref 26.0–34.0)
MCHC: 31.5 g/dL (ref 30.0–36.0)
MCV: 97.1 fL (ref 78.0–100.0)
Platelets: 198 10*3/uL (ref 150–400)
RBC: 3.47 MIL/uL — AB (ref 4.22–5.81)
RDW: 15.9 % — ABNORMAL HIGH (ref 11.5–15.5)
WBC: 6.8 10*3/uL (ref 4.0–10.5)

## 2016-11-30 LAB — PROTIME-INR
INR: 1.18
INR: 1.17
INR: 1.1
PROTHROMBIN TIME: 14.9 s (ref 11.4–15.2)
PROTHROMBIN TIME: 14.8 s (ref 11.4–15.2)
PROTHROMBIN TIME: 14.1 s (ref 11.4–15.2)

## 2016-11-30 LAB — BASIC METABOLIC PANEL
ANION GAP: 11 (ref 5–15)
BUN: 31 mg/dL — ABNORMAL HIGH (ref 6–20)
CALCIUM: 8.4 mg/dL — AB (ref 8.9–10.3)
CO2: 25 mmol/L (ref 22–32)
Chloride: 95 mmol/L — ABNORMAL LOW (ref 101–111)
Creatinine, Ser: 7.8 mg/dL — ABNORMAL HIGH (ref 0.61–1.24)
GFR, EST AFRICAN AMERICAN: 8 mL/min — AB (ref >60)
GFR, EST NON AFRICAN AMERICAN: 7 mL/min — AB (ref >60)
Glucose, Bld: 199 mg/dL — ABNORMAL HIGH (ref 65–99)
POTASSIUM: 3.9 mmol/L (ref 3.5–5.1)
SODIUM: 131 mmol/L — AB (ref 135–145)

## 2016-11-30 LAB — HIV ANTIBODY (ROUTINE TESTING W REFLEX): HIV SCREEN 4TH GENERATION: NONREACTIVE

## 2016-11-30 MED ORDER — DARBEPOETIN ALFA 100 MCG/0.5ML IJ SOSY
100.0000 ug | PREFILLED_SYRINGE | INTRAMUSCULAR | Status: DC
  Administered 2016-12-01: 100 ug via INTRAVENOUS
  Filled 2016-11-30: qty 0.5

## 2016-11-30 MED ORDER — GABAPENTIN 300 MG PO CAPS
300.0000 mg | ORAL_CAPSULE | ORAL | Status: DC
  Administered 2016-11-30: 300 mg via ORAL
  Filled 2016-11-30 (×2): qty 1

## 2016-11-30 MED ORDER — CARVEDILOL 12.5 MG PO TABS
12.5000 mg | ORAL_TABLET | ORAL | Status: DC
  Administered 2016-11-30 (×2): 12.5 mg via ORAL
  Filled 2016-11-30 (×2): qty 1

## 2016-11-30 NOTE — Progress Notes (Signed)
PROGRESS NOTE    Daniel Whitney  GQQ:[id number] DOB: [birth year] DOA: 11/29/2016 PCP: [name], MD   Chief Complaint  Patient presents with  . Headache    Brief Narrative:  HPI on 11/29/2016 by Dr. Gean Birchwood Daniel Whitney is a 61 y.o. male with history of atrial fibrillation on Coumadin, ESRD on hemodialysis, hypertension, anemia and diet-controlled diabetes who presents to the ER with complaints of persistent headache.  Patient has been having a frontal headache for last 2 days which has been worsening with severe intensity.  Patient also has had at least 4 episodes of nausea vomiting.  Denies any weakness of the upper or lower extremities.  Patient states that he did have a fall 2 weeks ago but at that time he did not hit his head or lose consciousness. Assessment & Plan   Subdural hematoma, right -In the setting of Coumadin, possibly fall a few weeks ago -Neurosurgery consulted and appreciated -Patient's Coumadin was reversed with K [facility] -Pending neurosurgical recommendations -Repeat CT scan: Right subdural hematoma measuring 6 mm in maximal depth, effacement of the sulcal and lateral ventricle. No progression from previous study  Atrial fibrillation, paroxysmal -CHADSVASC 2 -Currently in sinus rhythm -Coumadin held -Continue Coreg  End-stage renal disease -Patient dialyzes Monday, Wednesday, Friday -Nephrology consulted and appreciated  Anemia secondary to chronic disease -Hemoglobin currently stable, 10.6 -Continue to monitor CBC  Essential hypertension -Continue amlodipine, Coreg  Diabetes mellitus, type II -Diet-controlled -Continue to monitor CBGs, will add on sliding scale if needed  Chronic combined CHF -Echocardiogram in 2016 noted an EF of 67%, grade 2 diastolic dysfunction -Appears to be euvolemic and compensated -Continue dialysis for volume control  DVT Prophylaxis  SCDs  Code Status: Full  Family Communication: None at  Bedside  Disposition Plan: Admitted  Consultants Neurosurgery Nephrology  Procedures  None  Antibiotics   Anti-infectives    None      Subjective:   Daniel Whitney seen and examined today.  Patient states he's feeling better. Denies any visual changes or headache. Denies any chest pain, shortness of breath, abdominal pain, nausea vomiting, diarrhea or constipation.  Objective:   Vitals:   11/30/16 0008 11/30/16 0437 11/30/16 1022 11/30/16 1400  BP: 137/69 133/64 (!) 142/74 137/65  Pulse: 72 79 (!) 101 83  Resp: 18 18 17 18   Temp: 98.1 F (36.7 C) 98 F (36.7 C) 98.2 F (36.8 C) 98.2 F (36.8 C)  TempSrc: Oral Oral Oral Oral  SpO2: 100% 100% 100% 100%  Weight: 109.8 kg (242 lb 1.6 oz) 109.2 kg (240 lb 11.9 oz)    Height: 5\' 11"  (1.803 m)       Intake/Output Summary (Last 24 hours) at 11/30/16 1605 Last data filed at 11/30/16 0500  Gross per 24 hour  Intake              540 ml  Output                0 ml  Net              540 ml   Filed Weights   11/29/16 1735 11/30/16 0008 11/30/16 0437  Weight: 109.8 kg (242 lb) 109.8 kg (242 lb 1.6 oz) 109.2 kg (240 lb 11.9 oz)    Exam  General: Well developed, well nourished, NAD, appears stated age  HEENT: NCAT, mucous membranes moist.   Cardiovascular: S1 S2 auscultated, no rubs, murmurs or gallops. Regular rate and rhythm.  Respiratory: Clear to auscultation bilaterally  with equal chest rise  Abdomen: Soft, obese, nontender, nondistended, + bowel sounds  Extremities: warm dry without cyanosis clubbing or edema  Neuro: AAOx3, nonfocal  Psych: Normal affect and demeanor with intact judgement and insight   Data Reviewed: I have personally reviewed following labs and imaging studies  CBC:  Recent Labs Lab 11/29/16 1929 11/30/16 0218  WBC 7.5 6.8  HGB 11.9* 10.6*  HCT 37.8* 33.7*  MCV 96.2 97.1  PLT 217 794   Basic Metabolic Panel:  Recent Labs Lab 11/29/16 1921 11/30/16 0218  NA 132* 131*  K  4.0 3.9  CL 94* 95*  CO2 23 25  GLUCOSE 106* 199*  BUN 28* 31*  CREATININE 7.26* 7.80*  CALCIUM 9.0 8.4*   GFR: Estimated Creatinine Clearance: 12.3 mL/min (A) (by C-G formula based on SCr of 7.8 mg/dL (H)). Liver Function Tests:  Recent Labs Lab 11/29/16 1921  AST 20  ALT 13*  ALKPHOS 109  BILITOT 0.7  PROT 8.0  ALBUMIN 3.4*    Recent Labs Lab 11/29/16 1921  LIPASE 50   No results for input(s): AMMONIA in the last 168 hours. Coagulation Profile:  Recent Labs Lab 11/28/16 11/29/16 1929 11/30/16 0218 11/30/16 0540 11/30/16 1123  INR 3.1* 3.02 1.18 1.17 1.10   Cardiac Enzymes: No results for input(s): CKTOTAL, CKMB, CKMBINDEX, TROPONINI in the last 168 hours. BNP (last 3 results) No results for input(s): PROBNP in the last 8760 hours. HbA1C: No results for input(s): HGBA1C in the last 72 hours. CBG:  Recent Labs Lab 11/30/16 0006 11/30/16 0605 11/30/16 1117  GLUCAP 82 164* 154*   Lipid Profile: No results for input(s): CHOL, HDL, LDLCALC, TRIG, CHOLHDL, LDLDIRECT in the last 72 hours. Thyroid Function Tests: No results for input(s): TSH, T4TOTAL, FREET4, T3FREE, THYROIDAB in the last 72 hours. Anemia Panel: No results for input(s): VITAMINB12, FOLATE, FERRITIN, TIBC, IRON, RETICCTPCT in the last 72 hours. Urine analysis:    Component Value Date/Time   COLORURINE YELLOW 01/17/2016 1047   APPEARANCEUR CLOUDY (A) 01/17/2016 1047   LABSPEC 1.016 01/17/2016 1047   PHURINE 5.0 01/17/2016 1047   GLUCOSEU NEGATIVE 01/17/2016 1047   GLUCOSEU NEGATIVE 12/30/2012 1103   HGBUR LARGE (A) 01/17/2016 1047   BILIRUBINUR SMALL (A) 01/17/2016 1047   KETONESUR NEGATIVE 01/17/2016 1047   PROTEINUR 100 (A) 01/17/2016 1047   UROBILINOGEN 1.0 08/25/2014 0641   NITRITE NEGATIVE 01/17/2016 1047   LEUKOCYTESUR LARGE (A) 01/17/2016 1047   Sepsis Labs: @LABRCNTIP (procalcitonin:4,lacticidven:4)  )No results found for this or any previous visit (from the past 240  hour(s)).    Radiology Studies: Ct Head Wo Contrast  Result Date: 11/30/2016 CLINICAL DATA:  Follow-up intracranial hemorrhage. Patient states he is feeling better. No pain. EXAM: CT HEAD WITHOUT CONTRAST TECHNIQUE: Contiguous axial images were obtained from the base of the skull through the vertex without intravenous contrast. COMPARISON:  11/29/2016 FINDINGS: Brain: Acute right subdural hematoma extending along the right frontoparietal region and along the falx and tentorium. Maximal depth measures about 7 mm. No significant change since previous study. There is no significant midline shift. There is effacement of sulci and lateral right ventricles. No new hemorrhage is identified. Gray-white matter junctions remain distinct. Basal cisterns are not effaced. Vascular: Vascular calcifications are present. Skull: Calvarium appears intact. Sinuses/Orbits: Opacification of ethmoid air cells. Mild mucosal thickening in the maxillary antra. Mastoid air cells are clear. Other: No significant changes since the previous study. IMPRESSION: Right subdural hematoma measuring 6 mm maximal depth. Effacement of sulci  and lateral ventricle. No progression since previous study. Electronically Signed   By: Lucienne Capers M.D.   On: 11/30/2016 05:42   Ct Head Wo Contrast  Result Date: 11/29/2016 CLINICAL DATA:  Frontal headache since yesterday morning. EXAM: CT HEAD WITHOUT CONTRAST TECHNIQUE: Contiguous axial images were obtained from the base of the skull through the vertex without intravenous contrast. COMPARISON:  08/23/2014 FINDINGS: Brain: Acute right 6 mm thick subdural hematoma along the convexity of the right cerebral hemisphere and along the falx and tentorium appear. No midline shift. Cerebral atrophy with chronic small vessel ischemia. No intra-axial mass nor large vascular territory infarct. No hydrocephalus. Vascular: No hyperdense vessels. Moderate atherosclerosis of the cavernous internal carotids.  Skull: Negative for fracture or focal lesions. Sinuses/Orbits: Mild ethmoid sinus mucosal thickening. Intact orbits and globes. Other: None IMPRESSION: 1. Acute right-sided subdural hematoma measuring up to 6 mm in thickness along the convexity of the right cerebrum. Critical Value/emergent results were called by telephone at the time of interpretation on 11/29/2016 at 8:25 pm to [facility] , who verbally acknowledged these results. 2. Chronic small vessel ischemic disease. Electronically Signed   By: Ashley Royalty M.D.   On: 11/29/2016 20:25     Scheduled Meds: . allopurinol  100 mg Oral BID  . amitriptyline  50 mg Oral QHS  . carvedilol  12.5 mg Oral 2 times per day on Mon Wed Fri  . [START ON 12/01/2016] carvedilol  25 mg Oral Q T,Th,S,Su-1800  . [START ON 12/01/2016] darbepoetin (ARANESP) injection - DIALYSIS  100 mcg Intravenous Q Sat-HD  . gabapentin  200 mg Oral Daily  . gabapentin  300 mg Oral Q M,W,F-HD  . multivitamin  1 tablet Oral QHS  . simvastatin  20 mg Oral QHS   Continuous Infusions: . phytonadione (VITAMIN K) IV       LOS: 1 day   Time Spent in minutes   30 minutes  Daniel Whitney D.O. on 11/30/2016 at 4:05 PM  Between 7am to 7pm - Pager - [phone]  After 7pm go to [email or web address] - password [email or web address]  And look for the night coverage person covering for me after hours  [facility] Office  [phone]

## 2016-11-30 NOTE — Care Management Note (Signed)
Case Management Note  Patient Details  Name: Daniel Whitney MRN: [id number] Date of Birth: 06-15-1955  Subjective/Objective:  Pt admitted with subdural hematoma. He is from home with family.                  Action/Plan: Plan is for patient to return home when medically ready. CM following for d/c needs, physician orders.  Expected Discharge Date:  12/04/16               Expected Discharge Plan:  Home/Self Care  In-House Referral:     Discharge planning Services     Post Acute Care Choice:    Choice offered to:     DME Arranged:    DME Agency:     HH Arranged:    HH Agency:     Status of Service:  In process, will continue to follow  If discussed at Long Length of Stay Meetings, dates discussed:    Additional Comments:  Pollie Friar, RN 11/30/2016, 3:00 PM

## 2016-11-30 NOTE — Consult Note (Signed)
Daniel Whitney Renal Consultation Note  Indication for Consultation:  Management of ESRD/hemodialysis; anemia, hypertension/volume and secondary hyperparathyroidism  HPI: Daniel Whitney is a 61 y.o. male with ESRD 2/2 HTN on HD since 04/25/12 transferred to Daniel Whitney from Daniel Whitney 11/2012 a. fib on amiodarone on coumadin ,Jehovah's Witness,Type 2 DM/morbid obesity/hx CVA/OSA   12/2014 PEA arret, cath no active dz acute on chronic CHF ,R Knee=Replacement and  01/2016,06/2016=  L knee,admitted with right sided subdural hematoma. Developed severe worsening HA past 2 days and  4 episodes of N/V . Reports hitting head on Car rental door about 2 weeks ago and no other trauma.Compliant with op HD .   HA better now sp Pain meds given. Seen by Neurosurgery team and noted plan = " rescan head in about a week / no surgical intervention needed at this time."  Will use no Heparin  with hd .    Past Medical History:  Diagnosis Date  . Allergic rhinitis 07/07/2014  . Anemia due to other cause 07/07/2014  . Arthritis    "all over"  . Atrial fibrillation ([place]) 12/30/2012  . CHF (congestive heart failure) ([place]) 12/30/2012  . Chronic lower back pain   . Diabetes mellitus with neuropathy ([place])   . Diabetic retinopathy ([place]) 12/30/2012   no medications  . Dysrhythmia    Afib  . ESRD (end stage renal disease) on dialysis ([place]) started in 2013   MWF; [facility]; [employer]  . GERD (gastroesophageal reflux disease)   . Glaucoma   . History of gout    "right big toe"  . Hyperlipidemia   . Hypertension   . Hypothyroid    01/17/16- no longer on meduication  . Morbid obesity (Daniel Whitney) 12/30/2012  . Myocardial infarction ([place])    "I've had ~ 3; last one was in ~ 10/2013" (01/30/2014)  . OSA (obstructive sleep apnea) 12/30/2012   "lost weight; no longer needed CPAP; retested said I needed it; didn't followup cause I was feeling fine" (01/30/2014)  . Pulmonary hypertension ([place]) 12/30/2012  . Refusal of blood  transfusions as patient is Jehovah's Witness   . Stroke Daniel Whitney) ~ 2005; ~ 2005   "they were mild; I didn't even notice I'd had them"; denies residual on 01/30/2014    Past Surgical History:  Procedure Laterality Date  . AV FISTULA PLACEMENT Left ~ 10/2013   "forearm"  . CARDIAC CATHETERIZATION N/A 01/07/2015   Procedure: Left Heart Cath and Coronary Angiography;  Surgeon: Daniel Whitney;  Location: [facility];  Service: Cardiovascular;  Laterality: N/A;  . CLOSED REDUCTION HIP DISLOCATION Right 1970's  . JOINT REPLACEMENT    . TOTAL HIP ARTHROPLASTY Right 1980  . TOTAL KNEE ARTHROPLASTY Right 01/24/2016   Procedure: TOTAL KNEE ARTHROPLASTY;  Surgeon: Daniel Whitney;  Location: [place] [facility];  Service: Orthopedics;  Laterality: Right;  . TOTAL KNEE ARTHROPLASTY Left 06/26/2016   Procedure: TOTAL KNEE ARTHROPLASTY;  Surgeon: Daniel Whitney;  Location: [facility];  Service: Orthopedics;  Laterality: Left;      Family History  Problem Relation Age of Onset  . Arthritis Other   . Heart disease Other   . Hyperlipidemia Other   . Hypertension Other   . Kidney disease Other   . Diabetes Other   . Stroke Other   . Diabetes Mother   . Cancer Father        bone cancer  . Heart disease Father   . Diabetes Sister   . Diabetes Brother   .  Stroke Brother       reports that he has never smoked. He has never used smokeless tobacco. He reports that he drinks about 4.2 - 4.8 oz of alcohol per week . He reports that he does not use drugs.   Allergies  Allergen Reactions  . Tobacco [Nicotiana Tabacum] Shortness Of Breath    Prior to Admission medications   Medication Sig Start Date End Date Taking? Authorizing Provider  acetaminophen (TYLENOL) 650 MG CR tablet Take 650 mg by mouth every 8 (eight) hours as needed for pain.   Yes Provider, Historical, Whitney  allopurinol (ZYLOPRIM) 100 MG tablet Take 1 tablet (100 mg total) by mouth 2 (two) times daily. 07/31/16  Yes Medina-Vargas, Monina C, NP   amitriptyline (ELAVIL) 50 MG tablet Take 1 tablet (50 mg total) by mouth at bedtime. 07/31/16  Yes Medina-Vargas, Monina C, NP  b complex-vitamin c-folic acid (NEPHRO-VITE) 0.8 MG TABS tablet Take 1 tablet by mouth daily.   Yes Provider, Historical, Whitney  carvedilol (COREG) 25 MG tablet Take 1 tablet (25 mg total) by mouth See admin instructions. Give 1 tablet BID on M-W-Sat, give 1 tablet qd on Tue-Thu-Sun Patient taking differently: Take 25 mg by mouth See admin instructions. Give 12.5 tablet BID on M-W-fri, give 1 tablet qd on Tue-Thu-Sun-Sat 07/31/16  Yes Medina-Vargas, Monina C, NP  Dextromethorphan-Guaifenesin (MUCINEX DM) 30-600 MG TB12 Take 1 tablet by mouth daily as needed.    Yes Provider, Historical, Whitney  docusate sodium (COLACE) 100 MG capsule Take 100 mg by mouth daily as needed.    Yes Provider, Historical, Whitney  gabapentin (NEURONTIN) 100 MG capsule Take 2 capsules (200 mg total) by mouth at bedtime. 200 mg qam and 300 mg upon return from dialysis on M-W-F for neuropathy 07/31/16  Yes Medina-Vargas, Monina C, NP  oxyCODONE-acetaminophen (PERCOCET/ROXICET) 5-325 MG tablet Take 1 tablet by mouth every 8 (eight) hours as needed for severe pain.    Yes Provider, Historical, Whitney  Pramoxine-Menthol-Dimethicone (GOLD BOND MEDICATED ANTI ITCH EX) Apply 1 application topically daily as needed. Apply to skin twice a day for itching.    Yes Provider, Historical, Whitney  sennosides-docusate sodium (SENOKOT-S) 8.6-50 MG tablet Take 1 tablet by mouth daily as needed.    Yes Provider, Historical, Whitney  simvastatin (ZOCOR) 20 MG tablet Take 1 tablet (20 mg total) by mouth daily. Patient taking differently: Take 20 mg by mouth at bedtime.  07/31/16  Yes Medina-Vargas, Monina C, NP  sodium chloride (OCEAN) 0.65 % SOLN nasal spray Place 1 spray into both nostrils as needed for congestion. 01/31/14  Yes Daniel Whitney  warfarin (COUMADIN) 5 MG tablet TAKE 1/2 TO 1 (ONE-HALF TO ONE) TABLET BY MOUTH ONCE DAILY AS   DIRECTED  BY  COUMADIN  CLINIC Patient taking differently: Take 2.5 mg on sun. tues. and friday. Take 5 mg on Mon. Wed. thurs saturday 11/27/16  Yes Hilty, Nadean Corwin, Whitney  amLODipine (NORVASC) 2.5 MG tablet Take 1 tablet (2.5 mg total) by mouth daily. Patient not taking: Reported on 11/29/2016 07/31/16   Medina-Vargas, Monina C, NP  ferric citrate (AURYXIA) 1 GM 210 MG(Fe) tablet Take 1 tablet (210 mg total) by mouth 3 (three) times daily with meals. Patient not taking: Reported on 11/29/2016 07/31/16   Medina-Vargas, Monina C, NP  silver sulfADIAZINE (SILVADENE) 1 % cream Apply 1 application topically daily. Apply to left knee wound Patient not taking: Reported on 08/14/2016 07/31/16   Medina-Vargas, Senaida Lange, NP  Results for orders placed or performed during the hospital encounter of 11/29/16 (from the past 48 hour(s))  Comprehensive metabolic panel     Status: Abnormal   Collection Time: 11/29/16  7:21 PM  Result Value Ref Range   Sodium 132 (L) 135 - 145 mmol/L   Potassium 4.0 3.5 - 5.1 mmol/L   Chloride 94 (L) 101 - 111 mmol/L   CO2 23 22 - 32 mmol/L   Glucose, Bld 106 (H) 65 - 99 mg/dL   BUN 28 (H) 6 - 20 mg/dL   Creatinine, Ser 7.26 (H) 0.61 - 1.24 mg/dL   Calcium 9.0 8.9 - 10.3 mg/dL   Total Protein 8.0 6.5 - 8.1 g/dL   Albumin 3.4 (L) 3.5 - 5.0 g/dL   AST 20 15 - 41 U/L   ALT 13 (L) 17 - 63 U/L   Alkaline Phosphatase 109 38 - 126 U/L   Total Bilirubin 0.7 0.3 - 1.2 mg/dL   GFR calc non Af Amer 7 (L) >60 mL/min   GFR calc Af Amer 8 (L) >60 mL/min    Comment: (NOTE) The eGFR has been calculated using the CKD EPI equation. This calculation has not been validated in all clinical situations. eGFR's persistently <60 mL/min signify possible Chronic Kidney Disease.    Anion gap 15 5 - 15  Lipase, blood     Status: None   Collection Time: 11/29/16  7:21 PM  Result Value Ref Range   Lipase 50 11 - 51 U/L  Protime-INR     Status: Abnormal   Collection Time: 11/29/16  7:29 PM   Result Value Ref Range   Prothrombin Time 31.0 (H) 11.4 - 15.2 seconds   INR 3.02   CBC     Status: Abnormal   Collection Time: 11/29/16  7:29 PM  Result Value Ref Range   WBC 7.5 4.0 - 10.5 K/uL   RBC 3.93 (L) 4.22 - 5.81 MIL/uL   Hemoglobin 11.9 (L) 13.0 - 17.0 g/dL   HCT 37.8 (L) 39.0 - 52.0 %   MCV 96.2 78.0 - 100.0 fL   MCH 30.3 26.0 - 34.0 pg   MCHC 31.5 30.0 - 36.0 g/dL   RDW 16.2 (H) 11.5 - 15.5 %   Platelets 217 150 - 400 K/uL  Glucose, capillary     Status: None   Collection Time: 11/30/16 12:06 AM  Result Value Ref Range   Glucose-Capillary 82 65 - 99 mg/dL   Comment 1 Notify RN    Comment 2 Document in Chart   Basic metabolic panel     Status: Abnormal   Collection Time: 11/30/16  2:18 AM  Result Value Ref Range   Sodium 131 (L) 135 - 145 mmol/L   Potassium 3.9 3.5 - 5.1 mmol/L   Chloride 95 (L) 101 - 111 mmol/L   CO2 25 22 - 32 mmol/L   Glucose, Bld 199 (H) 65 - 99 mg/dL   BUN 31 (H) 6 - 20 mg/dL   Creatinine, Ser 7.80 (H) 0.61 - 1.24 mg/dL   Calcium 8.4 (L) 8.9 - 10.3 mg/dL   GFR calc non Af Amer 7 (L) >60 mL/min   GFR calc Af Amer 8 (L) >60 mL/min    Comment: (NOTE) The eGFR has been calculated using the CKD EPI equation. This calculation has not been validated in all clinical situations. eGFR's persistently <60 mL/min signify possible Chronic Kidney Disease.    Anion gap 11 5 - 15  CBC  Status: Abnormal   Collection Time: 11/30/16  2:18 AM  Result Value Ref Range   WBC 6.8 4.0 - 10.5 K/uL   RBC 3.47 (L) 4.22 - 5.81 MIL/uL   Hemoglobin 10.6 (L) 13.0 - 17.0 g/dL   HCT 33.7 (L) 39.0 - 52.0 %   MCV 97.1 78.0 - 100.0 fL   MCH 30.5 26.0 - 34.0 pg   MCHC 31.5 30.0 - 36.0 g/dL   RDW 15.9 (H) 11.5 - 15.5 %   Platelets 198 150 - 400 K/uL  Protime-INR     Status: None   Collection Time: 11/30/16  2:18 AM  Result Value Ref Range   Prothrombin Time 14.9 11.4 - 15.2 seconds   INR 1.18   Protime-INR     Status: None   Collection Time: 11/30/16  5:40 AM   Result Value Ref Range   Prothrombin Time 14.8 11.4 - 15.2 seconds   INR 1.17   Glucose, capillary     Status: Abnormal   Collection Time: 11/30/16  6:05 AM  Result Value Ref Range   Glucose-Capillary 164 (H) 65 - 99 mg/dL   Comment 1 Notify RN    Comment 2 Document in Chart     ROS: AS IN hpi   Physical Exam: Vitals:   11/30/16 0008 11/30/16 0437  BP: 137/69 133/64  Pulse: 72 79  Resp: 18 18  Temp: 98.1 F (36.7 C) 98 F (36.7 C)  SpO2: 100% 100%     General: Alert Obese AAM  NAD, Appropriate  HEENT: [place] , Anicteric , EOMI Neck: no JVD Heart: RRR  No Mur , Rub , Gallop appreciated  Lungs: CTA  unlabored breathng  Abdomen: Obese, Soft ,NT, ND Extremities: no pedal edema Skin: warm dry, noovert rashj Neuro: Alert OX4, moves all exttremities Dialysis Access: Pos bruit LFA AVF   Dialysis Orders: [facility]: [facility]  on MWF . EDW 113 kg HD Bath 2k, 2 ca  Time 4hr Heparin 4400 . Access LFA AVF       Mircera 100 mcg  q 2 wks (last given 11/14/16)   Units IV/HD  No Heparin  Assessment/Plan 1. Subdural Hematoma- NS following , No hep. HD and Coumadin Stopped (for A Fib) 2. ESRD -  HD MWF  (vol and K 3.9  )Ok to wait for HD in [facility] Emergent HD pt needs in HD today 3. Hypertension/volume  - On Amlodipine 2.100m and Coreg 256mbid  / vol in am 2 l uf  4. Anemia  - hgb 10.6 ESA  Next TX  5. Metabolic bone disease -  No vit d /  2 Renvela as binder  6. DM type 2 - diet controlled    DaErnest HaberPA-C [phone]/19/2018, 9:43 AM   Pt seen, examined and agree w A/P as above.  RoKelly SplinterD CaNewell Rubbermaidager [phone] 11/30/2016, 2:34 PM

## 2016-11-30 NOTE — Progress Notes (Signed)
Admitted [phone] via ED stretcher.  A&O x 4, headache level 3, O2 @ 3L [place] due to sleep apnea.  Does not use cpap at home. Vit K infusing per order, upon completion will convert to NSL right hand.  Oriented to unit, safety precautions reviewed.

## 2016-11-30 NOTE — Consult Note (Signed)
Reason for Consult:SDH Referring Physician: EDP  Daniel Whitney is an 61 y.o. male.   HPI:  61 year old male comes in last night with complaints of a headache that started yesterday. States that he fell about 3-4 weeks ago that he remembers. He also remembers hitting his head on his car when getting out. He is on coumadin for history of a.fib. Extensive medical history including dialysis MWF. He denies any loc or vision changes. He has had some N/V twice in the last two days. He took tylenol every 4 hours yesterday for his pain and got no relief.   Past Medical History:  Diagnosis Date  . Allergic rhinitis 07/07/2014  . Anemia due to other cause 07/07/2014  . Arthritis    "all over"  . Atrial fibrillation ([place]) 12/30/2012  . CHF (congestive heart failure) ([place]) 12/30/2012  . Chronic lower back pain   . Diabetes mellitus with neuropathy ([place])   . Diabetic retinopathy ([place]) 12/30/2012   no medications  . Dysrhythmia    Afib  . ESRD (end stage renal disease) on dialysis ([place]) started in 2013   MWF; [facility]; [employer]  . GERD (gastroesophageal reflux disease)   . Glaucoma   . History of gout    "right big toe"  . Hyperlipidemia   . Hypertension   . Hypothyroid    01/17/16- no longer on meduication  . Morbid obesity ([place]) 12/30/2012  . Myocardial infarction ([place])    "I've had ~ 3; last one was in ~ 10/2013" (01/30/2014)  . OSA (obstructive sleep apnea) 12/30/2012   "lost weight; no longer needed CPAP; retested said I needed it; didn't followup cause I was feeling fine" (01/30/2014)  . Pulmonary hypertension ([place]) 12/30/2012  . Refusal of blood transfusions as patient is Jehovah's Witness   . Stroke [facility]) ~ 2005; ~ 2005   "they were mild; I didn't even notice I'd had them"; denies residual on 01/30/2014    Past Surgical History:  Procedure Laterality Date  . AV FISTULA PLACEMENT Left ~ 10/2013   "forearm"  . CARDIAC CATHETERIZATION N/A 01/07/2015   Procedure: Left Heart Cath  and Coronary Angiography;  Surgeon: Troy Sine, MD;  Location: [facility];  Service: Cardiovascular;  Laterality: N/A;  . CLOSED REDUCTION HIP DISLOCATION Right 1970's  . JOINT REPLACEMENT    . TOTAL HIP ARTHROPLASTY Right 1980  . TOTAL KNEE ARTHROPLASTY Right 01/24/2016   Procedure: TOTAL KNEE ARTHROPLASTY;  Surgeon: Dorna Leitz, MD;  Location: [facility];  Service: Orthopedics;  Laterality: Right;  . TOTAL KNEE ARTHROPLASTY Left 06/26/2016   Procedure: TOTAL KNEE ARTHROPLASTY;  Surgeon: Dorna Leitz, MD;  Location: [facility];  Service: Orthopedics;  Laterality: Left;    Allergies  Allergen Reactions  . Tobacco [Nicotiana Tabacum] Shortness Of Breath    Social History  Substance Use Topics  . Smoking status: Never Smoker  . Smokeless tobacco: Never Used  . Alcohol use 4.2 - 4.8 oz/week    1 Cans of beer, 3 Shots of liquor, 3 - 4 Standard drinks or equivalent per week     Comment: 07/03/16 1 pint 2-3X/week    Family History  Problem Relation Age of Onset  . Arthritis Other   . Heart disease Other   . Hyperlipidemia Other   . Hypertension Other   . Kidney disease Other   . Diabetes Other   . Stroke Other   . Diabetes Mother   . Cancer Father  bone cancer  . Heart disease Father   . Diabetes Sister   . Diabetes Brother   . Stroke Brother      Review of Systems  Positive ROS: Headache  All other systems have been reviewed and were otherwise negative with the exception of those mentioned in the HPI and as above.  Objective: Vital signs in last 24 hours: Temp:  [98 F (36.7 C)-98.3 F (36.8 C)] 98 F (36.7 C) (10/19 0437) Pulse Rate:  [40-108] 79 (10/19 0437) Resp:  [15-26] 18 (10/19 0437) BP: (133-160)/(64-93) 133/64 (10/19 0437) SpO2:  [91 %-100 %] 100 % (10/19 0437) Weight:  [109.2 kg (240 lb 11.9 oz)-109.8 kg (242 lb 1.6 oz)] 109.2 kg (240 lb 11.9 oz) (10/19 0437)  General Appearance: Alert, cooperative, no distress, appears stated age Head:  Normocephalic, without obvious abnormality, atraumatic Eyes: PERRL, conjunctiva/corneas clear, EOM's intact, fundi benign, both eyes      Neck: Supple, symmetrical, trachea midline Back: not tested Lungs:respirations unlabored Heart: Regular rate and rhythm Extremities: Extremities normal, atraumatic, no cyanosis or edema Pulses: 2+ and symmetric all extremities Skin: Skin color, texture, turgor normal, no rashes or lesions  NEUROLOGIC:   Mental status: A&O x4, no aphasia, good attention span, Memory and fund of knowledge Motor Exam - grossly normal, normal tone and bulk Sensory Exam - grossly normal Reflexes: symmetric, no pathologic reflexes, No Hoffman's, No clonus Coordination - grossly normal Gait - not tested Balance - not tested Cranial Nerves: I: smell Not tested  II: visual acuity  OS: na   OD: na  II: visual fields Full to confrontation  II: pupils Equal, round, reactive to light  III,VII: ptosis None  III,IV,VI: extraocular muscles  Full ROM  V: mastication   V: facial light touch sensation    V,VII: corneal reflex    VII: facial muscle function - upper    VII: facial muscle function - lower   VIII: hearing Not tested  IX: soft palate elevation    IX,X: gag reflex   XI: trapezius strength    XI: sternocleidomastoid strength   XI: neck flexion strength    XII: tongue strength      Data Review Lab Results  Component Value Date   WBC 6.8 11/30/2016   HGB 10.6 (L) 11/30/2016   HCT 33.7 (L) 11/30/2016   MCV 97.1 11/30/2016   PLT 198 11/30/2016   Lab Results  Component Value Date   NA 131 (L) 11/30/2016   K 3.9 11/30/2016   CL 95 (L) 11/30/2016   CO2 25 11/30/2016   BUN 31 (H) 11/30/2016   CREATININE 7.80 (H) 11/30/2016   GLUCOSE 199 (H) 11/30/2016   Lab Results  Component Value Date   INR 1.17 11/30/2016   PROTIME 27.0 (A) 07/26/2016    Radiology: Ct Head Wo Contrast  Result Date: 11/30/2016 CLINICAL DATA:  Follow-up intracranial hemorrhage.  Patient states he is feeling better. No pain. EXAM: CT HEAD WITHOUT CONTRAST TECHNIQUE: Contiguous axial images were obtained from the base of the skull through the vertex without intravenous contrast. COMPARISON:  11/29/2016 FINDINGS: Brain: Acute right subdural hematoma extending along the right frontoparietal region and along the falx and tentorium. Maximal depth measures about 7 mm. No significant change since previous study. There is no significant midline shift. There is effacement of sulci and lateral right ventricles. No new hemorrhage is identified. Gray-white matter junctions remain distinct. Basal cisterns are not effaced. Vascular: Vascular calcifications are present. Skull: Calvarium appears intact. Sinuses/Orbits: Opacification  of ethmoid air cells. Mild mucosal thickening in the maxillary antra. Mastoid air cells are clear. Other: No significant changes since the previous study. IMPRESSION: Right subdural hematoma measuring 6 mm maximal depth. Effacement of sulci and lateral ventricle. No progression since previous study. Electronically Signed   By: Lucienne Capers M.D.   On: 11/30/2016 05:42   Ct Head Wo Contrast  Result Date: 11/29/2016 CLINICAL DATA:  Frontal headache since yesterday morning. EXAM: CT HEAD WITHOUT CONTRAST TECHNIQUE: Contiguous axial images were obtained from the base of the skull through the vertex without intravenous contrast. COMPARISON:  08/23/2014 FINDINGS: Brain: Acute right 6 mm thick subdural hematoma along the convexity of the right cerebral hemisphere and along the falx and tentorium appear. No midline shift. Cerebral atrophy with chronic small vessel ischemia. No intra-axial mass nor large vascular territory infarct. No hydrocephalus. Vascular: No hyperdense vessels. Moderate atherosclerosis of the cavernous internal carotids. Skull: Negative for fracture or focal lesions. Sinuses/Orbits: Mild ethmoid sinus mucosal thickening. Intact orbits and globes. Other: None  IMPRESSION: 1. Acute right-sided subdural hematoma measuring up to 6 mm in thickness along the convexity of the right cerebrum. Critical Value/emergent results were called by telephone at the time of interpretation on 11/29/2016 at 8:25 pm to [facility] , who verbally acknowledged these results. 2. Chronic small vessel ischemic disease. Electronically Signed   By: Ashley Royalty M.D.   On: 11/29/2016 20:25     Assessment/Plan: 61 year old male came in to the ED for his headache that he has had since yesterday. CT shows an acute right sided subdural hematoma. He is to stop his coumadin at this time and not receive any heparin during dialysis. Will rescan head in about a week. Patient is doing well, no surgical intervention needed at this time.    Ocie Cornfield Kelsie Zaborowski 11/30/2016 10:11 AM

## 2016-12-01 DIAGNOSIS — I481 Persistent atrial fibrillation: Secondary | ICD-10-CM

## 2016-12-01 DIAGNOSIS — Z992 Dependence on renal dialysis: Secondary | ICD-10-CM

## 2016-12-01 DIAGNOSIS — N186 End stage renal disease: Secondary | ICD-10-CM

## 2016-12-01 DIAGNOSIS — S065X9A Traumatic subdural hemorrhage with loss of consciousness of unspecified duration, initial encounter: Secondary | ICD-10-CM

## 2016-12-01 LAB — CBC
HCT: 31.3 % — ABNORMAL LOW (ref 39.0–52.0)
Hemoglobin: 9.9 g/dL — ABNORMAL LOW (ref 13.0–17.0)
MCH: 29.9 pg (ref 26.0–34.0)
MCHC: 31.6 g/dL (ref 30.0–36.0)
MCV: 94.6 fL (ref 78.0–100.0)
PLATELETS: 198 10*3/uL (ref 150–400)
RBC: 3.31 MIL/uL — AB (ref 4.22–5.81)
RDW: 15.7 % — AB (ref 11.5–15.5)
WBC: 8 10*3/uL (ref 4.0–10.5)

## 2016-12-01 LAB — RENAL FUNCTION PANEL
Albumin: 2.9 g/dL — ABNORMAL LOW (ref 3.5–5.0)
Anion gap: 12 (ref 5–15)
BUN: 51 mg/dL — AB (ref 6–20)
CALCIUM: 8.3 mg/dL — AB (ref 8.9–10.3)
CHLORIDE: 94 mmol/L — AB (ref 101–111)
CO2: 21 mmol/L — AB (ref 22–32)
CREATININE: 9.71 mg/dL — AB (ref 0.61–1.24)
GFR calc Af Amer: 6 mL/min — ABNORMAL LOW (ref >60)
GFR calc non Af Amer: 5 mL/min — ABNORMAL LOW (ref >60)
GLUCOSE: 121 mg/dL — AB (ref 65–99)
Phosphorus: 5.9 mg/dL — ABNORMAL HIGH (ref 2.5–4.6)
Potassium: 3.9 mmol/L (ref 3.5–5.1)
SODIUM: 127 mmol/L — AB (ref 135–145)

## 2016-12-01 LAB — GLUCOSE, CAPILLARY: GLUCOSE-CAPILLARY: 114 mg/dL — AB (ref 65–99)

## 2016-12-01 MED ORDER — CARVEDILOL 25 MG PO TABS: 25.0000 mg | ORAL_TABLET | ORAL | Status: DC

## 2016-12-01 MED ORDER — SILVER SULFADIAZINE 1 % EX CREA
1.0000 "application " | TOPICAL_CREAM | Freq: Two times a day (BID) | CUTANEOUS | Status: DC
  Administered 2016-12-01: 1 via TOPICAL
  Filled 2016-12-01: qty 85

## 2016-12-01 MED ORDER — DARBEPOETIN ALFA 100 MCG/0.5ML IJ SOSY
PREFILLED_SYRINGE | INTRAMUSCULAR | Status: AC
  Filled 2016-12-01: qty 0.5

## 2016-12-01 NOTE — Progress Notes (Signed)
Pt being discharged per orders from MD. Pt educated on discharge instructions. Pt verbalized understanding of instructions. All questions and concerns were addressed. Pt's IV removed prior to discharge. Pt exited hospital via wheelchair.

## 2016-12-01 NOTE — Discharge Summary (Signed)
Physician Discharge Summary  Angelus Hoopes FTD:[id number] DOB: [birth year] DOA: 11/29/2016  PCP: [name], MD  Admit date: 11/29/2016 Discharge date: 12/01/2016   Recommendations for Outpatient Follow-Up:   1. Outpatient follow up with NS for repeat head CT in 1 week 2. Coumadin held until resumed by NS-- no heparin with HD   Discharge Diagnosis:   Principal Problem:   Subdural hematoma (HCC) Active Problems:   ESRD (end stage renal disease) on dialysis (HCC)   Atrial fibrillation- failed Amiodarone   OSA (obstructive sleep apnea)- on C-pap   Discharge disposition:  Home.  Discharge Condition: Improved.  Diet recommendation: renal  Wound care: None.   History of Present Illness:   Daniel Whitney is a 61 y.o. male with history of atrial fibrillation on Coumadin, ESRD on hemodialysis, hypertension, anemia and diet-controlled diabetes who presents to the ER with complaints of persistent headache.  Patient has been having a frontal headache for last 2 days which has been worsening with severe intensity.  Patient also has had at least 4 episodes of nausea vomiting.  Denies any weakness of the upper or lower extremities.  Patient states that he did have a fall 2 weeks ago but at that time he did not hit his head or lose consciousness.   Hospital Course by Problem:   Subdural:  -per NS: 61 year old male came in to the ED for his headache that he has had since yesterday. CT shows an acute right sided subdural hematoma. He is to stop his coumadin at this time and not receive any heparin during dialysis. Will rescan head in about a week. Patient is doing well, no surgical intervention needed at this time.   ESRD -resume HD with no heparin  A fib -coumadin held-- resume when ok with NS    Medical Consultants:    NS   Discharge Exam:   Vitals:   12/01/16 1118 12/01/16 1244  BP: 140/81 136/77  Pulse: 100 92  Resp: 18 18  Temp: 97.9 F (36.6 C) 97.8 F (36.6  C)  SpO2: 99% 100%   Vitals:   12/01/16 1030 12/01/16 1100 12/01/16 1118 12/01/16 1244  BP: (!) 143/85 129/83 140/81 136/77  Pulse: 81 (!) 102 100 92  Resp:   18 18  Temp:   97.9 F (36.6 C) 97.8 F (36.6 C)  TempSrc:   Oral Oral  SpO2:   99% 100%  Weight:      Height:        Gen:  NAD    The results of significant diagnostics from this hospitalization (including imaging, microbiology, ancillary and laboratory) are listed below for reference.     Procedures and Diagnostic Studies:   Ct Head Wo Contrast  Result Date: 11/30/2016 CLINICAL DATA:  Follow-up intracranial hemorrhage. Patient states he is feeling better. No pain. EXAM: CT HEAD WITHOUT CONTRAST TECHNIQUE: Contiguous axial images were obtained from the base of the skull through the vertex without intravenous contrast. COMPARISON:  11/29/2016 FINDINGS: Brain: Acute right subdural hematoma extending along the right frontoparietal region and along the falx and tentorium. Maximal depth measures about 7 mm. No significant change since previous study. There is no significant midline shift. There is effacement of sulci and lateral right ventricles. No new hemorrhage is identified. Gray-white matter junctions remain distinct. Basal cisterns are not effaced. Vascular: Vascular calcifications are present. Skull: Calvarium appears intact. Sinuses/Orbits: Opacification of ethmoid air cells. Mild mucosal thickening in the maxillary antra. Mastoid air cells are clear.  Other: No significant changes since the previous study. IMPRESSION: Right subdural hematoma measuring 6 mm maximal depth. Effacement of sulci and lateral ventricle. No progression since previous study. Electronically Signed   By: Lucienne Capers M.D.   On: 11/30/2016 05:42   Ct Head Wo Contrast  Result Date: 11/29/2016 CLINICAL DATA:  Frontal headache since yesterday morning. EXAM: CT HEAD WITHOUT CONTRAST TECHNIQUE: Contiguous axial images were obtained from the base of  the skull through the vertex without intravenous contrast. COMPARISON:  08/23/2014 FINDINGS: Brain: Acute right 6 mm thick subdural hematoma along the convexity of the right cerebral hemisphere and along the falx and tentorium appear. No midline shift. Cerebral atrophy with chronic small vessel ischemia. No intra-axial mass nor large vascular territory infarct. No hydrocephalus. Vascular: No hyperdense vessels. Moderate atherosclerosis of the cavernous internal carotids. Skull: Negative for fracture or focal lesions. Sinuses/Orbits: Mild ethmoid sinus mucosal thickening. Intact orbits and globes. Other: None IMPRESSION: 1. Acute right-sided subdural hematoma measuring up to 6 mm in thickness along the convexity of the right cerebrum. Critical Value/emergent results were called by telephone at the time of interpretation on 11/29/2016 at 8:25 pm to [facility] , who verbally acknowledged these results. 2. Chronic small vessel ischemic disease. Electronically Signed   By: Ashley Royalty M.D.   On: 11/29/2016 20:25     Labs:   Basic Metabolic Panel:  Recent Labs Lab 11/29/16 1921 11/30/16 0218 12/01/16 0752  NA 132* 131* 127*  K 4.0 3.9 3.9  CL 94* 95* 94*  CO2 23 25 21*  GLUCOSE 106* 199* 121*  BUN 28* 31* 51*  CREATININE 7.26* 7.80* 9.71*  CALCIUM 9.0 8.4* 8.3*  PHOS  --   --  5.9*   GFR Estimated Creatinine Clearance: 10.1 mL/min (A) (by C-G formula based on SCr of 9.71 mg/dL (H)). Liver Function Tests:  Recent Labs Lab 11/29/16 1921 12/01/16 0752  AST 20  --   ALT 13*  --   ALKPHOS 109  --   BILITOT 0.7  --   PROT 8.0  --   ALBUMIN 3.4* 2.9*    Recent Labs Lab 11/29/16 1921  LIPASE 50   No results for input(s): AMMONIA in the last 168 hours. Coagulation profile  Recent Labs Lab 11/28/16 11/29/16 1929 11/30/16 0218 11/30/16 0540 11/30/16 1123  INR 3.1* 3.02 1.18 1.17 1.10    CBC:  Recent Labs Lab 11/29/16 1929 11/30/16 0218 12/01/16 0752  WBC 7.5 6.8  8.0  HGB 11.9* 10.6* 9.9*  HCT 37.8* 33.7* 31.3*  MCV 96.2 97.1 94.6  PLT 217 198 198   Cardiac Enzymes: No results for input(s): CKTOTAL, CKMB, CKMBINDEX, TROPONINI in the last 168 hours. BNP: Invalid input(s): POCBNP CBG:  Recent Labs Lab 11/30/16 0006 11/30/16 0605 11/30/16 1117 11/30/16 2059 12/01/16 0609  GLUCAP 82 164* 154* 158* 114*   D-Dimer No results for input(s): DDIMER in the last 72 hours. Hgb A1c No results for input(s): HGBA1C in the last 72 hours. Lipid Profile No results for input(s): CHOL, HDL, LDLCALC, TRIG, CHOLHDL, LDLDIRECT in the last 72 hours. Thyroid function studies No results for input(s): TSH, T4TOTAL, T3FREE, THYROIDAB in the last 72 hours.  Invalid input(s): FREET3 Anemia work up No results for input(s): VITAMINB12, FOLATE, FERRITIN, TIBC, IRON, RETICCTPCT in the last 72 hours. Microbiology No results found for this or any previous visit (from the past 240 hour(s)).   Discharge Instructions:   Discharge Instructions    Discharge instructions    Complete  by:  As directed    Call Dr. Lamonte Sakai to arrange outpatient sleep study CT scan of head 1 week Renal diet NO HEPARIN PRODUCTS WITH HD UNTIL OK WITH NEUROSURGERY DO NOT START WARFARIN UNTIL INSTRUCTED BY NEUROSURGERY   Increase activity slowly    Complete by:  As directed      Allergies as of 12/01/2016      Reactions   Tobacco [nicotiana Tabacum] Shortness Of Breath      Medication List    STOP taking these medications   amLODipine 2.5 MG tablet Commonly known as:  NORVASC   warfarin 5 MG tablet Commonly known as:  COUMADIN     TAKE these medications   acetaminophen 650 MG CR tablet Commonly known as:  TYLENOL Take 650 mg by mouth every 8 (eight) hours as needed for pain.   allopurinol 100 MG tablet Commonly known as:  ZYLOPRIM Take 1 tablet (100 mg total) by mouth 2 (two) times daily.   amitriptyline 50 MG tablet Commonly known as:  ELAVIL Take 1 tablet (50 mg  total) by mouth at bedtime.   b complex-vitamin c-folic acid 0.8 MG Tabs tablet Take 1 tablet by mouth daily.   carvedilol 25 MG tablet Commonly known as:  COREG Take 1 tablet (25 mg total) by mouth See admin instructions. Give 12.5 tablet BID on M-W-fri, give 1 tablet qd on Tue-Thu-Sun-Sat What changed:  additional instructions   docusate sodium 100 MG capsule Commonly known as:  COLACE Take 100 mg by mouth daily as needed.   ferric citrate 1 GM 210 MG(Fe) tablet Commonly known as:  AURYXIA Take 1 tablet (210 mg total) by mouth 3 (three) times daily with meals.   gabapentin 100 MG capsule Commonly known as:  NEURONTIN Take 2 capsules (200 mg total) by mouth at bedtime. 200 mg qam and 300 mg upon return from dialysis on M-W-F for neuropathy   GOLD BOND MEDICATED ANTI ITCH EX Apply 1 application topically daily as needed. Apply to skin twice a day for itching.   MUCINEX DM 30-600 MG Tb12 Take 1 tablet by mouth daily as needed.   oxyCODONE-acetaminophen 5-325 MG tablet Commonly known as:  PERCOCET/ROXICET Take 1 tablet by mouth every 8 (eight) hours as needed for severe pain.   sennosides-docusate sodium 8.6-50 MG tablet Commonly known as:  SENOKOT-S Take 1 tablet by mouth daily as needed.   silver sulfADIAZINE 1 % cream Commonly known as:  SILVADENE Apply 1 application topically daily. Apply to left knee wound   simvastatin 20 MG tablet Commonly known as:  ZOCOR Take 1 tablet (20 mg total) by mouth daily. What changed:  when to take this   sodium chloride 0.65 % Soln nasal spray Commonly known as:  OCEAN Place 1 spray into both nostrils as needed for congestion.      Follow-up Information    Koirala, Dibas, MD Follow up in 1 week(s).   Specialty:  Family Medicine Contact information: [place] [address] [phone]        Eleonore Chiquito, NP Follow up in 1 week(s).   Specialty:  Neurosurgery Why:  for repeat  head CT-- please call Monday to get appointment Contact information: [address] [place] [address] [phone]            Time coordinating discharge: 35 min  Signed:  Donzell Coller Alison Stalling   [facility] 12/01/2016, 1:32 PM

## 2016-12-04 DIAGNOSIS — N2581 Secondary hyperparathyroidism of renal origin: Secondary | ICD-10-CM | POA: Diagnosis not present

## 2016-12-04 DIAGNOSIS — N186 End stage renal disease: Secondary | ICD-10-CM | POA: Diagnosis not present

## 2016-12-04 DIAGNOSIS — D631 Anemia in chronic kidney disease: Secondary | ICD-10-CM | POA: Diagnosis not present

## 2016-12-04 DIAGNOSIS — E1129 Type 2 diabetes mellitus with other diabetic kidney complication: Secondary | ICD-10-CM | POA: Diagnosis not present

## 2016-12-04 DIAGNOSIS — Z992 Dependence on renal dialysis: Secondary | ICD-10-CM | POA: Diagnosis not present

## 2016-12-07 ENCOUNTER — Emergency Department (HOSPITAL_COMMUNITY)
Admission: EM | Admit: 2016-12-07 | Discharge: 2016-12-07 | Disposition: A | Discharge status: Home / Self Care | Payer: Medicare Other | Attending: Emergency Medicine | Admitting: Emergency Medicine

## 2016-12-07 ENCOUNTER — Emergency Department (HOSPITAL_COMMUNITY): Payer: Medicare Other

## 2016-12-07 ENCOUNTER — Encounter (HOSPITAL_COMMUNITY): Payer: Self-pay | Admitting: *Deleted

## 2016-12-07 DIAGNOSIS — E039 Hypothyroidism, unspecified: Secondary | ICD-10-CM | POA: Insufficient documentation

## 2016-12-07 DIAGNOSIS — M1712 Unilateral primary osteoarthritis, left knee: Secondary | ICD-10-CM

## 2016-12-07 DIAGNOSIS — E1122 Type 2 diabetes mellitus with diabetic chronic kidney disease: Secondary | ICD-10-CM | POA: Insufficient documentation

## 2016-12-07 DIAGNOSIS — R519 Headache, unspecified: Secondary | ICD-10-CM

## 2016-12-07 DIAGNOSIS — I5043 Acute on chronic combined systolic (congestive) and diastolic (congestive) heart failure: Secondary | ICD-10-CM | POA: Diagnosis not present

## 2016-12-07 DIAGNOSIS — Z79899 Other long term (current) drug therapy: Secondary | ICD-10-CM | POA: Diagnosis not present

## 2016-12-07 DIAGNOSIS — N186 End stage renal disease: Secondary | ICD-10-CM | POA: Diagnosis not present

## 2016-12-07 DIAGNOSIS — Z96653 Presence of artificial knee joint, bilateral: Secondary | ICD-10-CM | POA: Insufficient documentation

## 2016-12-07 DIAGNOSIS — R51 Headache: Secondary | ICD-10-CM | POA: Diagnosis not present

## 2016-12-07 DIAGNOSIS — S065X9A Traumatic subdural hemorrhage with loss of consciousness of unspecified duration, initial encounter: Secondary | ICD-10-CM | POA: Diagnosis not present

## 2016-12-07 DIAGNOSIS — Z992 Dependence on renal dialysis: Secondary | ICD-10-CM | POA: Diagnosis not present

## 2016-12-07 DIAGNOSIS — Z96641 Presence of right artificial hip joint: Secondary | ICD-10-CM | POA: Diagnosis not present

## 2016-12-07 DIAGNOSIS — N2581 Secondary hyperparathyroidism of renal origin: Secondary | ICD-10-CM | POA: Diagnosis not present

## 2016-12-07 DIAGNOSIS — I132 Hypertensive heart and chronic kidney disease with heart failure and with stage 5 chronic kidney disease, or end stage renal disease: Secondary | ICD-10-CM | POA: Insufficient documentation

## 2016-12-07 DIAGNOSIS — D631 Anemia in chronic kidney disease: Secondary | ICD-10-CM | POA: Diagnosis not present

## 2016-12-07 DIAGNOSIS — Z8679 Personal history of other diseases of the circulatory system: Secondary | ICD-10-CM | POA: Diagnosis not present

## 2016-12-07 DIAGNOSIS — E1129 Type 2 diabetes mellitus with other diabetic kidney complication: Secondary | ICD-10-CM | POA: Diagnosis not present

## 2016-12-07 LAB — CBC WITH DIFFERENTIAL/PLATELET
BASOS PCT: 1 %
Basophils Absolute: 0.1 10*3/uL (ref 0.0–0.1)
EOS ABS: 0.2 10*3/uL (ref 0.0–0.7)
EOS PCT: 3 %
HCT: 35.5 % — ABNORMAL LOW (ref 39.0–52.0)
Hemoglobin: 11.1 g/dL — ABNORMAL LOW (ref 13.0–17.0)
LYMPHS ABS: 1.6 10*3/uL (ref 0.7–4.0)
Lymphocytes Relative: 18 %
MCH: 30.7 pg (ref 26.0–34.0)
MCHC: 31.3 g/dL (ref 30.0–36.0)
MCV: 98.3 fL (ref 78.0–100.0)
MONO ABS: 0.7 10*3/uL (ref 0.1–1.0)
MONOS PCT: 8 %
NEUTROS PCT: 72 %
Neutro Abs: 6.6 10*3/uL (ref 1.7–7.7)
Platelets: 298 10*3/uL (ref 150–400)
RBC: 3.61 MIL/uL — ABNORMAL LOW (ref 4.22–5.81)
RDW: 16.3 % — AB (ref 11.5–15.5)
WBC: 9.2 10*3/uL (ref 4.0–10.5)

## 2016-12-07 LAB — COMPREHENSIVE METABOLIC PANEL
ALBUMIN: 3.5 g/dL (ref 3.5–5.0)
ALK PHOS: 109 U/L (ref 38–126)
ALT: 14 U/L — ABNORMAL LOW (ref 17–63)
ANION GAP: 12 (ref 5–15)
AST: 19 U/L (ref 15–41)
BUN: 31 mg/dL — ABNORMAL HIGH (ref 6–20)
CALCIUM: 8.7 mg/dL — AB (ref 8.9–10.3)
CO2: 27 mmol/L (ref 22–32)
Chloride: 95 mmol/L — ABNORMAL LOW (ref 101–111)
Creatinine, Ser: 7.22 mg/dL — ABNORMAL HIGH (ref 0.61–1.24)
GFR calc Af Amer: 8 mL/min — ABNORMAL LOW (ref >60)
GFR calc non Af Amer: 7 mL/min — ABNORMAL LOW (ref >60)
GLUCOSE: 96 mg/dL (ref 65–99)
POTASSIUM: 4.8 mmol/L (ref 3.5–5.1)
SODIUM: 134 mmol/L — AB (ref 135–145)
TOTAL PROTEIN: 8.3 g/dL — AB (ref 6.5–8.1)
Total Bilirubin: 0.5 mg/dL (ref 0.3–1.2)

## 2016-12-07 LAB — I-STAT CHEM 8, ED
BUN: 32 mg/dL — ABNORMAL HIGH (ref 6–20)
CALCIUM ION: 1.06 mmol/L — AB (ref 1.15–1.40)
CREATININE: 7.3 mg/dL — AB (ref 0.61–1.24)
Chloride: 97 mmol/L — ABNORMAL LOW (ref 101–111)
GLUCOSE: 95 mg/dL (ref 65–99)
HCT: 38 % — ABNORMAL LOW (ref 39.0–52.0)
HEMOGLOBIN: 12.9 g/dL — AB (ref 13.0–17.0)
POTASSIUM: 4.7 mmol/L (ref 3.5–5.1)
Sodium: 135 mmol/L (ref 135–145)
TCO2: 28 mmol/L (ref 22–32)

## 2016-12-07 LAB — PROTIME-INR
INR: 1.09
PROTHROMBIN TIME: 14 s (ref 11.4–15.2)

## 2016-12-07 MED ORDER — DIPHENHYDRAMINE HCL 25 MG PO CAPS
25.0000 mg | ORAL_CAPSULE | Freq: Once | ORAL | Status: AC
  Administered 2016-12-07: 25 mg via ORAL
  Filled 2016-12-07: qty 1

## 2016-12-07 MED ORDER — OXYCODONE-ACETAMINOPHEN 5-325 MG PO TABS: 1.0000 | ORAL_TABLET | Freq: Three times a day (TID) | ORAL | 0 refills | Status: DC | PRN

## 2016-12-07 MED ORDER — SILVER SULFADIAZINE 1 % EX CREA: 1.0000 "application " | TOPICAL_CREAM | Freq: Every day | CUTANEOUS | 0 refills | Status: DC

## 2016-12-07 MED ORDER — OXYCODONE-ACETAMINOPHEN 5-325 MG PO TABS
2.0000 | ORAL_TABLET | Freq: Once | ORAL | Status: AC
  Administered 2016-12-07: 2 via ORAL
  Filled 2016-12-07: qty 2

## 2016-12-07 NOTE — ED Notes (Signed)
Pt st;s he took tylenol this am for his headache without relief

## 2016-12-07 NOTE — ED Triage Notes (Signed)
Pt sent here from PCP, pt received HD today and had to leave 1 hr early d/t pain, pt recently discharged from here on Sunday for subdural hematoma, pt took Coumadin up until last Thursday, pt A&O x4, pupils equal and reactive at this time, pt reports terrible HA 10/10 pain, vitals WNL

## 2016-12-07 NOTE — ED Provider Notes (Signed)
[facility] EMERGENCY DEPARTMENT Provider Note   CSN: [id number] Arrival date & time: 12/07/16  1755     History   Chief Complaint Chief Complaint  Patient presents with  . Headache    HPI Daniel Whitney is a 61 y.o. male.  61 year old male presents to the emergency department for evaluation of a headache. He was recently discharged after admission for a subdural hematoma. He states that he has had a headache intermittently since discharge, the pain worsened today. He describes the pain primarily behind his bilateral eyes and central forehead between his eyebrows. He describes the pain as a sharp sensation which has mildly improved since taking Tylenol. He has had aggravation of his pain with prolonged speech, loud noises, increased activity. Headache caused him to leave dialysis one hour early. He did go see his primary care doctor who advised that he come to the emergency department as he was previously on Coumadin. He has not had any vision loss, nausea, vomiting, extremity numbness, paresthesias, or extremity weakness. No fevers.      Past Medical History:  Diagnosis Date  . Allergic rhinitis 07/07/2014  . Anemia due to other cause 07/07/2014  . Arthritis    "all over"  . Atrial fibrillation ([place]) 12/30/2012  . CHF (congestive heart failure) ([place]) 12/30/2012  . Chronic lower back pain   . Diabetes mellitus with neuropathy ([place])   . Diabetic retinopathy ([place]) 12/30/2012   no medications  . Dysrhythmia    Afib  . ESRD (end stage renal disease) on dialysis ([place]) started in 2013   MWF; [facility]; [employer]  . GERD (gastroesophageal reflux disease)   . Glaucoma   . History of gout    "right big toe"  . Hyperlipidemia   . Hypertension   . Hypothyroid    01/17/16- no longer on meduication  . Morbid obesity ([place]) 12/30/2012  . Myocardial infarction ([place])    "I've had ~ 3; last one was in ~ 10/2013" (01/30/2014)  . OSA (obstructive sleep apnea)  12/30/2012   "lost weight; no longer needed CPAP; retested said I needed it; didn't followup cause I was feeling fine" (01/30/2014)  . Pulmonary hypertension ([place]) 12/30/2012  . Refusal of blood transfusions as patient is Jehovah's Witness   . Stroke [facility]) ~ 2005; ~ 2005   "they were mild; I didn't even notice I'd had them"; denies residual on 01/30/2014    Patient Active Problem List   Diagnosis Date Noted  . Subdural hematoma ([place]) 11/29/2016  . Peripheral neuropathy 07/26/2016  . At risk for adverse drug reaction 07/03/2016  . Primary osteoarthritis of left knee 06/26/2016  . Postoperative anemia due to acute blood loss 01/27/2016  . S/P knee replacement 01/24/2016  . Low back pain 02/09/2015  . Bilateral knee pain 01/27/2015  . Atypical atrial flutter ([place]) 01/24/2015  . Acute on chronic combined systolic and diastolic heart failure ([place]) 01/05/2015  . Hyperkalemia 11/08/2014  . Hypocalcemia 11/08/2014  . Recurrent falls 10/13/2014  . Venous stasis ulcer ([place]) 10/13/2014  . Hypoxia   . Leukocytosis   . Respiratory failure ([place])   . ARDS (adult respiratory distress syndrome) ([place])   . Encounter for central line placement   . Cardiopulmonary arrest ([place])   . Compression fracture of L3 lumbar vertebra ([place]) 08/04/2014  . Anemia of chronic renal failure, unspecified stage 07/07/2014  . Allergic rhinitis 07/07/2014  . Right rotator cuff tear 04/27/2014  . Acute gouty arthritis 11/11/2013  . Hearing loss  in right ear 11/11/2013  . Chronic pain syndrome 11/11/2013  . Right shoulder pain 11/11/2013  . Facial rash 11/01/2013  . Right otitis externa 10/27/2013  . Pulmonary HTN- PA 58 mmHg 04/16/13 09/17/2013  . Right hip pain 08/04/2013  . DOE (dyspnea on exertion) 06/18/2013  . Allergic rhinitis, cause unspecified 06/18/2013  . Carpal tunnel syndrome 06/06/2013  . Primary localized osteoarthrosis, lower leg 06/04/2013  . Preoperative cardiovascular examination 03/10/2013  .  Preventative health care 12/30/2012  . Atrial fibrillation- failed Amiodarone 12/30/2012  . CHF (congestive heart failure) ([place]) 12/30/2012  . Diabetic retinopathy ([place]) 12/30/2012  . Morbid obesity ([place]) 12/30/2012  . OSA (obstructive sleep apnea)- on C-pap 12/30/2012  . Bilateral hearing loss 12/30/2012  . Long term (current) use of anticoagulants 12/30/2012  . Arthritis   . Glaucoma   . Diabetes mellitus with neuropathy ([place])   . Hypertension   . ESRD (end stage renal disease) on dialysis ([place])   . Stroke ([place])   . Hyperlipidemia     Past Surgical History:  Procedure Laterality Date  . AV FISTULA PLACEMENT Left ~ 10/2013   "forearm"  . CARDIAC CATHETERIZATION N/A 01/07/2015   Procedure: Left Heart Cath and Coronary Angiography;  Surgeon: Troy Sine, MD;  Location: [facility];  Service: Cardiovascular;  Laterality: N/A;  . CLOSED REDUCTION HIP DISLOCATION Right 1970's  . JOINT REPLACEMENT    . TOTAL HIP ARTHROPLASTY Right 1980  . TOTAL KNEE ARTHROPLASTY Right 01/24/2016   Procedure: TOTAL KNEE ARTHROPLASTY;  Surgeon: Dorna Leitz, MD;  Location: [facility];  Service: Orthopedics;  Laterality: Right;  . TOTAL KNEE ARTHROPLASTY Left 06/26/2016   Procedure: TOTAL KNEE ARTHROPLASTY;  Surgeon: Dorna Leitz, MD;  Location: [facility];  Service: Orthopedics;  Laterality: Left;       Home Medications    Prior to Admission medications   Medication Sig Start Date End Date Taking? Authorizing Provider  acetaminophen (TYLENOL) 650 MG CR tablet Take 650 mg by mouth every 8 (eight) hours as needed for pain.   Yes [provider]  allopurinol (ZYLOPRIM) 100 MG tablet Take 1 tablet (100 mg total) by mouth 2 (two) times daily. 07/31/16  Yes Medina-Vargas, Monina C, NP  amitriptyline (ELAVIL) 50 MG tablet Take 1 tablet (50 mg total) by mouth at bedtime. 07/31/16  Yes Medina-Vargas, Monina C, NP  b complex-vitamin c-folic acid (NEPHRO-VITE) 0.8 MG TABS tablet Take 1 tablet by mouth  daily.   Yes [provider]  carvedilol (COREG) 25 MG tablet Take 1 tablet (25 mg total) by mouth See admin instructions. Give 12.5 tablet BID on M-W-fri, give 1 tablet qd on Tue-Thu-Sun-Sat Patient taking differently: Take 12.5-25 mg by mouth See admin instructions. Give 12.5mg  twice daily MW and 25mg  daily on Tue-Thu-Sun-Sat 12/01/16  Yes Vann, Jessica U, DO  Dextromethorphan-Guaifenesin (MUCINEX DM) 30-600 MG TB12 Take 1 tablet by mouth daily as needed.    Yes [provider]  docusate sodium (COLACE) 100 MG capsule Take 100 mg by mouth daily as needed.    Yes [provider]  gabapentin (NEURONTIN) 100 MG capsule Take 2 capsules (200 mg total) by mouth at bedtime. 200 mg qam and 300 mg upon return from dialysis on M-W-F for neuropathy 07/31/16  Yes Medina-Vargas, Monina C, NP  Pramoxine-Menthol-Dimethicone (GOLD BOND MEDICATED ANTI ITCH EX) Apply 1 application topically daily as needed. Apply to skin twice a day for itching.    Yes [provider]  simvastatin (ZOCOR) 20  MG tablet Take 1 tablet (20 mg total) by mouth daily. Patient taking differently: Take 20 mg by mouth at bedtime.  07/31/16  Yes Medina-Vargas, Monina C, NP  sodium chloride (OCEAN) 0.65 % SOLN nasal spray Place 1 spray into both nostrils as needed for congestion. 01/31/14  Yes Ghimire, Henreitta Leber, MD  ferric citrate (AURYXIA) 1 GM 210 MG(Fe) tablet Take 1 tablet (210 mg total) by mouth 3 (three) times daily with meals. Patient not taking: Reported on 11/29/2016 07/31/16   Medina-Vargas, Monina C, NP  oxyCODONE-acetaminophen (PERCOCET/ROXICET) 5-325 MG tablet Take 1-2 tablets by mouth every 8 (eight) hours as needed for severe pain. 12/07/16   Antonietta Breach, PA-C  silver sulfADIAZINE (SILVADENE) 1 % cream Apply 1 application topically daily. Apply to wound 12/07/16   Antonietta Breach, PA-C    Family History Family History  Problem Relation Age of Onset  . Arthritis Other   . Heart disease Other     . Hyperlipidemia Other   . Hypertension Other   . Kidney disease Other   . Diabetes Other   . Stroke Other   . Diabetes Mother   . Cancer Father        bone cancer  . Heart disease Father   . Diabetes Sister   . Diabetes Brother   . Stroke Brother     Social History Social History  Substance Use Topics  . Smoking status: Never Smoker  . Smokeless tobacco: Never Used  . Alcohol use 4.2 - 4.8 oz/week    1 Cans of beer, 3 Shots of liquor, 3 - 4 Standard drinks or equivalent per week     Comment: 07/03/16 1 pint 2-3X/week     Allergies   Tobacco [nicotiana tabacum]   Review of Systems Review of Systems Ten systems reviewed and are negative for acute change, except as noted in the HPI.    Physical Exam Updated Vital Signs BP (!) 139/92   Pulse 100   Temp 98.3 F (36.8 C) (Oral)   Resp 18   SpO2 100%   Physical Exam  Constitutional: He is oriented to person, place, and time. He appears well-developed and well-nourished. No distress.  Nontoxic appearing and in NAD  HENT:  Head: Normocephalic and atraumatic.  Mouth/Throat: Oropharynx is clear and moist.  Tongue midline. Symmetric rise of the uvula with phonation.  Eyes: Pupils are equal, round, and reactive to light. Conjunctivae and EOM are normal. No scleral icterus.  Eyebrow raise symmetric  Neck: Normal range of motion.  Cardiovascular: Regular rhythm and intact distal pulses.   Borderline tachycardia. Max HR 101.  Pulmonary/Chest: Effort normal. No respiratory distress.  Respirations even and unlabored  Musculoskeletal: Normal range of motion.  Neurological: He is alert and oriented to person, place, and time. He exhibits normal muscle tone. Coordination normal.  GCS 15. Speech is oriented. No focal neurologic deficits appreciated.  Skin: Skin is warm and dry. No rash noted. He is not diaphoretic. No erythema. No pallor.  Psychiatric: He has a normal mood and affect. His behavior is normal.  Nursing note  and vitals reviewed.    ED Treatments / Results  Labs (all labs ordered are listed, but only abnormal results are displayed) Labs Reviewed  CBC WITH DIFFERENTIAL/PLATELET - Abnormal; Notable for the following:       Result Value   RBC 3.61 (*)    Hemoglobin 11.1 (*)    HCT 35.5 (*)    RDW 16.3 (*)    All  other components within normal limits  COMPREHENSIVE METABOLIC PANEL - Abnormal; Notable for the following:    Sodium 134 (*)    Chloride 95 (*)    BUN 31 (*)    Creatinine, Ser 7.22 (*)    Calcium 8.7 (*)    Total Protein 8.3 (*)    ALT 14 (*)    GFR calc non Af Amer 7 (*)    GFR calc Af Amer 8 (*)    All other components within normal limits  I-STAT CHEM 8, ED - Abnormal; Notable for the following:    Chloride 97 (*)    BUN 32 (*)    Creatinine, Ser 7.30 (*)    Calcium, Ion 1.06 (*)    Hemoglobin 12.9 (*)    HCT 38.0 (*)    All other components within normal limits  PROTIME-INR    EKG  EKG Interpretation None       Radiology Ct Head Wo Contrast  Result Date: 12/07/2016 CLINICAL DATA:  Headache beginning today after dialysis. Discharged from hospital "Sunday due to subdural hematoma. EXAM: CT HEAD WITHOUT CONTRAST TECHNIQUE: Contiguous axial images were obtained from the base of the skull through the vertex without intravenous contrast. COMPARISON:  11/30/2016, 11/29/2016 and 08/23/2014 FINDINGS: Brain: Ventricles and cisterns are within normal. There is chronic ischemic microvascular disease present. There is again evidence of patient's subacute right subdural hematoma which is now more isodense to cortex with minimal hyperdensity and unchanged in thickness measuring approximately 7 mm. Mild local sulcal effacement. No midline shift. No new areas of hemorrhage. Less pronounced acute hemorrhage along the falx. Remainder of the exam is unchanged. Vascular: No hyperdense vessel or unexpected calcification. Skull: Normal. Negative for fracture or focal lesion.  Sinuses/Orbits: Orbits are normal. Paranasal sinuses are well developed and well aerated. There is fenestration of the medial wall of the maxillary sinuses and minimal mucosal membrane thickening over the left maxillary sinus and mild opacification over the ethmoid air cells. Other: None. IMPRESSION: Known right convexity subacute subdural hematoma with minimal local sulcal effacement which is stable. No new hemorrhage. Mild chronic ischemic microvascular disease. Mild chronic sinus inflammatory change. Electronically Signed   By: Daniel  Boyle M.D.   On: 12/07/2016 19:00    Procedures Procedures (including critical care time)  Medications Ordered in ED Medications  oxyCODONE-acetaminophen (PERCOCET/ROXICET) 5-325 MG per tablet 2 tablet (2 tablets Oral Given 12/07/16 2243)  diphenhydrAMINE (BENADRYL) capsule 25 mg (25 mg Oral Given 12/07/16 2312)     Initial Impression / Assessment and Plan / ED Course  I have reviewed the triage vital signs and the nursing notes.  Pertinent labs & imaging results that were available during my care of the patient were reviewed by me and considered in my medical decision making (see chart for details).     61"  year old male with history of subdural hematoma requiring admission 8 days ago presents today for persistent and worsening headache. Patient is hemodynamically stable. Blood pressure is also similar compared with discharge. He has a nonfocal neurologic exam today and is no longer taking blood thinners. He has not had any nausea or vomiting. He does report some improvement in his headache since arrival.  Workup today is reassuring. No leukocytosis. CT head obtained which shows stable subdural hematoma. No evidence of new hemorrhage. Workup results discussed with the patient who verbalizes understanding. Will continue with outpatient pain control. Patient given Percocet in the emergency department for pain management. Primary care follow-up advised. Return  precautions  discussed and provided. Patient discharged in stable condition with no unaddressed concerns.   Final Clinical Impressions(s) / ED Diagnoses   Final diagnoses:  Bad headache  History of subdural hematoma    New Prescriptions Discharge Medication List as of 12/07/2016 10:29 PM       Antonietta Breach, PA-C 12/07/16 2351    Forde Dandy, MD 12/08/16 [place]

## 2016-12-11 ENCOUNTER — Encounter: Payer: Self-pay | Admitting: *Deleted

## 2016-12-11 ENCOUNTER — Ambulatory Visit: Payer: Medicare Other | Admitting: Internal Medicine

## 2016-12-11 DIAGNOSIS — E1129 Type 2 diabetes mellitus with other diabetic kidney complication: Secondary | ICD-10-CM | POA: Diagnosis not present

## 2016-12-11 DIAGNOSIS — N2581 Secondary hyperparathyroidism of renal origin: Secondary | ICD-10-CM | POA: Diagnosis not present

## 2016-12-11 DIAGNOSIS — D631 Anemia in chronic kidney disease: Secondary | ICD-10-CM | POA: Diagnosis not present

## 2016-12-11 DIAGNOSIS — Z992 Dependence on renal dialysis: Secondary | ICD-10-CM | POA: Diagnosis not present

## 2016-12-11 DIAGNOSIS — N186 End stage renal disease: Secondary | ICD-10-CM | POA: Diagnosis not present

## 2016-12-12 DIAGNOSIS — Z992 Dependence on renal dialysis: Secondary | ICD-10-CM | POA: Diagnosis not present

## 2016-12-12 DIAGNOSIS — N2581 Secondary hyperparathyroidism of renal origin: Secondary | ICD-10-CM | POA: Diagnosis not present

## 2016-12-12 DIAGNOSIS — E1129 Type 2 diabetes mellitus with other diabetic kidney complication: Secondary | ICD-10-CM | POA: Diagnosis not present

## 2016-12-12 DIAGNOSIS — N186 End stage renal disease: Secondary | ICD-10-CM | POA: Diagnosis not present

## 2016-12-12 DIAGNOSIS — I120 Hypertensive chronic kidney disease with stage 5 chronic kidney disease or end stage renal disease: Secondary | ICD-10-CM | POA: Diagnosis not present

## 2016-12-12 DIAGNOSIS — D631 Anemia in chronic kidney disease: Secondary | ICD-10-CM | POA: Diagnosis not present

## 2016-12-14 DIAGNOSIS — N186 End stage renal disease: Secondary | ICD-10-CM | POA: Diagnosis not present

## 2016-12-14 DIAGNOSIS — S065X9A Traumatic subdural hemorrhage with loss of consciousness of unspecified duration, initial encounter: Secondary | ICD-10-CM | POA: Diagnosis not present

## 2016-12-14 DIAGNOSIS — D631 Anemia in chronic kidney disease: Secondary | ICD-10-CM | POA: Diagnosis not present

## 2016-12-14 DIAGNOSIS — N2581 Secondary hyperparathyroidism of renal origin: Secondary | ICD-10-CM | POA: Diagnosis not present

## 2016-12-14 DIAGNOSIS — Z992 Dependence on renal dialysis: Secondary | ICD-10-CM | POA: Diagnosis not present

## 2016-12-15 DIAGNOSIS — Z96641 Presence of right artificial hip joint: Secondary | ICD-10-CM | POA: Diagnosis not present

## 2016-12-15 DIAGNOSIS — M75121 Complete rotator cuff tear or rupture of right shoulder, not specified as traumatic: Secondary | ICD-10-CM | POA: Diagnosis not present

## 2016-12-15 DIAGNOSIS — Z96652 Presence of left artificial knee joint: Secondary | ICD-10-CM | POA: Diagnosis not present

## 2016-12-15 DIAGNOSIS — Z96651 Presence of right artificial knee joint: Secondary | ICD-10-CM | POA: Diagnosis not present

## 2016-12-17 DIAGNOSIS — N2581 Secondary hyperparathyroidism of renal origin: Secondary | ICD-10-CM | POA: Diagnosis not present

## 2016-12-17 DIAGNOSIS — N186 End stage renal disease: Secondary | ICD-10-CM | POA: Diagnosis not present

## 2016-12-17 DIAGNOSIS — Z992 Dependence on renal dialysis: Secondary | ICD-10-CM | POA: Diagnosis not present

## 2016-12-17 DIAGNOSIS — D631 Anemia in chronic kidney disease: Secondary | ICD-10-CM | POA: Diagnosis not present

## 2016-12-19 ENCOUNTER — Other Ambulatory Visit: Payer: Self-pay | Admitting: Student

## 2016-12-19 DIAGNOSIS — Z992 Dependence on renal dialysis: Secondary | ICD-10-CM | POA: Diagnosis not present

## 2016-12-19 DIAGNOSIS — S065X9A Traumatic subdural hemorrhage with loss of consciousness of unspecified duration, initial encounter: Secondary | ICD-10-CM

## 2016-12-19 DIAGNOSIS — S065XAA Traumatic subdural hemorrhage with loss of consciousness status unknown, initial encounter: Secondary | ICD-10-CM

## 2016-12-19 DIAGNOSIS — D631 Anemia in chronic kidney disease: Secondary | ICD-10-CM | POA: Diagnosis not present

## 2016-12-19 DIAGNOSIS — N186 End stage renal disease: Secondary | ICD-10-CM | POA: Diagnosis not present

## 2016-12-19 DIAGNOSIS — N2581 Secondary hyperparathyroidism of renal origin: Secondary | ICD-10-CM | POA: Diagnosis not present

## 2016-12-20 ENCOUNTER — Inpatient Hospital Stay
Admission: RE | Admit: 2016-12-20 | Discharge: 2016-12-20 | Disposition: A | Discharge status: Home / Self Care | Payer: Medicare Other | Source: Ambulatory Visit | Attending: Student | Admitting: Student

## 2016-12-21 DIAGNOSIS — N186 End stage renal disease: Secondary | ICD-10-CM | POA: Diagnosis not present

## 2016-12-21 DIAGNOSIS — D631 Anemia in chronic kidney disease: Secondary | ICD-10-CM | POA: Diagnosis not present

## 2016-12-21 DIAGNOSIS — Z992 Dependence on renal dialysis: Secondary | ICD-10-CM | POA: Diagnosis not present

## 2016-12-21 DIAGNOSIS — N2581 Secondary hyperparathyroidism of renal origin: Secondary | ICD-10-CM | POA: Diagnosis not present

## 2016-12-24 DIAGNOSIS — Z992 Dependence on renal dialysis: Secondary | ICD-10-CM | POA: Diagnosis not present

## 2016-12-24 DIAGNOSIS — D631 Anemia in chronic kidney disease: Secondary | ICD-10-CM | POA: Diagnosis not present

## 2016-12-24 DIAGNOSIS — N2581 Secondary hyperparathyroidism of renal origin: Secondary | ICD-10-CM | POA: Diagnosis not present

## 2016-12-24 DIAGNOSIS — N186 End stage renal disease: Secondary | ICD-10-CM | POA: Diagnosis not present

## 2016-12-26 DIAGNOSIS — D631 Anemia in chronic kidney disease: Secondary | ICD-10-CM | POA: Diagnosis not present

## 2016-12-26 DIAGNOSIS — N2581 Secondary hyperparathyroidism of renal origin: Secondary | ICD-10-CM | POA: Diagnosis not present

## 2016-12-26 DIAGNOSIS — N186 End stage renal disease: Secondary | ICD-10-CM | POA: Diagnosis not present

## 2016-12-26 DIAGNOSIS — Z992 Dependence on renal dialysis: Secondary | ICD-10-CM | POA: Diagnosis not present

## 2016-12-27 ENCOUNTER — Ambulatory Visit
Admission: RE | Admit: 2016-12-27 | Discharge: 2016-12-27 | Disposition: A | Discharge status: Home / Self Care | Payer: Medicare Other | Source: Ambulatory Visit | Attending: Student | Admitting: Student

## 2016-12-27 DIAGNOSIS — S065X9A Traumatic subdural hemorrhage with loss of consciousness of unspecified duration, initial encounter: Secondary | ICD-10-CM

## 2016-12-27 DIAGNOSIS — S065XAA Traumatic subdural hemorrhage with loss of consciousness status unknown, initial encounter: Secondary | ICD-10-CM

## 2016-12-27 DIAGNOSIS — S065X0A Traumatic subdural hemorrhage without loss of consciousness, initial encounter: Secondary | ICD-10-CM | POA: Diagnosis not present

## 2016-12-28 DIAGNOSIS — N2581 Secondary hyperparathyroidism of renal origin: Secondary | ICD-10-CM | POA: Diagnosis not present

## 2016-12-28 DIAGNOSIS — D631 Anemia in chronic kidney disease: Secondary | ICD-10-CM | POA: Diagnosis not present

## 2016-12-28 DIAGNOSIS — N186 End stage renal disease: Secondary | ICD-10-CM | POA: Diagnosis not present

## 2016-12-28 DIAGNOSIS — Z992 Dependence on renal dialysis: Secondary | ICD-10-CM | POA: Diagnosis not present

## 2017-01-02 DIAGNOSIS — N186 End stage renal disease: Secondary | ICD-10-CM | POA: Diagnosis not present

## 2017-01-02 DIAGNOSIS — Z992 Dependence on renal dialysis: Secondary | ICD-10-CM | POA: Diagnosis not present

## 2017-01-02 DIAGNOSIS — N2581 Secondary hyperparathyroidism of renal origin: Secondary | ICD-10-CM | POA: Diagnosis not present

## 2017-01-02 DIAGNOSIS — D631 Anemia in chronic kidney disease: Secondary | ICD-10-CM | POA: Diagnosis not present

## 2017-01-04 DIAGNOSIS — N2581 Secondary hyperparathyroidism of renal origin: Secondary | ICD-10-CM | POA: Diagnosis not present

## 2017-01-04 DIAGNOSIS — Z992 Dependence on renal dialysis: Secondary | ICD-10-CM | POA: Diagnosis not present

## 2017-01-04 DIAGNOSIS — D631 Anemia in chronic kidney disease: Secondary | ICD-10-CM | POA: Diagnosis not present

## 2017-01-04 DIAGNOSIS — N186 End stage renal disease: Secondary | ICD-10-CM | POA: Diagnosis not present

## 2017-01-07 DIAGNOSIS — N2581 Secondary hyperparathyroidism of renal origin: Secondary | ICD-10-CM | POA: Diagnosis not present

## 2017-01-07 DIAGNOSIS — Z992 Dependence on renal dialysis: Secondary | ICD-10-CM | POA: Diagnosis not present

## 2017-01-07 DIAGNOSIS — D631 Anemia in chronic kidney disease: Secondary | ICD-10-CM | POA: Diagnosis not present

## 2017-01-07 DIAGNOSIS — N186 End stage renal disease: Secondary | ICD-10-CM | POA: Diagnosis not present

## 2017-01-08 ENCOUNTER — Encounter (HOSPITAL_BASED_OUTPATIENT_CLINIC_OR_DEPARTMENT_OTHER): Payer: Medicare Other | Attending: Surgery

## 2017-01-09 DIAGNOSIS — N2581 Secondary hyperparathyroidism of renal origin: Secondary | ICD-10-CM | POA: Diagnosis not present

## 2017-01-09 DIAGNOSIS — Z992 Dependence on renal dialysis: Secondary | ICD-10-CM | POA: Diagnosis not present

## 2017-01-09 DIAGNOSIS — M5136 Other intervertebral disc degeneration, lumbar region: Secondary | ICD-10-CM | POA: Diagnosis not present

## 2017-01-09 DIAGNOSIS — M47817 Spondylosis without myelopathy or radiculopathy, lumbosacral region: Secondary | ICD-10-CM | POA: Diagnosis not present

## 2017-01-09 DIAGNOSIS — N186 End stage renal disease: Secondary | ICD-10-CM | POA: Diagnosis not present

## 2017-01-09 DIAGNOSIS — Z79899 Other long term (current) drug therapy: Secondary | ICD-10-CM | POA: Diagnosis not present

## 2017-01-09 DIAGNOSIS — G894 Chronic pain syndrome: Secondary | ICD-10-CM | POA: Diagnosis not present

## 2017-01-09 DIAGNOSIS — D631 Anemia in chronic kidney disease: Secondary | ICD-10-CM | POA: Diagnosis not present

## 2017-01-09 DIAGNOSIS — Z79891 Long term (current) use of opiate analgesic: Secondary | ICD-10-CM | POA: Diagnosis not present

## 2017-01-09 DIAGNOSIS — M545 Low back pain: Secondary | ICD-10-CM | POA: Diagnosis not present

## 2017-01-11 DIAGNOSIS — N2581 Secondary hyperparathyroidism of renal origin: Secondary | ICD-10-CM | POA: Diagnosis not present

## 2017-01-11 DIAGNOSIS — Z992 Dependence on renal dialysis: Secondary | ICD-10-CM | POA: Diagnosis not present

## 2017-01-11 DIAGNOSIS — D631 Anemia in chronic kidney disease: Secondary | ICD-10-CM | POA: Diagnosis not present

## 2017-01-11 DIAGNOSIS — N186 End stage renal disease: Secondary | ICD-10-CM | POA: Diagnosis not present

## 2017-01-11 DIAGNOSIS — I120 Hypertensive chronic kidney disease with stage 5 chronic kidney disease or end stage renal disease: Secondary | ICD-10-CM | POA: Diagnosis not present

## 2017-01-14 DIAGNOSIS — N186 End stage renal disease: Secondary | ICD-10-CM | POA: Diagnosis not present

## 2017-01-14 DIAGNOSIS — Z992 Dependence on renal dialysis: Secondary | ICD-10-CM | POA: Diagnosis not present

## 2017-01-14 DIAGNOSIS — N2581 Secondary hyperparathyroidism of renal origin: Secondary | ICD-10-CM | POA: Diagnosis not present

## 2017-01-14 DIAGNOSIS — D631 Anemia in chronic kidney disease: Secondary | ICD-10-CM | POA: Diagnosis not present

## 2017-01-14 DIAGNOSIS — E1129 Type 2 diabetes mellitus with other diabetic kidney complication: Secondary | ICD-10-CM | POA: Diagnosis not present

## 2017-01-16 DIAGNOSIS — N186 End stage renal disease: Secondary | ICD-10-CM | POA: Diagnosis not present

## 2017-01-16 DIAGNOSIS — E1129 Type 2 diabetes mellitus with other diabetic kidney complication: Secondary | ICD-10-CM | POA: Diagnosis not present

## 2017-01-16 DIAGNOSIS — D631 Anemia in chronic kidney disease: Secondary | ICD-10-CM | POA: Diagnosis not present

## 2017-01-16 DIAGNOSIS — Z992 Dependence on renal dialysis: Secondary | ICD-10-CM | POA: Diagnosis not present

## 2017-01-16 DIAGNOSIS — N2581 Secondary hyperparathyroidism of renal origin: Secondary | ICD-10-CM | POA: Diagnosis not present

## 2017-01-18 DIAGNOSIS — N186 End stage renal disease: Secondary | ICD-10-CM | POA: Diagnosis not present

## 2017-01-18 DIAGNOSIS — D631 Anemia in chronic kidney disease: Secondary | ICD-10-CM | POA: Diagnosis not present

## 2017-01-18 DIAGNOSIS — Z992 Dependence on renal dialysis: Secondary | ICD-10-CM | POA: Diagnosis not present

## 2017-01-18 DIAGNOSIS — N2581 Secondary hyperparathyroidism of renal origin: Secondary | ICD-10-CM | POA: Diagnosis not present

## 2017-01-18 DIAGNOSIS — E1129 Type 2 diabetes mellitus with other diabetic kidney complication: Secondary | ICD-10-CM | POA: Diagnosis not present

## 2017-01-22 ENCOUNTER — Ambulatory Visit: Payer: Medicare Other | Admitting: Internal Medicine

## 2017-01-23 DIAGNOSIS — Z992 Dependence on renal dialysis: Secondary | ICD-10-CM | POA: Diagnosis not present

## 2017-01-23 DIAGNOSIS — D631 Anemia in chronic kidney disease: Secondary | ICD-10-CM | POA: Diagnosis not present

## 2017-01-23 DIAGNOSIS — E1129 Type 2 diabetes mellitus with other diabetic kidney complication: Secondary | ICD-10-CM | POA: Diagnosis not present

## 2017-01-23 DIAGNOSIS — N186 End stage renal disease: Secondary | ICD-10-CM | POA: Diagnosis not present

## 2017-01-23 DIAGNOSIS — N2581 Secondary hyperparathyroidism of renal origin: Secondary | ICD-10-CM | POA: Diagnosis not present

## 2017-01-25 DIAGNOSIS — D631 Anemia in chronic kidney disease: Secondary | ICD-10-CM | POA: Diagnosis not present

## 2017-01-25 DIAGNOSIS — N186 End stage renal disease: Secondary | ICD-10-CM | POA: Diagnosis not present

## 2017-01-25 DIAGNOSIS — E1129 Type 2 diabetes mellitus with other diabetic kidney complication: Secondary | ICD-10-CM | POA: Diagnosis not present

## 2017-01-25 DIAGNOSIS — N2581 Secondary hyperparathyroidism of renal origin: Secondary | ICD-10-CM | POA: Diagnosis not present

## 2017-01-25 DIAGNOSIS — Z992 Dependence on renal dialysis: Secondary | ICD-10-CM | POA: Diagnosis not present

## 2017-01-28 DIAGNOSIS — N2581 Secondary hyperparathyroidism of renal origin: Secondary | ICD-10-CM | POA: Diagnosis not present

## 2017-01-28 DIAGNOSIS — E1129 Type 2 diabetes mellitus with other diabetic kidney complication: Secondary | ICD-10-CM | POA: Diagnosis not present

## 2017-01-28 DIAGNOSIS — D631 Anemia in chronic kidney disease: Secondary | ICD-10-CM | POA: Diagnosis not present

## 2017-01-28 DIAGNOSIS — Z992 Dependence on renal dialysis: Secondary | ICD-10-CM | POA: Diagnosis not present

## 2017-01-28 DIAGNOSIS — N186 End stage renal disease: Secondary | ICD-10-CM | POA: Diagnosis not present

## 2017-01-29 DIAGNOSIS — G894 Chronic pain syndrome: Secondary | ICD-10-CM | POA: Diagnosis not present

## 2017-01-29 DIAGNOSIS — M792 Neuralgia and neuritis, unspecified: Secondary | ICD-10-CM | POA: Diagnosis not present

## 2017-01-29 DIAGNOSIS — G609 Hereditary and idiopathic neuropathy, unspecified: Secondary | ICD-10-CM | POA: Diagnosis not present

## 2017-01-29 DIAGNOSIS — F4542 Pain disorder with related psychological factors: Secondary | ICD-10-CM | POA: Diagnosis not present

## 2017-01-30 DIAGNOSIS — E1129 Type 2 diabetes mellitus with other diabetic kidney complication: Secondary | ICD-10-CM | POA: Diagnosis not present

## 2017-01-30 DIAGNOSIS — Z992 Dependence on renal dialysis: Secondary | ICD-10-CM | POA: Diagnosis not present

## 2017-01-30 DIAGNOSIS — N2581 Secondary hyperparathyroidism of renal origin: Secondary | ICD-10-CM | POA: Diagnosis not present

## 2017-01-30 DIAGNOSIS — D631 Anemia in chronic kidney disease: Secondary | ICD-10-CM | POA: Diagnosis not present

## 2017-01-30 DIAGNOSIS — N186 End stage renal disease: Secondary | ICD-10-CM | POA: Diagnosis not present

## 2017-02-01 DIAGNOSIS — N186 End stage renal disease: Secondary | ICD-10-CM | POA: Diagnosis not present

## 2017-02-01 DIAGNOSIS — D631 Anemia in chronic kidney disease: Secondary | ICD-10-CM | POA: Diagnosis not present

## 2017-02-01 DIAGNOSIS — E1129 Type 2 diabetes mellitus with other diabetic kidney complication: Secondary | ICD-10-CM | POA: Diagnosis not present

## 2017-02-01 DIAGNOSIS — Z992 Dependence on renal dialysis: Secondary | ICD-10-CM | POA: Diagnosis not present

## 2017-02-01 DIAGNOSIS — N2581 Secondary hyperparathyroidism of renal origin: Secondary | ICD-10-CM | POA: Diagnosis not present

## 2017-02-03 DIAGNOSIS — D631 Anemia in chronic kidney disease: Secondary | ICD-10-CM | POA: Diagnosis not present

## 2017-02-03 DIAGNOSIS — E1129 Type 2 diabetes mellitus with other diabetic kidney complication: Secondary | ICD-10-CM | POA: Diagnosis not present

## 2017-02-03 DIAGNOSIS — N2581 Secondary hyperparathyroidism of renal origin: Secondary | ICD-10-CM | POA: Diagnosis not present

## 2017-02-03 DIAGNOSIS — Z992 Dependence on renal dialysis: Secondary | ICD-10-CM | POA: Diagnosis not present

## 2017-02-03 DIAGNOSIS — N186 End stage renal disease: Secondary | ICD-10-CM | POA: Diagnosis not present

## 2017-02-06 DIAGNOSIS — E1351 Other specified diabetes mellitus with diabetic peripheral angiopathy without gangrene: Secondary | ICD-10-CM | POA: Diagnosis not present

## 2017-02-06 DIAGNOSIS — N2581 Secondary hyperparathyroidism of renal origin: Secondary | ICD-10-CM | POA: Diagnosis not present

## 2017-02-06 DIAGNOSIS — L602 Onychogryphosis: Secondary | ICD-10-CM | POA: Diagnosis not present

## 2017-02-06 DIAGNOSIS — E1129 Type 2 diabetes mellitus with other diabetic kidney complication: Secondary | ICD-10-CM | POA: Diagnosis not present

## 2017-02-06 DIAGNOSIS — D631 Anemia in chronic kidney disease: Secondary | ICD-10-CM | POA: Diagnosis not present

## 2017-02-06 DIAGNOSIS — N186 End stage renal disease: Secondary | ICD-10-CM | POA: Diagnosis not present

## 2017-02-06 DIAGNOSIS — Z992 Dependence on renal dialysis: Secondary | ICD-10-CM | POA: Diagnosis not present

## 2017-02-08 DIAGNOSIS — N186 End stage renal disease: Secondary | ICD-10-CM | POA: Diagnosis not present

## 2017-02-08 DIAGNOSIS — E1129 Type 2 diabetes mellitus with other diabetic kidney complication: Secondary | ICD-10-CM | POA: Diagnosis not present

## 2017-02-08 DIAGNOSIS — D631 Anemia in chronic kidney disease: Secondary | ICD-10-CM | POA: Diagnosis not present

## 2017-02-08 DIAGNOSIS — Z992 Dependence on renal dialysis: Secondary | ICD-10-CM | POA: Diagnosis not present

## 2017-02-08 DIAGNOSIS — N2581 Secondary hyperparathyroidism of renal origin: Secondary | ICD-10-CM | POA: Diagnosis not present

## 2017-02-10 DIAGNOSIS — D631 Anemia in chronic kidney disease: Secondary | ICD-10-CM | POA: Diagnosis not present

## 2017-02-10 DIAGNOSIS — Z992 Dependence on renal dialysis: Secondary | ICD-10-CM | POA: Diagnosis not present

## 2017-02-10 DIAGNOSIS — N2581 Secondary hyperparathyroidism of renal origin: Secondary | ICD-10-CM | POA: Diagnosis not present

## 2017-02-10 DIAGNOSIS — E1129 Type 2 diabetes mellitus with other diabetic kidney complication: Secondary | ICD-10-CM | POA: Diagnosis not present

## 2017-02-10 DIAGNOSIS — N186 End stage renal disease: Secondary | ICD-10-CM | POA: Diagnosis not present

## 2017-02-11 DIAGNOSIS — Z992 Dependence on renal dialysis: Secondary | ICD-10-CM | POA: Diagnosis not present

## 2017-02-11 DIAGNOSIS — I120 Hypertensive chronic kidney disease with stage 5 chronic kidney disease or end stage renal disease: Secondary | ICD-10-CM | POA: Diagnosis not present

## 2017-02-11 DIAGNOSIS — N186 End stage renal disease: Secondary | ICD-10-CM | POA: Diagnosis not present

## 2017-02-13 DIAGNOSIS — N186 End stage renal disease: Secondary | ICD-10-CM | POA: Diagnosis not present

## 2017-02-13 DIAGNOSIS — D631 Anemia in chronic kidney disease: Secondary | ICD-10-CM | POA: Diagnosis not present

## 2017-02-13 DIAGNOSIS — Z992 Dependence on renal dialysis: Secondary | ICD-10-CM | POA: Diagnosis not present

## 2017-02-13 DIAGNOSIS — E1129 Type 2 diabetes mellitus with other diabetic kidney complication: Secondary | ICD-10-CM | POA: Diagnosis not present

## 2017-02-13 DIAGNOSIS — N2581 Secondary hyperparathyroidism of renal origin: Secondary | ICD-10-CM | POA: Diagnosis not present

## 2017-02-18 DIAGNOSIS — N2581 Secondary hyperparathyroidism of renal origin: Secondary | ICD-10-CM | POA: Diagnosis not present

## 2017-02-18 DIAGNOSIS — N186 End stage renal disease: Secondary | ICD-10-CM | POA: Diagnosis not present

## 2017-02-18 DIAGNOSIS — Z992 Dependence on renal dialysis: Secondary | ICD-10-CM | POA: Diagnosis not present

## 2017-02-18 DIAGNOSIS — D631 Anemia in chronic kidney disease: Secondary | ICD-10-CM | POA: Diagnosis not present

## 2017-02-18 DIAGNOSIS — E1129 Type 2 diabetes mellitus with other diabetic kidney complication: Secondary | ICD-10-CM | POA: Diagnosis not present

## 2017-02-20 DIAGNOSIS — N186 End stage renal disease: Secondary | ICD-10-CM | POA: Diagnosis not present

## 2017-02-20 DIAGNOSIS — M47817 Spondylosis without myelopathy or radiculopathy, lumbosacral region: Secondary | ICD-10-CM | POA: Diagnosis not present

## 2017-02-20 DIAGNOSIS — Z992 Dependence on renal dialysis: Secondary | ICD-10-CM | POA: Diagnosis not present

## 2017-02-20 DIAGNOSIS — D631 Anemia in chronic kidney disease: Secondary | ICD-10-CM | POA: Diagnosis not present

## 2017-02-20 DIAGNOSIS — N2581 Secondary hyperparathyroidism of renal origin: Secondary | ICD-10-CM | POA: Diagnosis not present

## 2017-02-20 DIAGNOSIS — E1129 Type 2 diabetes mellitus with other diabetic kidney complication: Secondary | ICD-10-CM | POA: Diagnosis not present

## 2017-02-22 DIAGNOSIS — Z992 Dependence on renal dialysis: Secondary | ICD-10-CM | POA: Diagnosis not present

## 2017-02-22 DIAGNOSIS — E1129 Type 2 diabetes mellitus with other diabetic kidney complication: Secondary | ICD-10-CM | POA: Diagnosis not present

## 2017-02-22 DIAGNOSIS — D631 Anemia in chronic kidney disease: Secondary | ICD-10-CM | POA: Diagnosis not present

## 2017-02-22 DIAGNOSIS — N186 End stage renal disease: Secondary | ICD-10-CM | POA: Diagnosis not present

## 2017-02-22 DIAGNOSIS — N2581 Secondary hyperparathyroidism of renal origin: Secondary | ICD-10-CM | POA: Diagnosis not present

## 2017-02-25 DIAGNOSIS — D631 Anemia in chronic kidney disease: Secondary | ICD-10-CM | POA: Diagnosis not present

## 2017-02-25 DIAGNOSIS — E1129 Type 2 diabetes mellitus with other diabetic kidney complication: Secondary | ICD-10-CM | POA: Diagnosis not present

## 2017-02-25 DIAGNOSIS — Z992 Dependence on renal dialysis: Secondary | ICD-10-CM | POA: Diagnosis not present

## 2017-02-25 DIAGNOSIS — N2581 Secondary hyperparathyroidism of renal origin: Secondary | ICD-10-CM | POA: Diagnosis not present

## 2017-02-25 DIAGNOSIS — N186 End stage renal disease: Secondary | ICD-10-CM | POA: Diagnosis not present

## 2017-02-27 DIAGNOSIS — N186 End stage renal disease: Secondary | ICD-10-CM | POA: Diagnosis not present

## 2017-02-27 DIAGNOSIS — N2581 Secondary hyperparathyroidism of renal origin: Secondary | ICD-10-CM | POA: Diagnosis not present

## 2017-02-27 DIAGNOSIS — Z992 Dependence on renal dialysis: Secondary | ICD-10-CM | POA: Diagnosis not present

## 2017-02-27 DIAGNOSIS — D631 Anemia in chronic kidney disease: Secondary | ICD-10-CM | POA: Diagnosis not present

## 2017-02-27 DIAGNOSIS — E1129 Type 2 diabetes mellitus with other diabetic kidney complication: Secondary | ICD-10-CM | POA: Diagnosis not present

## 2017-03-01 DIAGNOSIS — E1129 Type 2 diabetes mellitus with other diabetic kidney complication: Secondary | ICD-10-CM | POA: Diagnosis not present

## 2017-03-01 DIAGNOSIS — N186 End stage renal disease: Secondary | ICD-10-CM | POA: Diagnosis not present

## 2017-03-01 DIAGNOSIS — D631 Anemia in chronic kidney disease: Secondary | ICD-10-CM | POA: Diagnosis not present

## 2017-03-01 DIAGNOSIS — N2581 Secondary hyperparathyroidism of renal origin: Secondary | ICD-10-CM | POA: Diagnosis not present

## 2017-03-01 DIAGNOSIS — Z992 Dependence on renal dialysis: Secondary | ICD-10-CM | POA: Diagnosis not present

## 2017-03-04 DIAGNOSIS — E1129 Type 2 diabetes mellitus with other diabetic kidney complication: Secondary | ICD-10-CM | POA: Diagnosis not present

## 2017-03-04 DIAGNOSIS — D631 Anemia in chronic kidney disease: Secondary | ICD-10-CM | POA: Diagnosis not present

## 2017-03-04 DIAGNOSIS — N2581 Secondary hyperparathyroidism of renal origin: Secondary | ICD-10-CM | POA: Diagnosis not present

## 2017-03-04 DIAGNOSIS — N186 End stage renal disease: Secondary | ICD-10-CM | POA: Diagnosis not present

## 2017-03-04 DIAGNOSIS — Z992 Dependence on renal dialysis: Secondary | ICD-10-CM | POA: Diagnosis not present

## 2017-03-08 DIAGNOSIS — D631 Anemia in chronic kidney disease: Secondary | ICD-10-CM | POA: Diagnosis not present

## 2017-03-08 DIAGNOSIS — Z992 Dependence on renal dialysis: Secondary | ICD-10-CM | POA: Diagnosis not present

## 2017-03-08 DIAGNOSIS — N2581 Secondary hyperparathyroidism of renal origin: Secondary | ICD-10-CM | POA: Diagnosis not present

## 2017-03-08 DIAGNOSIS — N186 End stage renal disease: Secondary | ICD-10-CM | POA: Diagnosis not present

## 2017-03-08 DIAGNOSIS — E1129 Type 2 diabetes mellitus with other diabetic kidney complication: Secondary | ICD-10-CM | POA: Diagnosis not present

## 2017-03-11 DIAGNOSIS — Z992 Dependence on renal dialysis: Secondary | ICD-10-CM | POA: Diagnosis not present

## 2017-03-11 DIAGNOSIS — E1129 Type 2 diabetes mellitus with other diabetic kidney complication: Secondary | ICD-10-CM | POA: Diagnosis not present

## 2017-03-11 DIAGNOSIS — D631 Anemia in chronic kidney disease: Secondary | ICD-10-CM | POA: Diagnosis not present

## 2017-03-11 DIAGNOSIS — N2581 Secondary hyperparathyroidism of renal origin: Secondary | ICD-10-CM | POA: Diagnosis not present

## 2017-03-11 DIAGNOSIS — N186 End stage renal disease: Secondary | ICD-10-CM | POA: Diagnosis not present

## 2017-03-13 DIAGNOSIS — N2581 Secondary hyperparathyroidism of renal origin: Secondary | ICD-10-CM | POA: Diagnosis not present

## 2017-03-13 DIAGNOSIS — Z992 Dependence on renal dialysis: Secondary | ICD-10-CM | POA: Diagnosis not present

## 2017-03-13 DIAGNOSIS — E1129 Type 2 diabetes mellitus with other diabetic kidney complication: Secondary | ICD-10-CM | POA: Diagnosis not present

## 2017-03-13 DIAGNOSIS — N186 End stage renal disease: Secondary | ICD-10-CM | POA: Diagnosis not present

## 2017-03-13 DIAGNOSIS — D631 Anemia in chronic kidney disease: Secondary | ICD-10-CM | POA: Diagnosis not present

## 2017-03-14 DIAGNOSIS — N186 End stage renal disease: Secondary | ICD-10-CM | POA: Diagnosis not present

## 2017-03-14 DIAGNOSIS — Z992 Dependence on renal dialysis: Secondary | ICD-10-CM | POA: Diagnosis not present

## 2017-03-14 DIAGNOSIS — I120 Hypertensive chronic kidney disease with stage 5 chronic kidney disease or end stage renal disease: Secondary | ICD-10-CM | POA: Diagnosis not present

## 2017-03-15 DIAGNOSIS — Z23 Encounter for immunization: Secondary | ICD-10-CM | POA: Diagnosis not present

## 2017-03-15 DIAGNOSIS — E875 Hyperkalemia: Secondary | ICD-10-CM | POA: Diagnosis not present

## 2017-03-15 DIAGNOSIS — D631 Anemia in chronic kidney disease: Secondary | ICD-10-CM | POA: Diagnosis not present

## 2017-03-15 DIAGNOSIS — N186 End stage renal disease: Secondary | ICD-10-CM | POA: Diagnosis not present

## 2017-03-15 DIAGNOSIS — E1129 Type 2 diabetes mellitus with other diabetic kidney complication: Secondary | ICD-10-CM | POA: Diagnosis not present

## 2017-03-15 DIAGNOSIS — D509 Iron deficiency anemia, unspecified: Secondary | ICD-10-CM | POA: Diagnosis not present

## 2017-03-15 DIAGNOSIS — I120 Hypertensive chronic kidney disease with stage 5 chronic kidney disease or end stage renal disease: Secondary | ICD-10-CM | POA: Diagnosis not present

## 2017-03-15 DIAGNOSIS — N2581 Secondary hyperparathyroidism of renal origin: Secondary | ICD-10-CM | POA: Diagnosis not present

## 2017-03-15 DIAGNOSIS — Z992 Dependence on renal dialysis: Secondary | ICD-10-CM | POA: Diagnosis not present

## 2017-03-18 DIAGNOSIS — D631 Anemia in chronic kidney disease: Secondary | ICD-10-CM | POA: Diagnosis not present

## 2017-03-18 DIAGNOSIS — N186 End stage renal disease: Secondary | ICD-10-CM | POA: Diagnosis not present

## 2017-03-18 DIAGNOSIS — Z992 Dependence on renal dialysis: Secondary | ICD-10-CM | POA: Diagnosis not present

## 2017-03-18 DIAGNOSIS — N2581 Secondary hyperparathyroidism of renal origin: Secondary | ICD-10-CM | POA: Diagnosis not present

## 2017-03-18 DIAGNOSIS — E1129 Type 2 diabetes mellitus with other diabetic kidney complication: Secondary | ICD-10-CM | POA: Diagnosis not present

## 2017-03-18 DIAGNOSIS — D509 Iron deficiency anemia, unspecified: Secondary | ICD-10-CM | POA: Diagnosis not present

## 2017-03-20 ENCOUNTER — Encounter (HOSPITAL_BASED_OUTPATIENT_CLINIC_OR_DEPARTMENT_OTHER): Payer: Medicare Other | Attending: Physician Assistant

## 2017-03-20 DIAGNOSIS — Z7901 Long term (current) use of anticoagulants: Secondary | ICD-10-CM | POA: Insufficient documentation

## 2017-03-20 DIAGNOSIS — D631 Anemia in chronic kidney disease: Secondary | ICD-10-CM | POA: Diagnosis not present

## 2017-03-20 DIAGNOSIS — L97222 Non-pressure chronic ulcer of left calf with fat layer exposed: Secondary | ICD-10-CM | POA: Insufficient documentation

## 2017-03-20 DIAGNOSIS — E1122 Type 2 diabetes mellitus with diabetic chronic kidney disease: Secondary | ICD-10-CM | POA: Insufficient documentation

## 2017-03-20 DIAGNOSIS — Z992 Dependence on renal dialysis: Secondary | ICD-10-CM | POA: Diagnosis not present

## 2017-03-20 DIAGNOSIS — E1129 Type 2 diabetes mellitus with other diabetic kidney complication: Secondary | ICD-10-CM | POA: Diagnosis not present

## 2017-03-20 DIAGNOSIS — D509 Iron deficiency anemia, unspecified: Secondary | ICD-10-CM | POA: Diagnosis not present

## 2017-03-20 DIAGNOSIS — I132 Hypertensive heart and chronic kidney disease with heart failure and with stage 5 chronic kidney disease, or end stage renal disease: Secondary | ICD-10-CM | POA: Diagnosis not present

## 2017-03-20 DIAGNOSIS — I872 Venous insufficiency (chronic) (peripheral): Secondary | ICD-10-CM | POA: Diagnosis not present

## 2017-03-20 DIAGNOSIS — I4891 Unspecified atrial fibrillation: Secondary | ICD-10-CM | POA: Insufficient documentation

## 2017-03-20 DIAGNOSIS — I509 Heart failure, unspecified: Secondary | ICD-10-CM | POA: Insufficient documentation

## 2017-03-20 DIAGNOSIS — E11622 Type 2 diabetes mellitus with other skin ulcer: Secondary | ICD-10-CM | POA: Diagnosis not present

## 2017-03-20 DIAGNOSIS — N186 End stage renal disease: Secondary | ICD-10-CM | POA: Insufficient documentation

## 2017-03-20 DIAGNOSIS — N2581 Secondary hyperparathyroidism of renal origin: Secondary | ICD-10-CM | POA: Diagnosis not present

## 2017-03-20 DIAGNOSIS — E119 Type 2 diabetes mellitus without complications: Secondary | ICD-10-CM | POA: Diagnosis not present

## 2017-03-22 DIAGNOSIS — E1129 Type 2 diabetes mellitus with other diabetic kidney complication: Secondary | ICD-10-CM | POA: Diagnosis not present

## 2017-03-22 DIAGNOSIS — D631 Anemia in chronic kidney disease: Secondary | ICD-10-CM | POA: Diagnosis not present

## 2017-03-22 DIAGNOSIS — D509 Iron deficiency anemia, unspecified: Secondary | ICD-10-CM | POA: Diagnosis not present

## 2017-03-22 DIAGNOSIS — N2581 Secondary hyperparathyroidism of renal origin: Secondary | ICD-10-CM | POA: Diagnosis not present

## 2017-03-22 DIAGNOSIS — N186 End stage renal disease: Secondary | ICD-10-CM | POA: Diagnosis not present

## 2017-03-22 DIAGNOSIS — Z992 Dependence on renal dialysis: Secondary | ICD-10-CM | POA: Diagnosis not present

## 2017-03-25 DIAGNOSIS — M75101 Unspecified rotator cuff tear or rupture of right shoulder, not specified as traumatic: Secondary | ICD-10-CM | POA: Diagnosis not present

## 2017-03-25 DIAGNOSIS — Z992 Dependence on renal dialysis: Secondary | ICD-10-CM | POA: Diagnosis not present

## 2017-03-25 DIAGNOSIS — E1129 Type 2 diabetes mellitus with other diabetic kidney complication: Secondary | ICD-10-CM | POA: Diagnosis not present

## 2017-03-25 DIAGNOSIS — N2581 Secondary hyperparathyroidism of renal origin: Secondary | ICD-10-CM | POA: Diagnosis not present

## 2017-03-25 DIAGNOSIS — G894 Chronic pain syndrome: Secondary | ICD-10-CM | POA: Diagnosis not present

## 2017-03-25 DIAGNOSIS — M19011 Primary osteoarthritis, right shoulder: Secondary | ICD-10-CM | POA: Diagnosis not present

## 2017-03-25 DIAGNOSIS — M1711 Unilateral primary osteoarthritis, right knee: Secondary | ICD-10-CM | POA: Diagnosis not present

## 2017-03-25 DIAGNOSIS — D509 Iron deficiency anemia, unspecified: Secondary | ICD-10-CM | POA: Diagnosis not present

## 2017-03-25 DIAGNOSIS — N186 End stage renal disease: Secondary | ICD-10-CM | POA: Diagnosis not present

## 2017-03-25 DIAGNOSIS — D631 Anemia in chronic kidney disease: Secondary | ICD-10-CM | POA: Diagnosis not present

## 2017-03-27 DIAGNOSIS — L97222 Non-pressure chronic ulcer of left calf with fat layer exposed: Secondary | ICD-10-CM | POA: Diagnosis not present

## 2017-03-27 DIAGNOSIS — I872 Venous insufficiency (chronic) (peripheral): Secondary | ICD-10-CM | POA: Diagnosis not present

## 2017-03-27 DIAGNOSIS — E11622 Type 2 diabetes mellitus with other skin ulcer: Secondary | ICD-10-CM | POA: Diagnosis not present

## 2017-03-27 DIAGNOSIS — E1122 Type 2 diabetes mellitus with diabetic chronic kidney disease: Secondary | ICD-10-CM | POA: Diagnosis not present

## 2017-03-27 DIAGNOSIS — E1129 Type 2 diabetes mellitus with other diabetic kidney complication: Secondary | ICD-10-CM | POA: Diagnosis not present

## 2017-03-27 DIAGNOSIS — N2581 Secondary hyperparathyroidism of renal origin: Secondary | ICD-10-CM | POA: Diagnosis not present

## 2017-03-27 DIAGNOSIS — D509 Iron deficiency anemia, unspecified: Secondary | ICD-10-CM | POA: Diagnosis not present

## 2017-03-27 DIAGNOSIS — Z992 Dependence on renal dialysis: Secondary | ICD-10-CM | POA: Diagnosis not present

## 2017-03-27 DIAGNOSIS — I132 Hypertensive heart and chronic kidney disease with heart failure and with stage 5 chronic kidney disease, or end stage renal disease: Secondary | ICD-10-CM | POA: Diagnosis not present

## 2017-03-27 DIAGNOSIS — N186 End stage renal disease: Secondary | ICD-10-CM | POA: Diagnosis not present

## 2017-03-27 DIAGNOSIS — D631 Anemia in chronic kidney disease: Secondary | ICD-10-CM | POA: Diagnosis not present

## 2017-03-29 DIAGNOSIS — Z992 Dependence on renal dialysis: Secondary | ICD-10-CM | POA: Diagnosis not present

## 2017-03-29 DIAGNOSIS — D631 Anemia in chronic kidney disease: Secondary | ICD-10-CM | POA: Diagnosis not present

## 2017-03-29 DIAGNOSIS — N186 End stage renal disease: Secondary | ICD-10-CM | POA: Diagnosis not present

## 2017-03-29 DIAGNOSIS — N2581 Secondary hyperparathyroidism of renal origin: Secondary | ICD-10-CM | POA: Diagnosis not present

## 2017-03-29 DIAGNOSIS — D509 Iron deficiency anemia, unspecified: Secondary | ICD-10-CM | POA: Diagnosis not present

## 2017-03-29 DIAGNOSIS — E1129 Type 2 diabetes mellitus with other diabetic kidney complication: Secondary | ICD-10-CM | POA: Diagnosis not present

## 2017-04-01 DIAGNOSIS — Z992 Dependence on renal dialysis: Secondary | ICD-10-CM | POA: Diagnosis not present

## 2017-04-01 DIAGNOSIS — D509 Iron deficiency anemia, unspecified: Secondary | ICD-10-CM | POA: Diagnosis not present

## 2017-04-01 DIAGNOSIS — E1129 Type 2 diabetes mellitus with other diabetic kidney complication: Secondary | ICD-10-CM | POA: Diagnosis not present

## 2017-04-01 DIAGNOSIS — N186 End stage renal disease: Secondary | ICD-10-CM | POA: Diagnosis not present

## 2017-04-01 DIAGNOSIS — D631 Anemia in chronic kidney disease: Secondary | ICD-10-CM | POA: Diagnosis not present

## 2017-04-01 DIAGNOSIS — N2581 Secondary hyperparathyroidism of renal origin: Secondary | ICD-10-CM | POA: Diagnosis not present

## 2017-04-03 ENCOUNTER — Observation Stay (HOSPITAL_COMMUNITY)
Admission: EM | Admit: 2017-04-03 | Discharge: 2017-04-04 | Disposition: A | Discharge status: Home / Self Care | Payer: Medicare Other | Attending: Family Medicine | Admitting: Family Medicine

## 2017-04-03 ENCOUNTER — Emergency Department (HOSPITAL_COMMUNITY): Payer: Medicare Other

## 2017-04-03 ENCOUNTER — Encounter (HOSPITAL_COMMUNITY): Payer: Self-pay | Admitting: *Deleted

## 2017-04-03 DIAGNOSIS — Z79899 Other long term (current) drug therapy: Secondary | ICD-10-CM | POA: Diagnosis not present

## 2017-04-03 DIAGNOSIS — R4020 Unspecified coma: Secondary | ICD-10-CM | POA: Diagnosis not present

## 2017-04-03 DIAGNOSIS — E039 Hypothyroidism, unspecified: Secondary | ICD-10-CM | POA: Diagnosis not present

## 2017-04-03 DIAGNOSIS — Z992 Dependence on renal dialysis: Secondary | ICD-10-CM | POA: Diagnosis not present

## 2017-04-03 DIAGNOSIS — I482 Chronic atrial fibrillation, unspecified: Secondary | ICD-10-CM | POA: Diagnosis present

## 2017-04-03 DIAGNOSIS — Z8673 Personal history of transient ischemic attack (TIA), and cerebral infarction without residual deficits: Secondary | ICD-10-CM

## 2017-04-03 DIAGNOSIS — R55 Syncope and collapse: Secondary | ICD-10-CM | POA: Diagnosis present

## 2017-04-03 DIAGNOSIS — I5043 Acute on chronic combined systolic (congestive) and diastolic (congestive) heart failure: Secondary | ICD-10-CM | POA: Diagnosis not present

## 2017-04-03 DIAGNOSIS — E11319 Type 2 diabetes mellitus with unspecified diabetic retinopathy without macular edema: Secondary | ICD-10-CM | POA: Insufficient documentation

## 2017-04-03 DIAGNOSIS — D509 Iron deficiency anemia, unspecified: Secondary | ICD-10-CM | POA: Diagnosis not present

## 2017-04-03 DIAGNOSIS — Z531 Procedure and treatment not carried out because of patient's decision for reasons of belief and group pressure: Secondary | ICD-10-CM

## 2017-04-03 DIAGNOSIS — Z8679 Personal history of other diseases of the circulatory system: Secondary | ICD-10-CM

## 2017-04-03 DIAGNOSIS — R7303 Prediabetes: Secondary | ICD-10-CM | POA: Diagnosis present

## 2017-04-03 DIAGNOSIS — N186 End stage renal disease: Secondary | ICD-10-CM | POA: Insufficient documentation

## 2017-04-03 DIAGNOSIS — E1340 Other specified diabetes mellitus with diabetic neuropathy, unspecified: Secondary | ICD-10-CM

## 2017-04-03 DIAGNOSIS — D638 Anemia in other chronic diseases classified elsewhere: Secondary | ICD-10-CM | POA: Diagnosis present

## 2017-04-03 DIAGNOSIS — E1129 Type 2 diabetes mellitus with other diabetic kidney complication: Secondary | ICD-10-CM | POA: Diagnosis not present

## 2017-04-03 DIAGNOSIS — Z96653 Presence of artificial knee joint, bilateral: Secondary | ICD-10-CM | POA: Insufficient documentation

## 2017-04-03 DIAGNOSIS — R4182 Altered mental status, unspecified: Secondary | ICD-10-CM | POA: Diagnosis not present

## 2017-04-03 DIAGNOSIS — N2581 Secondary hyperparathyroidism of renal origin: Secondary | ICD-10-CM | POA: Diagnosis not present

## 2017-04-03 DIAGNOSIS — Z96641 Presence of right artificial hip joint: Secondary | ICD-10-CM | POA: Insufficient documentation

## 2017-04-03 DIAGNOSIS — R000 Tachycardia, unspecified: Secondary | ICD-10-CM | POA: Diagnosis not present

## 2017-04-03 DIAGNOSIS — D631 Anemia in chronic kidney disease: Secondary | ICD-10-CM | POA: Diagnosis not present

## 2017-04-03 DIAGNOSIS — I132 Hypertensive heart and chronic kidney disease with heart failure and with stage 5 chronic kidney disease, or end stage renal disease: Secondary | ICD-10-CM | POA: Diagnosis not present

## 2017-04-03 DIAGNOSIS — I4891 Unspecified atrial fibrillation: Secondary | ICD-10-CM | POA: Diagnosis not present

## 2017-04-03 DIAGNOSIS — L97929 Non-pressure chronic ulcer of unspecified part of left lower leg with unspecified severity: Secondary | ICD-10-CM | POA: Diagnosis present

## 2017-04-03 DIAGNOSIS — IMO0001 Reserved for inherently not codable concepts without codable children: Secondary | ICD-10-CM

## 2017-04-03 HISTORY — DX: Traumatic subdural hemorrhage with loss of consciousness status unknown, initial encounter: S06.5XAA

## 2017-04-03 HISTORY — DX: Traumatic subdural hemorrhage with loss of consciousness of unspecified duration, initial encounter: S06.5X9A

## 2017-04-03 LAB — CBC WITH DIFFERENTIAL/PLATELET
Basophils Absolute: 0.1 10*3/uL (ref 0.0–0.1)
Basophils Relative: 1 %
EOS PCT: 6 %
Eosinophils Absolute: 0.3 10*3/uL (ref 0.0–0.7)
HCT: 35.4 % — ABNORMAL LOW (ref 39.0–52.0)
HEMOGLOBIN: 10.9 g/dL — AB (ref 13.0–17.0)
LYMPHS ABS: 1.5 10*3/uL (ref 0.7–4.0)
LYMPHS PCT: 28 %
MCH: 31.1 pg (ref 26.0–34.0)
MCHC: 30.8 g/dL (ref 30.0–36.0)
MCV: 100.9 fL — AB (ref 78.0–100.0)
Monocytes Absolute: 0.4 10*3/uL (ref 0.1–1.0)
Monocytes Relative: 7 %
NEUTROS ABS: 3 10*3/uL (ref 1.7–7.7)
NEUTROS PCT: 58 %
PLATELETS: 188 10*3/uL (ref 150–400)
RBC: 3.51 MIL/uL — AB (ref 4.22–5.81)
RDW: 15.1 % (ref 11.5–15.5)
WBC: 5.2 10*3/uL (ref 4.0–10.5)

## 2017-04-03 LAB — PROTIME-INR
INR: 1.05
Prothrombin Time: 13.6 seconds (ref 11.4–15.2)

## 2017-04-03 LAB — I-STAT CHEM 8, ED
BUN: 10 mg/dL (ref 6–20)
CALCIUM ION: 1.26 mmol/L (ref 1.15–1.40)
CREATININE: 4.6 mg/dL — AB (ref 0.61–1.24)
Chloride: 98 mmol/L — ABNORMAL LOW (ref 101–111)
Glucose, Bld: 93 mg/dL (ref 65–99)
HCT: 37 % — ABNORMAL LOW (ref 39.0–52.0)
HEMOGLOBIN: 12.6 g/dL — AB (ref 13.0–17.0)
Potassium: 3.9 mmol/L (ref 3.5–5.1)
SODIUM: 138 mmol/L (ref 135–145)
TCO2: 30 mmol/L (ref 22–32)

## 2017-04-03 LAB — COMPREHENSIVE METABOLIC PANEL
ALT: 10 U/L — AB (ref 17–63)
AST: 20 U/L (ref 15–41)
Albumin: 3.2 g/dL — ABNORMAL LOW (ref 3.5–5.0)
Alkaline Phosphatase: 141 U/L — ABNORMAL HIGH (ref 38–126)
Anion gap: 10 (ref 5–15)
BUN: 9 mg/dL (ref 6–20)
CHLORIDE: 99 mmol/L — AB (ref 101–111)
CO2: 28 mmol/L (ref 22–32)
CREATININE: 4.83 mg/dL — AB (ref 0.61–1.24)
Calcium: 9.8 mg/dL (ref 8.9–10.3)
GFR calc Af Amer: 14 mL/min — ABNORMAL LOW (ref >60)
GFR, EST NON AFRICAN AMERICAN: 12 mL/min — AB (ref >60)
Glucose, Bld: 99 mg/dL (ref 65–99)
Potassium: 3.9 mmol/L (ref 3.5–5.1)
Sodium: 137 mmol/L (ref 135–145)
Total Bilirubin: 0.5 mg/dL (ref 0.3–1.2)
Total Protein: 7.1 g/dL (ref 6.5–8.1)

## 2017-04-03 LAB — I-STAT TROPONIN, ED: Troponin i, poc: 0 ng/mL (ref 0.00–0.08)

## 2017-04-03 LAB — TROPONIN I: Troponin I: <0.03 ng/mL (ref <0.03)

## 2017-04-03 MED ORDER — VITAMIN B-12 1000 MCG PO TABS: 5000.0000 ug | ORAL_TABLET | ORAL | Status: DC

## 2017-04-03 MED ORDER — ACETAMINOPHEN 650 MG RE SUPP: 650.0000 mg | Freq: Four times a day (QID) | RECTAL | Status: DC | PRN

## 2017-04-03 MED ORDER — DOCUSATE SODIUM 100 MG PO CAPS: 100.0000 mg | ORAL_CAPSULE | Freq: Every day | ORAL | Status: DC | PRN

## 2017-04-03 MED ORDER — ACETAMINOPHEN 325 MG PO TABS: 650.0000 mg | ORAL_TABLET | Freq: Four times a day (QID) | ORAL | Status: DC | PRN

## 2017-04-03 MED ORDER — SODIUM CHLORIDE 0.9% FLUSH
3.0000 mL | Freq: Two times a day (BID) | INTRAVENOUS | Status: DC
  Administered 2017-04-03 – 2017-04-04 (×2): 3 mL via INTRAVENOUS

## 2017-04-03 MED ORDER — ONDANSETRON HCL 4 MG PO TABS
4.0000 mg | ORAL_TABLET | Freq: Four times a day (QID) | ORAL | Status: DC | PRN
Start: 2017-04-03 — End: 2017-04-05

## 2017-04-03 MED ORDER — AMITRIPTYLINE HCL 25 MG PO TABS
50.0000 mg | ORAL_TABLET | Freq: Every day | ORAL | Status: DC
  Administered 2017-04-03: 50 mg via ORAL
  Filled 2017-04-03: qty 2

## 2017-04-03 MED ORDER — ALLOPURINOL 100 MG PO TABS
100.0000 mg | ORAL_TABLET | Freq: Two times a day (BID) | ORAL | Status: DC
  Administered 2017-04-03 – 2017-04-04 (×2): 100 mg via ORAL
  Filled 2017-04-03 (×2): qty 1

## 2017-04-03 MED ORDER — SODIUM CHLORIDE 0.9 % IV BOLUS (SEPSIS)
500.0000 mL | Freq: Once | INTRAVENOUS | Status: AC
  Administered 2017-04-03: 500 mL via INTRAVENOUS

## 2017-04-03 MED ORDER — CARVEDILOL 12.5 MG PO TABS
12.5000 mg | ORAL_TABLET | ORAL | Status: DC
  Administered 2017-04-03: 12.5 mg via ORAL
  Filled 2017-04-03: qty 1

## 2017-04-03 MED ORDER — TRAMADOL HCL 50 MG PO TABS: 50.0000 mg | ORAL_TABLET | Freq: Four times a day (QID) | ORAL | Status: DC | PRN

## 2017-04-03 MED ORDER — GABAPENTIN 100 MG PO CAPS
200.0000 mg | ORAL_CAPSULE | Freq: Every day | ORAL | Status: DC
  Administered 2017-04-03: 200 mg via ORAL
  Filled 2017-04-03: qty 2

## 2017-04-03 MED ORDER — SIMVASTATIN 20 MG PO TABS
20.0000 mg | ORAL_TABLET | Freq: Every day | ORAL | Status: DC
  Administered 2017-04-03: 20 mg via ORAL
  Filled 2017-04-03: qty 1

## 2017-04-03 MED ORDER — CARVEDILOL 25 MG PO TABS
25.0000 mg | ORAL_TABLET | ORAL | Status: DC
  Administered 2017-04-04: 25 mg via ORAL
  Filled 2017-04-03: qty 1

## 2017-04-03 MED ORDER — ONDANSETRON HCL 4 MG/2ML IJ SOLN: 4.0000 mg | Freq: Four times a day (QID) | INTRAMUSCULAR | Status: DC | PRN

## 2017-04-03 MED ORDER — FERRIC CITRATE 1 GM 210 MG(FE) PO TABS
210.0000 mg | ORAL_TABLET | Freq: Three times a day (TID) | ORAL | Status: DC
  Administered 2017-04-03 – 2017-04-04 (×2): 210 mg via ORAL
  Filled 2017-04-03 (×6): qty 1

## 2017-04-03 MED ORDER — GABAPENTIN 100 MG PO CAPS
100.0000 mg | ORAL_CAPSULE | Freq: Every day | ORAL | Status: DC
  Administered 2017-04-04: 100 mg via ORAL
  Filled 2017-04-03: qty 1

## 2017-04-03 MED ORDER — VITAMIN B-12 5000 MCG SL SUBL: 1.0000 | SUBLINGUAL_TABLET | SUBLINGUAL | Status: DC

## 2017-04-03 MED ORDER — RENA-VITE PO TABS
1.0000 | ORAL_TABLET | Freq: Every day | ORAL | Status: DC
  Administered 2017-04-03: 1 via ORAL
  Filled 2017-04-03: qty 1

## 2017-04-03 NOTE — H&P (Addendum)
History and Physical    Daniel Whitney UXL:[id number] DOB: 03/01/1955 DOA: 04/03/2017  **Will place patient in observation status based on the expectation that the patient will need hospitalization/ hospital care that should not exceed greater than 24 hours duration  PCP: [name], MD   Attending physician: [name]  Patient coming from/Resides with: Private residence/brother  Chief Complaint: Unresponsive post dialysis  HPI: Daniel Whitney is a 62 y.o. male with medical history significant for CKD on dialysis, anemia of chronic kidney disease, hypothyroidism, history of CVA, history of MI, GERD, prediabetes, history of subdural hematoma October 2018.  Of note patient is [name] and refuses blood products.  Today after full dialysis treatment staff noticed patient was unresponsive.  AED was applied and no shock advised.  Staff completed 20 chest compressions while patient was seated in recliner.  Patient subsequently awakened and told staff to get off his chest.  He denied pain, shortness of breath.  He was alert and oriented.  Because of history of PEA arrest in July 2016 he was brought to the ER for further evaluation and treatment.  Of note initial troponin normal and EKG unremarkable for this.  Potassium was also normal.  Upon my examination of the patient he reports he is back to baseline.  He denied recent issues regarding dizziness with ambulation, chest pain, shortness of breath or palpitations.  He states he has fallen twice in the past several days due to either stumbling or rolling walker malfunction.  When asked about whether he followed up with neurosurgery after discharge in October he told me he just forgot to do this.  He states he does not receive heparin with his dialysis treatment.  He also tells me that he was informed by staff prior to dialysis that he was 2.1 kg over dry weight and states they took 3 kg off today.  ED Course:  Vital Signs: BP 101/70   Pulse 75    Temp 98.4 F (36.9 C) (Oral)   Resp 14   SpO2 98%  CXR: Neg CT head without contrast: No acute intracranial changes; interval resolution of prior small right subdural hematoma Lab data: Sodium 137, potassium 3.9, chloride 99, CO2 28, glucose 99, BUN 9, creatinine 4.83, anion gap 10, alkaline phosphatase 141, albumin 3.2, LFTs not elevated, poc troponin 0.00, coags within normal limits, white count 5200 with normal differential, hemoglobin 10.9, MCV 100.9, platelets 188,000 Medications and treatments: NS 500 cc  Review of Systems:  In addition to the HPI above,  No Fever-chills, myalgias or other constitutional symptoms No Headache, changes with Vision or hearing, new weakness, tingling, numbness in any extremity, dizziness, dysarthria or word finding difficulty, gait disturbance or imbalance, tremors or seizure activity-??  Syncopal activity as noted above/described in HPI No problems swallowing food or Liquids, indigestion/reflux, choking or coughing while eating, abdominal pain with or after eating No Chest pain, Cough or Shortness of Breath, palpitations, orthopnea or DOE No Abdominal pain, N/V, melena,hematochezia, dark tarry stools, constipation No dysuria, malodorous urine, hematuria or flank pain No new skin rashes, lesions, masses or bruises, No new joint pains, aches, swelling or redness No recent unintentional weight gain or loss No polyuria, polydypsia or polyphagia   Past Medical History:  Diagnosis Date  . Allergic rhinitis 07/07/2014  . Anemia due to other cause 07/07/2014  . Arthritis    "all over"  . Atrial fibrillation ([place]) 12/30/2012  . CHF (congestive heart failure) ([place]) 12/30/2012  . Chronic lower back pain   .  Diabetes mellitus with neuropathy ([place])   . Diabetic retinopathy ([place]) 12/30/2012   no medications  . Dysrhythmia    Afib  . ESRD (end stage renal disease) on dialysis ([place]) started in 2013   MWF; [facility]; [employer]  . GERD (gastroesophageal  reflux disease)   . Glaucoma   . History of gout    "right big toe"  . Hyperlipidemia   . Hypertension   . Hypothyroid    01/17/16- no longer on meduication  . Morbid obesity ([place]) 12/30/2012  . Myocardial infarction ([place])    "I've had ~ 3; last one was in ~ 10/2013" (01/30/2014)  . OSA (obstructive sleep apnea) 12/30/2012   "lost weight; no longer needed CPAP; retested said I needed it; didn't followup cause I was feeling fine" (01/30/2014)  . Pulmonary hypertension ([place]) 12/30/2012  . Refusal of blood transfusions as patient is Jehovah's Witness   . Stroke [facility]) ~ 2005; ~ 2005   "they were mild; I didn't even notice I'd had them"; denies residual on 01/30/2014  . Subdural hematoma (Oct 2018)    Coumadin and heparin placed on hold at dc- pt did not FU w/ NS as recommended after dc so not on warfarin when admitted 04/03/17    Past Surgical History:  Procedure Laterality Date  . AV FISTULA PLACEMENT Left ~ 10/2013   "forearm"  . CARDIAC CATHETERIZATION N/A 01/07/2015   Procedure: Left Heart Cath and Coronary Angiography;  Surgeon: Troy Sine, MD;  Location: [place] [facility];  Service: Cardiovascular;  Laterality: N/A;  . CLOSED REDUCTION HIP DISLOCATION Right 1970's  . JOINT REPLACEMENT    . TOTAL HIP ARTHROPLASTY Right 1980  . TOTAL KNEE ARTHROPLASTY Right 01/24/2016   Procedure: TOTAL KNEE ARTHROPLASTY;  Surgeon: Dorna Leitz, MD;  Location: [place];  Service: Orthopedics;  Laterality: Right;  . TOTAL KNEE ARTHROPLASTY Left 06/26/2016   Procedure: TOTAL KNEE ARTHROPLASTY;  Surgeon: Dorna Leitz, MD;  Location: [place];  Service: Orthopedics;  Laterality: Left;    Social History   Socioeconomic History  . Marital status: Single    Spouse name: Not on file  . Number of children: 2  . Years of education: Not on file  . Highest education level: Not on file  Social Needs  . Financial resource strain: Not on file  . Food insecurity - worry: Not on file  . Food insecurity -  inability: Not on file  . Transportation needs - medical: Not on file  . Transportation needs - non-medical: Not on file  Occupational History  . Not on file  Tobacco Use  . Smoking status: Never Smoker  . Smokeless tobacco: Never Used  Substance and Sexual Activity  . Alcohol use: Yes    Alcohol/week: 4.2 - 4.8 oz    Types: 1 Cans of beer, 3 Shots of liquor, 3 - 4 Standard drinks or equivalent per week    Comment: 07/03/16 1 pint 2-3X/week  . Drug use: No  . Sexual activity: Not Currently  Other Topics Concern  . Not on file  Social History Narrative  . Not on file    Mobility: Rolling walker Work history: Disabled   Allergies  Allergen Reactions  . Tobacco [Nicotiana Tabacum] Shortness Of Breath    Family History  Problem Relation Age of Onset  . Arthritis Other   . Heart disease Other   . Hyperlipidemia Other   . Hypertension Other   . Kidney disease Other   . Diabetes Other   .  Stroke Other   . Diabetes Mother   . Cancer Father        bone cancer  . Heart disease Father   . Diabetes Sister   . Diabetes Brother   . Stroke Brother      Prior to Admission medications   Medication Sig Start Date End Date Taking? Authorizing Provider  acetaminophen (TYLENOL) 650 MG CR tablet Take 650 mg by mouth every 8 (eight) hours as needed for pain.   Yes [provider]  allopurinol (ZYLOPRIM) 100 MG tablet Take 1 tablet (100 mg total) by mouth 2 (two) times daily. 07/31/16  Yes Medina-Vargas, Monina C, NP  amitriptyline (ELAVIL) 50 MG tablet Take 1 tablet (50 mg total) by mouth at bedtime. 07/31/16  Yes Medina-Vargas, Monina C, NP  b complex-vitamin c-folic acid (NEPHRO-VITE) 0.8 MG TABS tablet Take 1 tablet by mouth daily.   Yes [provider]  carvedilol (COREG) 25 MG tablet Take 1 tablet (25 mg total) by mouth See admin instructions. Give 12.5 tablet BID on M-W-fri, give 1 tablet qd on Tue-Thu-Sun-Sat Patient taking differently: Take 12.5-25 mg by mouth  See admin instructions. Give 12.5mg  twice daily MW and 25mg  daily on Tue-Thu-Sun-Sat 12/01/16  Yes Vann, Jessica U, DO  Cyanocobalamin (VITAMIN B-12) 5000 MCG SUBL Place 1 tablet under the tongue once a week.   Yes [provider]  Dextromethorphan-Guaifenesin (MUCINEX DM) 30-600 MG TB12 Take 1 tablet by mouth daily as needed.    Yes [provider]  docusate sodium (COLACE) 100 MG capsule Take 100 mg by mouth daily as needed.    Yes [provider]  ferric citrate (AURYXIA) 1 GM 210 MG(Fe) tablet Take 1 tablet (210 mg total) by mouth 3 (three) times daily with meals. 07/31/16  Yes Medina-Vargas, Monina C, NP  gabapentin (NEURONTIN) 100 MG capsule Take 2 capsules (200 mg total) by mouth at bedtime. 200 mg qam and 300 mg upon return from dialysis on M-W-F for neuropathy 07/31/16  Yes Medina-Vargas, Monina C, NP  NON FORMULARY Take 1 capsule by mouth as needed (congestion). Slippery elm   Yes [provider]  Pramoxine-Menthol-Dimethicone (GOLD BOND MEDICATED ANTI ITCH EX) Apply 1 application topically daily as needed. Apply to skin twice a day for itching.    Yes [provider]  simvastatin (ZOCOR) 20 MG tablet Take 1 tablet (20 mg total) by mouth daily. Patient taking differently: Take 20 mg by mouth at bedtime.  07/31/16  Yes Medina-Vargas, Monina C, NP  sodium chloride (OCEAN) 0.65 % SOLN nasal spray Place 1 spray into both nostrils as needed for congestion. 01/31/14  Yes Ghimire, Henreitta Leber, MD  traMADol (ULTRAM) 50 MG tablet Take 50 mg by mouth every 6 (six) hours as needed for moderate pain.   Yes [provider]  oxyCODONE-acetaminophen (PERCOCET/ROXICET) 5-325 MG tablet Take 1-2 tablets by mouth every 8 (eight) hours as needed for severe pain. Patient not taking: Reported on 04/03/2017 12/07/16   Antonietta Breach, PA-C  silver sulfADIAZINE (SILVADENE) 1 % cream Apply 1 application topically daily. Apply to wound Patient not taking: Reported on  04/03/2017 12/07/16   Antonietta Breach, PA-C    Physical Exam: Vitals:   04/03/17 1259 04/03/17 1300 04/03/17 1315 04/03/17 1330  BP:  113/62 (!) 102/56 101/70  Pulse:  85 73 75  Resp:  (!) 23 (!) 26 14  Temp:      TempSrc:      SpO2: 98% 95% 96% 98%  Constitutional: NAD, calm, comfortable Eyes: PERRL, lids and conjunctivae normal ENMT: Mucous membranes are dry. Posterior pharynx clear of any exudate or lesions. Neck: normal, supple, no masses, no thyromegaly Respiratory: clear to auscultation bilaterally, no wheezing, no crackles. Normal respiratory effort. No accessory muscle use.  Cardiovascular: Irregular rate rhythm/atrial fibrillation, no murmurs / rubs / gallops.  Trace, chronic appearing bilateral lower extremity edema. 2+ pedal pulses. No carotid bruits.  Abdomen: no tenderness, no masses palpated. No hepatosplenomegaly. Bowel sounds positive.  Musculoskeletal: no clubbing / cyanosis. No joint deformity upper and lower extremities. Good ROM, no contractures. Normal muscle tone.  Skin: no rashes, lesions, ulcers. No induration-irregular shaped calciphylaxis lesion left posterior calf region Neurologic: CN 2-12 grossly intact. Sensation intact, DTR normal. Strength 5/5 x all 4 extremities.  Psychiatric: Normal judgment and insight. Alert and oriented x 3. Normal mood.    Labs on Admission: I have personally reviewed following labs and imaging studies  CBC: Recent Labs  Lab 04/03/17 1300 04/03/17 1348  WBC 5.2  --   NEUTROABS 3.0  --   HGB 10.9* 12.6*  HCT 35.4* 37.0*  MCV 100.9*  --   PLT 188  --    Basic Metabolic Panel: Recent Labs  Lab 04/03/17 1300 04/03/17 1348  NA 137 138  K 3.9 3.9  CL 99* 98*  CO2 28  --   GLUCOSE 99 93  BUN 9 10  CREATININE 4.83* 4.60*  CALCIUM 9.8  --    GFR: CrCl cannot be calculated (Unknown ideal weight.). Liver Function Tests: Recent Labs  Lab 04/03/17 1300  AST 20  ALT 10*  ALKPHOS 141*  BILITOT 0.5  PROT 7.1    ALBUMIN 3.2*   No results for input(s): LIPASE, AMYLASE in the last 168 hours. No results for input(s): AMMONIA in the last 168 hours. Coagulation Profile: Recent Labs  Lab 04/03/17 1300  INR 1.05   Cardiac Enzymes: No results for input(s): CKTOTAL, CKMB, CKMBINDEX, TROPONINI in the last 168 hours. BNP (last 3 results) No results for input(s): PROBNP in the last 8760 hours. HbA1C: No results for input(s): HGBA1C in the last 72 hours. CBG: No results for input(s): GLUCAP in the last 168 hours. Lipid Profile: No results for input(s): CHOL, HDL, LDLCALC, TRIG, CHOLHDL, LDLDIRECT in the last 72 hours. Thyroid Function Tests: No results for input(s): TSH, T4TOTAL, FREET4, T3FREE, THYROIDAB in the last 72 hours. Anemia Panel: No results for input(s): VITAMINB12, FOLATE, FERRITIN, TIBC, IRON, RETICCTPCT in the last 72 hours. Urine analysis:    Component Value Date/Time   COLORURINE YELLOW 01/17/2016 1047   APPEARANCEUR CLOUDY (A) 01/17/2016 1047   LABSPEC 1.016 01/17/2016 1047   PHURINE 5.0 01/17/2016 1047   GLUCOSEU NEGATIVE 01/17/2016 1047   GLUCOSEU NEGATIVE 12/30/2012 1103   HGBUR LARGE (A) 01/17/2016 1047   BILIRUBINUR SMALL (A) 01/17/2016 1047   KETONESUR NEGATIVE 01/17/2016 1047   PROTEINUR 100 (A) 01/17/2016 1047   UROBILINOGEN 1.0 08/25/2014 0641   NITRITE NEGATIVE 01/17/2016 1047   LEUKOCYTESUR LARGE (A) 01/17/2016 1047   Sepsis Labs: @LABRCNTIP (procalcitonin:4,lacticidven:4) )No results found for this or any previous visit (from the past 240 hour(s)).   Radiological Exams on Admission: Ct Head Wo Contrast  Result Date: 04/03/2017 CLINICAL DATA:  Altered level of consciousness with transient nonresponsiveness during dialysis EXAM: CT HEAD WITHOUT CONTRAST TECHNIQUE: Contiguous axial images were obtained from the base of the skull through the vertex without intravenous contrast. COMPARISON:  December 27, 2016 FINDINGS: Brain: Mild diffuse atrophy appears  stable.  Prior small right-sided subdural hematoma is no longer evident. Midline shift noted previously has resolved. No new extra-axial fluid collections. There is no acute hemorrhage or intracranial mass. There is small vessel disease throughout the centra semiovale bilaterally. There is no acute infarct appreciable. Vascular: No hyperdense vessel evident. There is calcification in both distal vertebral arteries as well as in both carotid siphon regions. Skull: The bony calvarium appears intact. Sinuses/Orbits: There is opacification in multiple ethmoid air cells. There is mucosal thickening in both posterior sphenoid sinuses. There is opacification in both inferior maxillary antra with a small air-fluid level in the right maxillary antrum. Orbits appear symmetric bilaterally. Other: Mastoid air cells are clear. IMPRESSION: Atrophy with supratentorial small vessel disease. No acute infarct evident. No mass or hemorrhage. Interval resolution of prior small right subdural hematoma. No extra-axial fluid collection or midline shift currently. There are multiple foci of arterial vascular calcification. There is multifocal paranasal sinus disease. Electronically Signed   By: Lowella Grip III M.D.   On: 04/03/2017 13:33   Dg Chest Port 1 View  Result Date: 04/03/2017 CLINICAL DATA:  Altered mental status. EXAM: PORTABLE CHEST 1 VIEW COMPARISON:  01/29/2016 FINDINGS: Heart size and pulmonary vascularity are normal. Interstitial markings are minimally accentuated with no acute infiltrates or effusions. No acute bone abnormality. IMPRESSION: No active disease. Electronically Signed   By: Lorriane Shire M.D.   On: 04/03/2017 13:33    EKG: (Independently reviewed) atrial fibrillation with ventricular rate 87 bpm, QTC 451 ms, occasional PVC bigeminy which is unifocal in etiology  Assessment/Plan Principal Problem:   Syncope and collapse -Patient sent to ER in context of unresponsive episode immediately post dialysis  with brief application of CPR with patient awakening immediately and asking staff to stop chest compressions -Suspect either vasovagal etiology versus stasis-EDP has given normal saline 500 cc x1 -Obtain orthostatic vital signs -Follow telemetry given underlying history of atrial fibrillation -No focal neurological deficits on exam -Mobilize with assistance -Echocardiogram  Active Problems:   History of subdural hematoma Oct 2018 -Patient's warfarin was discontinued by neurosurgery during previous admission as above -Patient did not follow-up with neurosurgery after discharge as instructed therefore has never resumed anticoagulation and apparently is also not receiving heparin with dialysis for same reason -Presented with 2-day headache with apparent spontaneous subdural hematoma-according to admission H/P on-call neurosurgeon was consulted and requested repeat CT head, hold off on warfarin and reverse warfarin at that time he was given 1 dose of Kcentra at time of admission -According to ED documentation someone spoke with "Maudie Mercury of neurosurgery" who reviewed the CT scan and made the above recommendations.     Atrial fibrillation -Currently rate controlled -Has not been on anticoagulation since October 2018 in context of spontaneous subdural hematoma -Need to discuss with neurosurgery prior to discharge whether appropriate to resume anticoagulation -CHA2DS2-VASc=3 -Continue carvedilol -Apparently failed/did not tolerate amiodarone previously    CKD (chronic kidney disease) stage V requiring chronic dialysis  -Completed hemodialysis today -Post dialysis potassium less than 4    Anemia due to other cause/ Refusal of blood transfusions as patient is Jehovah's Witness -Hemoglobin stable and around baseline of 10 -Continue Auryxia    History of CVA (cerebrovascular accident) -Continue statin    Hypothyroid -Continue Synthroid    Prediabetes-HgbA1c 6.2 -Continue to monitor     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 ANP-BC [facility] Pager [phone]   If 7PM-7AM, please contact night-coverage [email or web address] Password [facility]  04/03/2017, 3:40 PM

## 2017-04-03 NOTE — ED Triage Notes (Signed)
Pt in from HD via [facility] pt completed full tx and was found to be unresponsive, AED applied with no shock advised, staff done 20 chest compressions while the pt was sitting in a recliner, pt reported to have became responsive and told staff to get off of his chest, denies pain, denies SOB, pt initially A&O x3, disoriented to place,pt A&O x4 upon arrival to ED, hx of cardiac arrest

## 2017-04-03 NOTE — ED Provider Notes (Signed)
[facility] EMERGENCY DEPARTMENT Provider Note   CSN: [id number] Arrival date & time: 04/03/17  1247     History   Chief Complaint Chief Complaint  Patient presents with  . Altered Mental Status    HPI Daniel Whitney is a 62 y.o. male history of A. Fib, ESRD on HD (last HD was today), DM, here presenting with altered mental status, possible cardiac arrest.  Patient just finished a full dialysis treatment earlier today.  Upon completing the treatment, patient was noted to be unresponsive.  There was no pulse at that time and 20 chest compressions were performed but no meds were given.  The ED was put on the patient and there was no shock.  Patient states that he remembered everything and eventually woke up and told the staff to "get off his chest."He did have recent admission for subdural hemorrhage but denies any recent falls or headaches.  Patient denies any history of seizures.  The history is provided by the patient.    Past Medical History:  Diagnosis Date  . Allergic rhinitis 07/07/2014  . Anemia due to other cause 07/07/2014  . Arthritis    "all over"  . Atrial fibrillation ([place]) 12/30/2012  . CHF (congestive heart failure) ([place]) 12/30/2012  . Chronic lower back pain   . Diabetes mellitus with neuropathy ([place])   . Diabetic retinopathy ([place]) 12/30/2012   no medications  . Dysrhythmia    Afib  . ESRD (end stage renal disease) on dialysis ([place]) started in 2013   MWF; [facility]; [employer]  . GERD (gastroesophageal reflux disease)   . Glaucoma   . History of gout    "right big toe"  . Hyperlipidemia   . Hypertension   . Hypothyroid    01/17/16- no longer on meduication  . Morbid obesity ([place]) 12/30/2012  . Myocardial infarction ([place])    "I've had ~ 3; last one was in ~ 10/2013" (01/30/2014)  . OSA (obstructive sleep apnea) 12/30/2012   "lost weight; no longer needed CPAP; retested said I needed it; didn't followup cause I was feeling fine"  (01/30/2014)  . Pulmonary hypertension ([place]) 12/30/2012  . Refusal of blood transfusions as patient is Jehovah's Witness   . Stroke [facility]) ~ 2005; ~ 2005   "they were mild; I didn't even notice I'd had them"; denies residual on 01/30/2014    Patient Active Problem List   Diagnosis Date Noted  . Subdural hematoma ([place]) 11/29/2016  . Peripheral neuropathy 07/26/2016  . At risk for adverse drug reaction 07/03/2016  . Primary osteoarthritis of left knee 06/26/2016  . Postoperative anemia due to acute blood loss 01/27/2016  . S/P knee replacement 01/24/2016  . Low back pain 02/09/2015  . Bilateral knee pain 01/27/2015  . Atypical atrial flutter ([place]) 01/24/2015  . Acute on chronic combined systolic and diastolic heart failure ([place]) 01/05/2015  . Hyperkalemia 11/08/2014  . Hypocalcemia 11/08/2014  . Recurrent falls 10/13/2014  . Venous stasis ulcer ([place]) 10/13/2014  . Hypoxia   . Leukocytosis   . Respiratory failure ([place])   . ARDS (adult respiratory distress syndrome) ([place])   . Encounter for central line placement   . Cardiopulmonary arrest ([place])   . Compression fracture of L3 lumbar vertebra ([place]) 08/04/2014  . Anemia of chronic renal failure, unspecified stage 07/07/2014  . Allergic rhinitis 07/07/2014  . Right rotator cuff tear 04/27/2014  . Acute gouty arthritis 11/11/2013  . Hearing loss in right ear 11/11/2013  . Chronic pain  syndrome 11/11/2013  . Right shoulder pain 11/11/2013  . Facial rash 11/01/2013  . Right otitis externa 10/27/2013  . Pulmonary HTN- PA 58 mmHg 04/16/13 09/17/2013  . Right hip pain 08/04/2013  . DOE (dyspnea on exertion) 06/18/2013  . Allergic rhinitis, cause unspecified 06/18/2013  . Carpal tunnel syndrome 06/06/2013  . Primary localized osteoarthrosis, lower leg 06/04/2013  . Preoperative cardiovascular examination 03/10/2013  . Preventative health care 12/30/2012  . Atrial fibrillation- failed Amiodarone 12/30/2012  . CHF (congestive heart  failure) ([place]) 12/30/2012  . Diabetic retinopathy ([place]) 12/30/2012  . Morbid obesity ([place]) 12/30/2012  . OSA (obstructive sleep apnea)- on C-pap 12/30/2012  . Bilateral hearing loss 12/30/2012  . Long term (current) use of anticoagulants 12/30/2012  . Arthritis   . Glaucoma   . Diabetes mellitus with neuropathy ([place])   . Hypertension   . ESRD (end stage renal disease) on dialysis ([place])   . Stroke ([place])   . Hyperlipidemia     Past Surgical History:  Procedure Laterality Date  . AV FISTULA PLACEMENT Left ~ 10/2013   "forearm"  . CARDIAC CATHETERIZATION N/A 01/07/2015   Procedure: Left Heart Cath and Coronary Angiography;  Surgeon: Troy Sine, MD;  Location: [facility];  Service: Cardiovascular;  Laterality: N/A;  . CLOSED REDUCTION HIP DISLOCATION Right 1970's  . JOINT REPLACEMENT    . TOTAL HIP ARTHROPLASTY Right 1980  . TOTAL KNEE ARTHROPLASTY Right 01/24/2016   Procedure: TOTAL KNEE ARTHROPLASTY;  Surgeon: Dorna Leitz, MD;  Location: [facility];  Service: Orthopedics;  Laterality: Right;  . TOTAL KNEE ARTHROPLASTY Left 06/26/2016   Procedure: TOTAL KNEE ARTHROPLASTY;  Surgeon: Dorna Leitz, MD;  Location: [facility];  Service: Orthopedics;  Laterality: Left;       Home Medications    Prior to Admission medications   Medication Sig Start Date End Date Taking? Authorizing Provider  acetaminophen (TYLENOL) 650 MG CR tablet Take 650 mg by mouth every 8 (eight) hours as needed for pain.    [provider]  allopurinol (ZYLOPRIM) 100 MG tablet Take 1 tablet (100 mg total) by mouth 2 (two) times daily. 07/31/16   Medina-Vargas, Monina C, NP  amitriptyline (ELAVIL) 50 MG tablet Take 1 tablet (50 mg total) by mouth at bedtime. 07/31/16   Medina-Vargas, Monina C, NP  b complex-vitamin c-folic acid (NEPHRO-VITE) 0.8 MG TABS tablet Take 1 tablet by mouth daily.    [provider]  carvedilol (COREG) 25 MG tablet Take 1 tablet (25 mg total) by mouth See admin  instructions. Give 12.5 tablet BID on M-W-fri, give 1 tablet qd on Tue-Thu-Sun-Sat Patient taking differently: Take 12.5-25 mg by mouth See admin instructions. Give 12.5mg  twice daily MW and 25mg  daily on Tue-Thu-Sun-Sat 12/01/16   Geradine Girt, DO  Dextromethorphan-Guaifenesin [facility] DM) 30-600 MG TB12 Take 1 tablet by mouth daily as needed.     [provider]  docusate sodium (COLACE) 100 MG capsule Take 100 mg by mouth daily as needed.     [provider]  ferric citrate (AURYXIA) 1 GM 210 MG(Fe) tablet Take 1 tablet (210 mg total) by mouth 3 (three) times daily with meals. Patient not taking: Reported on 11/29/2016 07/31/16   Medina-Vargas, Monina C, NP  gabapentin (NEURONTIN) 100 MG capsule Take 2 capsules (200 mg total) by mouth at bedtime. 200 mg qam and 300 mg upon return from dialysis on M-W-F for neuropathy 07/31/16   Medina-Vargas, Monina C, NP  oxyCODONE-acetaminophen (PERCOCET/ROXICET) 5-325 MG tablet  Take 1-2 tablets by mouth every 8 (eight) hours as needed for severe pain. 12/07/16   Antonietta Breach, PA-C  Pramoxine-Menthol-Dimethicone (GOLD BOND MEDICATED ANTI ITCH EX) Apply 1 application topically daily as needed. Apply to skin twice a day for itching.     [provider]  silver sulfADIAZINE (SILVADENE) 1 % cream Apply 1 application topically daily. Apply to wound 12/07/16   Antonietta Breach, PA-C  simvastatin (ZOCOR) 20 MG tablet Take 1 tablet (20 mg total) by mouth daily. Patient taking differently: Take 20 mg by mouth at bedtime.  07/31/16   Medina-Vargas, Monina C, NP  sodium chloride (OCEAN) 0.65 % SOLN nasal spray Place 1 spray into both nostrils as needed for congestion. 01/31/14   Jonetta Osgood, MD    Family History Family History  Problem Relation Age of Onset  . Arthritis Other   . Heart disease Other   . Hyperlipidemia Other   . Hypertension Other   . Kidney disease Other   . Diabetes Other   . Stroke Other   . Diabetes Mother   .  Cancer Father        bone cancer  . Heart disease Father   . Diabetes Sister   . Diabetes Brother   . Stroke Brother     Social History Social History   Tobacco Use  . Smoking status: Never Smoker  . Smokeless tobacco: Never Used  Substance Use Topics  . Alcohol use: Yes    Alcohol/week: 4.2 - 4.8 oz    Types: 1 Cans of beer, 3 Shots of liquor, 3 - 4 Standard drinks or equivalent per week    Comment: 07/03/16 1 pint 2-3X/week  . Drug use: No     Allergies   Tobacco [nicotiana tabacum]   Review of Systems Review of Systems  Unable to perform ROS: Mental status change  All other systems reviewed and are negative.    Physical Exam Updated Vital Signs BP 101/70   Pulse 75   Temp 98.4 F (36.9 C) (Oral)   Resp 14   SpO2 98%   Physical Exam  Constitutional: He is oriented to person, place, and time.  Alert and oriented   HENT:  Head: Normocephalic.  Mouth/Throat: Oropharynx is clear and moist.  Eyes: Conjunctivae and EOM are normal. Pupils are equal, round, and reactive to light.  Neck: Normal range of motion. Neck supple.  Cardiovascular: Normal rate, regular rhythm and normal heart sounds.  Pulmonary/Chest: Effort normal.  Crackles bilateral bases, some bruising on the chest, no obvious ecchymosis   Abdominal: Soft. Bowel sounds are normal. He exhibits no distension. There is no tenderness. There is no guarding.  Musculoskeletal: Normal range of motion.  Neurological: He is alert and oriented to person, place, and time.  Skin: Skin is warm.  Psychiatric: He has a normal mood and affect.  Nursing note and vitals reviewed.    ED Treatments / Results  Labs (all labs ordered are listed, but only abnormal results are displayed) Labs Reviewed  CBC WITH DIFFERENTIAL/PLATELET - Abnormal; Notable for the following components:      Result Value   RBC 3.51 (*)    Hemoglobin 10.9 (*)    HCT 35.4 (*)    MCV 100.9 (*)    All other components within normal limits    I-STAT CHEM 8, ED - Abnormal; Notable for the following components:   Chloride 98 (*)    Creatinine, Ser 4.60 (*)    Hemoglobin 12.6 (*)  HCT 37.0 (*)    All other components within normal limits  COMPREHENSIVE METABOLIC PANEL  PROTIME-INR  I-STAT TROPONIN, ED    EKG  EKG Interpretation  Date/Time:  Wednesday April 03 2017 12:50:56 EST Ventricular Rate:  87 PR Interval:    QRS Duration: 103 QT Interval:  393 QTC Calculation: 451 R Axis:   -99 Text Interpretation:  Atrial fibrillation Left anterior fascicular block Abnormal R-wave progression, late transition No significant change since last tracing Confirmed by Wandra Arthurs [phone]) on 04/03/2017 1:01:54 PM       Radiology Ct Head Wo Contrast  Result Date: 04/03/2017 CLINICAL DATA:  Altered level of consciousness with transient nonresponsiveness during dialysis EXAM: CT HEAD WITHOUT CONTRAST TECHNIQUE: Contiguous axial images were obtained from the base of the skull through the vertex without intravenous contrast. COMPARISON:  December 27, 2016 FINDINGS: Brain: Mild diffuse atrophy appears stable. Prior small right-sided subdural hematoma is no longer evident. Midline shift noted previously has resolved. No new extra-axial fluid collections. There is no acute hemorrhage or intracranial mass. There is small vessel disease throughout the centra semiovale bilaterally. There is no acute infarct appreciable. Vascular: No hyperdense vessel evident. There is calcification in both distal vertebral arteries as well as in both carotid siphon regions. Skull: The bony calvarium appears intact. Sinuses/Orbits: There is opacification in multiple ethmoid air cells. There is mucosal thickening in both posterior sphenoid sinuses. There is opacification in both inferior maxillary antra with a small air-fluid level in the right maxillary antrum. Orbits appear symmetric bilaterally. Other: Mastoid air cells are clear. IMPRESSION: Atrophy with  supratentorial small vessel disease. No acute infarct evident. No mass or hemorrhage. Interval resolution of prior small right subdural hematoma. No extra-axial fluid collection or midline shift currently. There are multiple foci of arterial vascular calcification. There is multifocal paranasal sinus disease. Electronically Signed   By: Lowella Grip III M.D.   On: 04/03/2017 13:33   Dg Chest Port 1 View  Result Date: 04/03/2017 CLINICAL DATA:  Altered mental status. EXAM: PORTABLE CHEST 1 VIEW COMPARISON:  01/29/2016 FINDINGS: Heart size and pulmonary vascularity are normal. Interstitial markings are minimally accentuated with no acute infiltrates or effusions. No acute bone abnormality. IMPRESSION: No active disease. Electronically Signed   By: Lorriane Shire M.D.   On: 04/03/2017 13:33    Procedures Procedures (including critical care time)  Medications Ordered in ED Medications - No data to display   Initial Impression / Assessment and Plan / ED Course  I have reviewed the triage vital signs and the nursing notes.  Pertinent labs & imaging results that were available during my care of the patient were reviewed by me and considered in my medical decision making (see chart for details).     Daniel Whitney is a 62 y.o. male here with unresponsive, possible cardiac arrest vs syncope vs seizure. Alert and oriented now. Finished dialysis earlier. Will get CT head given hx of subdural hemorrhage. Will get labs, CXR. Will admit for observation.   2:04 PM CT head showed resolved Subdural hemorrhage. Labs at baseline. EKG unchanged. Trop neg. Will admit for syncope vs seizure. Unclear if he actually had a cardiac arrest.    Final Clinical Impressions(s) / ED Diagnoses   Final diagnoses:  None    ED Discharge Orders    None       Drenda Freeze, MD 04/03/17 [phone]

## 2017-04-04 ENCOUNTER — Other Ambulatory Visit: Payer: Self-pay

## 2017-04-04 ENCOUNTER — Observation Stay (HOSPITAL_BASED_OUTPATIENT_CLINIC_OR_DEPARTMENT_OTHER): Payer: Medicare Other

## 2017-04-04 DIAGNOSIS — I482 Chronic atrial fibrillation: Secondary | ICD-10-CM | POA: Diagnosis not present

## 2017-04-04 DIAGNOSIS — I340 Nonrheumatic mitral (valve) insufficiency: Secondary | ICD-10-CM | POA: Diagnosis not present

## 2017-04-04 DIAGNOSIS — R4182 Altered mental status, unspecified: Secondary | ICD-10-CM | POA: Diagnosis not present

## 2017-04-04 DIAGNOSIS — R55 Syncope and collapse: Secondary | ICD-10-CM | POA: Diagnosis not present

## 2017-04-04 LAB — TROPONIN I: Troponin I: <0.03 ng/mL (ref <0.03)

## 2017-04-04 LAB — ECHOCARDIOGRAM COMPLETE
Height: 69 in
WEIGHTICAEL: 4035.3 [oz_av]

## 2017-04-04 LAB — MRSA PCR SCREENING: MRSA BY PCR: NEGATIVE

## 2017-04-04 MED ORDER — DOXERCALCIFEROL 4 MCG/2ML IV SOLN: 4.0000 ug | INTRAVENOUS | Status: DC

## 2017-04-04 MED ORDER — FERRIC CITRATE 1 GM 210 MG(FE) PO TABS
630.0000 mg | ORAL_TABLET | Freq: Three times a day (TID) | ORAL | Status: DC
  Administered 2017-04-04: 210 mg via ORAL
  Filled 2017-04-04 (×2): qty 3

## 2017-04-04 MED ORDER — NEPRO/CARBSTEADY PO LIQD: 237.0000 mL | Freq: Two times a day (BID) | ORAL | Status: DC

## 2017-04-04 NOTE — Progress Notes (Addendum)
Daniel Whitney is a 62 Y/O AA male with ESRD on hemodialysis MWF at [facility]. PMH DM, HTN, Afib, CAD, CHF, SDH, S/P cardiac arrest X 2. OSA-needs CPAP, AOCD, SHPT. PATIENT IS A [name].   Patient has been admitted as observation patient for syncope and collapse at HD unit 04/03/17. Per patient, "I woke up and they were doing chest compressions!  They were good chest compressions but I didn't need them.". Patient says he feels fine, denies C/O chest pain/SOB. Patient has untreated OSA and has been difficult to arouse at times when seen in HD center. Labs unremarkable CXR without acute abnormalities, AFib on monitor, rate is controlled. Will order HD for tomorrow if still in hospital. Please notify us if patient status upgraded to in patient and we will consult formally.    HD Orders: [facility] MWF 4 hrs 180 NRe 450/800 manual 114 kg 2.0 K/3.5 Ca Linear Sodium  LFA AVF +T/B -No Heparin -Hectorol 4 mcg IV TIW -Venofer 50 mg IV weekly (last dose 04/03/17) -Mircera 60 mcg IV q 2 weeks (last dose 03/27/17)  BMD meds:  -Auryxia 210 mg 3 tabs PO TID AC -Sensipar 30 mg PO daily   Juanell Fairly NP-C [facility] [phone] (Pager)  Pt seen, examined and agree w A/P as above.  Kelly Splinter MD [employer] pager [phone]   04/04/2017, 1:35 PM

## 2017-04-04 NOTE — Discharge Summary (Signed)
Physician Discharge Summary  Daniel Whitney TIR:[id number] DOB: 09-Sep-1955 DOA: 04/03/2017  PCP: [name], MD  Admit date: 04/03/2017 Discharge date: 04/04/2017  Recommendations for Outpatient Follow-up:   Afib - off anticoagulation since 11/2016 for spontaneous subdural hematoma, resolved on CT this admission - f/u with PCP to discuss risk/benefit of anticoagulation    Follow-up Information    Koirala, Dibas, MD. Schedule an appointment as soon as possible for a visit in 1 week(s).   Specialty:  Family Medicine Contact information: [address] [address] [phone]            Discharge Diagnoses:  1. Unresponsive episode, vasovagal syncope 2. Afib 3. ESRD on HD  Discharge Condition: improved Disposition: home   Diet recommendation: heart healthy, diabetic diet  Filed Weights   04/04/17 0041  Weight: 114.4 kg (252 lb 3.3 oz)    History of present illness:  4yom PMH ESRD on HD, afib, DM sent to ED for unresponsive episode at HD with questionable loss of pulse, AED rec no shock, after about 20 chest compressions patient woke up and reported although unable to initially respond he could hear all events. Admitted for further evaluation, possible syncope.  Hospital Course:  Patient was observed overnight. No recurrent event. Telemetry afib with PVCs. ACS ruled out. Echo was reassuring. Hospitalization was uncomplicated.  Unresponsive episode, vasovagal syncope. Suspect vasovagal episode secondary to excessive volume removal. Per nephrology pt has untreated OSA and has been difficult to arouse at times in HD center. - echo reassuring, ACS ruled out  Afib - off anticoagulation since 11/2016 for spontaneous subdural hematoma - f/u with PCP to discuss risk/benefit of anticoagulation - continue carvedilol  ESRD on HD  Subdural hematoma 11/2016 - resolved on CT this admission  Procedures:  Echo Study Conclusions  -  Left ventricle: The cavity size was normal. Wall thickness was increased in a pattern of mild LVH. Systolic function was normal. The estimated ejection fraction was in the range of 50% to 55%. Wall motion was normal; there were no regional wall motion abnormalities. - Mitral valve: Calcified annulus. There was mild regurgitation. - Left atrium: The atrium was severely dilated. - Right atrium: The atrium was mildly dilated. - Pulmonary arteries: PA peak pressure: 32 mm Hg (S).  Impressions:  - Normal LV systolic function; mild LVH; biatrial enlargement; mild MR and TR.  Today's assessment: S: feels well, no chest pain, no SOB, no dizziness. Recalls events from yesterday, denies LOC, thinks too much volume was taken off. O: Vitals:  Vitals:   04/04/17 0041 04/04/17 1437  BP: 121/74 120/78  Pulse: 90 60  Resp:  17  Temp: 98 F (36.7 C) 97.8 F (36.6 C)  SpO2: 95% 97%    Constitutional:  . Appears calm and comfortable Respiratory:  . CTA bilaterally, no w/r/r.  . Respiratory effort normal Cardiovascular:  . RRR, no m/r/g Psychiatric:  . judgement and insight appear intact . Mental status o Mood, affect appropriate   Discharge Instructions  Discharge Instructions    Diet - low sodium heart healthy   Complete by:  As directed    Diet Carb Modified   Complete by:  As directed    Discharge instructions   Complete by:  As directed    Call your physician or seek immediate medical attention for shortness of breath, pain, passing out, dizziness or worsening of condition.   Increase activity slowly   Complete by:  As directed  Allergies as of 04/04/2017      Reactions   Tobacco [nicotiana Tabacum] Shortness Of Breath      Medication List    TAKE these medications   acetaminophen 650 MG CR tablet Commonly known as:  TYLENOL Take 650 mg by mouth every 8 (eight) hours as needed for pain.   allopurinol 100 MG tablet Commonly known as:   ZYLOPRIM Take 1 tablet (100 mg total) by mouth 2 (two) times daily.   amitriptyline 50 MG tablet Commonly known as:  ELAVIL Take 1 tablet (50 mg total) by mouth at bedtime.   b complex-vitamin c-folic acid 0.8 MG Tabs tablet Take 1 tablet by mouth daily.   carvedilol 25 MG tablet Commonly known as:  COREG Take 1 tablet (25 mg total) by mouth See admin instructions. Give 12.5 tablet BID on M-W-fri, give 1 tablet qd on Tue-Thu-Sun-Sat What changed:    how much to take  additional instructions   docusate sodium 100 MG capsule Commonly known as:  COLACE Take 100 mg by mouth daily as needed.   ferric citrate 1 GM 210 MG(Fe) tablet Commonly known as:  AURYXIA Take 1 tablet (210 mg total) by mouth 3 (three) times daily with meals.   gabapentin 100 MG capsule Commonly known as:  NEURONTIN Take 2 capsules (200 mg total) by mouth at bedtime. 200 mg qam and 300 mg upon return from dialysis on M-W-F for neuropathy What changed:  additional instructions   GOLD BOND MEDICATED ANTI ITCH EX Apply 1 application topically daily as needed. Apply to skin twice a day for itching.   MUCINEX DM 30-600 MG Tb12 Take 1 tablet by mouth daily as needed.   NON FORMULARY Take 1 capsule by mouth as needed (congestion). Slippery elm   oxyCODONE-acetaminophen 5-325 MG tablet Commonly known as:  PERCOCET/ROXICET Take 1-2 tablets by mouth every 8 (eight) hours as needed for severe pain.   silver sulfADIAZINE 1 % cream Commonly known as:  SILVADENE Apply 1 application topically daily. Apply to wound   simvastatin 20 MG tablet Commonly known as:  ZOCOR Take 1 tablet (20 mg total) by mouth daily. What changed:  when to take this   sodium chloride 0.65 % Soln nasal spray Commonly known as:  OCEAN Place 1 spray into both nostrils as needed for congestion.   traMADol 50 MG tablet Commonly known as:  ULTRAM Take 50 mg by mouth every 6 (six) hours as needed for moderate pain.   Vitamin B-12 5000  MCG Subl Place 1 tablet under the tongue once a week.      Allergies  Allergen Reactions  . Tobacco [Nicotiana Tabacum] Shortness Of Breath    The results of significant diagnostics from this hospitalization (including imaging, microbiology, ancillary and laboratory) are listed below for reference.    Significant Diagnostic Studies: Ct Head Wo Contrast  Result Date: 04/03/2017 CLINICAL DATA:  Altered level of consciousness with transient nonresponsiveness during dialysis EXAM: CT HEAD WITHOUT CONTRAST TECHNIQUE: Contiguous axial images were obtained from the base of the skull through the vertex without intravenous contrast. COMPARISON:  December 27, 2016 FINDINGS: Brain: Mild diffuse atrophy appears stable. Prior small right-sided subdural hematoma is no longer evident. Midline shift noted previously has resolved. No new extra-axial fluid collections. There is no acute hemorrhage or intracranial mass. There is small vessel disease throughout the centra semiovale bilaterally. There is no acute infarct appreciable. Vascular: No hyperdense vessel evident. There is calcification in both distal vertebral arteries as well  as in both carotid siphon regions. Skull: The bony calvarium appears intact. Sinuses/Orbits: There is opacification in multiple ethmoid air cells. There is mucosal thickening in both posterior sphenoid sinuses. There is opacification in both inferior maxillary antra with a small air-fluid level in the right maxillary antrum. Orbits appear symmetric bilaterally. Other: Mastoid air cells are clear. IMPRESSION: Atrophy with supratentorial small vessel disease. No acute infarct evident. No mass or hemorrhage. Interval resolution of prior small right subdural hematoma. No extra-axial fluid collection or midline shift currently. There are multiple foci of arterial vascular calcification. There is multifocal paranasal sinus disease. Electronically Signed   By: Lowella Grip III M.D.   On:  04/03/2017 13:33   Dg Chest Port 1 View  Result Date: 04/03/2017 CLINICAL DATA:  Altered mental status. EXAM: PORTABLE CHEST 1 VIEW COMPARISON:  01/29/2016 FINDINGS: Heart size and pulmonary vascularity are normal. Interstitial markings are minimally accentuated with no acute infiltrates or effusions. No acute bone abnormality. IMPRESSION: No active disease. Electronically Signed   By: Lorriane Shire M.D.   On: 04/03/2017 13:33    Microbiology: Recent Results (from the past 240 hour(s))  MRSA PCR Screening     Status: None   Collection Time: 04/04/17 12:39 AM  Result Value Ref Range Status   MRSA by PCR NEGATIVE NEGATIVE Final    Comment:        The GeneXpert MRSA Assay (FDA approved for NASAL specimens only), is one component of a comprehensive MRSA colonization surveillance program. It is not intended to diagnose MRSA infection nor to guide or monitor treatment for MRSA infections. Performed at [facility], [place] [address] [place], [place] [address]      Labs: Basic Metabolic Panel: Recent Labs  Lab 04/03/17 1300 04/03/17 1348  NA 137 138  K 3.9 3.9  CL 99* 98*  CO2 28  --   GLUCOSE 99 93  BUN 9 10  CREATININE 4.83* 4.60*  CALCIUM 9.8  --    Liver Function Tests: Recent Labs  Lab 04/03/17 1300  AST 20  ALT 10*  ALKPHOS 141*  BILITOT 0.5  PROT 7.1  ALBUMIN 3.2*   CBC: Recent Labs  Lab 04/03/17 1300 04/03/17 1348  WBC 5.2  --   NEUTROABS 3.0  --   HGB 10.9* 12.6*  HCT 35.4* 37.0*  MCV 100.9*  --   PLT 188  --    Cardiac Enzymes: Recent Labs  Lab 04/03/17 2104 04/04/17 0215  TROPONINI <0.03 <0.03    Principal Problem:   Syncope and collapse Active Problems:   History of CVA (cerebrovascular accident)   CKD (chronic kidney disease) stage V requiring chronic dialysis (HCC)   Hypothyroid   Anemia due to other cause   Refusal of blood transfusions as patient is Jehovah's Witness   Prediabetes-HgbA1c 6.2   History of subdural hematoma  Oct 2018   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

## 2017-04-04 NOTE — Care Management Obs Status (Signed)
[facility] NOTIFICATION   Patient Details  Name: Dujuan Stankowski MRN: [id number] Date of Birth: 08-Oct-1955   Medicare Observation Status Notification Given:  Yes    Bethena Roys, RN 04/04/2017, 4:06 PM

## 2017-04-04 NOTE — Progress Notes (Signed)
  Echocardiogram 2D Echocardiogram has been performed.  Daniel Whitney 04/04/2017, 12:59 PM

## 2017-04-05 DIAGNOSIS — N186 End stage renal disease: Secondary | ICD-10-CM | POA: Diagnosis not present

## 2017-04-05 DIAGNOSIS — D631 Anemia in chronic kidney disease: Secondary | ICD-10-CM | POA: Diagnosis not present

## 2017-04-05 DIAGNOSIS — Z992 Dependence on renal dialysis: Secondary | ICD-10-CM | POA: Diagnosis not present

## 2017-04-05 DIAGNOSIS — E1129 Type 2 diabetes mellitus with other diabetic kidney complication: Secondary | ICD-10-CM | POA: Diagnosis not present

## 2017-04-05 DIAGNOSIS — N2581 Secondary hyperparathyroidism of renal origin: Secondary | ICD-10-CM | POA: Diagnosis not present

## 2017-04-05 DIAGNOSIS — D509 Iron deficiency anemia, unspecified: Secondary | ICD-10-CM | POA: Diagnosis not present

## 2017-04-08 DIAGNOSIS — D509 Iron deficiency anemia, unspecified: Secondary | ICD-10-CM | POA: Diagnosis not present

## 2017-04-08 DIAGNOSIS — E1129 Type 2 diabetes mellitus with other diabetic kidney complication: Secondary | ICD-10-CM | POA: Diagnosis not present

## 2017-04-08 DIAGNOSIS — D631 Anemia in chronic kidney disease: Secondary | ICD-10-CM | POA: Diagnosis not present

## 2017-04-08 DIAGNOSIS — N186 End stage renal disease: Secondary | ICD-10-CM | POA: Diagnosis not present

## 2017-04-08 DIAGNOSIS — N2581 Secondary hyperparathyroidism of renal origin: Secondary | ICD-10-CM | POA: Diagnosis not present

## 2017-04-08 DIAGNOSIS — Z992 Dependence on renal dialysis: Secondary | ICD-10-CM | POA: Diagnosis not present

## 2017-04-10 DIAGNOSIS — D509 Iron deficiency anemia, unspecified: Secondary | ICD-10-CM | POA: Diagnosis not present

## 2017-04-10 DIAGNOSIS — N186 End stage renal disease: Secondary | ICD-10-CM | POA: Diagnosis not present

## 2017-04-10 DIAGNOSIS — E1129 Type 2 diabetes mellitus with other diabetic kidney complication: Secondary | ICD-10-CM | POA: Diagnosis not present

## 2017-04-10 DIAGNOSIS — N2581 Secondary hyperparathyroidism of renal origin: Secondary | ICD-10-CM | POA: Diagnosis not present

## 2017-04-10 DIAGNOSIS — Z992 Dependence on renal dialysis: Secondary | ICD-10-CM | POA: Diagnosis not present

## 2017-04-10 DIAGNOSIS — D631 Anemia in chronic kidney disease: Secondary | ICD-10-CM | POA: Diagnosis not present

## 2017-04-12 DIAGNOSIS — Z992 Dependence on renal dialysis: Secondary | ICD-10-CM | POA: Diagnosis not present

## 2017-04-12 DIAGNOSIS — I120 Hypertensive chronic kidney disease with stage 5 chronic kidney disease or end stage renal disease: Secondary | ICD-10-CM | POA: Diagnosis not present

## 2017-04-12 DIAGNOSIS — N186 End stage renal disease: Secondary | ICD-10-CM | POA: Diagnosis not present

## 2017-04-15 DIAGNOSIS — E1129 Type 2 diabetes mellitus with other diabetic kidney complication: Secondary | ICD-10-CM | POA: Diagnosis not present

## 2017-04-15 DIAGNOSIS — D509 Iron deficiency anemia, unspecified: Secondary | ICD-10-CM | POA: Diagnosis not present

## 2017-04-15 DIAGNOSIS — D631 Anemia in chronic kidney disease: Secondary | ICD-10-CM | POA: Diagnosis not present

## 2017-04-15 DIAGNOSIS — N186 End stage renal disease: Secondary | ICD-10-CM | POA: Diagnosis not present

## 2017-04-15 DIAGNOSIS — N2581 Secondary hyperparathyroidism of renal origin: Secondary | ICD-10-CM | POA: Diagnosis not present

## 2017-04-15 DIAGNOSIS — Z992 Dependence on renal dialysis: Secondary | ICD-10-CM | POA: Diagnosis not present

## 2017-04-16 DIAGNOSIS — M25562 Pain in left knee: Secondary | ICD-10-CM | POA: Diagnosis not present

## 2017-04-16 DIAGNOSIS — M19011 Primary osteoarthritis, right shoulder: Secondary | ICD-10-CM | POA: Diagnosis not present

## 2017-04-16 DIAGNOSIS — M25561 Pain in right knee: Secondary | ICD-10-CM | POA: Diagnosis not present

## 2017-04-17 DIAGNOSIS — D509 Iron deficiency anemia, unspecified: Secondary | ICD-10-CM | POA: Diagnosis not present

## 2017-04-17 DIAGNOSIS — Z992 Dependence on renal dialysis: Secondary | ICD-10-CM | POA: Diagnosis not present

## 2017-04-17 DIAGNOSIS — E1129 Type 2 diabetes mellitus with other diabetic kidney complication: Secondary | ICD-10-CM | POA: Diagnosis not present

## 2017-04-17 DIAGNOSIS — N2581 Secondary hyperparathyroidism of renal origin: Secondary | ICD-10-CM | POA: Diagnosis not present

## 2017-04-17 DIAGNOSIS — D631 Anemia in chronic kidney disease: Secondary | ICD-10-CM | POA: Diagnosis not present

## 2017-04-17 DIAGNOSIS — N186 End stage renal disease: Secondary | ICD-10-CM | POA: Diagnosis not present

## 2017-04-19 DIAGNOSIS — N186 End stage renal disease: Secondary | ICD-10-CM | POA: Diagnosis not present

## 2017-04-19 DIAGNOSIS — E1129 Type 2 diabetes mellitus with other diabetic kidney complication: Secondary | ICD-10-CM | POA: Diagnosis not present

## 2017-04-19 DIAGNOSIS — Z992 Dependence on renal dialysis: Secondary | ICD-10-CM | POA: Diagnosis not present

## 2017-04-19 DIAGNOSIS — D631 Anemia in chronic kidney disease: Secondary | ICD-10-CM | POA: Diagnosis not present

## 2017-04-19 DIAGNOSIS — N2581 Secondary hyperparathyroidism of renal origin: Secondary | ICD-10-CM | POA: Diagnosis not present

## 2017-04-19 DIAGNOSIS — D509 Iron deficiency anemia, unspecified: Secondary | ICD-10-CM | POA: Diagnosis not present

## 2017-04-22 DIAGNOSIS — D631 Anemia in chronic kidney disease: Secondary | ICD-10-CM | POA: Diagnosis not present

## 2017-04-22 DIAGNOSIS — D509 Iron deficiency anemia, unspecified: Secondary | ICD-10-CM | POA: Diagnosis not present

## 2017-04-22 DIAGNOSIS — E1129 Type 2 diabetes mellitus with other diabetic kidney complication: Secondary | ICD-10-CM | POA: Diagnosis not present

## 2017-04-22 DIAGNOSIS — N186 End stage renal disease: Secondary | ICD-10-CM | POA: Diagnosis not present

## 2017-04-22 DIAGNOSIS — N2581 Secondary hyperparathyroidism of renal origin: Secondary | ICD-10-CM | POA: Diagnosis not present

## 2017-04-22 DIAGNOSIS — Z992 Dependence on renal dialysis: Secondary | ICD-10-CM | POA: Diagnosis not present

## 2017-04-24 DIAGNOSIS — D509 Iron deficiency anemia, unspecified: Secondary | ICD-10-CM | POA: Diagnosis not present

## 2017-04-24 DIAGNOSIS — E1129 Type 2 diabetes mellitus with other diabetic kidney complication: Secondary | ICD-10-CM | POA: Diagnosis not present

## 2017-04-24 DIAGNOSIS — N186 End stage renal disease: Secondary | ICD-10-CM | POA: Diagnosis not present

## 2017-04-24 DIAGNOSIS — N2581 Secondary hyperparathyroidism of renal origin: Secondary | ICD-10-CM | POA: Diagnosis not present

## 2017-04-24 DIAGNOSIS — Z992 Dependence on renal dialysis: Secondary | ICD-10-CM | POA: Diagnosis not present

## 2017-04-24 DIAGNOSIS — D631 Anemia in chronic kidney disease: Secondary | ICD-10-CM | POA: Diagnosis not present

## 2017-04-26 DIAGNOSIS — Z125 Encounter for screening for malignant neoplasm of prostate: Secondary | ICD-10-CM | POA: Diagnosis not present

## 2017-04-26 DIAGNOSIS — E7800 Pure hypercholesterolemia, unspecified: Secondary | ICD-10-CM | POA: Diagnosis not present

## 2017-04-26 DIAGNOSIS — M170 Bilateral primary osteoarthritis of knee: Secondary | ICD-10-CM | POA: Diagnosis not present

## 2017-04-26 DIAGNOSIS — R7303 Prediabetes: Secondary | ICD-10-CM | POA: Diagnosis not present

## 2017-04-26 DIAGNOSIS — I482 Chronic atrial fibrillation: Secondary | ICD-10-CM | POA: Diagnosis not present

## 2017-04-26 DIAGNOSIS — I10 Essential (primary) hypertension: Secondary | ICD-10-CM | POA: Diagnosis not present

## 2017-04-26 DIAGNOSIS — N186 End stage renal disease: Secondary | ICD-10-CM | POA: Diagnosis not present

## 2017-04-26 DIAGNOSIS — Z1211 Encounter for screening for malignant neoplasm of colon: Secondary | ICD-10-CM | POA: Diagnosis not present

## 2017-04-26 DIAGNOSIS — Z8679 Personal history of other diseases of the circulatory system: Secondary | ICD-10-CM | POA: Diagnosis not present

## 2017-04-26 DIAGNOSIS — D631 Anemia in chronic kidney disease: Secondary | ICD-10-CM | POA: Diagnosis not present

## 2017-04-26 DIAGNOSIS — I5020 Unspecified systolic (congestive) heart failure: Secondary | ICD-10-CM | POA: Diagnosis not present

## 2017-04-26 DIAGNOSIS — N2581 Secondary hyperparathyroidism of renal origin: Secondary | ICD-10-CM | POA: Diagnosis not present

## 2017-04-26 DIAGNOSIS — E1129 Type 2 diabetes mellitus with other diabetic kidney complication: Secondary | ICD-10-CM | POA: Diagnosis not present

## 2017-04-26 DIAGNOSIS — D509 Iron deficiency anemia, unspecified: Secondary | ICD-10-CM | POA: Diagnosis not present

## 2017-04-26 DIAGNOSIS — Z992 Dependence on renal dialysis: Secondary | ICD-10-CM | POA: Diagnosis not present

## 2017-04-29 DIAGNOSIS — N186 End stage renal disease: Secondary | ICD-10-CM | POA: Diagnosis not present

## 2017-04-29 DIAGNOSIS — D631 Anemia in chronic kidney disease: Secondary | ICD-10-CM | POA: Diagnosis not present

## 2017-04-29 DIAGNOSIS — E1129 Type 2 diabetes mellitus with other diabetic kidney complication: Secondary | ICD-10-CM | POA: Diagnosis not present

## 2017-04-29 DIAGNOSIS — Z992 Dependence on renal dialysis: Secondary | ICD-10-CM | POA: Diagnosis not present

## 2017-04-29 DIAGNOSIS — D509 Iron deficiency anemia, unspecified: Secondary | ICD-10-CM | POA: Diagnosis not present

## 2017-04-29 DIAGNOSIS — N2581 Secondary hyperparathyroidism of renal origin: Secondary | ICD-10-CM | POA: Diagnosis not present

## 2017-05-01 DIAGNOSIS — D631 Anemia in chronic kidney disease: Secondary | ICD-10-CM | POA: Diagnosis not present

## 2017-05-01 DIAGNOSIS — N186 End stage renal disease: Secondary | ICD-10-CM | POA: Diagnosis not present

## 2017-05-01 DIAGNOSIS — N2581 Secondary hyperparathyroidism of renal origin: Secondary | ICD-10-CM | POA: Diagnosis not present

## 2017-05-01 DIAGNOSIS — Z992 Dependence on renal dialysis: Secondary | ICD-10-CM | POA: Diagnosis not present

## 2017-05-01 DIAGNOSIS — E1129 Type 2 diabetes mellitus with other diabetic kidney complication: Secondary | ICD-10-CM | POA: Diagnosis not present

## 2017-05-01 DIAGNOSIS — D509 Iron deficiency anemia, unspecified: Secondary | ICD-10-CM | POA: Diagnosis not present

## 2017-05-03 DIAGNOSIS — D509 Iron deficiency anemia, unspecified: Secondary | ICD-10-CM | POA: Diagnosis not present

## 2017-05-03 DIAGNOSIS — D631 Anemia in chronic kidney disease: Secondary | ICD-10-CM | POA: Diagnosis not present

## 2017-05-03 DIAGNOSIS — N186 End stage renal disease: Secondary | ICD-10-CM | POA: Diagnosis not present

## 2017-05-03 DIAGNOSIS — N2581 Secondary hyperparathyroidism of renal origin: Secondary | ICD-10-CM | POA: Diagnosis not present

## 2017-05-03 DIAGNOSIS — Z992 Dependence on renal dialysis: Secondary | ICD-10-CM | POA: Diagnosis not present

## 2017-05-03 DIAGNOSIS — E1129 Type 2 diabetes mellitus with other diabetic kidney complication: Secondary | ICD-10-CM | POA: Diagnosis not present

## 2017-05-06 DIAGNOSIS — N2581 Secondary hyperparathyroidism of renal origin: Secondary | ICD-10-CM | POA: Diagnosis not present

## 2017-05-06 DIAGNOSIS — D631 Anemia in chronic kidney disease: Secondary | ICD-10-CM | POA: Diagnosis not present

## 2017-05-06 DIAGNOSIS — D509 Iron deficiency anemia, unspecified: Secondary | ICD-10-CM | POA: Diagnosis not present

## 2017-05-06 DIAGNOSIS — N186 End stage renal disease: Secondary | ICD-10-CM | POA: Diagnosis not present

## 2017-05-06 DIAGNOSIS — Z992 Dependence on renal dialysis: Secondary | ICD-10-CM | POA: Diagnosis not present

## 2017-05-06 DIAGNOSIS — E1129 Type 2 diabetes mellitus with other diabetic kidney complication: Secondary | ICD-10-CM | POA: Diagnosis not present

## 2017-05-08 DIAGNOSIS — N2581 Secondary hyperparathyroidism of renal origin: Secondary | ICD-10-CM | POA: Diagnosis not present

## 2017-05-08 DIAGNOSIS — D631 Anemia in chronic kidney disease: Secondary | ICD-10-CM | POA: Diagnosis not present

## 2017-05-08 DIAGNOSIS — N186 End stage renal disease: Secondary | ICD-10-CM | POA: Diagnosis not present

## 2017-05-08 DIAGNOSIS — E1129 Type 2 diabetes mellitus with other diabetic kidney complication: Secondary | ICD-10-CM | POA: Diagnosis not present

## 2017-05-08 DIAGNOSIS — Z992 Dependence on renal dialysis: Secondary | ICD-10-CM | POA: Diagnosis not present

## 2017-05-08 DIAGNOSIS — D509 Iron deficiency anemia, unspecified: Secondary | ICD-10-CM | POA: Diagnosis not present

## 2017-05-10 DIAGNOSIS — D509 Iron deficiency anemia, unspecified: Secondary | ICD-10-CM | POA: Diagnosis not present

## 2017-05-10 DIAGNOSIS — N186 End stage renal disease: Secondary | ICD-10-CM | POA: Diagnosis not present

## 2017-05-10 DIAGNOSIS — D631 Anemia in chronic kidney disease: Secondary | ICD-10-CM | POA: Diagnosis not present

## 2017-05-10 DIAGNOSIS — Z992 Dependence on renal dialysis: Secondary | ICD-10-CM | POA: Diagnosis not present

## 2017-05-10 DIAGNOSIS — N2581 Secondary hyperparathyroidism of renal origin: Secondary | ICD-10-CM | POA: Diagnosis not present

## 2017-05-10 DIAGNOSIS — E1129 Type 2 diabetes mellitus with other diabetic kidney complication: Secondary | ICD-10-CM | POA: Diagnosis not present

## 2017-05-13 DIAGNOSIS — Z992 Dependence on renal dialysis: Secondary | ICD-10-CM | POA: Diagnosis not present

## 2017-05-13 DIAGNOSIS — I120 Hypertensive chronic kidney disease with stage 5 chronic kidney disease or end stage renal disease: Secondary | ICD-10-CM | POA: Diagnosis not present

## 2017-05-13 DIAGNOSIS — N186 End stage renal disease: Secondary | ICD-10-CM | POA: Diagnosis not present

## 2017-05-14 DIAGNOSIS — N186 End stage renal disease: Secondary | ICD-10-CM | POA: Diagnosis not present

## 2017-05-14 DIAGNOSIS — E1129 Type 2 diabetes mellitus with other diabetic kidney complication: Secondary | ICD-10-CM | POA: Diagnosis not present

## 2017-05-14 DIAGNOSIS — D631 Anemia in chronic kidney disease: Secondary | ICD-10-CM | POA: Diagnosis not present

## 2017-05-14 DIAGNOSIS — D509 Iron deficiency anemia, unspecified: Secondary | ICD-10-CM | POA: Diagnosis not present

## 2017-05-14 DIAGNOSIS — N2581 Secondary hyperparathyroidism of renal origin: Secondary | ICD-10-CM | POA: Diagnosis not present

## 2017-05-14 DIAGNOSIS — Z992 Dependence on renal dialysis: Secondary | ICD-10-CM | POA: Diagnosis not present

## 2017-05-15 DIAGNOSIS — Z992 Dependence on renal dialysis: Secondary | ICD-10-CM | POA: Diagnosis not present

## 2017-05-15 DIAGNOSIS — N2581 Secondary hyperparathyroidism of renal origin: Secondary | ICD-10-CM | POA: Diagnosis not present

## 2017-05-15 DIAGNOSIS — E1129 Type 2 diabetes mellitus with other diabetic kidney complication: Secondary | ICD-10-CM | POA: Diagnosis not present

## 2017-05-15 DIAGNOSIS — D509 Iron deficiency anemia, unspecified: Secondary | ICD-10-CM | POA: Diagnosis not present

## 2017-05-15 DIAGNOSIS — D631 Anemia in chronic kidney disease: Secondary | ICD-10-CM | POA: Diagnosis not present

## 2017-05-15 DIAGNOSIS — N186 End stage renal disease: Secondary | ICD-10-CM | POA: Diagnosis not present

## 2017-05-17 DIAGNOSIS — D631 Anemia in chronic kidney disease: Secondary | ICD-10-CM | POA: Diagnosis not present

## 2017-05-17 DIAGNOSIS — N186 End stage renal disease: Secondary | ICD-10-CM | POA: Diagnosis not present

## 2017-05-17 DIAGNOSIS — D509 Iron deficiency anemia, unspecified: Secondary | ICD-10-CM | POA: Diagnosis not present

## 2017-05-17 DIAGNOSIS — Z992 Dependence on renal dialysis: Secondary | ICD-10-CM | POA: Diagnosis not present

## 2017-05-17 DIAGNOSIS — E1129 Type 2 diabetes mellitus with other diabetic kidney complication: Secondary | ICD-10-CM | POA: Diagnosis not present

## 2017-05-17 DIAGNOSIS — N2581 Secondary hyperparathyroidism of renal origin: Secondary | ICD-10-CM | POA: Diagnosis not present

## 2017-05-22 DIAGNOSIS — Z992 Dependence on renal dialysis: Secondary | ICD-10-CM | POA: Diagnosis not present

## 2017-05-22 DIAGNOSIS — D631 Anemia in chronic kidney disease: Secondary | ICD-10-CM | POA: Diagnosis not present

## 2017-05-22 DIAGNOSIS — N186 End stage renal disease: Secondary | ICD-10-CM | POA: Diagnosis not present

## 2017-05-22 DIAGNOSIS — D509 Iron deficiency anemia, unspecified: Secondary | ICD-10-CM | POA: Diagnosis not present

## 2017-05-22 DIAGNOSIS — E1129 Type 2 diabetes mellitus with other diabetic kidney complication: Secondary | ICD-10-CM | POA: Diagnosis not present

## 2017-05-22 DIAGNOSIS — N2581 Secondary hyperparathyroidism of renal origin: Secondary | ICD-10-CM | POA: Diagnosis not present

## 2017-05-24 DIAGNOSIS — N186 End stage renal disease: Secondary | ICD-10-CM | POA: Diagnosis not present

## 2017-05-24 DIAGNOSIS — N2581 Secondary hyperparathyroidism of renal origin: Secondary | ICD-10-CM | POA: Diagnosis not present

## 2017-05-24 DIAGNOSIS — D509 Iron deficiency anemia, unspecified: Secondary | ICD-10-CM | POA: Diagnosis not present

## 2017-05-24 DIAGNOSIS — Z992 Dependence on renal dialysis: Secondary | ICD-10-CM | POA: Diagnosis not present

## 2017-05-24 DIAGNOSIS — D631 Anemia in chronic kidney disease: Secondary | ICD-10-CM | POA: Diagnosis not present

## 2017-05-24 DIAGNOSIS — E1129 Type 2 diabetes mellitus with other diabetic kidney complication: Secondary | ICD-10-CM | POA: Diagnosis not present

## 2017-05-28 DIAGNOSIS — D509 Iron deficiency anemia, unspecified: Secondary | ICD-10-CM | POA: Diagnosis not present

## 2017-05-28 DIAGNOSIS — D631 Anemia in chronic kidney disease: Secondary | ICD-10-CM | POA: Diagnosis not present

## 2017-05-28 DIAGNOSIS — N2581 Secondary hyperparathyroidism of renal origin: Secondary | ICD-10-CM | POA: Diagnosis not present

## 2017-05-28 DIAGNOSIS — E1129 Type 2 diabetes mellitus with other diabetic kidney complication: Secondary | ICD-10-CM | POA: Diagnosis not present

## 2017-05-28 DIAGNOSIS — N186 End stage renal disease: Secondary | ICD-10-CM | POA: Diagnosis not present

## 2017-05-28 DIAGNOSIS — Z992 Dependence on renal dialysis: Secondary | ICD-10-CM | POA: Diagnosis not present

## 2017-05-29 DIAGNOSIS — E1129 Type 2 diabetes mellitus with other diabetic kidney complication: Secondary | ICD-10-CM | POA: Diagnosis not present

## 2017-05-29 DIAGNOSIS — N2581 Secondary hyperparathyroidism of renal origin: Secondary | ICD-10-CM | POA: Diagnosis not present

## 2017-05-29 DIAGNOSIS — D631 Anemia in chronic kidney disease: Secondary | ICD-10-CM | POA: Diagnosis not present

## 2017-05-29 DIAGNOSIS — Z992 Dependence on renal dialysis: Secondary | ICD-10-CM | POA: Diagnosis not present

## 2017-05-29 DIAGNOSIS — D509 Iron deficiency anemia, unspecified: Secondary | ICD-10-CM | POA: Diagnosis not present

## 2017-05-29 DIAGNOSIS — N186 End stage renal disease: Secondary | ICD-10-CM | POA: Diagnosis not present

## 2017-05-30 ENCOUNTER — Encounter (HOSPITAL_BASED_OUTPATIENT_CLINIC_OR_DEPARTMENT_OTHER): Payer: Medicare Other

## 2017-05-31 DIAGNOSIS — N2581 Secondary hyperparathyroidism of renal origin: Secondary | ICD-10-CM | POA: Diagnosis not present

## 2017-05-31 DIAGNOSIS — D509 Iron deficiency anemia, unspecified: Secondary | ICD-10-CM | POA: Diagnosis not present

## 2017-05-31 DIAGNOSIS — D631 Anemia in chronic kidney disease: Secondary | ICD-10-CM | POA: Diagnosis not present

## 2017-05-31 DIAGNOSIS — N186 End stage renal disease: Secondary | ICD-10-CM | POA: Diagnosis not present

## 2017-05-31 DIAGNOSIS — E1129 Type 2 diabetes mellitus with other diabetic kidney complication: Secondary | ICD-10-CM | POA: Diagnosis not present

## 2017-05-31 DIAGNOSIS — Z992 Dependence on renal dialysis: Secondary | ICD-10-CM | POA: Diagnosis not present

## 2017-06-03 DIAGNOSIS — D631 Anemia in chronic kidney disease: Secondary | ICD-10-CM | POA: Diagnosis not present

## 2017-06-03 DIAGNOSIS — E1129 Type 2 diabetes mellitus with other diabetic kidney complication: Secondary | ICD-10-CM | POA: Diagnosis not present

## 2017-06-03 DIAGNOSIS — Z992 Dependence on renal dialysis: Secondary | ICD-10-CM | POA: Diagnosis not present

## 2017-06-03 DIAGNOSIS — D509 Iron deficiency anemia, unspecified: Secondary | ICD-10-CM | POA: Diagnosis not present

## 2017-06-03 DIAGNOSIS — N186 End stage renal disease: Secondary | ICD-10-CM | POA: Diagnosis not present

## 2017-06-03 DIAGNOSIS — N2581 Secondary hyperparathyroidism of renal origin: Secondary | ICD-10-CM | POA: Diagnosis not present

## 2017-06-05 DIAGNOSIS — E1129 Type 2 diabetes mellitus with other diabetic kidney complication: Secondary | ICD-10-CM | POA: Diagnosis not present

## 2017-06-05 DIAGNOSIS — D631 Anemia in chronic kidney disease: Secondary | ICD-10-CM | POA: Diagnosis not present

## 2017-06-05 DIAGNOSIS — N186 End stage renal disease: Secondary | ICD-10-CM | POA: Diagnosis not present

## 2017-06-05 DIAGNOSIS — N2581 Secondary hyperparathyroidism of renal origin: Secondary | ICD-10-CM | POA: Diagnosis not present

## 2017-06-05 DIAGNOSIS — Z992 Dependence on renal dialysis: Secondary | ICD-10-CM | POA: Diagnosis not present

## 2017-06-05 DIAGNOSIS — D509 Iron deficiency anemia, unspecified: Secondary | ICD-10-CM | POA: Diagnosis not present

## 2017-06-07 DIAGNOSIS — N2581 Secondary hyperparathyroidism of renal origin: Secondary | ICD-10-CM | POA: Diagnosis not present

## 2017-06-07 DIAGNOSIS — N186 End stage renal disease: Secondary | ICD-10-CM | POA: Diagnosis not present

## 2017-06-07 DIAGNOSIS — D631 Anemia in chronic kidney disease: Secondary | ICD-10-CM | POA: Diagnosis not present

## 2017-06-07 DIAGNOSIS — D509 Iron deficiency anemia, unspecified: Secondary | ICD-10-CM | POA: Diagnosis not present

## 2017-06-07 DIAGNOSIS — E1129 Type 2 diabetes mellitus with other diabetic kidney complication: Secondary | ICD-10-CM | POA: Diagnosis not present

## 2017-06-07 DIAGNOSIS — Z992 Dependence on renal dialysis: Secondary | ICD-10-CM | POA: Diagnosis not present

## 2017-06-10 DIAGNOSIS — N2581 Secondary hyperparathyroidism of renal origin: Secondary | ICD-10-CM | POA: Diagnosis not present

## 2017-06-10 DIAGNOSIS — D509 Iron deficiency anemia, unspecified: Secondary | ICD-10-CM | POA: Diagnosis not present

## 2017-06-10 DIAGNOSIS — D631 Anemia in chronic kidney disease: Secondary | ICD-10-CM | POA: Diagnosis not present

## 2017-06-10 DIAGNOSIS — N186 End stage renal disease: Secondary | ICD-10-CM | POA: Diagnosis not present

## 2017-06-10 DIAGNOSIS — E1129 Type 2 diabetes mellitus with other diabetic kidney complication: Secondary | ICD-10-CM | POA: Diagnosis not present

## 2017-06-10 DIAGNOSIS — Z992 Dependence on renal dialysis: Secondary | ICD-10-CM | POA: Diagnosis not present

## 2017-06-12 DIAGNOSIS — N186 End stage renal disease: Secondary | ICD-10-CM | POA: Diagnosis not present

## 2017-06-12 DIAGNOSIS — Z992 Dependence on renal dialysis: Secondary | ICD-10-CM | POA: Diagnosis not present

## 2017-06-12 DIAGNOSIS — I120 Hypertensive chronic kidney disease with stage 5 chronic kidney disease or end stage renal disease: Secondary | ICD-10-CM | POA: Diagnosis not present

## 2017-06-15 DIAGNOSIS — I120 Hypertensive chronic kidney disease with stage 5 chronic kidney disease or end stage renal disease: Secondary | ICD-10-CM | POA: Diagnosis present

## 2017-06-15 DIAGNOSIS — N19 Unspecified kidney failure: Secondary | ICD-10-CM | POA: Diagnosis not present

## 2017-06-15 DIAGNOSIS — I482 Chronic atrial fibrillation: Secondary | ICD-10-CM | POA: Diagnosis present

## 2017-06-15 DIAGNOSIS — Z8674 Personal history of sudden cardiac arrest: Secondary | ICD-10-CM | POA: Diagnosis not present

## 2017-06-15 DIAGNOSIS — I358 Other nonrheumatic aortic valve disorders: Secondary | ICD-10-CM | POA: Diagnosis not present

## 2017-06-15 DIAGNOSIS — J9600 Acute respiratory failure, unspecified whether with hypoxia or hypercapnia: Secondary | ICD-10-CM | POA: Diagnosis not present

## 2017-06-15 DIAGNOSIS — J811 Chronic pulmonary edema: Secondary | ICD-10-CM | POA: Diagnosis not present

## 2017-06-15 DIAGNOSIS — J9811 Atelectasis: Secondary | ICD-10-CM | POA: Diagnosis not present

## 2017-06-15 DIAGNOSIS — I517 Cardiomegaly: Secondary | ICD-10-CM | POA: Diagnosis not present

## 2017-06-15 DIAGNOSIS — Z452 Encounter for adjustment and management of vascular access device: Secondary | ICD-10-CM | POA: Diagnosis not present

## 2017-06-15 DIAGNOSIS — G4733 Obstructive sleep apnea (adult) (pediatric): Secondary | ICD-10-CM | POA: Diagnosis not present

## 2017-06-15 DIAGNOSIS — E119 Type 2 diabetes mellitus without complications: Secondary | ICD-10-CM | POA: Diagnosis not present

## 2017-06-15 DIAGNOSIS — Z992 Dependence on renal dialysis: Secondary | ICD-10-CM | POA: Diagnosis not present

## 2017-06-15 DIAGNOSIS — E874 Mixed disorder of acid-base balance: Secondary | ICD-10-CM | POA: Diagnosis present

## 2017-06-15 DIAGNOSIS — I252 Old myocardial infarction: Secondary | ICD-10-CM | POA: Diagnosis not present

## 2017-06-15 DIAGNOSIS — J9622 Acute and chronic respiratory failure with hypercapnia: Secondary | ICD-10-CM | POA: Diagnosis not present

## 2017-06-15 DIAGNOSIS — E875 Hyperkalemia: Secondary | ICD-10-CM | POA: Diagnosis not present

## 2017-06-15 DIAGNOSIS — Z4901 Encounter for fitting and adjustment of extracorporeal dialysis catheter: Secondary | ICD-10-CM | POA: Diagnosis not present

## 2017-06-15 DIAGNOSIS — I4891 Unspecified atrial fibrillation: Secondary | ICD-10-CM | POA: Diagnosis not present

## 2017-06-15 DIAGNOSIS — N189 Chronic kidney disease, unspecified: Secondary | ICD-10-CM | POA: Diagnosis not present

## 2017-06-15 DIAGNOSIS — J810 Acute pulmonary edema: Secondary | ICD-10-CM | POA: Diagnosis not present

## 2017-06-15 DIAGNOSIS — T82858A Stenosis of vascular prosthetic devices, implants and grafts, initial encounter: Secondary | ICD-10-CM | POA: Diagnosis not present

## 2017-06-15 DIAGNOSIS — R4182 Altered mental status, unspecified: Secondary | ICD-10-CM | POA: Diagnosis not present

## 2017-06-15 DIAGNOSIS — E11649 Type 2 diabetes mellitus with hypoglycemia without coma: Secondary | ICD-10-CM | POA: Diagnosis not present

## 2017-06-15 DIAGNOSIS — E662 Morbid (severe) obesity with alveolar hypoventilation: Secondary | ICD-10-CM | POA: Diagnosis present

## 2017-06-15 DIAGNOSIS — J9612 Chronic respiratory failure with hypercapnia: Secondary | ICD-10-CM | POA: Diagnosis not present

## 2017-06-15 DIAGNOSIS — D631 Anemia in chronic kidney disease: Secondary | ICD-10-CM | POA: Diagnosis present

## 2017-06-15 DIAGNOSIS — Z6838 Body mass index (BMI) 38.0-38.9, adult: Secondary | ICD-10-CM | POA: Diagnosis not present

## 2017-06-15 DIAGNOSIS — I2510 Atherosclerotic heart disease of native coronary artery without angina pectoris: Secondary | ICD-10-CM | POA: Diagnosis present

## 2017-06-15 DIAGNOSIS — N179 Acute kidney failure, unspecified: Secondary | ICD-10-CM | POA: Diagnosis not present

## 2017-06-15 DIAGNOSIS — T82868A Thrombosis of vascular prosthetic devices, implants and grafts, initial encounter: Secondary | ICD-10-CM | POA: Diagnosis not present

## 2017-06-15 DIAGNOSIS — J9621 Acute and chronic respiratory failure with hypoxia: Secondary | ICD-10-CM | POA: Diagnosis not present

## 2017-06-15 DIAGNOSIS — E785 Hyperlipidemia, unspecified: Secondary | ICD-10-CM | POA: Diagnosis present

## 2017-06-15 DIAGNOSIS — Z9181 History of falling: Secondary | ICD-10-CM | POA: Diagnosis not present

## 2017-06-15 DIAGNOSIS — I499 Cardiac arrhythmia, unspecified: Secondary | ICD-10-CM | POA: Diagnosis not present

## 2017-06-15 DIAGNOSIS — E871 Hypo-osmolality and hyponatremia: Secondary | ICD-10-CM | POA: Diagnosis present

## 2017-06-15 DIAGNOSIS — Z87891 Personal history of nicotine dependence: Secondary | ICD-10-CM | POA: Diagnosis not present

## 2017-06-15 DIAGNOSIS — N186 End stage renal disease: Secondary | ICD-10-CM | POA: Diagnosis not present

## 2017-06-15 DIAGNOSIS — I442 Atrioventricular block, complete: Secondary | ICD-10-CM | POA: Diagnosis not present

## 2017-06-15 DIAGNOSIS — E1122 Type 2 diabetes mellitus with diabetic chronic kidney disease: Secondary | ICD-10-CM | POA: Diagnosis present

## 2017-06-21 DIAGNOSIS — D631 Anemia in chronic kidney disease: Secondary | ICD-10-CM | POA: Diagnosis not present

## 2017-06-21 DIAGNOSIS — N2581 Secondary hyperparathyroidism of renal origin: Secondary | ICD-10-CM | POA: Diagnosis not present

## 2017-06-21 DIAGNOSIS — E1129 Type 2 diabetes mellitus with other diabetic kidney complication: Secondary | ICD-10-CM | POA: Diagnosis not present

## 2017-06-21 DIAGNOSIS — N186 End stage renal disease: Secondary | ICD-10-CM | POA: Diagnosis not present

## 2017-06-21 DIAGNOSIS — Z992 Dependence on renal dialysis: Secondary | ICD-10-CM | POA: Diagnosis not present

## 2017-06-24 DIAGNOSIS — Z992 Dependence on renal dialysis: Secondary | ICD-10-CM | POA: Diagnosis not present

## 2017-06-24 DIAGNOSIS — E1129 Type 2 diabetes mellitus with other diabetic kidney complication: Secondary | ICD-10-CM | POA: Diagnosis not present

## 2017-06-24 DIAGNOSIS — D631 Anemia in chronic kidney disease: Secondary | ICD-10-CM | POA: Diagnosis not present

## 2017-06-24 DIAGNOSIS — N2581 Secondary hyperparathyroidism of renal origin: Secondary | ICD-10-CM | POA: Diagnosis not present

## 2017-06-24 DIAGNOSIS — N186 End stage renal disease: Secondary | ICD-10-CM | POA: Diagnosis not present

## 2017-06-26 DIAGNOSIS — N2581 Secondary hyperparathyroidism of renal origin: Secondary | ICD-10-CM | POA: Diagnosis not present

## 2017-06-26 DIAGNOSIS — Z992 Dependence on renal dialysis: Secondary | ICD-10-CM | POA: Diagnosis not present

## 2017-06-26 DIAGNOSIS — D631 Anemia in chronic kidney disease: Secondary | ICD-10-CM | POA: Diagnosis not present

## 2017-06-26 DIAGNOSIS — E1129 Type 2 diabetes mellitus with other diabetic kidney complication: Secondary | ICD-10-CM | POA: Diagnosis not present

## 2017-06-26 DIAGNOSIS — N186 End stage renal disease: Secondary | ICD-10-CM | POA: Diagnosis not present

## 2017-06-28 DIAGNOSIS — Z992 Dependence on renal dialysis: Secondary | ICD-10-CM | POA: Diagnosis not present

## 2017-06-28 DIAGNOSIS — E1129 Type 2 diabetes mellitus with other diabetic kidney complication: Secondary | ICD-10-CM | POA: Diagnosis not present

## 2017-06-28 DIAGNOSIS — N2581 Secondary hyperparathyroidism of renal origin: Secondary | ICD-10-CM | POA: Diagnosis not present

## 2017-06-28 DIAGNOSIS — N186 End stage renal disease: Secondary | ICD-10-CM | POA: Diagnosis not present

## 2017-06-28 DIAGNOSIS — D631 Anemia in chronic kidney disease: Secondary | ICD-10-CM | POA: Diagnosis not present

## 2017-07-01 DIAGNOSIS — Z992 Dependence on renal dialysis: Secondary | ICD-10-CM | POA: Diagnosis not present

## 2017-07-01 DIAGNOSIS — N2581 Secondary hyperparathyroidism of renal origin: Secondary | ICD-10-CM | POA: Diagnosis not present

## 2017-07-01 DIAGNOSIS — N186 End stage renal disease: Secondary | ICD-10-CM | POA: Diagnosis not present

## 2017-07-01 DIAGNOSIS — E1129 Type 2 diabetes mellitus with other diabetic kidney complication: Secondary | ICD-10-CM | POA: Diagnosis not present

## 2017-07-01 DIAGNOSIS — D631 Anemia in chronic kidney disease: Secondary | ICD-10-CM | POA: Diagnosis not present

## 2017-07-03 DIAGNOSIS — N2581 Secondary hyperparathyroidism of renal origin: Secondary | ICD-10-CM | POA: Diagnosis not present

## 2017-07-03 DIAGNOSIS — N186 End stage renal disease: Secondary | ICD-10-CM | POA: Diagnosis not present

## 2017-07-03 DIAGNOSIS — Z992 Dependence on renal dialysis: Secondary | ICD-10-CM | POA: Diagnosis not present

## 2017-07-03 DIAGNOSIS — E1129 Type 2 diabetes mellitus with other diabetic kidney complication: Secondary | ICD-10-CM | POA: Diagnosis not present

## 2017-07-03 DIAGNOSIS — D631 Anemia in chronic kidney disease: Secondary | ICD-10-CM | POA: Diagnosis not present

## 2017-07-04 ENCOUNTER — Ambulatory Visit: Payer: Self-pay | Admitting: Pharmacist Clinician (PhC)/ Clinical Pharmacy Specialist

## 2017-07-04 DIAGNOSIS — I4819 Other persistent atrial fibrillation: Secondary | ICD-10-CM

## 2017-07-04 DIAGNOSIS — Z7901 Long term (current) use of anticoagulants: Secondary | ICD-10-CM

## 2017-07-05 DIAGNOSIS — N2581 Secondary hyperparathyroidism of renal origin: Secondary | ICD-10-CM | POA: Diagnosis not present

## 2017-07-05 DIAGNOSIS — N186 End stage renal disease: Secondary | ICD-10-CM | POA: Diagnosis not present

## 2017-07-05 DIAGNOSIS — Z992 Dependence on renal dialysis: Secondary | ICD-10-CM | POA: Diagnosis not present

## 2017-07-05 DIAGNOSIS — E1129 Type 2 diabetes mellitus with other diabetic kidney complication: Secondary | ICD-10-CM | POA: Diagnosis not present

## 2017-07-05 DIAGNOSIS — D631 Anemia in chronic kidney disease: Secondary | ICD-10-CM | POA: Diagnosis not present

## 2017-07-08 DIAGNOSIS — N2581 Secondary hyperparathyroidism of renal origin: Secondary | ICD-10-CM | POA: Diagnosis not present

## 2017-07-08 DIAGNOSIS — E1129 Type 2 diabetes mellitus with other diabetic kidney complication: Secondary | ICD-10-CM | POA: Diagnosis not present

## 2017-07-08 DIAGNOSIS — Z992 Dependence on renal dialysis: Secondary | ICD-10-CM | POA: Diagnosis not present

## 2017-07-08 DIAGNOSIS — N186 End stage renal disease: Secondary | ICD-10-CM | POA: Diagnosis not present

## 2017-07-08 DIAGNOSIS — D631 Anemia in chronic kidney disease: Secondary | ICD-10-CM | POA: Diagnosis not present

## 2017-07-11 DIAGNOSIS — Z992 Dependence on renal dialysis: Secondary | ICD-10-CM | POA: Diagnosis not present

## 2017-07-11 DIAGNOSIS — N186 End stage renal disease: Secondary | ICD-10-CM | POA: Diagnosis not present

## 2017-07-11 DIAGNOSIS — E1129 Type 2 diabetes mellitus with other diabetic kidney complication: Secondary | ICD-10-CM | POA: Diagnosis not present

## 2017-07-11 DIAGNOSIS — N2581 Secondary hyperparathyroidism of renal origin: Secondary | ICD-10-CM | POA: Diagnosis not present

## 2017-07-11 DIAGNOSIS — D631 Anemia in chronic kidney disease: Secondary | ICD-10-CM | POA: Diagnosis not present

## 2017-07-12 DIAGNOSIS — N2581 Secondary hyperparathyroidism of renal origin: Secondary | ICD-10-CM | POA: Diagnosis not present

## 2017-07-12 DIAGNOSIS — Z992 Dependence on renal dialysis: Secondary | ICD-10-CM | POA: Diagnosis not present

## 2017-07-12 DIAGNOSIS — D631 Anemia in chronic kidney disease: Secondary | ICD-10-CM | POA: Diagnosis not present

## 2017-07-12 DIAGNOSIS — E1129 Type 2 diabetes mellitus with other diabetic kidney complication: Secondary | ICD-10-CM | POA: Diagnosis not present

## 2017-07-12 DIAGNOSIS — N186 End stage renal disease: Secondary | ICD-10-CM | POA: Diagnosis not present

## 2017-07-13 DIAGNOSIS — I120 Hypertensive chronic kidney disease with stage 5 chronic kidney disease or end stage renal disease: Secondary | ICD-10-CM | POA: Diagnosis not present

## 2017-07-13 DIAGNOSIS — N186 End stage renal disease: Secondary | ICD-10-CM | POA: Diagnosis not present

## 2017-07-13 DIAGNOSIS — Z992 Dependence on renal dialysis: Secondary | ICD-10-CM | POA: Diagnosis not present

## 2017-07-15 DIAGNOSIS — Z992 Dependence on renal dialysis: Secondary | ICD-10-CM | POA: Diagnosis not present

## 2017-07-15 DIAGNOSIS — D631 Anemia in chronic kidney disease: Secondary | ICD-10-CM | POA: Diagnosis not present

## 2017-07-15 DIAGNOSIS — N186 End stage renal disease: Secondary | ICD-10-CM | POA: Diagnosis not present

## 2017-07-15 DIAGNOSIS — E1129 Type 2 diabetes mellitus with other diabetic kidney complication: Secondary | ICD-10-CM | POA: Diagnosis not present

## 2017-07-15 DIAGNOSIS — N2581 Secondary hyperparathyroidism of renal origin: Secondary | ICD-10-CM | POA: Diagnosis not present

## 2017-07-17 DIAGNOSIS — E1129 Type 2 diabetes mellitus with other diabetic kidney complication: Secondary | ICD-10-CM | POA: Diagnosis not present

## 2017-07-17 DIAGNOSIS — D631 Anemia in chronic kidney disease: Secondary | ICD-10-CM | POA: Diagnosis not present

## 2017-07-17 DIAGNOSIS — N2581 Secondary hyperparathyroidism of renal origin: Secondary | ICD-10-CM | POA: Diagnosis not present

## 2017-07-17 DIAGNOSIS — N186 End stage renal disease: Secondary | ICD-10-CM | POA: Diagnosis not present

## 2017-07-17 DIAGNOSIS — Z992 Dependence on renal dialysis: Secondary | ICD-10-CM | POA: Diagnosis not present

## 2017-07-19 DIAGNOSIS — N186 End stage renal disease: Secondary | ICD-10-CM | POA: Diagnosis not present

## 2017-07-19 DIAGNOSIS — E1129 Type 2 diabetes mellitus with other diabetic kidney complication: Secondary | ICD-10-CM | POA: Diagnosis not present

## 2017-07-19 DIAGNOSIS — N2581 Secondary hyperparathyroidism of renal origin: Secondary | ICD-10-CM | POA: Diagnosis not present

## 2017-07-19 DIAGNOSIS — Z992 Dependence on renal dialysis: Secondary | ICD-10-CM | POA: Diagnosis not present

## 2017-07-19 DIAGNOSIS — D631 Anemia in chronic kidney disease: Secondary | ICD-10-CM | POA: Diagnosis not present

## 2017-07-22 DIAGNOSIS — E1129 Type 2 diabetes mellitus with other diabetic kidney complication: Secondary | ICD-10-CM | POA: Diagnosis not present

## 2017-07-22 DIAGNOSIS — N186 End stage renal disease: Secondary | ICD-10-CM | POA: Diagnosis not present

## 2017-07-22 DIAGNOSIS — D631 Anemia in chronic kidney disease: Secondary | ICD-10-CM | POA: Diagnosis not present

## 2017-07-22 DIAGNOSIS — Z992 Dependence on renal dialysis: Secondary | ICD-10-CM | POA: Diagnosis not present

## 2017-07-22 DIAGNOSIS — N2581 Secondary hyperparathyroidism of renal origin: Secondary | ICD-10-CM | POA: Diagnosis not present

## 2017-07-24 DIAGNOSIS — L309 Dermatitis, unspecified: Secondary | ICD-10-CM | POA: Diagnosis not present

## 2017-07-24 DIAGNOSIS — N186 End stage renal disease: Secondary | ICD-10-CM | POA: Diagnosis not present

## 2017-07-24 DIAGNOSIS — Z992 Dependence on renal dialysis: Secondary | ICD-10-CM | POA: Diagnosis not present

## 2017-07-24 DIAGNOSIS — L299 Pruritus, unspecified: Secondary | ICD-10-CM | POA: Diagnosis not present

## 2017-07-24 DIAGNOSIS — N2581 Secondary hyperparathyroidism of renal origin: Secondary | ICD-10-CM | POA: Diagnosis not present

## 2017-07-24 DIAGNOSIS — B356 Tinea cruris: Secondary | ICD-10-CM | POA: Diagnosis not present

## 2017-07-24 DIAGNOSIS — L709 Acne, unspecified: Secondary | ICD-10-CM | POA: Diagnosis not present

## 2017-07-24 DIAGNOSIS — D631 Anemia in chronic kidney disease: Secondary | ICD-10-CM | POA: Diagnosis not present

## 2017-07-24 DIAGNOSIS — E1129 Type 2 diabetes mellitus with other diabetic kidney complication: Secondary | ICD-10-CM | POA: Diagnosis not present

## 2017-07-26 DIAGNOSIS — Z992 Dependence on renal dialysis: Secondary | ICD-10-CM | POA: Diagnosis not present

## 2017-07-26 DIAGNOSIS — N2581 Secondary hyperparathyroidism of renal origin: Secondary | ICD-10-CM | POA: Diagnosis not present

## 2017-07-26 DIAGNOSIS — E1129 Type 2 diabetes mellitus with other diabetic kidney complication: Secondary | ICD-10-CM | POA: Diagnosis not present

## 2017-07-26 DIAGNOSIS — N186 End stage renal disease: Secondary | ICD-10-CM | POA: Diagnosis not present

## 2017-07-26 DIAGNOSIS — D631 Anemia in chronic kidney disease: Secondary | ICD-10-CM | POA: Diagnosis not present

## 2017-07-29 DIAGNOSIS — D631 Anemia in chronic kidney disease: Secondary | ICD-10-CM | POA: Diagnosis not present

## 2017-07-29 DIAGNOSIS — Z992 Dependence on renal dialysis: Secondary | ICD-10-CM | POA: Diagnosis not present

## 2017-07-29 DIAGNOSIS — E1129 Type 2 diabetes mellitus with other diabetic kidney complication: Secondary | ICD-10-CM | POA: Diagnosis not present

## 2017-07-29 DIAGNOSIS — N186 End stage renal disease: Secondary | ICD-10-CM | POA: Diagnosis not present

## 2017-07-29 DIAGNOSIS — N2581 Secondary hyperparathyroidism of renal origin: Secondary | ICD-10-CM | POA: Diagnosis not present

## 2017-07-31 DIAGNOSIS — D631 Anemia in chronic kidney disease: Secondary | ICD-10-CM | POA: Diagnosis not present

## 2017-07-31 DIAGNOSIS — N186 End stage renal disease: Secondary | ICD-10-CM | POA: Diagnosis not present

## 2017-07-31 DIAGNOSIS — E1129 Type 2 diabetes mellitus with other diabetic kidney complication: Secondary | ICD-10-CM | POA: Diagnosis not present

## 2017-07-31 DIAGNOSIS — Z992 Dependence on renal dialysis: Secondary | ICD-10-CM | POA: Diagnosis not present

## 2017-07-31 DIAGNOSIS — N2581 Secondary hyperparathyroidism of renal origin: Secondary | ICD-10-CM | POA: Diagnosis not present

## 2017-08-02 DIAGNOSIS — D631 Anemia in chronic kidney disease: Secondary | ICD-10-CM | POA: Diagnosis not present

## 2017-08-02 DIAGNOSIS — N2581 Secondary hyperparathyroidism of renal origin: Secondary | ICD-10-CM | POA: Diagnosis not present

## 2017-08-02 DIAGNOSIS — E1129 Type 2 diabetes mellitus with other diabetic kidney complication: Secondary | ICD-10-CM | POA: Diagnosis not present

## 2017-08-02 DIAGNOSIS — Z992 Dependence on renal dialysis: Secondary | ICD-10-CM | POA: Diagnosis not present

## 2017-08-02 DIAGNOSIS — N186 End stage renal disease: Secondary | ICD-10-CM | POA: Diagnosis not present

## 2017-08-05 DIAGNOSIS — E1129 Type 2 diabetes mellitus with other diabetic kidney complication: Secondary | ICD-10-CM | POA: Diagnosis not present

## 2017-08-05 DIAGNOSIS — N2581 Secondary hyperparathyroidism of renal origin: Secondary | ICD-10-CM | POA: Diagnosis not present

## 2017-08-05 DIAGNOSIS — N186 End stage renal disease: Secondary | ICD-10-CM | POA: Diagnosis not present

## 2017-08-05 DIAGNOSIS — Z992 Dependence on renal dialysis: Secondary | ICD-10-CM | POA: Diagnosis not present

## 2017-08-05 DIAGNOSIS — D631 Anemia in chronic kidney disease: Secondary | ICD-10-CM | POA: Diagnosis not present

## 2017-08-07 DIAGNOSIS — E1129 Type 2 diabetes mellitus with other diabetic kidney complication: Secondary | ICD-10-CM | POA: Diagnosis not present

## 2017-08-07 DIAGNOSIS — N2581 Secondary hyperparathyroidism of renal origin: Secondary | ICD-10-CM | POA: Diagnosis not present

## 2017-08-07 DIAGNOSIS — Z992 Dependence on renal dialysis: Secondary | ICD-10-CM | POA: Diagnosis not present

## 2017-08-07 DIAGNOSIS — D631 Anemia in chronic kidney disease: Secondary | ICD-10-CM | POA: Diagnosis not present

## 2017-08-07 DIAGNOSIS — N186 End stage renal disease: Secondary | ICD-10-CM | POA: Diagnosis not present

## 2017-08-07 DIAGNOSIS — H05012 Cellulitis of left orbit: Secondary | ICD-10-CM | POA: Diagnosis not present

## 2017-08-09 DIAGNOSIS — N2581 Secondary hyperparathyroidism of renal origin: Secondary | ICD-10-CM | POA: Diagnosis not present

## 2017-08-09 DIAGNOSIS — E1129 Type 2 diabetes mellitus with other diabetic kidney complication: Secondary | ICD-10-CM | POA: Diagnosis not present

## 2017-08-09 DIAGNOSIS — D631 Anemia in chronic kidney disease: Secondary | ICD-10-CM | POA: Diagnosis not present

## 2017-08-09 DIAGNOSIS — N186 End stage renal disease: Secondary | ICD-10-CM | POA: Diagnosis not present

## 2017-08-09 DIAGNOSIS — Z992 Dependence on renal dialysis: Secondary | ICD-10-CM | POA: Diagnosis not present

## 2017-08-12 DIAGNOSIS — N2581 Secondary hyperparathyroidism of renal origin: Secondary | ICD-10-CM | POA: Diagnosis not present

## 2017-08-12 DIAGNOSIS — N186 End stage renal disease: Secondary | ICD-10-CM | POA: Diagnosis not present

## 2017-08-12 DIAGNOSIS — Z992 Dependence on renal dialysis: Secondary | ICD-10-CM | POA: Diagnosis not present

## 2017-08-12 DIAGNOSIS — E1129 Type 2 diabetes mellitus with other diabetic kidney complication: Secondary | ICD-10-CM | POA: Diagnosis not present

## 2017-08-12 DIAGNOSIS — D631 Anemia in chronic kidney disease: Secondary | ICD-10-CM | POA: Diagnosis not present

## 2017-08-12 DIAGNOSIS — I120 Hypertensive chronic kidney disease with stage 5 chronic kidney disease or end stage renal disease: Secondary | ICD-10-CM | POA: Diagnosis not present

## 2017-08-14 DIAGNOSIS — E1129 Type 2 diabetes mellitus with other diabetic kidney complication: Secondary | ICD-10-CM | POA: Diagnosis not present

## 2017-08-14 DIAGNOSIS — D631 Anemia in chronic kidney disease: Secondary | ICD-10-CM | POA: Diagnosis not present

## 2017-08-14 DIAGNOSIS — N186 End stage renal disease: Secondary | ICD-10-CM | POA: Diagnosis not present

## 2017-08-14 DIAGNOSIS — Z992 Dependence on renal dialysis: Secondary | ICD-10-CM | POA: Diagnosis not present

## 2017-08-14 DIAGNOSIS — N2581 Secondary hyperparathyroidism of renal origin: Secondary | ICD-10-CM | POA: Diagnosis not present

## 2017-08-16 DIAGNOSIS — Z992 Dependence on renal dialysis: Secondary | ICD-10-CM | POA: Diagnosis not present

## 2017-08-16 DIAGNOSIS — N2581 Secondary hyperparathyroidism of renal origin: Secondary | ICD-10-CM | POA: Diagnosis not present

## 2017-08-16 DIAGNOSIS — D631 Anemia in chronic kidney disease: Secondary | ICD-10-CM | POA: Diagnosis not present

## 2017-08-16 DIAGNOSIS — E1129 Type 2 diabetes mellitus with other diabetic kidney complication: Secondary | ICD-10-CM | POA: Diagnosis not present

## 2017-08-16 DIAGNOSIS — N186 End stage renal disease: Secondary | ICD-10-CM | POA: Diagnosis not present

## 2017-08-19 DIAGNOSIS — N186 End stage renal disease: Secondary | ICD-10-CM | POA: Diagnosis not present

## 2017-08-19 DIAGNOSIS — E1129 Type 2 diabetes mellitus with other diabetic kidney complication: Secondary | ICD-10-CM | POA: Diagnosis not present

## 2017-08-19 DIAGNOSIS — Z992 Dependence on renal dialysis: Secondary | ICD-10-CM | POA: Diagnosis not present

## 2017-08-19 DIAGNOSIS — D631 Anemia in chronic kidney disease: Secondary | ICD-10-CM | POA: Diagnosis not present

## 2017-08-19 DIAGNOSIS — N2581 Secondary hyperparathyroidism of renal origin: Secondary | ICD-10-CM | POA: Diagnosis not present

## 2017-08-20 DIAGNOSIS — E1351 Other specified diabetes mellitus with diabetic peripheral angiopathy without gangrene: Secondary | ICD-10-CM | POA: Diagnosis not present

## 2017-08-20 DIAGNOSIS — L602 Onychogryphosis: Secondary | ICD-10-CM | POA: Diagnosis not present

## 2017-08-21 DIAGNOSIS — Z992 Dependence on renal dialysis: Secondary | ICD-10-CM | POA: Diagnosis not present

## 2017-08-21 DIAGNOSIS — L730 Acne keloid: Secondary | ICD-10-CM | POA: Diagnosis not present

## 2017-08-21 DIAGNOSIS — N2581 Secondary hyperparathyroidism of renal origin: Secondary | ICD-10-CM | POA: Diagnosis not present

## 2017-08-21 DIAGNOSIS — D631 Anemia in chronic kidney disease: Secondary | ICD-10-CM | POA: Diagnosis not present

## 2017-08-21 DIAGNOSIS — N186 End stage renal disease: Secondary | ICD-10-CM | POA: Diagnosis not present

## 2017-08-21 DIAGNOSIS — E1129 Type 2 diabetes mellitus with other diabetic kidney complication: Secondary | ICD-10-CM | POA: Diagnosis not present

## 2017-08-21 DIAGNOSIS — L700 Acne vulgaris: Secondary | ICD-10-CM | POA: Diagnosis not present

## 2017-08-22 DIAGNOSIS — Z96652 Presence of left artificial knee joint: Secondary | ICD-10-CM | POA: Diagnosis not present

## 2017-08-22 DIAGNOSIS — Z09 Encounter for follow-up examination after completed treatment for conditions other than malignant neoplasm: Secondary | ICD-10-CM | POA: Diagnosis not present

## 2017-08-22 DIAGNOSIS — Z96651 Presence of right artificial knee joint: Secondary | ICD-10-CM | POA: Diagnosis not present

## 2017-08-22 DIAGNOSIS — M25561 Pain in right knee: Secondary | ICD-10-CM | POA: Diagnosis not present

## 2017-08-22 DIAGNOSIS — M75121 Complete rotator cuff tear or rupture of right shoulder, not specified as traumatic: Secondary | ICD-10-CM | POA: Diagnosis not present

## 2017-08-23 DIAGNOSIS — E1129 Type 2 diabetes mellitus with other diabetic kidney complication: Secondary | ICD-10-CM | POA: Diagnosis not present

## 2017-08-23 DIAGNOSIS — N2581 Secondary hyperparathyroidism of renal origin: Secondary | ICD-10-CM | POA: Diagnosis not present

## 2017-08-23 DIAGNOSIS — N186 End stage renal disease: Secondary | ICD-10-CM | POA: Diagnosis not present

## 2017-08-23 DIAGNOSIS — D631 Anemia in chronic kidney disease: Secondary | ICD-10-CM | POA: Diagnosis not present

## 2017-08-23 DIAGNOSIS — Z992 Dependence on renal dialysis: Secondary | ICD-10-CM | POA: Diagnosis not present

## 2017-08-26 DIAGNOSIS — D631 Anemia in chronic kidney disease: Secondary | ICD-10-CM | POA: Diagnosis not present

## 2017-08-26 DIAGNOSIS — N186 End stage renal disease: Secondary | ICD-10-CM | POA: Diagnosis not present

## 2017-08-26 DIAGNOSIS — E1129 Type 2 diabetes mellitus with other diabetic kidney complication: Secondary | ICD-10-CM | POA: Diagnosis not present

## 2017-08-26 DIAGNOSIS — Z992 Dependence on renal dialysis: Secondary | ICD-10-CM | POA: Diagnosis not present

## 2017-08-26 DIAGNOSIS — N2581 Secondary hyperparathyroidism of renal origin: Secondary | ICD-10-CM | POA: Diagnosis not present

## 2017-08-28 DIAGNOSIS — Z992 Dependence on renal dialysis: Secondary | ICD-10-CM | POA: Diagnosis not present

## 2017-08-28 DIAGNOSIS — D631 Anemia in chronic kidney disease: Secondary | ICD-10-CM | POA: Diagnosis not present

## 2017-08-28 DIAGNOSIS — E1129 Type 2 diabetes mellitus with other diabetic kidney complication: Secondary | ICD-10-CM | POA: Diagnosis not present

## 2017-08-28 DIAGNOSIS — N2581 Secondary hyperparathyroidism of renal origin: Secondary | ICD-10-CM | POA: Diagnosis not present

## 2017-08-28 DIAGNOSIS — N186 End stage renal disease: Secondary | ICD-10-CM | POA: Diagnosis not present

## 2017-08-30 DIAGNOSIS — E1129 Type 2 diabetes mellitus with other diabetic kidney complication: Secondary | ICD-10-CM | POA: Diagnosis not present

## 2017-08-30 DIAGNOSIS — N186 End stage renal disease: Secondary | ICD-10-CM | POA: Diagnosis not present

## 2017-08-30 DIAGNOSIS — Z992 Dependence on renal dialysis: Secondary | ICD-10-CM | POA: Diagnosis not present

## 2017-08-30 DIAGNOSIS — N2581 Secondary hyperparathyroidism of renal origin: Secondary | ICD-10-CM | POA: Diagnosis not present

## 2017-08-30 DIAGNOSIS — D631 Anemia in chronic kidney disease: Secondary | ICD-10-CM | POA: Diagnosis not present

## 2017-09-02 DIAGNOSIS — E1129 Type 2 diabetes mellitus with other diabetic kidney complication: Secondary | ICD-10-CM | POA: Diagnosis not present

## 2017-09-02 DIAGNOSIS — N186 End stage renal disease: Secondary | ICD-10-CM | POA: Diagnosis not present

## 2017-09-02 DIAGNOSIS — D631 Anemia in chronic kidney disease: Secondary | ICD-10-CM | POA: Diagnosis not present

## 2017-09-02 DIAGNOSIS — N2581 Secondary hyperparathyroidism of renal origin: Secondary | ICD-10-CM | POA: Diagnosis not present

## 2017-09-02 DIAGNOSIS — Z992 Dependence on renal dialysis: Secondary | ICD-10-CM | POA: Diagnosis not present

## 2017-09-03 DIAGNOSIS — R600 Localized edema: Secondary | ICD-10-CM | POA: Diagnosis not present

## 2017-09-03 DIAGNOSIS — E8770 Fluid overload, unspecified: Secondary | ICD-10-CM | POA: Diagnosis not present

## 2017-09-03 DIAGNOSIS — N2581 Secondary hyperparathyroidism of renal origin: Secondary | ICD-10-CM | POA: Diagnosis not present

## 2017-09-03 DIAGNOSIS — N186 End stage renal disease: Secondary | ICD-10-CM | POA: Diagnosis not present

## 2017-09-04 DIAGNOSIS — E1129 Type 2 diabetes mellitus with other diabetic kidney complication: Secondary | ICD-10-CM | POA: Diagnosis not present

## 2017-09-04 DIAGNOSIS — N2581 Secondary hyperparathyroidism of renal origin: Secondary | ICD-10-CM | POA: Diagnosis not present

## 2017-09-04 DIAGNOSIS — D631 Anemia in chronic kidney disease: Secondary | ICD-10-CM | POA: Diagnosis not present

## 2017-09-04 DIAGNOSIS — Z992 Dependence on renal dialysis: Secondary | ICD-10-CM | POA: Diagnosis not present

## 2017-09-04 DIAGNOSIS — N186 End stage renal disease: Secondary | ICD-10-CM | POA: Diagnosis not present

## 2017-09-05 DIAGNOSIS — T82858A Stenosis of vascular prosthetic devices, implants and grafts, initial encounter: Secondary | ICD-10-CM | POA: Diagnosis not present

## 2017-09-05 DIAGNOSIS — I871 Compression of vein: Secondary | ICD-10-CM | POA: Diagnosis not present

## 2017-09-05 DIAGNOSIS — Z992 Dependence on renal dialysis: Secondary | ICD-10-CM | POA: Diagnosis not present

## 2017-09-05 DIAGNOSIS — N186 End stage renal disease: Secondary | ICD-10-CM | POA: Diagnosis not present

## 2017-09-06 DIAGNOSIS — N2581 Secondary hyperparathyroidism of renal origin: Secondary | ICD-10-CM | POA: Diagnosis not present

## 2017-09-06 DIAGNOSIS — E1129 Type 2 diabetes mellitus with other diabetic kidney complication: Secondary | ICD-10-CM | POA: Diagnosis not present

## 2017-09-06 DIAGNOSIS — D631 Anemia in chronic kidney disease: Secondary | ICD-10-CM | POA: Diagnosis not present

## 2017-09-06 DIAGNOSIS — Z992 Dependence on renal dialysis: Secondary | ICD-10-CM | POA: Diagnosis not present

## 2017-09-06 DIAGNOSIS — N186 End stage renal disease: Secondary | ICD-10-CM | POA: Diagnosis not present

## 2017-09-09 DIAGNOSIS — Z992 Dependence on renal dialysis: Secondary | ICD-10-CM | POA: Diagnosis not present

## 2017-09-09 DIAGNOSIS — D631 Anemia in chronic kidney disease: Secondary | ICD-10-CM | POA: Diagnosis not present

## 2017-09-09 DIAGNOSIS — N186 End stage renal disease: Secondary | ICD-10-CM | POA: Diagnosis not present

## 2017-09-09 DIAGNOSIS — N2581 Secondary hyperparathyroidism of renal origin: Secondary | ICD-10-CM | POA: Diagnosis not present

## 2017-09-09 DIAGNOSIS — E1129 Type 2 diabetes mellitus with other diabetic kidney complication: Secondary | ICD-10-CM | POA: Diagnosis not present

## 2017-09-10 DIAGNOSIS — N5201 Erectile dysfunction due to arterial insufficiency: Secondary | ICD-10-CM | POA: Diagnosis not present

## 2017-09-11 DIAGNOSIS — D631 Anemia in chronic kidney disease: Secondary | ICD-10-CM | POA: Diagnosis not present

## 2017-09-11 DIAGNOSIS — Z992 Dependence on renal dialysis: Secondary | ICD-10-CM | POA: Diagnosis not present

## 2017-09-11 DIAGNOSIS — E1129 Type 2 diabetes mellitus with other diabetic kidney complication: Secondary | ICD-10-CM | POA: Diagnosis not present

## 2017-09-11 DIAGNOSIS — N186 End stage renal disease: Secondary | ICD-10-CM | POA: Diagnosis not present

## 2017-09-11 DIAGNOSIS — N2581 Secondary hyperparathyroidism of renal origin: Secondary | ICD-10-CM | POA: Diagnosis not present

## 2017-09-12 DIAGNOSIS — I120 Hypertensive chronic kidney disease with stage 5 chronic kidney disease or end stage renal disease: Secondary | ICD-10-CM | POA: Diagnosis not present

## 2017-09-12 DIAGNOSIS — N186 End stage renal disease: Secondary | ICD-10-CM | POA: Diagnosis not present

## 2017-09-12 DIAGNOSIS — Z992 Dependence on renal dialysis: Secondary | ICD-10-CM | POA: Diagnosis not present

## 2017-09-13 DIAGNOSIS — D631 Anemia in chronic kidney disease: Secondary | ICD-10-CM | POA: Diagnosis not present

## 2017-09-13 DIAGNOSIS — Z992 Dependence on renal dialysis: Secondary | ICD-10-CM | POA: Diagnosis not present

## 2017-09-13 DIAGNOSIS — E1129 Type 2 diabetes mellitus with other diabetic kidney complication: Secondary | ICD-10-CM | POA: Diagnosis not present

## 2017-09-13 DIAGNOSIS — N2581 Secondary hyperparathyroidism of renal origin: Secondary | ICD-10-CM | POA: Diagnosis not present

## 2017-09-13 DIAGNOSIS — N186 End stage renal disease: Secondary | ICD-10-CM | POA: Diagnosis not present

## 2017-09-16 DIAGNOSIS — N186 End stage renal disease: Secondary | ICD-10-CM | POA: Diagnosis not present

## 2017-09-16 DIAGNOSIS — N2581 Secondary hyperparathyroidism of renal origin: Secondary | ICD-10-CM | POA: Diagnosis not present

## 2017-09-16 DIAGNOSIS — E1129 Type 2 diabetes mellitus with other diabetic kidney complication: Secondary | ICD-10-CM | POA: Diagnosis not present

## 2017-09-16 DIAGNOSIS — D631 Anemia in chronic kidney disease: Secondary | ICD-10-CM | POA: Diagnosis not present

## 2017-09-16 DIAGNOSIS — Z992 Dependence on renal dialysis: Secondary | ICD-10-CM | POA: Diagnosis not present

## 2017-09-18 DIAGNOSIS — N186 End stage renal disease: Secondary | ICD-10-CM | POA: Diagnosis not present

## 2017-09-18 DIAGNOSIS — D631 Anemia in chronic kidney disease: Secondary | ICD-10-CM | POA: Diagnosis not present

## 2017-09-18 DIAGNOSIS — N2581 Secondary hyperparathyroidism of renal origin: Secondary | ICD-10-CM | POA: Diagnosis not present

## 2017-09-18 DIAGNOSIS — E1129 Type 2 diabetes mellitus with other diabetic kidney complication: Secondary | ICD-10-CM | POA: Diagnosis not present

## 2017-09-18 DIAGNOSIS — Z992 Dependence on renal dialysis: Secondary | ICD-10-CM | POA: Diagnosis not present

## 2017-09-20 DIAGNOSIS — N186 End stage renal disease: Secondary | ICD-10-CM | POA: Diagnosis not present

## 2017-09-20 DIAGNOSIS — Z992 Dependence on renal dialysis: Secondary | ICD-10-CM | POA: Diagnosis not present

## 2017-09-20 DIAGNOSIS — D631 Anemia in chronic kidney disease: Secondary | ICD-10-CM | POA: Diagnosis not present

## 2017-09-20 DIAGNOSIS — E1129 Type 2 diabetes mellitus with other diabetic kidney complication: Secondary | ICD-10-CM | POA: Diagnosis not present

## 2017-09-20 DIAGNOSIS — N2581 Secondary hyperparathyroidism of renal origin: Secondary | ICD-10-CM | POA: Diagnosis not present

## 2017-09-23 DIAGNOSIS — D631 Anemia in chronic kidney disease: Secondary | ICD-10-CM | POA: Diagnosis not present

## 2017-09-23 DIAGNOSIS — N2581 Secondary hyperparathyroidism of renal origin: Secondary | ICD-10-CM | POA: Diagnosis not present

## 2017-09-23 DIAGNOSIS — Z992 Dependence on renal dialysis: Secondary | ICD-10-CM | POA: Diagnosis not present

## 2017-09-23 DIAGNOSIS — E1129 Type 2 diabetes mellitus with other diabetic kidney complication: Secondary | ICD-10-CM | POA: Diagnosis not present

## 2017-09-23 DIAGNOSIS — N186 End stage renal disease: Secondary | ICD-10-CM | POA: Diagnosis not present

## 2017-09-25 DIAGNOSIS — Z992 Dependence on renal dialysis: Secondary | ICD-10-CM | POA: Diagnosis not present

## 2017-09-25 DIAGNOSIS — D631 Anemia in chronic kidney disease: Secondary | ICD-10-CM | POA: Diagnosis not present

## 2017-09-25 DIAGNOSIS — E1129 Type 2 diabetes mellitus with other diabetic kidney complication: Secondary | ICD-10-CM | POA: Diagnosis not present

## 2017-09-25 DIAGNOSIS — N186 End stage renal disease: Secondary | ICD-10-CM | POA: Diagnosis not present

## 2017-09-25 DIAGNOSIS — N2581 Secondary hyperparathyroidism of renal origin: Secondary | ICD-10-CM | POA: Diagnosis not present

## 2017-09-27 DIAGNOSIS — E1129 Type 2 diabetes mellitus with other diabetic kidney complication: Secondary | ICD-10-CM | POA: Diagnosis not present

## 2017-09-27 DIAGNOSIS — N186 End stage renal disease: Secondary | ICD-10-CM | POA: Diagnosis not present

## 2017-09-27 DIAGNOSIS — D631 Anemia in chronic kidney disease: Secondary | ICD-10-CM | POA: Diagnosis not present

## 2017-09-27 DIAGNOSIS — N2581 Secondary hyperparathyroidism of renal origin: Secondary | ICD-10-CM | POA: Diagnosis not present

## 2017-09-27 DIAGNOSIS — Z992 Dependence on renal dialysis: Secondary | ICD-10-CM | POA: Diagnosis not present

## 2017-09-30 DIAGNOSIS — D631 Anemia in chronic kidney disease: Secondary | ICD-10-CM | POA: Diagnosis not present

## 2017-09-30 DIAGNOSIS — Z992 Dependence on renal dialysis: Secondary | ICD-10-CM | POA: Diagnosis not present

## 2017-09-30 DIAGNOSIS — N186 End stage renal disease: Secondary | ICD-10-CM | POA: Diagnosis not present

## 2017-09-30 DIAGNOSIS — N2581 Secondary hyperparathyroidism of renal origin: Secondary | ICD-10-CM | POA: Diagnosis not present

## 2017-09-30 DIAGNOSIS — E1129 Type 2 diabetes mellitus with other diabetic kidney complication: Secondary | ICD-10-CM | POA: Diagnosis not present

## 2017-10-02 DIAGNOSIS — N186 End stage renal disease: Secondary | ICD-10-CM | POA: Diagnosis not present

## 2017-10-02 DIAGNOSIS — E1129 Type 2 diabetes mellitus with other diabetic kidney complication: Secondary | ICD-10-CM | POA: Diagnosis not present

## 2017-10-02 DIAGNOSIS — N2581 Secondary hyperparathyroidism of renal origin: Secondary | ICD-10-CM | POA: Diagnosis not present

## 2017-10-02 DIAGNOSIS — D631 Anemia in chronic kidney disease: Secondary | ICD-10-CM | POA: Diagnosis not present

## 2017-10-02 DIAGNOSIS — Z992 Dependence on renal dialysis: Secondary | ICD-10-CM | POA: Diagnosis not present

## 2017-10-04 DIAGNOSIS — D631 Anemia in chronic kidney disease: Secondary | ICD-10-CM | POA: Diagnosis not present

## 2017-10-04 DIAGNOSIS — E1129 Type 2 diabetes mellitus with other diabetic kidney complication: Secondary | ICD-10-CM | POA: Diagnosis not present

## 2017-10-04 DIAGNOSIS — N2581 Secondary hyperparathyroidism of renal origin: Secondary | ICD-10-CM | POA: Diagnosis not present

## 2017-10-04 DIAGNOSIS — N186 End stage renal disease: Secondary | ICD-10-CM | POA: Diagnosis not present

## 2017-10-04 DIAGNOSIS — Z992 Dependence on renal dialysis: Secondary | ICD-10-CM | POA: Diagnosis not present

## 2017-10-08 ENCOUNTER — Ambulatory Visit: Payer: Medicare Other | Admitting: Internal Medicine

## 2017-10-09 DIAGNOSIS — D631 Anemia in chronic kidney disease: Secondary | ICD-10-CM | POA: Diagnosis not present

## 2017-10-09 DIAGNOSIS — E1129 Type 2 diabetes mellitus with other diabetic kidney complication: Secondary | ICD-10-CM | POA: Diagnosis not present

## 2017-10-09 DIAGNOSIS — N186 End stage renal disease: Secondary | ICD-10-CM | POA: Diagnosis not present

## 2017-10-09 DIAGNOSIS — Z992 Dependence on renal dialysis: Secondary | ICD-10-CM | POA: Diagnosis not present

## 2017-10-09 DIAGNOSIS — N2581 Secondary hyperparathyroidism of renal origin: Secondary | ICD-10-CM | POA: Diagnosis not present

## 2017-10-11 DIAGNOSIS — D631 Anemia in chronic kidney disease: Secondary | ICD-10-CM | POA: Diagnosis not present

## 2017-10-11 DIAGNOSIS — Z992 Dependence on renal dialysis: Secondary | ICD-10-CM | POA: Diagnosis not present

## 2017-10-11 DIAGNOSIS — N2581 Secondary hyperparathyroidism of renal origin: Secondary | ICD-10-CM | POA: Diagnosis not present

## 2017-10-11 DIAGNOSIS — E1129 Type 2 diabetes mellitus with other diabetic kidney complication: Secondary | ICD-10-CM | POA: Diagnosis not present

## 2017-10-11 DIAGNOSIS — N186 End stage renal disease: Secondary | ICD-10-CM | POA: Diagnosis not present

## 2017-10-13 DIAGNOSIS — Z992 Dependence on renal dialysis: Secondary | ICD-10-CM | POA: Diagnosis not present

## 2017-10-13 DIAGNOSIS — N186 End stage renal disease: Secondary | ICD-10-CM | POA: Diagnosis not present

## 2017-10-13 DIAGNOSIS — I120 Hypertensive chronic kidney disease with stage 5 chronic kidney disease or end stage renal disease: Secondary | ICD-10-CM | POA: Diagnosis not present

## 2017-10-14 DIAGNOSIS — D631 Anemia in chronic kidney disease: Secondary | ICD-10-CM | POA: Diagnosis not present

## 2017-10-14 DIAGNOSIS — E875 Hyperkalemia: Secondary | ICD-10-CM | POA: Diagnosis not present

## 2017-10-14 DIAGNOSIS — Z23 Encounter for immunization: Secondary | ICD-10-CM | POA: Diagnosis not present

## 2017-10-14 DIAGNOSIS — N186 End stage renal disease: Secondary | ICD-10-CM | POA: Diagnosis not present

## 2017-10-14 DIAGNOSIS — N2581 Secondary hyperparathyroidism of renal origin: Secondary | ICD-10-CM | POA: Diagnosis not present

## 2017-10-14 DIAGNOSIS — Z992 Dependence on renal dialysis: Secondary | ICD-10-CM | POA: Diagnosis not present

## 2017-10-16 DIAGNOSIS — N2581 Secondary hyperparathyroidism of renal origin: Secondary | ICD-10-CM | POA: Diagnosis not present

## 2017-10-16 DIAGNOSIS — Z992 Dependence on renal dialysis: Secondary | ICD-10-CM | POA: Diagnosis not present

## 2017-10-16 DIAGNOSIS — E875 Hyperkalemia: Secondary | ICD-10-CM | POA: Diagnosis not present

## 2017-10-16 DIAGNOSIS — D631 Anemia in chronic kidney disease: Secondary | ICD-10-CM | POA: Diagnosis not present

## 2017-10-16 DIAGNOSIS — Z23 Encounter for immunization: Secondary | ICD-10-CM | POA: Diagnosis not present

## 2017-10-16 DIAGNOSIS — N186 End stage renal disease: Secondary | ICD-10-CM | POA: Diagnosis not present

## 2017-10-18 DIAGNOSIS — E875 Hyperkalemia: Secondary | ICD-10-CM | POA: Diagnosis not present

## 2017-10-18 DIAGNOSIS — N2581 Secondary hyperparathyroidism of renal origin: Secondary | ICD-10-CM | POA: Diagnosis not present

## 2017-10-18 DIAGNOSIS — N186 End stage renal disease: Secondary | ICD-10-CM | POA: Diagnosis not present

## 2017-10-18 DIAGNOSIS — D631 Anemia in chronic kidney disease: Secondary | ICD-10-CM | POA: Diagnosis not present

## 2017-10-18 DIAGNOSIS — Z992 Dependence on renal dialysis: Secondary | ICD-10-CM | POA: Diagnosis not present

## 2017-10-18 DIAGNOSIS — Z23 Encounter for immunization: Secondary | ICD-10-CM | POA: Diagnosis not present

## 2017-10-19 DIAGNOSIS — E8779 Other fluid overload: Secondary | ICD-10-CM | POA: Diagnosis not present

## 2017-10-19 DIAGNOSIS — N2581 Secondary hyperparathyroidism of renal origin: Secondary | ICD-10-CM | POA: Diagnosis not present

## 2017-10-19 DIAGNOSIS — N186 End stage renal disease: Secondary | ICD-10-CM | POA: Diagnosis not present

## 2017-10-21 DIAGNOSIS — Z23 Encounter for immunization: Secondary | ICD-10-CM | POA: Diagnosis not present

## 2017-10-21 DIAGNOSIS — N2581 Secondary hyperparathyroidism of renal origin: Secondary | ICD-10-CM | POA: Diagnosis not present

## 2017-10-21 DIAGNOSIS — E875 Hyperkalemia: Secondary | ICD-10-CM | POA: Diagnosis not present

## 2017-10-21 DIAGNOSIS — Z992 Dependence on renal dialysis: Secondary | ICD-10-CM | POA: Diagnosis not present

## 2017-10-21 DIAGNOSIS — N186 End stage renal disease: Secondary | ICD-10-CM | POA: Diagnosis not present

## 2017-10-21 DIAGNOSIS — D631 Anemia in chronic kidney disease: Secondary | ICD-10-CM | POA: Diagnosis not present

## 2017-10-24 DIAGNOSIS — D631 Anemia in chronic kidney disease: Secondary | ICD-10-CM | POA: Diagnosis not present

## 2017-10-24 DIAGNOSIS — N2581 Secondary hyperparathyroidism of renal origin: Secondary | ICD-10-CM | POA: Diagnosis not present

## 2017-10-24 DIAGNOSIS — Z992 Dependence on renal dialysis: Secondary | ICD-10-CM | POA: Diagnosis not present

## 2017-10-24 DIAGNOSIS — E875 Hyperkalemia: Secondary | ICD-10-CM | POA: Diagnosis not present

## 2017-10-24 DIAGNOSIS — Z23 Encounter for immunization: Secondary | ICD-10-CM | POA: Diagnosis not present

## 2017-10-24 DIAGNOSIS — N186 End stage renal disease: Secondary | ICD-10-CM | POA: Diagnosis not present

## 2017-10-25 DIAGNOSIS — N2581 Secondary hyperparathyroidism of renal origin: Secondary | ICD-10-CM | POA: Diagnosis not present

## 2017-10-25 DIAGNOSIS — E875 Hyperkalemia: Secondary | ICD-10-CM | POA: Diagnosis not present

## 2017-10-25 DIAGNOSIS — Z23 Encounter for immunization: Secondary | ICD-10-CM | POA: Diagnosis not present

## 2017-10-25 DIAGNOSIS — D631 Anemia in chronic kidney disease: Secondary | ICD-10-CM | POA: Diagnosis not present

## 2017-10-25 DIAGNOSIS — Z992 Dependence on renal dialysis: Secondary | ICD-10-CM | POA: Diagnosis not present

## 2017-10-25 DIAGNOSIS — N186 End stage renal disease: Secondary | ICD-10-CM | POA: Diagnosis not present

## 2017-10-28 DIAGNOSIS — F5221 Male erectile disorder: Secondary | ICD-10-CM | POA: Diagnosis not present

## 2017-10-28 DIAGNOSIS — E7800 Pure hypercholesterolemia, unspecified: Secondary | ICD-10-CM | POA: Diagnosis not present

## 2017-10-28 DIAGNOSIS — I482 Chronic atrial fibrillation: Secondary | ICD-10-CM | POA: Diagnosis not present

## 2017-10-28 DIAGNOSIS — I10 Essential (primary) hypertension: Secondary | ICD-10-CM | POA: Diagnosis not present

## 2017-10-28 DIAGNOSIS — D631 Anemia in chronic kidney disease: Secondary | ICD-10-CM | POA: Diagnosis not present

## 2017-10-28 DIAGNOSIS — Z79899 Other long term (current) drug therapy: Secondary | ICD-10-CM | POA: Diagnosis not present

## 2017-10-28 DIAGNOSIS — E875 Hyperkalemia: Secondary | ICD-10-CM | POA: Diagnosis not present

## 2017-10-28 DIAGNOSIS — I5020 Unspecified systolic (congestive) heart failure: Secondary | ICD-10-CM | POA: Diagnosis not present

## 2017-10-28 DIAGNOSIS — N186 End stage renal disease: Secondary | ICD-10-CM | POA: Diagnosis not present

## 2017-10-28 DIAGNOSIS — Z0001 Encounter for general adult medical examination with abnormal findings: Secondary | ICD-10-CM | POA: Diagnosis not present

## 2017-10-28 DIAGNOSIS — Z23 Encounter for immunization: Secondary | ICD-10-CM | POA: Diagnosis not present

## 2017-10-28 DIAGNOSIS — M109 Gout, unspecified: Secondary | ICD-10-CM | POA: Diagnosis not present

## 2017-10-28 DIAGNOSIS — Z992 Dependence on renal dialysis: Secondary | ICD-10-CM | POA: Diagnosis not present

## 2017-10-28 DIAGNOSIS — N2581 Secondary hyperparathyroidism of renal origin: Secondary | ICD-10-CM | POA: Diagnosis not present

## 2017-10-28 DIAGNOSIS — M1711 Unilateral primary osteoarthritis, right knee: Secondary | ICD-10-CM | POA: Diagnosis not present

## 2017-10-28 DIAGNOSIS — R7303 Prediabetes: Secondary | ICD-10-CM | POA: Diagnosis not present

## 2017-10-29 DIAGNOSIS — L700 Acne vulgaris: Secondary | ICD-10-CM | POA: Diagnosis not present

## 2017-10-29 DIAGNOSIS — L738 Other specified follicular disorders: Secondary | ICD-10-CM | POA: Diagnosis not present

## 2017-10-30 DIAGNOSIS — E875 Hyperkalemia: Secondary | ICD-10-CM | POA: Diagnosis not present

## 2017-10-30 DIAGNOSIS — D631 Anemia in chronic kidney disease: Secondary | ICD-10-CM | POA: Diagnosis not present

## 2017-10-30 DIAGNOSIS — Z23 Encounter for immunization: Secondary | ICD-10-CM | POA: Diagnosis not present

## 2017-10-30 DIAGNOSIS — N2581 Secondary hyperparathyroidism of renal origin: Secondary | ICD-10-CM | POA: Diagnosis not present

## 2017-10-30 DIAGNOSIS — N186 End stage renal disease: Secondary | ICD-10-CM | POA: Diagnosis not present

## 2017-10-30 DIAGNOSIS — Z992 Dependence on renal dialysis: Secondary | ICD-10-CM | POA: Diagnosis not present

## 2017-10-31 DIAGNOSIS — Z0000 Encounter for general adult medical examination without abnormal findings: Secondary | ICD-10-CM | POA: Diagnosis not present

## 2017-11-01 DIAGNOSIS — Z23 Encounter for immunization: Secondary | ICD-10-CM | POA: Diagnosis not present

## 2017-11-01 DIAGNOSIS — Z992 Dependence on renal dialysis: Secondary | ICD-10-CM | POA: Diagnosis not present

## 2017-11-01 DIAGNOSIS — D631 Anemia in chronic kidney disease: Secondary | ICD-10-CM | POA: Diagnosis not present

## 2017-11-01 DIAGNOSIS — N186 End stage renal disease: Secondary | ICD-10-CM | POA: Diagnosis not present

## 2017-11-01 DIAGNOSIS — N2581 Secondary hyperparathyroidism of renal origin: Secondary | ICD-10-CM | POA: Diagnosis not present

## 2017-11-01 DIAGNOSIS — E875 Hyperkalemia: Secondary | ICD-10-CM | POA: Diagnosis not present

## 2017-11-04 DIAGNOSIS — E875 Hyperkalemia: Secondary | ICD-10-CM | POA: Diagnosis not present

## 2017-11-04 DIAGNOSIS — Z23 Encounter for immunization: Secondary | ICD-10-CM | POA: Diagnosis not present

## 2017-11-04 DIAGNOSIS — D631 Anemia in chronic kidney disease: Secondary | ICD-10-CM | POA: Diagnosis not present

## 2017-11-04 DIAGNOSIS — N186 End stage renal disease: Secondary | ICD-10-CM | POA: Diagnosis not present

## 2017-11-04 DIAGNOSIS — Z992 Dependence on renal dialysis: Secondary | ICD-10-CM | POA: Diagnosis not present

## 2017-11-04 DIAGNOSIS — N2581 Secondary hyperparathyroidism of renal origin: Secondary | ICD-10-CM | POA: Diagnosis not present

## 2017-11-07 DIAGNOSIS — N186 End stage renal disease: Secondary | ICD-10-CM | POA: Diagnosis not present

## 2017-11-07 DIAGNOSIS — N2581 Secondary hyperparathyroidism of renal origin: Secondary | ICD-10-CM | POA: Diagnosis not present

## 2017-11-07 DIAGNOSIS — Z23 Encounter for immunization: Secondary | ICD-10-CM | POA: Diagnosis not present

## 2017-11-07 DIAGNOSIS — Z992 Dependence on renal dialysis: Secondary | ICD-10-CM | POA: Diagnosis not present

## 2017-11-07 DIAGNOSIS — D631 Anemia in chronic kidney disease: Secondary | ICD-10-CM | POA: Diagnosis not present

## 2017-11-07 DIAGNOSIS — E875 Hyperkalemia: Secondary | ICD-10-CM | POA: Diagnosis not present

## 2017-11-08 DIAGNOSIS — Z23 Encounter for immunization: Secondary | ICD-10-CM | POA: Diagnosis not present

## 2017-11-08 DIAGNOSIS — D631 Anemia in chronic kidney disease: Secondary | ICD-10-CM | POA: Diagnosis not present

## 2017-11-08 DIAGNOSIS — Z992 Dependence on renal dialysis: Secondary | ICD-10-CM | POA: Diagnosis not present

## 2017-11-08 DIAGNOSIS — N2581 Secondary hyperparathyroidism of renal origin: Secondary | ICD-10-CM | POA: Diagnosis not present

## 2017-11-08 DIAGNOSIS — E875 Hyperkalemia: Secondary | ICD-10-CM | POA: Diagnosis not present

## 2017-11-08 DIAGNOSIS — N186 End stage renal disease: Secondary | ICD-10-CM | POA: Diagnosis not present

## 2017-11-11 DIAGNOSIS — N186 End stage renal disease: Secondary | ICD-10-CM | POA: Diagnosis not present

## 2017-11-11 DIAGNOSIS — N2581 Secondary hyperparathyroidism of renal origin: Secondary | ICD-10-CM | POA: Diagnosis not present

## 2017-11-11 DIAGNOSIS — Z23 Encounter for immunization: Secondary | ICD-10-CM | POA: Diagnosis not present

## 2017-11-11 DIAGNOSIS — D631 Anemia in chronic kidney disease: Secondary | ICD-10-CM | POA: Diagnosis not present

## 2017-11-11 DIAGNOSIS — E875 Hyperkalemia: Secondary | ICD-10-CM | POA: Diagnosis not present

## 2017-11-11 DIAGNOSIS — Z992 Dependence on renal dialysis: Secondary | ICD-10-CM | POA: Diagnosis not present

## 2017-11-12 DIAGNOSIS — I120 Hypertensive chronic kidney disease with stage 5 chronic kidney disease or end stage renal disease: Secondary | ICD-10-CM | POA: Diagnosis not present

## 2017-11-12 DIAGNOSIS — Z992 Dependence on renal dialysis: Secondary | ICD-10-CM | POA: Diagnosis not present

## 2017-11-12 DIAGNOSIS — N186 End stage renal disease: Secondary | ICD-10-CM | POA: Diagnosis not present

## 2017-11-13 DIAGNOSIS — E1129 Type 2 diabetes mellitus with other diabetic kidney complication: Secondary | ICD-10-CM | POA: Diagnosis not present

## 2017-11-13 DIAGNOSIS — N2581 Secondary hyperparathyroidism of renal origin: Secondary | ICD-10-CM | POA: Diagnosis not present

## 2017-11-13 DIAGNOSIS — Z992 Dependence on renal dialysis: Secondary | ICD-10-CM | POA: Diagnosis not present

## 2017-11-13 DIAGNOSIS — E875 Hyperkalemia: Secondary | ICD-10-CM | POA: Diagnosis not present

## 2017-11-13 DIAGNOSIS — N186 End stage renal disease: Secondary | ICD-10-CM | POA: Diagnosis not present

## 2017-11-15 DIAGNOSIS — N2581 Secondary hyperparathyroidism of renal origin: Secondary | ICD-10-CM | POA: Diagnosis not present

## 2017-11-15 DIAGNOSIS — E1129 Type 2 diabetes mellitus with other diabetic kidney complication: Secondary | ICD-10-CM | POA: Diagnosis not present

## 2017-11-15 DIAGNOSIS — Z992 Dependence on renal dialysis: Secondary | ICD-10-CM | POA: Diagnosis not present

## 2017-11-15 DIAGNOSIS — E875 Hyperkalemia: Secondary | ICD-10-CM | POA: Diagnosis not present

## 2017-11-15 DIAGNOSIS — N186 End stage renal disease: Secondary | ICD-10-CM | POA: Diagnosis not present

## 2017-11-18 DIAGNOSIS — Z992 Dependence on renal dialysis: Secondary | ICD-10-CM | POA: Diagnosis not present

## 2017-11-18 DIAGNOSIS — N2581 Secondary hyperparathyroidism of renal origin: Secondary | ICD-10-CM | POA: Diagnosis not present

## 2017-11-18 DIAGNOSIS — N186 End stage renal disease: Secondary | ICD-10-CM | POA: Diagnosis not present

## 2017-11-18 DIAGNOSIS — E1129 Type 2 diabetes mellitus with other diabetic kidney complication: Secondary | ICD-10-CM | POA: Diagnosis not present

## 2017-11-18 DIAGNOSIS — E875 Hyperkalemia: Secondary | ICD-10-CM | POA: Diagnosis not present

## 2017-11-20 DIAGNOSIS — E1129 Type 2 diabetes mellitus with other diabetic kidney complication: Secondary | ICD-10-CM | POA: Diagnosis not present

## 2017-11-20 DIAGNOSIS — Z992 Dependence on renal dialysis: Secondary | ICD-10-CM | POA: Diagnosis not present

## 2017-11-20 DIAGNOSIS — N186 End stage renal disease: Secondary | ICD-10-CM | POA: Diagnosis not present

## 2017-11-20 DIAGNOSIS — E875 Hyperkalemia: Secondary | ICD-10-CM | POA: Diagnosis not present

## 2017-11-20 DIAGNOSIS — N2581 Secondary hyperparathyroidism of renal origin: Secondary | ICD-10-CM | POA: Diagnosis not present

## 2017-11-22 DIAGNOSIS — N2581 Secondary hyperparathyroidism of renal origin: Secondary | ICD-10-CM | POA: Diagnosis not present

## 2017-11-22 DIAGNOSIS — N186 End stage renal disease: Secondary | ICD-10-CM | POA: Diagnosis not present

## 2017-11-22 DIAGNOSIS — E1129 Type 2 diabetes mellitus with other diabetic kidney complication: Secondary | ICD-10-CM | POA: Diagnosis not present

## 2017-11-22 DIAGNOSIS — Z992 Dependence on renal dialysis: Secondary | ICD-10-CM | POA: Diagnosis not present

## 2017-11-22 DIAGNOSIS — E875 Hyperkalemia: Secondary | ICD-10-CM | POA: Diagnosis not present

## 2017-11-25 DIAGNOSIS — Z992 Dependence on renal dialysis: Secondary | ICD-10-CM | POA: Diagnosis not present

## 2017-11-25 DIAGNOSIS — E875 Hyperkalemia: Secondary | ICD-10-CM | POA: Diagnosis not present

## 2017-11-25 DIAGNOSIS — N186 End stage renal disease: Secondary | ICD-10-CM | POA: Diagnosis not present

## 2017-11-25 DIAGNOSIS — N2581 Secondary hyperparathyroidism of renal origin: Secondary | ICD-10-CM | POA: Diagnosis not present

## 2017-11-25 DIAGNOSIS — E1129 Type 2 diabetes mellitus with other diabetic kidney complication: Secondary | ICD-10-CM | POA: Diagnosis not present

## 2017-11-26 ENCOUNTER — Ambulatory Visit: Payer: Medicare Other | Admitting: Internal Medicine

## 2017-11-26 DIAGNOSIS — R0989 Other specified symptoms and signs involving the circulatory and respiratory systems: Secondary | ICD-10-CM

## 2017-11-27 ENCOUNTER — Encounter: Payer: Self-pay | Admitting: *Deleted

## 2017-11-27 DIAGNOSIS — E1129 Type 2 diabetes mellitus with other diabetic kidney complication: Secondary | ICD-10-CM | POA: Diagnosis not present

## 2017-11-27 DIAGNOSIS — E875 Hyperkalemia: Secondary | ICD-10-CM | POA: Diagnosis not present

## 2017-11-27 DIAGNOSIS — N2581 Secondary hyperparathyroidism of renal origin: Secondary | ICD-10-CM | POA: Diagnosis not present

## 2017-11-27 DIAGNOSIS — N186 End stage renal disease: Secondary | ICD-10-CM | POA: Diagnosis not present

## 2017-11-27 DIAGNOSIS — Z992 Dependence on renal dialysis: Secondary | ICD-10-CM | POA: Diagnosis not present

## 2017-11-29 DIAGNOSIS — N186 End stage renal disease: Secondary | ICD-10-CM | POA: Diagnosis not present

## 2017-11-29 DIAGNOSIS — E875 Hyperkalemia: Secondary | ICD-10-CM | POA: Diagnosis not present

## 2017-11-29 DIAGNOSIS — E1129 Type 2 diabetes mellitus with other diabetic kidney complication: Secondary | ICD-10-CM | POA: Diagnosis not present

## 2017-11-29 DIAGNOSIS — Z992 Dependence on renal dialysis: Secondary | ICD-10-CM | POA: Diagnosis not present

## 2017-11-29 DIAGNOSIS — N2581 Secondary hyperparathyroidism of renal origin: Secondary | ICD-10-CM | POA: Diagnosis not present

## 2017-12-02 DIAGNOSIS — E875 Hyperkalemia: Secondary | ICD-10-CM | POA: Diagnosis not present

## 2017-12-02 DIAGNOSIS — N186 End stage renal disease: Secondary | ICD-10-CM | POA: Diagnosis not present

## 2017-12-02 DIAGNOSIS — E1129 Type 2 diabetes mellitus with other diabetic kidney complication: Secondary | ICD-10-CM | POA: Diagnosis not present

## 2017-12-02 DIAGNOSIS — N2581 Secondary hyperparathyroidism of renal origin: Secondary | ICD-10-CM | POA: Diagnosis not present

## 2017-12-02 DIAGNOSIS — Z992 Dependence on renal dialysis: Secondary | ICD-10-CM | POA: Diagnosis not present

## 2017-12-04 DIAGNOSIS — E875 Hyperkalemia: Secondary | ICD-10-CM | POA: Diagnosis not present

## 2017-12-04 DIAGNOSIS — Z992 Dependence on renal dialysis: Secondary | ICD-10-CM | POA: Diagnosis not present

## 2017-12-04 DIAGNOSIS — E1129 Type 2 diabetes mellitus with other diabetic kidney complication: Secondary | ICD-10-CM | POA: Diagnosis not present

## 2017-12-04 DIAGNOSIS — N2581 Secondary hyperparathyroidism of renal origin: Secondary | ICD-10-CM | POA: Diagnosis not present

## 2017-12-04 DIAGNOSIS — N186 End stage renal disease: Secondary | ICD-10-CM | POA: Diagnosis not present

## 2017-12-06 DIAGNOSIS — Z992 Dependence on renal dialysis: Secondary | ICD-10-CM | POA: Diagnosis not present

## 2017-12-06 DIAGNOSIS — E1129 Type 2 diabetes mellitus with other diabetic kidney complication: Secondary | ICD-10-CM | POA: Diagnosis not present

## 2017-12-06 DIAGNOSIS — E875 Hyperkalemia: Secondary | ICD-10-CM | POA: Diagnosis not present

## 2017-12-06 DIAGNOSIS — N2581 Secondary hyperparathyroidism of renal origin: Secondary | ICD-10-CM | POA: Diagnosis not present

## 2017-12-06 DIAGNOSIS — N186 End stage renal disease: Secondary | ICD-10-CM | POA: Diagnosis not present

## 2017-12-09 DIAGNOSIS — N186 End stage renal disease: Secondary | ICD-10-CM | POA: Diagnosis not present

## 2017-12-09 DIAGNOSIS — N2581 Secondary hyperparathyroidism of renal origin: Secondary | ICD-10-CM | POA: Diagnosis not present

## 2017-12-09 DIAGNOSIS — E875 Hyperkalemia: Secondary | ICD-10-CM | POA: Diagnosis not present

## 2017-12-09 DIAGNOSIS — Z992 Dependence on renal dialysis: Secondary | ICD-10-CM | POA: Diagnosis not present

## 2017-12-09 DIAGNOSIS — E1129 Type 2 diabetes mellitus with other diabetic kidney complication: Secondary | ICD-10-CM | POA: Diagnosis not present

## 2017-12-11 DIAGNOSIS — E875 Hyperkalemia: Secondary | ICD-10-CM | POA: Diagnosis not present

## 2017-12-11 DIAGNOSIS — Z992 Dependence on renal dialysis: Secondary | ICD-10-CM | POA: Diagnosis not present

## 2017-12-11 DIAGNOSIS — N186 End stage renal disease: Secondary | ICD-10-CM | POA: Diagnosis not present

## 2017-12-11 DIAGNOSIS — N2581 Secondary hyperparathyroidism of renal origin: Secondary | ICD-10-CM | POA: Diagnosis not present

## 2017-12-11 DIAGNOSIS — E1129 Type 2 diabetes mellitus with other diabetic kidney complication: Secondary | ICD-10-CM | POA: Diagnosis not present

## 2017-12-12 DIAGNOSIS — N186 End stage renal disease: Secondary | ICD-10-CM | POA: Diagnosis not present

## 2017-12-12 DIAGNOSIS — N2581 Secondary hyperparathyroidism of renal origin: Secondary | ICD-10-CM | POA: Diagnosis not present

## 2017-12-12 DIAGNOSIS — E8779 Other fluid overload: Secondary | ICD-10-CM | POA: Diagnosis not present

## 2017-12-13 DIAGNOSIS — I120 Hypertensive chronic kidney disease with stage 5 chronic kidney disease or end stage renal disease: Secondary | ICD-10-CM | POA: Diagnosis not present

## 2017-12-13 DIAGNOSIS — N186 End stage renal disease: Secondary | ICD-10-CM | POA: Diagnosis not present

## 2017-12-13 DIAGNOSIS — D631 Anemia in chronic kidney disease: Secondary | ICD-10-CM | POA: Diagnosis not present

## 2017-12-13 DIAGNOSIS — Z992 Dependence on renal dialysis: Secondary | ICD-10-CM | POA: Diagnosis not present

## 2017-12-13 DIAGNOSIS — N2581 Secondary hyperparathyroidism of renal origin: Secondary | ICD-10-CM | POA: Diagnosis not present

## 2017-12-16 DIAGNOSIS — Z992 Dependence on renal dialysis: Secondary | ICD-10-CM | POA: Diagnosis not present

## 2017-12-16 DIAGNOSIS — N2581 Secondary hyperparathyroidism of renal origin: Secondary | ICD-10-CM | POA: Diagnosis not present

## 2017-12-16 DIAGNOSIS — D631 Anemia in chronic kidney disease: Secondary | ICD-10-CM | POA: Diagnosis not present

## 2017-12-16 DIAGNOSIS — N186 End stage renal disease: Secondary | ICD-10-CM | POA: Diagnosis not present

## 2017-12-18 DIAGNOSIS — N2581 Secondary hyperparathyroidism of renal origin: Secondary | ICD-10-CM | POA: Diagnosis not present

## 2017-12-18 DIAGNOSIS — N186 End stage renal disease: Secondary | ICD-10-CM | POA: Diagnosis not present

## 2017-12-18 DIAGNOSIS — Z992 Dependence on renal dialysis: Secondary | ICD-10-CM | POA: Diagnosis not present

## 2017-12-18 DIAGNOSIS — D631 Anemia in chronic kidney disease: Secondary | ICD-10-CM | POA: Diagnosis not present

## 2017-12-20 DIAGNOSIS — Z992 Dependence on renal dialysis: Secondary | ICD-10-CM | POA: Diagnosis not present

## 2017-12-20 DIAGNOSIS — D631 Anemia in chronic kidney disease: Secondary | ICD-10-CM | POA: Diagnosis not present

## 2017-12-20 DIAGNOSIS — N2581 Secondary hyperparathyroidism of renal origin: Secondary | ICD-10-CM | POA: Diagnosis not present

## 2017-12-20 DIAGNOSIS — N186 End stage renal disease: Secondary | ICD-10-CM | POA: Diagnosis not present

## 2017-12-23 DIAGNOSIS — D631 Anemia in chronic kidney disease: Secondary | ICD-10-CM | POA: Diagnosis not present

## 2017-12-23 DIAGNOSIS — Z992 Dependence on renal dialysis: Secondary | ICD-10-CM | POA: Diagnosis not present

## 2017-12-23 DIAGNOSIS — N2581 Secondary hyperparathyroidism of renal origin: Secondary | ICD-10-CM | POA: Diagnosis not present

## 2017-12-23 DIAGNOSIS — N186 End stage renal disease: Secondary | ICD-10-CM | POA: Diagnosis not present

## 2017-12-24 DIAGNOSIS — D631 Anemia in chronic kidney disease: Secondary | ICD-10-CM | POA: Diagnosis not present

## 2017-12-24 DIAGNOSIS — N186 End stage renal disease: Secondary | ICD-10-CM | POA: Diagnosis not present

## 2017-12-24 DIAGNOSIS — N2581 Secondary hyperparathyroidism of renal origin: Secondary | ICD-10-CM | POA: Diagnosis not present

## 2017-12-24 DIAGNOSIS — Z992 Dependence on renal dialysis: Secondary | ICD-10-CM | POA: Diagnosis not present

## 2017-12-25 DIAGNOSIS — Z992 Dependence on renal dialysis: Secondary | ICD-10-CM | POA: Diagnosis not present

## 2017-12-25 DIAGNOSIS — D631 Anemia in chronic kidney disease: Secondary | ICD-10-CM | POA: Diagnosis not present

## 2017-12-25 DIAGNOSIS — N186 End stage renal disease: Secondary | ICD-10-CM | POA: Diagnosis not present

## 2017-12-25 DIAGNOSIS — N2581 Secondary hyperparathyroidism of renal origin: Secondary | ICD-10-CM | POA: Diagnosis not present

## 2017-12-27 DIAGNOSIS — Z992 Dependence on renal dialysis: Secondary | ICD-10-CM | POA: Diagnosis not present

## 2017-12-27 DIAGNOSIS — D631 Anemia in chronic kidney disease: Secondary | ICD-10-CM | POA: Diagnosis not present

## 2017-12-27 DIAGNOSIS — N2581 Secondary hyperparathyroidism of renal origin: Secondary | ICD-10-CM | POA: Diagnosis not present

## 2017-12-27 DIAGNOSIS — N186 End stage renal disease: Secondary | ICD-10-CM | POA: Diagnosis not present

## 2018-01-01 ENCOUNTER — Observation Stay (HOSPITAL_COMMUNITY)
Admission: EM | Admit: 2018-01-01 | Discharge: 2018-01-02 | Disposition: A | Discharge status: Home / Self Care | Payer: Medicare Other | Attending: Internal Medicine | Admitting: Internal Medicine

## 2018-01-01 ENCOUNTER — Emergency Department (HOSPITAL_COMMUNITY): Payer: Medicare Other

## 2018-01-01 ENCOUNTER — Other Ambulatory Visit: Payer: Self-pay

## 2018-01-01 ENCOUNTER — Encounter (HOSPITAL_COMMUNITY): Payer: Self-pay

## 2018-01-01 DIAGNOSIS — I480 Paroxysmal atrial fibrillation: Secondary | ICD-10-CM

## 2018-01-01 DIAGNOSIS — R531 Weakness: Secondary | ICD-10-CM | POA: Diagnosis not present

## 2018-01-01 DIAGNOSIS — E1140 Type 2 diabetes mellitus with diabetic neuropathy, unspecified: Secondary | ICD-10-CM | POA: Diagnosis present

## 2018-01-01 DIAGNOSIS — Z96653 Presence of artificial knee joint, bilateral: Secondary | ICD-10-CM | POA: Diagnosis not present

## 2018-01-01 DIAGNOSIS — E11319 Type 2 diabetes mellitus with unspecified diabetic retinopathy without macular edema: Secondary | ICD-10-CM | POA: Diagnosis not present

## 2018-01-01 DIAGNOSIS — Z992 Dependence on renal dialysis: Secondary | ICD-10-CM | POA: Insufficient documentation

## 2018-01-01 DIAGNOSIS — Z8679 Personal history of other diseases of the circulatory system: Secondary | ICD-10-CM

## 2018-01-01 DIAGNOSIS — N186 End stage renal disease: Secondary | ICD-10-CM | POA: Diagnosis not present

## 2018-01-01 DIAGNOSIS — E8889 Other specified metabolic disorders: Secondary | ICD-10-CM | POA: Diagnosis not present

## 2018-01-01 DIAGNOSIS — Z79899 Other long term (current) drug therapy: Secondary | ICD-10-CM | POA: Diagnosis not present

## 2018-01-01 DIAGNOSIS — D638 Anemia in other chronic diseases classified elsewhere: Secondary | ICD-10-CM | POA: Insufficient documentation

## 2018-01-01 DIAGNOSIS — R0600 Dyspnea, unspecified: Secondary | ICD-10-CM | POA: Diagnosis present

## 2018-01-01 DIAGNOSIS — D631 Anemia in chronic kidney disease: Secondary | ICD-10-CM | POA: Diagnosis present

## 2018-01-01 DIAGNOSIS — M109 Gout, unspecified: Secondary | ICD-10-CM | POA: Diagnosis not present

## 2018-01-01 DIAGNOSIS — Z8673 Personal history of transient ischemic attack (TIA), and cerebral infarction without residual deficits: Secondary | ICD-10-CM | POA: Diagnosis not present

## 2018-01-01 DIAGNOSIS — Z96641 Presence of right artificial hip joint: Secondary | ICD-10-CM | POA: Insufficient documentation

## 2018-01-01 DIAGNOSIS — I5043 Acute on chronic combined systolic (congestive) and diastolic (congestive) heart failure: Secondary | ICD-10-CM | POA: Insufficient documentation

## 2018-01-01 DIAGNOSIS — R0609 Other forms of dyspnea: Secondary | ICD-10-CM | POA: Diagnosis not present

## 2018-01-01 DIAGNOSIS — I252 Old myocardial infarction: Secondary | ICD-10-CM | POA: Diagnosis not present

## 2018-01-01 DIAGNOSIS — E785 Hyperlipidemia, unspecified: Secondary | ICD-10-CM | POA: Diagnosis not present

## 2018-01-01 DIAGNOSIS — G4733 Obstructive sleep apnea (adult) (pediatric): Secondary | ICD-10-CM | POA: Diagnosis not present

## 2018-01-01 DIAGNOSIS — N189 Chronic kidney disease, unspecified: Secondary | ICD-10-CM

## 2018-01-01 DIAGNOSIS — N2581 Secondary hyperparathyroidism of renal origin: Secondary | ICD-10-CM | POA: Diagnosis not present

## 2018-01-01 DIAGNOSIS — Z8674 Personal history of sudden cardiac arrest: Secondary | ICD-10-CM | POA: Insufficient documentation

## 2018-01-01 DIAGNOSIS — E1122 Type 2 diabetes mellitus with diabetic chronic kidney disease: Secondary | ICD-10-CM | POA: Insufficient documentation

## 2018-01-01 DIAGNOSIS — E1340 Other specified diabetes mellitus with diabetic neuropathy, unspecified: Secondary | ICD-10-CM

## 2018-01-01 DIAGNOSIS — I120 Hypertensive chronic kidney disease with stage 5 chronic kidney disease or end stage renal disease: Secondary | ICD-10-CM | POA: Diagnosis not present

## 2018-01-01 DIAGNOSIS — I482 Chronic atrial fibrillation, unspecified: Secondary | ICD-10-CM | POA: Diagnosis present

## 2018-01-01 DIAGNOSIS — E875 Hyperkalemia: Principal | ICD-10-CM | POA: Diagnosis present

## 2018-01-01 DIAGNOSIS — I132 Hypertensive heart and chronic kidney disease with heart failure and with stage 5 chronic kidney disease, or end stage renal disease: Secondary | ICD-10-CM | POA: Insufficient documentation

## 2018-01-01 LAB — CBC
HEMATOCRIT: 31.2 % — AB (ref 39.0–52.0)
HEMOGLOBIN: 10.2 g/dL — AB (ref 13.0–17.0)
MCH: 31.7 pg (ref 26.0–34.0)
MCHC: 32.7 g/dL (ref 30.0–36.0)
MCV: 96.9 fL (ref 80.0–100.0)
Platelets: 167 10*3/uL (ref 150–400)
RBC: 3.22 MIL/uL — ABNORMAL LOW (ref 4.22–5.81)
RDW: 14.6 % (ref 11.5–15.5)
WBC: 5 10*3/uL (ref 4.0–10.5)
nRBC: 0 % (ref 0.0–0.2)

## 2018-01-01 LAB — GLUCOSE, CAPILLARY: Glucose-Capillary: 111 mg/dL — ABNORMAL HIGH (ref 70–99)

## 2018-01-01 LAB — COMPREHENSIVE METABOLIC PANEL
ALT: 15 U/L (ref 0–44)
AST: 9 U/L — AB (ref 15–41)
Albumin: 3.2 g/dL — ABNORMAL LOW (ref 3.5–5.0)
Alkaline Phosphatase: 99 U/L (ref 38–126)
Anion gap: 16 — ABNORMAL HIGH (ref 5–15)
BUN: 101 mg/dL — ABNORMAL HIGH (ref 8–23)
CHLORIDE: 95 mmol/L — AB (ref 98–111)
CO2: 16 mmol/L — ABNORMAL LOW (ref 22–32)
Calcium: 8.2 mg/dL — ABNORMAL LOW (ref 8.9–10.3)
Creatinine, Ser: 16.84 mg/dL — ABNORMAL HIGH (ref 0.61–1.24)
GFR, EST AFRICAN AMERICAN: 3 mL/min — AB (ref >60)
GFR, EST NON AFRICAN AMERICAN: 3 mL/min — AB (ref >60)
Glucose, Bld: 104 mg/dL — ABNORMAL HIGH (ref 70–99)
Sodium: 127 mmol/L — ABNORMAL LOW (ref 135–145)
TOTAL PROTEIN: 6.8 g/dL (ref 6.5–8.1)
Total Bilirubin: 0.5 mg/dL (ref 0.3–1.2)

## 2018-01-01 LAB — CBC WITH DIFFERENTIAL/PLATELET
Abs Immature Granulocytes: 0.03 10*3/uL (ref 0.00–0.07)
BASOS ABS: 0 10*3/uL (ref 0.0–0.1)
Basophils Relative: 1 %
EOS ABS: 0.4 10*3/uL (ref 0.0–0.5)
EOS PCT: 4 %
HCT: 31.1 % — ABNORMAL LOW (ref 39.0–52.0)
Hemoglobin: 9.9 g/dL — ABNORMAL LOW (ref 13.0–17.0)
Immature Granulocytes: 0 %
Lymphocytes Relative: 18 %
Lymphs Abs: 1.4 10*3/uL (ref 0.7–4.0)
MCH: 31.6 pg (ref 26.0–34.0)
MCHC: 31.8 g/dL (ref 30.0–36.0)
MCV: 99.4 fL (ref 80.0–100.0)
Monocytes Absolute: 1 10*3/uL (ref 0.1–1.0)
Monocytes Relative: 12 %
NRBC: 0 % (ref 0.0–0.2)
Neutro Abs: 5.4 10*3/uL (ref 1.7–7.7)
Neutrophils Relative %: 65 %
PLATELETS: 178 10*3/uL (ref 150–400)
RBC: 3.13 MIL/uL — ABNORMAL LOW (ref 4.22–5.81)
RDW: 14.5 % (ref 11.5–15.5)
WBC: 8.2 10*3/uL (ref 4.0–10.5)

## 2018-01-01 LAB — RENAL FUNCTION PANEL
Albumin: 2.8 g/dL — ABNORMAL LOW (ref 3.5–5.0)
Anion gap: 9 (ref 5–15)
BUN: 41 mg/dL — AB (ref 8–23)
CHLORIDE: 96 mmol/L — AB (ref 98–111)
CO2: 26 mmol/L (ref 22–32)
Calcium: 8.9 mg/dL (ref 8.9–10.3)
Creatinine, Ser: 9.16 mg/dL — ABNORMAL HIGH (ref 0.61–1.24)
GFR calc Af Amer: 6 mL/min — ABNORMAL LOW (ref >60)
GFR calc non Af Amer: 5 mL/min — ABNORMAL LOW (ref >60)
GLUCOSE: 147 mg/dL — AB (ref 70–99)
POTASSIUM: 4.7 mmol/L (ref 3.5–5.1)
Phosphorus: 4.3 mg/dL (ref 2.5–4.6)
Sodium: 131 mmol/L — ABNORMAL LOW (ref 135–145)

## 2018-01-01 MED ORDER — SODIUM CHLORIDE 0.9 % IV SOLN: 100.0000 mL | INTRAVENOUS | Status: DC | PRN

## 2018-01-01 MED ORDER — ALLOPURINOL 100 MG PO TABS
100.0000 mg | ORAL_TABLET | Freq: Two times a day (BID) | ORAL | Status: DC
  Administered 2018-01-01 – 2018-01-02 (×2): 100 mg via ORAL
  Filled 2018-01-01 (×3): qty 1

## 2018-01-01 MED ORDER — ONDANSETRON HCL 4 MG PO TABS: 4.0000 mg | ORAL_TABLET | Freq: Four times a day (QID) | ORAL | Status: DC | PRN

## 2018-01-01 MED ORDER — DEXTROSE 50 % IV SOLN: 1.0000 | Freq: Once | INTRAVENOUS | Status: DC

## 2018-01-01 MED ORDER — INSULIN ASPART 100 UNIT/ML IV SOLN: 5.0000 [IU] | Freq: Once | INTRAVENOUS | Status: DC

## 2018-01-01 MED ORDER — DOCUSATE SODIUM 100 MG PO CAPS: 100.0000 mg | ORAL_CAPSULE | Freq: Every day | ORAL | Status: DC | PRN

## 2018-01-01 MED ORDER — ONDANSETRON HCL 4 MG/2ML IJ SOLN: 4.0000 mg | Freq: Four times a day (QID) | INTRAMUSCULAR | Status: DC | PRN

## 2018-01-01 MED ORDER — SIMVASTATIN 20 MG PO TABS
20.0000 mg | ORAL_TABLET | Freq: Every day | ORAL | Status: DC
  Administered 2018-01-01: 20 mg via ORAL
  Filled 2018-01-01: qty 1

## 2018-01-01 MED ORDER — ALBUTEROL SULFATE (2.5 MG/3ML) 0.083% IN NEBU: 10.0000 mg | INHALATION_SOLUTION | Freq: Once | RESPIRATORY_TRACT | Status: DC

## 2018-01-01 MED ORDER — DEXTROSE 10 % IV SOLN: Freq: Once | INTRAVENOUS | Status: DC

## 2018-01-01 MED ORDER — CARVEDILOL 12.5 MG PO TABS: 12.5000 mg | ORAL_TABLET | ORAL | Status: DC

## 2018-01-01 MED ORDER — SALINE SPRAY 0.65 % NA SOLN: 1.0000 | NASAL | Status: DC | PRN

## 2018-01-01 MED ORDER — LIDOCAINE-PRILOCAINE 2.5-2.5 % EX CREA
1.0000 "application " | TOPICAL_CREAM | CUTANEOUS | Status: DC | PRN
  Filled 2018-01-01: qty 5

## 2018-01-01 MED ORDER — TRAMADOL HCL 50 MG PO TABS: 50.0000 mg | ORAL_TABLET | Freq: Four times a day (QID) | ORAL | Status: DC | PRN

## 2018-01-01 MED ORDER — ACETAMINOPHEN 325 MG PO TABS: 650.0000 mg | ORAL_TABLET | Freq: Three times a day (TID) | ORAL | Status: DC | PRN

## 2018-01-01 MED ORDER — CALCIUM GLUCONATE 10 % IV SOLN: 1.0000 g | Freq: Once | INTRAVENOUS | Status: DC

## 2018-01-01 MED ORDER — GABAPENTIN 100 MG PO CAPS
100.0000 mg | ORAL_CAPSULE | Freq: Every day | ORAL | Status: DC
  Administered 2018-01-02: 100 mg via ORAL
  Filled 2018-01-01 (×2): qty 1

## 2018-01-01 MED ORDER — ALTEPLASE 2 MG IJ SOLR
2.0000 mg | Freq: Once | INTRAMUSCULAR | Status: DC | PRN
  Filled 2018-01-01: qty 2

## 2018-01-01 MED ORDER — AMITRIPTYLINE HCL 25 MG PO TABS
50.0000 mg | ORAL_TABLET | Freq: Every day | ORAL | Status: DC
  Administered 2018-01-01: 50 mg via ORAL
  Filled 2018-01-01: qty 2

## 2018-01-01 MED ORDER — PENTAFLUOROPROP-TETRAFLUOROETH EX AERO
1.0000 "application " | INHALATION_SPRAY | CUTANEOUS | Status: DC | PRN
  Filled 2018-01-01: qty 116

## 2018-01-01 MED ORDER — LIDOCAINE HCL (PF) 1 % IJ SOLN
5.0000 mL | INTRAMUSCULAR | Status: DC | PRN
  Filled 2018-01-01: qty 5

## 2018-01-01 MED ORDER — HEPARIN SODIUM (PORCINE) 5000 UNIT/ML IJ SOLN
5000.0000 [IU] | Freq: Three times a day (TID) | INTRAMUSCULAR | Status: DC
  Administered 2018-01-01 – 2018-01-02 (×3): 5000 [IU] via SUBCUTANEOUS
  Filled 2018-01-01 (×3): qty 1

## 2018-01-01 MED ORDER — HEPARIN SODIUM (PORCINE) 1000 UNIT/ML DIALYSIS
1000.0000 [IU] | INTRAMUSCULAR | Status: DC | PRN
  Filled 2018-01-01: qty 1

## 2018-01-01 MED ORDER — FERRIC CITRATE 1 GM 210 MG(FE) PO TABS
210.0000 mg | ORAL_TABLET | Freq: Three times a day (TID) | ORAL | Status: DC
  Administered 2018-01-02: 210 mg via ORAL
  Filled 2018-01-01 (×2): qty 1

## 2018-01-01 MED ORDER — INSULIN ASPART 100 UNIT/ML ~~LOC~~ SOLN: 0.0000 [IU] | Freq: Three times a day (TID) | SUBCUTANEOUS | Status: DC

## 2018-01-01 MED ORDER — PRAMOXINE-MENTHOL-DIMETHICONE 1-0.5-5 % EX LOTN: TOPICAL_LOTION | Freq: Every day | CUTANEOUS | Status: DC | PRN

## 2018-01-01 MED ORDER — GABAPENTIN 100 MG PO CAPS
200.0000 mg | ORAL_CAPSULE | Freq: Every day | ORAL | Status: DC
  Administered 2018-01-01: 200 mg via ORAL
  Filled 2018-01-01: qty 2

## 2018-01-01 MED ORDER — CHLORHEXIDINE GLUCONATE CLOTH 2 % EX PADS
6.0000 | MEDICATED_PAD | Freq: Every day | CUTANEOUS | Status: DC
  Administered 2018-01-02: 6 via TOPICAL

## 2018-01-01 NOTE — Consult Note (Addendum)
Reason for Consult: To manage dialysis and dialysis related needs Referring Physician: Dr Rex Kras, EDP  Daniel Whitney is an 62 y.o. male.  HPI: Pt is a 72M with a PMH sig for ESRD on HD, arthritis, h/o subdural hematoma, DM, Afib, CHF who is now seen in consultation at the request of Dr. Rex Kras for management of ESRD and provision of HD.  Pt was supposed to go to [place] this week and had thought he made arrangements for HD on vacation.  He wasn't able to go to [place].  In the meantime, his normal HD spot was given to someone for extra rx so he didn't go Monday.  Last full session was Friday.    Presents to ED for weakness.  K > 7.5, BUN 101, Cr 16, CO2 16, Hgb 9.9.  Asked to see for HD.    Pt reports weakness and SOB.  No CP.  Notes scabs started appearing on fistula ~ 2 weeks ago.  Stayed full time last HD session but left 3.7 kg over EDW.  CXR without infectious process.  Dialysis orders: [facility] 4 hr 15 min 2K/ 3.5 Ca F180 113 kg EDW AVF Mircera 50 q 2 weeks, last given 11/11.   Past Medical History:  Diagnosis Date  . Allergic rhinitis 07/07/2014  . Anemia due to other cause 07/07/2014  . Arthritis    "all over"  . Atrial fibrillation ([place]) 12/30/2012  . CHF (congestive heart failure) ([place]) 12/30/2012  . Chronic lower back pain   . Diabetes mellitus with neuropathy ([place])   . Diabetic retinopathy ([place]) 12/30/2012   no medications  . Dysrhythmia    Afib  . ESRD (end stage renal disease) on dialysis ([place]) started in 2013   MWF; [facility]; [employer]  . GERD (gastroesophageal reflux disease)   . Glaucoma   . History of gout    "right big toe"  . Hyperlipidemia   . Hypertension   . Hypothyroid    01/17/16- no longer on meduication  . Morbid obesity ([place]) 12/30/2012  . Myocardial infarction ([place])    "I've had ~ 3; last one was in ~ 10/2013" (01/30/2014)  . OSA (obstructive sleep apnea) 12/30/2012   "lost weight; no longer needed CPAP; retested said I needed it; didn't followup cause I  was feeling fine" (01/30/2014)  . Pulmonary hypertension ([place]) 12/30/2012  . Refusal of blood transfusions as patient is Jehovah's Witness   . Stroke [facility]) ~ 2005; ~ 2005   "they were mild; I didn't even notice I'd had them"; denies residual on 01/30/2014  . Subdural hematoma (Oct 2018)    Coumadin and heparin placed on hold at dc- pt did not FU w/ NS as recommended after dc so not on warfarin when admitted 04/03/17    Past Surgical History:  Procedure Laterality Date  . AV FISTULA PLACEMENT Left ~ 10/2013   "forearm"  . CARDIAC CATHETERIZATION N/A 01/07/2015   Procedure: Left Heart Cath and Coronary Angiography;  Surgeon: Troy Sine, MD;  Location: [facility];  Service: Cardiovascular;  Laterality: N/A;  . CLOSED REDUCTION HIP DISLOCATION Right 1970's  . JOINT REPLACEMENT    . TOTAL HIP ARTHROPLASTY Right 1980  . TOTAL KNEE ARTHROPLASTY Right 01/24/2016   Procedure: TOTAL KNEE ARTHROPLASTY;  Surgeon: Dorna Leitz, MD;  Location: [facility];  Service: Orthopedics;  Laterality: Right;  . TOTAL KNEE ARTHROPLASTY Left 06/26/2016   Procedure: TOTAL KNEE ARTHROPLASTY;  Surgeon: Dorna Leitz, MD;  Location: [facility];  Service:  Orthopedics;  Laterality: Left;    Family History  Problem Relation Age of Onset  . Arthritis Other   . Heart disease Other   . Hyperlipidemia Other   . Hypertension Other   . Kidney disease Other   . Diabetes Other   . Stroke Other   . Diabetes Mother   . Cancer Father        bone cancer  . Heart disease Father   . Diabetes Sister   . Diabetes Brother   . Stroke Brother     Social History:  reports that he has never smoked. He has never used smokeless tobacco. He reports that he drinks about 7.0 - 8.0 standard drinks of alcohol per week. He reports that he does not use drugs.  Allergies:  Allergies  Allergen Reactions  . Tobacco [Nicotiana Tabacum] Shortness Of Breath    Medications:  Scheduled: . albuterol  10 mg Nebulization Once  . calcium  gluconate  1 g Intravenous Once  . Chlorhexidine Gluconate Cloth  6 each Topical Q0600  . insulin aspart  5 Units Intravenous Once   And  . dextrose  1 ampule Intravenous Once     Results for orders placed or performed during the hospital encounter of 01/01/18 (from the past 48 hour(s))  CBC with Differential     Status: Abnormal   Collection Time: 01/01/18  8:46 AM  Result Value Ref Range   WBC 8.2 4.0 - 10.5 K/uL   RBC 3.13 (L) 4.22 - 5.81 MIL/uL   Hemoglobin 9.9 (L) 13.0 - 17.0 g/dL   HCT 31.1 (L) 39.0 - 52.0 %   MCV 99.4 80.0 - 100.0 fL   MCH 31.6 26.0 - 34.0 pg   MCHC 31.8 30.0 - 36.0 g/dL   RDW 14.5 11.5 - 15.5 %   Platelets 178 150 - 400 K/uL   nRBC 0.0 0.0 - 0.2 %   Neutrophils Relative % 65 %   Neutro Abs 5.4 1.7 - 7.7 K/uL   Lymphocytes Relative 18 %   Lymphs Abs 1.4 0.7 - 4.0 K/uL   Monocytes Relative 12 %   Monocytes Absolute 1.0 0.1 - 1.0 K/uL   Eosinophils Relative 4 %   Eosinophils Absolute 0.4 0.0 - 0.5 K/uL   Basophils Relative 1 %   Basophils Absolute 0.0 0.0 - 0.1 K/uL   Immature Granulocytes 0 %   Abs Immature Granulocytes 0.03 0.00 - 0.07 K/uL    Comment: Performed at [facility], [address], [place], [place] [address]  Comprehensive metabolic panel     Status: Abnormal   Collection Time: 01/01/18  8:46 AM  Result Value Ref Range   Sodium 127 (L) 135 - 145 mmol/L   Potassium >7.5 (HH) 3.5 - 5.1 mmol/L    Comment: NO VISIBLE HEMOLYSIS CRITICAL RESULT CALLED TO, READ BACK BY AND VERIFIED WITH: B.HICKS,RN 01/01/18 0938 DAVISB    Chloride 95 (L) 98 - 111 mmol/L   CO2 16 (L) 22 - 32 mmol/L   Glucose, Bld 104 (H) 70 - 99 mg/dL   BUN 101 (H) 8 - 23 mg/dL   Creatinine, Ser 16.84 (H) 0.61 - 1.24 mg/dL   Calcium 8.2 (L) 8.9 - 10.3 mg/dL   Total Protein 6.8 6.5 - 8.1 g/dL   Albumin 3.2 (L) 3.5 - 5.0 g/dL   AST 9 (L) 15 - 41 U/L   ALT 15 0 - 44 U/L   Alkaline Phosphatase 99 38 - 126 U/L  Total Bilirubin 0.5 0.3 - 1.2 mg/dL   GFR calc non Af  Amer 3 (L) >60 mL/min   GFR calc Af Amer 3 (L) >60 mL/min    Comment: (NOTE) The eGFR has been calculated using the CKD EPI equation. This calculation has not been validated in all clinical situations. eGFR's persistently <60 mL/min signify possible Chronic Kidney Disease.    Anion gap 16 (H) 5 - 15    Comment: Performed at [facility], [place] [address], [place], [place] [address]    Dg Chest Port 1 View  Result Date: 01/01/2018 CLINICAL DATA:  Weakness while standing. EXAM: PORTABLE CHEST 1 VIEW COMPARISON:  04/03/2017 FINDINGS: Artifact overlies the chest. Heart size is at the upper limits of normal. Tortuosity of the aorta. The lungs are clear. The vascularity is normal. No visible effusions. IMPRESSION: No active disease allowing for technical factors. Electronically Signed   By: Nelson Chimes M.D.   On: 01/01/2018 09:04    ROS: all other systems reviewed and are negative except as per HPI Blood pressure (!) 102/55, pulse 74, temperature 98 F (36.7 C), temperature source Oral, resp. rate 20, height _0  (1.753 m), weight 117.9 kg, SpO2 94 %. .  GEN NAD, lying in bed HEENT EOMI PERRL NECK + JVD to mandible PULM slightly increased WOB, coarse anteriorly CV irregular II/VI systolic murmur ABD obese, NABS EXT 1+ UE edema, some LE edema NEURO AAO x 3 ACCESS L FA AVF, some scabbing  Assessment/Plan: 1 Weakness/ hyperkalemia: K > 7.5 in the setting of missed HD.  Emergent HD today and then again tomorrow. 2 ESRD: MWF normally, missed Monday.  Will contact OP center and notify them that he didn't go to [place]. 3 Hypertension: usually has some IDH, will monitor.  Not on midodrine as OP according to med list 4. Anemia of ESRD: Hgb adequate, got ESA 11/11 5. Metabolic Bone Disease: no Vit D products in center, on AUryxia as OP 6.  Nutrition: renal diet 7.  Dispo: discussed with ED PA that he needs admission to hospitalist service for serial HD.  Madelon Lips 01/01/2018, 10:27  AM

## 2018-01-01 NOTE — Procedures (Signed)
Patient seen and examined on Hemodialysis. QB 400 mL/ min  UF goal 4L  Tolerating well.  Emergent for HyperK  Treatment adjusted as needed.  Madelon Lips MD [facility] pgr [phone] 11:33 AM

## 2018-01-01 NOTE — H&P (Signed)
History and Physical  Daniel Whitney ZOX:[id number] DOB: Dec 11, 1955 DOA: 01/01/2018  PCP: [name], MD   Patient coming from:Home   Chief Complaint: leg weakness and shortness of breath  HPI: Daniel Whitney is a 62 y.o. male with medical history significant for paroxysmal atrial fibrillation, ESRD on HD Monday Wednesday Friday, previous history of cardiac arrest, history of subdural hematoma, hypothyroidism, HTN, hyperlipidemia, GERD, type 2 diabetes who presents on 01/01/2018 with 1 day of worsening leg weakness shortness of breath with exertion was found to have elevated hyperkalemia after missing one dialysis session as an outpatient.  Patient was planning to go on vacation to [place] this past Monday but canceled a trip was unable to resume his typical outpatient HD session as there was no availability.  He was in his usual state of health until 11/19 when he noticed significant weakness in his lower legs and inability to stand additionally he was more short of breath than usual which prompted him to come to ED for further evaluation.  Denies any fevers, chills, cough, chest pain, headache, abdominal pain, diarrhea, constipation..    ED Course: In the ED he was afebrile, tachypneic 20s, heart rate 130s with sinus tachycardia on EKG.  Lab work was significant for hemoglobin of 9.9, sodium 127, potassium 7.5.  Patient was given temporizing medications status post calcium gluconate, albuterol, insulin and sent for emergent HD from the ED.  [facility]ospital[facility] service was called for admission and further management  Review of Systems:As mentioned in the history of present illness.Review of systems are otherwise negative Patient seen in the ED .   Past Medical History:  Diagnosis Date  . Allergic rhinitis 07/07/2014  . Anemia due to other cause 07/07/2014  . Arthritis    "all over"  . Atrial fibrillation ([place]) 12/30/2012  . CHF (congestive heart failure) ([place]) 12/30/2012  . Chronic lower back  pain   . Diabetes mellitus with neuropathy ([place])   . Diabetic retinopathy ([place]) 12/30/2012   no medications  . Dysrhythmia    Afib  . ESRD (end stage renal disease) on dialysis ([place]) started in 2013   MWF; [facility]; [employer]  . GERD (gastroesophageal reflux disease)   . Glaucoma   . History of gout    "right big toe"  . Hyperlipidemia   . Hypertension   . Hypothyroid    01/17/16- no longer on meduication  . Morbid obesity ([place]) 12/30/2012  . Myocardial infarction ([place])    "I've had ~ 3; last one was in ~ 10/2013" (01/30/2014)  . OSA (obstructive sleep apnea) 12/30/2012   "lost weight; no longer needed CPAP; retested said I needed it; didn't followup cause I was feeling fine" (01/30/2014)  . Pulmonary hypertension ([place]) 12/30/2012  . Refusal of blood transfusions as patient is Jehovah's Witness   . Stroke [facility]) ~ 2005; ~ 2005   "they were mild; I didn't even notice I'd had them"; denies residual on 01/30/2014  . Subdural hematoma (Oct 2018)    Coumadin and heparin placed on hold at dc- pt did not FU w/ NS as recommended after dc so not on warfarin when admitted 04/03/17   Past Surgical History:  Procedure Laterality Date  . AV FISTULA PLACEMENT Left ~ 10/2013   "forearm"  . CARDIAC CATHETERIZATION N/A 01/07/2015   Procedure: Left Heart Cath and Coronary Angiography;  Surgeon: Troy Sine, MD;  Location: [facility];  Service: Cardiovascular;  Laterality: N/A;  . CLOSED REDUCTION HIP DISLOCATION Right 1970's  .  JOINT REPLACEMENT    . TOTAL HIP ARTHROPLASTY Right 1980  . TOTAL KNEE ARTHROPLASTY Right 01/24/2016   Procedure: TOTAL KNEE ARTHROPLASTY;  Surgeon: Dorna Leitz, MD;  Location: [place];  Service: Orthopedics;  Laterality: Right;  . TOTAL KNEE ARTHROPLASTY Left 06/26/2016   Procedure: TOTAL KNEE ARTHROPLASTY;  Surgeon: Dorna Leitz, MD;  Location: [place];  Service: Orthopedics;  Laterality: Left;   Allergies  Allergen Reactions  . Tobacco [[place] Tabacum]  Shortness Of Breath   Social History:  reports that he has never smoked. He has never used smokeless tobacco. He reports that he drinks about 7.0 - 8.0 standard drinks of alcohol per week. He reports that he does not use drugs. Family History  Problem Relation Age of Onset  . Arthritis Other   . Heart disease Other   . Hyperlipidemia Other   . Hypertension Other   . Kidney disease Other   . Diabetes Other   . Stroke Other   . Diabetes Mother   . Cancer Father        bone cancer  . Heart disease Father   . Diabetes Sister   . Diabetes Brother   . Stroke Brother       Prior to Admission medications   Medication Sig Start Date End Date Taking? Authorizing Provider  acetaminophen (TYLENOL) 650 MG CR tablet Take 650 mg by mouth every 8 (eight) hours as needed for pain.    [provider]  allopurinol (ZYLOPRIM) 100 MG tablet Take 1 tablet (100 mg total) by mouth 2 (two) times daily. 07/31/16   Medina-Vargas, Monina C, NP  amitriptyline (ELAVIL) 50 MG tablet Take 1 tablet (50 mg total) by mouth at bedtime. 07/31/16   Medina-Vargas, Monina C, NP  b complex-vitamin c-folic acid (NEPHRO-VITE) 0.8 MG TABS tablet Take 1 tablet by mouth daily.    [provider]  carvedilol (COREG) 25 MG tablet Take 1 tablet (25 mg total) by mouth See admin instructions. Give 12.5 tablet BID on M-W-fri, give 1 tablet qd on Tue-Thu-Sun-Sat Patient taking differently: Take 12.5-25 mg by mouth See admin instructions. Give 12.5mg  twice daily MW and 25mg  daily on Tue-Thu-Sun-Sat 12/01/16   Geradine Girt, DO  Cyanocobalamin (VITAMIN B-12) 5000 MCG SUBL Place 1 tablet under the tongue once a week.    [provider]  Dextromethorphan-Guaifenesin (MUCINEX DM) 30-600 MG TB12 Take 1 tablet by mouth daily as needed.     [provider]  docusate sodium (COLACE) 100 MG capsule Take 100 mg by mouth daily as needed.     [provider]  ferric citrate (AURYXIA) 1 GM 210 MG(Fe)  tablet Take 1 tablet (210 mg total) by mouth 3 (three) times daily with meals. 07/31/16   Medina-Vargas, Monina C, NP  gabapentin (NEURONTIN) 100 MG capsule Take 2 capsules (200 mg total) by mouth at bedtime. 200 mg qam and 300 mg upon return from dialysis on M-W-F for neuropathy Patient taking differently: Take 100-200 mg by mouth 2 (two) times daily. 1 capsule q AM and 2 every PM per patient 07/31/16   Medina-Vargas, Monina C, NP  NON FORMULARY Take 1 capsule by mouth as needed (congestion). Slippery elm    [provider]  oxyCODONE-acetaminophen (PERCOCET/ROXICET) 5-325 MG tablet Take 1-2 tablets by mouth every 8 (eight) hours as needed for severe pain. Patient not taking: Reported on 04/03/2017 12/07/16   Antonietta Breach, PA-C  Pramoxine-Menthol-Dimethicone (GOLD BOND MEDICATED ANTI [facility] EX) Apply 1 application topically daily as  needed. Apply to skin twice a day for itching.     [provider]  silver sulfADIAZINE (SILVADENE) 1 % cream Apply 1 application topically daily. Apply to wound Patient not taking: Reported on 04/03/2017 12/07/16   Antonietta Breach, PA-C  simvastatin (ZOCOR) 20 MG tablet Take 1 tablet (20 mg total) by mouth daily. Patient taking differently: Take 20 mg by mouth at bedtime.  07/31/16   Medina-Vargas, Monina C, NP  sodium chloride (OCEAN) 0.65 % SOLN nasal spray Place 1 spray into both nostrils as needed for congestion. 01/31/14   Ghimire, Henreitta Leber, MD  traMADol (ULTRAM) 50 MG tablet Take 50 mg by mouth every 6 (six) hours as needed for moderate pain.    [provider]    Physical Exam: BP 107/66   Pulse 100   Temp 98.2 F (36.8 C) (Oral)   Resp 18   Ht 5\' 9"  (1.753 m)   Wt 117.9 kg   SpO2 98%   BMI 38.40 kg/m   Constitutional elderly male, somnolent but easily arousable no distress, currently undergoing HD Eyes: EOMI, anicteric, normal conjunctivae Cardiovascular: Tachycardic, irregularly irregular rhythm, with no peripheral  edema Respiratory: Normal respiratory effort on room air, clear breath sounds  Abdomen: Soft,non-tender,  Skin: No rash ulcers, or lesions. Without skin tenting  Neurologic: Grossly no focal neuro deficit. Psychiatric:Appropriate affect, and mood. Mental status AAOx3          Labs on Admission:  Basic Metabolic Panel: Recent Labs  Lab 01/01/18 0846 01/01/18 1649  NA 127* 131*  K >7.5* 4.7  CL 95* 96*  CO2 16* 26  GLUCOSE 104* 147*  BUN 101* 41*  CREATININE 16.84* 9.16*  CALCIUM 8.2* 8.9  PHOS  --  4.3   Liver Function Tests: Recent Labs  Lab 01/01/18 0846 01/01/18 1649  AST 9*  --   ALT 15  --   ALKPHOS 99  --   BILITOT 0.5  --   PROT 6.8  --   ALBUMIN 3.2* 2.8*   No results for input(s): LIPASE, AMYLASE in the last 168 hours. No results for input(s): AMMONIA in the last 168 hours. CBC: Recent Labs  Lab 01/01/18 0846 01/01/18 1649  WBC 8.2 5.0  NEUTROABS 5.4  --   HGB 9.9* 10.2*  HCT 31.1* 31.2*  MCV 99.4 96.9  PLT 178 167   Cardiac Enzymes: No results for input(s): CKTOTAL, CKMB, CKMBINDEX, TROPONINI in the last 168 hours.  BNP (last 3 results) No results for input(s): BNP in the last 8760 hours.  ProBNP (last 3 results) No results for input(s): PROBNP in the last 8760 hours.  CBG: Recent Labs  Lab 01/01/18 1711  GLUCAP 111*    Radiological Exams on Admission: Dg Chest Port 1 View  Result Date: 01/01/2018 CLINICAL DATA:  Weakness while standing. EXAM: PORTABLE CHEST 1 VIEW COMPARISON:  04/03/2017 FINDINGS: Artifact overlies the chest. Heart size is at the upper limits of normal. Tortuosity of the aorta. The lungs are clear. The vascularity is normal. No visible effusions. IMPRESSION: No active disease allowing for technical factors. Electronically Signed   By: Nelson Chimes M.D.   On: 01/01/2018 09:04    EKG: Independently reviewed. normal EKG, normal sinus rhythm, unchanged from previous tracings.  Assessment/Plan Present on Admission: .  Hyperkalemia, diminished renal excretion . Anemia of chronic renal failure, unspecified stage . Chronic atrial fibrillation . Diabetes mellitus with neuropathy ([place]) . DOE (dyspnea on exertion)  Active Problems:   Diabetes mellitus  with neuropathy ([place])   AF (paroxysmal atrial fibrillation) (H[facility])   DOE (dyspnea on exertion)   Anemia of chronic renal failure, unspecified stage   CKD (chronic kidney disease) stage V requiring chronic dialysis ([facility])   Anemia of chronic disease   History of subdural hematoma Oct 2018   Chronic atrial fibrillation   Hyperkalemia, diminished renal excretion  Weakness in the setting of significant hyperkalemia, in the setting of missed HD session Underwent emergent HD Status post calcium gluconate, insulin, albuterol in ED Nephrology plans on repeat HD session on 11/21 Repeat BMP after session shows significant improvement with potassium of 4.7 (previously 7) Monitor on telemetry  Dyspnea on exertion, likely secondary to poor adherence to HD Typically adherent however missed last session on Monday after canceling travel plans to [place] Chest x-ray negative for any acute abnormalities Continue to monitor, doing well on room air  ESRD on HD, Monday Wednesday Friday  Typically adherent with therapy States dry weight of 113 kg Reports prior sessions limited by hypotension Nephrology aware, not currently on Midodrine  Paroxysmal A. fib, brief episodes of heart rate in the 120s but resolved with dialysis Continue to monitor on telemetry Continue carvedilol  Off anticoagulation since 11/2016 for spontaneous subdural hematoma  Anemia of chronic disease, stable.   Baseline-10 Serial CBCs  History of cardiac arrest  After missing 2 dialysis sessions with hyperkalemia in May 2016 and PEA arrest in November 2016 when he was also found to be hyperkalemic  Type 2 diabetes Seems controlled high his glucose 147 here Last A1c from 2018 at 6 Not on any  insulin or oral hypoglycemics CBG, sliding scale    DVT prophylaxis: Heparin subcu  Code Status: Full code  Family Communication: No family at bedside  Disposition Plan: Immediate observation to telemetry unit to continue to monitor for resolution of hyperkalemia and volume overload with consecutive HD sessions, anticipate hospital stay for less than 2 midnights.  Consults called: Nephrology       Desiree Hane MD [facility]  Pager [phone]  If 7PM-7AM, please contact night-coverage [email or web address] Password [email or web address]  01/01/2018, 8:12 PM

## 2018-01-01 NOTE — ED Triage Notes (Signed)
Pt brought in by EMS due to having generalized weakness that started yesterday. Pt missed HD treatment Monday. Pt a&ox4.

## 2018-01-01 NOTE — ED Provider Notes (Signed)
[facility] EMERGENCY DEPARTMENT Provider Note   CSN: [id number] Arrival date & time: 01/01/18  0813     History   Chief Complaint Chief Complaint  Patient presents with  . Weakness    HPI Daniel Whitney is a 62 y.o. male who presents with generalized weakness. PMH significant for CHF EF 50-55%, A. Fib not anticoagulated, pulmonary HTN, DM, ESRD on dialysis M, W, F (missed Monday), HTN, HLD, hx of subdural hematoma. He missed dialysis Monday because he was supposed to go to [facility] where he has dialysis set up there but ended up not going. He did complete a full session on Friday. He started to feel weak yesterday. This morning when he got out of bed he couldn't stand up because of weakness. He lives with his brother. EMS was called and he was transported here. He reports mild SOB, abdominal swelling, and bilateral lower leg edema despite using compression stockings. No fever, chills, headache, chest pain or abdominal pain. No blood in the stool.  Goes to [facility]  HPI  Past Medical History:  Diagnosis Date  . Allergic rhinitis 07/07/2014  . Anemia due to other cause 07/07/2014  . Arthritis    "all over"  . Atrial fibrillation ([place]) 12/30/2012  . CHF (congestive heart failure) ([place]) 12/30/2012  . Chronic lower back pain   . Diabetes mellitus with neuropathy ([place])   . Diabetic retinopathy ([place]) 12/30/2012   no medications  . Dysrhythmia    Afib  . ESRD (end stage renal disease) on dialysis ([place]) started in 2013   MWF; [facility]; [employer]  . GERD (gastroesophageal reflux disease)   . Glaucoma   . History of gout    "right big toe"  . Hyperlipidemia   . Hypertension   . Hypothyroid    01/17/16- no longer on meduication  . Morbid obesity ([place]) 12/30/2012  . Myocardial infarction ([place])    "I've had ~ 3; last one was in ~ 10/2013" (01/30/2014)  . OSA (obstructive sleep apnea) 12/30/2012   "lost weight; no longer needed CPAP; retested said I  needed it; didn't followup cause I was feeling fine" (01/30/2014)  . Pulmonary hypertension ([place]) 12/30/2012  . Refusal of blood transfusions as patient is Jehovah's Witness   . Stroke [facility]) ~ 2005; ~ 2005   "they were mild; I didn't even notice I'd had them"; denies residual on 01/30/2014  . Subdural hematoma (Oct 2018)    Coumadin and heparin placed on hold at dc- pt did not FU w/ NS as recommended after dc so not on warfarin when admitted 04/03/17    Patient Active Problem List   Diagnosis Date Noted  . Syncope and collapse 04/03/2017  . History of CVA (cerebrovascular accident) 04/03/2017  . CKD (chronic kidney disease) stage V requiring chronic dialysis ([place]) 04/03/2017  . Hypothyroid 04/03/2017  . Refusal of blood transfusions as patient is Jehovah's Witness 04/03/2017  . Prediabetes-HgbA1c 6.2 04/03/2017  . History of subdural hematoma Oct 2018 04/03/2017  . 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 Chronic atrial fibrillation 04/03/2017  . Subdural hematoma ([place]) 11/29/2016  . Peripheral neuropathy 07/26/2016  . At risk for adverse drug reaction 07/03/2016  . Primary osteoarthritis of left knee 06/26/2016  . Postoperative anemia due to acute blood loss 01/27/2016  . S/P knee replacement 01/24/2016  . Low back pain 02/09/2015  . Bilateral knee pain 01/27/2015  . Atypical atrial flutter ([place]) 01/24/2015  . Acute  on chronic combined systolic and diastolic heart failure ([place]) 01/05/2015  . Hyperkalemia 11/08/2014  . Hypocalcemia 11/08/2014  . Recurrent falls 10/13/2014  . Venous stasis ulcer ([place]) 10/13/2014  . Hypoxia   . Leukocytosis   . Respiratory failure ([place])   . ARDS (adult respiratory distress syndrome) ([place])   . Encounter for central line placement   . Cardiopulmonary arrest ([place])   . Compression fracture of L3 lumbar vertebra 08/04/2014  . Anemia of chronic renal failure, unspecified stage 07/07/2014  . Allergic rhinitis  07/07/2014  . Anemia due to other cause 07/07/2014  . Right rotator cuff tear 04/27/2014  . Acute gouty arthritis 11/11/2013  . Hearing loss in right ear 11/11/2013  . Chronic pain syndrome 11/11/2013  . Right shoulder pain 11/11/2013  . Facial rash 11/01/2013  . Right otitis externa 10/27/2013  . Pulmonary HTN- PA 58 mmHg 04/16/13 09/17/2013  . Right hip pain 08/04/2013  . DOE (dyspnea on exertion) 06/18/2013  . Allergic rhinitis, cause unspecified 06/18/2013  . Carpal tunnel syndrome 06/06/2013  . Primary localized osteoarthrosis, lower leg 06/04/2013  . Preoperative cardiovascular examination 03/10/2013  . Preventative health care 12/30/2012  . CHF (congestive heart failure) ([place]) 12/30/2012  . Diabetic retinopathy ([place]) 12/30/2012  . Morbid obesity ([place]) 12/30/2012  . OSA (obstructive sleep apnea)- on C-pap 12/30/2012  . Bilateral hearing loss 12/30/2012  . Arthritis   . Glaucoma   . Diabetes mellitus with neuropathy ([place])   . Hypertension   . ESRD (end stage renal disease) on dialysis ([place])   . Stroke ([place])   . Hyperlipidemia     Past Surgical History:  Procedure Laterality Date  . AV FISTULA PLACEMENT Left ~ 10/2013   "forearm"  . CARDIAC CATHETERIZATION N/A 01/07/2015   Procedure: Left Heart Cath and Coronary Angiography;  Surgeon: Troy Sine, MD;  Location: [facility];  Service: Cardiovascular;  Laterality: N/A;  . CLOSED REDUCTION HIP DISLOCATION Right 1970's  . JOINT REPLACEMENT    . TOTAL HIP ARTHROPLASTY Right 1980  . TOTAL KNEE ARTHROPLASTY Right 01/24/2016   Procedure: TOTAL KNEE ARTHROPLASTY;  Surgeon: Dorna Leitz, MD;  Location: [facility];  Service: Orthopedics;  Laterality: Right;  . TOTAL KNEE ARTHROPLASTY Left 06/26/2016   Procedure: TOTAL KNEE ARTHROPLASTY;  Surgeon: Dorna Leitz, MD;  Location: [facility];  Service: Orthopedics;  Laterality: Left;        Home Medications    Prior to Admission medications   Medication Sig Start Date End Date  Taking? Authorizing Provider  acetaminophen (TYLENOL) 650 MG CR tablet Take 650 mg by mouth every 8 (eight) hours as needed for pain.    [provider]  allopurinol (ZYLOPRIM) 100 MG tablet Take 1 tablet (100 mg total) by mouth 2 (two) times daily. 07/31/16   Medina-Vargas, Monina C, NP  amitriptyline (ELAVIL) 50 MG tablet Take 1 tablet (50 mg total) by mouth at bedtime. 07/31/16   Medina-Vargas, Monina C, NP  b complex-vitamin c-folic acid (NEPHRO-VITE) 0.8 MG TABS tablet Take 1 tablet by mouth daily.    [provider]  carvedilol (COREG) 25 MG tablet Take 1 tablet (25 mg total) by mouth See admin instructions. Give 12.5 tablet BID on M-W-fri, give 1 tablet qd on Tue-Thu-Sun-Sat Patient taking differently: Take 12.5-25 mg by mouth See admin instructions. Give 12.5mg  twice daily MW and 25mg  daily on Tue-Thu-Sun-Sat 12/01/16   Geradine Girt, DO  Cyanocobalamin (VITAMIN B-12) 5000 MCG SUBL Place 1 tablet under the tongue once  a week.    [provider]  Dextromethorphan-Guaifenesin (MUCINEX DM) 30-600 MG TB12 Take 1 tablet by mouth daily as needed.     [provider]  docusate sodium (COLACE) 100 MG capsule Take 100 mg by mouth daily as needed.     [provider]  ferric citrate (AURYXIA) 1 GM 210 MG(Fe) tablet Take 1 tablet (210 mg total) by mouth 3 (three) times daily with meals. 07/31/16   Medina-Vargas, Monina C, NP  gabapentin (NEURONTIN) 100 MG capsule Take 2 capsules (200 mg total) by mouth at bedtime. 200 mg qam and 300 mg upon return from dialysis on M-W-F for neuropathy Patient taking differently: Take 100-200 mg by mouth 2 (two) times daily. 1 capsule q AM and 2 every PM per patient 07/31/16   Medina-Vargas, Monina C, NP  NON FORMULARY Take 1 capsule by mouth as needed (congestion). [place]    [provider]  oxyCODONE-acetaminophen (PERCOCET/ROXICET) 5-325 MG tablet Take 1-2 tablets by mouth every 8 (eight) hours as needed for  severe pain. Patient not taking: Reported on 04/03/2017 12/07/16   Antonietta Breach, PA-C  Pramoxine-Menthol-Dimethicone (GOLD BOND MEDICATED ANTI [facility] EX) Apply 1 application topically daily as needed. Apply to skin twice a day for itching.     [provider]  silver sulfADIAZINE (SILVADENE) 1 % cream Apply 1 application topically daily. Apply to wound Patient not taking: Reported on 04/03/2017 12/07/16   Antonietta Breach, PA-C  simvastatin (ZOCOR) 20 MG tablet Take 1 tablet (20 mg total) by mouth daily. Patient taking differently: Take 20 mg by mouth at bedtime.  07/31/16   Medina-Vargas, Monina C, NP  sodium chloride (OCEAN) 0.65 % SOLN nasal spray Place 1 spray into both nostrils as needed for congestion. 01/31/14   Ghimire, Henreitta Leber, MD  traMADol (ULTRAM) 50 MG tablet Take 50 mg by mouth every 6 (six) hours as needed for moderate pain.    [provider]    Family History Family History  Problem Relation Age of Onset  . Arthritis Other   . Heart disease Other   . Hyperlipidemia Other   . Hypertension Other   . Kidney disease Other   . Diabetes Other   . Stroke Other   . Diabetes Mother   . Cancer Father        bone cancer  . Heart disease Father   . Diabetes Sister   . Diabetes Brother   . Stroke Brother     Social History Social History   Tobacco Use  . Smoking status: Never Smoker  . Smokeless tobacco: Never Used  Substance Use Topics  . Alcohol use: Yes    Alcohol/week: 7.0 - 8.0 standard drinks    Types: 1 Cans of beer, 3 Shots of liquor, 3 - 4 Standard drinks or equivalent per week    Comment: 07/03/16 1 pint 2-3X/week  . Drug use: No     Allergies   Tobacco [nicotiana tabacum]   Review of Systems Review of Systems  Constitutional: Negative for chills and fever.  Respiratory: Positive for shortness of breath. Negative for cough and wheezing.   Cardiovascular: Positive for leg swelling. Negative for chest pain and palpitations.  Gastrointestinal:  Positive for abdominal distention. Negative for abdominal pain, diarrhea, nausea and vomiting.  Neurological: Positive for weakness. Negative for syncope.  All other systems reviewed and are negative.    Physical Exam Updated Vital Signs BP 108/64 (BP Location: Right Arm)   Pulse 97  Temp 98 F (36.7 C) (Oral)   Resp (!) 28   Ht 5\' 9"  (1.753 m)   Wt 117.9 kg   SpO2 98%   BMI 38.40 kg/m   Physical Exam  Constitutional: He is oriented to person, place, and time. He appears well-developed and well-nourished. No distress.  Calm, cooperative. Pleasant. Morbidly obese  HENT:  Head: Normocephalic and atraumatic.  Eyes: Pupils are equal, round, and reactive to light. Conjunctivae are normal. Right eye exhibits no discharge. Left eye exhibits no discharge. No scleral icterus.  Neck: Normal range of motion.  Cardiovascular: Normal rate and regular rhythm.  Palpable pulse in AVF on left upper extremity  Pulmonary/Chest: Effort normal. No respiratory distress.  Bibasilar rales  Abdominal: Soft. Bowel sounds are normal. He exhibits no distension and no mass. There is no tenderness. There is no rebound and no guarding.  Difficult to assess distension due to obesity  Musculoskeletal:  Bilateral pitting edema of lower extremities  Neurological: He is alert and oriented to person, place, and time.  Skin: Skin is warm and dry.  Psychiatric: He has a normal mood and affect. His behavior is normal.  Nursing note and vitals reviewed.    ED Treatments / Results  Labs (all labs ordered are listed, but only abnormal results are displayed) Labs Reviewed  CBC WITH DIFFERENTIAL/PLATELET - Abnormal; Notable for the following components:      Result Value   RBC 3.13 (*)    Hemoglobin 9.9 (*)    HCT 31.1 (*)    All other components within normal limits  COMPREHENSIVE METABOLIC PANEL - Abnormal; Notable for the following components:   Sodium 127 (*)    Potassium >7.5 (*)    Chloride 95 (*)      CO2 16 (*)    Glucose, Bld 104 (*)    BUN 101 (*)    Creatinine, Ser 16.84 (*)    Calcium 8.2 (*)    Albumin 3.2 (*)    AST 9 (*)    GFR calc non Af Amer 3 (*)    GFR calc Af Amer 3 (*)    Anion gap 16 (*)    All other components within normal limits  GLUCOSE, RANDOM  RENAL FUNCTION PANEL  CBC    EKG None  Radiology Dg Chest Port 1 View  Result Date: 01/01/2018 CLINICAL DATA:  Weakness while standing. EXAM: PORTABLE CHEST 1 VIEW COMPARISON:  04/03/2017 FINDINGS: Artifact overlies the chest. Heart size is at the upper limits of normal. Tortuosity of the aorta. The lungs are clear. The vascularity is normal. No visible effusions. IMPRESSION: No active disease allowing for technical factors. Electronically Signed   By: Nelson Chimes M.D.   On: 01/01/2018 09:04    Procedures Procedures (including critical care time)  CRITICAL CARE Performed by: Recardo Evangelist   Total critical care time: 35 minutes  Critical care time was exclusive of separately billable procedures and treating other patients.  Critical care was necessary to treat or prevent imminent or life-threatening deterioration.  Critical care was time spent personally by me on the following activities: development of treatment plan with patient and/or surrogate as well as nursing, discussions with consultants, evaluation of patient's response to treatment, examination of patient, obtaining history from patient or surrogate, ordering and performing treatments and interventions, ordering and review of laboratory studies, ordering and review of radiographic studies, pulse oximetry and re-evaluation of patient's condition.   Medications Ordered in ED Medications  calcium gluconate inj  10% (1 g) URGENT USE ONLY! (1 g Intravenous Not Given 01/01/18 1005)  albuterol (PROVENTIL) (2.5 MG/3ML) 0.083% nebulizer solution 10 mg (10 mg Nebulization Not Given 01/01/18 1005)  insulin aspart (novoLOG) injection 5 Units (5 Units  Intravenous Not Given 01/01/18 1005)    And  dextrose 50 % solution 50 mL (50 mLs Intravenous Not Given 01/01/18 1005)  dextrose 10 % infusion ( Intravenous Not Given 01/01/18 1006)  Chlorhexidine Gluconate Cloth 2 % PADS 6 each (has no administration in time range)  pentafluoroprop-tetrafluoroeth (GEBAUERS) aerosol 1 application (has no administration in time range)  lidocaine (PF) (XYLOCAINE) 1 % injection 5 mL (has no administration in time range)  lidocaine-prilocaine (EMLA) cream 1 application (has no administration in time range)  0.9 %  sodium chloride infusion (has no administration in time range)  0.9 %  sodium chloride infusion (has no administration in time range)  heparin injection 1,000 Units (has no administration in time range)  alteplase (CATHFLO ACTIVASE) injection 2 mg (has no administration in time range)     Initial Impression / Assessment and Plan / ED Course  I have reviewed the triage vital signs and the nursing notes.  Pertinent labs & imaging results that were available during my care of the patient were reviewed by me and considered in my medical decision making (see chart for details).  62 year old male presents with generalized weakness and SOB after missing dialysis. BP is soft but otherwise vitals are normal. EKG is SR and appears slightly wider than normal. CMP is remarkable for K >7.5 and much worsening renal function. CBC is remarkable for anemia which is around his baseline. Hyperkalemia order set was utilized. Shared visit with Dr. Rex Kras. Discussed with Dr. Hollie Salk who will have ptt emergently dialyzed. Discussed with Dr. Lonny Prude with [facility] who will admit.  Final Clinical Impressions(s) / ED Diagnoses   Final diagnoses:  Hyperkalemia  ESRD (end stage renal disease) [facility])  Generalized weakness    ED Discharge Orders    None       Recardo Evangelist, PA-C 01/01/18 [place], Wenda Overland, MD 01/08/18 [phone]

## 2018-01-01 NOTE — ED Notes (Signed)
Janetta Hora, PA notified that pt potassium resulted at 7.5.

## 2018-01-02 DIAGNOSIS — N189 Chronic kidney disease, unspecified: Secondary | ICD-10-CM

## 2018-01-02 DIAGNOSIS — N186 End stage renal disease: Secondary | ICD-10-CM

## 2018-01-02 DIAGNOSIS — D631 Anemia in chronic kidney disease: Secondary | ICD-10-CM

## 2018-01-02 DIAGNOSIS — I480 Paroxysmal atrial fibrillation: Secondary | ICD-10-CM | POA: Diagnosis not present

## 2018-01-02 DIAGNOSIS — E875 Hyperkalemia: Secondary | ICD-10-CM | POA: Diagnosis not present

## 2018-01-02 DIAGNOSIS — I120 Hypertensive chronic kidney disease with stage 5 chronic kidney disease or end stage renal disease: Secondary | ICD-10-CM | POA: Diagnosis not present

## 2018-01-02 DIAGNOSIS — N2581 Secondary hyperparathyroidism of renal origin: Secondary | ICD-10-CM | POA: Diagnosis not present

## 2018-01-02 DIAGNOSIS — Z992 Dependence on renal dialysis: Secondary | ICD-10-CM | POA: Diagnosis not present

## 2018-01-02 LAB — BASIC METABOLIC PANEL
Anion gap: 10 (ref 5–15)
BUN: 46 mg/dL — AB (ref 8–23)
CALCIUM: 8.5 mg/dL — AB (ref 8.9–10.3)
CO2: 26 mmol/L (ref 22–32)
CREATININE: 10.62 mg/dL — AB (ref 0.61–1.24)
Chloride: 95 mmol/L — ABNORMAL LOW (ref 98–111)
GFR calc Af Amer: 5 mL/min — ABNORMAL LOW (ref >60)
GFR calc non Af Amer: 4 mL/min — ABNORMAL LOW (ref >60)
GLUCOSE: 136 mg/dL — AB (ref 70–99)
POTASSIUM: 5.4 mmol/L — AB (ref 3.5–5.1)
SODIUM: 131 mmol/L — AB (ref 135–145)

## 2018-01-02 LAB — CBC
HCT: 30.3 % — ABNORMAL LOW (ref 39.0–52.0)
HEMOGLOBIN: 9.8 g/dL — AB (ref 13.0–17.0)
MCH: 31.9 pg (ref 26.0–34.0)
MCHC: 32.3 g/dL (ref 30.0–36.0)
MCV: 98.7 fL (ref 80.0–100.0)
Platelets: 162 10*3/uL (ref 150–400)
RBC: 3.07 MIL/uL — ABNORMAL LOW (ref 4.22–5.81)
RDW: 14.8 % (ref 11.5–15.5)
WBC: 5.4 10*3/uL (ref 4.0–10.5)
nRBC: 0 % (ref 0.0–0.2)

## 2018-01-02 LAB — HIV ANTIBODY (ROUTINE TESTING W REFLEX): HIV SCREEN 4TH GENERATION: NONREACTIVE

## 2018-01-02 LAB — MRSA PCR SCREENING: MRSA BY PCR: NEGATIVE

## 2018-01-02 LAB — GLUCOSE, CAPILLARY: GLUCOSE-CAPILLARY: 126 mg/dL — AB (ref 70–99)

## 2018-01-02 NOTE — Discharge Instructions (Signed)
Follow with Primary MD [place], Dibas, MD in 7 days   Get CBC, CMP,  checked  by Primary MD next visit.    Activity: As tolerated with Full fall precautions use walker/cane & assistance as needed   Disposition Home    Diet: Renal/carbohydrate modified with 1200 cc fluid restriction, with feeding assistance and aspiration precautions.  For Heart failure patients - Check your Weight same time everyday, if you gain over 2 pounds, or you develop in leg swelling, experience more shortness of breath or chest pain, call your Primary MD immediately. Follow Cardiac Low Salt Diet and 1.5 lit/day fluid restriction.   On your next visit with your primary care physician please Get Medicines reviewed and adjusted.   Please request your Prim.MD to go over all Hospital Tests and Procedure/Radiological results at the follow up, please get all Hospital records sent to your Prim MD by signing hospital release before you go home.   If you experience worsening of your admission symptoms, develop shortness of breath, life threatening emergency, suicidal or homicidal thoughts you must seek medical attention immediately by calling 911 or calling your MD immediately  if symptoms less severe.  You Must read complete instructions/literature along with all the possible adverse reactions/side effects for all the Medicines you take and that have been prescribed to you. Take any new Medicines after you have completely understood and accpet all the possible adverse reactions/side effects.   Do not drive, operating heavy machinery, perform activities at heights, swimming or participation in water activities or provide baby sitting services if your were admitted for syncope or siezures until you have seen by Primary MD or a Neurologist and advised to do so again.  Do not drive when taking Pain medications.    Do not take more than prescribed Pain, Sleep and Anxiety Medications  Special Instructions: If you have  smoked or chewed Tobacco  in the last 2 yrs please stop smoking, stop any regular Alcohol  and or any Recreational drug use.  Wear Seat belts while driving.   Please note  You were cared for by a hospitalist during your hospital stay. If you have any questions about your discharge medications or the care you received while you were in the hospital after you are discharged, you can call the unit and asked to speak with the hospitalist on call if the hospitalist that took care of you is not available. Once you are discharged, your primary care physician will handle any further medical issues. Please note that NO REFILLS for any discharge medications will be authorized once you are discharged, as it is imperative that you return to your primary care physician (or establish a relationship with a primary care physician if you do not have one) for your aftercare needs so that they can reassess your need for medications and monitor your lab values.

## 2018-01-02 NOTE — Progress Notes (Signed)
Reece Agar to be D/C'd Home per MD order.  Discussed with the patient and all questions fully answered.  VSS, Skin clean, dry and intact without evidence of skin break down, no evidence of skin tears noted. IV catheter discontinued intact. Site without signs and symptoms of complications. Dressing and pressure applied.  An After Visit Summary was printed and given to the patient. Patient received prescription.  D/c education completed with patient/family including follow up instructions, medication list, d/c activities limitations if indicated, with other d/c instructions as indicated by MD - patient able to verbalize understanding, all questions fully answered.   Patient instructed to return to ED, call 911, or call MD for any changes in condition.   Patient escorted via [place], and D/C home via private auto.  Luci Bank 01/02/2018 6:41 PM

## 2018-01-02 NOTE — Progress Notes (Addendum)
  [facility] Progress Note   Assessment/ Plan:   Dialysis orders: [facility] 4 hr 15 min 2K/ 3.5 Ca F180 113 kg EDW AVF Mircera 50 q 2 weeks, last given 11/11.  Assessment/Plan: 1 Weakness/ hyperkalemia: K > 7.5 in the setting of missed HD.  HD yesterday and today (11/21).   2 ESRD: MWF normally, missed Monday.  Talked with OP HD center- apparently he had spot Tuesday but no-showed.  Discussed with [facility] today, he can come to normal chair time tomorrow (Friday).  3 Hypertension: usually has some IDH, will monitor.  Not on midodrine as OP according to med list 4. Anemia of ESRD: Hgb adequate, got ESA 11/11 5. Metabolic Bone Disease: no Vit D products in center, on Auryxia as OP 6.  Nutrition: renal diet 7.  Afib- transient, on BB, off AC due to subdural 2018 8.  Access- Some scabbing which appeared in last 2 weeks- will need OP [facility] appt (follow up access flows at OP center). 9.  Dispo: OK from renal perspective to d/c after dialysis and to resume normal OP schedule Friday.  Subjective:    S/p HD yesterday, had a short run of Afib on HD which resolved spontaneously.  On coreg.  Not on AC d/t subdural in 2018.  Feeling better today.     Objective:   BP (!) 98/57   Pulse 86   Temp 97.8 F (36.6 C) (Oral)   Resp 20   Ht '5\' 9"'$  (1.753 m)   Wt 116.2 kg   SpO2 96%   BMI 37.83 kg/m   Physical Exam: GEN NAD, lying in bed, sleeping but arousable HEENT EOMI PERRL NECK  JVD improved PULM clear anteriorly CV irregular II/VI systolic murmur ABD obese, NABS EXT trace UE edema, some LE edema NEURO AAO x 3 ACCESS L FA AVF, some scabbing  Labs: BMET Recent Labs  Lab 01/01/18 0846 01/01/18 1649 01/02/18 0305  NA 127* 131* 131*  K >7.5* 4.7 5.4*  CL 95* 96* 95*  CO2 16* 26 26  GLUCOSE 104* 147* 136*  BUN 101* 41* 46*  CREATININE 16.84* 9.16* 10.62*  CALCIUM 8.2* 8.9 8.5*  PHOS  --  4.3  --    CBC Recent Labs  Lab 01/01/18 0846 01/01/18 1649 01/02/18 0305   WBC 8.2 5.0 5.4  NEUTROABS 5.4  --   --   HGB 9.9* 10.2* 9.8*  HCT 31.1* 31.2* 30.3*  MCV 99.4 96.9 98.7  PLT 178 167 162    '@IMGRELPRIORS'$ @ Medications:    . albuterol  10 mg Nebulization Once  . allopurinol  100 mg Oral BID  . amitriptyline  50 mg Oral QHS  . calcium gluconate  1 g Intravenous Once  . carvedilol  12.5-25 mg Oral See admin instructions  . Chlorhexidine Gluconate Cloth  6 each Topical Q0600  . insulin aspart  5 Units Intravenous Once   And  . dextrose  1 ampule Intravenous Once  . ferric citrate  210 mg Oral TID WC  . gabapentin  100 mg Oral Daily  . gabapentin  200 mg Oral QHS  . heparin  5,000 Units Subcutaneous Q8H  . insulin aspart  0-9 Units Subcutaneous TID WC  . simvastatin  20 mg Oral QHS     Madelon Lips MD [facility] pgr [phone] 01/02/2018, 9:44 AM

## 2018-01-02 NOTE — Discharge Summary (Signed)
Daniel Whitney, is a 62 y.o. male  DOB 1955/05/17  MRN [id number].  Admission date:  01/01/2018  Admitting Physician  Desiree Hane, MD  Discharge Date:  01/02/2018   Primary MD  [name], MD  Recommendations for primary care physician for things to follow:  -Continue with hemodialysis on the scheduled, he should have spot for tomorrow at [facility].  Admission Diagnosis  Hyperkalemia [E87.5] ESRD (end stage renal disease) ([place]) [N18.6] Generalized weakness [R53.1] Hyperkalemia, diminished renal excretion [E87.5]   Discharge Diagnosis  Hyperkalemia [E87.5] ESRD (end stage renal disease) ([place]) [N18.6] Generalized weakness [R53.1] Hyperkalemia, diminished renal excretion [E87.5]    Active Problems:   Diabetes mellitus with neuropathy (HCC)   AF (paroxysmal atrial fibrillation) (HCC)   DOE (dyspnea on exertion)   Anemia of chronic renal failure, unspecified stage   CKD (chronic kidney disease) stage V requiring chronic dialysis (HCC)   Anemia of chronic disease   History of subdural hematoma Oct 2018   Chronic atrial fibrillation   Hyperkalemia, diminished renal excretion      Past Medical History:  Diagnosis Date  . Allergic rhinitis 07/07/2014  . Anemia due to other cause 07/07/2014  . Arthritis    "all over"  . Atrial fibrillation ([place]) 12/30/2012  . CHF (congestive heart failure) ([place]) 12/30/2012  . Chronic lower back pain   . Diabetes mellitus with neuropathy ([place])   . Diabetic retinopathy ([place]) 12/30/2012   no medications  . Dysrhythmia    Afib  . ESRD (end stage renal disease) on dialysis ([place]) started in 2013   MWF; [facility]; [employer]  . GERD (gastroesophageal reflux disease)   . Glaucoma   . History of gout    "right big toe"  . Hyperlipidemia   . Hypertension   . Hypothyroid    01/17/16- no longer on meduication  . Morbid obesity ([place]) 12/30/2012  . Myocardial  infarction ([place])    "I've had ~ 3; last one was in ~ 10/2013" (01/30/2014)  . OSA (obstructive sleep apnea) 12/30/2012   "lost weight; no longer needed CPAP; retested said I needed it; didn't followup cause I was feeling fine" (01/30/2014)  . Pulmonary hypertension ([place]) 12/30/2012  . Refusal of blood transfusions as patient is Jehovah's Witness   . Stroke [facility]) ~ 2005; ~ 2005   "they were mild; I didn't even notice I'd had them"; denies residual on 01/30/2014  . Subdural hematoma (Oct 2018)    Coumadin and heparin placed on hold at dc- pt did not FU w/ NS as recommended after dc so not on warfarin when admitted 04/03/17    Past Surgical History:  Procedure Laterality Date  . AV FISTULA PLACEMENT Left ~ 10/2013   "forearm"  . CARDIAC CATHETERIZATION N/A 01/07/2015   Procedure: Left Heart Cath and Coronary Angiography;  Surgeon: Troy Sine, MD;  Location: [facility];  Service: Cardiovascular;  Laterality: N/A;  . CLOSED REDUCTION HIP DISLOCATION Right 1970's  . JOINT REPLACEMENT    . TOTAL HIP  ARTHROPLASTY Right 1980  . TOTAL KNEE ARTHROPLASTY Right 01/24/2016   Procedure: TOTAL KNEE ARTHROPLASTY;  Surgeon: Dorna Leitz, MD;  Location: [place];  Service: Orthopedics;  Laterality: Right;  . TOTAL KNEE ARTHROPLASTY Left 06/26/2016   Procedure: TOTAL KNEE ARTHROPLASTY;  Surgeon: Dorna Leitz, MD;  Location: [place];  Service: Orthopedics;  Laterality: Left;       History of present illness and  Hospital Course:     Kindly see H&P for history of present illness and admission details, please review complete Labs, Consult reports and Test reports for all details in brief  HPI  from the history and physical done on the day of admission 01/01/2018  HPI: Daniel Whitney is a 62 y.o. male with medical history significant for paroxysmal atrial fibrillation, ESRD on HD Monday Wednesday Friday, previous history of cardiac arrest, history of subdural hematoma, hypothyroidism, HTN,  hyperlipidemia, GERD, type 2 diabetes who presents on 01/01/2018 with 1 day of worsening leg weakness shortness of breath with exertion was found to have elevated hyperkalemia after missing one dialysis session as an outpatient.  Patient was planning to go on vacation to [place] this past Monday but canceled a trip was unable to resume his typical outpatient HD session as there was no availability.  He was in his usual state of health until 11/19 when he noticed significant weakness in his lower legs and inability to stand additionally he was more short of breath than usual which prompted him to come to ED for further evaluation.  Denies any fevers, chills, cough, chest pain, headache, abdominal pain, diarrhea, constipation..    ED Course: In the ED he was afebrile, tachypneic 20s, heart rate 130s with sinus tachycardia on EKG.  Lab work was significant for hemoglobin of 9.9, sodium 127, potassium 7.5.  Patient was given temporizing medications status post calcium gluconate, albuterol, insulin and sent for emergent HD from the ED.  Hospitalalsit service was called for admission and further management  Hospital Course   Hyperkalemia -From missing hemodialysis, patient was dialyzed back-to-back yesterday and today, potassium is 5.4 today, patient can be discharged today to continue his dialysis on Monday Wednesday Friday schedule.  Paroxysmal A. fib, brief episodes of heart rate in the 120s but resolved with dialysis Continue carvedilol  Off anticoagulation since 11/2016 for spontaneous subdural hematoma  Anemia of chronic disease, stable.   Baseline-10  History of cardiac arrest  After missing 2 dialysis sessions with hyperkalemia in May 2016 and PEA arrest in November 2016 when he was also found to be hyperkalemic  Type 2 diabetes Seems controlled high his glucose 147 here Last A1c from 2018 at 6 Not on any insulin or oral hypoglycemics Was on insulin sliding scale during hospital  stay, but overall his blood glucose has been controlled without any insulin requirement, he will be discharged on carb/renal diet      Discharge Condition:  stable   Follow UP      Discharge Instructions  and  Discharge Medications     Discharge Instructions    Discharge instructions   Complete by:  As directed    Follow with Primary MD Koirala, Dibas, MD in 7 days   Get CBC, CMP,  checked  by Primary MD next visit.    Activity: As tolerated with Full fall precautions use walker/cane & assistance as needed   Disposition Home    Diet: Renal/carbohydrate modified with 1200 cc fluid restriction, with feeding assistance and aspiration precautions.  For Heart  failure patients - Check your Weight same time everyday, if you gain over 2 pounds, or you develop in leg swelling, experience more shortness of breath or chest pain, call your Primary MD immediately. Follow Cardiac Low Salt Diet and 1.5 lit/day fluid restriction.   On your next visit with your primary care physician please Get Medicines reviewed and adjusted.   Please request your Prim.MD to go over all Hospital Tests and Procedure/Radiological results at the follow up, please get all Hospital records sent to your Prim MD by signing hospital release before you go home.   If you experience worsening of your admission symptoms, develop shortness of breath, life threatening emergency, suicidal or homicidal thoughts you must seek medical attention immediately by calling 911 or calling your MD immediately  if symptoms less severe.  You Must read complete instructions/literature along with all the possible adverse reactions/side effects for all the Medicines you take and that have been prescribed to you. Take any new Medicines after you have completely understood and accpet all the possible adverse reactions/side effects.   Do not drive, operating heavy machinery, perform activities at heights, swimming or participation in  water activities or provide baby sitting services if your were admitted for syncope or siezures until you have seen by Primary MD or a Neurologist and advised to do so again.  Do not drive when taking Pain medications.    Do not take more than prescribed Pain, Sleep and Anxiety Medications  Special Instructions: If you have smoked or chewed Tobacco  in the last 2 yrs please stop smoking, stop any regular Alcohol  and or any Recreational drug use.  Wear Seat belts while driving.   Please note  You were cared for by a hospitalist during your hospital stay. If you have any questions about your discharge medications or the care you received while you were in the hospital after you are discharged, you can call the unit and asked to speak with the hospitalist on call if the hospitalist that took care of you is not available. Once you are discharged, your primary care physician will handle any further medical issues. Please note that NO REFILLS for any discharge medications will be authorized once you are discharged, as it is imperative that you return to your primary care physician (or establish a relationship with a primary care physician if you do not have one) for your aftercare needs so that they can reassess your need for medications and monitor your lab values.   Increase activity slowly   Complete by:  As directed      Allergies as of 01/02/2018      Reactions   Tobacco [nicotiana Tabacum] Shortness Of Breath      Medication List    TAKE these medications   acetaminophen 650 MG CR tablet Commonly known as:  TYLENOL Take 650 mg by mouth every 8 (eight) hours as needed for pain.   allopurinol 100 MG tablet Commonly known as:  ZYLOPRIM Take 1 tablet (100 mg total) by mouth 2 (two) times daily.   amitriptyline 50 MG tablet Commonly known as:  ELAVIL Take 1 tablet (50 mg total) by mouth at bedtime.   b complex-vitamin c-folic acid 0.8 MG Tabs tablet Take 1 tablet by mouth daily.    carvedilol 25 MG tablet Commonly known as:  COREG Take 1 tablet (25 mg total) by mouth See admin instructions. Give 12.5 tablet BID on M-W-fri, give 1 tablet qd on Tue-Thu-Sun-Sat What changed:  how much to take  additional instructions   docusate sodium 100 MG capsule Commonly known as:  COLACE Take 100 mg by mouth daily as needed.   ferric citrate 1 GM 210 MG(Fe) tablet Commonly known as:  AURYXIA Take 1 tablet (210 mg total) by mouth 3 (three) times daily with meals.   gabapentin 100 MG capsule Commonly known as:  NEURONTIN Take 2 capsules (200 mg total) by mouth at bedtime. 200 mg qam and 300 mg upon return from dialysis on M-W-F for neuropathy What changed:    how much to take  when to take this  additional instructions   GOLD BOND MEDICATED ANTI ITCH EX Apply 1 application topically daily as needed. Apply to skin twice a day for itching.   MUCINEX DM 30-600 MG Tb12 Take 1 tablet by mouth daily as needed.   NON FORMULARY Take 1 capsule by mouth as needed (congestion). Slippery elm   oxyCODONE-acetaminophen 5-325 MG tablet Commonly known as:  PERCOCET/ROXICET Take 1-2 tablets by mouth every 8 (eight) hours as needed for severe pain.   silver sulfADIAZINE 1 % cream Commonly known as:  SILVADENE Apply 1 application topically daily. Apply to wound   simvastatin 20 MG tablet Commonly known as:  ZOCOR Take 1 tablet (20 mg total) by mouth daily. What changed:  when to take this   sodium chloride 0.65 % Soln nasal spray Commonly known as:  OCEAN Place 1 spray into both nostrils as needed for congestion.   traMADol 50 MG tablet Commonly known as:  ULTRAM Take 50 mg by mouth every 6 (six) hours as needed for moderate pain.   Vitamin B-12 5000 MCG Subl Place 1 tablet under the tongue once a week.         Diet and Activity recommendation: See Discharge Instructions above   Consults obtained -  renal   Major procedures and Radiology Reports -  PLEASE review detailed and final reports for all details, in brief -      Dg Chest Port 1 View  Result Date: 01/01/2018 CLINICAL DATA:  Weakness while standing. EXAM: PORTABLE CHEST 1 VIEW COMPARISON:  04/03/2017 FINDINGS: Artifact overlies the chest. Heart size is at the upper limits of normal. Tortuosity of the aorta. The lungs are clear. The vascularity is normal. No visible effusions. IMPRESSION: No active disease allowing for technical factors. Electronically Signed   By: Nelson Chimes M.D.   On: 01/01/2018 09:04    Micro Results    Recent Results (from the past 240 hour(s))  MRSA PCR Screening     Status: None   Collection Time: 01/01/18 11:32 PM  Result Value Ref Range Status   MRSA by PCR NEGATIVE NEGATIVE Final    Comment:        The GeneXpert MRSA Assay (FDA approved for NASAL specimens only), is one component of a comprehensive MRSA colonization surveillance program. It is not intended to diagnose MRSA infection nor to guide or monitor treatment for MRSA infections. Performed at [facility], [place] [address] [place], [place] [address]        Today   Subjective:   Daniel Whitney today has no headache,no chest abdominal pain,no new weakness tingling or numbness, feels much better wants to go home today.  Objective:   Blood pressure 104/61, pulse 95, temperature 98 F (36.7 C), resp. rate 16, height 5\' 9"  (1.753 m), weight 113.4 kg, SpO2 98 %.   Intake/Output Summary (Last 24 hours) at 01/02/2018 1503 Last  data filed at 01/02/2018 1231 Gross per 24 hour  Intake -  Output 3000 ml  Net -3000 ml    Exam Awake Alert, Oriented x 3, No new F.N deficits, Normal affect Symmetrical Chest wall movement, Good air movement bilaterally, CTAB RRR,No Gallops,Rubs or new Murmurs, No Parasternal Heave +ve B.Sounds, Abd Soft, Non tender, No rebound -guarding or rigidity. No Cyanosis, Clubbing or edema, No new Rash or bruise  Data Review   CBC w Diff:  Lab  Results  Component Value Date   WBC 5.4 01/02/2018   HGB 9.8 (L) 01/02/2018   HCT 30.3 (L) 01/02/2018   PLT 162 01/02/2018   LYMPHOPCT 18 01/01/2018   MONOPCT 12 01/01/2018   EOSPCT 4 01/01/2018   BASOPCT 1 01/01/2018    CMP:  Lab Results  Component Value Date   NA 131 (L) 01/02/2018   K 5.4 (H) 01/02/2018   CL 95 (L) 01/02/2018   CO2 26 01/02/2018   BUN 46 (H) 01/02/2018   CREATININE 10.62 (H) 01/02/2018   PROT 6.8 01/01/2018   ALBUMIN 2.8 (L) 01/01/2018   BILITOT 0.5 01/01/2018   ALKPHOS 99 01/01/2018   AST 9 (L) 01/01/2018   ALT 15 01/01/2018  .   Total Time in preparing paper work, data evaluation and todays exam - 30 minutes  Phillips Climes M.D on 01/02/2018 at 3:03 PM  [facility]   Office  [phone]

## 2018-01-02 NOTE — Procedures (Signed)
Patient seen and examined on Hemodialysis. QB 400 mL/ min  UF goal 3L   Tolerating well. Close attention to HR during rx  Treatment adjusted as needed.  Madelon Lips MD [facility] pgr [phone] 9:50 AM

## 2018-01-03 DIAGNOSIS — N186 End stage renal disease: Secondary | ICD-10-CM | POA: Diagnosis not present

## 2018-01-03 DIAGNOSIS — N2581 Secondary hyperparathyroidism of renal origin: Secondary | ICD-10-CM | POA: Diagnosis not present

## 2018-01-03 DIAGNOSIS — D631 Anemia in chronic kidney disease: Secondary | ICD-10-CM | POA: Diagnosis not present

## 2018-01-03 DIAGNOSIS — Z992 Dependence on renal dialysis: Secondary | ICD-10-CM | POA: Diagnosis not present

## 2018-01-06 DIAGNOSIS — N186 End stage renal disease: Secondary | ICD-10-CM | POA: Diagnosis not present

## 2018-01-06 DIAGNOSIS — D631 Anemia in chronic kidney disease: Secondary | ICD-10-CM | POA: Diagnosis not present

## 2018-01-06 DIAGNOSIS — N2581 Secondary hyperparathyroidism of renal origin: Secondary | ICD-10-CM | POA: Diagnosis not present

## 2018-01-07 DIAGNOSIS — N2581 Secondary hyperparathyroidism of renal origin: Secondary | ICD-10-CM | POA: Diagnosis not present

## 2018-01-07 DIAGNOSIS — D631 Anemia in chronic kidney disease: Secondary | ICD-10-CM | POA: Diagnosis not present

## 2018-01-07 DIAGNOSIS — N186 End stage renal disease: Secondary | ICD-10-CM | POA: Diagnosis not present

## 2018-01-10 DIAGNOSIS — D631 Anemia in chronic kidney disease: Secondary | ICD-10-CM | POA: Diagnosis not present

## 2018-01-10 DIAGNOSIS — N2581 Secondary hyperparathyroidism of renal origin: Secondary | ICD-10-CM | POA: Diagnosis not present

## 2018-01-10 DIAGNOSIS — N186 End stage renal disease: Secondary | ICD-10-CM | POA: Diagnosis not present

## 2018-01-12 DIAGNOSIS — Z992 Dependence on renal dialysis: Secondary | ICD-10-CM | POA: Diagnosis not present

## 2018-01-12 DIAGNOSIS — I120 Hypertensive chronic kidney disease with stage 5 chronic kidney disease or end stage renal disease: Secondary | ICD-10-CM | POA: Diagnosis not present

## 2018-01-12 DIAGNOSIS — N186 End stage renal disease: Secondary | ICD-10-CM | POA: Diagnosis not present

## 2018-01-13 DIAGNOSIS — D631 Anemia in chronic kidney disease: Secondary | ICD-10-CM | POA: Diagnosis not present

## 2018-01-13 DIAGNOSIS — N2581 Secondary hyperparathyroidism of renal origin: Secondary | ICD-10-CM | POA: Diagnosis not present

## 2018-01-13 DIAGNOSIS — N186 End stage renal disease: Secondary | ICD-10-CM | POA: Diagnosis not present

## 2018-01-14 DIAGNOSIS — I771 Stricture of artery: Secondary | ICD-10-CM | POA: Diagnosis not present

## 2018-01-14 DIAGNOSIS — T82868A Thrombosis of vascular prosthetic devices, implants and grafts, initial encounter: Secondary | ICD-10-CM | POA: Diagnosis not present

## 2018-01-14 DIAGNOSIS — T82858A Stenosis of vascular prosthetic devices, implants and grafts, initial encounter: Secondary | ICD-10-CM | POA: Diagnosis not present

## 2018-01-14 DIAGNOSIS — N186 End stage renal disease: Secondary | ICD-10-CM | POA: Diagnosis not present

## 2018-01-14 DIAGNOSIS — I120 Hypertensive chronic kidney disease with stage 5 chronic kidney disease or end stage renal disease: Secondary | ICD-10-CM | POA: Diagnosis not present

## 2018-01-14 DIAGNOSIS — Z992 Dependence on renal dialysis: Secondary | ICD-10-CM | POA: Diagnosis not present

## 2018-01-14 DIAGNOSIS — E1122 Type 2 diabetes mellitus with diabetic chronic kidney disease: Secondary | ICD-10-CM | POA: Diagnosis not present

## 2018-01-14 DIAGNOSIS — T82898A Other specified complication of vascular prosthetic devices, implants and grafts, initial encounter: Secondary | ICD-10-CM | POA: Diagnosis not present

## 2018-01-15 DIAGNOSIS — Z992 Dependence on renal dialysis: Secondary | ICD-10-CM | POA: Diagnosis not present

## 2018-01-15 DIAGNOSIS — E1122 Type 2 diabetes mellitus with diabetic chronic kidney disease: Secondary | ICD-10-CM | POA: Diagnosis not present

## 2018-01-15 DIAGNOSIS — M1990 Unspecified osteoarthritis, unspecified site: Secondary | ICD-10-CM | POA: Diagnosis present

## 2018-01-15 DIAGNOSIS — E8770 Fluid overload, unspecified: Secondary | ICD-10-CM | POA: Diagnosis not present

## 2018-01-15 DIAGNOSIS — I2510 Atherosclerotic heart disease of native coronary artery without angina pectoris: Secondary | ICD-10-CM | POA: Diagnosis not present

## 2018-01-15 DIAGNOSIS — Z8679 Personal history of other diseases of the circulatory system: Secondary | ICD-10-CM | POA: Diagnosis not present

## 2018-01-15 DIAGNOSIS — D638 Anemia in other chronic diseases classified elsewhere: Secondary | ICD-10-CM | POA: Diagnosis present

## 2018-01-15 DIAGNOSIS — R0902 Hypoxemia: Secondary | ICD-10-CM | POA: Diagnosis present

## 2018-01-15 DIAGNOSIS — Z452 Encounter for adjustment and management of vascular access device: Secondary | ICD-10-CM | POA: Diagnosis not present

## 2018-01-15 DIAGNOSIS — R918 Other nonspecific abnormal finding of lung field: Secondary | ICD-10-CM | POA: Diagnosis not present

## 2018-01-15 DIAGNOSIS — I480 Paroxysmal atrial fibrillation: Secondary | ICD-10-CM | POA: Diagnosis not present

## 2018-01-15 DIAGNOSIS — Z7984 Long term (current) use of oral hypoglycemic drugs: Secondary | ICD-10-CM | POA: Diagnosis not present

## 2018-01-15 DIAGNOSIS — H409 Unspecified glaucoma: Secondary | ICD-10-CM | POA: Diagnosis present

## 2018-01-15 DIAGNOSIS — Z01810 Encounter for preprocedural cardiovascular examination: Secondary | ICD-10-CM | POA: Diagnosis not present

## 2018-01-15 DIAGNOSIS — R531 Weakness: Secondary | ICD-10-CM | POA: Diagnosis not present

## 2018-01-15 DIAGNOSIS — E11319 Type 2 diabetes mellitus with unspecified diabetic retinopathy without macular edema: Secondary | ICD-10-CM | POA: Diagnosis present

## 2018-01-15 DIAGNOSIS — I5032 Chronic diastolic (congestive) heart failure: Secondary | ICD-10-CM | POA: Diagnosis present

## 2018-01-15 DIAGNOSIS — B999 Unspecified infectious disease: Secondary | ICD-10-CM | POA: Diagnosis not present

## 2018-01-15 DIAGNOSIS — E872 Acidosis: Secondary | ICD-10-CM | POA: Diagnosis present

## 2018-01-15 DIAGNOSIS — I721 Aneurysm of artery of upper extremity: Secondary | ICD-10-CM | POA: Diagnosis not present

## 2018-01-15 DIAGNOSIS — I120 Hypertensive chronic kidney disease with stage 5 chronic kidney disease or end stage renal disease: Secondary | ICD-10-CM | POA: Diagnosis not present

## 2018-01-15 DIAGNOSIS — I272 Pulmonary hypertension, unspecified: Secondary | ICD-10-CM | POA: Diagnosis present

## 2018-01-15 DIAGNOSIS — T82868A Thrombosis of vascular prosthetic devices, implants and grafts, initial encounter: Secondary | ICD-10-CM | POA: Diagnosis not present

## 2018-01-15 DIAGNOSIS — J810 Acute pulmonary edema: Secondary | ICD-10-CM | POA: Diagnosis not present

## 2018-01-15 DIAGNOSIS — E875 Hyperkalemia: Secondary | ICD-10-CM | POA: Diagnosis not present

## 2018-01-15 DIAGNOSIS — G4733 Obstructive sleep apnea (adult) (pediatric): Secondary | ICD-10-CM | POA: Diagnosis present

## 2018-01-15 DIAGNOSIS — D631 Anemia in chronic kidney disease: Secondary | ICD-10-CM | POA: Diagnosis not present

## 2018-01-15 DIAGNOSIS — N186 End stage renal disease: Secondary | ICD-10-CM | POA: Diagnosis not present

## 2018-01-15 DIAGNOSIS — I252 Old myocardial infarction: Secondary | ICD-10-CM | POA: Diagnosis not present

## 2018-01-15 DIAGNOSIS — R079 Chest pain, unspecified: Secondary | ICD-10-CM | POA: Diagnosis not present

## 2018-01-15 DIAGNOSIS — E785 Hyperlipidemia, unspecified: Secondary | ICD-10-CM | POA: Diagnosis present

## 2018-01-15 DIAGNOSIS — T82898A Other specified complication of vascular prosthetic devices, implants and grafts, initial encounter: Secondary | ICD-10-CM | POA: Diagnosis not present

## 2018-01-15 DIAGNOSIS — N2581 Secondary hyperparathyroidism of renal origin: Secondary | ICD-10-CM | POA: Diagnosis present

## 2018-01-15 DIAGNOSIS — Z6841 Body Mass Index (BMI) 40.0 and over, adult: Secondary | ICD-10-CM | POA: Diagnosis not present

## 2018-01-15 DIAGNOSIS — I132 Hypertensive heart and chronic kidney disease with heart failure and with stage 5 chronic kidney disease, or end stage renal disease: Secondary | ICD-10-CM | POA: Diagnosis present

## 2018-01-15 DIAGNOSIS — M109 Gout, unspecified: Secondary | ICD-10-CM | POA: Diagnosis present

## 2018-01-15 DIAGNOSIS — E1140 Type 2 diabetes mellitus with diabetic neuropathy, unspecified: Secondary | ICD-10-CM | POA: Diagnosis present

## 2018-01-15 DIAGNOSIS — I209 Angina pectoris, unspecified: Secondary | ICD-10-CM | POA: Diagnosis not present

## 2018-01-15 DIAGNOSIS — G8918 Other acute postprocedural pain: Secondary | ICD-10-CM | POA: Diagnosis not present

## 2018-01-15 DIAGNOSIS — R0602 Shortness of breath: Secondary | ICD-10-CM | POA: Diagnosis not present

## 2018-01-15 DIAGNOSIS — I4891 Unspecified atrial fibrillation: Secondary | ICD-10-CM | POA: Diagnosis not present

## 2018-01-15 DIAGNOSIS — L989 Disorder of the skin and subcutaneous tissue, unspecified: Secondary | ICD-10-CM | POA: Diagnosis present

## 2018-01-15 DIAGNOSIS — E039 Hypothyroidism, unspecified: Secondary | ICD-10-CM | POA: Diagnosis present

## 2018-01-15 DIAGNOSIS — I2583 Coronary atherosclerosis due to lipid rich plaque: Secondary | ICD-10-CM | POA: Diagnosis not present

## 2018-01-25 DIAGNOSIS — I871 Compression of vein: Secondary | ICD-10-CM | POA: Diagnosis not present

## 2018-01-25 DIAGNOSIS — T82858A Stenosis of vascular prosthetic devices, implants and grafts, initial encounter: Secondary | ICD-10-CM | POA: Diagnosis not present

## 2018-01-25 DIAGNOSIS — T82898A Other specified complication of vascular prosthetic devices, implants and grafts, initial encounter: Secondary | ICD-10-CM | POA: Diagnosis not present

## 2018-01-25 DIAGNOSIS — N186 End stage renal disease: Secondary | ICD-10-CM | POA: Diagnosis not present

## 2018-01-25 DIAGNOSIS — D631 Anemia in chronic kidney disease: Secondary | ICD-10-CM | POA: Diagnosis not present

## 2018-01-25 DIAGNOSIS — N2581 Secondary hyperparathyroidism of renal origin: Secondary | ICD-10-CM | POA: Diagnosis not present

## 2018-01-25 DIAGNOSIS — Z992 Dependence on renal dialysis: Secondary | ICD-10-CM | POA: Diagnosis not present

## 2018-01-27 DIAGNOSIS — N186 End stage renal disease: Secondary | ICD-10-CM | POA: Diagnosis not present

## 2018-01-27 DIAGNOSIS — N2581 Secondary hyperparathyroidism of renal origin: Secondary | ICD-10-CM | POA: Diagnosis not present

## 2018-01-27 DIAGNOSIS — D631 Anemia in chronic kidney disease: Secondary | ICD-10-CM | POA: Diagnosis not present

## 2018-01-29 DIAGNOSIS — N2581 Secondary hyperparathyroidism of renal origin: Secondary | ICD-10-CM | POA: Diagnosis not present

## 2018-01-29 DIAGNOSIS — D631 Anemia in chronic kidney disease: Secondary | ICD-10-CM | POA: Diagnosis not present

## 2018-01-29 DIAGNOSIS — N186 End stage renal disease: Secondary | ICD-10-CM | POA: Diagnosis not present

## 2018-01-31 DIAGNOSIS — D631 Anemia in chronic kidney disease: Secondary | ICD-10-CM | POA: Diagnosis not present

## 2018-01-31 DIAGNOSIS — N186 End stage renal disease: Secondary | ICD-10-CM | POA: Diagnosis not present

## 2018-01-31 DIAGNOSIS — N2581 Secondary hyperparathyroidism of renal origin: Secondary | ICD-10-CM | POA: Diagnosis not present

## 2018-02-02 DIAGNOSIS — Z992 Dependence on renal dialysis: Secondary | ICD-10-CM | POA: Diagnosis not present

## 2018-02-02 DIAGNOSIS — E1129 Type 2 diabetes mellitus with other diabetic kidney complication: Secondary | ICD-10-CM | POA: Diagnosis not present

## 2018-02-02 DIAGNOSIS — N186 End stage renal disease: Secondary | ICD-10-CM | POA: Diagnosis not present

## 2018-02-02 DIAGNOSIS — N2581 Secondary hyperparathyroidism of renal origin: Secondary | ICD-10-CM | POA: Diagnosis not present

## 2018-02-02 DIAGNOSIS — D631 Anemia in chronic kidney disease: Secondary | ICD-10-CM | POA: Diagnosis not present

## 2018-02-04 DIAGNOSIS — D631 Anemia in chronic kidney disease: Secondary | ICD-10-CM | POA: Diagnosis not present

## 2018-02-04 DIAGNOSIS — N2581 Secondary hyperparathyroidism of renal origin: Secondary | ICD-10-CM | POA: Diagnosis not present

## 2018-02-04 DIAGNOSIS — N186 End stage renal disease: Secondary | ICD-10-CM | POA: Diagnosis not present

## 2018-02-04 DIAGNOSIS — E1129 Type 2 diabetes mellitus with other diabetic kidney complication: Secondary | ICD-10-CM | POA: Diagnosis not present

## 2018-02-04 DIAGNOSIS — Z992 Dependence on renal dialysis: Secondary | ICD-10-CM | POA: Diagnosis not present

## 2018-02-07 DIAGNOSIS — D631 Anemia in chronic kidney disease: Secondary | ICD-10-CM | POA: Diagnosis not present

## 2018-02-07 DIAGNOSIS — E1129 Type 2 diabetes mellitus with other diabetic kidney complication: Secondary | ICD-10-CM | POA: Diagnosis not present

## 2018-02-07 DIAGNOSIS — Z992 Dependence on renal dialysis: Secondary | ICD-10-CM | POA: Diagnosis not present

## 2018-02-07 DIAGNOSIS — N186 End stage renal disease: Secondary | ICD-10-CM | POA: Diagnosis not present

## 2018-02-07 DIAGNOSIS — N2581 Secondary hyperparathyroidism of renal origin: Secondary | ICD-10-CM | POA: Diagnosis not present

## 2018-02-09 DIAGNOSIS — Z992 Dependence on renal dialysis: Secondary | ICD-10-CM | POA: Diagnosis not present

## 2018-02-09 DIAGNOSIS — N2581 Secondary hyperparathyroidism of renal origin: Secondary | ICD-10-CM | POA: Diagnosis not present

## 2018-02-09 DIAGNOSIS — E1129 Type 2 diabetes mellitus with other diabetic kidney complication: Secondary | ICD-10-CM | POA: Diagnosis not present

## 2018-02-09 DIAGNOSIS — D631 Anemia in chronic kidney disease: Secondary | ICD-10-CM | POA: Diagnosis not present

## 2018-02-09 DIAGNOSIS — N186 End stage renal disease: Secondary | ICD-10-CM | POA: Diagnosis not present

## 2018-02-11 DIAGNOSIS — I5020 Unspecified systolic (congestive) heart failure: Secondary | ICD-10-CM | POA: Diagnosis not present

## 2018-02-11 DIAGNOSIS — M109 Gout, unspecified: Secondary | ICD-10-CM | POA: Diagnosis not present

## 2018-02-11 DIAGNOSIS — Z992 Dependence on renal dialysis: Secondary | ICD-10-CM | POA: Diagnosis not present

## 2018-02-11 DIAGNOSIS — I10 Essential (primary) hypertension: Secondary | ICD-10-CM | POA: Diagnosis not present

## 2018-02-11 DIAGNOSIS — M1711 Unilateral primary osteoarthritis, right knee: Secondary | ICD-10-CM | POA: Diagnosis not present

## 2018-02-11 DIAGNOSIS — N186 End stage renal disease: Secondary | ICD-10-CM | POA: Diagnosis not present

## 2018-02-11 DIAGNOSIS — D631 Anemia in chronic kidney disease: Secondary | ICD-10-CM | POA: Diagnosis not present

## 2018-02-11 DIAGNOSIS — I4891 Unspecified atrial fibrillation: Secondary | ICD-10-CM | POA: Diagnosis not present

## 2018-02-11 DIAGNOSIS — E1129 Type 2 diabetes mellitus with other diabetic kidney complication: Secondary | ICD-10-CM | POA: Diagnosis not present

## 2018-02-11 DIAGNOSIS — N2581 Secondary hyperparathyroidism of renal origin: Secondary | ICD-10-CM | POA: Diagnosis not present

## 2018-02-12 DIAGNOSIS — N186 End stage renal disease: Secondary | ICD-10-CM | POA: Diagnosis not present

## 2018-02-12 DIAGNOSIS — Z992 Dependence on renal dialysis: Secondary | ICD-10-CM | POA: Diagnosis not present

## 2018-02-12 DIAGNOSIS — I120 Hypertensive chronic kidney disease with stage 5 chronic kidney disease or end stage renal disease: Secondary | ICD-10-CM | POA: Diagnosis not present

## 2018-02-14 DIAGNOSIS — Z992 Dependence on renal dialysis: Secondary | ICD-10-CM | POA: Diagnosis not present

## 2018-02-14 DIAGNOSIS — N2581 Secondary hyperparathyroidism of renal origin: Secondary | ICD-10-CM | POA: Diagnosis not present

## 2018-02-14 DIAGNOSIS — D509 Iron deficiency anemia, unspecified: Secondary | ICD-10-CM | POA: Diagnosis not present

## 2018-02-14 DIAGNOSIS — N186 End stage renal disease: Secondary | ICD-10-CM | POA: Diagnosis not present

## 2018-02-14 DIAGNOSIS — D631 Anemia in chronic kidney disease: Secondary | ICD-10-CM | POA: Diagnosis not present

## 2018-02-14 DIAGNOSIS — E1129 Type 2 diabetes mellitus with other diabetic kidney complication: Secondary | ICD-10-CM | POA: Diagnosis not present

## 2018-02-17 DIAGNOSIS — D509 Iron deficiency anemia, unspecified: Secondary | ICD-10-CM | POA: Diagnosis not present

## 2018-02-17 DIAGNOSIS — Z992 Dependence on renal dialysis: Secondary | ICD-10-CM | POA: Diagnosis not present

## 2018-02-17 DIAGNOSIS — E1129 Type 2 diabetes mellitus with other diabetic kidney complication: Secondary | ICD-10-CM | POA: Diagnosis not present

## 2018-02-17 DIAGNOSIS — N186 End stage renal disease: Secondary | ICD-10-CM | POA: Diagnosis not present

## 2018-02-17 DIAGNOSIS — N2581 Secondary hyperparathyroidism of renal origin: Secondary | ICD-10-CM | POA: Diagnosis not present

## 2018-02-17 DIAGNOSIS — D631 Anemia in chronic kidney disease: Secondary | ICD-10-CM | POA: Diagnosis not present

## 2018-02-19 DIAGNOSIS — E1129 Type 2 diabetes mellitus with other diabetic kidney complication: Secondary | ICD-10-CM | POA: Diagnosis not present

## 2018-02-19 DIAGNOSIS — N2581 Secondary hyperparathyroidism of renal origin: Secondary | ICD-10-CM | POA: Diagnosis not present

## 2018-02-19 DIAGNOSIS — D631 Anemia in chronic kidney disease: Secondary | ICD-10-CM | POA: Diagnosis not present

## 2018-02-19 DIAGNOSIS — Z992 Dependence on renal dialysis: Secondary | ICD-10-CM | POA: Diagnosis not present

## 2018-02-19 DIAGNOSIS — D509 Iron deficiency anemia, unspecified: Secondary | ICD-10-CM | POA: Diagnosis not present

## 2018-02-19 DIAGNOSIS — N186 End stage renal disease: Secondary | ICD-10-CM | POA: Diagnosis not present

## 2018-02-21 DIAGNOSIS — E1129 Type 2 diabetes mellitus with other diabetic kidney complication: Secondary | ICD-10-CM | POA: Diagnosis not present

## 2018-02-21 DIAGNOSIS — Z992 Dependence on renal dialysis: Secondary | ICD-10-CM | POA: Diagnosis not present

## 2018-02-21 DIAGNOSIS — D509 Iron deficiency anemia, unspecified: Secondary | ICD-10-CM | POA: Diagnosis not present

## 2018-02-21 DIAGNOSIS — N2581 Secondary hyperparathyroidism of renal origin: Secondary | ICD-10-CM | POA: Diagnosis not present

## 2018-02-21 DIAGNOSIS — N186 End stage renal disease: Secondary | ICD-10-CM | POA: Diagnosis not present

## 2018-02-21 DIAGNOSIS — D631 Anemia in chronic kidney disease: Secondary | ICD-10-CM | POA: Diagnosis not present

## 2018-02-24 ENCOUNTER — Encounter (HOSPITAL_COMMUNITY): Payer: Self-pay | Admitting: *Deleted

## 2018-02-24 ENCOUNTER — Inpatient Hospital Stay (HOSPITAL_COMMUNITY): Payer: Medicare Other

## 2018-02-24 ENCOUNTER — Inpatient Hospital Stay (HOSPITAL_COMMUNITY)
Admission: EM | Admit: 2018-02-24 | Discharge: 2018-02-28 | DRG: 640 | Disposition: A | Discharge status: Home / Self Care | Payer: Medicare Other | Attending: Internal Medicine | Admitting: Internal Medicine

## 2018-02-24 ENCOUNTER — Emergency Department (HOSPITAL_COMMUNITY): Payer: Medicare Other

## 2018-02-24 DIAGNOSIS — I509 Heart failure, unspecified: Secondary | ICD-10-CM | POA: Diagnosis not present

## 2018-02-24 DIAGNOSIS — G4733 Obstructive sleep apnea (adult) (pediatric): Secondary | ICD-10-CM | POA: Diagnosis present

## 2018-02-24 DIAGNOSIS — Z87820 Personal history of traumatic brain injury: Secondary | ICD-10-CM

## 2018-02-24 DIAGNOSIS — G6289 Other specified polyneuropathies: Secondary | ICD-10-CM

## 2018-02-24 DIAGNOSIS — R000 Tachycardia, unspecified: Secondary | ICD-10-CM | POA: Diagnosis not present

## 2018-02-24 DIAGNOSIS — Z8673 Personal history of transient ischemic attack (TIA), and cerebral infarction without residual deficits: Secondary | ICD-10-CM

## 2018-02-24 DIAGNOSIS — N2581 Secondary hyperparathyroidism of renal origin: Secondary | ICD-10-CM | POA: Diagnosis present

## 2018-02-24 DIAGNOSIS — E871 Hypo-osmolality and hyponatremia: Secondary | ICD-10-CM | POA: Diagnosis present

## 2018-02-24 DIAGNOSIS — Z823 Family history of stroke: Secondary | ICD-10-CM

## 2018-02-24 DIAGNOSIS — R579 Shock, unspecified: Secondary | ICD-10-CM

## 2018-02-24 DIAGNOSIS — R0602 Shortness of breath: Secondary | ICD-10-CM | POA: Diagnosis not present

## 2018-02-24 DIAGNOSIS — Z9115 Patient's noncompliance with renal dialysis: Secondary | ICD-10-CM

## 2018-02-24 DIAGNOSIS — I272 Pulmonary hypertension, unspecified: Secondary | ICD-10-CM | POA: Diagnosis present

## 2018-02-24 DIAGNOSIS — Z452 Encounter for adjustment and management of vascular access device: Secondary | ICD-10-CM | POA: Diagnosis not present

## 2018-02-24 DIAGNOSIS — Z7289 Other problems related to lifestyle: Secondary | ICD-10-CM

## 2018-02-24 DIAGNOSIS — R531 Weakness: Secondary | ICD-10-CM | POA: Diagnosis not present

## 2018-02-24 DIAGNOSIS — Z6837 Body mass index (BMI) 37.0-37.9, adult: Secondary | ICD-10-CM

## 2018-02-24 DIAGNOSIS — M545 Low back pain: Secondary | ICD-10-CM | POA: Diagnosis present

## 2018-02-24 DIAGNOSIS — R400 Somnolence: Secondary | ICD-10-CM | POA: Diagnosis not present

## 2018-02-24 DIAGNOSIS — G8929 Other chronic pain: Secondary | ICD-10-CM | POA: Diagnosis present

## 2018-02-24 DIAGNOSIS — D631 Anemia in chronic kidney disease: Secondary | ICD-10-CM | POA: Diagnosis not present

## 2018-02-24 DIAGNOSIS — N186 End stage renal disease: Secondary | ICD-10-CM | POA: Diagnosis not present

## 2018-02-24 DIAGNOSIS — E872 Acidosis: Secondary | ICD-10-CM | POA: Diagnosis present

## 2018-02-24 DIAGNOSIS — Y902 Blood alcohol level of 40-59 mg/100 ml: Secondary | ICD-10-CM | POA: Diagnosis present

## 2018-02-24 DIAGNOSIS — I480 Paroxysmal atrial fibrillation: Secondary | ICD-10-CM | POA: Diagnosis present

## 2018-02-24 DIAGNOSIS — E1140 Type 2 diabetes mellitus with diabetic neuropathy, unspecified: Secondary | ICD-10-CM | POA: Diagnosis present

## 2018-02-24 DIAGNOSIS — E8889 Other specified metabolic disorders: Secondary | ICD-10-CM | POA: Diagnosis present

## 2018-02-24 DIAGNOSIS — E1122 Type 2 diabetes mellitus with diabetic chronic kidney disease: Secondary | ICD-10-CM | POA: Diagnosis present

## 2018-02-24 DIAGNOSIS — I959 Hypotension, unspecified: Secondary | ICD-10-CM

## 2018-02-24 DIAGNOSIS — R5381 Other malaise: Secondary | ICD-10-CM | POA: Diagnosis present

## 2018-02-24 DIAGNOSIS — Z992 Dependence on renal dialysis: Secondary | ICD-10-CM | POA: Diagnosis not present

## 2018-02-24 DIAGNOSIS — I252 Old myocardial infarction: Secondary | ICD-10-CM

## 2018-02-24 DIAGNOSIS — E669 Obesity, unspecified: Secondary | ICD-10-CM | POA: Diagnosis present

## 2018-02-24 DIAGNOSIS — M109 Gout, unspecified: Secondary | ICD-10-CM | POA: Diagnosis present

## 2018-02-24 DIAGNOSIS — I2510 Atherosclerotic heart disease of native coronary artery without angina pectoris: Secondary | ICD-10-CM | POA: Diagnosis present

## 2018-02-24 DIAGNOSIS — I120 Hypertensive chronic kidney disease with stage 5 chronic kidney disease or end stage renal disease: Secondary | ICD-10-CM | POA: Diagnosis not present

## 2018-02-24 DIAGNOSIS — I10 Essential (primary) hypertension: Secondary | ICD-10-CM

## 2018-02-24 DIAGNOSIS — E875 Hyperkalemia: Secondary | ICD-10-CM | POA: Diagnosis not present

## 2018-02-24 DIAGNOSIS — K219 Gastro-esophageal reflux disease without esophagitis: Secondary | ICD-10-CM | POA: Diagnosis present

## 2018-02-24 DIAGNOSIS — Z96653 Presence of artificial knee joint, bilateral: Secondary | ICD-10-CM | POA: Diagnosis present

## 2018-02-24 DIAGNOSIS — E11319 Type 2 diabetes mellitus with unspecified diabetic retinopathy without macular edema: Secondary | ICD-10-CM | POA: Diagnosis present

## 2018-02-24 DIAGNOSIS — E785 Hyperlipidemia, unspecified: Secondary | ICD-10-CM | POA: Diagnosis present

## 2018-02-24 DIAGNOSIS — R578 Other shock: Secondary | ICD-10-CM | POA: Diagnosis not present

## 2018-02-24 DIAGNOSIS — I132 Hypertensive heart and chronic kidney disease with heart failure and with stage 5 chronic kidney disease, or end stage renal disease: Secondary | ICD-10-CM | POA: Diagnosis not present

## 2018-02-24 DIAGNOSIS — Z833 Family history of diabetes mellitus: Secondary | ICD-10-CM

## 2018-02-24 LAB — CBC WITH DIFFERENTIAL/PLATELET
ABS IMMATURE GRANULOCYTES: 0.05 10*3/uL (ref 0.00–0.07)
BASOS ABS: 0 10*3/uL (ref 0.0–0.1)
Basophils Relative: 0 %
Eosinophils Absolute: 0 10*3/uL (ref 0.0–0.5)
Eosinophils Relative: 0 %
HCT: 33.6 % — ABNORMAL LOW (ref 39.0–52.0)
HEMOGLOBIN: 10 g/dL — AB (ref 13.0–17.0)
IMMATURE GRANULOCYTES: 1 %
LYMPHS PCT: 9 %
Lymphs Abs: 0.8 10*3/uL (ref 0.7–4.0)
MCH: 32.3 pg (ref 26.0–34.0)
MCHC: 29.8 g/dL — ABNORMAL LOW (ref 30.0–36.0)
MCV: 108.4 fL — ABNORMAL HIGH (ref 80.0–100.0)
MONO ABS: 0.5 10*3/uL (ref 0.1–1.0)
Monocytes Relative: 6 %
NEUTROS ABS: 7.5 10*3/uL (ref 1.7–7.7)
NEUTROS PCT: 84 %
Platelets: 240 10*3/uL (ref 150–400)
RBC: 3.1 MIL/uL — ABNORMAL LOW (ref 4.22–5.81)
RDW: 15.8 % — ABNORMAL HIGH (ref 11.5–15.5)
WBC: 8.8 10*3/uL (ref 4.0–10.5)
nRBC: 0 % (ref 0.0–0.2)

## 2018-02-24 LAB — RENAL FUNCTION PANEL
Albumin: 3.1 g/dL — ABNORMAL LOW (ref 3.5–5.0)
Anion gap: 24 — ABNORMAL HIGH (ref 5–15)
BUN: 78 mg/dL — ABNORMAL HIGH (ref 8–23)
CO2: 13 mmol/L — AB (ref 22–32)
Calcium: 9.2 mg/dL (ref 8.9–10.3)
Chloride: 94 mmol/L — ABNORMAL LOW (ref 98–111)
Creatinine, Ser: 13.7 mg/dL — ABNORMAL HIGH (ref 0.61–1.24)
GFR calc Af Amer: 4 mL/min — ABNORMAL LOW (ref >60)
GFR calc non Af Amer: 3 mL/min — ABNORMAL LOW (ref >60)
Glucose, Bld: 225 mg/dL — ABNORMAL HIGH (ref 70–99)
Phosphorus: 8.6 mg/dL — ABNORMAL HIGH (ref 2.5–4.6)
Potassium: 7.2 mmol/L (ref 3.5–5.1)
Sodium: 131 mmol/L — ABNORMAL LOW (ref 135–145)

## 2018-02-24 LAB — I-STAT CHEM 8, ED
BUN: 80 mg/dL — ABNORMAL HIGH (ref 8–23)
CHLORIDE: 100 mmol/L (ref 98–111)
Calcium, Ion: 0.97 mmol/L — ABNORMAL LOW (ref 1.15–1.40)
Creatinine, Ser: 14.8 mg/dL — ABNORMAL HIGH (ref 0.61–1.24)
GLUCOSE: 88 mg/dL (ref 70–99)
HCT: 36 % — ABNORMAL LOW (ref 39.0–52.0)
Hemoglobin: 12.2 g/dL — ABNORMAL LOW (ref 13.0–17.0)
POTASSIUM: 8.5 mmol/L — AB (ref 3.5–5.1)
Sodium: 126 mmol/L — ABNORMAL LOW (ref 135–145)
TCO2: 15 mmol/L — ABNORMAL LOW (ref 22–32)

## 2018-02-24 LAB — BASIC METABOLIC PANEL
ANION GAP: 23 — AB (ref 5–15)
Anion gap: 17 — ABNORMAL HIGH (ref 5–15)
BUN: 78 mg/dL — ABNORMAL HIGH (ref 8–23)
BUN: 54 mg/dL — ABNORMAL HIGH (ref 8–23)
CO2: 12 mmol/L — ABNORMAL LOW (ref 22–32)
CO2: 20 mmol/L — ABNORMAL LOW (ref 22–32)
CREATININE: 8.8 mg/dL — AB (ref 0.61–1.24)
Calcium: 8.1 mg/dL — ABNORMAL LOW (ref 8.9–10.3)
Calcium: 7.8 mg/dL — ABNORMAL LOW (ref 8.9–10.3)
Chloride: 94 mmol/L — ABNORMAL LOW (ref 98–111)
Chloride: 96 mmol/L — ABNORMAL LOW (ref 98–111)
Creatinine, Ser: 13.82 mg/dL — ABNORMAL HIGH (ref 0.61–1.24)
GFR calc Af Amer: 7 mL/min — ABNORMAL LOW (ref >60)
GFR calc non Af Amer: 6 mL/min — ABNORMAL LOW (ref >60)
GFR, EST AFRICAN AMERICAN: 4 mL/min — AB (ref >60)
GFR, EST NON AFRICAN AMERICAN: 3 mL/min — AB (ref >60)
GLUCOSE: 92 mg/dL (ref 70–99)
Glucose, Bld: 70 mg/dL (ref 70–99)
Potassium: >7.5 mmol/L (ref 3.5–5.1)
Potassium: 4.8 mmol/L (ref 3.5–5.1)
SODIUM: 129 mmol/L — AB (ref 135–145)
Sodium: 133 mmol/L — ABNORMAL LOW (ref 135–145)

## 2018-02-24 LAB — POCT ACTIVATED CLOTTING TIME
Activated Clotting Time: 114 seconds
Activated Clotting Time: 114 seconds
Activated Clotting Time: 120 seconds
Activated Clotting Time: 125 seconds

## 2018-02-24 LAB — GLUCOSE, CAPILLARY
GLUCOSE-CAPILLARY: 50 mg/dL — AB (ref 70–99)
Glucose-Capillary: 207 mg/dL — ABNORMAL HIGH (ref 70–99)
Glucose-Capillary: 79 mg/dL (ref 70–99)

## 2018-02-24 LAB — MAGNESIUM: Magnesium: 2.6 mg/dL — ABNORMAL HIGH (ref 1.7–2.4)

## 2018-02-24 LAB — LACTIC ACID, PLASMA
LACTIC ACID, VENOUS: 11.1 mmol/L — AB (ref 0.5–1.9)
Lactic Acid, Venous: 5 mmol/L (ref 0.5–1.9)

## 2018-02-24 LAB — PHOSPHORUS: PHOSPHORUS: 8.5 mg/dL — AB (ref 2.5–4.6)

## 2018-02-24 LAB — POTASSIUM: Potassium: 7.4 mmol/L (ref 3.5–5.1)

## 2018-02-24 LAB — CBG MONITORING, ED
Glucose-Capillary: 74 mg/dL (ref 70–99)
Glucose-Capillary: 114 mg/dL — ABNORMAL HIGH (ref 70–99)

## 2018-02-24 LAB — I-STAT TROPONIN, ED: TROPONIN I, POC: 0.03 ng/mL (ref 0.00–0.08)

## 2018-02-24 LAB — MRSA PCR SCREENING: MRSA by PCR: NEGATIVE

## 2018-02-24 LAB — ETHANOL: Alcohol, Ethyl (B): 58 mg/dL — ABNORMAL HIGH (ref <10)

## 2018-02-24 MED ORDER — HEPARIN BOLUS VIA INFUSION (CRRT)
1000.0000 [IU] | INTRAVENOUS | Status: DC | PRN
  Administered 2018-02-24 (×3): 1000 [IU] via INTRAVENOUS_CENTRAL
  Filled 2018-02-24: qty 1000

## 2018-02-24 MED ORDER — PHENYLEPHRINE HCL-NACL 10-0.9 MG/250ML-% IV SOLN
0.0000 ug/min | INTRAVENOUS | Status: DC
  Filled 2018-02-24: qty 250

## 2018-02-24 MED ORDER — PRISMASOL BGK 4/2.5 32-4-2.5 MEQ/L REPLACEMENT SOLN
Status: DC
  Administered 2018-02-24: 23:00:00 via INTRAVENOUS_CENTRAL
  Filled 2018-02-24 (×2): qty 5000

## 2018-02-24 MED ORDER — DEXTROSE 50 % IV SOLN: 50.0000 mL | Freq: Once | INTRAVENOUS | Status: AC

## 2018-02-24 MED ORDER — DEXTROSE 50 % IV SOLN
INTRAVENOUS | Status: AC
  Administered 2018-02-24: 50 mL
  Filled 2018-02-24: qty 50

## 2018-02-24 MED ORDER — HEPARIN SODIUM (PORCINE) 5000 UNIT/ML IJ SOLN
5000.0000 [IU] | Freq: Three times a day (TID) | INTRAMUSCULAR | Status: DC
  Administered 2018-02-24 – 2018-02-28 (×11): 5000 [IU] via SUBCUTANEOUS
  Filled 2018-02-24 (×11): qty 1

## 2018-02-24 MED ORDER — DEXTROSE 50 % IV SOLN
1.0000 | Freq: Once | INTRAVENOUS | Status: AC
  Administered 2018-02-24: 50 mL via INTRAVENOUS
  Filled 2018-02-24: qty 50

## 2018-02-24 MED ORDER — HEPARIN SODIUM (PORCINE) 1000 UNIT/ML DIALYSIS
1000.0000 [IU] | INTRAMUSCULAR | Status: DC | PRN
  Administered 2018-02-25: 2800 [IU] via INTRAVENOUS_CENTRAL
  Administered 2018-02-28: 3000 [IU] via INTRAVENOUS_CENTRAL
  Filled 2018-02-24: qty 3
  Filled 2018-02-24 (×2): qty 6

## 2018-02-24 MED ORDER — NOREPINEPHRINE-SODIUM CHLORIDE 4-0.9 MG/250ML-% IV SOLN
2.0000 ug/min | INTRAVENOUS | Status: DC
  Administered 2018-02-24: 2 ug/min via INTRAVENOUS
  Filled 2018-02-24: qty 250

## 2018-02-24 MED ORDER — INSULIN ASPART 100 UNIT/ML ~~LOC~~ SOLN
0.0000 [IU] | SUBCUTANEOUS | Status: DC
  Administered 2018-02-24: 3 [IU] via SUBCUTANEOUS
  Administered 2018-02-25 (×2): 2 [IU] via SUBCUTANEOUS
  Administered 2018-02-26 (×2): 1 [IU] via SUBCUTANEOUS

## 2018-02-24 MED ORDER — SODIUM BICARBONATE 8.4 % IV SOLN
50.0000 meq | Freq: Once | INTRAVENOUS | Status: AC
  Administered 2018-02-24: 50 meq via INTRAVENOUS

## 2018-02-24 MED ORDER — SODIUM BICARBONATE 8.4 % IV SOLN
INTRAVENOUS | Status: AC
  Administered 2018-02-24: 50 meq via INTRAVENOUS
  Filled 2018-02-24: qty 50

## 2018-02-24 MED ORDER — THIAMINE HCL 100 MG/ML IJ SOLN
100.0000 mg | Freq: Every day | INTRAMUSCULAR | Status: DC
  Administered 2018-02-24 – 2018-02-26 (×3): 100 mg via INTRAVENOUS
  Filled 2018-02-24 (×3): qty 2

## 2018-02-24 MED ORDER — PRISMASOL BGK 0/2.5 32-2.5 MEQ/L IV SOLN
INTRAVENOUS | Status: DC
  Administered 2018-02-24 (×3): via INTRAVENOUS_CENTRAL
  Filled 2018-02-24 (×8): qty 5000

## 2018-02-24 MED ORDER — VANCOMYCIN HCL 10 G IV SOLR
2000.0000 mg | Freq: Once | INTRAVENOUS | Status: AC
  Administered 2018-02-24: 2000 mg via INTRAVENOUS
  Filled 2018-02-24: qty 2000

## 2018-02-24 MED ORDER — SODIUM CHLORIDE 0.9 % IV SOLN
250.0000 mL | INTRAVENOUS | Status: DC
  Administered 2018-02-24: 250 mL via INTRAVENOUS

## 2018-02-24 MED ORDER — ALBUTEROL SULFATE (2.5 MG/3ML) 0.083% IN NEBU
10.0000 mg | INHALATION_SOLUTION | Freq: Once | RESPIRATORY_TRACT | Status: AC
  Administered 2018-02-24: 10 mg via RESPIRATORY_TRACT
  Filled 2018-02-24: qty 12

## 2018-02-24 MED ORDER — CALCIUM GLUCONATE-NACL 1-0.675 GM/50ML-% IV SOLN
1.0000 g | Freq: Once | INTRAVENOUS | Status: DC
  Filled 2018-02-24 (×2): qty 50

## 2018-02-24 MED ORDER — CALCIUM CHLORIDE 10 % IV SOLN
1.0000 g | Freq: Once | INTRAVENOUS | Status: AC
  Administered 2018-02-24: 1 g via INTRAVENOUS
  Filled 2018-02-24: qty 10

## 2018-02-24 MED ORDER — PRISMASOL BGK 4/2.5 32-4-2.5 MEQ/L IV SOLN
INTRAVENOUS | Status: DC
  Administered 2018-02-24 – 2018-02-25 (×4): via INTRAVENOUS_CENTRAL
  Filled 2018-02-24 (×14): qty 5000

## 2018-02-24 MED ORDER — PRISMASOL BGK 4/2.5 32-4-2.5 MEQ/L IV SOLN: INTRAVENOUS | Status: DC

## 2018-02-24 MED ORDER — SODIUM CHLORIDE 0.9 % IV BOLUS
500.0000 mL | Freq: Once | INTRAVENOUS | Status: AC
  Administered 2018-02-24: 500 mL via INTRAVENOUS

## 2018-02-24 MED ORDER — SODIUM CHLORIDE 0.9 % IV SOLN: 1.0000 mg | Freq: Every day | INTRAVENOUS | Status: DC

## 2018-02-24 MED ORDER — SODIUM BICARBONATE 8.4 % IV SOLN
50.0000 meq | Freq: Once | INTRAVENOUS | Status: AC
  Administered 2018-02-24: 50 meq via INTRAVENOUS
  Filled 2018-02-24 (×2): qty 50

## 2018-02-24 MED ORDER — INSULIN ASPART 100 UNIT/ML IV SOLN
10.0000 [IU] | Freq: Once | INTRAVENOUS | Status: AC
  Administered 2018-02-24: 10 [IU] via INTRAVENOUS

## 2018-02-24 MED ORDER — INSULIN ASPART 100 UNIT/ML IV SOLN
5.0000 [IU] | Freq: Once | INTRAVENOUS | Status: AC
  Administered 2018-02-24: 5 [IU] via INTRAVENOUS

## 2018-02-24 MED ORDER — PRISMASOL BGK 4/2.5 32-4-2.5 MEQ/L REPLACEMENT SOLN: Status: DC

## 2018-02-24 MED ORDER — PATIROMER SORBITEX CALCIUM 8.4 G PO PACK
16.8000 g | PACK | ORAL | Status: AC
  Filled 2018-02-24: qty 2

## 2018-02-24 MED ORDER — HEPARIN (PORCINE) 2000 UNITS/L FOR CRRT
INTRAVENOUS_CENTRAL | Status: DC | PRN
  Administered 2018-02-24: 16:00:00 via INTRAVENOUS_CENTRAL
  Filled 2018-02-24: qty 1000

## 2018-02-24 MED ORDER — FOLIC ACID 5 MG/ML IJ SOLN
1.0000 mg | Freq: Every day | INTRAMUSCULAR | Status: DC
  Administered 2018-02-24 – 2018-02-26 (×3): 1 mg via INTRAVENOUS
  Filled 2018-02-24 (×4): qty 0.2

## 2018-02-24 MED ORDER — STERILE WATER FOR INJECTION IV SOLN
INTRAVENOUS | Status: DC
  Administered 2018-02-24 – 2018-02-25 (×2): via INTRAVENOUS
  Filled 2018-02-24 (×3): qty 850

## 2018-02-24 MED ORDER — PRISMASOL BGK 0/2.5 32-2.5 MEQ/L REPLACEMENT SOLN
Status: DC
  Administered 2018-02-24: 15:00:00 via INTRAVENOUS_CENTRAL
  Filled 2018-02-24 (×2): qty 5000

## 2018-02-24 MED ORDER — PIPERACILLIN-TAZOBACTAM 3.375 G IVPB 30 MIN
3.3750 g | Freq: Four times a day (QID) | INTRAVENOUS | Status: DC
  Administered 2018-02-24 – 2018-02-25 (×3): 3.375 g via INTRAVENOUS
  Filled 2018-02-24 (×4): qty 50

## 2018-02-24 MED ORDER — CALCIUM GLUCONATE 10 % IV SOLN
1.0000 g | Freq: Once | INTRAVENOUS | Status: AC
  Administered 2018-02-24: 1 g via INTRAVENOUS
  Filled 2018-02-24 (×2): qty 10

## 2018-02-24 MED ORDER — VANCOMYCIN HCL IN DEXTROSE 1-5 GM/200ML-% IV SOLN: 1000.0000 mg | INTRAVENOUS | Status: DC

## 2018-02-24 MED ORDER — CALCIUM CHLORIDE 10 % IV SOLN
1.0000 g | Freq: Once | INTRAVENOUS | Status: DC
  Filled 2018-02-24: qty 10

## 2018-02-24 MED ORDER — SODIUM CHLORIDE 0.9 % IV SOLN
250.0000 [IU]/h | INTRAVENOUS | Status: DC
  Administered 2018-02-24: 250 [IU]/h via INTRAVENOUS_CENTRAL
  Administered 2018-02-25: 1850 [IU]/h via INTRAVENOUS_CENTRAL
  Administered 2018-02-25: 850 [IU]/h via INTRAVENOUS_CENTRAL
  Administered 2018-02-25: 1550 [IU]/h via INTRAVENOUS_CENTRAL
  Filled 2018-02-24 (×6): qty 2

## 2018-02-24 NOTE — Progress Notes (Signed)
CRITICAL VALUE ALERT  Critical Value:  Potassium and lactic acid  Date & Time Notied:  02/24/18 1605  Provider Notified: Mannam  Orders Received/Actions taken: trend lactic

## 2018-02-24 NOTE — H&P (Addendum)
NAME:  Daniel Whitney, MRN:  [id number], DOB:  25-Jun-1955, LOS: 0 ADMISSION DATE:  02/24/2018, CONSULTATION DATE:  1/13 REFERRING MD:  Tomi Bamberger - EM, CHIEF COMPLAINT:  malaise  Brief History   63 yo M presenting 1/13 with malaise. ESRD, receives iHD M/W/F but did not go today due to feeling unwell. On arrival to ED, patient hyperkalemic and hypotensive requiring pressors. PCCM consulted for CVC placement, admission.  History of present illness   63 yo M with extensive PMH significant for ESRD (m/w/f iHD), a fic, CHF, HTN, CVA, Subdural hematoma, pulmonary hypertension, MI who presented to the ED via EMS transport 1/13 for malaise and generalized weakness. Of note the patient did not go to scheduled dialysis today due to feeling unwell. The patient denies associated cough, fever, n/v/d, chest pain, SOB.    In the ED, the patient noted to be hyperkalemic 7.5. The patient started on bicarb gtt, given insulin/D50, calcium gluconate. PCCM consulted for admission, consult for iHD catheter placement.   Past Medical History   ESRD CVA MI CHF HTN Pulmonary Hypertension OSA HLD Subdural Hematoma   Significant Hospital Events   1/13> admitted  Consults:  Nephrology PCCM  Procedures:  CVC (iHD catheter placement 1/13>> (pending)   Significant Diagnostic Tests:  CXR 1/13>  No obvious infiltrates appreciated   Micro Data:  none  Antimicrobials:  none  Interim history/subjective:  Presented to ED for malaise. Found to be hyperkalemic following missed iHD.  Consented for dialysis catheter placement   Objective   Blood pressure (!) 81/49, pulse 87, temperature (!) 97.4 F (36.3 C), temperature source Oral, resp. rate (!) 22, height 5\' 9"  (1.753 m), weight 114 kg, SpO2 93 %.       No intake or output data in the 24 hours ending 02/24/18 1247 Filed Weights   02/24/18 1045  Weight: 114 kg    Examination: General: obese adult male in NAD HENT: normocephalic atraumatic. Anicteric  sclera. PERRL Lungs: Diminished lung sounds bilaterally  Cardiovascular: tachycardic, no r/g/m 1+ radial pulses. BUE BLE capillary refill < 3 seconds Abdomen: obese round non-tender. Bowel sounds x4 Extremities: symmetrical bulk and tone. No obvious joint deformity.  Neuro: Awake, lethargic, oriented x3. Follows commands Skin: warm, dry, clean  Resolved Hospital Problem list     Assessment & Plan:   ESRD with hyperkalemia, with ECG changes -nephrology consulted -received bicarb push, calcium gluconate, insulin/D50 for hyperkalemia  P Admit to ICU Dialysis catheter placement for initiation of CRRT Check coags  Continue bicarb gtt Renal lab panel at 1600 ECG 1600  Shock requiring vasoactive medication -etiology unknown at this time- sepsis vs cardiogenic  Vs hypovolemic vs medication related -taking more elavil than prescribed which can cause hypotension P BCx Judicious IVF given ESRD, CHF Pharm consult RE elavil  Support with neosynephrine for MAPs > 65  Atrial Fibrillation  -no home anticoagulation P ECG 1600  Anemia, stable P Trend H/H Patient is [place] witness- NO BLOOD ADMIN  CHF -03/2017 EF 50-55% -CXR 1/13 no signs of fluid overload at this time P Judicious IVF, especially in setting of ESRD  HTN P Hold home coreg in setting of shock requiring pressors   HLD P Hold Zocor at this time  Diabetes Mellitus with diabetic neuropathy P SSI  Hold home gabapentin and elavil at this time  Best practice:  Diet: NPO  Pain/Anxiety/Delirium protocol (if indicated): none VAP protocol (if indicated): n/a DVT prophylaxis: heparin GI prophylaxis:  Glucose control: SSI Mobility: bedrest Code  Status: Full (of note NO BLOOD PRODUCT ADMINISTRATION)  Family Communication: Brother at bedside  Disposition: Admit to ICU   Labs   CBC: Recent Labs  Lab 02/24/18 1122 02/24/18 1156  WBC 8.8  --   NEUTROABS 7.5  --   HGB 10.0* 12.2*  HCT 33.6* 36.0*  MCV  108.4*  --   PLT 240  --     Basic Metabolic Panel: Recent Labs  Lab 02/24/18 1122 02/24/18 1156  NA 129* 126*  K >7.5* 8.5*  CL 94* 100  CO2 12*  --   GLUCOSE 92 88  BUN 78* 80*  CREATININE 13.82* 14.80*  CALCIUM 8.1*  --    GFR: Estimated Creatinine Clearance: 6.4 mL/min (A) (by C-G formula based on SCr of 14.8 mg/dL (H)). Recent Labs  Lab 02/24/18 1122  WBC 8.8    Liver Function Tests: No results for input(s): AST, ALT, ALKPHOS, BILITOT, PROT, ALBUMIN in the last 168 hours. No results for input(s): LIPASE, AMYLASE in the last 168 hours. No results for input(s): AMMONIA in the last 168 hours.  ABG    Component Value Date/Time   PHART 7.266 (L) 01/03/2015 1958   PCO2ART 47.8 (H) 01/03/2015 1958   PO2ART 338.0 (H) 01/03/2015 1958   HCO3 21.7 01/03/2015 1958   TCO2 15 (L) 02/24/2018 1156   ACIDBASEDEF 5.0 (H) 01/03/2015 1958   O2SAT 100.0 01/03/2015 1958     Coagulation Profile: No results for input(s): INR, PROTIME in the last 168 hours.  Cardiac Enzymes: No results for input(s): CKTOTAL, CKMB, CKMBINDEX, TROPONINI in the last 168 hours.  HbA1C: Hgb A1c MFr Bld  Date/Time Value Ref Range Status  06/19/2016 10:03 AM 6.2 (H) 4.8 - 5.6 % Final    Comment:    (NOTE)         Pre-diabetes: 5.7 - 6.4         Diabetes: >6.4         Glycemic control for adults with diabetes: <7.0   01/17/2016 11:15 AM 6.7 (H) 4.8 - 5.6 % Final    Comment:    (NOTE)         Pre-diabetes: 5.7 - 6.4         Diabetes: >6.4         Glycemic control for adults with diabetes: <7.0     CBG: Recent Labs  Lab 02/24/18 1114  GLUCAP 74    Review of Systems:   As per HPI   Past Medical History  He,  has a past medical history of Allergic rhinitis (07/07/2014), Anemia due to other cause (07/07/2014), Arthritis, Atrial fibrillation ([place]) (12/30/2012), CHF (congestive heart failure) ([place]) (12/30/2012), Chronic lower back pain, Diabetes mellitus with neuropathy ([place]), Diabetic  retinopathy ([place]) (12/30/2012), Dysrhythmia, ESRD (end stage renal disease) on dialysis ([place]) (started in 2013), GERD (gastroesophageal reflux disease), Glaucoma, History of gout, Hyperlipidemia, Hypertension, Hypothyroid, Morbid obesity ([place]) (12/30/2012), Myocardial infarction ([place]), OSA (obstructive sleep apnea) (12/30/2012), Pulmonary hypertension ([place]) (12/30/2012), Refusal of blood transfusions as patient is Jehovah's Witness, Stroke ([place]) (~ 2005; ~ 2005), and Subdural hematoma (Oct 2018).   Surgical History    Past Surgical History:  Procedure Laterality Date  . AV FISTULA PLACEMENT Left ~ 10/2013   "forearm"  . CARDIAC CATHETERIZATION N/A 01/07/2015   Procedure: Left Heart Cath and Coronary Angiography;  Surgeon: Troy Sine, MD;  Location: [facility];  Service: Cardiovascular;  Laterality: N/A;  . CLOSED REDUCTION HIP DISLOCATION Right 1970's  . JOINT  REPLACEMENT    . TOTAL HIP ARTHROPLASTY Right 1980  . TOTAL KNEE ARTHROPLASTY Right 01/24/2016   Procedure: TOTAL KNEE ARTHROPLASTY;  Surgeon: Dorna Leitz, MD;  Location: [facility];  Service: Orthopedics;  Laterality: Right;  . TOTAL KNEE ARTHROPLASTY Left 06/26/2016   Procedure: TOTAL KNEE ARTHROPLASTY;  Surgeon: Dorna Leitz, MD;  Location: [facility];  Service: Orthopedics;  Laterality: Left;     Social History   reports that he has never smoked. He has never used smokeless tobacco. He reports current alcohol use of about 7.0 - 8.0 standard drinks of alcohol per week. He reports that he does not use drugs.   Family History   His family history includes Arthritis in an other family member; Cancer in his father; Diabetes in his brother, mother, sister, and another family member; Heart disease in his father and another family member; Hyperlipidemia in an other family member; Hypertension in an other family member; Kidney disease in an other family member; Stroke in his brother and another family member.   Allergies Allergies    Allergen Reactions  . Tobacco [Nicotiana Tabacum] Shortness Of Breath     Home Medications  Prior to Admission medications   Medication Sig Start Date End Date Taking? Authorizing Provider  acetaminophen (TYLENOL) 650 MG CR tablet Take 650 mg by mouth every 8 (eight) hours as needed for pain.    [provider]  allopurinol (ZYLOPRIM) 100 MG tablet Take 1 tablet (100 mg total) by mouth 2 (two) times daily. 07/31/16   Medina-Vargas, Monina C, NP  amitriptyline (ELAVIL) 50 MG tablet Take 1 tablet (50 mg total) by mouth at bedtime. Patient taking differently: Take 75 mg by mouth at bedtime.  07/31/16   Medina-Vargas, Monina C, NP  b complex-vitamin c-folic acid (NEPHRO-VITE) 0.8 MG TABS tablet Take 1 tablet by mouth daily.    [provider]  carvedilol (COREG) 25 MG tablet Take 1 tablet (25 mg total) by mouth See admin instructions. Give 12.5 tablet BID on M-W-fri, give 1 tablet qd on Tue-Thu-Sun-Sat Patient taking differently: Take 12.5-25 mg by mouth See admin instructions. Give 12.5mg  twice daily MW and 25mg  daily on Tue-Thu-Sun-Sat 12/01/16   Geradine Girt, DO  cinacalcet (SENSIPAR) 30 MG tablet Take 30 mg by mouth daily with supper. 12/23/17   [provider]  Cyanocobalamin (VITAMIN B-12) 5000 MCG SUBL Place 1 tablet under the tongue once a week.    [provider]  Dextromethorphan-Guaifenesin (MUCINEX DM) 30-600 MG TB12 Take 1 tablet by mouth daily as needed (congestion).     [provider]  docusate sodium (COLACE) 100 MG capsule Take 100 mg by mouth daily as needed for mild constipation.     [provider]  ferric citrate (AURYXIA) 1 GM 210 MG(Fe) tablet Take 1 tablet (210 mg total) by mouth 3 (three) times daily with meals. 07/31/16   Medina-Vargas, Monina C, NP  gabapentin (NEURONTIN) 100 MG capsule Take 2 capsules (200 mg total) by mouth at bedtime. 200 mg qam and 300 mg upon return from dialysis on M-W-F for neuropathy Patient  taking differently: Take 100-200 mg by mouth See admin instructions. 100mg  in the morning and 200mg  in the evening 07/31/16   Medina-Vargas, Monina C, NP  Pramoxine-Menthol-Dimethicone (GOLD BOND MEDICATED ANTI ITCH EX) Apply 1 application topically daily as needed. Apply to skin twice a day for itching.     [provider]  simvastatin (ZOCOR) 20 MG tablet Take 1 tablet (20 mg total) by  mouth daily. Patient taking differently: Take 20 mg by mouth at bedtime.  07/31/16   Medina-Vargas, Monina C, NP  sodium chloride (OCEAN) 0.65 % SOLN nasal spray Place 1 spray into both nostrils as needed for congestion. 01/31/14   GhimireHenreitta Leber, MD     Critical care time: 50 min     Eliseo Gum MSN, AGACNP-BC [place] 02/24/2018, 1:19 PM

## 2018-02-24 NOTE — ED Triage Notes (Signed)
Pt is from home where he has some generalized weakness and was not able to fully get into his wheelchair and fell to ground.  No injuries from fall, as helped into wheelchair by FD.  Pt is alert and oriented.  Pt was supposed to have HD this am but missed due to feeling weak and tired this am and fall.  BP was 86palpated for ems, pt has hx of same.  Pt had 211ml NS pta for ems. Wide complex rhythm for ems, no CP but some sob. No CP, ems gave calcium 1g and 39meq of sodium bicarb due to rhythm.

## 2018-02-24 NOTE — Consult Note (Addendum)
[facility] Nephrology consult note  Reason for Consult: To manage dialysis and dialysis related needs Referring Physician: Dr. Tomi Bamberger (ER)  HPI:  Daniel Whitney is an 63 y.o. male history of diabetes, hypertension, stroke, hyperlipidemia, obesity, ESRD on dialysis at [facility] MWF, presented with generalized weakness, fatigue and some shortness of breath.  His last treatment was on Friday when he completed 4 hours 15 minutes of treatment however he left 2.7 kg above his dry weight.  In the ER, he was found to have serum potassium level of 8.5, sodium 126, hemoglobin 12.2, chest x-ray showed no pulmonary edema.  His blood pressure was systolic 94R therefore started on Levophed IV. For hyperkalemia, patient received sodium bicarbonate, albuterol, insulin, dextrose, calcium gluconate.  He is being admitted to ICU for shock and severe hyperkalemia.  Patient denied chest pain, headache, dizziness, fever, chills, nausea, vomiting or abdominal pain.  Dialyzes at [facility], 4 hours 15 minutes, 450/800  EDW 113. HD Bath 2 k, 3.5 ca, Dialyzer 180 optiflux, Heparin 3000 units bolus. Access AVF. Last HD on 02/21/18, full treatment, left 2.7 kg above DW. mircera 60 q2weekly last dose on 02/09/18.  Past Medical History:  Diagnosis Date  . Allergic rhinitis 07/07/2014  . Anemia due to other cause 07/07/2014  . Arthritis    "all over"  . Atrial fibrillation ([place]) 12/30/2012  . CHF (congestive heart failure) ([place]) 12/30/2012  . Chronic lower back pain   . Diabetes mellitus with neuropathy ([place])   . Diabetic retinopathy ([place]) 12/30/2012   no medications  . Dysrhythmia    Afib  . ESRD (end stage renal disease) on dialysis ([place]) started in 2013   MWF; [facility]; [employer]  . GERD (gastroesophageal reflux disease)   . Glaucoma   . History of gout    "right big toe"  . Hyperlipidemia   . Hypertension   . Hypothyroid    01/17/16- no longer on meduication  . Morbid obesity ([place])  12/30/2012  . Myocardial infarction ([place])    "I've had ~ 3; last one was in ~ 10/2013" (01/30/2014)  . OSA (obstructive sleep apnea) 12/30/2012   "lost weight; no longer needed CPAP; retested said I needed it; didn't followup cause I was feeling fine" (01/30/2014)  . Pulmonary hypertension ([place]) 12/30/2012  . Refusal of blood transfusions as patient is Jehovah's Witness   . Stroke [facility]) ~ 2005; ~ 2005   "they were mild; I didn't even notice I'd had them"; denies residual on 01/30/2014  . Subdural hematoma (Oct 2018)    Coumadin and heparin placed on hold at dc- pt did not FU w/ NS as recommended after dc so not on warfarin when admitted 04/03/17    Past Surgical History:  Procedure Laterality Date  . AV FISTULA PLACEMENT Left ~ 10/2013   "forearm"  . CARDIAC CATHETERIZATION N/A 01/07/2015   Procedure: Left Heart Cath and Coronary Angiography;  Surgeon: Troy Sine, MD;  Location: [place];  Service: Cardiovascular;  Laterality: N/A;  . CLOSED REDUCTION HIP DISLOCATION Right 1970's  . JOINT REPLACEMENT    . TOTAL HIP ARTHROPLASTY Right 1980  . TOTAL KNEE ARTHROPLASTY Right 01/24/2016   Procedure: TOTAL KNEE ARTHROPLASTY;  Surgeon: Dorna Leitz, MD;  Location: [facility];  Service: Orthopedics;  Laterality: Right;  . TOTAL KNEE ARTHROPLASTY Left 06/26/2016   Procedure: TOTAL KNEE ARTHROPLASTY;  Surgeon: Dorna Leitz, MD;  Location: [facility];  Service: Orthopedics;  Laterality: Left;    Family History  Problem Relation Age of Onset  . Arthritis Other   . Heart disease Other   . Hyperlipidemia Other   . Hypertension Other   . Kidney disease Other   . Diabetes Other   . Stroke Other   . Diabetes Mother   . Cancer Father        bone cancer  . Heart disease Father   . Diabetes Sister   . Diabetes Brother   . Stroke Brother     Social History:  reports that he has never smoked. He has never used smokeless tobacco. He reports current alcohol use of about 7.0 - 8.0 standard drinks  of alcohol per week. He reports that he does not use drugs.  Allergies:  Allergies  Allergen Reactions  . Tobacco [Nicotiana Tabacum] Shortness Of Breath    Medications: I have reviewed the patient's current medications.   Results for orders placed or performed during the hospital encounter of 02/24/18 (from the past 48 hour(s))  CBG monitoring, ED     Status: None   Collection Time: 02/24/18 11:14 AM  Result Value Ref Range   Glucose-Capillary 74 70 - 99 mg/dL  Basic metabolic panel     Status: Abnormal   Collection Time: 02/24/18 11:22 AM  Result Value Ref Range   Sodium 129 (L) 135 - 145 mmol/L   Potassium >7.5 (HH) 3.5 - 5.1 mmol/L    Comment: NO VISIBLE HEMOLYSIS CRITICAL RESULT CALLED TO, READ BACK BY AND VERIFIED WITH: C.BAIN,RN 1210 02/24/18 CLARK,S    Chloride 94 (L) 98 - 111 mmol/L   CO2 12 (L) 22 - 32 mmol/L   Glucose, Bld 92 70 - 99 mg/dL   BUN 78 (H) 8 - 23 mg/dL   Creatinine, Ser 13.82 (H) 0.61 - 1.24 mg/dL   Calcium 8.1 (L) 8.9 - 10.3 mg/dL   GFR calc non Af Amer 3 (L) >60 mL/min   GFR calc Af Amer 4 (L) >60 mL/min   Anion gap 23 (H) 5 - 15    Comment: Performed at [facility] [address], [place], [place] [address]  I-stat troponin, ED  (not at [facility], [facility])     Status: None   Collection Time: 02/24/18 11:37 AM  Result Value Ref Range   Troponin i, poc 0.03 0.00 - 0.08 ng/mL   Comment 3            Comment: Due to the release kinetics of cTnI, a negative result within the first hours of the onset of symptoms does not rule out myocardial infarction with certainty. If myocardial infarction is still suspected, repeat the test at appropriate intervals.   I-stat chem 8, ED  (not at [facility], [facility])     Status: Abnormal   Collection Time: 02/24/18 11:56 AM  Result Value Ref Range   Sodium 126 (L) 135 - 145 mmol/L   Potassium 8.5 (HH) 3.5 - 5.1 mmol/L   Chloride 100 98 - 111 mmol/L   BUN 80 (H) 8 - 23 mg/dL   Creatinine, Ser 14.80 (H) 0.61 - 1.24 mg/dL    Glucose, Bld 88 70 - 99 mg/dL   Calcium, Ion 0.97 (L) 1.15 - 1.40 mmol/L   TCO2 15 (L) 22 - 32 mmol/L   Hemoglobin 12.2 (L) 13.0 - 17.0 g/dL   HCT 36.0 (L) 39.0 - 52.0 %   Comment NOTIFIED PHYSICIAN     Dg Chest Port 1 View  Result Date: 02/24/2018 CLINICAL DATA:  Weakness. EXAM: PORTABLE  CHEST 1 VIEW COMPARISON:  01/01/2018 and 04/03/2017 FINDINGS: Heart size and mediastinal contours are within normal limits considering the AP portable technique. Pulmonary vascularity is normal. No infiltrates or effusions. No acute bone abnormality. IMPRESSION: No active disease. Electronically Signed   By: Lorriane Shire M.D.   On: 02/24/2018 11:49    ROS: as HPI, other systems reviewed and are negative. Blood pressure (!) 82/52, pulse 84, temperature (!) 97.4 F (36.3 C), temperature source Oral, resp. rate 20, height 5\' 9"  (1.753 m), weight 114 kg, SpO2 100 %. General: Lying in bed, comfortable HEENT: PERRLA Respiratory: Bilateral coarse breath sound, no wheezing Cardiovascular: Regular rate rhythm, no rubs GI: Abdomen soft, nontender, bowel sounds positive. Neuro: Alert, awake and following commands Extremities: No edema. AV fistula with good thrill and bruit.  Assessment/Plan: # severe hyperkalemia: Potassium level 8.5.  Exact etiology of hyperkalemia unknown since he had a complete dialysis treatment on Friday.  He already received dextrose, insulin, bicarbonate, albuterol and calcium gluconate.  Ordered a dose of patiromer.  Plan for urgent CRRT. I have discussed this with the patient, ER physician and Dr Vaughan Browner from [facility].  Monitor labs.  # Shock: Initial systolic blood pressure in 70s, improving with Levophed.  He is being admitted to ICU.  Further management per critical care team.  # ESRD: MWF, plan for CRRT today with 0K bath, IV heparin for anticoagulation, no net fluid removal because of shock. If BP improves we may be able to do net ultrafiltration.   # Anemia of ESRD: Hemoglobin  12.2.  Last dose of Mircera on 02/09/2018.  # Metabolic Bone Disease: Monitor calcium and phosphorus.  #Hypervolemic hyponatremia: Starting dialysis.    Tanna Furry 02/24/2018, 12:15 PM

## 2018-02-24 NOTE — ED Provider Notes (Signed)
[facility] EMERGENCY DEPARTMENT Provider Note   CSN: [id number] Arrival date & time: 02/24/18  1040     History   Chief Complaint Chief Complaint  Patient presents with  . Fall    HPI Daniel Whitney is a 63 y.o. male.  HPI Pt had some general malaise since around 2 am.  He got up to have a BM and noticed that he was not feeling well.  Pt feels weak all over and fatigued.  He is having trouble transferring in and out of his wheelchair.  No fevers.  No cough.  No vomiting or diarrhea.  No blood in the stool.   No pain.    Pt went do dialysis on Friday but did not go this morning.  Pt came in by EMS.He was given calcium and sodium. Past Medical History:  Diagnosis Date  . Allergic rhinitis 07/07/2014  . Anemia due to other cause 07/07/2014  . Arthritis    "all over"  . Atrial fibrillation ([place]) 12/30/2012  . CHF (congestive heart failure) ([place]) 12/30/2012  . Chronic lower back pain   . Diabetes mellitus with neuropathy ([place])   . Diabetic retinopathy ([place]) 12/30/2012   no medications  . Dysrhythmia    Afib  . ESRD (end stage renal disease) on dialysis ([place]) started in 2013   MWF; [facility]; [employer]  . GERD (gastroesophageal reflux disease)   . Glaucoma   . History of gout    "right big toe"  . Hyperlipidemia   . Hypertension   . Hypothyroid    01/17/16- no longer on meduication  . Morbid obesity ([place]) 12/30/2012  . Myocardial infarction ([place])    "I've had ~ 3; last one was in ~ 10/2013" (01/30/2014)  . OSA (obstructive sleep apnea) 12/30/2012   "lost weight; no longer needed CPAP; retested said I needed it; didn't followup cause I was feeling fine" (01/30/2014)  . Pulmonary hypertension ([place]) 12/30/2012  . Refusal of blood transfusions as patient is Jehovah's Witness   . Stroke [facility]) ~ 2005; ~ 2005   "they were mild; I didn't even notice I'd had them"; denies residual on 01/30/2014  . Subdural hematoma (Oct 2018)    Coumadin and heparin placed  on hold at dc- pt did not FU w/ NS as recommended after dc so not on warfarin when admitted 04/03/17    Patient Active Problem List   Diagnosis Date Noted  . Hyperkalemia, diminished renal excretion 01/01/2018  . Syncope and collapse 04/03/2017  . History of CVA (cerebrovascular accident) 04/03/2017  . CKD (chronic kidney disease) stage V requiring chronic dialysis ([place]) 04/03/2017  . Hypothyroid 04/03/2017  . Refusal of blood transfusions as patient is Jehovah's Witness 04/03/2017  . Prediabetes-HgbA1c 6.2 04/03/2017  . History of subdural hematoma Oct 2018 04/03/2017  . 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 Chronic atrial fibrillation 04/03/2017  . Subdural hematoma ([place]) 11/29/2016  . Peripheral neuropathy 07/26/2016  . At risk for adverse drug reaction 07/03/2016  . Primary osteoarthritis of left knee 06/26/2016  . Postoperative anemia due to acute blood loss 01/27/2016  . S/P knee replacement 01/24/2016  . Low back pain 02/09/2015  . Bilateral knee pain 01/27/2015  . Atypical atrial flutter ([place]) 01/24/2015  . Acute on chronic combined systolic and diastolic heart failure ([place]) 01/05/2015  . Hyperkalemia 11/08/2014  . Hypocalcemia 11/08/2014  . Recurrent falls 10/13/2014  . Venous stasis ulcer ([place]) 10/13/2014  . Hypoxia   .  Leukocytosis   . Respiratory failure ([place])   . ARDS (adult respiratory distress syndrome) ([place])   . Encounter for central line placement   . Cardiopulmonary arrest ([place])   . Compression fracture of L3 lumbar vertebra 08/04/2014  . Anemia of chronic renal failure, unspecified stage 07/07/2014  . Allergic rhinitis 07/07/2014  . Anemia of chronic disease 07/07/2014  . Right rotator cuff tear 04/27/2014  . Acute gouty arthritis 11/11/2013  . Hearing loss in right ear 11/11/2013  . Chronic pain syndrome 11/11/2013  . Right shoulder pain 11/11/2013  . Facial rash 11/01/2013  . Right otitis externa 10/27/2013  .  Pulmonary HTN- PA 58 mmHg 04/16/13 09/17/2013  . Right hip pain 08/04/2013  . DOE (dyspnea on exertion) 06/18/2013  . Allergic rhinitis, cause unspecified 06/18/2013  . Carpal tunnel syndrome 06/06/2013  . Primary localized osteoarthrosis, lower leg 06/04/2013  . Preoperative cardiovascular examination 03/10/2013  . Preventative health care 12/30/2012  . AF (paroxysmal atrial fibrillation) ([place]) 12/30/2012  . CHF (congestive heart failure) ([place]) 12/30/2012  . Diabetic retinopathy ([place]) 12/30/2012  . Morbid obesity ([place]) 12/30/2012  . OSA (obstructive sleep apnea)- on C-pap 12/30/2012  . Bilateral hearing loss 12/30/2012  . Arthritis   . Glaucoma   . Diabetes mellitus with neuropathy ([place])   . Hypertension   . ESRD (end stage renal disease) on dialysis ([place])   . Stroke ([place])   . Hyperlipidemia     Past Surgical History:  Procedure Laterality Date  . AV FISTULA PLACEMENT Left ~ 10/2013   "forearm"  . CARDIAC CATHETERIZATION N/A 01/07/2015   Procedure: Left Heart Cath and Coronary Angiography;  Surgeon: Troy Sine, MD;  Location: [facility];  Service: Cardiovascular;  Laterality: N/A;  . CLOSED REDUCTION HIP DISLOCATION Right 1970's  . JOINT REPLACEMENT    . TOTAL HIP ARTHROPLASTY Right 1980  . TOTAL KNEE ARTHROPLASTY Right 01/24/2016   Procedure: TOTAL KNEE ARTHROPLASTY;  Surgeon: Dorna Leitz, MD;  Location: [facility];  Service: Orthopedics;  Laterality: Right;  . TOTAL KNEE ARTHROPLASTY Left 06/26/2016   Procedure: TOTAL KNEE ARTHROPLASTY;  Surgeon: Dorna Leitz, MD;  Location: [facility];  Service: Orthopedics;  Laterality: Left;        Home Medications    Prior to Admission medications   Medication Sig Start Date End Date Taking? Authorizing Provider  acetaminophen (TYLENOL) 650 MG CR tablet Take 650 mg by mouth every 8 (eight) hours as needed for pain.    [provider]  allopurinol (ZYLOPRIM) 100 MG tablet Take 1 tablet (100 mg total) by mouth 2 (two) times  daily. 07/31/16   Medina-Vargas, Monina C, NP  amitriptyline (ELAVIL) 50 MG tablet Take 1 tablet (50 mg total) by mouth at bedtime. Patient taking differently: Take 75 mg by mouth at bedtime.  07/31/16   Medina-Vargas, Monina C, NP  b complex-vitamin c-folic acid (NEPHRO-VITE) 0.8 MG TABS tablet Take 1 tablet by mouth daily.    [provider]  carvedilol (COREG) 25 MG tablet Take 1 tablet (25 mg total) by mouth See admin instructions. Give 12.5 tablet BID on M-W-fri, give 1 tablet qd on Tue-Thu-Sun-Sat Patient taking differently: Take 12.5-25 mg by mouth See admin instructions. Give 12.5mg  twice daily MW and 25mg  daily on Tue-Thu-Sun-Sat 12/01/16   Geradine Girt, DO  cinacalcet (SENSIPAR) 30 MG tablet Take 30 mg by mouth daily with supper. 12/23/17   [provider]  Cyanocobalamin (VITAMIN B-12) 5000 MCG SUBL Place 1 tablet under the  tongue once a week.    [provider]  Dextromethorphan-Guaifenesin (MUCINEX DM) 30-600 MG TB12 Take 1 tablet by mouth daily as needed (congestion).     [provider]  docusate sodium (COLACE) 100 MG capsule Take 100 mg by mouth daily as needed for mild constipation.     [provider]  ferric citrate (AURYXIA) 1 GM 210 MG(Fe) tablet Take 1 tablet (210 mg total) by mouth 3 (three) times daily with meals. 07/31/16   Medina-Vargas, Monina C, NP  gabapentin (NEURONTIN) 100 MG capsule Take 2 capsules (200 mg total) by mouth at bedtime. 200 mg qam and 300 mg upon return from dialysis on M-W-F for neuropathy Patient taking differently: Take 100-200 mg by mouth See admin instructions. 100mg  in the morning and 200mg  in the evening 07/31/16   Medina-Vargas, Monina C, NP  Pramoxine-Menthol-Dimethicone (GOLD BOND MEDICATED ANTI ITCH EX) Apply 1 application topically daily as needed. Apply to skin twice a day for itching.     [provider]  simvastatin (ZOCOR) 20 MG tablet Take 1 tablet (20 mg total) by mouth daily. Patient  taking differently: Take 20 mg by mouth at bedtime.  07/31/16   Medina-Vargas, Monina C, NP  sodium chloride (OCEAN) 0.65 % SOLN nasal spray Place 1 spray into both nostrils as needed for congestion. 01/31/14   Jonetta Osgood, MD    Family History Family History  Problem Relation Age of Onset  . Arthritis Other   . Heart disease Other   . Hyperlipidemia Other   . Hypertension Other   . Kidney disease Other   . Diabetes Other   . Stroke Other   . Diabetes Mother   . Cancer Father        bone cancer  . Heart disease Father   . Diabetes Sister   . Diabetes Brother   . Stroke Brother     Social History Social History   Tobacco Use  . Smoking status: Never Smoker  . Smokeless tobacco: Never Used  Substance Use Topics  . Alcohol use: Yes    Alcohol/week: 7.0 - 8.0 standard drinks    Types: 1 Cans of beer, 3 Shots of liquor, 3 - 4 Standard drinks or equivalent per week    Comment: 07/03/16 1 pint 2-3X/week  . Drug use: No     Allergies   Tobacco [nicotiana tabacum]   Review of Systems Review of Systems  All other systems reviewed and are negative.    Physical Exam Updated Vital Signs BP (!) 82/52   Pulse 84   Temp (!) 97.4 F (36.3 C) (Oral)   Resp 20   Ht 1.753 m (5\' 9" )   Wt 114 kg   SpO2 100%   BMI 37.11 kg/m   Physical Exam Vitals signs and nursing note reviewed.  Constitutional:      General: He is not in acute distress.    Appearance: He is well-developed. He is ill-appearing.  HENT:     Head: Normocephalic and atraumatic.     Right Ear: External ear normal.     Left Ear: External ear normal.  Eyes:     General: No scleral icterus.       Right eye: No discharge.        Left eye: No discharge.     Conjunctiva/sclera: Conjunctivae normal.  Neck:     Musculoskeletal: Neck supple.     Trachea: No tracheal deviation.  Cardiovascular:     Rate and Rhythm:  Normal rate and regular rhythm.  Pulmonary:     Effort: Pulmonary effort is normal. No  respiratory distress.     Breath sounds: Normal breath sounds. No stridor. No wheezing or rales.  Abdominal:     General: Bowel sounds are normal. There is no distension.     Palpations: Abdomen is soft.     Tenderness: There is no abdominal tenderness. There is no guarding or rebound.  Musculoskeletal:        General: No tenderness.  Skin:    General: Skin is warm and dry.     Findings: No rash.  Neurological:     Mental Status: He is alert.     Cranial Nerves: No cranial nerve deficit (no facial droop, extraocular movements intact, no slurred speech).     Sensory: No sensory deficit.     Motor: No abnormal muscle tone or seizure activity.     Coordination: Coordination normal.     Comments: Generalized weakness      ED Treatments / Results  Labs (all labs ordered are listed, but only abnormal results are displayed) Labs Reviewed  BASIC METABOLIC PANEL - Abnormal; Notable for the following components:      Result Value   Sodium 129 (*)    Potassium >7.5 (*)    Chloride 94 (*)    CO2 12 (*)    BUN 78 (*)    Creatinine, Ser 13.82 (*)    Calcium 8.1 (*)    GFR calc non Af Amer 3 (*)    GFR calc Af Amer 4 (*)    Anion gap 23 (*)    All other components within normal limits  I-STAT CHEM 8, ED - Abnormal; Notable for the following components:   Sodium 126 (*)    Potassium 8.5 (*)    BUN 80 (*)    Creatinine, Ser 14.80 (*)    Calcium, Ion 0.97 (*)    TCO2 15 (*)    Hemoglobin 12.2 (*)    HCT 36.0 (*)    All other components within normal limits  CBC WITH DIFFERENTIAL/PLATELET  RENAL FUNCTION PANEL  CBG MONITORING, ED  I-STAT TROPONIN, ED    EKG EKG Interpretation  Date/Time:  Monday February 24 2018 10:46:26 EST Ventricular Rate:  83 PR Interval:    QRS Duration: 284 QT Interval:  554 QTC Calculation: 652 R Axis:   -135 Text Interpretation:  SINUS RHYTHM Right bundle branch block QRS WIDENING SINCE LAST TRACING Confirmed by Dorie Rank [phone]) on 02/24/2018  10:50:51 AM   Radiology Dg Chest Port 1 View  Result Date: 02/24/2018 CLINICAL DATA:  Weakness. EXAM: PORTABLE CHEST 1 VIEW COMPARISON:  01/01/2018 and 04/03/2017 FINDINGS: Heart size and mediastinal contours are within normal limits considering the AP portable technique. Pulmonary vascularity is normal. No infiltrates or effusions. No acute bone abnormality. IMPRESSION: No active disease. Electronically Signed   By: Lorriane Shire M.D.   On: 02/24/2018 11:49    Procedures .Critical Care Performed by: Dorie Rank, MD Authorized by: Dorie Rank, MD   Critical care provider statement:    Critical care time (minutes):  45   Critical care was time spent personally by me on the following activities:  Discussions with consultants, evaluation of patient's response to treatment, examination of patient, ordering and performing treatments and interventions, ordering and review of laboratory studies, ordering and review of radiographic studies, pulse oximetry, re-evaluation of patient's condition, obtaining history from patient or surrogate and review of old charts   (  including critical care time)  Medications Ordered in ED Medications  sodium chloride 0.9 % bolus 500 mL (500 mLs Intravenous New Bag/Given 02/24/18 1152)  0.9 %  sodium chloride infusion (has no administration in time range)  norepinephrine (LEVOPHED) 4mg  in NS 261mL premix infusion (has no administration in time range)  patiromer Daryll Drown) packet 16.8 g (has no administration in time range)  heparin injection 1,000-6,000 Units (has no administration in time range)  heparin 10,000 units/ 20 mL infusion syringe (has no administration in time range)  heparin bolus via infusion syringe 1,000 Units (has no administration in time range)  heparinized saline (2000 units/L) primer fluid for CRRT (has no administration in time range)  prismasol BGK 0/2.5 infusion (has no administration in time range)  prismasol BGK 0/2.5 infusion (has no  administration in time range)  prismasol BGK 0/2.5 infusion (has no administration in time range)  albuterol (PROVENTIL) (2.5 MG/3ML) 0.083% nebulizer solution 10 mg (10 mg Nebulization Given 02/24/18 1115)  insulin aspart (novoLOG) injection 5 Units (5 Units Intravenous Given 02/24/18 1152)    And  dextrose 50 % solution 50 mL (50 mLs Intravenous Given 02/24/18 1153)  calcium gluconate inj 10% (1 g) URGENT USE ONLY! (1 g Intravenous Given 02/24/18 1205)  sodium bicarbonate injection 50 mEq (50 mEq Intravenous Given 02/24/18 1205)     Initial Impression / Assessment and Plan / ED Course  I have reviewed the triage vital signs and the nursing notes.  Pertinent labs & imaging results that were available during my care of the patient were reviewed by me and considered in my medical decision making (see chart for details).  Clinical Course as of Feb 25 1215  Mon Feb 24, 2018  1106 Patient EKG is change.  His symptoms are concerning for the possibility of hyperkalemia.  Laboratory tests are pending   [JK]  8099 Initial potassium is greater than 8 on the i-STAT.   [JK]  1157 Persistent hypotension.  Hyperkalemia treatment algorithm ordered.  I will also start him on personal vasopressors   [JK]  1211 Pt seen by Dr Carolin Sicks.  Plan on CVVD.  D/w [facility], ICU.   [JK]    Clinical Course User Index [JK] Dorie Rank, MD    Patient presented to the emergency room for evaluation of generalized weakness.  Patient's EKG was concerning for the possibility of hyperkalemia.  Laboratory tests confirm that diagnosis.  Since potassium is extremely elevated.  Nephrology was consulted.  I also consulted with critical care.  Patient was treated with calcium, sodium bicarb, insulin, dextrose, and albuterol.  He continues to have a wide-complex rhythm.  I have ordered additional sodium bicarb and calcium.  I will start the patient on the bicarb drip.  We will continue to monitor closely until he is transported to the  ICU  Final Clinical Impressions(s) / ED Diagnoses   Final diagnoses:  Hyperkalemia  Hypotension, unspecified hypotension type      Dorie Rank, MD 02/24/18 1220

## 2018-02-24 NOTE — Progress Notes (Signed)
Pharmacy Antibiotic Note  Daniel Whitney is a 63 y.o. male admitted on 02/24/2018 with malaise and generalized weakness, found to have hyperkalemia and hypotension.  Pharmacy has been consulted for vancomycin and Zosyn dosing for sepsis.  Patient has ESRD on MWF dialysis; however, patient missed today's session because he is ill.  To start CRRT.  Afebrile, WBC WNL, LA 11.1.  Plan: Vanc 2000mg  IV x 1, then 1gm IV Q24H Zosyn 3.375gm IV Q6H, infuse each dose over 30 min Monitor CRRT tolerance/interruption, clinical progress, vanc level as indicated  Height: 5\' 9"  (175.3 cm) Weight: 256 lb 13.4 oz (116.5 kg) IBW/kg (Calculated) : 70.7  Temp (24hrs), Avg:97.4 F (36.3 C), Min:97.1 F (36.2 C), Max:97.8 F (36.6 C)  Recent Labs  Lab 02/24/18 1122 02/24/18 1156 02/24/18 1455  WBC 8.8  --   --   CREATININE 13.82* 14.80* 13.70*  LATICACIDVEN  --   --  11.1*    Estimated Creatinine Clearance: 6.9 mL/min (A) (by C-G formula based on SCr of 13.7 mg/dL (H)).    Allergies  Allergen Reactions  . Tobacco [Nicotiana Tabacum] Shortness Of Breath    Vanc 1/13 >> Zosyn 1/13 >>  1/13 MRSA PCR - negative 1/13 BCx -   Prentiss Polio D. Mina Marble, PharmD, BCPS, [place] 02/24/2018, 4:37 PM

## 2018-02-24 NOTE — Procedures (Signed)
Central Venous Catheter Insertion Procedure Note: iHD catheter Daniel Whitney [id number] 1955-02-24  Procedure: Insertion of Central Venous Catheter Indications: Emergent CRRT  Procedure Details Consent: Risks of procedure as well as the alternatives and risks of each were explained to the (patient/caregiver).  Consent for procedure obtained. Time Out: Verified patient identification, verified procedure, site/side was marked, verified correct patient position, special equipment/implants available, medications/allergies/relevent history reviewed, required imaging and test results available.  Performed  Maximum sterile technique was used including antiseptics, cap, gloves, gown, hand hygiene, mask and sheet. Skin prep: Chlorhexidine; Analgesia: Lidocaine local anesthetic administered A antimicrobial bonded/coated triple lumen catheter was placed in the left internal jugular vein using the Seldinger technique.  Evaluation Blood flow good Complications: No apparent complications Patient did tolerate procedure well. Chest X-ray ordered to verify placement.  CXR: normal.  Daniel Whitney 02/24/2018, 2:51 PM

## 2018-02-25 ENCOUNTER — Inpatient Hospital Stay (HOSPITAL_COMMUNITY): Payer: Medicare Other

## 2018-02-25 DIAGNOSIS — N186 End stage renal disease: Secondary | ICD-10-CM

## 2018-02-25 LAB — RENAL FUNCTION PANEL
ALBUMIN: 3.3 g/dL — AB (ref 3.5–5.0)
ALBUMIN: 3 g/dL — AB (ref 3.5–5.0)
ANION GAP: 9 (ref 5–15)
Albumin: 2.9 g/dL — ABNORMAL LOW (ref 3.5–5.0)
Anion gap: 17 — ABNORMAL HIGH (ref 5–15)
Anion gap: 12 (ref 5–15)
BUN: 53 mg/dL — ABNORMAL HIGH (ref 8–23)
BUN: 40 mg/dL — ABNORMAL HIGH (ref 8–23)
BUN: 23 mg/dL (ref 8–23)
CALCIUM: 7.9 mg/dL — AB (ref 8.9–10.3)
CO2: 21 mmol/L — ABNORMAL LOW (ref 22–32)
CO2: 25 mmol/L (ref 22–32)
CO2: 28 mmol/L (ref 22–32)
Calcium: 7.6 mg/dL — ABNORMAL LOW (ref 8.9–10.3)
Calcium: 7.5 mg/dL — ABNORMAL LOW (ref 8.9–10.3)
Chloride: 96 mmol/L — ABNORMAL LOW (ref 98–111)
Chloride: 97 mmol/L — ABNORMAL LOW (ref 98–111)
Chloride: 99 mmol/L (ref 98–111)
Creatinine, Ser: 8.71 mg/dL — ABNORMAL HIGH (ref 0.61–1.24)
Creatinine, Ser: 7.05 mg/dL — ABNORMAL HIGH (ref 0.61–1.24)
Creatinine, Ser: 4.29 mg/dL — ABNORMAL HIGH (ref 0.61–1.24)
GFR calc Af Amer: 7 mL/min — ABNORMAL LOW (ref >60)
GFR calc Af Amer: 9 mL/min — ABNORMAL LOW (ref >60)
GFR calc non Af Amer: 6 mL/min — ABNORMAL LOW (ref >60)
GFR calc non Af Amer: 8 mL/min — ABNORMAL LOW (ref >60)
GFR calc non Af Amer: 14 mL/min — ABNORMAL LOW (ref >60)
GFR, EST AFRICAN AMERICAN: 16 mL/min — AB (ref >60)
GLUCOSE: 70 mg/dL (ref 70–99)
GLUCOSE: 166 mg/dL — AB (ref 70–99)
Glucose, Bld: 106 mg/dL — ABNORMAL HIGH (ref 70–99)
PHOSPHORUS: 5.6 mg/dL — AB (ref 2.5–4.6)
PHOSPHORUS: 4.5 mg/dL (ref 2.5–4.6)
Phosphorus: 2 mg/dL — ABNORMAL LOW (ref 2.5–4.6)
Potassium: 4.9 mmol/L (ref 3.5–5.1)
Potassium: 5.1 mmol/L (ref 3.5–5.1)
Potassium: 3.9 mmol/L (ref 3.5–5.1)
SODIUM: 134 mmol/L — AB (ref 135–145)
SODIUM: 134 mmol/L — AB (ref 135–145)
Sodium: 136 mmol/L (ref 135–145)

## 2018-02-25 LAB — POCT ACTIVATED CLOTTING TIME
ACTIVATED CLOTTING TIME: 164 s
ACTIVATED CLOTTING TIME: 180 s
ACTIVATED CLOTTING TIME: 197 s
Activated Clotting Time: 125 seconds
Activated Clotting Time: 142 seconds
Activated Clotting Time: 153 seconds
Activated Clotting Time: 158 seconds
Activated Clotting Time: 164 seconds
Activated Clotting Time: 169 seconds
Activated Clotting Time: 169 seconds
Activated Clotting Time: 180 seconds
Activated Clotting Time: 180 seconds
Activated Clotting Time: 180 seconds
Activated Clotting Time: 186 seconds
Activated Clotting Time: 186 seconds
Activated Clotting Time: 197 seconds
Activated Clotting Time: 197 seconds

## 2018-02-25 LAB — GLUCOSE, CAPILLARY
Glucose-Capillary: 116 mg/dL — ABNORMAL HIGH (ref 70–99)
Glucose-Capillary: 149 mg/dL — ABNORMAL HIGH (ref 70–99)
Glucose-Capillary: 153 mg/dL — ABNORMAL HIGH (ref 70–99)
Glucose-Capillary: 90 mg/dL (ref 70–99)
Glucose-Capillary: 99 mg/dL (ref 70–99)

## 2018-02-25 LAB — APTT: aPTT: 94 seconds — ABNORMAL HIGH (ref 24–36)

## 2018-02-25 LAB — CBC
HCT: 28.4 % — ABNORMAL LOW (ref 39.0–52.0)
Hemoglobin: 9.2 g/dL — ABNORMAL LOW (ref 13.0–17.0)
MCH: 33.3 pg (ref 26.0–34.0)
MCHC: 32.4 g/dL (ref 30.0–36.0)
MCV: 102.9 fL — ABNORMAL HIGH (ref 80.0–100.0)
Platelets: 150 10*3/uL (ref 150–400)
RBC: 2.76 MIL/uL — ABNORMAL LOW (ref 4.22–5.81)
RDW: 15.8 % — ABNORMAL HIGH (ref 11.5–15.5)
WBC: 7.3 10*3/uL (ref 4.0–10.5)
nRBC: 0 % (ref 0.0–0.2)

## 2018-02-25 LAB — MAGNESIUM: Magnesium: 2.4 mg/dL (ref 1.7–2.4)

## 2018-02-25 LAB — LACTIC ACID, PLASMA: Lactic Acid, Venous: 1.7 mmol/L (ref 0.5–1.9)

## 2018-02-25 MED ORDER — SIMVASTATIN 20 MG PO TABS
20.0000 mg | ORAL_TABLET | Freq: Every day | ORAL | Status: DC
  Administered 2018-02-25 – 2018-02-28 (×4): 20 mg via ORAL
  Filled 2018-02-25 (×4): qty 1

## 2018-02-25 MED ORDER — CHLORHEXIDINE GLUCONATE CLOTH 2 % EX PADS
6.0000 | MEDICATED_PAD | Freq: Every day | CUTANEOUS | Status: DC
  Administered 2018-02-25 – 2018-02-28 (×3): 6 via TOPICAL

## 2018-02-25 MED ORDER — GABAPENTIN 100 MG PO CAPS
200.0000 mg | ORAL_CAPSULE | Freq: Every day | ORAL | Status: DC
  Administered 2018-02-25 – 2018-02-27 (×4): 200 mg via ORAL
  Filled 2018-02-25 (×4): qty 2

## 2018-02-25 MED ORDER — PRISMASOL BGK 4/2.5 32-4-2.5 MEQ/L IV SOLN
INTRAVENOUS | Status: DC
  Administered 2018-02-25: 12:00:00 via INTRAVENOUS_CENTRAL
  Filled 2018-02-25 (×13): qty 5000

## 2018-02-25 MED ORDER — PRISMASOL BGK 0/2.5 32-2.5 MEQ/L REPLACEMENT SOLN
Status: DC
  Administered 2018-02-25 (×2): via INTRAVENOUS_CENTRAL
  Filled 2018-02-25 (×5): qty 5000

## 2018-02-25 MED ORDER — ALLOPURINOL 100 MG PO TABS
100.0000 mg | ORAL_TABLET | Freq: Two times a day (BID) | ORAL | Status: DC
  Administered 2018-02-25 – 2018-02-28 (×7): 100 mg via ORAL
  Filled 2018-02-25 (×7): qty 1

## 2018-02-25 MED ORDER — PRISMASOL BGK 0/2.5 32-2.5 MEQ/L REPLACEMENT SOLN
Status: DC
  Administered 2018-02-25: 10:00:00 via INTRAVENOUS_CENTRAL
  Filled 2018-02-25 (×4): qty 5000

## 2018-02-25 MED ORDER — AMITRIPTYLINE HCL 25 MG PO TABS
50.0000 mg | ORAL_TABLET | Freq: Every day | ORAL | Status: DC
  Administered 2018-02-25 – 2018-02-27 (×3): 50 mg via ORAL
  Filled 2018-02-25: qty 1
  Filled 2018-02-25: qty 2
  Filled 2018-02-25: qty 1

## 2018-02-25 NOTE — Progress Notes (Signed)
Responded to unit nurse call to provide support to patient.  Patient asked for chaplain, upon my arrival patient wanted a [name] clergy.  I told patient that they come during the week and visit all [name] and his name would be added to their list. Patient was good with that. Will follow as needed.  Jaclynn Major, [place], [facility], Pager [phone]

## 2018-02-25 NOTE — Progress Notes (Signed)
Evaluation of AVF duplex completed. Preliminary results in Chart Review CV [place], [place] 02/25/2018, 4:06 PM

## 2018-02-25 NOTE — Progress Notes (Signed)
NAME:  Daniel Whitney, MRN:  [id number], DOB:  [birth year], LOS: 1 ADMISSION DATE:  02/24/2018, CONSULTATION DATE:  02/24/2018 REFERRING MD:  Dr. Tomi Bamberger CHIEF COMPLAINT:  Daniel Whitney    Brief History   63 year old African-American gentleman with end-stage renal disease on intermittent hemodialysis (M/W/F), atrial fibrillation, pulmonary hypertension, CAD, Hx of CVA, MI who presented to the ED on 02/24/2018 with complaint of generalized malaise.  He was found to be hypotensive requiring pressors and labs significant for severe hyperkalemia  Past Medical History  ESRD on intermittent HD CHF Hypertension  CVA Subdural Hematoma  Pulmonary HTN  CAD Obstructive sleep apnea Atrial fibrillation  Significant Hospital Events   1/13> admitted  Consults:  Nephrology PCCM  Procedures:  1/13 CVC>  Significant Diagnostic Tests:  CXR 1/13>  No obvious infiltrates appreciated   Micro Data:  1/13 Blood Cx>  Antimicrobials:  None    Interim history/subjective:  Overnight: CRRT initiated   Today, Daniel Whitney reports he feels much better. He denies fevers, chills, nausea, vomiting, SOB, CP, headache.   Objective   Blood pressure (!) 108/97, pulse (!) 119, temperature 99.4 F (37.4 C), temperature source Oral, resp. rate (!) 24, height 5\' 9"  (1.753 m), weight 115.5 kg, SpO2 99 %.        Intake/Output Summary (Last 24 hours) at 02/25/2018 0737 Last data filed at 02/25/2018 0600 Gross per 24 hour  Intake 2795.16 ml  Output 2348 ml  Net 447.16 ml   Filed Weights   02/24/18 1045 02/24/18 1400 02/25/18 0400  Weight: 114 kg 116.5 kg 115.5 kg    Examination: General: In no acute distress, overly obese HENT: Left CVC in place Lungs: CTABL, no wheezes or crackles  Cardiovascular: RRR, no MGR Abdomen: Distended secondary to body habitus, bowel sounds present, nontender to palpation Extremities: No edema, dialysis access on L. arm  Neuro: Alert and oriented   Resolved Hospital Problem list      Assessment & Plan:  63 year old African-American gentleman with ESRD presenting with generalized malaise found to have severe hyperkalemia after missing hemodialysis  ESRD Hyperkalemia -resolved - Continue CRRT until 6pm today  - Follow-up BMP - Discontinue bicarb infusion  Shock with unclear etiology -Etiology of shock still unclear at this point.  Has remained afebrile and without leukocytosis.  Differential diagnosis includes septic shock versus hypovolemic shock versus cardiogenic shock versus distributive shock -BP and HR stable  -Follow-up blood culture -Discontinue antibiotics  -Off pressors   Hx Diabetes mellitus with Neuropathy  -Previously on Januvia but PCP discontinued medication in June -Continue sliding scale insulin -Resume gabapentin, Elavil   Hypertension -BP Stable  -Hold Coreg  Atrial fibrillation - Continue cardiac monitoring - HR in 80s  CHF - Chest x-ray on admission with no evidence of volume overload.  Echocardiogram in February 2019 reviewed EF 50-55% with normal systolic function though diastolic dysfunction was not able to be evaluated  Coronary artery disease Hyperlipidemia -Resume  Zocor  Anemia, stable -Hemoglobin this morning of 9.2.  Gouty Arthritis  -Continue Allopurinol    Best practice:  Diet: Renal diet   Pain/Anxiety/Delirium protocol (if indicated): none VAP protocol (if indicated): n/a DVT prophylaxis: heparin GI prophylaxis:  Glucose control: SSI Mobility: bedrest Code Status: Full (of note NO BLOOD PRODUCT ADMINISTRATION)  Family Communication:  None at bedside Disposition: Admit to ICU   Labs   CBC: Recent Labs  Lab 02/24/18 1122 02/24/18 1156 02/25/18 0330  WBC 8.8  --  7.3  NEUTROABS 7.5  --   --  HGB 10.0* 12.2* 9.2*  HCT 33.6* 36.0* 28.4*  MCV 108.4*  --  102.9*  PLT 240  --  270    Basic Metabolic Panel: Recent Labs  Lab 02/24/18 1122 02/24/18 1156 02/24/18 1258 02/24/18 1455  02/24/18 2023 02/25/18 0330  NA 129* 126*  --  131* 134*  133* 134*  K >7.5* 8.5* 7.4* 7.2* 4.9  4.8 5.1  CL 94* 100  --  94* 96*  96* 97*  CO2 12*  --   --  13* 21*  20* 25  GLUCOSE 92 88  --  225* 70  70 106*  BUN 78* 80*  --  78* 53*  54* 40*  CREATININE 13.82* 14.80*  --  13.70* 8.71*  8.80* 7.05*  CALCIUM 8.1*  --   --  9.2 7.9*  7.8* 7.6*  MG  --   --  2.6*  --   --  2.4  PHOS  --   --  8.5* 8.6* 5.6* 4.5   GFR: Estimated Creatinine Clearance: 13.4 mL/min (A) (by C-G formula based on SCr of 7.05 mg/dL (H)). Recent Labs  Lab 02/24/18 1122 02/24/18 1455 02/24/18 2023 02/25/18 0330  WBC 8.8  --   --  7.3  LATICACIDVEN  --  11.1* 5.0* 1.7    Liver Function Tests: Recent Labs  Lab 02/24/18 1455 02/24/18 2023 02/25/18 0330  ALBUMIN 3.1* 3.3* 3.0*   No results for input(s): LIPASE, AMYLASE in the last 168 hours. No results for input(s): AMMONIA in the last 168 hours.  ABG    Component Value Date/Time   PHART 7.266 (L) 01/03/2015 1958   PCO2ART 47.8 (H) 01/03/2015 1958   PO2ART 338.0 (H) 01/03/2015 1958   HCO3 21.7 01/03/2015 1958   TCO2 15 (L) 02/24/2018 1156   ACIDBASEDEF 5.0 (H) 01/03/2015 1958   O2SAT 100.0 01/03/2015 1958     Coagulation Profile: No results for input(s): INR, PROTIME in the last 168 hours.  Cardiac Enzymes: No results for input(s): CKTOTAL, CKMB, CKMBINDEX, TROPONINI in the last 168 hours.  HbA1C: Hgb A1c MFr Bld  Date/Time Value Ref Range Status  06/19/2016 10:03 AM 6.2 (H) 4.8 - 5.6 % Final    Comment:    (NOTE)         Pre-diabetes: 5.7 - 6.4         Diabetes: >6.4         Glycemic control for adults with diabetes: <7.0   01/17/2016 11:15 AM 6.7 (H) 4.8 - 5.6 % Final    Comment:    (NOTE)         Pre-diabetes: 5.7 - 6.4         Diabetes: >6.4         Glycemic control for adults with diabetes: <7.0     CBG: Recent Labs  Lab 02/24/18 1501 02/24/18 1920 02/24/18 2324 02/24/18 2354 02/25/18 0320  GLUCAP 207*  79 50* 149* 90

## 2018-02-25 NOTE — Progress Notes (Addendum)
[facility] PROGRESS NOTE  Assessment/ Plan: Pt is a 63 y.o. yo male with history of diabetes, hypertension, stroke, hyperlipidemia, obesity, ESRD on dialysis at [facility] MWF presented with shock, hyperkalemia potassium 8.5.  Dialyzes at [facility], 4 hours 15 minutes, 450/800  EDW 113. HD Bath 2 k, 3.5 ca, Dialyzer 180 optiflux, Heparin 3000 units bolus. Access AVF. Last HD on 02/21/18, full treatment, left 2.7 kg above DW. mircera 60 q2weekly last dose on 02/09/18.  # Severe hyperkalemia: Potassium level 8.5 on admission.  Exact etiology of hyperkalemia unknown since he had a complete dialysis treatment on Friday.   -Serum potassium level improved with CRRT.  -repeat lab in the afteroon.  -Patient reported that he had clot in his fistula 2-3 weeks ago when he was [facility] and had vascular procedure. I will order duplex AVF for access evaluation.   # Shock: SBP 70s in ER required levophed.  BP is better now.  Off pressors.  Continue to monitor. Lactic acid improved from 11.1 to 1.7 today. Further management per critical care team.  # ESRD: MWF, -Continue CRRT with ultrafiltration 50 cc an hour.  Plan to discontinue CRRT today evening and try intermittent hemodialysis tomorrow.  I changed potassium bath to 0K in pre- and post-filter and continue 4 K in dialysate.  -IV heparin for anticoagulation, -discussed with ICU nurse.   # Anemia of ESRD: Hemoglobin 12.2, dropped to 9.2 today.  Last dose of Mircera on 02/09/2018. No sign of bleeding.   # Metabolic Bone Disease: Monitor calcium and phosphorus.  #Hypervolemic hyponatremia: monitor BMP.  Subjective: Seen and examined at bedside.  He is off levo.  He reports his shortness of breath is much better.  Denied nausea, vomiting, headache or dizziness. Objective Vital signs in last 24 hours: Vitals:   02/25/18 0742 02/25/18 0800 02/25/18 0900 02/25/18 1000  BP:  116/67 117/65   Pulse:  89 96 89  Resp:  (!) 21 (!)  24 (!) 23  Temp: 99.8 F (37.7 C)     TempSrc: Axillary     SpO2:  100% 94% 100%  Weight:      Height:       Weight change:   Intake/Output Summary (Last 24 hours) at 02/25/2018 1029 Last data filed at 02/25/2018 1000 Gross per 24 hour  Intake 3223.9 ml  Output 2763 ml  Net 460.9 ml       Labs: Basic Metabolic Panel: Recent Labs  Lab 02/24/18 1455 02/24/18 2023 02/25/18 0330  NA 131* 134*  133* 134*  K 7.2* 4.9  4.8 5.1  CL 94* 96*  96* 97*  CO2 13* 21*  20* 25  GLUCOSE 225* 70  70 106*  BUN 78* 53*  54* 40*  CREATININE 13.70* 8.71*  8.80* 7.05*  CALCIUM 9.2 7.9*  7.8* 7.6*  PHOS 8.6* 5.6* 4.5   Liver Function Tests: Recent Labs  Lab 02/24/18 1455 02/24/18 2023 02/25/18 0330  ALBUMIN 3.1* 3.3* 3.0*   No results for input(s): LIPASE, AMYLASE in the last 168 hours. No results for input(s): AMMONIA in the last 168 hours. CBC: Recent Labs  Lab 02/24/18 1122 02/24/18 1156 02/25/18 0330  WBC 8.8  --  7.3  NEUTROABS 7.5  --   --   HGB 10.0* 12.2* 9.2*  HCT 33.6* 36.0* 28.4*  MCV 108.4*  --  102.9*  PLT 240  --  150   Cardiac Enzymes: No results for input(s): CKTOTAL, CKMB, CKMBINDEX, TROPONINI in  the last 168 hours. CBG: Recent Labs  Lab 02/24/18 1920 02/24/18 2324 02/24/18 2354 02/25/18 0320 02/25/18 0737  GLUCAP 79 50* 149* 90 99    Iron Studies: No results for input(s): IRON, TIBC, TRANSFERRIN, FERRITIN in the last 72 hours. Studies/Results: Dg Chest Port 1 View  Result Date: 02/24/2018 CLINICAL DATA:  Central line placement. EXAM: PORTABLE CHEST 1 VIEW 2:44 p.m. COMPARISON:  02/24/2018 11:33 a.m. and 01/01/2018 FINDINGS: A double lumen central venous catheter is been inserted. The tip is in the region of the superior vena cava at approximately the level of the carina. No pneumothorax. Pulmonary vascularity is normal and the lungs are clear. Cardiac silhouette is within normal limits considering the AP portable technique. IMPRESSION:  Central line tips in the superior vena cava approximately at the level of the carina as described. No acute abnormalities. Electronically Signed   By: Lorriane Shire M.D.   On: 02/24/2018 14:56   Dg Chest Port 1 View  Result Date: 02/24/2018 CLINICAL DATA:  Weakness. EXAM: PORTABLE CHEST 1 VIEW COMPARISON:  01/01/2018 and 04/03/2017 FINDINGS: Heart size and mediastinal contours are within normal limits considering the AP portable technique. Pulmonary vascularity is normal. No infiltrates or effusions. No acute bone abnormality. IMPRESSION: No active disease. Electronically Signed   By: Lorriane Shire M.D.   On: 02/24/2018 11:49    Medications: Infusions: . sodium chloride 10 mL/hr at 02/25/18 1000  . calcium gluconate    . heparin 10,000 units/ 20 mL infusion syringe 1,750 Units/hr (02/25/18 0956)  . heparin 999 mL/hr at 02/24/18 1548  . phenylephrine (NEO-SYNEPHRINE) Adult infusion Stopped (02/24/18 1506)  . prismasol BGK 0/2.5 500 mL/hr at 02/25/18 0939  . prismasol BGK 0/2.5 500 mL/hr at 02/25/18 0939  . prismasol BGK 4/2.5      Scheduled Medications: . allopurinol  100 mg Oral BID  . amitriptyline  50 mg Oral QHS  . folic acid  1 mg Intravenous Daily  . gabapentin  200 mg Oral QHS  . heparin  5,000 Units Subcutaneous Q8H  . insulin aspart  0-9 Units Subcutaneous Q4H  . simvastatin  20 mg Oral Daily  . thiamine injection  100 mg Intravenous Daily    have reviewed scheduled and prn medications.  Physical Exam: General:NAD, comfortable Heart:RRR, s1s2 nl Lungs:clear b/l, no crackle Abdomen:soft, Non-tender, non-distended Extremities:No edema Dialysis Access: AV fistula with thrill and bruit positive.  Hopie Pellegrin Prasad Marlia Schewe 02/25/2018,10:29 AM  LOS: 1 day

## 2018-02-25 NOTE — Progress Notes (Signed)
CRITICAL VALUE ALERT  Critical Value:  LA 5.0  Date & Time Notied:  02/24/2018  Provider Notified: Dr. Carolin Sicks   Orders Received/Actions taken: Received order for change in Prismasol concentration

## 2018-02-26 LAB — GLUCOSE, CAPILLARY
GLUCOSE-CAPILLARY: 152 mg/dL — AB (ref 70–99)
Glucose-Capillary: 113 mg/dL — ABNORMAL HIGH (ref 70–99)
Glucose-Capillary: 133 mg/dL — ABNORMAL HIGH (ref 70–99)
Glucose-Capillary: 140 mg/dL — ABNORMAL HIGH (ref 70–99)
Glucose-Capillary: 166 mg/dL — ABNORMAL HIGH (ref 70–99)

## 2018-02-26 LAB — RENAL FUNCTION PANEL
ALBUMIN: 2.8 g/dL — AB (ref 3.5–5.0)
Anion gap: 9 (ref 5–15)
BUN: 31 mg/dL — ABNORMAL HIGH (ref 8–23)
CO2: 25 mmol/L (ref 22–32)
Calcium: 7.3 mg/dL — ABNORMAL LOW (ref 8.9–10.3)
Chloride: 98 mmol/L (ref 98–111)
Creatinine, Ser: 6.12 mg/dL — ABNORMAL HIGH (ref 0.61–1.24)
GFR calc Af Amer: 10 mL/min — ABNORMAL LOW (ref >60)
GFR calc non Af Amer: 9 mL/min — ABNORMAL LOW (ref >60)
GLUCOSE: 134 mg/dL — AB (ref 70–99)
Phosphorus: 3.6 mg/dL (ref 2.5–4.6)
Potassium: 4.3 mmol/L (ref 3.5–5.1)
Sodium: 132 mmol/L — ABNORMAL LOW (ref 135–145)

## 2018-02-26 LAB — CBC
HCT: 29.7 % — ABNORMAL LOW (ref 39.0–52.0)
HEMOGLOBIN: 9.5 g/dL — AB (ref 13.0–17.0)
MCH: 33.8 pg (ref 26.0–34.0)
MCHC: 32 g/dL (ref 30.0–36.0)
MCV: 105.7 fL — ABNORMAL HIGH (ref 80.0–100.0)
Platelets: 141 10*3/uL — ABNORMAL LOW (ref 150–400)
RBC: 2.81 MIL/uL — ABNORMAL LOW (ref 4.22–5.81)
RDW: 15.9 % — AB (ref 11.5–15.5)
WBC: 4.8 10*3/uL (ref 4.0–10.5)
nRBC: 0 % (ref 0.0–0.2)

## 2018-02-26 LAB — PROTIME-INR
INR: 1
Prothrombin Time: 13.1 seconds (ref 11.4–15.2)

## 2018-02-26 LAB — APTT: aPTT: 36 seconds (ref 24–36)

## 2018-02-26 LAB — MAGNESIUM: Magnesium: 2.5 mg/dL — ABNORMAL HIGH (ref 1.7–2.4)

## 2018-02-26 MED ORDER — DARBEPOETIN ALFA 60 MCG/0.3ML IJ SOSY
60.0000 ug | PREFILLED_SYRINGE | INTRAMUSCULAR | Status: DC
  Administered 2018-02-26: 60 ug via INTRAVENOUS
  Filled 2018-02-26: qty 0.3

## 2018-02-26 MED ORDER — HEPARIN SODIUM (PORCINE) 1000 UNIT/ML DIALYSIS: 3000.0000 [IU] | Freq: Once | INTRAMUSCULAR | Status: DC

## 2018-02-26 MED ORDER — HEPARIN SODIUM (PORCINE) 1000 UNIT/ML DIALYSIS: 1000.0000 [IU] | INTRAMUSCULAR | Status: DC | PRN

## 2018-02-26 MED ORDER — SODIUM CHLORIDE 0.9 % IV SOLN: 100.0000 mL | INTRAVENOUS | Status: DC | PRN

## 2018-02-26 MED ORDER — ALTEPLASE 2 MG IJ SOLR: 2.0000 mg | Freq: Once | INTRAMUSCULAR | Status: DC | PRN

## 2018-02-26 MED ORDER — PENTAFLUOROPROP-TETRAFLUOROETH EX AERO: 1.0000 "application " | INHALATION_SPRAY | CUTANEOUS | Status: DC | PRN

## 2018-02-26 MED ORDER — INSULIN ASPART 100 UNIT/ML ~~LOC~~ SOLN: 0.0000 [IU] | Freq: Every day | SUBCUTANEOUS | Status: DC

## 2018-02-26 MED ORDER — DIPHENHYDRAMINE HCL 12.5 MG/5ML PO ELIX
12.5000 mg | ORAL_SOLUTION | Freq: Three times a day (TID) | ORAL | Status: DC | PRN
  Administered 2018-02-26 (×2): 12.5 mg via ORAL
  Filled 2018-02-26 (×3): qty 5

## 2018-02-26 MED ORDER — LIDOCAINE-PRILOCAINE 2.5-2.5 % EX CREA: 1.0000 "application " | TOPICAL_CREAM | CUTANEOUS | Status: DC | PRN

## 2018-02-26 MED ORDER — INSULIN ASPART 100 UNIT/ML ~~LOC~~ SOLN
0.0000 [IU] | Freq: Three times a day (TID) | SUBCUTANEOUS | Status: DC
  Administered 2018-02-26: 1 [IU] via SUBCUTANEOUS
  Administered 2018-02-27 (×2): 2 [IU] via SUBCUTANEOUS

## 2018-02-26 MED ORDER — CARVEDILOL 25 MG PO TABS: 25.0000 mg | ORAL_TABLET | ORAL | Status: DC

## 2018-02-26 MED ORDER — DARBEPOETIN ALFA 60 MCG/0.3ML IJ SOSY
PREFILLED_SYRINGE | INTRAMUSCULAR | Status: AC
  Administered 2018-02-26: 60 ug
  Filled 2018-02-26: qty 0.3

## 2018-02-26 MED ORDER — CARVEDILOL 12.5 MG PO TABS
12.5000 mg | ORAL_TABLET | ORAL | Status: DC
  Filled 2018-02-26 (×2): qty 1

## 2018-02-26 MED ORDER — CARVEDILOL 25 MG PO TABS
25.0000 mg | ORAL_TABLET | ORAL | Status: DC
  Filled 2018-02-26: qty 1

## 2018-02-26 MED ORDER — LIDOCAINE HCL (PF) 1 % IJ SOLN: 5.0000 mL | INTRAMUSCULAR | Status: DC | PRN

## 2018-02-26 NOTE — Progress Notes (Addendum)
NAME:  Daniel Whitney, MRN:  [id number], DOB:  10/12/1955, LOS: 2 ADMISSION DATE:  02/24/2018, CONSULTATION DATE:  02/24/2018 REFERRING MD:  Dr. Tomi Bamberger, CHIEF COMPLAINT:  Malaise    Brief History   63 year old African-American gentleman with end-stage renal disease on intermittent hemodialysis (M/W/F), atrial fibrillation, pulmonary hypertension, CAD, Hx of CVA, MI who presented to the ED on 02/24/2018 with complaint of generalized malaise.  He was found to be hypotensive requiring pressors and labs significant for severe hyperkalemia  Past Medical History  ESRD on intermittent HD CHF Hypertension  CVA Subdural Hematoma  Pulmonary HTN  CAD Obstructive sleep apnea Atrial fibrillation  Significant Hospital Events   1/13> admitted  Consults:  Nephrology PCCM  Procedures:  1/13 CVC>  Significant Diagnostic Tests:  CXR 1/13>No obvious infiltrates appreciated   Micro Data:  1/13 Blood Cx>  Antimicrobials:  1/13 Vancomycin>1/14 1/13 Zosyn>1/14  Interim history/subjective:  Overnight: Underwent Duplex ultrasound of Left AV Fistula   Today, Mr. Daniel Whitney reports he is doing well. He has been tolerating CRRT with no difficulties. He denies chest pain, SOB, nausea, vomiting or lower extremity swelling.   Objective   Blood pressure 104/71, pulse (!) 101, temperature 98.8 F (37.1 C), temperature source Oral, resp. rate (!) 23, height 5\' 9"  (1.753 m), weight 115.5 kg, SpO2 99 %.        Intake/Output Summary (Last 24 hours) at 02/26/2018 0558 Last data filed at 02/26/2018 0000 Gross per 24 hour  Intake 1522.98 ml  Output 1886 ml  Net -363.02 ml   Filed Weights   02/24/18 1045 02/24/18 1400 02/25/18 0400  Weight: 114 kg 116.5 kg 115.5 kg    Examination: General: Comfortably sitting in recliner, In no acute distress, overly obese HENT: Left CVC in place Lungs: CTABL, no wheezes or crackles  Cardiovascular: Irregular, no MGR Abdomen: BS+, NTTP Extremities: No edema, dialysis  access on L. arm  Neuro: Alert and oriented   Resolved Hospital Problem list   Hyperkalemia   Assessment & Plan:  63 year old African-American gentleman with ESRD presenting with generalized malaise found to have severe hyperkalemia after missing hemodialysis  ESRD Hyperkalemia -resolved - Transition to intermittent HD today  - Duplex U/S of Left AVF reveals no thrombus however did show thickened walls  - Appreciate Nephrology recommendation  - Follow-up BMP  Hx Diabetes mellitus with Neuropathy  -Continue sliding scale insulin -Resume gabapentin, Elavil -Current not on any home diabetes medication. Previously on Januvia, however discontinued by PCP due to good glycemic control    Hypertension -BP Stable  -Resume Coreg  Atrial fibrillation - CHADsVASc 5. Previously on Warfarin though he reports he was asked to hold medication in 2018 when he was found to have spontaneous subdural hematoma. He follows up with Dr. Debara Pickett and will require resumption of Warfarin soon given high risk of thrombo-embolic event.  - Continue cardiac monitoring - Resume rate control med with Coreg today   CHF - Chest x-ray on admission with no evidence of volume overload.  Echocardiogram in February 2019 reviewed EF 50-55% with normal systolic function though diastolic dysfunction was not able to be evaluated  Coronary artery disease Hyperlipidemia -Resume  Zocor  Anemia, stable -Hemoglobin this morning of 9.2.  Gouty Arthritis  -Continue Allopurinol   Best practice:  Diet:Renal diet   Pain/Anxiety/Delirium protocol (if indicated):none VAP protocol (if indicated):n/a DVT prophylaxis:heparin GI prophylaxis: None  Glucose control:SSI Mobility:bedrest Code Status:Full (of note NO BLOOD PRODUCT ADMINISTRATION) Family Communication: None at bedside Disposition:Remain in ICU  Labs   CBC: Recent Labs  Lab 02/24/18 1122 02/24/18 1156 02/25/18 0330  WBC 8.8  --  7.3    NEUTROABS 7.5  --   --   HGB 10.0* 12.2* 9.2*  HCT 33.6* 36.0* 28.4*  MCV 108.4*  --  102.9*  PLT 240  --  657    Basic Metabolic Panel: Recent Labs  Lab 02/24/18 1258 02/24/18 1455 02/24/18 2023 02/25/18 0330 02/25/18 1600 02/26/18 0242  NA  --  131* 134*  133* 134* 136 132*  K 7.4* 7.2* 4.9  4.8 5.1 3.9 4.3  CL  --  94* 96*  96* 97* 99 98  CO2  --  13* 21*  20* 25 28 25   GLUCOSE  --  225* 70  70 106* 166* 134*  BUN  --  78* 53*  54* 40* 23 31*  CREATININE  --  13.70* 8.71*  8.80* 7.05* 4.29* 6.12*  CALCIUM  --  9.2 7.9*  7.8* 7.6* 7.5* 7.3*  MG 2.6*  --   --  2.4  --  2.5*  PHOS 8.5* 8.6* 5.6* 4.5 2.0* 3.6   GFR: Estimated Creatinine Clearance: 15.5 mL/min (A) (by C-G formula based on SCr of 6.12 mg/dL (H)). Recent Labs  Lab 02/24/18 1122 02/24/18 1455 02/24/18 2023 02/25/18 0330  WBC 8.8  --   --  7.3  LATICACIDVEN  --  11.1* 5.0* 1.7    Liver Function Tests: Recent Labs  Lab 02/24/18 1455 02/24/18 2023 02/25/18 0330 02/25/18 1600 02/26/18 0242  ALBUMIN 3.1* 3.3* 3.0* 2.9* 2.8*   No results for input(s): LIPASE, AMYLASE in the last 168 hours. No results for input(s): AMMONIA in the last 168 hours.  ABG    Component Value Date/Time   PHART 7.266 (L) 01/03/2015 1958   PCO2ART 47.8 (H) 01/03/2015 1958   PO2ART 338.0 (H) 01/03/2015 1958   HCO3 21.7 01/03/2015 1958   TCO2 15 (L) 02/24/2018 1156   ACIDBASEDEF 5.0 (H) 01/03/2015 1958   O2SAT 100.0 01/03/2015 1958     Coagulation Profile: No results for input(s): INR, PROTIME in the last 168 hours.  Cardiac Enzymes: No results for input(s): CKTOTAL, CKMB, CKMBINDEX, TROPONINI in the last 168 hours.  HbA1C: Hgb A1c MFr Bld  Date/Time Value Ref Range Status  06/19/2016 10:03 AM 6.2 (H) 4.8 - 5.6 % Final    Comment:    (NOTE)         Pre-diabetes: 5.7 - 6.4         Diabetes: >6.4         Glycemic control for adults with diabetes: <7.0   01/17/2016 11:15 AM 6.7 (H) 4.8 - 5.6 % Final     Comment:    (NOTE)         Pre-diabetes: 5.7 - 6.4         Diabetes: >6.4         Glycemic control for adults with diabetes: <7.0     CBG: Recent Labs  Lab 02/25/18 0737 02/25/18 1134 02/25/18 1945 02/25/18 2348 02/26/18 0355  GLUCAP 99 116* 153* 152* 113*

## 2018-02-26 NOTE — Progress Notes (Signed)
Subjective:  CRRT stopped last night- plan is for IHD today.  BP fairly stable- one reading 62/30 ? Accuracy.  Feeling close to normal today    Objective Vital signs in last 24 hours: Vitals:   02/26/18 0400 02/26/18 0500 02/26/18 0600 02/26/18 0700  BP: 120/71  130/79   Pulse: 98  (!) 101   Resp: 19  18   Temp:    98.3 F (36.8 C)  TempSrc:    Oral  SpO2: 100%  92%   Weight:  113.4 kg    Height:       Weight change: -0.6 kg  Intake/Output Summary (Last 24 hours) at 02/26/2018 0743 Last data filed at 02/26/2018 0600 Gross per 24 hour  Intake 1448.77 ml  Output 1577 ml  Net -128.23 ml    [facility]/800EDW 113. HD Bath2 k, 3.5 ca, Dialyzer180 optiflux, Heparin3000 units bolus. AccessAVF.Last HD on 02/21/18, full treatment, left 2.7 kg above DW. mircera 60 q2weekly last dose on 02/09/18.  Assessment/ Plan: Pt is a 63 y.o. yo male ESRD who was admitted on 02/24/2018 with hypotension and hyperkalemia  Assessment/Plan: 1. Hypotension- resolved, uncertain etiology- cultures negative, off abx - plan per CCM was to resume Coreg.  At his EDW this AM but likely will need to be lowered at discharge  2. ESRD - normally MWF at [place]- req temporary CRRT for low BP- now taken off and planning for regular HD on schedule today- can go up to HD unit.  AVF determined to be patent- has had fairly recent intervention in [place]  3. Anemia- is reasonable- due for ESA now, will dose 4. Secondary hyperparathyroidism- phos controlled - no binder at present  5. Hyperkalemia- resolved, also unknown etiology- ruled out issue with access- follow daily while here   [place]: Basic Metabolic Panel: Recent Labs  Lab 02/25/18 0330 02/25/18 1600 02/26/18 0242  NA 134* 136 132*  K 5.1 3.9 4.3  CL 97* 99 98  CO2 25 28 25   GLUCOSE 106* 166* 134*  BUN 40* 23 31*  CREATININE 7.05* 4.29* 6.12*  CALCIUM 7.6* 7.5* 7.3*  PHOS 4.5 2.0* 3.6    Liver Function Tests: Recent Labs  Lab 02/25/18 0330 02/25/18 1600 02/26/18 0242  ALBUMIN 3.0* 2.9* 2.8*   No results for input(s): LIPASE, AMYLASE in the last 168 hours. No results for input(s): AMMONIA in the last 168 hours. CBC: Recent Labs  Lab 02/24/18 1122 02/24/18 1156 02/25/18 0330 02/26/18 0654  WBC 8.8  --  7.3 4.8  NEUTROABS 7.5  --   --   --   HGB 10.0* 12.2* 9.2* 9.5*  HCT 33.6* 36.0* 28.4* 29.7*  MCV 108.4*  --  102.9* 105.7*  PLT 240  --  150 141*   Cardiac Enzymes: No results for input(s): CKTOTAL, CKMB, CKMBINDEX, TROPONINI in the last 168 hours. CBG: Recent Labs  Lab 02/25/18 0737 02/25/18 1134 02/25/18 1945 02/25/18 2348 02/26/18 0355  GLUCAP 99 116* 153* 152* 113*    Iron Studies: No results for input(s): IRON, TIBC, TRANSFERRIN, FERRITIN in the last 72 hours. Studies/Results: Dg Chest Port 1 View  Result Date: 02/24/2018 CLINICAL DATA:  Central line placement. EXAM: PORTABLE CHEST 1 VIEW 2:44 p.m. COMPARISON:  02/24/2018 11:33 a.m. and 01/01/2018 FINDINGS: A double lumen central venous catheter is been inserted. The tip is in the region of the superior vena cava at approximately the level of the carina.  No pneumothorax. Pulmonary vascularity is normal and the lungs are clear. Cardiac silhouette is within normal limits considering the AP portable technique. IMPRESSION: Central line tips in the superior vena cava approximately at the level of the carina as described. No acute abnormalities. Electronically Signed   By: Lorriane Shire M.D.   On: 02/24/2018 14:56   Dg Chest Port 1 View  Result Date: 02/24/2018 CLINICAL DATA:  Weakness. EXAM: PORTABLE CHEST 1 VIEW COMPARISON:  01/01/2018 and 04/03/2017 FINDINGS: Heart size and mediastinal contours are within normal limits considering the AP portable technique. Pulmonary vascularity is normal. No infiltrates or effusions. No acute bone abnormality. IMPRESSION: No active disease. Electronically Signed    By: Lorriane Shire M.D.   On: 02/24/2018 11:49   Vas US Duplex Dialysis Access (avf, Avg)  Result Date: 02/25/2018 DIALYSIS ACCESS Reason for Exam: Thrombus seen at an out of state facility. Access Site: Left Upper Extremity. Access Type: Unable to identify multiple sites in the forearm. Performing Technologist: Toma Copier RVS Supporting Technologist: Oda Cogan RDMS, RVT  Examination Guidelines: A complete evaluation includes B-mode imaging, spectral Doppler, color Doppler, and power Doppler as needed of all accessible portions of each vessel. Unilateral testing is considered an integral part of a complete examination. Limited examinations for reoccurring indications may be performed as noted.  Findings:    Summary: Techncially difficult due to no information on location of placement of the AVF other than it is in the left forearm. Multiple possible sites. All areas able to be visualized appear patent with no obvious evidence of thrombus. However thickened walls are  noted. Other testing is suggested.  *See table(s) above for measurements and observations.  Diagnosing physician: Harold Barban MD Electronically signed by Harold Barban MD on 02/25/2018 at 6:29:50 PM.   --------------------------------------------------------------------------------   Final    Medications: Infusions: . sodium chloride 10 mL/hr at 02/26/18 0400  . calcium gluconate    . heparin 10,000 units/ 20 mL infusion syringe 1,950 Units/hr (02/25/18 1651)  . heparin 999 mL/hr at 02/24/18 1548  . phenylephrine (NEO-SYNEPHRINE) Adult infusion Stopped (02/24/18 1506)  . prismasol BGK 0/2.5 500 mL/hr at 02/25/18 0939  . prismasol BGK 0/2.5 500 mL/hr at 02/25/18 1426  . prismasol BGK 4/2.5 2,000 mL/hr at 02/25/18 1200    Scheduled Medications: . allopurinol  100 mg Oral BID  . amitriptyline  50 mg Oral QHS  . carvedilol  12.5 mg Oral 2 times per day on Mon Wed Fri  . [START ON 02/27/2018] carvedilol  25 mg Oral Once  per day on Sun Tue Thu Sat  . Chlorhexidine Gluconate Cloth  6 each Topical Q0600  . folic acid  1 mg Intravenous Daily  . gabapentin  200 mg Oral QHS  . heparin  5,000 Units Subcutaneous Q8H  . insulin aspart  0-9 Units Subcutaneous Q4H  . simvastatin  20 mg Oral Daily  . thiamine injection  100 mg Intravenous Daily    have reviewed scheduled and prn medications.  Physical Exam: General: sitting up at bedside, nad Heart: RRR Lungs: mostly clear Abdomen: soft,  nontender Extremities: no edema Dialysis Access: left forearm AVF- patent     02/26/2018,7:43 AM  LOS: 2 days

## 2018-02-26 NOTE — Progress Notes (Signed)
eLink Physician-Brief Progress Note Patient Name: Daniel Whitney DOB: May 23, 1955 MRN: [id number]   Date of Service  02/26/2018  HPI/Events of Note  Pruritus  eICU Interventions  Benadryl 12.5 mg po tid prn        Frederik Pear 02/26/2018, 6:06 AM

## 2018-02-26 NOTE — Procedures (Signed)
Patient was seen on dialysis and the procedure was supervised.  BFR 400  Via AVF BP is  110/72.   Patient appears to be tolerating treatment well  Daniel Whitney 02/26/2018

## 2018-02-27 DIAGNOSIS — Z452 Encounter for adjustment and management of vascular access device: Secondary | ICD-10-CM

## 2018-02-27 DIAGNOSIS — I959 Hypotension, unspecified: Secondary | ICD-10-CM

## 2018-02-27 LAB — RENAL FUNCTION PANEL
Albumin: 2.8 g/dL — ABNORMAL LOW (ref 3.5–5.0)
Anion gap: 9 (ref 5–15)
BUN: 19 mg/dL (ref 8–23)
CO2: 27 mmol/L (ref 22–32)
CREATININE: 5.25 mg/dL — AB (ref 0.61–1.24)
Calcium: 9 mg/dL (ref 8.9–10.3)
Chloride: 98 mmol/L (ref 98–111)
GFR calc Af Amer: 12 mL/min — ABNORMAL LOW (ref >60)
GFR calc non Af Amer: 11 mL/min — ABNORMAL LOW (ref >60)
Glucose, Bld: 141 mg/dL — ABNORMAL HIGH (ref 70–99)
Phosphorus: 3.9 mg/dL (ref 2.5–4.6)
Potassium: 3.9 mmol/L (ref 3.5–5.1)
SODIUM: 134 mmol/L — AB (ref 135–145)

## 2018-02-27 LAB — APTT: aPTT: 35 seconds (ref 24–36)

## 2018-02-27 LAB — GLUCOSE, CAPILLARY
Glucose-Capillary: 142 mg/dL — ABNORMAL HIGH (ref 70–99)
Glucose-Capillary: 109 mg/dL — ABNORMAL HIGH (ref 70–99)
Glucose-Capillary: 155 mg/dL — ABNORMAL HIGH (ref 70–99)
Glucose-Capillary: 170 mg/dL — ABNORMAL HIGH (ref 70–99)
Glucose-Capillary: 134 mg/dL — ABNORMAL HIGH (ref 70–99)

## 2018-02-27 LAB — MAGNESIUM: Magnesium: 2.2 mg/dL (ref 1.7–2.4)

## 2018-02-27 MED ORDER — CHLORHEXIDINE GLUCONATE CLOTH 2 % EX PADS
6.0000 | MEDICATED_PAD | Freq: Every day | CUTANEOUS | Status: DC
  Administered 2018-02-27 – 2018-02-28 (×2): 6 via TOPICAL

## 2018-02-27 NOTE — Consult Note (Signed)
   [facility] CM Inpatient Consult   02/27/2018  Daniel Whitney 02/06/1956 [id number]  Patient was assessed for [place] [facility] for community services. Patient was previously active with [place] [facility] with [facility] and [facility] Pharmacist.   Patient has an active consent for [facility] services on file.   Chart review of History and Physical reveals from MD progress notes:  Patient is a 63 year old with end-stage renal disease on hemodialysis, CHF, hypertension, CVA, subdural hematoma, pulmonary hypertension, coronary artery disease Admitted with weakness and missed hemodialysis.  Labs show severe hyperkalemia.  Primary Care Provider: listed as Dr. [name]  Patient transferring out of unit.   Will follow as time permits to assess for needs.  Of note, [facility] Care Management services does not replace or interfere with any services that are arranged by inpatient case management or social work. For additional questions or referrals please contact:  Natividad Brood, RN BSN [facility] Liaison  [phone] business mobile phone Toll free office [phone]

## 2018-02-27 NOTE — Progress Notes (Signed)
[facility]                                                                              Patient Demographics  Daniel Whitney, is a 63 y.o. male, DOB - [birth year], ZDG:[id number]  Admit date - 02/24/2018   Admitting Physician Marshell Garfinkel, MD  Outpatient Primary MD for the patient is Koirala, Dibas, MD  Outpatient specialists:   LOS - 3  days   Medical records reviewed and are as summarized below:    Chief Complaint  Patient presents with  . Fall       Brief summary   63 year old African-American gentleman with end-stage renal disease on intermittent hemodialysis (M/W/F), atrial fibrillation, pulmonary hypertension, CAD,Hx ofCVA, MI who presented to the ED on 02/24/2018 with complaint of generalized malaise. He was found to be hypotensive requiring pressors and labs significant for severe hyperkalemia.  Assessment & Plan    Principal problem Severe hyperkalemia after missing HD, shock -Patient presented with generalized malaise, severe hyperkalemia, hypotensive, required vasopressors. -Currently stable, BP improved -Nephrology was consulted, patient was initially started on CRRT, then back on his schedule HD yesterday   ESRD on hemodialysis, MWF -Nephrology following, patient reports left aVF clotting issues.  Duplex ultrasound of left aVF revealed no thrombus, did show thickened walls.  Per patient had recent intervention in [place] 3 weeks ago for declotting aVF. -Discussed with nephrology, Dr. Moshe Cipro, plan for hemodialysis in a.m., if tolerated, DC home after HD.  Hypotension in the setting of essential hypertension history -BP currently stable, unclear etiology, no infectious source, cultures negative, and off antibiotics.  Paroxysmal atrial fibrillation Rate currently controlled. Per patient previously was on warfarin, until 11/2016 when he had subdural hematoma and warfarin was discontinued Patient will need to follow-up with Dr. Debara Pickett  and discuss resumption of anticoagulation   Code Status: Full code DVT Prophylaxis:   heparin  Family Communication: Discussed in detail with the patient, all imaging results, lab results explained to the patient    Disposition Plan: Likely DC home after HD in a.m.  Time Spent in minutes   25 minutes  Procedures:  CRRT and hemodialysis  Consultants:   Nephrology PCCM  Antimicrobials:   Anti-infectives (From admission, onward)   Start     Dose/Rate Route Frequency Ordered Stop   02/25/18 1800  vancomycin (VANCOCIN) IVPB 1000 mg/200 mL premix  Status:  Discontinued     1,000 mg 200 mL/hr over 60 Minutes Intravenous Every 24 hours 02/24/18 1631 02/25/18 0859   02/24/18 1700  piperacillin-tazobactam (ZOSYN) IVPB 3.375 g  Status:  Discontinued     3.375 g 100 mL/hr over 30 Minutes Intravenous Every 6 hours 02/24/18 1631 02/25/18 0859   02/24/18 1645  vancomycin (VANCOCIN) 2,000 mg in sodium chloride 0.9 % 500 mL IVPB     2,000 mg 250 mL/hr over 120 Minutes Intravenous  Once 02/24/18 1631 02/25/18 1900          Medications  Scheduled Meds: . allopurinol  100 mg Oral BID  . amitriptyline  50 mg Oral QHS  . carvedilol  12.5 mg Oral 2 times per day on Mon  Wed Fri  . carvedilol  25 mg Oral Once per day on Sun Tue Thu Sat  . Chlorhexidine Gluconate Cloth  6 each Topical Q0600  . Chlorhexidine Gluconate Cloth  6 each Topical Q0600  . darbepoetin (ARANESP) injection - DIALYSIS  60 mcg Intravenous Q Wed-HD  . gabapentin  200 mg Oral QHS  . heparin  5,000 Units Subcutaneous Q8H  . insulin aspart  0-5 Units Subcutaneous QHS  . insulin aspart  0-9 Units Subcutaneous TID WC  . simvastatin  20 mg Oral Daily   Continuous Infusions: . sodium chloride 10 mL/hr at 02/26/18 0400   PRN Meds:.diphenhydrAMINE      Subjective:   Daniel Whitney was seen and examined today.  No complaints. Patient denies dizziness, chest pain, shortness of breath, abdominal pain, N/V/D/C, new  weakness, numbess, tingling. No acute events overnight.  No fevers.  Objective:   Vitals:   02/27/18 0726 02/27/18 0840 02/27/18 0850 02/27/18 1020  BP:   119/68 (!) 108/58  Pulse:    96  Resp:   13 17  Temp: 98 F (36.7 C)     TempSrc: Oral     SpO2:  93%  100%  Weight:      Height:        Intake/Output Summary (Last 24 hours) at 02/27/2018 1123 Last data filed at 02/27/2018 0900 Gross per 24 hour  Intake 253.13 ml  Output 3000 ml  Net -2746.87 ml     Wt Readings from Last 3 Encounters:  02/27/18 111.3 kg  01/02/18 113.4 kg  04/04/17 114.4 kg     Exam  General: Alert and oriented x 3, NAD  Eyes:   HEENT:  Atraumatic, normocephalic  Cardiovascular: S1 S2 auscultated,  Regular rate and rhythm.  Respiratory: CTAB, no wheezing, rales or rhonchi  Gastrointestinal: Soft, nontender, nondistended, + bowel sounds  Ext: no pedal edema bilaterally, left forearm aVF  Neuro: No new deficits  Musculoskeletal: No digital cyanosis, clubbing  Skin: No rashes  Psych: Normal affect and demeanor, alert and oriented x3    Data Reviewed:  I have personally reviewed following labs and imaging studies  Micro Results Recent Results (from the past 240 hour(s))  MRSA PCR Screening     Status: None   Collection Time: 02/24/18  1:56 PM  Result Value Ref Range Status   MRSA by PCR NEGATIVE NEGATIVE Final    Comment:        The GeneXpert MRSA Assay (FDA approved for NASAL specimens only), is one component of a comprehensive MRSA colonization surveillance program. It is not intended to diagnose MRSA infection nor to guide or monitor treatment for MRSA infections. Performed at [facility], [place] [address], [place], [place] [address]   Culture, blood (routine x 2)     Status: None (Preliminary result)   Collection Time: 02/24/18  3:10 PM  Result Value Ref Range Status   Specimen Description BLOOD RIGHT FINGER  Final   Special Requests   Final    BOTTLES DRAWN  AEROBIC ONLY Blood Culture adequate volume   Culture   Final    NO GROWTH 3 DAYS Performed at [facility], [place] [address], [place], [place] [address]    Report Status PENDING  Incomplete  Culture, blood (routine x 2)     Status: None (Preliminary result)   Collection Time: 02/24/18  3:15 PM  Result Value Ref Range Status   Specimen Description BLOOD RIGHT UPPER ARM  Final  Special Requests   Final    BOTTLES DRAWN AEROBIC ONLY Blood Culture adequate volume   Culture   Final    NO GROWTH 3 DAYS Performed at [facility], [place] [address] [place], [place] [address]    Report Status PENDING  Incomplete    Radiology Reports Dg Chest Port 1 View  Result Date: 02/24/2018 CLINICAL DATA:  Central line placement. EXAM: PORTABLE CHEST 1 VIEW 2:44 p.m. COMPARISON:  02/24/2018 11:33 a.m. and 01/01/2018 FINDINGS: A double lumen central venous catheter is been inserted. The tip is in the region of the superior vena cava at approximately the level of the carina. No pneumothorax. Pulmonary vascularity is normal and the lungs are clear. Cardiac silhouette is within normal limits considering the AP portable technique. IMPRESSION: Central line tips in the superior vena cava approximately at the level of the carina as described. No acute abnormalities. Electronically Signed   By: Lorriane Shire M.D.   On: 02/24/2018 14:56   Dg Chest Port 1 View  Result Date: 02/24/2018 CLINICAL DATA:  Weakness. EXAM: PORTABLE CHEST 1 VIEW COMPARISON:  01/01/2018 and 04/03/2017 FINDINGS: Heart size and mediastinal contours are within normal limits considering the AP portable technique. Pulmonary vascularity is normal. No infiltrates or effusions. No acute bone abnormality. IMPRESSION: No active disease. Electronically Signed   By: Lorriane Shire M.D.   On: 02/24/2018 11:49   Vas US Duplex Dialysis Access (avf, Avg)  Result Date: 02/25/2018 DIALYSIS ACCESS Reason for Exam: Thrombus seen at an out of state  facility. Access Site: Left Upper Extremity. Access Type: Unable to identify multiple sites in the forearm. Performing Technologist: Toma Copier RVS Supporting Technologist: Oda Cogan RDMS, RVT  Examination Guidelines: A complete evaluation includes B-mode imaging, spectral Doppler, color Doppler, and power Doppler as needed of all accessible portions of each vessel. Unilateral testing is considered an integral part of a complete examination. Limited examinations for reoccurring indications may be performed as noted.  Findings:    Summary: Techncially difficult due to no information on location of placement of the AVF other than it is in the left forearm. Multiple possible sites. All areas able to be visualized appear patent with no obvious evidence of thrombus. However thickened walls are  noted. Other testing is suggested.  *See table(s) above for measurements and observations.  Diagnosing physician: Harold Barban MD Electronically signed by Harold Barban MD on 02/25/2018 at 6:29:50 PM.   --------------------------------------------------------------------------------   Final     Lab Data:  CBC: Recent Labs  Lab 02/24/18 1122 02/24/18 1156 02/25/18 0330 02/26/18 0654  WBC 8.8  --  7.3 4.8  NEUTROABS 7.5  --   --   --   HGB 10.0* 12.2* 9.2* 9.5*  HCT 33.6* 36.0* 28.4* 29.7*  MCV 108.4*  --  102.9* 105.7*  PLT 240  --  150 322*   Basic Metabolic Panel: Recent Labs  Lab 02/24/18 1258  02/24/18 2023 02/25/18 0330 02/25/18 1600 02/26/18 0242 02/27/18 0510  NA  --    < > 134*  133* 134* 136 132* 134*  K 7.4*   < > 4.9  4.8 5.1 3.9 4.3 3.9  CL  --    < > 96*  96* 97* 99 98 98  CO2  --    < > 21*  20* 25 28 25 27   GLUCOSE  --    < > 70  70 106* 166* 134* 141*  BUN  --    < >  53*  54* 40* 23 31* 19  CREATININE  --    < > 8.71*  8.80* 7.05* 4.29* 6.12* 5.25*  CALCIUM  --    < > 7.9*  7.8* 7.6* 7.5* 7.3* 9.0  MG 2.6*  --   --  2.4  --  2.5* 2.2  PHOS 8.5*   < > 5.6*  4.5 2.0* 3.6 3.9   < > = values in this interval not displayed.   GFR: Estimated Creatinine Clearance: 17.7 mL/min (A) (by C-G formula based on SCr of 5.25 mg/dL (H)). Liver Function Tests: Recent Labs  Lab 02/24/18 2023 02/25/18 0330 02/25/18 1600 02/26/18 0242 02/27/18 0510  ALBUMIN 3.3* 3.0* 2.9* 2.8* 2.8*   No results for input(s): LIPASE, AMYLASE in the last 168 hours. No results for input(s): AMMONIA in the last 168 hours. Coagulation Profile: Recent Labs  Lab 02/26/18 1950  INR 1.00   Cardiac Enzymes: No results for input(s): CKTOTAL, CKMB, CKMBINDEX, TROPONINI in the last 168 hours. BNP (last 3 results) No results for input(s): PROBNP in the last 8760 hours. HbA1C: No results for input(s): HGBA1C in the last 72 hours. CBG: Recent Labs  Lab 02/26/18 0735 02/26/18 1110 02/26/18 1732 02/26/18 1923 02/27/18 0725  GLUCAP 133* 140* 142* 166* 109*   Lipid Profile: No results for input(s): CHOL, HDL, LDLCALC, TRIG, CHOLHDL, LDLDIRECT in the last 72 hours. Thyroid Function Tests: No results for input(s): TSH, T4TOTAL, FREET4, T3FREE, THYROIDAB in the last 72 hours. Anemia Panel: No results for input(s): VITAMINB12, FOLATE, FERRITIN, TIBC, IRON, RETICCTPCT in the last 72 hours. Urine analysis:    Component Value Date/Time   COLORURINE YELLOW 01/17/2016 1047   APPEARANCEUR CLOUDY (A) 01/17/2016 1047   LABSPEC 1.016 01/17/2016 1047   PHURINE 5.0 01/17/2016 1047   GLUCOSEU NEGATIVE 01/17/2016 1047   GLUCOSEU NEGATIVE 12/30/2012 1103   HGBUR LARGE (A) 01/17/2016 1047   BILIRUBINUR SMALL (A) 01/17/2016 1047   KETONESUR NEGATIVE 01/17/2016 1047   PROTEINUR 100 (A) 01/17/2016 1047   UROBILINOGEN 1.0 08/25/2014 0641   NITRITE NEGATIVE 01/17/2016 1047   LEUKOCYTESUR LARGE (A) 01/17/2016 1047     Shaquilla Kehres M.D. [facility] 02/27/2018, 11:23 AM  Pager: [phone] Between 7am to 7pm - call Pager - [phone]  After 7pm go to [email or web address] - password  [email or web address]  Call night coverage person covering after 7pm

## 2018-02-27 NOTE — Progress Notes (Signed)
Subjective:  Some issues with clotting on HD yest but I think due to no heparin treatment as opposed to AVF issue-  Otherwise went well- removed 3000 - seems ready to go home  Objective Vital signs in last 24 hours: Vitals:   02/27/18 0320 02/27/18 0400 02/27/18 0500 02/27/18 0726  BP:  102/71    Pulse:  94 82   Resp:   (!) 24   Temp: 98.9 F (37.2 C)   98 F (36.7 C)  TempSrc: Oral   Oral  SpO2:  97% 97%   Weight:   111.3 kg   Height:       Weight change: 1.1 kg  Intake/Output Summary (Last 24 hours) at 02/27/2018 0924 Last data filed at 02/27/2018 0300 Gross per 24 hour  Intake 3.13 ml  Output 3000 ml  Net -2996.87 ml    [facility]/800EDW 113. HD Bath2 k, 3.5 ca, Dialyzer180 optiflux, Heparin3000 units bolus. AccessAVF.Last HD on 02/21/18, full treatment, left 2.7 kg above DW. mircera 60 q2weekly last dose on 02/09/18.  Assessment/ Plan: Pt is a 63 y.o. yo male ESRD who was admitted on 02/24/2018 with hypotension and hyperkalemia  Assessment/Plan: 1. Hypotension- resolved, uncertain etiology- cultures negative, off abx - plan per CCM was to resume Coreg- he is refusing- tells [place] that he stopped this at home long ago.  Under his EDW this AM -  will need to be lowered at discharge  2. ESRD - normally MWF at [place]- req temporary CRRT for low BP-  regular HD on schedule yesterday.  AVF determined to be patent- has had fairly recent intervention in [place] - worked fine yesterday- clotting was due to no heparin tx-  Pt prefers to stay and get his HD here tomorrow AM before discharge is considered 3. Anemia- redosed ESA here 4. Secondary hyperparathyroidism- phos controlled - no binder at present  5. Hyperkalemia- resolved, also unknown etiology- ruled out issue with access- went down to 3.9 with tx yesterday so access worked fine 6.  Dispo- I think fine for discharge tomorrow after HD if no other problems  Louis Meckel    Labs: Basic Metabolic Panel: Recent Labs  Lab 02/25/18 1600 02/26/18 0242 02/27/18 0510  NA 136 132* 134*  K 3.9 4.3 3.9  CL 99 98 98  CO2 28 25 27   GLUCOSE 166* 134* 141*  BUN 23 31* 19  CREATININE 4.29* 6.12* 5.25*  CALCIUM 7.5* 7.3* 9.0  PHOS 2.0* 3.6 3.9   Liver Function Tests: Recent Labs  Lab 02/25/18 1600 02/26/18 0242 02/27/18 0510  ALBUMIN 2.9* 2.8* 2.8*   No results for input(s): LIPASE, AMYLASE in the last 168 hours. No results for input(s): AMMONIA in the last 168 hours. CBC: Recent Labs  Lab 02/24/18 1122 02/24/18 1156 02/25/18 0330 02/26/18 0654  WBC 8.8  --  7.3 4.8  NEUTROABS 7.5  --   --   --   HGB 10.0* 12.2* 9.2* 9.5*  HCT 33.6* 36.0* 28.4* 29.7*  MCV 108.4*  --  102.9* 105.7*  PLT 240  --  150 141*   Cardiac Enzymes: No results for input(s): CKTOTAL, CKMB, CKMBINDEX, TROPONINI in the last 168 hours. CBG: Recent Labs  Lab 02/26/18 0355 02/26/18 0735 02/26/18 1110 02/26/18 1923 02/27/18 0725  GLUCAP 113* 133* 140* 166* 109*    Iron Studies: No results for input(s): IRON, TIBC, TRANSFERRIN, FERRITIN in the last 72 hours. Studies/Results: Vas US Duplex Dialysis  Access (avf, Avg)  Result Date: 02/25/2018 DIALYSIS ACCESS Reason for Exam: Thrombus seen at an out of state facility. Access Site: Left Upper Extremity. Access Type: Unable to identify multiple sites in the forearm. Performing Technologist: Toma Copier RVS Supporting Technologist: Oda Cogan RDMS, RVT  Examination Guidelines: A complete evaluation includes B-mode imaging, spectral Doppler, color Doppler, and power Doppler as needed of all accessible portions of each vessel. Unilateral testing is considered an integral part of a complete examination. Limited examinations for reoccurring indications may be performed as noted.  Findings:    Summary: Techncially difficult due to no information on location of placement of the AVF other than it is in the left  forearm. Multiple possible sites. All areas able to be visualized appear patent with no obvious evidence of thrombus. However thickened walls are  noted. Other testing is suggested.  *See table(s) above for measurements and observations.  Diagnosing physician: Harold Barban MD Electronically signed by Harold Barban MD on 02/25/2018 at 6:29:50 PM.   --------------------------------------------------------------------------------   Final    Medications: Infusions: . sodium chloride 10 mL/hr at 02/26/18 0400  . heparin 999 mL/hr at 02/24/18 1548    Scheduled Medications: . allopurinol  100 mg Oral BID  . amitriptyline  50 mg Oral QHS  . carvedilol  12.5 mg Oral 2 times per day on Mon Wed Fri  . carvedilol  25 mg Oral Once per day on Sun Tue Thu Sat  . Chlorhexidine Gluconate Cloth  6 each Topical Q0600  . darbepoetin (ARANESP) injection - DIALYSIS  60 mcg Intravenous Q Wed-HD  . gabapentin  200 mg Oral QHS  . heparin  5,000 Units Subcutaneous Q8H  . insulin aspart  0-5 Units Subcutaneous QHS  . insulin aspart  0-9 Units Subcutaneous TID WC  . simvastatin  20 mg Oral Daily    have reviewed scheduled and prn medications.  Physical Exam: General: sitting up at bedside, nad Heart: RRR Lungs: mostly clear Abdomen: soft,  nontender Extremities: no edema Dialysis Access: left forearm AVF- patent     02/27/2018,9:24 AM  LOS: 3 days

## 2018-02-28 ENCOUNTER — Other Ambulatory Visit: Payer: Self-pay

## 2018-02-28 DIAGNOSIS — I10 Essential (primary) hypertension: Secondary | ICD-10-CM

## 2018-02-28 LAB — CBC
HCT: 30 % — ABNORMAL LOW (ref 39.0–52.0)
Hemoglobin: 9.4 g/dL — ABNORMAL LOW (ref 13.0–17.0)
MCH: 32.3 pg (ref 26.0–34.0)
MCHC: 31.3 g/dL (ref 30.0–36.0)
MCV: 103.1 fL — ABNORMAL HIGH (ref 80.0–100.0)
Platelets: 150 10*3/uL (ref 150–400)
RBC: 2.91 MIL/uL — ABNORMAL LOW (ref 4.22–5.81)
RDW: 15.1 % (ref 11.5–15.5)
WBC: 6.7 10*3/uL (ref 4.0–10.5)
nRBC: 0 % (ref 0.0–0.2)

## 2018-02-28 LAB — HEPATITIS B SURFACE ANTIGEN: Hepatitis B Surface Ag: NEGATIVE

## 2018-02-28 LAB — GLUCOSE, CAPILLARY
Glucose-Capillary: 134 mg/dL — ABNORMAL HIGH (ref 70–99)
Glucose-Capillary: 97 mg/dL (ref 70–99)

## 2018-02-28 MED ORDER — HEPARIN SODIUM (PORCINE) 1000 UNIT/ML IJ SOLN
INTRAMUSCULAR | Status: AC
  Administered 2018-02-28: 3000 [IU] via INTRAVENOUS_CENTRAL
  Filled 2018-02-28: qty 3

## 2018-02-28 MED ORDER — CARVEDILOL 25 MG PO TABS: 25.0000 mg | ORAL_TABLET | ORAL | Status: DC

## 2018-02-28 MED ORDER — AMITRIPTYLINE HCL 50 MG PO TABS: 50.0000 mg | ORAL_TABLET | Freq: Every day | ORAL | 0 refills | Status: AC

## 2018-02-28 NOTE — Plan of Care (Addendum)
Nutrition Education Note  RD consulted for Renal Education. Provided "Low sodium Nutrition Therapy" handout from the Academy of [employer]. Reviewed food groups and provided written recommended serving sizes specifically determined for patient's current nutritional status.   Explained why diet restrictions are needed and provided lists of foods to limit/avoid that are high potassium, sodium, and phosphorus. Provided specific recommendations on safer alternatives of these foods. Strongly encouraged compliance of this diet. Discussed dietary recall with pt and determined that pt has been drinking too much fluid (2 alcoholic drinks, eggnog and 32 oz of water recently). Determined that pt had consumed an entire quart of eggnog this weekend which could have lead to his hyperkalemia and admittance to the hospital.   Discussed importance of protein intake at each meal and snack. Provided examples of how to maximize protein intake throughout the day. Discussed the possible need for fluid restriction with dialysis, importance of minimizing weight gain between HD treatments, and renal-friendly beverage options.  Encouraged pt to take a daily renal multivitamin.  Encouraged pt to discuss specific diet questions/concerns with RD at HD outpatient facility. Teach back method used.  Expect fair compliance.  Body mass index is 55.25 kg/m. Pt meets criteria for morbidly obese based on current BMI.  Current diet order is renal with fluid restrictions, patient is consuming approximately 100% of meals at this time. Labs and medications reviewed. No further nutrition interventions warranted at this time. RD contact information provided. If additional nutrition issues arise, please re-consult RD.  Mauricia Area, MS, Dietetic Intern Pager: [phone] After hours Pager: [phone]

## 2018-02-28 NOTE — Discharge Summary (Signed)
Physician Discharge Summary   Patient ID: Daniel Whitney MRN: [id number] DOB/AGE: 64-Sep-1957 64 y.o.  Admit date: 02/24/2018 Discharge date: 02/28/2018  Primary Care Physician:  [name], MD   Recommendations for Outpatient Follow-up:  1. Follow up with PCP in 1-2 weeks 2. Patient will continue next hemodialysis on Monday per his schedule 3. Strongly recommended to discuss with his PCP or cardiology regarding anticoagulation 4. Patient also refuses Coreg.  Recommended to discuss with his cardiologist, Dr. Debara Pickett  Home Health: None  Equipment/Devices:   Discharge Condition: stable CODE STATUS: FULL  Diet recommendation: Heart healthy diet   Discharge Diagnoses:    Severe hypotension likely secondary to circulatory shock Severe hyperkalemia after missing hemodialysis ESRD on hemodialysis, MWF Hypotension in the setting of history of hypertension Paroxysmal atrial fibrillation not on anticoagulation History of subdural hematoma in 2018  Consults: Nephrology    Allergies:   Allergies  Allergen Reactions  . Tobacco [Nicotiana Tabacum] Shortness Of Breath     DISCHARGE MEDICATIONS: Allergies as of 02/28/2018      Reactions   Tobacco [nicotiana Tabacum] Shortness Of Breath      Medication List    TAKE these medications   acetaminophen 650 MG CR tablet Commonly known as:  TYLENOL Take 650 mg by mouth every 8 (eight) hours as needed for pain.   allopurinol 100 MG tablet Commonly known as:  ZYLOPRIM Take 1 tablet (100 mg total) by mouth 2 (two) times daily.   amitriptyline 50 MG tablet Commonly known as:  ELAVIL Take 1 tablet (50 mg total) by mouth at bedtime. What changed:    how much to take  Another medication with the same name was removed. Continue taking this medication, and follow the directions you see here.   b complex-vitamin c-folic acid 0.8 MG Tabs tablet Take 1 tablet by mouth daily.   carvedilol 25 MG tablet Commonly known as:   COREG Take 1 tablet (25 mg total) by mouth See admin instructions. Give 12.5 tablet BID on M-W-fri, give 1 tablet qd on Tue-Thu-Sun-Sat. HOLD and please discuss with your cardiologist. What changed:  additional instructions   cinacalcet 30 MG tablet Commonly known as:  SENSIPAR Take 30 mg by mouth daily with supper.   clindamycin 1 % external solution Commonly known as:  CLEOCIN T Apply 1 application topically 2 (two) times daily. Apply to face.   docusate sodium 100 MG capsule Commonly known as:  COLACE Take 100 mg by mouth daily as needed for mild constipation.   gabapentin 100 MG capsule Commonly known as:  NEURONTIN Take 2 capsules (200 mg total) by mouth at bedtime. 200 mg qam and 300 mg upon return from dialysis on M-W-F for neuropathy What changed:    how much to take  when to take this  additional instructions   lactulose 10 GM/15ML solution Commonly known as:  CHRONULAC Take 20 g by mouth 2 (two) times daily as needed for mild constipation.   MUCINEX DM 30-600 MG Tb12 Take 1 tablet by mouth daily as needed (congestion).   nystatin cream Commonly known as:  MYCOSTATIN Apply 1 application topically 2 (two) times daily as needed (rash).   simvastatin 20 MG tablet Commonly known as:  ZOCOR Take 1 tablet (20 mg total) by mouth daily. What changed:  when to take this   sodium chloride 0.65 % Soln nasal spray Commonly known as:  OCEAN Place 1 spray into both nostrils as needed for congestion.   tretinoin 0.05 % cream  Commonly known as:  RETIN-A Apply 1 application topically at bedtime. Apply to face        Brief H and P: For complete details please refer to admission H and P, but in brief  63 year old African-American gentleman with end-stage renal disease on intermittent hemodialysis (M/W/F), atrial fibrillation, pulmonary hypertension, CAD,Hx ofCVA, MI who presented to the ED on 02/24/2018 with complaint of generalized malaise. He was found to be  hypotensive requiring pressors and labs significant for severe hyperkalemia.   Hospital Course:   Severe hyperkalemia after missing HD, shock -Patient presented with generalized malaise, severe hyperkalemia, hypotensive, required vasopressors.  BP has now improved off the vasopressors --Nephrology was consulted, patient was initially started on CRRT, then back on his MWF schedule HD on 02/26/2018 and today 1/17.   ESRD on hemodialysis, MWF -Nephrology following, patient reports left aVF clotting issues.  Duplex ultrasound of left aVF revealed no thrombus, did show thickened walls.  Per patient had recent intervention in [place] 3 weeks ago for declotting aVF. -Currently tolerating hemodialysis, cleared by nephrology to be discharged  Hypotension in the setting of essential hypertension history -BP currently stable, unclear etiology, no infectious source, cultures negative, and off antibiotics. -Patient refuses Coreg, states that it has dropped his blood pressure too low in the past Recommended to clarify with his cardiologist.  Paroxysmal atrial fibrillation Rate currently controlled. Per patient previously was on warfarin, until 11/2016 when he had subdural hematoma and warfarin was discontinued Patient will need to follow-up with Dr. Debara Pickett and discuss resumption of anticoagulation  Day of Discharge S: Doing well, tolerating hemodialysis  BP 133/69   Pulse 83   Temp 97.9 F (36.6 C) (Oral)   Resp 18   Ht 5\' 9"  (1.753 m)   Wt 113.9 kg   SpO2 96%   BMI 37.08 kg/m   Physical Exam: General: Alert and awake oriented x3 not in any acute distress. HEENT: anicteric sclera, pupils reactive to light and accommodation CVS: S1-S2 clear no murmur rubs or gallops Chest: clear to auscultation bilaterally, no wheezing rales or rhonchi Abdomen: soft nontender, nondistended, normal bowel sounds Extremities: no cyanosis, clubbing or edema noted bilaterally forearm aVF Neuro: Cranial  nerves II-XII intact, no focal neurological deficits   The results of significant diagnostics from this hospitalization (including imaging, microbiology, ancillary and laboratory) are listed below for reference.      Procedures/Studies:  Dg Chest Port 1 View  Result Date: 02/24/2018 CLINICAL DATA:  Central line placement. EXAM: PORTABLE CHEST 1 VIEW 2:44 p.m. COMPARISON:  02/24/2018 11:33 a.m. and 01/01/2018 FINDINGS: A double lumen central venous catheter is been inserted. The tip is in the region of the superior vena cava at approximately the level of the carina. No pneumothorax. Pulmonary vascularity is normal and the lungs are clear. Cardiac silhouette is within normal limits considering the AP portable technique. IMPRESSION: Central line tips in the superior vena cava approximately at the level of the carina as described. No acute abnormalities. Electronically Signed   By: Lorriane Shire M.D.   On: 02/24/2018 14:56   Dg Chest Port 1 View  Result Date: 02/24/2018 CLINICAL DATA:  Weakness. EXAM: PORTABLE CHEST 1 VIEW COMPARISON:  01/01/2018 and 04/03/2017 FINDINGS: Heart size and mediastinal contours are within normal limits considering the AP portable technique. Pulmonary vascularity is normal. No infiltrates or effusions. No acute bone abnormality. IMPRESSION: No active disease. Electronically Signed   By: Lorriane Shire M.D.   On: 02/24/2018 11:49   Vas  US Duplex Dialysis Access (avf, Avg)  Result Date: 02/25/2018 DIALYSIS ACCESS Reason for Exam: Thrombus seen at an out of state facility. Access Site: Left Upper Extremity. Access Type: Unable to identify multiple sites in the forearm. Performing Technologist: Toma Copier RVS Supporting Technologist: Oda Cogan RDMS, RVT  Examination Guidelines: A complete evaluation includes B-mode imaging, spectral Doppler, color Doppler, and power Doppler as needed of all accessible portions of each vessel. Unilateral testing is considered an  integral part of a complete examination. Limited examinations for reoccurring indications may be performed as noted.  Findings:    Summary: Techncially difficult due to no information on location of placement of the AVF other than it is in the left forearm. Multiple possible sites. All areas able to be visualized appear patent with no obvious evidence of thrombus. However thickened walls are  noted. Other testing is suggested.  *See table(s) above for measurements and observations.  Diagnosing physician: Harold Barban MD Electronically signed by Harold Barban MD on 02/25/2018 at 6:29:50 PM.   --------------------------------------------------------------------------------   Final        LAB RESULTS: Basic Metabolic Panel: Recent Labs  Lab 02/27/18 0510 02/28/18 0900  NA 134* 132*  K 3.9 4.0  CL 98 96*  CO2 27 21*  GLUCOSE 141* 141*  BUN 19 40*  CREATININE 5.25* 8.56*  CALCIUM 9.0 8.3*  MG 2.2  --   PHOS 3.9 3.7   Liver Function Tests: Recent Labs  Lab 02/27/18 0510 02/28/18 0900  ALBUMIN 2.8* 3.1*   No results for input(s): LIPASE, AMYLASE in the last 168 hours. No results for input(s): AMMONIA in the last 168 hours. CBC: Recent Labs  Lab 02/24/18 1122  02/26/18 0654 02/28/18 0900  WBC 8.8   < > 4.8 6.7  NEUTROABS 7.5  --   --   --   HGB 10.0*   < > 9.5* 9.4*  HCT 33.6*   < > 29.7* 30.0*  MCV 108.4*   < > 105.7* 103.1*  PLT 240   < > 141* 150   < > = values in this interval not displayed.   Cardiac Enzymes: No results for input(s): CKTOTAL, CKMB, CKMBINDEX, TROPONINI in the last 168 hours. BNP: Invalid input(s): POCBNP CBG: Recent Labs  Lab 02/27/18 2115 02/28/18 1120  GLUCAP 134* 134*      Disposition and Follow-up: Discharge Instructions    Diet - low sodium heart healthy   Complete by:  As directed    Increase activity slowly   Complete by:  As directed        DISPOSITION: Home after HD is completed   DISCHARGE FOLLOW-UP Follow-up  Information    Koirala, Dibas, MD. Schedule an appointment as soon as possible for a visit in 10 day(s).   Specialty:  Family Medicine Contact information: [address] [place] [address] [phone]        Pixie Casino, MD. Schedule an appointment as soon as possible for a visit in 2 week(s).   Specialty:  Cardiology Why:  please discuss about coumadin and coreg Contact information: [address] [place] [address] [phone]            Time coordinating discharge:  35 minutes Signed:   Estill Cotta M.D. [facility] 02/28/2018, 11:56 AM Pager: [phone]

## 2018-02-28 NOTE — Progress Notes (Signed)
Back back from Dialysis; alert and oriented, no acute distress noted, no complaints. VS stable. Will continue to monitor.

## 2018-02-28 NOTE — Progress Notes (Signed)
Patient requests to talk with his doctor. He has some questions before he is going home. MD made aware.

## 2018-02-28 NOTE — Progress Notes (Signed)
Subjective:  Seen on HD- wanting nursing to challenge for the weekend- plan is for him to go home today  Objective Vital signs in last 24 hours: Vitals:   02/28/18 0800 02/28/18 0812 02/28/18 0817 02/28/18 0830  BP: 137/79 (!) 141/90 126/69 116/77  Pulse: 91 86 88 96  Resp: 18     Temp: 97.9 F (36.6 C)     TempSrc: Oral     SpO2: 96%     Weight: 113.9 kg     Height:       Weight change:   Intake/Output Summary (Last 24 hours) at 02/28/2018 0900 Last data filed at 02/27/2018 1300 Gross per 24 hour  Intake 300 ml  Output -  Net 300 ml    [facility] 450/800EDW 113. HD Bath2 k, 3.5 ca, Dialyzer180 optiflux, Heparin3000 units bolus. AccessAVF.Last HD on 02/21/18, full treatment, left 2.7 kg above DW. mircera 60 q2weekly last dose on 02/09/18.  Assessment/ Plan: Pt is a 63 y.o. yo male ESRD who was admitted on 02/24/2018 with hypotension and hyperkalemia  Assessment/Plan: 1. Hypotension- resolved, uncertain etiology- cultures negative, off abx - plan per CCM was to resume Coreg- he is refusing- tells [place] that he stopped this at home long ago.  Under his EDW this AM -  will likely need to be lowered at discharge  2. ESRD - normally MWF at [place]- req temporary CRRT for low BP-  regular HD on schedule today .  AVF determined to be patent- has had fairly recent intervention in [place] - working fine here-  3. Anemia- redosed ESA here- hgb 9.5 4. Secondary hyperparathyroidism- phos controlled - no binder at present - I guess no pth treatment either 5. Hyperkalemia- resolved, also unknown etiology- ruled out issue with access- went down to 3.9 with HD  so access worked fine 6.  Dispo- I think fine for discharge today after HD if no other problems  Louis Meckel    Labs: Basic Metabolic Panel: Recent Labs  Lab 02/25/18 1600 02/26/18 0242 02/27/18 0510  NA 136 132* 134*  K 3.9 4.3 3.9  CL 99 98 98  CO2 28 25 27   GLUCOSE 166* 134* 141*   BUN 23 31* 19  CREATININE 4.29* 6.12* 5.25*  CALCIUM 7.5* 7.3* 9.0  PHOS 2.0* 3.6 3.9   Liver Function Tests: Recent Labs  Lab 02/25/18 1600 02/26/18 0242 02/27/18 0510  ALBUMIN 2.9* 2.8* 2.8*   No results for input(s): LIPASE, AMYLASE in the last 168 hours. No results for input(s): AMMONIA in the last 168 hours. CBC: Recent Labs  Lab 02/24/18 1122 02/24/18 1156 02/25/18 0330 02/26/18 0654  WBC 8.8  --  7.3 4.8  NEUTROABS 7.5  --   --   --   HGB 10.0* 12.2* 9.2* 9.5*  HCT 33.6* 36.0* 28.4* 29.7*  MCV 108.4*  --  102.9* 105.7*  PLT 240  --  150 141*   Cardiac Enzymes: No results for input(s): CKTOTAL, CKMB, CKMBINDEX, TROPONINI in the last 168 hours. CBG: Recent Labs  Lab 02/26/18 1923 02/27/18 0725 02/27/18 1123 02/27/18 1847 02/27/18 2115  GLUCAP 166* 109* 155* 170* 134*    Iron Studies: No results for input(s): IRON, TIBC, TRANSFERRIN, FERRITIN in the last 72 hours. Studies/Results: No results found. Medications: Infusions: . sodium chloride 10 mL/hr at 02/26/18 0400    Scheduled Medications: . allopurinol  100 mg Oral BID  . amitriptyline  50 mg Oral QHS  .  carvedilol  12.5 mg Oral 2 times per day on Mon Wed Fri  . carvedilol  25 mg Oral Once per day on Sun Tue Thu Sat  . Chlorhexidine Gluconate Cloth  6 each Topical Q0600  . Chlorhexidine Gluconate Cloth  6 each Topical Q0600  . darbepoetin (ARANESP) injection - DIALYSIS  60 mcg Intravenous Q Wed-HD  . gabapentin  200 mg Oral QHS  . heparin      . heparin  5,000 Units Subcutaneous Q8H  . insulin aspart  0-5 Units Subcutaneous QHS  . insulin aspart  0-9 Units Subcutaneous TID WC  . simvastatin  20 mg Oral Daily    have reviewed scheduled and prn medications.  Physical Exam: General: sitting up at bedside, nad Heart: RRR Lungs: mostly clear Abdomen: soft,  nontender Extremities: no edema Dialysis Access: left forearm AVF- patent     02/28/2018,9:00 AM  LOS: 4 days

## 2018-02-28 NOTE — Procedures (Signed)
Patient was seen on dialysis and the procedure was supervised.  BFR 400  Via AVF BP is  115/59.   Patient appears to be tolerating treatment well  Louis Meckel 02/28/2018

## 2018-02-28 NOTE — Progress Notes (Signed)
Patient was discharged home by MD order; discharged instructions  review and give to patient with care notes; AVF in place; skin intact; patient will be escorted to the car by nurse tech via wheelchair.

## 2018-03-01 LAB — RENAL FUNCTION PANEL
Albumin: 3.1 g/dL — ABNORMAL LOW (ref 3.5–5.0)
Anion gap: 15 (ref 5–15)
BUN: 40 mg/dL — ABNORMAL HIGH (ref 8–23)
CO2: 21 mmol/L — ABNORMAL LOW (ref 22–32)
CREATININE: 8.56 mg/dL — AB (ref 0.61–1.24)
Calcium: 8.3 mg/dL — ABNORMAL LOW (ref 8.9–10.3)
Chloride: 96 mmol/L — ABNORMAL LOW (ref 98–111)
GFR calc Af Amer: 7 mL/min — ABNORMAL LOW (ref >60)
GFR calc non Af Amer: 6 mL/min — ABNORMAL LOW (ref >60)
Glucose, Bld: 141 mg/dL — ABNORMAL HIGH (ref 70–99)
Phosphorus: 3.7 mg/dL (ref 2.5–4.6)
Potassium: 4 mmol/L (ref 3.5–5.1)
Sodium: 132 mmol/L — ABNORMAL LOW (ref 135–145)

## 2018-03-01 LAB — CULTURE, BLOOD (ROUTINE X 2)
CULTURE: NO GROWTH
Culture: NO GROWTH
Special Requests: ADEQUATE
Special Requests: ADEQUATE

## 2018-03-03 DIAGNOSIS — Z992 Dependence on renal dialysis: Secondary | ICD-10-CM | POA: Diagnosis not present

## 2018-03-03 DIAGNOSIS — N2581 Secondary hyperparathyroidism of renal origin: Secondary | ICD-10-CM | POA: Diagnosis not present

## 2018-03-03 DIAGNOSIS — D631 Anemia in chronic kidney disease: Secondary | ICD-10-CM | POA: Diagnosis not present

## 2018-03-03 DIAGNOSIS — E1129 Type 2 diabetes mellitus with other diabetic kidney complication: Secondary | ICD-10-CM | POA: Diagnosis not present

## 2018-03-03 DIAGNOSIS — D509 Iron deficiency anemia, unspecified: Secondary | ICD-10-CM | POA: Diagnosis not present

## 2018-03-03 DIAGNOSIS — N186 End stage renal disease: Secondary | ICD-10-CM | POA: Diagnosis not present

## 2018-03-04 DIAGNOSIS — N2581 Secondary hyperparathyroidism of renal origin: Secondary | ICD-10-CM | POA: Diagnosis not present

## 2018-03-04 DIAGNOSIS — E8770 Fluid overload, unspecified: Secondary | ICD-10-CM | POA: Diagnosis not present

## 2018-03-04 DIAGNOSIS — N186 End stage renal disease: Secondary | ICD-10-CM | POA: Diagnosis not present

## 2018-03-05 DIAGNOSIS — D631 Anemia in chronic kidney disease: Secondary | ICD-10-CM | POA: Diagnosis not present

## 2018-03-05 DIAGNOSIS — D509 Iron deficiency anemia, unspecified: Secondary | ICD-10-CM | POA: Diagnosis not present

## 2018-03-05 DIAGNOSIS — Z992 Dependence on renal dialysis: Secondary | ICD-10-CM | POA: Diagnosis not present

## 2018-03-05 DIAGNOSIS — N2581 Secondary hyperparathyroidism of renal origin: Secondary | ICD-10-CM | POA: Diagnosis not present

## 2018-03-05 DIAGNOSIS — E1129 Type 2 diabetes mellitus with other diabetic kidney complication: Secondary | ICD-10-CM | POA: Diagnosis not present

## 2018-03-05 DIAGNOSIS — N186 End stage renal disease: Secondary | ICD-10-CM | POA: Diagnosis not present

## 2018-03-07 DIAGNOSIS — E1143 Type 2 diabetes mellitus with diabetic autonomic (poly)neuropathy: Secondary | ICD-10-CM | POA: Diagnosis not present

## 2018-03-07 DIAGNOSIS — I5020 Unspecified systolic (congestive) heart failure: Secondary | ICD-10-CM | POA: Diagnosis not present

## 2018-03-07 DIAGNOSIS — D509 Iron deficiency anemia, unspecified: Secondary | ICD-10-CM | POA: Diagnosis not present

## 2018-03-07 DIAGNOSIS — Z8679 Personal history of other diseases of the circulatory system: Secondary | ICD-10-CM | POA: Diagnosis not present

## 2018-03-07 DIAGNOSIS — N186 End stage renal disease: Secondary | ICD-10-CM | POA: Diagnosis not present

## 2018-03-07 DIAGNOSIS — Z992 Dependence on renal dialysis: Secondary | ICD-10-CM | POA: Diagnosis not present

## 2018-03-07 DIAGNOSIS — D631 Anemia in chronic kidney disease: Secondary | ICD-10-CM | POA: Diagnosis not present

## 2018-03-07 DIAGNOSIS — I4891 Unspecified atrial fibrillation: Secondary | ICD-10-CM | POA: Diagnosis not present

## 2018-03-07 DIAGNOSIS — E1129 Type 2 diabetes mellitus with other diabetic kidney complication: Secondary | ICD-10-CM | POA: Diagnosis not present

## 2018-03-07 DIAGNOSIS — N2581 Secondary hyperparathyroidism of renal origin: Secondary | ICD-10-CM | POA: Diagnosis not present

## 2018-03-10 DIAGNOSIS — N186 End stage renal disease: Secondary | ICD-10-CM | POA: Diagnosis not present

## 2018-03-10 DIAGNOSIS — E1129 Type 2 diabetes mellitus with other diabetic kidney complication: Secondary | ICD-10-CM | POA: Diagnosis not present

## 2018-03-10 DIAGNOSIS — D631 Anemia in chronic kidney disease: Secondary | ICD-10-CM | POA: Diagnosis not present

## 2018-03-10 DIAGNOSIS — D509 Iron deficiency anemia, unspecified: Secondary | ICD-10-CM | POA: Diagnosis not present

## 2018-03-10 DIAGNOSIS — Z992 Dependence on renal dialysis: Secondary | ICD-10-CM | POA: Diagnosis not present

## 2018-03-10 DIAGNOSIS — N2581 Secondary hyperparathyroidism of renal origin: Secondary | ICD-10-CM | POA: Diagnosis not present

## 2018-03-12 DIAGNOSIS — D509 Iron deficiency anemia, unspecified: Secondary | ICD-10-CM | POA: Diagnosis not present

## 2018-03-12 DIAGNOSIS — E1129 Type 2 diabetes mellitus with other diabetic kidney complication: Secondary | ICD-10-CM | POA: Diagnosis not present

## 2018-03-12 DIAGNOSIS — Z992 Dependence on renal dialysis: Secondary | ICD-10-CM | POA: Diagnosis not present

## 2018-03-12 DIAGNOSIS — N186 End stage renal disease: Secondary | ICD-10-CM | POA: Diagnosis not present

## 2018-03-12 DIAGNOSIS — D631 Anemia in chronic kidney disease: Secondary | ICD-10-CM | POA: Diagnosis not present

## 2018-03-12 DIAGNOSIS — N2581 Secondary hyperparathyroidism of renal origin: Secondary | ICD-10-CM | POA: Diagnosis not present

## 2018-03-14 DIAGNOSIS — Z992 Dependence on renal dialysis: Secondary | ICD-10-CM | POA: Diagnosis not present

## 2018-03-14 DIAGNOSIS — N2581 Secondary hyperparathyroidism of renal origin: Secondary | ICD-10-CM | POA: Diagnosis not present

## 2018-03-14 DIAGNOSIS — D509 Iron deficiency anemia, unspecified: Secondary | ICD-10-CM | POA: Diagnosis not present

## 2018-03-14 DIAGNOSIS — N186 End stage renal disease: Secondary | ICD-10-CM | POA: Diagnosis not present

## 2018-03-14 DIAGNOSIS — E1129 Type 2 diabetes mellitus with other diabetic kidney complication: Secondary | ICD-10-CM | POA: Diagnosis not present

## 2018-03-14 DIAGNOSIS — D631 Anemia in chronic kidney disease: Secondary | ICD-10-CM | POA: Diagnosis not present

## 2018-03-15 DIAGNOSIS — T82858A Stenosis of vascular prosthetic devices, implants and grafts, initial encounter: Secondary | ICD-10-CM | POA: Diagnosis not present

## 2018-03-15 DIAGNOSIS — I871 Compression of vein: Secondary | ICD-10-CM | POA: Diagnosis not present

## 2018-03-15 DIAGNOSIS — I120 Hypertensive chronic kidney disease with stage 5 chronic kidney disease or end stage renal disease: Secondary | ICD-10-CM | POA: Diagnosis not present

## 2018-03-15 DIAGNOSIS — Z992 Dependence on renal dialysis: Secondary | ICD-10-CM | POA: Diagnosis not present

## 2018-03-15 DIAGNOSIS — N186 End stage renal disease: Secondary | ICD-10-CM | POA: Diagnosis not present

## 2018-03-17 ENCOUNTER — Encounter: Payer: Self-pay | Admitting: Internal Medicine

## 2018-03-17 ENCOUNTER — Ambulatory Visit (INDEPENDENT_AMBULATORY_CARE_PROVIDER_SITE_OTHER): Payer: Medicare Other | Admitting: Internal Medicine

## 2018-03-17 VITALS — BP 117/68 | HR 63 | Ht 69.0 in | Wt 260.2 lb

## 2018-03-17 DIAGNOSIS — I4819 Other persistent atrial fibrillation: Secondary | ICD-10-CM

## 2018-03-17 DIAGNOSIS — Z7901 Long term (current) use of anticoagulants: Secondary | ICD-10-CM | POA: Diagnosis not present

## 2018-03-17 DIAGNOSIS — D631 Anemia in chronic kidney disease: Secondary | ICD-10-CM | POA: Diagnosis not present

## 2018-03-17 DIAGNOSIS — I10 Essential (primary) hypertension: Secondary | ICD-10-CM | POA: Diagnosis not present

## 2018-03-17 DIAGNOSIS — N2581 Secondary hyperparathyroidism of renal origin: Secondary | ICD-10-CM | POA: Diagnosis not present

## 2018-03-17 DIAGNOSIS — N186 End stage renal disease: Secondary | ICD-10-CM

## 2018-03-17 DIAGNOSIS — D509 Iron deficiency anemia, unspecified: Secondary | ICD-10-CM | POA: Diagnosis not present

## 2018-03-17 DIAGNOSIS — Z992 Dependence on renal dialysis: Secondary | ICD-10-CM | POA: Diagnosis not present

## 2018-03-17 DIAGNOSIS — E1129 Type 2 diabetes mellitus with other diabetic kidney complication: Secondary | ICD-10-CM | POA: Diagnosis not present

## 2018-03-17 NOTE — Patient Instructions (Signed)
Medication Instructions:  Continue current medications If you need a refill on your cardiac medications before your next appointment, please call your pharmacy.   Follow-Up: At [facility], you and your health needs are our priority.  As part of our continuing mission to provide you with exceptional heart care, we have created designated Provider Care Teams.  These Care Teams include your primary Cardiologist (physician) and Advanced Practice Providers (APPs -  Physician Assistants and Nurse Practitioners) who all work together to provide you with the care you need, when you need it. You will need a follow up appointment in 12 months.  Please call our office 2 months in advance to schedule this appointment.  You may see Pixie Casino, MD or one of the following Advanced Practice Providers on your designated Care Team: [place], [place] . Fabian Sharp, PA-C  Any Other Special Instructions Will Be Listed Below (If Applicable).

## 2018-03-17 NOTE — Progress Notes (Signed)
OFFICE NOTE  Chief Complaint:  Routine follow-up  Primary Care Physician: [name], MD  HPI:  Daniel Whitney is a very pleasant 63 year old male who is retired and EMS with the [employer]. He was last assigned to work in the [place]. He does have family members in [place] and after retirement moved to this area to be closer to his family. Unfortunately, he has a significant past medical history including heart failure, with a cardiac catheterization in the past that apparently showed some coronary artery disease, but he denies ever having an MI or prior stenting. He also has a history of hypertension, dyslipidemia, end-stage renal disease and has been on dialysis for 2 years, and atrial fibrillation on warfarin. He has had a prior stroke and has diabetes, but is not on insulin. He is also morbidly obese. His Coumadin levels are now being followed by his primary care provider Dr. Jenny Reichmann, and he reports his last INR was 2.1.  With regards to atrial fibrillation he says these had it for approximately 2-3 years.  He initially underwent a TEE cardioversion which converted him to sinus for short period of time and then ultimately went back into atrial fibrillation. I believe he was started on amiodarone at this time and underwent a subsequent cardioversion which was not successful.  He has been maintained on amiodarone, but believes that he has been in atrial fibrillation for some time. He reported to me that he may have felt a little more energy when he was in sinus rhythm but it is difficult for him to tell. He is currently denying any chest pain or worsening shortness of breath. He reports his weight varies somewhat from week to week and is generally managed at dialysis. He is aneuric. The degree of his cardiomyopathy is unknown at this time and we will request records from his prior cardiologist (Dr. Royanne Foots) and electrophysiologist (Dr. Leonia Reeves) in the  [place].  07/28/2015  Daniel Whitney returns today for follow-up. I last saw him in the office as a new patient in 2015. Subsequently he presented in May 2016 after missing 2 dialysis sessions with hyperkalemia and was noted to present in cardiac arrest. He was successfully resuscitated. He again presented with PA arrest in November 2016 and was hyperkalemic with a potassium of 7. At the time he was noted to have an EF of 20% however cardiac catheterization revealed mild distal RCA disease but no obstructive coronary disease. 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 Based on presentation and improvement in LV function he was not a candidate for AICD. He was placed on amiodarone for rate control and this is been weaned down to 200 mg daily. He also is on carvedilol but says it is associated with hypotension at dialysis. It seems that he has intermittent atrial fibrillation or flutter and there are clearly documented periods of sinus rhythm but he is asymptomatic with this. He is on warfarin for anticoagulation. He recently was dismissed by his primary care provider (Dr. Jenny Reichmann) but reestablished primary care with Dr. Dorthy Cooler in the [place] practice. At this time, is not clear who is following his INRs however he says that he has had them drawn at dialysis in the past. We will try to arrange to follow those in our office.  06/14/2016  Daniel Whitney returns today for follow-up. He seen in preoperative evaluation for upcoming knee surgery. In December he underwent right knee replacement and  required bridging with Lovenox due to his high CHADSVASC score. Surgery was unremarkable and he again is planning on surgery in a few weeks to the left knee. We planned the similar strategy of holding warfarin 5 days prior to surgery and using Lovenox for bridging. Daniel Whitney should be at low risk for surgery as catheterization indicated no significant coronary disease recently. He  denies chest pain, worsening shortness of breath or associated symptoms. He's been compliant with dialysis. Weight is up slightly 3 pounds since I last saw him. Blood pressure was low normal today but he is asymptomatic with that.  03/17/2018  Daniel Whitney is seen today in follow-up.  Recently he was hospitalized for hyperkalemia and hypotension in the setting of inability to dialyze him due to apparently an occluded fistula.  Despite his metabolic derangements he is doing fairly well.  He has an appointment coming up soon with Dr. Carlis Abbott at [place].  He was also hospitalized and apparently treated up in [place].  At some point he had a subdural hematoma as well, and has been taken off of warfarin.  He says he is seeing a neurosurgeon who will determine whether or not he can go back on warfarin.  He currently denies chest pain or worsening shortness of breath.  PMHx:  Past Medical History:  Diagnosis Date  . Allergic rhinitis 07/07/2014  . Anemia due to other cause 07/07/2014  . Arthritis    "all over"  . Atrial fibrillation ([place]) 12/30/2012  . CHF (congestive heart failure) ([place]) 12/30/2012  . Chronic lower back pain   . Diabetes mellitus with neuropathy ([place])   . Diabetic retinopathy ([place]) 12/30/2012   no medications  . Dysrhythmia    Afib  . ESRD (end stage renal disease) on dialysis ([place]) started in 2013   MWF; [facility]; [facility]  . GERD (gastroesophageal reflux disease)   . Glaucoma   . History of gout    "right big toe"  . Hyperlipidemia   . Hypertension   . Hypothyroid    01/17/16- no longer on meduication  . Morbid obesity ([place]) 12/30/2012  . Myocardial infarction ([place])    "I've had ~ 3; last one was in ~ 10/2013" (01/30/2014)  . OSA (obstructive sleep apnea) 12/30/2012   "lost weight; no longer needed CPAP; retested said I needed it; didn't followup cause I was feeling fine" (01/30/2014)  . Pulmonary hypertension ([place]) 12/30/2012  . Refusal of blood transfusions as patient is  Jehovah's Witness   . Stroke [facility]) ~ 2005; ~ 2005   "they were mild; I didn't even notice I'd had them"; denies residual on 01/30/2014  . Subdural hematoma (Oct 2018)    Coumadin and heparin placed on hold at dc- pt did not FU w/ NS as recommended after dc so not on warfarin when admitted 04/03/17    Past Surgical History:  Procedure Laterality Date  . AV FISTULA PLACEMENT Left ~ 10/2013   "forearm"  . CARDIAC CATHETERIZATION N/A 01/07/2015   Procedure: Left Heart Cath and Coronary Angiography;  Surgeon: Troy Sine, MD;  Location: [facility];  Service: Cardiovascular;  Laterality: N/A;  . CLOSED REDUCTION HIP DISLOCATION Right 1970's  . JOINT REPLACEMENT    . TOTAL HIP ARTHROPLASTY Right 1980  . TOTAL KNEE ARTHROPLASTY Right 01/24/2016   Procedure: TOTAL KNEE ARTHROPLASTY;  Surgeon: Dorna Leitz, MD;  Location: [place];  Service: Orthopedics;  Laterality: Right;  . TOTAL KNEE ARTHROPLASTY Left 06/26/2016   Procedure: TOTAL  KNEE ARTHROPLASTY;  Surgeon: Dorna Leitz, MD;  Location: [place];  Service: Orthopedics;  Laterality: Left;    FAMHx:  Family History  Problem Relation Age of Onset  . Arthritis Other   . Heart disease Other   . Hyperlipidemia Other   . Hypertension Other   . Kidney disease Other   . Diabetes Other   . Stroke Other   . Diabetes Mother   . Cancer Father        bone cancer  . Heart disease Father   . Diabetes Sister   . Diabetes Brother   . Stroke Brother     SOCHx:   reports that he has never smoked. He has never used smokeless tobacco. He reports current alcohol use of about 7.0 - 8.0 standard drinks of alcohol per week. He reports that he does not use drugs.  ALLERGIES:  Allergies  Allergen Reactions  . Tobacco [Nicotiana Tabacum] Shortness Of Breath    ROS: Pertinent items noted in HPI and remainder of comprehensive ROS otherwise negative.  HOME MEDS: Current Outpatient Medications  Medication Sig Dispense Refill  . acetaminophen  (TYLENOL) 650 MG CR tablet Take 650 mg by mouth every 8 (eight) hours as needed for pain.    Marland Kitchen allopurinol (ZYLOPRIM) 100 MG tablet Take 1 tablet (100 mg total) by mouth 2 (two) times daily. 60 tablet 0  . amitriptyline (ELAVIL) 50 MG tablet Take 1 tablet (50 mg total) by mouth at bedtime. 30 tablet 0  . b complex-vitamin c-folic acid (NEPHRO-VITE) 0.8 MG TABS tablet Take 1 tablet by mouth daily.    . cinacalcet (SENSIPAR) 30 MG tablet Take 30 mg by mouth daily with supper.  6  . clindamycin (CLEOCIN T) 1 % external solution Apply 1 application topically 2 (two) times daily. Apply to face.    Marland Kitchen Dextromethorphan-Guaifenesin (MUCINEX DM) 30-600 MG TB12 Take 1 tablet by mouth daily as needed (congestion).     Marland Kitchen docusate sodium (COLACE) 100 MG capsule Take 100 mg by mouth daily as needed for mild constipation.     . gabapentin (NEURONTIN) 100 MG capsule Take 2 capsules (200 mg total) by mouth at bedtime. 200 mg qam and 300 mg upon return from dialysis on M-W-F for neuropathy (Patient taking differently: Take 100-200 mg by mouth See admin instructions. 100mg  in the morning and 200mg  in the evening) 60 capsule 0  . lactulose (CHRONULAC) 10 GM/15ML solution Take 20 g by mouth 2 (two) times daily as needed for mild constipation.    Marland Kitchen nystatin cream (MYCOSTATIN) Apply 1 application topically 2 (two) times daily as needed (rash).    . simvastatin (ZOCOR) 20 MG tablet Take 1 tablet (20 mg total) by mouth daily. (Patient taking differently: Take 20 mg by mouth at bedtime. ) 30 tablet 0  . sodium chloride (OCEAN) 0.65 % SOLN nasal spray Place 1 spray into both nostrils as needed for congestion. 104 mL 0  . tretinoin (RETIN-A) 0.05 % cream Apply 1 application topically at bedtime. Apply to face     No current facility-administered medications for this visit.     LABS/IMAGING: No results found for this or any previous visit (from the past 48 hour(s)). No results found.  VITALS: BP 117/68   Pulse 63   Ht 5'  9" (1.753 m)   Wt 260 lb 3.2 oz (118 kg)   BMI 38.42 kg/m   EXAM: General appearance: alert, no distress and morbidly obese Neck: no carotid bruit, no JVD  and thick Lungs: diminished breath sounds bibasilar Heart: irregularly irregular rhythm Abdomen: soft, non-tender; bowel sounds normal; no masses,  no organomegaly and morbidly obese Extremities: extremities normal, atraumatic, no cyanosis or edema Pulses: 2+ and symmetric Fistula in the left forearm - some surrounding hematoma, faint +thrill Skin: Skin color, texture, turgor normal. No rashes or lesions Neurologic: Grossly normal Psych: Normal mood, affect  EKG: Deferred  ASSESSMENT: 1. History of congestive heart failure, LVEF improved to 55-60% in 2016 2. End-stage renal disease on dialysis 3. Persistent atrial fibrillation - CHADSVASC score of 4, currently off of warfarin due to subdural hematoma 4. Hypertension 5. Dyslipidemia - on simvastatin (although evidence does not support statins in ESRD) 6. Morbid obesity 7. Pulmonary hypertension 8. DM2 9. Status post cardiac arrest 2 associated with missed dialysis - essentially normal arteries by cath in 12/2014  PLAN: 1.   Daniel Whitney continues to struggle with dialysis access and is resulted in hospitalizations.  He has a persistent A. fib and heart rate in the 60s today.  He is on warfarin however recently had a subdural hematoma and was discontinued.  He has a follow-up with surgery who would be the ones to possibly clear him to go back on warfarin or determined that he may need to stay off of that indefinitely.  Plan follow-up with me annually or sooner as necessary.  Pixie Casino, MD, [facility], [place] Director of the Advanced Lipid Disorders &  Cardiovascular Risk Reduction Clinic Diplomate of the American Board of Clinical Lipidology Attending Cardiologist  Direct Dial: [phone]  Fax: [phone]  Website:   [email or web address] 03/17/2018, 3:49 PM

## 2018-03-18 ENCOUNTER — Encounter: Payer: Self-pay | Admitting: Internal Medicine

## 2018-03-18 ENCOUNTER — Encounter: Payer: Medicare Other | Admitting: Vascular Surgery

## 2018-03-18 DIAGNOSIS — N5201 Erectile dysfunction due to arterial insufficiency: Secondary | ICD-10-CM | POA: Diagnosis not present

## 2018-03-18 DIAGNOSIS — Z992 Dependence on renal dialysis: Secondary | ICD-10-CM | POA: Diagnosis not present

## 2018-03-18 DIAGNOSIS — N186 End stage renal disease: Secondary | ICD-10-CM | POA: Diagnosis not present

## 2018-03-18 DIAGNOSIS — T82858A Stenosis of vascular prosthetic devices, implants and grafts, initial encounter: Secondary | ICD-10-CM | POA: Diagnosis not present

## 2018-03-18 DIAGNOSIS — I871 Compression of vein: Secondary | ICD-10-CM | POA: Diagnosis not present

## 2018-03-19 DIAGNOSIS — D509 Iron deficiency anemia, unspecified: Secondary | ICD-10-CM | POA: Diagnosis not present

## 2018-03-19 DIAGNOSIS — N2581 Secondary hyperparathyroidism of renal origin: Secondary | ICD-10-CM | POA: Diagnosis not present

## 2018-03-19 DIAGNOSIS — Z992 Dependence on renal dialysis: Secondary | ICD-10-CM | POA: Diagnosis not present

## 2018-03-19 DIAGNOSIS — N186 End stage renal disease: Secondary | ICD-10-CM | POA: Diagnosis not present

## 2018-03-19 DIAGNOSIS — D631 Anemia in chronic kidney disease: Secondary | ICD-10-CM | POA: Diagnosis not present

## 2018-03-19 DIAGNOSIS — E1129 Type 2 diabetes mellitus with other diabetic kidney complication: Secondary | ICD-10-CM | POA: Diagnosis not present

## 2018-03-21 DIAGNOSIS — N186 End stage renal disease: Secondary | ICD-10-CM | POA: Diagnosis not present

## 2018-03-21 DIAGNOSIS — Z992 Dependence on renal dialysis: Secondary | ICD-10-CM | POA: Diagnosis not present

## 2018-03-21 DIAGNOSIS — E1129 Type 2 diabetes mellitus with other diabetic kidney complication: Secondary | ICD-10-CM | POA: Diagnosis not present

## 2018-03-21 DIAGNOSIS — D509 Iron deficiency anemia, unspecified: Secondary | ICD-10-CM | POA: Diagnosis not present

## 2018-03-21 DIAGNOSIS — D631 Anemia in chronic kidney disease: Secondary | ICD-10-CM | POA: Diagnosis not present

## 2018-03-21 DIAGNOSIS — N2581 Secondary hyperparathyroidism of renal origin: Secondary | ICD-10-CM | POA: Diagnosis not present

## 2018-03-24 DIAGNOSIS — Z992 Dependence on renal dialysis: Secondary | ICD-10-CM | POA: Diagnosis not present

## 2018-03-24 DIAGNOSIS — N186 End stage renal disease: Secondary | ICD-10-CM | POA: Diagnosis not present

## 2018-03-24 DIAGNOSIS — E1129 Type 2 diabetes mellitus with other diabetic kidney complication: Secondary | ICD-10-CM | POA: Diagnosis not present

## 2018-03-24 DIAGNOSIS — D631 Anemia in chronic kidney disease: Secondary | ICD-10-CM | POA: Diagnosis not present

## 2018-03-24 DIAGNOSIS — N2581 Secondary hyperparathyroidism of renal origin: Secondary | ICD-10-CM | POA: Diagnosis not present

## 2018-03-24 DIAGNOSIS — D509 Iron deficiency anemia, unspecified: Secondary | ICD-10-CM | POA: Diagnosis not present

## 2018-03-26 DIAGNOSIS — D631 Anemia in chronic kidney disease: Secondary | ICD-10-CM | POA: Diagnosis not present

## 2018-03-26 DIAGNOSIS — D509 Iron deficiency anemia, unspecified: Secondary | ICD-10-CM | POA: Diagnosis not present

## 2018-03-26 DIAGNOSIS — N186 End stage renal disease: Secondary | ICD-10-CM | POA: Diagnosis not present

## 2018-03-26 DIAGNOSIS — N2581 Secondary hyperparathyroidism of renal origin: Secondary | ICD-10-CM | POA: Diagnosis not present

## 2018-03-26 DIAGNOSIS — E1129 Type 2 diabetes mellitus with other diabetic kidney complication: Secondary | ICD-10-CM | POA: Diagnosis not present

## 2018-03-26 DIAGNOSIS — Z992 Dependence on renal dialysis: Secondary | ICD-10-CM | POA: Diagnosis not present

## 2018-03-28 DIAGNOSIS — D631 Anemia in chronic kidney disease: Secondary | ICD-10-CM | POA: Diagnosis not present

## 2018-03-28 DIAGNOSIS — E1129 Type 2 diabetes mellitus with other diabetic kidney complication: Secondary | ICD-10-CM | POA: Diagnosis not present

## 2018-03-28 DIAGNOSIS — D509 Iron deficiency anemia, unspecified: Secondary | ICD-10-CM | POA: Diagnosis not present

## 2018-03-28 DIAGNOSIS — Z992 Dependence on renal dialysis: Secondary | ICD-10-CM | POA: Diagnosis not present

## 2018-03-28 DIAGNOSIS — N186 End stage renal disease: Secondary | ICD-10-CM | POA: Diagnosis not present

## 2018-03-28 DIAGNOSIS — N2581 Secondary hyperparathyroidism of renal origin: Secondary | ICD-10-CM | POA: Diagnosis not present

## 2018-03-31 DIAGNOSIS — D631 Anemia in chronic kidney disease: Secondary | ICD-10-CM | POA: Diagnosis not present

## 2018-03-31 DIAGNOSIS — N186 End stage renal disease: Secondary | ICD-10-CM | POA: Diagnosis not present

## 2018-03-31 DIAGNOSIS — N2581 Secondary hyperparathyroidism of renal origin: Secondary | ICD-10-CM | POA: Diagnosis not present

## 2018-03-31 DIAGNOSIS — E1129 Type 2 diabetes mellitus with other diabetic kidney complication: Secondary | ICD-10-CM | POA: Diagnosis not present

## 2018-03-31 DIAGNOSIS — D509 Iron deficiency anemia, unspecified: Secondary | ICD-10-CM | POA: Diagnosis not present

## 2018-03-31 DIAGNOSIS — Z992 Dependence on renal dialysis: Secondary | ICD-10-CM | POA: Diagnosis not present

## 2018-04-02 DIAGNOSIS — N186 End stage renal disease: Secondary | ICD-10-CM | POA: Diagnosis not present

## 2018-04-02 DIAGNOSIS — N2581 Secondary hyperparathyroidism of renal origin: Secondary | ICD-10-CM | POA: Diagnosis not present

## 2018-04-02 DIAGNOSIS — Z992 Dependence on renal dialysis: Secondary | ICD-10-CM | POA: Diagnosis not present

## 2018-04-02 DIAGNOSIS — E1129 Type 2 diabetes mellitus with other diabetic kidney complication: Secondary | ICD-10-CM | POA: Diagnosis not present

## 2018-04-02 DIAGNOSIS — D509 Iron deficiency anemia, unspecified: Secondary | ICD-10-CM | POA: Diagnosis not present

## 2018-04-02 DIAGNOSIS — D631 Anemia in chronic kidney disease: Secondary | ICD-10-CM | POA: Diagnosis not present

## 2018-04-04 DIAGNOSIS — D631 Anemia in chronic kidney disease: Secondary | ICD-10-CM | POA: Diagnosis not present

## 2018-04-04 DIAGNOSIS — Z992 Dependence on renal dialysis: Secondary | ICD-10-CM | POA: Diagnosis not present

## 2018-04-04 DIAGNOSIS — N186 End stage renal disease: Secondary | ICD-10-CM | POA: Diagnosis not present

## 2018-04-04 DIAGNOSIS — N2581 Secondary hyperparathyroidism of renal origin: Secondary | ICD-10-CM | POA: Diagnosis not present

## 2018-04-04 DIAGNOSIS — E1129 Type 2 diabetes mellitus with other diabetic kidney complication: Secondary | ICD-10-CM | POA: Diagnosis not present

## 2018-04-04 DIAGNOSIS — D509 Iron deficiency anemia, unspecified: Secondary | ICD-10-CM | POA: Diagnosis not present

## 2018-04-07 DIAGNOSIS — D509 Iron deficiency anemia, unspecified: Secondary | ICD-10-CM | POA: Diagnosis not present

## 2018-04-07 DIAGNOSIS — D631 Anemia in chronic kidney disease: Secondary | ICD-10-CM | POA: Diagnosis not present

## 2018-04-07 DIAGNOSIS — N186 End stage renal disease: Secondary | ICD-10-CM | POA: Diagnosis not present

## 2018-04-07 DIAGNOSIS — N2581 Secondary hyperparathyroidism of renal origin: Secondary | ICD-10-CM | POA: Diagnosis not present

## 2018-04-07 DIAGNOSIS — Z992 Dependence on renal dialysis: Secondary | ICD-10-CM | POA: Diagnosis not present

## 2018-04-07 DIAGNOSIS — E1129 Type 2 diabetes mellitus with other diabetic kidney complication: Secondary | ICD-10-CM | POA: Diagnosis not present

## 2018-04-09 DIAGNOSIS — Z992 Dependence on renal dialysis: Secondary | ICD-10-CM | POA: Diagnosis not present

## 2018-04-09 DIAGNOSIS — N186 End stage renal disease: Secondary | ICD-10-CM | POA: Diagnosis not present

## 2018-04-09 DIAGNOSIS — D631 Anemia in chronic kidney disease: Secondary | ICD-10-CM | POA: Diagnosis not present

## 2018-04-09 DIAGNOSIS — E1129 Type 2 diabetes mellitus with other diabetic kidney complication: Secondary | ICD-10-CM | POA: Diagnosis not present

## 2018-04-09 DIAGNOSIS — D509 Iron deficiency anemia, unspecified: Secondary | ICD-10-CM | POA: Diagnosis not present

## 2018-04-09 DIAGNOSIS — N2581 Secondary hyperparathyroidism of renal origin: Secondary | ICD-10-CM | POA: Diagnosis not present

## 2018-04-12 DIAGNOSIS — Z992 Dependence on renal dialysis: Secondary | ICD-10-CM | POA: Diagnosis not present

## 2018-04-12 DIAGNOSIS — E1129 Type 2 diabetes mellitus with other diabetic kidney complication: Secondary | ICD-10-CM | POA: Diagnosis not present

## 2018-04-12 DIAGNOSIS — D631 Anemia in chronic kidney disease: Secondary | ICD-10-CM | POA: Diagnosis not present

## 2018-04-12 DIAGNOSIS — N186 End stage renal disease: Secondary | ICD-10-CM | POA: Diagnosis not present

## 2018-04-12 DIAGNOSIS — D509 Iron deficiency anemia, unspecified: Secondary | ICD-10-CM | POA: Diagnosis not present

## 2018-04-12 DIAGNOSIS — N2581 Secondary hyperparathyroidism of renal origin: Secondary | ICD-10-CM | POA: Diagnosis not present

## 2018-04-13 DIAGNOSIS — Z992 Dependence on renal dialysis: Secondary | ICD-10-CM | POA: Diagnosis not present

## 2018-04-13 DIAGNOSIS — N186 End stage renal disease: Secondary | ICD-10-CM | POA: Diagnosis not present

## 2018-04-13 DIAGNOSIS — I120 Hypertensive chronic kidney disease with stage 5 chronic kidney disease or end stage renal disease: Secondary | ICD-10-CM | POA: Diagnosis not present

## 2018-04-15 ENCOUNTER — Encounter: Payer: Medicare Other | Admitting: Vascular Surgery

## 2018-04-16 DIAGNOSIS — E1129 Type 2 diabetes mellitus with other diabetic kidney complication: Secondary | ICD-10-CM | POA: Diagnosis not present

## 2018-04-16 DIAGNOSIS — N2581 Secondary hyperparathyroidism of renal origin: Secondary | ICD-10-CM | POA: Diagnosis not present

## 2018-04-16 DIAGNOSIS — Z992 Dependence on renal dialysis: Secondary | ICD-10-CM | POA: Diagnosis not present

## 2018-04-16 DIAGNOSIS — N186 End stage renal disease: Secondary | ICD-10-CM | POA: Diagnosis not present

## 2018-04-18 DIAGNOSIS — N2581 Secondary hyperparathyroidism of renal origin: Secondary | ICD-10-CM | POA: Diagnosis not present

## 2018-04-18 DIAGNOSIS — N186 End stage renal disease: Secondary | ICD-10-CM | POA: Diagnosis not present

## 2018-04-18 DIAGNOSIS — E1129 Type 2 diabetes mellitus with other diabetic kidney complication: Secondary | ICD-10-CM | POA: Diagnosis not present

## 2018-04-18 DIAGNOSIS — Z992 Dependence on renal dialysis: Secondary | ICD-10-CM | POA: Diagnosis not present

## 2018-04-21 DIAGNOSIS — Z992 Dependence on renal dialysis: Secondary | ICD-10-CM | POA: Diagnosis not present

## 2018-04-21 DIAGNOSIS — N2581 Secondary hyperparathyroidism of renal origin: Secondary | ICD-10-CM | POA: Diagnosis not present

## 2018-04-21 DIAGNOSIS — N186 End stage renal disease: Secondary | ICD-10-CM | POA: Diagnosis not present

## 2018-04-21 DIAGNOSIS — E1129 Type 2 diabetes mellitus with other diabetic kidney complication: Secondary | ICD-10-CM | POA: Diagnosis not present

## 2018-04-23 DIAGNOSIS — Z992 Dependence on renal dialysis: Secondary | ICD-10-CM | POA: Diagnosis not present

## 2018-04-23 DIAGNOSIS — N2581 Secondary hyperparathyroidism of renal origin: Secondary | ICD-10-CM | POA: Diagnosis not present

## 2018-04-23 DIAGNOSIS — N186 End stage renal disease: Secondary | ICD-10-CM | POA: Diagnosis not present

## 2018-04-23 DIAGNOSIS — E1129 Type 2 diabetes mellitus with other diabetic kidney complication: Secondary | ICD-10-CM | POA: Diagnosis not present

## 2018-04-25 DIAGNOSIS — N186 End stage renal disease: Secondary | ICD-10-CM | POA: Diagnosis not present

## 2018-04-25 DIAGNOSIS — Z992 Dependence on renal dialysis: Secondary | ICD-10-CM | POA: Diagnosis not present

## 2018-04-25 DIAGNOSIS — N2581 Secondary hyperparathyroidism of renal origin: Secondary | ICD-10-CM | POA: Diagnosis not present

## 2018-04-25 DIAGNOSIS — E1129 Type 2 diabetes mellitus with other diabetic kidney complication: Secondary | ICD-10-CM | POA: Diagnosis not present

## 2018-04-28 DIAGNOSIS — Z992 Dependence on renal dialysis: Secondary | ICD-10-CM | POA: Diagnosis not present

## 2018-04-28 DIAGNOSIS — E1129 Type 2 diabetes mellitus with other diabetic kidney complication: Secondary | ICD-10-CM | POA: Diagnosis not present

## 2018-04-28 DIAGNOSIS — N186 End stage renal disease: Secondary | ICD-10-CM | POA: Diagnosis not present

## 2018-04-28 DIAGNOSIS — N2581 Secondary hyperparathyroidism of renal origin: Secondary | ICD-10-CM | POA: Diagnosis not present

## 2018-04-30 DIAGNOSIS — N2581 Secondary hyperparathyroidism of renal origin: Secondary | ICD-10-CM | POA: Diagnosis not present

## 2018-04-30 DIAGNOSIS — E1129 Type 2 diabetes mellitus with other diabetic kidney complication: Secondary | ICD-10-CM | POA: Diagnosis not present

## 2018-04-30 DIAGNOSIS — N186 End stage renal disease: Secondary | ICD-10-CM | POA: Diagnosis not present

## 2018-04-30 DIAGNOSIS — Z992 Dependence on renal dialysis: Secondary | ICD-10-CM | POA: Diagnosis not present

## 2018-05-05 DIAGNOSIS — N2581 Secondary hyperparathyroidism of renal origin: Secondary | ICD-10-CM | POA: Diagnosis not present

## 2018-05-05 DIAGNOSIS — Z992 Dependence on renal dialysis: Secondary | ICD-10-CM | POA: Diagnosis not present

## 2018-05-05 DIAGNOSIS — N186 End stage renal disease: Secondary | ICD-10-CM | POA: Diagnosis not present

## 2018-05-05 DIAGNOSIS — E1129 Type 2 diabetes mellitus with other diabetic kidney complication: Secondary | ICD-10-CM | POA: Diagnosis not present

## 2018-05-09 DIAGNOSIS — N2581 Secondary hyperparathyroidism of renal origin: Secondary | ICD-10-CM | POA: Diagnosis not present

## 2018-05-09 DIAGNOSIS — E1129 Type 2 diabetes mellitus with other diabetic kidney complication: Secondary | ICD-10-CM | POA: Diagnosis not present

## 2018-05-09 DIAGNOSIS — Z992 Dependence on renal dialysis: Secondary | ICD-10-CM | POA: Diagnosis not present

## 2018-05-09 DIAGNOSIS — N186 End stage renal disease: Secondary | ICD-10-CM | POA: Diagnosis not present

## 2018-05-12 DIAGNOSIS — E1129 Type 2 diabetes mellitus with other diabetic kidney complication: Secondary | ICD-10-CM | POA: Diagnosis not present

## 2018-05-12 DIAGNOSIS — N186 End stage renal disease: Secondary | ICD-10-CM | POA: Diagnosis not present

## 2018-05-12 DIAGNOSIS — N2581 Secondary hyperparathyroidism of renal origin: Secondary | ICD-10-CM | POA: Diagnosis not present

## 2018-05-12 DIAGNOSIS — Z992 Dependence on renal dialysis: Secondary | ICD-10-CM | POA: Diagnosis not present

## 2018-05-14 DIAGNOSIS — D631 Anemia in chronic kidney disease: Secondary | ICD-10-CM | POA: Diagnosis not present

## 2018-05-14 DIAGNOSIS — E875 Hyperkalemia: Secondary | ICD-10-CM | POA: Diagnosis not present

## 2018-05-14 DIAGNOSIS — N2581 Secondary hyperparathyroidism of renal origin: Secondary | ICD-10-CM | POA: Diagnosis not present

## 2018-05-14 DIAGNOSIS — E1129 Type 2 diabetes mellitus with other diabetic kidney complication: Secondary | ICD-10-CM | POA: Diagnosis not present

## 2018-05-14 DIAGNOSIS — I120 Hypertensive chronic kidney disease with stage 5 chronic kidney disease or end stage renal disease: Secondary | ICD-10-CM | POA: Diagnosis not present

## 2018-05-14 DIAGNOSIS — Z992 Dependence on renal dialysis: Secondary | ICD-10-CM | POA: Diagnosis not present

## 2018-05-14 DIAGNOSIS — N186 End stage renal disease: Secondary | ICD-10-CM | POA: Diagnosis not present

## 2018-05-16 DIAGNOSIS — E1129 Type 2 diabetes mellitus with other diabetic kidney complication: Secondary | ICD-10-CM | POA: Diagnosis not present

## 2018-05-16 DIAGNOSIS — E875 Hyperkalemia: Secondary | ICD-10-CM | POA: Diagnosis not present

## 2018-05-16 DIAGNOSIS — N2581 Secondary hyperparathyroidism of renal origin: Secondary | ICD-10-CM | POA: Diagnosis not present

## 2018-05-16 DIAGNOSIS — D631 Anemia in chronic kidney disease: Secondary | ICD-10-CM | POA: Diagnosis not present

## 2018-05-16 DIAGNOSIS — N186 End stage renal disease: Secondary | ICD-10-CM | POA: Diagnosis not present

## 2018-05-16 DIAGNOSIS — Z992 Dependence on renal dialysis: Secondary | ICD-10-CM | POA: Diagnosis not present

## 2018-05-19 DIAGNOSIS — E1129 Type 2 diabetes mellitus with other diabetic kidney complication: Secondary | ICD-10-CM | POA: Diagnosis not present

## 2018-05-19 DIAGNOSIS — N186 End stage renal disease: Secondary | ICD-10-CM | POA: Diagnosis not present

## 2018-05-19 DIAGNOSIS — D631 Anemia in chronic kidney disease: Secondary | ICD-10-CM | POA: Diagnosis not present

## 2018-05-19 DIAGNOSIS — N2581 Secondary hyperparathyroidism of renal origin: Secondary | ICD-10-CM | POA: Diagnosis not present

## 2018-05-19 DIAGNOSIS — E875 Hyperkalemia: Secondary | ICD-10-CM | POA: Diagnosis not present

## 2018-05-19 DIAGNOSIS — Z992 Dependence on renal dialysis: Secondary | ICD-10-CM | POA: Diagnosis not present

## 2018-05-21 DIAGNOSIS — N2581 Secondary hyperparathyroidism of renal origin: Secondary | ICD-10-CM | POA: Diagnosis not present

## 2018-05-21 DIAGNOSIS — Z992 Dependence on renal dialysis: Secondary | ICD-10-CM | POA: Diagnosis not present

## 2018-05-21 DIAGNOSIS — N186 End stage renal disease: Secondary | ICD-10-CM | POA: Diagnosis not present

## 2018-05-21 DIAGNOSIS — D631 Anemia in chronic kidney disease: Secondary | ICD-10-CM | POA: Diagnosis not present

## 2018-05-21 DIAGNOSIS — E1129 Type 2 diabetes mellitus with other diabetic kidney complication: Secondary | ICD-10-CM | POA: Diagnosis not present

## 2018-05-21 DIAGNOSIS — E875 Hyperkalemia: Secondary | ICD-10-CM | POA: Diagnosis not present

## 2018-05-23 DIAGNOSIS — Z992 Dependence on renal dialysis: Secondary | ICD-10-CM | POA: Diagnosis not present

## 2018-05-23 DIAGNOSIS — E875 Hyperkalemia: Secondary | ICD-10-CM | POA: Diagnosis not present

## 2018-05-23 DIAGNOSIS — E1129 Type 2 diabetes mellitus with other diabetic kidney complication: Secondary | ICD-10-CM | POA: Diagnosis not present

## 2018-05-23 DIAGNOSIS — N2581 Secondary hyperparathyroidism of renal origin: Secondary | ICD-10-CM | POA: Diagnosis not present

## 2018-05-23 DIAGNOSIS — N186 End stage renal disease: Secondary | ICD-10-CM | POA: Diagnosis not present

## 2018-05-23 DIAGNOSIS — D631 Anemia in chronic kidney disease: Secondary | ICD-10-CM | POA: Diagnosis not present

## 2018-05-26 DIAGNOSIS — Z992 Dependence on renal dialysis: Secondary | ICD-10-CM | POA: Diagnosis not present

## 2018-05-26 DIAGNOSIS — D631 Anemia in chronic kidney disease: Secondary | ICD-10-CM | POA: Diagnosis not present

## 2018-05-26 DIAGNOSIS — N2581 Secondary hyperparathyroidism of renal origin: Secondary | ICD-10-CM | POA: Diagnosis not present

## 2018-05-26 DIAGNOSIS — N186 End stage renal disease: Secondary | ICD-10-CM | POA: Diagnosis not present

## 2018-05-26 DIAGNOSIS — E1129 Type 2 diabetes mellitus with other diabetic kidney complication: Secondary | ICD-10-CM | POA: Diagnosis not present

## 2018-05-26 DIAGNOSIS — E875 Hyperkalemia: Secondary | ICD-10-CM | POA: Diagnosis not present

## 2018-05-28 DIAGNOSIS — D631 Anemia in chronic kidney disease: Secondary | ICD-10-CM | POA: Diagnosis not present

## 2018-05-28 DIAGNOSIS — E875 Hyperkalemia: Secondary | ICD-10-CM | POA: Diagnosis not present

## 2018-05-28 DIAGNOSIS — N2581 Secondary hyperparathyroidism of renal origin: Secondary | ICD-10-CM | POA: Diagnosis not present

## 2018-05-28 DIAGNOSIS — N186 End stage renal disease: Secondary | ICD-10-CM | POA: Diagnosis not present

## 2018-05-28 DIAGNOSIS — Z992 Dependence on renal dialysis: Secondary | ICD-10-CM | POA: Diagnosis not present

## 2018-05-28 DIAGNOSIS — E1129 Type 2 diabetes mellitus with other diabetic kidney complication: Secondary | ICD-10-CM | POA: Diagnosis not present

## 2018-05-30 DIAGNOSIS — D631 Anemia in chronic kidney disease: Secondary | ICD-10-CM | POA: Diagnosis not present

## 2018-05-30 DIAGNOSIS — N2581 Secondary hyperparathyroidism of renal origin: Secondary | ICD-10-CM | POA: Diagnosis not present

## 2018-05-30 DIAGNOSIS — E1129 Type 2 diabetes mellitus with other diabetic kidney complication: Secondary | ICD-10-CM | POA: Diagnosis not present

## 2018-05-30 DIAGNOSIS — E875 Hyperkalemia: Secondary | ICD-10-CM | POA: Diagnosis not present

## 2018-05-30 DIAGNOSIS — N186 End stage renal disease: Secondary | ICD-10-CM | POA: Diagnosis not present

## 2018-05-30 DIAGNOSIS — Z992 Dependence on renal dialysis: Secondary | ICD-10-CM | POA: Diagnosis not present

## 2018-06-02 DIAGNOSIS — E1129 Type 2 diabetes mellitus with other diabetic kidney complication: Secondary | ICD-10-CM | POA: Diagnosis not present

## 2018-06-02 DIAGNOSIS — D631 Anemia in chronic kidney disease: Secondary | ICD-10-CM | POA: Diagnosis not present

## 2018-06-02 DIAGNOSIS — Z992 Dependence on renal dialysis: Secondary | ICD-10-CM | POA: Diagnosis not present

## 2018-06-02 DIAGNOSIS — N186 End stage renal disease: Secondary | ICD-10-CM | POA: Diagnosis not present

## 2018-06-02 DIAGNOSIS — E875 Hyperkalemia: Secondary | ICD-10-CM | POA: Diagnosis not present

## 2018-06-02 DIAGNOSIS — N2581 Secondary hyperparathyroidism of renal origin: Secondary | ICD-10-CM | POA: Diagnosis not present

## 2018-06-04 ENCOUNTER — Other Ambulatory Visit: Payer: Self-pay | Admitting: *Deleted

## 2018-06-04 DIAGNOSIS — N2581 Secondary hyperparathyroidism of renal origin: Secondary | ICD-10-CM | POA: Diagnosis not present

## 2018-06-04 DIAGNOSIS — Z992 Dependence on renal dialysis: Secondary | ICD-10-CM | POA: Diagnosis not present

## 2018-06-04 DIAGNOSIS — E1129 Type 2 diabetes mellitus with other diabetic kidney complication: Secondary | ICD-10-CM | POA: Diagnosis not present

## 2018-06-04 DIAGNOSIS — D631 Anemia in chronic kidney disease: Secondary | ICD-10-CM | POA: Diagnosis not present

## 2018-06-04 DIAGNOSIS — N186 End stage renal disease: Secondary | ICD-10-CM | POA: Diagnosis not present

## 2018-06-04 DIAGNOSIS — E875 Hyperkalemia: Secondary | ICD-10-CM | POA: Diagnosis not present

## 2018-06-04 NOTE — Patient Outreach (Signed)
Outreach call to pt for screening/ insurance referral, no answer to telephone and goes immediately to voicemail, RN CM left voicemail requesting return phone call.  RN CM mailed unsuccessful outreach letter to pt home.  PLAN Outreach pt in 3-4 business days  Jacqlyn Larsen [facility], [place] [phone]

## 2018-06-06 DIAGNOSIS — N186 End stage renal disease: Secondary | ICD-10-CM | POA: Diagnosis not present

## 2018-06-06 DIAGNOSIS — E1129 Type 2 diabetes mellitus with other diabetic kidney complication: Secondary | ICD-10-CM | POA: Diagnosis not present

## 2018-06-06 DIAGNOSIS — E875 Hyperkalemia: Secondary | ICD-10-CM | POA: Diagnosis not present

## 2018-06-06 DIAGNOSIS — D631 Anemia in chronic kidney disease: Secondary | ICD-10-CM | POA: Diagnosis not present

## 2018-06-06 DIAGNOSIS — Z992 Dependence on renal dialysis: Secondary | ICD-10-CM | POA: Diagnosis not present

## 2018-06-06 DIAGNOSIS — N2581 Secondary hyperparathyroidism of renal origin: Secondary | ICD-10-CM | POA: Diagnosis not present

## 2018-06-09 ENCOUNTER — Other Ambulatory Visit: Payer: Self-pay

## 2018-06-09 ENCOUNTER — Other Ambulatory Visit: Payer: Self-pay | Admitting: *Deleted

## 2018-06-09 DIAGNOSIS — N186 End stage renal disease: Secondary | ICD-10-CM

## 2018-06-09 DIAGNOSIS — D631 Anemia in chronic kidney disease: Secondary | ICD-10-CM | POA: Diagnosis not present

## 2018-06-09 DIAGNOSIS — E875 Hyperkalemia: Secondary | ICD-10-CM | POA: Diagnosis not present

## 2018-06-09 DIAGNOSIS — Z992 Dependence on renal dialysis: Secondary | ICD-10-CM | POA: Diagnosis not present

## 2018-06-09 DIAGNOSIS — E1129 Type 2 diabetes mellitus with other diabetic kidney complication: Secondary | ICD-10-CM | POA: Diagnosis not present

## 2018-06-09 DIAGNOSIS — N2581 Secondary hyperparathyroidism of renal origin: Secondary | ICD-10-CM | POA: Diagnosis not present

## 2018-06-09 NOTE — Patient Outreach (Signed)
Outreach call to pt for screening/ insurance referral, spoke with pt, HIPAA verified, pt reports he goes to dialysis 3 days per week, has HF and "monitors weight very closely and also at dialysis"  Pt reports he is on no medication for diabetes and CBG is checked at dialysis with most readings below 105.  Pt reports he still drives, has assistance of nephew if needed, follows up with cardiology, primary care, nephrology on regular basis.  Pt reports he feels he manages his conditions well at present, states his main concern is he cannot always afford his medications and would like assistance /resources,  RN CM placed order for [facility] pharmacist, mailed successful outreach letter to pt home with 24 hour nurse magnet line and pamphlet, written consent was obtained previously.  Order placed for [facility] pharmacist services  Jacqlyn Larsen [facility], [place] Coordinator [phone]

## 2018-06-10 ENCOUNTER — Other Ambulatory Visit: Payer: Self-pay | Admitting: Pharmacist

## 2018-06-10 NOTE — Patient Outreach (Signed)
[facility]) Care Management  [place]   06/10/2018  Daniel Whitney 1955-09-28 [id number]  Reason for referral: Medication Assistance  Referral source: [facility] RN Current insurance: unknown  PMHx includes but not limited to:  HTN, T2DM, ESRD on HD MWF, CAD, CHF, PAF, HLD, hx ischemic stroke + SDH - no longer on anticoagulation for PAF, pulmonary hypertension, hypothyroidism, GERD, morbid obesity, OSA  Outreach:  Unsuccessful telephone call attempt #1 to patient. HIPAA compliant voicemail left requesting a return call  Plan:  -Noted that another [facility] discipline has recently mailed patient an unsuccessful outreach letter.  -I will make another outreach attempt to patient within 3-4 business days.    Ralene Bathe, PharmD, [place] [phone]

## 2018-06-11 DIAGNOSIS — N2581 Secondary hyperparathyroidism of renal origin: Secondary | ICD-10-CM | POA: Diagnosis not present

## 2018-06-11 DIAGNOSIS — Z992 Dependence on renal dialysis: Secondary | ICD-10-CM | POA: Diagnosis not present

## 2018-06-11 DIAGNOSIS — D631 Anemia in chronic kidney disease: Secondary | ICD-10-CM | POA: Diagnosis not present

## 2018-06-11 DIAGNOSIS — N186 End stage renal disease: Secondary | ICD-10-CM | POA: Diagnosis not present

## 2018-06-11 DIAGNOSIS — E1129 Type 2 diabetes mellitus with other diabetic kidney complication: Secondary | ICD-10-CM | POA: Diagnosis not present

## 2018-06-11 DIAGNOSIS — E875 Hyperkalemia: Secondary | ICD-10-CM | POA: Diagnosis not present

## 2018-06-13 DIAGNOSIS — D631 Anemia in chronic kidney disease: Secondary | ICD-10-CM | POA: Diagnosis not present

## 2018-06-13 DIAGNOSIS — Z992 Dependence on renal dialysis: Secondary | ICD-10-CM | POA: Diagnosis not present

## 2018-06-13 DIAGNOSIS — N186 End stage renal disease: Secondary | ICD-10-CM | POA: Diagnosis not present

## 2018-06-13 DIAGNOSIS — I120 Hypertensive chronic kidney disease with stage 5 chronic kidney disease or end stage renal disease: Secondary | ICD-10-CM | POA: Diagnosis not present

## 2018-06-13 DIAGNOSIS — E1129 Type 2 diabetes mellitus with other diabetic kidney complication: Secondary | ICD-10-CM | POA: Diagnosis not present

## 2018-06-13 DIAGNOSIS — N2581 Secondary hyperparathyroidism of renal origin: Secondary | ICD-10-CM | POA: Diagnosis not present

## 2018-06-16 ENCOUNTER — Other Ambulatory Visit: Payer: Self-pay | Admitting: Pharmacist

## 2018-06-16 ENCOUNTER — Ambulatory Visit: Payer: Self-pay | Admitting: Pharmacist

## 2018-06-16 DIAGNOSIS — E1129 Type 2 diabetes mellitus with other diabetic kidney complication: Secondary | ICD-10-CM | POA: Diagnosis not present

## 2018-06-16 DIAGNOSIS — N186 End stage renal disease: Secondary | ICD-10-CM | POA: Diagnosis not present

## 2018-06-16 DIAGNOSIS — D631 Anemia in chronic kidney disease: Secondary | ICD-10-CM | POA: Diagnosis not present

## 2018-06-16 DIAGNOSIS — Z992 Dependence on renal dialysis: Secondary | ICD-10-CM | POA: Diagnosis not present

## 2018-06-16 DIAGNOSIS — N2581 Secondary hyperparathyroidism of renal origin: Secondary | ICD-10-CM | POA: Diagnosis not present

## 2018-06-16 NOTE — Patient Outreach (Signed)
[facility]) Care Management  [place]  06/16/2018  Ahkeem Goede 1955/11/05 [id number]   Reason for referral: Medication Assistance  Referral source: [facility] RN Current insurance: Unknown  Outreach:  Unsuccessful telephone call attempt #2 to patient.   Patient reports being in dialysis center and requests that I call him tomorrow morning.    Plan:  -I will make another outreach attempt to patient in 1 day.    Ralene Bathe, PharmD, [place] [phone]

## 2018-06-17 ENCOUNTER — Ambulatory Visit: Payer: Self-pay | Admitting: Pharmacist

## 2018-06-17 ENCOUNTER — Other Ambulatory Visit: Payer: Self-pay | Admitting: Pharmacist

## 2018-06-17 NOTE — Patient Outreach (Signed)
[place] [facility]) Care Management  [place]  06/17/2018  Daniel Whitney October 12, 1955 [id number]   Reason for referral: Medication Assistance  Referral source: [facility] RN Current insurance: Unknown  Outreach:  Unsuccessful telephone call attempt #3 to patient.   HIPAA compliant voicemail left requesting a return call  Plan:  -I will follow-up on 10th business day from opening case.  If no response from patient at this time, I will close [place] case.    Ralene Bathe, PharmD, [place] [phone]

## 2018-06-18 DIAGNOSIS — N186 End stage renal disease: Secondary | ICD-10-CM | POA: Diagnosis not present

## 2018-06-18 DIAGNOSIS — D631 Anemia in chronic kidney disease: Secondary | ICD-10-CM | POA: Diagnosis not present

## 2018-06-18 DIAGNOSIS — E1129 Type 2 diabetes mellitus with other diabetic kidney complication: Secondary | ICD-10-CM | POA: Diagnosis not present

## 2018-06-18 DIAGNOSIS — N2581 Secondary hyperparathyroidism of renal origin: Secondary | ICD-10-CM | POA: Diagnosis not present

## 2018-06-18 DIAGNOSIS — Z992 Dependence on renal dialysis: Secondary | ICD-10-CM | POA: Diagnosis not present

## 2018-06-20 DIAGNOSIS — N186 End stage renal disease: Secondary | ICD-10-CM | POA: Diagnosis not present

## 2018-06-20 DIAGNOSIS — N2581 Secondary hyperparathyroidism of renal origin: Secondary | ICD-10-CM | POA: Diagnosis not present

## 2018-06-20 DIAGNOSIS — E1129 Type 2 diabetes mellitus with other diabetic kidney complication: Secondary | ICD-10-CM | POA: Diagnosis not present

## 2018-06-20 DIAGNOSIS — D631 Anemia in chronic kidney disease: Secondary | ICD-10-CM | POA: Diagnosis not present

## 2018-06-20 DIAGNOSIS — Z992 Dependence on renal dialysis: Secondary | ICD-10-CM | POA: Diagnosis not present

## 2018-06-23 ENCOUNTER — Other Ambulatory Visit: Payer: Self-pay | Admitting: Pharmacist

## 2018-06-23 DIAGNOSIS — E1129 Type 2 diabetes mellitus with other diabetic kidney complication: Secondary | ICD-10-CM | POA: Diagnosis not present

## 2018-06-23 DIAGNOSIS — D631 Anemia in chronic kidney disease: Secondary | ICD-10-CM | POA: Diagnosis not present

## 2018-06-23 DIAGNOSIS — N186 End stage renal disease: Secondary | ICD-10-CM | POA: Diagnosis not present

## 2018-06-23 DIAGNOSIS — Z992 Dependence on renal dialysis: Secondary | ICD-10-CM | POA: Diagnosis not present

## 2018-06-23 DIAGNOSIS — N2581 Secondary hyperparathyroidism of renal origin: Secondary | ICD-10-CM | POA: Diagnosis not present

## 2018-06-23 NOTE — Patient Outreach (Signed)
[facility]) Care Management [place]  06/23/2018  Daniel Whitney Jun 26, 1955 [id number]  Reason for referral: medication assistance  [facility] pharmacy case is being closed due to the following reasons:  We have been unable to establish and/or maintain contact with the patient.  -I am happy to assist in the future if patient interested in [place], PharmD, [place] [phone]

## 2018-06-25 DIAGNOSIS — N186 End stage renal disease: Secondary | ICD-10-CM | POA: Diagnosis not present

## 2018-06-25 DIAGNOSIS — N2581 Secondary hyperparathyroidism of renal origin: Secondary | ICD-10-CM | POA: Diagnosis not present

## 2018-06-25 DIAGNOSIS — D631 Anemia in chronic kidney disease: Secondary | ICD-10-CM | POA: Diagnosis not present

## 2018-06-25 DIAGNOSIS — Z992 Dependence on renal dialysis: Secondary | ICD-10-CM | POA: Diagnosis not present

## 2018-06-25 DIAGNOSIS — E1129 Type 2 diabetes mellitus with other diabetic kidney complication: Secondary | ICD-10-CM | POA: Diagnosis not present

## 2018-06-27 DIAGNOSIS — D631 Anemia in chronic kidney disease: Secondary | ICD-10-CM | POA: Diagnosis not present

## 2018-06-27 DIAGNOSIS — Z992 Dependence on renal dialysis: Secondary | ICD-10-CM | POA: Diagnosis not present

## 2018-06-27 DIAGNOSIS — E1129 Type 2 diabetes mellitus with other diabetic kidney complication: Secondary | ICD-10-CM | POA: Diagnosis not present

## 2018-06-27 DIAGNOSIS — N2581 Secondary hyperparathyroidism of renal origin: Secondary | ICD-10-CM | POA: Diagnosis not present

## 2018-06-27 DIAGNOSIS — N186 End stage renal disease: Secondary | ICD-10-CM | POA: Diagnosis not present

## 2018-06-30 DIAGNOSIS — D631 Anemia in chronic kidney disease: Secondary | ICD-10-CM | POA: Diagnosis not present

## 2018-06-30 DIAGNOSIS — N2581 Secondary hyperparathyroidism of renal origin: Secondary | ICD-10-CM | POA: Diagnosis not present

## 2018-06-30 DIAGNOSIS — N186 End stage renal disease: Secondary | ICD-10-CM | POA: Diagnosis not present

## 2018-06-30 DIAGNOSIS — Z992 Dependence on renal dialysis: Secondary | ICD-10-CM | POA: Diagnosis not present

## 2018-06-30 DIAGNOSIS — E1129 Type 2 diabetes mellitus with other diabetic kidney complication: Secondary | ICD-10-CM | POA: Diagnosis not present

## 2018-07-02 DIAGNOSIS — D631 Anemia in chronic kidney disease: Secondary | ICD-10-CM | POA: Diagnosis not present

## 2018-07-02 DIAGNOSIS — E1129 Type 2 diabetes mellitus with other diabetic kidney complication: Secondary | ICD-10-CM | POA: Diagnosis not present

## 2018-07-02 DIAGNOSIS — Z992 Dependence on renal dialysis: Secondary | ICD-10-CM | POA: Diagnosis not present

## 2018-07-02 DIAGNOSIS — N2581 Secondary hyperparathyroidism of renal origin: Secondary | ICD-10-CM | POA: Diagnosis not present

## 2018-07-02 DIAGNOSIS — N186 End stage renal disease: Secondary | ICD-10-CM | POA: Diagnosis not present

## 2018-07-03 DIAGNOSIS — N2581 Secondary hyperparathyroidism of renal origin: Secondary | ICD-10-CM | POA: Diagnosis not present

## 2018-07-03 DIAGNOSIS — N186 End stage renal disease: Secondary | ICD-10-CM | POA: Diagnosis not present

## 2018-07-03 DIAGNOSIS — E8779 Other fluid overload: Secondary | ICD-10-CM | POA: Diagnosis not present

## 2018-07-04 DIAGNOSIS — Z992 Dependence on renal dialysis: Secondary | ICD-10-CM | POA: Diagnosis not present

## 2018-07-04 DIAGNOSIS — N2581 Secondary hyperparathyroidism of renal origin: Secondary | ICD-10-CM | POA: Diagnosis not present

## 2018-07-04 DIAGNOSIS — N186 End stage renal disease: Secondary | ICD-10-CM | POA: Diagnosis not present

## 2018-07-04 DIAGNOSIS — E1129 Type 2 diabetes mellitus with other diabetic kidney complication: Secondary | ICD-10-CM | POA: Diagnosis not present

## 2018-07-04 DIAGNOSIS — D631 Anemia in chronic kidney disease: Secondary | ICD-10-CM | POA: Diagnosis not present

## 2018-07-07 DIAGNOSIS — Z992 Dependence on renal dialysis: Secondary | ICD-10-CM | POA: Diagnosis not present

## 2018-07-07 DIAGNOSIS — N186 End stage renal disease: Secondary | ICD-10-CM | POA: Diagnosis not present

## 2018-07-07 DIAGNOSIS — E1129 Type 2 diabetes mellitus with other diabetic kidney complication: Secondary | ICD-10-CM | POA: Diagnosis not present

## 2018-07-07 DIAGNOSIS — D631 Anemia in chronic kidney disease: Secondary | ICD-10-CM | POA: Diagnosis not present

## 2018-07-07 DIAGNOSIS — N2581 Secondary hyperparathyroidism of renal origin: Secondary | ICD-10-CM | POA: Diagnosis not present

## 2018-07-09 DIAGNOSIS — E1129 Type 2 diabetes mellitus with other diabetic kidney complication: Secondary | ICD-10-CM | POA: Diagnosis not present

## 2018-07-09 DIAGNOSIS — N186 End stage renal disease: Secondary | ICD-10-CM | POA: Diagnosis not present

## 2018-07-09 DIAGNOSIS — Z992 Dependence on renal dialysis: Secondary | ICD-10-CM | POA: Diagnosis not present

## 2018-07-09 DIAGNOSIS — N2581 Secondary hyperparathyroidism of renal origin: Secondary | ICD-10-CM | POA: Diagnosis not present

## 2018-07-09 DIAGNOSIS — D631 Anemia in chronic kidney disease: Secondary | ICD-10-CM | POA: Diagnosis not present

## 2018-07-11 DIAGNOSIS — D631 Anemia in chronic kidney disease: Secondary | ICD-10-CM | POA: Diagnosis not present

## 2018-07-11 DIAGNOSIS — N2581 Secondary hyperparathyroidism of renal origin: Secondary | ICD-10-CM | POA: Diagnosis not present

## 2018-07-11 DIAGNOSIS — N186 End stage renal disease: Secondary | ICD-10-CM | POA: Diagnosis not present

## 2018-07-11 DIAGNOSIS — E1129 Type 2 diabetes mellitus with other diabetic kidney complication: Secondary | ICD-10-CM | POA: Diagnosis not present

## 2018-07-11 DIAGNOSIS — Z992 Dependence on renal dialysis: Secondary | ICD-10-CM | POA: Diagnosis not present

## 2018-07-14 DIAGNOSIS — N186 End stage renal disease: Secondary | ICD-10-CM | POA: Diagnosis not present

## 2018-07-14 DIAGNOSIS — E1129 Type 2 diabetes mellitus with other diabetic kidney complication: Secondary | ICD-10-CM | POA: Diagnosis not present

## 2018-07-14 DIAGNOSIS — Z992 Dependence on renal dialysis: Secondary | ICD-10-CM | POA: Diagnosis not present

## 2018-07-14 DIAGNOSIS — N2581 Secondary hyperparathyroidism of renal origin: Secondary | ICD-10-CM | POA: Diagnosis not present

## 2018-07-14 DIAGNOSIS — I120 Hypertensive chronic kidney disease with stage 5 chronic kidney disease or end stage renal disease: Secondary | ICD-10-CM | POA: Diagnosis not present

## 2018-07-14 DIAGNOSIS — D631 Anemia in chronic kidney disease: Secondary | ICD-10-CM | POA: Diagnosis not present

## 2018-07-16 DIAGNOSIS — E1129 Type 2 diabetes mellitus with other diabetic kidney complication: Secondary | ICD-10-CM | POA: Diagnosis not present

## 2018-07-16 DIAGNOSIS — Z992 Dependence on renal dialysis: Secondary | ICD-10-CM | POA: Diagnosis not present

## 2018-07-16 DIAGNOSIS — N2581 Secondary hyperparathyroidism of renal origin: Secondary | ICD-10-CM | POA: Diagnosis not present

## 2018-07-16 DIAGNOSIS — N186 End stage renal disease: Secondary | ICD-10-CM | POA: Diagnosis not present

## 2018-07-16 DIAGNOSIS — D631 Anemia in chronic kidney disease: Secondary | ICD-10-CM | POA: Diagnosis not present

## 2018-07-18 DIAGNOSIS — E1129 Type 2 diabetes mellitus with other diabetic kidney complication: Secondary | ICD-10-CM | POA: Diagnosis not present

## 2018-07-18 DIAGNOSIS — N2581 Secondary hyperparathyroidism of renal origin: Secondary | ICD-10-CM | POA: Diagnosis not present

## 2018-07-18 DIAGNOSIS — N186 End stage renal disease: Secondary | ICD-10-CM | POA: Diagnosis not present

## 2018-07-18 DIAGNOSIS — Z992 Dependence on renal dialysis: Secondary | ICD-10-CM | POA: Diagnosis not present

## 2018-07-18 DIAGNOSIS — D631 Anemia in chronic kidney disease: Secondary | ICD-10-CM | POA: Diagnosis not present

## 2018-07-21 DIAGNOSIS — N2581 Secondary hyperparathyroidism of renal origin: Secondary | ICD-10-CM | POA: Diagnosis not present

## 2018-07-21 DIAGNOSIS — Z992 Dependence on renal dialysis: Secondary | ICD-10-CM | POA: Diagnosis not present

## 2018-07-21 DIAGNOSIS — D631 Anemia in chronic kidney disease: Secondary | ICD-10-CM | POA: Diagnosis not present

## 2018-07-21 DIAGNOSIS — E1129 Type 2 diabetes mellitus with other diabetic kidney complication: Secondary | ICD-10-CM | POA: Diagnosis not present

## 2018-07-21 DIAGNOSIS — N186 End stage renal disease: Secondary | ICD-10-CM | POA: Diagnosis not present

## 2018-07-23 DIAGNOSIS — N2581 Secondary hyperparathyroidism of renal origin: Secondary | ICD-10-CM | POA: Diagnosis not present

## 2018-07-23 DIAGNOSIS — N186 End stage renal disease: Secondary | ICD-10-CM | POA: Diagnosis not present

## 2018-07-23 DIAGNOSIS — E1129 Type 2 diabetes mellitus with other diabetic kidney complication: Secondary | ICD-10-CM | POA: Diagnosis not present

## 2018-07-23 DIAGNOSIS — Z992 Dependence on renal dialysis: Secondary | ICD-10-CM | POA: Diagnosis not present

## 2018-07-23 DIAGNOSIS — D631 Anemia in chronic kidney disease: Secondary | ICD-10-CM | POA: Diagnosis not present

## 2018-07-25 DIAGNOSIS — Z992 Dependence on renal dialysis: Secondary | ICD-10-CM | POA: Diagnosis not present

## 2018-07-25 DIAGNOSIS — N186 End stage renal disease: Secondary | ICD-10-CM | POA: Diagnosis not present

## 2018-07-25 DIAGNOSIS — N2581 Secondary hyperparathyroidism of renal origin: Secondary | ICD-10-CM | POA: Diagnosis not present

## 2018-07-25 DIAGNOSIS — D631 Anemia in chronic kidney disease: Secondary | ICD-10-CM | POA: Diagnosis not present

## 2018-07-25 DIAGNOSIS — E1129 Type 2 diabetes mellitus with other diabetic kidney complication: Secondary | ICD-10-CM | POA: Diagnosis not present

## 2018-07-28 DIAGNOSIS — N2581 Secondary hyperparathyroidism of renal origin: Secondary | ICD-10-CM | POA: Diagnosis not present

## 2018-07-28 DIAGNOSIS — Z992 Dependence on renal dialysis: Secondary | ICD-10-CM | POA: Diagnosis not present

## 2018-07-28 DIAGNOSIS — N186 End stage renal disease: Secondary | ICD-10-CM | POA: Diagnosis not present

## 2018-07-28 DIAGNOSIS — E1129 Type 2 diabetes mellitus with other diabetic kidney complication: Secondary | ICD-10-CM | POA: Diagnosis not present

## 2018-07-28 DIAGNOSIS — D631 Anemia in chronic kidney disease: Secondary | ICD-10-CM | POA: Diagnosis not present

## 2018-07-30 DIAGNOSIS — Z992 Dependence on renal dialysis: Secondary | ICD-10-CM | POA: Diagnosis not present

## 2018-07-30 DIAGNOSIS — D631 Anemia in chronic kidney disease: Secondary | ICD-10-CM | POA: Diagnosis not present

## 2018-07-30 DIAGNOSIS — E1129 Type 2 diabetes mellitus with other diabetic kidney complication: Secondary | ICD-10-CM | POA: Diagnosis not present

## 2018-07-30 DIAGNOSIS — N186 End stage renal disease: Secondary | ICD-10-CM | POA: Diagnosis not present

## 2018-07-30 DIAGNOSIS — N2581 Secondary hyperparathyroidism of renal origin: Secondary | ICD-10-CM | POA: Diagnosis not present

## 2018-07-31 DIAGNOSIS — N186 End stage renal disease: Secondary | ICD-10-CM | POA: Diagnosis not present

## 2018-07-31 DIAGNOSIS — I871 Compression of vein: Secondary | ICD-10-CM | POA: Diagnosis not present

## 2018-07-31 DIAGNOSIS — L602 Onychogryphosis: Secondary | ICD-10-CM | POA: Diagnosis not present

## 2018-07-31 DIAGNOSIS — T82858A Stenosis of vascular prosthetic devices, implants and grafts, initial encounter: Secondary | ICD-10-CM | POA: Diagnosis not present

## 2018-07-31 DIAGNOSIS — Z992 Dependence on renal dialysis: Secondary | ICD-10-CM | POA: Diagnosis not present

## 2018-07-31 DIAGNOSIS — E1351 Other specified diabetes mellitus with diabetic peripheral angiopathy without gangrene: Secondary | ICD-10-CM | POA: Diagnosis not present

## 2018-08-01 DIAGNOSIS — E1129 Type 2 diabetes mellitus with other diabetic kidney complication: Secondary | ICD-10-CM | POA: Diagnosis not present

## 2018-08-01 DIAGNOSIS — D631 Anemia in chronic kidney disease: Secondary | ICD-10-CM | POA: Diagnosis not present

## 2018-08-01 DIAGNOSIS — N2581 Secondary hyperparathyroidism of renal origin: Secondary | ICD-10-CM | POA: Diagnosis not present

## 2018-08-01 DIAGNOSIS — Z992 Dependence on renal dialysis: Secondary | ICD-10-CM | POA: Diagnosis not present

## 2018-08-01 DIAGNOSIS — N186 End stage renal disease: Secondary | ICD-10-CM | POA: Diagnosis not present

## 2018-08-06 DIAGNOSIS — N2581 Secondary hyperparathyroidism of renal origin: Secondary | ICD-10-CM | POA: Diagnosis not present

## 2018-08-06 DIAGNOSIS — Z992 Dependence on renal dialysis: Secondary | ICD-10-CM | POA: Diagnosis not present

## 2018-08-06 DIAGNOSIS — E1129 Type 2 diabetes mellitus with other diabetic kidney complication: Secondary | ICD-10-CM | POA: Diagnosis not present

## 2018-08-06 DIAGNOSIS — D631 Anemia in chronic kidney disease: Secondary | ICD-10-CM | POA: Diagnosis not present

## 2018-08-06 DIAGNOSIS — N186 End stage renal disease: Secondary | ICD-10-CM | POA: Diagnosis not present

## 2018-08-08 DIAGNOSIS — E1129 Type 2 diabetes mellitus with other diabetic kidney complication: Secondary | ICD-10-CM | POA: Diagnosis not present

## 2018-08-08 DIAGNOSIS — Z992 Dependence on renal dialysis: Secondary | ICD-10-CM | POA: Diagnosis not present

## 2018-08-08 DIAGNOSIS — N186 End stage renal disease: Secondary | ICD-10-CM | POA: Diagnosis not present

## 2018-08-08 DIAGNOSIS — N2581 Secondary hyperparathyroidism of renal origin: Secondary | ICD-10-CM | POA: Diagnosis not present

## 2018-08-08 DIAGNOSIS — D631 Anemia in chronic kidney disease: Secondary | ICD-10-CM | POA: Diagnosis not present

## 2018-08-11 DIAGNOSIS — N186 End stage renal disease: Secondary | ICD-10-CM | POA: Diagnosis not present

## 2018-08-11 DIAGNOSIS — E1129 Type 2 diabetes mellitus with other diabetic kidney complication: Secondary | ICD-10-CM | POA: Diagnosis not present

## 2018-08-11 DIAGNOSIS — Z992 Dependence on renal dialysis: Secondary | ICD-10-CM | POA: Diagnosis not present

## 2018-08-11 DIAGNOSIS — D631 Anemia in chronic kidney disease: Secondary | ICD-10-CM | POA: Diagnosis not present

## 2018-08-11 DIAGNOSIS — N2581 Secondary hyperparathyroidism of renal origin: Secondary | ICD-10-CM | POA: Diagnosis not present

## 2018-08-13 DIAGNOSIS — D631 Anemia in chronic kidney disease: Secondary | ICD-10-CM | POA: Diagnosis not present

## 2018-08-13 DIAGNOSIS — I120 Hypertensive chronic kidney disease with stage 5 chronic kidney disease or end stage renal disease: Secondary | ICD-10-CM | POA: Diagnosis not present

## 2018-08-13 DIAGNOSIS — Z992 Dependence on renal dialysis: Secondary | ICD-10-CM | POA: Diagnosis not present

## 2018-08-13 DIAGNOSIS — N186 End stage renal disease: Secondary | ICD-10-CM | POA: Diagnosis not present

## 2018-08-13 DIAGNOSIS — N2581 Secondary hyperparathyroidism of renal origin: Secondary | ICD-10-CM | POA: Diagnosis not present

## 2018-08-13 DIAGNOSIS — E1129 Type 2 diabetes mellitus with other diabetic kidney complication: Secondary | ICD-10-CM | POA: Diagnosis not present

## 2018-08-15 DIAGNOSIS — Z992 Dependence on renal dialysis: Secondary | ICD-10-CM | POA: Diagnosis not present

## 2018-08-15 DIAGNOSIS — N186 End stage renal disease: Secondary | ICD-10-CM | POA: Diagnosis not present

## 2018-08-15 DIAGNOSIS — E1129 Type 2 diabetes mellitus with other diabetic kidney complication: Secondary | ICD-10-CM | POA: Diagnosis not present

## 2018-08-15 DIAGNOSIS — D631 Anemia in chronic kidney disease: Secondary | ICD-10-CM | POA: Diagnosis not present

## 2018-08-15 DIAGNOSIS — N2581 Secondary hyperparathyroidism of renal origin: Secondary | ICD-10-CM | POA: Diagnosis not present

## 2018-08-18 DIAGNOSIS — E1129 Type 2 diabetes mellitus with other diabetic kidney complication: Secondary | ICD-10-CM | POA: Diagnosis not present

## 2018-08-18 DIAGNOSIS — Z992 Dependence on renal dialysis: Secondary | ICD-10-CM | POA: Diagnosis not present

## 2018-08-18 DIAGNOSIS — D631 Anemia in chronic kidney disease: Secondary | ICD-10-CM | POA: Diagnosis not present

## 2018-08-18 DIAGNOSIS — N2581 Secondary hyperparathyroidism of renal origin: Secondary | ICD-10-CM | POA: Diagnosis not present

## 2018-08-18 DIAGNOSIS — N186 End stage renal disease: Secondary | ICD-10-CM | POA: Diagnosis not present

## 2018-08-20 DIAGNOSIS — D631 Anemia in chronic kidney disease: Secondary | ICD-10-CM | POA: Diagnosis not present

## 2018-08-20 DIAGNOSIS — E1129 Type 2 diabetes mellitus with other diabetic kidney complication: Secondary | ICD-10-CM | POA: Diagnosis not present

## 2018-08-20 DIAGNOSIS — N186 End stage renal disease: Secondary | ICD-10-CM | POA: Diagnosis not present

## 2018-08-20 DIAGNOSIS — Z992 Dependence on renal dialysis: Secondary | ICD-10-CM | POA: Diagnosis not present

## 2018-08-20 DIAGNOSIS — N2581 Secondary hyperparathyroidism of renal origin: Secondary | ICD-10-CM | POA: Diagnosis not present

## 2018-08-22 DIAGNOSIS — D631 Anemia in chronic kidney disease: Secondary | ICD-10-CM | POA: Diagnosis not present

## 2018-08-22 DIAGNOSIS — N186 End stage renal disease: Secondary | ICD-10-CM | POA: Diagnosis not present

## 2018-08-22 DIAGNOSIS — N2581 Secondary hyperparathyroidism of renal origin: Secondary | ICD-10-CM | POA: Diagnosis not present

## 2018-08-22 DIAGNOSIS — Z992 Dependence on renal dialysis: Secondary | ICD-10-CM | POA: Diagnosis not present

## 2018-08-22 DIAGNOSIS — E1129 Type 2 diabetes mellitus with other diabetic kidney complication: Secondary | ICD-10-CM | POA: Diagnosis not present

## 2018-08-25 DIAGNOSIS — Z992 Dependence on renal dialysis: Secondary | ICD-10-CM | POA: Diagnosis not present

## 2018-08-25 DIAGNOSIS — D631 Anemia in chronic kidney disease: Secondary | ICD-10-CM | POA: Diagnosis not present

## 2018-08-25 DIAGNOSIS — N2581 Secondary hyperparathyroidism of renal origin: Secondary | ICD-10-CM | POA: Diagnosis not present

## 2018-08-25 DIAGNOSIS — N186 End stage renal disease: Secondary | ICD-10-CM | POA: Diagnosis not present

## 2018-08-25 DIAGNOSIS — E1129 Type 2 diabetes mellitus with other diabetic kidney complication: Secondary | ICD-10-CM | POA: Diagnosis not present

## 2018-08-27 DIAGNOSIS — N2581 Secondary hyperparathyroidism of renal origin: Secondary | ICD-10-CM | POA: Diagnosis not present

## 2018-08-27 DIAGNOSIS — N186 End stage renal disease: Secondary | ICD-10-CM | POA: Diagnosis not present

## 2018-08-27 DIAGNOSIS — E1129 Type 2 diabetes mellitus with other diabetic kidney complication: Secondary | ICD-10-CM | POA: Diagnosis not present

## 2018-08-27 DIAGNOSIS — D631 Anemia in chronic kidney disease: Secondary | ICD-10-CM | POA: Diagnosis not present

## 2018-08-27 DIAGNOSIS — Z992 Dependence on renal dialysis: Secondary | ICD-10-CM | POA: Diagnosis not present

## 2018-08-28 DIAGNOSIS — H52223 Regular astigmatism, bilateral: Secondary | ICD-10-CM | POA: Diagnosis not present

## 2018-08-28 DIAGNOSIS — H5211 Myopia, right eye: Secondary | ICD-10-CM | POA: Diagnosis not present

## 2018-08-28 DIAGNOSIS — H04123 Dry eye syndrome of bilateral lacrimal glands: Secondary | ICD-10-CM | POA: Diagnosis not present

## 2018-08-28 DIAGNOSIS — H5202 Hypermetropia, left eye: Secondary | ICD-10-CM | POA: Diagnosis not present

## 2018-08-28 DIAGNOSIS — H2513 Age-related nuclear cataract, bilateral: Secondary | ICD-10-CM | POA: Diagnosis not present

## 2018-08-28 DIAGNOSIS — H524 Presbyopia: Secondary | ICD-10-CM | POA: Diagnosis not present

## 2018-08-28 DIAGNOSIS — H40023 Open angle with borderline findings, high risk, bilateral: Secondary | ICD-10-CM | POA: Diagnosis not present

## 2018-08-28 DIAGNOSIS — H35033 Hypertensive retinopathy, bilateral: Secondary | ICD-10-CM | POA: Diagnosis not present

## 2018-08-29 DIAGNOSIS — N2581 Secondary hyperparathyroidism of renal origin: Secondary | ICD-10-CM | POA: Diagnosis not present

## 2018-08-29 DIAGNOSIS — E1129 Type 2 diabetes mellitus with other diabetic kidney complication: Secondary | ICD-10-CM | POA: Diagnosis not present

## 2018-08-29 DIAGNOSIS — N186 End stage renal disease: Secondary | ICD-10-CM | POA: Diagnosis not present

## 2018-08-29 DIAGNOSIS — D631 Anemia in chronic kidney disease: Secondary | ICD-10-CM | POA: Diagnosis not present

## 2018-08-29 DIAGNOSIS — Z992 Dependence on renal dialysis: Secondary | ICD-10-CM | POA: Diagnosis not present

## 2018-09-02 DIAGNOSIS — Z992 Dependence on renal dialysis: Secondary | ICD-10-CM | POA: Diagnosis not present

## 2018-09-02 DIAGNOSIS — E1129 Type 2 diabetes mellitus with other diabetic kidney complication: Secondary | ICD-10-CM | POA: Diagnosis not present

## 2018-09-02 DIAGNOSIS — N186 End stage renal disease: Secondary | ICD-10-CM | POA: Diagnosis not present

## 2018-09-02 DIAGNOSIS — D631 Anemia in chronic kidney disease: Secondary | ICD-10-CM | POA: Diagnosis not present

## 2018-09-02 DIAGNOSIS — N2581 Secondary hyperparathyroidism of renal origin: Secondary | ICD-10-CM | POA: Diagnosis not present

## 2018-09-03 DIAGNOSIS — N186 End stage renal disease: Secondary | ICD-10-CM | POA: Diagnosis not present

## 2018-09-03 DIAGNOSIS — D631 Anemia in chronic kidney disease: Secondary | ICD-10-CM | POA: Diagnosis not present

## 2018-09-03 DIAGNOSIS — N2581 Secondary hyperparathyroidism of renal origin: Secondary | ICD-10-CM | POA: Diagnosis not present

## 2018-09-03 DIAGNOSIS — E1129 Type 2 diabetes mellitus with other diabetic kidney complication: Secondary | ICD-10-CM | POA: Diagnosis not present

## 2018-09-03 DIAGNOSIS — Z992 Dependence on renal dialysis: Secondary | ICD-10-CM | POA: Diagnosis not present

## 2018-09-06 DIAGNOSIS — N2581 Secondary hyperparathyroidism of renal origin: Secondary | ICD-10-CM | POA: Diagnosis not present

## 2018-09-06 DIAGNOSIS — D631 Anemia in chronic kidney disease: Secondary | ICD-10-CM | POA: Diagnosis not present

## 2018-09-06 DIAGNOSIS — N186 End stage renal disease: Secondary | ICD-10-CM | POA: Diagnosis not present

## 2018-09-06 DIAGNOSIS — Z992 Dependence on renal dialysis: Secondary | ICD-10-CM | POA: Diagnosis not present

## 2018-09-06 DIAGNOSIS — E1129 Type 2 diabetes mellitus with other diabetic kidney complication: Secondary | ICD-10-CM | POA: Diagnosis not present

## 2018-09-08 DIAGNOSIS — N186 End stage renal disease: Secondary | ICD-10-CM | POA: Diagnosis not present

## 2018-09-08 DIAGNOSIS — D631 Anemia in chronic kidney disease: Secondary | ICD-10-CM | POA: Diagnosis not present

## 2018-09-08 DIAGNOSIS — N2581 Secondary hyperparathyroidism of renal origin: Secondary | ICD-10-CM | POA: Diagnosis not present

## 2018-09-08 DIAGNOSIS — E1129 Type 2 diabetes mellitus with other diabetic kidney complication: Secondary | ICD-10-CM | POA: Diagnosis not present

## 2018-09-08 DIAGNOSIS — Z992 Dependence on renal dialysis: Secondary | ICD-10-CM | POA: Diagnosis not present

## 2018-09-10 DIAGNOSIS — N186 End stage renal disease: Secondary | ICD-10-CM | POA: Diagnosis not present

## 2018-09-10 DIAGNOSIS — Z992 Dependence on renal dialysis: Secondary | ICD-10-CM | POA: Diagnosis not present

## 2018-09-10 DIAGNOSIS — D631 Anemia in chronic kidney disease: Secondary | ICD-10-CM | POA: Diagnosis not present

## 2018-09-10 DIAGNOSIS — E1129 Type 2 diabetes mellitus with other diabetic kidney complication: Secondary | ICD-10-CM | POA: Diagnosis not present

## 2018-09-10 DIAGNOSIS — N2581 Secondary hyperparathyroidism of renal origin: Secondary | ICD-10-CM | POA: Diagnosis not present

## 2018-09-12 DIAGNOSIS — D631 Anemia in chronic kidney disease: Secondary | ICD-10-CM | POA: Diagnosis not present

## 2018-09-12 DIAGNOSIS — Z992 Dependence on renal dialysis: Secondary | ICD-10-CM | POA: Diagnosis not present

## 2018-09-12 DIAGNOSIS — E1129 Type 2 diabetes mellitus with other diabetic kidney complication: Secondary | ICD-10-CM | POA: Diagnosis not present

## 2018-09-12 DIAGNOSIS — N2581 Secondary hyperparathyroidism of renal origin: Secondary | ICD-10-CM | POA: Diagnosis not present

## 2018-09-12 DIAGNOSIS — N186 End stage renal disease: Secondary | ICD-10-CM | POA: Diagnosis not present

## 2018-09-13 DIAGNOSIS — Z992 Dependence on renal dialysis: Secondary | ICD-10-CM | POA: Diagnosis not present

## 2018-09-13 DIAGNOSIS — I120 Hypertensive chronic kidney disease with stage 5 chronic kidney disease or end stage renal disease: Secondary | ICD-10-CM | POA: Diagnosis not present

## 2018-09-13 DIAGNOSIS — N186 End stage renal disease: Secondary | ICD-10-CM | POA: Diagnosis not present

## 2018-09-15 DIAGNOSIS — N186 End stage renal disease: Secondary | ICD-10-CM | POA: Diagnosis not present

## 2018-09-15 DIAGNOSIS — D631 Anemia in chronic kidney disease: Secondary | ICD-10-CM | POA: Diagnosis not present

## 2018-09-15 DIAGNOSIS — Z992 Dependence on renal dialysis: Secondary | ICD-10-CM | POA: Diagnosis not present

## 2018-09-15 DIAGNOSIS — N2581 Secondary hyperparathyroidism of renal origin: Secondary | ICD-10-CM | POA: Diagnosis not present

## 2018-09-17 DIAGNOSIS — N2581 Secondary hyperparathyroidism of renal origin: Secondary | ICD-10-CM | POA: Diagnosis not present

## 2018-09-17 DIAGNOSIS — N186 End stage renal disease: Secondary | ICD-10-CM | POA: Diagnosis not present

## 2018-09-17 DIAGNOSIS — D631 Anemia in chronic kidney disease: Secondary | ICD-10-CM | POA: Diagnosis not present

## 2018-09-17 DIAGNOSIS — Z992 Dependence on renal dialysis: Secondary | ICD-10-CM | POA: Diagnosis not present

## 2018-09-19 DIAGNOSIS — D631 Anemia in chronic kidney disease: Secondary | ICD-10-CM | POA: Diagnosis not present

## 2018-09-19 DIAGNOSIS — Z992 Dependence on renal dialysis: Secondary | ICD-10-CM | POA: Diagnosis not present

## 2018-09-19 DIAGNOSIS — N186 End stage renal disease: Secondary | ICD-10-CM | POA: Diagnosis not present

## 2018-09-19 DIAGNOSIS — N2581 Secondary hyperparathyroidism of renal origin: Secondary | ICD-10-CM | POA: Diagnosis not present

## 2018-09-22 DIAGNOSIS — N186 End stage renal disease: Secondary | ICD-10-CM | POA: Diagnosis not present

## 2018-09-22 DIAGNOSIS — D631 Anemia in chronic kidney disease: Secondary | ICD-10-CM | POA: Diagnosis not present

## 2018-09-22 DIAGNOSIS — Z992 Dependence on renal dialysis: Secondary | ICD-10-CM | POA: Diagnosis not present

## 2018-09-22 DIAGNOSIS — N2581 Secondary hyperparathyroidism of renal origin: Secondary | ICD-10-CM | POA: Diagnosis not present

## 2018-09-26 DIAGNOSIS — N186 End stage renal disease: Secondary | ICD-10-CM | POA: Diagnosis not present

## 2018-09-26 DIAGNOSIS — N2581 Secondary hyperparathyroidism of renal origin: Secondary | ICD-10-CM | POA: Diagnosis not present

## 2018-09-26 DIAGNOSIS — D631 Anemia in chronic kidney disease: Secondary | ICD-10-CM | POA: Diagnosis not present

## 2018-09-26 DIAGNOSIS — Z992 Dependence on renal dialysis: Secondary | ICD-10-CM | POA: Diagnosis not present

## 2018-09-29 DIAGNOSIS — N5201 Erectile dysfunction due to arterial insufficiency: Secondary | ICD-10-CM | POA: Diagnosis not present

## 2018-09-30 DIAGNOSIS — N186 End stage renal disease: Secondary | ICD-10-CM | POA: Diagnosis not present

## 2018-09-30 DIAGNOSIS — N2581 Secondary hyperparathyroidism of renal origin: Secondary | ICD-10-CM | POA: Diagnosis not present

## 2018-09-30 DIAGNOSIS — Z992 Dependence on renal dialysis: Secondary | ICD-10-CM | POA: Diagnosis not present

## 2018-09-30 DIAGNOSIS — D631 Anemia in chronic kidney disease: Secondary | ICD-10-CM | POA: Diagnosis not present

## 2018-10-01 DIAGNOSIS — Z992 Dependence on renal dialysis: Secondary | ICD-10-CM | POA: Diagnosis not present

## 2018-10-01 DIAGNOSIS — N186 End stage renal disease: Secondary | ICD-10-CM | POA: Diagnosis not present

## 2018-10-01 DIAGNOSIS — D631 Anemia in chronic kidney disease: Secondary | ICD-10-CM | POA: Diagnosis not present

## 2018-10-01 DIAGNOSIS — N2581 Secondary hyperparathyroidism of renal origin: Secondary | ICD-10-CM | POA: Diagnosis not present

## 2018-10-03 DIAGNOSIS — N186 End stage renal disease: Secondary | ICD-10-CM | POA: Diagnosis not present

## 2018-10-03 DIAGNOSIS — Z992 Dependence on renal dialysis: Secondary | ICD-10-CM | POA: Diagnosis not present

## 2018-10-03 DIAGNOSIS — N2581 Secondary hyperparathyroidism of renal origin: Secondary | ICD-10-CM | POA: Diagnosis not present

## 2018-10-03 DIAGNOSIS — D631 Anemia in chronic kidney disease: Secondary | ICD-10-CM | POA: Diagnosis not present

## 2018-10-06 DIAGNOSIS — N2581 Secondary hyperparathyroidism of renal origin: Secondary | ICD-10-CM | POA: Diagnosis not present

## 2018-10-06 DIAGNOSIS — Z992 Dependence on renal dialysis: Secondary | ICD-10-CM | POA: Diagnosis not present

## 2018-10-06 DIAGNOSIS — D631 Anemia in chronic kidney disease: Secondary | ICD-10-CM | POA: Diagnosis not present

## 2018-10-06 DIAGNOSIS — N186 End stage renal disease: Secondary | ICD-10-CM | POA: Diagnosis not present

## 2018-10-09 DIAGNOSIS — Z992 Dependence on renal dialysis: Secondary | ICD-10-CM | POA: Diagnosis not present

## 2018-10-09 DIAGNOSIS — D631 Anemia in chronic kidney disease: Secondary | ICD-10-CM | POA: Diagnosis not present

## 2018-10-09 DIAGNOSIS — N186 End stage renal disease: Secondary | ICD-10-CM | POA: Diagnosis not present

## 2018-10-09 DIAGNOSIS — N2581 Secondary hyperparathyroidism of renal origin: Secondary | ICD-10-CM | POA: Diagnosis not present

## 2018-10-10 DIAGNOSIS — N2581 Secondary hyperparathyroidism of renal origin: Secondary | ICD-10-CM | POA: Diagnosis not present

## 2018-10-10 DIAGNOSIS — Z992 Dependence on renal dialysis: Secondary | ICD-10-CM | POA: Diagnosis not present

## 2018-10-10 DIAGNOSIS — D631 Anemia in chronic kidney disease: Secondary | ICD-10-CM | POA: Diagnosis not present

## 2018-10-10 DIAGNOSIS — N186 End stage renal disease: Secondary | ICD-10-CM | POA: Diagnosis not present

## 2018-10-13 DIAGNOSIS — N2581 Secondary hyperparathyroidism of renal origin: Secondary | ICD-10-CM | POA: Diagnosis not present

## 2018-10-13 DIAGNOSIS — N186 End stage renal disease: Secondary | ICD-10-CM | POA: Diagnosis not present

## 2018-10-13 DIAGNOSIS — Z992 Dependence on renal dialysis: Secondary | ICD-10-CM | POA: Diagnosis not present

## 2018-10-13 DIAGNOSIS — D631 Anemia in chronic kidney disease: Secondary | ICD-10-CM | POA: Diagnosis not present

## 2018-10-14 DIAGNOSIS — I120 Hypertensive chronic kidney disease with stage 5 chronic kidney disease or end stage renal disease: Secondary | ICD-10-CM | POA: Diagnosis not present

## 2018-10-14 DIAGNOSIS — N186 End stage renal disease: Secondary | ICD-10-CM | POA: Diagnosis not present

## 2018-10-14 DIAGNOSIS — Z992 Dependence on renal dialysis: Secondary | ICD-10-CM | POA: Diagnosis not present

## 2018-10-15 DIAGNOSIS — N186 End stage renal disease: Secondary | ICD-10-CM | POA: Diagnosis not present

## 2018-10-15 DIAGNOSIS — E1129 Type 2 diabetes mellitus with other diabetic kidney complication: Secondary | ICD-10-CM | POA: Diagnosis not present

## 2018-10-15 DIAGNOSIS — N2581 Secondary hyperparathyroidism of renal origin: Secondary | ICD-10-CM | POA: Diagnosis not present

## 2018-10-15 DIAGNOSIS — D631 Anemia in chronic kidney disease: Secondary | ICD-10-CM | POA: Diagnosis not present

## 2018-10-15 DIAGNOSIS — Z23 Encounter for immunization: Secondary | ICD-10-CM | POA: Diagnosis not present

## 2018-10-15 DIAGNOSIS — Z992 Dependence on renal dialysis: Secondary | ICD-10-CM | POA: Diagnosis not present

## 2018-10-17 DIAGNOSIS — E1129 Type 2 diabetes mellitus with other diabetic kidney complication: Secondary | ICD-10-CM | POA: Diagnosis not present

## 2018-10-17 DIAGNOSIS — Z992 Dependence on renal dialysis: Secondary | ICD-10-CM | POA: Diagnosis not present

## 2018-10-17 DIAGNOSIS — D631 Anemia in chronic kidney disease: Secondary | ICD-10-CM | POA: Diagnosis not present

## 2018-10-17 DIAGNOSIS — Z23 Encounter for immunization: Secondary | ICD-10-CM | POA: Diagnosis not present

## 2018-10-17 DIAGNOSIS — N2581 Secondary hyperparathyroidism of renal origin: Secondary | ICD-10-CM | POA: Diagnosis not present

## 2018-10-17 DIAGNOSIS — N186 End stage renal disease: Secondary | ICD-10-CM | POA: Diagnosis not present

## 2018-10-20 DIAGNOSIS — N186 End stage renal disease: Secondary | ICD-10-CM | POA: Diagnosis not present

## 2018-10-20 DIAGNOSIS — E1129 Type 2 diabetes mellitus with other diabetic kidney complication: Secondary | ICD-10-CM | POA: Diagnosis not present

## 2018-10-20 DIAGNOSIS — Z992 Dependence on renal dialysis: Secondary | ICD-10-CM | POA: Diagnosis not present

## 2018-10-20 DIAGNOSIS — D631 Anemia in chronic kidney disease: Secondary | ICD-10-CM | POA: Diagnosis not present

## 2018-10-20 DIAGNOSIS — N2581 Secondary hyperparathyroidism of renal origin: Secondary | ICD-10-CM | POA: Diagnosis not present

## 2018-10-20 DIAGNOSIS — Z23 Encounter for immunization: Secondary | ICD-10-CM | POA: Diagnosis not present

## 2018-10-22 DIAGNOSIS — Z23 Encounter for immunization: Secondary | ICD-10-CM | POA: Diagnosis not present

## 2018-10-22 DIAGNOSIS — E1129 Type 2 diabetes mellitus with other diabetic kidney complication: Secondary | ICD-10-CM | POA: Diagnosis not present

## 2018-10-22 DIAGNOSIS — Z992 Dependence on renal dialysis: Secondary | ICD-10-CM | POA: Diagnosis not present

## 2018-10-22 DIAGNOSIS — N2581 Secondary hyperparathyroidism of renal origin: Secondary | ICD-10-CM | POA: Diagnosis not present

## 2018-10-22 DIAGNOSIS — D631 Anemia in chronic kidney disease: Secondary | ICD-10-CM | POA: Diagnosis not present

## 2018-10-22 DIAGNOSIS — N186 End stage renal disease: Secondary | ICD-10-CM | POA: Diagnosis not present

## 2018-10-24 DIAGNOSIS — N2581 Secondary hyperparathyroidism of renal origin: Secondary | ICD-10-CM | POA: Diagnosis not present

## 2018-10-24 DIAGNOSIS — D631 Anemia in chronic kidney disease: Secondary | ICD-10-CM | POA: Diagnosis not present

## 2018-10-24 DIAGNOSIS — N186 End stage renal disease: Secondary | ICD-10-CM | POA: Diagnosis not present

## 2018-10-24 DIAGNOSIS — Z992 Dependence on renal dialysis: Secondary | ICD-10-CM | POA: Diagnosis not present

## 2018-10-24 DIAGNOSIS — E1129 Type 2 diabetes mellitus with other diabetic kidney complication: Secondary | ICD-10-CM | POA: Diagnosis not present

## 2018-10-24 DIAGNOSIS — Z23 Encounter for immunization: Secondary | ICD-10-CM | POA: Diagnosis not present

## 2018-10-28 DIAGNOSIS — N5201 Erectile dysfunction due to arterial insufficiency: Secondary | ICD-10-CM | POA: Diagnosis not present

## 2018-10-28 DIAGNOSIS — Z23 Encounter for immunization: Secondary | ICD-10-CM | POA: Diagnosis not present

## 2018-10-28 DIAGNOSIS — N2581 Secondary hyperparathyroidism of renal origin: Secondary | ICD-10-CM | POA: Diagnosis not present

## 2018-10-28 DIAGNOSIS — N186 End stage renal disease: Secondary | ICD-10-CM | POA: Diagnosis not present

## 2018-10-28 DIAGNOSIS — E1129 Type 2 diabetes mellitus with other diabetic kidney complication: Secondary | ICD-10-CM | POA: Diagnosis not present

## 2018-10-28 DIAGNOSIS — D631 Anemia in chronic kidney disease: Secondary | ICD-10-CM | POA: Diagnosis not present

## 2018-10-28 DIAGNOSIS — Z992 Dependence on renal dialysis: Secondary | ICD-10-CM | POA: Diagnosis not present

## 2018-10-29 DIAGNOSIS — D631 Anemia in chronic kidney disease: Secondary | ICD-10-CM | POA: Diagnosis not present

## 2018-10-29 DIAGNOSIS — Z992 Dependence on renal dialysis: Secondary | ICD-10-CM | POA: Diagnosis not present

## 2018-10-29 DIAGNOSIS — N2581 Secondary hyperparathyroidism of renal origin: Secondary | ICD-10-CM | POA: Diagnosis not present

## 2018-10-29 DIAGNOSIS — N186 End stage renal disease: Secondary | ICD-10-CM | POA: Diagnosis not present

## 2018-10-29 DIAGNOSIS — E1129 Type 2 diabetes mellitus with other diabetic kidney complication: Secondary | ICD-10-CM | POA: Diagnosis not present

## 2018-10-29 DIAGNOSIS — Z23 Encounter for immunization: Secondary | ICD-10-CM | POA: Diagnosis not present

## 2018-10-31 DIAGNOSIS — E1129 Type 2 diabetes mellitus with other diabetic kidney complication: Secondary | ICD-10-CM | POA: Diagnosis not present

## 2018-10-31 DIAGNOSIS — D631 Anemia in chronic kidney disease: Secondary | ICD-10-CM | POA: Diagnosis not present

## 2018-10-31 DIAGNOSIS — N186 End stage renal disease: Secondary | ICD-10-CM | POA: Diagnosis not present

## 2018-10-31 DIAGNOSIS — N2581 Secondary hyperparathyroidism of renal origin: Secondary | ICD-10-CM | POA: Diagnosis not present

## 2018-10-31 DIAGNOSIS — Z992 Dependence on renal dialysis: Secondary | ICD-10-CM | POA: Diagnosis not present

## 2018-10-31 DIAGNOSIS — Z23 Encounter for immunization: Secondary | ICD-10-CM | POA: Diagnosis not present

## 2018-11-03 DIAGNOSIS — Z23 Encounter for immunization: Secondary | ICD-10-CM | POA: Diagnosis not present

## 2018-11-03 DIAGNOSIS — N186 End stage renal disease: Secondary | ICD-10-CM | POA: Diagnosis not present

## 2018-11-03 DIAGNOSIS — D631 Anemia in chronic kidney disease: Secondary | ICD-10-CM | POA: Diagnosis not present

## 2018-11-03 DIAGNOSIS — E1129 Type 2 diabetes mellitus with other diabetic kidney complication: Secondary | ICD-10-CM | POA: Diagnosis not present

## 2018-11-03 DIAGNOSIS — N2581 Secondary hyperparathyroidism of renal origin: Secondary | ICD-10-CM | POA: Diagnosis not present

## 2018-11-03 DIAGNOSIS — Z992 Dependence on renal dialysis: Secondary | ICD-10-CM | POA: Diagnosis not present

## 2018-11-05 DIAGNOSIS — Z23 Encounter for immunization: Secondary | ICD-10-CM | POA: Diagnosis not present

## 2018-11-05 DIAGNOSIS — D631 Anemia in chronic kidney disease: Secondary | ICD-10-CM | POA: Diagnosis not present

## 2018-11-05 DIAGNOSIS — N186 End stage renal disease: Secondary | ICD-10-CM | POA: Diagnosis not present

## 2018-11-05 DIAGNOSIS — E1129 Type 2 diabetes mellitus with other diabetic kidney complication: Secondary | ICD-10-CM | POA: Diagnosis not present

## 2018-11-05 DIAGNOSIS — N2581 Secondary hyperparathyroidism of renal origin: Secondary | ICD-10-CM | POA: Diagnosis not present

## 2018-11-05 DIAGNOSIS — Z992 Dependence on renal dialysis: Secondary | ICD-10-CM | POA: Diagnosis not present

## 2018-11-07 DIAGNOSIS — Z992 Dependence on renal dialysis: Secondary | ICD-10-CM | POA: Diagnosis not present

## 2018-11-07 DIAGNOSIS — N186 End stage renal disease: Secondary | ICD-10-CM | POA: Diagnosis not present

## 2018-11-07 DIAGNOSIS — N2581 Secondary hyperparathyroidism of renal origin: Secondary | ICD-10-CM | POA: Diagnosis not present

## 2018-11-07 DIAGNOSIS — Z23 Encounter for immunization: Secondary | ICD-10-CM | POA: Diagnosis not present

## 2018-11-07 DIAGNOSIS — E1129 Type 2 diabetes mellitus with other diabetic kidney complication: Secondary | ICD-10-CM | POA: Diagnosis not present

## 2018-11-07 DIAGNOSIS — D631 Anemia in chronic kidney disease: Secondary | ICD-10-CM | POA: Diagnosis not present

## 2018-11-10 DIAGNOSIS — N186 End stage renal disease: Secondary | ICD-10-CM | POA: Diagnosis not present

## 2018-11-10 DIAGNOSIS — N2581 Secondary hyperparathyroidism of renal origin: Secondary | ICD-10-CM | POA: Diagnosis not present

## 2018-11-10 DIAGNOSIS — Z992 Dependence on renal dialysis: Secondary | ICD-10-CM | POA: Diagnosis not present

## 2018-11-10 DIAGNOSIS — D631 Anemia in chronic kidney disease: Secondary | ICD-10-CM | POA: Diagnosis not present

## 2018-11-10 DIAGNOSIS — Z23 Encounter for immunization: Secondary | ICD-10-CM | POA: Diagnosis not present

## 2018-11-10 DIAGNOSIS — E1129 Type 2 diabetes mellitus with other diabetic kidney complication: Secondary | ICD-10-CM | POA: Diagnosis not present

## 2018-11-12 DIAGNOSIS — D631 Anemia in chronic kidney disease: Secondary | ICD-10-CM | POA: Diagnosis not present

## 2018-11-12 DIAGNOSIS — N2581 Secondary hyperparathyroidism of renal origin: Secondary | ICD-10-CM | POA: Diagnosis not present

## 2018-11-12 DIAGNOSIS — Z992 Dependence on renal dialysis: Secondary | ICD-10-CM | POA: Diagnosis not present

## 2018-11-12 DIAGNOSIS — N186 End stage renal disease: Secondary | ICD-10-CM | POA: Diagnosis not present

## 2018-11-12 DIAGNOSIS — Z23 Encounter for immunization: Secondary | ICD-10-CM | POA: Diagnosis not present

## 2018-11-12 DIAGNOSIS — E1129 Type 2 diabetes mellitus with other diabetic kidney complication: Secondary | ICD-10-CM | POA: Diagnosis not present

## 2018-11-13 DIAGNOSIS — I120 Hypertensive chronic kidney disease with stage 5 chronic kidney disease or end stage renal disease: Secondary | ICD-10-CM | POA: Diagnosis not present

## 2018-11-13 DIAGNOSIS — Z992 Dependence on renal dialysis: Secondary | ICD-10-CM | POA: Diagnosis not present

## 2018-11-13 DIAGNOSIS — N186 End stage renal disease: Secondary | ICD-10-CM | POA: Diagnosis not present

## 2018-11-14 DIAGNOSIS — Z79899 Other long term (current) drug therapy: Secondary | ICD-10-CM | POA: Diagnosis not present

## 2018-11-14 DIAGNOSIS — D631 Anemia in chronic kidney disease: Secondary | ICD-10-CM | POA: Diagnosis not present

## 2018-11-14 DIAGNOSIS — Z992 Dependence on renal dialysis: Secondary | ICD-10-CM | POA: Diagnosis not present

## 2018-11-14 DIAGNOSIS — N2581 Secondary hyperparathyroidism of renal origin: Secondary | ICD-10-CM | POA: Diagnosis not present

## 2018-11-14 DIAGNOSIS — D509 Iron deficiency anemia, unspecified: Secondary | ICD-10-CM | POA: Diagnosis not present

## 2018-11-14 DIAGNOSIS — N186 End stage renal disease: Secondary | ICD-10-CM | POA: Diagnosis not present

## 2018-11-15 DIAGNOSIS — M62830 Muscle spasm of back: Secondary | ICD-10-CM | POA: Diagnosis not present

## 2018-11-15 DIAGNOSIS — M129 Arthropathy, unspecified: Secondary | ICD-10-CM | POA: Diagnosis not present

## 2018-11-15 DIAGNOSIS — R109 Unspecified abdominal pain: Secondary | ICD-10-CM | POA: Diagnosis not present

## 2018-11-15 DIAGNOSIS — Z992 Dependence on renal dialysis: Secondary | ICD-10-CM | POA: Diagnosis not present

## 2018-11-15 DIAGNOSIS — M25551 Pain in right hip: Secondary | ICD-10-CM | POA: Diagnosis not present

## 2018-11-15 DIAGNOSIS — Z1159 Encounter for screening for other viral diseases: Secondary | ICD-10-CM | POA: Diagnosis not present

## 2018-11-18 DIAGNOSIS — N2581 Secondary hyperparathyroidism of renal origin: Secondary | ICD-10-CM | POA: Diagnosis not present

## 2018-11-18 DIAGNOSIS — D509 Iron deficiency anemia, unspecified: Secondary | ICD-10-CM | POA: Diagnosis not present

## 2018-11-18 DIAGNOSIS — D631 Anemia in chronic kidney disease: Secondary | ICD-10-CM | POA: Diagnosis not present

## 2018-11-18 DIAGNOSIS — N186 End stage renal disease: Secondary | ICD-10-CM | POA: Diagnosis not present

## 2018-11-18 DIAGNOSIS — Z992 Dependence on renal dialysis: Secondary | ICD-10-CM | POA: Diagnosis not present

## 2018-11-18 DIAGNOSIS — Z79899 Other long term (current) drug therapy: Secondary | ICD-10-CM | POA: Diagnosis not present

## 2018-11-19 DIAGNOSIS — M129 Arthropathy, unspecified: Secondary | ICD-10-CM | POA: Diagnosis not present

## 2018-11-19 DIAGNOSIS — N2581 Secondary hyperparathyroidism of renal origin: Secondary | ICD-10-CM | POA: Diagnosis not present

## 2018-11-19 DIAGNOSIS — D631 Anemia in chronic kidney disease: Secondary | ICD-10-CM | POA: Diagnosis not present

## 2018-11-19 DIAGNOSIS — N186 End stage renal disease: Secondary | ICD-10-CM | POA: Diagnosis not present

## 2018-11-19 DIAGNOSIS — Z79899 Other long term (current) drug therapy: Secondary | ICD-10-CM | POA: Diagnosis not present

## 2018-11-19 DIAGNOSIS — D509 Iron deficiency anemia, unspecified: Secondary | ICD-10-CM | POA: Diagnosis not present

## 2018-11-19 DIAGNOSIS — E1129 Type 2 diabetes mellitus with other diabetic kidney complication: Secondary | ICD-10-CM | POA: Diagnosis not present

## 2018-11-19 DIAGNOSIS — Z992 Dependence on renal dialysis: Secondary | ICD-10-CM | POA: Diagnosis not present

## 2018-11-21 DIAGNOSIS — D631 Anemia in chronic kidney disease: Secondary | ICD-10-CM | POA: Diagnosis not present

## 2018-11-21 DIAGNOSIS — M129 Arthropathy, unspecified: Secondary | ICD-10-CM | POA: Diagnosis not present

## 2018-11-21 DIAGNOSIS — Z992 Dependence on renal dialysis: Secondary | ICD-10-CM | POA: Diagnosis not present

## 2018-11-21 DIAGNOSIS — N186 End stage renal disease: Secondary | ICD-10-CM | POA: Diagnosis not present

## 2018-11-21 DIAGNOSIS — Z79899 Other long term (current) drug therapy: Secondary | ICD-10-CM | POA: Diagnosis not present

## 2018-11-21 DIAGNOSIS — D509 Iron deficiency anemia, unspecified: Secondary | ICD-10-CM | POA: Diagnosis not present

## 2018-11-21 DIAGNOSIS — N2581 Secondary hyperparathyroidism of renal origin: Secondary | ICD-10-CM | POA: Diagnosis not present

## 2018-11-24 DIAGNOSIS — Z79899 Other long term (current) drug therapy: Secondary | ICD-10-CM | POA: Diagnosis not present

## 2018-11-24 DIAGNOSIS — N186 End stage renal disease: Secondary | ICD-10-CM | POA: Diagnosis not present

## 2018-11-24 DIAGNOSIS — D509 Iron deficiency anemia, unspecified: Secondary | ICD-10-CM | POA: Diagnosis not present

## 2018-11-24 DIAGNOSIS — N2581 Secondary hyperparathyroidism of renal origin: Secondary | ICD-10-CM | POA: Diagnosis not present

## 2018-11-24 DIAGNOSIS — Z992 Dependence on renal dialysis: Secondary | ICD-10-CM | POA: Diagnosis not present

## 2018-11-24 DIAGNOSIS — D631 Anemia in chronic kidney disease: Secondary | ICD-10-CM | POA: Diagnosis not present

## 2018-11-26 DIAGNOSIS — Z79899 Other long term (current) drug therapy: Secondary | ICD-10-CM | POA: Diagnosis not present

## 2018-11-26 DIAGNOSIS — N2581 Secondary hyperparathyroidism of renal origin: Secondary | ICD-10-CM | POA: Diagnosis not present

## 2018-11-26 DIAGNOSIS — Z992 Dependence on renal dialysis: Secondary | ICD-10-CM | POA: Diagnosis not present

## 2018-11-26 DIAGNOSIS — D631 Anemia in chronic kidney disease: Secondary | ICD-10-CM | POA: Diagnosis not present

## 2018-11-26 DIAGNOSIS — D509 Iron deficiency anemia, unspecified: Secondary | ICD-10-CM | POA: Diagnosis not present

## 2018-11-26 DIAGNOSIS — N186 End stage renal disease: Secondary | ICD-10-CM | POA: Diagnosis not present

## 2018-11-28 DIAGNOSIS — Z79899 Other long term (current) drug therapy: Secondary | ICD-10-CM | POA: Diagnosis not present

## 2018-11-28 DIAGNOSIS — N186 End stage renal disease: Secondary | ICD-10-CM | POA: Diagnosis not present

## 2018-11-28 DIAGNOSIS — D631 Anemia in chronic kidney disease: Secondary | ICD-10-CM | POA: Diagnosis not present

## 2018-11-28 DIAGNOSIS — D509 Iron deficiency anemia, unspecified: Secondary | ICD-10-CM | POA: Diagnosis not present

## 2018-11-28 DIAGNOSIS — N2581 Secondary hyperparathyroidism of renal origin: Secondary | ICD-10-CM | POA: Diagnosis not present

## 2018-11-28 DIAGNOSIS — Z992 Dependence on renal dialysis: Secondary | ICD-10-CM | POA: Diagnosis not present

## 2018-12-01 DIAGNOSIS — Z992 Dependence on renal dialysis: Secondary | ICD-10-CM | POA: Diagnosis not present

## 2018-12-01 DIAGNOSIS — D509 Iron deficiency anemia, unspecified: Secondary | ICD-10-CM | POA: Diagnosis not present

## 2018-12-01 DIAGNOSIS — Z79899 Other long term (current) drug therapy: Secondary | ICD-10-CM | POA: Diagnosis not present

## 2018-12-01 DIAGNOSIS — N186 End stage renal disease: Secondary | ICD-10-CM | POA: Diagnosis not present

## 2018-12-01 DIAGNOSIS — D631 Anemia in chronic kidney disease: Secondary | ICD-10-CM | POA: Diagnosis not present

## 2018-12-01 DIAGNOSIS — N2581 Secondary hyperparathyroidism of renal origin: Secondary | ICD-10-CM | POA: Diagnosis not present

## 2018-12-03 DIAGNOSIS — D631 Anemia in chronic kidney disease: Secondary | ICD-10-CM | POA: Diagnosis not present

## 2018-12-03 DIAGNOSIS — N186 End stage renal disease: Secondary | ICD-10-CM | POA: Diagnosis not present

## 2018-12-03 DIAGNOSIS — Z992 Dependence on renal dialysis: Secondary | ICD-10-CM | POA: Diagnosis not present

## 2018-12-03 DIAGNOSIS — N2581 Secondary hyperparathyroidism of renal origin: Secondary | ICD-10-CM | POA: Diagnosis not present

## 2018-12-03 DIAGNOSIS — Z79899 Other long term (current) drug therapy: Secondary | ICD-10-CM | POA: Diagnosis not present

## 2018-12-03 DIAGNOSIS — D509 Iron deficiency anemia, unspecified: Secondary | ICD-10-CM | POA: Diagnosis not present

## 2018-12-05 DIAGNOSIS — Z992 Dependence on renal dialysis: Secondary | ICD-10-CM | POA: Diagnosis not present

## 2018-12-05 DIAGNOSIS — D509 Iron deficiency anemia, unspecified: Secondary | ICD-10-CM | POA: Diagnosis not present

## 2018-12-05 DIAGNOSIS — D631 Anemia in chronic kidney disease: Secondary | ICD-10-CM | POA: Diagnosis not present

## 2018-12-05 DIAGNOSIS — Z79899 Other long term (current) drug therapy: Secondary | ICD-10-CM | POA: Diagnosis not present

## 2018-12-05 DIAGNOSIS — N186 End stage renal disease: Secondary | ICD-10-CM | POA: Diagnosis not present

## 2018-12-05 DIAGNOSIS — N2581 Secondary hyperparathyroidism of renal origin: Secondary | ICD-10-CM | POA: Diagnosis not present

## 2018-12-10 DIAGNOSIS — N186 End stage renal disease: Secondary | ICD-10-CM | POA: Diagnosis not present

## 2018-12-10 DIAGNOSIS — D509 Iron deficiency anemia, unspecified: Secondary | ICD-10-CM | POA: Diagnosis not present

## 2018-12-10 DIAGNOSIS — Z992 Dependence on renal dialysis: Secondary | ICD-10-CM | POA: Diagnosis not present

## 2018-12-10 DIAGNOSIS — Z79899 Other long term (current) drug therapy: Secondary | ICD-10-CM | POA: Diagnosis not present

## 2018-12-10 DIAGNOSIS — N2581 Secondary hyperparathyroidism of renal origin: Secondary | ICD-10-CM | POA: Diagnosis not present

## 2018-12-10 DIAGNOSIS — D631 Anemia in chronic kidney disease: Secondary | ICD-10-CM | POA: Diagnosis not present

## 2018-12-12 DIAGNOSIS — D509 Iron deficiency anemia, unspecified: Secondary | ICD-10-CM | POA: Diagnosis not present

## 2018-12-12 DIAGNOSIS — N2581 Secondary hyperparathyroidism of renal origin: Secondary | ICD-10-CM | POA: Diagnosis not present

## 2018-12-12 DIAGNOSIS — D631 Anemia in chronic kidney disease: Secondary | ICD-10-CM | POA: Diagnosis not present

## 2018-12-12 DIAGNOSIS — N186 End stage renal disease: Secondary | ICD-10-CM | POA: Diagnosis not present

## 2018-12-12 DIAGNOSIS — Z79899 Other long term (current) drug therapy: Secondary | ICD-10-CM | POA: Diagnosis not present

## 2018-12-12 DIAGNOSIS — Z992 Dependence on renal dialysis: Secondary | ICD-10-CM | POA: Diagnosis not present

## 2018-12-14 DIAGNOSIS — I120 Hypertensive chronic kidney disease with stage 5 chronic kidney disease or end stage renal disease: Secondary | ICD-10-CM | POA: Diagnosis not present

## 2018-12-14 DIAGNOSIS — Z992 Dependence on renal dialysis: Secondary | ICD-10-CM | POA: Diagnosis not present

## 2018-12-14 DIAGNOSIS — N186 End stage renal disease: Secondary | ICD-10-CM | POA: Diagnosis not present

## 2018-12-15 DIAGNOSIS — N2581 Secondary hyperparathyroidism of renal origin: Secondary | ICD-10-CM | POA: Diagnosis not present

## 2018-12-15 DIAGNOSIS — N186 End stage renal disease: Secondary | ICD-10-CM | POA: Diagnosis not present

## 2018-12-15 DIAGNOSIS — D631 Anemia in chronic kidney disease: Secondary | ICD-10-CM | POA: Diagnosis not present

## 2018-12-15 DIAGNOSIS — D509 Iron deficiency anemia, unspecified: Secondary | ICD-10-CM | POA: Diagnosis not present

## 2018-12-15 DIAGNOSIS — Z992 Dependence on renal dialysis: Secondary | ICD-10-CM | POA: Diagnosis not present

## 2018-12-17 DIAGNOSIS — D631 Anemia in chronic kidney disease: Secondary | ICD-10-CM | POA: Diagnosis not present

## 2018-12-17 DIAGNOSIS — D509 Iron deficiency anemia, unspecified: Secondary | ICD-10-CM | POA: Diagnosis not present

## 2018-12-17 DIAGNOSIS — N186 End stage renal disease: Secondary | ICD-10-CM | POA: Diagnosis not present

## 2018-12-17 DIAGNOSIS — Z992 Dependence on renal dialysis: Secondary | ICD-10-CM | POA: Diagnosis not present

## 2018-12-17 DIAGNOSIS — N2581 Secondary hyperparathyroidism of renal origin: Secondary | ICD-10-CM | POA: Diagnosis not present

## 2018-12-19 DIAGNOSIS — N186 End stage renal disease: Secondary | ICD-10-CM | POA: Diagnosis not present

## 2018-12-19 DIAGNOSIS — D509 Iron deficiency anemia, unspecified: Secondary | ICD-10-CM | POA: Diagnosis not present

## 2018-12-19 DIAGNOSIS — N2581 Secondary hyperparathyroidism of renal origin: Secondary | ICD-10-CM | POA: Diagnosis not present

## 2018-12-19 DIAGNOSIS — Z992 Dependence on renal dialysis: Secondary | ICD-10-CM | POA: Diagnosis not present

## 2018-12-19 DIAGNOSIS — D631 Anemia in chronic kidney disease: Secondary | ICD-10-CM | POA: Diagnosis not present

## 2018-12-22 DIAGNOSIS — D509 Iron deficiency anemia, unspecified: Secondary | ICD-10-CM | POA: Diagnosis not present

## 2018-12-22 DIAGNOSIS — N2581 Secondary hyperparathyroidism of renal origin: Secondary | ICD-10-CM | POA: Diagnosis not present

## 2018-12-22 DIAGNOSIS — N186 End stage renal disease: Secondary | ICD-10-CM | POA: Diagnosis not present

## 2018-12-22 DIAGNOSIS — Z992 Dependence on renal dialysis: Secondary | ICD-10-CM | POA: Diagnosis not present

## 2018-12-22 DIAGNOSIS — D631 Anemia in chronic kidney disease: Secondary | ICD-10-CM | POA: Diagnosis not present

## 2018-12-24 DIAGNOSIS — N2581 Secondary hyperparathyroidism of renal origin: Secondary | ICD-10-CM | POA: Diagnosis not present

## 2018-12-24 DIAGNOSIS — Z992 Dependence on renal dialysis: Secondary | ICD-10-CM | POA: Diagnosis not present

## 2018-12-24 DIAGNOSIS — N186 End stage renal disease: Secondary | ICD-10-CM | POA: Diagnosis not present

## 2018-12-24 DIAGNOSIS — D509 Iron deficiency anemia, unspecified: Secondary | ICD-10-CM | POA: Diagnosis not present

## 2018-12-24 DIAGNOSIS — D631 Anemia in chronic kidney disease: Secondary | ICD-10-CM | POA: Diagnosis not present

## 2018-12-26 DIAGNOSIS — D509 Iron deficiency anemia, unspecified: Secondary | ICD-10-CM | POA: Diagnosis not present

## 2018-12-26 DIAGNOSIS — N186 End stage renal disease: Secondary | ICD-10-CM | POA: Diagnosis not present

## 2018-12-26 DIAGNOSIS — D631 Anemia in chronic kidney disease: Secondary | ICD-10-CM | POA: Diagnosis not present

## 2018-12-26 DIAGNOSIS — Z992 Dependence on renal dialysis: Secondary | ICD-10-CM | POA: Diagnosis not present

## 2018-12-26 DIAGNOSIS — N2581 Secondary hyperparathyroidism of renal origin: Secondary | ICD-10-CM | POA: Diagnosis not present

## 2018-12-29 DIAGNOSIS — N186 End stage renal disease: Secondary | ICD-10-CM | POA: Diagnosis not present

## 2018-12-29 DIAGNOSIS — D509 Iron deficiency anemia, unspecified: Secondary | ICD-10-CM | POA: Diagnosis not present

## 2018-12-29 DIAGNOSIS — D631 Anemia in chronic kidney disease: Secondary | ICD-10-CM | POA: Diagnosis not present

## 2018-12-29 DIAGNOSIS — Z992 Dependence on renal dialysis: Secondary | ICD-10-CM | POA: Diagnosis not present

## 2018-12-29 DIAGNOSIS — N2581 Secondary hyperparathyroidism of renal origin: Secondary | ICD-10-CM | POA: Diagnosis not present

## 2018-12-31 DIAGNOSIS — D631 Anemia in chronic kidney disease: Secondary | ICD-10-CM | POA: Diagnosis not present

## 2018-12-31 DIAGNOSIS — D509 Iron deficiency anemia, unspecified: Secondary | ICD-10-CM | POA: Diagnosis not present

## 2018-12-31 DIAGNOSIS — N186 End stage renal disease: Secondary | ICD-10-CM | POA: Diagnosis not present

## 2018-12-31 DIAGNOSIS — N2581 Secondary hyperparathyroidism of renal origin: Secondary | ICD-10-CM | POA: Diagnosis not present

## 2018-12-31 DIAGNOSIS — Z992 Dependence on renal dialysis: Secondary | ICD-10-CM | POA: Diagnosis not present

## 2019-01-02 DIAGNOSIS — N2581 Secondary hyperparathyroidism of renal origin: Secondary | ICD-10-CM | POA: Diagnosis not present

## 2019-01-02 DIAGNOSIS — D631 Anemia in chronic kidney disease: Secondary | ICD-10-CM | POA: Diagnosis not present

## 2019-01-02 DIAGNOSIS — Z992 Dependence on renal dialysis: Secondary | ICD-10-CM | POA: Diagnosis not present

## 2019-01-02 DIAGNOSIS — D509 Iron deficiency anemia, unspecified: Secondary | ICD-10-CM | POA: Diagnosis not present

## 2019-01-02 DIAGNOSIS — N186 End stage renal disease: Secondary | ICD-10-CM | POA: Diagnosis not present

## 2019-01-04 ENCOUNTER — Encounter (HOSPITAL_COMMUNITY): Payer: Self-pay

## 2019-01-04 ENCOUNTER — Other Ambulatory Visit: Payer: Self-pay

## 2019-01-04 ENCOUNTER — Ambulatory Visit (HOSPITAL_COMMUNITY)
Admission: EM | Admit: 2019-01-04 | Discharge: 2019-01-04 | Disposition: A | Discharge status: Home / Self Care | Payer: Medicare Other | Attending: Urgent Care | Admitting: Urgent Care

## 2019-01-04 DIAGNOSIS — R509 Fever, unspecified: Secondary | ICD-10-CM | POA: Diagnosis not present

## 2019-01-04 DIAGNOSIS — N186 End stage renal disease: Secondary | ICD-10-CM

## 2019-01-04 LAB — POC SARS CORONAVIRUS 2 AG: SARS Coronavirus 2 Ag: NEGATIVE

## 2019-01-04 LAB — POC SARS CORONAVIRUS 2 AG -  ED: SARS Coronavirus 2 Ag: NEGATIVE

## 2019-01-04 NOTE — ED Triage Notes (Signed)
Patient presents to Urgent Care with complaints of low-grade fever (99. Something) since earlier today while at dialysis. Patient reports he was not able to be dialyzed today due to his low grade temp and sweating, pt states he sweats all the time and that this is his norm.

## 2019-01-04 NOTE — Discharge Instructions (Signed)
Your Covid test was negative in our clinic today.  I believe it is safe for you to resume your dialysis treatments.

## 2019-01-04 NOTE — ED Provider Notes (Signed)
[place]   MRN: [id number] DOB: 12/29/1955  Subjective:   Mano Situ is a 63 y.o. male at the request of his dialysis team.  Patient states that he was told he had a fever, temperature was 99.5 F.  He was redirected here for what I assume was COVID-19 testing.  Patient states that he feels completely fine, states that he was sweating when he was at dialysis and feels like this was from excessive movement.  He is not opposed to getting COVID-19 testing so that he can get his dialysis.  No current facility-administered medications for this encounter.   Current Outpatient Medications:  .  acetaminophen (TYLENOL) 650 MG CR tablet, Take 650 mg by mouth every 8 (eight) hours as needed for pain., Disp: , Rfl:  .  allopurinol (ZYLOPRIM) 100 MG tablet, Take 1 tablet (100 mg total) by mouth 2 (two) times daily., Disp: 60 tablet, Rfl: 0 .  amitriptyline (ELAVIL) 50 MG tablet, Take 1 tablet (50 mg total) by mouth at bedtime., Disp: 30 tablet, Rfl: 0 .  b complex-vitamin c-folic acid (NEPHRO-VITE) 0.8 MG TABS tablet, Take 1 tablet by mouth daily., Disp: , Rfl:  .  cinacalcet (SENSIPAR) 30 MG tablet, Take 30 mg by mouth daily with supper., Disp: , Rfl: 6 .  clindamycin (CLEOCIN T) 1 % external solution, Apply 1 application topically 2 (two) times daily. Apply to face., Disp: , Rfl:  .  Dextromethorphan-Guaifenesin (MUCINEX DM) 30-600 MG TB12, Take 1 tablet by mouth daily as needed (congestion). , Disp: , Rfl:  .  docusate sodium (COLACE) 100 MG capsule, Take 100 mg by mouth daily as needed for mild constipation. , Disp: , Rfl:  .  gabapentin (NEURONTIN) 100 MG capsule, Take 2 capsules (200 mg total) by mouth at bedtime. 200 mg qam and 300 mg upon return from dialysis on M-W-F for neuropathy (Patient taking differently: Take 100-200 mg by mouth See admin instructions. 100mg  in the morning and 200mg  in the evening), Disp: 60 capsule, Rfl: 0 .  lactulose (CHRONULAC) 10 GM/15ML solution, Take 20 g  by mouth 2 (two) times daily as needed for mild constipation., Disp: , Rfl:  .  nystatin cream (MYCOSTATIN), Apply 1 application topically 2 (two) times daily as needed (rash)., Disp: , Rfl:  .  simvastatin (ZOCOR) 20 MG tablet, Take 1 tablet (20 mg total) by mouth daily. (Patient taking differently: Take 20 mg by mouth at bedtime. ), Disp: 30 tablet, Rfl: 0 .  sodium chloride (OCEAN) 0.65 % SOLN nasal spray, Place 1 spray into both nostrils as needed for congestion., Disp: 104 mL, Rfl: 0 .  tretinoin (RETIN-A) 0.05 % cream, Apply 1 application topically at bedtime. Apply to face, Disp: , Rfl:    Allergies  Allergen Reactions  . Tobacco [Tobacco] Shortness Of Breath    Past Medical History:  Diagnosis Date  . Allergic rhinitis 07/07/2014  . Anemia due to other cause 07/07/2014  . Arthritis    "all over"  . Atrial fibrillation ([place]) 12/30/2012  . CHF (congestive heart failure) ([place]) 12/30/2012  . Chronic lower back pain   . Diabetes mellitus with neuropathy ([place])   . Diabetic retinopathy ([place]) 12/30/2012   no medications  . Dysrhythmia    Afib  . ESRD (end stage renal disease) on dialysis ([place]) started in 2013   MWF; [facility]; [facility]  . GERD (gastroesophageal reflux disease)   . Glaucoma   . History of gout    "right big toe"  .  Hyperlipidemia   . Hypertension   . Hypothyroid    01/17/16- no longer on meduication  . Morbid obesity ([place]) 12/30/2012  . Myocardial infarction ([place])    "I've had ~ 3; last one was in ~ 10/2013" (01/30/2014)  . OSA (obstructive sleep apnea) 12/30/2012   "lost weight; no longer needed CPAP; retested said I needed it; didn't followup cause I was feeling fine" (01/30/2014)  . Pulmonary hypertension ([place]) 12/30/2012  . Refusal of blood transfusions as patient is Jehovah's Witness   . Stroke [facility]) ~ 2005; ~ 2005   "they were mild; I didn't even notice I'd had them"; denies residual on 01/30/2014  . Subdural hematoma (Oct 2018)    Coumadin and  heparin placed on hold at dc- pt did not FU w/ NS as recommended after dc so not on warfarin when admitted 04/03/17     Past Surgical History:  Procedure Laterality Date  . AV FISTULA PLACEMENT Left ~ 10/2013   "forearm"  . CARDIAC CATHETERIZATION N/A 01/07/2015   Procedure: Left Heart Cath and Coronary Angiography;  Surgeon: Troy Sine, MD;  Location: [place] [facility];  Service: Cardiovascular;  Laterality: N/A;  . CLOSED REDUCTION HIP DISLOCATION Right 1970's  . JOINT REPLACEMENT    . TOTAL HIP ARTHROPLASTY Right 1980  . TOTAL KNEE ARTHROPLASTY Right 01/24/2016   Procedure: TOTAL KNEE ARTHROPLASTY;  Surgeon: Dorna Leitz, MD;  Location: [place];  Service: Orthopedics;  Laterality: Right;  . TOTAL KNEE ARTHROPLASTY Left 06/26/2016   Procedure: TOTAL KNEE ARTHROPLASTY;  Surgeon: Dorna Leitz, MD;  Location: [place];  Service: Orthopedics;  Laterality: Left;    Family History  Problem Relation Age of Onset  . Arthritis Other   . Heart disease Other   . Hyperlipidemia Other   . Hypertension Other   . Kidney disease Other   . Diabetes Other   . Stroke Other   . Diabetes Mother   . Cancer Father        bone cancer  . Heart disease Father   . Diabetes Sister   . Diabetes Brother   . Stroke Brother     Social History   Tobacco Use  . Smoking status: Never Smoker  . Smokeless tobacco: Never Used  Substance Use Topics  . Alcohol use: Yes    Alcohol/week: 7.0 - 8.0 standard drinks    Types: 1 Cans of beer, 3 Shots of liquor, 3 - 4 Standard drinks or equivalent per week    Comment: 07/03/16 1 pint 2-3X/week  . Drug use: No    ROS   Objective:   Vitals: BP 122/63 (BP Location: Right Arm)   Pulse 97   Temp 99 F (37.2 C) (Oral)   Resp 17   SpO2 98%   Physical Exam Constitutional:      General: He is not in acute distress.    Appearance: Normal appearance. He is well-developed. He is obese. He is not ill-appearing, toxic-appearing or diaphoretic.  HENT:     Head:  Normocephalic and atraumatic.     Right Ear: External ear normal.     Left Ear: External ear normal.     Nose: Nose normal.     Mouth/Throat:     Pharynx: Oropharynx is clear.  Eyes:     General: No scleral icterus.       Right eye: No discharge.        Left eye: No discharge.     Extraocular Movements: Extraocular movements intact.  Pupils: Pupils are equal, round, and reactive to light.  Neck:     Musculoskeletal: Normal range of motion.  Cardiovascular:     Rate and Rhythm: Normal rate.  Pulmonary:     Effort: Pulmonary effort is normal. No respiratory distress.     Breath sounds: No stridor. No wheezing, rhonchi or rales.  Musculoskeletal:     Comments: Wheelchair-bound.  Neurological:     Mental Status: He is alert and oriented to person, place, and time.  Psychiatric:        Mood and Affect: Mood normal.        Behavior: Behavior normal.        Thought Content: Thought content normal.        Judgment: Judgment normal.     Results for orders placed or performed during the hospital encounter of 01/04/19 (from the past 24 hour(s))  POC SARS Coronavirus 2 Ag     Status: None   Collection Time: 01/04/19  6:07 PM  Result Value Ref Range   SARS Coronavirus 2 Ag NEGATIVE NEGATIVE  POC SARS Coronavirus 2 Ag-ED -     Status: None   Collection Time: 01/04/19  6:07 PM  Result Value Ref Range   SARS Coronavirus 2 Ag NEGATIVE NEGATIVE    Assessment and Plan :   1. Fever, unspecified   2. End stage renal disease ([place])    Covid testing is negative, recommended patient resume dialysis treatments.  Documentation provided.    Jaynee Eagles, [place] 01/04/19 1840

## 2019-01-06 DIAGNOSIS — D509 Iron deficiency anemia, unspecified: Secondary | ICD-10-CM | POA: Diagnosis not present

## 2019-01-06 DIAGNOSIS — N2581 Secondary hyperparathyroidism of renal origin: Secondary | ICD-10-CM | POA: Diagnosis not present

## 2019-01-06 DIAGNOSIS — N186 End stage renal disease: Secondary | ICD-10-CM | POA: Diagnosis not present

## 2019-01-06 DIAGNOSIS — D631 Anemia in chronic kidney disease: Secondary | ICD-10-CM | POA: Diagnosis not present

## 2019-01-06 DIAGNOSIS — Z992 Dependence on renal dialysis: Secondary | ICD-10-CM | POA: Diagnosis not present

## 2019-01-10 DIAGNOSIS — D509 Iron deficiency anemia, unspecified: Secondary | ICD-10-CM | POA: Diagnosis not present

## 2019-01-10 DIAGNOSIS — Z992 Dependence on renal dialysis: Secondary | ICD-10-CM | POA: Diagnosis not present

## 2019-01-10 DIAGNOSIS — N2581 Secondary hyperparathyroidism of renal origin: Secondary | ICD-10-CM | POA: Diagnosis not present

## 2019-01-10 DIAGNOSIS — N186 End stage renal disease: Secondary | ICD-10-CM | POA: Diagnosis not present

## 2019-01-10 DIAGNOSIS — D631 Anemia in chronic kidney disease: Secondary | ICD-10-CM | POA: Diagnosis not present

## 2019-02-10 ENCOUNTER — Emergency Department (HOSPITAL_COMMUNITY)
Admission: EM | Admit: 2019-02-10 | Discharge: 2019-02-10 | Disposition: A | Discharge status: Home / Self Care | Payer: Medicare Other | Attending: Emergency Medicine | Admitting: Emergency Medicine

## 2019-02-10 ENCOUNTER — Other Ambulatory Visit: Payer: Self-pay

## 2019-02-10 ENCOUNTER — Emergency Department (HOSPITAL_COMMUNITY): Payer: Medicare Other

## 2019-02-10 ENCOUNTER — Encounter (HOSPITAL_COMMUNITY): Payer: Self-pay

## 2019-02-10 DIAGNOSIS — E119 Type 2 diabetes mellitus without complications: Secondary | ICD-10-CM | POA: Insufficient documentation

## 2019-02-10 DIAGNOSIS — N186 End stage renal disease: Secondary | ICD-10-CM | POA: Insufficient documentation

## 2019-02-10 DIAGNOSIS — I4891 Unspecified atrial fibrillation: Secondary | ICD-10-CM | POA: Diagnosis not present

## 2019-02-10 DIAGNOSIS — M25561 Pain in right knee: Secondary | ICD-10-CM | POA: Insufficient documentation

## 2019-02-10 DIAGNOSIS — I509 Heart failure, unspecified: Secondary | ICD-10-CM | POA: Diagnosis not present

## 2019-02-10 DIAGNOSIS — Z8673 Personal history of transient ischemic attack (TIA), and cerebral infarction without residual deficits: Secondary | ICD-10-CM | POA: Diagnosis not present

## 2019-02-10 DIAGNOSIS — Z992 Dependence on renal dialysis: Secondary | ICD-10-CM | POA: Diagnosis not present

## 2019-02-10 DIAGNOSIS — Z79899 Other long term (current) drug therapy: Secondary | ICD-10-CM | POA: Insufficient documentation

## 2019-02-10 DIAGNOSIS — I132 Hypertensive heart and chronic kidney disease with heart failure and with stage 5 chronic kidney disease, or end stage renal disease: Secondary | ICD-10-CM | POA: Diagnosis not present

## 2019-02-10 MED ORDER — HYDROCODONE-ACETAMINOPHEN 5-325 MG PO TABS: 1.0000 | ORAL_TABLET | Freq: Four times a day (QID) | ORAL | 0 refills | Status: DC | PRN

## 2019-02-10 NOTE — ED Triage Notes (Signed)
Pt states that he fell backwards off the curb Saturday. Pt thinks he may have hyperextended his knee, as he has had increasing right knee pain since.

## 2019-02-10 NOTE — ED Provider Notes (Signed)
[facility] DEPT Provider Note   CSN: [id number] Arrival date & time: 02/10/19  1724     History Chief Complaint  Patient presents with  . Fall  . Knee Pain    right    Daniel Whitney is a 63 y.o. male.  Patient is a 63 year old male who presents with right knee pain.  He states that 4 days ago he fell backwards off a curb and jarred his right knee.  He thinks he may have hyperextended it although he did not feel it go out of joint.  He has had some pain in that joint since the time.  He denies any other injuries from the fall.  He has been having difficulty walking on it.  He has had a prior knee replacement.  He has had some minor swelling.  He has not really been take anything for the pain.        Past Medical History:  Diagnosis Date  . Allergic rhinitis 07/07/2014  . Anemia due to other cause 07/07/2014  . Arthritis    "all over"  . Atrial fibrillation ([place]) 12/30/2012  . CHF (congestive heart failure) ([place]) 12/30/2012  . Chronic lower back pain   . Diabetes mellitus with neuropathy ([place])   . Diabetic retinopathy ([place]) 12/30/2012   no medications  . Dysrhythmia    Afib  . ESRD (end stage renal disease) on dialysis ([place]) started in 2013   MWF; [facility]; [facility]  . GERD (gastroesophageal reflux disease)   . Glaucoma   . History of gout    "right big toe"  . Hyperlipidemia   . Hypertension   . Hypothyroid    01/17/16- no longer on meduication  . Morbid obesity ([place]) 12/30/2012  . Myocardial infarction ([place])    "I've had ~ 3; last one was in ~ 10/2013" (01/30/2014)  . OSA (obstructive sleep apnea) 12/30/2012   "lost weight; no longer needed CPAP; retested said I needed it; didn't followup cause I was feeling fine" (01/30/2014)  . Pulmonary hypertension ([place]) 12/30/2012  . Refusal of blood transfusions as patient is Jehovah's Witness   . Stroke [facility]) ~ 2005; ~ 2005   "they were mild; I didn't even notice I'd had them"; denies  residual on 01/30/2014  . Subdural hematoma (Oct 2018)    Coumadin and heparin placed on hold at dc- pt did not FU w/ NS as recommended after dc so not on warfarin when admitted 04/03/17    Patient Active Problem List   Diagnosis Date Noted  . Shock ([place]) 02/24/2018  . Hyperkalemia, diminished renal excretion 01/01/2018  . Syncope and collapse 04/03/2017  . History of CVA (cerebrovascular accident) 04/03/2017  . CKD (chronic kidney disease) stage V requiring chronic dialysis ([place]) 04/03/2017  . Hypothyroid 04/03/2017  . Refusal of blood transfusions as patient is Jehovah's Witness 04/03/2017  . Prediabetes-HgbA1c 6.2 04/03/2017  . History of subdural hematoma Oct 2018 04/03/2017  . 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 Chronic atrial fibrillation 04/03/2017  . Subdural hematoma ([place]) 11/29/2016  . Peripheral neuropathy 07/26/2016  . At risk for adverse drug reaction 07/03/2016  . Primary osteoarthritis of left knee 06/26/2016  . Postoperative anemia due to acute blood loss 01/27/2016  . S/P knee replacement 01/24/2016  . Low back pain 02/09/2015  . Bilateral knee pain 01/27/2015  . Atypical atrial flutter ([place]) 01/24/2015  . Acute on chronic combined systolic and diastolic heart failure ([place]) 01/05/2015  .  Hyperkalemia 11/08/2014  . Hypocalcemia 11/08/2014  . Recurrent falls 10/13/2014  . Venous stasis ulcer ([place]) 10/13/2014  . Hypoxia   . Leukocytosis   . Respiratory failure ([place])   . ARDS (adult respiratory distress syndrome) ([place])   . Encounter for central line placement   . Cardiopulmonary arrest ([place])   . Compression fracture of L3 lumbar vertebra 08/04/2014  . Anemia of chronic renal failure, unspecified stage 07/07/2014  . Allergic rhinitis 07/07/2014  . Anemia of chronic disease 07/07/2014  . Right rotator cuff tear 04/27/2014  . Acute gouty arthritis 11/11/2013  . Hearing loss in right ear 11/11/2013  . Chronic pain  syndrome 11/11/2013  . Right shoulder pain 11/11/2013  . Facial rash 11/01/2013  . Right otitis externa 10/27/2013  . Pulmonary HTN- PA 58 mmHg 04/16/13 09/17/2013  . Right hip pain 08/04/2013  . DOE (dyspnea on exertion) 06/18/2013  . Allergic rhinitis, cause unspecified 06/18/2013  . Carpal tunnel syndrome 06/06/2013  . Primary localized osteoarthrosis, lower leg 06/04/2013  . Preoperative cardiovascular examination 03/10/2013  . Preventative health care 12/30/2012  . AF (paroxysmal atrial fibrillation) ([place]) 12/30/2012  . CHF (congestive heart failure) ([place]) 12/30/2012  . Diabetic retinopathy ([place]) 12/30/2012  . Morbid obesity ([place]) 12/30/2012  . OSA (obstructive sleep apnea)- on C-pap 12/30/2012  . Bilateral hearing loss 12/30/2012  . Arthritis   . Glaucoma   . Diabetes mellitus with neuropathy ([place])   . Hypertension   . ESRD (end stage renal disease) on dialysis ([place])   . Stroke ([place])   . Hyperlipidemia     Past Surgical History:  Procedure Laterality Date  . AV FISTULA PLACEMENT Left ~ 10/2013   "forearm"  . CARDIAC CATHETERIZATION N/A 01/07/2015   Procedure: Left Heart Cath and Coronary Angiography;  Surgeon: Troy Sine, MD;  Location: [facility];  Service: Cardiovascular;  Laterality: N/A;  . CLOSED REDUCTION HIP DISLOCATION Right 1970's  . JOINT REPLACEMENT    . TOTAL HIP ARTHROPLASTY Right 1980  . TOTAL KNEE ARTHROPLASTY Right 01/24/2016   Procedure: TOTAL KNEE ARTHROPLASTY;  Surgeon: Dorna Leitz, MD;  Location: [facility];  Service: Orthopedics;  Laterality: Right;  . TOTAL KNEE ARTHROPLASTY Left 06/26/2016   Procedure: TOTAL KNEE ARTHROPLASTY;  Surgeon: Dorna Leitz, MD;  Location: [facility];  Service: Orthopedics;  Laterality: Left;       Family History  Problem Relation Age of Onset  . Arthritis Other   . Heart disease Other   . Hyperlipidemia Other   . Hypertension Other   . Kidney disease Other   . Diabetes Other   . Stroke Other   . Diabetes Mother    . Cancer Father        bone cancer  . Heart disease Father   . Diabetes Sister   . Diabetes Brother   . Stroke Brother     Social History   Tobacco Use  . Smoking status: Never Smoker  . Smokeless tobacco: Never Used  Substance Use Topics  . Alcohol use: Yes    Alcohol/week: 7.0 - 8.0 standard drinks    Types: 1 Cans of beer, 3 Shots of liquor, 3 - 4 Standard drinks or equivalent per week    Comment: 07/03/16 1 pint 2-3X/week  . Drug use: No    Home Medications Prior to Admission medications   Medication Sig Start Date End Date Taking? Authorizing Provider  allopurinol (ZYLOPRIM) 100 MG tablet Take 1 tablet (100 mg total) by mouth 2 (two)  times daily. 07/31/16   Medina-Vargas, Monina C, NP  amitriptyline (ELAVIL) 50 MG tablet Take 1 tablet (50 mg total) by mouth at bedtime. 02/28/18   Rai, Ripudeep K, MD  b complex-vitamin c-folic acid (NEPHRO-VITE) 0.8 MG TABS tablet Take 1 tablet by mouth daily.    [provider]  cinacalcet (SENSIPAR) 30 MG tablet Take 30 mg by mouth daily with supper. 12/23/17   [provider]  clindamycin (CLEOCIN T) 1 % external solution Apply 1 application topically 2 (two) times daily. Apply to face.    [provider]  Dextromethorphan-Guaifenesin (MUCINEX DM) 30-600 MG TB12 Take 1 tablet by mouth daily as needed (congestion).     [provider]  docusate sodium (COLACE) 100 MG capsule Take 100 mg by mouth daily as needed for mild constipation.     [provider]  gabapentin (NEURONTIN) 100 MG capsule Take 2 capsules (200 mg total) by mouth at bedtime. 200 mg qam and 300 mg upon return from dialysis on M-W-F for neuropathy Patient taking differently: Take 100-200 mg by mouth See admin instructions. 100mg  in the morning and 200mg  in the evening 07/31/16   Medina-Vargas, Monina C, NP  HYDROcodone-acetaminophen (NORCO/VICODIN) 5-325 MG tablet Take 1-2 tablets by mouth every 6 (six) hours as needed. 02/10/19    Malvin Johns, MD  lactulose (CHRONULAC) 10 GM/15ML solution Take 20 g by mouth 2 (two) times daily as needed for mild constipation.    [provider]  nystatin cream (MYCOSTATIN) Apply 1 application topically 2 (two) times daily as needed (rash).    [provider]  simvastatin (ZOCOR) 20 MG tablet Take 1 tablet (20 mg total) by mouth daily. Patient taking differently: Take 20 mg by mouth at bedtime.  07/31/16   Medina-Vargas, Monina C, NP  sodium chloride (OCEAN) 0.65 % SOLN nasal spray Place 1 spray into both nostrils as needed for congestion. 01/31/14   Ghimire, Henreitta Leber, MD  tretinoin (RETIN-A) 0.05 % cream Apply 1 application topically at bedtime. Apply to face    [provider]    Allergies    Tobacco [tobacco] and Other  Review of Systems   Review of Systems  Constitutional: Negative for fever.  Gastrointestinal: Negative for nausea and vomiting.  Musculoskeletal: Positive for arthralgias and joint swelling. Negative for back pain and neck pain.  Skin: Negative for wound.  Neurological: Negative for weakness, numbness and headaches.    Physical Exam Updated Vital Signs BP 140/83 (BP Location: Right Arm)   Pulse 81   Temp 98 F (36.7 C) (Oral)   Resp 16   SpO2 95%   Physical Exam Constitutional:      Appearance: He is well-developed.  HENT:     Head: Normocephalic and atraumatic.  Neck:     Comments: No pain to neck or back Musculoskeletal:        General: Tenderness present.     Cervical back: Normal range of motion and neck supple.     Comments: Patient has tenderness on varus stressing.  He has some tenderness over the lateral joint lines of the right knee.  There is minimal swelling.  Mild effusion noted.  No warmth or erythema.  No joint instability noted.  He is able do a straight leg raise.  Pedal pulses are intact by Doppler.  He had some swelling of his lower extremities bilaterally and it was difficult to palpate the pulses.  He  has normal sensation and motor function distally.  There  is no pain to the ankle or hip.  Neurological:     Mental Status: He is alert and oriented to person, place, and time.     ED Results / Procedures / Treatments   Labs (all labs ordered are listed, but only abnormal results are displayed) Labs Reviewed - No data to display  EKG None  Radiology DG Knee Complete 4 Views Right  Result Date: 02/10/2019 CLINICAL DATA:  Pain status post fall EXAM: RIGHT KNEE - COMPLETE 4+ VIEW COMPARISON:  August 22, 2017 FINDINGS: Patient is status post total knee arthroplasty on the right. Hardware appears grossly intact. Advanced vascular calcifications are noted. Again noted is a high riding patella somewhat similar to prior study in 2019 given differences in technique. There are new calcifications projecting anterior to the joint space which may reside within the patellar tendon. There is a small joint effusion. Osteopenia is noted. IMPRESSION: 1. No acute displaced fracture or dislocation. 2. Again noted is a high riding patella, similar to prior study in 2019. There are calcifications in the region of the patellar tendon. Overall, these findings are concerning for a chronic patellar tendon injury or rupture. 3. Small joint effusion. 4. Status post arthroplasty without evidence for hardware fracture. 5. Advanced vascular calcifications are noted. Electronically Signed   By: Constance Holster M.D.   On: 02/10/2019 19:09    Procedures Procedures (including critical care time)  Medications Ordered in ED Medications - No data to display  ED Course  I have reviewed the triage vital signs and the nursing notes.  Pertinent labs & imaging results that were available during my care of the patient were reviewed by me and considered in my medical decision making (see chart for details).    MDM Rules/Calculators/A&P                      X-rays show no acute joint abnormality other than a mild effusion.   He has a known prior high riding patella.  He states it has been like this since the surgery.  He was placed in a knee sleeve.  He was advised in ice and elevation.  He was given a prescription for hydrocodone for pain.  He was encouraged to follow-up with his orthopedist. Final Clinical Impression(s) / ED Diagnoses Final diagnoses:  Acute pain of right knee    Rx / DC Orders ED Discharge Orders         Ordered    HYDROcodone-acetaminophen (NORCO/VICODIN) 5-325 MG tablet  Every 6 hours PRN     02/10/19 2010           Malvin Johns, MD 02/10/19 2015

## 2019-02-15 ENCOUNTER — Other Ambulatory Visit: Payer: Self-pay

## 2019-02-15 ENCOUNTER — Observation Stay (HOSPITAL_COMMUNITY)
Admission: EM | Admit: 2019-02-15 | Discharge: 2019-02-16 | Disposition: A | Discharge status: Home / Self Care | Payer: Medicare HMO | Attending: Internal Medicine | Admitting: Internal Medicine

## 2019-02-15 ENCOUNTER — Emergency Department (HOSPITAL_COMMUNITY): Payer: Medicare HMO

## 2019-02-15 ENCOUNTER — Encounter (HOSPITAL_COMMUNITY): Payer: Self-pay | Admitting: Emergency Medicine

## 2019-02-15 DIAGNOSIS — E872 Acidosis, unspecified: Secondary | ICD-10-CM

## 2019-02-15 DIAGNOSIS — R0602 Shortness of breath: Secondary | ICD-10-CM

## 2019-02-15 DIAGNOSIS — G894 Chronic pain syndrome: Secondary | ICD-10-CM | POA: Diagnosis present

## 2019-02-15 DIAGNOSIS — Z79899 Other long term (current) drug therapy: Secondary | ICD-10-CM | POA: Insufficient documentation

## 2019-02-15 DIAGNOSIS — I132 Hypertensive heart and chronic kidney disease with heart failure and with stage 5 chronic kidney disease, or end stage renal disease: Secondary | ICD-10-CM | POA: Diagnosis not present

## 2019-02-15 DIAGNOSIS — E785 Hyperlipidemia, unspecified: Secondary | ICD-10-CM | POA: Diagnosis not present

## 2019-02-15 DIAGNOSIS — Z8349 Family history of other endocrine, nutritional and metabolic diseases: Secondary | ICD-10-CM | POA: Insufficient documentation

## 2019-02-15 DIAGNOSIS — Z8249 Family history of ischemic heart disease and other diseases of the circulatory system: Secondary | ICD-10-CM | POA: Insufficient documentation

## 2019-02-15 DIAGNOSIS — I4891 Unspecified atrial fibrillation: Secondary | ICD-10-CM | POA: Diagnosis not present

## 2019-02-15 DIAGNOSIS — E1122 Type 2 diabetes mellitus with diabetic chronic kidney disease: Secondary | ICD-10-CM | POA: Insufficient documentation

## 2019-02-15 DIAGNOSIS — I4510 Unspecified right bundle-branch block: Secondary | ICD-10-CM | POA: Diagnosis not present

## 2019-02-15 DIAGNOSIS — I509 Heart failure, unspecified: Secondary | ICD-10-CM | POA: Insufficient documentation

## 2019-02-15 DIAGNOSIS — Z823 Family history of stroke: Secondary | ICD-10-CM | POA: Insufficient documentation

## 2019-02-15 DIAGNOSIS — Z6835 Body mass index (BMI) 35.0-35.9, adult: Secondary | ICD-10-CM | POA: Diagnosis not present

## 2019-02-15 DIAGNOSIS — R000 Tachycardia, unspecified: Secondary | ICD-10-CM | POA: Diagnosis not present

## 2019-02-15 DIAGNOSIS — D631 Anemia in chronic kidney disease: Secondary | ICD-10-CM | POA: Diagnosis not present

## 2019-02-15 DIAGNOSIS — I272 Pulmonary hypertension, unspecified: Secondary | ICD-10-CM | POA: Insufficient documentation

## 2019-02-15 DIAGNOSIS — E871 Hypo-osmolality and hyponatremia: Secondary | ICD-10-CM

## 2019-02-15 DIAGNOSIS — N186 End stage renal disease: Secondary | ICD-10-CM

## 2019-02-15 DIAGNOSIS — R9431 Abnormal electrocardiogram [ECG] [EKG]: Secondary | ICD-10-CM | POA: Diagnosis present

## 2019-02-15 DIAGNOSIS — Z531 Procedure and treatment not carried out because of patient's decision for reasons of belief and group pressure: Secondary | ICD-10-CM | POA: Insufficient documentation

## 2019-02-15 DIAGNOSIS — M545 Low back pain: Secondary | ICD-10-CM | POA: Diagnosis not present

## 2019-02-15 DIAGNOSIS — K219 Gastro-esophageal reflux disease without esophagitis: Secondary | ICD-10-CM | POA: Insufficient documentation

## 2019-02-15 DIAGNOSIS — H42 Glaucoma in diseases classified elsewhere: Secondary | ICD-10-CM | POA: Diagnosis not present

## 2019-02-15 DIAGNOSIS — Z9115 Patient's noncompliance with renal dialysis: Secondary | ICD-10-CM | POA: Diagnosis not present

## 2019-02-15 DIAGNOSIS — Z20822 Contact with and (suspected) exposure to covid-19: Secondary | ICD-10-CM | POA: Insufficient documentation

## 2019-02-15 DIAGNOSIS — I252 Old myocardial infarction: Secondary | ICD-10-CM | POA: Insufficient documentation

## 2019-02-15 DIAGNOSIS — N2581 Secondary hyperparathyroidism of renal origin: Secondary | ICD-10-CM | POA: Diagnosis not present

## 2019-02-15 DIAGNOSIS — I484 Atypical atrial flutter: Secondary | ICD-10-CM | POA: Diagnosis present

## 2019-02-15 DIAGNOSIS — F329 Major depressive disorder, single episode, unspecified: Secondary | ICD-10-CM | POA: Insufficient documentation

## 2019-02-15 DIAGNOSIS — E039 Hypothyroidism, unspecified: Secondary | ICD-10-CM | POA: Diagnosis not present

## 2019-02-15 DIAGNOSIS — G4733 Obstructive sleep apnea (adult) (pediatric): Secondary | ICD-10-CM | POA: Insufficient documentation

## 2019-02-15 DIAGNOSIS — Z96641 Presence of right artificial hip joint: Secondary | ICD-10-CM | POA: Insufficient documentation

## 2019-02-15 DIAGNOSIS — R0902 Hypoxemia: Secondary | ICD-10-CM | POA: Diagnosis not present

## 2019-02-15 DIAGNOSIS — E1139 Type 2 diabetes mellitus with other diabetic ophthalmic complication: Secondary | ICD-10-CM | POA: Insufficient documentation

## 2019-02-15 DIAGNOSIS — R531 Weakness: Secondary | ICD-10-CM

## 2019-02-15 DIAGNOSIS — I10 Essential (primary) hypertension: Secondary | ICD-10-CM | POA: Diagnosis present

## 2019-02-15 DIAGNOSIS — Z8673 Personal history of transient ischemic attack (TIA), and cerebral infarction without residual deficits: Secondary | ICD-10-CM | POA: Insufficient documentation

## 2019-02-15 DIAGNOSIS — Z992 Dependence on renal dialysis: Secondary | ICD-10-CM

## 2019-02-15 DIAGNOSIS — Z833 Family history of diabetes mellitus: Secondary | ICD-10-CM | POA: Insufficient documentation

## 2019-02-15 DIAGNOSIS — E875 Hyperkalemia: Secondary | ICD-10-CM | POA: Diagnosis not present

## 2019-02-15 DIAGNOSIS — Z96653 Presence of artificial knee joint, bilateral: Secondary | ICD-10-CM | POA: Insufficient documentation

## 2019-02-15 DIAGNOSIS — E1140 Type 2 diabetes mellitus with diabetic neuropathy, unspecified: Secondary | ICD-10-CM | POA: Diagnosis present

## 2019-02-15 DIAGNOSIS — E11319 Type 2 diabetes mellitus with unspecified diabetic retinopathy without macular edema: Secondary | ICD-10-CM | POA: Insufficient documentation

## 2019-02-15 DIAGNOSIS — M109 Gout, unspecified: Secondary | ICD-10-CM | POA: Insufficient documentation

## 2019-02-15 LAB — POCT I-STAT EG7
Acid-base deficit: 7 mmol/L — ABNORMAL HIGH (ref 0.0–2.0)
Bicarbonate: 21.6 mmol/L (ref 20.0–28.0)
Calcium, Ion: 0.89 mmol/L — CL (ref 1.15–1.40)
HCT: 36 % — ABNORMAL LOW (ref 39.0–52.0)
Hemoglobin: 12.2 g/dL — ABNORMAL LOW (ref 13.0–17.0)
O2 Saturation: 53 %
Potassium: 7.7 mmol/L (ref 3.5–5.1)
Sodium: 121 mmol/L — ABNORMAL LOW (ref 135–145)
TCO2: 23 mmol/L (ref 22–32)
pCO2, Ven: 56 mmHg (ref 44.0–60.0)
pH, Ven: 7.194 — CL (ref 7.250–7.430)
pO2, Ven: 35 mmHg (ref 32.0–45.0)

## 2019-02-15 LAB — I-STAT CHEM 8, ED
BUN: 92 mg/dL — ABNORMAL HIGH (ref 8–23)
Calcium, Ion: 0.73 mmol/L — CL (ref 1.15–1.40)
Chloride: 95 mmol/L — ABNORMAL LOW (ref 98–111)
Creatinine, Ser: 15.9 mg/dL — ABNORMAL HIGH (ref 0.61–1.24)
Glucose, Bld: 116 mg/dL — ABNORMAL HIGH (ref 70–99)
HCT: 38 % — ABNORMAL LOW (ref 39.0–52.0)
Hemoglobin: 12.9 g/dL — ABNORMAL LOW (ref 13.0–17.0)
Potassium: 7.6 mmol/L (ref 3.5–5.1)
Sodium: 119 mmol/L — CL (ref 135–145)
TCO2: 17 mmol/L — ABNORMAL LOW (ref 22–32)

## 2019-02-15 LAB — CBC WITH DIFFERENTIAL/PLATELET
Abs Immature Granulocytes: 0.03 10*3/uL (ref 0.00–0.07)
Basophils Absolute: 0 10*3/uL (ref 0.0–0.1)
Basophils Relative: 0 %
Eosinophils Absolute: 0 10*3/uL (ref 0.0–0.5)
Eosinophils Relative: 0 %
HCT: 35.7 % — ABNORMAL LOW (ref 39.0–52.0)
Hemoglobin: 11.5 g/dL — ABNORMAL LOW (ref 13.0–17.0)
Immature Granulocytes: 0 %
Lymphocytes Relative: 10 %
Lymphs Abs: 0.7 10*3/uL (ref 0.7–4.0)
MCH: 32.6 pg (ref 26.0–34.0)
MCHC: 32.2 g/dL (ref 30.0–36.0)
MCV: 101.1 fL — ABNORMAL HIGH (ref 80.0–100.0)
Monocytes Absolute: 0.6 10*3/uL (ref 0.1–1.0)
Monocytes Relative: 9 %
Neutro Abs: 5.5 10*3/uL (ref 1.7–7.7)
Neutrophils Relative %: 81 %
Platelets: 195 10*3/uL (ref 150–400)
RBC: 3.53 MIL/uL — ABNORMAL LOW (ref 4.22–5.81)
RDW: 14.2 % (ref 11.5–15.5)
WBC: 6.9 10*3/uL (ref 4.0–10.5)
nRBC: 0 % (ref 0.0–0.2)

## 2019-02-15 LAB — POTASSIUM
Potassium: 4.5 mmol/L (ref 3.5–5.1)
Potassium: 4.9 mmol/L (ref 3.5–5.1)
Potassium: 4.8 mmol/L (ref 3.5–5.1)

## 2019-02-15 LAB — BASIC METABOLIC PANEL
Anion gap: 19 — ABNORMAL HIGH (ref 5–15)
BUN: 92 mg/dL — ABNORMAL HIGH (ref 8–23)
CO2: 16 mmol/L — ABNORMAL LOW (ref 22–32)
Calcium: 7.5 mg/dL — ABNORMAL LOW (ref 8.9–10.3)
Chloride: 88 mmol/L — ABNORMAL LOW (ref 98–111)
Creatinine, Ser: 14.03 mg/dL — ABNORMAL HIGH (ref 0.61–1.24)
GFR calc Af Amer: 4 mL/min — ABNORMAL LOW (ref >60)
GFR calc non Af Amer: 3 mL/min — ABNORMAL LOW (ref >60)
Glucose, Bld: 121 mg/dL — ABNORMAL HIGH (ref 70–99)
Potassium: >7.5 mmol/L (ref 3.5–5.1)
Sodium: 123 mmol/L — ABNORMAL LOW (ref 135–145)

## 2019-02-15 LAB — PROTIME-INR
INR: 1.1 (ref 0.8–1.2)
Prothrombin Time: 14.3 seconds (ref 11.4–15.2)

## 2019-02-15 LAB — RESPIRATORY PANEL BY RT PCR (FLU A&B, COVID)
Influenza A by PCR: NEGATIVE
Influenza B by PCR: NEGATIVE
SARS Coronavirus 2 by RT PCR: NEGATIVE

## 2019-02-15 LAB — TROPONIN I (HIGH SENSITIVITY)
Troponin I (High Sensitivity): 30 ng/L — ABNORMAL HIGH (ref <18)
Troponin I (High Sensitivity): 27 ng/L — ABNORMAL HIGH (ref <18)

## 2019-02-15 LAB — MAGNESIUM: Magnesium: 2.6 mg/dL — ABNORMAL HIGH (ref 1.7–2.4)

## 2019-02-15 LAB — TSH: TSH: 2.578 u[IU]/mL (ref 0.350–4.500)

## 2019-02-15 LAB — CBG MONITORING, ED: Glucose-Capillary: 122 mg/dL — ABNORMAL HIGH (ref 70–99)

## 2019-02-15 LAB — HIV ANTIBODY (ROUTINE TESTING W REFLEX): HIV Screen 4th Generation wRfx: NONREACTIVE

## 2019-02-15 LAB — T4, FREE: Free T4: 0.83 ng/dL (ref 0.61–1.12)

## 2019-02-15 MED ORDER — SALINE SPRAY 0.65 % NA SOLN
1.0000 | NASAL | Status: DC | PRN
  Filled 2019-02-15: qty 44

## 2019-02-15 MED ORDER — DOCUSATE SODIUM 100 MG PO CAPS
100.0000 mg | ORAL_CAPSULE | Freq: Two times a day (BID) | ORAL | Status: DC
  Administered 2019-02-15 – 2019-02-16 (×2): 100 mg via ORAL
  Filled 2019-02-15 (×2): qty 1

## 2019-02-15 MED ORDER — ZOLPIDEM TARTRATE 5 MG PO TABS: 5.0000 mg | ORAL_TABLET | Freq: Every evening | ORAL | Status: DC | PRN

## 2019-02-15 MED ORDER — INSULIN ASPART 100 UNIT/ML IV SOLN
5.0000 [IU] | Freq: Once | INTRAVENOUS | Status: AC
  Administered 2019-02-15: 5 [IU] via INTRAVENOUS

## 2019-02-15 MED ORDER — ALBUTEROL SULFATE (2.5 MG/3ML) 0.083% IN NEBU
10.0000 mg | INHALATION_SOLUTION | Freq: Once | RESPIRATORY_TRACT | Status: AC
  Administered 2019-02-15: 10 mg via RESPIRATORY_TRACT
  Filled 2019-02-15: qty 12

## 2019-02-15 MED ORDER — DEXTROSE 50 % IV SOLN
1.0000 | Freq: Once | INTRAVENOUS | Status: AC
  Administered 2019-02-15: 50 mL via INTRAVENOUS
  Filled 2019-02-15: qty 50

## 2019-02-15 MED ORDER — HYDROXYZINE HCL 25 MG PO TABS: 25.0000 mg | ORAL_TABLET | Freq: Three times a day (TID) | ORAL | Status: DC | PRN

## 2019-02-15 MED ORDER — CHLORHEXIDINE GLUCONATE CLOTH 2 % EX PADS
6.0000 | MEDICATED_PAD | Freq: Every day | CUTANEOUS | Status: DC
  Administered 2019-02-16: 6 via TOPICAL

## 2019-02-15 MED ORDER — CLINDAMYCIN PHOSPHATE 1 % EX SOLN: 1.0000 "application " | Freq: Two times a day (BID) | CUTANEOUS | Status: DC

## 2019-02-15 MED ORDER — TRETINOIN 0.05 % EX CREA: 1.0000 "application " | TOPICAL_CREAM | Freq: Every day | CUTANEOUS | Status: DC

## 2019-02-15 MED ORDER — DM-GUAIFENESIN ER 30-600 MG PO TB12: 1.0000 | ORAL_TABLET | Freq: Every day | ORAL | Status: DC | PRN

## 2019-02-15 MED ORDER — AMITRIPTYLINE HCL 25 MG PO TABS: 50.0000 mg | ORAL_TABLET | Freq: Every day | ORAL | Status: DC

## 2019-02-15 MED ORDER — HEPARIN SODIUM (PORCINE) 1000 UNIT/ML IJ SOLN
INTRAMUSCULAR | Status: AC
  Filled 2019-02-15: qty 5

## 2019-02-15 MED ORDER — SODIUM CHLORIDE 0.9 % IV SOLN
1.0000 g | Freq: Once | INTRAVENOUS | Status: AC
  Administered 2019-02-15: 1 g via INTRAVENOUS
  Filled 2019-02-15: qty 10

## 2019-02-15 MED ORDER — HEPARIN SODIUM (PORCINE) 1000 UNIT/ML IJ SOLN
INTRAMUSCULAR | Status: AC
  Filled 2019-02-15: qty 3

## 2019-02-15 MED ORDER — CALCIUM CARBONATE ANTACID 1250 MG/5ML PO SUSP
500.0000 mg | Freq: Four times a day (QID) | ORAL | Status: DC | PRN
  Filled 2019-02-15: qty 5

## 2019-02-15 MED ORDER — CINACALCET HCL 30 MG PO TABS
30.0000 mg | ORAL_TABLET | Freq: Every day | ORAL | Status: DC
  Administered 2019-02-15 – 2019-02-16 (×2): 30 mg via ORAL
  Filled 2019-02-15 (×2): qty 1

## 2019-02-15 MED ORDER — SIMVASTATIN 20 MG PO TABS
20.0000 mg | ORAL_TABLET | Freq: Every day | ORAL | Status: DC
  Administered 2019-02-15: 20 mg via ORAL
  Filled 2019-02-15: qty 1

## 2019-02-15 MED ORDER — CAMPHOR-MENTHOL 0.5-0.5 % EX LOTN: 1.0000 "application " | TOPICAL_LOTION | Freq: Three times a day (TID) | CUTANEOUS | Status: DC | PRN

## 2019-02-15 MED ORDER — NEPRO/CARBSTEADY PO LIQD: 237.0000 mL | Freq: Three times a day (TID) | ORAL | Status: DC | PRN

## 2019-02-15 MED ORDER — ACETAMINOPHEN 650 MG RE SUPP: 650.0000 mg | Freq: Four times a day (QID) | RECTAL | Status: DC | PRN

## 2019-02-15 MED ORDER — ONDANSETRON HCL 4 MG PO TABS: 4.0000 mg | ORAL_TABLET | Freq: Four times a day (QID) | ORAL | Status: DC | PRN

## 2019-02-15 MED ORDER — ACETAMINOPHEN 325 MG PO TABS
650.0000 mg | ORAL_TABLET | Freq: Four times a day (QID) | ORAL | Status: DC | PRN
  Administered 2019-02-16: 650 mg via ORAL
  Filled 2019-02-15: qty 2

## 2019-02-15 MED ORDER — LACTULOSE 10 GM/15ML PO SOLN: 20.0000 g | Freq: Two times a day (BID) | ORAL | Status: DC | PRN

## 2019-02-15 MED ORDER — SODIUM BICARBONATE 8.4 % IV SOLN
50.0000 meq | Freq: Once | INTRAVENOUS | Status: AC
  Administered 2019-02-15: 50 meq via INTRAVENOUS
  Filled 2019-02-15: qty 50

## 2019-02-15 MED ORDER — DOCUSATE SODIUM 283 MG RE ENEM
1.0000 | ENEMA | RECTAL | Status: DC | PRN
  Filled 2019-02-15: qty 1

## 2019-02-15 MED ORDER — HYDROCODONE-ACETAMINOPHEN 5-325 MG PO TABS: 1.0000 | ORAL_TABLET | Freq: Four times a day (QID) | ORAL | Status: DC | PRN

## 2019-02-15 MED ORDER — HEPARIN SODIUM (PORCINE) 5000 UNIT/ML IJ SOLN
5000.0000 [IU] | Freq: Three times a day (TID) | INTRAMUSCULAR | Status: DC
  Administered 2019-02-15 – 2019-02-16 (×4): 5000 [IU] via SUBCUTANEOUS
  Filled 2019-02-15 (×4): qty 1

## 2019-02-15 MED ORDER — GABAPENTIN 100 MG PO CAPS
100.0000 mg | ORAL_CAPSULE | Freq: Two times a day (BID) | ORAL | Status: DC
  Administered 2019-02-15 – 2019-02-16 (×3): 100 mg via ORAL
  Filled 2019-02-15 (×3): qty 1

## 2019-02-15 MED ORDER — SORBITOL 70 % SOLN: 30.0000 mL | Status: DC | PRN

## 2019-02-15 MED ORDER — RENA-VITE PO TABS
1.0000 | ORAL_TABLET | Freq: Every day | ORAL | Status: DC
  Administered 2019-02-15: 1 via ORAL
  Filled 2019-02-15: qty 1

## 2019-02-15 MED ORDER — SODIUM BICARBONATE 8.4 % IV SOLN
Freq: Once | INTRAVENOUS | Status: AC
  Filled 2019-02-15 (×2): qty 100

## 2019-02-15 MED ORDER — ONDANSETRON HCL 4 MG/2ML IJ SOLN: 4.0000 mg | Freq: Four times a day (QID) | INTRAMUSCULAR | Status: DC | PRN

## 2019-02-15 MED ORDER — ALLOPURINOL 100 MG PO TABS
100.0000 mg | ORAL_TABLET | Freq: Two times a day (BID) | ORAL | Status: DC
  Administered 2019-02-15 – 2019-02-16 (×3): 100 mg via ORAL
  Filled 2019-02-15 (×3): qty 1

## 2019-02-15 MED ORDER — SODIUM CHLORIDE 0.9% FLUSH
3.0000 mL | Freq: Two times a day (BID) | INTRAVENOUS | Status: DC
  Administered 2019-02-15 – 2019-02-16 (×2): 3 mL via INTRAVENOUS

## 2019-02-15 NOTE — Consult Note (Signed)
Renal Service Consult Note [facility]  Daniel Whitney 02/15/2019 [name] Requesting Physician:  Dr [name]  Reason for Consult:  Hyperkalemia, esrd HPI: The patient is a 64 y.o. year-old with hx of CVA, SDH, pulm HTN, OSA, MO, HTN, ESRD on HD, DM2, atrial fib who presented w/ SOB x 2 days, missed last HD yesterday.  In ED K+ 7.7 and EKG showed severe widening of QRS but w/o bradycardia or hypotension. Rec'd acute Rx for ^K+ with insulin/ glu/ Ca/ albuterol/ bicarb and is feeling better now.  Asked to see for dialysis.   Pt missed HD yest, "got my days mixed up". No CP, cough or fever, no abd pain   ROS  denies CP  no joint pain   no HA  no blurry vision  no rash  no diarrhea  no nausea/ vomiting    Past Medical History  Past Medical History:  Diagnosis Date  . Allergic rhinitis 07/07/2014  . Anemia due to other cause 07/07/2014  . Arthritis    "all over"  . Atrial fibrillation ([place]) 12/30/2012  . CHF (congestive heart failure) ([place]) 12/30/2012  . Chronic lower back pain   . Diabetes mellitus with neuropathy ([place])   . Diabetic retinopathy ([place]) 12/30/2012   no medications  . Dysrhythmia    Afib  . ESRD (end stage renal disease) on dialysis ([place]) started in 2013   MWF; [facility]; [facility]  . GERD (gastroesophageal reflux disease)   . Glaucoma   . History of gout    "right big toe"  . Hyperlipidemia   . Hypertension   . Hypothyroid    01/17/16- no longer on meduication  . Morbid obesity ([place]) 12/30/2012  . Myocardial infarction ([place])    "I've had ~ 3; last one was in ~ 10/2013" (01/30/2014)  . OSA (obstructive sleep apnea) 12/30/2012   "lost weight; no longer needed CPAP; retested said I needed it; didn't followup cause I was feeling fine" (01/30/2014)  . Pulmonary hypertension ([place]) 12/30/2012  . Refusal of blood transfusions as patient is Jehovah's Witness   . Stroke [facility]) ~ 2005; ~ 2005   "they were mild; I didn't even notice I'd had  them"; denies residual on 01/30/2014  . Subdural hematoma (Oct 2018)    Coumadin and heparin placed on hold at dc- pt did not FU w/ NS as recommended after dc so not on warfarin when admitted 04/03/17   Past Surgical History  Past Surgical History:  Procedure Laterality Date  . AV FISTULA PLACEMENT Left ~ 10/2013   "forearm"  . CARDIAC CATHETERIZATION N/A 01/07/2015   Procedure: Left Heart Cath and Coronary Angiography;  Surgeon: Troy Sine, MD;  Location: [facility];  Service: Cardiovascular;  Laterality: N/A;  . CLOSED REDUCTION HIP DISLOCATION Right 1970's  . JOINT REPLACEMENT    . TOTAL HIP ARTHROPLASTY Right 1980  . TOTAL KNEE ARTHROPLASTY Right 01/24/2016   Procedure: TOTAL KNEE ARTHROPLASTY;  Surgeon: Dorna Leitz, MD;  Location: [facility];  Service: Orthopedics;  Laterality: Right;  . TOTAL KNEE ARTHROPLASTY Left 06/26/2016   Procedure: TOTAL KNEE ARTHROPLASTY;  Surgeon: Dorna Leitz, MD;  Location: [facility];  Service: Orthopedics;  Laterality: Left;   Family History  Family History  Problem Relation Age of Onset  . Arthritis Other   . Heart disease Other   . Hyperlipidemia Other   . Hypertension Other   . Kidney disease Other   . Diabetes Other   . Stroke Other   .  Diabetes Mother   . Cancer Father        bone cancer  . Heart disease Father   . Diabetes Sister   . Diabetes Brother   . Stroke Brother    Social History  reports that he has never smoked. He has never used smokeless tobacco. He reports current alcohol use of about 7.0 - 8.0 standard drinks of alcohol per week. He reports that he does not use drugs. Allergies  Allergies  Allergen Reactions  . Tobacco [Tobacco] Shortness Of Breath  . Other     Perfume & cologne-eyes itchy   Home medications Prior to Admission medications   Medication Sig Start Date End Date Taking? Authorizing Provider  amitriptyline (ELAVIL) 50 MG tablet Take 1 tablet (50 mg total) by mouth at bedtime. 02/28/18  Yes Rai, Ripudeep  K, MD  b complex-vitamin c-folic acid (NEPHRO-VITE) 0.8 MG TABS tablet Take 1 tablet by mouth daily.   Yes [provider]  cinacalcet (SENSIPAR) 30 MG tablet Take 30 mg by mouth daily with supper. 12/23/17  Yes [provider]  clindamycin (CLEOCIN T) 1 % external solution Apply 1 application topically 2 (two) times daily. Apply to face.   Yes [provider]  Dextromethorphan-Guaifenesin (MUCINEX DM) 30-600 MG TB12 Take 1 tablet by mouth daily as needed (congestion).    Yes [provider]  docusate sodium (COLACE) 100 MG capsule Take 100 mg by mouth daily as needed for mild constipation.    Yes [provider]  gabapentin (NEURONTIN) 100 MG capsule Take 2 capsules (200 mg total) by mouth at bedtime. 200 mg qam and 300 mg upon return from dialysis on M-W-F for neuropathy Patient taking differently: Take 100 mg by mouth 2 (two) times daily.  07/31/16  Yes Medina-Vargas, Monina C, NP  HYDROcodone-acetaminophen (NORCO/VICODIN) 5-325 MG tablet Take 1-2 tablets by mouth every 6 (six) hours as needed. 02/10/19  Yes Malvin Johns, MD  lactulose (CHRONULAC) 10 GM/15ML solution Take 20 g by mouth 2 (two) times daily as needed for mild constipation.   Yes [provider]  nystatin cream (MYCOSTATIN) Apply 1 application topically 2 (two) times daily as needed (rash).   Yes [provider]  simvastatin (ZOCOR) 20 MG tablet Take 1 tablet (20 mg total) by mouth daily. Patient taking differently: Take 20 mg by mouth at bedtime.  07/31/16  Yes Medina-Vargas, Monina C, NP  sodium chloride (OCEAN) 0.65 % SOLN nasal spray Place 1 spray into both nostrils as needed for congestion. 01/31/14  Yes Ghimire, Henreitta Leber, MD  tretinoin (RETIN-A) 0.05 % cream Apply 1 application topically at bedtime. Apply to face   Yes [provider]  allopurinol (ZYLOPRIM) 100 MG tablet Take 1 tablet (100 mg total) by mouth 2 (two) times daily. 07/31/16   Medina-Vargas,  Monina C, NP   Liver Function Tests No results for input(s): AST, ALT, ALKPHOS, BILITOT, PROT, ALBUMIN in the last 168 hours. No results for input(s): LIPASE, AMYLASE in the last 168 hours. CBC Recent Labs  Lab 02/15/19 0525 02/15/19 0611 02/15/19 0613  WBC  --   --  6.9  NEUTROABS  --   --  5.5  HGB 12.9* 12.2* 11.5*  HCT 38.0* 36.0* 35.7*  MCV  --   --  101.1*  PLT  --   --  0000000   Basic Metabolic Panel Recent Labs  Lab 02/15/19 0525 02/15/19 0541 02/15/19 0611  NA 119* 123* 121*  K 7.6* >7.5* 7.7*  CL 95* 88*  --   CO2  --  16*  --   GLUCOSE 116* 121*  --   BUN 92* 92*  --   CREATININE 15.90* 14.03*  --   CALCIUM  --  7.5*  --    Iron/TIBC/Ferritin/ %Sat No results found for: IRON, TIBC, FERRITIN, IRONPCTSAT  Vitals:   02/15/19 0530 02/15/19 0600 02/15/19 0720 02/15/19 0800  BP:  129/65 (!) 133/95 (!) 141/98  Pulse:   87   Resp: 19 20 19 18   Temp:      TempSrc:      SpO2:   100%   Weight:      Height:        Exam Gen asleep, snoring, facial edema No rash, cyanosis or gangrene Sclera anicteric, throat clear  No jvd or bruits Chest clear post and lat bilat RRR no MRG Abd soft ntnd no mass or ascites +bs obese GU normal male MS no joint effusions or deformity Ext 1-2 +pretib edema, no wounds or ulcers Neuro is alert, Ox 3 , nf L AVF+bruit   Outpt HD: [facility] MWF  4h 77min 450/800  111.5kg  AVF  Hep 3000  - meds pending  - missed 1/2 HD  - no BP lowering meds    EKG severe QRS widening    CXR - no edema, poss vasc congestion   Assessment/ Plan: 1. Severe hyperkalemia - w/ QRS widening, but no bradycardia. Plan HD asap upstairs, COVID pending.  2. ESRD - on HD MWF 3. H/o CVA 4. Anemia ckd 5. MBD ckd      Kelly Splinter  MD 02/15/2019, 8:37 AM

## 2019-02-15 NOTE — ED Triage Notes (Signed)
Pt. Arrived via [facility] with complaint of SOB for the past two days. Pt. Missed dialysis on friday and then again on Saturday. Pt. Also complains of right thigh pain that he had gone to [facility] approximately 2 days ago. Pt. Denies any other pain than leg. EMS stated vital were 1246/92 with afib and right bundle branch.

## 2019-02-15 NOTE — H&P (Signed)
History and Physical    Daniel Whitney [id number] DOB: 1955-07-27 DOA: 02/15/2019  PCP: [name], MD Consultants:  [facility] - nephrology Patient coming from:  Home - lives alone; NOK: [place] Campbell [id number]  Chief Complaint: Weakness, SOB  HPI: Daniel Whitney is a 64 y.o. male with medical history significant of SDH (2018); CVA; Jehovah's Witness; pulm HTN; OSA not on CPAP; obesity (BMI 35); hypothyroidism; HTN; HLD; SRD on MWF HD; afib not on AC; DM; and chronic CHF presenting with weakness, SOB.  He reports that "my diet was wrong."  He was taking his medicine wrong.  He missed his last sessions of HD.  He is too somnolent to answer additional questions at this time.  He was seen at [facility] on 12/29 for a fall with R knee pain. Evaluation was unremarkable and he was plated in a knee sleeve and given hydrocodone for pain.  ED Course:  Carryover, per Dr. [name]:  This is a 51 yom with hx of ESRD, a fib no longer anticoagulated, presenting with SOB after missing 2 dialysis sessions, found to have critical hyperkalemia wide-complex tachycardia. He was given temporizing medications and QRS has narrowed and rate normalized on the monitor, patient reports feeling much better. Dr. [name] of nephrology is arranging dialysis. Repeat K+ and EKG ordered.   Review of Systems: Unable to effectively perform   Past Medical History:  Diagnosis Date  . Allergic rhinitis 07/07/2014  . Anemia due to other cause 07/07/2014  . Arthritis    "all over"  . Atrial fibrillation ([place]) 12/30/2012  . CHF (congestive heart failure) ([place]) 12/30/2012  . Chronic lower back pain   . Diabetes mellitus with neuropathy ([place])   . Diabetic retinopathy ([place]) 12/30/2012   no medications  . Dysrhythmia    Afib  . ESRD (end stage renal disease) on dialysis ([place]) started in 2013   MWF; [facility]; [employer]  . GERD (gastroesophageal reflux disease)   . Glaucoma   . History of gout    "right big toe"   . Hyperlipidemia   . Hypertension   . Hypothyroid    01/17/16- no longer on meduication  . Morbid obesity ([place]) 12/30/2012  . Myocardial infarction ([place])    "I've had ~ 3; last one was in ~ 10/2013" (01/30/2014)  . OSA (obstructive sleep apnea) 12/30/2012   "lost weight; no longer needed CPAP; retested said I needed it; didn't followup cause I was feeling fine" (01/30/2014)  . Pulmonary hypertension ([place]) 12/30/2012  . Refusal of blood transfusions as patient is Jehovah's Witness   . Stroke [facility]) ~ 2005; ~ 2005   "they were mild; I didn't even notice I'd had them"; denies residual on 01/30/2014  . Subdural hematoma (Oct 2018)    Coumadin and heparin placed on hold at dc- pt did not FU w/ NS as recommended after dc so not on warfarin when admitted 04/03/17    Past Surgical History:  Procedure Laterality Date  . AV FISTULA PLACEMENT Left ~ 10/2013   "forearm"  . CARDIAC CATHETERIZATION N/A 01/07/2015   Procedure: Left Heart Cath and Coronary Angiography;  Surgeon: Troy Sine, MD;  Location: [place] [facility];  Service: Cardiovascular;  Laterality: N/A;  . CLOSED REDUCTION HIP DISLOCATION Right 1970's  . JOINT REPLACEMENT    . TOTAL HIP ARTHROPLASTY Right 1980  . TOTAL KNEE ARTHROPLASTY Right 01/24/2016   Procedure: TOTAL KNEE ARTHROPLASTY;  Surgeon: Dorna Leitz, MD;  Location: [place];  Service: Orthopedics;  Laterality: Right;  . TOTAL KNEE ARTHROPLASTY Left 06/26/2016   Procedure: TOTAL KNEE ARTHROPLASTY;  Surgeon: Dorna Leitz, MD;  Location: [place];  Service: Orthopedics;  Laterality: Left;    Social History   Socioeconomic History  . Marital status: Single    Spouse name: Not on file  . Number of children: 2  . Years of education: Not on file  . Highest education level: Not on file  Occupational History  . Not on file  Tobacco Use  . Smoking status: Never Smoker  . Smokeless tobacco: Never Used  Substance and Sexual Activity  . Alcohol use: Yes    Alcohol/week: 7.0 -  8.0 standard drinks    Types: 1 Cans of beer, 3 Shots of liquor, 3 - 4 Standard drinks or equivalent per week    Comment: 07/03/16 1 pint 2-3X/week  . Drug use: No  . Sexual activity: Not Currently  Other Topics Concern  . Not on file  Social History Narrative  . Not on file   Social Determinants of Health   Financial Resource Strain:   . Difficulty of Paying Living Expenses: Not on file  Food Insecurity:   . Worried About Charity fundraiser in the Last Year: Not on file  . Ran Out of Food in the Last Year: Not on file  Transportation Needs:   . Lack of Transportation (Medical): Not on file  . Lack of Transportation (Non-Medical): Not on file  Physical Activity:   . Days of Exercise per Week: Not on file  . Minutes of Exercise per Session: Not on file  Stress:   . Feeling of Stress : Not on file  Social Connections:   . Frequency of Communication with Friends and Family: Not on file  . Frequency of Social Gatherings with Friends and Family: Not on file  . Attends Religious Services: Not on file  . Active Member of Clubs or Organizations: Not on file  . Attends Archivist Meetings: Not on file  . Marital Status: Not on file  Intimate Partner Violence:   . Fear of Current or Ex-Partner: Not on file  . Emotionally Abused: Not on file  . Physically Abused: Not on file  . Sexually Abused: Not on file    Allergies  Allergen Reactions  . Tobacco [Tobacco] Shortness Of Breath  . Other     Perfume & cologne-eyes itchy    Family History  Problem Relation Age of Onset  . Arthritis Other   . Heart disease Other   . Hyperlipidemia Other   . Hypertension Other   . Kidney disease Other   . Diabetes Other   . Stroke Other   . Diabetes Mother   . Cancer Father        bone cancer  . Heart disease Father   . Diabetes Sister   . Diabetes Brother   . Stroke Brother     Prior to Admission medications   Medication Sig Start Date End Date Taking? Authorizing  Provider  amitriptyline (ELAVIL) 50 MG tablet Take 1 tablet (50 mg total) by mouth at bedtime. 02/28/18  Yes Rai, Ripudeep K, MD  b complex-vitamin c-folic acid (NEPHRO-VITE) 0.8 MG TABS tablet Take 1 tablet by mouth daily.   Yes [provider]  cinacalcet (SENSIPAR) 30 MG tablet Take 30 mg by mouth daily with supper. 12/23/17  Yes [provider]  clindamycin (CLEOCIN T) 1 % external solution Apply 1 application topically 2 (two) times daily.  Apply to face.   Yes [provider]  Dextromethorphan-Guaifenesin (MUCINEX DM) 30-600 MG TB12 Take 1 tablet by mouth daily as needed (congestion).    Yes [provider]  docusate sodium (COLACE) 100 MG capsule Take 100 mg by mouth daily as needed for mild constipation.    Yes [provider]  gabapentin (NEURONTIN) 100 MG capsule Take 2 capsules (200 mg total) by mouth at bedtime. 200 mg qam and 300 mg upon return from dialysis on M-W-F for neuropathy Patient taking differently: Take 100 mg by mouth 2 (two) times daily.  07/31/16  Yes Medina-Vargas, Monina C, NP  HYDROcodone-acetaminophen (NORCO/VICODIN) 5-325 MG tablet Take 1-2 tablets by mouth every 6 (six) hours as needed. 02/10/19  Yes Malvin Johns, MD  lactulose (CHRONULAC) 10 GM/15ML solution Take 20 g by mouth 2 (two) times daily as needed for mild constipation.   Yes [provider]  nystatin cream (MYCOSTATIN) Apply 1 application topically 2 (two) times daily as needed (rash).   Yes [provider]  simvastatin (ZOCOR) 20 MG tablet Take 1 tablet (20 mg total) by mouth daily. Patient taking differently: Take 20 mg by mouth at bedtime.  07/31/16  Yes Medina-Vargas, Monina C, NP  sodium chloride (OCEAN) 0.65 % SOLN nasal spray Place 1 spray into both nostrils as needed for congestion. 01/31/14  Yes Ghimire, Henreitta Leber, MD  tretinoin (RETIN-A) 0.05 % cream Apply 1 application topically at bedtime. Apply to face   Yes [provider]   allopurinol (ZYLOPRIM) 100 MG tablet Take 1 tablet (100 mg total) by mouth 2 (two) times daily. 07/31/16   Medina-Vargas, Jaymes Graff C, NP    Physical Exam: Vitals:   02/15/19 0830 02/15/19 0900 02/15/19 0908 02/15/19 0915  BP: (!) 150/103 (!) 142/88 (!) 152/84 (!) 142/83  Pulse:  95 90 78  Resp: 18 18    Temp:  97.7 F (36.5 C)    TempSrc:  Oral    SpO2:  98%    Weight:  108.8 kg    Height:         . General:  Appears somnolent with mildly increased WOB . Eyes:   EOMI, normal lids, iris . ENT:  Hard of hearing, grossly normal lips & tongue, mmm . Neck:  no LAD, masses or thyromegaly . Cardiovascular:  RRR, no m/r/g. No LE edema.  Marland Kitchen Respiratory:   CTA bilaterally with no wheezes/rales/rhonchi.  Mildly increased respiratory effort. . Abdomen:  soft, NT, ND, NABS . Skin:  no rash or induration seen on limited exam . Musculoskeletal:  grossly normal tone BUE/BLE, no bony abnormality . Psychiatric:  Quite somnolent, briefly wakes up to answer questions and then lapses back to sleep - unable to provide effective history but does follow commands . Neurologic:  unable to perform    Radiological Exams on Admission: DG Chest Portable 1 View  Result Date: 02/15/2019 CLINICAL DATA:  Shortness of breath for 2 days. Missed dialysis. EXAM: PORTABLE CHEST 1 VIEW COMPARISON:  02/24/2018 FINDINGS: Left dialysis catheter has been removed. Stable cardiomegaly and mediastinal contours suspect central vascular congestion. Low lung volumes limits detailed assessment. No focal airspace disease, large pleural effusion or pneumothorax. IMPRESSION: Stable cardiomegaly. Possible mild central vascular congestion. Overall low lung volumes which limit assessment. Electronically Signed   By: Keith Rake M.D.   On: 02/15/2019 05:34    EKG: Independently reviewed.   [id number] - Extreme wide-complex tachycardia, rate 176 (4 similarly concerning EKGs) 0527 - RBBB, still wide  complexes with uncertain rhythm and rate  57 0548 - NSR with rate 72; RBBB, peaked T waves, QTc 604 0651 - Atrial flutter with rate 86; RBBB, improving wide complex arrhythmia; QTc 623   Labs on Admission: I have personally reviewed the available labs and imaging studies at the time of the admission.  Pertinent labs:   Na++ 123 K+ >7.5 CO2 16 Glucose 121 BUN 92/Creatinine 14.03/GFR 4 Anion gap 19 Magnesium 2.6 HS troponin 30 VBG: 7.194/56.0/35.0/21.6 WBC 6.9 Hgb 11.5 INR 1.1 Respiratory panel negative   Assessment/Plan Principal Problem:   Hyperkalemia Active Problems:   Diabetes mellitus with neuropathy (HCC)   Hypertension   ESRD (end stage renal disease) on dialysis (HCC)   Hyperlipidemia   Chronic pain syndrome   Atypical atrial flutter (HCC)   Hypothyroid   Severe, life-threatening hyperkalemia -Patient with acute/subacute hyperkalemia which appears to be resulting from dietary indiscretion and missed HD and was life-threatening on admission -EKG with severe wide complex tachycardia, pacer pads placed -He was treated emergently with Albuterol continuous neb; calcium chloride; and sodium bicarbonate -His telemetry/EKG abnormalities stabilized and then he was taken upstairs for emergent HD -Will continue to closely monitor on telemetry in Progressive Care Unit -Follow K+ q4h for now -Markedly increased QTc - will recheck EKG at 1500 and d/c QTc-prolonging medications (Elavix, Atarax, Zofran)  ESRD with non-compliance -Patient on chronic MWF HD -Nephrology prn order set utilized -He missed HD Friday and Saturday, which appears to have led to life-threatening complications -The vast majority of his issues will likely be improved/resolved with HD today and possibly also tomorrow -Nephrology is consulting and has ordered HD -Ongoing counseling encouraged about the importance of attending HD -Continue nephrovite, Sensipar  HTN -He is no longer taking BP medications  Aflutter -He is not on  rate-controlling agents and is now back to good rate control -No AC given prior h/o SDH  DM -His last A1c was 6.2 in 2018 -He is diet controlled  -Appears to have ongoing good control, no SSI ordered  Chronic pain -Continue Neurontin -He was given limited one-time prescriptions for Tramadol and Norco in the last year and does not appear to have filled the recent rx he was given for Norco in the ER on 12/29  HLD -Continue Zocor  Hypothyroidism -reported to have this issue but not on medications -Last thyroid testing in Epic was in 2016 with depressed TSH (0.065) and elevated free T4 (1.55) - which appears to indicate oversuppression with iatrogenic hyperthyroidism -Will recheck TSH/T4 at this time   Note: This patient has been tested and is negative for the novel coronavirus COVID-19.   Total critical care time: 65 minutes Critical care time was exclusive of separately billable procedures and treating other patients. Critical care was necessary to treat or prevent imminent or life-threatening deterioration. Critical care was time spent personally by me on the following activities: development of treatment plan with patient and/or surrogate as well as nursing, discussions with consultants, evaluation of patient's response to treatment, examination of patient, obtaining history from patient or surrogate, ordering and performing treatments and interventions, ordering and review of laboratory studies, ordering and review of radiographic studies, pulse oximetry and re-evaluation of patient's condition.    DVT prophylaxis: Heparin Code Status:  Full - based on prior admissions Family Communication: None present; I left a voice mail message for his brother  Disposition Plan:  Home once clinically improved Consults called: Nephrology  Admission status: Admit - It is my clinical opinion  that admission to INPATIENT is reasonable and necessary because of the expectation that this patient will  require hospital care that crosses at least 2 midnights to treat this condition based on the medical complexity of the problems presented.  Given the aforementioned information, the predictability of an adverse outcome is felt to be significant.    Karmen Bongo MD [facility]   How to contact the [facility] Attending or Consulting provider [place] or covering provider during after hours [place], for this patient?  1. Check the care team in [facility] and look for a) attending/consulting [facility] provider listed and b) the [facility] team listed 2. Log into [email or web address] and use [place]'s universal password to access. If you do not have the password, please contact the hospital operator. 3. Locate the [facility] provider you are looking for under [facility] and page to a number that you can be directly reached. 4. If you still have difficulty reaching the provider, please page the [facility] (Director on Call) for the Hospitalists listed on amion for assistance.   02/15/2019, 9:35 AM

## 2019-02-15 NOTE — Progress Notes (Signed)
Pt. Arrived to HD unit with Bicarb drip infusing through PIV left hand this also is pt. AVF access arm. Dr. [name] paged and made aware orders to remove PIV left hand and d/c Bicarb drip

## 2019-02-15 NOTE — ED Provider Notes (Signed)
TIME SEEN: 5:03 AM  CHIEF COMPLAINT: Generalized weakness, shortness of breath  HPI: Patient is a 64 year old male with history of atrial fibrillation no longer on anticoagulation, CHF, diabetes, hypertension, end-stage renal disease on hemodialysis Monday, Wednesday and Friday who has been on dialysis for 10 years who presents to the emergency department with complaints of generalized weakness for the past several days and shortness of breath that started tonight.  No chest pain or chest discomfort.  No fevers, cough, vomiting, diarrhea.  No Covid exposures.  Placed on 2 L nasal cannula for comfort. No hypoxia with EMS. Does not wear oxygen chronically.   Last dialysis on 02/11/2019.  Scheduled for dialysis today at 12:30 PM.   ROS: See HPI Constitutional: no fever  Eyes: no drainage  ENT: no runny nose   Cardiovascular:  no chest pain  Resp: SOB  GI: no vomiting GU: no dysuria Integumentary: no rash  Allergy: no hives  Musculoskeletal: no leg swelling  Neurological: no slurred speech ROS otherwise negative  PAST MEDICAL HISTORY/PAST SURGICAL HISTORY:  Past Medical History:  Diagnosis Date  . Allergic rhinitis 07/07/2014  . Anemia due to other cause 07/07/2014  . Arthritis    "all over"  . Atrial fibrillation ([place]) 12/30/2012  . CHF (congestive heart failure) ([place]) 12/30/2012  . Chronic lower back pain   . Diabetes mellitus with neuropathy ([place])   . Diabetic retinopathy ([place]) 12/30/2012   no medications  . Dysrhythmia    Afib  . ESRD (end stage renal disease) on dialysis ([place]) started in 2013   MWF; [facility]; [employer]  . GERD (gastroesophageal reflux disease)   . Glaucoma   . History of gout    "right big toe"  . Hyperlipidemia   . Hypertension   . Hypothyroid    01/17/16- no longer on meduication  . Morbid obesity ([place]) 12/30/2012  . Myocardial infarction ([place])    "I've had ~ 3; last one was in ~ 10/2013" (01/30/2014)  . OSA (obstructive sleep apnea)  12/30/2012   "lost weight; no longer needed CPAP; retested said I needed it; didn't followup cause I was feeling fine" (01/30/2014)  . Pulmonary hypertension ([place]) 12/30/2012  . Refusal of blood transfusions as patient is Jehovah's Witness   . Stroke [facility]) ~ 2005; ~ 2005   "they were mild; I didn't even notice I'd had them"; denies residual on 01/30/2014  . Subdural hematoma (Oct 2018)    Coumadin and heparin placed on hold at dc- pt did not FU w/ NS as recommended after dc so not on warfarin when admitted 04/03/17    MEDICATIONS:  Prior to Admission medications   Medication Sig Start Date End Date Taking? Authorizing Provider  allopurinol (ZYLOPRIM) 100 MG tablet Take 1 tablet (100 mg total) by mouth 2 (two) times daily. 07/31/16   Medina-Vargas, Monina C, NP  amitriptyline (ELAVIL) 50 MG tablet Take 1 tablet (50 mg total) by mouth at bedtime. 02/28/18   Rai, Ripudeep K, MD  b complex-vitamin c-folic acid (NEPHRO-VITE) 0.8 MG TABS tablet Take 1 tablet by mouth daily.    [provider]  cinacalcet (SENSIPAR) 30 MG tablet Take 30 mg by mouth daily with supper. 12/23/17   [provider]  clindamycin (CLEOCIN T) 1 % external solution Apply 1 application topically 2 (two) times daily. Apply to face.    [provider]  Dextromethorphan-Guaifenesin (MUCINEX DM) 30-600 MG TB12 Take 1 tablet by mouth daily as needed (congestion).     [provider]  docusate sodium (COLACE) 100 MG capsule Take 100 mg by mouth daily as needed for mild constipation.     [provider]  gabapentin (NEURONTIN) 100 MG capsule Take 2 capsules (200 mg total) by mouth at bedtime. 200 mg qam and 300 mg upon return from dialysis on M-W-F for neuropathy Patient taking differently: Take 100-200 mg by mouth See admin instructions. 100mg  in the morning and 200mg  in the evening 07/31/16   Medina-Vargas, Monina C, NP  HYDROcodone-acetaminophen (NORCO/VICODIN) 5-325 MG tablet Take 1-2  tablets by mouth every 6 (six) hours as needed. 02/10/19   Malvin Johns, MD  lactulose (CHRONULAC) 10 GM/15ML solution Take 20 g by mouth 2 (two) times daily as needed for mild constipation.    [provider]  nystatin cream (MYCOSTATIN) Apply 1 application topically 2 (two) times daily as needed (rash).    [provider]  simvastatin (ZOCOR) 20 MG tablet Take 1 tablet (20 mg total) by mouth daily. Patient taking differently: Take 20 mg by mouth at bedtime.  07/31/16   Medina-Vargas, Monina C, NP  sodium chloride (OCEAN) 0.65 % SOLN nasal spray Place 1 spray into both nostrils as needed for congestion. 01/31/14   Ghimire, Henreitta Leber, MD  tretinoin (RETIN-A) 0.05 % cream Apply 1 application topically at bedtime. Apply to face    [provider]    ALLERGIES:  Allergies  Allergen Reactions  . Tobacco [Tobacco] Shortness Of Breath  . Other     Perfume & cologne-eyes itchy    SOCIAL HISTORY:  Social History   Tobacco Use  . Smoking status: Never Smoker  . Smokeless tobacco: Never Used  Substance Use Topics  . Alcohol use: Yes    Alcohol/week: 7.0 - 8.0 standard drinks    Types: 1 Cans of beer, 3 Shots of liquor, 3 - 4 Standard drinks or equivalent per week    Comment: 07/03/16 1 pint 2-3X/week    FAMILY HISTORY: Family History  Problem Relation Age of Onset  . Arthritis Other   . Heart disease Other   . Hyperlipidemia Other   . Hypertension Other   . Kidney disease Other   . Diabetes Other   . Stroke Other   . Diabetes Mother   . Cancer Father        bone cancer  . Heart disease Father   . Diabetes Sister   . Diabetes Brother   . Stroke Brother     EXAM: Pulse 98   Temp 98.5 F (36.9 C) (Oral)   Resp (!) 28   Ht 5\' 9"  (1.753 m)   Wt 108.9 kg   SpO2 99%   BMI 35.44 kg/m  CONSTITUTIONAL: Alert and oriented and responds appropriately to questions. Well-appearing; well-nourished, in NAD HEAD: Normocephalic EYES: Conjunctivae clear,  pupils appear equal, EOM appear intact ENT: normal nose; moist mucous membranes NECK: Supple, normal ROM CARD: regular and tachycardic; S1 and S2 appreciated; no murmurs, no clicks, no rubs, no gallops RESP: Normal chest excursion without splinting or tachypnea; breath sounds clear and equal bilaterally; no wheezes, no rhonchi, no rales, no hypoxia or respiratory distress, speaking full sentences ABD/GI: Normal bowel sounds; non-distended; soft, non-tender, no rebound, no guarding, no peritoneal signs, no hepatosplenomegaly BACK:  The back appears normal EXT: Normal ROM in all joints; no deformity noted, no edema; no cyanosis; AV fistula in LUE with normal thrill and no tenderness, redness or warmth; 2+ left radial pulse SKIN: Normal color for age and  race; warm; no rash on exposed skin NEURO: Moves all extremities equally; normal speech PSYCH: The patient's mood and manner are appropriate.   MEDICAL DECISION MAKING: Patient here with SOB and generalized weakness.  I was called to patient's bedside emergently as he was noted to be extremely tachycardic in the 200s with a wide-complex rhythm on the monitor.  He states he thinks his last dialysis was the 30th but is not completely sure and states it had to be changed around because of the holidays.  I suspect that patient is hyperkalemic.  Heart rate improved into the low 100s on my arrival into the room and then subsequently improved into the 50s.  He states he is not on anything to control his heart rate.  Will obtain labs, x-ray.  ED PROGRESS: Potassium level is 7.6, bicarb of 17, glucose 116, hemoglobin 12.9, sodium 119.  Given Plavix rhythm, will start medications including calcium, dextrose and insulin, albuterol, bicarb infusion.  Will consult nephrology.  He will need emergent dialysis.  Will obtain rapid Covid test.  Patient still mentating normally.  He denies any pain at this time.  He is normotensive.  5:40 AM  Spoke with Dr. [name] with  nephrology.  Patient will be dialyzed but needs COVID results first.  Rapid COVID obtained and taken to lab. Request medicine admission.   6:53 AM Discussed patient's case with hospitalist, Dr. [name].  I have recommended admission and patient (and family if present) agree with this plan. Admitting physician will place admission orders.   Patient is hemodynamically stable with blood pressure of 117/90 and heart rate in the 80s.  Currently states he is feeling better.  Repeat blood glucose 122.  Repeat EKG shows atrial fibrillation with right bundle branch block and QRS seems to be not as wide as previous.  I reviewed all nursing notes, vitals, pertinent previous records and interpreted all EKGs, lab and urine results, imaging (as available).  Patient is Covid negative.   EKG Interpretation  Date/Time:  Sunday February 15 2019 04:57:34 EST Ventricular Rate:  107 PR Interval:    QRS Duration: 186 QT Interval:  407 QTC Calculation: 544 R Axis:   -144 Text Interpretation: Uncertain rhythm: review RAE, consider biatrial enlargement Right bundle branch block ST depression, consider ischemia, lateral lds Confirmed by Lakeeta Dobosz, Cyril Mourning [phone]) on 02/15/2019 5:13:00 AM        EKG Interpretation  Date/Time:  Sunday February 15 2019 05:05:29 EST Ventricular Rate:  170 PR Interval:    QRS Duration: 112 QT Interval:  108 QTC Calculation: 181 R Axis:   67 Text Interpretation:  Critical Test Result: Arrhythmia Undetermined rhythm Low voltage QRS Possible Lateral infarct , age undetermined Abnormal ECG Confirmed by Calyse Murcia, Cyril Mourning [phone]) on 02/15/2019 5:13:28 AM         EKG Interpretation  Date/Time:  Sunday February 15 2019 06:51:29 EST Ventricular Rate:  86 PR Interval:    QRS Duration: 176 QT Interval:  520 QTC Calculation: 623 R Axis:   -136 Text Interpretation: Atrial fibrillation Right bundle branch block Wide complex arrythmia is improving Confirmed by Pryor Curia [phone]) on 02/15/2019  6:54:46 AM         CRITICAL CARE Performed by: Cyril Mourning Evertt Chouinard   Total critical care time: 65 minutes  Critical care time was exclusive of separately billable procedures and treating other patients.  Critical care was necessary to treat or prevent imminent or life-threatening deterioration.  Critical care was time spent personally by me on the  following activities: development of treatment plan with patient and/or surrogate as well as nursing, discussions with consultants, evaluation of patient's response to treatment, examination of patient, obtaining history from patient or surrogate, ordering and performing treatments and interventions, ordering and review of laboratory studies, ordering and review of radiographic studies, pulse oximetry and re-evaluation of patient's condition.   Trayvone Bonitz was evaluated in Emergency Department on 02/15/2019 for the symptoms described in the history of present illness. He was evaluated in the context of the global COVID-19 pandemic, which necessitated consideration that the patient might be at risk for infection with the SARS-CoV-2 virus that causes COVID-19. Institutional protocols and algorithms that pertain to the evaluation of patients at risk for COVID-19 are in a state of rapid change based on information released by regulatory bodies including the CDC and federal and state organizations. These policies and algorithms were followed during the patient's care in the ED.  Patient was seen wearing N95, face shield, gloves.    Eytan Carrigan, Delice Bison, DO 02/15/19 [phone]

## 2019-02-16 DIAGNOSIS — N186 End stage renal disease: Secondary | ICD-10-CM | POA: Diagnosis not present

## 2019-02-16 DIAGNOSIS — D631 Anemia in chronic kidney disease: Secondary | ICD-10-CM | POA: Diagnosis not present

## 2019-02-16 DIAGNOSIS — E875 Hyperkalemia: Secondary | ICD-10-CM | POA: Diagnosis not present

## 2019-02-16 DIAGNOSIS — Z992 Dependence on renal dialysis: Secondary | ICD-10-CM | POA: Diagnosis not present

## 2019-02-16 DIAGNOSIS — N250 Renal osteodystrophy: Secondary | ICD-10-CM | POA: Diagnosis not present

## 2019-02-16 LAB — BASIC METABOLIC PANEL
Anion gap: 16 — ABNORMAL HIGH (ref 5–15)
BUN: 57 mg/dL — ABNORMAL HIGH (ref 8–23)
CO2: 21 mmol/L — ABNORMAL LOW (ref 22–32)
Calcium: 7.4 mg/dL — ABNORMAL LOW (ref 8.9–10.3)
Chloride: 93 mmol/L — ABNORMAL LOW (ref 98–111)
Creatinine, Ser: 10.18 mg/dL — ABNORMAL HIGH (ref 0.61–1.24)
GFR calc Af Amer: 6 mL/min — ABNORMAL LOW (ref >60)
GFR calc non Af Amer: 5 mL/min — ABNORMAL LOW (ref >60)
Glucose, Bld: 142 mg/dL — ABNORMAL HIGH (ref 70–99)
Potassium: 5.6 mmol/L — ABNORMAL HIGH (ref 3.5–5.1)
Sodium: 130 mmol/L — ABNORMAL LOW (ref 135–145)

## 2019-02-16 LAB — CBC
HCT: 30 % — ABNORMAL LOW (ref 39.0–52.0)
Hemoglobin: 9.9 g/dL — ABNORMAL LOW (ref 13.0–17.0)
MCH: 32.4 pg (ref 26.0–34.0)
MCHC: 33 g/dL (ref 30.0–36.0)
MCV: 98 fL (ref 80.0–100.0)
Platelets: 181 10*3/uL (ref 150–400)
RBC: 3.06 MIL/uL — ABNORMAL LOW (ref 4.22–5.81)
RDW: 14.3 % (ref 11.5–15.5)
WBC: 6.1 10*3/uL (ref 4.0–10.5)
nRBC: 0 % (ref 0.0–0.2)

## 2019-02-16 LAB — POTASSIUM: Potassium: 5.1 mmol/L (ref 3.5–5.1)

## 2019-02-16 MED ORDER — HEPARIN SODIUM (PORCINE) 1000 UNIT/ML DIALYSIS: 1000.0000 [IU] | INTRAMUSCULAR | Status: DC | PRN

## 2019-02-16 MED ORDER — LIDOCAINE-PRILOCAINE 2.5-2.5 % EX CREA: 1.0000 "application " | TOPICAL_CREAM | CUTANEOUS | Status: DC | PRN

## 2019-02-16 MED ORDER — SODIUM CHLORIDE 0.9 % IV SOLN: 100.0000 mL | INTRAVENOUS | Status: DC | PRN

## 2019-02-16 MED ORDER — HEPARIN SODIUM (PORCINE) 1000 UNIT/ML DIALYSIS: 3000.0000 [IU] | Freq: Once | INTRAMUSCULAR | Status: DC

## 2019-02-16 MED ORDER — PENTAFLUOROPROP-TETRAFLUOROETH EX AERO: 1.0000 "application " | INHALATION_SPRAY | CUTANEOUS | Status: DC | PRN

## 2019-02-16 MED ORDER — CHLORHEXIDINE GLUCONATE CLOTH 2 % EX PADS: 6.0000 | MEDICATED_PAD | Freq: Every day | CUTANEOUS | Status: DC

## 2019-02-16 MED ORDER — ALTEPLASE 2 MG IJ SOLR: 2.0000 mg | Freq: Once | INTRAMUSCULAR | Status: DC | PRN

## 2019-02-16 MED ORDER — LIDOCAINE HCL (PF) 1 % IJ SOLN: 5.0000 mL | INTRAMUSCULAR | Status: DC | PRN

## 2019-02-16 NOTE — Care Management Obs Status (Signed)
[place] NOTIFICATION   Patient Details  Name: Daniel Whitney MRN: [id number] Date of Birth: 03/18/1955   Medicare Observation Status Notification Given:  Yes    Geralynn Ochs, LCSW 02/16/2019, 2:07 PM

## 2019-02-16 NOTE — Discharge Summary (Signed)
Physician Discharge Summary  Daniel Whitney [id number] DOB: May 07, 1955 DOA: 02/15/2019  PCP: [name], MD  Admit date: 02/15/2019 Discharge date: 02/16/2019  Admitted From: Home Disposition:  Home  Recommendations for Outpatient Follow-up:  1. Follow up with PCP in 1 week 2. HD MWF   Discharge Condition: Stable CODE STATUS: Full  Diet recommendation: Renal diet   Brief/Interim Summary: From H&P per Dr. [name]: "HPI: Daniel Whitney is a 64 y.o. male with medical history significant of SDH (2018); CVA; Jehovah's Witness; pulm HTN; OSA not on CPAP; obesity (BMI 35); hypothyroidism; HTN; HLD; SRD on MWF HD; afib not on AC; DM; and chronic CHF presenting with weakness, SOB.  He reports that "my diet was wrong."  He was taking his medicine wrong.  He missed his last sessions of HD.  He is too somnolent to answer additional questions at this time.  He was seen at [facility] on 12/29 for a fall with R knee pain. Evaluation was unremarkable and he was plated in a knee sleeve and given hydrocodone for pain.  ED Course:  Carryover, per Dr. [name]: This is a 89 yom with hx of ESRD, a fib no longer anticoagulated, presenting with SOB after missing 2 dialysis sessions, found to have critical hyperkalemia wide-complex tachycardia. He was given temporizing medications and QRS has narrowed and rate normalized on the monitor, patient reports feeling much better. Dr. [name] of nephrology is arranging dialysis. Repeat K+ and EKG ordered."  Patient underwent urgent HD on 1/3 and regularly scheduled HD 1/4 prior to discharge. On day of discharge, he was feeling well, denied any complaints of chest pain, SOB.   Discharge Diagnoses:  Principal Problem:   Hyperkalemia Active Problems:   Diabetes mellitus with neuropathy (HCC)   Hypertension   ESRD (end stage renal disease) on dialysis (HCC)   Hyperlipidemia   Chronic pain syndrome   Atypical atrial flutter (HCC)   Hypothyroid   Prolonged QT  interval   Discharge Instructions  Discharge Instructions    Call MD for:  difficulty breathing, headache or visual disturbances   Complete by: As directed    Call MD for:  extreme fatigue   Complete by: As directed    Call MD for:  persistant dizziness or light-headedness   Complete by: As directed    Call MD for:  persistant nausea and vomiting   Complete by: As directed    Call MD for:  severe uncontrolled pain   Complete by: As directed    Call MD for:  temperature >100.4   Complete by: As directed    Discharge instructions   Complete by: As directed    You were cared for by a hospitalist during your hospital stay. If you have any questions about your discharge medications or the care you received while you were in the hospital after you are discharged, you can call the unit and ask to speak with the hospitalist on call if the hospitalist that took care of you is not available. Once you are discharged, your primary care physician will handle any further medical issues. Please note that NO REFILLS for any discharge medications will be authorized once you are discharged, as it is imperative that you return to your primary care physician (or establish a relationship with a primary care physician if you do not have one) for your aftercare needs so that they can reassess your need for medications and monitor your lab values.   Increase activity slowly   Complete by: As  directed      Allergies as of 02/16/2019      Reactions   Tobacco [tobacco] Shortness Of Breath   Other    Perfume & cologne-eyes itchy      Medication List    TAKE these medications   allopurinol 100 MG tablet Commonly known as: ZYLOPRIM Take 1 tablet (100 mg total) by mouth 2 (two) times daily.   amitriptyline 50 MG tablet Commonly known as: ELAVIL Take 1 tablet (50 mg total) by mouth at bedtime.   b complex-vitamin c-folic acid 0.8 MG Tabs tablet Take 1 tablet by mouth daily.   cinacalcet 30 MG  tablet Commonly known as: SENSIPAR Take 30 mg by mouth daily with supper.   clindamycin 1 % external solution Commonly known as: CLEOCIN T Apply 1 application topically 2 (two) times daily. Apply to face.   docusate sodium 100 MG capsule Commonly known as: COLACE Take 100 mg by mouth daily as needed for mild constipation.   gabapentin 100 MG capsule Commonly known as: NEURONTIN Take 2 capsules (200 mg total) by mouth at bedtime. 200 mg qam and 300 mg upon return from dialysis on M-W-F for neuropathy What changed:   how much to take  when to take this  additional instructions   HYDROcodone-acetaminophen 5-325 MG tablet Commonly known as: NORCO/VICODIN Take 1-2 tablets by mouth every 6 (six) hours as needed.   lactulose 10 GM/15ML solution Commonly known as: CHRONULAC Take 20 g by mouth 2 (two) times daily as needed for mild constipation.   Mucinex DM 30-600 MG Tb12 Take 1 tablet by mouth daily as needed (congestion).   nystatin cream Commonly known as: MYCOSTATIN Apply 1 application topically 2 (two) times daily as needed (rash).   simvastatin 20 MG tablet Commonly known as: ZOCOR Take 1 tablet (20 mg total) by mouth daily. What changed: when to take this   sodium chloride 0.65 % Soln nasal spray Commonly known as: OCEAN Place 1 spray into both nostrils as needed for congestion.   tretinoin 0.05 % cream Commonly known as: RETIN-A Apply 1 application topically at bedtime. Apply to face      Follow-up Information    Koirala, Dibas, MD. Schedule an appointment as soon as possible for a visit in 1 week(s).   Specialty: Family Medicine Contact information: [address] [place] [address] [phone]          Allergies  Allergen Reactions  . Tobacco [Tobacco] Shortness Of Breath  . Other     Perfume & cologne-eyes itchy    Consultations:  Nephrology    Procedures/Studies: DG Chest Portable 1 View  Result Date:  02/15/2019 CLINICAL DATA:  Shortness of breath for 2 days. Missed dialysis. EXAM: PORTABLE CHEST 1 VIEW COMPARISON:  02/24/2018 FINDINGS: Left dialysis catheter has been removed. Stable cardiomegaly and mediastinal contours suspect central vascular congestion. Low lung volumes limits detailed assessment. No focal airspace disease, large pleural effusion or pneumothorax. IMPRESSION: Stable cardiomegaly. Possible mild central vascular congestion. Overall low lung volumes which limit assessment. Electronically Signed   By: Keith Rake M.D.   On: 02/15/2019 05:34   DG Knee Complete 4 Views Right  Result Date: 02/10/2019 CLINICAL DATA:  Pain status post fall EXAM: RIGHT KNEE - COMPLETE 4+ VIEW COMPARISON:  August 22, 2017 FINDINGS: Patient is status post total knee arthroplasty on the right. Hardware appears grossly intact. Advanced vascular calcifications are noted. Again noted is a high riding patella somewhat similar to prior  study in 2019 given differences in technique. There are new calcifications projecting anterior to the joint space which may reside within the patellar tendon. There is a small joint effusion. Osteopenia is noted. IMPRESSION: 1. No acute displaced fracture or dislocation. 2. Again noted is a high riding patella, similar to prior study in 2019. There are calcifications in the region of the patellar tendon. Overall, these findings are concerning for a chronic patellar tendon injury or rupture. 3. Small joint effusion. 4. Status post arthroplasty without evidence for hardware fracture. 5. Advanced vascular calcifications are noted. Electronically Signed   By: Constance Holster M.D.   On: 02/10/2019 19:09       Discharge Exam: Vitals:   02/16/19 0842 02/16/19 1126  BP: (!) 115/51 (!) 109/52  Pulse: 83 80  Resp: 18 18  Temp: 99.6 F (37.6 C) 98.6 F (37 C)  SpO2: 94% 96%    General: Pt is alert, awake, not in acute distress Cardiovascular: RRR, S1/S2 +, no edema Respiratory:  CTA bilaterally, no wheezing, no rhonchi, no respiratory distress, no conversational dyspnea  Abdominal: Soft, NT, ND, bowel sounds + Extremities: no edema, no cyanosis Psych: Normal mood and affect, stable judgement and insight     The results of significant diagnostics from this hospitalization (including imaging, microbiology, ancillary and laboratory) are listed below for reference.     Microbiology: Recent Results (from the past 240 hour(s))  Respiratory Panel by RT PCR (Flu A&B, Covid) - Nasopharyngeal Swab     Status: None   Collection Time: 02/15/19  5:42 AM   Specimen: Nasopharyngeal Swab  Result Value Ref Range Status   SARS Coronavirus 2 by RT PCR NEGATIVE NEGATIVE Final    Comment: (NOTE) SARS-CoV-2 target nucleic acids are NOT DETECTED. The SARS-CoV-2 RNA is generally detectable in upper respiratoy specimens during the acute phase of infection. The lowest concentration of SARS-CoV-2 viral copies this assay can detect is 131 copies/mL. A negative result does not preclude SARS-Cov-2 infection and should not be used as the sole basis for treatment or other patient management decisions. A negative result may occur with  improper specimen collection/handling, submission of specimen other than nasopharyngeal swab, presence of viral mutation(s) within the areas targeted by this assay, and inadequate number of viral copies (<131 copies/mL). A negative result must be combined with clinical observations, patient history, and epidemiological information. The expected result is Negative. Fact Sheet for Patients:  PinkCheek.be Fact Sheet for Healthcare Providers:  GravelBags.it This test is not yet ap proved or cleared by the [place] FDA and  has been authorized for detection and/or diagnosis of SARS-CoV-2 by FDA under an Emergency Use Authorization (EUA). This EUA will remain  in effect (meaning this test can be  used) for the duration of the COVID-19 declaration under Section 564(b)(1) of the Act, 21 U.S.C. section 360bbb-3(b)(1), unless the authorization is terminated or revoked sooner.    Influenza A by PCR NEGATIVE NEGATIVE Final   Influenza B by PCR NEGATIVE NEGATIVE Final    Comment: (NOTE) The Xpert Xpress SARS-CoV-2/FLU/RSV assay is intended as an aid in  the diagnosis of influenza from Nasopharyngeal swab specimens and  should not be used as a sole basis for treatment. Nasal washings and  aspirates are unacceptable for Xpert Xpress SARS-CoV-2/FLU/RSV  testing. Fact Sheet for Patients: PinkCheek.be Fact Sheet for Healthcare Providers: GravelBags.it This test is not yet approved or cleared by the [place] FDA and  has been authorized for detection and/or diagnosis of  SARS-CoV-2 by  FDA under an Emergency Use Authorization (EUA). This EUA will remain  in effect (meaning this test can be used) for the duration of the  Covid-19 declaration under Section 564(b)(1) of the Act, 21  U.S.C. section 360bbb-3(b)(1), unless the authorization is  terminated or revoked. Performed at [facility], [place] [address] [place], [place] [address]      Labs: BNP (last 3 results) No results for input(s): BNP in the last 8760 hours. Basic Metabolic Panel: Recent Labs  Lab 02/15/19 0525 02/15/19 0541 02/15/19 0611 02/15/19 1432 02/15/19 1812 02/15/19 2238 02/16/19 0336 02/16/19 0931  NA 119* 123* 121*  --   --   --   --  130*  K 7.6* >7.5* 7.7* 4.5 4.9 4.8 5.1 5.6*  CL 95* 88*  --   --   --   --   --  93*  CO2  --  16*  --   --   --   --   --  21*  GLUCOSE 116* 121*  --   --   --   --   --  142*  BUN 92* 92*  --   --   --   --   --  57*  CREATININE 15.90* 14.03*  --   --   --   --   --  10.18*  CALCIUM  --  7.5*  --   --   --   --   --  7.4*  MG  --  2.6*  --   --   --   --   --   --    Liver Function Tests: No results for  input(s): AST, ALT, ALKPHOS, BILITOT, PROT, ALBUMIN in the last 168 hours. No results for input(s): LIPASE, AMYLASE in the last 168 hours. No results for input(s): AMMONIA in the last 168 hours. CBC: Recent Labs  Lab 02/15/19 0525 02/15/19 0611 02/15/19 0613 02/16/19 0931  WBC  --   --  6.9 6.1  NEUTROABS  --   --  5.5  --   HGB 12.9* 12.2* 11.5* 9.9*  HCT 38.0* 36.0* 35.7* 30.0*  MCV  --   --  101.1* 98.0  PLT  --   --  195 181   Cardiac Enzymes: No results for input(s): CKTOTAL, CKMB, CKMBINDEX, TROPONINI in the last 168 hours. BNP: Invalid input(s): POCBNP CBG: Recent Labs  Lab 02/15/19 0650  GLUCAP 122*   D-Dimer No results for input(s): DDIMER in the last 72 hours. Hgb A1c No results for input(s): HGBA1C in the last 72 hours. Lipid Profile No results for input(s): CHOL, HDL, LDLCALC, TRIG, CHOLHDL, LDLDIRECT in the last 72 hours. Thyroid function studies Recent Labs    02/15/19 1432  TSH 2.578   Anemia work up No results for input(s): VITAMINB12, FOLATE, FERRITIN, TIBC, IRON, RETICCTPCT in the last 72 hours. Urinalysis    Component Value Date/Time   COLORURINE YELLOW 01/17/2016 1047   APPEARANCEUR CLOUDY (A) 01/17/2016 1047   LABSPEC 1.016 01/17/2016 1047   PHURINE 5.0 01/17/2016 1047   GLUCOSEU NEGATIVE 01/17/2016 1047   GLUCOSEU NEGATIVE 12/30/2012 1103   HGBUR LARGE (A) 01/17/2016 1047   BILIRUBINUR SMALL (A) 01/17/2016 1047   KETONESUR NEGATIVE 01/17/2016 1047   PROTEINUR 100 (A) 01/17/2016 1047   UROBILINOGEN 1.0 08/25/2014 0641   NITRITE NEGATIVE 01/17/2016 1047   LEUKOCYTESUR LARGE (A) 01/17/2016 1047   Sepsis Labs Invalid input(s): PROCALCITONIN,  WBC,  LACTICIDVEN Microbiology Recent  Results (from the past 240 hour(s))  Respiratory Panel by RT PCR (Flu A&B, Covid) - Nasopharyngeal Swab     Status: None   Collection Time: 02/15/19  5:42 AM   Specimen: Nasopharyngeal Swab  Result Value Ref Range Status   SARS Coronavirus 2 by RT PCR  NEGATIVE NEGATIVE Final    Comment: (NOTE) SARS-CoV-2 target nucleic acids are NOT DETECTED. The SARS-CoV-2 RNA is generally detectable in upper respiratoy specimens during the acute phase of infection. The lowest concentration of SARS-CoV-2 viral copies this assay can detect is 131 copies/mL. A negative result does not preclude SARS-Cov-2 infection and should not be used as the sole basis for treatment or other patient management decisions. A negative result may occur with  improper specimen collection/handling, submission of specimen other than nasopharyngeal swab, presence of viral mutation(s) within the areas targeted by this assay, and inadequate number of viral copies (<131 copies/mL). A negative result must be combined with clinical observations, patient history, and epidemiological information. The expected result is Negative. Fact Sheet for Patients:  PinkCheek.be Fact Sheet for Healthcare Providers:  GravelBags.it This test is not yet ap proved or cleared by the [place] FDA and  has been authorized for detection and/or diagnosis of SARS-CoV-2 by FDA under an Emergency Use Authorization (EUA). This EUA will remain  in effect (meaning this test can be used) for the duration of the COVID-19 declaration under Section 564(b)(1) of the Act, 21 U.S.C. section 360bbb-3(b)(1), unless the authorization is terminated or revoked sooner.    Influenza A by PCR NEGATIVE NEGATIVE Final   Influenza B by PCR NEGATIVE NEGATIVE Final    Comment: (NOTE) The Xpert Xpress SARS-CoV-2/FLU/RSV assay is intended as an aid in  the diagnosis of influenza from Nasopharyngeal swab specimens and  should not be used as a sole basis for treatment. Nasal washings and  aspirates are unacceptable for Xpert Xpress SARS-CoV-2/FLU/RSV  testing. Fact Sheet for Patients: PinkCheek.be Fact Sheet for Healthcare  Providers: GravelBags.it This test is not yet approved or cleared by the [place] FDA and  has been authorized for detection and/or diagnosis of SARS-CoV-2 by  FDA under an Emergency Use Authorization (EUA). This EUA will remain  in effect (meaning this test can be used) for the duration of the  Covid-19 declaration under Section 564(b)(1) of the Act, 21  U.S.C. section 360bbb-3(b)(1), unless the authorization is  terminated or revoked. Performed at [facility], [place] [address], [place], [place] [address]      Patient was seen and examined on the day of discharge and was found to be in stable condition. Time coordinating discharge: 35 minutes including assessment and coordination of care, as well as examination of the patient.   SIGNED:  Dessa Phi, DO [facility] 02/16/2019, 1:19 PM

## 2019-02-16 NOTE — Progress Notes (Signed)
Subjective:   No cos , tolerated HD yest  Plan for hd todayt then could dc home  Objective Vital signs in last 24 hours: Vitals:   02/15/19 2200 02/15/19 2349 02/16/19 0345 02/16/19 0842  BP:  119/72 (!) 144/56 (!) 115/51  Pulse: 76 70 75 83  Resp: 18 19 18 18   Temp:  98.6 F (37 C) 98.7 F (37.1 C) 99.6 F (37.6 C)  TempSrc:  Oral Oral Oral  SpO2: 94% 98% 96% 94%  Weight:      Height:       Weight change: -0.063 kg  Physical Exam: General:alert NAD OX3  No jvd  Lungs=Chest clear post and lat bilat CArd=RR no MRG Abd = Obese ,soft ntnd no mass or ascites +bs  Ext = trace  +pretib edema, no wounds or ulcers L AVF+bruit   Outpt HD: [place] MWF  4h 48min 450/800  111.5kg  AVF  Hep 3000  - meds pending  - missed 1/2 HD  - no BP lowering meds    EKG severe QRS widening    CXR - no edema, poss vasc congestion   Problem/Plan: 1. Severe hyperkalemia - 2/2 missed HD  Poor diet choice (Diet ^ k Foods) HD last pm and  am k 5.6   For hd today then could be dc   2. ESRD - on HD MWF 3. Vol/htn =bp ok , no meds/ uf on hd  fu post hd  wts bp /   4. H/o CVA 5. Anemia ckd hgb 9.9  esa  Fu op records  6. MBD=  [place] / vit on hd  Fu as op labs   Ernest Haber, PA-C [facility] [phone] 02/16/2019,10:44 AM  LOS: 1 day   Labs: Basic Metabolic Panel: Recent Labs  Lab 02/15/19 0525 02/15/19 0541 02/15/19 0611 02/15/19 2238 02/16/19 0336 02/16/19 0931  NA 119* 123* 121*  --   --  130*  K 7.6* >7.5* 7.7* 4.8 5.1 5.6*  CL 95* 88*  --   --   --  93*  CO2  --  16*  --   --   --  21*  GLUCOSE 116* 121*  --   --   --  142*  BUN 92* 92*  --   --   --  57*  CREATININE 15.90* 14.03*  --   --   --  10.18*  CALCIUM  --  7.5*  --   --   --  7.4*   Liver Function Tests: No results for input(s): AST, ALT, ALKPHOS, BILITOT, PROT, ALBUMIN in the last 168 hours. No results for input(s): LIPASE, AMYLASE in the last 168 hours. No results for input(s): AMMONIA in  the last 168 hours. CBC: Recent Labs  Lab 02/15/19 0611 02/15/19 0613 02/16/19 0931  WBC  --  6.9 6.1  NEUTROABS  --  5.5  --   HGB 12.2* 11.5* 9.9*  HCT 36.0* 35.7* 30.0*  MCV  --  101.1* 98.0  PLT  --  195 181   Cardiac Enzymes: No results for input(s): CKTOTAL, CKMB, CKMBINDEX, TROPONINI in the last 168 hours. CBG: Recent Labs  Lab 02/15/19 0650  GLUCAP 122*    Studies/Results: DG Chest Portable 1 View  Result Date: 02/15/2019 CLINICAL DATA:  Shortness of breath for 2 days. Missed dialysis. EXAM: PORTABLE CHEST 1 VIEW COMPARISON:  02/24/2018 FINDINGS: Left dialysis catheter has been removed. Stable cardiomegaly and mediastinal contours suspect central vascular congestion. Low lung  volumes limits detailed assessment. No focal airspace disease, large pleural effusion or pneumothorax. IMPRESSION: Stable cardiomegaly. Possible mild central vascular congestion. Overall low lung volumes which limit assessment. Electronically Signed   By: Keith Rake M.D.   On: 02/15/2019 05:34   Medications:  . allopurinol  100 mg Oral BID  . Chlorhexidine Gluconate Cloth  6 each Topical Q0600  . Chlorhexidine Gluconate Cloth  6 each Topical Q0600  . cinacalcet  30 mg Oral Q supper  . docusate sodium  100 mg Oral BID  . gabapentin  100 mg Oral BID  . heparin  5,000 Units Subcutaneous Q8H  . multivitamin  1 tablet Oral QHS  . simvastatin  20 mg Oral QHS  . sodium chloride flush  3 mL Intravenous Q12H

## 2019-02-16 NOTE — Care Management CC44 (Signed)
Condition Code 44 Documentation Completed  Patient Details  Name: Daniel Whitney MRN: [id number] Date of Birth: Oct 24, 1955   Condition Code 44 given:  Yes Patient signature on Condition Code 44 notice:  Yes Documentation of 2 MD's agreement:  Yes Code 44 added to claim:  Yes    Geralynn Ochs, [place] 02/16/2019, 2:07 PM

## 2019-02-16 NOTE — TOC Initial Note (Signed)
Transition of Care [facility]) - Initial/Assessment Note    Patient Details  Name: Daniel Whitney MRN: [id number] Date of Birth: 1955-07-22  Transition of Care [facility]) CM/SW Contact:    Geralynn Ochs, LCSW Phone Number: 02/16/2019, 11:12 AM  Clinical Narrative:    CSW met with patient to discuss missed HD sessions and address any barriers or needs. Patient indicated that he doesn't normally miss HD, but got things messed up with both the holiday schedule changing his days and him forgetting about it as well as eating things that he shouldn't have over the holiday and not feeling well. Patient discussed how he needs to do better in paying attention, and in heeding advice on what he can and can't consume with regard to his renal function. Patient indicated no barriers with transportation, but did mention that he would like to look into finding an ALF. Patient said his brother is currently in SNF and he has no one else at home, and his other family lives outside of the state ([place] and [place] CSW provided patient with a list of ALF in the area and patient agreed to research and work on finding placement. No other needs identified at this time.   Expected Discharge Plan: Home/Self Care Barriers to Discharge: Continued Medical Work up   Patient Goals and CMS Choice Patient states their goals for this hospitalization and ongoing recovery are:: get better, get into an assisted living      Expected Discharge Plan and Services Expected Discharge Plan: Home/Self Care       Living arrangements for the past 2 months: Apartment                                      Prior Living Arrangements/Services Living arrangements for the past 2 months: Apartment Lives with:: Siblings Patient language and need for interpreter reviewed:: No Do you feel safe going back to the place where you live?: Yes      Need for Family Participation in Patient Care: No (Comment) Care giver support system in place?: No  (comment)   Criminal Activity/Legal Involvement Pertinent to Current Situation/Hospitalization: No - Comment as needed  Activities of Daily Living Home Assistive Devices/Equipment: Walker (specify type)(front wheel) ADL Screening (condition at time of admission) Patient's cognitive ability adequate to safely complete daily activities?: Yes Is the patient deaf or have difficulty hearing?: No Does the patient have difficulty seeing, even when wearing glasses/contacts?: No Does the patient have difficulty concentrating, remembering, or making decisions?: No Patient able to express need for assistance with ADLs?: Yes Does the patient have difficulty dressing or bathing?: No Independently performs ADLs?: Yes (appropriate for developmental age) Does the patient have difficulty walking or climbing stairs?: No Weakness of Legs: Right Weakness of Arms/Hands: None  Permission Sought/Granted                  Emotional Assessment Appearance:: Appears stated age Attitude/Demeanor/Rapport: Engaged Affect (typically observed): Pleasant Orientation: : Oriented to Self, Oriented to Place, Oriented to  Time, Oriented to Situation      Admission diagnosis:  Shortness of breath [R06.02] Hyperkalemia [E87.5] Hyponatremia [Y17.4] Metabolic acidosis [B44.9] Generalized weakness [R53.1] Patient Active Problem List   Diagnosis Date Noted  . Prolonged QT interval 02/15/2019  . Shock ([place]) 02/24/2018  . Hyperkalemia, diminished renal excretion 01/01/2018  . Syncope and collapse 04/03/2017  . History of CVA (cerebrovascular accident) 04/03/2017  .  CKD (chronic kidney disease) stage V requiring chronic dialysis ([place]) 04/03/2017  . Hypothyroid 04/03/2017  . Refusal of blood transfusions as patient is Jehovah's Witness 04/03/2017  . Prediabetes-HgbA1c 6.2 04/03/2017  . History of subdural hematoma Oct 2018 04/03/2017  . 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 Chronic atrial fibrillation 04/03/2017  . Subdural hematoma ([place]) 11/29/2016  . Peripheral neuropathy 07/26/2016  . At risk for adverse drug reaction 07/03/2016  . Primary osteoarthritis of left knee 06/26/2016  . Postoperative anemia due to acute blood loss 01/27/2016  . S/P knee replacement 01/24/2016  . Low back pain 02/09/2015  . Bilateral knee pain 01/27/2015  . Atypical atrial flutter ([place]) 01/24/2015  . Acute on chronic combined systolic and diastolic heart failure ([place]) 01/05/2015  . Hyperkalemia 11/08/2014  . Hypocalcemia 11/08/2014  . Recurrent falls 10/13/2014  . Venous stasis ulcer ([place]) 10/13/2014  . Hypoxia   . Leukocytosis   . Respiratory failure ([place])   . ARDS (adult respiratory distress syndrome) ([place])   . Encounter for central line placement   . Cardiopulmonary arrest ([place])   . Compression fracture of L3 lumbar vertebra 08/04/2014  . Anemia of chronic renal failure, unspecified stage 07/07/2014  . Allergic rhinitis 07/07/2014  . Anemia of chronic disease 07/07/2014  . Right rotator cuff tear 04/27/2014  . Acute gouty arthritis 11/11/2013  . Hearing loss in right ear 11/11/2013  . Chronic pain syndrome 11/11/2013  . Right shoulder pain 11/11/2013  . Facial rash 11/01/2013  . Right otitis externa 10/27/2013  . Pulmonary HTN- PA 58 mmHg 04/16/13 09/17/2013  . Right hip pain 08/04/2013  . DOE (dyspnea on exertion) 06/18/2013  . Allergic rhinitis, cause unspecified 06/18/2013  . Carpal tunnel syndrome 06/06/2013  . Primary localized osteoarthrosis, lower leg 06/04/2013  . Preoperative cardiovascular examination 03/10/2013  . Preventative health care 12/30/2012  . AF (paroxysmal atrial fibrillation) ([place]) 12/30/2012  . CHF (congestive heart failure) ([place]) 12/30/2012  . Diabetic retinopathy ([place]) 12/30/2012  . Morbid obesity ([place]) 12/30/2012  . OSA (obstructive sleep apnea)- on C-pap 12/30/2012  . Bilateral hearing loss 12/30/2012  . Arthritis   . Glaucoma    . Diabetes mellitus with neuropathy ([place])   . Hypertension   . ESRD (end stage renal disease) on dialysis ([place])   . Stroke ([place])   . Hyperlipidemia    PCP:  [name], MD Pharmacy:   [facility] [address] ([address] [place] [address] [place] ([place]) [place] [address] Phone: [phone] Fax: [phone]     Social Determinants of Health (SDOH) Interventions    Readmission Risk Interventions No flowsheet data found.

## 2019-02-17 ENCOUNTER — Telehealth: Payer: Self-pay | Admitting: Nurse Practitioner

## 2019-02-17 NOTE — Telephone Encounter (Signed)
Transition of care contact from inpatient facility Date of Discharge: 02/16/2019 Method of Contact: Phone Who did I talk to? Reece Agar Patient contacted to discuss transition of care from recent inpatient hospitalization. Patient was admitted to [facility] from 02/15/2019 to 02/16/2019 with discharge diagnosis of hyperkalemia.  Medication changes were reviewed Patient plans to follow up with his outpatient HD center on 02/18/2019 Other follow up needed includes: Visit with PCP Dr. Lurlean Nanny is calling today to schedule an appointment.

## 2019-02-19 DIAGNOSIS — N2581 Secondary hyperparathyroidism of renal origin: Secondary | ICD-10-CM | POA: Diagnosis not present

## 2019-02-19 DIAGNOSIS — N186 End stage renal disease: Secondary | ICD-10-CM | POA: Diagnosis not present

## 2019-02-19 DIAGNOSIS — E875 Hyperkalemia: Secondary | ICD-10-CM | POA: Diagnosis not present

## 2019-02-19 DIAGNOSIS — Z992 Dependence on renal dialysis: Secondary | ICD-10-CM | POA: Diagnosis not present

## 2019-02-19 DIAGNOSIS — R000 Tachycardia, unspecified: Secondary | ICD-10-CM | POA: Diagnosis not present

## 2019-02-19 DIAGNOSIS — E8770 Fluid overload, unspecified: Secondary | ICD-10-CM | POA: Diagnosis not present

## 2019-02-20 DIAGNOSIS — N2581 Secondary hyperparathyroidism of renal origin: Secondary | ICD-10-CM | POA: Diagnosis not present

## 2019-02-20 DIAGNOSIS — N186 End stage renal disease: Secondary | ICD-10-CM | POA: Diagnosis not present

## 2019-02-20 DIAGNOSIS — Z992 Dependence on renal dialysis: Secondary | ICD-10-CM | POA: Diagnosis not present

## 2019-02-23 DIAGNOSIS — N2581 Secondary hyperparathyroidism of renal origin: Secondary | ICD-10-CM | POA: Diagnosis not present

## 2019-02-23 DIAGNOSIS — Z992 Dependence on renal dialysis: Secondary | ICD-10-CM | POA: Diagnosis not present

## 2019-02-23 DIAGNOSIS — N186 End stage renal disease: Secondary | ICD-10-CM | POA: Diagnosis not present

## 2019-02-24 DIAGNOSIS — M109 Gout, unspecified: Secondary | ICD-10-CM | POA: Diagnosis not present

## 2019-02-24 DIAGNOSIS — I5020 Unspecified systolic (congestive) heart failure: Secondary | ICD-10-CM | POA: Diagnosis not present

## 2019-02-24 DIAGNOSIS — I4891 Unspecified atrial fibrillation: Secondary | ICD-10-CM | POA: Diagnosis not present

## 2019-02-24 DIAGNOSIS — I10 Essential (primary) hypertension: Secondary | ICD-10-CM | POA: Diagnosis not present

## 2019-02-24 DIAGNOSIS — E1143 Type 2 diabetes mellitus with diabetic autonomic (poly)neuropathy: Secondary | ICD-10-CM | POA: Diagnosis not present

## 2019-02-26 DIAGNOSIS — N2581 Secondary hyperparathyroidism of renal origin: Secondary | ICD-10-CM | POA: Diagnosis not present

## 2019-02-26 DIAGNOSIS — N186 End stage renal disease: Secondary | ICD-10-CM | POA: Diagnosis not present

## 2019-02-26 DIAGNOSIS — Z992 Dependence on renal dialysis: Secondary | ICD-10-CM | POA: Diagnosis not present

## 2019-03-02 DIAGNOSIS — N2581 Secondary hyperparathyroidism of renal origin: Secondary | ICD-10-CM | POA: Diagnosis not present

## 2019-03-02 DIAGNOSIS — Z992 Dependence on renal dialysis: Secondary | ICD-10-CM | POA: Diagnosis not present

## 2019-03-02 DIAGNOSIS — N186 End stage renal disease: Secondary | ICD-10-CM | POA: Diagnosis not present

## 2019-03-04 DIAGNOSIS — N186 End stage renal disease: Secondary | ICD-10-CM | POA: Diagnosis not present

## 2019-03-04 DIAGNOSIS — N2581 Secondary hyperparathyroidism of renal origin: Secondary | ICD-10-CM | POA: Diagnosis not present

## 2019-03-04 DIAGNOSIS — Z992 Dependence on renal dialysis: Secondary | ICD-10-CM | POA: Diagnosis not present

## 2019-03-06 DIAGNOSIS — N2581 Secondary hyperparathyroidism of renal origin: Secondary | ICD-10-CM | POA: Diagnosis not present

## 2019-03-06 DIAGNOSIS — Z992 Dependence on renal dialysis: Secondary | ICD-10-CM | POA: Diagnosis not present

## 2019-03-06 DIAGNOSIS — N186 End stage renal disease: Secondary | ICD-10-CM | POA: Diagnosis not present

## 2019-03-09 DIAGNOSIS — N186 End stage renal disease: Secondary | ICD-10-CM | POA: Diagnosis not present

## 2019-03-09 DIAGNOSIS — Z992 Dependence on renal dialysis: Secondary | ICD-10-CM | POA: Diagnosis not present

## 2019-03-09 DIAGNOSIS — N2581 Secondary hyperparathyroidism of renal origin: Secondary | ICD-10-CM | POA: Diagnosis not present

## 2019-03-15 DIAGNOSIS — N186 End stage renal disease: Secondary | ICD-10-CM | POA: Diagnosis not present

## 2019-03-15 DIAGNOSIS — I120 Hypertensive chronic kidney disease with stage 5 chronic kidney disease or end stage renal disease: Secondary | ICD-10-CM | POA: Diagnosis not present

## 2019-03-15 DIAGNOSIS — Z992 Dependence on renal dialysis: Secondary | ICD-10-CM | POA: Diagnosis not present

## 2019-03-16 DIAGNOSIS — Z992 Dependence on renal dialysis: Secondary | ICD-10-CM | POA: Diagnosis not present

## 2019-03-16 DIAGNOSIS — N2581 Secondary hyperparathyroidism of renal origin: Secondary | ICD-10-CM | POA: Diagnosis not present

## 2019-03-16 DIAGNOSIS — N186 End stage renal disease: Secondary | ICD-10-CM | POA: Diagnosis not present

## 2019-03-20 DIAGNOSIS — Z992 Dependence on renal dialysis: Secondary | ICD-10-CM | POA: Diagnosis not present

## 2019-03-20 DIAGNOSIS — N186 End stage renal disease: Secondary | ICD-10-CM | POA: Diagnosis not present

## 2019-03-20 DIAGNOSIS — N2581 Secondary hyperparathyroidism of renal origin: Secondary | ICD-10-CM | POA: Diagnosis not present

## 2019-03-24 DIAGNOSIS — N2581 Secondary hyperparathyroidism of renal origin: Secondary | ICD-10-CM | POA: Diagnosis not present

## 2019-03-24 DIAGNOSIS — Z992 Dependence on renal dialysis: Secondary | ICD-10-CM | POA: Diagnosis not present

## 2019-03-24 DIAGNOSIS — N186 End stage renal disease: Secondary | ICD-10-CM | POA: Diagnosis not present

## 2019-03-25 DIAGNOSIS — N186 End stage renal disease: Secondary | ICD-10-CM | POA: Diagnosis not present

## 2019-03-25 DIAGNOSIS — N2581 Secondary hyperparathyroidism of renal origin: Secondary | ICD-10-CM | POA: Diagnosis not present

## 2019-03-25 DIAGNOSIS — Z992 Dependence on renal dialysis: Secondary | ICD-10-CM | POA: Diagnosis not present

## 2019-03-27 DIAGNOSIS — Z992 Dependence on renal dialysis: Secondary | ICD-10-CM | POA: Diagnosis not present

## 2019-03-27 DIAGNOSIS — N2581 Secondary hyperparathyroidism of renal origin: Secondary | ICD-10-CM | POA: Diagnosis not present

## 2019-03-27 DIAGNOSIS — N186 End stage renal disease: Secondary | ICD-10-CM | POA: Diagnosis not present

## 2019-03-30 DIAGNOSIS — N2581 Secondary hyperparathyroidism of renal origin: Secondary | ICD-10-CM | POA: Diagnosis not present

## 2019-03-30 DIAGNOSIS — Z992 Dependence on renal dialysis: Secondary | ICD-10-CM | POA: Diagnosis not present

## 2019-03-30 DIAGNOSIS — N186 End stage renal disease: Secondary | ICD-10-CM | POA: Diagnosis not present

## 2019-04-01 DIAGNOSIS — Z992 Dependence on renal dialysis: Secondary | ICD-10-CM | POA: Diagnosis not present

## 2019-04-01 DIAGNOSIS — N186 End stage renal disease: Secondary | ICD-10-CM | POA: Diagnosis not present

## 2019-04-01 DIAGNOSIS — N2581 Secondary hyperparathyroidism of renal origin: Secondary | ICD-10-CM | POA: Diagnosis not present

## 2019-04-03 DIAGNOSIS — Z992 Dependence on renal dialysis: Secondary | ICD-10-CM | POA: Diagnosis not present

## 2019-04-03 DIAGNOSIS — N2581 Secondary hyperparathyroidism of renal origin: Secondary | ICD-10-CM | POA: Diagnosis not present

## 2019-04-03 DIAGNOSIS — N186 End stage renal disease: Secondary | ICD-10-CM | POA: Diagnosis not present

## 2019-04-08 DIAGNOSIS — Z992 Dependence on renal dialysis: Secondary | ICD-10-CM | POA: Diagnosis not present

## 2019-04-08 DIAGNOSIS — N186 End stage renal disease: Secondary | ICD-10-CM | POA: Diagnosis not present

## 2019-04-08 DIAGNOSIS — N2581 Secondary hyperparathyroidism of renal origin: Secondary | ICD-10-CM | POA: Diagnosis not present

## 2019-04-10 DIAGNOSIS — N186 End stage renal disease: Secondary | ICD-10-CM | POA: Diagnosis not present

## 2019-04-10 DIAGNOSIS — Z992 Dependence on renal dialysis: Secondary | ICD-10-CM | POA: Diagnosis not present

## 2019-04-10 DIAGNOSIS — N2581 Secondary hyperparathyroidism of renal origin: Secondary | ICD-10-CM | POA: Diagnosis not present

## 2019-04-12 DIAGNOSIS — I120 Hypertensive chronic kidney disease with stage 5 chronic kidney disease or end stage renal disease: Secondary | ICD-10-CM | POA: Diagnosis not present

## 2019-04-12 DIAGNOSIS — Z992 Dependence on renal dialysis: Secondary | ICD-10-CM | POA: Diagnosis not present

## 2019-04-12 DIAGNOSIS — N186 End stage renal disease: Secondary | ICD-10-CM | POA: Diagnosis not present

## 2019-04-13 DIAGNOSIS — Z992 Dependence on renal dialysis: Secondary | ICD-10-CM | POA: Diagnosis not present

## 2019-04-13 DIAGNOSIS — N186 End stage renal disease: Secondary | ICD-10-CM | POA: Diagnosis not present

## 2019-04-13 DIAGNOSIS — N2581 Secondary hyperparathyroidism of renal origin: Secondary | ICD-10-CM | POA: Diagnosis not present

## 2019-04-15 DIAGNOSIS — N2581 Secondary hyperparathyroidism of renal origin: Secondary | ICD-10-CM | POA: Diagnosis not present

## 2019-04-15 DIAGNOSIS — N186 End stage renal disease: Secondary | ICD-10-CM | POA: Diagnosis not present

## 2019-04-15 DIAGNOSIS — Z992 Dependence on renal dialysis: Secondary | ICD-10-CM | POA: Diagnosis not present

## 2019-04-17 DIAGNOSIS — N186 End stage renal disease: Secondary | ICD-10-CM | POA: Diagnosis not present

## 2019-04-17 DIAGNOSIS — N2581 Secondary hyperparathyroidism of renal origin: Secondary | ICD-10-CM | POA: Diagnosis not present

## 2019-04-17 DIAGNOSIS — Z992 Dependence on renal dialysis: Secondary | ICD-10-CM | POA: Diagnosis not present

## 2019-04-21 DIAGNOSIS — N186 End stage renal disease: Secondary | ICD-10-CM | POA: Diagnosis not present

## 2019-04-21 DIAGNOSIS — Z992 Dependence on renal dialysis: Secondary | ICD-10-CM | POA: Diagnosis not present

## 2019-04-21 DIAGNOSIS — N2581 Secondary hyperparathyroidism of renal origin: Secondary | ICD-10-CM | POA: Diagnosis not present

## 2019-04-22 DIAGNOSIS — Z992 Dependence on renal dialysis: Secondary | ICD-10-CM | POA: Diagnosis not present

## 2019-04-22 DIAGNOSIS — N186 End stage renal disease: Secondary | ICD-10-CM | POA: Diagnosis not present

## 2019-04-22 DIAGNOSIS — N2581 Secondary hyperparathyroidism of renal origin: Secondary | ICD-10-CM | POA: Diagnosis not present

## 2019-04-27 DIAGNOSIS — N2581 Secondary hyperparathyroidism of renal origin: Secondary | ICD-10-CM | POA: Diagnosis not present

## 2019-04-27 DIAGNOSIS — Z992 Dependence on renal dialysis: Secondary | ICD-10-CM | POA: Diagnosis not present

## 2019-04-27 DIAGNOSIS — N186 End stage renal disease: Secondary | ICD-10-CM | POA: Diagnosis not present

## 2019-04-29 DIAGNOSIS — Z992 Dependence on renal dialysis: Secondary | ICD-10-CM | POA: Diagnosis not present

## 2019-04-29 DIAGNOSIS — N186 End stage renal disease: Secondary | ICD-10-CM | POA: Diagnosis not present

## 2019-04-29 DIAGNOSIS — N2581 Secondary hyperparathyroidism of renal origin: Secondary | ICD-10-CM | POA: Diagnosis not present

## 2019-05-01 DIAGNOSIS — Z992 Dependence on renal dialysis: Secondary | ICD-10-CM | POA: Diagnosis not present

## 2019-05-01 DIAGNOSIS — N2581 Secondary hyperparathyroidism of renal origin: Secondary | ICD-10-CM | POA: Diagnosis not present

## 2019-05-01 DIAGNOSIS — N186 End stage renal disease: Secondary | ICD-10-CM | POA: Diagnosis not present

## 2019-05-04 DIAGNOSIS — Z992 Dependence on renal dialysis: Secondary | ICD-10-CM | POA: Diagnosis not present

## 2019-05-04 DIAGNOSIS — N2581 Secondary hyperparathyroidism of renal origin: Secondary | ICD-10-CM | POA: Diagnosis not present

## 2019-05-04 DIAGNOSIS — N186 End stage renal disease: Secondary | ICD-10-CM | POA: Diagnosis not present

## 2019-05-06 DIAGNOSIS — N186 End stage renal disease: Secondary | ICD-10-CM | POA: Diagnosis not present

## 2019-05-06 DIAGNOSIS — N2581 Secondary hyperparathyroidism of renal origin: Secondary | ICD-10-CM | POA: Diagnosis not present

## 2019-05-06 DIAGNOSIS — Z992 Dependence on renal dialysis: Secondary | ICD-10-CM | POA: Diagnosis not present

## 2019-05-08 DIAGNOSIS — N2581 Secondary hyperparathyroidism of renal origin: Secondary | ICD-10-CM | POA: Diagnosis not present

## 2019-05-08 DIAGNOSIS — Z992 Dependence on renal dialysis: Secondary | ICD-10-CM | POA: Diagnosis not present

## 2019-05-08 DIAGNOSIS — N186 End stage renal disease: Secondary | ICD-10-CM | POA: Diagnosis not present

## 2019-05-11 DIAGNOSIS — N2581 Secondary hyperparathyroidism of renal origin: Secondary | ICD-10-CM | POA: Diagnosis not present

## 2019-05-11 DIAGNOSIS — Z992 Dependence on renal dialysis: Secondary | ICD-10-CM | POA: Diagnosis not present

## 2019-05-11 DIAGNOSIS — N186 End stage renal disease: Secondary | ICD-10-CM | POA: Diagnosis not present

## 2019-05-13 DIAGNOSIS — I120 Hypertensive chronic kidney disease with stage 5 chronic kidney disease or end stage renal disease: Secondary | ICD-10-CM | POA: Diagnosis not present

## 2019-05-13 DIAGNOSIS — N186 End stage renal disease: Secondary | ICD-10-CM | POA: Diagnosis not present

## 2019-05-13 DIAGNOSIS — Z992 Dependence on renal dialysis: Secondary | ICD-10-CM | POA: Diagnosis not present

## 2019-05-14 DIAGNOSIS — T82858A Stenosis of vascular prosthetic devices, implants and grafts, initial encounter: Secondary | ICD-10-CM | POA: Diagnosis not present

## 2019-05-14 DIAGNOSIS — N186 End stage renal disease: Secondary | ICD-10-CM | POA: Diagnosis not present

## 2019-05-14 DIAGNOSIS — I871 Compression of vein: Secondary | ICD-10-CM | POA: Diagnosis not present

## 2019-05-14 DIAGNOSIS — Z992 Dependence on renal dialysis: Secondary | ICD-10-CM | POA: Diagnosis not present

## 2019-05-15 DIAGNOSIS — N186 End stage renal disease: Secondary | ICD-10-CM | POA: Diagnosis not present

## 2019-05-15 DIAGNOSIS — Z992 Dependence on renal dialysis: Secondary | ICD-10-CM | POA: Diagnosis not present

## 2019-05-15 DIAGNOSIS — N2581 Secondary hyperparathyroidism of renal origin: Secondary | ICD-10-CM | POA: Diagnosis not present

## 2019-05-18 DIAGNOSIS — N186 End stage renal disease: Secondary | ICD-10-CM | POA: Diagnosis not present

## 2019-05-18 DIAGNOSIS — Z992 Dependence on renal dialysis: Secondary | ICD-10-CM | POA: Diagnosis not present

## 2019-05-18 DIAGNOSIS — N2581 Secondary hyperparathyroidism of renal origin: Secondary | ICD-10-CM | POA: Diagnosis not present

## 2019-05-20 DIAGNOSIS — N186 End stage renal disease: Secondary | ICD-10-CM | POA: Diagnosis not present

## 2019-05-20 DIAGNOSIS — N2581 Secondary hyperparathyroidism of renal origin: Secondary | ICD-10-CM | POA: Diagnosis not present

## 2019-05-20 DIAGNOSIS — Z992 Dependence on renal dialysis: Secondary | ICD-10-CM | POA: Diagnosis not present

## 2019-05-22 DIAGNOSIS — Z992 Dependence on renal dialysis: Secondary | ICD-10-CM | POA: Diagnosis not present

## 2019-05-22 DIAGNOSIS — N186 End stage renal disease: Secondary | ICD-10-CM | POA: Diagnosis not present

## 2019-05-22 DIAGNOSIS — N2581 Secondary hyperparathyroidism of renal origin: Secondary | ICD-10-CM | POA: Diagnosis not present

## 2019-05-25 ENCOUNTER — Encounter: Payer: Medicare HMO | Admitting: Physician Assistant

## 2019-05-25 DIAGNOSIS — N186 End stage renal disease: Secondary | ICD-10-CM | POA: Diagnosis not present

## 2019-05-25 DIAGNOSIS — Z992 Dependence on renal dialysis: Secondary | ICD-10-CM | POA: Diagnosis not present

## 2019-05-25 DIAGNOSIS — N2581 Secondary hyperparathyroidism of renal origin: Secondary | ICD-10-CM | POA: Diagnosis not present

## 2019-05-25 NOTE — Progress Notes (Signed)
This encounter was created in error - please disregard.

## 2019-05-27 DIAGNOSIS — N186 End stage renal disease: Secondary | ICD-10-CM | POA: Diagnosis not present

## 2019-05-27 DIAGNOSIS — Z992 Dependence on renal dialysis: Secondary | ICD-10-CM | POA: Diagnosis not present

## 2019-05-27 DIAGNOSIS — N2581 Secondary hyperparathyroidism of renal origin: Secondary | ICD-10-CM | POA: Diagnosis not present

## 2019-05-29 DIAGNOSIS — Z992 Dependence on renal dialysis: Secondary | ICD-10-CM | POA: Diagnosis not present

## 2019-05-29 DIAGNOSIS — N186 End stage renal disease: Secondary | ICD-10-CM | POA: Diagnosis not present

## 2019-05-29 DIAGNOSIS — N2581 Secondary hyperparathyroidism of renal origin: Secondary | ICD-10-CM | POA: Diagnosis not present

## 2019-06-02 ENCOUNTER — Encounter: Payer: Medicare HMO | Admitting: Physician Assistant

## 2019-06-02 ENCOUNTER — Telehealth: Payer: Self-pay

## 2019-06-02 DIAGNOSIS — N186 End stage renal disease: Secondary | ICD-10-CM | POA: Diagnosis not present

## 2019-06-02 DIAGNOSIS — Z992 Dependence on renal dialysis: Secondary | ICD-10-CM | POA: Diagnosis not present

## 2019-06-02 DIAGNOSIS — N2581 Secondary hyperparathyroidism of renal origin: Secondary | ICD-10-CM | POA: Diagnosis not present

## 2019-06-02 NOTE — Telephone Encounter (Signed)
Called and left a voice message for the patient informing him that I was calling because he is scheduled for a virtual telephone visit with Almyra Deforest, PA-C at 10:45AM and that I was calling to go over medications and vitals signs. I stated that I will be giving him a call back in 5 minutes or he can give our office a call.

## 2019-06-03 DIAGNOSIS — N2581 Secondary hyperparathyroidism of renal origin: Secondary | ICD-10-CM | POA: Diagnosis not present

## 2019-06-03 DIAGNOSIS — N186 End stage renal disease: Secondary | ICD-10-CM | POA: Diagnosis not present

## 2019-06-03 DIAGNOSIS — Z992 Dependence on renal dialysis: Secondary | ICD-10-CM | POA: Diagnosis not present

## 2019-06-04 NOTE — Progress Notes (Signed)
This encounter was created in error - please disregard.

## 2019-06-05 DIAGNOSIS — N2581 Secondary hyperparathyroidism of renal origin: Secondary | ICD-10-CM | POA: Diagnosis not present

## 2019-06-05 DIAGNOSIS — N186 End stage renal disease: Secondary | ICD-10-CM | POA: Diagnosis not present

## 2019-06-05 DIAGNOSIS — Z992 Dependence on renal dialysis: Secondary | ICD-10-CM | POA: Diagnosis not present

## 2019-06-08 DIAGNOSIS — N186 End stage renal disease: Secondary | ICD-10-CM | POA: Diagnosis not present

## 2019-06-08 DIAGNOSIS — Z992 Dependence on renal dialysis: Secondary | ICD-10-CM | POA: Diagnosis not present

## 2019-06-08 DIAGNOSIS — N2581 Secondary hyperparathyroidism of renal origin: Secondary | ICD-10-CM | POA: Diagnosis not present

## 2019-06-11 DIAGNOSIS — Z992 Dependence on renal dialysis: Secondary | ICD-10-CM | POA: Diagnosis not present

## 2019-06-11 DIAGNOSIS — N186 End stage renal disease: Secondary | ICD-10-CM | POA: Diagnosis not present

## 2019-06-11 DIAGNOSIS — N2581 Secondary hyperparathyroidism of renal origin: Secondary | ICD-10-CM | POA: Diagnosis not present

## 2019-06-12 DIAGNOSIS — Z992 Dependence on renal dialysis: Secondary | ICD-10-CM | POA: Diagnosis not present

## 2019-06-12 DIAGNOSIS — I120 Hypertensive chronic kidney disease with stage 5 chronic kidney disease or end stage renal disease: Secondary | ICD-10-CM | POA: Diagnosis not present

## 2019-06-12 DIAGNOSIS — N2581 Secondary hyperparathyroidism of renal origin: Secondary | ICD-10-CM | POA: Diagnosis not present

## 2019-06-12 DIAGNOSIS — N186 End stage renal disease: Secondary | ICD-10-CM | POA: Diagnosis not present

## 2019-06-15 DIAGNOSIS — N2581 Secondary hyperparathyroidism of renal origin: Secondary | ICD-10-CM | POA: Diagnosis not present

## 2019-06-15 DIAGNOSIS — Z992 Dependence on renal dialysis: Secondary | ICD-10-CM | POA: Diagnosis not present

## 2019-06-15 DIAGNOSIS — N186 End stage renal disease: Secondary | ICD-10-CM | POA: Diagnosis not present

## 2019-06-17 DIAGNOSIS — Z992 Dependence on renal dialysis: Secondary | ICD-10-CM | POA: Diagnosis not present

## 2019-06-17 DIAGNOSIS — N186 End stage renal disease: Secondary | ICD-10-CM | POA: Diagnosis not present

## 2019-06-17 DIAGNOSIS — N2581 Secondary hyperparathyroidism of renal origin: Secondary | ICD-10-CM | POA: Diagnosis not present

## 2019-06-19 DIAGNOSIS — N2581 Secondary hyperparathyroidism of renal origin: Secondary | ICD-10-CM | POA: Diagnosis not present

## 2019-06-19 DIAGNOSIS — Z992 Dependence on renal dialysis: Secondary | ICD-10-CM | POA: Diagnosis not present

## 2019-06-19 DIAGNOSIS — N186 End stage renal disease: Secondary | ICD-10-CM | POA: Diagnosis not present

## 2019-06-22 DIAGNOSIS — N2581 Secondary hyperparathyroidism of renal origin: Secondary | ICD-10-CM | POA: Diagnosis not present

## 2019-06-22 DIAGNOSIS — Z992 Dependence on renal dialysis: Secondary | ICD-10-CM | POA: Diagnosis not present

## 2019-06-22 DIAGNOSIS — N186 End stage renal disease: Secondary | ICD-10-CM | POA: Diagnosis not present

## 2019-06-24 DIAGNOSIS — N2581 Secondary hyperparathyroidism of renal origin: Secondary | ICD-10-CM | POA: Diagnosis not present

## 2019-06-24 DIAGNOSIS — Z992 Dependence on renal dialysis: Secondary | ICD-10-CM | POA: Diagnosis not present

## 2019-06-24 DIAGNOSIS — N186 End stage renal disease: Secondary | ICD-10-CM | POA: Diagnosis not present

## 2019-06-26 DIAGNOSIS — N2581 Secondary hyperparathyroidism of renal origin: Secondary | ICD-10-CM | POA: Diagnosis not present

## 2019-06-26 DIAGNOSIS — N186 End stage renal disease: Secondary | ICD-10-CM | POA: Diagnosis not present

## 2019-06-26 DIAGNOSIS — Z992 Dependence on renal dialysis: Secondary | ICD-10-CM | POA: Diagnosis not present

## 2019-06-30 DIAGNOSIS — N186 End stage renal disease: Secondary | ICD-10-CM | POA: Diagnosis not present

## 2019-06-30 DIAGNOSIS — Z992 Dependence on renal dialysis: Secondary | ICD-10-CM | POA: Diagnosis not present

## 2019-06-30 DIAGNOSIS — N2581 Secondary hyperparathyroidism of renal origin: Secondary | ICD-10-CM | POA: Diagnosis not present

## 2019-07-01 DIAGNOSIS — Z992 Dependence on renal dialysis: Secondary | ICD-10-CM | POA: Diagnosis not present

## 2019-07-01 DIAGNOSIS — N186 End stage renal disease: Secondary | ICD-10-CM | POA: Diagnosis not present

## 2019-07-01 DIAGNOSIS — N2581 Secondary hyperparathyroidism of renal origin: Secondary | ICD-10-CM | POA: Diagnosis not present

## 2019-07-03 DIAGNOSIS — N186 End stage renal disease: Secondary | ICD-10-CM | POA: Diagnosis not present

## 2019-07-03 DIAGNOSIS — N2581 Secondary hyperparathyroidism of renal origin: Secondary | ICD-10-CM | POA: Diagnosis not present

## 2019-07-03 DIAGNOSIS — Z992 Dependence on renal dialysis: Secondary | ICD-10-CM | POA: Diagnosis not present

## 2019-07-13 DIAGNOSIS — I120 Hypertensive chronic kidney disease with stage 5 chronic kidney disease or end stage renal disease: Secondary | ICD-10-CM | POA: Diagnosis not present

## 2019-07-13 DIAGNOSIS — Z992 Dependence on renal dialysis: Secondary | ICD-10-CM | POA: Diagnosis not present

## 2019-07-13 DIAGNOSIS — N186 End stage renal disease: Secondary | ICD-10-CM | POA: Diagnosis not present

## 2019-07-13 DIAGNOSIS — N2581 Secondary hyperparathyroidism of renal origin: Secondary | ICD-10-CM | POA: Diagnosis not present

## 2019-07-15 DIAGNOSIS — N2581 Secondary hyperparathyroidism of renal origin: Secondary | ICD-10-CM | POA: Diagnosis not present

## 2019-07-15 DIAGNOSIS — Z992 Dependence on renal dialysis: Secondary | ICD-10-CM | POA: Diagnosis not present

## 2019-07-15 DIAGNOSIS — N186 End stage renal disease: Secondary | ICD-10-CM | POA: Diagnosis not present

## 2019-07-17 DIAGNOSIS — N186 End stage renal disease: Secondary | ICD-10-CM | POA: Diagnosis not present

## 2019-07-17 DIAGNOSIS — Z992 Dependence on renal dialysis: Secondary | ICD-10-CM | POA: Diagnosis not present

## 2019-07-17 DIAGNOSIS — N2581 Secondary hyperparathyroidism of renal origin: Secondary | ICD-10-CM | POA: Diagnosis not present

## 2019-07-21 DIAGNOSIS — N2581 Secondary hyperparathyroidism of renal origin: Secondary | ICD-10-CM | POA: Diagnosis not present

## 2019-07-21 DIAGNOSIS — Z992 Dependence on renal dialysis: Secondary | ICD-10-CM | POA: Diagnosis not present

## 2019-07-21 DIAGNOSIS — N186 End stage renal disease: Secondary | ICD-10-CM | POA: Diagnosis not present

## 2019-07-22 DIAGNOSIS — Z992 Dependence on renal dialysis: Secondary | ICD-10-CM | POA: Diagnosis not present

## 2019-07-22 DIAGNOSIS — N186 End stage renal disease: Secondary | ICD-10-CM | POA: Diagnosis not present

## 2019-07-22 DIAGNOSIS — N2581 Secondary hyperparathyroidism of renal origin: Secondary | ICD-10-CM | POA: Diagnosis not present

## 2019-07-24 DIAGNOSIS — Z992 Dependence on renal dialysis: Secondary | ICD-10-CM | POA: Diagnosis not present

## 2019-07-24 DIAGNOSIS — N186 End stage renal disease: Secondary | ICD-10-CM | POA: Diagnosis not present

## 2019-07-24 DIAGNOSIS — N2581 Secondary hyperparathyroidism of renal origin: Secondary | ICD-10-CM | POA: Diagnosis not present

## 2019-07-27 DIAGNOSIS — Z992 Dependence on renal dialysis: Secondary | ICD-10-CM | POA: Diagnosis not present

## 2019-07-27 DIAGNOSIS — N186 End stage renal disease: Secondary | ICD-10-CM | POA: Diagnosis not present

## 2019-07-27 DIAGNOSIS — N2581 Secondary hyperparathyroidism of renal origin: Secondary | ICD-10-CM | POA: Diagnosis not present

## 2019-07-29 DIAGNOSIS — N186 End stage renal disease: Secondary | ICD-10-CM | POA: Diagnosis not present

## 2019-07-29 DIAGNOSIS — N2581 Secondary hyperparathyroidism of renal origin: Secondary | ICD-10-CM | POA: Diagnosis not present

## 2019-07-29 DIAGNOSIS — Z992 Dependence on renal dialysis: Secondary | ICD-10-CM | POA: Diagnosis not present

## 2019-07-31 DIAGNOSIS — N186 End stage renal disease: Secondary | ICD-10-CM | POA: Diagnosis not present

## 2019-07-31 DIAGNOSIS — Z992 Dependence on renal dialysis: Secondary | ICD-10-CM | POA: Diagnosis not present

## 2019-07-31 DIAGNOSIS — N2581 Secondary hyperparathyroidism of renal origin: Secondary | ICD-10-CM | POA: Diagnosis not present

## 2019-08-04 DIAGNOSIS — N2581 Secondary hyperparathyroidism of renal origin: Secondary | ICD-10-CM | POA: Diagnosis not present

## 2019-08-04 DIAGNOSIS — N186 End stage renal disease: Secondary | ICD-10-CM | POA: Diagnosis not present

## 2019-08-04 DIAGNOSIS — Z992 Dependence on renal dialysis: Secondary | ICD-10-CM | POA: Diagnosis not present

## 2019-08-05 DIAGNOSIS — N2581 Secondary hyperparathyroidism of renal origin: Secondary | ICD-10-CM | POA: Diagnosis not present

## 2019-08-05 DIAGNOSIS — Z992 Dependence on renal dialysis: Secondary | ICD-10-CM | POA: Diagnosis not present

## 2019-08-05 DIAGNOSIS — N186 End stage renal disease: Secondary | ICD-10-CM | POA: Diagnosis not present

## 2019-08-07 DIAGNOSIS — N186 End stage renal disease: Secondary | ICD-10-CM | POA: Diagnosis not present

## 2019-08-07 DIAGNOSIS — N2581 Secondary hyperparathyroidism of renal origin: Secondary | ICD-10-CM | POA: Diagnosis not present

## 2019-08-07 DIAGNOSIS — Z992 Dependence on renal dialysis: Secondary | ICD-10-CM | POA: Diagnosis not present

## 2019-08-10 DIAGNOSIS — N2581 Secondary hyperparathyroidism of renal origin: Secondary | ICD-10-CM | POA: Diagnosis not present

## 2019-08-10 DIAGNOSIS — Z992 Dependence on renal dialysis: Secondary | ICD-10-CM | POA: Diagnosis not present

## 2019-08-10 DIAGNOSIS — N186 End stage renal disease: Secondary | ICD-10-CM | POA: Diagnosis not present

## 2019-08-12 DIAGNOSIS — I120 Hypertensive chronic kidney disease with stage 5 chronic kidney disease or end stage renal disease: Secondary | ICD-10-CM | POA: Diagnosis not present

## 2019-08-12 DIAGNOSIS — N2581 Secondary hyperparathyroidism of renal origin: Secondary | ICD-10-CM | POA: Diagnosis not present

## 2019-08-12 DIAGNOSIS — Z992 Dependence on renal dialysis: Secondary | ICD-10-CM | POA: Diagnosis not present

## 2019-08-12 DIAGNOSIS — N186 End stage renal disease: Secondary | ICD-10-CM | POA: Diagnosis not present

## 2019-08-22 DIAGNOSIS — N186 End stage renal disease: Secondary | ICD-10-CM | POA: Diagnosis not present

## 2019-08-22 DIAGNOSIS — N2581 Secondary hyperparathyroidism of renal origin: Secondary | ICD-10-CM | POA: Diagnosis not present

## 2019-08-22 DIAGNOSIS — Z992 Dependence on renal dialysis: Secondary | ICD-10-CM | POA: Diagnosis not present

## 2019-08-24 DIAGNOSIS — N186 End stage renal disease: Secondary | ICD-10-CM | POA: Diagnosis not present

## 2019-08-24 DIAGNOSIS — N2581 Secondary hyperparathyroidism of renal origin: Secondary | ICD-10-CM | POA: Diagnosis not present

## 2019-08-24 DIAGNOSIS — Z992 Dependence on renal dialysis: Secondary | ICD-10-CM | POA: Diagnosis not present

## 2019-08-25 ENCOUNTER — Other Ambulatory Visit: Payer: Self-pay

## 2019-08-26 DIAGNOSIS — N186 End stage renal disease: Secondary | ICD-10-CM | POA: Diagnosis not present

## 2019-08-26 DIAGNOSIS — N2581 Secondary hyperparathyroidism of renal origin: Secondary | ICD-10-CM | POA: Diagnosis not present

## 2019-08-26 DIAGNOSIS — Z992 Dependence on renal dialysis: Secondary | ICD-10-CM | POA: Diagnosis not present

## 2019-08-27 ENCOUNTER — Other Ambulatory Visit: Payer: Self-pay

## 2019-08-27 NOTE — Patient Outreach (Signed)
[name] Care Management  08/27/2019  Daniel Whitney 09-Oct-1955 [id number]   Telephone Screen  Referral Date: 7/13/20201 Referral Source: EMMI Prevent Referral Reason: "patient engagement score 8, ESRD, HTN,DM" Insurance: [employer]   Outreach attempt # 1 to patient. No answer. RN CM left HIPAA compliant voicemail message along with contact info.     Plan: RN CM will make outreach attempt to patient within 3-4 business days. RN CM will send unsuccessful outreach letter to patient.   Enzo Montgomery, RN,BSN,CCM [place] Management Telephonic Care Management Coordinator Direct Phone: [phone] Toll Free: [phone] Fax: [phone]

## 2019-08-28 DIAGNOSIS — N2581 Secondary hyperparathyroidism of renal origin: Secondary | ICD-10-CM | POA: Diagnosis not present

## 2019-08-28 DIAGNOSIS — Z992 Dependence on renal dialysis: Secondary | ICD-10-CM | POA: Diagnosis not present

## 2019-08-28 DIAGNOSIS — N186 End stage renal disease: Secondary | ICD-10-CM | POA: Diagnosis not present

## 2019-08-31 ENCOUNTER — Other Ambulatory Visit: Payer: Self-pay

## 2019-08-31 DIAGNOSIS — N2581 Secondary hyperparathyroidism of renal origin: Secondary | ICD-10-CM | POA: Diagnosis not present

## 2019-08-31 DIAGNOSIS — Z992 Dependence on renal dialysis: Secondary | ICD-10-CM | POA: Diagnosis not present

## 2019-08-31 DIAGNOSIS — N186 End stage renal disease: Secondary | ICD-10-CM | POA: Diagnosis not present

## 2019-08-31 NOTE — Patient Outreach (Addendum)
Daniel Whitney) Care Management  08/31/2019  Daniel Whitney [birth year] [id number]   Telephone Screen  Referral Date: 7/13/20201 Referral Source: EMMI Prevent Referral Reason: "patient engagement score 8, ESRD, HTN,DM" Insurance: [employer]    Outreach attempt #2 to patient. Spoke with patient. He voices that he is doing fairly well. He denies any acute issues or concerns at present. Discussed and reviewed referral source an reason.  Social: Patient resides in his home alone. He reports that he is fairly independent with ADLs/IADLs. He has very helpful[ful neighbor and nephew and nice that assist him as needed. He reports that is it becoming more challenging for him to complete tasks. He was inquiring about in home personal care assistance. RN CM discussed and advised patient services covered under Medicaid and not Medicare. He reports his pension is too high for Medicaid as he has looked into this before. He declined need to speak with SW as he knows income above limits. Patient reports he has a brother that had a stroke and now requires nursing home level of care. His long term goal is to move somewhere where both him and his brother cn both stay and get the care they need. He has family in [place] and has consisted moving there. He report he is just trying to get a plan in place.   Conditions: Per chart review, patient has PMH that includes but not limited too ESRD(M,W,F-second shift), HTN,DM, HF, CVA, OSA, obesity, HLD and hypothyroidism. He reports that he is able to manage his conditions at present. He states DM is controlled with most recent A1C ;level 5.9(Spet 2019). He reports one fall about a month ago coming down the stairs and he missed a step. He is using crutches to help since he had knee surgery.   Medications: Daniel review completed with patient. He reports he fills Daniel planner himself weekly. He denies any issues managing and/or affording meds at this time.     Medications Reviewed Today    Reviewed by Daniel Pedro, RN (Registered Nurse) on 08/31/19 at 1021  Daniel List Status: <None>  Medication Order Taking? Sig Documenting Provider Last Dose Status Informant  allopurinol (ZYLOPRIM) 100 MG tablet [id number] No Take 1 tablet (100 mg total) by mouth 2 (two) times daily. Daniel Whitney, Daniel Whitney, Daniel Whitney 02/13/2019 Active Self  amitriptyline (ELAVIL) 50 MG tablet [id number] No Take 1 tablet (50 mg total) by mouth at bedtime. Daniel Whitney, Daniel Emerald, Daniel Whitney Past Week Unknown time Active Self  b complex-vitamin Whitney-folic acid (NEPHRO-VITE) 0.8 MG TABS tablet [id number] No Take 1 tablet by mouth daily. Provider, Historical, Daniel Whitney Past Week Unknown time Active Self  cinacalcet (SENSIPAR) 30 MG tablet [id number] No Take 30 mg by mouth daily with supper. Provider, Historical, Daniel Whitney Past Week Unknown time Active Self           Daniel Whitney Daniel Whitney   Tue Feb 25, 2018 10:08 AM)    clindamycin (CLEOCIN T) 1 % external solution [id number] No Apply 1 application topically 2 (two) times daily. Apply to face. Provider, Historical, Daniel Whitney 02/13/2019 Unknown time Active Self  Dextromethorphan-Guaifenesin (MUCINEX DM) 30-600 MG TB12 [id number] No Take 1 tablet by mouth daily as needed (congestion).  Provider, Historical, Daniel Whitney Past Week Unknown time Active Self  docusate sodium (COLACE) 100 MG capsule [id number] No Take 100 mg by mouth daily as needed for mild constipation.  Provider, Historical, Daniel Whitney Past Month Unknown time Active Self  gabapentin (NEURONTIN) 100 MG capsule [id number] No Take 2  capsules (200 mg total) by mouth at bedtime. 200 mg qam and 300 mg upon return from dialysis on M-W-F for neuropathy  Patient taking differently: Take 100 mg by mouth 2 (two) times daily.    Daniel Whitney, Daniel Whitney, Daniel Whitney 02/13/2019 Active Self  HYDROcodone-acetaminophen (NORCO/VICODIN) 5-325 MG tablet [id number] No Take 1-2 tablets by mouth every 6 (six) hours as needed. Daniel Johns, Daniel Whitney 02/14/2019 Unknown  time Active Self  lactulose (CHRONULAC) 10 GM/15ML solution [id number] No Take 20 g by mouth 2 (two) times daily as needed for mild constipation. Provider, Historical, Daniel Whitney Past Week Unknown time Active Self  nystatin cream (MYCOSTATIN) [id number] No Apply 1 application topically 2 (two) times daily as needed (rash). Provider, Historical, Daniel Whitney Past Week Unknown time Active Self  simvastatin (ZOCOR) 20 MG tablet [id number] No Take 1 tablet (20 mg total) by mouth daily.  Patient taking differently: Take 20 mg by mouth at bedtime.    Daniel Whitney, Daniel Whitney, Daniel Whitney 02/14/2019 Unknown time Active Self  sodium chloride (OCEAN) 0.65 % SOLN nasal spray [id number] No Place 1 spray into both nostrils as needed for congestion. Daniel Osgood, Daniel Whitney Past Week Unknown time Active Self  tretinoin (RETIN-A) 0.05 % cream [id number] No Apply 1 application topically at bedtime. Apply to face Provider, Historical, Daniel Whitney 02/13/2019 Active Self  Daniel Whitney Daniel Whitney, Daniel Whitney 04/03/17 1418):  Dialysis Monday, Wednesday, Friday [facility]. [place], [place]          Depression screen Daniel Whitney 2/9 08/31/2019 06/09/2018 08/14/2016 06/04/2013  Decreased Interest 0 0 0 0  Down, Depressed, Hopeless 0 0 1 1  PHQ - 2 Score 0 0 1 1  Some recent data might be hidden    Fall Risk  08/31/2019 08/14/2016 03/23/2016 06/04/2013 12/30/2012  Falls in the past year? 1 Yes Yes Yes No  Number falls in past yr: 0 1 1 2  or more -  Injury with Fall? 0 No No - -  Risk Factor Category  - - - (No Data) -  Comment - - - lost balance and fell, walks with cane -  Risk for fall due to : History of fall(s);Impaired balance/gait;Impaired mobility;Impaired vision;Medication side effect Impaired mobility - - -  Follow up Falls evaluation completed;Education provided;Falls prevention discussed Falls prevention discussed - - -    SDOH Screenings   Alcohol Screen:   . Last Alcohol Screening Score (AUDIT):   Depression (PHQ2-9): Low Risk   . PHQ-2 Score: 0   Financial Resource Strain:   . Difficulty of Paying Living Expenses:   Food Insecurity:   . Worried About Charity fundraiser in the Last Year:   . [employer] of Food in the Last Year:   Housing: [place]   . Last Housing Risk Score: 0  Physical Activity:   . Days of Exercise per Week:   . Minutes of Exercise per Session:   Social Connections:   . Frequency of Communication with Friends and Family:   . Frequency of Social Gatherings with Friends and Family:   . Attends Religious Services:   . Active Member of Clubs or Organizations:   . Attends Archivist Meetings:   Marland Kitchen Marital Status:   Stress:   . Feeling of Stress :   Tobacco Use: Low Risk   . Smoking Tobacco Use: Never Smoker  . Smokeless Tobacco Use: Never Used  Transportation Needs: No Transportation Needs  . Lack of Transportation (Medical): No  . Lack of Transportation (Non-Medical): No  Assessment: THN CM Care Plan Problem One     Most Recent Value  Care Plan Problem One Knowledge deficit related to chronic conditions.  Role Documenting the Problem One Care Management Telephonic [place] for Problem One Active  [facility] Long Term Goal  Patient will have no hospitalizations over the next 90 days.  THN Long Term Goal Start Date 08/31/19  Interventions for Problem One Long Term Goal RNCM assessed for any acute sxs/issues, reviewed action plan,. RNCM confirmed pt. seeing MDs regularly.  RNCM assessed for Daniel adherence.  THN CM Short Term Goal #1  Patient will schedule neccesary routine Daniel Whitney appts.  THN CM Short Term Goal #1 Start Date 08/31/19  Interventions for Short Term Goal #1 RNCM confirmed pt has contact info for MDs and assessed for no barriers getting to appts     Consent: [facility] services reviewed and discussed with patient. Verbal consent for services given.  Plan: RN CM will send welcome letter to patient. RN CM will send barriers letter and route encounter to PCP. RN CM discussed with  patient next outreach within the month of Sept. Patient gave verbal consent and in agreement with RN CM follow up and timeframe. Patient aware that they may contact RN CM sooner for any issues or concerns.  Enzo Montgomery, RN,BSN,CCM [place] Management Telephonic Care Management Coordinator Direct Phone: [phone] Toll Free: [phone] Fax: [phone]

## 2019-09-02 DIAGNOSIS — N186 End stage renal disease: Secondary | ICD-10-CM | POA: Diagnosis not present

## 2019-09-02 DIAGNOSIS — Z992 Dependence on renal dialysis: Secondary | ICD-10-CM | POA: Diagnosis not present

## 2019-09-02 DIAGNOSIS — N2581 Secondary hyperparathyroidism of renal origin: Secondary | ICD-10-CM | POA: Diagnosis not present

## 2019-09-04 DIAGNOSIS — N2581 Secondary hyperparathyroidism of renal origin: Secondary | ICD-10-CM | POA: Diagnosis not present

## 2019-09-04 DIAGNOSIS — Z992 Dependence on renal dialysis: Secondary | ICD-10-CM | POA: Diagnosis not present

## 2019-09-04 DIAGNOSIS — N186 End stage renal disease: Secondary | ICD-10-CM | POA: Diagnosis not present

## 2019-09-07 DIAGNOSIS — N186 End stage renal disease: Secondary | ICD-10-CM | POA: Diagnosis not present

## 2019-09-07 DIAGNOSIS — N2581 Secondary hyperparathyroidism of renal origin: Secondary | ICD-10-CM | POA: Diagnosis not present

## 2019-09-07 DIAGNOSIS — Z992 Dependence on renal dialysis: Secondary | ICD-10-CM | POA: Diagnosis not present

## 2019-09-11 DIAGNOSIS — N2581 Secondary hyperparathyroidism of renal origin: Secondary | ICD-10-CM | POA: Diagnosis not present

## 2019-09-11 DIAGNOSIS — N186 End stage renal disease: Secondary | ICD-10-CM | POA: Diagnosis not present

## 2019-09-11 DIAGNOSIS — Z992 Dependence on renal dialysis: Secondary | ICD-10-CM | POA: Diagnosis not present

## 2019-09-12 DIAGNOSIS — Z992 Dependence on renal dialysis: Secondary | ICD-10-CM | POA: Diagnosis not present

## 2019-09-12 DIAGNOSIS — N186 End stage renal disease: Secondary | ICD-10-CM | POA: Diagnosis not present

## 2019-09-12 DIAGNOSIS — I120 Hypertensive chronic kidney disease with stage 5 chronic kidney disease or end stage renal disease: Secondary | ICD-10-CM | POA: Diagnosis not present

## 2019-09-14 DIAGNOSIS — N186 End stage renal disease: Secondary | ICD-10-CM | POA: Diagnosis not present

## 2019-09-14 DIAGNOSIS — Z992 Dependence on renal dialysis: Secondary | ICD-10-CM | POA: Diagnosis not present

## 2019-09-14 DIAGNOSIS — N2581 Secondary hyperparathyroidism of renal origin: Secondary | ICD-10-CM | POA: Diagnosis not present

## 2019-09-17 DIAGNOSIS — N2581 Secondary hyperparathyroidism of renal origin: Secondary | ICD-10-CM | POA: Diagnosis not present

## 2019-09-17 DIAGNOSIS — N186 End stage renal disease: Secondary | ICD-10-CM | POA: Diagnosis not present

## 2019-09-17 DIAGNOSIS — Z992 Dependence on renal dialysis: Secondary | ICD-10-CM | POA: Diagnosis not present

## 2019-09-18 DIAGNOSIS — N2581 Secondary hyperparathyroidism of renal origin: Secondary | ICD-10-CM | POA: Diagnosis not present

## 2019-09-18 DIAGNOSIS — N186 End stage renal disease: Secondary | ICD-10-CM | POA: Diagnosis not present

## 2019-09-18 DIAGNOSIS — Z992 Dependence on renal dialysis: Secondary | ICD-10-CM | POA: Diagnosis not present

## 2019-09-21 DIAGNOSIS — Z992 Dependence on renal dialysis: Secondary | ICD-10-CM | POA: Diagnosis not present

## 2019-09-21 DIAGNOSIS — N2581 Secondary hyperparathyroidism of renal origin: Secondary | ICD-10-CM | POA: Diagnosis not present

## 2019-09-21 DIAGNOSIS — N186 End stage renal disease: Secondary | ICD-10-CM | POA: Diagnosis not present

## 2019-09-23 DIAGNOSIS — N2581 Secondary hyperparathyroidism of renal origin: Secondary | ICD-10-CM | POA: Diagnosis not present

## 2019-09-23 DIAGNOSIS — Z992 Dependence on renal dialysis: Secondary | ICD-10-CM | POA: Diagnosis not present

## 2019-09-23 DIAGNOSIS — N186 End stage renal disease: Secondary | ICD-10-CM | POA: Diagnosis not present

## 2019-09-28 DIAGNOSIS — N2581 Secondary hyperparathyroidism of renal origin: Secondary | ICD-10-CM | POA: Diagnosis not present

## 2019-09-28 DIAGNOSIS — N186 End stage renal disease: Secondary | ICD-10-CM | POA: Diagnosis not present

## 2019-09-28 DIAGNOSIS — Z992 Dependence on renal dialysis: Secondary | ICD-10-CM | POA: Diagnosis not present

## 2019-10-07 DIAGNOSIS — N2581 Secondary hyperparathyroidism of renal origin: Secondary | ICD-10-CM | POA: Diagnosis not present

## 2019-10-07 DIAGNOSIS — Z992 Dependence on renal dialysis: Secondary | ICD-10-CM | POA: Diagnosis not present

## 2019-10-07 DIAGNOSIS — N186 End stage renal disease: Secondary | ICD-10-CM | POA: Diagnosis not present

## 2019-10-09 DIAGNOSIS — N186 End stage renal disease: Secondary | ICD-10-CM | POA: Diagnosis not present

## 2019-10-09 DIAGNOSIS — N2581 Secondary hyperparathyroidism of renal origin: Secondary | ICD-10-CM | POA: Diagnosis not present

## 2019-10-09 DIAGNOSIS — Z992 Dependence on renal dialysis: Secondary | ICD-10-CM | POA: Diagnosis not present

## 2019-10-13 DIAGNOSIS — N186 End stage renal disease: Secondary | ICD-10-CM | POA: Diagnosis not present

## 2019-10-13 DIAGNOSIS — Z992 Dependence on renal dialysis: Secondary | ICD-10-CM | POA: Diagnosis not present

## 2019-10-13 DIAGNOSIS — I120 Hypertensive chronic kidney disease with stage 5 chronic kidney disease or end stage renal disease: Secondary | ICD-10-CM | POA: Diagnosis not present

## 2019-10-14 DIAGNOSIS — Z992 Dependence on renal dialysis: Secondary | ICD-10-CM | POA: Diagnosis not present

## 2019-10-14 DIAGNOSIS — N186 End stage renal disease: Secondary | ICD-10-CM | POA: Diagnosis not present

## 2019-10-14 DIAGNOSIS — N2581 Secondary hyperparathyroidism of renal origin: Secondary | ICD-10-CM | POA: Diagnosis not present

## 2019-10-16 DIAGNOSIS — N186 End stage renal disease: Secondary | ICD-10-CM | POA: Diagnosis not present

## 2019-10-16 DIAGNOSIS — N2581 Secondary hyperparathyroidism of renal origin: Secondary | ICD-10-CM | POA: Diagnosis not present

## 2019-10-16 DIAGNOSIS — Z992 Dependence on renal dialysis: Secondary | ICD-10-CM | POA: Diagnosis not present

## 2019-10-19 DIAGNOSIS — Z992 Dependence on renal dialysis: Secondary | ICD-10-CM | POA: Diagnosis not present

## 2019-10-19 DIAGNOSIS — N2581 Secondary hyperparathyroidism of renal origin: Secondary | ICD-10-CM | POA: Diagnosis not present

## 2019-10-19 DIAGNOSIS — N186 End stage renal disease: Secondary | ICD-10-CM | POA: Diagnosis not present

## 2019-10-20 ENCOUNTER — Other Ambulatory Visit: Payer: Self-pay

## 2019-10-20 NOTE — Patient Outreach (Signed)
[place] [facility]) Care Management  10/20/2019  Daniel Whitney 01/28/1956 [id number]   Telephone Assessment    Outreach attempt #1 to patient. Spoke briefly with patient who reported he was headed out and unable to talk at present.     Plan: RN CM will make outreach attempt to patient at later time.    Enzo Montgomery, RN,BSN,CCM [place] Management Telephonic Care Management Coordinator Direct Phone: [phone] Toll Free: [phone] Fax: [phone]

## 2019-10-21 ENCOUNTER — Ambulatory Visit: Payer: Self-pay

## 2019-10-23 DIAGNOSIS — N186 End stage renal disease: Secondary | ICD-10-CM | POA: Diagnosis not present

## 2019-10-23 DIAGNOSIS — Z992 Dependence on renal dialysis: Secondary | ICD-10-CM | POA: Diagnosis not present

## 2019-10-23 DIAGNOSIS — N2581 Secondary hyperparathyroidism of renal origin: Secondary | ICD-10-CM | POA: Diagnosis not present

## 2019-10-27 DIAGNOSIS — Z992 Dependence on renal dialysis: Secondary | ICD-10-CM | POA: Diagnosis not present

## 2019-10-27 DIAGNOSIS — N2581 Secondary hyperparathyroidism of renal origin: Secondary | ICD-10-CM | POA: Diagnosis not present

## 2019-10-27 DIAGNOSIS — N186 End stage renal disease: Secondary | ICD-10-CM | POA: Diagnosis not present

## 2019-11-04 DIAGNOSIS — N2581 Secondary hyperparathyroidism of renal origin: Secondary | ICD-10-CM | POA: Diagnosis not present

## 2019-11-04 DIAGNOSIS — Z992 Dependence on renal dialysis: Secondary | ICD-10-CM | POA: Diagnosis not present

## 2019-11-04 DIAGNOSIS — N186 End stage renal disease: Secondary | ICD-10-CM | POA: Diagnosis not present

## 2019-11-05 DIAGNOSIS — M25511 Pain in right shoulder: Secondary | ICD-10-CM | POA: Diagnosis not present

## 2019-11-05 DIAGNOSIS — M19012 Primary osteoarthritis, left shoulder: Secondary | ICD-10-CM | POA: Diagnosis not present

## 2019-11-05 DIAGNOSIS — M25512 Pain in left shoulder: Secondary | ICD-10-CM | POA: Diagnosis not present

## 2019-11-05 DIAGNOSIS — M19011 Primary osteoarthritis, right shoulder: Secondary | ICD-10-CM | POA: Diagnosis not present

## 2019-11-05 DIAGNOSIS — M25561 Pain in right knee: Secondary | ICD-10-CM | POA: Diagnosis not present

## 2019-11-05 DIAGNOSIS — Z96651 Presence of right artificial knee joint: Secondary | ICD-10-CM | POA: Diagnosis not present

## 2019-11-06 DIAGNOSIS — Z992 Dependence on renal dialysis: Secondary | ICD-10-CM | POA: Diagnosis not present

## 2019-11-06 DIAGNOSIS — N2581 Secondary hyperparathyroidism of renal origin: Secondary | ICD-10-CM | POA: Diagnosis not present

## 2019-11-06 DIAGNOSIS — N186 End stage renal disease: Secondary | ICD-10-CM | POA: Diagnosis not present

## 2019-11-09 DIAGNOSIS — N2581 Secondary hyperparathyroidism of renal origin: Secondary | ICD-10-CM | POA: Diagnosis not present

## 2019-11-09 DIAGNOSIS — Z992 Dependence on renal dialysis: Secondary | ICD-10-CM | POA: Diagnosis not present

## 2019-11-09 DIAGNOSIS — N186 End stage renal disease: Secondary | ICD-10-CM | POA: Diagnosis not present

## 2019-11-10 ENCOUNTER — Other Ambulatory Visit: Payer: Self-pay

## 2019-11-10 NOTE — Patient Outreach (Signed)
[place] [facility]) Care Management  11/10/2019  Daniel Whitney 1956/01/10 [id number]   Telephone Assessment   Unsuccessful outreach attempt to patient.     Plan: RN CM will make outreach attempt to patient within the month of Oct.   Zacharias Ridling Verl Blalock [place] Management Telephonic Care Management Coordinator Direct Phone: [phone] Toll Free: [phone] Fax: [phone]

## 2019-11-11 DIAGNOSIS — N2581 Secondary hyperparathyroidism of renal origin: Secondary | ICD-10-CM | POA: Diagnosis not present

## 2019-11-11 DIAGNOSIS — N186 End stage renal disease: Secondary | ICD-10-CM | POA: Diagnosis not present

## 2019-11-11 DIAGNOSIS — Z992 Dependence on renal dialysis: Secondary | ICD-10-CM | POA: Diagnosis not present

## 2019-11-12 DIAGNOSIS — N186 End stage renal disease: Secondary | ICD-10-CM | POA: Diagnosis not present

## 2019-11-12 DIAGNOSIS — I120 Hypertensive chronic kidney disease with stage 5 chronic kidney disease or end stage renal disease: Secondary | ICD-10-CM | POA: Diagnosis not present

## 2019-11-12 DIAGNOSIS — Z992 Dependence on renal dialysis: Secondary | ICD-10-CM | POA: Diagnosis not present

## 2019-11-14 DIAGNOSIS — Z992 Dependence on renal dialysis: Secondary | ICD-10-CM | POA: Diagnosis not present

## 2019-11-14 DIAGNOSIS — N186 End stage renal disease: Secondary | ICD-10-CM | POA: Diagnosis not present

## 2019-11-14 DIAGNOSIS — N2581 Secondary hyperparathyroidism of renal origin: Secondary | ICD-10-CM | POA: Diagnosis not present

## 2019-11-16 DIAGNOSIS — Z992 Dependence on renal dialysis: Secondary | ICD-10-CM | POA: Diagnosis not present

## 2019-11-16 DIAGNOSIS — N2581 Secondary hyperparathyroidism of renal origin: Secondary | ICD-10-CM | POA: Diagnosis not present

## 2019-11-16 DIAGNOSIS — N186 End stage renal disease: Secondary | ICD-10-CM | POA: Diagnosis not present

## 2019-11-18 DIAGNOSIS — N2581 Secondary hyperparathyroidism of renal origin: Secondary | ICD-10-CM | POA: Diagnosis not present

## 2019-11-18 DIAGNOSIS — Z992 Dependence on renal dialysis: Secondary | ICD-10-CM | POA: Diagnosis not present

## 2019-11-18 DIAGNOSIS — N186 End stage renal disease: Secondary | ICD-10-CM | POA: Diagnosis not present

## 2019-11-21 DIAGNOSIS — Z992 Dependence on renal dialysis: Secondary | ICD-10-CM | POA: Diagnosis not present

## 2019-11-21 DIAGNOSIS — N2581 Secondary hyperparathyroidism of renal origin: Secondary | ICD-10-CM | POA: Diagnosis not present

## 2019-11-21 DIAGNOSIS — N186 End stage renal disease: Secondary | ICD-10-CM | POA: Diagnosis not present

## 2019-11-24 ENCOUNTER — Other Ambulatory Visit: Payer: Self-pay

## 2019-11-24 DIAGNOSIS — N186 End stage renal disease: Secondary | ICD-10-CM | POA: Diagnosis not present

## 2019-11-24 DIAGNOSIS — Z992 Dependence on renal dialysis: Secondary | ICD-10-CM | POA: Diagnosis not present

## 2019-11-24 DIAGNOSIS — N2581 Secondary hyperparathyroidism of renal origin: Secondary | ICD-10-CM | POA: Diagnosis not present

## 2019-11-24 NOTE — Patient Outreach (Signed)
[place] [facility]) Care Management  11/24/2019  Daniel Whitney [birth year] [id number]   Telephone Assessment   Outreach attempt to patient. No answer. RN CM left HIPAA compliant voicemail message along with contact info.    Plan: RN CM will make quarterly outreach attempt to patient within the month of Nov if no return call from patient.    Enzo Montgomery, RN,BSN,CCM [place] Management Telephonic Care Management Coordinator Direct Phone: [phone] Toll Free: [phone] Fax: [phone]

## 2019-11-25 ENCOUNTER — Ambulatory Visit: Payer: Self-pay

## 2019-11-25 DIAGNOSIS — Z992 Dependence on renal dialysis: Secondary | ICD-10-CM | POA: Diagnosis not present

## 2019-11-25 DIAGNOSIS — N2581 Secondary hyperparathyroidism of renal origin: Secondary | ICD-10-CM | POA: Diagnosis not present

## 2019-11-25 DIAGNOSIS — N186 End stage renal disease: Secondary | ICD-10-CM | POA: Diagnosis not present

## 2019-11-27 DIAGNOSIS — N2581 Secondary hyperparathyroidism of renal origin: Secondary | ICD-10-CM | POA: Diagnosis not present

## 2019-11-27 DIAGNOSIS — Z992 Dependence on renal dialysis: Secondary | ICD-10-CM | POA: Diagnosis not present

## 2019-11-27 DIAGNOSIS — N186 End stage renal disease: Secondary | ICD-10-CM | POA: Diagnosis not present

## 2019-12-02 DIAGNOSIS — Z992 Dependence on renal dialysis: Secondary | ICD-10-CM | POA: Diagnosis not present

## 2019-12-02 DIAGNOSIS — N186 End stage renal disease: Secondary | ICD-10-CM | POA: Diagnosis not present

## 2019-12-02 DIAGNOSIS — N2581 Secondary hyperparathyroidism of renal origin: Secondary | ICD-10-CM | POA: Diagnosis not present

## 2019-12-08 DIAGNOSIS — Z992 Dependence on renal dialysis: Secondary | ICD-10-CM | POA: Diagnosis not present

## 2019-12-08 DIAGNOSIS — N186 End stage renal disease: Secondary | ICD-10-CM | POA: Diagnosis not present

## 2019-12-08 DIAGNOSIS — N2581 Secondary hyperparathyroidism of renal origin: Secondary | ICD-10-CM | POA: Diagnosis not present

## 2019-12-11 DIAGNOSIS — N186 End stage renal disease: Secondary | ICD-10-CM | POA: Diagnosis not present

## 2019-12-11 DIAGNOSIS — N2581 Secondary hyperparathyroidism of renal origin: Secondary | ICD-10-CM | POA: Diagnosis not present

## 2019-12-11 DIAGNOSIS — Z992 Dependence on renal dialysis: Secondary | ICD-10-CM | POA: Diagnosis not present

## 2019-12-13 DIAGNOSIS — I120 Hypertensive chronic kidney disease with stage 5 chronic kidney disease or end stage renal disease: Secondary | ICD-10-CM | POA: Diagnosis not present

## 2019-12-13 DIAGNOSIS — N186 End stage renal disease: Secondary | ICD-10-CM | POA: Diagnosis not present

## 2019-12-13 DIAGNOSIS — Z992 Dependence on renal dialysis: Secondary | ICD-10-CM | POA: Diagnosis not present

## 2019-12-19 DIAGNOSIS — Z992 Dependence on renal dialysis: Secondary | ICD-10-CM | POA: Diagnosis not present

## 2019-12-19 DIAGNOSIS — N186 End stage renal disease: Secondary | ICD-10-CM | POA: Diagnosis not present

## 2019-12-19 DIAGNOSIS — N2581 Secondary hyperparathyroidism of renal origin: Secondary | ICD-10-CM | POA: Diagnosis not present

## 2019-12-21 DIAGNOSIS — Z992 Dependence on renal dialysis: Secondary | ICD-10-CM | POA: Diagnosis not present

## 2019-12-21 DIAGNOSIS — N2581 Secondary hyperparathyroidism of renal origin: Secondary | ICD-10-CM | POA: Diagnosis not present

## 2019-12-21 DIAGNOSIS — N186 End stage renal disease: Secondary | ICD-10-CM | POA: Diagnosis not present

## 2019-12-23 DIAGNOSIS — Z992 Dependence on renal dialysis: Secondary | ICD-10-CM | POA: Diagnosis not present

## 2019-12-23 DIAGNOSIS — N186 End stage renal disease: Secondary | ICD-10-CM | POA: Diagnosis not present

## 2019-12-23 DIAGNOSIS — N2581 Secondary hyperparathyroidism of renal origin: Secondary | ICD-10-CM | POA: Diagnosis not present

## 2019-12-25 DIAGNOSIS — N186 End stage renal disease: Secondary | ICD-10-CM | POA: Diagnosis not present

## 2019-12-25 DIAGNOSIS — Z992 Dependence on renal dialysis: Secondary | ICD-10-CM | POA: Diagnosis not present

## 2019-12-25 DIAGNOSIS — N2581 Secondary hyperparathyroidism of renal origin: Secondary | ICD-10-CM | POA: Diagnosis not present

## 2019-12-28 DIAGNOSIS — N186 End stage renal disease: Secondary | ICD-10-CM | POA: Diagnosis not present

## 2019-12-28 DIAGNOSIS — N2581 Secondary hyperparathyroidism of renal origin: Secondary | ICD-10-CM | POA: Diagnosis not present

## 2019-12-28 DIAGNOSIS — Z992 Dependence on renal dialysis: Secondary | ICD-10-CM | POA: Diagnosis not present

## 2019-12-29 ENCOUNTER — Other Ambulatory Visit: Payer: Self-pay

## 2019-12-29 NOTE — Patient Outreach (Signed)
[place] [facility]) Care Management  12/29/2019  Daniel Whitney 1955/11/21 [id number]   Telephone Assessment    Outreach attempt to patient. No answer. RN CM left HIPAA compliant voicemail message along with contact info.      Plan: RN CM will make outreach attempt to patient within the month of Dec if no return call from patient.    Enzo Montgomery, RN,BSN,CCM [place] Management Telephonic Care Management Coordinator Direct Phone: [phone] Toll Free: [phone] Fax: [phone]

## 2019-12-30 DIAGNOSIS — Z992 Dependence on renal dialysis: Secondary | ICD-10-CM | POA: Diagnosis not present

## 2019-12-30 DIAGNOSIS — N186 End stage renal disease: Secondary | ICD-10-CM | POA: Diagnosis not present

## 2019-12-30 DIAGNOSIS — N2581 Secondary hyperparathyroidism of renal origin: Secondary | ICD-10-CM | POA: Diagnosis not present

## 2020-01-01 DIAGNOSIS — N2581 Secondary hyperparathyroidism of renal origin: Secondary | ICD-10-CM | POA: Diagnosis not present

## 2020-01-01 DIAGNOSIS — Z992 Dependence on renal dialysis: Secondary | ICD-10-CM | POA: Diagnosis not present

## 2020-01-01 DIAGNOSIS — N186 End stage renal disease: Secondary | ICD-10-CM | POA: Diagnosis not present

## 2020-01-03 DIAGNOSIS — N2581 Secondary hyperparathyroidism of renal origin: Secondary | ICD-10-CM | POA: Diagnosis not present

## 2020-01-03 DIAGNOSIS — N186 End stage renal disease: Secondary | ICD-10-CM | POA: Diagnosis not present

## 2020-01-03 DIAGNOSIS — Z992 Dependence on renal dialysis: Secondary | ICD-10-CM | POA: Diagnosis not present

## 2020-01-11 ENCOUNTER — Other Ambulatory Visit: Payer: Self-pay

## 2020-01-11 ENCOUNTER — Encounter (HOSPITAL_COMMUNITY): Payer: Self-pay | Admitting: *Deleted

## 2020-01-11 ENCOUNTER — Observation Stay (HOSPITAL_COMMUNITY)
Admission: EM | Admit: 2020-01-11 | Discharge: 2020-01-12 | Disposition: A | Discharge status: Home / Self Care | Payer: Medicare HMO | Attending: Internal Medicine | Admitting: Internal Medicine

## 2020-01-11 ENCOUNTER — Emergency Department (HOSPITAL_COMMUNITY): Payer: Medicare HMO

## 2020-01-11 DIAGNOSIS — Z20822 Contact with and (suspected) exposure to covid-19: Secondary | ICD-10-CM | POA: Diagnosis not present

## 2020-01-11 DIAGNOSIS — E11319 Type 2 diabetes mellitus with unspecified diabetic retinopathy without macular edema: Secondary | ICD-10-CM | POA: Diagnosis not present

## 2020-01-11 DIAGNOSIS — G4733 Obstructive sleep apnea (adult) (pediatric): Secondary | ICD-10-CM | POA: Insufficient documentation

## 2020-01-11 DIAGNOSIS — I132 Hypertensive heart and chronic kidney disease with heart failure and with stage 5 chronic kidney disease, or end stage renal disease: Secondary | ICD-10-CM | POA: Diagnosis not present

## 2020-01-11 DIAGNOSIS — E875 Hyperkalemia: Secondary | ICD-10-CM | POA: Insufficient documentation

## 2020-01-11 DIAGNOSIS — N186 End stage renal disease: Secondary | ICD-10-CM | POA: Insufficient documentation

## 2020-01-11 DIAGNOSIS — E876 Hypokalemia: Secondary | ICD-10-CM | POA: Diagnosis not present

## 2020-01-11 DIAGNOSIS — Z23 Encounter for immunization: Secondary | ICD-10-CM | POA: Insufficient documentation

## 2020-01-11 DIAGNOSIS — D649 Anemia, unspecified: Secondary | ICD-10-CM | POA: Diagnosis not present

## 2020-01-11 DIAGNOSIS — Z79899 Other long term (current) drug therapy: Secondary | ICD-10-CM | POA: Insufficient documentation

## 2020-01-11 DIAGNOSIS — I4891 Unspecified atrial fibrillation: Secondary | ICD-10-CM | POA: Insufficient documentation

## 2020-01-11 DIAGNOSIS — I480 Paroxysmal atrial fibrillation: Secondary | ICD-10-CM | POA: Diagnosis present

## 2020-01-11 DIAGNOSIS — E1140 Type 2 diabetes mellitus with diabetic neuropathy, unspecified: Secondary | ICD-10-CM | POA: Diagnosis not present

## 2020-01-11 DIAGNOSIS — R000 Tachycardia, unspecified: Secondary | ICD-10-CM | POA: Diagnosis not present

## 2020-01-11 DIAGNOSIS — E039 Hypothyroidism, unspecified: Secondary | ICD-10-CM | POA: Diagnosis not present

## 2020-01-11 DIAGNOSIS — E669 Obesity, unspecified: Secondary | ICD-10-CM | POA: Insufficient documentation

## 2020-01-11 DIAGNOSIS — R4182 Altered mental status, unspecified: Secondary | ICD-10-CM | POA: Diagnosis not present

## 2020-01-11 DIAGNOSIS — E785 Hyperlipidemia, unspecified: Secondary | ICD-10-CM

## 2020-01-11 DIAGNOSIS — Z96653 Presence of artificial knee joint, bilateral: Secondary | ICD-10-CM | POA: Diagnosis not present

## 2020-01-11 DIAGNOSIS — I120 Hypertensive chronic kidney disease with stage 5 chronic kidney disease or end stage renal disease: Secondary | ICD-10-CM | POA: Diagnosis not present

## 2020-01-11 DIAGNOSIS — R531 Weakness: Secondary | ICD-10-CM | POA: Diagnosis not present

## 2020-01-11 DIAGNOSIS — R197 Diarrhea, unspecified: Secondary | ICD-10-CM | POA: Diagnosis not present

## 2020-01-11 DIAGNOSIS — Z992 Dependence on renal dialysis: Secondary | ICD-10-CM

## 2020-01-11 DIAGNOSIS — I10 Essential (primary) hypertension: Secondary | ICD-10-CM | POA: Diagnosis present

## 2020-01-11 DIAGNOSIS — Z9115 Patient's noncompliance with renal dialysis: Principal | ICD-10-CM

## 2020-01-11 DIAGNOSIS — I5043 Acute on chronic combined systolic (congestive) and diastolic (congestive) heart failure: Secondary | ICD-10-CM | POA: Insufficient documentation

## 2020-01-11 DIAGNOSIS — E1129 Type 2 diabetes mellitus with other diabetic kidney complication: Secondary | ICD-10-CM | POA: Diagnosis not present

## 2020-01-11 DIAGNOSIS — Z96641 Presence of right artificial hip joint: Secondary | ICD-10-CM | POA: Insufficient documentation

## 2020-01-11 DIAGNOSIS — I213 ST elevation (STEMI) myocardial infarction of unspecified site: Secondary | ICD-10-CM | POA: Diagnosis not present

## 2020-01-11 DIAGNOSIS — R059 Cough, unspecified: Secondary | ICD-10-CM | POA: Diagnosis not present

## 2020-01-11 DIAGNOSIS — R7303 Prediabetes: Secondary | ICD-10-CM | POA: Diagnosis present

## 2020-01-11 LAB — CBC
HCT: 29.6 % — ABNORMAL LOW (ref 39.0–52.0)
Hemoglobin: 9.4 g/dL — ABNORMAL LOW (ref 13.0–17.0)
MCH: 29.9 pg (ref 26.0–34.0)
MCHC: 31.8 g/dL (ref 30.0–36.0)
MCV: 94.3 fL (ref 80.0–100.0)
Platelets: 165 10*3/uL (ref 150–400)
RBC: 3.14 MIL/uL — ABNORMAL LOW (ref 4.22–5.81)
RDW: 17.2 % — ABNORMAL HIGH (ref 11.5–15.5)
WBC: 7.9 10*3/uL (ref 4.0–10.5)
nRBC: 0 % (ref 0.0–0.2)

## 2020-01-11 LAB — CBC WITH DIFFERENTIAL/PLATELET
Abs Immature Granulocytes: 0.04 10*3/uL (ref 0.00–0.07)
Basophils Absolute: 0.1 10*3/uL (ref 0.0–0.1)
Basophils Relative: 1 %
Eosinophils Absolute: 0.4 10*3/uL (ref 0.0–0.5)
Eosinophils Relative: 4 %
HCT: 32.7 % — ABNORMAL LOW (ref 39.0–52.0)
Hemoglobin: 10.1 g/dL — ABNORMAL LOW (ref 13.0–17.0)
Immature Granulocytes: 1 %
Lymphocytes Relative: 7 %
Lymphs Abs: 0.6 10*3/uL — ABNORMAL LOW (ref 0.7–4.0)
MCH: 29.4 pg (ref 26.0–34.0)
MCHC: 30.9 g/dL (ref 30.0–36.0)
MCV: 95.3 fL (ref 80.0–100.0)
Monocytes Absolute: 0.8 10*3/uL (ref 0.1–1.0)
Monocytes Relative: 9 %
Neutro Abs: 6.5 10*3/uL (ref 1.7–7.7)
Neutrophils Relative %: 78 %
Platelets: 178 10*3/uL (ref 150–400)
RBC: 3.43 MIL/uL — ABNORMAL LOW (ref 4.22–5.81)
RDW: 17.2 % — ABNORMAL HIGH (ref 11.5–15.5)
WBC: 8.4 10*3/uL (ref 4.0–10.5)
nRBC: 0 % (ref 0.0–0.2)

## 2020-01-11 LAB — I-STAT CHEM 8, ED
BUN: 113 mg/dL — ABNORMAL HIGH (ref 8–23)
Calcium, Ion: 0.9 mmol/L — ABNORMAL LOW (ref 1.15–1.40)
Chloride: 103 mmol/L (ref 98–111)
Creatinine, Ser: 16.1 mg/dL — ABNORMAL HIGH (ref 0.61–1.24)
Glucose, Bld: 105 mg/dL — ABNORMAL HIGH (ref 70–99)
HCT: 34 % — ABNORMAL LOW (ref 39.0–52.0)
Hemoglobin: 11.6 g/dL — ABNORMAL LOW (ref 13.0–17.0)
Potassium: 8.3 mmol/L (ref 3.5–5.1)
Sodium: 126 mmol/L — ABNORMAL LOW (ref 135–145)
TCO2: 17 mmol/L — ABNORMAL LOW (ref 22–32)

## 2020-01-11 LAB — COMPREHENSIVE METABOLIC PANEL
ALT: 23 U/L (ref 0–44)
AST: 10 U/L — ABNORMAL LOW (ref 15–41)
Albumin: 3.6 g/dL (ref 3.5–5.0)
Alkaline Phosphatase: 157 U/L — ABNORMAL HIGH (ref 38–126)
Anion gap: 21 — ABNORMAL HIGH (ref 5–15)
BUN: 106 mg/dL — ABNORMAL HIGH (ref 8–23)
CO2: 16 mmol/L — ABNORMAL LOW (ref 22–32)
Calcium: 8.8 mg/dL — ABNORMAL LOW (ref 8.9–10.3)
Chloride: 92 mmol/L — ABNORMAL LOW (ref 98–111)
Creatinine, Ser: 14.62 mg/dL — ABNORMAL HIGH (ref 0.61–1.24)
GFR, Estimated: 3 mL/min — ABNORMAL LOW (ref >60)
Glucose, Bld: 116 mg/dL — ABNORMAL HIGH (ref 70–99)
Potassium: >7.5 mmol/L (ref 3.5–5.1)
Sodium: 129 mmol/L — ABNORMAL LOW (ref 135–145)
Total Bilirubin: 0.7 mg/dL (ref 0.3–1.2)
Total Protein: 8.1 g/dL (ref 6.5–8.1)

## 2020-01-11 LAB — RENAL FUNCTION PANEL
Albumin: 3.3 g/dL — ABNORMAL LOW (ref 3.5–5.0)
Anion gap: 20 — ABNORMAL HIGH (ref 5–15)
BUN: 109 mg/dL — ABNORMAL HIGH (ref 8–23)
CO2: 18 mmol/L — ABNORMAL LOW (ref 22–32)
Calcium: 8.8 mg/dL — ABNORMAL LOW (ref 8.9–10.3)
Chloride: 94 mmol/L — ABNORMAL LOW (ref 98–111)
Creatinine, Ser: 15.31 mg/dL — ABNORMAL HIGH (ref 0.61–1.24)
GFR, Estimated: 3 mL/min — ABNORMAL LOW (ref >60)
Glucose, Bld: 98 mg/dL (ref 70–99)
Phosphorus: 10.2 mg/dL — ABNORMAL HIGH (ref 2.5–4.6)
Potassium: 6.9 mmol/L (ref 3.5–5.1)
Sodium: 132 mmol/L — ABNORMAL LOW (ref 135–145)

## 2020-01-11 LAB — CBG MONITORING, ED
Glucose-Capillary: 95 mg/dL (ref 70–99)
Glucose-Capillary: 87 mg/dL (ref 70–99)

## 2020-01-11 LAB — RESP PANEL BY RT-PCR (FLU A&B, COVID) ARPGX2
Influenza A by PCR: NEGATIVE
Influenza B by PCR: NEGATIVE
SARS Coronavirus 2 by RT PCR: NEGATIVE

## 2020-01-11 LAB — TROPONIN I (HIGH SENSITIVITY)
Troponin I (High Sensitivity): 47 ng/L — ABNORMAL HIGH (ref <18)
Troponin I (High Sensitivity): 60 ng/L — ABNORMAL HIGH (ref <18)

## 2020-01-11 LAB — GLUCOSE, CAPILLARY: Glucose-Capillary: 103 mg/dL — ABNORMAL HIGH (ref 70–99)

## 2020-01-11 MED ORDER — ALLOPURINOL 100 MG PO TABS: 100.0000 mg | ORAL_TABLET | Freq: Two times a day (BID) | ORAL | Status: DC

## 2020-01-11 MED ORDER — SORBITOL 70 % SOLN
30.0000 mL | Status: DC | PRN
  Filled 2020-01-11: qty 30

## 2020-01-11 MED ORDER — HYDRALAZINE HCL 20 MG/ML IJ SOLN: 5.0000 mg | INTRAMUSCULAR | Status: DC | PRN

## 2020-01-11 MED ORDER — CAMPHOR-MENTHOL 0.5-0.5 % EX LOTN
1.0000 "application " | TOPICAL_LOTION | Freq: Three times a day (TID) | CUTANEOUS | Status: DC | PRN
  Filled 2020-01-11: qty 222

## 2020-01-11 MED ORDER — SODIUM BICARBONATE 8.4 % IV SOLN
50.0000 meq | Freq: Once | INTRAVENOUS | Status: AC
  Administered 2020-01-11: 50 meq via INTRAVENOUS
  Filled 2020-01-11: qty 50

## 2020-01-11 MED ORDER — AMITRIPTYLINE HCL 25 MG PO TABS
50.0000 mg | ORAL_TABLET | Freq: Every day | ORAL | Status: DC
  Administered 2020-01-11: 50 mg via ORAL
  Filled 2020-01-11: qty 2

## 2020-01-11 MED ORDER — ZOLPIDEM TARTRATE 5 MG PO TABS: 5.0000 mg | ORAL_TABLET | Freq: Every evening | ORAL | Status: DC | PRN

## 2020-01-11 MED ORDER — HEPARIN SODIUM (PORCINE) 5000 UNIT/ML IJ SOLN
5000.0000 [IU] | Freq: Three times a day (TID) | INTRAMUSCULAR | Status: DC
  Administered 2020-01-11 – 2020-01-12 (×2): 5000 [IU] via SUBCUTANEOUS
  Filled 2020-01-11 (×2): qty 1

## 2020-01-11 MED ORDER — SIMVASTATIN 20 MG PO TABS
20.0000 mg | ORAL_TABLET | Freq: Every day | ORAL | Status: DC
  Administered 2020-01-11: 20 mg via ORAL
  Filled 2020-01-11: qty 1

## 2020-01-11 MED ORDER — ACETAMINOPHEN 325 MG PO TABS
650.0000 mg | ORAL_TABLET | Freq: Four times a day (QID) | ORAL | Status: DC | PRN
  Administered 2020-01-11 – 2020-01-12 (×2): 650 mg via ORAL
  Filled 2020-01-11 (×2): qty 2

## 2020-01-11 MED ORDER — CINACALCET HCL 30 MG PO TABS
30.0000 mg | ORAL_TABLET | Freq: Every day | ORAL | Status: DC
  Filled 2020-01-11 (×2): qty 1

## 2020-01-11 MED ORDER — NEPRO/CARBSTEADY PO LIQD
237.0000 mL | Freq: Three times a day (TID) | ORAL | Status: DC | PRN
  Filled 2020-01-11: qty 237

## 2020-01-11 MED ORDER — ONDANSETRON HCL 4 MG PO TABS: 4.0000 mg | ORAL_TABLET | Freq: Four times a day (QID) | ORAL | Status: DC | PRN

## 2020-01-11 MED ORDER — DEXTROSE 50 % IV SOLN
50.0000 mL | Freq: Once | INTRAVENOUS | Status: AC
  Administered 2020-01-11: 50 mL via INTRAVENOUS
  Filled 2020-01-11: qty 50

## 2020-01-11 MED ORDER — CALCIUM GLUCONATE-NACL 1-0.675 GM/50ML-% IV SOLN
1.0000 g | Freq: Once | INTRAVENOUS | Status: AC
  Administered 2020-01-11: 1000 mg via INTRAVENOUS
  Filled 2020-01-11: qty 50

## 2020-01-11 MED ORDER — GABAPENTIN 100 MG PO CAPS
100.0000 mg | ORAL_CAPSULE | Freq: Two times a day (BID) | ORAL | Status: DC
  Administered 2020-01-11 – 2020-01-12 (×2): 100 mg via ORAL
  Filled 2020-01-11 (×2): qty 1

## 2020-01-11 MED ORDER — ACETAMINOPHEN 650 MG RE SUPP: 650.0000 mg | Freq: Four times a day (QID) | RECTAL | Status: DC | PRN

## 2020-01-11 MED ORDER — INSULIN ASPART 100 UNIT/ML ~~LOC~~ SOLN
10.0000 [IU] | Freq: Once | SUBCUTANEOUS | Status: AC
  Administered 2020-01-11: 10 [IU] via INTRAVENOUS

## 2020-01-11 MED ORDER — ALBUTEROL (5 MG/ML) CONTINUOUS INHALATION SOLN
10.0000 mg/h | INHALATION_SOLUTION | Freq: Once | RESPIRATORY_TRACT | Status: AC
  Administered 2020-01-11: 10 mg/h via RESPIRATORY_TRACT
  Filled 2020-01-11: qty 20

## 2020-01-11 MED ORDER — ONDANSETRON HCL 4 MG/2ML IJ SOLN: 4.0000 mg | Freq: Four times a day (QID) | INTRAMUSCULAR | Status: DC | PRN

## 2020-01-11 MED ORDER — DOCUSATE SODIUM 283 MG RE ENEM
1.0000 | ENEMA | RECTAL | Status: DC | PRN
  Filled 2020-01-11: qty 1

## 2020-01-11 MED ORDER — CHLORHEXIDINE GLUCONATE CLOTH 2 % EX PADS: 6.0000 | MEDICATED_PAD | Freq: Every day | CUTANEOUS | Status: DC

## 2020-01-11 MED ORDER — HEPARIN SODIUM (PORCINE) 1000 UNIT/ML DIALYSIS: 3000.0000 [IU] | Freq: Once | INTRAMUSCULAR | Status: DC

## 2020-01-11 MED ORDER — HYDROXYZINE HCL 25 MG PO TABS: 25.0000 mg | ORAL_TABLET | Freq: Three times a day (TID) | ORAL | Status: DC | PRN

## 2020-01-11 MED ORDER — RENA-VITE PO TABS
1.0000 | ORAL_TABLET | Freq: Every day | ORAL | Status: DC
  Administered 2020-01-11: 1 via ORAL
  Filled 2020-01-11 (×2): qty 1

## 2020-01-11 MED ORDER — ALBUTEROL SULFATE (2.5 MG/3ML) 0.083% IN NEBU: 2.5000 mg | INHALATION_SOLUTION | RESPIRATORY_TRACT | Status: DC | PRN

## 2020-01-11 MED ORDER — SODIUM CHLORIDE 0.9% FLUSH
3.0000 mL | Freq: Two times a day (BID) | INTRAVENOUS | Status: DC
  Administered 2020-01-11 – 2020-01-12 (×2): 3 mL via INTRAVENOUS

## 2020-01-11 MED ORDER — CALCIUM CARBONATE ANTACID 1250 MG/5ML PO SUSP
500.0000 mg | Freq: Four times a day (QID) | ORAL | Status: DC | PRN
  Filled 2020-01-11: qty 5

## 2020-01-11 NOTE — ED Provider Notes (Signed)
[facility] EMERGENCY DEPARTMENT Provider Note   CSN: [id number] Arrival date & time: 01/11/20  0725     History Chief Complaint  Patient presents with  . Weakness  . Diarrhea    Daniel Whitney is a 64 y.o. male.  Patient is a 64 year old male with extensive past medical history including end-stage renal disease on hemodialysis, diabetes, congestive heart failure, GERD.  Patient presents today for evaluation of weakness and diarrhea.  This has been worsening over the past several days.  Patient was last dialyzed on Tuesday (6 days ago).  He missed dialysis over the Thanksgiving holiday.  He denies difficulty breathing.  He denies fevers or chills.  He denies any bloody stool or abdominal pain.  The history is provided by the patient.       Past Medical History:  Diagnosis Date  . Allergic rhinitis 07/07/2014  . Anemia due to other cause 07/07/2014  . Arthritis    "all over"  . Atrial fibrillation ([place]) 12/30/2012  . CHF (congestive heart failure) ([place]) 12/30/2012  . Chronic lower back pain   . Diabetes mellitus with neuropathy ([place])   . Diabetic retinopathy ([place]) 12/30/2012   no medications  . Dysrhythmia    Afib  . ESRD (end stage renal disease) on dialysis ([place]) started in 2013   MWF; [facility]; [employer]  . GERD (gastroesophageal reflux disease)   . Glaucoma   . History of gout    "right big toe"  . Hyperlipidemia   . Hypertension   . Hypothyroid    01/17/16- no longer on meduication  . Morbid obesity ([place]) 12/30/2012  . Myocardial infarction ([place])    "I've had ~ 3; last one was in ~ 10/2013" (01/30/2014)  . OSA (obstructive sleep apnea) 12/30/2012   "lost weight; no longer needed CPAP; retested said I needed it; didn't followup cause I was feeling fine" (01/30/2014)  . Pulmonary hypertension ([place]) 12/30/2012  . Refusal of blood transfusions as patient is Jehovah's Witness   . Stroke [facility]) ~ 2005; ~ 2005   "they were mild; I didn't even  notice I'd had them"; denies residual on 01/30/2014  . Subdural hematoma (Oct 2018)    Coumadin and heparin placed on hold at dc- pt did not FU w/ NS as recommended after dc so not on warfarin when admitted 04/03/17    Patient Active Problem List   Diagnosis Date Noted  . Prolonged QT interval 02/15/2019  . Shock ([place]) 02/24/2018  . Hyperkalemia, diminished renal excretion 01/01/2018  . Syncope and collapse 04/03/2017  . History of CVA (cerebrovascular accident) 04/03/2017  . CKD (chronic kidney disease) stage V requiring chronic dialysis ([place]) 04/03/2017  . Hypothyroid 04/03/2017  . Refusal of blood transfusions as patient is Jehovah's Witness 04/03/2017  . Prediabetes-HgbA1c 6.2 04/03/2017  . History of subdural hematoma Oct 2018 04/03/2017  . 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 Chronic atrial fibrillation 04/03/2017  . Subdural hematoma ([place]) 11/29/2016  . Peripheral neuropathy 07/26/2016  . At risk for adverse drug reaction 07/03/2016  . Primary osteoarthritis of left knee 06/26/2016  . Postoperative anemia due to acute blood loss 01/27/2016  . S/P knee replacement 01/24/2016  . Low back pain 02/09/2015  . Bilateral knee pain 01/27/2015  . Atypical atrial flutter ([place]) 01/24/2015  . Acute on chronic combined systolic and diastolic heart failure ([place]) 01/05/2015  . Hyperkalemia 11/08/2014  . Hypocalcemia 11/08/2014  . Recurrent falls 10/13/2014  . Venous  stasis ulcer ([place]) 10/13/2014  . Hypoxia   . Leukocytosis   . Respiratory failure ([place])   . ARDS (adult respiratory distress syndrome) ([place])   . Encounter for central line placement   . Cardiopulmonary arrest ([place])   . Compression fracture of L3 lumbar vertebra 08/04/2014  . Anemia of chronic renal failure, unspecified stage 07/07/2014  . Allergic rhinitis 07/07/2014  . Anemia of chronic disease 07/07/2014  . Right rotator cuff tear 04/27/2014  . Acute gouty arthritis  11/11/2013  . Hearing loss in right ear 11/11/2013  . Chronic pain syndrome 11/11/2013  . Right shoulder pain 11/11/2013  . Facial rash 11/01/2013  . Right otitis externa 10/27/2013  . Pulmonary HTN- PA 58 mmHg 04/16/13 09/17/2013  . Right hip pain 08/04/2013  . DOE (dyspnea on exertion) 06/18/2013  . Allergic rhinitis, cause unspecified 06/18/2013  . Carpal tunnel syndrome 06/06/2013  . Primary localized osteoarthrosis, lower leg 06/04/2013  . Preoperative cardiovascular examination 03/10/2013  . Preventative health care 12/30/2012  . AF (paroxysmal atrial fibrillation) ([place]) 12/30/2012  . CHF (congestive heart failure) ([place]) 12/30/2012  . Diabetic retinopathy ([place]) 12/30/2012  . Morbid obesity ([place]) 12/30/2012  . OSA (obstructive sleep apnea)- on C-pap 12/30/2012  . Bilateral hearing loss 12/30/2012  . Arthritis   . Glaucoma   . Diabetes mellitus with neuropathy ([place])   . Hypertension   . ESRD (end stage renal disease) on dialysis ([place])   . Stroke ([place])   . Hyperlipidemia     Past Surgical History:  Procedure Laterality Date  . AV FISTULA PLACEMENT Left ~ 10/2013   "forearm"  . CARDIAC CATHETERIZATION N/A 01/07/2015   Procedure: Left Heart Cath and Coronary Angiography;  Surgeon: Troy Sine, MD;  Location: [facility];  Service: Cardiovascular;  Laterality: N/A;  . CLOSED REDUCTION HIP DISLOCATION Right 1970's  . JOINT REPLACEMENT    . TOTAL HIP ARTHROPLASTY Right 1980  . TOTAL KNEE ARTHROPLASTY Right 01/24/2016   Procedure: TOTAL KNEE ARTHROPLASTY;  Surgeon: Dorna Leitz, MD;  Location: [facility];  Service: Orthopedics;  Laterality: Right;  . TOTAL KNEE ARTHROPLASTY Left 06/26/2016   Procedure: TOTAL KNEE ARTHROPLASTY;  Surgeon: Dorna Leitz, MD;  Location: [facility];  Service: Orthopedics;  Laterality: Left;       Family History  Problem Relation Age of Onset  . Arthritis Other   . Heart disease Other   . Hyperlipidemia Other   . Hypertension Other   . Kidney  disease Other   . Diabetes Other   . Stroke Other   . Diabetes Mother   . Cancer Father        bone cancer  . Heart disease Father   . Diabetes Sister   . Diabetes Brother   . Stroke Brother     Social History   Tobacco Use  . Smoking status: Never Smoker  . Smokeless tobacco: Never Used  Vaping Use  . Vaping Use: Never used  Substance Use Topics  . Alcohol use: Yes    Alcohol/week: 7.0 - 8.0 standard drinks    Types: 1 Cans of beer, 3 Shots of liquor, 3 - 4 Standard drinks or equivalent per week    Comment: 07/03/16 1 pint 2-3X/week  . Drug use: No    Home Medications Prior to Admission medications   Medication Sig Start Date End Date Taking? Authorizing Provider  allopurinol (ZYLOPRIM) 100 MG tablet Take 1 tablet (100 mg total) by mouth 2 (two) times daily. 07/31/16  Medina-Vargas, Monina C, NP  amitriptyline (ELAVIL) 50 MG tablet Take 1 tablet (50 mg total) by mouth at bedtime. 02/28/18   Rai, Ripudeep K, MD  b complex-vitamin c-folic acid (NEPHRO-VITE) 0.8 MG TABS tablet Take 1 tablet by mouth daily.    [provider]  cinacalcet (SENSIPAR) 30 MG tablet Take 30 mg by mouth daily with supper. 12/23/17   [provider]  clindamycin (CLEOCIN T) 1 % external solution Apply 1 application topically 2 (two) times daily. Apply to face.    [provider]  Dextromethorphan-Guaifenesin (MUCINEX DM) 30-600 MG TB12 Take 1 tablet by mouth daily as needed (congestion).     [provider]  docusate sodium (COLACE) 100 MG capsule Take 100 mg by mouth daily as needed for mild constipation.     [provider]  gabapentin (NEURONTIN) 100 MG capsule Take 2 capsules (200 mg total) by mouth at bedtime. 200 mg qam and 300 mg upon return from dialysis on M-W-F for neuropathy Patient taking differently: Take 100 mg by mouth 2 (two) times daily.  07/31/16   Medina-Vargas, Monina C, NP  HYDROcodone-acetaminophen (NORCO/VICODIN) 5-325 MG tablet Take 1-2  tablets by mouth every 6 (six) hours as needed. 02/10/19   Malvin Johns, MD  lactulose (CHRONULAC) 10 GM/15ML solution Take 20 g by mouth 2 (two) times daily as needed for mild constipation.    [provider]  nystatin cream (MYCOSTATIN) Apply 1 application topically 2 (two) times daily as needed (rash).    [provider]  simvastatin (ZOCOR) 20 MG tablet Take 1 tablet (20 mg total) by mouth daily. Patient taking differently: Take 20 mg by mouth at bedtime.  07/31/16   Medina-Vargas, Monina C, NP  sodium chloride (OCEAN) 0.65 % SOLN nasal spray Place 1 spray into both nostrils as needed for congestion. 01/31/14   Ghimire, Henreitta Leber, MD  tretinoin (RETIN-A) 0.05 % cream Apply 1 application topically at bedtime. Apply to face    [provider]    Allergies    Tobacco [tobacco] and Other  Review of Systems   Review of Systems  All other systems reviewed and are negative.   Physical Exam Updated Vital Signs BP 118/77 (BP Location: Right Arm)   Pulse 83   Temp 98.2 F (36.8 C) (Oral)   Resp (!) 21   Ht 5\' 8"  (1.727 m)   Wt 117.9 kg   SpO2 98%   BMI 39.53 kg/m   Physical Exam Vitals and nursing note reviewed.  Constitutional:      General: He is not in acute distress.    Appearance: He is well-developed. He is not diaphoretic.  HENT:     Head: Normocephalic and atraumatic.  Cardiovascular:     Rate and Rhythm: Normal rate and regular rhythm.     Heart sounds: No murmur heard.  No friction rub.  Pulmonary:     Effort: Pulmonary effort is normal. No respiratory distress.     Breath sounds: Normal breath sounds. No wheezing or rales.  Abdominal:     General: Bowel sounds are normal. There is no distension.     Palpations: Abdomen is soft.     Tenderness: There is no abdominal tenderness.  Musculoskeletal:        General: Normal range of motion.     Cervical back: Normal range of motion and neck supple.  Skin:    General: Skin is warm and dry.   Neurological:     Mental Status:  He is alert and oriented to person, place, and time.     Coordination: Coordination normal.     ED Results / Procedures / Treatments   Labs (all labs ordered are listed, but only abnormal results are displayed) Labs Reviewed  I-STAT CHEM 8, ED - Abnormal; Notable for the following components:      Result Value   Sodium 126 (*)    Potassium 8.3 (*)    BUN 113 (*)    Creatinine, Ser 16.10 (*)    Glucose, Bld 105 (*)    Calcium, Ion 0.90 (*)    TCO2 17 (*)    Hemoglobin 11.6 (*)    HCT 34.0 (*)    All other components within normal limits  RESP PANEL BY RT-PCR (FLU A&B, COVID) ARPGX2  COMPREHENSIVE METABOLIC PANEL  CBC WITH DIFFERENTIAL/PLATELET  CBG MONITORING, ED  TROPONIN I (HIGH SENSITIVITY)    EKG EKG Interpretation  Date/Time:  Monday January 11 2020 07:37:59 EST Ventricular Rate:  79 PR Interval:    QRS Duration: 234 QT Interval:  507 QTC Calculation: 582 R Axis:   -115 Text Interpretation: Atrial fibrillation Right bundle branch block Confirmed by Veryl Speak [phone]) on 01/11/2020 7:43:00 AM   Radiology No results found.  Procedures Procedures (including critical care time)  Medications Ordered in ED Medications  calcium gluconate 1 g/ 50 mL sodium chloride IVPB (has no administration in time range)  dextrose 50 % solution 50 mL (has no administration in time range)  insulin aspart (novoLOG) injection 10 Units (has no administration in time range)    ED Course  I have reviewed the triage vital signs and the nursing notes.  Pertinent labs & imaging results that were available during my care of the patient were reviewed by me and considered in my medical decision making (see chart for details).    MDM Rules/Calculators/A&P  Patient is a 64 year old male with history of end-stage renal disease on hemodialysis.  Patient presents with weakness and diarrhea after not having had dialysis in 6 days.  Patient apparently  did not go during the Thanksgiving holiday.  Patient arrives here hemodynamically stable, but EKG shows widening of his QRS complex concerning for hyperkalemia.  IV access was established and laboratory studies were obtained.  I-STAT Chem-8 showed a potassium of 8.3.  Patient given glucose/insulin along with calcium.  Care discussed with Dr. [name] from nephrology who would also like albuterol and bicarb.  These were ordered as well.  Shortly after receiving the above medications, the patient's QRS complexes begin to narrow.  Covid test is negative.  He is now awaiting dialysis.  Care discussed with Dr. [name] from the hospitalist service who agrees to admit.  CRITICAL CARE Performed by: Veryl Speak Total critical care time: 45 minutes Critical care time was exclusive of separately billable procedures and treating other patients. Critical care was necessary to treat or prevent imminent or life-threatening deterioration. Critical care was time spent personally by me on the following activities: development of treatment plan with patient and/or surrogate as well as nursing, discussions with consultants, evaluation of patient's response to treatment, examination of patient, obtaining history from patient or surrogate, ordering and performing treatments and interventions, ordering and review of laboratory studies, ordering and review of radiographic studies, pulse oximetry and re-evaluation of patient's condition.   Final Clinical Impression(s) / ED Diagnoses Final diagnoses:  None    Rx / DC Orders ED Discharge Orders    None  Veryl Speak, MD 01/11/20 [phone]

## 2020-01-11 NOTE — ED Notes (Signed)
Pt more alert requesting to have CPAP removed. Report given to HD RN.

## 2020-01-11 NOTE — ED Notes (Signed)
Pt provided fresh brief, linens & warm blankets. MD paged regarding pt's request for diet order

## 2020-01-11 NOTE — ED Triage Notes (Signed)
Here via [facility] from home for generalized weakness and diarrhea for several days.  Dialysis MWF: pt missed dialysis on Fri d/t not feeling well.    HR 80 afib bp 142/70 rr 16 T 97.7 SPO2 94% RA cbg 150

## 2020-01-11 NOTE — Consult Note (Signed)
Renal Service Consult Note [facility]  Daniel Whitney 01/11/2020 [name], MD Requesting Physician: Dr [name], Lenna [facility]ara.   Reason for Consult: ESRD pt w/ severe hyperkalemia HPI: The patient is a 64 y.o. year-old w/ hx of atrial fib, DM2, ESRD on HD, gout, HL, HTN, OSA, hx MI/ CAD, hx CVA presenting to the ED w/ hx of gen weakness and diarrhea, missed HD x 1-2 last week.  In ED HR 80's , wide QRS, BP 140/70, 94% on RA.  K+ 8.3.  Pt rec'd all acute temporizing ^K+ measures in ED. Asked to see for dialysis.   Pt confused, getting alb nebs now.  Tele tracing has improved to narrow QRS now, HR 80's.  Pt can't give any history.    Past Medical History  Past Medical History:  Diagnosis Date  . Allergic rhinitis 07/07/2014  . Anemia due to other cause 07/07/2014  . Arthritis    "all over"  . Atrial fibrillation ([place]) 12/30/2012  . CHF (congestive heart failure) ([place]) 12/30/2012  . Chronic lower back pain   . Diabetes mellitus with neuropathy ([place])   . Diabetic retinopathy ([place]) 12/30/2012   no medications  . Dysrhythmia    Afib  . ESRD (end stage renal disease) on dialysis ([place]) started in 2013   MWF; [facility]; [employer]  . GERD (gastroesophageal reflux disease)   . Glaucoma   . History of gout    "right big toe"  . Hyperlipidemia   . Hypertension   . Hypothyroid    01/17/16- no longer on meduication  . Morbid obesity ([place]) 12/30/2012  . Myocardial infarction ([place])    "I've had ~ 3; last one was in ~ 10/2013" (01/30/2014)  . OSA (obstructive sleep apnea) 12/30/2012   "lost weight; no longer needed CPAP; retested said I needed it; didn't followup cause I was feeling fine" (01/30/2014)  . Pulmonary hypertension ([place]) 12/30/2012  . Refusal of blood transfusions as patient is Jehovah's Witness   . Stroke [facility]) ~ 2005; ~ 2005   "they were mild; I didn't even notice I'd had them"; denies residual on 01/30/2014  . Subdural hematoma (Oct 2018)    Coumadin and  heparin placed on hold at dc- pt did not FU w/ NS as recommended after dc so not on warfarin when admitted 04/03/17   Past Surgical History  Past Surgical History:  Procedure Laterality Date  . AV FISTULA PLACEMENT Left ~ 10/2013   "forearm"  . CARDIAC CATHETERIZATION N/A 01/07/2015   Procedure: Left Heart Cath and Coronary Angiography;  Surgeon: Troy Sine, MD;  Location: [facility];  Service: Cardiovascular;  Laterality: N/A;  . CLOSED REDUCTION HIP DISLOCATION Right 1970's  . JOINT REPLACEMENT    . TOTAL HIP ARTHROPLASTY Right 1980  . TOTAL KNEE ARTHROPLASTY Right 01/24/2016   Procedure: TOTAL KNEE ARTHROPLASTY;  Surgeon: Dorna Leitz, MD;  Location: [facility];  Service: Orthopedics;  Laterality: Right;  . TOTAL KNEE ARTHROPLASTY Left 06/26/2016   Procedure: TOTAL KNEE ARTHROPLASTY;  Surgeon: Dorna Leitz, MD;  [facility];  Service: Orthopedics;  Laterality: Left;   Family History  Family History  Problem Relation Age of Onset  . Arthritis Other   . Heart disease Other   . Hyperlipidemia Other   . Hypertension Other   . Kidney disease Other   . Diabetes Other   . Stroke Other   . Diabetes Mother   . Cancer Father        bone  cancer  . Heart disease Father   . Diabetes Sister   . Diabetes Brother   . Stroke Brother    Social History  reports that he has never smoked. He has never used smokeless tobacco. He reports current alcohol use of about 7.0 - 8.0 standard drinks of alcohol per week. He reports that he does not use drugs. Allergies  Allergies  Allergen Reactions  . Tobacco [Tobacco] Shortness Of Breath  . Other     Perfume & cologne-eyes itchy   Home medications Prior to Admission medications   Medication Sig Start Date End Date Taking? Authorizing Provider  allopurinol (ZYLOPRIM) 100 MG tablet Take 1 tablet (100 mg total) by mouth 2 (two) times daily. 07/31/16   Medina-Vargas, Monina C, NP  amitriptyline (ELAVIL) 50 MG tablet Take 1 tablet (50 mg total)  by mouth at bedtime. 02/28/18   Rai, Ripudeep K, MD  b complex-vitamin c-folic acid (NEPHRO-VITE) 0.8 MG TABS tablet Take 1 tablet by mouth daily.    [provider]  cinacalcet (SENSIPAR) 30 MG tablet Take 30 mg by mouth daily with supper. 12/23/17   [provider]  clindamycin (CLEOCIN T) 1 % external solution Apply 1 application topically 2 (two) times daily. Apply to face.    [provider]  Dextromethorphan-Guaifenesin (MUCINEX DM) 30-600 MG TB12 Take 1 tablet by mouth daily as needed (congestion).     [provider]  docusate sodium (COLACE) 100 MG capsule Take 100 mg by mouth daily as needed for mild constipation.     [provider]  gabapentin (NEURONTIN) 100 MG capsule Take 2 capsules (200 mg total) by mouth at bedtime. 200 mg qam and 300 mg upon return from dialysis on M-W-F for neuropathy Patient taking differently: Take 100 mg by mouth 2 (two) times daily.  07/31/16   Medina-Vargas, Monina C, NP  HYDROcodone-acetaminophen (NORCO/VICODIN) 5-325 MG tablet Take 1-2 tablets by mouth every 6 (six) hours as needed. 02/10/19   Malvin Johns, MD  lactulose (CHRONULAC) 10 GM/15ML solution Take 20 g by mouth 2 (two) times daily as needed for mild constipation.    [provider]  nystatin cream (MYCOSTATIN) Apply 1 application topically 2 (two) times daily as needed (rash).    [provider]  simvastatin (ZOCOR) 20 MG tablet Take 1 tablet (20 mg total) by mouth daily. Patient taking differently: Take 20 mg by mouth at bedtime.  07/31/16   Medina-Vargas, Monina C, NP  sodium chloride (OCEAN) 0.65 % SOLN nasal spray Place 1 spray into both nostrils as needed for congestion. 01/31/14   Ghimire, Henreitta Leber, MD  tretinoin (RETIN-A) 0.05 % cream Apply 1 application topically at bedtime. Apply to face    [provider]     Vitals:   01/11/20 0854 01/11/20 0900 01/11/20 0915 01/11/20 0930  BP: (!) 141/69 (!) 126/59 140/64 (!)  136/52  Pulse: 85     Resp: 16 16 18 17   Temp:      TempSrc:      SpO2: 100%     Weight:      Height:       Exam Gen lethargic, confused, responds to simple commands No rash, cyanosis or gangrene Sclera anicteric, throat clear  No jvd or bruits Chest mild exp wheezing, no rales/ rhonchi RRR no MRG Abd soft ntnd no mass or ascites +bs obese GU normal male MS no joint effusions or deformity Ext 1+ pretib / ankle edema, no wounds or ulcer Neuro  is as above, nonfocal, moving all ext    Home meds:  - neurontin 100 bid/ norco prn/ elavil 50 hs  - zocor 20 qd  - zyloprim 100 bid  - sensipar 20 hs  - prn's/ vitamins/ supplements    [facility] HD[facility]  4h 70min   104.5kg  2K/3.5 bath  450/800   Hep 5000  AVF LUA  - mirc 1000 last 11/21, due 12/4   Assessment/ Plan: 1. Severe hyperkalemia - w/ EKG changes, improving w/ ED measures. Plan acute HD asap upstairs as soon as possible.  2. ESRD - on HD MWF.  Missed 1-2 HD sessions , he says due to diarrhea.  3. BP/ volume - mild LE edema, CXR neg for edema. Moderate UF w/ hd this am.  4. Hx SDH/ CVA 5. Atrial fib - not on AC 6. DM2 7. Chronic pain syndrome      Kelly Splinter  MD 01/11/2020, 9:49 AM  Recent Labs  Lab 01/11/20 0744 01/11/20 0801  WBC 8.4  --   HGB 10.1* 11.6*   Recent Labs  Lab 01/11/20 0744 01/11/20 0801  K >7.5* 8.3*  BUN 106* 113*  CREATININE 14.62* 16.10*  CALCIUM 8.8*  --

## 2020-01-11 NOTE — Progress Notes (Signed)
CRITICAL VALUE ALERT  Critical Value:  Potassium 6.9  Date & Time Notied:  5643  Provider Notified: MD aware   Orders Received/Actions taken: Run on 0 k bath for 45 min  1 K bath for 45 min

## 2020-01-11 NOTE — ED Notes (Signed)
Dialysis can not accept pt until around noon, all bays are full at this time

## 2020-01-11 NOTE — ED Notes (Signed)
MD. Stark Jock notified BP 86/40

## 2020-01-11 NOTE — ED Notes (Signed)
Pt given [place] sandwich & water

## 2020-01-11 NOTE — H&P (Addendum)
History and Physical    Daniel Whitney OIB:[id number] DOB: 1955/08/07 DOA: 01/11/2020  PCP: [name], MD Consultants:  [facility] - nephrology Patient coming from: Home - lives alone; NOKRichmond Whitney [phone], [phone]   Chief Complaint: Weakness, diarrhea, missed HD  HPI: Daniel Whitney is a 64 y.o. male with medical history significant of SDH (2018); CVA; Jehovah's Witness; pulmonary HTN; OSA not on CPAP; CAD; morbid obesity; hypothyroidism; HTN; HLD; ESRD on MWF HD; afib not on AC; and DM presenting with weakness, diarrhea, missed HD.  Reportedly, he decided to miss HD in order to spend time with family for Thanksgiving, and they ended up having to be in quarantine due to COVID infection and so he was unable to see them.    ED Course:  ESRD without HD since Tuesday.  Here with weakness, diarrhea.  QRS complexes WIDE, K+ 8.3.  HD today.  Given HCO3, glucose/insulin, calcium gluconate, albuterol - repeat EKG better.   Review of Systems: Unable to perform   Past Medical History:  Diagnosis Date  . Allergic rhinitis 07/07/2014  . Anemia due to other cause 07/07/2014  . Arthritis    "all over"  . Atrial fibrillation ([place]) 12/30/2012  . CHF (congestive heart failure) ([place]) 12/30/2012  . Chronic lower back pain   . Diabetes mellitus with neuropathy ([place])   . Diabetic retinopathy ([place]) 12/30/2012   no medications  . Dysrhythmia    Afib  . ESRD (end stage renal disease) on dialysis ([place]) started in 2013   MWF; [facility]; [employer]  . GERD (gastroesophageal reflux disease)   . Glaucoma   . History of gout    "right big toe"  . Hyperlipidemia   . Hypertension   . Hypothyroid    01/17/16- no longer on meduication  . Morbid obesity ([place]) 12/30/2012  . Myocardial infarction ([place])    "I've had ~ 3; last one was in ~ 10/2013" (01/30/2014)  . OSA (obstructive sleep apnea) 12/30/2012   "lost weight; no longer needed CPAP; retested said I needed it; didn't  followup cause I was feeling fine" (01/30/2014)  . Pulmonary hypertension ([place]) 12/30/2012  . Refusal of blood transfusions as patient is Jehovah's Witness   . Stroke [facility]) ~ 2005; ~ 2005   "they were mild; I didn't even notice I'd had them"; denies residual on 01/30/2014  . Subdural hematoma (Oct 2018)    Coumadin and heparin placed on hold at dc- pt did not FU w/ NS as recommended after dc so not on warfarin when admitted 04/03/17    Past Surgical History:  Procedure Laterality Date  . AV FISTULA PLACEMENT Left ~ 10/2013   "forearm"  . CARDIAC CATHETERIZATION N/A 01/07/2015   Procedure: Left Heart Cath and Coronary Angiography;  Surgeon: Troy Sine, MD;  Location: [place] CV [facility];  Service: Cardiovascular;  Laterality: N/A;  . CLOSED REDUCTION HIP DISLOCATION Right 1970's  . JOINT REPLACEMENT    . TOTAL HIP ARTHROPLASTY Right 1980  . TOTAL KNEE ARTHROPLASTY Right 01/24/2016   Procedure: TOTAL KNEE ARTHROPLASTY;  Surgeon: Dorna Leitz, MD;  Location: [place];  Service: Orthopedics;  Laterality: Right;  . TOTAL KNEE ARTHROPLASTY Left 06/26/2016   Procedure: TOTAL KNEE ARTHROPLASTY;  Surgeon: Dorna Leitz, MD;  Location: [place];  Service: Orthopedics;  Laterality: Left;    Social History   Socioeconomic History  . Marital status: Single    Spouse name: Not on file  . Number of children: 2  . Years  of education: Not on file  . Highest education level: Not on file  Occupational History  . Not on file  Tobacco Use  . Smoking status: Never Smoker  . Smokeless tobacco: Never Used  Vaping Use  . Vaping Use: Never used  Substance and Sexual Activity  . Alcohol use: Yes    Alcohol/week: 7.0 - 8.0 standard drinks    Types: 1 Cans of beer, 3 Shots of liquor, 3 - 4 Standard drinks or equivalent per week    Comment: 07/03/16 1 pint 2-3X/week  . Drug use: No  . Sexual activity: Not Currently  Other Topics Concern  . Not on file  Social History Narrative  . Not on file   Social  Determinants of Health   Financial Resource Strain:   . Difficulty of Paying Living Expenses: Not on file  Food Insecurity:   . Worried About Charity fundraiser in the Last Year: Not on file  . Ran Out of Food in the Last Year: Not on file  Transportation Needs: No Transportation Needs  . Lack of Transportation (Medical): No  . Lack of Transportation (Non-Medical): No  Physical Activity:   . Days of Exercise per Week: Not on file  . Minutes of Exercise per Session: Not on file  Stress:   . Feeling of Stress : Not on file  Social Connections:   . Frequency of Communication with Friends and Family: Not on file  . Frequency of Social Gatherings with Friends and Family: Not on file  . Attends Religious Services: Not on file  . Active Member of Clubs or Organizations: Not on file  . Attends Archivist Meetings: Not on file  . Marital Status: Not on file  Intimate Partner Violence:   . Fear of Current or Ex-Partner: Not on file  . Emotionally Abused: Not on file  . Physically Abused: Not on file  . Sexually Abused: Not on file    Allergies  Allergen Reactions  . Tobacco [Tobacco] Shortness Of Breath  . Other     Perfume & cologne-eyes itchy    Family History  Problem Relation Age of Onset  . Arthritis Other   . Heart disease Other   . Hyperlipidemia Other   . Hypertension Other   . Kidney disease Other   . Diabetes Other   . Stroke Other   . Diabetes Mother   . Cancer Father        bone cancer  . Heart disease Father   . Diabetes Sister   . Diabetes Brother   . Stroke Brother     Prior to Admission medications   Medication Sig Start Date End Date Taking? Authorizing Provider  allopurinol (ZYLOPRIM) 100 MG tablet Take 1 tablet (100 mg total) by mouth 2 (two) times daily. 07/31/16   Medina-Vargas, Monina C, NP  amitriptyline (ELAVIL) 50 MG tablet Take 1 tablet (50 mg total) by mouth at bedtime. 02/28/18   Rai, Ripudeep K, MD  b complex-vitamin c-folic acid  (NEPHRO-VITE) 0.8 MG TABS tablet Take 1 tablet by mouth daily.    [provider]  cinacalcet (SENSIPAR) 30 MG tablet Take 30 mg by mouth daily with supper. 12/23/17   [provider]  clindamycin (CLEOCIN T) 1 % external solution Apply 1 application topically 2 (two) times daily. Apply to face.    [provider]  Dextromethorphan-Guaifenesin (MUCINEX DM) 30-600 MG TB12 Take 1 tablet by mouth daily as needed (congestion).  [provider]  docusate sodium (COLACE) 100 MG capsule Take 100 mg by mouth daily as needed for mild constipation.     [provider]  gabapentin (NEURONTIN) 100 MG capsule Take 2 capsules (200 mg total) by mouth at bedtime. 200 mg qam and 300 mg upon return from dialysis on M-W-F for neuropathy Patient taking differently: Take 100 mg by mouth 2 (two) times daily.  07/31/16   Medina-Vargas, Monina C, NP  HYDROcodone-acetaminophen (NORCO/VICODIN) 5-325 MG tablet Take 1-2 tablets by mouth every 6 (six) hours as needed. 02/10/19   Malvin Johns, MD  lactulose (CHRONULAC) 10 GM/15ML solution Take 20 g by mouth 2 (two) times daily as needed for mild constipation.    [provider]  nystatin cream (MYCOSTATIN) Apply 1 application topically 2 (two) times daily as needed (rash).    [provider]  simvastatin (ZOCOR) 20 MG tablet Take 1 tablet (20 mg total) by mouth daily. Patient taking differently: Take 20 mg by mouth at bedtime.  07/31/16   Medina-Vargas, Monina C, NP  sodium chloride (OCEAN) 0.65 % SOLN nasal spray Place 1 spray into both nostrils as needed for congestion. 01/31/14   Ghimire, Henreitta Leber, MD  tretinoin (RETIN-A) 0.05 % cream Apply 1 application topically at bedtime. Apply to face    [provider]    Physical Exam: Vitals:   01/11/20 1215 01/11/20 1230 01/11/20 1245 01/11/20 1303  BP: (!) 130/55 (!) 126/51 (!) 123/48 131/65  Pulse: 81 79 92 89  Resp:   (!) 23 (!) 21  Temp:    (!) 97.4  F (36.3 C)  TempSrc:      SpO2: 99% 97% 92% 92%  Weight:      Height:         . General:  Appears extremely somnolent to obtunded with increased WOB  . Eyes:   normal lids, iris, eyes mostly closed throughout  ENT:  Hard of hearing, grossly normal lips & tongue, mmm  Neck:  no LAD, masses or thyromegaly  Cardiovascular:  RRR, no m/r/g. No LE edema. Pacer pads in place due to markedly widened QRS complexes and severe hyperkalemia on admission, despite hemodynamic stability at this time.  Respiratory:   CTA bilaterally with no wheezes/rales/rhonchi.  Mildly increased respiratory effort.  Abdomen:  soft, NT, ND, NABS  Skin:  no rash or induration seen on limited exam  Musculoskeletal:  grossly normal tone BUE/BLE, no bony abnormality  Psychiatric:  Quite somnolent, briefly wakes up to answer questions and then lapses back to sleep - unable to provide effective history   Neurologic:  unable to perform    Radiological Exams on Admission: Independently reviewed - see discussion in A/P where applicable  DG Chest Port 1 View  Result Date: 01/11/2020 CLINICAL DATA:  Weakness, diarrhea and productive cough for several days. Patient has dialysis on Monday, Wednesday and Friday, missed dialysis on Friday. EXAM: PORTABLE CHEST 1 VIEW COMPARISON:  02/15/2019 FINDINGS: Mild enlargement of the cardiopericardial silhouette, stable. No mediastinal or masses. Lungs are clear. No convincing pleural effusion and no pneumothorax. Skeletal structures are grossly intact IMPRESSION: No acute cardiopulmonary disease. Electronically Signed   By: Lajean Manes M.D.   On: 01/11/2020 08:28    EKG: Independently reviewed.   4098 - Afib with rate 79; RBBB with WIDE QRS complexes, QTc 582 0842 - Afib with rate 88, ICD with normalization of widened QRS complexes; QTc 453  Labs on Admission: I have personally reviewed the available  labs and imaging studies at the time of the admission.  Pertinent labs:    Na++ 129 K+ 8.3 CO2 1 Glucose 116 BN 106/Creatinine 14.62/GFR 3 Anion gap 21 AP 157 HS troponin 47 WBC 8.4 Hgb 10.1 COVID/flu negative   Assessment/Plan Principal Problem:   Noncompliance of patient with renal dialysis ([place]) Active Problems:   Diabetes mellitus with neuropathy (HCC)   Hypertension   AF (paroxysmal atrial fibrillation) (HCC)   Morbid obesity (HCC)   OSA (obstructive sleep apnea)- on C-pap   Hypothyroid   Prediabetes-HgbA1c 6.2   Hyperkalemia, diminished renal excretion   Severe, life-threatening hyperkalemia -Patient with acute/subacute hyperkalemia which appears to be resulting from missed HD and was life-threatening on admission -EKG with severe wide complex tachycardia, pacer pads placed -He was treated emergently with Albuterol continuous neb; calcium gluconate; insulin/glucose; and sodium bicarbonate -His telemetry/EKG abnormalities stabilized and then he was taken upstairs for emergent HD -Will continue to closely monitor on telemetry in Progressive Care Unit -Follow-up BMP after HD -Markedly increased QTc on arrival, but this normalized by the time of the repeat EKG  ESRD with non-compliance -Patient on chronic MWF HD -Nephrology prn order set utilized -He missed HD for the last week, which appears to have led to life-threatening complications -The vast majority of his issues will likely be improved/resolved with HD today and possibly also serially -Nephrology is consulting and has ordered HD -Ongoing counseling encouraged about the importance of attending HD -Continue nephrovite, Sensipar -He appears to be very uremic and this is thought to be the cause of his excessive somnolence/snoring; anticipate improvement after HD  HTN -He is no longer taking BP medications  Aflutter -He is not on rate-controlling agents and is now back to good rate control -No AC given prior h/o SDH  DM -His last A1c was 6.2 in 2018 -He is diet controlled   -Appears to have ongoing good control, no SSI ordered  Chronic pain -Continue Neurontin -I have reviewed this patient in the [place] Controlled Substances Reporting System.  He is not receiving chronic medications -He is not at particularly high risk of opioid misuse, diversion, or overdose.  HLD -Continue Zocor  Hypothyroidism -reported to have this issue but not on medications -Last thyroid testing was in 1/21 and was normal  Obesity -Body mass index is 39.53 kg/m..  -Weight loss should be encouraged -Outpatient PCP/bariatric medicine f/u encouraged    Note: This patient has been tested and is negative for the novel coronavirus COVID-19.   Total critical care time: 43minutes Critical care time was exclusive of separately billable procedures and treating other patients. Critical care was necessary to treat or prevent imminent or life-threatening deterioration. Critical care was time spent personally by me on the following activities: development of treatment plan with patient and/or surrogate as well as nursing, discussions with consultants, evaluation of patient's response to treatment, examination of patient, obtaining history from patient or surrogate, ordering and performing treatments and interventions, ordering and review of laboratory studies, ordering and review of radiographic studies, pulse oximetry and re-evaluation of patient's condition.    DVT prophylaxis: Heparin Code Status:  Full - based on prior admissions Family Communication: None present; I was unable to reach either of his brothers by telephone at the time of admission Disposition Plan:  Home once clinically improved Consults called: Nephrology  Admission status: Admit - It is my clinical opinion that admission to INPATIENT is reasonable and necessary because of the expectation that this patient will require hospital  care that crosses at least 2 midnights to treat this condition based on the medical  complexity of the problems presented.  Given the aforementioned information, the predictability of an adverse outcome is felt to be significant.  Karmen Bongo MD [facility]   How to contact the [facility] Attending or Consulting provider [place] or covering provider during after hours [place], for this patient?  1. Check the care team in [facility] and look for a) attending/consulting TRH provider listed and b) the [facility] team listed 2. Log into [email or web address] and use [place]'s universal password to access. If you do not have the password, please contact the hospital operator. 3. Locate the [facility] provider you are looking for under [facility] and page to a number that you can be directly reached. 4. If you still have difficulty reaching the provider, please page the [facility] (Director on Call) for the Hospitalists listed on amion for assistance.   01/11/2020, 2:12 PM

## 2020-01-12 ENCOUNTER — Other Ambulatory Visit: Payer: Self-pay

## 2020-01-12 DIAGNOSIS — E1129 Type 2 diabetes mellitus with other diabetic kidney complication: Secondary | ICD-10-CM | POA: Diagnosis not present

## 2020-01-12 DIAGNOSIS — Z992 Dependence on renal dialysis: Secondary | ICD-10-CM | POA: Diagnosis not present

## 2020-01-12 DIAGNOSIS — Z9115 Patient's noncompliance with renal dialysis: Secondary | ICD-10-CM | POA: Diagnosis not present

## 2020-01-12 DIAGNOSIS — E875 Hyperkalemia: Secondary | ICD-10-CM | POA: Diagnosis not present

## 2020-01-12 DIAGNOSIS — R4182 Altered mental status, unspecified: Secondary | ICD-10-CM | POA: Diagnosis not present

## 2020-01-12 DIAGNOSIS — I120 Hypertensive chronic kidney disease with stage 5 chronic kidney disease or end stage renal disease: Secondary | ICD-10-CM | POA: Diagnosis not present

## 2020-01-12 DIAGNOSIS — N186 End stage renal disease: Secondary | ICD-10-CM | POA: Diagnosis not present

## 2020-01-12 DIAGNOSIS — R531 Weakness: Secondary | ICD-10-CM | POA: Diagnosis not present

## 2020-01-12 DIAGNOSIS — D649 Anemia, unspecified: Secondary | ICD-10-CM | POA: Diagnosis not present

## 2020-01-12 LAB — CBC
HCT: 28.7 % — ABNORMAL LOW (ref 39.0–52.0)
Hemoglobin: 9.2 g/dL — ABNORMAL LOW (ref 13.0–17.0)
MCH: 29.6 pg (ref 26.0–34.0)
MCHC: 32.1 g/dL (ref 30.0–36.0)
MCV: 92.3 fL (ref 80.0–100.0)
Platelets: 153 10*3/uL (ref 150–400)
RBC: 3.11 MIL/uL — ABNORMAL LOW (ref 4.22–5.81)
RDW: 17.2 % — ABNORMAL HIGH (ref 11.5–15.5)
WBC: 6.5 10*3/uL (ref 4.0–10.5)
nRBC: 0 % (ref 0.0–0.2)

## 2020-01-12 LAB — BASIC METABOLIC PANEL
Anion gap: 16 — ABNORMAL HIGH (ref 5–15)
BUN: 43 mg/dL — ABNORMAL HIGH (ref 8–23)
CO2: 26 mmol/L (ref 22–32)
Calcium: 8.3 mg/dL — ABNORMAL LOW (ref 8.9–10.3)
Chloride: 91 mmol/L — ABNORMAL LOW (ref 98–111)
Creatinine, Ser: 8.3 mg/dL — ABNORMAL HIGH (ref 0.61–1.24)
GFR, Estimated: 7 mL/min — ABNORMAL LOW (ref >60)
Glucose, Bld: 103 mg/dL — ABNORMAL HIGH (ref 70–99)
Potassium: 5 mmol/L (ref 3.5–5.1)
Sodium: 133 mmol/L — ABNORMAL LOW (ref 135–145)

## 2020-01-12 LAB — MRSA PCR SCREENING: MRSA by PCR: NEGATIVE

## 2020-01-12 MED ORDER — NEPRO/CARBSTEADY PO LIQD
237.0000 mL | Freq: Two times a day (BID) | ORAL | Status: DC
  Administered 2020-01-12 (×2): 237 mL via ORAL

## 2020-01-12 MED ORDER — SIMVASTATIN 20 MG PO TABS: 20.0000 mg | ORAL_TABLET | Freq: Every day | ORAL | 0 refills | Status: AC

## 2020-01-12 MED ORDER — INFLUENZA VAC A&B SA ADJ QUAD 0.5 ML IM PRSY
0.5000 mL | PREFILLED_SYRINGE | INTRAMUSCULAR | Status: AC | PRN
  Administered 2020-01-12: 0.5 mL via INTRAMUSCULAR
  Filled 2020-01-12: qty 0.5

## 2020-01-12 NOTE — Progress Notes (Signed)
[facility] Progress Note  Subjective: looks so much better today. No c/o's, O x 3  Vitals:   01/11/20 2052 01/11/20 2137 01/11/20 2349 01/12/20 0437  BP: 136/67 105/65 118/60 123/66  Pulse: 78 77 74 80  Resp: (!) 23 20 17 20   Temp: 98.2 F (36.8 C) 97.8 F (36.6 C) 98 F (36.7 C) 98.1 F (36.7 C)  TempSrc: Oral Oral Oral Oral  SpO2: 98% 100% 100% 100%  Weight:  105.7 kg    Height:  5\' 9"  (1.753 m)      Exam:  alert, nad   no jvd  Chest cta bilat  Cor reg no RG  Abd soft ntnd no ascites   Ext no LE edema   Alert, NF, ox3   LUA AVF+bruit    Home meds:  - neurontin 100 bid/ norco prn/ elavil 50 hs  - zocor 20 qd  - zyloprim 100 bid  - sensipar 20 hs  - prn's/ vitamins/ supplements    OP HD: [facility]WF [place]  4h 74min   104.5kg  2K/3.5 bath  450/800   Hep 5000  AVF LUA  - mirc 1000 last 11/21, due 12/4   Assessment/ Plan: 1. Severe hyperkalemia - w/ EKG changes, improved w/ ED measures. Resolved w/ HD yesterday. K+ 5.0 today. Can go home, have d/w pmd.  2. ESRD - on HD MWF.  Missed 1-2 HD sessions due to diarrhea. Discussed importance on not missing HD.  3. BP/ volume - admit CXR neg. 1 kg up today. Stable.  4. Hx SDH/ CVA 5. Atrial fib - not on AC 6. DM2 7. Chronic pain syndrome 8. Dispo - stable for d/c from renal standpoint      Rob Krysti Hickling 01/12/2020, 3:06 PM   Recent Labs  Lab 01/11/20 0801 01/11/20 1406 01/11/20 1407 01/12/20 0231  K   < >  --  6.9* 5.0  BUN   < >  --  109* 43*  CREATININE   < >  --  15.31* 8.30*  CALCIUM   < >  --  8.8* 8.3*  PHOS  --   --  10.2*  --   HGB  --  9.4*  --  9.2*   < > = values in this interval not displayed.   Inpatient medications: . amitriptyline  50 mg Oral QHS  . Chlorhexidine Gluconate Cloth  6 each Topical Q0600  . cinacalcet  30 mg Oral Q supper  . feeding supplement (NEPRO CARB STEADY)  237 mL Oral BID BM  . gabapentin  100 mg Oral BID  . heparin  3,000 Units Dialysis Once in  dialysis  . heparin  5,000 Units Subcutaneous Q8H  . multivitamin  1 tablet Oral QHS  . simvastatin  20 mg Oral QHS  . sodium chloride flush  3 mL Intravenous Q12H    acetaminophen **OR** acetaminophen, albuterol, calcium carbonate (dosed in mg elemental calcium), camphor-menthol **AND** hydrOXYzine, docusate sodium, feeding supplement (NEPRO CARB STEADY), hydrALAZINE, ondansetron **OR** ondansetron (ZOFRAN) IV, sorbitol, zolpidem

## 2020-01-12 NOTE — TOC Transition Note (Addendum)
Transition of Care (TOC) - CM/SW Discharge Note Marvetta Gibbons RN, BSN Transitions of Care Unit 4E- RN Case Manager See Treatment Team for direct phone # Cross Coverage for [place]   Patient Details  Name: Daniel Whitney MRN: [id number] Date of Birth: [birth year]  Transition of Care [facility]) CM/SW Contact:  [name], RN Phone Number: 01/12/2020, 1:28 PM   Clinical Narrative:    Pt stable for transition home today, orders placed for [facility] and DME needs- CM spoke to pt at  Bedside - per pt he is thinking of going to stay with daughter but not sure yet is open to [facility].- states he has used [facility] in past and would use them again. Also requesting a rollator for home.   Address, phone # and PCP all confirmed with pt in epic.   Call made to [employer] [facility] line for DME referral- rollator to be delivered to room prior to discharge.   Call made to [facility] for HHPT/OT referral- pending return call for confirmation.  1400- [place] return call- unable to accept-due to staffing- call made to pt for alternate choice- pt selected [facility] and if not then [facility] Call made to [facility] who is able to accept referral for PT/OT   Final next level of care: [place] Barriers to Discharge: No Barriers Identified   Patient Goals and CMS Choice Patient states their goals for this hospitalization and ongoing recovery are:: return home, CMS Medicare.gov Compare Post Acute Care list provided to:: Patient Choice offered to / list presented to : Patient  Discharge Placement                   Home with [facility]    Discharge Plan and Services   Discharge Planning Services: CM Consult Post Acute Care Choice: Durable Medical Equipment, Home Health          DME Arranged: [place] rolling with seat DME Agency: [facility] Date DME Agency Contacted: 01/12/20 Time DME Agency Contacted: 23 Representative spoke with at DME Agency: [place]: PT, OT [place] Agency: [place] ([place])--  [facility] Date HH Agency Contacted: 01/12/20 Time [place]: [phone] Representative spoke with at [place]: [place] ([place]) Interventions     Readmission Risk Interventions Readmission Risk Prevention Plan 01/12/2020  Transportation Screening Complete  PCP or Specialist Appt within 3-5 Days Complete  HRI or [place] Complete  Social Work Consult for [place] Planning/Counseling Complete  Palliative Care Screening Not Applicable  Medication Review Press photographer) Complete  Some recent data might be hidden

## 2020-01-12 NOTE — Consult Note (Signed)
   [facility] Inpatient Consult   01/12/2020  Daniel Whitney 1955/04/27 [id number]  [place] Organization [ACO] Patient: [employer] Medicare  Patient is currently active with [place] [facility] for chronic disease management services.  Patient has been engaged by a [place] Management Coordinator.   Met with the patient at bedside.  Patient states he wants to continue with [place] Coordinator.  States his phone number has not change.  Speaks of how he got mixed up with the holiday and HD schedule.  Patient encouraged to reach back to [place] [facility].  Patient denies issues with transportation, food, or medication needs.  Patient will receive a post hospital call and will be evaluated for assessments and disease process education.    Plan: Following with  Inpatient Transition Of Care [TOC] team member to make aware that [place] [facility] following.   Of note, [facility] Care Management services does not replace or interfere with any services that are needed or arranged by inpatient [facility] care management team.  For additional questions or referrals please contact:  Natividad Brood, RN BSN [facility] Liaison  [phone] business mobile phone Toll free office [phone]  Fax number: [phone] [email or web address]

## 2020-01-12 NOTE — Care Management Obs Status (Signed)
[place] NOTIFICATION   Patient Details  Name: Daniel Whitney MRN: [id number] Date of Birth: 1955/02/18   Medicare Observation Status Notification Given:  Yes    [name], RN 01/12/2020, 11:59 AM

## 2020-01-12 NOTE — Plan of Care (Signed)
  Problem: Education: Goal: Knowledge of General Education information will improve Description: Including pain rating scale, medication(s)/side effects and non-pharmacologic comfort measures 01/12/2020 1114 by Camillia Herter, RN Outcome: Adequate for Discharge 01/12/2020 0755 by Camillia Herter, RN Outcome: Progressing   Problem: Health Behavior/Discharge Planning: Goal: Ability to manage health-related needs will improve 01/12/2020 1114 by Camillia Herter, RN Outcome: Adequate for Discharge 01/12/2020 0755 by Camillia Herter, RN Outcome: Progressing   Problem: Clinical Measurements: Goal: Ability to maintain clinical measurements within normal limits will improve 01/12/2020 1114 by Camillia Herter, RN Outcome: Adequate for Discharge 01/12/2020 0755 by Camillia Herter, RN Outcome: Progressing Goal: Will remain free from infection 01/12/2020 1114 by Camillia Herter, RN Outcome: Adequate for Discharge 01/12/2020 0755 by Camillia Herter, RN Outcome: Progressing Goal: Diagnostic test results will improve 01/12/2020 1114 by Camillia Herter, RN Outcome: Adequate for Discharge 01/12/2020 0755 by Camillia Herter, RN Outcome: Progressing Goal: Respiratory complications will improve 01/12/2020 1114 by Camillia Herter, RN Outcome: Adequate for Discharge 01/12/2020 0755 by Camillia Herter, RN Outcome: Progressing Goal: Cardiovascular complication will be avoided 01/12/2020 1114 by Camillia Herter, RN Outcome: Adequate for Discharge 01/12/2020 0755 by Camillia Herter, RN Outcome: Progressing   Problem: Activity: Goal: Risk for activity intolerance will decrease 01/12/2020 1114 by Camillia Herter, RN Outcome: Adequate for Discharge 01/12/2020 0755 by Camillia Herter, RN Outcome: Progressing   Problem: Nutrition: Goal: Adequate nutrition will be maintained 01/12/2020 1114 by Camillia Herter, RN Outcome: Adequate for Discharge 01/12/2020 0755 by Camillia Herter, RN Outcome: Progressing    Problem: Coping: Goal: Level of anxiety will decrease 01/12/2020 1114 by Camillia Herter, RN Outcome: Adequate for Discharge 01/12/2020 0755 by Camillia Herter, RN Outcome: Progressing   Problem: Elimination: Goal: Will not experience complications related to bowel motility 01/12/2020 1114 by Camillia Herter, RN Outcome: Adequate for Discharge 01/12/2020 0755 by Camillia Herter, RN Outcome: Progressing Goal: Will not experience complications related to urinary retention 01/12/2020 1114 by Camillia Herter, RN Outcome: Adequate for Discharge 01/12/2020 0755 by Camillia Herter, RN Outcome: Progressing   Problem: Pain Managment: Goal: General experience of comfort will improve 01/12/2020 1114 by Camillia Herter, RN Outcome: Adequate for Discharge 01/12/2020 0755 by Camillia Herter, RN Outcome: Progressing   Problem: Safety: Goal: Ability to remain free from injury will improve 01/12/2020 1114 by Camillia Herter, RN Outcome: Adequate for Discharge 01/12/2020 0755 by Camillia Herter, RN Outcome: Progressing   Problem: Skin Integrity: Goal: Risk for impaired skin integrity will decrease 01/12/2020 1114 by Camillia Herter, RN Outcome: Adequate for Discharge 01/12/2020 0755 by Camillia Herter, RN Outcome: Progressing

## 2020-01-12 NOTE — Discharge Summary (Addendum)
Discharge Summary  [name] NWG:[id number] DOB: 1955-04-15  PCP: [name], MD  Admit date: 01/11/2020 Discharge date: 01/12/2020  Time spent: 35 minutes  Recommendations for Outpatient Follow-up:  1. Keep your hemodialysis appointments 2. Follow-up with your PCP 3. Take your medications as prescribed  Discharge Diagnoses:  Active Hospital Problems   Diagnosis Date Noted  . Noncompliance of patient with renal dialysis ([place]) 01/11/2020  . Hyperkalemia, diminished renal excretion 01/01/2018  . Hypothyroid 04/03/2017  . Prediabetes-HgbA1c 6.2 04/03/2017  . AF (paroxysmal atrial fibrillation) ([place]) 12/30/2012  . Morbid obesity ([place]) 12/30/2012  . OSA (obstructive sleep apnea)- on C-pap 12/30/2012  . Hypertension   . Diabetes mellitus with neuropathy [facility])     Resolved Hospital Problems  No resolved problems to display.    Discharge Condition: Stable  Diet recommendation: Resume renal, carb modified diet  Vitals:   01/11/20 2349 01/12/20 0437  BP: 118/60 123/66  Pulse: 74 80  Resp: 17 20  Temp: 98 F (36.7 C) 98.1 F (36.7 C)  SpO2: 100% 100%    History of present illness:   Daniel Whitney is a 64 y.o. male with medical history significant of SDH (2018); CVA; Jehovah's Witness; pulmonary HTN; OSA not on CPAP; CAD; morbid obesity; hypothyroidism; HTN; HLD; ESRD on MWF HD; afib not on AC; and DM2 presenting with weakness, diarrhea, missed HD.  Reportedly, he decided to miss HD in order to spend time with family for Thanksgiving, and they ended up having to be in quarantine due to COVID infection and so he was unable to see them.  ED Course:  ESRD without HD since Tuesday.  Here with weakness, diarrhea.  QRS complexes WIDE, K+ 8.3.  HD 01/11/20.  Given HCO3, glucose/insulin, calcium gluconate, albuterol - repeat EKG was better.  01/12/20: Seen and examined at bedside this morning.  There were no acute events overnight.  He is alert and oriented x4.  He has no new  complaints.  PT assessment recommended home health PT OT.  Okay to discharge from nephrology standpoint.   Hospital Course:  Principal Problem:   Noncompliance of patient with renal dialysis [facility]) Active Problems:   Diabetes mellitus with neuropathy ([facility])   Hypertension   AF (paroxysmal atrial fibrillation) ([facility])   Morbid obesity ([facility])   OSA (obstructive sleep apnea)- on C-pap   Hypothyroid   Prediabetes-HgbA1c 6.2   Hyperkalemia, diminished renal excretion  Severe hyperkalemia, likely secondary to missing hemodialysis sessions, resolved post treatment and hemodialysis. Presented with serum potassium of 8.3, has received hemodialysis and treatment for the hyperkalemia on 01/11/2020.  Serum potassium 5.0 on 01/12/2020.  Patient has hemodialysis on Monday Wednesday and Friday.  Okay to discharge from nephrology standpoint.  Patient will resume outpatient hemodialysis on 01/13/20.  Resolved acute metabolic encephalopathy likely secondary to electrolyte imbalance and uremia Per admitting provider Dr. [name], presented with "excessive somnolence/snoring;"  He is back to his baseline mentation alert and oriented x3 post hemodialysis.  Resolved severe muscle weakness secondary to severe hyperkalemia  Ambulatory dysfunction from polyneuropathy PT assessment recommended home health PT OT Resume home gabapentin and amitriptyline. Continue PT OT with assistance and fall precautions  Hyperlipidemia Resume home Zocor  Noncompliance Keep your hemodialysis sessions as scheduled  Procedures:  None  Consultations:  Nephrology  Discharge Exam: BP 123/66 (BP Location: Right Arm)   Pulse 80   Temp 98.1 F (36.7 C) (Oral)   Resp 20   Ht 5\' 9"  (1.753 m)   Wt 105.7 kg  SpO2 100%   BMI 34.41 kg/m  . General: 64 y.o. year-old male well developed well nourished in no acute distress.  Alert and oriented x3. . Cardiovascular: Regular rate and rhythm with no rubs or gallops.  No  thyromegaly or JVD noted.   Marland Kitchen Respiratory: Clear to auscultation with no wheezes or rales. Good inspiratory effort. . Abdomen: Soft nontender nondistended with normal bowel sounds x4 quadrants. . Musculoskeletal: No lower extremity edema. 2/4 pulses in all 4 extremities. Marland Kitchen Psychiatry: Mood is appropriate for condition and setting  Discharge Instructions You were cared for by a hospitalist during your hospital stay. If you have any questions about your discharge medications or the care you received while you were in the hospital after you are discharged, you can call the unit and asked to speak with the hospitalist on call if the hospitalist that took care of you is not available. Once you are discharged, your primary care physician will handle any further medical issues. Please note that NO REFILLS for any discharge medications will be authorized once you are discharged, as it is imperative that you return to your primary care physician (or establish a relationship with a primary care physician if you do not have one) for your aftercare needs so that they can reassess your need for medications and monitor your lab values.   Allergies as of 01/12/2020      Reactions   Tobacco [tobacco] Shortness Of Breath   Other    Perfume & cologne-eyes itchy      Medication List    STOP taking these medications   allopurinol 100 MG tablet Commonly known as: ZYLOPRIM   HYDROcodone-acetaminophen 5-325 MG tablet Commonly known as: NORCO/VICODIN   sodium chloride 0.65 % Soln nasal spray Commonly known as: OCEAN     TAKE these medications   amitriptyline 50 MG tablet Commonly known as: ELAVIL Take 1 tablet (50 mg total) by mouth at bedtime.   Auryxia 1 GM 210 MG(Fe) tablet Generic drug: ferric citrate Take 210 mg by mouth 3 (three) times daily with meals.   b complex-vitamin c-folic acid 0.8 MG Tabs tablet Take 1 tablet by mouth daily.   cinacalcet 30 MG tablet Commonly known as:  SENSIPAR Take 30 mg by mouth daily with supper.   gabapentin 100 MG capsule Commonly known as: NEURONTIN Take 2 capsules (200 mg total) by mouth at bedtime. 200 mg qam and 300 mg upon return from dialysis on M-W-F for neuropathy What changed:   how much to take  additional instructions   simvastatin 20 MG tablet Commonly known as: ZOCOR Take 1 tablet (20 mg total) by mouth daily.            Durable Medical Equipment  (From admission, onward)         Start     Ordered   01/12/20 1059  For home use only DME Walker rolling  Once       Question Answer Comment  Walker: With 5 Inch Wheels   Patient needs a walker to treat with the following condition Ambulatory dysfunction      01/12/20 1058         Allergies  Allergen Reactions  . Tobacco [Tobacco] Shortness Of Breath  . Other     Perfume & cologne-eyes itchy      The results of significant diagnostics from this hospitalization (including imaging, microbiology, ancillary and laboratory) are listed below for reference.    Significant Diagnostic Studies: [facility] Chest [facility]  1 View  Result Date: 01/11/2020 CLINICAL DATA:  Weakness, diarrhea and productive cough for several days. Patient has dialysis on Monday, Wednesday and Friday, missed dialysis on Friday. EXAM: PORTABLE CHEST 1 VIEW COMPARISON:  02/15/2019 FINDINGS: Mild enlargement of the cardiopericardial silhouette, stable. No mediastinal or masses. Lungs are clear. No convincing pleural effusion and no pneumothorax. Skeletal structures are grossly intact IMPRESSION: No acute cardiopulmonary disease. Electronically Signed   By: Lajean Manes M.D.   On: 01/11/2020 08:28    Microbiology: Recent Results (from the past 240 hour(s))  Resp Panel by RT-PCR (Flu A&B, Covid) Nasopharyngeal Swab     Status: None   Collection Time: 01/11/20  8:11 AM   Specimen: Nasopharyngeal Swab; Nasopharyngeal(NP) swabs in vial transport medium  Result Value Ref Range Status   SARS  Coronavirus 2 by RT PCR NEGATIVE NEGATIVE Final    Comment: (NOTE) SARS-CoV-2 target nucleic acids are NOT DETECTED.  The SARS-CoV-2 RNA is generally detectable in upper respiratory specimens during the acute phase of infection. The lowest concentration of SARS-CoV-2 viral copies this assay can detect is 138 copies/mL. A negative result does not preclude SARS-Cov-2 infection and should not be used as the sole basis for treatment or other patient management decisions. A negative result may occur with  improper specimen collection/handling, submission of specimen other than nasopharyngeal swab, presence of viral mutation(s) within the areas targeted by this assay, and inadequate number of viral copies(<138 copies/mL). A negative result must be combined with clinical observations, patient history, and epidemiological information. The expected result is Negative.  Fact Sheet for Patients:  EntrepreneurPulse.com.au  Fact Sheet for Healthcare Providers:  IncredibleEmployment.be  This test is no t yet approved or cleared by the [place] FDA and  has been authorized for detection and/or diagnosis of SARS-CoV-2 by FDA under an Emergency Use Authorization (EUA). This EUA will remain  in effect (meaning this test can be used) for the duration of the COVID-19 declaration under Section 564(b)(1) of the Act, 21 U.S.C.section 360bbb-3(b)(1), unless the authorization is terminated  or revoked sooner.       Influenza A by PCR NEGATIVE NEGATIVE Final   Influenza B by PCR NEGATIVE NEGATIVE Final    Comment: (NOTE) The Xpert Xpress SARS-CoV-2/FLU/RSV plus assay is intended as an aid in the diagnosis of influenza from Nasopharyngeal swab specimens and should not be used as a sole basis for treatment. Nasal washings and aspirates are unacceptable for Xpert Xpress SARS-CoV-2/FLU/RSV testing.  Fact Sheet for  Patients: EntrepreneurPulse.com.au  Fact Sheet for Healthcare Providers: IncredibleEmployment.be  This test is not yet approved or cleared by the [place] FDA and has been authorized for detection and/or diagnosis of SARS-CoV-2 by FDA under an Emergency Use Authorization (EUA). This EUA will remain in effect (meaning this test can be used) for the duration of the COVID-19 declaration under Section 564(b)(1) of the Act, 21 U.S.C. section 360bbb-3(b)(1), unless the authorization is terminated or revoked.  Performed at [facility], [place] [address] [place], [place] [address]   MRSA PCR Screening     Status: None   Collection Time: 01/12/20 12:41 AM   Specimen: Nasopharyngeal  Result Value Ref Range Status   MRSA by PCR NEGATIVE NEGATIVE Final    Comment:        The GeneXpert MRSA Assay (FDA approved for NASAL specimens only), is one component of a comprehensive MRSA colonization surveillance program. It is not intended to diagnose MRSA infection nor to guide or monitor  treatment for MRSA infections. Performed at [facility], [place] [address], [place], [place] [address]      Labs: Basic Metabolic Panel: Recent Labs  Lab 01/11/20 0744 01/11/20 0801 01/11/20 1407 01/12/20 0231  NA 129* 126* 132* 133*  K >7.5* 8.3* 6.9* 5.0  CL 92* 103 94* 91*  CO2 16*  --  18* 26  GLUCOSE 116* 105* 98 103*  BUN 106* 113* 109* 43*  CREATININE 14.62* 16.10* 15.31* 8.30*  CALCIUM 8.8*  --  8.8* 8.3*  PHOS  --   --  10.2*  --    Liver Function Tests: Recent Labs  Lab 01/11/20 0744 01/11/20 1407  AST 10*  --   ALT 23  --   ALKPHOS 157*  --   BILITOT 0.7  --   PROT 8.1  --   ALBUMIN 3.6 3.3*   No results for input(s): LIPASE, AMYLASE in the last 168 hours. No results for input(s): AMMONIA in the last 168 hours. CBC: Recent Labs  Lab 01/11/20 0744 01/11/20 0801 01/11/20 1406 01/12/20 0231  WBC 8.4  --  7.9 6.5  NEUTROABS  6.5  --   --   --   HGB 10.1* 11.6* 9.4* 9.2*  HCT 32.7* 34.0* 29.6* 28.7*  MCV 95.3  --  94.3 92.3  PLT 178  --  165 153   Cardiac Enzymes: No results for input(s): CKTOTAL, CKMB, CKMBINDEX, TROPONINI in the last 168 hours. BNP: BNP (last 3 results) No results for input(s): BNP in the last 8760 hours.  ProBNP (last 3 results) No results for input(s): PROBNP in the last 8760 hours.  CBG: Recent Labs  Lab 01/11/20 0738 01/11/20 1125 01/11/20 2203  GLUCAP 95 87 103*       Signed:  Kayleen Memos, MD [facility] 01/12/2020, 11:25 AM

## 2020-01-12 NOTE — Progress Notes (Signed)
Initial Nutrition Assessment  DOCUMENTATION CODES:   Obesity unspecified  INTERVENTION:    Nepro Shake po BID, each supplement provides 425 kcal and 19 grams protein  Renal MVI daily  NUTRITION DIAGNOSIS:   Increased nutrient needs related to chronic illness (ESRD on HD) as evidenced by estimated needs.  GOAL:   Patient will meet greater than or equal to 90% of their needs  MONITOR:   PO intake, Supplement acceptance  REASON FOR ASSESSMENT:   Consult, Malnutrition Screening Tool Assessment of nutrition requirement/status  ASSESSMENT:   64 yo male admitted with weakness, diarrhea, missed HD. PMH includes ESRD on HD, CVA, Jehovah's witness, pulmonary HTN, OSA, CAD, morbid obesity, hypothyroidism, HTN, HLD, DM, A fib.  Patient had severe hyperkalemia (8.3) on admission. EKG showed widening of his QRS complex. Received glucose/insulin and calcium in the ED. K down to 5 today.   Patient reports that he has been eating poorly for the past 3-4 days due to diarrhea. He typically has a good appetite at home and eats well. Patient reports usual weight ~250-260 lbs. He has been drinking Nepro shake supplements between meals without difficulty.  Labs reviewed. Na 133, BUN 43, Creat 8.3 CBG: 103 this morning  Medications reviewed and include Sensipar and Rena-vit. Home medications include Sensipar 30 mg daily and Nephro-vite daily.  Usual weights reviewed. Patient has lost 5% of usual weight within the past 11 months, not significant for the time frame and fluctuations are likely with ESRD on HD.  NUTRITION - FOCUSED PHYSICAL EXAM:    Most Recent Value  Orbital Region No depletion  Upper Arm Region No depletion  Thoracic and Lumbar Region No depletion  Buccal Region No depletion  Temple Region No depletion  Clavicle Bone Region Mild depletion  Clavicle and Acromion Bone Region No depletion  Scapular Bone Region No depletion  Dorsal Hand No depletion  Patellar Region No  depletion  Anterior Thigh Region No depletion  Posterior Calf Region No depletion  Edema (RD Assessment) Mild  Hair Reviewed  Eyes Reviewed  Mouth Reviewed  Skin Reviewed  Nails Reviewed       Diet Order:   Diet Order            Diet renal/carb modified with fluid restriction Diet-HS Snack? Nothing; Fluid restriction: 1200 mL Fluid; Room service appropriate? Yes; Fluid consistency: Thin  Diet effective now                 EDUCATION NEEDS:   Education needs have been addressed (patient reports that he has no questions regarding his renal diet)  Skin:  Skin Assessment: Reviewed RN Assessment  Last BM:  11/27 (PTA)  Height:   Ht Readings from Last 1 Encounters:  01/11/20 5\' 9"  (1.753 m)    Weight:   Wt Readings from Last 1 Encounters:  01/11/20 105.7 kg    BMI:  Body mass index is 34.41 kg/m.  Estimated Nutritional Needs:   Kcal:  2400-2600  Protein:  100-120 gm  Fluid:  1 L + UOP    Lucas Mallow, RD, LDN, CNSC Please refer to Amion for contact information.

## 2020-01-12 NOTE — Plan of Care (Signed)

## 2020-01-12 NOTE — Care Management CC44 (Signed)
Condition Code 44 Documentation Completed  Patient Details  Name: Daniel Whitney MRN: [id number] Date of Birth: [birth year]   Condition Code 44 given:  Yes Patient signature on Condition Code 44 notice:  Yes Documentation of 2 MD's agreement:  Yes Code 44 added to claim:  Yes    [name], RN 01/12/2020, 12:00 PM

## 2020-01-12 NOTE — Evaluation (Signed)
Physical Therapy Evaluation Patient Details Name: Daniel Whitney MRN: [id number] DOB: 10-19-1955 Today's Date: 01/12/2020   History of Present Illness  Patient is a 64 y/o male with PMH of SDH, CVA, [place] witness, pulmonary HTN, OSA not on CPAP, CAD, hypothyroidism, HTN, HLD, ESRD on MWF HD, afib, and DM presenting with weakness, diarrhea, missed HD during Thanksgiving week.  Clinical Impression  PTA, patient states he lives alone and ambulates with axillary crutches and use of w/c for mobility. Patient states he will be discharging to his daughter's home in [facility] due to brother recently having stroke and requires extensive assistance. Patient unable to provide home setup for daughter's home at this time and will contact her to obtain information. Patient required min guard for bed mobility and minA for sit to stand for power up to standing. Patient attempted ambulation with 2' fwd and 2' bwd with RW and min guard, states he feels like he is going to fall due to his equilibrium being off. Patient request to sit in recliner, performed stand pivot to recliner with RW and min guard, noted uncontrolled descent into chair. Educated patient on importance of safety and reaching for chair prior to sitting. Patient will benefit from skilled PT services to address listed deficits (see PT Problem List). Recommend HHPT following discharge to maximize functional mobility and safety.     Follow Up Recommendations Home health PT;Supervision/Assistance - 24 hour    Equipment Recommendations  Rolling Aerianna Losey with 5" wheels    Recommendations for Other Services OT consult     Precautions / Restrictions Precautions Precautions: Fall Restrictions Weight Bearing Restrictions: No      Mobility  Bed Mobility Overal bed mobility: Needs Assistance Bed Mobility: Supine to Sit     Supine to sit: Min guard;HOB elevated     General bed mobility comments: cues for sequencing due to patient demonstrating  difficulty getting OOB, no physical assist required    Transfers Overall transfer level: Needs assistance Equipment used: Rolling Dayzha Pogosyan (2 wheeled) Transfers: Sit to/from [facility] Sit to Stand: Min assist Stand pivot transfers: Min guard       General transfer comment: minA for power up to standing. Stand pivot transfer with RW and min guard, patient with uncontrolled descent into chair  Ambulation/Gait Ambulation/Gait assistance: Min guard Gait Distance (Feet): 4 Feet Assistive device: Rolling Shaquela Weichert (2 wheeled) Gait Pattern/deviations: Step-to pattern;Wide base of support;Trunk flexed;Decreased stride length Gait velocity: decreased   General Gait Details: 2' fwd and 2' bwd. Patient states "my equilibrium feels off and I feel like I'm going to fall."   Stairs            Wheelchair Mobility    Modified Rankin (Stroke Patients Only)       Balance Overall balance assessment: Needs assistance Sitting-balance support: No upper extremity supported;Feet supported Sitting balance-Leahy Scale: Fair     Standing balance support: Bilateral upper extremity supported;During functional activity Standing balance-Leahy Scale: Poor Standing balance comment: reliant on UE support                             Pertinent Vitals/Pain Pain Assessment: 0-10 Pain Score: 8  Pain Location: L hand, R hand/fingers, B feet Pain Descriptors / Indicators: Cramping;Aching;Tightness Pain Intervention(s): Monitored during session    Home Living Family/patient expects to be discharged to:: Private residence Living Arrangements: Alone Available Help at Discharge: Family;Available 24 hours/day Type of Home: House (daughter's house; pt unsure  of home layout)         Home Equipment: Crutches;Maheen Cwikla - 4 wheels;Bedside commode;Wheelchair - manual Additional Comments: plans to go to daughter's house at d/c    Prior Function Level of Independence: Independent  with assistive device(s)         Comments: axillary crutches and w/c     Hand Dominance        Extremity/Trunk Assessment   Upper Extremity Assessment Upper Extremity Assessment: Generalized weakness    Lower Extremity Assessment Lower Extremity Assessment: Generalized weakness (R LE weaker than LLE; numbness in B feet)       Communication   Communication: No difficulties  Cognition Arousal/Alertness: Awake/alert Behavior During Therapy: WFL for tasks assessed/performed Overall Cognitive Status: Within Functional Limits for tasks assessed                                        General Comments      Exercises     Assessment/Plan    PT Assessment Patient needs continued PT services  PT Problem List Decreased strength;Decreased activity tolerance;Decreased balance;Decreased mobility;Decreased coordination;Impaired sensation;Decreased safety awareness       PT Treatment Interventions Gait training;Stair training;DME instruction;Functional mobility training;Therapeutic activities;Therapeutic exercise;Balance training;Patient/family education    PT Goals (Current goals can be found in the Care Plan section)  Acute Rehab PT Goals Patient Stated Goal: to go home to my house PT Goal Formulation: With patient Time For Goal Achievement: 01/26/20 Potential to Achieve Goals: Good    Frequency Min 3X/week   Barriers to discharge        Co-evaluation               AM-PAC PT "6 Clicks" Mobility  Outcome Measure Help needed turning from your back to your side while in a flat bed without using bedrails?: A Little Help needed moving from lying on your back to sitting on the side of a flat bed without using bedrails?: A Little Help needed moving to and from a bed to a chair (including a wheelchair)?: A Little Help needed standing up from a chair using your arms (e.g., wheelchair or bedside chair)?: A Little Help needed to walk in hospital room?: A  Little Help needed climbing 3-5 steps with a railing? : A Lot 6 Click Score: 17    End of Session Equipment Utilized During Treatment: Gait belt Activity Tolerance: Patient limited by fatigue Patient left: in chair;with call bell/phone within reach Nurse Communication: Mobility status PT Visit Diagnosis: Unsteadiness on feet (R26.81);Muscle weakness (generalized) (M62.81);Difficulty in walking, not elsewhere classified (R26.2)    Time: 7902-4097 PT Time Calculation (min) (ACUTE ONLY): 27 min   Charges:   PT Evaluation $PT Eval Low Complexity: 1 Low PT Treatments $Therapeutic Activity: 8-22 mins        Perrin Maltese, PT, DPT Acute Rehabilitation Services Pager [phone] Office [phone]   Daniel Whitney 01/12/2020, 9:17 AM

## 2020-01-13 ENCOUNTER — Other Ambulatory Visit: Payer: Self-pay

## 2020-01-13 ENCOUNTER — Telehealth: Payer: Self-pay | Admitting: Nephrology

## 2020-01-13 NOTE — Patient Outreach (Signed)
[place] [facility]) Care Management  01/13/2020  Daniel Whitney 14-Jun-1955 [id number]   Telephone Assessment Transition of Care   Outreach attempt to patient to follow up on recent discharge from hospital on 01/12/20(PCP does TOC). No answer. RN CM left HIPAA compliant voicemail message along with contact info.     Plan: RN CM will make outreach attempt to patient within 3-4 business days.  Enzo Montgomery, RN,BSN,CCM [place] [facility] Telephonic Care Management Coordinator Direct Phone: [phone] Toll Free: [phone] Fax: [phone]

## 2020-01-13 NOTE — Telephone Encounter (Signed)
Transition of Care Contact from [place]   Date of Discharge: 01/12/20 Date of Contact: 01/13/20 Method of contact: phone Talked to patient   Patient contacted to discuss transition of care form recent hospitaliztion. Patient was admitted to [facility] from 11/28 to 11/30 with the discharge diagnosis of severe hyperkalemia and metabolic encephalopathy.     Medication changes were reviewed - allopurinol and hydrocodone stopped.   Patient will follow up with is outpatient dialysis center today, 01/13/20.   Other follow up needs include - requesting pain medication, advised to contact PCP as our practice does not prescribe pain medications.    Jen Mow, PA-C [facility] Pager: [phone]

## 2020-01-14 ENCOUNTER — Telehealth: Payer: Self-pay | Admitting: Nephrology

## 2020-01-14 ENCOUNTER — Other Ambulatory Visit: Payer: Self-pay

## 2020-01-14 NOTE — Patient Outreach (Signed)
[place] [facility]) Care Management  01/14/2020  Kobyn Kray 24-Aug-1955 [id number]   Telephone Assessment Transition of Care   Outreach attempt # 2 to patient.   Outreach attempt # 2 to patient. Spoke with patietn who reported this was not a good time to talk and requested call back at another time.     Plan:  RN CM will make outreach attempt to patient within 3-4 business days.   Enzo Montgomery, RN,BSN,CCM [place] [facility] Telephonic Care Management Coordinator Direct Phone: [phone] Toll Free: [phone] Fax: [phone]

## 2020-01-14 NOTE — Telephone Encounter (Signed)
Transition of Care Contact from [place]  Date of Discharge: 01/11/20 Date of Contact: 11/30 21 Method of contact: phone - attempted  Attempted to contact patient to discuss transition of care from inpatient admission.  Patient did not answer the phone.  Message was left on patient's voicemail informing them we would attempt to call them again and if unable to reach will follow up at dialysis.

## 2020-01-15 DIAGNOSIS — N2581 Secondary hyperparathyroidism of renal origin: Secondary | ICD-10-CM | POA: Diagnosis not present

## 2020-01-15 DIAGNOSIS — N186 End stage renal disease: Secondary | ICD-10-CM | POA: Diagnosis not present

## 2020-01-15 DIAGNOSIS — Z992 Dependence on renal dialysis: Secondary | ICD-10-CM | POA: Diagnosis not present

## 2020-01-18 ENCOUNTER — Other Ambulatory Visit: Payer: Self-pay

## 2020-01-18 NOTE — Patient Outreach (Signed)
[place] [facility]) Care Management  01/18/2020  Darric Plante 1955-09-05 [id number]    Telephone Assessment Transition of Care    Unsuccessful outreach attempt # 3 to patient.    Plan: RN CM will make outreach attempt to patient within 3-4 wks if no response from letter mailed to patient.  Enzo Montgomery, RN,BSN,CCM [place] [facility] Telephonic Care Management Coordinator Direct Phone: [phone] Toll Free: [phone] Fax: [phone]

## 2020-01-21 DIAGNOSIS — I132 Hypertensive heart and chronic kidney disease with heart failure and with stage 5 chronic kidney disease, or end stage renal disease: Secondary | ICD-10-CM | POA: Diagnosis not present

## 2020-01-21 DIAGNOSIS — E1142 Type 2 diabetes mellitus with diabetic polyneuropathy: Secondary | ICD-10-CM | POA: Diagnosis not present

## 2020-01-21 DIAGNOSIS — M5459 Other low back pain: Secondary | ICD-10-CM | POA: Diagnosis not present

## 2020-01-21 DIAGNOSIS — G8929 Other chronic pain: Secondary | ICD-10-CM | POA: Diagnosis not present

## 2020-01-21 DIAGNOSIS — I480 Paroxysmal atrial fibrillation: Secondary | ICD-10-CM | POA: Diagnosis not present

## 2020-01-21 DIAGNOSIS — I509 Heart failure, unspecified: Secondary | ICD-10-CM | POA: Diagnosis not present

## 2020-01-21 DIAGNOSIS — E1122 Type 2 diabetes mellitus with diabetic chronic kidney disease: Secondary | ICD-10-CM | POA: Diagnosis not present

## 2020-01-21 DIAGNOSIS — I2510 Atherosclerotic heart disease of native coronary artery without angina pectoris: Secondary | ICD-10-CM | POA: Diagnosis not present

## 2020-01-21 DIAGNOSIS — N186 End stage renal disease: Secondary | ICD-10-CM | POA: Diagnosis not present

## 2020-01-23 DIAGNOSIS — Z992 Dependence on renal dialysis: Secondary | ICD-10-CM | POA: Diagnosis not present

## 2020-01-23 DIAGNOSIS — E1142 Type 2 diabetes mellitus with diabetic polyneuropathy: Secondary | ICD-10-CM | POA: Diagnosis not present

## 2020-01-23 DIAGNOSIS — N186 End stage renal disease: Secondary | ICD-10-CM | POA: Diagnosis not present

## 2020-01-23 DIAGNOSIS — G8929 Other chronic pain: Secondary | ICD-10-CM | POA: Diagnosis not present

## 2020-01-23 DIAGNOSIS — I480 Paroxysmal atrial fibrillation: Secondary | ICD-10-CM | POA: Diagnosis not present

## 2020-01-23 DIAGNOSIS — I2510 Atherosclerotic heart disease of native coronary artery without angina pectoris: Secondary | ICD-10-CM | POA: Diagnosis not present

## 2020-01-23 DIAGNOSIS — N2581 Secondary hyperparathyroidism of renal origin: Secondary | ICD-10-CM | POA: Diagnosis not present

## 2020-01-23 DIAGNOSIS — I132 Hypertensive heart and chronic kidney disease with heart failure and with stage 5 chronic kidney disease, or end stage renal disease: Secondary | ICD-10-CM | POA: Diagnosis not present

## 2020-01-23 DIAGNOSIS — E1122 Type 2 diabetes mellitus with diabetic chronic kidney disease: Secondary | ICD-10-CM | POA: Diagnosis not present

## 2020-01-23 DIAGNOSIS — I509 Heart failure, unspecified: Secondary | ICD-10-CM | POA: Diagnosis not present

## 2020-01-23 DIAGNOSIS — M5459 Other low back pain: Secondary | ICD-10-CM | POA: Diagnosis not present

## 2020-01-25 DIAGNOSIS — N2581 Secondary hyperparathyroidism of renal origin: Secondary | ICD-10-CM | POA: Diagnosis not present

## 2020-01-25 DIAGNOSIS — N186 End stage renal disease: Secondary | ICD-10-CM | POA: Diagnosis not present

## 2020-01-25 DIAGNOSIS — Z992 Dependence on renal dialysis: Secondary | ICD-10-CM | POA: Diagnosis not present

## 2020-01-26 DIAGNOSIS — G8929 Other chronic pain: Secondary | ICD-10-CM | POA: Diagnosis not present

## 2020-01-26 DIAGNOSIS — E1122 Type 2 diabetes mellitus with diabetic chronic kidney disease: Secondary | ICD-10-CM | POA: Diagnosis not present

## 2020-01-26 DIAGNOSIS — I480 Paroxysmal atrial fibrillation: Secondary | ICD-10-CM | POA: Diagnosis not present

## 2020-01-26 DIAGNOSIS — I132 Hypertensive heart and chronic kidney disease with heart failure and with stage 5 chronic kidney disease, or end stage renal disease: Secondary | ICD-10-CM | POA: Diagnosis not present

## 2020-01-26 DIAGNOSIS — M5459 Other low back pain: Secondary | ICD-10-CM | POA: Diagnosis not present

## 2020-01-26 DIAGNOSIS — E1142 Type 2 diabetes mellitus with diabetic polyneuropathy: Secondary | ICD-10-CM | POA: Diagnosis not present

## 2020-01-26 DIAGNOSIS — I2510 Atherosclerotic heart disease of native coronary artery without angina pectoris: Secondary | ICD-10-CM | POA: Diagnosis not present

## 2020-01-26 DIAGNOSIS — I509 Heart failure, unspecified: Secondary | ICD-10-CM | POA: Diagnosis not present

## 2020-01-26 DIAGNOSIS — N186 End stage renal disease: Secondary | ICD-10-CM | POA: Diagnosis not present

## 2020-01-28 DIAGNOSIS — I509 Heart failure, unspecified: Secondary | ICD-10-CM | POA: Diagnosis not present

## 2020-01-28 DIAGNOSIS — I480 Paroxysmal atrial fibrillation: Secondary | ICD-10-CM | POA: Diagnosis not present

## 2020-01-28 DIAGNOSIS — E1122 Type 2 diabetes mellitus with diabetic chronic kidney disease: Secondary | ICD-10-CM | POA: Diagnosis not present

## 2020-01-28 DIAGNOSIS — I2510 Atherosclerotic heart disease of native coronary artery without angina pectoris: Secondary | ICD-10-CM | POA: Diagnosis not present

## 2020-01-28 DIAGNOSIS — N186 End stage renal disease: Secondary | ICD-10-CM | POA: Diagnosis not present

## 2020-01-28 DIAGNOSIS — E1142 Type 2 diabetes mellitus with diabetic polyneuropathy: Secondary | ICD-10-CM | POA: Diagnosis not present

## 2020-01-28 DIAGNOSIS — M5459 Other low back pain: Secondary | ICD-10-CM | POA: Diagnosis not present

## 2020-01-28 DIAGNOSIS — I132 Hypertensive heart and chronic kidney disease with heart failure and with stage 5 chronic kidney disease, or end stage renal disease: Secondary | ICD-10-CM | POA: Diagnosis not present

## 2020-01-28 DIAGNOSIS — G8929 Other chronic pain: Secondary | ICD-10-CM | POA: Diagnosis not present

## 2020-01-29 DIAGNOSIS — Z992 Dependence on renal dialysis: Secondary | ICD-10-CM | POA: Diagnosis not present

## 2020-01-29 DIAGNOSIS — N2581 Secondary hyperparathyroidism of renal origin: Secondary | ICD-10-CM | POA: Diagnosis not present

## 2020-01-29 DIAGNOSIS — N186 End stage renal disease: Secondary | ICD-10-CM | POA: Diagnosis not present

## 2020-02-02 DIAGNOSIS — I2510 Atherosclerotic heart disease of native coronary artery without angina pectoris: Secondary | ICD-10-CM | POA: Diagnosis not present

## 2020-02-02 DIAGNOSIS — I509 Heart failure, unspecified: Secondary | ICD-10-CM | POA: Diagnosis not present

## 2020-02-02 DIAGNOSIS — G8929 Other chronic pain: Secondary | ICD-10-CM | POA: Diagnosis not present

## 2020-02-02 DIAGNOSIS — N186 End stage renal disease: Secondary | ICD-10-CM | POA: Diagnosis not present

## 2020-02-02 DIAGNOSIS — I132 Hypertensive heart and chronic kidney disease with heart failure and with stage 5 chronic kidney disease, or end stage renal disease: Secondary | ICD-10-CM | POA: Diagnosis not present

## 2020-02-02 DIAGNOSIS — M5459 Other low back pain: Secondary | ICD-10-CM | POA: Diagnosis not present

## 2020-02-02 DIAGNOSIS — I480 Paroxysmal atrial fibrillation: Secondary | ICD-10-CM | POA: Diagnosis not present

## 2020-02-02 DIAGNOSIS — E1142 Type 2 diabetes mellitus with diabetic polyneuropathy: Secondary | ICD-10-CM | POA: Diagnosis not present

## 2020-02-02 DIAGNOSIS — E1122 Type 2 diabetes mellitus with diabetic chronic kidney disease: Secondary | ICD-10-CM | POA: Diagnosis not present

## 2020-02-04 DIAGNOSIS — G8929 Other chronic pain: Secondary | ICD-10-CM | POA: Diagnosis not present

## 2020-02-04 DIAGNOSIS — N2581 Secondary hyperparathyroidism of renal origin: Secondary | ICD-10-CM | POA: Diagnosis not present

## 2020-02-04 DIAGNOSIS — N186 End stage renal disease: Secondary | ICD-10-CM | POA: Diagnosis not present

## 2020-02-04 DIAGNOSIS — E1142 Type 2 diabetes mellitus with diabetic polyneuropathy: Secondary | ICD-10-CM | POA: Diagnosis not present

## 2020-02-04 DIAGNOSIS — Z992 Dependence on renal dialysis: Secondary | ICD-10-CM | POA: Diagnosis not present

## 2020-02-04 DIAGNOSIS — I2510 Atherosclerotic heart disease of native coronary artery without angina pectoris: Secondary | ICD-10-CM | POA: Diagnosis not present

## 2020-02-04 DIAGNOSIS — E1122 Type 2 diabetes mellitus with diabetic chronic kidney disease: Secondary | ICD-10-CM | POA: Diagnosis not present

## 2020-02-04 DIAGNOSIS — I132 Hypertensive heart and chronic kidney disease with heart failure and with stage 5 chronic kidney disease, or end stage renal disease: Secondary | ICD-10-CM | POA: Diagnosis not present

## 2020-02-04 DIAGNOSIS — I509 Heart failure, unspecified: Secondary | ICD-10-CM | POA: Diagnosis not present

## 2020-02-04 DIAGNOSIS — M5459 Other low back pain: Secondary | ICD-10-CM | POA: Diagnosis not present

## 2020-02-04 DIAGNOSIS — I480 Paroxysmal atrial fibrillation: Secondary | ICD-10-CM | POA: Diagnosis not present

## 2020-02-05 DIAGNOSIS — N2581 Secondary hyperparathyroidism of renal origin: Secondary | ICD-10-CM | POA: Diagnosis not present

## 2020-02-05 DIAGNOSIS — Z992 Dependence on renal dialysis: Secondary | ICD-10-CM | POA: Diagnosis not present

## 2020-02-05 DIAGNOSIS — N186 End stage renal disease: Secondary | ICD-10-CM | POA: Diagnosis not present

## 2020-02-08 DIAGNOSIS — Z992 Dependence on renal dialysis: Secondary | ICD-10-CM | POA: Diagnosis not present

## 2020-02-08 DIAGNOSIS — N2581 Secondary hyperparathyroidism of renal origin: Secondary | ICD-10-CM | POA: Diagnosis not present

## 2020-02-08 DIAGNOSIS — N186 End stage renal disease: Secondary | ICD-10-CM | POA: Diagnosis not present

## 2020-02-09 ENCOUNTER — Other Ambulatory Visit: Payer: Self-pay

## 2020-02-09 NOTE — Patient Outreach (Signed)
[place] [facility]) Care Management  02/09/2020  Timohty Renbarger [birth year] [id number]   Telephone Assessment   Unsuccessful outreach attempt to patient.     Plan: RN CM will make outreach attempt to patient within the month of Feb if no return call from patient.   Enzo Montgomery, RN,BSN,CCM [place] [facility] Telephonic Care Management Coordinator Direct Phone: [phone] Toll Free: [phone] Fax: [phone]

## 2020-02-11 DIAGNOSIS — I480 Paroxysmal atrial fibrillation: Secondary | ICD-10-CM | POA: Diagnosis not present

## 2020-02-11 DIAGNOSIS — I2510 Atherosclerotic heart disease of native coronary artery without angina pectoris: Secondary | ICD-10-CM | POA: Diagnosis not present

## 2020-02-11 DIAGNOSIS — I509 Heart failure, unspecified: Secondary | ICD-10-CM | POA: Diagnosis not present

## 2020-02-11 DIAGNOSIS — E1122 Type 2 diabetes mellitus with diabetic chronic kidney disease: Secondary | ICD-10-CM | POA: Diagnosis not present

## 2020-02-11 DIAGNOSIS — I132 Hypertensive heart and chronic kidney disease with heart failure and with stage 5 chronic kidney disease, or end stage renal disease: Secondary | ICD-10-CM | POA: Diagnosis not present

## 2020-02-11 DIAGNOSIS — E1142 Type 2 diabetes mellitus with diabetic polyneuropathy: Secondary | ICD-10-CM | POA: Diagnosis not present

## 2020-02-11 DIAGNOSIS — N186 End stage renal disease: Secondary | ICD-10-CM | POA: Diagnosis not present

## 2020-02-11 DIAGNOSIS — M5459 Other low back pain: Secondary | ICD-10-CM | POA: Diagnosis not present

## 2020-02-11 DIAGNOSIS — G8929 Other chronic pain: Secondary | ICD-10-CM | POA: Diagnosis not present

## 2020-02-12 DIAGNOSIS — Z992 Dependence on renal dialysis: Secondary | ICD-10-CM | POA: Diagnosis not present

## 2020-02-12 DIAGNOSIS — I120 Hypertensive chronic kidney disease with stage 5 chronic kidney disease or end stage renal disease: Secondary | ICD-10-CM | POA: Diagnosis not present

## 2020-02-12 DIAGNOSIS — N2581 Secondary hyperparathyroidism of renal origin: Secondary | ICD-10-CM | POA: Diagnosis not present

## 2020-02-12 DIAGNOSIS — N186 End stage renal disease: Secondary | ICD-10-CM | POA: Diagnosis not present

## 2020-02-16 DIAGNOSIS — N186 End stage renal disease: Secondary | ICD-10-CM | POA: Diagnosis not present

## 2020-02-16 DIAGNOSIS — I132 Hypertensive heart and chronic kidney disease with heart failure and with stage 5 chronic kidney disease, or end stage renal disease: Secondary | ICD-10-CM | POA: Diagnosis not present

## 2020-02-16 DIAGNOSIS — G8929 Other chronic pain: Secondary | ICD-10-CM | POA: Diagnosis not present

## 2020-02-16 DIAGNOSIS — I480 Paroxysmal atrial fibrillation: Secondary | ICD-10-CM | POA: Diagnosis not present

## 2020-02-16 DIAGNOSIS — E1142 Type 2 diabetes mellitus with diabetic polyneuropathy: Secondary | ICD-10-CM | POA: Diagnosis not present

## 2020-02-16 DIAGNOSIS — M5459 Other low back pain: Secondary | ICD-10-CM | POA: Diagnosis not present

## 2020-02-16 DIAGNOSIS — E1122 Type 2 diabetes mellitus with diabetic chronic kidney disease: Secondary | ICD-10-CM | POA: Diagnosis not present

## 2020-02-16 DIAGNOSIS — I509 Heart failure, unspecified: Secondary | ICD-10-CM | POA: Diagnosis not present

## 2020-02-16 DIAGNOSIS — I2510 Atherosclerotic heart disease of native coronary artery without angina pectoris: Secondary | ICD-10-CM | POA: Diagnosis not present

## 2020-02-17 DIAGNOSIS — N186 End stage renal disease: Secondary | ICD-10-CM | POA: Diagnosis not present

## 2020-02-17 DIAGNOSIS — N2581 Secondary hyperparathyroidism of renal origin: Secondary | ICD-10-CM | POA: Diagnosis not present

## 2020-02-17 DIAGNOSIS — Z992 Dependence on renal dialysis: Secondary | ICD-10-CM | POA: Diagnosis not present

## 2020-02-19 DIAGNOSIS — E1142 Type 2 diabetes mellitus with diabetic polyneuropathy: Secondary | ICD-10-CM | POA: Diagnosis not present

## 2020-02-19 DIAGNOSIS — N186 End stage renal disease: Secondary | ICD-10-CM | POA: Diagnosis not present

## 2020-02-19 DIAGNOSIS — I2510 Atherosclerotic heart disease of native coronary artery without angina pectoris: Secondary | ICD-10-CM | POA: Diagnosis not present

## 2020-02-19 DIAGNOSIS — I480 Paroxysmal atrial fibrillation: Secondary | ICD-10-CM | POA: Diagnosis not present

## 2020-02-19 DIAGNOSIS — I132 Hypertensive heart and chronic kidney disease with heart failure and with stage 5 chronic kidney disease, or end stage renal disease: Secondary | ICD-10-CM | POA: Diagnosis not present

## 2020-02-19 DIAGNOSIS — G8929 Other chronic pain: Secondary | ICD-10-CM | POA: Diagnosis not present

## 2020-02-19 DIAGNOSIS — E1122 Type 2 diabetes mellitus with diabetic chronic kidney disease: Secondary | ICD-10-CM | POA: Diagnosis not present

## 2020-02-19 DIAGNOSIS — I509 Heart failure, unspecified: Secondary | ICD-10-CM | POA: Diagnosis not present

## 2020-02-19 DIAGNOSIS — M5459 Other low back pain: Secondary | ICD-10-CM | POA: Diagnosis not present

## 2020-02-22 DIAGNOSIS — Z992 Dependence on renal dialysis: Secondary | ICD-10-CM | POA: Diagnosis not present

## 2020-02-22 DIAGNOSIS — N186 End stage renal disease: Secondary | ICD-10-CM | POA: Diagnosis not present

## 2020-02-22 DIAGNOSIS — N2581 Secondary hyperparathyroidism of renal origin: Secondary | ICD-10-CM | POA: Diagnosis not present

## 2020-02-23 DIAGNOSIS — G8929 Other chronic pain: Secondary | ICD-10-CM | POA: Diagnosis not present

## 2020-02-23 DIAGNOSIS — I480 Paroxysmal atrial fibrillation: Secondary | ICD-10-CM | POA: Diagnosis not present

## 2020-02-23 DIAGNOSIS — N186 End stage renal disease: Secondary | ICD-10-CM | POA: Diagnosis not present

## 2020-02-23 DIAGNOSIS — I2510 Atherosclerotic heart disease of native coronary artery without angina pectoris: Secondary | ICD-10-CM | POA: Diagnosis not present

## 2020-02-23 DIAGNOSIS — E1122 Type 2 diabetes mellitus with diabetic chronic kidney disease: Secondary | ICD-10-CM | POA: Diagnosis not present

## 2020-02-23 DIAGNOSIS — I132 Hypertensive heart and chronic kidney disease with heart failure and with stage 5 chronic kidney disease, or end stage renal disease: Secondary | ICD-10-CM | POA: Diagnosis not present

## 2020-02-23 DIAGNOSIS — E1142 Type 2 diabetes mellitus with diabetic polyneuropathy: Secondary | ICD-10-CM | POA: Diagnosis not present

## 2020-02-23 DIAGNOSIS — I509 Heart failure, unspecified: Secondary | ICD-10-CM | POA: Diagnosis not present

## 2020-02-23 DIAGNOSIS — M5459 Other low back pain: Secondary | ICD-10-CM | POA: Diagnosis not present

## 2020-02-24 DIAGNOSIS — N186 End stage renal disease: Secondary | ICD-10-CM | POA: Diagnosis not present

## 2020-02-24 DIAGNOSIS — Z992 Dependence on renal dialysis: Secondary | ICD-10-CM | POA: Diagnosis not present

## 2020-02-24 DIAGNOSIS — N2581 Secondary hyperparathyroidism of renal origin: Secondary | ICD-10-CM | POA: Diagnosis not present

## 2020-02-25 DIAGNOSIS — N186 End stage renal disease: Secondary | ICD-10-CM | POA: Diagnosis not present

## 2020-02-25 DIAGNOSIS — I2510 Atherosclerotic heart disease of native coronary artery without angina pectoris: Secondary | ICD-10-CM | POA: Diagnosis not present

## 2020-02-25 DIAGNOSIS — M5459 Other low back pain: Secondary | ICD-10-CM | POA: Diagnosis not present

## 2020-02-25 DIAGNOSIS — G8929 Other chronic pain: Secondary | ICD-10-CM | POA: Diagnosis not present

## 2020-02-25 DIAGNOSIS — E1122 Type 2 diabetes mellitus with diabetic chronic kidney disease: Secondary | ICD-10-CM | POA: Diagnosis not present

## 2020-02-25 DIAGNOSIS — E1142 Type 2 diabetes mellitus with diabetic polyneuropathy: Secondary | ICD-10-CM | POA: Diagnosis not present

## 2020-02-25 DIAGNOSIS — I509 Heart failure, unspecified: Secondary | ICD-10-CM | POA: Diagnosis not present

## 2020-02-25 DIAGNOSIS — I132 Hypertensive heart and chronic kidney disease with heart failure and with stage 5 chronic kidney disease, or end stage renal disease: Secondary | ICD-10-CM | POA: Diagnosis not present

## 2020-02-25 DIAGNOSIS — I480 Paroxysmal atrial fibrillation: Secondary | ICD-10-CM | POA: Diagnosis not present

## 2020-02-26 DIAGNOSIS — N186 End stage renal disease: Secondary | ICD-10-CM | POA: Diagnosis not present

## 2020-02-26 DIAGNOSIS — N2581 Secondary hyperparathyroidism of renal origin: Secondary | ICD-10-CM | POA: Diagnosis not present

## 2020-02-26 DIAGNOSIS — Z992 Dependence on renal dialysis: Secondary | ICD-10-CM | POA: Diagnosis not present

## 2020-02-27 DIAGNOSIS — I132 Hypertensive heart and chronic kidney disease with heart failure and with stage 5 chronic kidney disease, or end stage renal disease: Secondary | ICD-10-CM | POA: Diagnosis not present

## 2020-02-27 DIAGNOSIS — I509 Heart failure, unspecified: Secondary | ICD-10-CM | POA: Diagnosis not present

## 2020-02-27 DIAGNOSIS — G8929 Other chronic pain: Secondary | ICD-10-CM | POA: Diagnosis not present

## 2020-02-27 DIAGNOSIS — E1142 Type 2 diabetes mellitus with diabetic polyneuropathy: Secondary | ICD-10-CM | POA: Diagnosis not present

## 2020-02-27 DIAGNOSIS — I2510 Atherosclerotic heart disease of native coronary artery without angina pectoris: Secondary | ICD-10-CM | POA: Diagnosis not present

## 2020-02-27 DIAGNOSIS — M5459 Other low back pain: Secondary | ICD-10-CM | POA: Diagnosis not present

## 2020-02-27 DIAGNOSIS — N186 End stage renal disease: Secondary | ICD-10-CM | POA: Diagnosis not present

## 2020-02-27 DIAGNOSIS — I480 Paroxysmal atrial fibrillation: Secondary | ICD-10-CM | POA: Diagnosis not present

## 2020-02-27 DIAGNOSIS — E1122 Type 2 diabetes mellitus with diabetic chronic kidney disease: Secondary | ICD-10-CM | POA: Diagnosis not present

## 2020-03-08 ENCOUNTER — Encounter (HOSPITAL_COMMUNITY): Payer: Self-pay | Admitting: *Deleted

## 2020-03-08 ENCOUNTER — Emergency Department (HOSPITAL_COMMUNITY)
Admission: EM | Admit: 2020-03-08 | Discharge: 2020-03-09 | Disposition: A | Discharge status: Home / Self Care | Payer: Medicare HMO | Attending: Emergency Medicine | Admitting: Emergency Medicine

## 2020-03-08 ENCOUNTER — Other Ambulatory Visit: Payer: Self-pay

## 2020-03-08 DIAGNOSIS — N186 End stage renal disease: Secondary | ICD-10-CM | POA: Diagnosis not present

## 2020-03-08 DIAGNOSIS — E039 Hypothyroidism, unspecified: Secondary | ICD-10-CM | POA: Insufficient documentation

## 2020-03-08 DIAGNOSIS — Z96653 Presence of artificial knee joint, bilateral: Secondary | ICD-10-CM | POA: Diagnosis not present

## 2020-03-08 DIAGNOSIS — E11319 Type 2 diabetes mellitus with unspecified diabetic retinopathy without macular edema: Secondary | ICD-10-CM | POA: Diagnosis not present

## 2020-03-08 DIAGNOSIS — I132 Hypertensive heart and chronic kidney disease with heart failure and with stage 5 chronic kidney disease, or end stage renal disease: Secondary | ICD-10-CM | POA: Insufficient documentation

## 2020-03-08 DIAGNOSIS — D631 Anemia in chronic kidney disease: Secondary | ICD-10-CM | POA: Diagnosis not present

## 2020-03-08 DIAGNOSIS — I5043 Acute on chronic combined systolic (congestive) and diastolic (congestive) heart failure: Secondary | ICD-10-CM | POA: Diagnosis not present

## 2020-03-08 DIAGNOSIS — Z992 Dependence on renal dialysis: Secondary | ICD-10-CM | POA: Diagnosis not present

## 2020-03-08 DIAGNOSIS — E875 Hyperkalemia: Secondary | ICD-10-CM

## 2020-03-08 DIAGNOSIS — Z9115 Patient's noncompliance with renal dialysis: Secondary | ICD-10-CM | POA: Diagnosis not present

## 2020-03-08 DIAGNOSIS — R0602 Shortness of breath: Secondary | ICD-10-CM | POA: Diagnosis not present

## 2020-03-08 DIAGNOSIS — R531 Weakness: Secondary | ICD-10-CM | POA: Diagnosis not present

## 2020-03-08 DIAGNOSIS — R0902 Hypoxemia: Secondary | ICD-10-CM | POA: Diagnosis not present

## 2020-03-08 DIAGNOSIS — E1140 Type 2 diabetes mellitus with diabetic neuropathy, unspecified: Secondary | ICD-10-CM | POA: Insufficient documentation

## 2020-03-08 DIAGNOSIS — N250 Renal osteodystrophy: Secondary | ICD-10-CM | POA: Diagnosis not present

## 2020-03-08 DIAGNOSIS — I4891 Unspecified atrial fibrillation: Secondary | ICD-10-CM | POA: Diagnosis not present

## 2020-03-08 DIAGNOSIS — E1122 Type 2 diabetes mellitus with diabetic chronic kidney disease: Secondary | ICD-10-CM | POA: Insufficient documentation

## 2020-03-08 DIAGNOSIS — R001 Bradycardia, unspecified: Secondary | ICD-10-CM | POA: Diagnosis not present

## 2020-03-08 DIAGNOSIS — Z96641 Presence of right artificial hip joint: Secondary | ICD-10-CM | POA: Insufficient documentation

## 2020-03-08 DIAGNOSIS — Z20822 Contact with and (suspected) exposure to covid-19: Secondary | ICD-10-CM | POA: Diagnosis not present

## 2020-03-08 DIAGNOSIS — I120 Hypertensive chronic kidney disease with stage 5 chronic kidney disease or end stage renal disease: Secondary | ICD-10-CM | POA: Diagnosis not present

## 2020-03-08 DIAGNOSIS — R5383 Other fatigue: Secondary | ICD-10-CM | POA: Diagnosis present

## 2020-03-08 LAB — CBC
HCT: 29.8 % — ABNORMAL LOW (ref 39.0–52.0)
Hemoglobin: 9.7 g/dL — ABNORMAL LOW (ref 13.0–17.0)
MCH: 30.9 pg (ref 26.0–34.0)
MCHC: 32.6 g/dL (ref 30.0–36.0)
MCV: 94.9 fL (ref 80.0–100.0)
Platelets: 108 10*3/uL — ABNORMAL LOW (ref 150–400)
RBC: 3.14 MIL/uL — ABNORMAL LOW (ref 4.22–5.81)
RDW: 16.9 % — ABNORMAL HIGH (ref 11.5–15.5)
WBC: 6.4 10*3/uL (ref 4.0–10.5)
nRBC: 0 % (ref 0.0–0.2)

## 2020-03-08 LAB — BASIC METABOLIC PANEL
Anion gap: 20 — ABNORMAL HIGH (ref 5–15)
BUN: 98 mg/dL — ABNORMAL HIGH (ref 8–23)
CO2: 20 mmol/L — ABNORMAL LOW (ref 22–32)
Calcium: 8.8 mg/dL — ABNORMAL LOW (ref 8.9–10.3)
Chloride: 93 mmol/L — ABNORMAL LOW (ref 98–111)
Creatinine, Ser: 18.84 mg/dL — ABNORMAL HIGH (ref 0.61–1.24)
GFR, Estimated: 2 mL/min — ABNORMAL LOW (ref >60)
Glucose, Bld: 122 mg/dL — ABNORMAL HIGH (ref 70–99)
Potassium: 6.9 mmol/L (ref 3.5–5.1)
Sodium: 133 mmol/L — ABNORMAL LOW (ref 135–145)

## 2020-03-08 LAB — POTASSIUM
Potassium: 7.4 mmol/L (ref 3.5–5.1)
Potassium: 4.8 mmol/L (ref 3.5–5.1)

## 2020-03-08 LAB — CBG MONITORING, ED: Glucose-Capillary: 70 mg/dL (ref 70–99)

## 2020-03-08 LAB — SARS CORONAVIRUS 2 BY RT PCR (HOSPITAL ORDER, PERFORMED IN ~~LOC~~ HOSPITAL LAB): SARS Coronavirus 2: NEGATIVE

## 2020-03-08 LAB — HEPATITIS B SURFACE ANTIGEN: Hepatitis B Surface Ag: NONREACTIVE

## 2020-03-08 MED ORDER — DEXTROSE 50 % IV SOLN: 1.0000 | Freq: Once | INTRAVENOUS | Status: DC

## 2020-03-08 MED ORDER — DEXTROSE 10 % IV SOLN: Freq: Once | INTRAVENOUS | Status: DC

## 2020-03-08 MED ORDER — CALCIUM GLUCONATE 10 % IV SOLN
1.0000 g | Freq: Once | INTRAVENOUS | Status: AC
  Administered 2020-03-08: 1 g via INTRAVENOUS
  Filled 2020-03-08: qty 10

## 2020-03-08 MED ORDER — ALBUTEROL (5 MG/ML) CONTINUOUS INHALATION SOLN: 5.0000 mg/h | INHALATION_SOLUTION | Freq: Once | RESPIRATORY_TRACT | Status: DC

## 2020-03-08 MED ORDER — SODIUM ZIRCONIUM CYCLOSILICATE 10 G PO PACK
10.0000 g | PACK | Freq: Once | ORAL | Status: AC
  Administered 2020-03-08: 10 g via ORAL
  Filled 2020-03-08: qty 1

## 2020-03-08 MED ORDER — CHLORHEXIDINE GLUCONATE CLOTH 2 % EX PADS: 6.0000 | MEDICATED_PAD | Freq: Every day | CUTANEOUS | Status: DC

## 2020-03-08 MED ORDER — CALCIUM GLUCONATE 10 % IV SOLN: 1.0000 g | Freq: Once | INTRAVENOUS | Status: DC

## 2020-03-08 MED ORDER — SODIUM BICARBONATE 8.4 % IV SOLN: 50.0000 meq | Freq: Once | INTRAVENOUS | Status: DC

## 2020-03-08 MED ORDER — INSULIN ASPART 100 UNIT/ML IV SOLN: 5.0000 [IU] | Freq: Once | INTRAVENOUS | Status: DC

## 2020-03-08 MED ORDER — INSULIN ASPART 100 UNIT/ML ~~LOC~~ SOLN
4.0000 [IU] | Freq: Once | SUBCUTANEOUS | Status: AC
  Administered 2020-03-08: 4 [IU] via INTRAVENOUS

## 2020-03-08 MED ORDER — SODIUM BICARBONATE 8.4 % IV SOLN
50.0000 meq | Freq: Once | INTRAVENOUS | Status: AC
  Administered 2020-03-08: 50 meq via INTRAVENOUS
  Filled 2020-03-08: qty 50

## 2020-03-08 MED ORDER — DEXTROSE 50 % IV SOLN
25.0000 g | Freq: Once | INTRAVENOUS | Status: AC
  Administered 2020-03-08: 25 g via INTRAVENOUS
  Filled 2020-03-08: qty 50

## 2020-03-08 NOTE — ED Provider Notes (Signed)
Nephrology called me at 1527 regarding this patient in [place].   Discussed pt, weakness, missed dialysis x 2, K 6.9 - Dr Johnney Ou indicates will arrange emergent dialysis, but that it wont be immediate, so agrees w Ca, and requests hco3, d50, ins, lokelma also be given - ordered.   Covid swab also sent.   Discussed pt with [facility] ED[facility].      Lajean Saver, MD 03/08/20 1530

## 2020-03-08 NOTE — ED Provider Notes (Signed)
Patient is awaiting dialysis.  I am performing a recheck.  Patient has hyperkalemia.  Treatment was initiated with hyperkalemia oriented therapy.  Patient reports he feels a little short of breath.  He reports he also has COPD.  No chest pain.  He reports he has sleep apnea and sleeps with a pillow at home.  He denies any home oxygen use. Physical Exam  BP 137/86   Pulse 79   Temp 98.3 F (36.8 C) (Oral)   Resp 18   Ht '5\' 8"'$  (1.727 m)   Wt 112 kg   SpO2 95%   BMI 37.56 kg/m   Physical Exam Constitutional:      Comments: Patient is sleeping as I enter the room.  Oxygen saturations are high 90s.  Morbid central obesity.  Breathing pattern consistent with sleep apnea.  HENT:     Head: Normocephalic and atraumatic.     Mouth/Throat:     Pharynx: Oropharynx is clear.  Cardiovascular:     Rate and Rhythm: Normal rate and regular rhythm.  Pulmonary:     Comments: Mild increased work of breathing.  Breath sounds are very soft at the bases.  Cannot appreciate gross crackle. Abdominal:     Comments: Significant central obesity.  Soft and nontender.  Musculoskeletal:     Comments: 1-2+ edema of the feet and ankles  Skin:    General: Skin is warm and dry.  Neurological:     General: No focal deficit present.     Mental Status: He is oriented to person, place, and time.     Coordination: Coordination normal.  Psychiatric:        Mood and Affect: Mood normal.     ED Course/Procedures     Procedures  MDM  Patient is still awaiting dialysis.  At this time unknown time but orders placed for inpatient dialysis.  aproximally 4 hours earlier patient had Lokelma, calcium gluconate, sodium bicarb, insulin D50.  Will repeat potassium to continue monitoring.  Patient has sleep apnea but is not hypoxic.  He is awakening to a clear mental status.  Appearance is consistent with obesity hypoventilation.  Will add a albuterol therapy both for COPD and hyperkalemia.  Continue to await inpatient  dialysis.       Charlesetta Shanks, MD 03/08/20 [phone]

## 2020-03-08 NOTE — ED Triage Notes (Signed)
Pt lives at home alone and has missed his last 2 HD appointments (MWF).  Pt has not been feeling well and has had a couple of falls, injured left knee. Pt was 89%/RA and placed on 02/2L. Pt does not complain of fever, sob.  Pt has fistula in left arm.  CBG per ems 134, BP 148/72

## 2020-03-08 NOTE — ED Notes (Signed)
This RN told to hold patients medications per dialysis nurse.

## 2020-03-08 NOTE — ED Provider Notes (Signed)
[facility] EMERGENCY DEPARTMENT Provider Note   CSN: [id number] Arrival date & time: 03/08/20  1250     History Chief Complaint  Patient presents with  . Weakness    Daniel Whitney is a 65 y.o. male presenting from home for generalized fatigue after missing hemodialysis on Friday last week and yesterday.  Goes to hemodialysis on Monday Wednesday and Fridays for end-stage renal disease.  Has history of CHF, A. fib, CVA, diabetes.  He has been feeling down lately and did not make out of his house to go to dialysis.  He is feeling only some mild shortness of breath. Endorses muscle twitching. Denies any lower extremity swelling or abdominal distention.  No fevers or recent illness.  Patient's hyperkalemia was addressed while patient was in the waiting room, calcium gluconate was ordered by another ED physician.  Nephrology was also consulted and will be dialyzing him this evening.  Additional medications ordered including bicarb, insulin, dextrose, Lokelma.  The history is provided by the patient.       Past Medical History:  Diagnosis Date  . Allergic rhinitis 07/07/2014  . Anemia due to other cause 07/07/2014  . Arthritis    "all over"  . Atrial fibrillation ([place]) 12/30/2012  . CHF (congestive heart failure) ([place]) 12/30/2012  . Chronic lower back pain   . Diabetes mellitus with neuropathy ([place])   . Diabetic retinopathy ([place]) 12/30/2012   no medications  . Dysrhythmia    Afib  . ESRD (end stage renal disease) on dialysis ([place]) started in 2013   MWF; [facility]; [facility]  . GERD (gastroesophageal reflux disease)   . Glaucoma   . History of gout    "right big toe"  . Hyperlipidemia   . Hypertension   . Hypothyroid    01/17/16- no longer on meduication  . Morbid obesity ([place]) 12/30/2012  . Myocardial infarction ([place])    "I've had ~ 3; last one was in ~ 10/2013" (01/30/2014)  . OSA (obstructive sleep apnea) 12/30/2012   "lost weight; no longer needed  CPAP; retested said I needed it; didn't followup cause I was feeling fine" (01/30/2014)  . Pulmonary hypertension ([place]) 12/30/2012  . Refusal of blood transfusions as patient is Jehovah's Witness   . Stroke [facility]) ~ 2005; ~ 2005   "they were mild; I didn't even notice I'd had them"; denies residual on 01/30/2014  . Subdural hematoma (Oct 2018)    Coumadin and heparin placed on hold at dc- pt did not FU w/ NS as recommended after dc so not on warfarin when admitted 04/03/17    Patient Active Problem List   Diagnosis Date Noted  . Noncompliance of patient with renal dialysis ([place]) 01/11/2020  . Prolonged QT interval 02/15/2019  . Shock ([place]) 02/24/2018  . Hyperkalemia, diminished renal excretion 01/01/2018  . Syncope and collapse 04/03/2017  . History of CVA (cerebrovascular accident) 04/03/2017  . CKD (chronic kidney disease) stage V requiring chronic dialysis ([place]) 04/03/2017  . Hypothyroid 04/03/2017  . Refusal of blood transfusions as patient is Jehovah's Witness 04/03/2017  . Prediabetes-HgbA1c 6.2 04/03/2017  . History of subdural hematoma Oct 2018 04/03/2017  . 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 Chronic atrial fibrillation 04/03/2017  . Subdural hematoma ([place]) 11/29/2016  . Peripheral neuropathy 07/26/2016  . At risk for adverse drug reaction 07/03/2016  . Primary osteoarthritis of left knee 06/26/2016  . Postoperative anemia due to acute blood loss 01/27/2016  . S/P  knee replacement 01/24/2016  . Low back pain 02/09/2015  . Bilateral knee pain 01/27/2015  . Atypical atrial flutter ([place]) 01/24/2015  . Acute on chronic combined systolic and diastolic heart failure ([place]) 01/05/2015  . Hyperkalemia 11/08/2014  . Hypocalcemia 11/08/2014  . Recurrent falls 10/13/2014  . Venous stasis ulcer ([place]) 10/13/2014  . Hypoxia   . Leukocytosis   . Respiratory failure ([place])   . ARDS (adult respiratory distress syndrome) ([place])   . Encounter for  central line placement   . Cardiopulmonary arrest ([place])   . Compression fracture of L3 lumbar vertebra 08/04/2014  . Anemia of chronic renal failure, unspecified stage 07/07/2014  . Allergic rhinitis 07/07/2014  . Anemia of chronic disease 07/07/2014  . Right rotator cuff tear 04/27/2014  . Acute gouty arthritis 11/11/2013  . Hearing loss in right ear 11/11/2013  . Chronic pain syndrome 11/11/2013  . Right shoulder pain 11/11/2013  . Facial rash 11/01/2013  . Right otitis externa 10/27/2013  . Pulmonary HTN- PA 58 mmHg 04/16/13 09/17/2013  . Right hip pain 08/04/2013  . DOE (dyspnea on exertion) 06/18/2013  . Allergic rhinitis, cause unspecified 06/18/2013  . Carpal tunnel syndrome 06/06/2013  . Primary localized osteoarthrosis, lower leg 06/04/2013  . Preoperative cardiovascular examination 03/10/2013  . Preventative health care 12/30/2012  . AF (paroxysmal atrial fibrillation) ([place]) 12/30/2012  . CHF (congestive heart failure) ([place]) 12/30/2012  . Diabetic retinopathy ([place]) 12/30/2012  . Morbid obesity ([place]) 12/30/2012  . OSA (obstructive sleep apnea)- on C-pap 12/30/2012  . Bilateral hearing loss 12/30/2012  . Arthritis   . Glaucoma   . Diabetes mellitus with neuropathy ([place])   . Hypertension   . ESRD (end stage renal disease) on dialysis ([place])   . Stroke ([place])   . Hyperlipidemia     Past Surgical History:  Procedure Laterality Date  . AV FISTULA PLACEMENT Left ~ 10/2013   "forearm"  . CARDIAC CATHETERIZATION N/A 01/07/2015   Procedure: Left Heart Cath and Coronary Angiography;  Surgeon: Troy Sine, MD;  Location: [facility];  Service: Cardiovascular;  Laterality: N/A;  . CLOSED REDUCTION HIP DISLOCATION Right 1970's  . JOINT REPLACEMENT    . TOTAL HIP ARTHROPLASTY Right 1980  . TOTAL KNEE ARTHROPLASTY Right 01/24/2016   Procedure: TOTAL KNEE ARTHROPLASTY;  Surgeon: Dorna Leitz, MD;  Location: [facility];  Service: Orthopedics;  Laterality: Right;  . TOTAL KNEE  ARTHROPLASTY Left 06/26/2016   Procedure: TOTAL KNEE ARTHROPLASTY;  Surgeon: Dorna Leitz, MD;  Location: [facility];  Service: Orthopedics;  Laterality: Left;       Family History  Problem Relation Age of Onset  . Arthritis Other   . Heart disease Other   . Hyperlipidemia Other   . Hypertension Other   . Kidney disease Other   . Diabetes Other   . Stroke Other   . Diabetes Mother   . Cancer Father        bone cancer  . Heart disease Father   . Diabetes Sister   . Diabetes Brother   . Stroke Brother     Social History   Tobacco Use  . Smoking status: Never Smoker  . Smokeless tobacco: Never Used  Vaping Use  . Vaping Use: Never used  Substance Use Topics  . Alcohol use: Yes    Alcohol/week: 7.0 - 8.0 standard drinks    Types: 1 Cans of beer, 3 Shots of liquor, 3 - 4 Standard drinks or equivalent per week  Comment: 07/03/16 1 pint 2-3X/week  . Drug use: No    Home Medications Prior to Admission medications   Medication Sig Start Date End Date Taking? Authorizing Provider  amitriptyline (ELAVIL) 50 MG tablet Take 1 tablet (50 mg total) by mouth at bedtime. Patient not taking: Reported on 01/12/2020 02/28/18   Rai, Vernelle Emerald, MD  AURYXIA 1 GM 210 MG(Fe) tablet Take 210 mg by mouth 3 (three) times daily with meals. 11/27/19   [provider]  b complex-vitamin c-folic acid (NEPHRO-VITE) 0.8 MG TABS tablet Take 1 tablet by mouth daily.    [provider]  cinacalcet (SENSIPAR) 30 MG tablet Take 30 mg by mouth daily with supper. 12/23/17   [provider]  gabapentin (NEURONTIN) 100 MG capsule Take 2 capsules (200 mg total) by mouth at bedtime. 200 mg qam and 300 mg upon return from dialysis on M-W-F for neuropathy Patient taking differently: Take 100 mg by mouth at bedtime.  07/31/16   Medina-Vargas, Monina C, NP  simvastatin (ZOCOR) 20 MG tablet Take 1 tablet (20 mg total) by mouth daily. 01/12/20   Kayleen Memos, DO    Allergies    Tobacco  [tobacco] and Other  Review of Systems   Review of Systems  All other systems reviewed and are negative.   Physical Exam Updated Vital Signs BP (!) 162/93   Pulse 77   Temp 98.3 F (36.8 C) (Oral)   Resp 17   Ht '5\' 8"'$  (1.727 m)   Wt 112 kg   SpO2 99%   BMI 37.56 kg/m   Physical Exam Vitals and nursing note reviewed.  Constitutional:      General: He is not in acute distress.    Appearance: He is well-developed and well-nourished.  HENT:     Head: Normocephalic and atraumatic.  Eyes:     Conjunctiva/sclera: Conjunctivae normal.  Cardiovascular:     Rate and Rhythm: Normal rate and regular rhythm.     Comments: AV Fistula left forearm Pulmonary:     Effort: Pulmonary effort is normal.     Breath sounds: Rales (Fine rales bilateral bases) present.  Abdominal:     General: Bowel sounds are normal.     Palpations: Abdomen is soft.     Tenderness: There is no abdominal tenderness.  Musculoskeletal:     Comments: Chronic skin changes noted to bilateral lower extremities.  Palpable DP pulses.  Skin:    General: Skin is warm.  Neurological:     Mental Status: He is alert. Mental status is at baseline.  Psychiatric:        Mood and Affect: Mood and affect normal.        Behavior: Behavior normal.     ED Results / Procedures / Treatments   Labs (all labs ordered are listed, but only abnormal results are displayed) Labs Reviewed  BASIC METABOLIC PANEL - Abnormal; Notable for the following components:      Result Value   Sodium 133 (*)    Potassium 6.9 (*)    Chloride 93 (*)    CO2 20 (*)    Glucose, Bld 122 (*)    BUN 98 (*)    Creatinine, Ser 18.84 (*)    Calcium 8.8 (*)    GFR, Estimated 2 (*)    Anion gap 20 (*)    All other components within normal limits  CBC - Abnormal; Notable for the following components:   RBC 3.14 (*)    Hemoglobin  9.7 (*)    HCT 29.8 (*)    RDW 16.9 (*)    Platelets 108 (*)    All other components within normal limits  SARS  CORONAVIRUS 2 BY RT PCR [facility] ORDER, [facility])  CBG MONITORING, ED    EKG EKG Interpretation  Date/Time:  Tuesday March 08 2020 12:59:53 EST Ventricular Rate:  85 PR Interval:    QRS Duration: 108 QT Interval:  378 QTC Calculation: 449 R Axis:   -113 Text Interpretation: Atrial fibrillation Confirmed by Lajean Saver [phone]) on 03/08/2020 2:07:54 PM   Radiology No results found.  Procedures Procedures   Medications Ordered in ED Medications  Chlorhexidine Gluconate Cloth 2 % PADS 6 each (has no administration in time range)  calcium gluconate inj 10% (1 g) URGENT USE ONLY! (1 g Intravenous Given 03/08/20 1612)  sodium bicarbonate injection 50 mEq (50 mEq Intravenous Given 03/08/20 1612)  dextrose 50 % solution 25 g (25 g Intravenous Given 03/08/20 1612)  insulin aspart (novoLOG) injection 4 Units (4 Units Intravenous Given 03/08/20 1631)  sodium zirconium cyclosilicate (LOKELMA) packet 10 g (10 g Oral Given 03/08/20 1631)    ED Course  I have reviewed the triage vital signs and the nursing notes.  Pertinent labs & imaging results that were available during my care of the patient were reviewed by me and considered in my medical decision making (see chart for details).    MDM Rules/Calculators/A&P                          Patient here for fatigue, reports missing 2 days of hemodialysis, however per nephrology consult it appears he missed 4 treatments. Is noted to be hyperkalemic at 6.9.  Bicarb is low at 20.  BUN of 98 with creatinine of 18.8.  He is given calcium gluconate, sodium bicarb, dextrose, insulin, and Lokelma while awaiting urgent dialysis this evening.  No acute distress noted. Anticipated plan is dialysis and then discharge to home.  Oncoming ED provider made aware at shift change.   Final Clinical Impression(s) / ED Diagnoses Final diagnoses:  Hyperkalemia  Noncompliance of patient with renal dialysis [facility])    Rx / DC  Orders ED Discharge Orders    None       Jabrea Kallstrom, [place] N, PA-C 03/08/20 1817    Lorelle Gibbs, DO 03/10/20 0825

## 2020-03-08 NOTE — Consult Note (Signed)
[name] Renal Consultation Note    Indication for Consultation:  Management of ESRD/hemodialysis, anemia, hypertension/volume, and secondary hyperparathyroidism.  HPI: Daniel Whitney is a 65 y.o. male with PMH including ESRD on dialysis MWF, a.fib, HTN, MI 2015, CVA and OSA, who presents to the ED after missing dialysis. Pt reported "not feeling well," and recent fall injuring L knee. O2 sat 89% on RA on arrival. Labs with K+ 6.9, Cr 18.85, BUN 98, CO2 20, Hgb 9.7. Pt treated with calcium gluconate, dextrose, insulin, bicarb and lokelma in the ED.   Patient initially states he missed two dialysis treatments, however last recorded outpatient dialysis treatment was 02/26/20 (4 treatments missed), and he confirms this is correct. Pt reports he kept pushing off dialysis but finally felt sick enough that he needed to come to the ED. He states "I realized I can't beat this," and expresses remorse for missing dialysis.  He endorses lower extremity weakness, muscle twitching, nausea, and mild SOB. Denies CP, palpitations, and dizziness. No recent fevers/chills. He does report the top of his L foot has started looking slightly red but denies any pain or lesions at this time.    Past Medical History:  Diagnosis Date  . Allergic rhinitis 07/07/2014  . Anemia due to other cause 07/07/2014  . Arthritis    "all over"  . Atrial fibrillation ([place]) 12/30/2012  . CHF (congestive heart failure) ([place]) 12/30/2012  . Chronic lower back pain   . Diabetes mellitus with neuropathy ([place])   . Diabetic retinopathy ([place]) 12/30/2012   no medications  . Dysrhythmia    Afib  . ESRD (end stage renal disease) on dialysis ([place]) started in 2013   MWF; [facility]; [facility]  . GERD (gastroesophageal reflux disease)   . Glaucoma   . History of gout    "right big toe"  . Hyperlipidemia   . Hypertension   . Hypothyroid    01/17/16- no longer on meduication  . Morbid obesity ([place]) 12/30/2012  . Myocardial  infarction ([place])    "I've had ~ 3; last one was in ~ 10/2013" (01/30/2014)  . OSA (obstructive sleep apnea) 12/30/2012   "lost weight; no longer needed CPAP; retested said I needed it; didn't followup cause I was feeling fine" (01/30/2014)  . Pulmonary hypertension ([place]) 12/30/2012  . Refusal of blood transfusions as patient is Jehovah's Witness   . Stroke [facility]) ~ 2005; ~ 2005   "they were mild; I didn't even notice I'd had them"; denies residual on 01/30/2014  . Subdural hematoma (Oct 2018)    Coumadin and heparin placed on hold at dc- pt did not FU w/ NS as recommended after dc so not on warfarin when admitted 04/03/17   Past Surgical History:  Procedure Laterality Date  . AV FISTULA PLACEMENT Left ~ 10/2013   "forearm"  . CARDIAC CATHETERIZATION N/A 01/07/2015   Procedure: Left Heart Cath and Coronary Angiography;  Surgeon: Troy Sine, MD;  Location: [facility];  Service: Cardiovascular;  Laterality: N/A;  . CLOSED REDUCTION HIP DISLOCATION Right 1970's  . JOINT REPLACEMENT    . TOTAL HIP ARTHROPLASTY Right 1980  . TOTAL KNEE ARTHROPLASTY Right 01/24/2016   Procedure: TOTAL KNEE ARTHROPLASTY;  Surgeon: Dorna Leitz, MD;  Location: [facility];  Service: Orthopedics;  Laterality: Right;  . TOTAL KNEE ARTHROPLASTY Left 06/26/2016   Procedure: TOTAL KNEE ARTHROPLASTY;  Surgeon: Dorna Leitz, MD;  Location: [facility];  Service: Orthopedics;  Laterality: Left;   Family History  Problem Relation Age of Onset  . Arthritis Other   . Heart disease Other   . Hyperlipidemia Other   . Hypertension Other   . Kidney disease Other   . Diabetes Other   . Stroke Other   . Diabetes Mother   . Cancer Father        bone cancer  . Heart disease Father   . Diabetes Sister   . Diabetes Brother   . Stroke Brother    Social History:  reports that he has never smoked. He has never used smokeless tobacco. He reports current alcohol use of about 7.0 - 8.0 standard drinks of alcohol per week. He  reports that he does not use drugs.  ROS: As per HPI otherwise negative.  Physical Exam: Vitals:   03/08/20 1250 03/08/20 1447  BP: 140/79 139/81  Pulse: 90 77  Resp: 20 16  Temp: 98.9 F (37.2 C) 98.3 F (36.8 C)  TempSrc: Oral Oral  SpO2: 90% 100%  Weight: 112 kg   Height: '5\' 8"'$  (1.727 m)      General: Well developed, alert, in no acute distress. Head: Normocephalic, atraumatic, sclera non-icteric, mucus membranes are moist. Neck:  JVD not elevated. Lungs: Clear bilaterally to auscultation without wheezes, rales, or rhonchi. Breathing is unlabored on O2 via [place] Heart: RRR with normal S1, S2. No murmurs, rubs, or gallops appreciated. Abdomen: Soft, non-tender, non-distended with normoactive bowel sounds. No rebound/guarding. No obvious abdominal masses. Musculoskeletal:  Strength and tone appear normal for age. Lower extremities: No edema b/l lower extremities, mild erythema dorsum of L foot with no visible open lesions or ulcera Neuro: + asterixis. Alert and oriented x3 Psych:  Responds to questions appropriately with a normal affect. Dialysis Access: LUE AVF aneurysmal, + thrill and bruit  Allergies  Allergen Reactions  . Tobacco [Tobacco] Shortness Of Breath  . Other     Perfume & cologne-eyes itchy   Prior to Admission medications   Medication Sig Start Date End Date Taking? Authorizing Provider  amitriptyline (ELAVIL) 50 MG tablet Take 1 tablet (50 mg total) by mouth at bedtime. Patient not taking: Reported on 01/12/2020 02/28/18   Rai, Vernelle Emerald, MD  AURYXIA 1 GM 210 MG(Fe) tablet Take 210 mg by mouth 3 (three) times daily with meals. 11/27/19   [provider]  b complex-vitamin c-folic acid (NEPHRO-VITE) 0.8 MG TABS tablet Take 1 tablet by mouth daily.    [provider]  cinacalcet (SENSIPAR) 30 MG tablet Take 30 mg by mouth daily with supper. 12/23/17   [provider]  gabapentin (NEURONTIN) 100 MG capsule Take 2 capsules (200 mg total)  by mouth at bedtime. 200 mg qam and 300 mg upon return from dialysis on M-W-F for neuropathy Patient taking differently: Take 100 mg by mouth at bedtime.  07/31/16   Medina-Vargas, Monina C, NP  simvastatin (ZOCOR) 20 MG tablet Take 1 tablet (20 mg total) by mouth daily. 01/12/20   Kayleen Memos, DO   Current Facility-Administered Medications  Medication Dose Route Frequency Provider Last Rate Last Admin  . calcium gluconate inj 10% (1 g) URGENT USE ONLY!  1 g Intravenous Once Lajean Saver, MD      . dextrose 50 % solution 25 g  25 g Intravenous Once Lajean Saver, MD      . insulin aspart (novoLOG) injection 4 Units  4 Units Intravenous Once Lajean Saver, MD      . sodium bicarbonate injection 50 mEq  50 mEq  Intravenous Once Lajean Saver, MD      . sodium zirconium cyclosilicate (LOKELMA) packet 10 g  10 g Oral Once Lajean Saver, MD       Current Outpatient Medications  Medication Sig Dispense Refill  . amitriptyline (ELAVIL) 50 MG tablet Take 1 tablet (50 mg total) by mouth at bedtime. (Patient not taking: Reported on 01/12/2020) 30 tablet 0  . AURYXIA 1 GM 210 MG(Fe) tablet Take 210 mg by mouth 3 (three) times daily with meals.    Marland Kitchen b complex-vitamin c-folic acid (NEPHRO-VITE) 0.8 MG TABS tablet Take 1 tablet by mouth daily.    . cinacalcet (SENSIPAR) 30 MG tablet Take 30 mg by mouth daily with supper.  6  . gabapentin (NEURONTIN) 100 MG capsule Take 2 capsules (200 mg total) by mouth at bedtime. 200 mg qam and 300 mg upon return from dialysis on M-W-F for neuropathy (Patient taking differently: Take 100 mg by mouth at bedtime. ) 60 capsule 0  . simvastatin (ZOCOR) 20 MG tablet Take 1 tablet (20 mg total) by mouth daily. 30 tablet 0   Labs: Basic Metabolic Panel: Recent Labs  Lab 03/08/20 1300  NA 133*  K 6.9*  CL 93*  CO2 20*  GLUCOSE 122*  BUN 98*  CREATININE 18.84*  CALCIUM 8.8*   CBC: Recent Labs  Lab 03/08/20 1300  WBC 6.4  HGB 9.7*  HCT 29.8*  MCV 94.9  PLT  108*    Dialysis Orders:  Center: [facility] on MWF. 180NRe, Time: 4 hours 15 min, BFR 450, DFR Auto 1.5, EDW 104.5kg, 2K/ 3.5Ca, AVF 15g, heparin 5000 unit bolus mircera 150 mcg IVP q 2 weeks- last dose 02/17/20  Assessment/Plan: 1.  Hyperkalemia: Due to missing 4 dialysis treatments. K+ 6.9. Medically managed in the ED as above. Pt will need urgent dialysis tonight. Unsure exactly when this will be due to very high patient census and staffing shortage, will ask dialysis charge RN to prioritize this patient.  2.  ESRD:  Missed >1 week dialysis. Will plan for HD tonight as above. Slightly decreased BFR as patient has missed 4 treatments and has uremic symptoms. Strongly encouraged patient to become more compliant with outpatient dialysis.  3.  Hypertension/volume: Mild SOB, BP mildly elevated. Reports he has been taking his "heart medicines." 7.5kg over his EDW, will titrate volume down as tolerated.  4.  Anemia: Hgb 9.7. Overdue for ESA, ordered with HD today.  5.  Metabolic bone disease: Ca 8.8. Reports he has been taking his binders, continue here. Follow phos.  6.  Nutrition:  Reinforced renal diet/fluid restrictions.  7. Foot discoloration: mild erythema of R foot, no S&S of sepsis, will defer to primary  Anice Paganini, PA-C 03/08/2020, 3:31 PM  [facility] Pager: [phone]

## 2020-03-09 NOTE — ED Notes (Signed)
Patient up for discharge. Per charge, Pt to sit in waiting room until patients ride can arrive. Patient alert and oriented. Pt in no distress at this time. This RN attempted to help get patient home, but unsucessful. PTAR not available due to patient not having keys to his house. Patient wheeled into waiting room and provided a sandwich bag.

## 2020-03-09 NOTE — Discharge Instructions (Addendum)
1.  Resume all of your regular medications and dialysis schedule. 2.  Follow-up with your doctor for recheck as soon as possible.

## 2020-03-09 NOTE — ED Provider Notes (Signed)
Patient has returned from dialysis.  He states he feels amazing.  He has no complaints at this time including shortness of breath, chest pain, nausea, vomiting, or abdominal pain.  He is currently on supplemental oxygen.  He does not wear home oxygen. His only complaint is that his back is itching, and he can't reach the area to scratch it.   Physical Exam  BP (!) 149/87   Pulse 84   Temp (!) 97.5 F (36.4 C)   Resp 18   Ht '5\' 8"'$  (1.727 m)   Wt 112 kg   SpO2 100%   BMI 37.56 kg/m   Physical Exam Vitals and nursing note reviewed.  Constitutional:      General: He is not in acute distress.    Appearance: He is well-developed and well-nourished. He is not ill-appearing, toxic-appearing or diaphoretic.     Comments: Nasal cannula is removed, patient maintains oxygen saturation levels of 96 to 98% on room air. Pleasant, well-appearing.  HENT:     Head: Normocephalic and atraumatic.  Pulmonary:     Effort: Pulmonary effort is normal.     Breath sounds: No stridor. No wheezing, rhonchi or rales.     Comments: Speaks in complete, fluent sentences.  No increased work of breathing.  Lungs are clear to auscultation bilaterally. Abdominal:     General: There is distension.     Comments: Abdomen is distended, but soft and nontender.  Musculoskeletal:     Cervical back: Neck supple.     Right lower leg: No edema.     Left lower leg: No edema.  Neurological:     Mental Status: He is alert and oriented to person, place, and time.     Cranial Nerves: No cranial nerve deficit.  Psychiatric:        Mood and Affect: Mood and affect normal.        Behavior: Behavior normal.     ED Course/Procedures     Procedures  MDM  65 year old male who returns to the ER after emergent dialysis.  Vital signs are stable.  He was returned on supplemental oxygen, but this was removed and he was monitored for several minutes at bedside with no hypoxia.  He does have a history of sleep apnea, and I question  if he was placed on supplemental oxygen for hypoxia while sleeping, but I do not see this documented during his dialysis session.  However, he has no hypoxia at this time.  He has no complaints.  Patient is hemodynamically stable and in no acute distress.  Safe for discharge to home.  He was strongly encouraged to make sure that he is compliant with his outpatient dialysis appointments.       Joline Maxcy A, PA-C 03/09/20 0057    Veryl Speak, MD 03/09/20 [phone]

## 2020-03-10 DIAGNOSIS — I132 Hypertensive heart and chronic kidney disease with heart failure and with stage 5 chronic kidney disease, or end stage renal disease: Secondary | ICD-10-CM | POA: Diagnosis not present

## 2020-03-10 DIAGNOSIS — G8929 Other chronic pain: Secondary | ICD-10-CM | POA: Diagnosis not present

## 2020-03-10 DIAGNOSIS — M5459 Other low back pain: Secondary | ICD-10-CM | POA: Diagnosis not present

## 2020-03-10 DIAGNOSIS — I480 Paroxysmal atrial fibrillation: Secondary | ICD-10-CM | POA: Diagnosis not present

## 2020-03-10 DIAGNOSIS — I2510 Atherosclerotic heart disease of native coronary artery without angina pectoris: Secondary | ICD-10-CM | POA: Diagnosis not present

## 2020-03-10 DIAGNOSIS — N186 End stage renal disease: Secondary | ICD-10-CM | POA: Diagnosis not present

## 2020-03-10 DIAGNOSIS — E1122 Type 2 diabetes mellitus with diabetic chronic kidney disease: Secondary | ICD-10-CM | POA: Diagnosis not present

## 2020-03-10 DIAGNOSIS — E1142 Type 2 diabetes mellitus with diabetic polyneuropathy: Secondary | ICD-10-CM | POA: Diagnosis not present

## 2020-03-10 DIAGNOSIS — I509 Heart failure, unspecified: Secondary | ICD-10-CM | POA: Diagnosis not present

## 2020-03-11 DIAGNOSIS — N186 End stage renal disease: Secondary | ICD-10-CM | POA: Diagnosis not present

## 2020-03-11 DIAGNOSIS — N2581 Secondary hyperparathyroidism of renal origin: Secondary | ICD-10-CM | POA: Diagnosis not present

## 2020-03-11 DIAGNOSIS — Z992 Dependence on renal dialysis: Secondary | ICD-10-CM | POA: Diagnosis not present

## 2020-03-14 DIAGNOSIS — I120 Hypertensive chronic kidney disease with stage 5 chronic kidney disease or end stage renal disease: Secondary | ICD-10-CM | POA: Diagnosis not present

## 2020-03-14 DIAGNOSIS — Z992 Dependence on renal dialysis: Secondary | ICD-10-CM | POA: Diagnosis not present

## 2020-03-14 DIAGNOSIS — N186 End stage renal disease: Secondary | ICD-10-CM | POA: Diagnosis not present

## 2020-03-15 ENCOUNTER — Other Ambulatory Visit: Payer: Self-pay

## 2020-03-15 NOTE — Patient Outreach (Signed)
[facility]) Care Management  03/15/2020  Daniel Whitney 1955-09-22 [id number]   Telephone Assessment Quarterly Call   Outreach attempt to patient. Spoke with patient who requested a call back later today or another day.   Plan: RN CM will make outreach attempt to patient at patient's specified request time frame.   Enzo Montgomery, RN,BSN,CCM [place] Management Telephonic Care Management Coordinator Direct Phone: [phone] Toll Free: [phone] Fax: [phone]

## 2020-03-15 NOTE — Patient Outreach (Signed)
[facility]) Care Management  03/15/2020  Wells Moring 09-14-1955 [id number]   Telephone Assessment   Outreach attempt back to patient per his request. No answer at present.      Plan: RN CM will make quarterly outreach attempt to patient within the month of May if no return call from patient.   Enzo Montgomery, RN,BSN,CCM [place] Management Telephonic Care Management Coordinator Direct Phone: [phone] Toll Free: [phone] Fax: [phone]

## 2020-03-16 DIAGNOSIS — N186 End stage renal disease: Secondary | ICD-10-CM | POA: Diagnosis not present

## 2020-03-16 DIAGNOSIS — N2581 Secondary hyperparathyroidism of renal origin: Secondary | ICD-10-CM | POA: Diagnosis not present

## 2020-03-16 DIAGNOSIS — Z992 Dependence on renal dialysis: Secondary | ICD-10-CM | POA: Diagnosis not present

## 2020-03-18 DIAGNOSIS — N2581 Secondary hyperparathyroidism of renal origin: Secondary | ICD-10-CM | POA: Diagnosis not present

## 2020-03-18 DIAGNOSIS — N186 End stage renal disease: Secondary | ICD-10-CM | POA: Diagnosis not present

## 2020-03-18 DIAGNOSIS — Z992 Dependence on renal dialysis: Secondary | ICD-10-CM | POA: Diagnosis not present

## 2020-03-19 DIAGNOSIS — G8929 Other chronic pain: Secondary | ICD-10-CM | POA: Diagnosis not present

## 2020-03-19 DIAGNOSIS — N186 End stage renal disease: Secondary | ICD-10-CM | POA: Diagnosis not present

## 2020-03-19 DIAGNOSIS — M5459 Other low back pain: Secondary | ICD-10-CM | POA: Diagnosis not present

## 2020-03-19 DIAGNOSIS — I132 Hypertensive heart and chronic kidney disease with heart failure and with stage 5 chronic kidney disease, or end stage renal disease: Secondary | ICD-10-CM | POA: Diagnosis not present

## 2020-03-19 DIAGNOSIS — E1122 Type 2 diabetes mellitus with diabetic chronic kidney disease: Secondary | ICD-10-CM | POA: Diagnosis not present

## 2020-03-19 DIAGNOSIS — I480 Paroxysmal atrial fibrillation: Secondary | ICD-10-CM | POA: Diagnosis not present

## 2020-03-19 DIAGNOSIS — E1142 Type 2 diabetes mellitus with diabetic polyneuropathy: Secondary | ICD-10-CM | POA: Diagnosis not present

## 2020-03-19 DIAGNOSIS — I509 Heart failure, unspecified: Secondary | ICD-10-CM | POA: Diagnosis not present

## 2020-03-19 DIAGNOSIS — I2510 Atherosclerotic heart disease of native coronary artery without angina pectoris: Secondary | ICD-10-CM | POA: Diagnosis not present

## 2020-03-21 DIAGNOSIS — E1122 Type 2 diabetes mellitus with diabetic chronic kidney disease: Secondary | ICD-10-CM | POA: Diagnosis not present

## 2020-03-21 DIAGNOSIS — Z992 Dependence on renal dialysis: Secondary | ICD-10-CM | POA: Diagnosis not present

## 2020-03-21 DIAGNOSIS — I2510 Atherosclerotic heart disease of native coronary artery without angina pectoris: Secondary | ICD-10-CM | POA: Diagnosis not present

## 2020-03-21 DIAGNOSIS — I509 Heart failure, unspecified: Secondary | ICD-10-CM | POA: Diagnosis not present

## 2020-03-21 DIAGNOSIS — G8929 Other chronic pain: Secondary | ICD-10-CM | POA: Diagnosis not present

## 2020-03-21 DIAGNOSIS — N2581 Secondary hyperparathyroidism of renal origin: Secondary | ICD-10-CM | POA: Diagnosis not present

## 2020-03-21 DIAGNOSIS — I132 Hypertensive heart and chronic kidney disease with heart failure and with stage 5 chronic kidney disease, or end stage renal disease: Secondary | ICD-10-CM | POA: Diagnosis not present

## 2020-03-21 DIAGNOSIS — M5459 Other low back pain: Secondary | ICD-10-CM | POA: Diagnosis not present

## 2020-03-21 DIAGNOSIS — E1142 Type 2 diabetes mellitus with diabetic polyneuropathy: Secondary | ICD-10-CM | POA: Diagnosis not present

## 2020-03-21 DIAGNOSIS — N186 End stage renal disease: Secondary | ICD-10-CM | POA: Diagnosis not present

## 2020-03-21 DIAGNOSIS — I480 Paroxysmal atrial fibrillation: Secondary | ICD-10-CM | POA: Diagnosis not present

## 2020-03-23 DIAGNOSIS — Z992 Dependence on renal dialysis: Secondary | ICD-10-CM | POA: Diagnosis not present

## 2020-03-23 DIAGNOSIS — N2581 Secondary hyperparathyroidism of renal origin: Secondary | ICD-10-CM | POA: Diagnosis not present

## 2020-03-23 DIAGNOSIS — N186 End stage renal disease: Secondary | ICD-10-CM | POA: Diagnosis not present

## 2020-03-29 ENCOUNTER — Ambulatory Visit: Payer: Self-pay

## 2020-04-01 DIAGNOSIS — E1122 Type 2 diabetes mellitus with diabetic chronic kidney disease: Secondary | ICD-10-CM | POA: Diagnosis not present

## 2020-04-01 DIAGNOSIS — I509 Heart failure, unspecified: Secondary | ICD-10-CM | POA: Diagnosis not present

## 2020-04-01 DIAGNOSIS — I480 Paroxysmal atrial fibrillation: Secondary | ICD-10-CM | POA: Diagnosis not present

## 2020-04-01 DIAGNOSIS — N186 End stage renal disease: Secondary | ICD-10-CM | POA: Diagnosis not present

## 2020-04-01 DIAGNOSIS — G8929 Other chronic pain: Secondary | ICD-10-CM | POA: Diagnosis not present

## 2020-04-01 DIAGNOSIS — I132 Hypertensive heart and chronic kidney disease with heart failure and with stage 5 chronic kidney disease, or end stage renal disease: Secondary | ICD-10-CM | POA: Diagnosis not present

## 2020-04-01 DIAGNOSIS — E1142 Type 2 diabetes mellitus with diabetic polyneuropathy: Secondary | ICD-10-CM | POA: Diagnosis not present

## 2020-04-01 DIAGNOSIS — M5459 Other low back pain: Secondary | ICD-10-CM | POA: Diagnosis not present

## 2020-04-01 DIAGNOSIS — I2510 Atherosclerotic heart disease of native coronary artery without angina pectoris: Secondary | ICD-10-CM | POA: Diagnosis not present

## 2020-04-01 DIAGNOSIS — R6889 Other general symptoms and signs: Secondary | ICD-10-CM | POA: Diagnosis not present

## 2020-04-04 DIAGNOSIS — N186 End stage renal disease: Secondary | ICD-10-CM | POA: Diagnosis not present

## 2020-04-04 DIAGNOSIS — N2581 Secondary hyperparathyroidism of renal origin: Secondary | ICD-10-CM | POA: Diagnosis not present

## 2020-04-04 DIAGNOSIS — Z992 Dependence on renal dialysis: Secondary | ICD-10-CM | POA: Diagnosis not present

## 2020-04-06 DIAGNOSIS — R6889 Other general symptoms and signs: Secondary | ICD-10-CM | POA: Diagnosis not present

## 2020-04-07 DIAGNOSIS — M5459 Other low back pain: Secondary | ICD-10-CM | POA: Diagnosis not present

## 2020-04-07 DIAGNOSIS — I509 Heart failure, unspecified: Secondary | ICD-10-CM | POA: Diagnosis not present

## 2020-04-07 DIAGNOSIS — I480 Paroxysmal atrial fibrillation: Secondary | ICD-10-CM | POA: Diagnosis not present

## 2020-04-07 DIAGNOSIS — E1142 Type 2 diabetes mellitus with diabetic polyneuropathy: Secondary | ICD-10-CM | POA: Diagnosis not present

## 2020-04-07 DIAGNOSIS — I132 Hypertensive heart and chronic kidney disease with heart failure and with stage 5 chronic kidney disease, or end stage renal disease: Secondary | ICD-10-CM | POA: Diagnosis not present

## 2020-04-07 DIAGNOSIS — N186 End stage renal disease: Secondary | ICD-10-CM | POA: Diagnosis not present

## 2020-04-07 DIAGNOSIS — G8929 Other chronic pain: Secondary | ICD-10-CM | POA: Diagnosis not present

## 2020-04-07 DIAGNOSIS — E1122 Type 2 diabetes mellitus with diabetic chronic kidney disease: Secondary | ICD-10-CM | POA: Diagnosis not present

## 2020-04-07 DIAGNOSIS — I2510 Atherosclerotic heart disease of native coronary artery without angina pectoris: Secondary | ICD-10-CM | POA: Diagnosis not present

## 2020-04-08 DIAGNOSIS — Z9181 History of falling: Secondary | ICD-10-CM | POA: Diagnosis not present

## 2020-04-08 DIAGNOSIS — E1142 Type 2 diabetes mellitus with diabetic polyneuropathy: Secondary | ICD-10-CM | POA: Diagnosis not present

## 2020-04-08 DIAGNOSIS — R6889 Other general symptoms and signs: Secondary | ICD-10-CM | POA: Diagnosis not present

## 2020-04-11 DIAGNOSIS — N186 End stage renal disease: Secondary | ICD-10-CM | POA: Diagnosis not present

## 2020-04-11 DIAGNOSIS — Z992 Dependence on renal dialysis: Secondary | ICD-10-CM | POA: Diagnosis not present

## 2020-04-11 DIAGNOSIS — I120 Hypertensive chronic kidney disease with stage 5 chronic kidney disease or end stage renal disease: Secondary | ICD-10-CM | POA: Diagnosis not present

## 2020-04-11 DIAGNOSIS — N2581 Secondary hyperparathyroidism of renal origin: Secondary | ICD-10-CM | POA: Diagnosis not present

## 2020-04-11 DIAGNOSIS — R6889 Other general symptoms and signs: Secondary | ICD-10-CM | POA: Diagnosis not present

## 2020-04-12 DIAGNOSIS — E1122 Type 2 diabetes mellitus with diabetic chronic kidney disease: Secondary | ICD-10-CM | POA: Diagnosis not present

## 2020-04-12 DIAGNOSIS — M5459 Other low back pain: Secondary | ICD-10-CM | POA: Diagnosis not present

## 2020-04-12 DIAGNOSIS — N186 End stage renal disease: Secondary | ICD-10-CM | POA: Diagnosis not present

## 2020-04-12 DIAGNOSIS — I480 Paroxysmal atrial fibrillation: Secondary | ICD-10-CM | POA: Diagnosis not present

## 2020-04-12 DIAGNOSIS — E1142 Type 2 diabetes mellitus with diabetic polyneuropathy: Secondary | ICD-10-CM | POA: Diagnosis not present

## 2020-04-12 DIAGNOSIS — G8929 Other chronic pain: Secondary | ICD-10-CM | POA: Diagnosis not present

## 2020-04-12 DIAGNOSIS — I509 Heart failure, unspecified: Secondary | ICD-10-CM | POA: Diagnosis not present

## 2020-04-12 DIAGNOSIS — I132 Hypertensive heart and chronic kidney disease with heart failure and with stage 5 chronic kidney disease, or end stage renal disease: Secondary | ICD-10-CM | POA: Diagnosis not present

## 2020-04-12 DIAGNOSIS — I2510 Atherosclerotic heart disease of native coronary artery without angina pectoris: Secondary | ICD-10-CM | POA: Diagnosis not present

## 2020-04-13 ENCOUNTER — Encounter: Payer: Self-pay | Admitting: General Practice

## 2020-04-13 DIAGNOSIS — R6889 Other general symptoms and signs: Secondary | ICD-10-CM | POA: Diagnosis not present

## 2020-04-13 DIAGNOSIS — Z992 Dependence on renal dialysis: Secondary | ICD-10-CM | POA: Diagnosis not present

## 2020-04-13 DIAGNOSIS — N2581 Secondary hyperparathyroidism of renal origin: Secondary | ICD-10-CM | POA: Diagnosis not present

## 2020-04-13 DIAGNOSIS — N186 End stage renal disease: Secondary | ICD-10-CM | POA: Diagnosis not present

## 2020-04-22 ENCOUNTER — Emergency Department (HOSPITAL_BASED_OUTPATIENT_CLINIC_OR_DEPARTMENT_OTHER): Payer: Medicare HMO

## 2020-04-22 ENCOUNTER — Encounter (HOSPITAL_COMMUNITY): Payer: Self-pay

## 2020-04-22 ENCOUNTER — Other Ambulatory Visit: Payer: Self-pay

## 2020-04-22 ENCOUNTER — Emergency Department (HOSPITAL_COMMUNITY): Payer: Medicare HMO

## 2020-04-22 ENCOUNTER — Emergency Department (HOSPITAL_COMMUNITY)
Admission: EM | Admit: 2020-04-22 | Discharge: 2020-04-23 | Disposition: A | Discharge status: Home / Self Care | Payer: Medicare HMO | Attending: Emergency Medicine | Admitting: Emergency Medicine

## 2020-04-22 DIAGNOSIS — I509 Heart failure, unspecified: Secondary | ICD-10-CM | POA: Diagnosis not present

## 2020-04-22 DIAGNOSIS — Z992 Dependence on renal dialysis: Secondary | ICD-10-CM | POA: Diagnosis not present

## 2020-04-22 DIAGNOSIS — M79604 Pain in right leg: Secondary | ICD-10-CM

## 2020-04-22 DIAGNOSIS — N186 End stage renal disease: Secondary | ICD-10-CM | POA: Insufficient documentation

## 2020-04-22 DIAGNOSIS — Z96641 Presence of right artificial hip joint: Secondary | ICD-10-CM | POA: Insufficient documentation

## 2020-04-22 DIAGNOSIS — W01198A Fall on same level from slipping, tripping and stumbling with subsequent striking against other object, initial encounter: Secondary | ICD-10-CM | POA: Diagnosis not present

## 2020-04-22 DIAGNOSIS — I132 Hypertensive heart and chronic kidney disease with heart failure and with stage 5 chronic kidney disease, or end stage renal disease: Secondary | ICD-10-CM | POA: Diagnosis not present

## 2020-04-22 DIAGNOSIS — Z20822 Contact with and (suspected) exposure to covid-19: Secondary | ICD-10-CM | POA: Insufficient documentation

## 2020-04-22 DIAGNOSIS — M25561 Pain in right knee: Secondary | ICD-10-CM | POA: Insufficient documentation

## 2020-04-22 DIAGNOSIS — Z79899 Other long term (current) drug therapy: Secondary | ICD-10-CM | POA: Insufficient documentation

## 2020-04-22 DIAGNOSIS — E1142 Type 2 diabetes mellitus with diabetic polyneuropathy: Secondary | ICD-10-CM | POA: Insufficient documentation

## 2020-04-22 DIAGNOSIS — S80919A Unspecified superficial injury of unspecified knee, initial encounter: Secondary | ICD-10-CM | POA: Diagnosis not present

## 2020-04-22 DIAGNOSIS — W19XXXA Unspecified fall, initial encounter: Secondary | ICD-10-CM

## 2020-04-22 DIAGNOSIS — E875 Hyperkalemia: Secondary | ICD-10-CM | POA: Insufficient documentation

## 2020-04-22 DIAGNOSIS — Z96653 Presence of artificial knee joint, bilateral: Secondary | ICD-10-CM | POA: Diagnosis not present

## 2020-04-22 DIAGNOSIS — E1122 Type 2 diabetes mellitus with diabetic chronic kidney disease: Secondary | ICD-10-CM | POA: Insufficient documentation

## 2020-04-22 DIAGNOSIS — Y92009 Unspecified place in unspecified non-institutional (private) residence as the place of occurrence of the external cause: Secondary | ICD-10-CM | POA: Diagnosis not present

## 2020-04-22 DIAGNOSIS — E11319 Type 2 diabetes mellitus with unspecified diabetic retinopathy without macular edema: Secondary | ICD-10-CM | POA: Insufficient documentation

## 2020-04-22 DIAGNOSIS — S8991XA Unspecified injury of right lower leg, initial encounter: Secondary | ICD-10-CM | POA: Diagnosis not present

## 2020-04-22 DIAGNOSIS — I471 Supraventricular tachycardia: Secondary | ICD-10-CM | POA: Diagnosis not present

## 2020-04-22 DIAGNOSIS — I4891 Unspecified atrial fibrillation: Secondary | ICD-10-CM | POA: Diagnosis not present

## 2020-04-22 DIAGNOSIS — R55 Syncope and collapse: Secondary | ICD-10-CM | POA: Insufficient documentation

## 2020-04-22 DIAGNOSIS — M7989 Other specified soft tissue disorders: Secondary | ICD-10-CM | POA: Diagnosis not present

## 2020-04-22 LAB — CBC
HCT: 33.8 % — ABNORMAL LOW (ref 39.0–52.0)
HCT: 30.2 % — ABNORMAL LOW (ref 39.0–52.0)
Hemoglobin: 10.8 g/dL — ABNORMAL LOW (ref 13.0–17.0)
Hemoglobin: 9.7 g/dL — ABNORMAL LOW (ref 13.0–17.0)
MCH: 29.8 pg (ref 26.0–34.0)
MCH: 29.4 pg (ref 26.0–34.0)
MCHC: 32 g/dL (ref 30.0–36.0)
MCHC: 32.1 g/dL (ref 30.0–36.0)
MCV: 93.4 fL (ref 80.0–100.0)
MCV: 91.5 fL (ref 80.0–100.0)
Platelets: 172 10*3/uL (ref 150–400)
Platelets: 178 10*3/uL (ref 150–400)
RBC: 3.62 MIL/uL — ABNORMAL LOW (ref 4.22–5.81)
RBC: 3.3 MIL/uL — ABNORMAL LOW (ref 4.22–5.81)
RDW: 17.9 % — ABNORMAL HIGH (ref 11.5–15.5)
RDW: 17.7 % — ABNORMAL HIGH (ref 11.5–15.5)
WBC: 8.4 10*3/uL (ref 4.0–10.5)
WBC: 7.6 10*3/uL (ref 4.0–10.5)
nRBC: 0 % (ref 0.0–0.2)
nRBC: 0 % (ref 0.0–0.2)

## 2020-04-22 LAB — RESP PANEL BY RT-PCR (FLU A&B, COVID) ARPGX2
Influenza A by PCR: NEGATIVE
Influenza B by PCR: NEGATIVE
SARS Coronavirus 2 by RT PCR: NEGATIVE

## 2020-04-22 LAB — BASIC METABOLIC PANEL
Anion gap: 19 — ABNORMAL HIGH (ref 5–15)
BUN: 105 mg/dL — ABNORMAL HIGH (ref 8–23)
CO2: 17 mmol/L — ABNORMAL LOW (ref 22–32)
Calcium: 9.2 mg/dL (ref 8.9–10.3)
Chloride: 95 mmol/L — ABNORMAL LOW (ref 98–111)
Creatinine, Ser: 17.29 mg/dL — ABNORMAL HIGH (ref 0.61–1.24)
GFR, Estimated: 3 mL/min — ABNORMAL LOW (ref >60)
Glucose, Bld: 107 mg/dL — ABNORMAL HIGH (ref 70–99)
Potassium: 6.5 mmol/L (ref 3.5–5.1)
Sodium: 131 mmol/L — ABNORMAL LOW (ref 135–145)

## 2020-04-22 LAB — I-STAT CHEM 8, ED
BUN: 103 mg/dL — ABNORMAL HIGH (ref 8–23)
Calcium, Ion: 0.86 mmol/L — CL (ref 1.15–1.40)
Chloride: 103 mmol/L (ref 98–111)
Creatinine, Ser: >18 mg/dL — ABNORMAL HIGH (ref 0.61–1.24)
Glucose, Bld: 101 mg/dL — ABNORMAL HIGH (ref 70–99)
HCT: 37 % — ABNORMAL LOW (ref 39.0–52.0)
Hemoglobin: 12.6 g/dL — ABNORMAL LOW (ref 13.0–17.0)
Potassium: 6.5 mmol/L (ref 3.5–5.1)
Sodium: 129 mmol/L — ABNORMAL LOW (ref 135–145)
TCO2: 18 mmol/L — ABNORMAL LOW (ref 22–32)

## 2020-04-22 LAB — MAGNESIUM: Magnesium: 2.4 mg/dL (ref 1.7–2.4)

## 2020-04-22 LAB — HEPATIC FUNCTION PANEL
ALT: 15 U/L (ref 0–44)
AST: 11 U/L — ABNORMAL LOW (ref 15–41)
Albumin: 3.3 g/dL — ABNORMAL LOW (ref 3.5–5.0)
Alkaline Phosphatase: 187 U/L — ABNORMAL HIGH (ref 38–126)
Bilirubin, Direct: 0.2 mg/dL (ref 0.0–0.2)
Indirect Bilirubin: 0.8 mg/dL (ref 0.3–0.9)
Total Bilirubin: 1 mg/dL (ref 0.3–1.2)
Total Protein: 8 g/dL (ref 6.5–8.1)

## 2020-04-22 LAB — HEPATITIS B SURFACE ANTIGEN: Hepatitis B Surface Ag: NONREACTIVE

## 2020-04-22 LAB — CBG MONITORING, ED: Glucose-Capillary: 92 mg/dL (ref 70–99)

## 2020-04-22 MED ORDER — LIDOCAINE-PRILOCAINE 2.5-2.5 % EX CREA: 1.0000 "application " | TOPICAL_CREAM | CUTANEOUS | Status: DC | PRN

## 2020-04-22 MED ORDER — HEPARIN SODIUM (PORCINE) 1000 UNIT/ML DIALYSIS
1000.0000 [IU] | INTRAMUSCULAR | Status: DC | PRN
  Filled 2020-04-22: qty 1

## 2020-04-22 MED ORDER — ALTEPLASE 2 MG IJ SOLR: 2.0000 mg | Freq: Once | INTRAMUSCULAR | Status: DC | PRN

## 2020-04-22 MED ORDER — SODIUM CHLORIDE 0.9 % IV SOLN: 100.0000 mL | INTRAVENOUS | Status: DC | PRN

## 2020-04-22 MED ORDER — HEPARIN SODIUM (PORCINE) 1000 UNIT/ML DIALYSIS
5000.0000 [IU] | Freq: Once | INTRAMUSCULAR | Status: DC
  Filled 2020-04-22: qty 5

## 2020-04-22 MED ORDER — INSULIN ASPART 100 UNIT/ML IV SOLN
5.0000 [IU] | Freq: Once | INTRAVENOUS | Status: AC
  Administered 2020-04-22: 5 [IU] via INTRAVENOUS

## 2020-04-22 MED ORDER — CALCIUM GLUCONATE-NACL 1-0.675 GM/50ML-% IV SOLN
1.0000 g | Freq: Once | INTRAVENOUS | Status: AC
  Administered 2020-04-22: 1000 mg via INTRAVENOUS
  Filled 2020-04-22: qty 50

## 2020-04-22 MED ORDER — SODIUM ZIRCONIUM CYCLOSILICATE 10 G PO PACK
10.0000 g | PACK | Freq: Once | ORAL | Status: AC
  Administered 2020-04-22: 10 g via ORAL
  Filled 2020-04-22: qty 1

## 2020-04-22 MED ORDER — PENTAFLUOROPROP-TETRAFLUOROETH EX AERO: 1.0000 "application " | INHALATION_SPRAY | CUTANEOUS | Status: DC | PRN

## 2020-04-22 MED ORDER — CHLORHEXIDINE GLUCONATE CLOTH 2 % EX PADS: 6.0000 | MEDICATED_PAD | Freq: Every day | CUTANEOUS | Status: DC

## 2020-04-22 MED ORDER — LIDOCAINE HCL (PF) 1 % IJ SOLN: 5.0000 mL | INTRAMUSCULAR | Status: DC | PRN

## 2020-04-22 MED ORDER — DEXTROSE 50 % IV SOLN
1.0000 | Freq: Once | INTRAVENOUS | Status: AC
  Administered 2020-04-22: 50 mL via INTRAVENOUS
  Filled 2020-04-22: qty 50

## 2020-04-22 NOTE — Discharge Instructions (Addendum)
Be sure to follow-up with your physician.

## 2020-04-22 NOTE — ED Provider Notes (Signed)
[facility] EMERGENCY DEPARTMENT Provider Note   CSN: [id number] Arrival date & time: 04/22/20  [id number]     History Chief Complaint  Patient presents with  . Fall    Daniel Whitney is a 65 y.o. male.  The history is provided by the patient and medical records. No language interpreter was used.  Near Syncope This is a new problem. The current episode started 12 to 24 hours ago. The problem occurs rarely. The problem has not changed since onset.Pertinent negatives include no chest pain, no abdominal pain, no headaches and no shortness of breath. Nothing aggravates the symptoms. Nothing relieves the symptoms. He has tried nothing for the symptoms. The treatment provided no relief.       Past Medical History:  Diagnosis Date  . Allergic rhinitis 07/07/2014  . Anemia due to other cause 07/07/2014  . Arthritis    "all over"  . Atrial fibrillation ([place]) 12/30/2012  . CHF (congestive heart failure) ([place]) 12/30/2012  . Chronic lower back pain   . Diabetes mellitus with neuropathy ([place])   . Diabetic retinopathy ([place]) 12/30/2012   no medications  . Dysrhythmia    Afib  . ESRD (end stage renal disease) on dialysis ([place]) started in 2013   MWF; [facility]; [employer]  . GERD (gastroesophageal reflux disease)   . Glaucoma   . History of gout    "right big toe"  . Hyperlipidemia   . Hypertension   . Hypothyroid    01/17/16- no longer on meduication  . Morbid obesity ([place]) 12/30/2012  . Myocardial infarction ([place])    "I've had ~ 3; last one was in ~ 10/2013" (01/30/2014)  . OSA (obstructive sleep apnea) 12/30/2012   "lost weight; no longer needed CPAP; retested said I needed it; didn't followup cause I was feeling fine" (01/30/2014)  . Pulmonary hypertension ([place]) 12/30/2012  . Refusal of blood transfusions as patient is Jehovah's Witness   . Stroke [facility]) ~ 2005; ~ 2005   "they were mild; I didn't even notice I'd had them"; denies residual on 01/30/2014  . Subdural  hematoma (Oct 2018)    Coumadin and heparin placed on hold at dc- pt did not FU w/ NS as recommended after dc so not on warfarin when admitted 04/03/17    Patient Active Problem List   Diagnosis Date Noted  . Noncompliance of patient with renal dialysis ([place]) 01/11/2020  . Prolonged QT interval 02/15/2019  . Shock ([place]) 02/24/2018  . Hyperkalemia, diminished renal excretion 01/01/2018  . Syncope and collapse 04/03/2017  . History of CVA (cerebrovascular accident) 04/03/2017  . CKD (chronic kidney disease) stage V requiring chronic dialysis ([place]) 04/03/2017  . Hypothyroid 04/03/2017  . Refusal of blood transfusions as patient is Jehovah's Witness 04/03/2017  . Prediabetes-HgbA1c 6.2 04/03/2017  . History of subdural hematoma Oct 2018 04/03/2017  . 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 Chronic atrial fibrillation 04/03/2017  . Subdural hematoma ([place]) 11/29/2016  . Peripheral neuropathy 07/26/2016  . At risk for adverse drug reaction 07/03/2016  . Primary osteoarthritis of left knee 06/26/2016  . Postoperative anemia due to acute blood loss 01/27/2016  . S/P knee replacement 01/24/2016  . Low back pain 02/09/2015  . Bilateral knee pain 01/27/2015  . Atypical atrial flutter ([place]) 01/24/2015  . Acute on chronic combined systolic and diastolic heart failure ([place]) 01/05/2015  . Hyperkalemia 11/08/2014  . Hypocalcemia 11/08/2014  . Recurrent falls 10/13/2014  . Venous stasis ulcer (  [place]) 10/13/2014  . Hypoxia   . Leukocytosis   . Respiratory failure ([place])   . ARDS (adult respiratory distress syndrome) ([place])   . Encounter for central line placement   . Cardiopulmonary arrest ([place])   . Compression fracture of L3 lumbar vertebra 08/04/2014  . Anemia of chronic renal failure, unspecified stage 07/07/2014  . Allergic rhinitis 07/07/2014  . Anemia of chronic disease 07/07/2014  . Right rotator cuff tear 04/27/2014  . Acute gouty arthritis  11/11/2013  . Hearing loss in right ear 11/11/2013  . Chronic pain syndrome 11/11/2013  . Right shoulder pain 11/11/2013  . Facial rash 11/01/2013  . Right otitis externa 10/27/2013  . Pulmonary HTN- PA 58 mmHg 04/16/13 09/17/2013  . Right hip pain 08/04/2013  . DOE (dyspnea on exertion) 06/18/2013  . Allergic rhinitis, cause unspecified 06/18/2013  . Carpal tunnel syndrome 06/06/2013  . Primary localized osteoarthrosis, lower leg 06/04/2013  . Preoperative cardiovascular examination 03/10/2013  . Preventative health care 12/30/2012  . AF (paroxysmal atrial fibrillation) ([place]) 12/30/2012  . CHF (congestive heart failure) ([place]) 12/30/2012  . Diabetic retinopathy ([place]) 12/30/2012  . Morbid obesity ([place]) 12/30/2012  . OSA (obstructive sleep apnea)- on C-pap 12/30/2012  . Bilateral hearing loss 12/30/2012  . Arthritis   . Glaucoma   . Diabetes mellitus with neuropathy ([place])   . Hypertension   . ESRD (end stage renal disease) on dialysis ([place])   . Stroke ([place])   . Hyperlipidemia     Past Surgical History:  Procedure Laterality Date  . AV FISTULA PLACEMENT Left ~ 10/2013   "forearm"  . CARDIAC CATHETERIZATION N/A 01/07/2015   Procedure: Left Heart Cath and Coronary Angiography;  Surgeon: Troy Sine, MD;  Location: [facility];  Service: Cardiovascular;  Laterality: N/A;  . CLOSED REDUCTION HIP DISLOCATION Right 1970's  . JOINT REPLACEMENT    . TOTAL HIP ARTHROPLASTY Right 1980  . TOTAL KNEE ARTHROPLASTY Right 01/24/2016   Procedure: TOTAL KNEE ARTHROPLASTY;  Surgeon: Dorna Leitz, MD;  [facility];  Service: Orthopedics;  Laterality: Right;  . TOTAL KNEE ARTHROPLASTY Left 06/26/2016   Procedure: TOTAL KNEE ARTHROPLASTY;  Surgeon: Dorna Leitz, MD;  Location: [facility];  Service: Orthopedics;  Laterality: Left;       Family History  Problem Relation Age of Onset  . Arthritis Other   . Heart disease Other   . Hyperlipidemia Other   . Hypertension Other   . Kidney  disease Other   . Diabetes Other   . Stroke Other   . Diabetes Mother   . Cancer Father        bone cancer  . Heart disease Father   . Diabetes Sister   . Diabetes Brother   . Stroke Brother     Social History   Tobacco Use  . Smoking status: Never Smoker  . Smokeless tobacco: Never Used  Vaping Use  . Vaping Use: Never used  Substance Use Topics  . Alcohol use: Yes    Alcohol/week: 7.0 - 8.0 standard drinks    Types: 1 Cans of beer, 3 Shots of liquor, 3 - 4 Standard drinks or equivalent per week    Comment: 07/03/16 1 pint 2-3X/week  . Drug use: No    Home Medications Prior to Admission medications   Medication Sig Start Date End Date Taking? Authorizing Provider  amitriptyline (ELAVIL) 50 MG tablet Take 1 tablet (50 mg total) by mouth at bedtime. Patient not taking: Reported on 01/12/2020 02/28/18  Rai, Ripudeep K, MD  AURYXIA 1 GM 210 MG(Fe) tablet Take 210 mg by mouth 3 (three) times daily with meals. 11/27/19   [provider]  b complex-vitamin c-folic acid (NEPHRO-VITE) 0.8 MG TABS tablet Take 1 tablet by mouth daily.    [provider]  cinacalcet (SENSIPAR) 30 MG tablet Take 30 mg by mouth daily with supper. 12/23/17   [provider]  gabapentin (NEURONTIN) 100 MG capsule Take 2 capsules (200 mg total) by mouth at bedtime. 200 mg qam and 300 mg upon return from dialysis on M-W-F for neuropathy Patient taking differently: Take 100 mg by mouth at bedtime.  07/31/16   Medina-Vargas, Monina C, NP  simvastatin (ZOCOR) 20 MG tablet Take 1 tablet (20 mg total) by mouth daily. 01/12/20   Kayleen Memos, DO    Allergies    Tobacco [tobacco] and Other  Review of Systems   Review of Systems  Constitutional: Positive for fatigue. Negative for chills, diaphoresis and fever.  HENT: Negative for congestion.   Respiratory: Negative for cough, chest tightness, shortness of breath and wheezing.   Cardiovascular: Positive for near-syncope. Negative for  chest pain, palpitations and leg swelling.  Gastrointestinal: Negative for abdominal pain, constipation, diarrhea, nausea and vomiting.  Genitourinary:       Does not make urine  Musculoskeletal: Negative for back pain, neck pain and neck stiffness.  Neurological: Positive for light-headedness. Negative for dizziness, syncope, weakness and headaches.  Psychiatric/Behavioral: Negative for agitation.  All other systems reviewed and are negative.   Physical Exam Updated Vital Signs BP (!) 157/93 (BP Location: Right Arm)   Pulse 81   Temp 97.9 F (36.6 C)   Resp 18   SpO2 90%   Physical Exam Vitals and nursing note reviewed.  Constitutional:      General: He is not in acute distress.    Appearance: He is well-developed. He is not ill-appearing, toxic-appearing or diaphoretic.  HENT:     Head: Normocephalic and atraumatic.     Nose: No congestion or rhinorrhea.     Mouth/Throat:     Pharynx: No oropharyngeal exudate or posterior oropharyngeal erythema.  Eyes:     Conjunctiva/sclera: Conjunctivae normal.  Cardiovascular:     Rate and Rhythm: Normal rate and regular rhythm.     Heart sounds: No murmur heard.   Pulmonary:     Effort: Pulmonary effort is normal. No respiratory distress.     Breath sounds: Rales (faint) present. No wheezing or rhonchi.  Chest:     Chest wall: No tenderness.  Abdominal:     General: Abdomen is flat.     Palpations: Abdomen is soft.     Tenderness: There is no abdominal tenderness. There is no right CVA tenderness, left CVA tenderness, guarding or rebound.  Musculoskeletal:        General: Swelling and tenderness present.     Cervical back: Neck supple. No tenderness.     Right knee: Decreased range of motion. Tenderness present.     Right lower leg: Edema present.     Left lower leg: Edema present.     Comments: Tenderness in the right knee with some decreased range of motion.  He reports this is how it is been for the last few weeks.  He has  some soreness in the rest of his leg as well.  Normal sensation and pulses.  Skin:    General: Skin is warm and dry.     Capillary Refill: Capillary  refill takes less than 2 seconds.     Findings: No erythema.  Neurological:     General: No focal deficit present.     Mental Status: He is alert.     Sensory: No sensory deficit.     Motor: No weakness.  Psychiatric:        Mood and Affect: Mood normal.     ED Results / Procedures / Treatments   Labs (all labs ordered are listed, but only abnormal results are displayed) Labs Reviewed  BASIC METABOLIC PANEL - Abnormal; Notable for the following components:      Result Value   Sodium 131 (*)    Potassium 6.5 (*)    Chloride 95 (*)    CO2 17 (*)    Glucose, Bld 107 (*)    BUN 105 (*)    Creatinine, Ser 17.29 (*)    GFR, Estimated 3 (*)    Anion gap 19 (*)    All other components within normal limits  CBC - Abnormal; Notable for the following components:   RBC 3.62 (*)    Hemoglobin 10.8 (*)    HCT 33.8 (*)    RDW 17.9 (*)    All other components within normal limits  HEPATIC FUNCTION PANEL - Abnormal; Notable for the following components:   Albumin 3.3 (*)    AST 11 (*)    Alkaline Phosphatase 187 (*)    All other components within normal limits  I-STAT CHEM 8, ED - Abnormal; Notable for the following components:   Sodium 129 (*)    Potassium 6.5 (*)    BUN 103 (*)    Creatinine, Ser >18.00 (*)    Glucose, Bld 101 (*)    Calcium, Ion 0.86 (*)    TCO2 18 (*)    Hemoglobin 12.6 (*)    HCT 37.0 (*)    All other components within normal limits  RESP PANEL BY RT-PCR (FLU A&B, COVID) ARPGX2  MAGNESIUM    EKG EKG Interpretation  Date/Time:  Friday April 22 2020 09:39:50 EST Ventricular Rate:  105 PR Interval:    QRS Duration: 152 QT Interval:  390 QTC Calculation: 515 R Axis:   -137 Text Interpretation: Atrial fibrillation with rapid ventricular response with premature ventricular or aberrantly conducted  complexes Right bundle branch block Possible Lateral infarct , age undetermined Inferior infarct , age undetermined Abnormal ECG when compared to prior, simlar  afib but now appears to show more of a RBBB like past ECG. Also consider hyperkalemia. No STEMI Confirmed by Antony Blackbird [id number]) on 04/22/2020 10:38:35 AM   Radiology DG Knee Complete 4 Views Right  Result Date: 04/22/2020 CLINICAL DATA:  65 year old wheelchair-bound patient who fell last night onto his coffee table, injuring the RIGHT knee. Initial encounter. Personal history of RIGHT knee arthroplasty. EXAM: RIGHT KNEE - COMPLETE 4+ VIEW COMPARISON:  02/10/2019 and earlier. FINDINGS: The patient was unable to fully extend the knee, therefore all of the images were obtained in flexion. No evidence of acute fracture. The prosthesis appears anatomically aligned while flexed. 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 Chronic dystrophic calcifications/ossification involving the patellar tendon. Possible small joint effusion. Suprapatellar soft tissue swelling. Extensive arterial calcifications. IMPRESSION: 1. No acute osseous abnormality. 2. Possible small joint effusion. 3. Suprapatellar soft tissue swelling. Electronically Signed   By: Evangeline Dakin M.D.   On: 04/22/2020 10:38   VAS [place] LOWER EXTREMITY VENOUS (DVT) (ONLY [facility] & [facility])  Result Date: 04/22/2020  Lower Venous DVT Study Indications: Pain.  Comparison Study: no prior Performing Technologist: Abram Sander RVS  Examination Guidelines: A complete evaluation includes B-mode imaging, spectral Doppler, color Doppler, and power Doppler as needed of all accessible portions of each vessel. Bilateral testing is considered an integral part of a complete examination. Limited examinations for reoccurring indications may be performed as noted. The reflux portion of the exam is performed with the patient in reverse Trendelenburg.   +---------+---------------+---------+-----------+----------+--------------+ RIGHT    CompressibilityPhasicitySpontaneityPropertiesThrombus Aging +---------+---------------+---------+-----------+----------+--------------+ CFV      Full           Yes      Yes                                 +---------+---------------+---------+-----------+----------+--------------+ SFJ      Full                                                        +---------+---------------+---------+-----------+----------+--------------+ FV Prox  Full                                                        +---------+---------------+---------+-----------+----------+--------------+ FV Mid   Full                                                        +---------+---------------+---------+-----------+----------+--------------+ FV DistalFull                                                        +---------+---------------+---------+-----------+----------+--------------+ PFV      Full                                                        +---------+---------------+---------+-----------+----------+--------------+ POP      Full           Yes      Yes                                 +---------+---------------+---------+-----------+----------+--------------+ PTV      Full                                                        +---------+---------------+---------+-----------+----------+--------------+ PERO     Full                                                        +---------+---------------+---------+-----------+----------+--------------+   +----+---------------+---------+-----------+----------+--------------+  LEFTCompressibilityPhasicitySpontaneityPropertiesThrombus Aging +----+---------------+---------+-----------+----------+--------------+ CFV                Yes      Yes                                  +----+---------------+---------+-----------+----------+--------------+     Summary: RIGHT: - There is no evidence of deep vein thrombosis in the lower extremity.  - No cystic structure found in the popliteal fossa.  LEFT: - No evidence of common femoral vein obstruction.  *See table(s) above for measurements and observations.    Preliminary     Procedures Procedures   CRITICAL CARE Performed by: Gwenyth Allegra Brien Lowe Total critical care time: 35 minutes Critical care time was exclusive of separately billable procedures and treating other patients. Critical care was necessary to treat or prevent imminent or life-threatening deterioration. Critical care was time spent personally by me on the following activities: development of treatment plan with patient and/or surrogate as well as nursing, discussions with consultants, evaluation of patient's response to treatment, examination of patient, obtaining history from patient or surrogate, ordering and performing treatments and interventions, ordering and review of laboratory studies, ordering and review of radiographic studies, pulse oximetry and re-evaluation of patient's condition.    Medications Ordered in ED Medications  Chlorhexidine Gluconate Cloth 2 % PADS 6 each (0 each Topical Hold 04/22/20 1230)  pentafluoroprop-tetrafluoroeth (GEBAUERS) aerosol 1 application (has no administration in time range)  lidocaine (PF) (XYLOCAINE) 1 % injection 5 mL (has no administration in time range)  lidocaine-prilocaine (EMLA) cream 1 application (has no administration in time range)  0.9 %  sodium chloride infusion (has no administration in time range)  0.9 %  sodium chloride infusion (has no administration in time range)  heparin injection 1,000 Units (has no administration in time range)  alteplase (CATHFLO ACTIVASE) injection 2 mg (has no administration in time range)  heparin injection 5,000 Units (has no administration in time range)  sodium  zirconium cyclosilicate (LOKELMA) packet 10 g (10 g Oral Given 04/22/20 1212)  insulin aspart (novoLOG) injection 5 Units (5 Units Intravenous Given 04/22/20 1228)    And  dextrose 50 % solution 50 mL (50 mLs Intravenous Given 04/22/20 1227)  calcium gluconate 1 g/ 50 mL sodium chloride IVPB (0 g Intravenous Stopped 04/22/20 1230)    ED Course  I have reviewed the triage vital signs and the nursing notes.  Pertinent labs & imaging results that were available during my care of the patient were reviewed by me and considered in my medical decision making (see chart for details).    MDM Rules/Calculators/A&P                          Daniel Whitney is a 65 y.o. male with a past medical history significant for ESRD on dialysis MWF, diabetes, hypertension, hyperlipidemia, atrial fibrillation not on anticoagulation, prior subdural hematoma, obesity, venous stasis disease in his legs, prior stroke, and CHF who presents with right knee pain after a fall last night due to lightheadedness.  Patient reports that he has had right leg soreness for the last 2 weeks and has had some difficulty fully extending his right knee.  He reports that he was unable to go to dialysis on Wednesday because his transportation did not arrive.  He is unsure what happened.  He says that he was feeling lightheaded and fatigued today  leading to this near syncopal episode and hitting his knee on the table.  He reports some pain and feel at this slightly more swollen than baseline.  He denies any history of DVT or PE but does report his right leg has been sore and not wanting to extend for several weeks.  He denies chest pain, palpitations, shortness of breath.  He reports he does feel fatigued like he did when his potassium was very elevated last year.  On arrival, EKG was shot and appears to show a widened complex concerning for right bundle branch versus electrolyte abnormality.  Review of prior show that this is how it appeared when he  had hyperkalemia before.  Screening labs were added and the knee x-ray was obtained in triage.  On exam, lungs have some faint crackles otherwise are clear.  Chest and abdomen are nontender.  Patient has some edema in legs which he reports is at baseline and he has some tenderness in his right knee.  He did have intact pulses on my exam in the legs.  He reports he had some difficulty extending his knee but otherwise exam unremarkable.  Patient had an i-STAT Chem-8 as well as labs collected.  Potassium was found to elevated at 6.5.  I called nephrology who will come see the patient and taken for dialysis.  They initially felt he might be appropriate for discharge if dialysis went well and he was feeling better.  At one point, patient did go into a junctional wide rhythm but then it resolved after the medications were started.  We will discuss with them again if they want him to be admitted versus reassessed in the emergency department given these EKG changes and rhythm changes.  X-ray shows some mild effusion and the superficial swelling but otherwise no acute bony abnormality.  Suspect soft tissue injury.  Patient can follow-up with PCP for the leg injury but I more concerned about the potassium abnormality and missing dialysis.  Anticipate reassessment after dialysis   Final Clinical Impression(s) / ED Diagnoses Final diagnoses:  Fall, initial encounter  Acute pain of right knee  Hyperkalemia  Near syncope     Clinical Impression: 1. Fall, initial encounter   2. Acute pain of right knee   3. Hyperkalemia   4. Near syncope     Disposition: Anticipate reassessment after dialysis to determine disposition.  This note was prepared with assistance of Systems analyst. Occasional wrong-word or sound-a-like substitutions may have occurred due to the inherent limitations of voice recognition software.      Brice Kossman, Gwenyth Allegra, MD 04/22/20 [phone]

## 2020-04-22 NOTE — Progress Notes (Signed)
Lower extremity venous has been completed.   Preliminary results in CV Proc.   Abram Sander 04/22/2020 12:51 PM

## 2020-04-22 NOTE — ED Notes (Signed)
[facility] called, 16 ahead

## 2020-04-22 NOTE — ED Triage Notes (Signed)
Pt from home with ems for fall last night onto his coffee table. Pt wheelchair bound, pt c.o right knee pain. Denies hitting his head or LOC. Pt receives dialysis MWF, last dialysis was Monday, reports transportation issues on Wednesday, pt due for today. Pt also c.o headache that started this morning. Pt a.o

## 2020-04-22 NOTE — Consult Note (Signed)
[facility] Renal Consultation Note    Indication for Consultation:  Management of ESRD/hemodialysis; anemia, hypertension/volume and secondary hyperparathyroidism  HPI: Daniel Whitney is a 65 y.o. male with ESRD on HD MWF, AFib, HTN, CAD, CHF, prior CVA. He presented to the ED this am after a fall at home with a knee injury. Found to be hyperkalemic with EKG changes on arrival. Nephrology consulted for urgent dialysis. Labs: Na 131, K 6.5 CO2 17, BUN 105 Cr 17.29. Hgb 10.8. He received calcium gluconate, insulin, Lokelma in the ED.   Dialysis at [facility] MWF. He does have a history of nonadherence to his dialysis treatments. His last dialysis was 04/13/20. He has missed his last 3 treatments.  Seen and examined in dialysis unit. He disputes missing this many treatments. He thinks he only missed one but admits "my memory is not as good as it used to be". He is oriented to self and place but can't tell me the year. Drowsy during questions. He lives alone but he thinks he might need some help taking care of himself.   Past Medical History:  Diagnosis Date  . Allergic rhinitis 07/07/2014  . Anemia due to other cause 07/07/2014  . Arthritis    "all over"  . Atrial fibrillation ([place]) 12/30/2012  . CHF (congestive heart failure) ([place]) 12/30/2012  . Chronic lower back pain   . Diabetes mellitus with neuropathy ([place])   . Diabetic retinopathy ([place]) 12/30/2012   no medications  . Dysrhythmia    Afib  . ESRD (end stage renal disease) on dialysis ([place]) started in 2013   MWF; [facility]; [employer]  . GERD (gastroesophageal reflux disease)   . Glaucoma   . History of gout    "right big toe"  . Hyperlipidemia   . Hypertension   . Hypothyroid    01/17/16- no longer on meduication  . Morbid obesity ([place]) 12/30/2012  . Myocardial infarction ([place])    "I've had ~ 3; last one was in ~ 10/2013" (01/30/2014)  . OSA (obstructive sleep apnea) 12/30/2012   "lost weight;  no longer needed CPAP; retested said I needed it; didn't followup cause I was feeling fine" (01/30/2014)  . Pulmonary hypertension ([place]) 12/30/2012  . Refusal of blood transfusions as patient is Jehovah's Witness   . Stroke [facility]) ~ 2005; ~ 2005   "they were mild; I didn't even notice I'd had them"; denies residual on 01/30/2014  . Subdural hematoma (Oct 2018)    Coumadin and heparin placed on hold at dc- pt did not FU w/ NS as recommended after dc so not on warfarin when admitted 04/03/17   Past Surgical History:  Procedure Laterality Date  . AV FISTULA PLACEMENT Left ~ 10/2013   "forearm"  . CARDIAC CATHETERIZATION N/A 01/07/2015   Procedure: Left Heart Cath and Coronary Angiography;  Surgeon: Troy Sine, MD;  Location: [place] [facility];  Service: Cardiovascular;  Laterality: N/A;  . CLOSED REDUCTION HIP DISLOCATION Right 1970's  . JOINT REPLACEMENT    . TOTAL HIP ARTHROPLASTY Right 1980  . TOTAL KNEE ARTHROPLASTY Right 01/24/2016   Procedure: TOTAL KNEE ARTHROPLASTY;  Surgeon: Dorna Leitz, MD;  Location: [place];  Service: Orthopedics;  Laterality: Right;  . TOTAL KNEE ARTHROPLASTY Left 06/26/2016   Procedure: TOTAL KNEE ARTHROPLASTY;  Surgeon: Dorna Leitz, MD;  Location: [place];  Service: Orthopedics;  Laterality: Left;   Family History  Problem Relation Age of Onset  . Arthritis Other   .  Heart disease Other   . Hyperlipidemia Other   . Hypertension Other   . Kidney disease Other   . Diabetes Other   . Stroke Other   . Diabetes Mother   . Cancer Father        bone cancer  . Heart disease Father   . Diabetes Sister   . Diabetes Brother   . Stroke Brother    Social History:  reports that he has never smoked. He has never used smokeless tobacco. He reports current alcohol use of about 7.0 - 8.0 standard drinks of alcohol per week. He reports that he does not use drugs. Allergies  Allergen Reactions  . Tobacco [Tobacco] Shortness Of Breath  . Other     Perfume &  cologne-eyes itchy   Prior to Admission medications   Medication Sig Start Date End Date Taking? Authorizing Provider  amitriptyline (ELAVIL) 50 MG tablet Take 1 tablet (50 mg total) by mouth at bedtime. Patient not taking: Reported on 01/12/2020 02/28/18   Rai, Vernelle Emerald, MD  AURYXIA 1 GM 210 MG(Fe) tablet Take 210 mg by mouth 3 (three) times daily with meals. 11/27/19   [provider]  b complex-vitamin c-folic acid (NEPHRO-VITE) 0.8 MG TABS tablet Take 1 tablet by mouth daily.    [provider]  cinacalcet (SENSIPAR) 30 MG tablet Take 30 mg by mouth daily with supper. 12/23/17   [provider]  gabapentin (NEURONTIN) 100 MG capsule Take 2 capsules (200 mg total) by mouth at bedtime. 200 mg qam and 300 mg upon return from dialysis on M-W-F for neuropathy Patient taking differently: Take 100 mg by mouth at bedtime.  07/31/16   Medina-Vargas, Monina C, NP  simvastatin (ZOCOR) 20 MG tablet Take 1 tablet (20 mg total) by mouth daily. 01/12/20   Kayleen Memos, DO   Current Facility-Administered Medications  Medication Dose Route Frequency Provider Last Rate Last Admin  . 0.9 %  sodium chloride infusion  100 mL Intravenous PRN Zandyr Barnhill, Thomos Lemons, PA-C      . 0.9 %  sodium chloride infusion  100 mL Intravenous PRN Ezekeil Bethel, Thomos Lemons, PA-C      . alteplase (CATHFLO ACTIVASE) injection 2 mg  2 mg Intracatheter Once PRN Lynnda Child, PA-C      . Chlorhexidine Gluconate Cloth 2 % PADS 6 each  6 each Topical Q0600 Lynnda Child, PA-C      . heparin injection 1,000 Units  1,000 Units Dialysis PRN Lynnda Child, PA-C      . heparin injection 5,000 Units  5,000 Units Dialysis Once in dialysis Haru Anspaugh, Thomos Lemons, PA-C      . lidocaine (PF) (XYLOCAINE) 1 % injection 5 mL  5 mL Intradermal PRN Lynnda Child, PA-C      . lidocaine-prilocaine (EMLA) cream 1 application  1 application Topical PRN Lusia Greis, Thomos Lemons, PA-C      .  pentafluoroprop-tetrafluoroeth (GEBAUERS) aerosol 1 application  1 application Topical PRN Lynnda Child, PA-C       Current Outpatient Medications  Medication Sig Dispense Refill  . amitriptyline (ELAVIL) 50 MG tablet Take 1 tablet (50 mg total) by mouth at bedtime. (Patient not taking: Reported on 01/12/2020) 30 tablet 0  . AURYXIA 1 GM 210 MG(Fe) tablet Take 210 mg by mouth 3 (three) times daily with meals.    Marland Kitchen b complex-vitamin c-folic acid (NEPHRO-VITE) 0.8 MG TABS tablet Take 1 tablet by mouth daily.    . cinacalcet (  SENSIPAR) 30 MG tablet Take 30 mg by mouth daily with supper.  6  . gabapentin (NEURONTIN) 100 MG capsule Take 2 capsules (200 mg total) by mouth at bedtime. 200 mg qam and 300 mg upon return from dialysis on M-W-F for neuropathy (Patient taking differently: Take 100 mg by mouth at bedtime. ) 60 capsule 0  . simvastatin (ZOCOR) 20 MG tablet Take 1 tablet (20 mg total) by mouth daily. 30 tablet 0     ROS: As per HPI otherwise negative.  Physical Exam: Vitals:   04/22/20 1315 04/22/20 1330 04/22/20 1445 04/22/20 1452  BP: 131/70 128/84 134/72 91/60  Pulse:  74 79 74  Resp: '17 18 18   '$ Temp:   97.8 F (36.6 C)   TempSrc:   Oral   SpO2:  93% 95%   Weight:   111.5 kg   Height:         General: Chronically ill appearing, nad  Head: NCAT sclera not icteric MMM Neck: Supple. No JVD appreciated  Lungs: CTA bilaterally without wheezes, rales, or rhonchi.  Heart: RRR with S1 S2 Abdomen: soft non-tender  Lower Trace LE edema bilaterally, chronic skin changes  Neuro: A & O  X 3. Moves all extremities spontaneously. Psych:  Responds to questions appropriately with a normal affect. Dialysis Access: L AVF +bruit   Labs: Basic Metabolic Panel: Recent Labs  Lab 04/22/20 0938 04/22/20 1108  NA 131* 129*  K 6.5* 6.5*  CL 95* 103  CO2 17*  --   GLUCOSE 107* 101*  BUN 105* 103*  CREATININE 17.29* >18.00*  CALCIUM 9.2  --    Liver Function Tests: Recent Labs   Lab 04/22/20 0938  AST 11*  ALT 15  ALKPHOS 187*  BILITOT 1.0  PROT 8.0  ALBUMIN 3.3*   No results for input(s): LIPASE, AMYLASE in the last 168 hours. No results for input(s): AMMONIA in the last 168 hours. CBC: Recent Labs  Lab 04/22/20 0938 04/22/20 1108  WBC 8.4  --   HGB 10.8* 12.6*  HCT 33.8* 37.0*  MCV 93.4  --   PLT 172  --    Cardiac Enzymes: No results for input(s): CKTOTAL, CKMB, CKMBINDEX, TROPONINI in the last 168 hours. CBG: No results for input(s): GLUCAP in the last 168 hours. Iron Studies: No results for input(s): IRON, TIBC, TRANSFERRIN, FERRITIN in the last 72 hours. Studies/Results: DG Knee Complete 4 Views Right  Result Date: 04/22/2020 CLINICAL DATA:  65 year old wheelchair-bound patient who fell last night onto his coffee table, injuring the RIGHT knee. Initial encounter. Personal history of RIGHT knee arthroplasty. EXAM: RIGHT KNEE - COMPLETE 4+ VIEW COMPARISON:  02/10/2019 and earlier. FINDINGS: The patient was unable to fully extend the knee, therefore all of the images were obtained in flexion. No evidence of acute fracture. The prosthesis appears anatomically aligned while flexed. 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 Chronic dystrophic calcifications/ossification involving the patellar tendon. Possible small joint effusion. Suprapatellar soft tissue swelling. Extensive arterial calcifications. IMPRESSION: 1. No acute osseous abnormality. 2. Possible small joint effusion. 3. Suprapatellar soft tissue swelling. Electronically Signed   By: Evangeline Dakin M.D.   On: 04/22/2020 10:38   VAS [place] LOWER EXTREMITY VENOUS (DVT) (ONLY [facility] & [facility])  Result Date: 04/22/2020  Lower Venous DVT Study Indications: Pain.  Comparison Study: no prior Performing Technologist: Abram Sander RVS  Examination Guidelines: A complete evaluation includes B-mode imaging, spectral Doppler, color Doppler, and power  Doppler as needed of all  accessible portions of each vessel. Bilateral testing is considered an integral part of a complete examination. Limited examinations for reoccurring indications may be performed as noted. The reflux portion of the exam is performed with the patient in reverse Trendelenburg.  +---------+---------------+---------+-----------+----------+--------------+ RIGHT    CompressibilityPhasicitySpontaneityPropertiesThrombus Aging +---------+---------------+---------+-----------+----------+--------------+ CFV      Full           Yes      Yes                                 +---------+---------------+---------+-----------+----------+--------------+ SFJ      Full                                                        +---------+---------------+---------+-----------+----------+--------------+ FV Prox  Full                                                        +---------+---------------+---------+-----------+----------+--------------+ FV Mid   Full                                                        +---------+---------------+---------+-----------+----------+--------------+ FV DistalFull                                                        +---------+---------------+---------+-----------+----------+--------------+ PFV      Full                                                        +---------+---------------+---------+-----------+----------+--------------+ POP      Full           Yes      Yes                                 +---------+---------------+---------+-----------+----------+--------------+ PTV      Full                                                        +---------+---------------+---------+-----------+----------+--------------+ PERO     Full                                                        +---------+---------------+---------+-----------+----------+--------------+   +----+---------------+---------+-----------+----------+--------------+  LEFTCompressibilityPhasicitySpontaneityPropertiesThrombus Aging +----+---------------+---------+-----------+----------+--------------+ CFV                Yes      Yes                                 +----+---------------+---------+-----------+----------+--------------+     Summary: RIGHT: - There is no evidence of deep vein thrombosis in the lower extremity.  - No cystic structure found in the popliteal fossa.  LEFT: - No evidence of common femoral vein obstruction.  *See table(s) above for measurements and observations.    Preliminary     Dialysis Orders:  [facility] MWF  4h 15 min 450/A1.5x EDW 104.5kg 2K/3.5Ca  AVF Heparin 5000 Venofer 50 q wk  Mircera 200 q 2 wk (last 2/21)    Assessment/Plan: 1. Hyperkalemia -- 2/2 to missed dialysis. Missed about a week of treatments. Will correct with dialysis today  2. ESRD --- HD MWF. History of noncompliance with outpatient dialysis. HD today as above. Doesn't really have a good reason for missing. Does appear lethargic/sluggish today. See if improved post HD.  3. Hypertension/volume  --- BP acceptable. Weights up 7kg over dry weight. Attempt 4L UF today .  4. Anemia  - No ESA needs currently  5. Metabolic bone disease ---  Ca ok. Continue Sensipar, Auryixa if stays in hospital  6. Fall at home/knee injury   Lynnda Child PA-C [facility] 04/22/2020, 3:02 PM

## 2020-04-22 NOTE — ED Provider Notes (Signed)
8:25 PM Patient back from dialysis, states that he feels good.  No complaints, patient is appropriate for discharge.   Carmin Muskrat, MD 04/22/20 2025

## 2020-04-23 DIAGNOSIS — R279 Unspecified lack of coordination: Secondary | ICD-10-CM | POA: Diagnosis not present

## 2020-04-23 DIAGNOSIS — W19XXXA Unspecified fall, initial encounter: Secondary | ICD-10-CM | POA: Diagnosis not present

## 2020-04-23 DIAGNOSIS — R0902 Hypoxemia: Secondary | ICD-10-CM | POA: Diagnosis not present

## 2020-04-23 DIAGNOSIS — Z743 Need for continuous supervision: Secondary | ICD-10-CM | POA: Diagnosis not present

## 2020-04-29 ENCOUNTER — Ambulatory Visit (HOSPITAL_COMMUNITY): Payer: Medicare HMO

## 2020-04-29 ENCOUNTER — Other Ambulatory Visit: Payer: Self-pay

## 2020-04-29 ENCOUNTER — Emergency Department (HOSPITAL_COMMUNITY): Payer: Medicare HMO

## 2020-04-29 ENCOUNTER — Encounter (HOSPITAL_COMMUNITY): Payer: Self-pay

## 2020-04-29 ENCOUNTER — Inpatient Hospital Stay (HOSPITAL_COMMUNITY)
Admission: EM | Admit: 2020-04-29 | Discharge: 2020-05-10 | DRG: 640 | Disposition: A | Discharge status: Skilled Nursing Facility | Payer: Medicare HMO | Attending: Internal Medicine | Admitting: Internal Medicine

## 2020-04-29 ENCOUNTER — Telehealth: Payer: Self-pay | Admitting: Family Medicine

## 2020-04-29 DIAGNOSIS — Z96653 Presence of artificial knee joint, bilateral: Secondary | ICD-10-CM | POA: Diagnosis present

## 2020-04-29 DIAGNOSIS — N189 Chronic kidney disease, unspecified: Secondary | ICD-10-CM | POA: Diagnosis present

## 2020-04-29 DIAGNOSIS — N186 End stage renal disease: Secondary | ICD-10-CM | POA: Diagnosis present

## 2020-04-29 DIAGNOSIS — M1990 Unspecified osteoarthritis, unspecified site: Secondary | ICD-10-CM | POA: Diagnosis present

## 2020-04-29 DIAGNOSIS — M7989 Other specified soft tissue disorders: Secondary | ICD-10-CM | POA: Diagnosis not present

## 2020-04-29 DIAGNOSIS — Z8249 Family history of ischemic heart disease and other diseases of the circulatory system: Secondary | ICD-10-CM

## 2020-04-29 DIAGNOSIS — Z20822 Contact with and (suspected) exposure to covid-19: Secondary | ICD-10-CM | POA: Diagnosis present

## 2020-04-29 DIAGNOSIS — F1020 Alcohol dependence, uncomplicated: Secondary | ICD-10-CM | POA: Diagnosis present

## 2020-04-29 DIAGNOSIS — J811 Chronic pulmonary edema: Secondary | ICD-10-CM | POA: Diagnosis present

## 2020-04-29 DIAGNOSIS — I83028 Varicose veins of left lower extremity with ulcer other part of lower leg: Secondary | ICD-10-CM | POA: Diagnosis present

## 2020-04-29 DIAGNOSIS — R296 Repeated falls: Secondary | ICD-10-CM | POA: Diagnosis present

## 2020-04-29 DIAGNOSIS — I509 Heart failure, unspecified: Secondary | ICD-10-CM | POA: Diagnosis not present

## 2020-04-29 DIAGNOSIS — E08610 Diabetes mellitus due to underlying condition with diabetic neuropathic arthropathy: Secondary | ICD-10-CM | POA: Diagnosis not present

## 2020-04-29 DIAGNOSIS — Z9119 Patient's noncompliance with other medical treatment and regimen: Secondary | ICD-10-CM | POA: Diagnosis not present

## 2020-04-29 DIAGNOSIS — G4733 Obstructive sleep apnea (adult) (pediatric): Secondary | ICD-10-CM | POA: Diagnosis present

## 2020-04-29 DIAGNOSIS — I83018 Varicose veins of right lower extremity with ulcer other part of lower leg: Secondary | ICD-10-CM | POA: Diagnosis present

## 2020-04-29 DIAGNOSIS — G4489 Other headache syndrome: Secondary | ICD-10-CM | POA: Diagnosis not present

## 2020-04-29 DIAGNOSIS — E1122 Type 2 diabetes mellitus with diabetic chronic kidney disease: Secondary | ICD-10-CM | POA: Diagnosis present

## 2020-04-29 DIAGNOSIS — Z823 Family history of stroke: Secondary | ICD-10-CM

## 2020-04-29 DIAGNOSIS — D631 Anemia in chronic kidney disease: Secondary | ICD-10-CM | POA: Diagnosis present

## 2020-04-29 DIAGNOSIS — R278 Other lack of coordination: Secondary | ICD-10-CM | POA: Diagnosis not present

## 2020-04-29 DIAGNOSIS — M2550 Pain in unspecified joint: Secondary | ICD-10-CM | POA: Diagnosis not present

## 2020-04-29 DIAGNOSIS — I132 Hypertensive heart and chronic kidney disease with heart failure and with stage 5 chronic kidney disease, or end stage renal disease: Secondary | ICD-10-CM | POA: Diagnosis present

## 2020-04-29 DIAGNOSIS — K219 Gastro-esophageal reflux disease without esophagitis: Secondary | ICD-10-CM | POA: Diagnosis present

## 2020-04-29 DIAGNOSIS — R609 Edema, unspecified: Secondary | ICD-10-CM

## 2020-04-29 DIAGNOSIS — E875 Hyperkalemia: Secondary | ICD-10-CM | POA: Diagnosis present

## 2020-04-29 DIAGNOSIS — Z9115 Patient's noncompliance with renal dialysis: Secondary | ICD-10-CM | POA: Diagnosis not present

## 2020-04-29 DIAGNOSIS — I480 Paroxysmal atrial fibrillation: Secondary | ICD-10-CM | POA: Diagnosis present

## 2020-04-29 DIAGNOSIS — I120 Hypertensive chronic kidney disease with stage 5 chronic kidney disease or end stage renal disease: Secondary | ICD-10-CM | POA: Diagnosis not present

## 2020-04-29 DIAGNOSIS — E039 Hypothyroidism, unspecified: Secondary | ICD-10-CM | POA: Diagnosis present

## 2020-04-29 DIAGNOSIS — Z6833 Body mass index (BMI) 33.0-33.9, adult: Secondary | ICD-10-CM | POA: Diagnosis not present

## 2020-04-29 DIAGNOSIS — E669 Obesity, unspecified: Secondary | ICD-10-CM | POA: Diagnosis present

## 2020-04-29 DIAGNOSIS — R918 Other nonspecific abnormal finding of lung field: Secondary | ICD-10-CM | POA: Diagnosis not present

## 2020-04-29 DIAGNOSIS — E876 Hypokalemia: Secondary | ICD-10-CM | POA: Diagnosis present

## 2020-04-29 DIAGNOSIS — M25561 Pain in right knee: Secondary | ICD-10-CM | POA: Diagnosis present

## 2020-04-29 DIAGNOSIS — Z833 Family history of diabetes mellitus: Secondary | ICD-10-CM

## 2020-04-29 DIAGNOSIS — I10 Essential (primary) hypertension: Secondary | ICD-10-CM | POA: Diagnosis not present

## 2020-04-29 DIAGNOSIS — M2548 Effusion, other site: Secondary | ICD-10-CM | POA: Diagnosis not present

## 2020-04-29 DIAGNOSIS — M109 Gout, unspecified: Secondary | ICD-10-CM | POA: Diagnosis present

## 2020-04-29 DIAGNOSIS — Z992 Dependence on renal dialysis: Secondary | ICD-10-CM | POA: Diagnosis not present

## 2020-04-29 DIAGNOSIS — G8929 Other chronic pain: Secondary | ICD-10-CM | POA: Diagnosis present

## 2020-04-29 DIAGNOSIS — E11319 Type 2 diabetes mellitus with unspecified diabetic retinopathy without macular edema: Secondary | ICD-10-CM | POA: Diagnosis present

## 2020-04-29 DIAGNOSIS — L03115 Cellulitis of right lower limb: Secondary | ICD-10-CM

## 2020-04-29 DIAGNOSIS — M25461 Effusion, right knee: Secondary | ICD-10-CM | POA: Diagnosis not present

## 2020-04-29 DIAGNOSIS — E8770 Fluid overload, unspecified: Principal | ICD-10-CM | POA: Diagnosis present

## 2020-04-29 DIAGNOSIS — Z7401 Bed confinement status: Secondary | ICD-10-CM | POA: Diagnosis not present

## 2020-04-29 DIAGNOSIS — Z9109 Other allergy status, other than to drugs and biological substances: Secondary | ICD-10-CM

## 2020-04-29 DIAGNOSIS — I4510 Unspecified right bundle-branch block: Secondary | ICD-10-CM | POA: Diagnosis not present

## 2020-04-29 DIAGNOSIS — I517 Cardiomegaly: Secondary | ICD-10-CM | POA: Diagnosis not present

## 2020-04-29 DIAGNOSIS — E785 Hyperlipidemia, unspecified: Secondary | ICD-10-CM | POA: Diagnosis present

## 2020-04-29 DIAGNOSIS — E1140 Type 2 diabetes mellitus with diabetic neuropathy, unspecified: Secondary | ICD-10-CM | POA: Diagnosis present

## 2020-04-29 DIAGNOSIS — I4891 Unspecified atrial fibrillation: Secondary | ICD-10-CM | POA: Diagnosis not present

## 2020-04-29 DIAGNOSIS — M79604 Pain in right leg: Secondary | ICD-10-CM | POA: Diagnosis not present

## 2020-04-29 DIAGNOSIS — Z531 Procedure and treatment not carried out because of patient's decision for reasons of belief and group pressure: Secondary | ICD-10-CM | POA: Diagnosis present

## 2020-04-29 DIAGNOSIS — I252 Old myocardial infarction: Secondary | ICD-10-CM

## 2020-04-29 DIAGNOSIS — Z96641 Presence of right artificial hip joint: Secondary | ICD-10-CM | POA: Diagnosis present

## 2020-04-29 DIAGNOSIS — I482 Chronic atrial fibrillation, unspecified: Secondary | ICD-10-CM | POA: Diagnosis not present

## 2020-04-29 DIAGNOSIS — R000 Tachycardia, unspecified: Secondary | ICD-10-CM | POA: Diagnosis not present

## 2020-04-29 DIAGNOSIS — Z9181 History of falling: Secondary | ICD-10-CM | POA: Diagnosis not present

## 2020-04-29 DIAGNOSIS — F32A Depression, unspecified: Secondary | ICD-10-CM | POA: Diagnosis present

## 2020-04-29 DIAGNOSIS — I878 Other specified disorders of veins: Secondary | ICD-10-CM | POA: Diagnosis present

## 2020-04-29 DIAGNOSIS — E1142 Type 2 diabetes mellitus with diabetic polyneuropathy: Secondary | ICD-10-CM | POA: Diagnosis present

## 2020-04-29 DIAGNOSIS — M79661 Pain in right lower leg: Secondary | ICD-10-CM | POA: Diagnosis not present

## 2020-04-29 DIAGNOSIS — Z808 Family history of malignant neoplasm of other organs or systems: Secondary | ICD-10-CM

## 2020-04-29 DIAGNOSIS — Z8673 Personal history of transient ischemic attack (TIA), and cerebral infarction without residual deficits: Secondary | ICD-10-CM

## 2020-04-29 DIAGNOSIS — R52 Pain, unspecified: Secondary | ICD-10-CM

## 2020-04-29 DIAGNOSIS — R0902 Hypoxemia: Secondary | ICD-10-CM | POA: Diagnosis not present

## 2020-04-29 DIAGNOSIS — Z4931 Encounter for adequacy testing for hemodialysis: Secondary | ICD-10-CM | POA: Diagnosis not present

## 2020-04-29 LAB — CBC WITH DIFFERENTIAL/PLATELET
Abs Immature Granulocytes: 0.05 10*3/uL (ref 0.00–0.07)
Basophils Absolute: 0.1 10*3/uL (ref 0.0–0.1)
Basophils Relative: 1 %
Eosinophils Absolute: 0.3 10*3/uL (ref 0.0–0.5)
Eosinophils Relative: 3 %
HCT: 28.7 % — ABNORMAL LOW (ref 39.0–52.0)
Hemoglobin: 9.5 g/dL — ABNORMAL LOW (ref 13.0–17.0)
Immature Granulocytes: 1 %
Lymphocytes Relative: 7 %
Lymphs Abs: 0.7 10*3/uL (ref 0.7–4.0)
MCH: 29.8 pg (ref 26.0–34.0)
MCHC: 33.1 g/dL (ref 30.0–36.0)
MCV: 90 fL (ref 80.0–100.0)
Monocytes Absolute: 1.1 10*3/uL — ABNORMAL HIGH (ref 0.1–1.0)
Monocytes Relative: 11 %
Neutro Abs: 7.6 10*3/uL (ref 1.7–7.7)
Neutrophils Relative %: 77 %
Platelets: 194 10*3/uL (ref 150–400)
RBC: 3.19 MIL/uL — ABNORMAL LOW (ref 4.22–5.81)
RDW: 17.7 % — ABNORMAL HIGH (ref 11.5–15.5)
WBC: 9.8 10*3/uL (ref 4.0–10.5)
nRBC: 0 % (ref 0.0–0.2)

## 2020-04-29 LAB — RESP PANEL BY RT-PCR (FLU A&B, COVID) ARPGX2
Influenza A by PCR: NEGATIVE
Influenza B by PCR: NEGATIVE
SARS Coronavirus 2 by RT PCR: NEGATIVE

## 2020-04-29 LAB — COMPREHENSIVE METABOLIC PANEL
ALT: 16 U/L (ref 0–44)
AST: 7 U/L — ABNORMAL LOW (ref 15–41)
Albumin: 3 g/dL — ABNORMAL LOW (ref 3.5–5.0)
Alkaline Phosphatase: 183 U/L — ABNORMAL HIGH (ref 38–126)
Anion gap: 18 — ABNORMAL HIGH (ref 5–15)
BUN: 98 mg/dL — ABNORMAL HIGH (ref 8–23)
CO2: 22 mmol/L (ref 22–32)
Calcium: 9.6 mg/dL (ref 8.9–10.3)
Chloride: 91 mmol/L — ABNORMAL LOW (ref 98–111)
Creatinine, Ser: 16.75 mg/dL — ABNORMAL HIGH (ref 0.61–1.24)
GFR, Estimated: 3 mL/min — ABNORMAL LOW (ref >60)
Glucose, Bld: 105 mg/dL — ABNORMAL HIGH (ref 70–99)
Potassium: 5.6 mmol/L — ABNORMAL HIGH (ref 3.5–5.1)
Sodium: 131 mmol/L — ABNORMAL LOW (ref 135–145)
Total Bilirubin: 1.3 mg/dL — ABNORMAL HIGH (ref 0.3–1.2)
Total Protein: 7.3 g/dL (ref 6.5–8.1)

## 2020-04-29 LAB — I-STAT VENOUS BLOOD GAS, ED
Acid-base deficit: 4 mmol/L — ABNORMAL HIGH (ref 0.0–2.0)
Bicarbonate: 23.8 mmol/L (ref 20.0–28.0)
Calcium, Ion: 1.17 mmol/L (ref 1.15–1.40)
HCT: 34 % — ABNORMAL LOW (ref 39.0–52.0)
Hemoglobin: 11.6 g/dL — ABNORMAL LOW (ref 13.0–17.0)
O2 Saturation: 75 %
Potassium: 5.6 mmol/L — ABNORMAL HIGH (ref 3.5–5.1)
Sodium: 130 mmol/L — ABNORMAL LOW (ref 135–145)
TCO2: 25 mmol/L (ref 22–32)
pCO2, Ven: 56.1 mmHg (ref 44.0–60.0)
pH, Ven: 7.236 — ABNORMAL LOW (ref 7.250–7.430)
pO2, Ven: 48 mmHg — ABNORMAL HIGH (ref 32.0–45.0)

## 2020-04-29 LAB — PROTIME-INR
INR: 1.3 — ABNORMAL HIGH (ref 0.8–1.2)
Prothrombin Time: 16.1 seconds — ABNORMAL HIGH (ref 11.4–15.2)

## 2020-04-29 LAB — LACTIC ACID, PLASMA: Lactic Acid, Venous: 1 mmol/L (ref 0.5–1.9)

## 2020-04-29 LAB — CK: Total CK: 34 U/L — ABNORMAL LOW (ref 49–397)

## 2020-04-29 LAB — BRAIN NATRIURETIC PEPTIDE: B Natriuretic Peptide: 1845.6 pg/mL — ABNORMAL HIGH (ref 0.0–100.0)

## 2020-04-29 LAB — LIPASE, BLOOD: Lipase: 41 U/L (ref 11–51)

## 2020-04-29 LAB — MAGNESIUM: Magnesium: 2.2 mg/dL (ref 1.7–2.4)

## 2020-04-29 LAB — TROPONIN I (HIGH SENSITIVITY): Troponin I (High Sensitivity): 63 ng/L — ABNORMAL HIGH (ref <18)

## 2020-04-29 LAB — PHOSPHORUS: Phosphorus: 10.8 mg/dL — ABNORMAL HIGH (ref 2.5–4.6)

## 2020-04-29 MED ORDER — HEPARIN SODIUM (PORCINE) 1000 UNIT/ML DIALYSIS
1000.0000 [IU] | INTRAMUSCULAR | Status: DC | PRN
  Filled 2020-04-29: qty 1

## 2020-04-29 MED ORDER — LIDOCAINE-PRILOCAINE 2.5-2.5 % EX CREA
1.0000 | TOPICAL_CREAM | CUTANEOUS | Status: DC | PRN
Start: 2020-04-29 — End: 2020-04-29
  Filled 2020-04-29: qty 5

## 2020-04-29 MED ORDER — CHLORHEXIDINE GLUCONATE CLOTH 2 % EX PADS
6.0000 | MEDICATED_PAD | Freq: Every day | CUTANEOUS | Status: DC
  Administered 2020-05-01: 6 via TOPICAL

## 2020-04-29 MED ORDER — VANCOMYCIN HCL 2000 MG/400ML IV SOLN
2000.0000 mg | Freq: Once | INTRAVENOUS | Status: AC
  Administered 2020-04-30: 2000 mg via INTRAVENOUS
  Filled 2020-04-29 (×2): qty 400

## 2020-04-29 MED ORDER — PENTAFLUOROPROP-TETRAFLUOROETH EX AERO
1.0000 "application " | INHALATION_SPRAY | CUTANEOUS | Status: DC | PRN
  Filled 2020-04-29: qty 116

## 2020-04-29 MED ORDER — ALTEPLASE 2 MG IJ SOLR: 2.0000 mg | Freq: Once | INTRAMUSCULAR | Status: DC | PRN

## 2020-04-29 MED ORDER — SODIUM CHLORIDE 0.9 % IV SOLN: 100.0000 mL | INTRAVENOUS | Status: DC | PRN

## 2020-04-29 MED ORDER — CLINDAMYCIN PHOSPHATE 600 MG/50ML IV SOLN
600.0000 mg | Freq: Once | INTRAVENOUS | Status: AC
  Administered 2020-04-29: 600 mg via INTRAVENOUS
  Filled 2020-04-29: qty 50

## 2020-04-29 MED ORDER — LIDOCAINE HCL (PF) 1 % IJ SOLN: 5.0000 mL | INTRAMUSCULAR | Status: DC | PRN

## 2020-04-29 MED ORDER — SODIUM CHLORIDE 0.9 % IV SOLN
2.0000 g | Freq: Once | INTRAVENOUS | Status: AC
  Administered 2020-04-29: 2 g via INTRAVENOUS
  Filled 2020-04-29: qty 20

## 2020-04-29 NOTE — ED Notes (Signed)
Report given to dialysis RN. Holding ABX per dialysis RN. Pt added to transport list to go to dialysis.

## 2020-04-29 NOTE — H&P (Signed)
History and Physical    Daniel Whitney [id number] DOB: [birth year] DOA: 04/29/2020  PCP: [name], MD  Patient coming from: Home via EMS  I have personally briefly reviewed patient's old medical records in [place]  Chief Complaint: Leg pain, missed dialysis  HPI: Daniel Whitney is a 65 y.o. male with medical history significant for ESRD on MWF HD, paroxysmal atrial fibrillation not on anticoagulation, history of SDH 2018, HTN, HLD, diet-controlled T2DM, anemia of chronic kidney disease, OSA not on CPAP who presents to the ED for evaluation of leg pain and missed dialysis.  Patient last underwent dialysis on 04/22/2020 in hospital when he was seen in the ED.  He says he has lost motivation to go to dialysis because he "got sick of it."  Currently lives alone but previously his brother was living with him and helping him out however has since moved to a nursing facility after having a stroke.  Patient states he has had a depressed mood over the last few months which he feels is contributing to his nonadherence to dialysis.  Over the last week he has noticed increased swelling in both of his lower extremities.  He has also had pain in both of his legs.  He has not had any significant shortness of breath.  He denies any chest pain.  He says he only makes a scant amount of urine.  ED Course:  Initial vitals showed BP 163/92, pulse 98, RR 14, temp 100.6 F, SPO2 95% on room air.  Labs showed WBC 9.8, hemoglobin 9.5, platelets 194,000, lactic acid 1.0, CK 34, BNP 1845.6, sodium 131, potassium 5.6, bicarb 22, BUN 98, creatinine 16.75, serum glucose 105, phosphorus 10.8, magnesium 2.2, lipase 41, high-sensitivity troponin I 63.  VBG showed pH 7.236, PCO2 56.1, PO2 48.0.  Blood culture was obtained and pending.  SARS-CoV-2 PCR is negative.  Influenza A/B PCR is negative.  Right tibia/fibula x-ray shows right total knee arthroplasty with no evidence of hardware complication.  No bone  fracture.  Right knee x-ray shows small suprapatellar right knee joint effusion without bone fracture or dislocation.  Portable chest x-ray shows enlarged cardiac silhouette with pulmonary vascular congestion and pulmonary edema.  RLE ultrasound was negative for evidence of DVT.  Exam somewhat limited.  Nephrology were consulted and patient was taken for dialysis.  Patient was felt to have cellulitis of his right lower extremity.  Patient was given IV vancomycin, ceftriaxone, and clindamycin.  The hospitalist service was consulted to admit for further evaluation and management.  Review of Systems: All systems reviewed and are negative except as documented in history of present illness above.   Past Medical History:  Diagnosis Date  . Allergic rhinitis 07/07/2014  . Anemia due to other cause 07/07/2014  . Arthritis    "all over"  . Atrial fibrillation ([place]) 12/30/2012  . CHF (congestive heart failure) ([place]) 12/30/2012  . Chronic lower back pain   . Diabetes mellitus with neuropathy ([place])   . Diabetic retinopathy ([place]) 12/30/2012   no medications  . Dysrhythmia    Afib  . ESRD (end stage renal disease) on dialysis ([place]) started in 2013   MWF; [facility]; [employer]  . GERD (gastroesophageal reflux disease)   . Glaucoma   . History of gout    "right big toe"  . Hyperlipidemia   . Hypertension   . Hypothyroid    01/17/16- no longer on meduication  . Morbid obesity ([place]) 12/30/2012  . Myocardial infarction ([place])    "  I've had ~ 3; last one was in ~ 10/2013" (01/30/2014)  . OSA (obstructive sleep apnea) 12/30/2012   "lost weight; no longer needed CPAP; retested said I needed it; didn't followup cause I was feeling fine" (01/30/2014)  . Pulmonary hypertension ([place]) 12/30/2012  . Refusal of blood transfusions as patient is Jehovah's Witness   . Stroke [facility]) ~ 2005; ~ 2005   "they were mild; I didn't even notice I'd had them"; denies residual on 01/30/2014  . Subdural hematoma (Oct  2018)    Coumadin and heparin placed on hold at dc- pt did not FU w/ NS as recommended after dc so not on warfarin when admitted 04/03/17    Past Surgical History:  Procedure Laterality Date  . AV FISTULA PLACEMENT Left ~ 10/2013   "forearm"  . CARDIAC CATHETERIZATION N/A 01/07/2015   Procedure: Left Heart Cath and Coronary Angiography;  Surgeon: Troy Sine, MD;  Location: [place] [facility];  Service: Cardiovascular;  Laterality: N/A;  . CLOSED REDUCTION HIP DISLOCATION Right 1970's  . JOINT REPLACEMENT    . TOTAL HIP ARTHROPLASTY Right 1980  . TOTAL KNEE ARTHROPLASTY Right 01/24/2016   Procedure: TOTAL KNEE ARTHROPLASTY;  Surgeon: Dorna Leitz, MD;  Location: [place];  Service: Orthopedics;  Laterality: Right;  . TOTAL KNEE ARTHROPLASTY Left 06/26/2016   Procedure: TOTAL KNEE ARTHROPLASTY;  Surgeon: Dorna Leitz, MD;  Location: [place];  Service: Orthopedics;  Laterality: Left;    Social History:  reports that he has never smoked. He has never used smokeless tobacco. He reports previous alcohol use of about 7.0 - 8.0 standard drinks of alcohol per week. He reports that he does not use drugs.  Allergies  Allergen Reactions  . Tobacco [Tobacco] Shortness Of Breath  . Other     Perfume & cologne-eyes itchy    Family History  Problem Relation Age of Onset  . Arthritis Other   . Heart disease Other   . Hyperlipidemia Other   . Hypertension Other   . Kidney disease Other   . Diabetes Other   . Stroke Other   . Diabetes Mother   . Cancer Father        bone cancer  . Heart disease Father   . Diabetes Sister   . Diabetes Brother   . Stroke Brother      Prior to Admission medications   Medication Sig Start Date End Date Taking? Authorizing Provider  amitriptyline (ELAVIL) 50 MG tablet Take 1 tablet (50 mg total) by mouth at bedtime. Patient not taking: Reported on 01/12/2020 02/28/18   Rai, Vernelle Emerald, MD  AURYXIA 1 GM 210 MG(Fe) tablet Take 210 mg by mouth 3 (three) times  daily with meals. 11/27/19   [provider]  b complex-vitamin c-folic acid (NEPHRO-VITE) 0.8 MG TABS tablet Take 1 tablet by mouth daily.    [provider]  cinacalcet (SENSIPAR) 30 MG tablet Take 30 mg by mouth daily with supper. 12/23/17   [provider]  gabapentin (NEURONTIN) 100 MG capsule Take 2 capsules (200 mg total) by mouth at bedtime. 200 mg qam and 300 mg upon return from dialysis on M-W-F for neuropathy Patient taking differently: Take 100 mg by mouth at bedtime.  07/31/16   Medina-Vargas, Monina C, NP  simvastatin (ZOCOR) 20 MG tablet Take 1 tablet (20 mg total) by mouth daily. 01/12/20   Kayleen Memos, DO    Physical Exam: Vitals:   04/29/20 2055 04/29/20 2110 04/29/20 2130 04/29/20 2223  BP: 138/75 129/74 (!) 154/87 (!) 156/84  Pulse: 80 74 78 71  Resp: 19 (!) 24 (!) 23 (!) 26  Temp:   98.2 F (36.8 C)   TempSrc:   Oral   SpO2: 99% 100% 99% 99%  Weight:      Height:       Constitutional: Obese man resting supine in bed, NAD, calm, appears somewhat uncomfortable intermittently due to leg pain Eyes: PERRL, lids and conjunctivae normal ENMT: Mucous membranes are moist. Posterior pharynx clear of any exudate or lesions.Normal dentition.  Neck: normal, supple, no masses. Respiratory: clear to auscultation anteriorly.  Normal respiratory effort. No accessory muscle use.  Cardiovascular: Regular rate and rhythm, no murmurs / rubs / gallops.  +2 tight pitting bilateral lower extremity edema.  LUE aVF with palpable thrill. Abdomen: no tenderness, no masses palpated. Bowel sounds positive.  Musculoskeletal: no clubbing / cyanosis. No joint deformity upper and lower extremities. Good ROM, no contractures. Normal muscle tone.  Skin: Venous stasis changes both lower extremities with tight edema, increased erythema and warmth to touch of right lower extremity when compared to left with superficial anterior ulceration without active drainage. Neurologic:  CN 2-12 grossly intact. Sensation intact. Strength 5/5 in all 4.  Psychiatric: Normal judgment and insight. Alert and oriented x 3. Normal mood.       Labs on Admission: I have personally reviewed following labs and imaging studies  CBC: Recent Labs  Lab 04/29/20 1612 04/29/20 1658  WBC 9.8  --   NEUTROABS 7.6  --   HGB 9.5* 11.6*  HCT 28.7* 34.0*  MCV 90.0  --   PLT 194  --    Basic Metabolic Panel: Recent Labs  Lab 04/29/20 1612 04/29/20 1658  NA 131* 130*  K 5.6* 5.6*  CL 91*  --   CO2 22  --   GLUCOSE 105*  --   BUN 98*  --   CREATININE 16.75*  --   CALCIUM 9.6  --   MG 2.2  --   PHOS 10.8*  --    GFR: Estimated Creatinine Clearance: 5.4 mL/min (A) (by C-G formula based on SCr of 16.75 mg/dL (H)). Liver Function Tests: Recent Labs  Lab 04/29/20 1612  AST 7*  ALT 16  ALKPHOS 183*  BILITOT 1.3*  PROT 7.3  ALBUMIN 3.0*   Recent Labs  Lab 04/29/20 1612  LIPASE 41   No results for input(s): AMMONIA in the last 168 hours. Coagulation Profile: Recent Labs  Lab 04/29/20 1612  INR 1.3*   Cardiac Enzymes: Recent Labs  Lab 04/29/20 1612  CKTOTAL 34*   BNP (last 3 results) No results for input(s): PROBNP in the last 8760 hours. HbA1C: No results for input(s): HGBA1C in the last 72 hours. CBG: No results for input(s): GLUCAP in the last 168 hours. Lipid Profile: No results for input(s): CHOL, HDL, LDLCALC, TRIG, CHOLHDL, LDLDIRECT in the last 72 hours. Thyroid Function Tests: No results for input(s): TSH, T4TOTAL, FREET4, T3FREE, THYROIDAB in the last 72 hours. Anemia Panel: No results for input(s): VITAMINB12, FOLATE, FERRITIN, TIBC, IRON, RETICCTPCT in the last 72 hours. Urine analysis:    Component Value Date/Time   COLORURINE YELLOW 01/17/2016 1047   APPEARANCEUR CLOUDY (A) 01/17/2016 1047   LABSPEC 1.016 01/17/2016 1047   PHURINE 5.0 01/17/2016 1047   GLUCOSEU NEGATIVE 01/17/2016 1047   GLUCOSEU NEGATIVE 12/30/2012 1103   HGBUR  LARGE (A) 01/17/2016 1047   BILIRUBINUR SMALL (A) 01/17/2016 1047   KETONESUR  NEGATIVE 01/17/2016 1047   PROTEINUR 100 (A) 01/17/2016 1047   UROBILINOGEN 1.0 08/25/2014 0641   NITRITE NEGATIVE 01/17/2016 1047   LEUKOCYTESUR LARGE (A) 01/17/2016 1047    Radiological Exams on Admission: DG Chest 1 View  Result Date: 04/29/2020 CLINICAL DATA:  [place] 2 weeks ago, RIGHT knee pain, history CHF, pulmonary hypertension, diabetes mellitus, hypertension, MI EXAM: PORTABLE CHEST 1 VIEW COMPARISON:  Portable exam 1332 hours compared to 01/11/2020 FINDINGS: Enlargement of cardiac silhouette with vascular congestion. Mediastinal contours normal. BILATERAL pulmonary infiltrates, greatest in RIGHT upper lobe, favor pulmonary edema over infection. No pleural effusion or pneumothorax. Osseous structures unremarkable. IMPRESSION: Enlargement of cardiac silhouette with pulmonary vascular congestion and BILATERAL pulmonary infiltrates, favor pulmonary edema over infection. Electronically Signed   By: Lavonia Dana M.D.   On: 04/29/2020 16:34   DG Knee 1-2 Views Right  Result Date: 04/29/2020 CLINICAL DATA:  Fall 2 weeks prior, right knee pain and swelling EXAM: AP and lateral right knee radiographs. COMPARISON:  04/22/2020 right knee radiographs FINDINGS: Status post right total knee arthroplasty, with no hardware fracture or loosening. Small suprapatellar right knee joint effusion. No bone fracture. No dislocation. No suspicious focal osseous lesions. Vascular calcifications throughout the soft tissues. Small superior and inferior patellar enthesophytes. IMPRESSION: Small suprapatellar right knee joint effusion. No bone fracture or dislocation. Right total knee arthroplasty, with no evidence of hardware complication. Electronically Signed   By: Ilona Sorrel M.D.   On: 04/29/2020 15:44   DG Tibia/Fibula Right  Result Date: 04/29/2020 CLINICAL DATA:  Fall 2 weeks prior, right lower extremity pain and swelling EXAM: AP  and lateral views of the right tibia and fibula COMPARISON:  04/22/2020 right knee radiographs FINDINGS: Status post right total knee arthroplasty with no evidence of hardware fracture or loosening on these views. No bone fracture. No dislocation at the knee or ankle. No focal osseous lesions. No erosions or periosteal reaction. Vascular calcifications throughout the soft tissues. Small Achilles and plantar right calcaneal spurs. Superior and inferior right patellar enthesophytes. IMPRESSION: Right total knee arthroplasty, with no evidence of hardware complication on these views. No bone fracture in the right tibia or fibula. Electronically Signed   By: Ilona Sorrel M.D.   On: 04/29/2020 15:44   VAS [place] LOWER EXTREMITY VENOUS (DVT) (ONLY [facility] & [facility] 7a-7p)  Result Date: 04/29/2020  Lower Venous DVT Study Indications: Swelling.  Risk Factors: None identified. Limitations: Body habitus, poor ultrasound/tissue interface, open wound and leg induration, patient pain tolerance. Comparison Study: No prior studies. Performing Technologist: Oliver Hum RVT  Examination Guidelines: A complete evaluation includes B-mode imaging, spectral Doppler, color Doppler, and power Doppler as needed of all accessible portions of each vessel. Bilateral testing is considered an integral part of a complete examination. Limited examinations for reoccurring indications may be performed as noted. The reflux portion of the exam is performed with the patient in reverse Trendelenburg.  +---------+---------------+---------+-----------+----------+--------------+ RIGHT    CompressibilityPhasicitySpontaneityPropertiesThrombus Aging +---------+---------------+---------+-----------+----------+--------------+ CFV      Full           Yes      Yes                                 +---------+---------------+---------+-----------+----------+--------------+ SFJ      Full                                                         +---------+---------------+---------+-----------+----------+--------------+  FV Prox  Full                                                        +---------+---------------+---------+-----------+----------+--------------+ FV Mid                  Yes      Yes                                 +---------+---------------+---------+-----------+----------+--------------+ FV Distal               Yes      Yes                                 +---------+---------------+---------+-----------+----------+--------------+ PFV      Full                                                        +---------+---------------+---------+-----------+----------+--------------+ POP      Full           Yes      Yes                                 +---------+---------------+---------+-----------+----------+--------------+ PTV      Full                                                        +---------+---------------+---------+-----------+----------+--------------+ PERO     Full                                                        +---------+---------------+---------+-----------+----------+--------------+   +----+---------------+---------+-----------+----------+--------------+ LEFTCompressibilityPhasicitySpontaneityPropertiesThrombus Aging +----+---------------+---------+-----------+----------+--------------+ CFV Full           Yes      Yes                                 +----+---------------+---------+-----------+----------+--------------+     Summary: RIGHT: - There is no evidence of deep vein thrombosis in the lower extremity. However, portions of this examination were limited- see technologist comments above.  - No cystic structure found in the popliteal fossa.  LEFT: - No evidence of common femoral vein obstruction.  *See table(s) above for measurements and observations.    Preliminary     EKG: Personally reviewed. Atrial fibrillation, RBBB and LAFB.  Not significantly changed  when compared to prior.  Assessment/Plan Principal Problem:   ESRD (end stage renal disease) on dialysis [facility]) Active Problems:   Diabetes mellitus with neuropathy (HCC)   Hyperlipidemia   AF (paroxysmal atrial fibrillation) (HCC)   Anemia of chronic renal  failure, unspecified stage   Hyperphosphatemia   Cellulitis of right lower extremity   Daniel Whitney is a 65 y.o. male with medical history significant for ESRD on MWF HD, paroxysmal atrial fibrillation not on anticoagulation, history of SDH 2018, HTN, HLD, diet-controlled T2DM, anemia of chronic kidney disease, OSA not on CPAP, alcohol use disorder who is admitted with RLE cellulitis and volume overload due to nonadherence to dialysis.  ESRD on MWF HD: Presented with worsening peripheral edema due to nonadherence.  Mild pulmonary edema seen on CXR without hypoxia or respiratory symptoms.  Nephrology has seen patient and he was dialyzed prior to admission. -Repeat renal function panel in a.m. -Continue HD per nephrology  Chronic venous stasis of lower extremities with superimposed cellulitis of RLE: Given IV vancomycin, ceftriaxone and clindamycin in the ED.  Will narrow to IV ceftriaxone alone.  Hyperphosphatemia: Underwent dialysis, recheck in AM.  Continue Auryxia.  Paroxysmal atrial fibrillation: In atrial fibrillation with controlled rate on admission.  Not on rate or rhythm controlling medications.  Not on anticoagulation since prior SDH in 2018.  Anemia of chronic kidney disease: Chronic and stable.  Hyperlipidemia: Continue simvastatin.  Depression: Reports depressed mood over the last 3 months leading to nonadherence.  He was previously on Elavil for neuropathy.  Restart Elavil for potential benefit.  Recommend follow-up with PCP.  Can consider addition or change to SSRI such as sertraline.  No active SI/HI.  DVT prophylaxis: Subcutaneous heparin Code Status: Full code, confirmed with patient Family Communication:  Discussed with patient, he has discussed with family Disposition Plan: From home and likely discharge to home in 1-2 days Consults called: Nephrology Level of care: Telemetry Medical Admission status:  Status is: Observation  The patient remains OBS appropriate and will d/c before 2 midnights.  Dispo: The patient is from: Home              Anticipated d/c is to: Home              Patient currently is not medically stable to d/c.   Difficult to place patient No   Zada Finders MD [facility]  If 7PM-7AM, please contact night-coverage [email or web address]  04/29/2020, 11:58 PM

## 2020-04-29 NOTE — Consult Note (Signed)
Reason for Consult: To manage dialysis and dialysis related needs Referring Physician: Dr Deland Pretty is an 65 y.o. male.   HPI: Pt is a 9M with ESRD on HD MWF at [facility] who is well-known to our service.  He has a history of nonadherence to treatment and several cardiac arrests/ near arrests in the setting of hyperkalemia.  Lately he has been struggling with social support/ loss of independence/ loss of caregiver (brother had a stroke) and missing large chunks of treatments.  His last HD treatment according to our OP records was 04/13/20 but he did have one ED rx here 04/22/20.  He is here with swelling and pain and has fallen several times.  His legs are painful.    He reports a low mood.  He acknowledges today that he thinks a lot of his lack of motivation is actually depression.  Dialyzes at [facility] MWF EDW 104.5 4 hr 15 min  F180 dialyzer BFR 450 DFR A1.5 2K/ 3.5 Ca bath AVF Heparin 5000 u bolus venofer 50 mg weekly mircera 200 mcg q 2 weeks, last given 04/04/20   Past Medical History:  Diagnosis Date  . Allergic rhinitis 07/07/2014  . Anemia due to other cause 07/07/2014  . Arthritis    "all over"  . Atrial fibrillation ([place]) 12/30/2012  . CHF (congestive heart failure) ([place]) 12/30/2012  . Chronic lower back pain   . Diabetes mellitus with neuropathy ([place])   . Diabetic retinopathy ([place]) 12/30/2012   no medications  . Dysrhythmia    Afib  . ESRD (end stage renal disease) on dialysis ([place]) started in 2013   MWF; [facility]; [employer]  . GERD (gastroesophageal reflux disease)   . Glaucoma   . History of gout    "right big toe"  . Hyperlipidemia   . Hypertension   . Hypothyroid    01/17/16- no longer on meduication  . Morbid obesity ([place]) 12/30/2012  . Myocardial infarction ([place] [address] [place])    "I've had ~ 3; last one was in ~ 10/2013" (01/30/2014)  . OSA (obstructive sleep apnea) 12/30/2012   "lost weight; no longer needed CPAP; retested said I needed it; didn't followup  cause I was feeling fine" (01/30/2014)  . Pulmonary hypertension ([place]) 12/30/2012  . Refusal of blood transfusions as patient is Jehovah's Witness   . Stroke [facility]) ~ 2005; ~ 2005   "they were mild; I didn't even notice I'd had them"; denies residual on 01/30/2014  . Subdural hematoma (Oct 2018)    Coumadin and heparin placed on hold at dc- pt did not FU w/ NS as recommended after dc so not on warfarin when admitted 04/03/17    Past Surgical History:  Procedure Laterality Date  . AV FISTULA PLACEMENT Left ~ 10/2013   "forearm"  . CARDIAC CATHETERIZATION N/A 01/07/2015   Procedure: Left Heart Cath and Coronary Angiography;  Surgeon: Troy Sine, MD;  Location: [place] [facility];  Service: Cardiovascular;  Laterality: N/A;  . CLOSED REDUCTION HIP DISLOCATION Right 1970's  . JOINT REPLACEMENT    . TOTAL HIP ARTHROPLASTY Right 1980  . TOTAL KNEE ARTHROPLASTY Right 01/24/2016   Procedure: TOTAL KNEE ARTHROPLASTY;  Surgeon: Dorna Leitz, MD;  Location: [place];  Service: Orthopedics;  Laterality: Right;  . TOTAL KNEE ARTHROPLASTY Left 06/26/2016   Procedure: TOTAL KNEE ARTHROPLASTY;  Surgeon: Dorna Leitz, MD;  Location: [place];  Service: Orthopedics;  Laterality: Left;    Family History  Problem Relation Age of Onset  .  Arthritis Other   . Heart disease Other   . Hyperlipidemia Other   . Hypertension Other   . Kidney disease Other   . Diabetes Other   . Stroke Other   . Diabetes Mother   . Cancer Father        bone cancer  . Heart disease Father   . Diabetes Sister   . Diabetes Brother   . Stroke Brother     Social History:  reports that he has never smoked. He has never used smokeless tobacco. He reports previous alcohol use of about 7.0 - 8.0 standard drinks of alcohol per week. He reports that he does not use drugs.  Allergies:  Allergies  Allergen Reactions  . Tobacco [Tobacco] Shortness Of Breath  . Other     Perfume & cologne-eyes itchy     No results found for this  or any previous visit (from the past 48 hour(s)).  No results found.  ROS: all other systems reviewed and are negative except as per HPI  Blood pressure (!) 152/93, pulse 80, temperature (!) 100.6 F (38.1 C), temperature source Oral, resp. rate 17, height '5\' 9"'$  (1.753 m), weight 111.6 kg, SpO2 96 %. . GEN NAD, lying in bed flat HEENT EOMI PERRL NECK no JVD PULM clear bilaterally CV RRR ABD  obese EXT bilateral LE with wounds and redness/ pain NEURO some mild asterixis  Assessment/Plan: 1 Nonadherence to hemodialysis: he has EtOHism and depression.  I think he needs some psychiatric help or at least resources for outpatient support.  Would rec psych c/s here. 2 ESRD: MWF- last OP treatment 3/2 and last Rx 3/11 here.  Will dialyze here today and likely will need more HD tomorrow as well 3.  Hyperkalemia: expect to improve with HD 4.  LE wounds: looks like venous stasis with cellulitis superimposed.  Antibiotics per primary.  Dopplers with no JVD 5 Hypertension: reasonably well controlled at present 6. Anemia of ESRD: ESA last given [id number] Metabolic Bone Disease: Auryxia as binder, no VDRA 8,  Dispo: to be admitted  Madelon Lips 04/29/2020, 2:32 PM

## 2020-04-29 NOTE — Patient Outreach (Signed)
[facility]) Care Management  04/29/2020  Daniel Whitney [birth year] [id number]   Telephone Assessment   Telephone Screen  Referral Date: 04/28/2020 Referral Source: MD Office Referral Reason: "Clinic staff states he is neglecting self-care, has had multiple falls at home, and living in unsanitary condition with feces around the home. Their concern is that he may need SNF placement as he can no longer effectively care for himself. His brother Lindle Marmolejos [phone]) is now involved with facilitating help for him. Last ED visit was 04/22/20 for fall and hyperkalemia"    Outreach attempt # 1 to patient. RN CM spoke with patient briefly as he was getting ready to leave to go to HD tx. RN CM discussed referral source and reason. Patient admits that it is becoming more and more difficult for him to manage his care on his own in his home. He reports he had some falls lately which resulted in ED visits. Patient reports he has limited family support locally. He has a brother(Steve) but stated he was "useless" and "not dependable". Patient did not wish for RN CM to contact brother at this time. Patient voices that his nephew helps him the most but is still not able to provide the extras care that needs. He voices that the rest of his family is in [place]. He states he has another brother who is in a facility ain [place](unable to recall name) and he would like to see if he could get into that facility. RN CM advised patient to obtain name and location of facility. Discussed referral to [facility] for possible assistance with placement and he gave verbal consent. He denies any imminent needs at present. Advised patient that SW would follow up with him. RN CM unable to complete med review due to patient had to prepare to leave for dialysis.    Goals Addressed              This Visit's Progress   .  (THN)Follow My Treatment Plan-Chronic Kidney (pt-stated)        Timeframe:  Long-Range  Goal Priority:  High Start Date:   04/29/2020                          Expected End Date: April  2022                      Follow Up Date June 2022   - ask for help if I can't afford my medicines - keep follow-up appointments - keep taking my medicines, even when I feel good  -adhere to HD scheduled   Why is this important?    Staying as healthy as you can is very important. This may mean making changes if you smoke, don't exercise or eat poorly.   A healthy lifestyle is an important goal for you.   Following the treatment plan and making changes may be hard.   Try some of these steps to help keep the disease from getting worse.     Notes:  3/185/2022-Patient reports he is tolerating HD txs fairly well-denies any issues at present. Voices he is trying to adhere to diet/fluid restrictions.    .  (THN)Home and Family Safety Maintained (pt-stated)          Timeframe:  Long-Range Goal Priority:  High Start Date:    04/29/2020  Expected End Date:   June 2022 Follow Up : April 2022          -patient will maintain safety in the home -patient will discuss options of placement with SW -patient will seek out assistance from family other support people -            Evidence-based guidance:   Identify and review with patient potential safety risks from home or living environment, automobile driving and/or riding, as well as firearms.    Provide anticipatory guidance related to specific risks to safety; brainstorm acceptable strategies to reduce risk.   Identify resources needed to improve or maintain safety.   Provide emergency services contact information; encourage placement of contact information in easy to locate place at home and in wallet, purse or backpack.   Notes:   04/29/2020-Patient admits that it is getting hard for him to maintain safety and care in the home. He is agreeable to SW referral for placement.       Plan: RN CM will follow up  with patient within two weeks. Patient gave verbal consent and in agreement with RN CM follow up and timeframe. Patient aware that they may contact RN CM sooner for any issues or concerns. RN CM reviewed goals and plan of care with patient.Patient agrees to care plan and follow up. RN CM will send quarterly update to PCP.  Enzo Montgomery, RN,BSN,CCM [facility] Telephonic Care Management Coordinator Direct Phone: [phone] Toll Free: [phone] Fax: [phone]

## 2020-04-29 NOTE — H&P (Incomplete)
History and Physical    Daniel Whitney [id number] DOB: 1955/08/17 DOA: 04/29/2020  PCP: [name], MD  Patient coming from: Home via EMS  I have personally briefly reviewed patient's old medical records in [place]  Chief Complaint: Leg pain, missed dialysis  HPI: Atlee Jelks is a 65 y.o. male with medical history significant for ESRD on MWF HD, paroxysmal atrial fibrillation not on anticoagulation, history of SDH 2018, HTN, HLD, diet-controlled T2DM, anemia of chronic kidney disease, OSA not on CPAP who presents to the ED for evaluation of leg pain and missed dialysis.  Patient last underwent dialysis on 04/22/2020 in hospital when he was seen in the ED.  He says he has lost motivation to go to dialysis because he "got sick of it."  Currently lives alone but previously his brother was living with him and helping him out however has since moved to a nursing facility after having a stroke.  Patient states he has had a depressed mood over the last few months which he feels is contributing to his nonadherence to dialysis.  Over the last week he has noticed increased swelling in both of his lower extremities.  He has also had pain in both of his legs.  He has not had any significant shortness of breath.  He denies any chest pain.  He says he only makes a scant amount of urine.  ED Course:  Initial vitals showed BP 163/92, pulse 98, RR 14, temp 100.6 F, SPO2 95% on room air.  Labs showed WBC 9.8, hemoglobin 9.5, platelets 194,000, lactic acid 1.0, CK 34, BNP 1845.6, sodium 131, potassium 5.6, bicarb 22, BUN 98, creatinine 16.75, serum glucose 105, phosphorus 10.8, magnesium 2.2, lipase 41, high-sensitivity troponin I 63.  VBG showed pH 7.236, PCO2 56.1, PO2 48.0.  Blood culture was obtained and pending.  SARS-CoV-2 PCR is negative.  Influenza A/B PCR is negative.  Right tibia/fibula x-ray shows right total knee arthroplasty with no evidence of hardware complication.  No bone  fracture.  Right knee x-ray shows small suprapatellar right knee joint effusion without bone fracture or dislocation.  Portable chest x-ray shows enlarged cardiac silhouette with pulmonary vascular congestion and pulmonary edema.  RLE ultrasound was negative for evidence of DVT.  Exam somewhat limited.  Nephrology were consulted and patient was taken for dialysis.  Patient was felt to have cellulitis of his right lower extremity.  Patient was given IV vancomycin, ceftriaxone, and clindamycin.  The hospitalist service was consulted to admit for further evaluation and management.  Review of Systems: All systems reviewed and are negative except as documented in history of present illness above.   Past Medical History:  Diagnosis Date  . Allergic rhinitis 07/07/2014  . Anemia due to other cause 07/07/2014  . Arthritis    "all over"  . Atrial fibrillation ([place]) 12/30/2012  . CHF (congestive heart failure) ([place]) 12/30/2012  . Chronic lower back pain   . Diabetes mellitus with neuropathy ([place])   . Diabetic retinopathy ([place]) 12/30/2012   no medications  . Dysrhythmia    Afib  . ESRD (end stage renal disease) on dialysis ([place]) started in 2013   MWF; [facility]; [facility]  . GERD (gastroesophageal reflux disease)   . Glaucoma   . History of gout    "right big toe"  . Hyperlipidemia   . Hypertension   . Hypothyroid    01/17/16- no longer on meduication  . Morbid obesity ([place]) 12/30/2012  . Myocardial infarction ([place])    "  I've had ~ 3; last one was in ~ 10/2013" (01/30/2014)  . OSA (obstructive sleep apnea) 12/30/2012   "lost weight; no longer needed CPAP; retested said I needed it; didn't followup cause I was feeling fine" (01/30/2014)  . Pulmonary hypertension ([place]) 12/30/2012  . Refusal of blood transfusions as patient is Jehovah's Witness   . Stroke [facility]) ~ 2005; ~ 2005   "they were mild; I didn't even notice I'd had them"; denies residual on 01/30/2014  . Subdural hematoma (Oct  2018)    Coumadin and heparin placed on hold at dc- pt did not FU w/ NS as recommended after dc so not on warfarin when admitted 04/03/17    Past Surgical History:  Procedure Laterality Date  . AV FISTULA PLACEMENT Left ~ 10/2013   "forearm"  . CARDIAC CATHETERIZATION N/A 01/07/2015   Procedure: Left Heart Cath and Coronary Angiography;  Surgeon: Troy Sine, MD;  Location: [facility];  Service: Cardiovascular;  Laterality: N/A;  . CLOSED REDUCTION HIP DISLOCATION Right 1970's  . JOINT REPLACEMENT    . TOTAL HIP ARTHROPLASTY Right 1980  . TOTAL KNEE ARTHROPLASTY Right 01/24/2016   Procedure: TOTAL KNEE ARTHROPLASTY;  Surgeon: Dorna Leitz, MD;  Location: [facility];  Service: Orthopedics;  Laterality: Right;  . TOTAL KNEE ARTHROPLASTY Left 06/26/2016   Procedure: TOTAL KNEE ARTHROPLASTY;  Surgeon: Dorna Leitz, MD;  Location: [facility];  Service: Orthopedics;  Laterality: Left;    Social History:  reports that he has never smoked. He has never used smokeless tobacco. He reports previous alcohol use of about 7.0 - 8.0 standard drinks of alcohol per week. He reports that he does not use drugs.  Allergies  Allergen Reactions  . Tobacco [Tobacco] Shortness Of Breath  . Other     Perfume & cologne-eyes itchy    Family History  Problem Relation Age of Onset  . Arthritis Other   . Heart disease Other   . Hyperlipidemia Other   . Hypertension Other   . Kidney disease Other   . Diabetes Other   . Stroke Other   . Diabetes Mother   . Cancer Father        bone cancer  . Heart disease Father   . Diabetes Sister   . Diabetes Brother   . Stroke Brother      Prior to Admission medications   Medication Sig Start Date End Date Taking? Authorizing Provider  amitriptyline (ELAVIL) 50 MG tablet Take 1 tablet (50 mg total) by mouth at bedtime. Patient not taking: Reported on 01/12/2020 02/28/18   Rai, Vernelle Emerald, MD  AURYXIA 1 GM 210 MG(Fe) tablet Take 210 mg by mouth 3 (three) times  daily with meals. 11/27/19   [provider]  b complex-vitamin c-folic acid (NEPHRO-VITE) 0.8 MG TABS tablet Take 1 tablet by mouth daily.    [provider]  cinacalcet (SENSIPAR) 30 MG tablet Take 30 mg by mouth daily with supper. 12/23/17   [provider]  gabapentin (NEURONTIN) 100 MG capsule Take 2 capsules (200 mg total) by mouth at bedtime. 200 mg qam and 300 mg upon return from dialysis on M-W-F for neuropathy Patient taking differently: Take 100 mg by mouth at bedtime.  07/31/16   Medina-Vargas, Monina C, NP  simvastatin (ZOCOR) 20 MG tablet Take 1 tablet (20 mg total) by mouth daily. 01/12/20   Kayleen Memos, DO    Physical Exam: Vitals:   04/29/20 2055 04/29/20 2110 04/29/20 2130 04/29/20 2223  BP: 138/75 129/74 (!) 154/87 (!) 156/84  Pulse: 80 74 78 71  Resp: 19 (!) 24 (!) 23 (!) 26  Temp:   98.2 F (36.8 C)   TempSrc:   Oral   SpO2: 99% 100% 99% 99%  Weight:      Height:       Constitutional: Obese man resting supine in bed, NAD, calm, appears somewhat uncomfortable intermittently due to leg pain Eyes: PERRL, lids and conjunctivae normal ENMT: Mucous membranes are moist. Posterior pharynx clear of any exudate or lesions.Normal dentition.  Neck: normal, supple, no masses. Respiratory: clear to auscultation anteriorly.  Normal respiratory effort. No accessory muscle use.  Cardiovascular: Regular rate and rhythm, no murmurs / rubs / gallops.  +2 tight pitting bilateral lower extremity edema.  LUE aVF with palpable thrill. Abdomen: no tenderness, no masses palpated. Bowel sounds positive.  Musculoskeletal: no clubbing / cyanosis. No joint deformity upper and lower extremities. Good ROM, no contractures. Normal muscle tone.  Skin: Venous stasis changes both lower extremities with tight edema, increased erythema and warmth to touch of right lower extremity when compared to left with superficial anterior ulceration without active drainage. Neurologic:  CN 2-12 grossly intact. Sensation intact. Strength 5/5 in all 4.  Psychiatric: Normal judgment and insight. Alert and oriented x 3. Normal mood.       Labs on Admission: I have personally reviewed following labs and imaging studies  CBC: Recent Labs  Lab 04/29/20 1612 04/29/20 1658  WBC 9.8  --   NEUTROABS 7.6  --   HGB 9.5* 11.6*  HCT 28.7* 34.0*  MCV 90.0  --   PLT 194  --    Basic Metabolic Panel: Recent Labs  Lab 04/29/20 1612 04/29/20 1658  NA 131* 130*  K 5.6* 5.6*  CL 91*  --   CO2 22  --   GLUCOSE 105*  --   BUN 98*  --   CREATININE 16.75*  --   CALCIUM 9.6  --   MG 2.2  --   PHOS 10.8*  --    GFR: Estimated Creatinine Clearance: 5.4 mL/min (A) (by C-G formula based on SCr of 16.75 mg/dL (H)). Liver Function Tests: Recent Labs  Lab 04/29/20 1612  AST 7*  ALT 16  ALKPHOS 183*  BILITOT 1.3*  PROT 7.3  ALBUMIN 3.0*   Recent Labs  Lab 04/29/20 1612  LIPASE 41   No results for input(s): AMMONIA in the last 168 hours. Coagulation Profile: Recent Labs  Lab 04/29/20 1612  INR 1.3*   Cardiac Enzymes: Recent Labs  Lab 04/29/20 1612  CKTOTAL 34*   BNP (last 3 results) No results for input(s): PROBNP in the last 8760 hours. HbA1C: No results for input(s): HGBA1C in the last 72 hours. CBG: No results for input(s): GLUCAP in the last 168 hours. Lipid Profile: No results for input(s): CHOL, HDL, LDLCALC, TRIG, CHOLHDL, LDLDIRECT in the last 72 hours. Thyroid Function Tests: No results for input(s): TSH, T4TOTAL, FREET4, T3FREE, THYROIDAB in the last 72 hours. Anemia Panel: No results for input(s): VITAMINB12, FOLATE, FERRITIN, TIBC, IRON, RETICCTPCT in the last 72 hours. Urine analysis:    Component Value Date/Time   COLORURINE YELLOW 01/17/2016 1047   APPEARANCEUR CLOUDY (A) 01/17/2016 1047   LABSPEC 1.016 01/17/2016 1047   PHURINE 5.0 01/17/2016 1047   GLUCOSEU NEGATIVE 01/17/2016 1047   GLUCOSEU NEGATIVE 12/30/2012 1103   HGBUR  LARGE (A) 01/17/2016 1047   BILIRUBINUR SMALL (A) 01/17/2016 1047   KETONESUR  NEGATIVE 01/17/2016 1047   PROTEINUR 100 (A) 01/17/2016 1047   UROBILINOGEN 1.0 08/25/2014 0641   NITRITE NEGATIVE 01/17/2016 1047   LEUKOCYTESUR LARGE (A) 01/17/2016 1047    Radiological Exams on Admission: DG Chest 1 View  Result Date: 04/29/2020 CLINICAL DATA:  [place] 2 weeks ago, RIGHT knee pain, history CHF, pulmonary hypertension, diabetes mellitus, hypertension, MI EXAM: PORTABLE CHEST 1 VIEW COMPARISON:  Portable exam 1332 hours compared to 01/11/2020 FINDINGS: Enlargement of cardiac silhouette with vascular congestion. Mediastinal contours normal. BILATERAL pulmonary infiltrates, greatest in RIGHT upper lobe, favor pulmonary edema over infection. No pleural effusion or pneumothorax. Osseous structures unremarkable. IMPRESSION: Enlargement of cardiac silhouette with pulmonary vascular congestion and BILATERAL pulmonary infiltrates, favor pulmonary edema over infection. Electronically Signed   By: Lavonia Dana M.D.   On: 04/29/2020 16:34   DG Knee 1-2 Views Right  Result Date: 04/29/2020 CLINICAL DATA:  Fall 2 weeks prior, right knee pain and swelling EXAM: AP and lateral right knee radiographs. COMPARISON:  04/22/2020 right knee radiographs FINDINGS: Status post right total knee arthroplasty, with no hardware fracture or loosening. Small suprapatellar right knee joint effusion. No bone fracture. No dislocation. No suspicious focal osseous lesions. Vascular calcifications throughout the soft tissues. Small superior and inferior patellar enthesophytes. IMPRESSION: Small suprapatellar right knee joint effusion. No bone fracture or dislocation. Right total knee arthroplasty, with no evidence of hardware complication. Electronically Signed   By: Ilona Sorrel M.D.   On: 04/29/2020 15:44   DG Tibia/Fibula Right  Result Date: 04/29/2020 CLINICAL DATA:  Fall 2 weeks prior, right lower extremity pain and swelling EXAM: AP  and lateral views of the right tibia and fibula COMPARISON:  04/22/2020 right knee radiographs FINDINGS: Status post right total knee arthroplasty with no evidence of hardware fracture or loosening on these views. No bone fracture. No dislocation at the knee or ankle. No focal osseous lesions. No erosions or periosteal reaction. Vascular calcifications throughout the soft tissues. Small Achilles and plantar right calcaneal spurs. Superior and inferior right patellar enthesophytes. IMPRESSION: Right total knee arthroplasty, with no evidence of hardware complication on these views. No bone fracture in the right tibia or fibula. Electronically Signed   By: Ilona Sorrel M.D.   On: 04/29/2020 15:44   VAS [place] LOWER EXTREMITY VENOUS (DVT) (ONLY [facility] & [facility] 7a-7p)  Result Date: 04/29/2020  Lower Venous DVT Study Indications: Swelling.  Risk Factors: None identified. Limitations: Body habitus, poor ultrasound/tissue interface, open wound and leg induration, patient pain tolerance. Comparison Study: No prior studies. Performing Technologist: Oliver Hum RVT  Examination Guidelines: A complete evaluation includes B-mode imaging, spectral Doppler, color Doppler, and power Doppler as needed of all accessible portions of each vessel. Bilateral testing is considered an integral part of a complete examination. Limited examinations for reoccurring indications may be performed as noted. The reflux portion of the exam is performed with the patient in reverse Trendelenburg.  +---------+---------------+---------+-----------+----------+--------------+ RIGHT    CompressibilityPhasicitySpontaneityPropertiesThrombus Aging +---------+---------------+---------+-----------+----------+--------------+ CFV      Full           Yes      Yes                                 +---------+---------------+---------+-----------+----------+--------------+ SFJ      Full                                                         +---------+---------------+---------+-----------+----------+--------------+  FV Prox  Full                                                        +---------+---------------+---------+-----------+----------+--------------+ FV Mid                  Yes      Yes                                 +---------+---------------+---------+-----------+----------+--------------+ FV Distal               Yes      Yes                                 +---------+---------------+---------+-----------+----------+--------------+ PFV      Full                                                        +---------+---------------+---------+-----------+----------+--------------+ POP      Full           Yes      Yes                                 +---------+---------------+---------+-----------+----------+--------------+ PTV      Full                                                        +---------+---------------+---------+-----------+----------+--------------+ PERO     Full                                                        +---------+---------------+---------+-----------+----------+--------------+   +----+---------------+---------+-----------+----------+--------------+ LEFTCompressibilityPhasicitySpontaneityPropertiesThrombus Aging +----+---------------+---------+-----------+----------+--------------+ CFV Full           Yes      Yes                                 +----+---------------+---------+-----------+----------+--------------+     Summary: RIGHT: - There is no evidence of deep vein thrombosis in the lower extremity. However, portions of this examination were limited- see technologist comments above.  - No cystic structure found in the popliteal fossa.  LEFT: - No evidence of common femoral vein obstruction.  *See table(s) above for measurements and observations.    Preliminary     EKG: Personally reviewed. Atrial fibrillation, RBBB and LAFB.  Not significantly changed  when compared to prior.  Assessment/Plan Principal Problem:   ESRD (end stage renal disease) on dialysis [facility]) Active Problems:   Diabetes mellitus with neuropathy (HCC)   Hyperlipidemia   AF (paroxysmal atrial fibrillation) (HCC)   Anemia of chronic renal  failure, unspecified stage   Hyperphosphatemia   Cellulitis of right lower extremity   Alexandria Sanocki is a 65 y.o. male with medical history significant for ESRD on MWF HD, paroxysmal atrial fibrillation not on anticoagulation, history of SDH 2018, HTN, HLD, diet-controlled T2DM, anemia of chronic kidney disease, OSA not on CPAP, alcohol use disorder who is admitted with RLE cellulitis and volume overload due to nonadherence to dialysis.  ESRD on MWF HD: ***  Chronic venous stasis of lower extremities with superimposed cellulitis of RLE: ***  Hyperphosphatemia: ***  Paroxysmal atrial fibrillation: ***  Anemia of chronic kidney disease: ***  Hyperlipidemia: ***  Depression: ***  DVT prophylaxis: ***  Code Status: ***  Family Communication: ***  Disposition Plan: ***  Consults called: ***  Level of care: Telemetry Medical Admission status: ***   Status is: Observation  {Observation:23811}  Dispo: The patient is from: {From:23814}              Anticipated d/c is to: {To:23815}              Patient currently {Medically stable:23817}   Difficult to place patient {Yes/No:25151}   Zada Finders MD [facility]  If 7PM-7AM, please contact night-coverage [email or web address]  04/29/2020, 11:58 PM

## 2020-04-29 NOTE — ED Notes (Signed)
Pt has 3+ swelling of right knee, 1+ right pedal pulse, warm to touch, able to move extremities.

## 2020-04-29 NOTE — ED Notes (Addendum)
Patient transported to XR. 

## 2020-04-29 NOTE — Telephone Encounter (Signed)
   Gurnie Surgener DOB: 08-18-1955 MRN: [id number]   RIDER WAIVER AND RELEASE OF LIABILITY  For purposes of improving physical access to our facilities, [facility] is pleased to partner with third parties to provide [facility] patients or other authorized individuals the option of convenient, on-demand ground transportation services (the [place]") through use of the technology service that enables users to request on-demand ground transportation from independent third-party providers.  By opting to use and accept these [employer], I, the undersigned, hereby agree on behalf of myself, and on behalf of any minor child using the [employer] for whom I am the parent or legal guardian, as follows:  1. Government social research officer provided to me are provided by independent third-party transportation providers who are not [employer] or employees and who are unaffiliated with [employer]. 2. [facility] is neither a transportation carrier nor a common or public carrier. 3. [facility] has no control over the quality or safety of the transportation that occurs as a result of the [employer]. 4. [facility] cannot guarantee that any third-party transportation provider will complete any arranged transportation service. 5. [facility] makes no representation, warranty, or guarantee regarding the reliability, timeliness, quality, safety, suitability, or availability of any of the Transport Services or that they will be error free. 6. I fully understand that traveling by vehicle involves risks and dangers of serious bodily injury, including permanent disability, paralysis, and death. I agree, on behalf of myself and on behalf of any minor child using the Transport Services for whom I am the parent or legal guardian, that the entire risk arising out of my use of the [employer] remains solely with me, to the maximum extent permitted under applicable law. 7. The [employer]  are provided "as is" and "as available." [facility] disclaims all representations and warranties, express, implied or statutory, not expressly set out in these terms, including the implied warranties of merchantability and fitness for a particular purpose. 8. I hereby waive and release [facility], its agents, employees, officers, directors, representatives, insurers, attorneys, assigns, successors, subsidiaries, and affiliates from any and all past, present, or future claims, demands, liabilities, actions, causes of action, or suits of any kind directly or indirectly arising from acceptance and use of the [employer]. 9. I further waive and release [facility] and its affiliates from all present and future liability and responsibility for any injury or death to persons or damages to property caused by or related to the use of the [employer]. 10. I have read this Waiver and Release of Liability, and I understand the terms used in it and their legal significance. This Waiver is freely and voluntarily given with the understanding that my right (as well as the right of any minor child for whom I am the parent or legal guardian using the [employer]) to legal recourse against [place] [facility] in connection with the [employer] is knowingly surrendered in return for use of these services.   I attest that I read the consent document to Reece Agar, gave Mr. Nelms the opportunity to ask questions and answered the questions asked (if any). I affirm that Reece Agar then provided consent for he's participation in this program.     Legrand Pitts

## 2020-04-29 NOTE — ED Notes (Signed)
I attempted to get pt's blood 3 times, but was unsuccessful. I notified phlebotomy to try.

## 2020-04-29 NOTE — ED Notes (Signed)
DSS at bedside. 

## 2020-04-29 NOTE — Progress Notes (Signed)
Right lower extremity venous duplex has been completed. Preliminary results can be found in CV Proc through chart review.  Results were given to [place] PA.  04/29/20 3:20 PM Carlos Levering RVT

## 2020-04-29 NOTE — ED Provider Notes (Signed)
Clinical Course as of 04/29/20 2221  Fri Apr 29, 2020  1552 Pt signed out by Dr Johnney Killian.  Briefly 65 yo male w/ ESRD on dialysis, has not gone for 12 days, presenting to the ED with shortness of breath and right leg redness and swelling.  DVT study negative.  Difficulty with IV access.  Team attempting access now, anticipate likely admission for infection and/or dialysis [MT]    Clinical Course User Index [MT] Dominick Morella, Carola Rhine, MD   Patient was taken for dialysis at the time of signout.  He returns to the ED continuing to complain of leg pain.  Both legs have pitting edema, but the right is warmer with more swelling.  This could be consistent with infection or cellulitis rather than simple stasis dermatitis.  Labs reviewed - WBC 9.8, lactate 1.0.  Doubt this is sepsis or nec fasc.  We'll start on IV clindamycin. Given the extent of the infection and his comorbidities, we'll admit to the hospital for observation.  Admitted to Dr Posey Pronto hospitalist.   Wyvonnia Dusky, MD 04/29/20 2238

## 2020-04-29 NOTE — ED Triage Notes (Addendum)
Pt arrived via [facility] from home for c/o bila leg painx2 wks. Per EMS pt has had 4 falls in the last 2 wks. Pt goes to dialysis MWF, but hasn't gone in 6 days. Pt last went to dialysis on sat here at the hosp. Per EMS pt is having transportation issues to get to dialysis ctr. Per EMS pt's right knee is more swollen than the left. Pt hasn't taken his medicines in a week. Pt is A&Ox4.

## 2020-04-29 NOTE — ED Provider Notes (Signed)
[facility] EMERGENCY DEPARTMENT Provider Note   CSN: [id number] Arrival date & time:        History Chief Complaint  Patient presents with  . Leg Pain    Daniel Whitney is a 65 y.o. male.  HPI Is dialysis patient with Monday Wednesday Friday schedule.  He reports he has missed dialysis since Saturday before last.  He is at almost 2 weeks without dialysis.  Patient denies he is feeling short of breath.  Denies he is having chest pain.  He reports he is having problems with a lot of pain in his right leg and knee.  About a week ago he stopped being able to put weight on it.  He reports he has been using some crutches to kind to get around but as of the past 2 days he really has not been able to get around at all.  He reports is very painful to straighten the leg or to try to put any weight on it.  He reports is gotten quite swollen.  He reports he has had a number of falls although he does not identify specifically injuring the leg in any fall.  Reports the falls seem to be happening because he is off balance and generally weak.  He reports he had a lot of trouble taking care of himself lately.  He reports he is really lost interest in the continued requirements of his health care.  He reports he used to be very active and independent and now he has been on dialysis for 7 years.  He reports that the lack of ability to care for himself has been very demoralizing.  He reports he quit taking his medications about 7 days ago and has been noncompliant with dialysis.  He reports he knows that nothing is going to get better by ignoring these things and that he has to get back to maintaining his health.  He used to have a brother that lives with him and was helpful in his healthcare, however that brother had a stroke and is in a nursing home now.  Reports his other brother who lives locally is not a lot of help.  He reports 3 sisters died in 05-14-2015.  He reports the rest of his family lives in  [place].  He reports they would like him to come to [facility] but he does not want to make that transition.    Past Medical History:  Diagnosis Date  . Allergic rhinitis 07/07/2014  . Anemia due to other cause 07/07/2014  . Arthritis    "all over"  . Atrial fibrillation ([place]) 12/30/2012  . CHF (congestive heart failure) ([place]) 12/30/2012  . Chronic lower back pain   . Diabetes mellitus with neuropathy ([place])   . Diabetic retinopathy ([place]) 12/30/2012   no medications  . Dysrhythmia    Afib  . ESRD (end stage renal disease) on dialysis ([place]) started in 05-14-11   MWF; [facility]; [employer]  . GERD (gastroesophageal reflux disease)   . Glaucoma   . History of gout    "right big toe"  . Hyperlipidemia   . Hypertension   . Hypothyroid    01/17/16- no longer on meduication  . Morbid obesity ([place]) 12/30/2012  . Myocardial infarction ([place])    "I've had ~ 3; last one was in ~ 10/2013" (01/30/2014)  . OSA (obstructive sleep apnea) 12/30/2012   "lost weight; no longer needed CPAP; retested said I needed it; didn't  followup cause I was feeling fine" (01/30/2014)  . Pulmonary hypertension ([place]) 12/30/2012  . Refusal of blood transfusions as patient is Jehovah's Witness   . Stroke [facility]) ~ 2005; ~ 2005   "they were mild; I didn't even notice I'd had them"; denies residual on 01/30/2014  . Subdural hematoma (Oct 2018)    Coumadin and heparin placed on hold at dc- pt did not FU w/ NS as recommended after dc so not on warfarin when admitted 04/03/17    Patient Active Problem List   Diagnosis Date Noted  . Noncompliance of patient with renal dialysis ([place]) 01/11/2020  . Prolonged QT interval 02/15/2019  . Shock ([place]) 02/24/2018  . Hyperkalemia, diminished renal excretion 01/01/2018  . Syncope and collapse 04/03/2017  . History of CVA (cerebrovascular accident) 04/03/2017  . CKD (chronic kidney disease) stage V requiring chronic dialysis ([place]) 04/03/2017  . Hypothyroid 04/03/2017  .  Refusal of blood transfusions as patient is Jehovah's Witness 04/03/2017  . Prediabetes-HgbA1c 6.2 04/03/2017  . History of subdural hematoma Oct 2018 04/03/2017  . 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 Chronic atrial fibrillation 04/03/2017  . Subdural hematoma ([place]) 11/29/2016  . Peripheral neuropathy 07/26/2016  . At risk for adverse drug reaction 07/03/2016  . Primary osteoarthritis of left knee 06/26/2016  . Postoperative anemia due to acute blood loss 01/27/2016  . S/P knee replacement 01/24/2016  . Low back pain 02/09/2015  . Bilateral knee pain 01/27/2015  . Atypical atrial flutter ([place]) 01/24/2015  . Acute on chronic combined systolic and diastolic heart failure ([place]) 01/05/2015  . Hyperkalemia 11/08/2014  . Hypocalcemia 11/08/2014  . Recurrent falls 10/13/2014  . Venous stasis ulcer ([place]) 10/13/2014  . Hypoxia   . Leukocytosis   . Respiratory failure ([place])   . ARDS (adult respiratory distress syndrome) ([place])   . Encounter for central line placement   . Cardiopulmonary arrest ([place])   . Compression fracture of L3 lumbar vertebra 08/04/2014  . Anemia of chronic renal failure, unspecified stage 07/07/2014  . Allergic rhinitis 07/07/2014  . Anemia of chronic disease 07/07/2014  . Right rotator cuff tear 04/27/2014  . Acute gouty arthritis 11/11/2013  . Hearing loss in right ear 11/11/2013  . Chronic pain syndrome 11/11/2013  . Right shoulder pain 11/11/2013  . Facial rash 11/01/2013  . Right otitis externa 10/27/2013  . Pulmonary HTN- PA 58 mmHg 04/16/13 09/17/2013  . Right hip pain 08/04/2013  . DOE (dyspnea on exertion) 06/18/2013  . Allergic rhinitis, cause unspecified 06/18/2013  . Carpal tunnel syndrome 06/06/2013  . Primary localized osteoarthrosis, lower leg 06/04/2013  . Preoperative cardiovascular examination 03/10/2013  . Preventative health care 12/30/2012  . AF (paroxysmal atrial fibrillation) ([place]) 12/30/2012  .  CHF (congestive heart failure) ([place]) 12/30/2012  . Diabetic retinopathy ([place]) 12/30/2012  . Morbid obesity ([place]) 12/30/2012  . OSA (obstructive sleep apnea)- on C-pap 12/30/2012  . Bilateral hearing loss 12/30/2012  . Arthritis   . Glaucoma   . Diabetes mellitus with neuropathy ([place])   . Hypertension   . ESRD (end stage renal disease) on dialysis ([place])   . Stroke ([place])   . Hyperlipidemia     Past Surgical History:  Procedure Laterality Date  . AV FISTULA PLACEMENT Left ~ 10/2013   "forearm"  . CARDIAC CATHETERIZATION N/A 01/07/2015   Procedure: Left Heart Cath and Coronary Angiography;  Surgeon: Troy Sine, MD;  Location: [facility] CV LAB;  Service: Cardiovascular;  Laterality: N/A;  .  CLOSED REDUCTION HIP DISLOCATION Right 1970's  . JOINT REPLACEMENT    . TOTAL HIP ARTHROPLASTY Right 1980  . TOTAL KNEE ARTHROPLASTY Right 01/24/2016   Procedure: TOTAL KNEE ARTHROPLASTY;  Surgeon: Dorna Leitz, MD;  Location: [facility];  Service: Orthopedics;  Laterality: Right;  . TOTAL KNEE ARTHROPLASTY Left 06/26/2016   Procedure: TOTAL KNEE ARTHROPLASTY;  Surgeon: Dorna Leitz, MD;  Location: [facility];  Service: Orthopedics;  Laterality: Left;       Family History  Problem Relation Age of Onset  . Arthritis Other   . Heart disease Other   . Hyperlipidemia Other   . Hypertension Other   . Kidney disease Other   . Diabetes Other   . Stroke Other   . Diabetes Mother   . Cancer Father        bone cancer  . Heart disease Father   . Diabetes Sister   . Diabetes Brother   . Stroke Brother     Social History   Tobacco Use  . Smoking status: Never Smoker  . Smokeless tobacco: Never Used  Vaping Use  . Vaping Use: Never used  Substance Use Topics  . Alcohol use: Not Currently    Alcohol/week: 7.0 - 8.0 standard drinks    Types: 1 Cans of beer, 3 Shots of liquor, 3 - 4 Standard drinks or equivalent per week  . Drug use: No    Home Medications Prior to Admission medications    Medication Sig Start Date End Date Taking? Authorizing Provider  amitriptyline (ELAVIL) 50 MG tablet Take 1 tablet (50 mg total) by mouth at bedtime. Patient not taking: Reported on 01/12/2020 02/28/18   Rai, Vernelle Emerald, MD  AURYXIA 1 GM 210 MG(Fe) tablet Take 210 mg by mouth 3 (three) times daily with meals. 11/27/19   [provider]  b complex-vitamin c-folic acid (NEPHRO-VITE) 0.8 MG TABS tablet Take 1 tablet by mouth daily.    [provider]  cinacalcet (SENSIPAR) 30 MG tablet Take 30 mg by mouth daily with supper. 12/23/17   [provider]  gabapentin (NEURONTIN) 100 MG capsule Take 2 capsules (200 mg total) by mouth at bedtime. 200 mg qam and 300 mg upon return from dialysis on M-W-F for neuropathy Patient taking differently: Take 100 mg by mouth at bedtime.  07/31/16   Medina-Vargas, Monina C, NP  simvastatin (ZOCOR) 20 MG tablet Take 1 tablet (20 mg total) by mouth daily. 01/12/20   Kayleen Memos, DO    Allergies    Tobacco [tobacco] and Other  Review of Systems   Review of Systems 10 systems reviewed and negative except as per HPI Physical Exam Updated Vital Signs BP (!) 143/85   Pulse 85   Temp (!) 100.6 F (38.1 C) (Oral)   Resp (!) 23   Ht '5\' 9"'$  (1.753 m)   Wt 111.6 kg   SpO2 91%   BMI 36.33 kg/m   Physical Exam Constitutional:      Comments: Alert.  Clear mental status.  No respiratory distress at rest.  HENT:     Mouth/Throat:     Pharynx: Oropharynx is clear.  Eyes:     Extraocular Movements: Extraocular movements intact.  Cardiovascular:     Rate and Rhythm: Normal rate and regular rhythm.  Pulmonary:     Effort: Pulmonary effort is normal.     Breath sounds: Normal breath sounds.  Abdominal:     General: There is no distension.     Palpations:  Abdomen is soft.     Tenderness: There is no abdominal tenderness. There is no guarding.  Musculoskeletal:     Comments: Patient has large swelling of the lower leg on the right as  well as at the knee and thigh.  He has firm, indurated, erythematous and warm to the touch swelling that goes to the groin fold but does not involve the groin fold.  The high thigh at the groin fold is soft and pliable.  He does not have any pain to palpation in the perineum or testicles.  No significant rash or skin involvement above the high thigh.  See attached images.  Patient has chronic dry ulcerative wounds of the lower leg.  Skin:    General: Skin is warm and dry.  Neurological:     General: No focal deficit present.     Mental Status: He is oriented to person, place, and time.     Comments: Patient is alert and appropriate.  He follows commands appropriately.  He is limited by body habitus.  He can use both upper extremities to help roll himself in the bed.  He can move both lower extremities at will except that there is pain limitation to range of motion of the right lower extremity.       ED Results / Procedures / Treatments   Labs (all labs ordered are listed, but only abnormal results are displayed) Labs Reviewed  RESP PANEL BY RT-PCR (FLU A&B, COVID) ARPGX2  CULTURE, BLOOD (ROUTINE X 2)  CULTURE, BLOOD (ROUTINE X 2)  LACTIC ACID, PLASMA  LACTIC ACID, PLASMA  CBC WITH DIFFERENTIAL/PLATELET  BLOOD GAS, VENOUS  CBC WITH DIFFERENTIAL/PLATELET  PROTIME-INR  BRAIN NATRIURETIC PEPTIDE  CK  COMPREHENSIVE METABOLIC PANEL  LIPASE, BLOOD  MAGNESIUM  PHOSPHORUS  TROPONIN I (HIGH SENSITIVITY)    EKG EKG Interpretation  Date/Time:  Friday April 29 2020 12:02:44 EDT Ventricular Rate:  87 PR Interval:    QRS Duration: 152 QT Interval:  400 QTC Calculation: 482 R Axis:   -110 Text Interpretation: Atrial fibrillation RBBB and LAFB Inferior infarct, old Lateral leads are also involved agree. prior tracing has IVCD of different morphology Confirmed by Charlesetta Shanks [phone]) on 04/29/2020 3:18:26 PM   Radiology DG Knee 1-2 Views Right  Result Date: 04/29/2020 CLINICAL DATA:   Fall 2 weeks prior, right knee pain and swelling EXAM: AP and lateral right knee radiographs. COMPARISON:  04/22/2020 right knee radiographs FINDINGS: Status post right total knee arthroplasty, with no hardware fracture or loosening. Small suprapatellar right knee joint effusion. No bone fracture. No dislocation. No suspicious focal osseous lesions. Vascular calcifications throughout the soft tissues. Small superior and inferior patellar enthesophytes. IMPRESSION: Small suprapatellar right knee joint effusion. No bone fracture or dislocation. Right total knee arthroplasty, with no evidence of hardware complication. Electronically Signed   By: Ilona Sorrel M.D.   On: 04/29/2020 15:44   DG Tibia/Fibula Right  Result Date: 04/29/2020 CLINICAL DATA:  Fall 2 weeks prior, right lower extremity pain and swelling EXAM: AP and lateral views of the right tibia and fibula COMPARISON:  04/22/2020 right knee radiographs FINDINGS: Status post right total knee arthroplasty with no evidence of hardware fracture or loosening on these views. No bone fracture. No dislocation at the knee or ankle. No focal osseous lesions. No erosions or periosteal reaction. Vascular calcifications throughout the soft tissues. Small Achilles and plantar right calcaneal spurs. Superior and inferior right patellar enthesophytes. IMPRESSION: Right total knee arthroplasty, with no evidence of  hardware complication on these views. No bone fracture in the right tibia or fibula. Electronically Signed   By: Ilona Sorrel M.D.   On: 04/29/2020 15:44   VAS [place] LOWER EXTREMITY VENOUS (DVT) (ONLY [facility] & [facility] 7a-7p)  Result Date: 04/29/2020  Lower Venous DVT Study Indications: Swelling.  Risk Factors: None identified. Limitations: Body habitus, poor ultrasound/tissue interface, open wound and leg induration, patient pain tolerance. Comparison Study: No prior studies. Performing Technologist: Oliver Hum RVT  Examination Guidelines: A complete evaluation  includes B-mode imaging, spectral Doppler, color Doppler, and power Doppler as needed of all accessible portions of each vessel. Bilateral testing is considered an integral part of a complete examination. Limited examinations for reoccurring indications may be performed as noted. The reflux portion of the exam is performed with the patient in reverse Trendelenburg.  +---------+---------------+---------+-----------+----------+--------------+ RIGHT    CompressibilityPhasicitySpontaneityPropertiesThrombus Aging +---------+---------------+---------+-----------+----------+--------------+ CFV      Full           Yes      Yes                                 +---------+---------------+---------+-----------+----------+--------------+ SFJ      Full                                                        +---------+---------------+---------+-----------+----------+--------------+ FV Prox  Full                                                        +---------+---------------+---------+-----------+----------+--------------+ FV Mid                  Yes      Yes                                 +---------+---------------+---------+-----------+----------+--------------+ FV Distal               Yes      Yes                                 +---------+---------------+---------+-----------+----------+--------------+ PFV      Full                                                        +---------+---------------+---------+-----------+----------+--------------+ POP      Full           Yes      Yes                                 +---------+---------------+---------+-----------+----------+--------------+ PTV      Full                                                        +---------+---------------+---------+-----------+----------+--------------+  PERO     Full                                                         +---------+---------------+---------+-----------+----------+--------------+   +----+---------------+---------+-----------+----------+--------------+ LEFTCompressibilityPhasicitySpontaneityPropertiesThrombus Aging +----+---------------+---------+-----------+----------+--------------+ CFV Full           Yes      Yes                                 +----+---------------+---------+-----------+----------+--------------+     Summary: RIGHT: - There is no evidence of deep vein thrombosis in the lower extremity. However, portions of this examination were limited- see technologist comments above.  - No cystic structure found in the popliteal fossa.  LEFT: - No evidence of common femoral vein obstruction.  *See table(s) above for measurements and observations.    Preliminary     Procedures Procedures  Angiocath insertion Performed by: Charlesetta Shanks  Consent: Verbal consent obtained. Risks and benefits: risks, benefits and alternatives were discussed Time out: Immediately prior to procedure a "time out" was called to verify the correct patient, procedure, equipment, support staff and site/side marked as required.  Preparation: Patient was prepped and draped in the usual sterile fashion.  Vein Location: right AC  Yes Ultrasound Guided  Gauge: 20g  Normal blood return and flush without difficulty Patient tolerance: Patient tolerated the procedure well with no immediate complications.  CRITICAL CARE Performed by: Charlesetta Shanks   Total critical care time: 30 minutes  Critical care time was exclusive of separately billable procedures and treating other patients.  Critical care was necessary to treat or prevent imminent or life-threatening deterioration.  Critical care was time spent personally by me on the following activities: development of treatment plan with patient and/or surrogate as well as nursing, discussions with consultants, evaluation of patient's response to treatment,  examination of patient, obtaining history from patient or surrogate, ordering and performing treatments and interventions, ordering and review of laboratory studies, ordering and review of radiographic studies, pulse oximetry and re-evaluation of patient's condition.  Medications Ordered in ED Medications  Chlorhexidine Gluconate Cloth 2 % PADS 6 each (has no administration in time range)  pentafluoroprop-tetrafluoroeth (GEBAUERS) aerosol 1 application (has no administration in time range)  lidocaine (PF) (XYLOCAINE) 1 % injection 5 mL (has no administration in time range)  lidocaine-prilocaine (EMLA) cream 1 application (has no administration in time range)  0.9 %  sodium chloride infusion (has no administration in time range)  0.9 %  sodium chloride infusion (has no administration in time range)  heparin injection 1,000 Units (has no administration in time range)  alteplase (CATHFLO ACTIVASE) injection 2 mg (has no administration in time range)    ED Course  I have reviewed the triage vital signs and the nursing notes.  Pertinent labs & imaging results that were available during my care of the patient were reviewed by me and considered in my medical decision making (see chart for details).  Clinical Course as of 04/29/20 1611  Fri Apr 29, 2020  1552 Pt signed out by Dr Johnney Killian.  Briefly 65 yo male w/ ESRD on dialysis, has not gone for 12 days, presenting to the ED with shortness of breath and right leg redness and swelling.  DVT study negative.  Difficulty with IV access.  Team attempting access now, anticipate likely admission for infection and/or dialysis [MT]    Clinical Course User Index [MT] Trifan, Carola Rhine, MD   MDM Rules/Calculators/A&P                         Patient presents with several problems.  He has severe comorbid conditions of ESRD on dialysis.  Patient has not had dialysis for almost 2 weeks.  Other issue is right lower extremity pain with significant swelling.   Patient has had several falls although he is denying any specific injury to the extremity.  He has firm, swollen calf, popliteal fossa and mid thigh.  Concern for possible DVT or cellulitis.  Also, possible knee or lower leg fracture.  Will need to proceed with ultrasound and x-rays.  Patient has low-grade fever of 100.6.  Patient is denying any active chest pain or shortness of breath.  At this time do not have high suspicion for PE based on patient's history.  He is not in acute respiratory failure.  He is speaking in full sentences without difficulty.  No significant crackles.  Nursing had difficulty establishing peripheral IV.  IV team did establish IV with delay in obtaining lab work.  They subsequently advised they did not obtain enough blood for full labs as ordered.  I have rechecked on the patient, he remains alert and without acute distress.  He does not have additional pain aside for what persists in his knee and leg.  Continues to deny chest pain or shortness of breath.  We will proceed with diagnostic evaluation as planned.  We will need to rule out DVT however anticipate admission for extensive lower extremity cellulitis with significant pain if no DVT present.  Final Clinical Impression(s) / ED Diagnoses Final diagnoses:  ESRD (end stage renal disease) ([employer])  Right leg pain  Right leg swelling  Cellulitis of right lower extremity    Rx / DC Orders ED Discharge Orders    None       Charlesetta Shanks, MD 04/29/20 1611

## 2020-04-30 DIAGNOSIS — M25561 Pain in right knee: Secondary | ICD-10-CM | POA: Diagnosis present

## 2020-04-30 DIAGNOSIS — E669 Obesity, unspecified: Secondary | ICD-10-CM | POA: Diagnosis present

## 2020-04-30 DIAGNOSIS — D631 Anemia in chronic kidney disease: Secondary | ICD-10-CM | POA: Diagnosis present

## 2020-04-30 DIAGNOSIS — J811 Chronic pulmonary edema: Secondary | ICD-10-CM | POA: Diagnosis present

## 2020-04-30 DIAGNOSIS — Z20822 Contact with and (suspected) exposure to covid-19: Secondary | ICD-10-CM | POA: Diagnosis present

## 2020-04-30 DIAGNOSIS — E1142 Type 2 diabetes mellitus with diabetic polyneuropathy: Secondary | ICD-10-CM | POA: Diagnosis present

## 2020-04-30 DIAGNOSIS — Z9115 Patient's noncompliance with renal dialysis: Secondary | ICD-10-CM | POA: Diagnosis not present

## 2020-04-30 DIAGNOSIS — E875 Hyperkalemia: Secondary | ICD-10-CM | POA: Diagnosis present

## 2020-04-30 DIAGNOSIS — Z6833 Body mass index (BMI) 33.0-33.9, adult: Secondary | ICD-10-CM | POA: Diagnosis not present

## 2020-04-30 DIAGNOSIS — I83018 Varicose veins of right lower extremity with ulcer other part of lower leg: Secondary | ICD-10-CM | POA: Diagnosis present

## 2020-04-30 DIAGNOSIS — Z9119 Patient's noncompliance with other medical treatment and regimen: Secondary | ICD-10-CM | POA: Diagnosis not present

## 2020-04-30 DIAGNOSIS — E1122 Type 2 diabetes mellitus with diabetic chronic kidney disease: Secondary | ICD-10-CM | POA: Diagnosis present

## 2020-04-30 DIAGNOSIS — L03115 Cellulitis of right lower limb: Secondary | ICD-10-CM | POA: Diagnosis present

## 2020-04-30 DIAGNOSIS — G4733 Obstructive sleep apnea (adult) (pediatric): Secondary | ICD-10-CM | POA: Diagnosis present

## 2020-04-30 DIAGNOSIS — E876 Hypokalemia: Secondary | ICD-10-CM | POA: Diagnosis present

## 2020-04-30 DIAGNOSIS — I83028 Varicose veins of left lower extremity with ulcer other part of lower leg: Secondary | ICD-10-CM | POA: Diagnosis present

## 2020-04-30 DIAGNOSIS — N186 End stage renal disease: Secondary | ICD-10-CM | POA: Diagnosis present

## 2020-04-30 DIAGNOSIS — E8770 Fluid overload, unspecified: Secondary | ICD-10-CM | POA: Diagnosis present

## 2020-04-30 DIAGNOSIS — Z992 Dependence on renal dialysis: Secondary | ICD-10-CM | POA: Diagnosis not present

## 2020-04-30 DIAGNOSIS — E785 Hyperlipidemia, unspecified: Secondary | ICD-10-CM | POA: Diagnosis present

## 2020-04-30 DIAGNOSIS — I132 Hypertensive heart and chronic kidney disease with heart failure and with stage 5 chronic kidney disease, or end stage renal disease: Secondary | ICD-10-CM | POA: Diagnosis present

## 2020-04-30 DIAGNOSIS — F32A Depression, unspecified: Secondary | ICD-10-CM | POA: Diagnosis present

## 2020-04-30 LAB — CBC
HCT: 31.5 % — ABNORMAL LOW (ref 39.0–52.0)
Hemoglobin: 10.2 g/dL — ABNORMAL LOW (ref 13.0–17.0)
MCH: 29.7 pg (ref 26.0–34.0)
MCHC: 32.4 g/dL (ref 30.0–36.0)
MCV: 91.6 fL (ref 80.0–100.0)
Platelets: 196 10*3/uL (ref 150–400)
RBC: 3.44 MIL/uL — ABNORMAL LOW (ref 4.22–5.81)
RDW: 18.1 % — ABNORMAL HIGH (ref 11.5–15.5)
WBC: 8.2 10*3/uL (ref 4.0–10.5)
nRBC: 0 % (ref 0.0–0.2)

## 2020-04-30 LAB — RENAL FUNCTION PANEL
Albumin: 2.8 g/dL — ABNORMAL LOW (ref 3.5–5.0)
Anion gap: 15 (ref 5–15)
BUN: 45 mg/dL — ABNORMAL HIGH (ref 8–23)
CO2: 26 mmol/L (ref 22–32)
Calcium: 9.5 mg/dL (ref 8.9–10.3)
Chloride: 94 mmol/L — ABNORMAL LOW (ref 98–111)
Creatinine, Ser: 9.95 mg/dL — ABNORMAL HIGH (ref 0.61–1.24)
GFR, Estimated: 5 mL/min — ABNORMAL LOW (ref >60)
Glucose, Bld: 137 mg/dL — ABNORMAL HIGH (ref 70–99)
Phosphorus: 7.5 mg/dL — ABNORMAL HIGH (ref 2.5–4.6)
Potassium: 4.2 mmol/L (ref 3.5–5.1)
Sodium: 135 mmol/L (ref 135–145)

## 2020-04-30 LAB — HIV ANTIBODY (ROUTINE TESTING W REFLEX): HIV Screen 4th Generation wRfx: NONREACTIVE

## 2020-04-30 MED ORDER — GABAPENTIN 100 MG PO CAPS
100.0000 mg | ORAL_CAPSULE | Freq: Every day | ORAL | Status: DC
  Administered 2020-05-01 – 2020-05-09 (×10): 100 mg via ORAL
  Filled 2020-04-30 (×10): qty 1

## 2020-04-30 MED ORDER — CINACALCET HCL 30 MG PO TABS
30.0000 mg | ORAL_TABLET | Freq: Every day | ORAL | Status: DC
  Administered 2020-04-30 – 2020-05-09 (×10): 30 mg via ORAL
  Filled 2020-04-30 (×10): qty 1

## 2020-04-30 MED ORDER — ACETAMINOPHEN 650 MG RE SUPP: 650.0000 mg | Freq: Four times a day (QID) | RECTAL | Status: DC | PRN

## 2020-04-30 MED ORDER — FERRIC CITRATE 1 GM 210 MG(FE) PO TABS
210.0000 mg | ORAL_TABLET | Freq: Three times a day (TID) | ORAL | Status: DC
  Administered 2020-04-30 – 2020-05-04 (×11): 210 mg via ORAL
  Filled 2020-04-30 (×13): qty 1

## 2020-04-30 MED ORDER — ACETAMINOPHEN 325 MG PO TABS
650.0000 mg | ORAL_TABLET | Freq: Four times a day (QID) | ORAL | Status: DC | PRN
  Administered 2020-05-01 – 2020-05-09 (×14): 650 mg via ORAL
  Filled 2020-04-30 (×13): qty 2

## 2020-04-30 MED ORDER — SIMVASTATIN 20 MG PO TABS
20.0000 mg | ORAL_TABLET | Freq: Every day | ORAL | Status: DC
  Administered 2020-04-30 – 2020-05-09 (×10): 20 mg via ORAL
  Filled 2020-04-30 (×10): qty 1

## 2020-04-30 MED ORDER — SODIUM CHLORIDE 0.9 % IV SOLN
2.0000 g | INTRAVENOUS | Status: AC
  Administered 2020-05-01 – 2020-05-06 (×7): 2 g via INTRAVENOUS
  Filled 2020-04-30 (×7): qty 20

## 2020-04-30 MED ORDER — AMITRIPTYLINE HCL 25 MG PO TABS: 50.0000 mg | ORAL_TABLET | Freq: Every day | ORAL | Status: DC

## 2020-04-30 MED ORDER — HEPARIN SODIUM (PORCINE) 5000 UNIT/ML IJ SOLN
5000.0000 [IU] | Freq: Three times a day (TID) | INTRAMUSCULAR | Status: DC
  Administered 2020-04-30 – 2020-05-10 (×30): 5000 [IU] via SUBCUTANEOUS
  Filled 2020-04-30 (×28): qty 1

## 2020-04-30 NOTE — Progress Notes (Signed)
[facility] Progress Note   Subjective:   Pt had HD yesterday with UF 4.5L, tolerated well. AM labs today pending, pt is a very hard stick. Reports he does not remember dialysis yesterday but feels "much much better" today. Denies SOB, CP, palpitations, dizziness.   Objective Vitals:   04/30/20 0400 04/30/20 0600 04/30/20 0800 04/30/20 1000  BP: (!) 186/94 (!) 147/86 137/83 (!) 141/87  Pulse:  75 97 78  Resp: '19 14 20 '$ (!) 22  Temp:   98.2 F (36.8 C)   TempSrc:   Oral   SpO2: 100% 99% 96% 100%  Weight:      Height:       Physical Exam General: WDWN male, alert and in NAD Heart: RRR, no murmer, no JVD Lungs: CTA bilaterally without wheezing, rhonchi or rales Abdomen: Soft, non-tender, non-distended, +BS Extremities: b/l LE wounds with erythema, trace edema b/l lower extremities Dialysis Access: AVF + bruit  Additional Objective Labs: Basic Metabolic Panel: Recent Labs  Lab 04/29/20 1612 04/29/20 1658  NA 131* 130*  K 5.6* 5.6*  CL 91*  --   CO2 22  --   GLUCOSE 105*  --   BUN 98*  --   CREATININE 16.75*  --   CALCIUM 9.6  --   PHOS 10.8*  --    Liver Function Tests: Recent Labs  Lab 04/29/20 1612  AST 7*  ALT 16  ALKPHOS 183*  BILITOT 1.3*  PROT 7.3  ALBUMIN 3.0*   Recent Labs  Lab 04/29/20 1612  LIPASE 41   CBC: Recent Labs  Lab 04/29/20 1612 04/29/20 1658  WBC 9.8  --   NEUTROABS 7.6  --   HGB 9.5* 11.6*  HCT 28.7* 34.0*  MCV 90.0  --   PLT 194  --    Blood Culture    Component Value Date/Time   SDES BLOOD RIGHT UPPER ARM 02/24/2018 1515   SPECREQUEST  02/24/2018 1515    BOTTLES DRAWN AEROBIC ONLY Blood Culture adequate volume   CULT  02/24/2018 1515    NO GROWTH 5 DAYS Performed at [facility], [place] [address] [place], [place] [address]    REPTSTATUS 03/01/2018 FINAL 02/24/2018 1515    Cardiac Enzymes: Recent Labs  Lab 04/29/20 1612  CKTOTAL 34*    Studies/Results: DG Chest 1 View  Result Date:  04/29/2020 CLINICAL DATA:  [place] 2 weeks ago, RIGHT knee pain, history CHF, pulmonary hypertension, diabetes mellitus, hypertension, MI EXAM: PORTABLE CHEST 1 VIEW COMPARISON:  Portable exam 1332 hours compared to 01/11/2020 FINDINGS: Enlargement of cardiac silhouette with vascular congestion. Mediastinal contours normal. BILATERAL pulmonary infiltrates, greatest in RIGHT upper lobe, favor pulmonary edema over infection. No pleural effusion or pneumothorax. Osseous structures unremarkable. IMPRESSION: Enlargement of cardiac silhouette with pulmonary vascular congestion and BILATERAL pulmonary infiltrates, favor pulmonary edema over infection. Electronically Signed   By: Lavonia Dana M.D.   On: 04/29/2020 16:34   DG Knee 1-2 Views Right  Result Date: 04/29/2020 CLINICAL DATA:  Fall 2 weeks prior, right knee pain and swelling EXAM: AP and lateral right knee radiographs. COMPARISON:  04/22/2020 right knee radiographs FINDINGS: Status post right total knee arthroplasty, with no hardware fracture or loosening. Small suprapatellar right knee joint effusion. No bone fracture. No dislocation. No suspicious focal osseous lesions. Vascular calcifications throughout the soft tissues. Small superior and inferior patellar enthesophytes. IMPRESSION: Small suprapatellar right knee joint effusion. No bone fracture or dislocation. Right total knee arthroplasty, with no evidence of  hardware complication. Electronically Signed   By: Ilona Sorrel M.D.   On: 04/29/2020 15:44   DG Tibia/Fibula Right  Result Date: 04/29/2020 CLINICAL DATA:  Fall 2 weeks prior, right lower extremity pain and swelling EXAM: AP and lateral views of the right tibia and fibula COMPARISON:  04/22/2020 right knee radiographs FINDINGS: Status post right total knee arthroplasty with no evidence of hardware fracture or loosening on these views. No bone fracture. No dislocation at the knee or ankle. No focal osseous lesions. No erosions or periosteal reaction.  Vascular calcifications throughout the soft tissues. Small Achilles and plantar right calcaneal spurs. Superior and inferior right patellar enthesophytes. IMPRESSION: Right total knee arthroplasty, with no evidence of hardware complication on these views. No bone fracture in the right tibia or fibula. Electronically Signed   By: Ilona Sorrel M.D.   On: 04/29/2020 15:44   VAS [place] LOWER EXTREMITY VENOUS (DVT) (ONLY [facility] & [facility] 7a-7p)  Result Date: 04/29/2020  Lower Venous DVT Study Indications: Swelling.  Risk Factors: None identified. Limitations: Body habitus, poor ultrasound/tissue interface, open wound and leg induration, patient pain tolerance. Comparison Study: No prior studies. Performing Technologist: Oliver Hum RVT  Examination Guidelines: A complete evaluation includes B-mode imaging, spectral Doppler, color Doppler, and power Doppler as needed of all accessible portions of each vessel. Bilateral testing is considered an integral part of a complete examination. Limited examinations for reoccurring indications may be performed as noted. The reflux portion of the exam is performed with the patient in reverse Trendelenburg.  +---------+---------------+---------+-----------+----------+--------------+ RIGHT    CompressibilityPhasicitySpontaneityPropertiesThrombus Aging +---------+---------------+---------+-----------+----------+--------------+ CFV      Full           Yes      Yes                                 +---------+---------------+---------+-----------+----------+--------------+ SFJ      Full                                                        +---------+---------------+---------+-----------+----------+--------------+ FV Prox  Full                                                        +---------+---------------+---------+-----------+----------+--------------+ FV Mid                  Yes      Yes                                  +---------+---------------+---------+-----------+----------+--------------+ FV Distal               Yes      Yes                                 +---------+---------------+---------+-----------+----------+--------------+ PFV      Full                                                        +---------+---------------+---------+-----------+----------+--------------+  POP      Full           Yes      Yes                                 +---------+---------------+---------+-----------+----------+--------------+ PTV      Full                                                        +---------+---------------+---------+-----------+----------+--------------+ PERO     Full                                                        +---------+---------------+---------+-----------+----------+--------------+   +----+---------------+---------+-----------+----------+--------------+ LEFTCompressibilityPhasicitySpontaneityPropertiesThrombus Aging +----+---------------+---------+-----------+----------+--------------+ CFV Full           Yes      Yes                                 +----+---------------+---------+-----------+----------+--------------+     Summary: RIGHT: - There is no evidence of deep vein thrombosis in the lower extremity. However, portions of this examination were limited- see technologist comments above.  - No cystic structure found in the popliteal fossa.  LEFT: - No evidence of common femoral vein obstruction.  *See table(s) above for measurements and observations.    Preliminary    Medications: . cefTRIAXone (ROCEPHIN)  IV     . Chlorhexidine Gluconate Cloth  6 each Topical Q0600  . cinacalcet  30 mg Oral Q supper  . ferric citrate  210 mg Oral TID WC  . gabapentin  100 mg Oral QHS  . heparin  5,000 Units Subcutaneous Q8H  . simvastatin  20 mg Oral q1800    Dialysis Orders: [facility] MWF EDW 104.5 4 hr 15 min  F180 dialyzer BFR 450 DFR A1.5 2K/ 3.5 Ca  bath AVF Heparin 5000 u bolus venofer 50 mg weekly mircera 200 mcg q 2 weeks, last given 04/04/20  Assessment/Plan:  1 Nonadherence to hemodialysis: he has EtOHism and depression. Likely needs some psychiatric help or at least resources for outpatient support.  Elavil restarted per primary team. 2 ESRD: MWF- last OP treatment 3/2 and last Rx 3/11 here prior to admission.  Had HD yesterday, 3/18. No AM lab results yet but clinically significantly improved. Will plan for next HD on Monday.  3.  Hyperkalemia: expect to improve with HD 4.  LE wounds: looks like venous stasis with cellulitis superimposed.  Antibiotics per primary.  Dopplers with no JVD 5 Hypertension: reasonably well controlled at present, 4.5L removed with HD on 3/18. Volume status improved.  6. 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

## 2020-04-30 NOTE — ED Notes (Addendum)
Patient desat into 54s on RA while sleeping.  Patient woken up, states he does have sleep apnea.  Patient placed on 2L O2 via [place].

## 2020-04-30 NOTE — ED Notes (Signed)
Patient A/A/O, remains on continuous cardiac and 02 sat monitoring, sitting up in bed eating breakfast, talkative/pleasant. No distress.

## 2020-04-30 NOTE — Progress Notes (Signed)
PROGRESS NOTE    Jamont Cocanougher  [id number] DOB: Aug 09, 1955 DOA: 04/29/2020 PCP: [name], MD    Brief Narrative:  Zyere Zuk is a 65 y.o. male with medical history significant for ESRD on MWF HD, paroxysmal atrial fibrillation not on anticoagulation, history of SDH 2018, HTN, HLD, diet-controlled T2DM, anemia of chronic kidney disease, OSA not on CPAP who presents to the ED for evaluation of leg pain and missed dialysis  3/19-no complaints  Consultants:   Nephrology  Procedures:   Antimicrobials:       Subjective: No shortness of breath or chest pain  Objective: Vitals:   04/30/20 1000 04/30/20 1100 04/30/20 1217 04/30/20 1615  BP: (!) 141/87  128/89 130/90  Pulse: 78 68 65 68  Resp: (!) 22 (!) '25 18 18  '$ Temp:  98.1 F (36.7 C) 98.1 F (36.7 C) 98 F (36.7 C)  TempSrc:  Oral Oral Oral  SpO2: 100% 98% 96% 98%  Weight:      Height:        Intake/Output Summary (Last 24 hours) at 04/30/2020 1734 Last data filed at 04/29/2020 2140 Gross per 24 hour  Intake -  Output 4500 ml  Net -4500 ml   Filed Weights   04/29/20 1203  Weight: 111.6 kg    Examination:  General exam: Appears calm and comfortable  Respiratory system: Clear to auscultation. Respiratory effort normal. Cardiovascular system: S1 & S2 heard, RRR. No JVD, murmurs, rubs, gallops or clicks. No pedal edema.intestinal system: Abdomen is nondistended, soft and nontender.  Normal bowel sounds heard. Central nervous system: Alert and oriented. No focal neurological deficits. Extremities: Mild edema Skin: No rashes,Warm dry Psych judgement and insight appear normal. Mood & affect appropriate.     Data Reviewed: I have personally reviewed following labs and imaging studies  CBC: Recent Labs  Lab 04/29/20 1612 04/29/20 1658 04/30/20 0810  WBC 9.8  --  8.2  NEUTROABS 7.6  --   --   HGB 9.5* 11.6* 10.2*  HCT 28.7* 34.0* 31.5*  MCV 90.0  --  91.6  PLT 194  --  123456   Basic Metabolic  Panel: Recent Labs  Lab 04/29/20 1612 04/29/20 1658 04/30/20 1255  NA 131* 130* 135  K 5.6* 5.6* 4.2  CL 91*  --  94*  CO2 22  --  26  GLUCOSE 105*  --  137*  BUN 98*  --  45*  CREATININE 16.75*  --  9.95*  CALCIUM 9.6  --  9.5  MG 2.2  --   --   PHOS 10.8*  --  7.5*   GFR: Estimated Creatinine Clearance: 9.1 mL/min (A) (by C-G formula based on SCr of 9.95 mg/dL (H)). Liver Function Tests: Recent Labs  Lab 04/29/20 1612 04/30/20 1255  AST 7*  --   ALT 16  --   ALKPHOS 183*  --   BILITOT 1.3*  --   PROT 7.3  --   ALBUMIN 3.0* 2.8*   Recent Labs  Lab 04/29/20 1612  LIPASE 41   No results for input(s): AMMONIA in the last 168 hours. Coagulation Profile: Recent Labs  Lab 04/29/20 1612  INR 1.3*   Cardiac Enzymes: Recent Labs  Lab 04/29/20 1612  CKTOTAL 34*   BNP (last 3 results) No results for input(s): PROBNP in the last 8760 hours. HbA1C: No results for input(s): HGBA1C in the last 72 hours. CBG: No results for input(s): GLUCAP in the last 168 hours. Lipid Profile: No results for  input(s): CHOL, HDL, LDLCALC, TRIG, CHOLHDL, LDLDIRECT in the last 72 hours. Thyroid Function Tests: No results for input(s): TSH, T4TOTAL, FREET4, T3FREE, THYROIDAB in the last 72 hours. Anemia Panel: No results for input(s): VITAMINB12, FOLATE, FERRITIN, TIBC, IRON, RETICCTPCT in the last 72 hours. Sepsis Labs: Recent Labs  Lab 04/29/20 1253  LATICACIDVEN 1.0    Recent Results (from the past 240 hour(s))  Resp Panel by RT-PCR (Flu A&B, Covid) Nasopharyngeal Swab     Status: None   Collection Time: 04/22/20 10:52 AM   Specimen: Nasopharyngeal Swab; Nasopharyngeal(NP) swabs in vial transport medium  Result Value Ref Range Status   SARS Coronavirus 2 by RT PCR NEGATIVE NEGATIVE Final    Comment: (NOTE) SARS-CoV-2 target nucleic acids are NOT DETECTED.  The SARS-CoV-2 RNA is generally detectable in upper respiratory specimens during the acute phase of infection. The  lowest concentration of SARS-CoV-2 viral copies this assay can detect is 138 copies/mL. A negative result does not preclude SARS-Cov-2 infection and should not be used as the sole basis for treatment or other patient management decisions. A negative result may occur with  improper specimen collection/handling, submission of specimen other than nasopharyngeal swab, presence of viral mutation(s) within the areas targeted by this assay, and inadequate number of viral copies(<138 copies/mL). A negative result must be combined with clinical observations, patient history, and epidemiological information. The expected result is Negative.  Fact Sheet for Patients:  EntrepreneurPulse.com.au  Fact Sheet for Healthcare Providers:  IncredibleEmployment.be  This test is no t yet approved or cleared by the [place] FDA and  has been authorized for detection and/or diagnosis of SARS-CoV-2 by FDA under an Emergency Use Authorization (EUA). This EUA will remain  in effect (meaning this test can be used) for the duration of the COVID-19 declaration under Section 564(b)(1) of the Act, 21 U.S.C.section 360bbb-3(b)(1), unless the authorization is terminated  or revoked sooner.       Influenza A by PCR NEGATIVE NEGATIVE Final   Influenza B by PCR NEGATIVE NEGATIVE Final    Comment: (NOTE) The Xpert Xpress SARS-CoV-2/FLU/RSV plus assay is intended as an aid in the diagnosis of influenza from Nasopharyngeal swab specimens and should not be used as a sole basis for treatment. Nasal washings and aspirates are unacceptable for Xpert Xpress SARS-CoV-2/FLU/RSV testing.  Fact Sheet for Patients: EntrepreneurPulse.com.au  Fact Sheet for Healthcare Providers: IncredibleEmployment.be  This test is not yet approved or cleared by the [place] FDA and has been authorized for detection and/or diagnosis of SARS-CoV-2 by FDA under  an Emergency Use Authorization (EUA). This EUA will remain in effect (meaning this test can be used) for the duration of the COVID-19 declaration under Section 564(b)(1) of the Act, 21 U.S.C. section 360bbb-3(b)(1), unless the authorization is terminated or revoked.  Performed at [facility], [place] [address], [place], [place] [address]   Resp Panel by RT-PCR (Flu A&B, Covid) Nasopharyngeal Swab     Status: None   Collection Time: 04/29/20  1:50 PM   Specimen: Nasopharyngeal Swab; Nasopharyngeal(NP) swabs in vial transport medium  Result Value Ref Range Status   SARS Coronavirus 2 by RT PCR NEGATIVE NEGATIVE Final    Comment: (NOTE) SARS-CoV-2 target nucleic acids are NOT DETECTED.  The SARS-CoV-2 RNA is generally detectable in upper respiratory specimens during the acute phase of infection. The lowest concentration of SARS-CoV-2 viral copies this assay can detect is 138 copies/mL. A negative result does not preclude SARS-Cov-2 infection and should not be  used as the sole basis for treatment or other patient management decisions. A negative result may occur with  improper specimen collection/handling, submission of specimen other than nasopharyngeal swab, presence of viral mutation(s) within the areas targeted by this assay, and inadequate number of viral copies(<138 copies/mL). A negative result must be combined with clinical observations, patient history, and epidemiological information. The expected result is Negative.  Fact Sheet for Patients:  EntrepreneurPulse.com.au  Fact Sheet for Healthcare Providers:  IncredibleEmployment.be  This test is no t yet approved or cleared by the [place] FDA and  has been authorized for detection and/or diagnosis of SARS-CoV-2 by FDA under an Emergency Use Authorization (EUA). This EUA will remain  in effect (meaning this test can be used) for the duration of the COVID-19 declaration under  Section 564(b)(1) of the Act, 21 U.S.C.section 360bbb-3(b)(1), unless the authorization is terminated  or revoked sooner.       Influenza A by PCR NEGATIVE NEGATIVE Final   Influenza B by PCR NEGATIVE NEGATIVE Final    Comment: (NOTE) The Xpert Xpress SARS-CoV-2/FLU/RSV plus assay is intended as an aid in the diagnosis of influenza from Nasopharyngeal swab specimens and should not be used as a sole basis for treatment. Nasal washings and aspirates are unacceptable for Xpert Xpress SARS-CoV-2/FLU/RSV testing.  Fact Sheet for Patients: EntrepreneurPulse.com.au  Fact Sheet for Healthcare Providers: IncredibleEmployment.be  This test is not yet approved or cleared by the [place] FDA and has been authorized for detection and/or diagnosis of SARS-CoV-2 by FDA under an Emergency Use Authorization (EUA). This EUA will remain in effect (meaning this test can be used) for the duration of the COVID-19 declaration under Section 564(b)(1) of the Act, 21 U.S.C. section 360bbb-3(b)(1), unless the authorization is terminated or revoked.  Performed at [facility], [place] [address], [place], [place] [address]   Culture, blood (routine x 2)     Status: None (Preliminary result)   Collection Time: 04/29/20  4:14 PM   Specimen: Site Not Specified; Blood  Result Value Ref Range Status   Specimen Description SITE NOT SPECIFIED  Final   Special Requests   Final    BOTTLES DRAWN AEROBIC AND ANAEROBIC Blood Culture adequate volume   Culture   Final    NO GROWTH < 24 HOURS Performed at [facility], [place] [address] [place], [place] [address]    Report Status PENDING  Incomplete  Culture, blood (routine x 2)     Status: None (Preliminary result)   Collection Time: 04/30/20  5:36 AM   Specimen: BLOOD  Result Value Ref Range Status   Specimen Description BLOOD SITE NOT SPECIFIED  Final   Special Requests   Final    AEROBIC BOTTLE ONLY Blood  Culture results may not be optimal due to an inadequate volume of blood received in culture bottles   Culture   Final    NO GROWTH < 12 HOURS Performed at [facility], [place] [address] [place],  [address]    Report Status PENDING  Incomplete         Radiology Studies: DG Chest 1 View  Result Date: 04/29/2020 CLINICAL DATA:  [place] 2 weeks ago, RIGHT knee pain, history CHF, pulmonary hypertension, diabetes mellitus, hypertension, MI EXAM: PORTABLE CHEST 1 VIEW COMPARISON:  Portable exam 1332 hours compared to 01/11/2020 FINDINGS: Enlargement of cardiac silhouette with vascular congestion. Mediastinal contours normal. BILATERAL pulmonary infiltrates, greatest in RIGHT upper lobe, favor pulmonary edema over infection.  No pleural effusion or pneumothorax. Osseous structures unremarkable. IMPRESSION: Enlargement of cardiac silhouette with pulmonary vascular congestion and BILATERAL pulmonary infiltrates, favor pulmonary edema over infection. Electronically Signed   By: Lavonia Dana M.D.   On: 04/29/2020 16:34   DG Knee 1-2 Views Right  Result Date: 04/29/2020 CLINICAL DATA:  Fall 2 weeks prior, right knee pain and swelling EXAM: AP and lateral right knee radiographs. COMPARISON:  04/22/2020 right knee radiographs FINDINGS: Status post right total knee arthroplasty, with no hardware fracture or loosening. Small suprapatellar right knee joint effusion. No bone fracture. No dislocation. No suspicious focal osseous lesions. Vascular calcifications throughout the soft tissues. Small superior and inferior patellar enthesophytes. IMPRESSION: Small suprapatellar right knee joint effusion. No bone fracture or dislocation. Right total knee arthroplasty, with no evidence of hardware complication. Electronically Signed   By: Ilona Sorrel M.D.   On: 04/29/2020 15:44   DG Tibia/Fibula Right  Result Date: 04/29/2020 CLINICAL DATA:  Fall 2 weeks prior, right lower extremity pain and swelling EXAM: AP and  lateral views of the right tibia and fibula COMPARISON:  04/22/2020 right knee radiographs FINDINGS: Status post right total knee arthroplasty with no evidence of hardware fracture or loosening on these views. No bone fracture. No dislocation at the knee or ankle. No focal osseous lesions. No erosions or periosteal reaction. Vascular calcifications throughout the soft tissues. Small Achilles and plantar right calcaneal spurs. Superior and inferior right patellar enthesophytes. IMPRESSION: Right total knee arthroplasty, with no evidence of hardware complication on these views. No bone fracture in the right tibia or fibula. Electronically Signed   By: Ilona Sorrel M.D.   On: 04/29/2020 15:44   VAS [place] LOWER EXTREMITY VENOUS (DVT) (ONLY [facility] & [facility] 7a-7p)  Result Date: 04/29/2020  Lower Venous DVT Study Indications: Swelling.  Risk Factors: None identified. Limitations: Body habitus, poor ultrasound/tissue interface, open wound and leg induration, patient pain tolerance. Comparison Study: No prior studies. Performing Technologist: Oliver Hum RVT  Examination Guidelines: A complete evaluation includes B-mode imaging, spectral Doppler, color Doppler, and power Doppler as needed of all accessible portions of each vessel. Bilateral testing is considered an integral part of a complete examination. Limited examinations for reoccurring indications may be performed as noted. The reflux portion of the exam is performed with the patient in reverse Trendelenburg.  +---------+---------------+---------+-----------+----------+--------------+ RIGHT    CompressibilityPhasicitySpontaneityPropertiesThrombus Aging +---------+---------------+---------+-----------+----------+--------------+ CFV      Full           Yes      Yes                                 +---------+---------------+---------+-----------+----------+--------------+ SFJ      Full                                                         +---------+---------------+---------+-----------+----------+--------------+ FV Prox  Full                                                        +---------+---------------+---------+-----------+----------+--------------+ FV Mid  Yes      Yes                                 +---------+---------------+---------+-----------+----------+--------------+ FV Distal               Yes      Yes                                 +---------+---------------+---------+-----------+----------+--------------+ PFV      Full                                                        +---------+---------------+---------+-----------+----------+--------------+ POP      Full           Yes      Yes                                 +---------+---------------+---------+-----------+----------+--------------+ PTV      Full                                                        +---------+---------------+---------+-----------+----------+--------------+ PERO     Full                                                        +---------+---------------+---------+-----------+----------+--------------+   +----+---------------+---------+-----------+----------+--------------+ LEFTCompressibilityPhasicitySpontaneityPropertiesThrombus Aging +----+---------------+---------+-----------+----------+--------------+ CFV Full           Yes      Yes                                 +----+---------------+---------+-----------+----------+--------------+     Summary: RIGHT: - There is no evidence of deep vein thrombosis in the lower extremity. However, portions of this examination were limited- see technologist comments above.  - No cystic structure found in the popliteal fossa.  LEFT: - No evidence of common femoral vein obstruction.  *See table(s) above for measurements and observations.    Preliminary         Scheduled Meds: . Chlorhexidine Gluconate Cloth  6 each Topical Q0600  .  cinacalcet  30 mg Oral Q supper  . ferric citrate  210 mg Oral TID WC  . gabapentin  100 mg Oral QHS  . heparin  5,000 Units Subcutaneous Q8H  . simvastatin  20 mg Oral q1800   Continuous Infusions: . cefTRIAXone (ROCEPHIN)  IV      Assessment & Plan:   Principal Problem:   ESRD (end stage renal disease) on dialysis (HCC) Active Problems:   Diabetes mellitus with neuropathy (HCC)   Hyperlipidemia   AF (paroxysmal atrial fibrillation) (HCC)   Anemia of chronic renal failure, unspecified stage   Hyperphosphatemia   Cellulitis of right lower extremity   Waylan Lybbert is a 65  y.o. male with medical history significant for ESRD on MWF HD, paroxysmal atrial fibrillation not on anticoagulation, history of SDH 2018, HTN, HLD, diet-controlled T2DM, anemia of chronic kidney disease, OSA not on CPAP, alcohol use disorder who is admitted with RLE cellulitis and volume overload due to nonadherence to dialysis.  ESRD on MWF HD: Presented with worsening peripheral edema due to nonadherence.  Mild pulmonary edema seen on CXR without hypoxia or respiratory symptoms.  3/19-HD yesterday , tolerated. Nephrology following mx per nephrology  Chronic venous stasis of lower extremities with superimposed cellulitis of RLE: Given IV vancomycin, ceftriaxone and clindamycin in the ED.  3/19-continue IV ceftriaxone   Hyperphosphatemia: Alysis  Continue  AUryxia    Paroxysmal atrial fibrillation: On admission in afib with controlled rate. Not on any a/c since prior SDH in 2018 No rate or rhythm control meds   Anemia of chronic kidney disease: Chronic and stable.  Hyperlipidemia: Continue simvastatin.  Depression: Reports depressed mood over the last 3 months leading to nonadherence.  He was previously on Elavil for neuropathy.  Restart Elavil for potential benefit.  Recommend follow-up with PCP.  Can consider addition or change to SSRI such as sertraline.  No active SI/HI.   DVT  prophylaxis: Heparin Code Status: Full Family Communication: None  Status is: Inpatient  Remains inpatient appropriate because:Inpatient level of care appropriate due to severity of illness   Dispo: The patient is from: Home              Anticipated d/c is to: Home              Patient currently is not medically stable to d/c.   Difficult to place patient No            LOS: 0 days   Time spent: 35 minutes with more than 50% on [place], MD [facility] Pager [phone]  If 7PM-7AM, please contact night-coverage 04/30/2020, 5:34 PM

## 2020-05-01 DIAGNOSIS — Z992 Dependence on renal dialysis: Secondary | ICD-10-CM | POA: Diagnosis not present

## 2020-05-01 DIAGNOSIS — N186 End stage renal disease: Secondary | ICD-10-CM | POA: Diagnosis not present

## 2020-05-01 LAB — BASIC METABOLIC PANEL
Anion gap: 12 (ref 5–15)
BUN: 26 mg/dL — ABNORMAL HIGH (ref 8–23)
CO2: 27 mmol/L (ref 22–32)
Calcium: 8.4 mg/dL — ABNORMAL LOW (ref 8.9–10.3)
Chloride: 93 mmol/L — ABNORMAL LOW (ref 98–111)
Creatinine, Ser: 6.48 mg/dL — ABNORMAL HIGH (ref 0.61–1.24)
GFR, Estimated: 9 mL/min — ABNORMAL LOW (ref >60)
Glucose, Bld: 163 mg/dL — ABNORMAL HIGH (ref 70–99)
Potassium: 3.6 mmol/L (ref 3.5–5.1)
Sodium: 132 mmol/L — ABNORMAL LOW (ref 135–145)

## 2020-05-01 MED ORDER — HEPARIN SODIUM (PORCINE) 1000 UNIT/ML DIALYSIS: 1000.0000 [IU] | INTRAMUSCULAR | Status: DC | PRN

## 2020-05-01 MED ORDER — LIDOCAINE HCL (PF) 1 % IJ SOLN: 5.0000 mL | INTRAMUSCULAR | Status: DC | PRN

## 2020-05-01 MED ORDER — PENTAFLUOROPROP-TETRAFLUOROETH EX AERO: 1.0000 "application " | INHALATION_SPRAY | CUTANEOUS | Status: DC | PRN

## 2020-05-01 MED ORDER — SODIUM CHLORIDE 0.9 % IV SOLN: 100.0000 mL | INTRAVENOUS | Status: DC | PRN

## 2020-05-01 MED ORDER — LIDOCAINE-PRILOCAINE 2.5-2.5 % EX CREA: 1.0000 "application " | TOPICAL_CREAM | CUTANEOUS | Status: DC | PRN

## 2020-05-01 MED ORDER — ALTEPLASE 2 MG IJ SOLR: 2.0000 mg | Freq: Once | INTRAMUSCULAR | Status: DC | PRN

## 2020-05-01 NOTE — Progress Notes (Signed)
PROGRESS NOTE    Daniel Whitney  [id number] DOB: 19-Nov-1955 DOA: 04/29/2020 PCP: [name], MD    Brief Narrative:  Daniel Whitney is a 65 y.o. male with medical history significant for ESRD on MWF HD, paroxysmal atrial fibrillation not on anticoagulation, history of SDH 2018, HTN, HLD, diet-controlled T2DM, anemia of chronic kidney disease, OSA not on CPAP who presents to the ED for evaluation of leg pain and missed dialysis  3/19-no complaints 3/20-feels much better today. Plan for HD on Monday   Consultants:   Nephrology  Procedures:   Antimicrobials:       Subjective: Reports he is unable to take care of himself needs to go to SNF.  Objective: Vitals:   04/30/20 2152 04/30/20 2237 05/01/20 0014 05/01/20 0907  BP: 122/64 (!) 153/77 (!) 142/78 123/75  Pulse: 75 73 70 76  Resp: 17 (!) '23 18 18  '$ Temp:  98.4 F (36.9 C) 98.9 F (37.2 C) 98.4 F (36.9 C)  TempSrc:  Oral Oral Oral  SpO2: 94% 99% 100% 96%  Weight:      Height:        Intake/Output Summary (Last 24 hours) at 05/01/2020 1447 Last data filed at 05/01/2020 [id number] Gross per 24 hour  Intake 700 ml  Output 4500 ml  Net -3800 ml   Filed Weights   04/29/20 1203  Weight: 111.6 kg    Examination: Sitting up in bed, NAD CTA no wheezes rhonchi's Regular S1-S2 no gallops Soft benign positive bowel sounds Decreased lower extremity edema Awake and alert and oriented Mood and affect appropriate in current setting   Data Reviewed: I have personally reviewed following labs and imaging studies  CBC: Recent Labs  Lab 04/29/20 1612 04/29/20 1658 04/30/20 0810  WBC 9.8  --  8.2  NEUTROABS 7.6  --   --   HGB 9.5* 11.6* 10.2*  HCT 28.7* 34.0* 31.5*  MCV 90.0  --  91.6  PLT 194  --  123456   Basic Metabolic Panel: Recent Labs  Lab 04/29/20 1612 04/29/20 1658 04/30/20 1255 05/01/20 0847  NA 131* 130* 135 132*  K 5.6* 5.6* 4.2 3.6  CL 91*  --  94* 93*  CO2 22  --  26 27  GLUCOSE 105*  --  137*  163*  BUN 98*  --  45* 26*  CREATININE 16.75*  --  9.95* 6.48*  CALCIUM 9.6  --  9.5 8.4*  MG 2.2  --   --   --   PHOS 10.8*  --  7.5*  --    GFR: Estimated Creatinine Clearance: 14 mL/min (A) (by C-G formula based on SCr of 6.48 mg/dL (H)). Liver Function Tests: Recent Labs  Lab 04/29/20 1612 04/30/20 1255  AST 7*  --   ALT 16  --   ALKPHOS 183*  --   BILITOT 1.3*  --   PROT 7.3  --   ALBUMIN 3.0* 2.8*   Recent Labs  Lab 04/29/20 1612  LIPASE 41   No results for input(s): AMMONIA in the last 168 hours. Coagulation Profile: Recent Labs  Lab 04/29/20 1612  INR 1.3*   Cardiac Enzymes: Recent Labs  Lab 04/29/20 1612  CKTOTAL 34*   BNP (last 3 results) No results for input(s): PROBNP in the last 8760 hours. HbA1C: No results for input(s): HGBA1C in the last 72 hours. CBG: No results for input(s): GLUCAP in the last 168 hours. Lipid Profile: No results for input(s): CHOL, HDL, LDLCALC, TRIG, CHOLHDL,  LDLDIRECT in the last 72 hours. Thyroid Function Tests: No results for input(s): TSH, T4TOTAL, FREET4, T3FREE, THYROIDAB in the last 72 hours. Anemia Panel: No results for input(s): VITAMINB12, FOLATE, FERRITIN, TIBC, IRON, RETICCTPCT in the last 72 hours. Sepsis Labs: Recent Labs  Lab 04/29/20 1253  LATICACIDVEN 1.0    Recent Results (from the past 240 hour(s))  Resp Panel by RT-PCR (Flu A&B, Covid) Nasopharyngeal Swab     Status: None   Collection Time: 04/22/20 10:52 AM   Specimen: Nasopharyngeal Swab; Nasopharyngeal(NP) swabs in vial transport medium  Result Value Ref Range Status   SARS Coronavirus 2 by RT PCR NEGATIVE NEGATIVE Final    Comment: (NOTE) SARS-CoV-2 target nucleic acids are NOT DETECTED.  The SARS-CoV-2 RNA is generally detectable in upper respiratory specimens during the acute phase of infection. The lowest concentration of SARS-CoV-2 viral copies this assay can detect is 138 copies/mL. A negative result does not preclude  SARS-Cov-2 infection and should not be used as the sole basis for treatment or other patient management decisions. A negative result may occur with  improper specimen collection/handling, submission of specimen other than nasopharyngeal swab, presence of viral mutation(s) within the areas targeted by this assay, and inadequate number of viral copies(<138 copies/mL). A negative result must be combined with clinical observations, patient history, and epidemiological information. The expected result is Negative.  Fact Sheet for Patients:  EntrepreneurPulse.com.au  Fact Sheet for Healthcare Providers:  IncredibleEmployment.be  This test is no t yet approved or cleared by the [place] FDA and  has been authorized for detection and/or diagnosis of SARS-CoV-2 by FDA under an Emergency Use Authorization (EUA). This EUA will remain  in effect (meaning this test can be used) for the duration of the COVID-19 declaration under Section 564(b)(1) of the Act, 21 U.S.C.section 360bbb-3(b)(1), unless the authorization is terminated  or revoked sooner.       Influenza A by PCR NEGATIVE NEGATIVE Final   Influenza B by PCR NEGATIVE NEGATIVE Final    Comment: (NOTE) The Xpert Xpress SARS-CoV-2/FLU/RSV plus assay is intended as an aid in the diagnosis of influenza from Nasopharyngeal swab specimens and should not be used as a sole basis for treatment. Nasal washings and aspirates are unacceptable for Xpert Xpress SARS-CoV-2/FLU/RSV testing.  Fact Sheet for Patients: EntrepreneurPulse.com.au  Fact Sheet for Healthcare Providers: IncredibleEmployment.be  This test is not yet approved or cleared by the [place] FDA and has been authorized for detection and/or diagnosis of SARS-CoV-2 by FDA under an Emergency Use Authorization (EUA). This EUA will remain in effect (meaning this test can be used) for the duration of  the COVID-19 declaration under Section 564(b)(1) of the Act, 21 U.S.C. section 360bbb-3(b)(1), unless the authorization is terminated or revoked.  Performed at [facility], [place] [address] [place], [place] [address]   Resp Panel by RT-PCR (Flu A&B, Covid) Nasopharyngeal Swab     Status: None   Collection Time: 04/29/20  1:50 PM   Specimen: Nasopharyngeal Swab; Nasopharyngeal(NP) swabs in vial transport medium  Result Value Ref Range Status   SARS Coronavirus 2 by RT PCR NEGATIVE NEGATIVE Final    Comment: (NOTE) SARS-CoV-2 target nucleic acids are NOT DETECTED.  The SARS-CoV-2 RNA is generally detectable in upper respiratory specimens during the acute phase of infection. The lowest concentration of SARS-CoV-2 viral copies this assay can detect is 138 copies/mL. A negative result does not preclude SARS-Cov-2 infection and should not be used as the sole basis for  treatment or other patient management decisions. A negative result may occur with  improper specimen collection/handling, submission of specimen other than nasopharyngeal swab, presence of viral mutation(s) within the areas targeted by this assay, and inadequate number of viral copies(<138 copies/mL). A negative result must be combined with clinical observations, patient history, and epidemiological information. The expected result is Negative.  Fact Sheet for Patients:  EntrepreneurPulse.com.au  Fact Sheet for Healthcare Providers:  IncredibleEmployment.be  This test is no t yet approved or cleared by the [place] FDA and  has been authorized for detection and/or diagnosis of SARS-CoV-2 by FDA under an Emergency Use Authorization (EUA). This EUA will remain  in effect (meaning this test can be used) for the duration of the COVID-19 declaration under Section 564(b)(1) of the Act, 21 U.S.C.section 360bbb-3(b)(1), unless the authorization is terminated  or revoked sooner.        Influenza A by PCR NEGATIVE NEGATIVE Final   Influenza B by PCR NEGATIVE NEGATIVE Final    Comment: (NOTE) The Xpert Xpress SARS-CoV-2/FLU/RSV plus assay is intended as an aid in the diagnosis of influenza from Nasopharyngeal swab specimens and should not be used as a sole basis for treatment. Nasal washings and aspirates are unacceptable for Xpert Xpress SARS-CoV-2/FLU/RSV testing.  Fact Sheet for Patients: EntrepreneurPulse.com.au  Fact Sheet for Healthcare Providers: IncredibleEmployment.be  This test is not yet approved or cleared by the [place] FDA and has been authorized for detection and/or diagnosis of SARS-CoV-2 by FDA under an Emergency Use Authorization (EUA). This EUA will remain in effect (meaning this test can be used) for the duration of the COVID-19 declaration under Section 564(b)(1) of the Act, 21 U.S.C. section 360bbb-3(b)(1), unless the authorization is terminated or revoked.  Performed at [facility], [place] [address], [place], [place] [address]   Culture, blood (routine x 2)     Status: None (Preliminary result)   Collection Time: 04/29/20  4:14 PM   Specimen: Site Not Specified; Blood  Result Value Ref Range Status   Specimen Description SITE NOT SPECIFIED  Final   Special Requests   Final    BOTTLES DRAWN AEROBIC AND ANAEROBIC Blood Culture adequate volume   Culture   Final    NO GROWTH < 24 HOURS Performed at [facility], [place] [address] [place], [place] [address]    Report Status PENDING  Incomplete  Culture, blood (routine x 2)     Status: None (Preliminary result)   Collection Time: 04/30/20  5:36 AM   Specimen: BLOOD  Result Value Ref Range Status   Specimen Description BLOOD SITE NOT SPECIFIED  Final   Special Requests   Final    AEROBIC BOTTLE ONLY Blood Culture results may not be optimal due to an inadequate volume of blood received in culture bottles   Culture   Final    NO  GROWTH < 12 HOURS Performed at [facility], [place] [address] [place], [place] [address]    Report Status PENDING  Incomplete         Radiology Studies: VAS [place] LOWER EXTREMITY VENOUS (DVT) (ONLY MC & [facility] 7a-7p)  Result Date: 05/01/2020  Lower Venous DVT Study Indications: Swelling.  Risk Factors: None identified. Limitations: Body habitus, poor ultrasound/tissue interface, open wound and leg induration, patient pain tolerance. Comparison Study: No prior studies. Performing Technologist: Oliver Hum RVT  Examination Guidelines: A complete evaluation includes B-mode imaging, spectral Doppler, color Doppler, and power Doppler as needed of  all accessible portions of each vessel. Bilateral testing is considered an integral part of a complete examination. Limited examinations for reoccurring indications may be performed as noted. The reflux portion of the exam is performed with the patient in reverse Trendelenburg.  +---------+---------------+---------+-----------+----------+--------------+ RIGHT    CompressibilityPhasicitySpontaneityPropertiesThrombus Aging +---------+---------------+---------+-----------+----------+--------------+ CFV      Full           Yes      Yes                                 +---------+---------------+---------+-----------+----------+--------------+ SFJ      Full                                                        +---------+---------------+---------+-----------+----------+--------------+ FV Prox  Full                                                        +---------+---------------+---------+-----------+----------+--------------+ FV Mid                  Yes      Yes                                 +---------+---------------+---------+-----------+----------+--------------+ FV Distal               Yes      Yes                                 +---------+---------------+---------+-----------+----------+--------------+ PFV      Full                                                         +---------+---------------+---------+-----------+----------+--------------+ POP      Full           Yes      Yes                                 +---------+---------------+---------+-----------+----------+--------------+ PTV      Full                                                        +---------+---------------+---------+-----------+----------+--------------+ PERO     Full                                                        +---------+---------------+---------+-----------+----------+--------------+   +----+---------------+---------+-----------+----------+--------------+ LEFTCompressibilityPhasicitySpontaneityPropertiesThrombus Aging +----+---------------+---------+-----------+----------+--------------+ CFV Full  Yes      Yes                                 +----+---------------+---------+-----------+----------+--------------+     Summary: RIGHT: - There is no evidence of deep vein thrombosis in the lower extremity. However, portions of this examination were limited- see technologist comments above.  - No cystic structure found in the popliteal fossa.  LEFT: - No evidence of common femoral vein obstruction.  *See table(s) above for measurements and observations. Electronically signed by Ruta Hinds MD on 05/01/2020 at 11:20:50 AM.    Final         Scheduled Meds: . Chlorhexidine Gluconate Cloth  6 each Topical Q0600  . cinacalcet  30 mg Oral Q supper  . ferric citrate  210 mg Oral TID WC  . gabapentin  100 mg Oral QHS  . heparin  5,000 Units Subcutaneous Q8H  . simvastatin  20 mg Oral q1800   Continuous Infusions: . sodium chloride    . sodium chloride    . cefTRIAXone (ROCEPHIN)  IV 2 g (05/01/20 0022)    Assessment & Plan:   Principal Problem:   ESRD (end stage renal disease) on dialysis ([place]) Active Problems:   Diabetes mellitus with neuropathy (HCC)   Hyperlipidemia   AF  (paroxysmal atrial fibrillation) (HCC)   Anemia of chronic renal failure, unspecified stage   Hyperphosphatemia   Cellulitis of right lower extremity   Daniel Whitney is a 65 y.o. male with medical history significant for ESRD on MWF HD, paroxysmal atrial fibrillation not on anticoagulation, history of SDH 2018, HTN, HLD, diet-controlled T2DM, anemia of chronic kidney disease, OSA not on CPAP, alcohol use disorder who is admitted with RLE cellulitis and volume overload due to nonadherence to dialysis.  ESRD on MWF HD: Presented with worsening peripheral edema due to nonadherence.  Mild pulmonary edema seen on CXR without hypoxia or respiratory symptoms.  3/19-HD yesterday , tolerated. Nephrology following 3/20-plan for hemodialysis on Monday  Chronic venous stasis of lower extremities with superimposed cellulitis of RLE: Given IV vancomycin, ceftriaxone and clindamycin in the ED.  3/20-continue IV ceftriaxone, improving exam   Hyperphosphatemia: Continue Auryxia  Anemia of chronic kidney disease: Chronic , stable   Paroxysmal atrial fibrillation: On admission in afib with controlled rate. Not on any a/c since prior SDH in 2018 No rate or rhythm control meds   Hyperkalemia-present on admission K was 5.6>>>3.6 Improved with dialysis   Hyperlipidemia: Continue simvastatin.  Depression: Reports depressed mood over the last 3 months leading to nonadherence.  He was previously on Elavil for neuropathy.  Restart Elavil for potential benefit.  Recommend follow-up with PCP.  Can consider addition or change to SSRI such as sertraline.  No active SI/HI.   DVT prophylaxis: Heparin Code Status: Full Family Communication: None  Status is: Inpatient  Remains inpatient appropriate because:Inpatient level of care appropriate due to severity of illness   Dispo: The patient is from: Home              Anticipated d/c is to: Home              Patient currently is not medically  stable to d/c.   Difficult to place patient No            LOS: 1 day   Time spent: 45 minutes with more than 50% on [place],  MD [facility] Pager [phone]  If 7PM-7AM, please contact night-coverage 05/01/2020, 2:47 PM

## 2020-05-01 NOTE — Progress Notes (Signed)
[facility] Progress Note   Subjective:     Patient seen and examined at bedside today. He is sitting in chair eating lunch. He reports feeling much better. Denies SOB, CP, ABD pain, and N/V/D. Plan is for scheduled HD treatment on 05/03/20.  Objective Vitals:   04/30/20 2152 04/30/20 2237 05/01/20 0014 05/01/20 0907  BP: 122/64 (!) 153/77 (!) 142/78 123/75  Pulse: 75 73 70 76  Resp: 17 (!) '23 18 18  '$ Temp:  98.4 F (36.9 C) 98.9 F (37.2 C) 98.4 F (36.9 C)  TempSrc:  Oral Oral Oral  SpO2: 94% 99% 100% 96%  Weight:      Height:       Physical Exam General: Appears comfortable; No acute respiratory distress Heart: Normal S1 and S2; No murmurs, gallops, or friction rub Lungs: Clear throughout; No wheezing, rales, or rhonchi Abdomen: Large, soft, non-tender, active bowel sounds Extremities: Skin discolored, dry, and with open wounds bilateral lower extremities, no edema noted Dialysis Access: L AVF (+) Bruit/Thrill  Filed Weights   04/29/20 1203  Weight: 111.6 kg    Intake/Output Summary (Last 24 hours) at 05/01/2020 1354 Last data filed at 05/01/2020 0925 Gross per 24 hour  Intake 700 ml  Output 4500 ml  Net -3800 ml    Additional Objective Labs: Basic Metabolic Panel: Recent Labs  Lab 04/29/20 1612 04/29/20 1658 04/30/20 1255 05/01/20 0847  NA 131* 130* 135 132*  K 5.6* 5.6* 4.2 3.6  CL 91*  --  94* 93*  CO2 22  --  26 27  GLUCOSE 105*  --  137* 163*  BUN 98*  --  45* 26*  CREATININE 16.75*  --  9.95* 6.48*  CALCIUM 9.6  --  9.5 8.4*  PHOS 10.8*  --  7.5*  --    Liver Function Tests: Recent Labs  Lab 04/29/20 1612 04/30/20 1255  AST 7*  --   ALT 16  --   ALKPHOS 183*  --   BILITOT 1.3*  --   PROT 7.3  --   ALBUMIN 3.0* 2.8*   Recent Labs  Lab 04/29/20 1612  LIPASE 41   CBC: Recent Labs  Lab 04/29/20 1612 04/29/20 1658 04/30/20 0810  WBC 9.8  --  8.2  NEUTROABS 7.6  --   --   HGB 9.5* 11.6* 10.2*  HCT 28.7* 34.0*  31.5*  MCV 90.0  --  91.6  PLT 194  --  196   Blood Culture    Component Value Date/Time   SDES BLOOD SITE NOT SPECIFIED 04/30/2020 0536   SPECREQUEST  04/30/2020 0536    AEROBIC BOTTLE ONLY Blood Culture results may not be optimal due to an inadequate volume of blood received in culture bottles   CULT  04/30/2020 0536    NO GROWTH < 12 HOURS Performed at [facility] [address] [place], [place] [address]    REPTSTATUS PENDING 04/30/2020 0536    Cardiac Enzymes: Recent Labs  Lab 04/29/20 1612  CKTOTAL 34*   CBG: No results for input(s): GLUCAP in the last 168 hours. Iron Studies: No results for input(s): IRON, TIBC, TRANSFERRIN, FERRITIN in the last 72 hours. Lab Results  Component Value Date   INR 1.3 (H) 04/29/2020   INR 1.1 02/15/2019   INR 1.00 02/26/2018   PROTIME 27.0 (A) 07/26/2016   PROTIME 26.8 (A) 07/20/2016   PROTIME 23.7 (A) 07/13/2016   Studies/Results: VAS [place] LOWER EXTREMITY VENOUS (DVT) (ONLY MC &  [facility] 7a-7p)  Result Date: 05/01/2020  Lower Venous DVT Study Indications: Swelling.  Risk Factors: None identified. Limitations: Body habitus, poor ultrasound/tissue interface, open wound and leg induration, patient pain tolerance. Comparison Study: No prior studies. Performing Technologist: Oliver Hum RVT  Examination Guidelines: A complete evaluation includes B-mode imaging, spectral Doppler, color Doppler, and power Doppler as needed of all accessible portions of each vessel. Bilateral testing is considered an integral part of a complete examination. Limited examinations for reoccurring indications may be performed as noted. The reflux portion of the exam is performed with the patient in reverse Trendelenburg.  +---------+---------------+---------+-----------+----------+--------------+ RIGHT    CompressibilityPhasicitySpontaneityPropertiesThrombus Aging +---------+---------------+---------+-----------+----------+--------------+ CFV      Full            Yes      Yes                                 +---------+---------------+---------+-----------+----------+--------------+ SFJ      Full                                                        +---------+---------------+---------+-----------+----------+--------------+ FV Prox  Full                                                        +---------+---------------+---------+-----------+----------+--------------+ FV Mid                  Yes      Yes                                 +---------+---------------+---------+-----------+----------+--------------+ FV Distal               Yes      Yes                                 +---------+---------------+---------+-----------+----------+--------------+ PFV      Full                                                        +---------+---------------+---------+-----------+----------+--------------+ POP      Full           Yes      Yes                                 +---------+---------------+---------+-----------+----------+--------------+ PTV      Full                                                        +---------+---------------+---------+-----------+----------+--------------+ PERO     Full                                                        +---------+---------------+---------+-----------+----------+--------------+   +----+---------------+---------+-----------+----------+--------------+  LEFTCompressibilityPhasicitySpontaneityPropertiesThrombus Aging +----+---------------+---------+-----------+----------+--------------+ CFV Full           Yes      Yes                                 +----+---------------+---------+-----------+----------+--------------+     Summary: RIGHT: - There is no evidence of deep vein thrombosis in the lower extremity. However, portions of this examination were limited- see technologist comments above.  - No cystic structure found in the popliteal fossa.  LEFT: - No  evidence of common femoral vein obstruction.  *See table(s) above for measurements and observations. Electronically signed by Ruta Hinds MD on 05/01/2020 at 11:20:50 AM.    Final     Medications: . cefTRIAXone (ROCEPHIN)  IV 2 g (05/01/20 0022)   . Chlorhexidine Gluconate Cloth  6 each Topical Q0600  . cinacalcet  30 mg Oral Q supper  . ferric citrate  210 mg Oral TID WC  . gabapentin  100 mg Oral QHS  . heparin  5,000 Units Subcutaneous Q8H  . simvastatin  20 mg Oral q1800    Dialysis Orders: [facility] MWF EDW 104.5 4 hr 15 min  F180 dialyzer BFR 450 DFR A1.5 2K/ 3.5 Ca bath AVF Heparin 5000 u bolus venofer 50 mg weekly mircera 200 mcg q 2 weeks, last given 04/04/20  Assessment/Plan: 1Nonadherence to hemodialysis: he has EtOHism and depression. Likely needs some psychiatric help or at least resources for outpatient support. Elavil restarted per primary team. 2 ESRD:MWF- last OP treatment 3/2 and last Rx 3/11 here prior to admission. Had HD yesterday, 3/18. K+ now at 3.6. Will plan for next HD on Monday 05/02/20 3. Hyperkalemia: Resolved: K+ now 3.6. Will continue to monitor K+ level trends. 4. LE wounds: looks like venous stasis with cellulitis superimposed. Antibiotics per primary. Dopplers with no JVD 5Hypertension: Blood pressures and volume status improving.  6. Anemia of ESRD:Hgb 10.2; ESA last given [id number] 7Metabolic Bone Disease: Auryxia as binder, no VDRA 8, Dispo: to be determined once medically stable  Daniel Poet, NP [place] 05/01/2020,1:54 PM  LOS: 1 day

## 2020-05-02 DIAGNOSIS — N186 End stage renal disease: Secondary | ICD-10-CM | POA: Diagnosis not present

## 2020-05-02 DIAGNOSIS — Z992 Dependence on renal dialysis: Secondary | ICD-10-CM | POA: Diagnosis not present

## 2020-05-02 LAB — GLUCOSE, CAPILLARY: Glucose-Capillary: 115 mg/dL — ABNORMAL HIGH (ref 70–99)

## 2020-05-02 NOTE — Progress Notes (Signed)
Subjective: On hemodialysis no complaints states he will be better compliant outpatient with HD treatments  Objective Vital signs in last 24 hours: Vitals:   05/02/20 1200 05/02/20 1231 05/02/20 1254 05/02/20 1358  BP: (!) 147/112 (!) 153/102 (!) 148/81 (!) 153/86  Pulse: 66 66 68 83  Resp: '18 18 18 18  '$ Temp:   98.1 F (36.7 C) 97.9 F (36.6 C)  TempSrc:   Oral Oral  SpO2:   99% 98%  Weight:   100.7 kg   Height:       Weight change:   Physical Exam: General: Alert obese male NAD Heart: RRR, no MRG Lungs: CTA, nonlabored breathing Abdomen: Obese bowel sounds normal active, NTND soft Extremities: Bilateral lower extremity edema trace with erythema from shin down, dry flaky skin Access: Left arm AV fistula patent on hemodialysis  Dialysis Orders: [facility] MWF EDW 104.5 4 hr 15 min  F180 dialyzer BFR 450 DFR A1.5 2K/ 3.5 Ca bath AVF Heparin 5000 u bolus venofer 50 mg weekly mircera 200 mcg q 2 weeks, last given 04/04/20  Problem/Plan: 1volume overload with nonadherence to hemodialysis: Had consecutive 4.5 L UF HD treatments,  has EtOHism and depression.Likelyneeds some psychiatric help or at least resources for outpatient support. Elavil restarted per primary team. 2 ESRD:MWF- last OP treatment 3/2 and last Rx 3/11 hereprior to admission. HD today on schedule  3. Hyperkalemia: Resolved: K+ now 3.6. monitor K+ . 4. LE wounds: Consistent with cellulitis / venous stasis . Antibiotics per primary. Dopplers with no JVD 5Hypertension: Blood pressures and volume status improving. 6. Anemia of ESRD:Hgb 10.2; ESA last given [id number] 7Metabolic Bone Disease: Auryxia as binder, no VDRA 8, Dispo: to be determined once medically stable  Ernest Haber, PA-C [place] [phone] 05/02/2020,3:10 PM  LOS: 2 days   Labs: Basic Metabolic Panel: Recent Labs  Lab 04/29/20 1612 04/29/20 1658 04/30/20 1255 05/01/20 0847  NA 131* 130* 135 132*  K 5.6*  5.6* 4.2 3.6  CL 91*  --  94* 93*  CO2 22  --  26 27  GLUCOSE 105*  --  137* 163*  BUN 98*  --  45* 26*  CREATININE 16.75*  --  9.95* 6.48*  CALCIUM 9.6  --  9.5 8.4*  PHOS 10.8*  --  7.5*  --    Liver Function Tests: Recent Labs  Lab 04/29/20 1612 04/30/20 1255  AST 7*  --   ALT 16  --   ALKPHOS 183*  --   BILITOT 1.3*  --   PROT 7.3  --   ALBUMIN 3.0* 2.8*   Recent Labs  Lab 04/29/20 1612  LIPASE 41   No results for input(s): AMMONIA in the last 168 hours. CBC: Recent Labs  Lab 04/29/20 1612 04/29/20 1658 04/30/20 0810  WBC 9.8  --  8.2  NEUTROABS 7.6  --   --   HGB 9.5* 11.6* 10.2*  HCT 28.7* 34.0* 31.5*  MCV 90.0  --  91.6  PLT 194  --  196   Cardiac Enzymes: Recent Labs  Lab 04/29/20 1612  CKTOTAL 34*   CBG: Recent Labs  Lab 05/02/20 0726  GLUCAP 115*    Studies/Results: No results found. Medications: . cefTRIAXone (ROCEPHIN)  IV 2 g (05/01/20 2148)   . Chlorhexidine Gluconate Cloth  6 each Topical Q0600  . cinacalcet  30 mg Oral Q supper  . ferric citrate  210 mg Oral TID WC  . gabapentin  100 mg Oral  QHS  . heparin  5,000 Units Subcutaneous Q8H  . simvastatin  20 mg Oral q1800

## 2020-05-02 NOTE — Progress Notes (Signed)
PT Cancellation Note  Patient Details Name: Ellwood Eade MRN: [id number] DOB: Dec 15, 1955   Cancelled Treatment:    Reason Eval/Treat Not Completed: Patient at procedure or test/unavailable currently at HD- will attempt to return if time/schedule allow.    Windell Norfolk, DPT, PN1   Supplemental Physical Therapist [facility]    Pager [phone] Acute Rehab Office [phone]

## 2020-05-02 NOTE — Progress Notes (Incomplete)
Pharmacy Rounding Learning Note - not an active part of the chart  PMH: ESRD, paroxysmal Afib, subdural hematoma in 2018, T2DM (controlled with diet), HTN   CC/HPI: Pt arrived to the ED on 3/18 for evaluation of leg pain and missed dialysis sessions. Pt reported missing dialysis for the past 6 days upon admission and had been experiencing bilateral leg pain for 2 weeks.    Drug Allergies: tobacco, perfume & cologne     Problem 1: Peripheral edema/cellulitis of right lower extremity  - pt was given IV vancomycin, ceftriaxone, and clindamycin  - pt currently on ceftriaxone IV q24h (day 7 of antibiotics) - MD noted improving exam  - lower extremity edema has decreased with dialysis   Problem 2: Afib/subdural hematoma - pt not currently on any rate or rhythm medications - pt currently on heparin subQ  -    Problem 3: HTN/HLD - pt BP elevated  - pt currently on amlodipine  - pt currently on simvastatin inpatient    Problem 4: T2DM - pt controls with diet - pt BG levels < 180 since admission    Renal:  Cause of AKI: N/A, ESRD  SCr: N/A  Dialysis:   Type: hemodialysis    Schedule: MWF (pt last outpatient treatment was 3/2)   Access type: AV fistula left forearm   Days:  3/18: 400 mL/min, 4 hours 3/19: 400 mL/min, 3.5 hours 3/21: 400 mL/min, 4 hours 3/23: was supposed to get this day 3/24: 400 mL/min, 4 hours 3/25:   Tolerating well   Urine output: N/A  Electrolytes: 3/24  Sodium: 132 (low)  Potassium: 3.8  Calcium: 8.3 (low), CC 9.1  Magnesium: 2.2 (3/18)  Phosphorus: 5.3 (down from 7.5 on 3/19)  Hgb: 9.3 (3/24) Ferritin: N/A Iron saturation: N/A  Renal meds:  - cinacalcet (Sensipar)  - ferric citrate Lorin Picket)    Plan:  - last day of ceftriaxone (3/24)  - MD note 3/23 states switching to oral antibiotics for 2 more days - continue to monitor leg for signs of improvement  - pt reported skipping dialysis sessions d/t depression and may need  medication treatment, patient was restarted on amitriptyline  - pt also wanted something to calm his nerves and was started on buspirone   - BP stable today  - pt on amlodipine  - glucose levels still < 180 - pt not currently on any diabetes medications   - occupational therapy recommends SNF for short term rehab to maximize independence and safety with daily tasks - still waiting on SNF placement   - pt is tolerating dialysis well - continue providing dialysis while patient is still admitted  - dialysis today 3/25 (short session) in order to get back on schedule   - pt to start Aranesp today (100 mcg IV Q Friday HD) - continue to monitor electrolytes and hemoglobin

## 2020-05-02 NOTE — Progress Notes (Signed)
OT Cancellation Note  Patient Details Name: Daniel Whitney MRN: [id number] DOB: Mar 27, 1955   Cancelled Treatment:    Reason Eval/Treat Not Completed: Other (comment) Per RN, dialysis on the way to take pt off of unit. Request to hold OT eval due to this. Will follow-up for OT eval as appropriate.  Layla Maw 05/02/2020, 7:17 AM

## 2020-05-02 NOTE — Progress Notes (Signed)
Renal Navigator appreciates update from patient's HD clinic Social Worker who states he has missed multiple sessions lately and that his brother, Walden Field has contacted the clinic recently with concern for his brother, Daniel Whitney. Richardson Landry reported that his brother has fallen recently and is unable to get up by himself. He also mentioned to clinic staff that there is "feces all over the home."  Navigator notes that PT/OT was not able to evaluate patient today due to HD, but will follow up with rehab dept tomorrow. Navigator will discuss options with TOC.  Alphonzo Cruise, [place] Renal Navigator [phone]

## 2020-05-02 NOTE — Progress Notes (Signed)
PROGRESS NOTE    Daniel Whitney  [id number] DOB: 1955/03/22 DOA: 04/29/2020 PCP: [name], MD    Brief Narrative:  Daniel Whitney is a 65 y.o. male with medical history significant for ESRD on MWF HD, paroxysmal atrial fibrillation not on anticoagulation, history of SDH 2018, HTN, HLD, diet-controlled T2DM, anemia of chronic kidney disease, OSA not on CPAP who presents to the ED for evaluation of leg pain and missed dialysis  3/19-no complaints 3/20-feels much better today. Plan for HD on Monday 3/21-In HD today, tolerating. NO complaints  Consultants:   Nephrology  Procedures:   Antimicrobials:       Subjective: No new complaints. Denies sob, cp, dizziness  Objective: Vitals:   05/02/20 1254 05/02/20 1358 05/02/20 1720 05/02/20 1951  BP: (!) 148/81 (!) 153/86 (!) 144/80 130/68  Pulse: 68 83 82 75  Resp: '18 18 18 16  '$ Temp: 98.1 F (36.7 C) 97.9 F (36.6 C) 98 F (36.7 C) 98.7 F (37.1 C)  TempSrc: Oral Oral Oral Oral  SpO2: 99% 98% 98% 97%  Weight: 100.7 kg     Height:        Intake/Output Summary (Last 24 hours) at 05/02/2020 2103 Last data filed at 05/02/2020 1802 Gross per 24 hour  Intake 1037.62 ml  Output 1000 ml  Net 37.62 ml   Filed Weights   04/29/20 1203 05/02/20 0852 05/02/20 1254  Weight: 111.6 kg 102.3 kg 100.7 kg    Examination: Cad, in HD cta no w/r/r rrr s1/s2 Soft benign, +bs Chronic skin changes, no edema aaxox3 , grossly intact  Data Reviewed: I have personally reviewed following labs and imaging studies  CBC: Recent Labs  Lab 04/29/20 1612 04/29/20 1658 04/30/20 0810  WBC 9.8  --  8.2  NEUTROABS 7.6  --   --   HGB 9.5* 11.6* 10.2*  HCT 28.7* 34.0* 31.5*  MCV 90.0  --  91.6  PLT 194  --  [id number]   Basic Metabolic Panel: Recent Labs  Lab 04/29/20 1612 04/29/20 1658 04/30/20 1255 05/01/20 0847  NA 131* 130* 135 132*  K 5.6* 5.6* 4.2 3.6  CL 91*  --  94* 93*  CO2 22  --  26 27  GLUCOSE 105*  --  137* 163*  BUN  98*  --  45* 26*  CREATININE 16.75*  --  9.95* 6.48*  CALCIUM 9.6  --  9.5 8.4*  MG 2.2  --   --   --   PHOS 10.8*  --  7.5*  --    GFR: Estimated Creatinine Clearance: 13.3 mL/min (A) (by C-G formula based on SCr of 6.48 mg/dL (H)). Liver Function Tests: Recent Labs  Lab 04/29/20 1612 04/30/20 1255  AST 7*  --   ALT 16  --   ALKPHOS 183*  --   BILITOT 1.3*  --   PROT 7.3  --   ALBUMIN 3.0* 2.8*   Recent Labs  Lab 04/29/20 1612  LIPASE 41   No results for input(s): AMMONIA in the last 168 hours. Coagulation Profile: Recent Labs  Lab 04/29/20 1612  INR 1.3*   Cardiac Enzymes: Recent Labs  Lab 04/29/20 1612  CKTOTAL 34*   BNP (last 3 results) No results for input(s): PROBNP in the last 8760 hours. HbA1C: No results for input(s): HGBA1C in the last 72 hours. CBG: Recent Labs  Lab 05/02/20 0726  GLUCAP 115*   Lipid Profile: No results for input(s): CHOL, HDL, LDLCALC, TRIG, CHOLHDL, LDLDIRECT in the last  72 hours. Thyroid Function Tests: No results for input(s): TSH, T4TOTAL, FREET4, T3FREE, THYROIDAB in the last 72 hours. Anemia Panel: No results for input(s): VITAMINB12, FOLATE, FERRITIN, TIBC, IRON, RETICCTPCT in the last 72 hours. Sepsis Labs: Recent Labs  Lab 04/29/20 1253  LATICACIDVEN 1.0    Recent Results (from the past 240 hour(s))  Resp Panel by RT-PCR (Flu A&B, Covid) Nasopharyngeal Swab     Status: None   Collection Time: 04/29/20  1:50 PM   Specimen: Nasopharyngeal Swab; Nasopharyngeal(NP) swabs in vial transport medium  Result Value Ref Range Status   SARS Coronavirus 2 by RT PCR NEGATIVE NEGATIVE Final    Comment: (NOTE) SARS-CoV-2 target nucleic acids are NOT DETECTED.  The SARS-CoV-2 RNA is generally detectable in upper respiratory specimens during the acute phase of infection. The lowest concentration of SARS-CoV-2 viral copies this assay can detect is 138 copies/mL. A negative result does not preclude SARS-Cov-2 infection and  should not be used as the sole basis for treatment or other patient management decisions. A negative result may occur with  improper specimen collection/handling, submission of specimen other than nasopharyngeal swab, presence of viral mutation(s) within the areas targeted by this assay, and inadequate number of viral copies(<138 copies/mL). A negative result must be combined with clinical observations, patient history, and epidemiological information. The expected result is Negative.  Fact Sheet for Patients:  EntrepreneurPulse.com.au  Fact Sheet for Healthcare Providers:  IncredibleEmployment.be  This test is no t yet approved or cleared by the [place] FDA and  has been authorized for detection and/or diagnosis of SARS-CoV-2 by FDA under an Emergency Use Authorization (EUA). This EUA will remain  in effect (meaning this test can be used) for the duration of the COVID-19 declaration under Section 564(b)(1) of the Act, 21 U.S.C.section 360bbb-3(b)(1), unless the authorization is terminated  or revoked sooner.       Influenza A by PCR NEGATIVE NEGATIVE Final   Influenza B by PCR NEGATIVE NEGATIVE Final    Comment: (NOTE) The Xpert Xpress SARS-CoV-2/FLU/RSV plus assay is intended as an aid in the diagnosis of influenza from Nasopharyngeal swab specimens and should not be used as a sole basis for treatment. Nasal washings and aspirates are unacceptable for Xpert Xpress SARS-CoV-2/FLU/RSV testing.  Fact Sheet for Patients: EntrepreneurPulse.com.au  Fact Sheet for Healthcare Providers: IncredibleEmployment.be  This test is not yet approved or cleared by the [place] FDA and has been authorized for detection and/or diagnosis of SARS-CoV-2 by FDA under an Emergency Use Authorization (EUA). This EUA will remain in effect (meaning this test can be used) for the duration of the COVID-19 declaration  under Section 564(b)(1) of the Act, 21 U.S.C. section 360bbb-3(b)(1), unless the authorization is terminated or revoked.  Performed at [facility], Center [address] [place], [place] [address]   Culture, blood (routine x 2)     Status: None (Preliminary result)   Collection Time: 04/29/20  4:14 PM   Specimen: Site Not Specified; Blood  Result Value Ref Range Status   Specimen Description SITE NOT SPECIFIED  Final   Special Requests   Final    BOTTLES DRAWN AEROBIC AND ANAEROBIC Blood Culture adequate volume   Culture   Final    NO GROWTH 3 DAYS Performed at [facility], [place] [address], [place], [place] [address]    Report Status PENDING  Incomplete  Culture, blood (routine x 2)     Status: None (Preliminary result)   Collection Time: 04/30/20  5:36 AM   Specimen: BLOOD  Result Value Ref Range Status   Specimen Description BLOOD SITE NOT SPECIFIED  Final   Special Requests   Final    AEROBIC BOTTLE ONLY Blood Culture results may not be optimal due to an inadequate volume of blood received in culture bottles   Culture   Final    NO GROWTH 2 DAYS Performed at [facility], [place] [address] [place], [place] [address]    Report Status PENDING  Incomplete         Radiology Studies: No results found.      Scheduled Meds: . Chlorhexidine Gluconate Cloth  6 each Topical Q0600  . cinacalcet  30 mg Oral Q supper  . ferric citrate  210 mg Oral TID WC  . gabapentin  100 mg Oral QHS  . heparin  5,000 Units Subcutaneous Q8H  . simvastatin  20 mg Oral q1800   Continuous Infusions: . cefTRIAXone (ROCEPHIN)  IV 2 g (05/01/20 2148)    Assessment & Plan:   Principal Problem:   ESRD (end stage renal disease) on dialysis [facility]) Active Problems:   Diabetes mellitus with neuropathy (HCC)   Hyperlipidemia   AF (paroxysmal atrial fibrillation) (HCC)   Anemia of chronic renal failure, unspecified stage   Hyperphosphatemia   Cellulitis of right lower  extremity   Daniel Whitney is a 65 y.o. male with medical history significant for ESRD on MWF HD, paroxysmal atrial fibrillation not on anticoagulation, history of SDH 2018, HTN, HLD, diet-controlled T2DM, anemia of chronic kidney disease, OSA not on CPAP, alcohol use disorder who is admitted with RLE cellulitis and volume overload due to nonadherence to dialysis.  ESRD on MWF HD: Presented with worsening peripheral edema due to nonadherence.  Mild pulmonary edema seen on CXR without hypoxia or respiratory symptoms.  3/19-HD yesterday , tolerated. Nephrology following 3/21-HD today  Chronic venous stasis of lower extremities with superimposed cellulitis of RLE: Given IV vancomycin, ceftriaxone and clindamycin in the ED.  3/21-improving Continue iv ceftriaxone    Hyperphosphatemia: Continue Auryxia  Anemia of chronic kidney disease: Stable, chronic   Paroxysmal atrial fibrillation: On admission in afib with controlled rate. Not on any a/c since prior SDH in 2018 No rate or rhythm control meds   Hyperkalemia-present on admission Improved with dialysis   Hyperlipidemia: Continue simvastatin.  Depression: Reports depressed mood over the last 3 months leading to nonadherence.  He was previously on Elavil for neuropathy.  Restart Elavil for potential benefit.  Recommend follow-up with PCP.  Can consider addition or change to SSRI such as sertraline.  No active SI/HI.  PT/OT Pending  DVT prophylaxis: Heparin Code Status: Full Family Communication: None  Status is: Inpatient  Remains inpatient appropriate because:Inpatient level of care appropriate due to severity of illness   Dispo: The patient is from: Home              Anticipated d/c is to: TBD, PT OT needs to evaluate              Patient currently is not medically stable to d/c.   Difficult to place patient No            LOS: 2 days   Time spent: 35 minutes with more than 50% on [place], MD [facility] Pager [phone]  If 7PM-7AM, please contact night-coverage 05/02/2020, 9:03 PM

## 2020-05-03 ENCOUNTER — Other Ambulatory Visit: Payer: Self-pay | Admitting: *Deleted

## 2020-05-03 DIAGNOSIS — Z992 Dependence on renal dialysis: Secondary | ICD-10-CM | POA: Diagnosis not present

## 2020-05-03 DIAGNOSIS — N186 End stage renal disease: Secondary | ICD-10-CM | POA: Diagnosis not present

## 2020-05-03 LAB — BASIC METABOLIC PANEL WITH GFR
Anion gap: 10 (ref 5–15)
BUN: 24 mg/dL — ABNORMAL HIGH (ref 8–23)
CO2: 29 mmol/L (ref 22–32)
Calcium: 8.5 mg/dL — ABNORMAL LOW (ref 8.9–10.3)
Chloride: 94 mmol/L — ABNORMAL LOW (ref 98–111)
Creatinine, Ser: 6.37 mg/dL — ABNORMAL HIGH (ref 0.61–1.24)
GFR, Estimated: 9 mL/min — ABNORMAL LOW
Glucose, Bld: 109 mg/dL — ABNORMAL HIGH (ref 70–99)
Potassium: 3.6 mmol/L (ref 3.5–5.1)
Sodium: 133 mmol/L — ABNORMAL LOW (ref 135–145)

## 2020-05-03 MED ORDER — AMLODIPINE BESYLATE 5 MG PO TABS
5.0000 mg | ORAL_TABLET | Freq: Every day | ORAL | Status: DC
  Administered 2020-05-04 – 2020-05-09 (×6): 5 mg via ORAL
  Filled 2020-05-03 (×6): qty 1

## 2020-05-03 MED ORDER — AMLODIPINE BESYLATE 5 MG PO TABS
5.0000 mg | ORAL_TABLET | Freq: Every day | ORAL | Status: DC
  Administered 2020-05-03: 5 mg via ORAL
  Filled 2020-05-03: qty 1

## 2020-05-03 MED ORDER — AMITRIPTYLINE HCL 50 MG PO TABS
50.0000 mg | ORAL_TABLET | Freq: Every day | ORAL | Status: DC
  Administered 2020-05-03 – 2020-05-09 (×7): 50 mg via ORAL
  Filled 2020-05-03 (×7): qty 1

## 2020-05-03 NOTE — NC FL2 (Signed)
[place] MEDICAID FL2 LEVEL OF CARE SCREENING TOOL     IDENTIFICATION  Patient Name: Daniel Whitney Birthdate: 02-11-1956 Sex: male Admission Date (Current Location): 04/29/2020  [facility] and [place] Number:  Herbalist and Address:  [facility] [place], [place] [address], [place], [place] [address]      Provider Number: [id number]  Attending Physician Name and Address:  Nolberto Hanlon, MD  Relative Name and Phone Number:  Ovie Sitzes - brother - [phone]    Current Level of Care: Hospital Recommended Level of Care: [place] Prior Approval Number:    Date Approved/Denied:   PASRR Number: [id number]  Discharge Plan: SNF    Current Diagnoses: Patient Active Problem List   Diagnosis Date Noted  . Hyperphosphatemia 04/29/2020  . Cellulitis of right lower extremity 04/29/2020  . Noncompliance of patient with renal dialysis ([place]) 01/11/2020  . Prolonged QT interval 02/15/2019  . Shock ([place]) 02/24/2018  . Hyperkalemia, diminished renal excretion 01/01/2018  . Syncope and collapse 04/03/2017  . History of CVA (cerebrovascular accident) 04/03/2017  . CKD (chronic kidney disease) stage V requiring chronic dialysis ([place]) 04/03/2017  . Hypothyroid 04/03/2017  . Refusal of blood transfusions as patient is Jehovah's Witness 04/03/2017  . Prediabetes-HgbA1c 6.2 04/03/2017  . History of subdural hematoma Oct 2018 04/03/2017  . 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 Chronic atrial fibrillation 04/03/2017  . Subdural hematoma ([place]) 11/29/2016  . Peripheral neuropathy 07/26/2016  . At risk for adverse drug reaction 07/03/2016  . Primary osteoarthritis of left knee 06/26/2016  . Postoperative anemia due to acute blood loss 01/27/2016  . S/P knee replacement 01/24/2016  . Low back pain 02/09/2015  . Bilateral knee pain 01/27/2015  . Atypical atrial flutter ([place]) 01/24/2015  . Acute on chronic combined  systolic and diastolic heart failure ([place]) 01/05/2015  . Hyperkalemia 11/08/2014  . Hypocalcemia 11/08/2014  . Recurrent falls 10/13/2014  . Venous stasis ulcer ([place]) 10/13/2014  . Hypoxia   . Leukocytosis   . Respiratory failure ([place])   . ARDS (adult respiratory distress syndrome) ([place])   . Encounter for central line placement   . Cardiopulmonary arrest ([place])   . Compression fracture of L3 lumbar vertebra 08/04/2014  . Anemia of chronic renal failure, unspecified stage 07/07/2014  . Allergic rhinitis 07/07/2014  . Anemia of chronic disease 07/07/2014  . Right rotator cuff tear 04/27/2014  . Acute gouty arthritis 11/11/2013  . Hearing loss in right ear 11/11/2013  . Chronic pain syndrome 11/11/2013  . Right shoulder pain 11/11/2013  . Facial rash 11/01/2013  . Right otitis externa 10/27/2013  . Pulmonary HTN- PA 58 mmHg 04/16/13 09/17/2013  . Right hip pain 08/04/2013  . DOE (dyspnea on exertion) 06/18/2013  . Allergic rhinitis, cause unspecified 06/18/2013  . Carpal tunnel syndrome 06/06/2013  . Primary localized osteoarthrosis, lower leg 06/04/2013  . Preoperative cardiovascular examination 03/10/2013  . Preventative health care 12/30/2012  . AF (paroxysmal atrial fibrillation) ([place]) 12/30/2012  . CHF (congestive heart failure) ([place]) 12/30/2012  . Diabetic retinopathy ([place]) 12/30/2012  . Morbid obesity ([place]) 12/30/2012  . OSA (obstructive sleep apnea)- on C-pap 12/30/2012  . Bilateral hearing loss 12/30/2012  . Arthritis   . Glaucoma   . Diabetes mellitus with neuropathy ([place])   . Hypertension   . ESRD (end stage renal disease) on dialysis ([place])   . Stroke ([place])   . Hyperlipidemia     Orientation RESPIRATION BLADDER Height &  Weight     Self,Time,Situation,Place  Normal Continent Weight: 222 lb 0.1 oz (100.7 kg) Height:  '5\' 9"'$  (175.3 cm)  BEHAVIORAL SYMPTOMS/MOOD NEUROLOGICAL BOWEL NUTRITION STATUS      Continent Diet (Renal with 1200 mL fluid restriction)   AMBULATORY STATUS COMMUNICATION OF NEEDS Skin   Total Care (Patient was unable to ambulate with PT) Verbally Other (Comment),Skin abrasions (Abrasion left leg; Venous stasis ulcer ppretibial right, left; Cellulitis right leg)                       Personal Care Assistance Level of Assistance  Bathing,Feeding,Dressing Bathing Assistance: Maximum assistance (Upper body assistance with set-up) Feeding assistance: Independent Dressing Assistance: Maximum assistance (Upper body assistance with set-up)     Functional Limitations Info  Sight,Hearing,Speech Sight Info: Adequate - Wears glasses Hearing Info: Adequate Speech Info: Adequate    SPECIAL CARE FACTORS FREQUENCY  PT (By licensed PT),OT (By licensed OT)     PT Frequency: Evaluated 3/22. PT at SNF eval and treat, a minimum of 5 days per week OT Frequency: Evaluated 3/22. OT at SNF eva and treat, a minimum of 5 days per week            Contractures Contractures Info: Not present    Additional Factors Info  Code Status,Allergies Code Status Info: Full Allergies Info: Tobacco           Current Medications (05/03/2020):  This is the current hospital active medication list Current Facility-Administered Medications  Medication Dose Route Frequency Provider Last Rate Last Admin  . acetaminophen (TYLENOL) tablet 650 mg  650 mg Oral Q6H PRN Lenore Cordia, MD   650 mg at 05/03/20 1328   Or  . acetaminophen (TYLENOL) suppository 650 mg  650 mg Rectal Q6H PRN Lenore Cordia, MD      . amitriptyline (ELAVIL) tablet 50 mg  50 mg Oral QHS Nolberto Hanlon, MD      . Derrill Memo ON 05/04/2020] amLODipine (NORVASC) tablet 5 mg  5 mg Oral QHS Zeyfang, David, PA-C      . cefTRIAXone (ROCEPHIN) 2 g in sodium chloride 0.9 % 100 mL IVPB  2 g Intravenous Q24H Nolberto Hanlon, MD 200 mL/hr at 05/02/20 2131 2 g at 05/02/20 2131  . Chlorhexidine Gluconate Cloth 2 % PADS 6 each  6 each Topical Q0600 Adelfa Koh, NP   6 each at 05/01/20 [phone]   . cinacalcet (SENSIPAR) tablet 30 mg  30 mg Oral Q supper Lenore Cordia, MD   30 mg at 05/02/20 1806  . ferric citrate (AURYXIA) tablet 210 mg  210 mg Oral TID WC Zada Finders R, MD   210 mg at 05/03/20 1328  . gabapentin (NEURONTIN) capsule 100 mg  100 mg Oral QHS Zada Finders R, MD   100 mg at 05/02/20 2129  . heparin injection 5,000 Units  5,000 Units Subcutaneous Q8H Lenore Cordia, MD   5,000 Units at 05/03/20 1500  . simvastatin (ZOCOR) tablet 20 mg  20 mg Oral q1800 Lenore Cordia, MD   20 mg at 05/02/20 1806     Discharge Medications: Please see discharge summary for a list of discharge medications.  Relevant Imaging Results:  Relevant Lab Results:   Additional Information SS# [id number]. HD patient - dialysis [place] Time 12:30 pm.  Needs to arrive at 12:15 pm and be picked up at 5:20 pm  Patient has had the pfizer vaccines: 08/27/19  and 10/20/19. Hs not had the booster  Sharlet [place], Mila Homer, LCSW

## 2020-05-03 NOTE — Plan of Care (Signed)
  Problem: Health Behavior/Discharge Planning: Goal: Ability to manage health-related needs will improve Outcome: Progressing   Problem: Nutritional: Goal: Ability to make healthy dietary choices will improve Outcome: Progressing   Problem: Clinical Measurements: Goal: Complications related to the disease process, condition or treatment will be avoided or minimized Outcome: Progressing

## 2020-05-03 NOTE — Plan of Care (Signed)
  Problem: Fluid Volume: Goal: Compliance with measures to maintain balanced fluid volume will improve Outcome: Progressing   Problem: Nutritional: Goal: Ability to make healthy dietary choices will improve Outcome: Progressing   Problem: Education: Goal: Knowledge of General Education information will improve Description: Including pain rating scale, medication(s)/side effects and non-pharmacologic comfort measures Outcome: Progressing   Problem: Activity: Goal: Risk for activity intolerance will decrease Outcome: Progressing   Problem: Nutrition: Goal: Adequate nutrition will be maintained Outcome: Progressing   Problem: Pain Managment: Goal: General experience of comfort will improve Outcome: Progressing

## 2020-05-03 NOTE — Consult Note (Signed)
   [facility] Inpatient Consult   05/03/2020  Mcarther Malley 1955/12/06 [id number]   [place] [facility] [ACO] Patient: [employer]  High Risk Score: for unplanned readmission risk  Patient is currently active with [place] Management for chronic disease management services.  Patient has had several outreach attempts noted by a [place] Management Coordinator and [facility] Social Worker has been recently referred for housing conditions of patient who is living alone and high fall risk, and minimal community support.    Electronic Medical Record reveals from PT/OT recommendations for a [place] for post hospital transition.  HD changes noted as well for post hospital facility.  Plan: Follow for disposition patient is currently being recommended by PT/OT for a SNF level of care for transition.  Will notify Inpatient Transition Of Care [TOC] team member to make aware that [place] Management has attempted follow up in community.   Of note, [facility] Care Management services does not replace or interfere with any services that are needed or arranged by inpatient [facility] care management team.  For additional questions or referrals please contact:  Natividad Brood, RN BSN [facility] Liaison  [phone] business mobile phone Toll free office [phone]  Fax number: [phone] [email or web address]

## 2020-05-03 NOTE — Evaluation (Signed)
Physical Therapy Evaluation Patient Details Name: Daniel Whitney MRN: [id number] DOB: August 28, 1955 Today's Date: 05/03/2020   History of Present Illness  Daniel Whitney is a 65 y.o. male admitted on 04/29/20 with ESRD after missing several days of HD and complaints of leg pain. PMH includes paroxysmal atrial fibrillation not on anticoagulation, history of SDH 2018, HTN, HLD, diet-controlled T2DM, anemia of chronic kidney disease, and OSA not on CPAP.    Clinical Impression  Pt received in chair, cooperative and pleasant. Per NT, pt did well with lateral/scoot transfer to chair. Pt needing mod A assistance to get into standing. Pt had difficulty remaining steady in standing long enough to move hand from chair to RW and needed several attempts to do so. Upon standing, unable to get R foot fully on floor likely due to R knee flexion contracture. Education provided on positioning to address contracture and repositioned R LE to promote knee extension and neutral hip rotation. Pt unsafe to d/c home alone. Pt would benefit from PT to address strength, balance, and decrease risk for falls. Recommend SNF. Pt agreeable, left in chair with all needs met and call bell within reach. Will continue to follow acutely.    Follow Up Recommendations SNF;Supervision for mobility/OOB    Equipment Recommendations  3in1 (PT)    Recommendations for Other Services       Precautions / Restrictions Precautions Precautions: Fall Restrictions Weight Bearing Restrictions: No      Mobility  Bed Mobility               General bed mobility comments: Pt received in recliner    Transfers Overall transfer level: Needs assistance Equipment used: Rolling walker (2 wheeled) Transfers: Sit to/from Stand Sit to Stand: Mod assist         General transfer comment: Mod A to power up into standing from recliner. No cueing needs for hand placement or to scoot forward. Difficulty WB through R knee and getting R foot  completely on floor. Once in standing, needed several attempts to steady himself enough to bring hand from chair to RW  Ambulation/Gait             General Gait Details: unable  Stairs            Wheelchair Mobility    Modified Rankin (Stroke Patients Only)       Balance Overall balance assessment: Needs assistance Sitting-balance support: No upper extremity supported;Feet supported Sitting balance-Leahy Scale: Good     Standing balance support: Bilateral upper extremity supported;During functional activity Standing balance-Leahy Scale: Poor Standing balance comment: reliant on B UE support and external assistance                             Pertinent Vitals/Pain Pain Assessment: Faces Faces Pain Scale: Hurts even more Pain Location: R knee when weightbearing Pain Descriptors / Indicators: Guarding;Grimacing Pain Intervention(s): Monitored during session    Home Living Family/patient expects to be discharged to:: Private residence Living Arrangements: Alone Available Help at Discharge: Family;Available PRN/intermittently Type of Home: House Home Access: Stairs to enter   Entrance Stairs-Number of Steps: 5 + 3 Home Layout: Two level;1/2 bath on main level;Other (Comment) (bedroom and full bath upstairs) Home Equipment: Crutches;Walker - 4 wheels;Bedside commode;Wheelchair - manual;Tub bench;Walker - 2 wheels;Adaptive equipment      Prior Function Level of Independence: Needs assistance   Gait / Transfers Assistance Needed: reports frequent falls, difficulty stepping without legs  giving out. uses wheelchair outside, sometimes inside as well as Rollator inside (though broken now). Uses crutches to get up steps inside the home - has wheelchair right at door inside. reports that he has been using wheelchair in house more frequently and hasn't been walking too much  ADL's / Homemaking Assistance Needed: Reports increasing difficulty with ADLs. mostly  has been sponge bathing in half bath downstairs. Son in law comes by occasionally to assist up stairs and for showering task (uses tub bench for transfers). Reports forgetful in taking medication        Hand Dominance   Dominant Hand: Right    Extremity/Trunk Assessment   Upper Extremity Assessment Upper Extremity Assessment: Defer to OT evaluation    Lower Extremity Assessment Lower Extremity Assessment: Generalized weakness    Cervical / Trunk Assessment Cervical / Trunk Assessment: Normal  Communication   Communication: No difficulties  Cognition Arousal/Alertness: Awake/alert Behavior During Therapy: WFL for tasks assessed/performed Overall Cognitive Status: Within Functional Limits for tasks assessed Area of Impairment: Memory                     Memory: Decreased short-term memory         General Comments: pt endorses memory difficulties and forgetting to take medication sometimes. aware of deficits and demonstrating need for changes in lifestyle and possible need to move to more accessible living environment      General Comments General comments (skin integrity, edema, etc.): Noted R knee flexion contracture. Unable to get R knee into full extension passively. Pt reports knee has been gradually getting worse since fall a few weeks ago.    Exercises     Assessment/Plan    PT Assessment Patient needs continued PT services  PT Problem List Decreased strength;Decreased mobility;Decreased safety awareness;Decreased range of motion;Decreased activity tolerance;Decreased balance;Decreased knowledge of use of DME;Decreased cognition       PT Treatment Interventions DME instruction;Therapeutic exercise;Gait training;Wheelchair mobility training;Balance training;Stair training;Neuromuscular re-education;Functional mobility training;Therapeutic activities;Patient/family education;Cognitive remediation    PT Goals (Current goals can be found in the Care Plan  section)  Acute Rehab PT Goals Patient Stated Goal: be able to walk daughter down the aisle in May PT Goal Formulation: With patient Time For Goal Achievement: 05/17/20 Potential to Achieve Goals: Good    Frequency Min 3X/week   Barriers to discharge        Co-evaluation               AM-PAC PT "6 Clicks" Mobility  Outcome Measure Help needed turning from your back to your side while in a flat bed without using bedrails?: A Little Help needed moving from lying on your back to sitting on the side of a flat bed without using bedrails?: A Little Help needed moving to and from a bed to a chair (including a wheelchair)?: A Lot Help needed standing up from a chair using your arms (e.g., wheelchair or bedside chair)?: A Lot Help needed to walk in hospital room?: A Lot Help needed climbing 3-5 steps with a railing? : A Lot 6 Click Score: 14    End of Session Equipment Utilized During Treatment: Gait belt Activity Tolerance: Patient limited by pain (knee pain) Patient left: in chair;with call bell/phone within reach Nurse Communication: Mobility status PT Visit Diagnosis: Unsteadiness on feet (R26.81);Muscle weakness (generalized) (M62.81)    Time: 2841-3244 PT Time Calculation (min) (ACUTE ONLY): 20 min   Charges:   PT Evaluation $PT Eval Moderate Complexity:  [place], SPT

## 2020-05-03 NOTE — Plan of Care (Signed)
  Problem: Health Behavior/Discharge Planning: Goal: Ability to manage health-related needs will improve Outcome: Progressing   Problem: Clinical Measurements: Goal: Ability to maintain clinical measurements within normal limits will improve Outcome: Progressing   Problem: Nutrition: Goal: Adequate nutrition will be maintained Outcome: Progressing   

## 2020-05-03 NOTE — Evaluation (Signed)
Occupational Therapy Evaluation Patient Details Name: Daniel Whitney MRN: [id number] DOB: 08-Nov-1955 Today's Date: 05/03/2020    History of Present Illness Daniel Whitney is a 65 y.o. male with medical history significant for ESRD on MWF HD, paroxysmal atrial fibrillation not on anticoagulation, history of SDH 2018, HTN, HLD, diet-controlled T2DM, anemia of chronic kidney disease, OSA not on CPAP who presents to the ED for evaluation of leg pain and missed dialysis   Clinical Impression   PTA, pt lives alone and reports increasing difficulty managing ADLs and mobility at home. Pt endorses frequent falls due to legs giving out (R knee contracture and increased pain with WB noted today). Pt presents now, pleasant and motivated to improve functional abilities. Pt reports using sliding board this AM with NT to get to chair. Pt able to demo standing with RW at [place] though unable to maintain due to knee pain. Pt overall Setup for UB ADLs and up to Max A due to deficits noted below. Began education on compensatory strategies or use of AE for LB ADLs to increase independence. Also provided PROM stretching to R LE to encourage knee flexion and repositioned in chair to encourage proper joint alignment. Recommend SNF for short term rehab to maximize independence and safety with daily tasks with pt agreeable to rehab placement.    Follow Up Recommendations  SNF    Equipment Recommendations  3 in 1 bedside commode    Recommendations for Other Services       Precautions / Restrictions Precautions Precautions: Fall Restrictions Weight Bearing Restrictions: No      Mobility Bed Mobility               General bed mobility comments: in recliner on entry    Transfers Overall transfer level: Needs assistance Equipment used: Rolling walker (2 wheeled) Transfers: Sit to/from Stand Sit to Stand: Min assist         General transfer comment: Min A for sit to stand from recliner, cues for  positioning to scoot forward, pushes from armrests accurately. Pt able to stand < 10 seconds before increased pain with WB through R knee and request to sit down.    Balance Overall balance assessment: Needs assistance Sitting-balance support: No upper extremity supported;Feet supported Sitting balance-Leahy Scale: Good     Standing balance support: Bilateral upper extremity supported;During functional activity Standing balance-Leahy Scale: Poor Standing balance comment: reliant on B UE support                           ADL either performed or assessed with clinical judgement   ADL Overall ADL's : Needs assistance/impaired Eating/Feeding: Independent;Sitting   Grooming: Set up;Sitting   Upper Body Bathing: Set up;Sitting   Lower Body Bathing: Maximal assistance;Sit to/from stand   Upper Body Dressing : Set up;Sitting   Lower Body Dressing: Maximal assistance;Sit to/from stand Lower Body Dressing Details (indicate cue type and reason): Difficulty reaching feet to pull up socks, assistance needed.     Toileting- Clothing Manipulation and Hygiene: Maximal assistance;Sit to/from stand         General ADL Comments: Limited by decreased ROM/strength and pain in R knee, decreased endurance and difficulty reaching B feet for LB ADLs. Encouraged use of pill box for med mgmt (reports his is broken) and educated on compensatory strategies/use of AE for LB ADLs     Vision Baseline Vision/History: Wears glasses Wears Glasses: Reading only Patient Visual Report: No change  from baseline Vision Assessment?: No apparent visual deficits     Perception     Praxis      Pertinent Vitals/Pain Pain Assessment: Faces Faces Pain Scale: Hurts even more Pain Location: R knee when weightbearing Pain Descriptors / Indicators: Guarding;Grimacing Pain Intervention(s): Monitored during session;Limited activity within patient's tolerance;Repositioned     Hand Dominance Right    Extremity/Trunk Assessment Upper Extremity Assessment Upper Extremity Assessment: Overall WFL for tasks assessed   Lower Extremity Assessment Lower Extremity Assessment: Defer to PT evaluation   Cervical / Trunk Assessment Cervical / Trunk Assessment: Normal   Communication Communication Communication: No difficulties   Cognition Arousal/Alertness: Awake/alert Behavior During Therapy: WFL for tasks assessed/performed Overall Cognitive Status: Impaired/Different from baseline Area of Impairment: Memory                     Memory: Decreased short-term memory         General Comments: pt endorses memory difficulties and forgetting to take medication sometimes. aware of deficits and demonstrating need for changes in lifestyle and possible need to move to more accessible living environment   General Comments  NT assisted pt to drop arm recliner this AM via sliding board and reports pt did well with this. Provided PROM stretching to R knee to increase extension. Educated on ROM/stretching to get knee facing up to ceiling and decrease external rotation of hip. Placed pillow and rolled blankets to encourage improved joint alignment while in chair    Exercises     Shoulder Instructions      Home Living Family/patient expects to be discharged to:: Private residence Living Arrangements: Alone Available Help at Discharge: Family;Available PRN/intermittently Type of Home: House Home Access: Stairs to enter Entrance Stairs-Number of Steps: 5 + 3   Home Layout: Two level;1/2 bath on main level;Other (Comment) (bedroom and full bath upstairs) Alternate Level Stairs-Number of Steps: flight   Bathroom Shower/Tub: Teacher, early years/pre: Standard     Home Equipment: Crutches;Walker - 4 wheels;Bedside commode;Wheelchair - manual;Tub bench;Walker - 2 wheels;Adaptive equipment Adaptive Equipment: Reacher        Prior Functioning/Environment Level of Independence:  Needs assistance  Gait / Transfers Assistance Needed: reports frequent falls, difficulty stepping without legs giving out. uses wheelchair outside, sometimes inside as well as Rollator inside (though broken now). Uses crutches to get up steps inside the home - has wheelchair right at door inside ADL's / Homemaking Assistance Needed: Reports increasing difficulty with ADLs. mostly has been spongfe bathing in half bath downstairs. Son in law comes by occasionally to assist up stairs and for showering task (uses tub bench for transfers). Reports forgetful in taking medication            OT Problem List: Decreased strength;Decreased range of motion;Decreased activity tolerance;Impaired balance (sitting and/or standing);Decreased knowledge of use of DME or AE;Pain      OT Treatment/Interventions: Self-care/ADL training;Therapeutic exercise;DME and/or AE instruction;Therapeutic activities;Balance training;Patient/family education    OT Goals(Current goals can be found in the care plan section) Acute Rehab OT Goals Patient Stated Goal: be able to walk daughter down the aisle in May OT Goal Formulation: With patient Time For Goal Achievement: 05/17/20 Potential to Achieve Goals: Good ADL Goals Pt Will Perform Grooming: with set-up;standing Pt Will Perform Lower Body Dressing: with min assist;sitting/lateral leans;sit to/from stand;with adaptive equipment Pt Will Transfer to Toilet: with min assist;ambulating Pt Will Perform Toileting - Clothing Manipulation and hygiene: with min assist;sitting/lateral leans;sit to/from stand  Pt/caregiver will Perform Home Exercise Program: Increased strength;Both right and left upper extremity;With theraband;Independently;With written HEP provided  OT Frequency: Min 2X/week   Barriers to D/C:            Co-evaluation              AM-PAC OT "6 Clicks" Daily Activity     Outcome Measure Help from another person eating meals?: None Help from another  person taking care of personal grooming?: A Little Help from another person toileting, which includes using toliet, bedpan, or urinal?: A Lot Help from another person bathing (including washing, rinsing, drying)?: A Lot Help from another person to put on and taking off regular upper body clothing?: A Little Help from another person to put on and taking off regular lower body clothing?: A Lot 6 Click Score: 16   End of Session Equipment Utilized During Treatment: Gait belt;Rolling walker Nurse Communication: Mobility status;Other (comment) (R knee)  Activity Tolerance: Patient tolerated treatment well Patient left: in chair;with call bell/phone within reach  OT Visit Diagnosis: Unsteadiness on feet (R26.81);Other abnormalities of gait and mobility (R26.89);Muscle weakness (generalized) (M62.81);Pain Pain - Right/Left: Right Pain - part of body: Knee                Time: [id number] OT Time Calculation (min): 37 min Charges:  OT General Charges $OT Visit: 1 Visit OT Evaluation $OT Eval Moderate Complexity: 1 Mod OT Treatments $Self Care/Home Management : 8-22 mins  Malachy Chamber, OTR/L Acute Rehab Services Office: [phone]  Layla Maw 05/03/2020, 10:11 AM

## 2020-05-03 NOTE — Patient Outreach (Signed)
[facility]) Care Management  05/03/2020  Daniel Whitney Nov 17, 1955 [id number]   CSW received a new referral on patient from Enzo Montgomery, Telephonic RNCM with [place] Management, on 04/29/2020, indicating that patient is agreeable to receiving assistance with skilled nursing placement.  Shortly thereafter (within a matter of hours), patient presented to the Emergency Department, via [facility], complaining of leg pain, due to 4 recent falls.  While hospitalized, patient has been evaluated by physical therapy and occupational therapy, both recommending short-term skilled nursing placement for rehabilitative services.  After thorough review of patient's Electronic Medical Record in Epic, it appears that Crawford Givens is the Licensed Clinical Social Worker assigned to his case, and will be assisting with skilled nursing placement.  Ms. Sharlet Salina documented that she has already completed the FL-2 Form on patient and has faxed him out to various skilled nursing facilities to try and pursue bed offers.  Ms. Sharlet Salina will continue to assist patient with placement arrangements.  CSW will perform a case closure on patient, as no additional social work needs have been identified at this time.  Nat Christen, BSW, MSW, LCSW  Licensed Education officer, [facility]  Mailing [place] [address], [place], [place] [address] Physical Address-[address], [place], [place] [address] Toll Free Main # [phone] Fax # [phone] Cell # [phone]  [email or web address]

## 2020-05-03 NOTE — Progress Notes (Addendum)
Subjective: Sitting in bedside chair, no complaints ,said tolerated dialysis yesterday, noted for SNF placement  Objective Vital signs in last 24 hours: Vitals:   05/02/20 1720 05/02/20 1951 05/03/20 0427 05/03/20 1005  BP: (!) 144/80 130/68 (!) 150/92 (!) 152/76  Pulse: 82 75 67 71  Resp: '18 16 18 18  '$ Temp: 98 F (36.7 C) 98.7 F (37.1 C) 97.7 F (36.5 C) 98 F (36.7 C)  TempSrc: Oral Oral Oral Oral  SpO2: 98% 97% 96% 96%  Weight:   100.7 kg   Height:       Weight change:   Physical Exam: General: Alert obese male NAD Heart: RRR, no MRG Lungs: CTA, nonlabored breathing Abdomen: Obese bowel sounds normal active, NTND soft Extremities: Bilateral lower extremity edema trace with erythema from shin down, dry flaky skin Access: Left arm AV fistula patent on hemodialysis  Dialysis Orders: [facility] MWF EDW 104.5 4 hr 15 min  F180 dialyzer BFR 450 DFR A1.5 2K/ 3.5 Ca bath AVF Heparin 5000 u bolus venofer 50 mg weekly mircera 200 mcg q 2 weeks, last given 04/04/20  Problem/Plan: 1volume overload with nonadherence to hemodialysis: Had consecutive 4.5 L UF HD treatments, is below EDW by 4 kg lower EDW at discharge   2 ESRD:MWF- last OP treatment 3/2 and last Rx 3/11 hereprior to admission. HD now on schedule  MWF  3. Hyperkalemia:Resolved: K+ now 3.6. monitor K+ .  4. LE wounds: Consistent with cellulitis / venous stasis . Antibiotics per primary. Dopplers with no JVD  5Hypertension:Blood pressures and volume status improving.,  Lower EDW at discharge questionable 100.0 kg  6. Anemia of ESRD:Hgb 10.2;ESA last given [id number]  7Metabolic Bone Disease: Auryxia as binder, no VDRA  8.  Depression/ho ETOH= on Elavil per admit, needs follow-up with PCP for further adjustments/additional meds?    8, Dispo: SNF  Ernest Haber, PA-C [facility] Beeper [phone] 05/03/2020,11:51 AM  LOS: 3 days   Labs: Basic Metabolic Panel: Recent Labs  Lab  04/29/20 1612 04/29/20 1658 04/30/20 1255 05/01/20 0847  NA 131* 130* 135 132*  K 5.6* 5.6* 4.2 3.6  CL 91*  --  94* 93*  CO2 22  --  26 27  GLUCOSE 105*  --  137* 163*  BUN 98*  --  45* 26*  CREATININE 16.75*  --  9.95* 6.48*  CALCIUM 9.6  --  9.5 8.4*  PHOS 10.8*  --  7.5*  --    Liver Function Tests: Recent Labs  Lab 04/29/20 1612 04/30/20 1255  AST 7*  --   ALT 16  --   ALKPHOS 183*  --   BILITOT 1.3*  --   PROT 7.3  --   ALBUMIN 3.0* 2.8*   Recent Labs  Lab 04/29/20 1612  LIPASE 41   No results for input(s): AMMONIA in the last 168 hours. CBC: Recent Labs  Lab 04/29/20 1612 04/29/20 1658 04/30/20 0810  WBC 9.8  --  8.2  NEUTROABS 7.6  --   --   HGB 9.5* 11.6* 10.2*  HCT 28.7* 34.0* 31.5*  MCV 90.0  --  91.6  PLT 194  --  196   Cardiac Enzymes: Recent Labs  Lab 04/29/20 1612  CKTOTAL 34*   CBG: Recent Labs  Lab 05/02/20 0726  GLUCAP 115*    Studies/Results: No results found. Medications: . cefTRIAXone (ROCEPHIN)  IV 2 g (05/02/20 2131)   . amLODipine  5 mg Oral Daily  . Chlorhexidine Gluconate Cloth  6 each Topical Q0600  . cinacalcet  30 mg Oral Q supper  . ferric citrate  210 mg Oral TID WC  . gabapentin  100 mg Oral QHS  . heparin  5,000 Units Subcutaneous Q8H  . simvastatin  20 mg Oral q1800

## 2020-05-03 NOTE — TOC Initial Note (Signed)
Transition of Care [facility]) - Initial/Assessment Note    Patient Details  Name: Daniel Whitney MRN: [id number] Date of Birth: 11-17-1955  Transition of Care [facility]) CM/SW Contact:    Sable Feil, LCSW Phone Number: 05/03/2020, 3:37 PM  Clinical Narrative:  Talked with patient at the bedside regarding his discharge disposition and the recommendation of [place] rehab. Daniel Whitney was sitting up in bed and was alert, oriented and agreeable to talking with CSW. Daniel Whitney reported that he lives alone in a condo that he is renting. He talked about his falls and feels that there are too many stairs as he has to climb stairs to get into his home and he lives in a [place] condo with his bedroom and main bath/shower upstairs. Patient reported that he fell downstairs in his home and found out that he has an infected right leg wound and a wound on his left leg that is healing.  Daniel Whitney indicated that he uses crutches and a wheelchair at home. Seeking other housing was discussed.    Daniel Whitney reported that he generally drives to HD and had been set-up with transport through his insurance [facility]) however the number of HD trips is limited - 24 trips per year. Discussed [facility] [place] transportation and patient expressed interest and was provided with the information and application.    Daniel Whitney talked briefly about his family. He has 2 daughters (by different women) that live in [place] and one of them is getting married. His youngest daughter is 71 years old, has 2 children by different men and the children are usually with their grandmother per patient. Patient reported that he also has a daughter that lives in [place] and she wants him to live with them, but he does not think it is a good idea, and briefly explained why.  Patient agreeable to ST rehab and was provided with a Medicare.gov SNF list and informed that he can go on the web site to see rating infor on facilities.              Expected Discharge  Plan: Skilled Nursing Facility Barriers to Discharge: Insurance Authorization,SNF Pending bed offer   Patient Goals and CMS Choice Patient states their goals for this hospitalization and ongoing recovery are:: Patient in agreement with ST rehab before returning home CMS Medicare.gov Compare Post Acute Care list provided to:: Patient Represenative (must comment) Choice offered to / list presented to : Patient  Expected Discharge Plan and Services Expected Discharge Plan: [place] In-house Referral: Clinical Social Work     Living arrangements for the past 2 months: Single Family Home ([place] - 2-level)                                     Prior Living Arrangements/Services Living arrangements for the past 2 months: Single Family Home (Condo - 2-level) Lives with:: Self Patient language and need for interpreter reviewed:: No Do you feel safe going back to the place where you live?: No   Patient understands the need for rehab services before returning home  Need for Family Participation in Patient Care: Yes (Comment) Care giver support system in place?: No (comment)   Criminal Activity/Legal Involvement Pertinent to Current Situation/Hospitalization: No - Comment as needed  Activities of Daily Living   ADL Screening (condition at time of admission) Is the patient deaf or have difficulty hearing?: No Does the  patient have difficulty seeing, even when wearing glasses/contacts?: No Does the patient have difficulty concentrating, remembering, or making decisions?: No Does the patient have difficulty walking or climbing stairs?: Yes  Permission Sought/Granted Permission sought to share information with : Family Supports (Brother Melvan Erisman [id number] [phone]) Permission granted to share information with : Yes, Verbal Permission Granted  Share Information with NAME: Daniel Whitney     Permission granted to share info w Relationship: Brother  Permission granted to  share info w Contact Information: [phone] (cell)  Emotional Assessment Appearance:: Appears stated age Attitude/Demeanor/Rapport: Engaged (Patient was not apprehensive) Affect (typically observed): Pleasant Orientation: : Oriented to Self,Oriented to Place,Oriented to  Time,Oriented to Situation Alcohol / Substance Use: Tobacco Use,Alcohol Use,Illicit Drugs (Per H&P, patient reported that he has never smoked, previous alcohol use of 7/8 drinks per week and no drug use) Psych Involvement: No (comment)  Admission diagnosis:  Swelling [R60.9] Pain [R52] ESRD (end stage renal disease) ([place]) [N18.6] Right leg pain [M79.604] ESRD (end stage renal disease) on dialysis ([place]) [N18.6, Z99.2] Cellulitis of right lower extremity [L03.115] Right leg swelling [M79.89] Patient Active Problem List   Diagnosis Date Noted  . Hyperphosphatemia 04/29/2020  . Cellulitis of right lower extremity 04/29/2020  . Noncompliance of patient with renal dialysis ([place]) 01/11/2020  . Prolonged QT interval 02/15/2019  . Shock ([place]) 02/24/2018  . Hyperkalemia, diminished renal excretion 01/01/2018  . Syncope and collapse 04/03/2017  . History of CVA (cerebrovascular accident) 04/03/2017  . CKD (chronic kidney disease) stage V requiring chronic dialysis ([place]) 04/03/2017  . Hypothyroid 04/03/2017  . Refusal of blood transfusions as patient is Jehovah's Witness 04/03/2017  . Prediabetes-HgbA1c 6.2 04/03/2017  . History of subdural hematoma Oct 2018 04/03/2017  . 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 Chronic atrial fibrillation 04/03/2017  . Subdural hematoma ([place]) 11/29/2016  . Peripheral neuropathy 07/26/2016  . At risk for adverse drug reaction 07/03/2016  . Primary osteoarthritis of left knee 06/26/2016  . Postoperative anemia due to acute blood loss 01/27/2016  . S/P knee replacement 01/24/2016  . Low back pain 02/09/2015  . Bilateral knee pain 01/27/2015  .  Atypical atrial flutter ([place]) 01/24/2015  . Acute on chronic combined systolic and diastolic heart failure ([place]) 01/05/2015  . Hyperkalemia 11/08/2014  . Hypocalcemia 11/08/2014  . Recurrent falls 10/13/2014  . Venous stasis ulcer ([place]) 10/13/2014  . Hypoxia   . Leukocytosis   . Respiratory failure ([place])   . ARDS (adult respiratory distress syndrome) ([place])   . Encounter for central line placement   . Cardiopulmonary arrest ([place])   . Compression fracture of L3 lumbar vertebra 08/04/2014  . Anemia of chronic renal failure, unspecified stage 07/07/2014  . Allergic rhinitis 07/07/2014  . Anemia of chronic disease 07/07/2014  . Right rotator cuff tear 04/27/2014  . Acute gouty arthritis 11/11/2013  . Hearing loss in right ear 11/11/2013  . Chronic pain syndrome 11/11/2013  . Right shoulder pain 11/11/2013  . Facial rash 11/01/2013  . Right otitis externa 10/27/2013  . Pulmonary HTN- PA 58 mmHg 04/16/13 09/17/2013  . Right hip pain 08/04/2013  . DOE (dyspnea on exertion) 06/18/2013  . Allergic rhinitis, cause unspecified 06/18/2013  . Carpal tunnel syndrome 06/06/2013  . Primary localized osteoarthrosis, lower leg 06/04/2013  . Preoperative cardiovascular examination 03/10/2013  . Preventative health care 12/30/2012  . AF (paroxysmal atrial fibrillation) ([place]) 12/30/2012  . CHF (congestive heart failure) ([place]) 12/30/2012  . Diabetic retinopathy ([place]) 12/30/2012  .  Morbid obesity ([place]) 12/30/2012  . OSA (obstructive sleep apnea)- on C-pap 12/30/2012  . Bilateral hearing loss 12/30/2012  . Arthritis   . Glaucoma   . Diabetes mellitus with neuropathy ([place])   . Hypertension   . ESRD (end stage renal disease) on dialysis ([place])   . Stroke ([place])   . Hyperlipidemia    PCP:  [name], MD Pharmacy:   [facility] [address] ([address]), [place] [id number] [place] ([place]) [place] [address] Phone: [phone] Fax: [phone]  [facility] - [place], [place] [address] Phone: [phone] Fax: [phone]     Social Determinants of Health (SDOH) Interventions    Readmission Risk Interventions Readmission Risk Prevention Plan 01/12/2020  Transportation Screening Complete  PCP or Specialist Appt within 3-5 Days Complete  HRI or [place] Complete  Social Work Consult for [place] Planning/Counseling Complete  Palliative Care Screening Not Applicable  Medication Review Press photographer) Complete  Some recent data might be hidden

## 2020-05-03 NOTE — Progress Notes (Signed)
PROGRESS NOTE    Daniel Whitney  [id number] DOB: 03-16-1955 DOA: 04/29/2020 PCP: [name], MD    Brief Narrative:  Daniel Whitney is a 65 y.o. male with medical history significant for ESRD on MWF HD, paroxysmal atrial fibrillation not on anticoagulation, history of SDH 2018, HTN, HLD, diet-controlled T2DM, anemia of chronic kidney disease, OSA not on CPAP who presents to the ED for evaluation of leg pain and missed dialysis   3/20-feels much better today. Plan for HD on Monday 3/21-In HD today, tolerating. NO complaints 3/22-PT evaluated pt, needs SNF.   Consultants:   Nephrology  Procedures:   Antimicrobials:       Subjective: Pt without sob, cp, c/o weakness and not being able to walk with PT well.  Objective: Vitals:   05/02/20 1951 05/03/20 0427 05/03/20 1005 05/03/20 1651  BP: 130/68 (!) 150/92 (!) 152/76 (!) 138/91  Pulse: 75 67 71 80  Resp: '16 18 18 18  '$ Temp: 98.7 F (37.1 C) 97.7 F (36.5 C) 98 F (36.7 C) 98.6 F (37 C)  TempSrc: Oral Oral Oral Oral  SpO2: 97% 96% 96% 95%  Weight:  100.7 kg    Height:        Intake/Output Summary (Last 24 hours) at 05/03/2020 1814 Last data filed at 05/03/2020 1300 Gross per 24 hour  Intake 840 ml  Output 0 ml  Net 840 ml   Filed Weights   05/02/20 0852 05/02/20 1254 05/03/20 0427  Weight: 102.3 kg 100.7 kg 100.7 kg    Examination: Cad, nad cta no w/r/r Regular s1/s2 no gallop Soft benign +bs Mild edema, cellulitis improving Aaxoxo3, grossly intact    Data Reviewed: I have personally reviewed following labs and imaging studies  CBC: Recent Labs  Lab 04/29/20 1612 04/29/20 1658 04/30/20 0810  WBC 9.8  --  8.2  NEUTROABS 7.6  --   --   HGB 9.5* 11.6* 10.2*  HCT 28.7* 34.0* 31.5*  MCV 90.0  --  91.6  PLT 194  --  123456   Basic Metabolic Panel: Recent Labs  Lab 04/29/20 1612 04/29/20 1658 04/30/20 1255 05/01/20 0847 05/03/20 1124  NA 131* 130* 135 132* 133*  K 5.6* 5.6* 4.2 3.6 3.6   CL 91*  --  94* 93* 94*  CO2 22  --  '26 27 29  '$ GLUCOSE 105*  --  137* 163* 109*  BUN 98*  --  45* 26* 24*  CREATININE 16.75*  --  9.95* 6.48* 6.37*  CALCIUM 9.6  --  9.5 8.4* 8.5*  MG 2.2  --   --   --   --   PHOS 10.8*  --  7.5*  --   --    GFR: Estimated Creatinine Clearance: 13.5 mL/min (A) (by C-G formula based on SCr of 6.37 mg/dL (H)). Liver Function Tests: Recent Labs  Lab 04/29/20 1612 04/30/20 1255  AST 7*  --   ALT 16  --   ALKPHOS 183*  --   BILITOT 1.3*  --   PROT 7.3  --   ALBUMIN 3.0* 2.8*   Recent Labs  Lab 04/29/20 1612  LIPASE 41   No results for input(s): AMMONIA in the last 168 hours. Coagulation Profile: Recent Labs  Lab 04/29/20 1612  INR 1.3*   Cardiac Enzymes: Recent Labs  Lab 04/29/20 1612  CKTOTAL 34*   BNP (last 3 results) No results for input(s): PROBNP in the last 8760 hours. HbA1C: No results for input(s): HGBA1C in the  last 72 hours. CBG: Recent Labs  Lab 05/02/20 0726  GLUCAP 115*   Lipid Profile: No results for input(s): CHOL, HDL, LDLCALC, TRIG, CHOLHDL, LDLDIRECT in the last 72 hours. Thyroid Function Tests: No results for input(s): TSH, T4TOTAL, FREET4, T3FREE, THYROIDAB in the last 72 hours. Anemia Panel: No results for input(s): VITAMINB12, FOLATE, FERRITIN, TIBC, IRON, RETICCTPCT in the last 72 hours. Sepsis Labs: Recent Labs  Lab 04/29/20 1253  LATICACIDVEN 1.0    Recent Results (from the past 240 hour(s))  Resp Panel by RT-PCR (Flu A&B, Covid) Nasopharyngeal Swab     Status: None   Collection Time: 04/29/20  1:50 PM   Specimen: Nasopharyngeal Swab; Nasopharyngeal(NP) swabs in vial transport medium  Result Value Ref Range Status   SARS Coronavirus 2 by RT PCR NEGATIVE NEGATIVE Final    Comment: (NOTE) SARS-CoV-2 target nucleic acids are NOT DETECTED.  The SARS-CoV-2 RNA is generally detectable in upper respiratory specimens during the acute phase of infection. The lowest concentration of SARS-CoV-2  viral copies this assay can detect is 138 copies/mL. A negative result does not preclude SARS-Cov-2 infection and should not be used as the sole basis for treatment or other patient management decisions. A negative result may occur with  improper specimen collection/handling, submission of specimen other than nasopharyngeal swab, presence of viral mutation(s) within the areas targeted by this assay, and inadequate number of viral copies(<138 copies/mL). A negative result must be combined with clinical observations, patient history, and epidemiological information. The expected result is Negative.  Fact Sheet for Patients:  EntrepreneurPulse.com.au  Fact Sheet for Healthcare Providers:  IncredibleEmployment.be  This test is no t yet approved or cleared by the [place] FDA and  has been authorized for detection and/or diagnosis of SARS-CoV-2 by FDA under an Emergency Use Authorization (EUA). This EUA will remain  in effect (meaning this test can be used) for the duration of the COVID-19 declaration under Section 564(b)(1) of the Act, 21 U.S.C.section 360bbb-3(b)(1), unless the authorization is terminated  or revoked sooner.       Influenza A by PCR NEGATIVE NEGATIVE Final   Influenza B by PCR NEGATIVE NEGATIVE Final    Comment: (NOTE) The Xpert Xpress SARS-CoV-2/FLU/RSV plus assay is intended as an aid in the diagnosis of influenza from Nasopharyngeal swab specimens and should not be used as a sole basis for treatment. Nasal washings and aspirates are unacceptable for Xpert Xpress SARS-CoV-2/FLU/RSV testing.  Fact Sheet for Patients: EntrepreneurPulse.com.au  Fact Sheet for Healthcare Providers: IncredibleEmployment.be  This test is not yet approved or cleared by the [place] FDA and has been authorized for detection and/or diagnosis of SARS-CoV-2 by FDA under an Emergency Use Authorization  (EUA). This EUA will remain in effect (meaning this test can be used) for the duration of the COVID-19 declaration under Section 564(b)(1) of the Act, 21 U.S.C. section 360bbb-3(b)(1), unless the authorization is terminated or revoked.  Performed at [facility], [place] [address], [place], [place] [address]   Culture, blood (routine x 2)     Status: None (Preliminary result)   Collection Time: 04/29/20  4:14 PM   Specimen: Site Not Specified; Blood  Result Value Ref Range Status   Specimen Description SITE NOT SPECIFIED  Final   Special Requests   Final    BOTTLES DRAWN AEROBIC AND ANAEROBIC Blood Culture adequate volume   Culture   Final    NO GROWTH 4 DAYS Performed at [facility], [place] [address]  [place], [place] [address]    Report Status PENDING  Incomplete  Culture, blood (routine x 2)     Status: None (Preliminary result)   Collection Time: 04/30/20  5:36 AM   Specimen: BLOOD  Result Value Ref Range Status   Specimen Description BLOOD SITE NOT SPECIFIED  Final   Special Requests   Final    AEROBIC BOTTLE ONLY Blood Culture results may not be optimal due to an inadequate volume of blood received in culture bottles   Culture   Final    NO GROWTH 3 DAYS Performed at [facility], [place] [address], [place], [place] [address]    Report Status PENDING  Incomplete         Radiology Studies: No results found.      Scheduled Meds: . amitriptyline  50 mg Oral QHS  . [START ON 05/04/2020] amLODipine  5 mg Oral QHS  . Chlorhexidine Gluconate Cloth  6 each Topical Q0600  . cinacalcet  30 mg Oral Q supper  . ferric citrate  210 mg Oral TID WC  . gabapentin  100 mg Oral QHS  . heparin  5,000 Units Subcutaneous Q8H  . simvastatin  20 mg Oral q1800   Continuous Infusions: . cefTRIAXone (ROCEPHIN)  IV 2 g (05/02/20 2131)    Assessment & Plan:   Principal Problem:   ESRD (end stage renal disease) on dialysis [facility]) Active Problems:   Diabetes mellitus  with neuropathy (HCC)   Hyperlipidemia   AF (paroxysmal atrial fibrillation) (HCC)   Anemia of chronic renal failure, unspecified stage   Hyperphosphatemia   Cellulitis of right lower extremity   Daniel Whitney is a 65 y.o. male with medical history significant for ESRD on MWF HD, paroxysmal atrial fibrillation not on anticoagulation, history of SDH 2018, HTN, HLD, diet-controlled T2DM, anemia of chronic kidney disease, OSA not on CPAP, alcohol use disorder who is admitted with RLE cellulitis and volume overload due to nonadherence to dialysis.  ESRD on MWF HD: Presented with worsening peripheral edema due to nonadherence.  Mild pulmonary edema seen on CXR without hypoxia or respiratory symptoms.  3/22- had HD yesterday. Continue with MWF schedule   Chronic venous stasis of lower extremities with superimposed cellulitis of RLE: Given IV vancomycin, ceftriaxone and clindamycin in the ED.  3/22-improving.  Continue ceftriaxone      Hyperphosphatemia: Continue AUryxia  Anemia of chronic kidney disease: Stable , ESA last given 2/21   Paroxysmal atrial fibrillation: On admission in afib with controlled rate. Not on any a/c since prior SDH in 2018 No rate or rhythm control meds   Hyperkalemia-present on admission Improved with dialysis   Hyperlipidemia: Continue simvastatin.  Depression: Reports depressed mood over the last 3 months leading to nonadherence.  He was previously on Elavil for neuropathy.  Restart Elavil for potential benefit.  Recommend follow-up with PCP.  Can consider addition or change to SSRI such as sertraline.  No active SI/HI.  PT/OT rec. SNF  DVT prophylaxis: Heparin Code Status: Full Family Communication: None  Status is: Inpatient  Remains inpatient appropriate because:unsafe discharge Dispo: The patient is from: Home              Anticipated d/c is to: SNF              Patient currently : medical stable.    Difficult to place patient  No            LOS: 3 days   Time spent:  35 minutes with more than 50% on [place], MD [facility] Pager [phone]  If 7PM-7AM, please contact night-coverage 05/03/2020, 6:14 PM

## 2020-05-03 NOTE — Progress Notes (Signed)
Navigator appreciates updates from PT/OT that patient is appropriate and agreeable to SNF. Navigator providing updates to outpatient HD clinic Social Worker at [facility], who has expressed concern for patient's ability care for himself at home to provide continuity of care. Social Worker provided seat schedule: MWF arrive at 12:15pm for a 12:35pm treatment time/pick up at 5:20pm to assist in SNF planning, which Navigator has passed along to unit CSW.   Alphonzo Cruise, [place] Renal Navigator [phone]

## 2020-05-04 ENCOUNTER — Encounter (HOSPITAL_COMMUNITY): Payer: Self-pay | Admitting: Internal Medicine

## 2020-05-04 DIAGNOSIS — Z992 Dependence on renal dialysis: Secondary | ICD-10-CM | POA: Diagnosis not present

## 2020-05-04 DIAGNOSIS — N186 End stage renal disease: Secondary | ICD-10-CM | POA: Diagnosis not present

## 2020-05-04 LAB — CULTURE, BLOOD (ROUTINE X 2)
Culture: NO GROWTH
Special Requests: ADEQUATE

## 2020-05-04 MED ORDER — BUSPIRONE HCL 5 MG PO TABS
5.0000 mg | ORAL_TABLET | Freq: Two times a day (BID) | ORAL | Status: DC
  Administered 2020-05-04 – 2020-05-09 (×11): 5 mg via ORAL
  Filled 2020-05-04 (×11): qty 1

## 2020-05-04 MED ORDER — METHOCARBAMOL 500 MG PO TABS
250.0000 mg | ORAL_TABLET | Freq: Three times a day (TID) | ORAL | Status: DC | PRN
  Administered 2020-05-05 – 2020-05-06 (×3): 250 mg via ORAL
  Filled 2020-05-04 (×3): qty 1

## 2020-05-04 MED ORDER — FERRIC CITRATE 1 GM 210 MG(FE) PO TABS
420.0000 mg | ORAL_TABLET | Freq: Three times a day (TID) | ORAL | Status: DC
  Administered 2020-05-04 – 2020-05-10 (×17): 420 mg via ORAL
  Filled 2020-05-04 (×18): qty 2

## 2020-05-04 MED ORDER — HYDROCERIN EX CREA
TOPICAL_CREAM | Freq: Two times a day (BID) | CUTANEOUS | Status: DC
  Filled 2020-05-04: qty 113

## 2020-05-04 NOTE — Progress Notes (Signed)
Dillon Bjork, RN called and notified the pt's HD tx has been moved to 05/05/2020.

## 2020-05-04 NOTE — Plan of Care (Signed)
  Problem: Education: Goal: Knowledge of General Education information will improve Description: Including pain rating scale, medication(s)/side effects and non-pharmacologic comfort measures Outcome: Progressing   Problem: Activity: Goal: Risk for activity intolerance will decrease Outcome: Progressing   

## 2020-05-04 NOTE — Progress Notes (Signed)
Physical Therapy Treatment Patient Details Name: Daniel Whitney MRN: [id number] DOB: Jul 08, 1955 Today's Date: 05/04/2020    History of Present Illness Daniel Whitney is a 65 y.o. male admitted on 04/29/20 with ESRD after missing several days of HD and complaints of leg pain. PMH includes paroxysmal atrial fibrillation not on anticoagulation, history of SDH 2018, HTN, HLD, diet-controlled T2DM, anemia of chronic kidney disease, and OSA not on CPAP.    PT Comments    Pt received in bed, cooperative and pleasant. Today's session focused on promoting full right knee extension in order to get R foot completely on floor for weightbearing in standing. Pt needed less assistance to get into stand using the stedy. Tolerated left and right weight shifts well, weightbearing more onto R side, but had more difficulty weightshifting forward and backward. Unable to get heel completely on ground, reporting that he felt resistance in the posterior area of his knee. Pt educated that a pulling sensation is expected and okay but he should not feel a significant increase in pain. HEP included quad sets to address knee extension. Pt left in bed in preparation for dialysis with all needs met, call bell within reach, and chair alarm active. Will continue to follow acutely.    Follow Up Recommendations  SNF;Supervision for mobility/OOB     Equipment Recommendations  3in1 (PT)    Recommendations for Other Services       Precautions / Restrictions Precautions Precautions: Fall Restrictions Weight Bearing Restrictions: No    Mobility  Bed Mobility Overal bed mobility: Needs Assistance Bed Mobility: Rolling;Sidelying to Sit Rolling: Supervision Sidelying to sit: HOB elevated;Supervision   Sit to supine: Supervision   General bed mobility comments: Use of rails and HOB elevated, no physical assist given    Transfers Overall transfer level: Needs assistance   Transfers: Sit to/from Stand Sit to Stand: Min  assist         General transfer comment: Min A to power up into standing from stedy, cues for handplacement  Ambulation/Gait             General Gait Details: unable   Stairs             Wheelchair Mobility    Modified Rankin (Stroke Patients Only)       Balance Overall balance assessment: Needs assistance Sitting-balance support: No upper extremity supported;Feet supported Sitting balance-Leahy Scale: Good     Standing balance support: Bilateral upper extremity supported;During functional activity Standing balance-Leahy Scale: Poor Standing balance comment: reliant on B UE support and external assistance                            Cognition Arousal/Alertness: Awake/alert Behavior During Therapy: WFL for tasks assessed/performed Overall Cognitive Status: Within Functional Limits for tasks assessed                                        Exercises Other Exercises Other Exercises: Weight shifting left to right in stedy, 3x10 with 3-5 second hold on right side Other Exercises: Weight shifting forward/backward in stedy, 2x10 Other Exercises: Supine quad set, x10 3-5 second hold    General Comments General comments (skin integrity, edema, etc.): Was able to stand on L LE in stedy using B UE support, unable to stand/march onto R LE due to pain/inability to get R foot fully onto the  ground      Pertinent Vitals/Pain Pain Assessment: Faces Faces Pain Scale: Hurts little more Pain Location: R knee when weightbearing Pain Descriptors / Indicators: Guarding;Grimacing Pain Intervention(s): Monitored during session    Home Living Family/patient expects to be discharged to:: Skilled nursing facility Living Arrangements: Alone                  Prior Function            PT Goals (current goals can now be found in the care plan section) Acute Rehab PT Goals Patient Stated Goal: be able to walk daughter down the aisle in  May    Frequency    Min 3X/week      PT Plan Current plan remains appropriate    Co-evaluation              AM-PAC PT "6 Clicks" Mobility   Outcome Measure  Help needed turning from your back to your side while in a flat bed without using bedrails?: A Little Help needed moving from lying on your back to sitting on the side of a flat bed without using bedrails?: A Little Help needed moving to and from a bed to a chair (including a wheelchair)?: A Lot Help needed standing up from a chair using your arms (e.g., wheelchair or bedside chair)?: A Lot Help needed to walk in hospital room?: A Lot Help needed climbing 3-5 steps with a railing? : A Lot 6 Click Score: 14    End of Session Equipment Utilized During Treatment: Gait belt Activity Tolerance: Patient tolerated treatment well (knee pain) Patient left: with call bell/phone within reach;in bed;with bed alarm set;Other (comment) (physician present in room) Nurse Communication: Mobility status PT Visit Diagnosis: Unsteadiness on feet (R26.81);Muscle weakness (generalized) (M62.81)     Time:  -     Charges:                       Rosita Kea, SPT

## 2020-05-04 NOTE — Progress Notes (Signed)
[facility] Progress Note  Patient: Daniel Whitney    [id number]  DOA: 04/29/2020     Date of Service: the patient was seen and examined on 05/04/2020  Brief hospital course: Past medical history of ESRD on HD MWF, PAF not on any anticoagulation due to SDH history, HTN, HLD, type II DM, OSA not on CPAP, history of nonadherence, recurrent cardiac arrest.  Hyperkalemia from nonadherence to HD.  Presents with complaints of bilateral leg pain. Found to have cellulitis of leg, currently improving. Currently plan is arrange for safe discharge at [facility].  Assessment and Plan: 1.  ESRD on HD MWF. Nonadherence to HD Hyperkalemia Volume overload Presents with noncompliance with HD. Underwent urgent HD in the ER. Currently volume status back to normal. Tolerating HD and agreeable to continue with HD for now. Plan is to go to SNF.  2.  Right lower leg cellulitis Chronic venous stasis bilaterally. Doppler negative for DVT. X-ray right knee negative for any acute abnormality. Treated with IV antibiotics We will switch to p.o. for 2 more days.  3.  Right knee pain. Tells me that his muscles are pulling on his knee. We will try Robaxin.  Monitor.  4.  Depression Likely cause of his nonadherence. Patient is on amitriptyline. Currently requesting something to calm his nerves. We will add BuSpar. Denies any suicidal ideation.  5.  HLD Simvastatin  6.  Anemia of chronic kidney disease Hypokalemia Hypercalcemia Hyperphosphatemia Managed by nephrology  7.  OSA Obesity Noncompliant with CPAP Placing the patient at high risk for poor outcome as well as her current volume overload. Body mass index is 32.79 kg/m.   Diet: Renal diet DVT Prophylaxis:   heparin injection 5,000 Units Start: 04/30/20 0015    Advance goals of care discussion: Full code  Family Communication: no family was present at bedside, at the time of interview.   Disposition:  Status is:  Inpatient  Remains inpatient appropriate because:Unsafe d/c plan   Dispo: The patient is from: Home              Anticipated d/c is to: SNF              Patient currently is not medically stable to d/c.   Difficult to place patient    Subjective: No nausea no vomiting.  No fever no chills or no chest pain.  No shortness of breath.  Reports right leg pain ongoing for last 3 months.  Physical Exam:  General: Appear in mild distress, no Rash; Oral Mucosa Clear, moist. no Abnormal Neck Mass Or lumps, Conjunctiva normal  Cardiovascular: S1 and S2 Present, no Murmur, Respiratory: good respiratory effort, Bilateral Air entry present and CTA, no Crackles, no wheezes Abdomen: Bowel Sound present, Soft and no tenderness Extremities: Bilateral chronic appearing pedal edema Neurology: alert and oriented to time, place, and person affect appropriate. no new focal deficit Gait not checked due to patient safety concerns  Vitals:   05/03/20 1651 05/03/20 2054 05/04/20 0551 05/04/20 1040  BP: (!) 138/91 (!) 148/78 (!) 145/91 (!) 157/71  Pulse: 80 67 63 (!) 59  Resp: '18 19 18 18  '$ Temp: 98.6 F (37 C) 98.6 F (37 C) 98.6 F (37 C) 98.5 F (36.9 C)  TempSrc: Oral Oral  Oral  SpO2: 95% 95% 100% 100%  Weight:  100.7 kg    Height:        Intake/Output Summary (Last 24 hours) at 05/04/2020 1742 Last data filed at 05/04/2020 1300 Gross per  24 hour  Intake 600 ml  Output 0 ml  Net 600 ml   Filed Weights   05/02/20 1254 05/03/20 0427 05/03/20 2054  Weight: 100.7 kg 100.7 kg 100.7 kg    Data Reviewed: I have personally reviewed and interpreted daily labs, tele strips, imaging. I reviewed all nursing notes, pharmacy notes, vitals, pertinent old records I have discussed plan of care as described above with RN and patient/family.  CBC: Recent Labs  Lab 04/29/20 1612 04/29/20 1658 04/30/20 0810  WBC 9.8  --  8.2  NEUTROABS 7.6  --   --   HGB 9.5* 11.6* 10.2*  HCT 28.7* 34.0* 31.5*   MCV 90.0  --  91.6  PLT 194  --  [id number]   Basic Metabolic Panel: Recent Labs  Lab 04/29/20 1612 04/29/20 1658 04/30/20 1255 05/01/20 0847 05/03/20 1124  NA 131* 130* 135 132* 133*  K 5.6* 5.6* 4.2 3.6 3.6  CL 91*  --  94* 93* 94*  CO2 22  --  '26 27 29  '$ GLUCOSE 105*  --  137* 163* 109*  BUN 98*  --  45* 26* 24*  CREATININE 16.75*  --  9.95* 6.48* 6.37*  CALCIUM 9.6  --  9.5 8.4* 8.5*  MG 2.2  --   --   --   --   PHOS 10.8*  --  7.5*  --   --     Studies: No results found.  Scheduled Meds: . amitriptyline  50 mg Oral QHS  . amLODipine  5 mg Oral QHS  . Chlorhexidine Gluconate Cloth  6 each Topical Q0600  . cinacalcet  30 mg Oral Q supper  . ferric citrate  420 mg Oral TID WC  . gabapentin  100 mg Oral QHS  . heparin  5,000 Units Subcutaneous Q8H  . simvastatin  20 mg Oral q1800   Continuous Infusions: . cefTRIAXone (ROCEPHIN)  IV 2 g (05/03/20 2152)   PRN Meds: acetaminophen **OR** acetaminophen, methocarbamol  Time spent: 35 minutes  Author: Berle Mull, MD [facility] 05/04/2020 5:42 PM  To reach On-call, see care teams to locate the attending and reach out via [email or web address]. Between 7PM-7AM, please contact night-coverage If you still have difficulty reaching the attending provider, please page the [facility] (Director on Call) for [facility] on amion for assistance.

## 2020-05-04 NOTE — Care Management Important Message (Signed)
Important Message  Patient Details  Name: Daniel Whitney MRN: [id number] Date of Birth: 03-27-1955   Medicare Important Message Given:  Yes     [name] 05/04/2020, 2:55 PM

## 2020-05-04 NOTE — Progress Notes (Signed)
Subjective: Just finished working with PT, no complaints, for HD today  Objective Vital signs in last 24 hours: Vitals:   05/03/20 1651 05/03/20 2054 05/04/20 0551 05/04/20 1040  BP: (!) 138/91 (!) 148/78 (!) 145/91 (!) 157/71  Pulse: 80 67 63 (!) 59  Resp: '18 19 18 18  '$ Temp: 98.6 F (37 C) 98.6 F (37 C) 98.6 F (37 C) 98.5 F (36.9 C)  TempSrc: Oral Oral  Oral  SpO2: 95% 95% 100% 100%  Weight:  100.7 kg    Height:       Weight change: -1.58 kg  Physical Exam: General:Alert obese male NAD Heart:RRR, no MRG Lungs:CTA, nonlabored breathing Abdomen:Obese bowel sounds normal active, NTND soft Extremities:Bilateral lower extremity edema trace with erythema from shin down, dry flaky skin Access:Left arm AV fistula patent on hemodialysis  Dialysis Orders: [facility] MWF EDW 104.5 4 hr 15 min  F180 dialyzer BFR 450 DFR A1.5 2K/ 3.5 Ca bath AVF Heparin 5000 u bolus venofer 50 mg weekly mircera 200 mcg q 2 weeks, last given 04/04/20  Problem/Plan: 1volume overload with nonadherence to hemodialysis:Had consecutive 4.5 L UF HD treatments, is below EDW by 4 kg lower EDW at discharge  2 ESRD:MWF- last OP treatment 3/2 and last Rx 3/11 hereprior to admission. HD now on schedule MWF  3. Hyperkalemia:Resolved: K+ now 3.6. monitor K+ .  4. LE wounds:Consistent with cellulitis/venous stasis . Antibiotics per primary. Dopplers with no JVD  5Hypertension:Blood pressures and volume status improving.,  Lower EDW at discharge questionable 100.0 kg  6. Anemia of ESRD:Hgb 10.2;ESA last given [id number]  7Metabolic Bone Disease: Auryxia as binder, no VDRA  8.  Depression/ho ETOH= on Elavil per admit, needs follow-up with PCP for further adjustments/additional meds?    8, Dispo: SNF  Ernest Haber, PA-C [place] [phone] 05/04/2020,11:21 AM  LOS: 4 days   Labs: Basic Metabolic Panel: Recent Labs  Lab 04/29/20 1612  04/29/20 1658 04/30/20 1255 05/01/20 0847 05/03/20 1124  NA 131*   < > 135 132* 133*  K 5.6*   < > 4.2 3.6 3.6  CL 91*  --  94* 93* 94*  CO2 22  --  '26 27 29  '$ GLUCOSE 105*  --  137* 163* 109*  BUN 98*  --  45* 26* 24*  CREATININE 16.75*  --  9.95* 6.48* 6.37*  CALCIUM 9.6  --  9.5 8.4* 8.5*  PHOS 10.8*  --  7.5*  --   --    < > = values in this interval not displayed.   Liver Function Tests: Recent Labs  Lab 04/29/20 1612 04/30/20 1255  AST 7*  --   ALT 16  --   ALKPHOS 183*  --   BILITOT 1.3*  --   PROT 7.3  --   ALBUMIN 3.0* 2.8*   Recent Labs  Lab 04/29/20 1612  LIPASE 41   No results for input(s): AMMONIA in the last 168 hours. CBC: Recent Labs  Lab 04/29/20 1612 04/29/20 1658 04/30/20 0810  WBC 9.8  --  8.2  NEUTROABS 7.6  --   --   HGB 9.5* 11.6* 10.2*  HCT 28.7* 34.0* 31.5*  MCV 90.0  --  91.6  PLT 194  --  196   Cardiac Enzymes: Recent Labs  Lab 04/29/20 1612  CKTOTAL 34*   CBG: Recent Labs  Lab 05/02/20 0726  GLUCAP 115*    Studies/Results: No results found. Medications: . cefTRIAXone (ROCEPHIN)  IV 2  g (05/03/20 2152)   . amitriptyline  50 mg Oral QHS  . amLODipine  5 mg Oral QHS  . Chlorhexidine Gluconate Cloth  6 each Topical Q0600  . cinacalcet  30 mg Oral Q supper  . ferric citrate  420 mg Oral TID WC  . gabapentin  100 mg Oral QHS  . heparin  5,000 Units Subcutaneous Q8H  . simvastatin  20 mg Oral q1800

## 2020-05-05 DIAGNOSIS — N186 End stage renal disease: Secondary | ICD-10-CM | POA: Diagnosis not present

## 2020-05-05 DIAGNOSIS — Z992 Dependence on renal dialysis: Secondary | ICD-10-CM | POA: Diagnosis not present

## 2020-05-05 LAB — RENAL FUNCTION PANEL
Albumin: 2.7 g/dL — ABNORMAL LOW (ref 3.5–5.0)
Anion gap: 11 (ref 5–15)
BUN: 40 mg/dL — ABNORMAL HIGH (ref 8–23)
CO2: 26 mmol/L (ref 22–32)
Calcium: 8.3 mg/dL — ABNORMAL LOW (ref 8.9–10.3)
Chloride: 95 mmol/L — ABNORMAL LOW (ref 98–111)
Creatinine, Ser: 9.19 mg/dL — ABNORMAL HIGH (ref 0.61–1.24)
GFR, Estimated: 6 mL/min — ABNORMAL LOW (ref >60)
Glucose, Bld: 134 mg/dL — ABNORMAL HIGH (ref 70–99)
Phosphorus: 5.3 mg/dL — ABNORMAL HIGH (ref 2.5–4.6)
Potassium: 3.8 mmol/L (ref 3.5–5.1)
Sodium: 132 mmol/L — ABNORMAL LOW (ref 135–145)

## 2020-05-05 LAB — CULTURE, BLOOD (ROUTINE X 2): Culture: NO GROWTH

## 2020-05-05 LAB — CBC
HCT: 29.3 % — ABNORMAL LOW (ref 39.0–52.0)
Hemoglobin: 9.3 g/dL — ABNORMAL LOW (ref 13.0–17.0)
MCH: 30 pg (ref 26.0–34.0)
MCHC: 31.7 g/dL (ref 30.0–36.0)
MCV: 94.5 fL (ref 80.0–100.0)
Platelets: 153 10*3/uL (ref 150–400)
RBC: 3.1 MIL/uL — ABNORMAL LOW (ref 4.22–5.81)
RDW: 18.4 % — ABNORMAL HIGH (ref 11.5–15.5)
WBC: 5.7 10*3/uL (ref 4.0–10.5)
nRBC: 0 % (ref 0.0–0.2)

## 2020-05-05 NOTE — Progress Notes (Signed)
Subjective: Off HD schedule 2/2 emergent pts / pt load, No cos On hd. awaiting  [facility]     Objective Vital signs in last 24 hours: Vitals:   05/05/20 0740 05/05/20 0752 05/05/20 0800 05/05/20 0830  BP: 138/85 131/80 (!) 142/78 118/70  Pulse: 68 66 60 74  Resp: (!) 21 19    Temp: 97.9 F (36.6 C)     TempSrc: Oral     SpO2: 96%     Weight: 105.7 kg     Height:       Weight change: 0 kg   Physical Exam: General:Alert obese male NAD Heart:RRR, no MRG Lungs:CTA, nonlabored breathing Abdomen:Obese bowel sounds normal active, NTND soft Extremities: Dry flaky skin Bilat. lower extremity edema and  erythema resolving , Access:LUA AVF patent on hemodialysis  Dialysis Orders: [facility] MWF EDW 104.5 4 hr 15 min  F180 dialyzer BFR 450 DFR A1.5 2K/ 3.5 Ca bath AVF Heparin 5000 u bolus venofer 50 mg weekly mircera 200 mcg q 2 weeks, last given 04/04/20  Problem/Plan: 1volume overload with nonadherence to hemodialysis txs :Had consecutive 4.5 L UF HD treatments, had wt below EDW by 4 kg lower EDW at discharge/need stand wt attempt 4 l uf as tolerated  Today   2 ESRD:MWF- hd today 2/2 yest ,emergent pt load, k 3.8 today, Next hd sat then Monday  Unless dc sooner, then hd tomor 3hr  Then mon .  3. Hyperkalemia(2/2 missed hd) :Resolved: K+ now 3.8 . monitor K+ .  4. LE wounds:Consistent with cellulitis/venous stasis . Antibiotics per primary. Now on Po ,Dopplers with no JVD  5Hypertension:Blood pressures and volume status improving.,  Lower EDW at discharge ~~ 100.0 kg , Said had stand wt this am 105.7 ,FU bp trend wts  .On Amlodpine 5 mg  HS if bp drops could dc   6. Anemia of ESRD:Hgb 10.2;ESA last given 123XX123  7Metabolic Bone Disease: Auryxia as binder, no VDRA  8.  Depression/ho ETOH= on Elavil per admit, needs follow-up with PCP for further adjustments/additional meds?  Buspar 5 mg bid  added  05/04/20  8, Dispo:  awaiting SNF placement   Ernest Haber, PA-C [place] [phone] 05/05/2020,8:49 AM  LOS: 5 days   Labs: Basic Metabolic Panel: Recent Labs  Lab 04/29/20 1612 04/29/20 1658 04/30/20 1255 05/01/20 0847 05/03/20 1124  NA 131*   < > 135 132* 133*  K 5.6*   < > 4.2 3.6 3.6  CL 91*  --  94* 93* 94*  CO2 22  --  '26 27 29  '$ GLUCOSE 105*  --  137* 163* 109*  BUN 98*  --  45* 26* 24*  CREATININE 16.75*  --  9.95* 6.48* 6.37*  CALCIUM 9.6  --  9.5 8.4* 8.5*  PHOS 10.8*  --  7.5*  --   --    < > = values in this interval not displayed.   Liver Function Tests: Recent Labs  Lab 04/29/20 1612 04/30/20 1255  AST 7*  --   ALT 16  --   ALKPHOS 183*  --   BILITOT 1.3*  --   PROT 7.3  --   ALBUMIN 3.0* 2.8*   Recent Labs  Lab 04/29/20 1612  LIPASE 41   No results for input(s): AMMONIA in the last 168 hours. CBC: Recent Labs  Lab 04/29/20 1612 04/29/20 1658 04/30/20 0810 05/05/20 0747  WBC 9.8  --  8.2 5.7  NEUTROABS 7.6  --   --   --  HGB 9.5* 11.6* 10.2* 9.3*  HCT 28.7* 34.0* 31.5* 29.3*  MCV 90.0  --  91.6 94.5  PLT 194  --  196 153   Cardiac Enzymes: Recent Labs  Lab 04/29/20 1612  CKTOTAL 34*   CBG: Recent Labs  Lab 05/02/20 0726  GLUCAP 115*    Studies/Results: No results found. Medications:  cefTRIAXone (ROCEPHIN)  IV 2 g (05/04/20 2207)    amitriptyline  50 mg Oral QHS   amLODipine  5 mg Oral QHS   busPIRone  5 mg Oral BID   Chlorhexidine Gluconate Cloth  6 each Topical Q0600   cinacalcet  30 mg Oral Q supper   ferric citrate  420 mg Oral TID WC   gabapentin  100 mg Oral QHS   heparin  5,000 Units Subcutaneous Q8H   hydrocerin   Topical BID   simvastatin  20 mg Oral q1800

## 2020-05-05 NOTE — TOC Progression Note (Addendum)
Transition of Care [facility]) - Progression Note    Patient Details  Name: Daniel Whitney MRN: [id number] Date of Birth: Jan 08, 1956  Transition of [facility]) CM/SW Contact  Sharlet Salina Mila Homer, LCSW Phone Number: 05/05/2020, 2:43 PM  Clinical Narrative: Visited with patient and provided him with bed offers:[facility], [place], [place], [facility] and [place]. Patient advised that the [facility] facility was sent infor in error and patient indicated that he is not interested in that facility. Mr. Ocasio informed CSW that he wants to talk with his brother Daniel Whitney and he was given his home number: [phone]. CSW was going to provide the cell number, however patient indicated that his brother does not have that phone anymore. Patient informed that he is near readiness for discharge and a SNF decision needed today.  Patient indicated that he is thinking about moving to [place], [place] where family is and maybe going to a SNF there. CSW informed patient that if he is transported by non-emergency ambulance to the facility, the cost would be out-of pocket and very expensive.  CSW received a call (1:35 pm) from [place] with [facility] APS regarding patient. CSW advised that they received a report on patient. Ms. Tobin Chad informed that CSW currently working with patient on SNF placement and can inform her when he discharges. Her phone numbers are: [phone] (desk #) and [phone] (work cell and best way to reach her).    4:23 pm: Returned to talk with patient and he has chosen [facility] for rehab. CSW informed Mr. Bucker that he will be assisted with the [facility] paperwork on Friday. Call made to [facility] and message left for [facility], admissions director regarding patient.    Expected Discharge Plan: Skilled Nursing Facility Barriers to Discharge: Insurance Authorization,SNF Pending bed offer  Expected Discharge Plan and  Services Expected Discharge Plan: [place] In-house Referral: Clinical Social Work     Living arrangements for the past 2 months: Single Family Home (Condo - 2-level)                                       Social Determinants of Health (SDOH) Interventions  No SDOH interventions requested or needed at this time.   Readmission Risk Interventions Readmission Risk Prevention Plan 01/12/2020  Transportation Screening Complete  PCP or Specialist Appt within 3-5 Days Complete  HRI or [place] Complete  Social Work Consult for [place] Planning/Counseling Complete  Palliative Care Screening Not Applicable  Medication Review [employer]) Complete  Some recent data might be hidden

## 2020-05-05 NOTE — Plan of Care (Signed)
  Problem: Nutritional: Goal: Ability to make healthy dietary choices will improve Outcome: Progressing   

## 2020-05-05 NOTE — Progress Notes (Signed)
[facility] Progress Note  Patient: Daniel Whitney    [id number]  DOA: 04/29/2020     Date of Service: the patient was seen and examined on 05/05/2020  Brief hospital course: Past medical history of ESRD on HD MWF, PAF not on any anticoagulation due to SDH history, HTN, HLD, type II DM, OSA not on CPAP, history of nonadherence, recurrent cardiac arrest.  Hyperkalemia from nonadherence to HD.  Presents with complaints of bilateral leg pain. Found to have cellulitis of leg, currently improving.  Currently plan is arrange for safe discharge at [facility].  Medically stable.  Assessment and Plan: 1.  ESRD on HD MWF. Nonadherence to HD Hyperkalemia Volume overload Presents with noncompliance with HD. Underwent urgent HD in the ER. Currently volume status back to normal. Tolerating HD and agreeable to continue with HD for now. Plan is to go to SNF.  2.  Right lower leg cellulitis Chronic venous stasis bilaterally. Doppler negative for DVT. X-ray right knee negative for any acute abnormality. Treated with IV antibiotics We will switch to p.o. for 2 more days.  3.  Right knee pain. Tells me that his muscles are pulling on his knee. We will try Robaxin.  Monitor.  4.  Depression Likely cause of his nonadherence. Patient is on amitriptyline. Currently requesting something to calm his nerves. We will add BuSpar. Denies any suicidal ideation.  5.  HLD Simvastatin  6.  Anemia of chronic kidney disease Hypokalemia Hypercalcemia Hyperphosphatemia Managed by nephrology  7.  OSA Obesity Noncompliant with CPAP Placing the patient at high risk for poor outcome as well as her current volume overload. Body mass index is 33.21 kg/m.   Diet: Renal diet DVT Prophylaxis:   heparin injection 5,000 Units Start: 04/30/20 0015    Advance goals of care discussion: Full code  Family Communication: no family was present at bedside, at the time of interview.   Disposition:  Status is:  Inpatient  Remains inpatient appropriate because:Unsafe d/c plan   Dispo: The patient is from: Home              Anticipated d/c is to: SNF              Patient currently is not medically stable to d/c.   Difficult to place patient    Subjective: No acute complaint.  No fever no chills.  Physical Exam:  General: Appear in mild distress, no Rash; Oral Mucosa Clear, moist. no Abnormal Neck Mass Or lumps, Conjunctiva normal  Cardiovascular: S1 and S2 Present, no Murmur, Respiratory: good respiratory effort, Bilateral Air entry present and CTA, no Crackles, no wheezes Abdomen: Bowel Sound present, Soft and no tenderness Extremities: no Pedal edema Neurology: alert and oriented to time, place, and person affect appropriate. no new focal deficit Gait not checked due to patient safety concerns   Vitals:   05/05/20 1100 05/05/20 1130 05/05/20 1153 05/05/20 1238  BP: (!) 164/87 (!) 152/81 (!) 145/79 (!) 172/89  Pulse: 67 64 60 73  Resp:   20 18  Temp:   98.6 F (37 C) 98.3 F (36.8 C)  TempSrc:   Oral   SpO2:   96% 100%  Weight:   102 kg   Height:        Intake/Output Summary (Last 24 hours) at 05/05/2020 1933 Last data filed at 05/05/2020 1700 Gross per 24 hour  Intake 1240 ml  Output 3500 ml  Net -2260 ml   Filed Weights   05/04/20 2118 05/05/20 0740  05/05/20 1153  Weight: 100.7 kg 105.7 kg 102 kg    Data Reviewed: I have personally reviewed and interpreted daily labs, tele strips, imaging. I reviewed all nursing notes, pharmacy notes, vitals, pertinent old records I have discussed plan of care as described above with RN and patient/family.  CBC: Recent Labs  Lab 04/29/20 1612 04/29/20 1658 04/30/20 0810 05/05/20 0747  WBC 9.8  --  8.2 5.7  NEUTROABS 7.6  --   --   --   HGB 9.5* 11.6* 10.2* 9.3*  HCT 28.7* 34.0* 31.5* 29.3*  MCV 90.0  --  91.6 94.5  PLT 194  --  196 0000000   Basic Metabolic Panel: Recent Labs  Lab 04/29/20 1612 04/29/20 1658 04/30/20 1255  05/01/20 0847 05/03/20 1124 05/05/20 0747  NA 131* 130* 135 132* 133* 132*  K 5.6* 5.6* 4.2 3.6 3.6 3.8  CL 91*  --  94* 93* 94* 95*  CO2 22  --  '26 27 29 26  '$ GLUCOSE 105*  --  137* 163* 109* 134*  BUN 98*  --  45* 26* 24* 40*  CREATININE 16.75*  --  9.95* 6.48* 6.37* 9.19*  CALCIUM 9.6  --  9.5 8.4* 8.5* 8.3*  MG 2.2  --   --   --   --   --   PHOS 10.8*  --  7.5*  --   --  5.3*    Studies: No results found.  Scheduled Meds: . amitriptyline  50 mg Oral QHS  . amLODipine  5 mg Oral QHS  . busPIRone  5 mg Oral BID  . Chlorhexidine Gluconate Cloth  6 each Topical Q0600  . cinacalcet  30 mg Oral Q supper  . ferric citrate  420 mg Oral TID WC  . gabapentin  100 mg Oral QHS  . heparin  5,000 Units Subcutaneous Q8H  . hydrocerin   Topical BID  . simvastatin  20 mg Oral q1800   Continuous Infusions: . cefTRIAXone (ROCEPHIN)  IV 2 g (05/04/20 2207)   PRN Meds: acetaminophen **OR** acetaminophen, methocarbamol  Time spent: 35 minutes  Author: Berle Mull, MD [facility] 05/05/2020 7:33 PM  To reach On-call, see care teams to locate the attending and reach out via [email or web address]. Between 7PM-7AM, please contact night-coverage If you still have difficulty reaching the attending provider, please page the [facility] (Director on Call) for [facility] on amion for assistance.

## 2020-05-06 DIAGNOSIS — N186 End stage renal disease: Secondary | ICD-10-CM | POA: Diagnosis not present

## 2020-05-06 DIAGNOSIS — Z992 Dependence on renal dialysis: Secondary | ICD-10-CM | POA: Diagnosis not present

## 2020-05-06 LAB — RENAL FUNCTION PANEL
Albumin: 2.7 g/dL — ABNORMAL LOW (ref 3.5–5.0)
Anion gap: 10 (ref 5–15)
BUN: 22 mg/dL (ref 8–23)
CO2: 27 mmol/L (ref 22–32)
Calcium: 8.2 mg/dL — ABNORMAL LOW (ref 8.9–10.3)
Chloride: 94 mmol/L — ABNORMAL LOW (ref 98–111)
Creatinine, Ser: 5.9 mg/dL — ABNORMAL HIGH (ref 0.61–1.24)
GFR, Estimated: 10 mL/min — ABNORMAL LOW (ref >60)
Glucose, Bld: 162 mg/dL — ABNORMAL HIGH (ref 70–99)
Phosphorus: 2.9 mg/dL (ref 2.5–4.6)
Potassium: 3.5 mmol/L (ref 3.5–5.1)
Sodium: 131 mmol/L — ABNORMAL LOW (ref 135–145)

## 2020-05-06 LAB — CBC
HCT: 30.7 % — ABNORMAL LOW (ref 39.0–52.0)
Hemoglobin: 9.4 g/dL — ABNORMAL LOW (ref 13.0–17.0)
MCH: 29.7 pg (ref 26.0–34.0)
MCHC: 30.6 g/dL (ref 30.0–36.0)
MCV: 97.2 fL (ref 80.0–100.0)
Platelets: 123 10*3/uL — ABNORMAL LOW (ref 150–400)
RBC: 3.16 MIL/uL — ABNORMAL LOW (ref 4.22–5.81)
RDW: 18.4 % — ABNORMAL HIGH (ref 11.5–15.5)
WBC: 6 10*3/uL (ref 4.0–10.5)
nRBC: 0 % (ref 0.0–0.2)

## 2020-05-06 MED ORDER — LIDOCAINE HCL (PF) 1 % IJ SOLN: 5.0000 mL | INTRAMUSCULAR | Status: DC | PRN

## 2020-05-06 MED ORDER — LIDOCAINE-PRILOCAINE 2.5-2.5 % EX CREA
1.0000 | TOPICAL_CREAM | CUTANEOUS | Status: DC | PRN
Start: 2020-05-06 — End: 2020-05-06

## 2020-05-06 MED ORDER — SODIUM CHLORIDE 0.9 % IV SOLN
100.0000 mL | INTRAVENOUS | Status: DC | PRN
Start: 2020-05-06 — End: 2020-05-06

## 2020-05-06 MED ORDER — PENTAFLUOROPROP-TETRAFLUOROETH EX AERO: 1.0000 "application " | INHALATION_SPRAY | CUTANEOUS | Status: DC | PRN

## 2020-05-06 MED ORDER — CAMPHOR-MENTHOL 0.5-0.5 % EX LOTN
TOPICAL_LOTION | CUTANEOUS | Status: DC | PRN
  Filled 2020-05-06: qty 222

## 2020-05-06 MED ORDER — ALTEPLASE 2 MG IJ SOLR: 2.0000 mg | Freq: Once | INTRAMUSCULAR | Status: DC | PRN

## 2020-05-06 MED ORDER — CHLORHEXIDINE GLUCONATE CLOTH 2 % EX PADS: 6.0000 | MEDICATED_PAD | Freq: Every day | CUTANEOUS | Status: DC

## 2020-05-06 MED ORDER — HEPARIN SODIUM (PORCINE) 1000 UNIT/ML DIALYSIS
1000.0000 [IU] | INTRAMUSCULAR | Status: DC | PRN
Start: 2020-05-06 — End: 2020-05-06

## 2020-05-06 MED ORDER — DARBEPOETIN ALFA 100 MCG/0.5ML IJ SOSY
PREFILLED_SYRINGE | INTRAMUSCULAR | Status: AC
  Filled 2020-05-06: qty 0.5

## 2020-05-06 MED ORDER — HEPARIN SODIUM (PORCINE) 1000 UNIT/ML DIALYSIS
20.0000 [IU]/kg | INTRAMUSCULAR | Status: DC | PRN
Start: 2020-05-06 — End: 2020-05-06

## 2020-05-06 MED ORDER — SODIUM CHLORIDE 0.9 % IV SOLN: 100.0000 mL | INTRAVENOUS | Status: DC | PRN

## 2020-05-06 MED ORDER — DARBEPOETIN ALFA 100 MCG/0.5ML IJ SOSY
100.0000 ug | PREFILLED_SYRINGE | INTRAMUSCULAR | Status: DC
  Administered 2020-05-06: 100 ug via INTRAVENOUS
  Filled 2020-05-06: qty 0.5

## 2020-05-06 NOTE — Plan of Care (Signed)
  Problem: Health Behavior/Discharge Planning: Goal: Ability to manage health-related needs will improve Outcome: Progressing   

## 2020-05-06 NOTE — Progress Notes (Addendum)
Subjective:  Completed HD yesterday off schedule -net UF 3.5L. Tolerated well  Seen in room, c/o itching lower extremities. No sob, cp .    Objective Vital signs in last 24 hours: Vitals:   05/05/20 1238 05/05/20 2136 05/06/20 0500 05/06/20 0540  BP: (!) 172/89 136/86  132/78  Pulse: 73 74  70  Resp: '18 18  18  '$ Temp: 98.3 F (36.8 C) 98.9 F (37.2 C)  98.1 F (36.7 C)  TempSrc:  Oral  Oral  SpO2: 100% 96%  96%  Weight:   100.2 kg   Height:        Physical Exam: General:Alert, sitting up at bedside  Heart:Irregular, no MRG Lungs:Clear bilaterally , nonlabored breathing Abdomen:Soft non-tender  Extremities: Trace edema +venous stasis skin changes, erosions from scratching  Access:LUE AVF +bruit   Dialysis Orders: [facility] MWF EDW 104.5 4 hr 15 min  F180 dialyzer BFR 450 DFR A1.5 2K/ 3.5 Ca bath AVF Heparin 5000 u bolus venofer 50 mg weekly mircera 200 mcg q 2 weeks, last given 04/04/20  Problem/Plan: 1Volume overload with nonadherence to hemodialysis txs : Improving with serial HD here. Now below outpatient dry weight. Continue UF as tolerated   2 ESRD: HD MWF- Had HD off schedule yesterday. Short HD today 3/25 to get back on schedule.   3. Hyperkalemia(2/2 missed HD ) :Resolved with HD.   4. LE wounds:Consistent with cellulitis/venous stasis . Antibiotics per primary - on Rocephin. Doppler study negative for DVT.   5Hypertension:Blood pressures and volume status improving with HD.  On amlodipine '5mg'$ . Will have lower EDW at discharge ~~ 100.0 kg. Post HD wt 102. Kg on 3/24.   6. Anemia of ESRD:Hgb 10.2 >9.3. OnESA last given 2/21. Give Aranesp 100 with HD today [id number]   7Metabolic Bone Disease: Auryxia as binder, no VDRA  8.  Depression/ho ETOH:  on Elavil per admit, needs follow-up with PCP for further adjustments/additional meds?  Buspar 5 mg bid  added  05/04/20  8, Dispo:  awaiting SNF placement    Lynnda Child  PA-C [facility] 05/06/2020,9:13 AM    Labs: Basic Metabolic Panel: Recent Labs  Lab 04/29/20 1612 04/29/20 1658 04/30/20 1255 05/01/20 0847 05/03/20 1124 05/05/20 0747  NA 131*   < > 135 132* 133* 132*  K 5.6*   < > 4.2 3.6 3.6 3.8  CL 91*  --  94* 93* 94* 95*  CO2 22  --  '26 27 29 26  '$ GLUCOSE 105*  --  137* 163* 109* 134*  BUN 98*  --  45* 26* 24* 40*  CREATININE 16.75*  --  9.95* 6.48* 6.37* 9.19*  CALCIUM 9.6  --  9.5 8.4* 8.5* 8.3*  PHOS 10.8*  --  7.5*  --   --  5.3*   < > = values in this interval not displayed.   Liver Function Tests: Recent Labs  Lab 04/29/20 1612 04/30/20 1255 05/05/20 0747  AST 7*  --   --   ALT 16  --   --   ALKPHOS 183*  --   --   BILITOT 1.3*  --   --   PROT 7.3  --   --   ALBUMIN 3.0* 2.8* 2.7*   Recent Labs  Lab 04/29/20 1612  LIPASE 41   No results for input(s): AMMONIA in the last 168 hours. CBC: Recent Labs  Lab 04/29/20 1612 04/29/20 1658 04/30/20 0810 05/05/20 0747  WBC 9.8  --  8.2 5.7  NEUTROABS 7.6  --   --   --   HGB 9.5* 11.6* 10.2* 9.3*  HCT 28.7* 34.0* 31.5* 29.3*  MCV 90.0  --  91.6 94.5  PLT 194  --  196 153   Cardiac Enzymes: Recent Labs  Lab 04/29/20 1612  CKTOTAL 34*   CBG: Recent Labs  Lab 05/02/20 0726  GLUCAP 115*    Studies/Results: No results found. Medications: . cefTRIAXone (ROCEPHIN)  IV 2 g (05/05/20 2155)   . amitriptyline  50 mg Oral QHS  . amLODipine  5 mg Oral QHS  . busPIRone  5 mg Oral BID  . Chlorhexidine Gluconate Cloth  6 each Topical Q0600  . cinacalcet  30 mg Oral Q supper  . ferric citrate  420 mg Oral TID WC  . gabapentin  100 mg Oral QHS  . heparin  5,000 Units Subcutaneous Q8H  . hydrocerin   Topical BID  . simvastatin  20 mg Oral q1800

## 2020-05-06 NOTE — TOC Progression Note (Addendum)
Transition of Care [facility]) - Progression Note    Patient Details  Name: Kvon Trundy MRN: [id number] Date of Birth: 1956/01/19  Transition of Care [facility]) CM/SW Contact  Sharlet Salina Mila Homer, LCSW Phone Number: 05/06/2020, 6:29 PM  Clinical Narrative: Call made to [facility], admissions director at [facility] and message left to return. Received a call from admissions director indicating that they can take patient pending insurance authorization. Contact made with [employer] and patient is not managed by them. Reesheema informed that Everlene Balls does not manage and she will initiate insurance authorization with [employer]  PT/OT office called and CSW advised that PT will see patient today and OT will be advised that patient needs to be seen as both last seen on 3/25.       Expected Discharge Plan: Skilled Nursing Facility Barriers to Discharge: Insurance Authorization,SNF Pending bed offer  Expected Discharge Plan and Services Expected Discharge Plan: [place] In-house Referral: Clinical Social Work     Living arrangements for the past 2 months: Single Family Home (Condo - 2-level)                                       Social Determinants of Health (SDOH) Interventions    Readmission Risk Interventions Readmission Risk Prevention Plan 01/12/2020  Transportation Screening Complete  PCP or Specialist Appt within 3-5 Days Complete  HRI or New Home Complete  Social Work Consult for [place] Planning/Counseling Complete  Palliative Care Screening Not Applicable  Medication Review Press photographer) Complete  Some recent data might be hidden

## 2020-05-06 NOTE — Care Management Important Message (Signed)
Important Message  Patient Details  Name: Daniel Whitney MRN: [id number] Date of Birth: May 05, 1955   Medicare Important Message Given:  Yes - Important Message mailed due to current National Emergency   Verbal consent obtained due to current National Emergency  Relationship to patient: Self Contact Name: Kemon Call Date: 05/06/20  Time: 1041 Phone: [phone] Outcome: Spoke with contact Important Message mailed to: Patient address on file    [name] 05/06/2020, 10:41 AM

## 2020-05-06 NOTE — TOC Progression Note (Signed)
Transition of Care [facility]) - Progression Note    Patient Details  Name: Dasani Speicher MRN: [id number] Date of Birth: [birth year]  Transition of Care [facility]) CM/SW Contact  Sharlet Salina Mila Homer, LCSW Phone Number: 05/06/2020, 6:33 PM  Clinical Narrative: Hassel Neth, admissions director at [facility] regarding patient and was informed that they are still reviewing information and CSW will be contacted. Called admissions director (2:04 pm) and message left. Will follow-up with admissions director on Monday.    Expected Discharge Plan: Skilled Nursing Facility Barriers to Discharge: Insurance Authorization,SNF Pending bed offer  Expected Discharge Plan and Services Expected Discharge Plan: [place] In-house Referral: Clinical Social Work     Living arrangements for the past 2 months: Single Family Home (Condo - 2-level)                                       Social Determinants of Health (SDOH) Interventions    Readmission Risk Interventions Readmission Risk Prevention Plan 01/12/2020  Transportation Screening Complete  PCP or Specialist Appt within 3-5 Days Complete  HRI or [place] Complete  Social Work Consult for [place] Planning/Counseling Complete  Palliative Care Screening Not Applicable  Medication Review Press photographer) Complete  Some recent data might be hidden

## 2020-05-06 NOTE — Progress Notes (Signed)
[facility] Progress Note  Patient: Daniel Whitney    [id number]  DOA: 04/29/2020     Date of Service: the patient was seen and examined on 05/06/2020  Brief hospital course: Past medical history of ESRD on HD MWF, PAF not on any anticoagulation due to SDH history, HTN, HLD, type II DM, OSA not on CPAP, history of nonadherence, recurrent cardiac arrest.  Hyperkalemia from nonadherence to HD.  Presents with complaints of bilateral leg pain. Found to have cellulitis of leg, currently improving.  Currently plan is arrange for safe discharge at [facility]  Medically stable.  Assessment and Plan: 1.  ESRD on HD MWF. Nonadherence to HD Hyperkalemia Volume overload Presents with noncompliance with HD. Underwent urgent HD in the ER. Currently volume status back to normal. Tolerating HD and agreeable to continue with HD for now. Plan is to go to SNF.  2.  Right lower leg cellulitis Chronic venous stasis bilaterally. Doppler negative for DVT. X-ray right knee negative for any acute abnormality. Treated with IV antibiotics We will switch to p.o. for 2 more days.  3.  Right knee pain. Tells me that his muscles are pulling on his knee. We will try Robaxin.  Monitor.  4.  Depression Likely cause of his nonadherence. Patient is on amitriptyline. Currently requesting something to calm his nerves. We will add BuSpar. Denies any suicidal ideation.  5.  HLD Simvastatin  6.  Anemia of chronic kidney disease Hypokalemia Hypercalcemia Hyperphosphatemia Managed by nephrology  7.  OSA Obesity Noncompliant with CPAP Placing the patient at high risk for poor outcome as well as her current volume overload. Body mass index is 32.62 kg/m.   Diet: Renal diet DVT Prophylaxis:   heparin injection 5,000 Units Start: 04/30/20 0015    Advance goals of care discussion: Full code  Family Communication: no family was present at bedside, at the time of interview.   Disposition:  Status is:  Inpatient  Remains inpatient appropriate because:Unsafe d/c plan   Dispo: The patient is from: Home              Anticipated d/c is to: SNF              Patient currently is not medically stable to d/c.   Difficult to place patient    Subjective: No complaint.  No nausea no vomiting but no fever no chills.  Physical Exam: General: Appear in mild distress, no Rash; Oral Mucosa Clear, moist. no Abnormal Neck Mass Or lumps, Conjunctiva normal  Cardiovascular: S1 and S2 Present, no Murmur, Respiratory: good respiratory effort, Bilateral Air entry present and CTA, no Crackles, no wheezes Abdomen: Bowel Sound present, Soft and no tenderness Extremities: Trace pedal edema, 1 open area, chronic venous stasis Neurology: alert and oriented to time, place, and person affect appropriate. no new focal deficit Gait not checked due to patient safety concerns    Vitals:   05/06/20 1600 05/06/20 1616 05/06/20 1619 05/06/20 1703  BP: 134/77 138/82 (!) 144/81 (!) 161/78  Pulse: 67 69 67 73  Resp:   18 18  Temp:   99.2 F (37.3 C) 98.6 F (37 C)  TempSrc:   Oral Oral  SpO2:   99% 100%  Weight:   100.2 kg   Height:        Intake/Output Summary (Last 24 hours) at 05/06/2020 1911 Last data filed at 05/06/2020 1700 Gross per 24 hour  Intake 660 ml  Output 2500 ml  Net -1840 ml  Filed Weights   05/06/20 0500 05/06/20 1315 05/06/20 1619  Weight: 100.2 kg 104.3 kg 100.2 kg    Data Reviewed: I have personally reviewed and interpreted daily labs, tele strips, imaging. I reviewed all nursing notes, pharmacy notes, vitals, pertinent old records I have discussed plan of care as described above with RN and patient/family.  CBC: Recent Labs  Lab 04/30/20 0810 05/05/20 0747 05/06/20 1314  WBC 8.2 5.7 6.0  HGB 10.2* 9.3* 9.4*  HCT 31.5* 29.3* 30.7*  MCV 91.6 94.5 97.2  PLT 196 153 [id number]*   Basic Metabolic Panel: Recent Labs  Lab 04/30/20 1255 05/01/20 0847 05/03/20 1124 05/05/20 0747  05/06/20 1314  NA 135 132* 133* 132* 131*  K 4.2 3.6 3.6 3.8 3.5  CL 94* 93* 94* 95* 94*  CO2 '26 27 29 26 27  '$ GLUCOSE 137* 163* 109* 134* 162*  BUN 45* 26* 24* 40* 22  CREATININE 9.95* 6.48* 6.37* 9.19* 5.90*  CALCIUM 9.5 8.4* 8.5* 8.3* 8.2*  PHOS 7.5*  --   --  5.3* 2.9    Studies: No results found.  Scheduled Meds: . amitriptyline  50 mg Oral QHS  . amLODipine  5 mg Oral QHS  . busPIRone  5 mg Oral BID  . Chlorhexidine Gluconate Cloth  6 each Topical Q0600  . cinacalcet  30 mg Oral Q supper  . darbepoetin (ARANESP) injection - DIALYSIS  100 mcg Intravenous Q Fri-HD  . ferric citrate  420 mg Oral TID WC  . gabapentin  100 mg Oral QHS  . heparin  5,000 Units Subcutaneous Q8H  . hydrocerin   Topical BID  . simvastatin  20 mg Oral q1800   Continuous Infusions: . cefTRIAXone (ROCEPHIN)  IV 2 g (05/05/20 2155)   PRN Meds: acetaminophen **OR** acetaminophen, camphor-menthol, methocarbamol  Time spent: 35 minutes  Author: Berle Mull, MD [facility] 05/06/2020 7:11 PM  To reach On-call, see care teams to locate the attending and reach out via [email or web address]. Between 7PM-7AM, please contact night-coverage If you still have difficulty reaching the attending provider, please page the [facility] (Director on Call) for [facility] on amion for assistance.

## 2020-05-06 NOTE — Progress Notes (Signed)
Occupational Therapy Treatment Patient Details Name: Daniel Whitney MRN: [id number] DOB: [birth year] Today's Date: 05/06/2020    History of present illness Daniel Whitney is a 65 y.o. male admitted on 04/29/20 with ESRD after missing several days of HD and complaints of leg pain. PMH includes paroxysmal atrial fibrillation not on anticoagulation, history of SDH 2018, HTN, HLD, diet-controlled T2DM, anemia of chronic kidney disease, and OSA not on CPAP.   OT comments  Pt progressing well towards OT goals. Provided PROM stretching and manual massage to R posterior knee/thigh to encourage improved knee ROM prior to activities. Pt able to demo short mobility to/from sink via RW at min guard though self TDWB due to difficulty placing R heel to floor. Pt able to demo unsupported ADLs standing at sink with no LOB. Educated pt in further R LE ROM and weightbearing activities to trial. Left heat pack to posterior R knee to improve tissue extensibility with pt to complete further exercises independently. DC recs remain appropriate.   Follow Up Recommendations  SNF    Equipment Recommendations  3 in 1 bedside commode    Recommendations for Other Services      Precautions / Restrictions Precautions Precautions: Fall Precaution Comments: decreased ROM of R knee Restrictions Weight Bearing Restrictions: No       Mobility Bed Mobility               General bed mobility comments: up in recliner    Transfers Overall transfer level: Needs assistance Equipment used: Rolling walker (2 wheeled) Transfers: Sit to/from Stand Sit to Stand: Min guard         General transfer comment: min guard for power up to RW, able to push up from armrests without cues.    Balance Overall balance assessment: Needs assistance Sitting-balance support: No upper extremity supported;Feet supported Sitting balance-Leahy Scale: Good     Standing balance support: Single extremity supported;Bilateral upper  extremity supported;During functional activity Standing balance-Leahy Scale: Fair Standing balance comment: fair static standing without UE support at sink, B UE support needed for mobility                           ADL either performed or assessed with clinical judgement   ADL Overall ADL's : Needs assistance/impaired     Grooming: Min guard;Standing;Oral care Grooming Details (indicate cue type and reason): min guard to brush teeth standing at sink, able to complete bimanual tasks without UE support or LOB. TDWB noted due to R knee pain/decreased ROM                             Functional mobility during ADLs: Min guard;Rolling walker General ADL Comments: improving standing tolerance though still limited by R knee pain with WB, decreased ROM and inability to fully put heel down on floor. Provided PROM stretching and manual massage to posterior knee/thigh prior to activity. Administered heat pack at end of session for posterior knee. Educated that heat will increase tissue extensibility, to stretch and perform LE exercises after administration.     Vision   Vision Assessment?: No apparent visual deficits   Perception     Praxis      Cognition Arousal/Alertness: Awake/alert Behavior During Therapy: WFL for tasks assessed/performed Overall Cognitive Status: Within Functional Limits for tasks assessed Area of Impairment: Memory  General Comments: pt endorses memory difficulties and forgetting to take medication sometimes. aware of deficits and demonstrating need for changes in lifestyle and possible need to move to more accessible living environment        Exercises     Shoulder Instructions       General Comments Pt able to mobilize to/from sink with RW, noted to be self TDWB due to difficulty placing heel to floor. Guided pt in exercises to improve WB, sitting in recliner and leaning forward with pt able to  return demo well.    Pertinent Vitals/ Pain       Pain Assessment: Faces Faces Pain Scale: Hurts little more Pain Location: R knee when weightbearing Pain Descriptors / Indicators: Guarding;Grimacing Pain Intervention(s): Monitored during session;Repositioned  Home Living                                          Prior Functioning/Environment              Frequency  Min 2X/week        Progress Toward Goals  OT Goals(current goals can now be found in the care plan section)  Progress towards OT goals: Progressing toward goals  Acute Rehab OT Goals Patient Stated Goal: be able to walk daughter down the aisle in May OT Goal Formulation: With patient Time For Goal Achievement: 05/17/20 Potential to Achieve Goals: Good ADL Goals Pt Will Perform Grooming: with set-up;standing Pt Will Perform Lower Body Dressing: with min assist;sitting/lateral leans;sit to/from stand;with adaptive equipment Pt Will Transfer to Toilet: with min assist;ambulating Pt Will Perform Toileting - Clothing Manipulation and hygiene: with min assist;sitting/lateral leans;sit to/from stand Pt/caregiver will Perform Home Exercise Program: Increased strength;Both right and left upper extremity;With theraband;Independently;With written HEP provided  Plan Discharge plan remains appropriate    Co-evaluation                 AM-PAC OT "6 Clicks" Daily Activity     Outcome Measure   Help from another person eating meals?: None Help from another person taking care of personal grooming?: A Little Help from another person toileting, which includes using toliet, bedpan, or urinal?: A Little Help from another person bathing (including washing, rinsing, drying)?: A Lot Help from another person to put on and taking off regular upper body clothing?: A Little Help from another person to put on and taking off regular lower body clothing?: A Lot 6 Click Score: 17    End of Session  Equipment Utilized During Treatment: Gait belt;Rolling walker  OT Visit Diagnosis: Unsteadiness on feet (R26.81);Other abnormalities of gait and mobility (R26.89);Muscle weakness (generalized) (M62.81);Pain Pain - Right/Left: Right Pain - part of body: Knee   Activity Tolerance Patient tolerated treatment well   Patient Left in chair;with call bell/phone within reach   Nurse Communication          Time: [id number] OT Time Calculation (min): 36 min  Charges: OT General Charges $OT Visit: 1 Visit OT Treatments $Self Care/Home Management : 8-22 mins $Therapeutic Activity: 8-22 mins  Malachy Chamber, OTR/L Acute Rehab Services Office: [phone]   Layla Maw 05/06/2020, 10:02 AM

## 2020-05-06 NOTE — Progress Notes (Signed)
Physical Therapy Treatment Patient Details Name: Lela Masse MRN: [id number] DOB: 03/28/1955 Today's Date: 05/06/2020    History of Present Illness Davanta Mechling is a 64 y.o. male admitted on 04/29/20 with ESRD after missing several days of HD and complaints of leg pain. PMH includes paroxysmal atrial fibrillation not on anticoagulation, history of SDH 2018, HTN, HLD, diet-controlled T2DM, anemia of chronic kidney disease, and OSA not on CPAP.    PT Comments    Pt received in chair, cooperative and pleasant. Attempted to stand and ambulate, but pt tired from walking in room earlier and reporting increased L LE pain upon standing. Session focused on exercises to promote R knee extension and full R foot contact on ground in standing. Pt responded well to exercises, reporting decreased pain and that he felt more quad contraction. Practiced repeated sit-to-stands. Pt fatigued and needed max A to power up into standing, cueing to rock forward a few times before standing and then extend LEs in standing. Pt left in chair with all needs met and call bell within reach. Will continue to monitor.     Follow Up Recommendations  SNF;Supervision for mobility/OOB     Equipment Recommendations  3in1 (PT)    Recommendations for Other Services       Precautions / Restrictions Precautions Precautions: Fall Precaution Comments: decreased ROM of R knee Restrictions Weight Bearing Restrictions: No    Mobility  Bed Mobility               General bed mobility comments: Pt received sitting in recliner    Transfers Overall transfer level: Needs assistance Equipment used: Rolling walker (2 wheeled) Transfers: Sit to/from Stand Sit to Stand: Max assist         General transfer comment: min guard for power up to RW, able to push up from armrests without cues.  Ambulation/Gait             General Gait Details: unable   Stairs             Wheelchair Mobility    Modified  Rankin (Stroke Patients Only)       Balance Overall balance assessment: Needs assistance Sitting-balance support: No upper extremity supported;Feet supported Sitting balance-Leahy Scale: Good     Standing balance support: Single extremity supported;Bilateral upper extremity supported;During functional activity Standing balance-Leahy Scale: Poor Standing balance comment: Reliant on B UE support                            Cognition Arousal/Alertness: Awake/alert Behavior During Therapy: WFL for tasks assessed/performed Overall Cognitive Status: Within Functional Limits for tasks assessed Area of Impairment: Memory                               General Comments: pt endorses memory difficulties and forgetting to take medication sometimes. aware of deficits and demonstrating need for changes in lifestyle and possible need to move to more accessible living environment      Exercises Other Exercises Other Exercises: Supine quad set, x10 3-5 second hold, on R side Other Exercises: PROM hip internal/external rotation, knee flexion/extension, x10 each on R Other Exercises: Anterior-posterior joint mobilization to address knee extension, patellar mobilization Other Exercises: Repeated sit-to-stands, x7 (difficulty clearing hips, needed some cueing for handplacement but better carryover on additional attempts, cueings to shift weight anteriorly)    General Comments General comments (skin integrity, edema,   etc.):       Pertinent Vitals/Pain Pain Assessment: 0-10 Pain Score: 7  Faces Pain Scale: Hurts little more Pain Location: R knee when weightbearing Pain Descriptors / Indicators: Guarding;Grimacing Pain Intervention(s): Limited activity within patient's tolerance;Monitored during session    Home Living                      Prior Function            PT Goals (current goals can now be found in the care plan section) Acute Rehab PT Goals Patient  Stated Goal: be able to walk daughter down the aisle in May    Frequency    Min 3X/week      PT Plan Current plan remains appropriate    Co-evaluation              AM-PAC PT "6 Clicks" Mobility   Outcome Measure  Help needed turning from your back to your side while in a flat bed without using bedrails?: A Little Help needed moving from lying on your back to sitting on the side of a flat bed without using bedrails?: A Little Help needed moving to and from a bed to a chair (including a wheelchair)?: A Lot Help needed standing up from a chair using your arms (e.g., wheelchair or bedside chair)?: A Lot Help needed to walk in hospital room?: A Lot Help needed climbing 3-5 steps with a railing? : A Lot 6 Click Score: 14    End of Session Equipment Utilized During Treatment: Gait belt Activity Tolerance: Patient limited by fatigue;Patient limited by pain (knee pain) Patient left: with call bell/phone within reach;in chair Nurse Communication: Mobility status PT Visit Diagnosis: Unsteadiness on feet (R26.81);Muscle weakness (generalized) (M62.81)     Time: (P) 1109-(P) 1141 PT Time Calculation (min) (ACUTE ONLY): (P) 32 min  Charges:  $Therapeutic Activity: (P) 23-37 mins           Anne Gallagher, SPT  

## 2020-05-07 DIAGNOSIS — Z992 Dependence on renal dialysis: Secondary | ICD-10-CM | POA: Diagnosis not present

## 2020-05-07 DIAGNOSIS — N186 End stage renal disease: Secondary | ICD-10-CM | POA: Diagnosis not present

## 2020-05-07 MED ORDER — CEPHALEXIN 500 MG PO CAPS
500.0000 mg | ORAL_CAPSULE | Freq: Two times a day (BID) | ORAL | Status: AC
  Administered 2020-05-07 – 2020-05-08 (×4): 500 mg via ORAL
  Filled 2020-05-07 (×4): qty 1

## 2020-05-07 MED ORDER — METHOCARBAMOL 500 MG PO TABS
500.0000 mg | ORAL_TABLET | Freq: Three times a day (TID) | ORAL | Status: DC | PRN
  Administered 2020-05-07 – 2020-05-09 (×3): 500 mg via ORAL
  Filled 2020-05-07 (×3): qty 1

## 2020-05-07 NOTE — Plan of Care (Signed)
  Problem: Fluid Volume: Goal: Compliance with measures to maintain balanced fluid volume will improve Outcome: Progressing   Problem: Nutritional: Goal: Ability to make healthy dietary choices will improve Outcome: Progressing   Problem: Education: Goal: Knowledge of General Education information will improve Description: Including pain rating scale, medication(s)/side effects and non-pharmacologic comfort measures Outcome: Progressing   Problem: Activity: Goal: Risk for activity intolerance will decrease Outcome: Progressing   Problem: Nutrition: Goal: Adequate nutrition will be maintained Outcome: Progressing   Problem: Safety: Goal: Ability to remain free from injury will improve Outcome: Progressing   Problem: Skin Integrity: Goal: Risk for impaired skin integrity will decrease Outcome: Progressing

## 2020-05-07 NOTE — Progress Notes (Signed)
Subjective:  Had short dialysis yesterday. Tolerated well -net UF 2.5L No new complaints. Itching better     Objective Vital signs in last 24 hours: Vitals:   05/06/20 2147 05/06/20 2241 05/07/20 0500 05/07/20 0800  BP: 132/85 (!) 137/93 129/76 121/69  Pulse: 77 69 80 64  Resp: '16 18 18   '$ Temp: 99.9 F (37.7 C) 98.9 F (37.2 C) 98 F (36.7 C) 98.1 F (36.7 C)  TempSrc: Oral Oral  Oral  SpO2: 94% 95% 97% 97%  Weight:      Height:        Physical Exam: General:Alert, sitting up at bedside  Heart:Irregular, no MRG Lungs:Clear bilaterally , nonlabored breathing Abdomen:Soft non-tender  Extremities: Trace edema +venous stasis skin changes, erosions from scratching  Access:LUE AVF +bruit   Dialysis Orders: [facility] MWF EDW 104.5 4 hr 15 min  F180 dialyzer BFR 450 DFR A1.5 2K/ 3.5 Ca bath AVF Heparin 5000 u bolus venofer 50 mg weekly mircera 200 mcg q 2 weeks, last given 04/04/20  Problem/Plan: 1Volume overload with nonadherence to hemodialysis txs : Improving with serial HD here. Now below outpatient dry weight. Continue UF as tolerated   2 ESRD: HD MWF- Back on schedule. Next HD 3/28   3. Hyperkalemia(2/2 missed HD ) :Resolved with HD.   4. LE wounds:Consistent with cellulitis/venous stasis . Antibiotics per primary - on Rocephin. Doppler study negative for DVT.   5Hypertension:Blood pressures and volume status improving with HD.  On amlodipine '5mg'$ . Will have lower EDW at discharge ~~ 100.0 kg. Post HD wt 102. Kg on 3/24.   6. Anemia of ESRD:Hgb 10.2 >9.3.  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  Depression/ho ETOH:  Per primary  9. Dispo:  awaiting SNF placement    Lynnda Child PA-C [facility] 05/07/2020,10:54 AM    Labs: Basic Metabolic Panel: Recent Labs  Lab 04/30/20 1255 05/01/20 0847 05/03/20 1124 05/05/20 0747 05/06/20 1314  NA 135   < > 133*  132* 131*  K 4.2   < > 3.6 3.8 3.5  CL 94*   < > 94* 95* 94*  CO2 26   < > '29 26 27  '$ GLUCOSE 137*   < > 109* 134* 162*  BUN 45*   < > 24* 40* 22  CREATININE 9.95*   < > 6.37* 9.19* 5.90*  CALCIUM 9.5   < > 8.5* 8.3* 8.2*  PHOS 7.5*  --   --  5.3* 2.9   < > = values in this interval not displayed.   Liver Function Tests: Recent Labs  Lab 04/30/20 1255 05/05/20 0747 05/06/20 1314  ALBUMIN 2.8* 2.7* 2.7*   No results for input(s): LIPASE, AMYLASE in the last 168 hours. No results for input(s): AMMONIA in the last 168 hours. CBC: Recent Labs  Lab 05/05/20 0747 05/06/20 1314  WBC 5.7 6.0  HGB 9.3* 9.4*  HCT 29.3* 30.7*  MCV 94.5 97.2  PLT 153 123*   Cardiac Enzymes: No results for input(s): CKTOTAL, CKMB, CKMBINDEX, TROPONINI in the last 168 hours. CBG: Recent Labs  Lab 05/02/20 0726  GLUCAP 115*    Studies/Results: No results found. Medications:  . amitriptyline  50 mg Oral QHS  . amLODipine  5 mg Oral QHS  . busPIRone  5 mg Oral BID  . Chlorhexidine Gluconate Cloth  6 each Topical Q0600  . cinacalcet  30 mg Oral Q supper  .  darbepoetin (ARANESP) injection - DIALYSIS  100 mcg Intravenous Q Fri-HD  . ferric citrate  420 mg Oral TID WC  . gabapentin  100 mg Oral QHS  . heparin  5,000 Units Subcutaneous Q8H  . hydrocerin   Topical BID  . simvastatin  20 mg Oral q1800

## 2020-05-07 NOTE — Progress Notes (Signed)
[facility] PROGRESS NOTE  Patient: Daniel Whitney [id number]   PCP: [name], MD DOB: 09-02-1955   DOA: 04/29/2020   DOS: 05/07/2020    Subjective: No acute complaint.  No acute events overnight.  No further bleeding or open sores.  Objective:  Vitals:   05/07/20 1151 05/07/20 1656  BP: 134/88 133/68  Pulse: 62 62  Resp: 17 16  Temp:  98.5 F (36.9 C)  SpO2: 100% 100%    S1-S2 present. Bilateral lower extremity edema.  Assessment and plan: Medically stable for discharge to SNF. Discussed with Education officer, museum, facility will not take patients over the weekend. Currently no insurance authorization also.  Author: Berle Mull, MD [facility] 05/07/2020 7:43 PM   If 7PM-7AM, please contact night-coverage at [email or web address]

## 2020-05-08 DIAGNOSIS — N186 End stage renal disease: Secondary | ICD-10-CM | POA: Diagnosis not present

## 2020-05-08 DIAGNOSIS — Z992 Dependence on renal dialysis: Secondary | ICD-10-CM | POA: Diagnosis not present

## 2020-05-08 NOTE — Progress Notes (Addendum)
[facility] PROGRESS NOTE  Patient: Daniel Whitney [id number]   PCP: [name], MD DOB: May 10, 1955   DOA: 04/29/2020   DOS: 05/08/2020    Subjective: No acute complaint.  No nausea no vomiting no fever no chills.  Objective:  Vitals:   05/08/20 0500 05/08/20 0948  BP: 108/67 134/81  Pulse: 60 63  Resp: 18 18  Temp: 98 F (36.7 C) 97.7 F (36.5 C)  SpO2: 96% 100%    S1-S2 present. Clear to auscultation. Bilateral lower extremity edema is with chronic venous stasis.  No further bleeding.  Assessment and plan: Medically ready for discharge. We will discuss with social worker tomorrow regarding status.  No Covid test ordered given lack of any information regarding insurance authorization or bed availability.  Author: Berle Mull, MD [facility] 05/08/2020 6:24 PM   If 7PM-7AM, please contact night-coverage at [email or web address]

## 2020-05-08 NOTE — Plan of Care (Signed)
  Problem: Fluid Volume: Goal: Compliance with measures to maintain balanced fluid volume will improve Outcome: Progressing   Problem: Health Behavior/Discharge Planning: Goal: Ability to manage health-related needs will improve Outcome: Progressing   Problem: Clinical Measurements: Goal: Complications related to the disease process, condition or treatment will be avoided or minimized Outcome: Progressing   Problem: Education: Goal: Knowledge of General Education information will improve Description: Including pain rating scale, medication(s)/side effects and non-pharmacologic comfort measures Outcome: Progressing   Problem: Health Behavior/Discharge Planning: Goal: Ability to manage health-related needs will improve Outcome: Progressing   Problem: Clinical Measurements: Goal: Ability to maintain clinical measurements within normal limits will improve Outcome: Progressing   Problem: Activity: Goal: Risk for activity intolerance will decrease Outcome: Progressing   Problem: Nutrition: Goal: Adequate nutrition will be maintained Outcome: Progressing   Problem: Coping: Goal: Level of anxiety will decrease Outcome: Progressing   Problem: Elimination: Goal: Will not experience complications related to bowel motility Outcome: Progressing   Problem: Pain Managment: Goal: General experience of comfort will improve Outcome: Progressing

## 2020-05-08 NOTE — Progress Notes (Signed)
Subjective:  Seen in room. No new complaints. Some itching, but improving  No cp, sob.    Objective Vital signs in last 24 hours: Vitals:   05/07/20 1656 05/07/20 2005 05/08/20 0500 05/08/20 0948  BP: 133/68 128/69 108/67 134/81  Pulse: 62 67 60 63  Resp: '16 18 18 18  '$ Temp: 98.5 F (36.9 C) 99.2 F (37.3 C) 98 F (36.7 C) 97.7 F (36.5 C)  TempSrc: Oral Oral Oral Oral  SpO2: 100% 98% 96% 100%  Weight:   100 kg   Height:        Physical Exam: General:Alert, sitting up at bedside  Heart:Irregular, no MRG Lungs:Clear bilaterally , nonlabored breathing Abdomen:Soft non-tender  Extremities: Trace edema +venous stasis skin changes, erosions from scratching  Access:LUE AVF +bruit   Dialysis Orders: [facility] MWF EDW 104.5 4 hr 15 min  F180 dialyzer BFR 450 DFR A1.5 2K/ 3.5 Ca bath AVF Heparin 5000 u bolus venofer 50 mg weekly mircera 200 mcg q 2 weeks, last given 04/04/20  Problem/Plan: 1Volume overload with nonadherence to hemodialysis txs : Improving with serial HD here. Now below outpatient dry weight. Continue UF as tolerated   2 ESRD: HD MWF- Back on schedule. Next HD 3/28   3. Hyperkalemia(2/2 missed HD ) :Resolved with HD.   4. LE wounds:Consistent with cellulitis/venous stasis . Antibiotics per primary - on Rocephin. Doppler study negative for DVT.   5Hypertension:Blood pressures and volume status improving with HD.  On amlodipine '5mg'$ . Will have lower EDW at discharge ~~ 100.0 kg. Post HD wt 102. Kg on 3/24.   6. Anemia of ESRD:Hgb 10.2 >9.3.  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  Depression/ho ETOH:  Per primary  9. Dispo:  awaiting SNF placement    Lynnda Child PA-C [facility] 05/08/2020,10:44 AM    Labs: Basic Metabolic Panel: Recent Labs  Lab 05/03/20 1124 05/05/20 0747 05/06/20 1314  NA 133* 132* 131*  K 3.6 3.8 3.5  CL 94* 95* 94*  CO2  '29 26 27  '$ GLUCOSE 109* 134* 162*  BUN 24* 40* 22  CREATININE 6.37* 9.19* 5.90*  CALCIUM 8.5* 8.3* 8.2*  PHOS  --  5.3* 2.9   Liver Function Tests: Recent Labs  Lab 05/05/20 0747 05/06/20 1314  ALBUMIN 2.7* 2.7*   No results for input(s): LIPASE, AMYLASE in the last 168 hours. No results for input(s): AMMONIA in the last 168 hours. CBC: Recent Labs  Lab 05/05/20 0747 05/06/20 1314  WBC 5.7 6.0  HGB 9.3* 9.4*  HCT 29.3* 30.7*  MCV 94.5 97.2  PLT 153 123*   Cardiac Enzymes: No results for input(s): CKTOTAL, CKMB, CKMBINDEX, TROPONINI in the last 168 hours. CBG: Recent Labs  Lab 05/02/20 0726  GLUCAP 115*    Studies/Results: No results found. Medications:  . amitriptyline  50 mg Oral QHS  . amLODipine  5 mg Oral QHS  . busPIRone  5 mg Oral BID  . cephALEXin  500 mg Oral Q12H  . Chlorhexidine Gluconate Cloth  6 each Topical Q0600  . cinacalcet  30 mg Oral Q supper  . darbepoetin (ARANESP) injection - DIALYSIS  100 mcg Intravenous Q Fri-HD  . ferric citrate  420 mg Oral TID WC  . gabapentin  100 mg Oral QHS  . heparin  5,000 Units Subcutaneous Q8H  . hydrocerin   Topical BID  . simvastatin  20 mg Oral q1800

## 2020-05-09 DIAGNOSIS — N186 End stage renal disease: Secondary | ICD-10-CM | POA: Diagnosis not present

## 2020-05-09 DIAGNOSIS — Z992 Dependence on renal dialysis: Secondary | ICD-10-CM | POA: Diagnosis not present

## 2020-05-09 LAB — RENAL FUNCTION PANEL
Albumin: 2.8 g/dL — ABNORMAL LOW (ref 3.5–5.0)
Anion gap: 12 (ref 5–15)
BUN: 36 mg/dL — ABNORMAL HIGH (ref 8–23)
CO2: 25 mmol/L (ref 22–32)
Calcium: 8.3 mg/dL — ABNORMAL LOW (ref 8.9–10.3)
Chloride: 92 mmol/L — ABNORMAL LOW (ref 98–111)
Creatinine, Ser: 8.73 mg/dL — ABNORMAL HIGH (ref 0.61–1.24)
GFR, Estimated: 6 mL/min — ABNORMAL LOW (ref >60)
Glucose, Bld: 111 mg/dL — ABNORMAL HIGH (ref 70–99)
Phosphorus: 4.4 mg/dL (ref 2.5–4.6)
Potassium: 3.7 mmol/L (ref 3.5–5.1)
Sodium: 129 mmol/L — ABNORMAL LOW (ref 135–145)

## 2020-05-09 LAB — CBC
HCT: 31.1 % — ABNORMAL LOW (ref 39.0–52.0)
Hemoglobin: 9.9 g/dL — ABNORMAL LOW (ref 13.0–17.0)
MCH: 30.4 pg (ref 26.0–34.0)
MCHC: 31.8 g/dL (ref 30.0–36.0)
MCV: 95.4 fL (ref 80.0–100.0)
Platelets: 115 10*3/uL — ABNORMAL LOW (ref 150–400)
RBC: 3.26 MIL/uL — ABNORMAL LOW (ref 4.22–5.81)
RDW: 18.4 % — ABNORMAL HIGH (ref 11.5–15.5)
WBC: 5.9 10*3/uL (ref 4.0–10.5)
nRBC: 0 % (ref 0.0–0.2)

## 2020-05-09 NOTE — Progress Notes (Addendum)
Subjective: For  hemodialysis today,, no current complaints  Objective Vital signs in last 24 hours: Vitals:   05/08/20 0948 05/08/20 1700 05/08/20 2132 05/09/20 0913  BP: 134/81 128/76 124/75 116/70  Pulse: 63 71 70 (!) 57  Resp: '18 18 18 17  '$ Temp: 97.7 F (36.5 C) 97.9 F (36.6 C) 98.6 F (37 C)   TempSrc: Oral Oral Oral   SpO2: 100% 100% 97% 100%  Weight:      Height:       Weight change:   Physical Exam: General:Alert, male, NAD  Heart:RRR, no MRG Lungs:CTA  , nonlabored breathing Abdomen:Soft, BS positive, obese NTND  Extremities: Trace bipedal edema , Ace wrap dressing lower leg dry, clean Access:LUE AVF +bruit   Dialysis Orders: [facility] MWF EDW 104.5 4 hr 15 min  F180 dialyzer BFR 450 DFR A1.5 2K/ 3.5 Ca bath AVF Heparin 5000 u bolus venofer 50 mg weekly mircera 200 mcg q 2 weeks, last given 04/04/20  Problem/Plan: 1Volume overload with nonadherence to hemodialysis txs , resolved with serial HD here. Now below OP  dry weight. Continue UF as tolerated   2 ESRD: HD MWF-  on schedule now  3. Hyperkalemia(2/2 missed HD ) :Resolved with HD.   4. LE wounds:Consistent with cellulitis/venous stasis . Antibiotics per primary - on Rocephin. Doppler study negative for DVT.   5Hypertension:Since admit BP and volume status improving with HD.  On amlodipine '5mg'$ .  At bedtime.  Will havelower EDW at discharge ~~ 100.0 kg.  Follow-up weights BP after dialysis  6.  Anemia of ESRD= Hgb 9.9 given Aranesp 100 on 3/25, follow-up Hgb trend  7. 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  Depression/anxiety/history of EtOH= on meds per admit  9.  Disposition =awaiting SNF placement   Ernest Haber, PA-C [facility] [phone] 05/09/2020,10:21 AM  LOS: 9 days   Labs: Basic Metabolic Panel: Recent Labs  Lab 05/05/20 0747 05/06/20 1314 05/09/20 0829  NA 132* 131* 129*  K 3.8 3.5 3.7  CL 95* 94* 92*  CO2 '26  27 25  '$ GLUCOSE 134* 162* 111*  BUN 40* 22 36*  CREATININE 9.19* 5.90* 8.73*  CALCIUM 8.3* 8.2* 8.3*  PHOS 5.3* 2.9 4.4   Liver Function Tests: Recent Labs  Lab 05/05/20 0747 05/06/20 1314 05/09/20 0829  ALBUMIN 2.7* 2.7* 2.8*   No results for input(s): LIPASE, AMYLASE in the last 168 hours. No results for input(s): AMMONIA in the last 168 hours. CBC: Recent Labs  Lab 05/05/20 0747 05/06/20 1314 05/09/20 0829  WBC 5.7 6.0 5.9  HGB 9.3* 9.4* 9.9*  HCT 29.3* 30.7* 31.1*  MCV 94.5 97.2 95.4  PLT 153 123* 115*   Cardiac Enzymes: No results for input(s): CKTOTAL, CKMB, CKMBINDEX, TROPONINI in the last 168 hours. CBG: No results for input(s): GLUCAP in the last 168 hours.  Studies/Results: No results found. Medications:  . amitriptyline  50 mg Oral QHS  . amLODipine  5 mg Oral QHS  . busPIRone  5 mg Oral BID  . Chlorhexidine Gluconate Cloth  6 each Topical Q0600  . cinacalcet  30 mg Oral Q supper  . darbepoetin (ARANESP) injection - DIALYSIS  100 mcg Intravenous Q Fri-HD  . ferric citrate  420 mg Oral TID WC  . gabapentin  100 mg Oral QHS  . heparin  5,000 Units Subcutaneous Q8H  . hydrocerin   Topical BID  . simvastatin  20 mg Oral q1800

## 2020-05-09 NOTE — Plan of Care (Signed)
  Problem: Fluid Volume: Goal: Compliance with measures to maintain balanced fluid volume will improve Outcome: Progressing   Problem: Clinical Measurements: Goal: Respiratory complications will improve Outcome: Progressing   Problem: Pain Managment: Goal: General experience of comfort will improve Outcome: Progressing

## 2020-05-09 NOTE — Progress Notes (Signed)
[facility] Progress Note  Patient: Daniel Whitney    [id number]  DOA: 04/29/2020     Date of Service: the patient was seen and examined on 05/09/2020  Brief hospital course: Past medical history of ESRD on HD MWF, PAF not on any anticoagulation due to SDH history, HTN, HLD, type II DM, OSA not on CPAP, history of nonadherence, recurrent cardiac arrest.  Hyperkalemia from nonadherence to HD.  Presents with complaints of bilateral leg pain. Found to have cellulitis of leg, currently improving.  Currently plan is arrange for safe discharge at [facility].  Medically stable.  Assessment and Plan: 1.  ESRD on HD MWF. Nonadherence to HD Hyperkalemia Volume overload Presents with noncompliance with HD. Underwent urgent HD in the ER. Currently volume status back to normal. Tolerating HD and agreeable to continue with HD for now. Plan is to go to SNF.  2.  Right lower leg cellulitis Chronic venous stasis bilaterally. Doppler negative for DVT. X-ray right knee negative for any acute abnormality. Treated with IV antibiotics Therapy completed.  3.  Right knee pain. Tells me that his muscles are pulling on his knee. Treated with Robaxin.   4.  Depression Likely cause of his nonadherence. Patient is on amitriptyline. Currently requesting something to calm his nerves. Continue BuSpar. Denies any suicidal ideation.  5.  HLD Simvastatin  6.  Anemia of chronic kidney disease Hypokalemia Hypercalcemia Hyperphosphatemia Managed by nephrology  7.  OSA Obesity Noncompliant with CPAP Placing the patient at high risk for poor outcome as well as her current volume overload. Body mass index is 32.56 kg/m.   Diet: Renal diet DVT Prophylaxis:   heparin injection 5,000 Units Start: 04/30/20 0015    Advance goals of care discussion: Full code  Family Communication: no family was present at bedside, at the time of interview.   Disposition:  Status is: Inpatient  Remains inpatient  appropriate because:Unsafe d/c plan   Dispo: The patient is from: Home              Anticipated d/c is to: SNF              Patient currently is not medically stable to d/c.   Difficult to place patient    Subjective: No nausea no vomiting.  No fever no chills.  Physical Exam: General: Appear in mild distress, no Rash; Oral Mucosa Clear, moist. no Abnormal Neck Mass Or lumps, Conjunctiva normal  Cardiovascular: S1 and S2 Present, no Murmur, Respiratory: good respiratory effort, Bilateral Air entry present and CTA, no Crackles, no wheezes Abdomen: Bowel Sound present, Soft and no tenderness Extremities: Bilateral pedal edema Neurology: alert and oriented to time, place, and person affect appropriate. no new focal deficit Gait not checked due to patient safety concerns    Vitals:   05/08/20 1700 05/08/20 2132 05/09/20 0913 05/09/20 1647  BP: 128/76 124/75 116/70 129/83  Pulse: 71 70 (!) 57 63  Resp: '18 18 17 17  '$ Temp: 97.9 F (36.6 C) 98.6 F (37 C)  97.6 F (36.4 C)  TempSrc: Oral Oral  Oral  SpO2: 100% 97% 100% 91%  Weight:      Height:        Intake/Output Summary (Last 24 hours) at 05/09/2020 1921 Last data filed at 05/09/2020 1300 Gross per 24 hour  Intake 600 ml  Output 0 ml  Net 600 ml   Filed Weights   05/06/20 1315 05/06/20 1619 05/08/20 0500  Weight: 104.3 kg 100.2 kg 100 kg  Data Reviewed: I have personally reviewed and interpreted daily labs, tele strips, imaging. I reviewed all nursing notes, pharmacy notes, vitals, pertinent old records I have discussed plan of care as described above with RN and patient/family.  CBC: Recent Labs  Lab 05/05/20 0747 05/06/20 1314 05/09/20 0829  WBC 5.7 6.0 5.9  HGB 9.3* 9.4* 9.9*  HCT 29.3* 30.7* 31.1*  MCV 94.5 97.2 95.4  PLT 153 123* [id number]   Basic Metabolic Panel: Recent Labs  Lab 05/03/20 1124 05/05/20 0747 05/06/20 1314 05/09/20 0829  NA 133* 132* 131* 129*  K 3.6 3.8 3.5 3.7  CL 94* 95* 94* 92*   CO2 '29 26 27 25  '$ GLUCOSE 109* 134* 162* 111*  BUN 24* 40* 22 36*  CREATININE 6.37* 9.19* 5.90* 8.73*  CALCIUM 8.5* 8.3* 8.2* 8.3*  PHOS  --  5.3* 2.9 4.4    Studies: No results found.  Scheduled Meds: . amitriptyline  50 mg Oral QHS  . amLODipine  5 mg Oral QHS  . busPIRone  5 mg Oral BID  . Chlorhexidine Gluconate Cloth  6 each Topical Q0600  . cinacalcet  30 mg Oral Q supper  . darbepoetin (ARANESP) injection - DIALYSIS  100 mcg Intravenous Q Fri-HD  . ferric citrate  420 mg Oral TID WC  . gabapentin  100 mg Oral QHS  . heparin  5,000 Units Subcutaneous Q8H  . hydrocerin   Topical BID  . simvastatin  20 mg Oral q1800   Continuous Infusions:  PRN Meds: acetaminophen **OR** acetaminophen, camphor-menthol, methocarbamol  Time spent: 35 minutes  Author: Berle Mull, MD [facility] 05/09/2020 7:21 PM  To reach On-call, see care teams to locate the attending and reach out via [email or web address]. Between 7PM-7AM, please contact night-coverage If you still have difficulty reaching the attending provider, please page the [facility] (Director on Call) for [facility] on amion for assistance.

## 2020-05-09 NOTE — Progress Notes (Signed)
Occupational Therapy Treatment Patient Details Name: Daniel Whitney MRN: [id number] DOB: [birth year] Today's Date: 05/09/2020    History of present illness Daniel Whitney is a 65 y.o. male admitted on 04/29/20 with ESRD after missing several days of HD and complaints of leg pain. PMH includes paroxysmal atrial fibrillation not on anticoagulation, history of SDH 2018, HTN, HLD, diet-controlled T2DM, anemia of chronic kidney disease, and OSA not on CPAP.   OT comments  Pt progressing, but unable to properly care for self and still with increased time and assist for ADL and mobility. Pt's RLE strength improving as pt has decreased activity tolerance and difficulty with wt bearing due to pain on  RLE. Pt education on stretching of RLE pre and post session. Pt ambulatory in room with minguardA 15' with RW. Pt with pain and decreased ability to weight bear more than toes of RLE. Pt set-upA for grooming at EOB, otherwise maxA for toilet hygiene and LB ADL. Pt's memory appears to be improving/carry over of skills from session to session. Pt ery motivated to return tio PLOF. Pt would benefit from continued OT skilled services. OT following acutely.    Follow Up Recommendations  SNF    Equipment Recommendations  3 in 1 bedside commode    Recommendations for Other Services      Precautions / Restrictions Precautions Precautions: Fall Precaution Comments: decreased ROM of R knee Restrictions Weight Bearing Restrictions: No       Mobility Bed Mobility Overal bed mobility: Needs Assistance Bed Mobility: Rolling;Sidelying to Sit Rolling: Supervision Sidelying to sit: HOB elevated;Supervision       General bed mobility comments: at EOB for PT arrival    Transfers Overall transfer level: Needs assistance Equipment used: Rolling walker (2 wheeled) Transfers: Sit to/from Stand Sit to Stand: Min guard         General transfer comment: min guard for power up to RW, able to push up from bed     Balance Overall balance assessment: Needs assistance Sitting-balance support: No upper extremity supported;Feet supported Sitting balance-Leahy Scale: Good       Standing balance-Leahy Scale: Poor Standing balance comment: Reliant on B UE support                           ADL either performed or assessed with clinical judgement   ADL Overall ADL's : Needs assistance/impaired     Grooming: Set up;Sitting Grooming Details (indicate cue type and reason): pt wanting to sit for task as standing was going to be difficult. Upper Body Bathing: Set up;Sitting           Lower Body Dressing: Maximal assistance;Sit to/from stand Lower Body Dressing Details (indicate cue type and reason): simulating donning pants     Toileting- Clothing Manipulation and Hygiene: Maximal assistance;Sit to/from stand Toileting - Clothing Manipulation Details (indicate cue type and reason): requiring assist for wiping as chuck pad was dirty.     Functional mobility during ADLs: Min guard;Rolling walker General ADL Comments: Pt's RLE strength improving as pt has decreased activity tolerance and difficulty with wt bearing due to pain on  RLE. Pt education on stretching of RLE pre and post session. Pt ambulatory in room with minguardA 15' with RW. Pt with pain and decreased ability to weight bear more than toes. Pt set-upA for grooming at EOB.     Vision   Vision Assessment?: No apparent visual deficits   Perception     Praxis  Cognition Arousal/Alertness: Awake/alert Behavior During Therapy: WFL for tasks assessed/performed Overall Cognitive Status: Within Functional Limits for tasks assessed Area of Impairment: Memory                     Memory: Decreased short-term memory         General Comments: pt able to recall earlier OT session about healthy strategies.        Exercises     Shoulder Instructions       General Comments      Pertinent Vitals/ Pain        Pain Assessment: Faces Faces Pain Scale: Hurts little more Pain Location: R knee when weightbearing Pain Descriptors / Indicators: Guarding;Grimacing;Sore Pain Intervention(s): Monitored during session  Home Living                                          Prior Functioning/Environment              Frequency  Min 2X/week        Progress Toward Goals  OT Goals(current goals can now be found in the care plan section)  Progress towards OT goals: Progressing toward goals  Acute Rehab OT Goals Patient Stated Goal: be able to walk daughter down the aisle in May OT Goal Formulation: With patient Time For Goal Achievement: 05/17/20 Potential to Achieve Goals: Good ADL Goals Pt Will Perform Grooming: with set-up;standing Pt Will Perform Lower Body Dressing: with min assist;sitting/lateral leans;sit to/from stand;with adaptive equipment Pt Will Transfer to Toilet: with min assist;ambulating Pt Will Perform Toileting - Clothing Manipulation and hygiene: with min assist;sitting/lateral leans;sit to/from stand Pt/caregiver will Perform Home Exercise Program: Increased strength;Both right and left upper extremity;With theraband;Independently;With written HEP provided  Plan Discharge plan remains appropriate    Co-evaluation                 AM-PAC OT "6 Clicks" Daily Activity     Outcome Measure   Help from another person eating meals?: None Help from another person taking care of personal grooming?: A Little Help from another person toileting, which includes using toliet, bedpan, or urinal?: A Little Help from another person bathing (including washing, rinsing, drying)?: A Lot Help from another person to put on and taking off regular upper body clothing?: A Little Help from another person to put on and taking off regular lower body clothing?: A Lot 6 Click Score: 17    End of Session Equipment Utilized During Treatment: Gait belt;Rolling walker  OT  Visit Diagnosis: Unsteadiness on feet (R26.81);Other abnormalities of gait and mobility (R26.89);Muscle weakness (generalized) (M62.81);Pain Pain - Right/Left: Right Pain - part of body: Knee   Activity Tolerance Patient tolerated treatment well   Patient Left in bed;with call bell/phone within reach;Other (comment) (PT walking in to work with pt)   Nurse Communication Mobility status        Time: [id number] OT Time Calculation (min): 22 min  Charges: OT General Charges $OT Visit: 1 Visit OT Treatments $Self Care/Home Management : 8-22 mins  Jefferey Pica, OTR/L Acute Rehabilitation Services Pager: [phone] Office: [phone]    Keitra Carusone C 05/09/2020, 2:38 PM

## 2020-05-09 NOTE — TOC Progression Note (Addendum)
Transition of Care [facility]) - Progression Note    Patient Details  Name: Daniel Whitney MRN: [id number] Date of Birth: [birth year]  [facility] of [facility] [place]) CM/SW Contact  Sharlet Salina Mila Homer, LCSW Phone Number: 05/09/2020, 1:39 PM  Clinical Narrative:  CSW advised by admissions director Resheema with [facility] that they can take patient. CSW talked with [facility] and confirmed that they don't manage him and Resheema advised.  She will submit clinicals to [facility] today.  Call made to PT office and spoke with Jarrett Soho regarding PT and OT seeing patient. CSW informed that PT will see patient today and request made for OT to see patient.   Both PT/OT were able to see patient today and clinicals transmitted to [facility].   Expected Discharge Plan: Skilled Nursing Facility Barriers to Discharge: Insurance Authorization,SNF Pending bed offer  Expected Discharge Plan and Services Expected Discharge Plan: [place] In-house Referral: Clinical Social Work     Living arrangements for the past 2 months: Single Family Home (Condo - 2-level)                                     Social Determinants of Health (SDOH) Interventions  None requested or needed    Readmission Risk Interventions Readmission Risk Prevention Plan 01/12/2020  Transportation Screening Complete  PCP or Specialist Appt within 3-5 Days Complete  HRI or [place] Complete  Social Work Consult for [place] Planning/Counseling Complete  Palliative Care Screening Not Applicable  Medication Review [employer]) Complete  Some recent data might be hidden

## 2020-05-09 NOTE — Progress Notes (Signed)
Durward Mallard, RN called and notified the pt's HD tx has been moved to 05/10/2020.

## 2020-05-09 NOTE — Progress Notes (Signed)
Physical Therapy Treatment Patient Details Name: Daniel Whitney MRN: [id number] DOB: 1956-01-06 Today's Date: 05/09/2020    History of Present Illness Daniel Whitney is a 65 y.o. male admitted on 04/29/20 with ESRD after missing several days of HD and complaints of leg pain. PMH includes paroxysmal atrial fibrillation not on anticoagulation, history of SDH 2018, HTN, HLD, diet-controlled T2DM, anemia of chronic kidney disease, and OSA not on CPAP.    PT Comments    Continuing work on functional mobility and activity tolerance;  Session focused on transfer training and therapeutic exercises aimed at knee extension/hamstring stretching for better standing tolerance, gait symmetry; Showing good understanding of need for hamstring stretch and knee extension; Improving transfers and able to initiate initial gait and steps; Overall progressing well; Anticipate continuing good progress at post-acute rehabilitation.    Follow Up Recommendations  SNF;Supervision for mobility/OOB     Equipment Recommendations  3in1 (PT)    Recommendations for Other Services       Precautions / Restrictions Precautions Precautions: Fall Precaution Comments: decreased ROM of R knee Restrictions Weight Bearing Restrictions: No    Mobility  Bed Mobility Overal bed mobility: Needs Assistance Bed Mobility: Sit to Supine Rolling: Supervision Sidelying to sit: HOB elevated;Supervision   Sit to supine: Supervision   General bed mobility comments: Recieved sitting EOB; Got back to supine after transfer training and therex    Transfers Overall transfer level: Needs assistance Equipment used: Rolling walker (2 wheeled) Transfers: Sit to/from Stand Sit to Stand: Min guard         General transfer comment: min guard for power up to RW, able to push up from bed  Ambulation/Gait Ambulation/Gait assistance: Min assist Gait Distance (Feet): 2 Feet (including pivotal steps bed to recliner and tehn back to  bed) Assistive device: Rolling walker (2 wheeled) Gait Pattern/deviations: Decreased step length - right;Decreased step length - left;Shuffle;Decreased stance time - right     General Gait Details: R knee in semi flexed position in standing, unable to accept much weight on RLE in stance, leading to heavy footfall LLE; inefficent, asymmetric gait   Stairs             Wheelchair Mobility    Modified Rankin (Stroke Patients Only)       Balance Overall balance assessment: Needs assistance Sitting-balance support: No upper extremity supported;Feet supported Sitting balance-Leahy Scale: Good       Standing balance-Leahy Scale: Poor Standing balance comment: Reliant on B UE support                            Cognition Arousal/Alertness: Awake/alert Behavior During Therapy: WFL for tasks assessed/performed Overall Cognitive Status: Within Functional Limits for tasks assessed Area of Impairment: Memory                     Memory: Decreased short-term memory         General Comments: pt able to recall earlier OT session about healthy strategies.      Exercises Other Exercises Other Exercises: Supine quad set, x10 3-5 second hold, on R side Other Exercises: Seated hamstring stretch R and L with LE supported on chair in front of pt; multimodal cueing for form, including focus on anterior pelvic tilt Other Exercises: Attempted supine hamstring stretching, but difficulty keeping correct form    General Comments        Pertinent Vitals/Pain Pain Assessment: Faces Faces Pain Scale: Hurts little  more Pain Location: R knee when weightbearing Pain Descriptors / Indicators: Guarding;Grimacing;Sore Pain Intervention(s): Monitored during session    Home Living                      Prior Function            PT Goals (current goals can now be found in the care plan section) Acute Rehab PT Goals Patient Stated Goal: be able to walk daughter  down the aisle in May PT Goal Formulation: With patient Time For Goal Achievement: 05/17/20 Potential to Achieve Goals: Good Progress towards PT goals: Progressing toward goals    Frequency    Min 3X/week      PT Plan Current plan remains appropriate    Co-evaluation              AM-PAC PT "6 Clicks" Mobility   Outcome Measure  Help needed turning from your back to your side while in a flat bed without using bedrails?: A Little Help needed moving from lying on your back to sitting on the side of a flat bed without using bedrails?: A Little Help needed moving to and from a bed to a chair (including a wheelchair)?: A Lot Help needed standing up from a chair using your arms (e.g., wheelchair or bedside chair)?: A Lot Help needed to walk in hospital room?: A Lot Help needed climbing 3-5 steps with a railing? : A Lot 6 Click Score: 14    End of Session Equipment Utilized During Treatment: Gait belt Activity Tolerance: Patient tolerated treatment well Patient left: in bed;with call bell/phone within reach Nurse Communication: Mobility status PT Visit Diagnosis: Unsteadiness on feet (R26.81);Muscle weakness (generalized) (M62.81)     Time: [id number] PT Time Calculation (min) (ACUTE ONLY): 34 min  Charges:  $Therapeutic Exercise: 8-22 mins $Therapeutic Activity: 8-22 mins                     Roney Marion, PT  Acute Rehabilitation Services Pager [phone] Office [place] 05/09/2020, 4:10 PM

## 2020-05-10 ENCOUNTER — Other Ambulatory Visit: Payer: Self-pay

## 2020-05-10 DIAGNOSIS — R4182 Altered mental status, unspecified: Secondary | ICD-10-CM | POA: Diagnosis not present

## 2020-05-10 DIAGNOSIS — E1140 Type 2 diabetes mellitus with diabetic neuropathy, unspecified: Secondary | ICD-10-CM | POA: Diagnosis not present

## 2020-05-10 DIAGNOSIS — L03115 Cellulitis of right lower limb: Secondary | ICD-10-CM

## 2020-05-10 DIAGNOSIS — I4510 Unspecified right bundle-branch block: Secondary | ICD-10-CM | POA: Diagnosis not present

## 2020-05-10 DIAGNOSIS — I4891 Unspecified atrial fibrillation: Secondary | ICD-10-CM | POA: Diagnosis not present

## 2020-05-10 DIAGNOSIS — Z96653 Presence of artificial knee joint, bilateral: Secondary | ICD-10-CM | POA: Diagnosis not present

## 2020-05-10 DIAGNOSIS — E785 Hyperlipidemia, unspecified: Secondary | ICD-10-CM | POA: Diagnosis not present

## 2020-05-10 DIAGNOSIS — L0390 Cellulitis, unspecified: Secondary | ICD-10-CM | POA: Diagnosis not present

## 2020-05-10 DIAGNOSIS — D631 Anemia in chronic kidney disease: Secondary | ICD-10-CM | POA: Diagnosis not present

## 2020-05-10 DIAGNOSIS — Z20822 Contact with and (suspected) exposure to covid-19: Secondary | ICD-10-CM | POA: Diagnosis not present

## 2020-05-10 DIAGNOSIS — N186 End stage renal disease: Secondary | ICD-10-CM | POA: Diagnosis not present

## 2020-05-10 DIAGNOSIS — I120 Hypertensive chronic kidney disease with stage 5 chronic kidney disease or end stage renal disease: Secondary | ICD-10-CM | POA: Diagnosis not present

## 2020-05-10 DIAGNOSIS — Z4931 Encounter for adequacy testing for hemodialysis: Secondary | ICD-10-CM | POA: Diagnosis not present

## 2020-05-10 DIAGNOSIS — R0902 Hypoxemia: Secondary | ICD-10-CM | POA: Diagnosis not present

## 2020-05-10 DIAGNOSIS — Z9181 History of falling: Secondary | ICD-10-CM | POA: Diagnosis not present

## 2020-05-10 DIAGNOSIS — I132 Hypertensive heart and chronic kidney disease with heart failure and with stage 5 chronic kidney disease, or end stage renal disease: Secondary | ICD-10-CM | POA: Diagnosis not present

## 2020-05-10 DIAGNOSIS — R278 Other lack of coordination: Secondary | ICD-10-CM | POA: Diagnosis not present

## 2020-05-10 DIAGNOSIS — R5381 Other malaise: Secondary | ICD-10-CM | POA: Diagnosis not present

## 2020-05-10 DIAGNOSIS — J811 Chronic pulmonary edema: Secondary | ICD-10-CM | POA: Diagnosis not present

## 2020-05-10 DIAGNOSIS — I10 Essential (primary) hypertension: Secondary | ICD-10-CM | POA: Diagnosis not present

## 2020-05-10 DIAGNOSIS — E11319 Type 2 diabetes mellitus with unspecified diabetic retinopathy without macular edema: Secondary | ICD-10-CM | POA: Diagnosis not present

## 2020-05-10 DIAGNOSIS — Z743 Need for continuous supervision: Secondary | ICD-10-CM | POA: Diagnosis not present

## 2020-05-10 DIAGNOSIS — R29810 Facial weakness: Secondary | ICD-10-CM | POA: Diagnosis not present

## 2020-05-10 DIAGNOSIS — R531 Weakness: Secondary | ICD-10-CM | POA: Diagnosis not present

## 2020-05-10 DIAGNOSIS — R600 Localized edema: Secondary | ICD-10-CM | POA: Diagnosis not present

## 2020-05-10 DIAGNOSIS — R404 Transient alteration of awareness: Secondary | ICD-10-CM | POA: Diagnosis not present

## 2020-05-10 DIAGNOSIS — E875 Hyperkalemia: Secondary | ICD-10-CM | POA: Diagnosis not present

## 2020-05-10 DIAGNOSIS — I491 Atrial premature depolarization: Secondary | ICD-10-CM | POA: Diagnosis not present

## 2020-05-10 DIAGNOSIS — E039 Hypothyroidism, unspecified: Secondary | ICD-10-CM | POA: Diagnosis not present

## 2020-05-10 DIAGNOSIS — N2581 Secondary hyperparathyroidism of renal origin: Secondary | ICD-10-CM | POA: Diagnosis not present

## 2020-05-10 DIAGNOSIS — L988 Other specified disorders of the skin and subcutaneous tissue: Secondary | ICD-10-CM | POA: Diagnosis not present

## 2020-05-10 DIAGNOSIS — S81801A Unspecified open wound, right lower leg, initial encounter: Secondary | ICD-10-CM | POA: Diagnosis present

## 2020-05-10 DIAGNOSIS — I872 Venous insufficiency (chronic) (peripheral): Secondary | ICD-10-CM | POA: Diagnosis not present

## 2020-05-10 DIAGNOSIS — F32A Depression, unspecified: Secondary | ICD-10-CM | POA: Diagnosis not present

## 2020-05-10 DIAGNOSIS — Z79899 Other long term (current) drug therapy: Secondary | ICD-10-CM | POA: Diagnosis not present

## 2020-05-10 DIAGNOSIS — S80811A Abrasion, right lower leg, initial encounter: Secondary | ICD-10-CM | POA: Diagnosis not present

## 2020-05-10 DIAGNOSIS — E1142 Type 2 diabetes mellitus with diabetic polyneuropathy: Secondary | ICD-10-CM | POA: Diagnosis not present

## 2020-05-10 DIAGNOSIS — I482 Chronic atrial fibrillation, unspecified: Secondary | ICD-10-CM | POA: Diagnosis not present

## 2020-05-10 DIAGNOSIS — D638 Anemia in other chronic diseases classified elsewhere: Secondary | ICD-10-CM | POA: Diagnosis not present

## 2020-05-10 DIAGNOSIS — R5383 Other fatigue: Secondary | ICD-10-CM | POA: Diagnosis not present

## 2020-05-10 DIAGNOSIS — F322 Major depressive disorder, single episode, severe without psychotic features: Secondary | ICD-10-CM | POA: Diagnosis not present

## 2020-05-10 DIAGNOSIS — E1122 Type 2 diabetes mellitus with diabetic chronic kidney disease: Secondary | ICD-10-CM | POA: Diagnosis not present

## 2020-05-10 DIAGNOSIS — R410 Disorientation, unspecified: Secondary | ICD-10-CM | POA: Diagnosis not present

## 2020-05-10 DIAGNOSIS — E08610 Diabetes mellitus due to underlying condition with diabetic neuropathic arthropathy: Secondary | ICD-10-CM | POA: Diagnosis not present

## 2020-05-10 DIAGNOSIS — X58XXXA Exposure to other specified factors, initial encounter: Secondary | ICD-10-CM | POA: Diagnosis not present

## 2020-05-10 DIAGNOSIS — R279 Unspecified lack of coordination: Secondary | ICD-10-CM | POA: Diagnosis not present

## 2020-05-10 DIAGNOSIS — Z992 Dependence on renal dialysis: Secondary | ICD-10-CM | POA: Diagnosis not present

## 2020-05-10 DIAGNOSIS — Z96641 Presence of right artificial hip joint: Secondary | ICD-10-CM | POA: Diagnosis not present

## 2020-05-10 DIAGNOSIS — I5043 Acute on chronic combined systolic (congestive) and diastolic (congestive) heart failure: Secondary | ICD-10-CM | POA: Diagnosis not present

## 2020-05-10 DIAGNOSIS — Z0000 Encounter for general adult medical examination without abnormal findings: Secondary | ICD-10-CM | POA: Diagnosis not present

## 2020-05-10 DIAGNOSIS — M2550 Pain in unspecified joint: Secondary | ICD-10-CM | POA: Diagnosis not present

## 2020-05-10 DIAGNOSIS — I517 Cardiomegaly: Secondary | ICD-10-CM | POA: Diagnosis not present

## 2020-05-10 DIAGNOSIS — Z7401 Bed confinement status: Secondary | ICD-10-CM | POA: Diagnosis not present

## 2020-05-10 DIAGNOSIS — R400 Somnolence: Secondary | ICD-10-CM | POA: Diagnosis not present

## 2020-05-10 LAB — CBC
HCT: 28.9 % — ABNORMAL LOW (ref 39.0–52.0)
Hemoglobin: 8.9 g/dL — ABNORMAL LOW (ref 13.0–17.0)
MCH: 29.6 pg (ref 26.0–34.0)
MCHC: 30.8 g/dL (ref 30.0–36.0)
MCV: 96 fL (ref 80.0–100.0)
Platelets: 123 10*3/uL — ABNORMAL LOW (ref 150–400)
RBC: 3.01 MIL/uL — ABNORMAL LOW (ref 4.22–5.81)
RDW: 18.4 % — ABNORMAL HIGH (ref 11.5–15.5)
WBC: 6 10*3/uL (ref 4.0–10.5)
nRBC: 0 % (ref 0.0–0.2)

## 2020-05-10 LAB — RENAL FUNCTION PANEL
Albumin: 2.7 g/dL — ABNORMAL LOW (ref 3.5–5.0)
Anion gap: 12 (ref 5–15)
BUN: 44 mg/dL — ABNORMAL HIGH (ref 8–23)
CO2: 25 mmol/L (ref 22–32)
Calcium: 7.8 mg/dL — ABNORMAL LOW (ref 8.9–10.3)
Chloride: 89 mmol/L — ABNORMAL LOW (ref 98–111)
Creatinine, Ser: 9.4 mg/dL — ABNORMAL HIGH (ref 0.61–1.24)
GFR, Estimated: 6 mL/min — ABNORMAL LOW (ref >60)
Glucose, Bld: 97 mg/dL (ref 70–99)
Phosphorus: 5.2 mg/dL — ABNORMAL HIGH (ref 2.5–4.6)
Potassium: 4 mmol/L (ref 3.5–5.1)
Sodium: 126 mmol/L — ABNORMAL LOW (ref 135–145)

## 2020-05-10 LAB — RESP PANEL BY RT-PCR (FLU A&B, COVID) ARPGX2
Influenza A by PCR: NEGATIVE
Influenza B by PCR: NEGATIVE
SARS Coronavirus 2 by RT PCR: NEGATIVE

## 2020-05-10 MED ORDER — AMLODIPINE BESYLATE 5 MG PO TABS: 5.0000 mg | ORAL_TABLET | Freq: Every day | ORAL | Status: AC

## 2020-05-10 MED ORDER — BUSPIRONE HCL 5 MG PO TABS: 5.0000 mg | ORAL_TABLET | Freq: Two times a day (BID) | ORAL | Status: AC

## 2020-05-10 NOTE — Progress Notes (Signed)
Called report to [place] at [phone], spoke with Amy, RN.   Patient will be going to Room 109E- Bed B.  Aurther Loft, RN

## 2020-05-10 NOTE — Patient Outreach (Signed)
[facility]) Care Management  05/10/2020  Zeal Stockley 03-Nov-1955 [id number]   Case Closure    Patient has been inpatient greater than 10 days. Plan is for discharge to SNF.   Plan: RN CM will close case at this time.   Enzo Montgomery, RN,BSN,CCM [place] Management Telephonic Care Management Coordinator Direct Phone: [phone] Toll Free: [phone] Fax: [phone]

## 2020-05-10 NOTE — Plan of Care (Signed)
  Problem: Fluid Volume: Goal: Compliance with measures to maintain balanced fluid volume will improve Outcome: Progressing   Problem: Health Behavior/Discharge Planning: Goal: Ability to manage health-related needs will improve Outcome: Progressing   Problem: Clinical Measurements: Goal: Complications related to the disease process, condition or treatment will be avoided or minimized Outcome: Progressing   Problem: Education: Goal: Knowledge of General Education information will improve Description: Including pain rating scale, medication(s)/side effects and non-pharmacologic comfort measures Outcome: Progressing   Problem: Health Behavior/Discharge Planning: Goal: Ability to manage health-related needs will improve Outcome: Progressing   Problem: Clinical Measurements: Goal: Ability to maintain clinical measurements within normal limits will improve Outcome: Progressing   Problem: Activity: Goal: Risk for activity intolerance will decrease Outcome: Progressing   Problem: Nutrition: Goal: Adequate nutrition will be maintained Outcome: Progressing   Problem: Coping: Goal: Level of anxiety will decrease Outcome: Progressing   Problem: Elimination: Goal: Will not experience complications related to bowel motility Outcome: Progressing   Problem: Pain Managment: Goal: General experience of comfort will improve Outcome: Progressing   Problem: Safety: Goal: Ability to remain free from injury will improve Outcome: Progressing   Problem: Skin Integrity: Goal: Risk for impaired skin integrity will decrease Outcome: Progressing

## 2020-05-10 NOTE — Procedures (Signed)
I was present at this dialysis session. I have reviewed the session itself and made appropriate changes.   Filed Weights   05/09/20 2124 05/10/20 0711 05/10/20 1149  Weight: 100 kg 105 kg 102.3 kg    Recent Labs  Lab 05/10/20 0821  NA 126*  K 4.0  CL 89*  CO2 25  GLUCOSE 97  BUN 44*  CREATININE 9.40*  CALCIUM 7.8*  PHOS 5.2*    Recent Labs  Lab 05/06/20 1314 05/09/20 0829 05/10/20 0821  WBC 6.0 5.9 6.0  HGB 9.4* 9.9* 8.9*  HCT 30.7* 31.1* 28.9*  MCV 97.2 95.4 96.0  PLT 123* 115* 123*    Scheduled Meds: . amitriptyline  50 mg Oral QHS  . amLODipine  5 mg Oral QHS  . busPIRone  5 mg Oral BID  . Chlorhexidine Gluconate Cloth  6 each Topical Q0600  . cinacalcet  30 mg Oral Q supper  . darbepoetin (ARANESP) injection - DIALYSIS  100 mcg Intravenous Q Fri-HD  . ferric citrate  420 mg Oral TID WC  . gabapentin  100 mg Oral QHS  . heparin  5,000 Units Subcutaneous Q8H  . hydrocerin   Topical BID  . simvastatin  20 mg Oral q1800   Continuous Infusions: PRN Meds:.acetaminophen **OR** acetaminophen, camphor-menthol, methocarbamol   Santiago Bumpers,  MD 05/10/2020, 12:23 PM

## 2020-05-10 NOTE — Discharge Summary (Signed)
Physician Discharge Summary   Nandan Bollmann [id number] DOB: 07/11/1955 DOA: 04/29/2020  PCP: [name], MD  Admit date: 04/29/2020 Discharge date:    Admitted From: SNF Disposition:  SNF Discharging physician: Dwyane Dee, MD  Recommendations for Outpatient Follow-up:  1. Continue wound care for RLE   Patient discharged to SNF in Discharge Condition: stable Risk of unplanned readmission score: Unplanned Admission- Pilot do not use: 28.71  CODE STATUS: Full Diet recommendation:  Diet Orders (From admission, onward)    Start     Ordered   04/30/20 0006  Diet renal with fluid restriction Fluid restriction: 1200 mL Fluid; Room service appropriate? Yes; Fluid consistency: Thin  Diet effective now       Question Answer Comment  Fluid restriction: 1200 mL Fluid   Room service appropriate? Yes   Fluid consistency: Thin      04/30/20 0006          Hospital Course:  ESRD on HD MWF Nonadherence to HD Hyperkalemia Volume overload Presents with noncompliance with HD. Underwent urgent HD in the ER. Currently volume status back to normal. Tolerating HD and agreeable to continue with HD for now  Right lower leg cellulitis Chronic venous stasis ulcers bilaterally. Doppler negative for DVT. X-ray right knee negative for any acute abnormality. Treated with IV antibiotics. Course completed - continue wound care at discharge   Right knee pain. 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 Patient is on amitriptyline. Continue BuSpar. Denies any suicidal ideation.  HLD Continue statin  Anemia of chronic kidney disease Hypokalemia Hypercalcemia Hyperphosphatemia Managed by nephrology  OSA Obesity Noncompliant with CPAP Placing the patient at high risk for poor outcome Body mass index is 32.56 kg/m.    Principal Diagnosis: ESRD  (end stage renal disease) on dialysis [facility])  Discharge Diagnoses: Active Hospital Problems   Diagnosis Date Noted  . ESRD (end stage renal disease) on dialysis ([place])   . Hyperphosphatemia 04/29/2020  . Cellulitis of right lower extremity 04/29/2020  . Anemia of chronic renal failure, unspecified stage 07/07/2014  . AF (paroxysmal atrial fibrillation) ([place]) 12/30/2012  . Diabetes mellitus with neuropathy ([place])   . Hyperlipidemia     Resolved Hospital Problems  No resolved problems to display.    Discharge Instructions    Discharge wound care:   Complete by: As directed    Apply dressing daily to right lower extremity   Increase activity slowly   Complete by: As directed      Allergies as of 05/10/2020      Reactions   Tobacco [tobacco] Shortness Of Breath   Other    Perfume & cologne-eyes itchy      Medication List    TAKE these medications   amitriptyline 50 MG tablet Commonly known as: ELAVIL Take 1 tablet (50 mg total) by mouth at bedtime.   amLODipine 5 MG tablet Commonly known as: NORVASC Take 1 tablet (5 mg total) by mouth at bedtime.   Auryxia 1 GM 210 MG(Fe) tablet Generic drug: ferric citrate Take 210 mg by mouth 3 (three) times daily with meals.   b complex-vitamin c-folic acid 0.8 MG Tabs tablet Take 1 tablet by mouth daily.   busPIRone 5 MG tablet Commonly known as: BUSPAR Take 1 tablet (5 mg total) by mouth 2 (two) times daily.   cinacalcet 30 MG tablet Commonly known as: SENSIPAR Take 30 mg  by mouth daily with supper.   gabapentin 100 MG capsule Commonly known as: NEURONTIN Take 2 capsules (200 mg total) by mouth at bedtime. 200 mg qam and 300 mg upon return from dialysis on M-W-F for neuropathy What changed:   how much to take  additional instructions   simvastatin 20 MG tablet Commonly known as: ZOCOR Take 1 tablet (20 mg total) by mouth daily.            Discharge Care Instructions  (From admission, onward)         Start      Ordered   05/10/20 0000  Discharge wound care:       Comments: Apply dressing daily to right lower extremity   05/10/20 1443          Contact information for after-discharge care    [facility] .   Service: Skilled Nursing Contact information: [place] [address] [phone]                 Allergies  Allergen Reactions  . Tobacco [Tobacco] Shortness Of Breath  . Other     Perfume & cologne-eyes itchy    Consultations: Nephrology  Discharge Exam: BP (!) 139/92 (BP Location: Left Arm)   Pulse 74   Temp 98 F (36.7 C) (Oral)   Resp 18   Ht '5\' 9"'$  (1.753 m)   Wt 102.3 kg   SpO2 100%   BMI 33.31 kg/m  General appearance: alert, cooperative and no distress Head: Normocephalic, without obvious abnormality, atraumatic Eyes: EOMI Lungs: clear to auscultation bilaterally Heart: regular rate and rhythm and S1, S2 normal Abdomen: normal findings: bowel sounds normal and soft, non-tender Extremities: Left upper extremity fistula noted with good bruit and palpable thrill.;  Contracted right lower extremity at the knee. Skin: mobility and turgor normal Neurologic: Limited strength in lower extremities.  Follows commands and moves all 4 extremities  The results of significant diagnostics from this hospitalization (including imaging, microbiology, ancillary and laboratory) are listed below for reference.   Microbiology: No results found for this or any previous visit (from the past 240 hour(s)).   Labs: BNP (last 3 results) Recent Labs    04/29/20 1612  BNP [id number]*   Basic Metabolic Panel: Recent Labs  Lab 05/05/20 0747 05/06/20 1314 05/09/20 0829 05/10/20 0821  NA 132* 131* 129* 126*  K 3.8 3.5 3.7 4.0  CL 95* 94* 92* 89*  CO2 '26 27 25 25  '$ GLUCOSE 134* 162* 111* 97  BUN 40* 22 36* 44*  CREATININE 9.19* 5.90* 8.73* 9.40*  CALCIUM 8.3* 8.2* 8.3* 7.8*  PHOS 5.3* 2.9 4.4 5.2*   Liver Function  Tests: Recent Labs  Lab 05/05/20 0747 05/06/20 1314 05/09/20 0829 05/10/20 0821  ALBUMIN 2.7* 2.7* 2.8* 2.7*   No results for input(s): LIPASE, AMYLASE in the last 168 hours. No results for input(s): AMMONIA in the last 168 hours. CBC: Recent Labs  Lab 05/05/20 0747 05/06/20 1314 05/09/20 0829 05/10/20 0821  WBC 5.7 6.0 5.9 6.0  HGB 9.3* 9.4* 9.9* 8.9*  HCT 29.3* 30.7* 31.1* 28.9*  MCV 94.5 97.2 95.4 96.0  PLT 153 123* 115* 123*   Cardiac Enzymes: No results for input(s): CKTOTAL, CKMB, CKMBINDEX, TROPONINI in the last 168 hours. BNP: Invalid input(s): POCBNP CBG: No results for input(s): GLUCAP in the last 168 hours. D-Dimer No results for input(s): DDIMER in the last 72 hours. Hgb A1c No results for  input(s): HGBA1C in the last 72 hours. Lipid Profile No results for input(s): CHOL, HDL, LDLCALC, TRIG, CHOLHDL, LDLDIRECT in the last 72 hours. Thyroid function studies No results for input(s): TSH, T4TOTAL, T3FREE, THYROIDAB in the last 72 hours.  Invalid input(s): FREET3 Anemia work up No results for input(s): VITAMINB12, FOLATE, FERRITIN, TIBC, IRON, RETICCTPCT in the last 72 hours. Urinalysis    Component Value Date/Time   COLORURINE YELLOW 01/17/2016 1047   APPEARANCEUR CLOUDY (A) 01/17/2016 1047   LABSPEC 1.016 01/17/2016 1047   PHURINE 5.0 01/17/2016 1047   GLUCOSEU NEGATIVE 01/17/2016 1047   GLUCOSEU NEGATIVE 12/30/2012 1103   HGBUR LARGE (A) 01/17/2016 1047   BILIRUBINUR SMALL (A) 01/17/2016 1047   KETONESUR NEGATIVE 01/17/2016 1047   PROTEINUR 100 (A) 01/17/2016 1047   UROBILINOGEN 1.0 08/25/2014 0641   NITRITE NEGATIVE 01/17/2016 1047   LEUKOCYTESUR LARGE (A) 01/17/2016 1047   Sepsis Labs Invalid input(s): PROCALCITONIN,  WBC,  LACTICIDVEN Microbiology No results found for this or any previous visit (from the past 240 hour(s)).  Procedures/Studies: DG Chest 1 View  Result Date: 04/29/2020 CLINICAL DATA:  [place] 2 weeks ago, RIGHT knee pain,  history CHF, pulmonary hypertension, diabetes mellitus, hypertension, MI EXAM: PORTABLE CHEST 1 VIEW COMPARISON:  Portable exam 1332 hours compared to 01/11/2020 FINDINGS: Enlargement of cardiac silhouette with vascular congestion. Mediastinal contours normal. BILATERAL pulmonary infiltrates, greatest in RIGHT upper lobe, favor pulmonary edema over infection. No pleural effusion or pneumothorax. Osseous structures unremarkable. IMPRESSION: Enlargement of cardiac silhouette with pulmonary vascular congestion and BILATERAL pulmonary infiltrates, favor pulmonary edema over infection. Electronically Signed   By: Lavonia Dana M.D.   On: 04/29/2020 16:34   DG Knee 1-2 Views Right  Result Date: 04/29/2020 CLINICAL DATA:  Fall 2 weeks prior, right knee pain and swelling EXAM: AP and lateral right knee radiographs. COMPARISON:  04/22/2020 right knee radiographs FINDINGS: Status post right total knee arthroplasty, with no hardware fracture or loosening. Small suprapatellar right knee joint effusion. No bone fracture. No dislocation. No suspicious focal osseous lesions. Vascular calcifications throughout the soft tissues. Small superior and inferior patellar enthesophytes. IMPRESSION: Small suprapatellar right knee joint effusion. No bone fracture or dislocation. Right total knee arthroplasty, with no evidence of hardware complication. Electronically Signed   By: Ilona Sorrel M.D.   On: 04/29/2020 15:44   DG Tibia/Fibula Right  Result Date: 04/29/2020 CLINICAL DATA:  Fall 2 weeks prior, right lower extremity pain and swelling EXAM: AP and lateral views of the right tibia and fibula COMPARISON:  04/22/2020 right knee radiographs FINDINGS: Status post right total knee arthroplasty with no evidence of hardware fracture or loosening on these views. No bone fracture. No dislocation at the knee or ankle. No focal osseous lesions. No erosions or periosteal reaction. Vascular calcifications throughout the soft tissues. Small  Achilles and plantar right calcaneal spurs. Superior and inferior right patellar enthesophytes. IMPRESSION: Right total knee arthroplasty, with no evidence of hardware complication on these views. No bone fracture in the right tibia or fibula. Electronically Signed   By: Ilona Sorrel M.D.   On: 04/29/2020 15:44   DG Knee Complete 4 Views Right  Result Date: 04/22/2020 CLINICAL DATA:  65 year old wheelchair-bound patient who fell last night onto his coffee table, injuring the RIGHT knee. Initial encounter. Personal history of RIGHT knee arthroplasty. EXAM: RIGHT KNEE - COMPLETE 4+ VIEW COMPARISON:  02/10/2019 and earlier. FINDINGS: The patient was unable to fully extend the knee, therefore all of the images were obtained in flexion.  No evidence of acute fracture. The prosthesis appears anatomically aligned while flexed. 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 Chronic dystrophic calcifications/ossification involving the patellar tendon. Possible small joint effusion. Suprapatellar soft tissue swelling. Extensive arterial calcifications. IMPRESSION: 1. No acute osseous abnormality. 2. Possible small joint effusion. 3. Suprapatellar soft tissue swelling. Electronically Signed   By: Evangeline Dakin M.D.   On: 04/22/2020 10:38   VAS [place] LOWER EXTREMITY VENOUS (DVT) (ONLY [facility] & [facility] 7a-7p)  Result Date: 05/01/2020  Lower Venous DVT Study Indications: Swelling.  Risk Factors: None identified. Limitations: Body habitus, poor ultrasound/tissue interface, open wound and leg induration, patient pain tolerance. Comparison Study: No prior studies. Performing Technologist: Oliver Hum RVT  Examination Guidelines: A complete evaluation includes B-mode imaging, spectral Doppler, color Doppler, and power Doppler as needed of all accessible portions of each vessel. Bilateral testing is considered an integral part of a complete examination. Limited examinations for reoccurring  indications may be performed as noted. The reflux portion of the exam is performed with the patient in reverse Trendelenburg.  +---------+---------------+---------+-----------+----------+--------------+ RIGHT    CompressibilityPhasicitySpontaneityPropertiesThrombus Aging +---------+---------------+---------+-----------+----------+--------------+ CFV      Full           Yes      Yes                                 +---------+---------------+---------+-----------+----------+--------------+ SFJ      Full                                                        +---------+---------------+---------+-----------+----------+--------------+ FV Prox  Full                                                        +---------+---------------+---------+-----------+----------+--------------+ FV Mid                  Yes      Yes                                 +---------+---------------+---------+-----------+----------+--------------+ FV Distal               Yes      Yes                                 +---------+---------------+---------+-----------+----------+--------------+ PFV      Full                                                        +---------+---------------+---------+-----------+----------+--------------+ POP      Full           Yes      Yes                                 +---------+---------------+---------+-----------+----------+--------------+  PTV      Full                                                        +---------+---------------+---------+-----------+----------+--------------+ PERO     Full                                                        +---------+---------------+---------+-----------+----------+--------------+   +----+---------------+---------+-----------+----------+--------------+ LEFTCompressibilityPhasicitySpontaneityPropertiesThrombus Aging +----+---------------+---------+-----------+----------+--------------+ CFV Full            Yes      Yes                                 +----+---------------+---------+-----------+----------+--------------+     Summary: RIGHT: - There is no evidence of deep vein thrombosis in the lower extremity. However, portions of this examination were limited- see technologist comments above.  - No cystic structure found in the popliteal fossa.  LEFT: - No evidence of common femoral vein obstruction.  *See table(s) above for measurements and observations. Electronically signed by Ruta Hinds MD on 05/01/2020 at 11:20:50 AM.    Final    VAS [place] LOWER EXTREMITY VENOUS (DVT) (ONLY [facility] & [facility])  Result Date: 04/23/2020  Lower Venous DVT Study Indications: Pain.  Comparison Study: no prior Performing Technologist: Abram Sander RVS  Examination Guidelines: A complete evaluation includes B-mode imaging, spectral Doppler, color Doppler, and power Doppler as needed of all accessible portions of each vessel. Bilateral testing is considered an integral part of a complete examination. Limited examinations for reoccurring indications may be performed as noted. The reflux portion of the exam is performed with the patient in reverse Trendelenburg.  +---------+---------------+---------+-----------+----------+--------------+ RIGHT    CompressibilityPhasicitySpontaneityPropertiesThrombus Aging +---------+---------------+---------+-----------+----------+--------------+ CFV      Full           Yes      Yes                                 +---------+---------------+---------+-----------+----------+--------------+ SFJ      Full                                                        +---------+---------------+---------+-----------+----------+--------------+ FV Prox  Full                                                        +---------+---------------+---------+-----------+----------+--------------+ FV Mid   Full                                                         +---------+---------------+---------+-----------+----------+--------------+  FV DistalFull                                                        +---------+---------------+---------+-----------+----------+--------------+ PFV      Full                                                        +---------+---------------+---------+-----------+----------+--------------+ POP      Full           Yes      Yes                                 +---------+---------------+---------+-----------+----------+--------------+ PTV      Full                                                        +---------+---------------+---------+-----------+----------+--------------+ PERO     Full                                                        +---------+---------------+---------+-----------+----------+--------------+   +----+---------------+---------+-----------+----------+--------------+ LEFTCompressibilityPhasicitySpontaneityPropertiesThrombus Aging +----+---------------+---------+-----------+----------+--------------+ CFV                Yes      Yes                                 +----+---------------+---------+-----------+----------+--------------+     Summary: RIGHT: - There is no evidence of deep vein thrombosis in the lower extremity.  - No cystic structure found in the popliteal fossa.  LEFT: - No evidence of common femoral vein obstruction.  *See table(s) above for measurements and observations. Electronically signed by Harold Barban MD on 04/23/2020 at 4:20:53 PM.    Final      Time coordinating discharge: Over 68 minutes    Dwyane Dee, MD  [facility] 05/10/2020, 2:43 PM

## 2020-05-10 NOTE — Plan of Care (Signed)
  Problem: Fluid Volume: Goal: Compliance with measures to maintain balanced fluid volume will improve 05/10/2020 1706 by Vira Agar, RN Outcome: Completed/Met 05/10/2020 1651 by Vira Agar, RN Outcome: Progressing   Problem: Health Behavior/Discharge Planning: Goal: Ability to manage health-related needs will improve 05/10/2020 1706 by Vira Agar, RN Outcome: Completed/Met 05/10/2020 1651 by Vira Agar, RN Outcome: Progressing   Problem: Clinical Measurements: Goal: Complications related to the disease process, condition or treatment will be avoided or minimized 05/10/2020 1706 by Vira Agar, RN Outcome: Completed/Met 05/10/2020 1651 by Vira Agar, RN Outcome: Progressing   Problem: Education: Goal: Knowledge of General Education information will improve Description: Including pain rating scale, medication(s)/side effects and non-pharmacologic comfort measures 05/10/2020 1706 by Vira Agar, RN Outcome: Completed/Met 05/10/2020 1651 by Vira Agar, RN Outcome: Progressing   Problem: Health Behavior/Discharge Planning: Goal: Ability to manage health-related needs will improve 05/10/2020 1706 by Vira Agar, RN Outcome: Completed/Met 05/10/2020 1651 by Vira Agar, RN Outcome: Progressing   Problem: Clinical Measurements: Goal: Ability to maintain clinical measurements within normal limits will improve 05/10/2020 1706 by Vira Agar, RN Outcome: Completed/Met 05/10/2020 1651 by Vira Agar, RN Outcome: Progressing Goal: Will remain free from infection Outcome: Completed/Met Goal: Diagnostic test results will improve Outcome: Completed/Met Goal: Respiratory complications will improve Outcome: Completed/Met Goal: Cardiovascular complication will be avoided Outcome: Completed/Met   Problem: Activity: Goal: Risk for activity intolerance will decrease 05/10/2020 1706 by Vira Agar, RN Outcome: Completed/Met 05/10/2020 1651  by Vira Agar, RN Outcome: Progressing   Problem: Nutrition: Goal: Adequate nutrition will be maintained 05/10/2020 1706 by Vira Agar, RN Outcome: Completed/Met 05/10/2020 1651 by Vira Agar, RN Outcome: Progressing   Problem: Coping: Goal: Level of anxiety will decrease 05/10/2020 1706 by Vira Agar, RN Outcome: Completed/Met 05/10/2020 1651 by Vira Agar, RN Outcome: Progressing   Problem: Elimination: Goal: Will not experience complications related to bowel motility 05/10/2020 1706 by Vira Agar, RN Outcome: Completed/Met 05/10/2020 1651 by Vira Agar, RN Outcome: Progressing Goal: Will not experience complications related to urinary retention Outcome: Completed/Met   Problem: Pain Managment: Goal: General experience of comfort will improve 05/10/2020 1706 by Vira Agar, RN Outcome: Completed/Met 05/10/2020 1651 by Vira Agar, RN Outcome: Progressing   Problem: Safety: Goal: Ability to remain free from injury will improve 05/10/2020 1706 by Vira Agar, RN Outcome: Completed/Met 05/10/2020 1651 by Vira Agar, RN Outcome: Progressing   Problem: Skin Integrity: Goal: Risk for impaired skin integrity will decrease 05/10/2020 1706 by Vira Agar, RN Outcome: Completed/Met 05/10/2020 1651 by Vira Agar, RN Outcome: Progressing

## 2020-05-10 NOTE — Progress Notes (Signed)
PT Cancellation Note  Patient Details Name: Daniel Whitney MRN: [id number] DOB: 1955-06-19   Cancelled Treatment:    Reason Eval/Treat Not Completed: Patient at procedure or test/unavailable   Currently in HD;  Will follow up later today as time allows;  Otherwise, will follow up for PT tomorrow;   Thank you,  Roney Marion, PT  Acute Rehabilitation Services Pager [phone] Office [phone]     Colletta [place] 05/10/2020, 11:02 AM

## 2020-05-10 NOTE — Plan of Care (Signed)
  Problem: Health Behavior/Discharge Planning: Goal: Ability to manage health-related needs will improve Outcome: Progressing   Problem: Clinical Measurements: Goal: Complications related to the disease process, condition or treatment will be avoided or minimized Outcome: Progressing

## 2020-05-10 NOTE — TOC Transition Note (Addendum)
Transition of Care [facility]) - CM/SW Discharge Note *Discharged to [facility] *[place]   Patient Details  Name: Daniel Whitney MRN: [id number] Date of Birth: [birth year]  Transition of Care [facility]) CM/SW Contact:  Sable Feil, LCSW Phone Number: 05/10/2020, 3:30 PM   Clinical Narrative:   Patient medically stable for discharge and going to [facility] for [place] rehab. MD informed that facility received insurance authorization. Patient informed today that the facility received insurance authorization and he would be discharging today. CSW talked with patient's daughter by phone while in room with patient. Mr. Brokaw will be transport by non-emergency ambulance transport.  Final next level of care: [place] ([place]) Barriers to Discharge: Barriers Resolved   Patient Goals and CMS Choice Patient states their goals for this hospitalization and ongoing recovery are:: Patient in agreement with ST rehab before returning home CMS Medicare.gov Compare Post Acute Care list provided to:: Patient Choice offered to / list presented to : Patient  Discharge Placement   Existing PASRR number confirmed : 05/03/20          Patient chooses bed at: [facility] Patient to be transferred to facility by: Non-emergency ambulance transport Name of family member notified: Daughter - patient was talking on phone with daughter when [place] visited and she asked to speak with CSW Patient and family notified of of transfer: 05/10/20  Discharge Plan and Services In-house Referral: Clinical Social Work                                   Social Determinants of Health ([place]) Interventions  No SDOH interventions requested or needed at discharge   Readmission Risk Interventions Readmission Risk Prevention Plan 01/12/2020  Transportation Screening Complete  PCP or Specialist Appt within 3-5 Days Complete  HRI or Home Care Consult Complete  Social Work  Consult for [place] Planning/Counseling Complete  Palliative Care Screening Not Applicable  Medication Review Press photographer) Complete  Some recent data might be hidden

## 2020-05-10 NOTE — Progress Notes (Signed)
Subjective: Off schedule dialysis secondary to patient load yesterday HD RN lack, no complaints on dialysis today  Objective Vital signs in last 24 hours: Vitals:   05/10/20 0930 05/10/20 1000 05/10/20 1030 05/10/20 1100  BP: 136/79 111/62 131/68 134/78  Pulse: 64 64 65 68  Resp: '18 18 18 18  '$ Temp:      TempSrc:      SpO2:      Weight:      Height:       Weight change:   Physical Exam: General:Alert, male, NAD  Heart:RRR, no MRG Lungs:CTA  , nonlabored breathing Abdomen:Soft, BS positive, obese NTND  Extremities: Trace bipedal edema , Ace wrap dressing lower leg dry, clean Access:LUE AVF patent on dialysis  Dialysis Orders: [facility] MWF EDW 104.5 4 hr 15 min  F180 dialyzer BFR 450 DFR A1.5 2K/ 3.5 Ca bath AVF Heparin 5000 u bolus venofer 50 mg weekly mircera 200 mcg q 2 weeks, last given 04/04/20  Problem/Plan: 1Volume overload with nonadherence to hemodialysis txs , resolved with serial HD here. Now below OP  dry weight. Continue UF as tolerated, noted NA 126 today noted weight gain 5 kg if this is correct UF as tolerated on HD follow-up NA  2 ESRD: HD MWF-  off schedule today , follow-up labs volume ,HD tomorrow to get back on schedule with possible excess volume gain in hospital  3. Hyperkalemia(2/2 missed HD ) :Resolved with HD.  K 4 .0 today  4. LE wounds:Consistent with cellulitis/venous stasis . Antibiotics per primary - on Rocephin. Doppler study negative for DVT.   5Hypertension:Since admit BP and volume status improving with HD. On amlodipine '5mg'$ .  At bedtime.  Will havelower EDW at discharge ~~ 100.0 kg.  Follow-up weights BP after dialysis  6.  Anemia of ESRD= Hgb 9.9> 8.9 given Aranesp 100 on 3/25, follow-up Hgb trend  7. 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  Depression/anxiety/history of EtOH= on meds per admit  9.  Disposition =awaiting SNF placement  Ernest Haber, PA-C [facility] [phone] 05/10/2020,11:58 AM  LOS: 10 days   Labs: Basic Metabolic Panel: Recent Labs  Lab 05/06/20 1314 05/09/20 0829 05/10/20 0821  NA 131* 129* 126*  K 3.5 3.7 4.0  CL 94* 92* 89*  CO2 '27 25 25  '$ GLUCOSE 162* 111* 97  BUN 22 36* 44*  CREATININE 5.90* 8.73* 9.40*  CALCIUM 8.2* 8.3* 7.8*  PHOS 2.9 4.4 5.2*   Liver Function Tests: Recent Labs  Lab 05/06/20 1314 05/09/20 0829 05/10/20 0821  ALBUMIN 2.7* 2.8* 2.7*   No results for input(s): LIPASE, AMYLASE in the last 168 hours. No results for input(s): AMMONIA in the last 168 hours. CBC: Recent Labs  Lab 05/05/20 0747 05/06/20 1314 05/09/20 0829 05/10/20 0821  WBC 5.7 6.0 5.9 6.0  HGB 9.3* 9.4* 9.9* 8.9*  HCT 29.3* 30.7* 31.1* 28.9*  MCV 94.5 97.2 95.4 96.0  PLT 153 123* 115* 123*   Cardiac Enzymes: No results for input(s): CKTOTAL, CKMB, CKMBINDEX, TROPONINI in the last 168 hours. CBG: No results for input(s): GLUCAP in the last 168 hours.  Studies/Results: No results found. Medications:  . amitriptyline  50 mg Oral QHS  . amLODipine  5 mg Oral QHS  . busPIRone  5 mg Oral BID  . Chlorhexidine Gluconate Cloth  6 each Topical Q0600  . cinacalcet  30 mg Oral Q supper  . darbepoetin (ARANESP) injection - DIALYSIS  100 mcg Intravenous  Q Fri-HD  . ferric citrate  420 mg Oral TID WC  . gabapentin  100 mg Oral QHS  . heparin  5,000 Units Subcutaneous Q8H  . hydrocerin   Topical BID  . simvastatin  20 mg Oral q1800

## 2020-05-10 NOTE — Progress Notes (Signed)
DISCHARGE NOTE HOME Daniel Whitney to be discharged Skilled nursing facility per MD order. Discussed prescriptions and follow up appointments with the patient. Prescriptions given to patient; medication list explained in detail. Patient verbalized understanding.  Skin clean, dry and intact without evidence of skin break down, no evidence of skin tears noted. IV catheter discontinued intact. Site without signs and symptoms of complications. Dressing and pressure applied. Pt denies pain at the site currently. No complaints noted.  Patient free of lines, drains, and wounds.   An After Visit Summary (AVS) was printed and given to the patient. Patient escorted via stretcher, and discharged to [facility] via [place].  Vira Agar, RN

## 2020-05-11 DIAGNOSIS — N2581 Secondary hyperparathyroidism of renal origin: Secondary | ICD-10-CM | POA: Diagnosis not present

## 2020-05-11 DIAGNOSIS — Z992 Dependence on renal dialysis: Secondary | ICD-10-CM | POA: Diagnosis not present

## 2020-05-11 DIAGNOSIS — N186 End stage renal disease: Secondary | ICD-10-CM | POA: Diagnosis not present

## 2020-05-12 ENCOUNTER — Ambulatory Visit: Payer: Self-pay

## 2020-05-12 DIAGNOSIS — L0390 Cellulitis, unspecified: Secondary | ICD-10-CM | POA: Diagnosis not present

## 2020-05-12 DIAGNOSIS — N186 End stage renal disease: Secondary | ICD-10-CM | POA: Diagnosis not present

## 2020-05-12 DIAGNOSIS — F322 Major depressive disorder, single episode, severe without psychotic features: Secondary | ICD-10-CM | POA: Diagnosis not present

## 2020-05-12 DIAGNOSIS — I872 Venous insufficiency (chronic) (peripheral): Secondary | ICD-10-CM | POA: Diagnosis not present

## 2020-05-12 DIAGNOSIS — Z992 Dependence on renal dialysis: Secondary | ICD-10-CM | POA: Diagnosis not present

## 2020-05-12 DIAGNOSIS — E1122 Type 2 diabetes mellitus with diabetic chronic kidney disease: Secondary | ICD-10-CM | POA: Diagnosis not present

## 2020-05-12 DIAGNOSIS — I120 Hypertensive chronic kidney disease with stage 5 chronic kidney disease or end stage renal disease: Secondary | ICD-10-CM | POA: Diagnosis not present

## 2020-05-12 DIAGNOSIS — D638 Anemia in other chronic diseases classified elsewhere: Secondary | ICD-10-CM | POA: Diagnosis not present

## 2020-05-13 DIAGNOSIS — N186 End stage renal disease: Secondary | ICD-10-CM | POA: Diagnosis not present

## 2020-05-13 DIAGNOSIS — N2581 Secondary hyperparathyroidism of renal origin: Secondary | ICD-10-CM | POA: Diagnosis not present

## 2020-05-13 DIAGNOSIS — Z992 Dependence on renal dialysis: Secondary | ICD-10-CM | POA: Diagnosis not present

## 2020-05-16 DIAGNOSIS — N2581 Secondary hyperparathyroidism of renal origin: Secondary | ICD-10-CM | POA: Diagnosis not present

## 2020-05-16 DIAGNOSIS — Z992 Dependence on renal dialysis: Secondary | ICD-10-CM | POA: Diagnosis not present

## 2020-05-16 DIAGNOSIS — N186 End stage renal disease: Secondary | ICD-10-CM | POA: Diagnosis not present

## 2020-05-18 DIAGNOSIS — N2581 Secondary hyperparathyroidism of renal origin: Secondary | ICD-10-CM | POA: Diagnosis not present

## 2020-05-18 DIAGNOSIS — N186 End stage renal disease: Secondary | ICD-10-CM | POA: Diagnosis not present

## 2020-05-18 DIAGNOSIS — Z992 Dependence on renal dialysis: Secondary | ICD-10-CM | POA: Diagnosis not present

## 2020-05-20 DIAGNOSIS — N186 End stage renal disease: Secondary | ICD-10-CM | POA: Diagnosis not present

## 2020-05-20 DIAGNOSIS — Z992 Dependence on renal dialysis: Secondary | ICD-10-CM | POA: Diagnosis not present

## 2020-05-20 DIAGNOSIS — N2581 Secondary hyperparathyroidism of renal origin: Secondary | ICD-10-CM | POA: Diagnosis not present

## 2020-05-23 DIAGNOSIS — N186 End stage renal disease: Secondary | ICD-10-CM | POA: Diagnosis not present

## 2020-05-23 DIAGNOSIS — Z992 Dependence on renal dialysis: Secondary | ICD-10-CM | POA: Diagnosis not present

## 2020-05-23 DIAGNOSIS — N2581 Secondary hyperparathyroidism of renal origin: Secondary | ICD-10-CM | POA: Diagnosis not present

## 2020-05-25 DIAGNOSIS — N186 End stage renal disease: Secondary | ICD-10-CM | POA: Diagnosis not present

## 2020-05-25 DIAGNOSIS — Z992 Dependence on renal dialysis: Secondary | ICD-10-CM | POA: Diagnosis not present

## 2020-05-25 DIAGNOSIS — N2581 Secondary hyperparathyroidism of renal origin: Secondary | ICD-10-CM | POA: Diagnosis not present

## 2020-05-26 DIAGNOSIS — L0390 Cellulitis, unspecified: Secondary | ICD-10-CM | POA: Diagnosis not present

## 2020-05-26 DIAGNOSIS — E1122 Type 2 diabetes mellitus with diabetic chronic kidney disease: Secondary | ICD-10-CM | POA: Diagnosis not present

## 2020-05-26 DIAGNOSIS — N186 End stage renal disease: Secondary | ICD-10-CM | POA: Diagnosis not present

## 2020-05-26 DIAGNOSIS — D638 Anemia in other chronic diseases classified elsewhere: Secondary | ICD-10-CM | POA: Diagnosis not present

## 2020-05-26 DIAGNOSIS — F322 Major depressive disorder, single episode, severe without psychotic features: Secondary | ICD-10-CM | POA: Diagnosis not present

## 2020-05-26 DIAGNOSIS — Z992 Dependence on renal dialysis: Secondary | ICD-10-CM | POA: Diagnosis not present

## 2020-05-26 DIAGNOSIS — I872 Venous insufficiency (chronic) (peripheral): Secondary | ICD-10-CM | POA: Diagnosis not present

## 2020-05-27 ENCOUNTER — Encounter (HOSPITAL_COMMUNITY): Payer: Self-pay

## 2020-05-27 ENCOUNTER — Emergency Department (HOSPITAL_COMMUNITY): Payer: Medicare HMO

## 2020-05-27 ENCOUNTER — Emergency Department (HOSPITAL_COMMUNITY)
Admission: EM | Admit: 2020-05-27 | Discharge: 2020-05-28 | Disposition: A | Discharge status: Home / Self Care | Payer: Medicare HMO | Attending: Emergency Medicine | Admitting: Emergency Medicine

## 2020-05-27 ENCOUNTER — Other Ambulatory Visit: Payer: Self-pay

## 2020-05-27 DIAGNOSIS — Z96653 Presence of artificial knee joint, bilateral: Secondary | ICD-10-CM | POA: Diagnosis not present

## 2020-05-27 DIAGNOSIS — R600 Localized edema: Secondary | ICD-10-CM | POA: Insufficient documentation

## 2020-05-27 DIAGNOSIS — Z79899 Other long term (current) drug therapy: Secondary | ICD-10-CM | POA: Insufficient documentation

## 2020-05-27 DIAGNOSIS — E1122 Type 2 diabetes mellitus with diabetic chronic kidney disease: Secondary | ICD-10-CM | POA: Diagnosis not present

## 2020-05-27 DIAGNOSIS — E1140 Type 2 diabetes mellitus with diabetic neuropathy, unspecified: Secondary | ICD-10-CM | POA: Insufficient documentation

## 2020-05-27 DIAGNOSIS — L988 Other specified disorders of the skin and subcutaneous tissue: Secondary | ICD-10-CM | POA: Diagnosis not present

## 2020-05-27 DIAGNOSIS — R410 Disorientation, unspecified: Secondary | ICD-10-CM | POA: Diagnosis not present

## 2020-05-27 DIAGNOSIS — N2581 Secondary hyperparathyroidism of renal origin: Secondary | ICD-10-CM | POA: Diagnosis not present

## 2020-05-27 DIAGNOSIS — Z96641 Presence of right artificial hip joint: Secondary | ICD-10-CM | POA: Diagnosis not present

## 2020-05-27 DIAGNOSIS — N186 End stage renal disease: Secondary | ICD-10-CM | POA: Diagnosis not present

## 2020-05-27 DIAGNOSIS — E039 Hypothyroidism, unspecified: Secondary | ICD-10-CM | POA: Diagnosis not present

## 2020-05-27 DIAGNOSIS — Z992 Dependence on renal dialysis: Secondary | ICD-10-CM | POA: Diagnosis not present

## 2020-05-27 DIAGNOSIS — R4182 Altered mental status, unspecified: Secondary | ICD-10-CM | POA: Diagnosis not present

## 2020-05-27 DIAGNOSIS — E11319 Type 2 diabetes mellitus with unspecified diabetic retinopathy without macular edema: Secondary | ICD-10-CM | POA: Diagnosis not present

## 2020-05-27 DIAGNOSIS — S81801A Unspecified open wound, right lower leg, initial encounter: Secondary | ICD-10-CM | POA: Diagnosis present

## 2020-05-27 DIAGNOSIS — S80811A Abrasion, right lower leg, initial encounter: Secondary | ICD-10-CM | POA: Diagnosis not present

## 2020-05-27 DIAGNOSIS — I4891 Unspecified atrial fibrillation: Secondary | ICD-10-CM | POA: Diagnosis not present

## 2020-05-27 DIAGNOSIS — X58XXXA Exposure to other specified factors, initial encounter: Secondary | ICD-10-CM | POA: Diagnosis not present

## 2020-05-27 DIAGNOSIS — I132 Hypertensive heart and chronic kidney disease with heart failure and with stage 5 chronic kidney disease, or end stage renal disease: Secondary | ICD-10-CM | POA: Diagnosis not present

## 2020-05-27 DIAGNOSIS — Z20822 Contact with and (suspected) exposure to covid-19: Secondary | ICD-10-CM | POA: Insufficient documentation

## 2020-05-27 DIAGNOSIS — I5043 Acute on chronic combined systolic (congestive) and diastolic (congestive) heart failure: Secondary | ICD-10-CM | POA: Diagnosis not present

## 2020-05-27 DIAGNOSIS — I517 Cardiomegaly: Secondary | ICD-10-CM | POA: Diagnosis not present

## 2020-05-27 LAB — COMPREHENSIVE METABOLIC PANEL
ALT: 20 U/L (ref 0–44)
AST: 33 U/L (ref 15–41)
Albumin: 3.1 g/dL — ABNORMAL LOW (ref 3.5–5.0)
Alkaline Phosphatase: 249 U/L — ABNORMAL HIGH (ref 38–126)
Anion gap: 10 (ref 5–15)
BUN: 14 mg/dL (ref 8–23)
CO2: 30 mmol/L (ref 22–32)
Calcium: 8.7 mg/dL — ABNORMAL LOW (ref 8.9–10.3)
Chloride: 94 mmol/L — ABNORMAL LOW (ref 98–111)
Creatinine, Ser: 3.72 mg/dL — ABNORMAL HIGH (ref 0.61–1.24)
GFR, Estimated: 17 mL/min — ABNORMAL LOW (ref >60)
Glucose, Bld: 105 mg/dL — ABNORMAL HIGH (ref 70–99)
Potassium: 3.7 mmol/L (ref 3.5–5.1)
Sodium: 134 mmol/L — ABNORMAL LOW (ref 135–145)
Total Bilirubin: 0.5 mg/dL (ref 0.3–1.2)
Total Protein: 8.3 g/dL — ABNORMAL HIGH (ref 6.5–8.1)

## 2020-05-27 LAB — CBC
HCT: 36.9 % — ABNORMAL LOW (ref 39.0–52.0)
Hemoglobin: 11 g/dL — ABNORMAL LOW (ref 13.0–17.0)
MCH: 30 pg (ref 26.0–34.0)
MCHC: 29.8 g/dL — ABNORMAL LOW (ref 30.0–36.0)
MCV: 100.5 fL — ABNORMAL HIGH (ref 80.0–100.0)
Platelets: 97 10*3/uL — ABNORMAL LOW (ref 150–400)
RBC: 3.67 MIL/uL — ABNORMAL LOW (ref 4.22–5.81)
RDW: 18 % — ABNORMAL HIGH (ref 11.5–15.5)
WBC: 6.3 10*3/uL (ref 4.0–10.5)
nRBC: 0 % (ref 0.0–0.2)

## 2020-05-27 LAB — CBG MONITORING, ED: Glucose-Capillary: 103 mg/dL — ABNORMAL HIGH (ref 70–99)

## 2020-05-27 LAB — ETHANOL: Alcohol, Ethyl (B): <10 mg/dL (ref <10)

## 2020-05-27 NOTE — ED Provider Notes (Signed)
[place] EMERGENCY DEPARTMENT Provider Note   CSN: [id number] Arrival date & time: 05/27/20  1658     History Chief Complaint  Patient presents with  . Altered Mental Status    Trindon Stimac is a 65 y.o. male past medical history of CHF, diabetes, ESRD on hemodialysis MWF, A. fib, CVA, presenting via EMS from dialysis center for altered mental status.  It is reported that patient has been altered from his baseline, is disoriented to time.  Patient states he has not missed his dialysis this week though is stating today is Wednesday.  He states he completed about 3 hours of dialysis today and his confusion did not improve therefore they sent him here for evaluation.  He does endorse that he has been more tired lately.  He endorses new dry cough that started this week.  Denies fevers or chills.  Denies abdominal pain, nausea/vomiting/diarrhea, fever, sore throat, CP, abd pain.  He has a wound to his right lower leg that is being treated outpatient, states he was seen in the ED recently for this as well.  Has had no known Covid exposures, is vaccinated against COVID.  States he has been taking his medications as directed.  States he does not make any urine. No new medications.  The history is provided by the patient and the EMS personnel.       Past Medical History:  Diagnosis Date  . Allergic rhinitis 07/07/2014  . Anemia due to other cause 07/07/2014  . Arthritis    "all over"  . Atrial fibrillation ([place]) 12/30/2012  . CHF (congestive heart failure) ([place]) 12/30/2012  . Chronic lower back pain   . Diabetes mellitus with neuropathy ([place])   . Diabetic retinopathy ([place]) 12/30/2012   no medications  . Dysrhythmia    Afib  . ESRD (end stage renal disease) on dialysis ([place]) started in 2013   MWF; [facility]; [employer]  . GERD (gastroesophageal reflux disease)   . Glaucoma   . History of gout    "right big toe"  . Hyperlipidemia   . Hypertension   . Hypothyroid     01/17/16- no longer on meduication  . Morbid obesity ([place]) 12/30/2012  . Myocardial infarction ([place])    "I've had ~ 3; last one was in ~ 10/2013" (01/30/2014)  . OSA (obstructive sleep apnea) 12/30/2012   "lost weight; no longer needed CPAP; retested said I needed it; didn't followup cause I was feeling fine" (01/30/2014)  . Pulmonary hypertension ([place]) 12/30/2012  . Refusal of blood transfusions as patient is Jehovah's Witness   . Stroke [facility]) ~ 2005; ~ 2005   "they were mild; I didn't even notice I'd had them"; denies residual on 01/30/2014  . Subdural hematoma (Oct 2018)    Coumadin and heparin placed on hold at dc- pt did not FU w/ NS as recommended after dc so not on warfarin when admitted 04/03/17    Patient Active Problem List   Diagnosis Date Noted  . Hyperphosphatemia 04/29/2020  . Cellulitis of right lower extremity 04/29/2020  . Noncompliance of patient with renal dialysis ([place]) 01/11/2020  . Prolonged QT interval 02/15/2019  . Shock ([place]) 02/24/2018  . Hyperkalemia, diminished renal excretion 01/01/2018  . Syncope and collapse 04/03/2017  . History of CVA (cerebrovascular accident) 04/03/2017  . CKD (chronic kidney disease) stage V requiring chronic dialysis ([place]) 04/03/2017  . Hypothyroid 04/03/2017  . Refusal of blood transfusions as patient is Jehovah's Witness 04/03/2017  . Prediabetes-HgbA1c  6.2 04/03/2017  . History of subdural hematoma Oct 2018 04/03/2017  . 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 Chronic atrial fibrillation 04/03/2017  . Subdural hematoma ([place]) 11/29/2016  . Peripheral neuropathy 07/26/2016  . At risk for adverse drug reaction 07/03/2016  . Primary osteoarthritis of left knee 06/26/2016  . Postoperative anemia due to acute blood loss 01/27/2016  . S/P knee replacement 01/24/2016  . Low back pain 02/09/2015  . Bilateral knee pain 01/27/2015  . Atypical atrial flutter ([place]) 01/24/2015  . Acute on  chronic combined systolic and diastolic heart failure ([place]) 01/05/2015  . Hyperkalemia 11/08/2014  . Hypocalcemia 11/08/2014  . Recurrent falls 10/13/2014  . Venous stasis ulcer ([place]) 10/13/2014  . Hypoxia   . Leukocytosis   . Respiratory failure ([place])   . ARDS (adult respiratory distress syndrome) ([place])   . Encounter for central line placement   . Cardiopulmonary arrest ([place])   . Compression fracture of L3 lumbar vertebra 08/04/2014  . Anemia of chronic renal failure, unspecified stage 07/07/2014  . Allergic rhinitis 07/07/2014  . Anemia of chronic disease 07/07/2014  . Right rotator cuff tear 04/27/2014  . Acute gouty arthritis 11/11/2013  . Hearing loss in right ear 11/11/2013  . Chronic pain syndrome 11/11/2013  . Right shoulder pain 11/11/2013  . Facial rash 11/01/2013  . Right otitis externa 10/27/2013  . Pulmonary HTN- PA 58 mmHg 04/16/13 09/17/2013  . Right hip pain 08/04/2013  . DOE (dyspnea on exertion) 06/18/2013  . Allergic rhinitis, cause unspecified 06/18/2013  . Carpal tunnel syndrome 06/06/2013  . Primary localized osteoarthrosis, lower leg 06/04/2013  . Preoperative cardiovascular examination 03/10/2013  . Preventative health care 12/30/2012  . AF (paroxysmal atrial fibrillation) ([place]) 12/30/2012  . CHF (congestive heart failure) ([place]) 12/30/2012  . Diabetic retinopathy ([place]) 12/30/2012  . Morbid obesity ([place]) 12/30/2012  . OSA (obstructive sleep apnea)- on C-pap 12/30/2012  . Bilateral hearing loss 12/30/2012  . Arthritis   . Glaucoma   . Diabetes mellitus with neuropathy ([place])   . Hypertension   . ESRD (end stage renal disease) on dialysis ([place])   . Stroke ([place])   . Hyperlipidemia     Past Surgical History:  Procedure Laterality Date  . AV FISTULA PLACEMENT Left ~ 10/2013   "forearm"  . CARDIAC CATHETERIZATION N/A 01/07/2015   Procedure: Left Heart Cath and Coronary Angiography;  Surgeon: Troy Sine, MD;  Location: [facility];  Service:  Cardiovascular;  Laterality: N/A;  . CLOSED REDUCTION HIP DISLOCATION Right 1970's  . JOINT REPLACEMENT    . TOTAL HIP ARTHROPLASTY Right 1980  . TOTAL KNEE ARTHROPLASTY Right 01/24/2016   Procedure: TOTAL KNEE ARTHROPLASTY;  Surgeon: Dorna Leitz, MD;  Location: [facility];  Service: Orthopedics;  Laterality: Right;  . TOTAL KNEE ARTHROPLASTY Left 06/26/2016   Procedure: TOTAL KNEE ARTHROPLASTY;  Surgeon: Dorna Leitz, MD;  Location: [place];  Service: Orthopedics;  Laterality: Left;       Family History  Problem Relation Age of Onset  . Arthritis Other   . Heart disease Other   . Hyperlipidemia Other   . Hypertension Other   . Kidney disease Other   . Diabetes Other   . Stroke Other   . Diabetes Mother   . Cancer Father        bone cancer  . Heart disease Father   . Diabetes Sister   . Diabetes Brother   . Stroke Brother  Social History   Tobacco Use  . Smoking status: Never Smoker  . Smokeless tobacco: Never Used  Vaping Use  . Vaping Use: Never used  Substance Use Topics  . Alcohol use: Not Currently    Alcohol/week: 7.0 - 8.0 standard drinks    Types: 1 Cans of beer, 3 Shots of liquor, 3 - 4 Standard drinks or equivalent per week  . Drug use: No    Home Medications Prior to Admission medications   Medication Sig Start Date End Date Taking? Authorizing Provider  amitriptyline (ELAVIL) 50 MG tablet Take 1 tablet (50 mg total) by mouth at bedtime. 02/28/18  Yes Rai, Ripudeep K, MD  amLODipine (NORVASC) 5 MG tablet Take 1 tablet (5 mg total) by mouth at bedtime. 05/10/20  Yes Dwyane Dee, MD  AURYXIA 1 GM 210 MG(Fe) tablet Take 210 mg by mouth 3 (three) times daily with meals. 11/27/19  Yes [provider]  b complex-vitamin c-folic acid (NEPHRO-VITE) 0.8 MG TABS tablet Take 1 tablet by mouth daily.   Yes [provider]  busPIRone (BUSPAR) 5 MG tablet Take 1 tablet (5 mg total) by mouth 2 (two) times daily. 05/10/20  Yes Dwyane Dee, MD  cinacalcet  (SENSIPAR) 30 MG tablet Take 30 mg by mouth daily with supper. 12/23/17  Yes [provider]  gabapentin (NEURONTIN) 100 MG capsule Take 2 capsules (200 mg total) by mouth at bedtime. 200 mg qam and 300 mg upon return from dialysis on M-W-F for neuropathy Patient taking differently: Take 100 mg by mouth at bedtime. 07/31/16  Yes Medina-Vargas, Monina C, NP  simvastatin (ZOCOR) 20 MG tablet Take 1 tablet (20 mg total) by mouth daily. 01/12/20  Yes Kayleen Memos, DO    Allergies    Tobacco [tobacco] and Other  Review of Systems   Review of Systems  Constitutional: Positive for fatigue.  Respiratory: Positive for cough.   Psychiatric/Behavioral: Positive for confusion.  All other systems reviewed and are negative.   Physical Exam Updated Vital Signs BP 131/75 (BP Location: Right Arm)   Pulse 78   Temp 99.8 F (37.7 C) (Oral)   Resp 18   SpO2 98%   Physical Exam Vitals and nursing note reviewed.  Constitutional:      General: He is not in acute distress.    Appearance: He is well-developed.  HENT:     Head: Normocephalic and atraumatic.  Eyes:     Conjunctiva/sclera: Conjunctivae normal.  Cardiovascular:     Rate and Rhythm: Normal rate. Rhythm irregular.     Comments: Left distal forearm fistula with palpable thrill.  Not actively bleeding. Pulmonary:     Effort: Pulmonary effort is normal. No respiratory distress.     Breath sounds: Normal breath sounds.  Abdominal:     General: Bowel sounds are normal.     Palpations: Abdomen is soft.     Tenderness: There is no abdominal tenderness.  Musculoskeletal:     Right lower leg: Edema present.     Left lower leg: Edema present.  Skin:    General: Skin is warm.     Comments: Right lower leg with pitting edema, abrasion to the medial calf without significant surrounding erythema or drainage.  Neurological:     Mental Status: He is alert.     Comments: Patient is oriented to person and place, month, though states  year is  2021, is able to accurately state the president. He is able to follow simple commands without  difficulty.  Strength is equal bilateral upper and lower extremities.  EOMs are intact, PERRL.  No facial droop or slurred speech.  Psychiatric:        Behavior: Behavior normal.     ED Results / Procedures / Treatments   Labs (all labs ordered are listed, but only abnormal results are displayed) Labs Reviewed  COMPREHENSIVE METABOLIC PANEL - Abnormal; Notable for the following components:      Result Value   Sodium 134 (*)    Chloride 94 (*)    Glucose, Bld 105 (*)    Creatinine, Ser 3.72 (*)    Calcium 8.7 (*)    Total Protein 8.3 (*)    Albumin 3.1 (*)    Alkaline Phosphatase 249 (*)    GFR, Estimated 17 (*)    All other components within normal limits  CBC - Abnormal; Notable for the following components:   RBC 3.67 (*)    Hemoglobin 11.0 (*)    HCT 36.9 (*)    MCV 100.5 (*)    MCHC 29.8 (*)    RDW 18.0 (*)    Platelets 97 (*)    All other components within normal limits  CBG MONITORING, ED - Abnormal; Notable for the following components:   Glucose-Capillary 103 (*)    All other components within normal limits  RESP PANEL BY RT-PCR (FLU A&B, COVID) ARPGX2  ETHANOL  CBG MONITORING, ED    EKG EKG Interpretation  Date/Time:  Friday May 27 2020 17:12:41 EDT Ventricular Rate:  74 PR Interval:    QRS Duration: 168 QT Interval:  456 QTC Calculation: 506 R Axis:   243 Text Interpretation: Atrial fibrillation RBBB and LAFB Confirmed by Lavenia Atlas [phone]) on 05/27/2020 5:19:59 PM   Radiology CT Head Wo Contrast  Result Date: 05/27/2020 CLINICAL DATA:  Altered mental status EXAM: CT HEAD WITHOUT CONTRAST TECHNIQUE: Contiguous axial images were obtained from the base of the skull through the vertex without intravenous contrast. COMPARISON:  04/03/2017 FINDINGS: Brain: There is atrophy and chronic small vessel disease changes. No acute intracranial abnormality.  Specifically, no hemorrhage, hydrocephalus, mass lesion, acute infarction, or significant intracranial injury. Vascular: No hyperdense vessel or unexpected calcification. Skull: No acute calvarial abnormality. Sinuses/Orbits: No acute findings. Mucosal thickening throughout the paranasal sinuses. Postoperative changes. Other: None IMPRESSION: Atrophy, chronic microvascular disease. No acute intracranial abnormality. Electronically Signed   By: Rolm Baptise M.D.   On: 05/27/2020 22:54   DG Chest Port 1 View  Result Date: 05/27/2020 CLINICAL DATA:  Altered mental status EXAM: PORTABLE CHEST 1 VIEW COMPARISON:  04/29/2020 FINDINGS: Cardiomegaly, vascular congestion. Diffuse interstitial prominence may reflect interstitial edema. Suspect small layering effusions. No acute bony abnormality. IMPRESSION: Mild interstitial edema/CHF.  Suspect small layering effusions. Electronically Signed   By: Rolm Baptise M.D.   On: 05/27/2020 17:29    Procedures Procedures   Medications Ordered in ED Medications - No data to display  ED Course  I have reviewed the triage vital signs and the nursing notes.  Pertinent labs & imaging results that were available during my care of the patient were reviewed by me and considered in my medical decision making (see chart for details).    MDM Rules/Calculators/A&P                          Patient is a 65 year old male presenting from dialysis center for change in mental status.  Patient however is able to provide detailed  history, with the exception of stating the year of 2021.  Patient remains appropriate on reevaluations.  He is able to report his dialysis center, describe his nephrologist, also provide accurate recent history.  States it is usual for him to be unsure of the year.  His work-up thus far is unremarkable including, normal BUN, no leukocytosis, negative ethanol, normal CBG, chest x-ray is clear.  Overall very unsuccessful with contacting any family members  after numerous attempts.  Discussed with and evaluated by attending physician Dr. Fulton Reek.  Recommend patient is likely appropriate for discharge to home considering mental status remains stable if head CT is negative. Believe patient is appropriate for discharge at this time, displays overall appropriate mentation through ED course and workup is overall unremarkable.  Patient is discharged in no distress. Final Clinical Impression(s) / ED Diagnoses Final diagnoses:  Disorientation    Rx / DC Orders ED Discharge Orders    None       Vonnetta Akey, [place] N, PA-C 05/28/20 0010    Lorelle Gibbs, DO 05/30/20 2042

## 2020-05-27 NOTE — ED Triage Notes (Signed)
Pt bib [facility] from dialysis. EMS was called  due to an altered mental status from baseline when pt arrived to dialysis.Disoriented to person & place. pt stated he missed dialysis last week.   SPO2 94% 165/68 Pulse 86 RR 18  CBG 113

## 2020-05-27 NOTE — ED Provider Notes (Incomplete)
[facility] EMERGENCY DEPARTMENT Provider Note   CSN: [id number] Arrival date & time: 05/27/20  1658     History Chief Complaint  Patient presents with  . Altered Mental Status    Daniel Whitney is a 65 y.o. male past medical history of CHF, diabetes, ESRD on hemodialysis MWF, A. fib, CVA, presenting via EMS from dialysis center for altered mental status.  It is reported that patient has been altered from his baseline, is disoriented to time.  Patient states he has not missed his dialysis this week though is stating today is Wednesday.  He states he completed about 3 hours of dialysis today and his confusion did not improve therefore they sent him here for evaluation.  He does endorse that he has been more tired lately.  He endorses new dry cough that started this week.  Denies fevers or chills.  Denies abdominal pain, nausea/vomiting/diarrhea, fever, sore throat, CP, abd pain.  He has a wound to his right lower leg that is being treated outpatient, states he was seen in the ED recently for this as well.  Has had no known Covid exposures, is vaccinated against COVID.  States he has been taking his medications as directed.  States he does not make any urine.  The history is provided by the patient and the EMS personnel.       Past Medical History:  Diagnosis Date  . Allergic rhinitis 07/07/2014  . Anemia due to other cause 07/07/2014  . Arthritis    "all over"  . Atrial fibrillation ([place]) 12/30/2012  . CHF (congestive heart failure) ([place]) 12/30/2012  . Chronic lower back pain   . Diabetes mellitus with neuropathy ([place])   . Diabetic retinopathy ([place]) 12/30/2012   no medications  . Dysrhythmia    Afib  . ESRD (end stage renal disease) on dialysis ([place]) started in 2013   MWF; [facility]; [employer]  . GERD (gastroesophageal reflux disease)   . Glaucoma   . History of gout    "right big toe"  . Hyperlipidemia   . Hypertension   . Hypothyroid    01/17/16- no  longer on meduication  . Morbid obesity ([place]) 12/30/2012  . Myocardial infarction ([place])    "I've had ~ 3; last one was in ~ 10/2013" (01/30/2014)  . OSA (obstructive sleep apnea) 12/30/2012   "lost weight; no longer needed CPAP; retested said I needed it; didn't followup cause I was feeling fine" (01/30/2014)  . Pulmonary hypertension ([place]) 12/30/2012  . Refusal of blood transfusions as patient is Jehovah's Witness   . Stroke [facility]) ~ 2005; ~ 2005   "they were mild; I didn't even notice I'd had them"; denies residual on 01/30/2014  . Subdural hematoma (Oct 2018)    Coumadin and heparin placed on hold at dc- pt did not FU w/ NS as recommended after dc so not on warfarin when admitted 04/03/17    Patient Active Problem List   Diagnosis Date Noted  . Hyperphosphatemia 04/29/2020  . Cellulitis of right lower extremity 04/29/2020  . Noncompliance of patient with renal dialysis ([place]) 01/11/2020  . Prolonged QT interval 02/15/2019  . Shock ([place]) 02/24/2018  . Hyperkalemia, diminished renal excretion 01/01/2018  . Syncope and collapse 04/03/2017  . History of CVA (cerebrovascular accident) 04/03/2017  . CKD (chronic kidney disease) stage V requiring chronic dialysis ([place]) 04/03/2017  . Hypothyroid 04/03/2017  . Refusal of blood transfusions as patient is Jehovah's Witness 04/03/2017  . Prediabetes-HgbA1c 6.2 04/03/2017  .  History of subdural hematoma Oct 2018 04/03/2017  . 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 Chronic atrial fibrillation 04/03/2017  . Subdural hematoma ([place]) 11/29/2016  . Peripheral neuropathy 07/26/2016  . At risk for adverse drug reaction 07/03/2016  . Primary osteoarthritis of left knee 06/26/2016  . Postoperative anemia due to acute blood loss 01/27/2016  . S/P knee replacement 01/24/2016  . Low back pain 02/09/2015  . Bilateral knee pain 01/27/2015  . Atypical atrial flutter ([place]) 01/24/2015  . Acute on chronic combined  systolic and diastolic heart failure ([place]) 01/05/2015  . Hyperkalemia 11/08/2014  . Hypocalcemia 11/08/2014  . Recurrent falls 10/13/2014  . Venous stasis ulcer ([place]) 10/13/2014  . Hypoxia   . Leukocytosis   . Respiratory failure ([place])   . ARDS (adult respiratory distress syndrome) ([place])   . Encounter for central line placement   . Cardiopulmonary arrest ([place])   . Compression fracture of L3 lumbar vertebra 08/04/2014  . Anemia of chronic renal failure, unspecified stage 07/07/2014  . Allergic rhinitis 07/07/2014  . Anemia of chronic disease 07/07/2014  . Right rotator cuff tear 04/27/2014  . Acute gouty arthritis 11/11/2013  . Hearing loss in right ear 11/11/2013  . Chronic pain syndrome 11/11/2013  . Right shoulder pain 11/11/2013  . Facial rash 11/01/2013  . Right otitis externa 10/27/2013  . Pulmonary HTN- PA 58 mmHg 04/16/13 09/17/2013  . Right hip pain 08/04/2013  . DOE (dyspnea on exertion) 06/18/2013  . Allergic rhinitis, cause unspecified 06/18/2013  . Carpal tunnel syndrome 06/06/2013  . Primary localized osteoarthrosis, lower leg 06/04/2013  . Preoperative cardiovascular examination 03/10/2013  . Preventative health care 12/30/2012  . AF (paroxysmal atrial fibrillation) ([place]) 12/30/2012  . CHF (congestive heart failure) ([place]) 12/30/2012  . Diabetic retinopathy ([place]) 12/30/2012  . Morbid obesity ([place]) 12/30/2012  . OSA (obstructive sleep apnea)- on C-pap 12/30/2012  . Bilateral hearing loss 12/30/2012  . Arthritis   . Glaucoma   . Diabetes mellitus with neuropathy ([place])   . Hypertension   . ESRD (end stage renal disease) on dialysis ([place])   . Stroke ([place])   . Hyperlipidemia     Past Surgical History:  Procedure Laterality Date  . AV FISTULA PLACEMENT Left ~ 10/2013   "forearm"  . CARDIAC CATHETERIZATION N/A 01/07/2015   Procedure: Left Heart Cath and Coronary Angiography;  Surgeon: Troy Sine, MD;  Location: [facility];  Service: Cardiovascular;   Laterality: N/A;  . CLOSED REDUCTION HIP DISLOCATION Right 1970's  . JOINT REPLACEMENT    . TOTAL HIP ARTHROPLASTY Right 1980  . TOTAL KNEE ARTHROPLASTY Right 01/24/2016   Procedure: TOTAL KNEE ARTHROPLASTY;  Surgeon: Dorna Leitz, MD;  Location: [facility];  Service: Orthopedics;  Laterality: Right;  . TOTAL KNEE ARTHROPLASTY Left 06/26/2016   Procedure: TOTAL KNEE ARTHROPLASTY;  Surgeon: Dorna Leitz, MD;  Location: [facility];  Service: Orthopedics;  Laterality: Left;       Family History  Problem Relation Age of Onset  . Arthritis Other   . Heart disease Other   . Hyperlipidemia Other   . Hypertension Other   . Kidney disease Other   . Diabetes Other   . Stroke Other   . Diabetes Mother   . Cancer Father        bone cancer  . Heart disease Father   . Diabetes Sister   . Diabetes Brother   . Stroke Brother     Social History  Tobacco Use  . Smoking status: Never Smoker  . Smokeless tobacco: Never Used  Vaping Use  . Vaping Use: Never used  Substance Use Topics  . Alcohol use: Not Currently    Alcohol/week: 7.0 - 8.0 standard drinks    Types: 1 Cans of beer, 3 Shots of liquor, 3 - 4 Standard drinks or equivalent per week  . Drug use: No    Home Medications Prior to Admission medications   Medication Sig Start Date End Date Taking? Authorizing Provider  amitriptyline (ELAVIL) 50 MG tablet Take 1 tablet (50 mg total) by mouth at bedtime. 02/28/18   Rai, Ripudeep K, MD  amLODipine (NORVASC) 5 MG tablet Take 1 tablet (5 mg total) by mouth at bedtime. 05/10/20   Dwyane Dee, MD  AURYXIA 1 GM 210 MG(Fe) tablet Take 210 mg by mouth 3 (three) times daily with meals. 11/27/19   [provider]  b complex-vitamin c-folic acid (NEPHRO-VITE) 0.8 MG TABS tablet Take 1 tablet by mouth daily.    [provider]  busPIRone (BUSPAR) 5 MG tablet Take 1 tablet (5 mg total) by mouth 2 (two) times daily. 05/10/20   Dwyane Dee, MD  cinacalcet (SENSIPAR) 30 MG tablet Take 30  mg by mouth daily with supper. 12/23/17   [provider]  gabapentin (NEURONTIN) 100 MG capsule Take 2 capsules (200 mg total) by mouth at bedtime. 200 mg qam and 300 mg upon return from dialysis on M-W-F for neuropathy Patient taking differently: Take 100 mg by mouth at bedtime. 07/31/16   Medina-Vargas, Monina C, NP  simvastatin (ZOCOR) 20 MG tablet Take 1 tablet (20 mg total) by mouth daily. 01/12/20   Kayleen Memos, DO    Allergies    Tobacco [tobacco] and Other  Review of Systems   Review of Systems  Constitutional: Positive for fatigue.  Respiratory: Positive for cough.   Psychiatric/Behavioral: Positive for confusion.  All other systems reviewed and are negative.   Physical Exam Updated Vital Signs BP (!) 160/75   Pulse 83   Temp 98.6 F (37 C) (Oral)   Resp (!) 21   SpO2 99%   Physical Exam Vitals and nursing note reviewed.  Constitutional:      General: He is not in acute distress.    Appearance: He is well-developed.  HENT:     Head: Normocephalic and atraumatic.  Eyes:     Conjunctiva/sclera: Conjunctivae normal.  Cardiovascular:     Rate and Rhythm: Normal rate. Rhythm irregular.     Comments: Left distal forearm fistula with palpable thrill.  Not actively bleeding. Pulmonary:     Effort: Pulmonary effort is normal. No respiratory distress.     Breath sounds: Normal breath sounds.  Abdominal:     General: Bowel sounds are normal.     Palpations: Abdomen is soft.     Tenderness: There is no abdominal tenderness.  Musculoskeletal:     Right lower leg: Edema present.     Left lower leg: Edema present.  Skin:    General: Skin is warm.     Comments: Right lower leg with pitting edema, abrasion to the medial calf without significant surrounding erythema or drainage.  Neurological:     Mental Status: He is alert.     Comments: Patient is oriented to person and place, month, though states year is  2021, is able to accurately state the president. He  is able to follow simple commands without difficulty.  Strength is equal bilateral  upper and lower extremities.  EOMs are intact, PERRL.  No facial droop or slurred speech.  Psychiatric:        Behavior: Behavior normal.     ED Results / Procedures / Treatments   Labs (all labs ordered are listed, but only abnormal results are displayed) Labs Reviewed  COMPREHENSIVE METABOLIC PANEL - Abnormal; Notable for the following components:      Result Value   Sodium 134 (*)    Chloride 94 (*)    Glucose, Bld 105 (*)    Creatinine, Ser 3.72 (*)    Calcium 8.7 (*)    Total Protein 8.3 (*)    Albumin 3.1 (*)    Alkaline Phosphatase 249 (*)    GFR, Estimated 17 (*)    All other components within normal limits  CBC - Abnormal; Notable for the following components:   RBC 3.67 (*)    Hemoglobin 11.0 (*)    HCT 36.9 (*)    MCV 100.5 (*)    MCHC 29.8 (*)    RDW 18.0 (*)    Platelets 97 (*)    All other components within normal limits  CBG MONITORING, ED - Abnormal; Notable for the following components:   Glucose-Capillary 103 (*)    All other components within normal limits  RESP PANEL BY RT-PCR (FLU A&B, COVID) ARPGX2  ETHANOL  CBG MONITORING, ED    EKG EKG Interpretation  Date/Time:  Friday May 27 2020 17:12:41 EDT Ventricular Rate:  74 PR Interval:    QRS Duration: 168 QT Interval:  456 QTC Calculation: 506 R Axis:   243 Text Interpretation: Atrial fibrillation RBBB and LAFB Confirmed by Lavenia Atlas [phone]) on 05/27/2020 5:19:59 PM   Radiology DG Chest Port 1 View  Result Date: 05/27/2020 CLINICAL DATA:  Altered mental status EXAM: PORTABLE CHEST 1 VIEW COMPARISON:  04/29/2020 FINDINGS: Cardiomegaly, vascular congestion. Diffuse interstitial prominence may reflect interstitial edema. Suspect small layering effusions. No acute bony abnormality. IMPRESSION: Mild interstitial edema/CHF.  Suspect small layering effusions. Electronically Signed   By: Rolm Baptise M.D.   On:  05/27/2020 17:29    Procedures Procedures {Remember to document critical care time when appropriate:1}  Medications Ordered in ED Medications - No data to display  ED Course  I have reviewed the triage vital signs and the nursing notes.  Pertinent labs & imaging results that were available during my care of the patient were reviewed by me and considered in my medical decision making (see chart for details).    MDM Rules/Calculators/A&P                          Patient is a 65 year old male presenting from dialysis center for change in mental status.  Patient however is able to provide detailed history, with the exception of stating the year of 2021.  Patient remains appropriate on reevaluations.  He is able to report his dialysis center, describe his nephrologist, also provide accurate recent history.  States it is usual for him to be unsure of the year.  His work-up thus far is unremarkable including, normal BUN, no leukocytosis, negative ethanol, normal CBG, chest x-ray is clear.  Overall very unsuccessful with contacting any family members after numerous attempts.  Discussed with and evaluated by attending physician Dr. Fulton Reek.  Recommend patient is likely appropriate for discharge to home considering mental status remains stable if head CT is negative. Believe patient is appropriate for discharge at this  time, displays overall appropriate mentation through ED course and workup is overall unremarkable.  Final Clinical Impression(s) / ED Diagnoses Final diagnoses:  None    Rx / DC Orders ED Discharge Orders    None

## 2020-05-27 NOTE — ED Notes (Signed)
Daughter Lyfe Fitzke [phone] would like an update when possible

## 2020-05-28 LAB — RESP PANEL BY RT-PCR (FLU A&B, COVID) ARPGX2
Influenza A by PCR: NEGATIVE
Influenza B by PCR: NEGATIVE
SARS Coronavirus 2 by RT PCR: NEGATIVE

## 2020-05-28 NOTE — ED Notes (Signed)
[facility] called twice for report. Front desk transfers the call to nursing station but no answer at this time. Will attempt again. Pt is going back to facility with [facility]. Waiting for [facility] at this time.

## 2020-05-28 NOTE — ED Notes (Signed)
Attempted report to [facility] x 2

## 2020-05-28 NOTE — ED Notes (Signed)
[facility] at bedside. DC instructions given to EMS.

## 2020-05-28 NOTE — Discharge Instructions (Signed)
Please follow closely with your primary care provider regarding your visit today. Your blood work and head CT are reassuring today. It is important that you attend your dialysis on Monday. Return as needed.

## 2020-05-28 NOTE — ED Notes (Signed)
Pt resting in bed.

## 2020-05-28 NOTE — ED Notes (Signed)
[facility] called  

## 2020-05-30 DIAGNOSIS — N186 End stage renal disease: Secondary | ICD-10-CM | POA: Diagnosis not present

## 2020-05-30 DIAGNOSIS — N2581 Secondary hyperparathyroidism of renal origin: Secondary | ICD-10-CM | POA: Diagnosis not present

## 2020-05-30 DIAGNOSIS — Z992 Dependence on renal dialysis: Secondary | ICD-10-CM | POA: Diagnosis not present

## 2020-06-01 DIAGNOSIS — N2581 Secondary hyperparathyroidism of renal origin: Secondary | ICD-10-CM | POA: Diagnosis not present

## 2020-06-01 DIAGNOSIS — Z992 Dependence on renal dialysis: Secondary | ICD-10-CM | POA: Diagnosis not present

## 2020-06-01 DIAGNOSIS — N186 End stage renal disease: Secondary | ICD-10-CM | POA: Diagnosis not present

## 2020-06-03 DIAGNOSIS — N2581 Secondary hyperparathyroidism of renal origin: Secondary | ICD-10-CM | POA: Diagnosis not present

## 2020-06-03 DIAGNOSIS — N186 End stage renal disease: Secondary | ICD-10-CM | POA: Diagnosis not present

## 2020-06-03 DIAGNOSIS — Z992 Dependence on renal dialysis: Secondary | ICD-10-CM | POA: Diagnosis not present

## 2020-06-06 DIAGNOSIS — N186 End stage renal disease: Secondary | ICD-10-CM | POA: Diagnosis not present

## 2020-06-06 DIAGNOSIS — N2581 Secondary hyperparathyroidism of renal origin: Secondary | ICD-10-CM | POA: Diagnosis not present

## 2020-06-06 DIAGNOSIS — Z992 Dependence on renal dialysis: Secondary | ICD-10-CM | POA: Diagnosis not present

## 2020-06-08 DIAGNOSIS — N186 End stage renal disease: Secondary | ICD-10-CM | POA: Diagnosis not present

## 2020-06-08 DIAGNOSIS — N2581 Secondary hyperparathyroidism of renal origin: Secondary | ICD-10-CM | POA: Diagnosis not present

## 2020-06-08 DIAGNOSIS — Z992 Dependence on renal dialysis: Secondary | ICD-10-CM | POA: Diagnosis not present

## 2020-06-10 DIAGNOSIS — N186 End stage renal disease: Secondary | ICD-10-CM | POA: Diagnosis not present

## 2020-06-10 DIAGNOSIS — Z992 Dependence on renal dialysis: Secondary | ICD-10-CM | POA: Diagnosis not present

## 2020-06-10 DIAGNOSIS — N2581 Secondary hyperparathyroidism of renal origin: Secondary | ICD-10-CM | POA: Diagnosis not present

## 2020-06-11 DIAGNOSIS — N186 End stage renal disease: Secondary | ICD-10-CM | POA: Diagnosis not present

## 2020-06-11 DIAGNOSIS — I120 Hypertensive chronic kidney disease with stage 5 chronic kidney disease or end stage renal disease: Secondary | ICD-10-CM | POA: Diagnosis not present

## 2020-06-11 DIAGNOSIS — Z992 Dependence on renal dialysis: Secondary | ICD-10-CM | POA: Diagnosis not present

## 2020-06-12 ENCOUNTER — Emergency Department (HOSPITAL_COMMUNITY): Payer: Medicare HMO

## 2020-06-12 ENCOUNTER — Other Ambulatory Visit: Payer: Self-pay

## 2020-06-12 ENCOUNTER — Emergency Department (HOSPITAL_COMMUNITY)
Admission: EM | Admit: 2020-06-12 | Discharge: 2020-06-12 | Disposition: A | Discharge status: Home / Self Care | Payer: Medicare HMO | Attending: Emergency Medicine | Admitting: Emergency Medicine

## 2020-06-12 DIAGNOSIS — I491 Atrial premature depolarization: Secondary | ICD-10-CM | POA: Diagnosis not present

## 2020-06-12 DIAGNOSIS — R531 Weakness: Secondary | ICD-10-CM | POA: Insufficient documentation

## 2020-06-12 DIAGNOSIS — I5043 Acute on chronic combined systolic (congestive) and diastolic (congestive) heart failure: Secondary | ICD-10-CM | POA: Diagnosis not present

## 2020-06-12 DIAGNOSIS — Z20822 Contact with and (suspected) exposure to covid-19: Secondary | ICD-10-CM | POA: Diagnosis not present

## 2020-06-12 DIAGNOSIS — Z79899 Other long term (current) drug therapy: Secondary | ICD-10-CM | POA: Diagnosis not present

## 2020-06-12 DIAGNOSIS — R400 Somnolence: Secondary | ICD-10-CM

## 2020-06-12 DIAGNOSIS — R5383 Other fatigue: Secondary | ICD-10-CM | POA: Diagnosis not present

## 2020-06-12 DIAGNOSIS — E039 Hypothyroidism, unspecified: Secondary | ICD-10-CM | POA: Diagnosis not present

## 2020-06-12 DIAGNOSIS — R410 Disorientation, unspecified: Secondary | ICD-10-CM | POA: Diagnosis not present

## 2020-06-12 DIAGNOSIS — Z96641 Presence of right artificial hip joint: Secondary | ICD-10-CM | POA: Insufficient documentation

## 2020-06-12 DIAGNOSIS — R5381 Other malaise: Secondary | ICD-10-CM | POA: Diagnosis not present

## 2020-06-12 DIAGNOSIS — I517 Cardiomegaly: Secondary | ICD-10-CM | POA: Diagnosis not present

## 2020-06-12 DIAGNOSIS — I132 Hypertensive heart and chronic kidney disease with heart failure and with stage 5 chronic kidney disease, or end stage renal disease: Secondary | ICD-10-CM | POA: Insufficient documentation

## 2020-06-12 DIAGNOSIS — N186 End stage renal disease: Secondary | ICD-10-CM | POA: Insufficient documentation

## 2020-06-12 DIAGNOSIS — I10 Essential (primary) hypertension: Secondary | ICD-10-CM | POA: Diagnosis not present

## 2020-06-12 DIAGNOSIS — L988 Other specified disorders of the skin and subcutaneous tissue: Secondary | ICD-10-CM | POA: Insufficient documentation

## 2020-06-12 DIAGNOSIS — M2550 Pain in unspecified joint: Secondary | ICD-10-CM | POA: Diagnosis not present

## 2020-06-12 DIAGNOSIS — Z7401 Bed confinement status: Secondary | ICD-10-CM | POA: Diagnosis not present

## 2020-06-12 DIAGNOSIS — Z96653 Presence of artificial knee joint, bilateral: Secondary | ICD-10-CM | POA: Diagnosis not present

## 2020-06-12 DIAGNOSIS — Z992 Dependence on renal dialysis: Secondary | ICD-10-CM | POA: Diagnosis not present

## 2020-06-12 DIAGNOSIS — R0902 Hypoxemia: Secondary | ICD-10-CM | POA: Diagnosis not present

## 2020-06-12 DIAGNOSIS — J811 Chronic pulmonary edema: Secondary | ICD-10-CM | POA: Diagnosis not present

## 2020-06-12 LAB — CBC WITH DIFFERENTIAL/PLATELET
Abs Immature Granulocytes: 0.01 10*3/uL (ref 0.00–0.07)
Basophils Absolute: 0.1 10*3/uL (ref 0.0–0.1)
Basophils Relative: 1 %
Eosinophils Absolute: 0.6 10*3/uL — ABNORMAL HIGH (ref 0.0–0.5)
Eosinophils Relative: 9 %
HCT: 35.9 % — ABNORMAL LOW (ref 39.0–52.0)
Hemoglobin: 10.6 g/dL — ABNORMAL LOW (ref 13.0–17.0)
Immature Granulocytes: 0 %
Lymphocytes Relative: 8 %
Lymphs Abs: 0.5 10*3/uL — ABNORMAL LOW (ref 0.7–4.0)
MCH: 29.7 pg (ref 26.0–34.0)
MCHC: 29.5 g/dL — ABNORMAL LOW (ref 30.0–36.0)
MCV: 100.6 fL — ABNORMAL HIGH (ref 80.0–100.0)
Monocytes Absolute: 0.7 10*3/uL (ref 0.1–1.0)
Monocytes Relative: 12 %
Neutro Abs: 4.1 10*3/uL (ref 1.7–7.7)
Neutrophils Relative %: 70 %
Platelets: 153 10*3/uL (ref 150–400)
RBC: 3.57 MIL/uL — ABNORMAL LOW (ref 4.22–5.81)
RDW: 17 % — ABNORMAL HIGH (ref 11.5–15.5)
WBC: 6 10*3/uL (ref 4.0–10.5)
nRBC: 0 % (ref 0.0–0.2)

## 2020-06-12 LAB — LACTIC ACID, PLASMA: Lactic Acid, Venous: 1.1 mmol/L (ref 0.5–1.9)

## 2020-06-12 LAB — COMPREHENSIVE METABOLIC PANEL
ALT: 18 U/L (ref 0–44)
AST: 24 U/L (ref 15–41)
Albumin: 2.9 g/dL — ABNORMAL LOW (ref 3.5–5.0)
Alkaline Phosphatase: 226 U/L — ABNORMAL HIGH (ref 38–126)
Anion gap: 9 (ref 5–15)
BUN: 30 mg/dL — ABNORMAL HIGH (ref 8–23)
CO2: 30 mmol/L (ref 22–32)
Calcium: 8.9 mg/dL (ref 8.9–10.3)
Chloride: 92 mmol/L — ABNORMAL LOW (ref 98–111)
Creatinine, Ser: 6.42 mg/dL — ABNORMAL HIGH (ref 0.61–1.24)
GFR, Estimated: 9 mL/min — ABNORMAL LOW (ref >60)
Glucose, Bld: 160 mg/dL — ABNORMAL HIGH (ref 70–99)
Potassium: 3.5 mmol/L (ref 3.5–5.1)
Sodium: 131 mmol/L — ABNORMAL LOW (ref 135–145)
Total Bilirubin: 0.5 mg/dL (ref 0.3–1.2)
Total Protein: 8 g/dL (ref 6.5–8.1)

## 2020-06-12 LAB — PROTIME-INR
INR: 1.2 (ref 0.8–1.2)
Prothrombin Time: 15 seconds (ref 11.4–15.2)

## 2020-06-12 LAB — BLOOD GAS, VENOUS
Acid-Base Excess: 4.8 mmol/L — ABNORMAL HIGH (ref 0.0–2.0)
Bicarbonate: 32.3 mmol/L — ABNORMAL HIGH (ref 20.0–28.0)
O2 Saturation: 34.5 %
Patient temperature: 98.6
pCO2, Ven: 67.4 mmHg — ABNORMAL HIGH (ref 44.0–60.0)
pH, Ven: 7.302 (ref 7.250–7.430)
pO2, Ven: <31 mmHg — CL (ref 32.0–45.0)

## 2020-06-12 LAB — RESP PANEL BY RT-PCR (FLU A&B, COVID) ARPGX2
Influenza A by PCR: NEGATIVE
Influenza B by PCR: NEGATIVE
SARS Coronavirus 2 by RT PCR: NEGATIVE

## 2020-06-12 LAB — AMMONIA: Ammonia: 20 umol/L (ref 9–35)

## 2020-06-12 LAB — BRAIN NATRIURETIC PEPTIDE: B Natriuretic Peptide: 1010.4 pg/mL — ABNORMAL HIGH (ref 0.0–100.0)

## 2020-06-12 LAB — TROPONIN I (HIGH SENSITIVITY): Troponin I (High Sensitivity): 28 ng/L — ABNORMAL HIGH (ref <18)

## 2020-06-12 LAB — APTT: aPTT: 37 seconds — ABNORMAL HIGH (ref 24–36)

## 2020-06-12 NOTE — Discharge Instructions (Signed)
I have included a copy of Daniel Whitney medical workup in the ED today.  Specifically, we found no signs of heart attack, worsening heart failure, infection, anemia, elevated ammonia, or sepsis.  His vital signs were normal.   We did not check a urine sample as he reported he does not produce urine.  He had a normal brain CT on 05/27/20 on his prior visit.  This presentation is not consistent with a stroke.  However if he continues to have behavioral disturbance, I would consider ordering an outpatient MRI of the brain.

## 2020-06-12 NOTE — ED Notes (Signed)
[facility] called for transport.  

## 2020-06-12 NOTE — ED Triage Notes (Signed)
Pt came from [facility] via EMS. Staff reports increased confusion and lethargy, began 2 weeks ago; worsening today around 1200. Pt seen for this before with no dx.  ETCO2: 50 afib on monitor, PMH of afib A&O x4 with EMS but RN at [facility] reports that this is not normal for him.  Antibx for rotting molars started on Friday  Pt is on dialysis M,W,F consistently  Pt will stand and pivot but not ambulatory at baseline.  PMH of stroke with right sided weakness, RN at [facility] reports pt normally feeds himself, but missed his mouth when trying to eat today 140/90, 80 bpm, 18 RR, 94% on RA, 100% on 2L, 97.5 temp, CBG: 155

## 2020-06-12 NOTE — ED Provider Notes (Signed)
[facility] DEPT Provider Note   CSN: [id number] Arrival date & time: 06/12/20  1554     History Chief Complaint  Patient presents with  . Weakness    Daniel Whitney is a 65 y.o. male w/ hx of ESRD on dialysis MWF (anuric), diabetes, A Fib, obesity, HTN, HLD, presenting to ED with lethargy and weakness.  Patient reports feeling exhausted all day, only wanting to sleep.  He comes from assisted living.  There was concern at his facility about him being very tired today, difficult to rouse.  He states he has not missed any dialysis.  He denies CP, SOB, headache, lightheadedness.  He is minimally ambulatory at baseline.  Hx of CVA with right sided weakness.  Pt seen in ED on 05/27/2020 for similar confusion, weakness.  Metabolic workup and CT head negative at that time for acute findings.  Discharged from ED.  Covid test was negative at that time.  HPI     Past Medical History:  Diagnosis Date  . Allergic rhinitis 07/07/2014  . Anemia due to other cause 07/07/2014  . Arthritis    "all over"  . Atrial fibrillation ([place]) 12/30/2012  . CHF (congestive heart failure) ([place]) 12/30/2012  . Chronic lower back pain   . Diabetes mellitus with neuropathy ([place])   . Diabetic retinopathy ([place]) 12/30/2012   no medications  . Dysrhythmia    Afib  . ESRD (end stage renal disease) on dialysis ([place]) started in 2013   MWF; [facility]; [employer]  . GERD (gastroesophageal reflux disease)   . Glaucoma   . History of gout    "right big toe"  . Hyperlipidemia   . Hypertension   . Hypothyroid    01/17/16- no longer on meduication  . Morbid obesity ([place]) 12/30/2012  . Myocardial infarction ([place])    "I've had ~ 3; last one was in ~ 10/2013" (01/30/2014)  . OSA (obstructive sleep apnea) 12/30/2012   "lost weight; no longer needed CPAP; retested said I needed it; didn't followup cause I was feeling fine" (01/30/2014)  . Pulmonary hypertension ([place]) 12/30/2012  . Refusal of  blood transfusions as patient is Jehovah's Witness   . Stroke [facility]) ~ 2005; ~ 2005   "they were mild; I didn't even notice I'd had them"; denies residual on 01/30/2014  . Subdural hematoma (Oct 2018)    Coumadin and heparin placed on hold at dc- pt did not FU w/ NS as recommended after dc so not on warfarin when admitted 04/03/17    Patient Active Problem List   Diagnosis Date Noted  . Hyperphosphatemia 04/29/2020  . Cellulitis of right lower extremity 04/29/2020  . Noncompliance of patient with renal dialysis ([place]) 01/11/2020  . Prolonged QT interval 02/15/2019  . Shock ([place]) 02/24/2018  . Hyperkalemia, diminished renal excretion 01/01/2018  . Syncope and collapse 04/03/2017  . History of CVA (cerebrovascular accident) 04/03/2017  . CKD (chronic kidney disease) stage V requiring chronic dialysis ([place]) 04/03/2017  . Hypothyroid 04/03/2017  . Refusal of blood transfusions as patient is Jehovah's Witness 04/03/2017  . Prediabetes-HgbA1c 6.2 04/03/2017  . History of subdural hematoma Oct 2018 04/03/2017  . 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 Chronic atrial fibrillation 04/03/2017  . Subdural hematoma ([place]) 11/29/2016  . Peripheral neuropathy 07/26/2016  . At risk for adverse drug reaction 07/03/2016  . Primary osteoarthritis of left knee 06/26/2016  . Postoperative anemia due to acute blood loss 01/27/2016  . S/P  knee replacement 01/24/2016  . Low back pain 02/09/2015  . Bilateral knee pain 01/27/2015  . Atypical atrial flutter ([place]) 01/24/2015  . Acute on chronic combined systolic and diastolic heart failure ([place]) 01/05/2015  . Hyperkalemia 11/08/2014  . Hypocalcemia 11/08/2014  . Recurrent falls 10/13/2014  . Venous stasis ulcer ([place]) 10/13/2014  . Hypoxia   . Leukocytosis   . Respiratory failure ([place])   . ARDS (adult respiratory distress syndrome) ([place])   . Encounter for central line placement   . Cardiopulmonary arrest ([place])   .  Compression fracture of L3 lumbar vertebra 08/04/2014  . Anemia of chronic renal failure, unspecified stage 07/07/2014  . Allergic rhinitis 07/07/2014  . Anemia of chronic disease 07/07/2014  . Right rotator cuff tear 04/27/2014  . Acute gouty arthritis 11/11/2013  . Hearing loss in right ear 11/11/2013  . Chronic pain syndrome 11/11/2013  . Right shoulder pain 11/11/2013  . Facial rash 11/01/2013  . Right otitis externa 10/27/2013  . Pulmonary HTN- PA 58 mmHg 04/16/13 09/17/2013  . Right hip pain 08/04/2013  . DOE (dyspnea on exertion) 06/18/2013  . Allergic rhinitis, cause unspecified 06/18/2013  . Carpal tunnel syndrome 06/06/2013  . Primary localized osteoarthrosis, lower leg 06/04/2013  . Preoperative cardiovascular examination 03/10/2013  . Preventative health care 12/30/2012  . AF (paroxysmal atrial fibrillation) ([place]) 12/30/2012  . CHF (congestive heart failure) ([place]) 12/30/2012  . Diabetic retinopathy ([place]) 12/30/2012  . Morbid obesity ([place]) 12/30/2012  . OSA (obstructive sleep apnea)- on C-pap 12/30/2012  . Bilateral hearing loss 12/30/2012  . Arthritis   . Glaucoma   . Diabetes mellitus with neuropathy ([place])   . Hypertension   . ESRD (end stage renal disease) on dialysis ([place])   . Stroke ([place])   . Hyperlipidemia     Past Surgical History:  Procedure Laterality Date  . AV FISTULA PLACEMENT Left ~ 10/2013   "forearm"  . CARDIAC CATHETERIZATION N/A 01/07/2015   Procedure: Left Heart Cath and Coronary Angiography;  Surgeon: Troy Sine, MD;  Location: [facility];  Service: Cardiovascular;  Laterality: N/A;  . CLOSED REDUCTION HIP DISLOCATION Right 1970's  . JOINT REPLACEMENT    . TOTAL HIP ARTHROPLASTY Right 1980  . TOTAL KNEE ARTHROPLASTY Right 01/24/2016   Procedure: TOTAL KNEE ARTHROPLASTY;  Surgeon: Dorna Leitz, MD;  Location: [facility];  Service: Orthopedics;  Laterality: Right;  . TOTAL KNEE ARTHROPLASTY Left 06/26/2016   Procedure: TOTAL KNEE  ARTHROPLASTY;  Surgeon: Dorna Leitz, MD;  Location: [facility];  Service: Orthopedics;  Laterality: Left;       Family History  Problem Relation Age of Onset  . Arthritis Other   . Heart disease Other   . Hyperlipidemia Other   . Hypertension Other   . Kidney disease Other   . Diabetes Other   . Stroke Other   . Diabetes Mother   . Cancer Father        bone cancer  . Heart disease Father   . Diabetes Sister   . Diabetes Brother   . Stroke Brother     Social History   Tobacco Use  . Smoking status: Never Smoker  . Smokeless tobacco: Never Used  Vaping Use  . Vaping Use: Never used  Substance Use Topics  . Alcohol use: Not Currently    Alcohol/week: 7.0 - 8.0 standard drinks    Types: 1 Cans of beer, 3 Shots of liquor, 3 - 4 Standard drinks or equivalent per week  .  Drug use: No    Home Medications Prior to Admission medications   Medication Sig Start Date End Date Taking? Authorizing Provider  amitriptyline (ELAVIL) 50 MG tablet Take 1 tablet (50 mg total) by mouth at bedtime. 02/28/18   Rai, Ripudeep K, MD  amLODipine (NORVASC) 5 MG tablet Take 1 tablet (5 mg total) by mouth at bedtime. 05/10/20   Dwyane Dee, MD  AURYXIA 1 GM 210 MG(Fe) tablet Take 210 mg by mouth 3 (three) times daily with meals. 11/27/19   [provider]  b complex-vitamin c-folic acid (NEPHRO-VITE) 0.8 MG TABS tablet Take 1 tablet by mouth daily.    [provider]  busPIRone (BUSPAR) 5 MG tablet Take 1 tablet (5 mg total) by mouth 2 (two) times daily. 05/10/20   Dwyane Dee, MD  cinacalcet (SENSIPAR) 30 MG tablet Take 30 mg by mouth daily with supper. 12/23/17   [provider]  gabapentin (NEURONTIN) 100 MG capsule Take 2 capsules (200 mg total) by mouth at bedtime. 200 mg qam and 300 mg upon return from dialysis on M-W-F for neuropathy Patient taking differently: Take 100 mg by mouth at bedtime. 07/31/16   Medina-Vargas, Monina C, NP  simvastatin (ZOCOR) 20 MG tablet  Take 1 tablet (20 mg total) by mouth daily. 01/12/20   Kayleen Memos, DO    Allergies    Tobacco [tobacco] and Other  Review of Systems   Review of Systems  Constitutional: Positive for fatigue. Negative for chills and fever.  HENT: Negative for ear pain and sore throat.   Eyes: Negative for pain and visual disturbance.  Respiratory: Negative for cough and shortness of breath.   Cardiovascular: Negative for chest pain and palpitations.  Gastrointestinal: Negative for abdominal pain and vomiting.  Genitourinary: Negative for dysuria and hematuria.  Musculoskeletal: Negative for arthralgias and back pain.  Skin: Negative for color change and rash.  Neurological: Negative for syncope, light-headedness and headaches.  All other systems reviewed and are negative.   Physical Exam Updated Vital Signs BP (!) 144/83   Pulse 71   Temp (!) 97.4 F (36.3 C) (Oral)   Resp 17   Ht '5\' 9"'$  (1.753 m)   Wt 117.9 kg   SpO2 95%   BMI 38.40 kg/m   Physical Exam Constitutional:      General: He is not in acute distress.    Comments: Sleepy, arousable  HENT:     Head: Normocephalic and atraumatic.  Eyes:     Conjunctiva/sclera: Conjunctivae normal.     Pupils: Pupils are equal, round, and reactive to light.  Cardiovascular:     Rate and Rhythm: Normal rate and regular rhythm.  Pulmonary:     Effort: Pulmonary effort is normal. No respiratory distress.  Abdominal:     General: There is no distension.     Tenderness: There is no abdominal tenderness.  Musculoskeletal:     Comments: Fistula left arm  Skin:    General: Skin is warm and dry.  Neurological:     General: No focal deficit present.     Mental Status: He is alert. Mental status is at baseline.  Psychiatric:        Mood and Affect: Mood normal.        Behavior: Behavior normal.     ED Results / Procedures / Treatments   Labs (all labs ordered are listed, but only abnormal results are displayed) Labs Reviewed   COMPREHENSIVE METABOLIC PANEL - Abnormal; Notable for the following  components:      Result Value   Sodium 131 (*)    Chloride 92 (*)    Glucose, Bld 160 (*)    BUN 30 (*)    Creatinine, Ser 6.42 (*)    Albumin 2.9 (*)    Alkaline Phosphatase 226 (*)    GFR, Estimated 9 (*)    All other components within normal limits  CBC WITH DIFFERENTIAL/PLATELET - Abnormal; Notable for the following components:   RBC 3.57 (*)    Hemoglobin 10.6 (*)    HCT 35.9 (*)    MCV 100.6 (*)    MCHC 29.5 (*)    RDW 17.0 (*)    Lymphs Abs 0.5 (*)    Eosinophils Absolute 0.6 (*)    All other components within normal limits  APTT - Abnormal; Notable for the following components:   aPTT 37 (*)    All other components within normal limits  BLOOD GAS, VENOUS - Abnormal; Notable for the following components:   pCO2, Ven 67.4 (*)    pO2, Ven <31.0 (*)    Bicarbonate 32.3 (*)    Acid-Base Excess 4.8 (*)    All other components within normal limits  BRAIN NATRIURETIC PEPTIDE - Abnormal; Notable for the following components:   B Natriuretic Peptide 1,010.4 (*)    All other components within normal limits  TROPONIN I (HIGH SENSITIVITY) - Abnormal; Notable for the following components:   Troponin I (High Sensitivity) 28 (*)    All other components within normal limits  RESP PANEL BY RT-PCR (FLU A&B, COVID) ARPGX2  CULTURE, BLOOD (SINGLE)  LACTIC ACID, PLASMA  PROTIME-INR  AMMONIA    EKG EKG Interpretation  Date/Time:  Sunday Jun 12 2020 16:39:28 EDT Ventricular Rate:  77 PR Interval:    QRS Duration: 172 QT Interval:  457 QTC Calculation: 461 R Axis:   249 Text Interpretation: Atrial fibrillation Ventricular premature complex Right bundle branch block No significant change since May 27 2020 ecg Confirmed by Octaviano Glow [phone]) on 06/12/2020 4:45:01 PM   Radiology DG Chest Port 1 View  Result Date: 06/12/2020 CLINICAL DATA:  Confusion and lethargy.  Possible sepsis. EXAM: PORTABLE CHEST 1  VIEW COMPARISON:  05/27/2020 chest radiograph and prior studies FINDINGS: Cardiomegaly and pulmonary vascular congestion again noted. Mild interstitial opacities are slightly improved. No pneumothorax or large pleural effusions noted. IMPRESSION: Cardiomegaly and pulmonary vascular congestion with slightly improved interstitial opacities/edema. Electronically Signed   By: Margarette Canada M.D.   On: 06/12/2020 16:35    Procedures Procedures   Medications Ordered in ED Medications - No data to display  ED Course  I have reviewed the triage vital signs and the nursing notes.  Pertinent labs & imaging results that were available during my care of the patient were reviewed by me and considered in my medical decision making (see chart for details).  This patient presents to the Emergency Department with complaint of lethargy. This involves an extensive number of treatment options, and is a complaint that carries with it a high risk of complications and morbidity.  The differential diagnosis includes hypoglycemia vs metabolic encephalopathy vs infection vs ICH vs stroke vs polypharmacy vs other  I ordered, reviewed, and interpreted labs, including VBG, BNP, Trop, Lactic acid, ammonia, CMP, CBC all near baseline levels. I ordered imaging studies which included dg chest I independently visualized and interpreted imaging which showed cardiomegaly, mild vasc congestion  Additional history was obtained from nursing staff at [facility] records  obtained and reviewed showing April 15 ED Visit - labs reviewed, [facility] reviewed and unremarkable. I personally reviewed the patients ECG which showed sinus rhythm with no acute ischemic findings  Normal WBC, lactate, vitals - doubt sepsis or infection.   Clinical Course as of 06/12/20 2312  Sun Jun 12, 2020  1847 Labs reviewed and are all near baseline.  There is no anemia.  Troponin at baseline.  No evidence of significant dehydration.  There is minor  elevation in PCO2 but also subsequent elevation of the bicarb, suggesting that he has some chronic CO2 retention.  This may be related to sleep apnea and habitus. [MT]  1916 I spoke to [facility] An RN who has been caring for the patient.  We reviewed the patient's work-up.  Since this time there is no evidence of infection, metabolic derangement, stroke, or other emergency to warrant hospitalization.  I advised to get in touch with the facility's doctor tomorrow to discuss his workup.  I will provide a copy of his tests for discharge.   [MT]  1920 On reassessment the patient continues to remain somnolent, but will arouse and have a conversation with me.  Will need transport back to SNF [MT]    Clinical Course User Index [MT] Rolla Kedzierski, Carola Rhine, MD    Final Clinical Impression(s) / ED Diagnoses Final diagnoses:  Weakness  Other fatigue  Somnolence    Rx / DC Orders ED Discharge Orders    None       Wyvonnia Dusky, MD 06/12/20 2312

## 2020-06-13 ENCOUNTER — Other Ambulatory Visit: Payer: Self-pay

## 2020-06-13 ENCOUNTER — Encounter (HOSPITAL_COMMUNITY): Payer: Self-pay

## 2020-06-13 ENCOUNTER — Emergency Department (HOSPITAL_COMMUNITY)
Admission: EM | Admit: 2020-06-13 | Discharge: 2020-06-14 | Disposition: A | Discharge status: Home / Self Care | Payer: Medicare HMO | Attending: Emergency Medicine | Admitting: Emergency Medicine

## 2020-06-13 DIAGNOSIS — R0902 Hypoxemia: Secondary | ICD-10-CM | POA: Diagnosis not present

## 2020-06-13 DIAGNOSIS — D631 Anemia in chronic kidney disease: Secondary | ICD-10-CM | POA: Insufficient documentation

## 2020-06-13 DIAGNOSIS — R4182 Altered mental status, unspecified: Secondary | ICD-10-CM | POA: Diagnosis not present

## 2020-06-13 DIAGNOSIS — Z96641 Presence of right artificial hip joint: Secondary | ICD-10-CM | POA: Diagnosis not present

## 2020-06-13 DIAGNOSIS — Z96653 Presence of artificial knee joint, bilateral: Secondary | ICD-10-CM | POA: Insufficient documentation

## 2020-06-13 DIAGNOSIS — Z79899 Other long term (current) drug therapy: Secondary | ICD-10-CM | POA: Insufficient documentation

## 2020-06-13 DIAGNOSIS — E039 Hypothyroidism, unspecified: Secondary | ICD-10-CM | POA: Diagnosis not present

## 2020-06-13 DIAGNOSIS — I10 Essential (primary) hypertension: Secondary | ICD-10-CM | POA: Diagnosis not present

## 2020-06-13 DIAGNOSIS — E1140 Type 2 diabetes mellitus with diabetic neuropathy, unspecified: Secondary | ICD-10-CM | POA: Insufficient documentation

## 2020-06-13 DIAGNOSIS — N186 End stage renal disease: Secondary | ICD-10-CM | POA: Insufficient documentation

## 2020-06-13 DIAGNOSIS — I5043 Acute on chronic combined systolic (congestive) and diastolic (congestive) heart failure: Secondary | ICD-10-CM | POA: Diagnosis not present

## 2020-06-13 DIAGNOSIS — I132 Hypertensive heart and chronic kidney disease with heart failure and with stage 5 chronic kidney disease, or end stage renal disease: Secondary | ICD-10-CM | POA: Diagnosis not present

## 2020-06-13 DIAGNOSIS — E1122 Type 2 diabetes mellitus with diabetic chronic kidney disease: Secondary | ICD-10-CM | POA: Diagnosis not present

## 2020-06-13 DIAGNOSIS — Z992 Dependence on renal dialysis: Secondary | ICD-10-CM | POA: Insufficient documentation

## 2020-06-13 DIAGNOSIS — E11319 Type 2 diabetes mellitus with unspecified diabetic retinopathy without macular edema: Secondary | ICD-10-CM | POA: Diagnosis not present

## 2020-06-13 DIAGNOSIS — Z0000 Encounter for general adult medical examination without abnormal findings: Secondary | ICD-10-CM | POA: Diagnosis not present

## 2020-06-13 DIAGNOSIS — R531 Weakness: Secondary | ICD-10-CM | POA: Diagnosis not present

## 2020-06-13 DIAGNOSIS — R410 Disorientation, unspecified: Secondary | ICD-10-CM | POA: Diagnosis not present

## 2020-06-13 DIAGNOSIS — N2581 Secondary hyperparathyroidism of renal origin: Secondary | ICD-10-CM | POA: Diagnosis not present

## 2020-06-13 DIAGNOSIS — R29810 Facial weakness: Secondary | ICD-10-CM | POA: Diagnosis not present

## 2020-06-13 DIAGNOSIS — I4510 Unspecified right bundle-branch block: Secondary | ICD-10-CM | POA: Diagnosis not present

## 2020-06-13 NOTE — ED Notes (Signed)
[facility] called  

## 2020-06-13 NOTE — Discharge Instructions (Addendum)
Follow-up with your primary provider for reevaluation and further care. Take home medications as prescribed. If you have any worsening symptoms or further concerns for your health please return to an emergency department for further evaluation.

## 2020-06-13 NOTE — ED Provider Notes (Signed)
[facility] EMERGENCY DEPARTMENT Provider Note   CSN: [id number] Arrival date & time: 06/13/20  1808     History Chief Complaint  Patient presents with  . Altered Mental Status    Daniel Whitney is a 65 y.o. male.  HPI   65 year old male with past medical history of end-stage renal disease on dialysis, DM, atrial fibrillation, presents to the emergency department with concern for change in mental status.  Report from EMS is that after his dialysis session the staff appreciated him to be confused so they called an ambulance.  On their arrival they state that he was alert and oriented x4, appropriate and following commands.  Fingerstick was normal.  Vitals were normal.  On arrival here patient is sitting up, talking to me.  Oriented to where he is and himself.  He received a full dialysis session.  Of note patient was seen in the ER on 05/27/2020 and yesterday for similar complaints.  All of those work-ups were negative.  Past Medical History:  Diagnosis Date  . Allergic rhinitis 07/07/2014  . Anemia due to other cause 07/07/2014  . Arthritis    "all over"  . Atrial fibrillation ([place]) 12/30/2012  . CHF (congestive heart failure) ([place]) 12/30/2012  . Chronic lower back pain   . Diabetes mellitus with neuropathy ([place])   . Diabetic retinopathy ([place]) 12/30/2012   no medications  . Dysrhythmia    Afib  . ESRD (end stage renal disease) on dialysis ([place]) started in 2013   MWF; [facility]; [employer]  . GERD (gastroesophageal reflux disease)   . Glaucoma   . History of gout    "right big toe"  . Hyperlipidemia   . Hypertension   . Hypothyroid    01/17/16- no longer on meduication  . Morbid obesity ([place]) 12/30/2012  . Myocardial infarction ([place])    "I've had ~ 3; last one was in ~ 10/2013" (01/30/2014)  . OSA (obstructive sleep apnea) 12/30/2012   "lost weight; no longer needed CPAP; retested said I needed it; didn't followup cause I was feeling fine" (01/30/2014)  .  Pulmonary hypertension ([place]) 12/30/2012  . Refusal of blood transfusions as patient is Jehovah's Witness   . Stroke [facility]) ~ 2005; ~ 2005   "they were mild; I didn't even notice I'd had them"; denies residual on 01/30/2014  . Subdural hematoma (Oct 2018)    Coumadin and heparin placed on hold at dc- pt did not FU w/ NS as recommended after dc so not on warfarin when admitted 04/03/17    Patient Active Problem List   Diagnosis Date Noted  . Hyperphosphatemia 04/29/2020  . Cellulitis of right lower extremity 04/29/2020  . Noncompliance of patient with renal dialysis ([place]) 01/11/2020  . Prolonged QT interval 02/15/2019  . Shock ([place]) 02/24/2018  . Hyperkalemia, diminished renal excretion 01/01/2018  . Syncope and collapse 04/03/2017  . History of CVA (cerebrovascular accident) 04/03/2017  . CKD (chronic kidney disease) stage V requiring chronic dialysis ([place]) 04/03/2017  . Hypothyroid 04/03/2017  . Refusal of blood transfusions as patient is Jehovah's Witness 04/03/2017  . Prediabetes-HgbA1c 6.2 04/03/2017  . History of subdural hematoma Oct 2018 04/03/2017  . 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 Chronic atrial fibrillation 04/03/2017  . Subdural hematoma ([place]) 11/29/2016  . Peripheral neuropathy 07/26/2016  . At risk for adverse drug reaction 07/03/2016  . Primary osteoarthritis of left knee 06/26/2016  . Postoperative anemia due to acute blood loss  01/27/2016  . S/P knee replacement 01/24/2016  . Low back pain 02/09/2015  . Bilateral knee pain 01/27/2015  . Atypical atrial flutter ([place]) 01/24/2015  . Acute on chronic combined systolic and diastolic heart failure ([place]) 01/05/2015  . Hyperkalemia 11/08/2014  . Hypocalcemia 11/08/2014  . Recurrent falls 10/13/2014  . Venous stasis ulcer ([place]) 10/13/2014  . Hypoxia   . Leukocytosis   . Respiratory failure ([place])   . ARDS (adult respiratory distress syndrome) ([place])   . Encounter for central  line placement   . Cardiopulmonary arrest ([place])   . Compression fracture of L3 lumbar vertebra 08/04/2014  . Anemia of chronic renal failure, unspecified stage 07/07/2014  . Allergic rhinitis 07/07/2014  . Anemia of chronic disease 07/07/2014  . Right rotator cuff tear 04/27/2014  . Acute gouty arthritis 11/11/2013  . Hearing loss in right ear 11/11/2013  . Chronic pain syndrome 11/11/2013  . Right shoulder pain 11/11/2013  . Facial rash 11/01/2013  . Right otitis externa 10/27/2013  . Pulmonary HTN- PA 58 mmHg 04/16/13 09/17/2013  . Right hip pain 08/04/2013  . DOE (dyspnea on exertion) 06/18/2013  . Allergic rhinitis, cause unspecified 06/18/2013  . Carpal tunnel syndrome 06/06/2013  . Primary localized osteoarthrosis, lower leg 06/04/2013  . Preoperative cardiovascular examination 03/10/2013  . Preventative health care 12/30/2012  . AF (paroxysmal atrial fibrillation) ([place]) 12/30/2012  . CHF (congestive heart failure) ([place]) 12/30/2012  . Diabetic retinopathy ([place]) 12/30/2012  . Morbid obesity ([place]) 12/30/2012  . OSA (obstructive sleep apnea)- on C-pap 12/30/2012  . Bilateral hearing loss 12/30/2012  . Arthritis   . Glaucoma   . Diabetes mellitus with neuropathy ([place])   . Hypertension   . ESRD (end stage renal disease) on dialysis ([place])   . Stroke ([place])   . Hyperlipidemia     Past Surgical History:  Procedure Laterality Date  . AV FISTULA PLACEMENT Left ~ 10/2013   "forearm"  . CARDIAC CATHETERIZATION N/A 01/07/2015   Procedure: Left Heart Cath and Coronary Angiography;  Surgeon: Troy Sine, MD;  Location: [facility];  Service: Cardiovascular;  Laterality: N/A;  . CLOSED REDUCTION HIP DISLOCATION Right 1970's  . JOINT REPLACEMENT    . TOTAL HIP ARTHROPLASTY Right 1980  . TOTAL KNEE ARTHROPLASTY Right 01/24/2016   Procedure: TOTAL KNEE ARTHROPLASTY;  Surgeon: Dorna Leitz, MD;  Location: [facility];  Service: Orthopedics;  Laterality: Right;  . TOTAL KNEE  ARTHROPLASTY Left 06/26/2016   Procedure: TOTAL KNEE ARTHROPLASTY;  Surgeon: Dorna Leitz, MD;  Location: [facility];  Service: Orthopedics;  Laterality: Left;       Family History  Problem Relation Age of Onset  . Arthritis Other   . Heart disease Other   . Hyperlipidemia Other   . Hypertension Other   . Kidney disease Other   . Diabetes Other   . Stroke Other   . Diabetes Mother   . Cancer Father        bone cancer  . Heart disease Father   . Diabetes Sister   . Diabetes Brother   . Stroke Brother     Social History   Tobacco Use  . Smoking status: Never Smoker  . Smokeless tobacco: Never Used  Vaping Use  . Vaping Use: Never used  Substance Use Topics  . Alcohol use: Not Currently    Alcohol/week: 7.0 - 8.0 standard drinks    Types: 1 Cans of beer, 3 Shots of liquor, 3 - 4 Standard drinks or equivalent  per week  . Drug use: No    Home Medications Prior to Admission medications   Medication Sig Start Date End Date Taking? Authorizing Provider  amitriptyline (ELAVIL) 50 MG tablet Take 1 tablet (50 mg total) by mouth at bedtime. 02/28/18   Rai, Ripudeep K, MD  amLODipine (NORVASC) 5 MG tablet Take 1 tablet (5 mg total) by mouth at bedtime. 05/10/20   Dwyane Dee, MD  AURYXIA 1 GM 210 MG(Fe) tablet Take 210 mg by mouth 3 (three) times daily with meals. 11/27/19   [provider]  b complex-vitamin c-folic acid (NEPHRO-VITE) 0.8 MG TABS tablet Take 1 tablet by mouth daily.    [provider]  busPIRone (BUSPAR) 5 MG tablet Take 1 tablet (5 mg total) by mouth 2 (two) times daily. 05/10/20   Dwyane Dee, MD  cinacalcet (SENSIPAR) 30 MG tablet Take 30 mg by mouth daily with supper. 12/23/17   [provider]  gabapentin (NEURONTIN) 100 MG capsule Take 2 capsules (200 mg total) by mouth at bedtime. 200 mg qam and 300 mg upon return from dialysis on M-W-F for neuropathy Patient taking differently: Take 100 mg by mouth at bedtime. 07/31/16    Medina-Vargas, Monina C, NP  simvastatin (ZOCOR) 20 MG tablet Take 1 tablet (20 mg total) by mouth daily. 01/12/20   Kayleen Memos, DO    Allergies    Tobacco [tobacco] and Other  Review of Systems   Review of Systems  Constitutional: Negative for chills and fever.  HENT: Negative for congestion.   Eyes: Negative for visual disturbance.  Respiratory: Negative for shortness of breath.   Cardiovascular: Negative for chest pain.  Gastrointestinal: Negative for abdominal pain, diarrhea and vomiting.  Genitourinary: Negative for dysuria.  Skin: Negative for rash.  Neurological: Negative for headaches.  Psychiatric/Behavioral: Positive for confusion.    Physical Exam Updated Vital Signs BP (!) 146/79 (BP Location: Right Arm)   Pulse 80   Temp 98.6 F (37 C) (Oral)   Resp (!) 22   Ht '5\' 9"'$  (1.753 m)   Wt 119.3 kg   SpO2 95%   BMI 38.84 kg/m   Physical Exam Vitals and nursing note reviewed.  Constitutional:      General: He is not in acute distress.    Appearance: Normal appearance. He is not ill-appearing, toxic-appearing or diaphoretic.  HENT:     Head: Normocephalic.     Mouth/Throat:     Mouth: Mucous membranes are moist.  Eyes:     Extraocular Movements: Extraocular movements intact.  Cardiovascular:     Rate and Rhythm: Normal rate.  Pulmonary:     Effort: Pulmonary effort is normal. No respiratory distress.  Abdominal:     Palpations: Abdomen is soft.     Tenderness: There is no abdominal tenderness.  Skin:    General: Skin is warm.  Neurological:     Mental Status: He is alert. Mental status is at baseline.     Comments: Patient is oriented to self, month and place, he sometimes gets the year wrong but states that that is normal for him  Psychiatric:        Mood and Affect: Mood normal.     ED Results / Procedures / Treatments   Labs (all labs ordered are listed, but only abnormal results are displayed) Labs Reviewed - No data to display  EKG EKG  Interpretation  Date/Time:  Monday Jun 13 2020 18:14:20 EDT Ventricular Rate:  84 PR Interval:  QRS Duration: 166 QT Interval:  457 QTC Calculation: 541 R Axis:   -29 Text Interpretation: Atrial fibrillation IVCD, consider atypical RBBB Abnormal T, consider ischemia, lateral leads Unchanged Confirmed by Lavenia Atlas [phone]) on 06/13/2020 6:16:19 PM   Radiology DG Chest Port 1 View  Result Date: 06/12/2020 CLINICAL DATA:  Confusion and lethargy.  Possible sepsis. EXAM: PORTABLE CHEST 1 VIEW COMPARISON:  05/27/2020 chest radiograph and prior studies FINDINGS: Cardiomegaly and pulmonary vascular congestion again noted. Mild interstitial opacities are slightly improved. No pneumothorax or large pleural effusions noted. IMPRESSION: Cardiomegaly and pulmonary vascular congestion with slightly improved interstitial opacities/edema. Electronically Signed   By: Margarette Canada M.D.   On: 06/12/2020 16:35    Procedures Procedures   Medications Ordered in ED Medications - No data to display  ED Course  I have reviewed the triage vital signs and the nursing notes.  Pertinent labs & imaging results that were available during my care of the patient were reviewed by me and considered in my medical decision making (see chart for details).    MDM Rules/Calculators/A&P                          65 year old male presents to the emergency department for concern of mental status change.  When EMS picked the patient up they said that he was alert and oriented x4 with normal vitals, appropriate CBG.  On my evaluation the patient is sitting up, awake and conversational, appears oriented and states that he feels baseline.  He has no acute complaints.  Patient was seen in the ER yesterday for the same complaints.  His work-up at that time was baseline for him and he was discharged home.  Given that the patient appears to be in his baseline status with no complaints I do not feel it warrants full repeat work-up  since he just had this less than 24 hours ago and it was baseline.  Patient has no acute complaints, he states that he feels well and he is requesting to go back to his facility.  On reevaluation patient is sleeping, easily arousable.  States that he is tired because this is typically his bedtime, again offers no acute complaints.  Patient will be discharged and treated as an outpatient.  Discharge plan and strict return to ED precautions discussed, patient verbalizes understanding and agreement.  Final Clinical Impression(s) / ED Diagnoses Final diagnoses:  General medical exam    Rx / DC Orders ED Discharge Orders    None       Lorelle Gibbs, DO 06/13/20 2048

## 2020-06-13 NOTE — ED Notes (Signed)
Pt waiting on transportation at this time

## 2020-06-13 NOTE — ED Triage Notes (Addendum)
Pt arrived to ED via EMS from dialysis center. Pt is from [facility]. Dialysis called EMS d/t pt was confused after dialysis tx. Pt reports dialysis staff woke up him after dialysis and said he wasn't answering questions appropriately and was lethargic. Pt was seen at [facility] yesterday for similar s/s. EMS reports pt was A&Ox4 upon arrival to scene. EMS reports pt received full dialysis tx. VSS w/ EMS. BP 116/90, HR 90 A-fib, CBG 113, 95% RA.

## 2020-06-13 NOTE — ED Notes (Signed)
Received verbal report from [facility]

## 2020-06-13 NOTE — ED Notes (Signed)
Attempted to call report to [facility].

## 2020-06-13 NOTE — ED Notes (Signed)
Called [facility] to transport patient to [facility], Pt has wheelchair which will need to be picked up a different way. Patient is 7th on the list.

## 2020-06-14 NOTE — ED Notes (Signed)
Pt wheelchair placed in [place] room for facility to pick up tomorrow

## 2020-06-14 NOTE — ED Notes (Signed)
[facility] arrived for transport. Attempted to take pt temp and pt refused. Told me to get away from him

## 2020-06-15 DIAGNOSIS — Z992 Dependence on renal dialysis: Secondary | ICD-10-CM | POA: Diagnosis not present

## 2020-06-15 DIAGNOSIS — N2581 Secondary hyperparathyroidism of renal origin: Secondary | ICD-10-CM | POA: Diagnosis not present

## 2020-06-15 DIAGNOSIS — N186 End stage renal disease: Secondary | ICD-10-CM | POA: Diagnosis not present

## 2020-06-16 ENCOUNTER — Ambulatory Visit: Payer: Self-pay

## 2020-06-17 DIAGNOSIS — N2581 Secondary hyperparathyroidism of renal origin: Secondary | ICD-10-CM | POA: Diagnosis not present

## 2020-06-17 DIAGNOSIS — Z992 Dependence on renal dialysis: Secondary | ICD-10-CM | POA: Diagnosis not present

## 2020-06-17 DIAGNOSIS — N186 End stage renal disease: Secondary | ICD-10-CM | POA: Diagnosis not present

## 2020-06-17 LAB — CULTURE, BLOOD (SINGLE)
Culture: NO GROWTH
Special Requests: ADEQUATE

## 2020-06-20 DIAGNOSIS — N186 End stage renal disease: Secondary | ICD-10-CM | POA: Diagnosis not present

## 2020-06-20 DIAGNOSIS — Z992 Dependence on renal dialysis: Secondary | ICD-10-CM | POA: Diagnosis not present

## 2020-06-20 DIAGNOSIS — N2581 Secondary hyperparathyroidism of renal origin: Secondary | ICD-10-CM | POA: Diagnosis not present

## 2020-06-21 ENCOUNTER — Other Ambulatory Visit: Payer: Self-pay

## 2020-06-21 DIAGNOSIS — M6281 Muscle weakness (generalized): Secondary | ICD-10-CM | POA: Diagnosis not present

## 2020-06-21 DIAGNOSIS — R2681 Unsteadiness on feet: Secondary | ICD-10-CM | POA: Diagnosis not present

## 2020-06-21 DIAGNOSIS — R278 Other lack of coordination: Secondary | ICD-10-CM | POA: Diagnosis not present

## 2020-06-21 DIAGNOSIS — R2689 Other abnormalities of gait and mobility: Secondary | ICD-10-CM | POA: Diagnosis not present

## 2020-06-21 DIAGNOSIS — L03115 Cellulitis of right lower limb: Secondary | ICD-10-CM | POA: Diagnosis not present

## 2020-06-21 NOTE — Patient Outreach (Signed)
[facility]) Care Management  06/21/2020  Daniel Whitney 07-14-1955 [id number]   Referral Date: 06/21/20 Referral Source: [employer] Report Date of Discharge:06/20/20 Facility:  [facility] [place]: [facility]  Outreach attempt: spoke with patient. He states that he is still at [facility] and plans to be there long term.    Plan: RN CM will close case.   Jone Baseman, RN, MSN [facility] Care Management Care Management Coordinator Direct Line [phone] Toll Free: [phone]  Fax: [phone]

## 2020-06-22 DIAGNOSIS — L03115 Cellulitis of right lower limb: Secondary | ICD-10-CM | POA: Diagnosis not present

## 2020-06-22 DIAGNOSIS — N2581 Secondary hyperparathyroidism of renal origin: Secondary | ICD-10-CM | POA: Diagnosis not present

## 2020-06-22 DIAGNOSIS — N186 End stage renal disease: Secondary | ICD-10-CM | POA: Diagnosis not present

## 2020-06-22 DIAGNOSIS — R2689 Other abnormalities of gait and mobility: Secondary | ICD-10-CM | POA: Diagnosis not present

## 2020-06-22 DIAGNOSIS — Z992 Dependence on renal dialysis: Secondary | ICD-10-CM | POA: Diagnosis not present

## 2020-06-22 DIAGNOSIS — M6281 Muscle weakness (generalized): Secondary | ICD-10-CM | POA: Diagnosis not present

## 2020-06-22 DIAGNOSIS — R278 Other lack of coordination: Secondary | ICD-10-CM | POA: Diagnosis not present

## 2020-06-22 DIAGNOSIS — R2681 Unsteadiness on feet: Secondary | ICD-10-CM | POA: Diagnosis not present

## 2020-06-23 DIAGNOSIS — F322 Major depressive disorder, single episode, severe without psychotic features: Secondary | ICD-10-CM | POA: Diagnosis not present

## 2020-06-23 DIAGNOSIS — I872 Venous insufficiency (chronic) (peripheral): Secondary | ICD-10-CM | POA: Diagnosis not present

## 2020-06-23 DIAGNOSIS — L0390 Cellulitis, unspecified: Secondary | ICD-10-CM | POA: Diagnosis not present

## 2020-06-23 DIAGNOSIS — L03115 Cellulitis of right lower limb: Secondary | ICD-10-CM | POA: Diagnosis not present

## 2020-06-23 DIAGNOSIS — N186 End stage renal disease: Secondary | ICD-10-CM | POA: Diagnosis not present

## 2020-06-23 DIAGNOSIS — D638 Anemia in other chronic diseases classified elsewhere: Secondary | ICD-10-CM | POA: Diagnosis not present

## 2020-06-23 DIAGNOSIS — R2689 Other abnormalities of gait and mobility: Secondary | ICD-10-CM | POA: Diagnosis not present

## 2020-06-23 DIAGNOSIS — R2681 Unsteadiness on feet: Secondary | ICD-10-CM | POA: Diagnosis not present

## 2020-06-23 DIAGNOSIS — Z992 Dependence on renal dialysis: Secondary | ICD-10-CM | POA: Diagnosis not present

## 2020-06-23 DIAGNOSIS — R278 Other lack of coordination: Secondary | ICD-10-CM | POA: Diagnosis not present

## 2020-06-23 DIAGNOSIS — M6281 Muscle weakness (generalized): Secondary | ICD-10-CM | POA: Diagnosis not present

## 2020-06-23 DIAGNOSIS — E1122 Type 2 diabetes mellitus with diabetic chronic kidney disease: Secondary | ICD-10-CM | POA: Diagnosis not present

## 2020-06-24 DIAGNOSIS — R2689 Other abnormalities of gait and mobility: Secondary | ICD-10-CM | POA: Diagnosis not present

## 2020-06-24 DIAGNOSIS — M6281 Muscle weakness (generalized): Secondary | ICD-10-CM | POA: Diagnosis not present

## 2020-06-24 DIAGNOSIS — N2581 Secondary hyperparathyroidism of renal origin: Secondary | ICD-10-CM | POA: Diagnosis not present

## 2020-06-24 DIAGNOSIS — R278 Other lack of coordination: Secondary | ICD-10-CM | POA: Diagnosis not present

## 2020-06-24 DIAGNOSIS — R2681 Unsteadiness on feet: Secondary | ICD-10-CM | POA: Diagnosis not present

## 2020-06-24 DIAGNOSIS — Z992 Dependence on renal dialysis: Secondary | ICD-10-CM | POA: Diagnosis not present

## 2020-06-24 DIAGNOSIS — L03115 Cellulitis of right lower limb: Secondary | ICD-10-CM | POA: Diagnosis not present

## 2020-06-24 DIAGNOSIS — N186 End stage renal disease: Secondary | ICD-10-CM | POA: Diagnosis not present

## 2020-06-25 DIAGNOSIS — R2681 Unsteadiness on feet: Secondary | ICD-10-CM | POA: Diagnosis not present

## 2020-06-25 DIAGNOSIS — R278 Other lack of coordination: Secondary | ICD-10-CM | POA: Diagnosis not present

## 2020-06-25 DIAGNOSIS — L03115 Cellulitis of right lower limb: Secondary | ICD-10-CM | POA: Diagnosis not present

## 2020-06-25 DIAGNOSIS — M6281 Muscle weakness (generalized): Secondary | ICD-10-CM | POA: Diagnosis not present

## 2020-06-25 DIAGNOSIS — R2689 Other abnormalities of gait and mobility: Secondary | ICD-10-CM | POA: Diagnosis not present

## 2020-06-26 DIAGNOSIS — R2689 Other abnormalities of gait and mobility: Secondary | ICD-10-CM | POA: Diagnosis not present

## 2020-06-26 DIAGNOSIS — M6281 Muscle weakness (generalized): Secondary | ICD-10-CM | POA: Diagnosis not present

## 2020-06-26 DIAGNOSIS — L03115 Cellulitis of right lower limb: Secondary | ICD-10-CM | POA: Diagnosis not present

## 2020-06-26 DIAGNOSIS — R2681 Unsteadiness on feet: Secondary | ICD-10-CM | POA: Diagnosis not present

## 2020-06-26 DIAGNOSIS — R278 Other lack of coordination: Secondary | ICD-10-CM | POA: Diagnosis not present

## 2020-06-27 DIAGNOSIS — R2689 Other abnormalities of gait and mobility: Secondary | ICD-10-CM | POA: Diagnosis not present

## 2020-06-27 DIAGNOSIS — Z992 Dependence on renal dialysis: Secondary | ICD-10-CM | POA: Diagnosis not present

## 2020-06-27 DIAGNOSIS — N2581 Secondary hyperparathyroidism of renal origin: Secondary | ICD-10-CM | POA: Diagnosis not present

## 2020-06-27 DIAGNOSIS — M6281 Muscle weakness (generalized): Secondary | ICD-10-CM | POA: Diagnosis not present

## 2020-06-27 DIAGNOSIS — N186 End stage renal disease: Secondary | ICD-10-CM | POA: Diagnosis not present

## 2020-06-27 DIAGNOSIS — E785 Hyperlipidemia, unspecified: Secondary | ICD-10-CM | POA: Diagnosis not present

## 2020-06-27 DIAGNOSIS — R2681 Unsteadiness on feet: Secondary | ICD-10-CM | POA: Diagnosis not present

## 2020-06-27 DIAGNOSIS — E08610 Diabetes mellitus due to underlying condition with diabetic neuropathic arthropathy: Secondary | ICD-10-CM | POA: Diagnosis not present

## 2020-06-27 DIAGNOSIS — L03115 Cellulitis of right lower limb: Secondary | ICD-10-CM | POA: Diagnosis not present

## 2020-06-27 DIAGNOSIS — R278 Other lack of coordination: Secondary | ICD-10-CM | POA: Diagnosis not present

## 2020-06-28 DIAGNOSIS — R2689 Other abnormalities of gait and mobility: Secondary | ICD-10-CM | POA: Diagnosis not present

## 2020-06-28 DIAGNOSIS — L03115 Cellulitis of right lower limb: Secondary | ICD-10-CM | POA: Diagnosis not present

## 2020-06-28 DIAGNOSIS — R2681 Unsteadiness on feet: Secondary | ICD-10-CM | POA: Diagnosis not present

## 2020-06-28 DIAGNOSIS — M6281 Muscle weakness (generalized): Secondary | ICD-10-CM | POA: Diagnosis not present

## 2020-06-28 DIAGNOSIS — R278 Other lack of coordination: Secondary | ICD-10-CM | POA: Diagnosis not present

## 2020-06-29 DIAGNOSIS — R278 Other lack of coordination: Secondary | ICD-10-CM | POA: Diagnosis not present

## 2020-06-29 DIAGNOSIS — L03115 Cellulitis of right lower limb: Secondary | ICD-10-CM | POA: Diagnosis not present

## 2020-06-29 DIAGNOSIS — R2681 Unsteadiness on feet: Secondary | ICD-10-CM | POA: Diagnosis not present

## 2020-06-29 DIAGNOSIS — N186 End stage renal disease: Secondary | ICD-10-CM | POA: Diagnosis not present

## 2020-06-29 DIAGNOSIS — R2689 Other abnormalities of gait and mobility: Secondary | ICD-10-CM | POA: Diagnosis not present

## 2020-06-29 DIAGNOSIS — Z992 Dependence on renal dialysis: Secondary | ICD-10-CM | POA: Diagnosis not present

## 2020-06-29 DIAGNOSIS — N2581 Secondary hyperparathyroidism of renal origin: Secondary | ICD-10-CM | POA: Diagnosis not present

## 2020-06-29 DIAGNOSIS — M6281 Muscle weakness (generalized): Secondary | ICD-10-CM | POA: Diagnosis not present

## 2020-06-30 DIAGNOSIS — R2681 Unsteadiness on feet: Secondary | ICD-10-CM | POA: Diagnosis not present

## 2020-06-30 DIAGNOSIS — M6281 Muscle weakness (generalized): Secondary | ICD-10-CM | POA: Diagnosis not present

## 2020-06-30 DIAGNOSIS — R2689 Other abnormalities of gait and mobility: Secondary | ICD-10-CM | POA: Diagnosis not present

## 2020-06-30 DIAGNOSIS — R278 Other lack of coordination: Secondary | ICD-10-CM | POA: Diagnosis not present

## 2020-06-30 DIAGNOSIS — L03115 Cellulitis of right lower limb: Secondary | ICD-10-CM | POA: Diagnosis not present

## 2020-07-01 DIAGNOSIS — N2581 Secondary hyperparathyroidism of renal origin: Secondary | ICD-10-CM | POA: Diagnosis not present

## 2020-07-01 DIAGNOSIS — Z992 Dependence on renal dialysis: Secondary | ICD-10-CM | POA: Diagnosis not present

## 2020-07-01 DIAGNOSIS — R278 Other lack of coordination: Secondary | ICD-10-CM | POA: Diagnosis not present

## 2020-07-01 DIAGNOSIS — R2689 Other abnormalities of gait and mobility: Secondary | ICD-10-CM | POA: Diagnosis not present

## 2020-07-01 DIAGNOSIS — L03115 Cellulitis of right lower limb: Secondary | ICD-10-CM | POA: Diagnosis not present

## 2020-07-01 DIAGNOSIS — M6281 Muscle weakness (generalized): Secondary | ICD-10-CM | POA: Diagnosis not present

## 2020-07-01 DIAGNOSIS — N186 End stage renal disease: Secondary | ICD-10-CM | POA: Diagnosis not present

## 2020-07-01 DIAGNOSIS — R2681 Unsteadiness on feet: Secondary | ICD-10-CM | POA: Diagnosis not present

## 2020-07-02 DIAGNOSIS — M6281 Muscle weakness (generalized): Secondary | ICD-10-CM | POA: Diagnosis not present

## 2020-07-02 DIAGNOSIS — R2689 Other abnormalities of gait and mobility: Secondary | ICD-10-CM | POA: Diagnosis not present

## 2020-07-02 DIAGNOSIS — L03115 Cellulitis of right lower limb: Secondary | ICD-10-CM | POA: Diagnosis not present

## 2020-07-02 DIAGNOSIS — R2681 Unsteadiness on feet: Secondary | ICD-10-CM | POA: Diagnosis not present

## 2020-07-02 DIAGNOSIS — R278 Other lack of coordination: Secondary | ICD-10-CM | POA: Diagnosis not present

## 2020-07-04 DIAGNOSIS — N186 End stage renal disease: Secondary | ICD-10-CM | POA: Diagnosis not present

## 2020-07-04 DIAGNOSIS — Z992 Dependence on renal dialysis: Secondary | ICD-10-CM | POA: Diagnosis not present

## 2020-07-04 DIAGNOSIS — R2689 Other abnormalities of gait and mobility: Secondary | ICD-10-CM | POA: Diagnosis not present

## 2020-07-04 DIAGNOSIS — L03115 Cellulitis of right lower limb: Secondary | ICD-10-CM | POA: Diagnosis not present

## 2020-07-04 DIAGNOSIS — R278 Other lack of coordination: Secondary | ICD-10-CM | POA: Diagnosis not present

## 2020-07-04 DIAGNOSIS — R2681 Unsteadiness on feet: Secondary | ICD-10-CM | POA: Diagnosis not present

## 2020-07-04 DIAGNOSIS — M6281 Muscle weakness (generalized): Secondary | ICD-10-CM | POA: Diagnosis not present

## 2020-07-04 DIAGNOSIS — N2581 Secondary hyperparathyroidism of renal origin: Secondary | ICD-10-CM | POA: Diagnosis not present

## 2020-07-05 DIAGNOSIS — R2681 Unsteadiness on feet: Secondary | ICD-10-CM | POA: Diagnosis not present

## 2020-07-05 DIAGNOSIS — M6281 Muscle weakness (generalized): Secondary | ICD-10-CM | POA: Diagnosis not present

## 2020-07-05 DIAGNOSIS — R2689 Other abnormalities of gait and mobility: Secondary | ICD-10-CM | POA: Diagnosis not present

## 2020-07-05 DIAGNOSIS — L03115 Cellulitis of right lower limb: Secondary | ICD-10-CM | POA: Diagnosis not present

## 2020-07-05 DIAGNOSIS — R278 Other lack of coordination: Secondary | ICD-10-CM | POA: Diagnosis not present

## 2020-07-06 DIAGNOSIS — N186 End stage renal disease: Secondary | ICD-10-CM | POA: Diagnosis not present

## 2020-07-06 DIAGNOSIS — Z9181 History of falling: Secondary | ICD-10-CM | POA: Diagnosis not present

## 2020-07-06 DIAGNOSIS — Z992 Dependence on renal dialysis: Secondary | ICD-10-CM | POA: Diagnosis not present

## 2020-07-06 DIAGNOSIS — E1142 Type 2 diabetes mellitus with diabetic polyneuropathy: Secondary | ICD-10-CM | POA: Diagnosis not present

## 2020-07-06 DIAGNOSIS — R2681 Unsteadiness on feet: Secondary | ICD-10-CM | POA: Diagnosis not present

## 2020-07-06 DIAGNOSIS — R278 Other lack of coordination: Secondary | ICD-10-CM | POA: Diagnosis not present

## 2020-07-06 DIAGNOSIS — M6281 Muscle weakness (generalized): Secondary | ICD-10-CM | POA: Diagnosis not present

## 2020-07-06 DIAGNOSIS — L03115 Cellulitis of right lower limb: Secondary | ICD-10-CM | POA: Diagnosis not present

## 2020-07-06 DIAGNOSIS — R2689 Other abnormalities of gait and mobility: Secondary | ICD-10-CM | POA: Diagnosis not present

## 2020-07-06 DIAGNOSIS — N2581 Secondary hyperparathyroidism of renal origin: Secondary | ICD-10-CM | POA: Diagnosis not present

## 2020-07-07 DIAGNOSIS — S06360A Traumatic hemorrhage of cerebrum, unspecified, without loss of consciousness, initial encounter: Secondary | ICD-10-CM | POA: Diagnosis not present

## 2020-07-07 DIAGNOSIS — S12600A Unspecified displaced fracture of seventh cervical vertebra, initial encounter for closed fracture: Secondary | ICD-10-CM | POA: Diagnosis not present

## 2020-07-07 DIAGNOSIS — S129XXA Fracture of neck, unspecified, initial encounter: Secondary | ICD-10-CM | POA: Diagnosis not present

## 2020-07-07 DIAGNOSIS — R0602 Shortness of breath: Secondary | ICD-10-CM | POA: Diagnosis not present

## 2020-07-07 DIAGNOSIS — R569 Unspecified convulsions: Secondary | ICD-10-CM | POA: Diagnosis not present

## 2020-07-07 DIAGNOSIS — I120 Hypertensive chronic kidney disease with stage 5 chronic kidney disease or end stage renal disease: Secondary | ICD-10-CM | POA: Diagnosis not present

## 2020-07-07 DIAGNOSIS — Z992 Dependence on renal dialysis: Secondary | ICD-10-CM | POA: Diagnosis not present

## 2020-07-07 DIAGNOSIS — K5900 Constipation, unspecified: Secondary | ICD-10-CM | POA: Diagnosis not present

## 2020-07-07 DIAGNOSIS — R579 Shock, unspecified: Secondary | ICD-10-CM | POA: Diagnosis not present

## 2020-07-07 DIAGNOSIS — J9602 Acute respiratory failure with hypercapnia: Secondary | ICD-10-CM | POA: Diagnosis not present

## 2020-07-07 DIAGNOSIS — I4891 Unspecified atrial fibrillation: Secondary | ICD-10-CM | POA: Diagnosis not present

## 2020-07-07 DIAGNOSIS — E1151 Type 2 diabetes mellitus with diabetic peripheral angiopathy without gangrene: Secondary | ICD-10-CM | POA: Diagnosis not present

## 2020-07-07 DIAGNOSIS — S065X9A Traumatic subdural hemorrhage with loss of consciousness of unspecified duration, initial encounter: Secondary | ICD-10-CM | POA: Diagnosis not present

## 2020-07-07 DIAGNOSIS — R9431 Abnormal electrocardiogram [ECG] [EKG]: Secondary | ICD-10-CM | POA: Diagnosis not present

## 2020-07-07 DIAGNOSIS — S069X0A Unspecified intracranial injury without loss of consciousness, initial encounter: Secondary | ICD-10-CM | POA: Diagnosis not present

## 2020-07-07 DIAGNOSIS — R000 Tachycardia, unspecified: Secondary | ICD-10-CM | POA: Diagnosis not present

## 2020-07-07 DIAGNOSIS — M542 Cervicalgia: Secondary | ICD-10-CM | POA: Diagnosis not present

## 2020-07-07 DIAGNOSIS — Z9889 Other specified postprocedural states: Secondary | ICD-10-CM | POA: Diagnosis not present

## 2020-07-07 DIAGNOSIS — J810 Acute pulmonary edema: Secondary | ICD-10-CM | POA: Diagnosis not present

## 2020-07-07 DIAGNOSIS — Z515 Encounter for palliative care: Secondary | ICD-10-CM | POA: Diagnosis not present

## 2020-07-07 DIAGNOSIS — S32030S Wedge compression fracture of third lumbar vertebra, sequela: Secondary | ICD-10-CM | POA: Diagnosis not present

## 2020-07-07 DIAGNOSIS — E1122 Type 2 diabetes mellitus with diabetic chronic kidney disease: Secondary | ICD-10-CM | POA: Diagnosis not present

## 2020-07-07 DIAGNOSIS — G40209 Localization-related (focal) (partial) symptomatic epilepsy and epileptic syndromes with complex partial seizures, not intractable, without status epilepticus: Secondary | ICD-10-CM | POA: Diagnosis not present

## 2020-07-07 DIAGNOSIS — G936 Cerebral edema: Secondary | ICD-10-CM | POA: Diagnosis not present

## 2020-07-07 DIAGNOSIS — N186 End stage renal disease: Secondary | ICD-10-CM | POA: Diagnosis not present

## 2020-07-07 DIAGNOSIS — M7989 Other specified soft tissue disorders: Secondary | ICD-10-CM | POA: Diagnosis not present

## 2020-07-07 DIAGNOSIS — S065X0A Traumatic subdural hemorrhage without loss of consciousness, initial encounter: Secondary | ICD-10-CM | POA: Diagnosis not present

## 2020-07-07 DIAGNOSIS — F1010 Alcohol abuse, uncomplicated: Secondary | ICD-10-CM | POA: Diagnosis not present

## 2020-07-07 DIAGNOSIS — G935 Compression of brain: Secondary | ICD-10-CM | POA: Diagnosis not present

## 2020-07-07 DIAGNOSIS — R519 Headache, unspecified: Secondary | ICD-10-CM | POA: Diagnosis not present

## 2020-07-07 DIAGNOSIS — J811 Chronic pulmonary edema: Secondary | ICD-10-CM | POA: Diagnosis not present

## 2020-07-07 DIAGNOSIS — S72141A Displaced intertrochanteric fracture of right femur, initial encounter for closed fracture: Secondary | ICD-10-CM | POA: Diagnosis not present

## 2020-07-07 DIAGNOSIS — I132 Hypertensive heart and chronic kidney disease with heart failure and with stage 5 chronic kidney disease, or end stage renal disease: Secondary | ICD-10-CM | POA: Diagnosis not present

## 2020-07-07 DIAGNOSIS — I7789 Other specified disorders of arteries and arterioles: Secondary | ICD-10-CM | POA: Diagnosis not present

## 2020-07-07 DIAGNOSIS — R079 Chest pain, unspecified: Secondary | ICD-10-CM | POA: Diagnosis not present

## 2020-07-07 DIAGNOSIS — J90 Pleural effusion, not elsewhere classified: Secondary | ICD-10-CM | POA: Diagnosis not present

## 2020-07-07 DIAGNOSIS — I739 Peripheral vascular disease, unspecified: Secondary | ICD-10-CM | POA: Diagnosis not present

## 2020-07-07 DIAGNOSIS — Z4682 Encounter for fitting and adjustment of non-vascular catheter: Secondary | ICD-10-CM | POA: Diagnosis not present

## 2020-07-07 DIAGNOSIS — S065X9D Traumatic subdural hemorrhage with loss of consciousness of unspecified duration, subsequent encounter: Secondary | ICD-10-CM | POA: Diagnosis not present

## 2020-07-07 DIAGNOSIS — M9701XA Periprosthetic fracture around internal prosthetic right hip joint, initial encounter: Secondary | ICD-10-CM | POA: Diagnosis not present

## 2020-07-07 DIAGNOSIS — I517 Cardiomegaly: Secondary | ICD-10-CM | POA: Diagnosis not present

## 2020-07-07 DIAGNOSIS — K766 Portal hypertension: Secondary | ICD-10-CM | POA: Diagnosis not present

## 2020-07-07 DIAGNOSIS — R4182 Altered mental status, unspecified: Secondary | ICD-10-CM | POA: Diagnosis not present

## 2020-07-07 DIAGNOSIS — I4510 Unspecified right bundle-branch block: Secondary | ICD-10-CM | POA: Diagnosis not present

## 2020-07-07 DIAGNOSIS — I361 Nonrheumatic tricuspid (valve) insufficiency: Secondary | ICD-10-CM | POA: Diagnosis not present

## 2020-07-07 DIAGNOSIS — S12601A Unspecified nondisplaced fracture of seventh cervical vertebra, initial encounter for closed fracture: Secondary | ICD-10-CM | POA: Diagnosis not present

## 2020-07-07 DIAGNOSIS — E119 Type 2 diabetes mellitus without complications: Secondary | ICD-10-CM | POA: Diagnosis not present

## 2020-07-07 DIAGNOSIS — R109 Unspecified abdominal pain: Secondary | ICD-10-CM | POA: Diagnosis not present

## 2020-07-07 DIAGNOSIS — I272 Pulmonary hypertension, unspecified: Secondary | ICD-10-CM | POA: Diagnosis not present

## 2020-07-07 DIAGNOSIS — K7460 Unspecified cirrhosis of liver: Secondary | ICD-10-CM | POA: Diagnosis not present

## 2020-07-07 DIAGNOSIS — F10129 Alcohol abuse with intoxication, unspecified: Secondary | ICD-10-CM | POA: Diagnosis not present

## 2020-07-08 DIAGNOSIS — I272 Pulmonary hypertension, unspecified: Secondary | ICD-10-CM | POA: Diagnosis not present

## 2020-07-08 DIAGNOSIS — I361 Nonrheumatic tricuspid (valve) insufficiency: Secondary | ICD-10-CM | POA: Diagnosis not present

## 2020-07-08 DIAGNOSIS — S065X9A Traumatic subdural hemorrhage with loss of consciousness of unspecified duration, initial encounter: Secondary | ICD-10-CM | POA: Diagnosis not present

## 2020-07-08 DIAGNOSIS — N186 End stage renal disease: Secondary | ICD-10-CM | POA: Diagnosis not present

## 2020-07-08 DIAGNOSIS — R9431 Abnormal electrocardiogram [ECG] [EKG]: Secondary | ICD-10-CM | POA: Diagnosis not present

## 2020-07-08 DIAGNOSIS — S065X0A Traumatic subdural hemorrhage without loss of consciousness, initial encounter: Secondary | ICD-10-CM | POA: Diagnosis not present

## 2020-07-08 DIAGNOSIS — J810 Acute pulmonary edema: Secondary | ICD-10-CM | POA: Diagnosis not present

## 2020-07-09 DIAGNOSIS — N186 End stage renal disease: Secondary | ICD-10-CM | POA: Diagnosis not present

## 2020-07-09 DIAGNOSIS — J810 Acute pulmonary edema: Secondary | ICD-10-CM | POA: Diagnosis not present

## 2020-07-09 DIAGNOSIS — G40209 Localization-related (focal) (partial) symptomatic epilepsy and epileptic syndromes with complex partial seizures, not intractable, without status epilepticus: Secondary | ICD-10-CM | POA: Diagnosis not present

## 2020-07-09 DIAGNOSIS — S065X0A Traumatic subdural hemorrhage without loss of consciousness, initial encounter: Secondary | ICD-10-CM | POA: Diagnosis not present

## 2020-07-10 DIAGNOSIS — S065X9A Traumatic subdural hemorrhage with loss of consciousness of unspecified duration, initial encounter: Secondary | ICD-10-CM | POA: Diagnosis not present

## 2020-07-10 DIAGNOSIS — S06360A Traumatic hemorrhage of cerebrum, unspecified, without loss of consciousness, initial encounter: Secondary | ICD-10-CM | POA: Diagnosis not present

## 2020-07-10 DIAGNOSIS — S065X9D Traumatic subdural hemorrhage with loss of consciousness of unspecified duration, subsequent encounter: Secondary | ICD-10-CM | POA: Diagnosis not present

## 2020-07-10 DIAGNOSIS — G936 Cerebral edema: Secondary | ICD-10-CM | POA: Diagnosis not present

## 2020-07-10 DIAGNOSIS — G40209 Localization-related (focal) (partial) symptomatic epilepsy and epileptic syndromes with complex partial seizures, not intractable, without status epilepticus: Secondary | ICD-10-CM | POA: Diagnosis not present

## 2020-07-11 DIAGNOSIS — G40209 Localization-related (focal) (partial) symptomatic epilepsy and epileptic syndromes with complex partial seizures, not intractable, without status epilepticus: Secondary | ICD-10-CM | POA: Diagnosis not present

## 2020-07-11 DIAGNOSIS — I120 Hypertensive chronic kidney disease with stage 5 chronic kidney disease or end stage renal disease: Secondary | ICD-10-CM | POA: Diagnosis not present

## 2020-07-11 DIAGNOSIS — Z4682 Encounter for fitting and adjustment of non-vascular catheter: Secondary | ICD-10-CM | POA: Diagnosis not present

## 2020-07-11 DIAGNOSIS — N186 End stage renal disease: Secondary | ICD-10-CM | POA: Diagnosis not present

## 2020-07-12 DIAGNOSIS — R0602 Shortness of breath: Secondary | ICD-10-CM | POA: Diagnosis not present

## 2020-07-12 DIAGNOSIS — G40209 Localization-related (focal) (partial) symptomatic epilepsy and epileptic syndromes with complex partial seizures, not intractable, without status epilepticus: Secondary | ICD-10-CM | POA: Diagnosis not present

## 2020-07-12 DIAGNOSIS — N186 End stage renal disease: Secondary | ICD-10-CM | POA: Diagnosis not present

## 2020-07-12 DIAGNOSIS — Z992 Dependence on renal dialysis: Secondary | ICD-10-CM | POA: Diagnosis not present

## 2020-07-12 DIAGNOSIS — J811 Chronic pulmonary edema: Secondary | ICD-10-CM | POA: Diagnosis not present

## 2020-07-12 DIAGNOSIS — J90 Pleural effusion, not elsewhere classified: Secondary | ICD-10-CM | POA: Diagnosis not present

## 2020-07-12 DIAGNOSIS — I120 Hypertensive chronic kidney disease with stage 5 chronic kidney disease or end stage renal disease: Secondary | ICD-10-CM | POA: Diagnosis not present

## 2020-07-13 DIAGNOSIS — Z4682 Encounter for fitting and adjustment of non-vascular catheter: Secondary | ICD-10-CM | POA: Diagnosis not present

## 2020-07-13 DIAGNOSIS — N186 End stage renal disease: Secondary | ICD-10-CM | POA: Diagnosis not present

## 2020-07-13 DIAGNOSIS — G40209 Localization-related (focal) (partial) symptomatic epilepsy and epileptic syndromes with complex partial seizures, not intractable, without status epilepticus: Secondary | ICD-10-CM | POA: Diagnosis not present

## 2020-07-13 DIAGNOSIS — Z515 Encounter for palliative care: Secondary | ICD-10-CM | POA: Diagnosis not present

## 2020-07-13 DIAGNOSIS — I120 Hypertensive chronic kidney disease with stage 5 chronic kidney disease or end stage renal disease: Secondary | ICD-10-CM | POA: Diagnosis not present

## 2020-07-14 DIAGNOSIS — N186 End stage renal disease: Secondary | ICD-10-CM | POA: Diagnosis not present

## 2020-07-14 DIAGNOSIS — G40209 Localization-related (focal) (partial) symptomatic epilepsy and epileptic syndromes with complex partial seizures, not intractable, without status epilepticus: Secondary | ICD-10-CM | POA: Diagnosis not present

## 2020-07-14 DIAGNOSIS — I120 Hypertensive chronic kidney disease with stage 5 chronic kidney disease or end stage renal disease: Secondary | ICD-10-CM | POA: Diagnosis not present

## 2020-07-15 DIAGNOSIS — Z4682 Encounter for fitting and adjustment of non-vascular catheter: Secondary | ICD-10-CM | POA: Diagnosis not present

## 2020-07-15 DIAGNOSIS — I120 Hypertensive chronic kidney disease with stage 5 chronic kidney disease or end stage renal disease: Secondary | ICD-10-CM | POA: Diagnosis not present

## 2020-07-15 DIAGNOSIS — R000 Tachycardia, unspecified: Secondary | ICD-10-CM | POA: Diagnosis not present

## 2020-07-15 DIAGNOSIS — I4891 Unspecified atrial fibrillation: Secondary | ICD-10-CM | POA: Diagnosis not present

## 2020-07-15 DIAGNOSIS — N186 End stage renal disease: Secondary | ICD-10-CM | POA: Diagnosis not present

## 2020-07-15 DIAGNOSIS — R9431 Abnormal electrocardiogram [ECG] [EKG]: Secondary | ICD-10-CM | POA: Diagnosis not present

## 2020-07-15 DIAGNOSIS — I4510 Unspecified right bundle-branch block: Secondary | ICD-10-CM | POA: Diagnosis not present

## 2020-07-16 DIAGNOSIS — N186 End stage renal disease: Secondary | ICD-10-CM | POA: Diagnosis not present

## 2020-07-16 DIAGNOSIS — I120 Hypertensive chronic kidney disease with stage 5 chronic kidney disease or end stage renal disease: Secondary | ICD-10-CM | POA: Diagnosis not present

## 2020-07-17 DIAGNOSIS — M542 Cervicalgia: Secondary | ICD-10-CM | POA: Diagnosis not present

## 2020-07-18 DIAGNOSIS — J810 Acute pulmonary edema: Secondary | ICD-10-CM | POA: Diagnosis not present

## 2020-07-18 DIAGNOSIS — N186 End stage renal disease: Secondary | ICD-10-CM | POA: Diagnosis not present

## 2020-07-19 DIAGNOSIS — N186 End stage renal disease: Secondary | ICD-10-CM | POA: Diagnosis not present

## 2020-07-19 DIAGNOSIS — Z515 Encounter for palliative care: Secondary | ICD-10-CM | POA: Diagnosis not present

## 2020-07-19 DIAGNOSIS — J810 Acute pulmonary edema: Secondary | ICD-10-CM | POA: Diagnosis not present

## 2020-07-19 DIAGNOSIS — R4182 Altered mental status, unspecified: Secondary | ICD-10-CM | POA: Diagnosis not present

## 2020-07-19 DIAGNOSIS — R569 Unspecified convulsions: Secondary | ICD-10-CM | POA: Diagnosis not present

## 2020-07-19 DIAGNOSIS — J9602 Acute respiratory failure with hypercapnia: Secondary | ICD-10-CM | POA: Diagnosis not present

## 2020-07-20 DIAGNOSIS — N186 End stage renal disease: Secondary | ICD-10-CM | POA: Diagnosis not present

## 2020-07-20 DIAGNOSIS — J810 Acute pulmonary edema: Secondary | ICD-10-CM | POA: Diagnosis not present

## 2020-07-21 DIAGNOSIS — J810 Acute pulmonary edema: Secondary | ICD-10-CM | POA: Diagnosis not present

## 2020-07-21 DIAGNOSIS — N186 End stage renal disease: Secondary | ICD-10-CM | POA: Diagnosis not present

## 2020-08-06 DIAGNOSIS — Z9181 History of falling: Secondary | ICD-10-CM | POA: Diagnosis not present

## 2020-08-06 DIAGNOSIS — E1142 Type 2 diabetes mellitus with diabetic polyneuropathy: Secondary | ICD-10-CM | POA: Diagnosis not present

## 2020-08-12 DEATH — deceased

## 2023-03-28 IMAGING — DX DG CHEST 1V PORT
1 series · 1 of 1 positions shown · non-contrast
Comparison: 04/29/2020

CLINICAL DATA: Altered mental status

EXAM:
PORTABLE CHEST 1 VIEW

[chest ap]
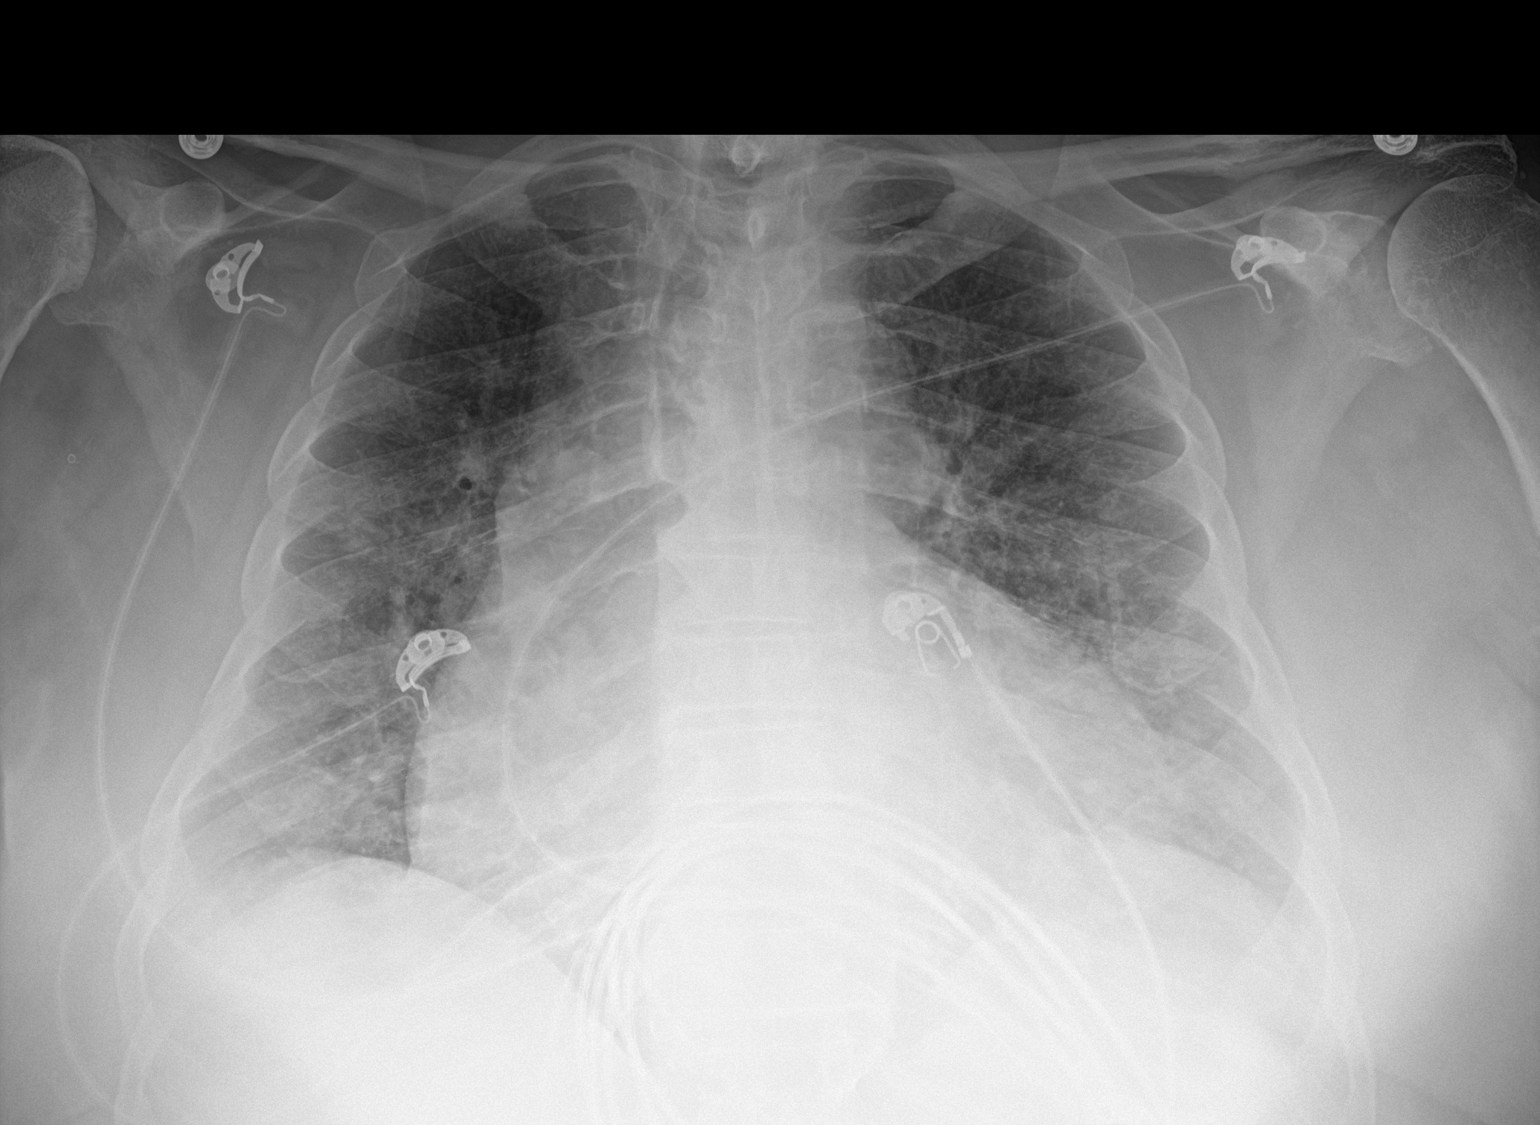

[1 of 1 positions shown; findings below may reference images not displayed]

FINDINGS: Cardiomegaly, vascular congestion. Diffuse interstitial prominence
may reflect interstitial edema. Suspect small layering effusions. No
acute bony abnormality.
IMPRESSION: Mild interstitial edema/CHF.  Suspect small layering effusions.
